# Patient Record
Sex: Female | Born: 1950
Health system: Southern US, Community
[De-identification: ages and names within clinical notes are randomized; demographics above are authoritative.]

## PROBLEM LIST (undated history)

## (undated) DIAGNOSIS — K449 Diaphragmatic hernia without obstruction or gangrene: Secondary | ICD-10-CM

## (undated) DIAGNOSIS — Z9989 Dependence on other enabling machines and devices: Secondary | ICD-10-CM

## (undated) DIAGNOSIS — I428 Other cardiomyopathies: Secondary | ICD-10-CM

## (undated) DIAGNOSIS — R55 Syncope and collapse: Secondary | ICD-10-CM

## (undated) DIAGNOSIS — I872 Venous insufficiency (chronic) (peripheral): Secondary | ICD-10-CM

## (undated) DIAGNOSIS — G43909 Migraine, unspecified, not intractable, without status migrainosus: Secondary | ICD-10-CM

## (undated) DIAGNOSIS — J101 Influenza due to other identified influenza virus with other respiratory manifestations: Secondary | ICD-10-CM

## (undated) DIAGNOSIS — L02419 Cutaneous abscess of limb, unspecified: Secondary | ICD-10-CM

## (undated) DIAGNOSIS — Z9289 Personal history of other medical treatment: Secondary | ICD-10-CM

## (undated) DIAGNOSIS — I495 Sick sinus syndrome: Secondary | ICD-10-CM

## (undated) DIAGNOSIS — L0390 Cellulitis, unspecified: Secondary | ICD-10-CM

## (undated) DIAGNOSIS — R011 Cardiac murmur, unspecified: Secondary | ICD-10-CM

## (undated) DIAGNOSIS — E7800 Pure hypercholesterolemia, unspecified: Secondary | ICD-10-CM

## (undated) DIAGNOSIS — R0600 Dyspnea, unspecified: Secondary | ICD-10-CM

## (undated) DIAGNOSIS — R569 Unspecified convulsions: Secondary | ICD-10-CM

## (undated) DIAGNOSIS — R5383 Other fatigue: Secondary | ICD-10-CM

## (undated) DIAGNOSIS — I10 Essential (primary) hypertension: Secondary | ICD-10-CM

## (undated) DIAGNOSIS — I4891 Unspecified atrial fibrillation: Secondary | ICD-10-CM

## (undated) DIAGNOSIS — Z950 Presence of cardiac pacemaker: Secondary | ICD-10-CM

## (undated) DIAGNOSIS — K219 Gastro-esophageal reflux disease without esophagitis: Secondary | ICD-10-CM

## (undated) DIAGNOSIS — R0609 Other forms of dyspnea: Secondary | ICD-10-CM

## (undated) DIAGNOSIS — M1990 Unspecified osteoarthritis, unspecified site: Secondary | ICD-10-CM

## (undated) DIAGNOSIS — L280 Lichen simplex chronicus: Secondary | ICD-10-CM

## (undated) DIAGNOSIS — Z8489 Family history of other specified conditions: Secondary | ICD-10-CM

## (undated) DIAGNOSIS — I509 Heart failure, unspecified: Secondary | ICD-10-CM

## (undated) DIAGNOSIS — J189 Pneumonia, unspecified organism: Secondary | ICD-10-CM

## (undated) DIAGNOSIS — Z8719 Personal history of other diseases of the digestive system: Secondary | ICD-10-CM

## (undated) DIAGNOSIS — Z9981 Dependence on supplemental oxygen: Secondary | ICD-10-CM

## (undated) DIAGNOSIS — N393 Stress incontinence (female) (male): Secondary | ICD-10-CM

## (undated) DIAGNOSIS — G40409 Other generalized epilepsy and epileptic syndromes, not intractable, without status epilepticus: Secondary | ICD-10-CM

## (undated) DIAGNOSIS — R0602 Shortness of breath: Secondary | ICD-10-CM

## (undated) DIAGNOSIS — I639 Cerebral infarction, unspecified: Secondary | ICD-10-CM

## (undated) DIAGNOSIS — J309 Allergic rhinitis, unspecified: Secondary | ICD-10-CM

## (undated) DIAGNOSIS — G4733 Obstructive sleep apnea (adult) (pediatric): Secondary | ICD-10-CM

## (undated) DIAGNOSIS — D649 Anemia, unspecified: Secondary | ICD-10-CM

## (undated) DIAGNOSIS — L03119 Cellulitis of unspecified part of limb: Secondary | ICD-10-CM

## (undated) DIAGNOSIS — R930 Abnormal findings on diagnostic imaging of skull and head, not elsewhere classified: Secondary | ICD-10-CM

## (undated) DIAGNOSIS — R4182 Altered mental status, unspecified: Secondary | ICD-10-CM

## (undated) DIAGNOSIS — R400 Somnolence: Secondary | ICD-10-CM

## (undated) HISTORY — DX: Shortness of breath: R06.02

## (undated) HISTORY — DX: Gastro-esophageal reflux disease without esophagitis: K21.9

## (undated) HISTORY — DX: Sick sinus syndrome: I49.5

## (undated) HISTORY — DX: Diaphragmatic hernia without obstruction or gangrene: K44.9

## (undated) HISTORY — DX: Altered mental status, unspecified: R41.82

## (undated) HISTORY — DX: Cellulitis, unspecified: L03.90

## (undated) HISTORY — DX: Morbid (severe) obesity due to excess calories: E66.01

## (undated) HISTORY — DX: Somnolence: R40.0

## (undated) HISTORY — DX: Abnormal findings on diagnostic imaging of skull and head, not elsewhere classified: R93.0

## (undated) HISTORY — DX: Other cardiomyopathies: I42.8

## (undated) HISTORY — DX: Stress incontinence (female) (male): N39.3

## (undated) HISTORY — PX: VENTRAL HERNIA REPAIR: SHX424

## (undated) HISTORY — PX: HERNIA REPAIR: SHX51

## (undated) HISTORY — PX: APPENDECTOMY: SHX54

## (undated) HISTORY — DX: Other fatigue: R53.83

## (undated) HISTORY — DX: Venous insufficiency (chronic) (peripheral): I87.2

## (undated) HISTORY — DX: Lichen simplex chronicus: L28.0

## (undated) HISTORY — DX: Allergic rhinitis, unspecified: J30.9

## (undated) HISTORY — DX: Essential (primary) hypertension: I10

---
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
## 1984-12-16 DIAGNOSIS — R930 Abnormal findings on diagnostic imaging of skull and head, not elsewhere classified: Secondary | ICD-10-CM

## 1984-12-16 HISTORY — DX: Abnormal findings on diagnostic imaging of skull and head, not elsewhere classified: R93.0

## 1986-12-16 DIAGNOSIS — I639 Cerebral infarction, unspecified: Secondary | ICD-10-CM

## 1986-12-16 HISTORY — DX: Cerebral infarction, unspecified: I63.9

## 1990-12-16 HISTORY — PX: INSERT / REPLACE / REMOVE PACEMAKER: SUR710

## 1998-04-25 ENCOUNTER — Encounter: Admission: RE | Admit: 1998-04-25 | Discharge: 1998-04-25 | Payer: Self-pay | Admitting: Family Medicine

## 1998-05-28 ENCOUNTER — Emergency Department (HOSPITAL_COMMUNITY): Admission: EM | Admit: 1998-05-28 | Discharge: 1998-05-28 | Payer: Self-pay | Admitting: Emergency Medicine

## 1998-06-06 ENCOUNTER — Encounter: Admission: RE | Admit: 1998-06-06 | Discharge: 1998-06-06 | Payer: Self-pay | Admitting: Family Medicine

## 1998-09-05 ENCOUNTER — Encounter: Admission: RE | Admit: 1998-09-05 | Discharge: 1998-09-05 | Payer: Self-pay | Admitting: Family Medicine

## 1998-10-30 ENCOUNTER — Encounter: Admission: RE | Admit: 1998-10-30 | Discharge: 1998-10-30 | Payer: Self-pay | Admitting: Family Medicine

## 1998-11-13 ENCOUNTER — Encounter: Admission: RE | Admit: 1998-11-13 | Discharge: 1998-11-13 | Payer: Self-pay | Admitting: Family Medicine

## 1998-11-28 ENCOUNTER — Encounter: Admission: RE | Admit: 1998-11-28 | Discharge: 1998-11-28 | Payer: Self-pay | Admitting: Family Medicine

## 1999-03-13 ENCOUNTER — Encounter: Admission: RE | Admit: 1999-03-13 | Discharge: 1999-03-13 | Payer: Self-pay | Admitting: Family Medicine

## 1999-04-10 ENCOUNTER — Encounter: Admission: RE | Admit: 1999-04-10 | Discharge: 1999-04-10 | Payer: Self-pay | Admitting: Family Medicine

## 1999-04-16 ENCOUNTER — Encounter: Admission: RE | Admit: 1999-04-16 | Discharge: 1999-07-15 | Payer: Self-pay | Admitting: Family Medicine

## 1999-08-21 ENCOUNTER — Encounter: Admission: RE | Admit: 1999-08-21 | Discharge: 1999-08-21 | Payer: Self-pay | Admitting: Family Medicine

## 1999-11-20 ENCOUNTER — Encounter: Admission: RE | Admit: 1999-11-20 | Discharge: 1999-11-20 | Payer: Self-pay | Admitting: Family Medicine

## 1999-11-20 ENCOUNTER — Ambulatory Visit (HOSPITAL_COMMUNITY): Admission: RE | Admit: 1999-11-20 | Discharge: 1999-11-20 | Payer: Self-pay | Admitting: Family Medicine

## 1999-12-04 ENCOUNTER — Encounter: Admission: RE | Admit: 1999-12-04 | Discharge: 1999-12-04 | Payer: Self-pay | Admitting: Family Medicine

## 1999-12-18 ENCOUNTER — Encounter: Admission: RE | Admit: 1999-12-18 | Discharge: 1999-12-18 | Payer: Self-pay | Admitting: Family Medicine

## 1999-12-31 ENCOUNTER — Encounter: Payer: Self-pay | Admitting: Family Medicine

## 1999-12-31 ENCOUNTER — Encounter: Admission: RE | Admit: 1999-12-31 | Discharge: 1999-12-31 | Payer: Self-pay | Admitting: Family Medicine

## 2000-05-27 ENCOUNTER — Encounter: Admission: RE | Admit: 2000-05-27 | Discharge: 2000-05-27 | Payer: Self-pay | Admitting: Family Medicine

## 2000-07-16 ENCOUNTER — Encounter: Admission: RE | Admit: 2000-07-16 | Discharge: 2000-07-16 | Payer: Self-pay | Admitting: Family Medicine

## 2000-11-04 ENCOUNTER — Encounter: Admission: RE | Admit: 2000-11-04 | Discharge: 2000-11-04 | Payer: Self-pay | Admitting: Sports Medicine

## 2001-03-03 ENCOUNTER — Encounter: Admission: RE | Admit: 2001-03-03 | Discharge: 2001-03-03 | Payer: Self-pay | Admitting: Family Medicine

## 2001-05-16 HISTORY — PX: LEG SKIN LESION  BIOPSY / EXCISION: SUR473

## 2001-05-19 ENCOUNTER — Encounter: Admission: RE | Admit: 2001-05-19 | Discharge: 2001-05-19 | Payer: Self-pay | Admitting: Family Medicine

## 2001-06-15 ENCOUNTER — Encounter: Admission: RE | Admit: 2001-06-15 | Discharge: 2001-06-15 | Payer: Self-pay | Admitting: Family Medicine

## 2001-06-15 ENCOUNTER — Encounter: Payer: Self-pay | Admitting: Family Medicine

## 2001-06-16 ENCOUNTER — Encounter: Admission: RE | Admit: 2001-06-16 | Discharge: 2001-06-16 | Payer: Self-pay | Admitting: Family Medicine

## 2001-08-04 ENCOUNTER — Encounter: Payer: Self-pay | Admitting: Family Medicine

## 2001-08-04 ENCOUNTER — Encounter: Admission: RE | Admit: 2001-08-04 | Discharge: 2001-08-04 | Payer: Self-pay | Admitting: Family Medicine

## 2001-09-01 ENCOUNTER — Encounter: Admission: RE | Admit: 2001-09-01 | Discharge: 2001-09-01 | Payer: Self-pay | Admitting: Family Medicine

## 2001-12-29 ENCOUNTER — Encounter: Admission: RE | Admit: 2001-12-29 | Discharge: 2001-12-29 | Payer: Self-pay | Admitting: Family Medicine

## 2002-01-12 ENCOUNTER — Encounter: Admission: RE | Admit: 2002-01-12 | Discharge: 2002-01-12 | Payer: Self-pay | Admitting: Family Medicine

## 2002-01-26 ENCOUNTER — Encounter: Admission: RE | Admit: 2002-01-26 | Discharge: 2002-01-26 | Payer: Self-pay | Admitting: Family Medicine

## 2002-02-09 ENCOUNTER — Encounter: Admission: RE | Admit: 2002-02-09 | Discharge: 2002-02-09 | Payer: Self-pay | Admitting: Family Medicine

## 2002-03-23 ENCOUNTER — Encounter: Admission: RE | Admit: 2002-03-23 | Discharge: 2002-03-23 | Payer: Self-pay | Admitting: Family Medicine

## 2002-04-20 ENCOUNTER — Encounter: Admission: RE | Admit: 2002-04-20 | Discharge: 2002-04-20 | Payer: Self-pay | Admitting: Family Medicine

## 2002-06-22 ENCOUNTER — Encounter: Admission: RE | Admit: 2002-06-22 | Discharge: 2002-06-22 | Payer: Self-pay | Admitting: Family Medicine

## 2002-09-16 ENCOUNTER — Emergency Department (HOSPITAL_COMMUNITY): Admission: EM | Admit: 2002-09-16 | Discharge: 2002-09-17 | Payer: Self-pay | Admitting: Emergency Medicine

## 2002-09-16 ENCOUNTER — Encounter: Payer: Self-pay | Admitting: Emergency Medicine

## 2002-11-30 ENCOUNTER — Encounter: Admission: RE | Admit: 2002-11-30 | Discharge: 2002-11-30 | Payer: Self-pay | Admitting: Family Medicine

## 2002-12-28 ENCOUNTER — Encounter: Admission: RE | Admit: 2002-12-28 | Discharge: 2002-12-28 | Payer: Self-pay | Admitting: Family Medicine

## 2002-12-28 ENCOUNTER — Encounter: Payer: Self-pay | Admitting: Family Medicine

## 2003-02-16 ENCOUNTER — Emergency Department (HOSPITAL_COMMUNITY): Admission: EM | Admit: 2003-02-16 | Discharge: 2003-02-16 | Payer: Self-pay | Admitting: Emergency Medicine

## 2003-02-16 ENCOUNTER — Encounter: Payer: Self-pay | Admitting: Emergency Medicine

## 2003-03-01 ENCOUNTER — Encounter: Admission: RE | Admit: 2003-03-01 | Discharge: 2003-03-01 | Payer: Self-pay | Admitting: Family Medicine

## 2003-04-19 ENCOUNTER — Encounter: Admission: RE | Admit: 2003-04-19 | Discharge: 2003-04-19 | Payer: Self-pay | Admitting: Family Medicine

## 2003-07-20 ENCOUNTER — Ambulatory Visit (HOSPITAL_COMMUNITY): Admission: RE | Admit: 2003-07-20 | Discharge: 2003-07-20 | Payer: Self-pay | Admitting: Cardiology

## 2003-07-20 ENCOUNTER — Encounter: Payer: Self-pay | Admitting: Cardiology

## 2003-07-25 HISTORY — PX: INSERT / REPLACE / REMOVE PACEMAKER: SUR710

## 2003-08-09 ENCOUNTER — Encounter: Admission: RE | Admit: 2003-08-09 | Discharge: 2003-08-09 | Payer: Self-pay | Admitting: Family Medicine

## 2003-11-29 ENCOUNTER — Encounter: Admission: RE | Admit: 2003-11-29 | Discharge: 2003-11-29 | Payer: Self-pay | Admitting: Family Medicine

## 2004-02-02 ENCOUNTER — Encounter: Admission: RE | Admit: 2004-02-02 | Discharge: 2004-02-02 | Payer: Self-pay | Admitting: Sports Medicine

## 2004-02-21 ENCOUNTER — Encounter: Admission: RE | Admit: 2004-02-21 | Discharge: 2004-02-21 | Payer: Self-pay | Admitting: Family Medicine

## 2004-03-12 ENCOUNTER — Encounter: Admission: RE | Admit: 2004-03-12 | Discharge: 2004-03-12 | Payer: Self-pay | Admitting: Sports Medicine

## 2004-04-03 ENCOUNTER — Encounter: Admission: RE | Admit: 2004-04-03 | Discharge: 2004-04-03 | Payer: Self-pay | Admitting: Family Medicine

## 2004-07-17 ENCOUNTER — Encounter: Admission: RE | Admit: 2004-07-17 | Discharge: 2004-07-17 | Payer: Self-pay | Admitting: Family Medicine

## 2004-10-16 ENCOUNTER — Encounter (INDEPENDENT_AMBULATORY_CARE_PROVIDER_SITE_OTHER): Payer: Self-pay | Admitting: *Deleted

## 2004-10-16 LAB — CONVERTED CEMR LAB

## 2004-10-30 ENCOUNTER — Encounter: Admission: RE | Admit: 2004-10-30 | Discharge: 2004-10-30 | Payer: Self-pay | Admitting: Family Medicine

## 2004-10-30 ENCOUNTER — Ambulatory Visit: Payer: Self-pay | Admitting: Family Medicine

## 2004-10-30 ENCOUNTER — Encounter: Payer: Self-pay | Admitting: Family Medicine

## 2004-11-13 ENCOUNTER — Ambulatory Visit: Payer: Self-pay | Admitting: Family Medicine

## 2004-11-20 ENCOUNTER — Ambulatory Visit: Payer: Self-pay | Admitting: Sports Medicine

## 2004-12-04 ENCOUNTER — Ambulatory Visit: Payer: Self-pay | Admitting: Family Medicine

## 2005-01-12 ENCOUNTER — Ambulatory Visit: Payer: Self-pay | Admitting: Cardiology

## 2005-02-05 ENCOUNTER — Ambulatory Visit: Payer: Self-pay | Admitting: Family Medicine

## 2005-03-05 ENCOUNTER — Ambulatory Visit: Payer: Self-pay | Admitting: Family Medicine

## 2005-03-21 ENCOUNTER — Ambulatory Visit: Payer: Self-pay | Admitting: Cardiology

## 2005-03-27 ENCOUNTER — Ambulatory Visit (HOSPITAL_COMMUNITY): Admission: RE | Admit: 2005-03-27 | Discharge: 2005-03-27 | Payer: Self-pay | Admitting: *Deleted

## 2005-06-11 ENCOUNTER — Ambulatory Visit: Payer: Self-pay | Admitting: Family Medicine

## 2005-06-26 ENCOUNTER — Encounter: Admission: RE | Admit: 2005-06-26 | Discharge: 2005-06-26 | Payer: Self-pay | Admitting: Family Medicine

## 2005-07-18 ENCOUNTER — Ambulatory Visit: Payer: Self-pay | Admitting: Cardiology

## 2005-07-31 ENCOUNTER — Ambulatory Visit: Payer: Self-pay

## 2005-08-06 ENCOUNTER — Ambulatory Visit: Payer: Self-pay | Admitting: Cardiology

## 2005-08-20 ENCOUNTER — Ambulatory Visit: Payer: Self-pay | Admitting: Family Medicine

## 2005-09-10 ENCOUNTER — Ambulatory Visit: Payer: Self-pay | Admitting: Sports Medicine

## 2005-10-08 ENCOUNTER — Ambulatory Visit: Payer: Self-pay | Admitting: Family Medicine

## 2006-01-07 ENCOUNTER — Ambulatory Visit: Payer: Self-pay | Admitting: Family Medicine

## 2006-03-11 ENCOUNTER — Ambulatory Visit: Payer: Self-pay | Admitting: Family Medicine

## 2006-04-29 ENCOUNTER — Ambulatory Visit: Payer: Self-pay | Admitting: Family Medicine

## 2006-07-16 ENCOUNTER — Ambulatory Visit: Payer: Self-pay | Admitting: Cardiology

## 2006-08-05 ENCOUNTER — Ambulatory Visit: Payer: Self-pay | Admitting: Family Medicine

## 2006-08-26 ENCOUNTER — Ambulatory Visit: Payer: Self-pay | Admitting: Family Medicine

## 2006-09-02 ENCOUNTER — Encounter: Admission: RE | Admit: 2006-09-02 | Discharge: 2006-09-02 | Payer: Self-pay | Admitting: Family Medicine

## 2006-09-30 ENCOUNTER — Ambulatory Visit: Payer: Self-pay | Admitting: Family Medicine

## 2006-11-04 ENCOUNTER — Ambulatory Visit: Payer: Self-pay | Admitting: Family Medicine

## 2006-12-02 ENCOUNTER — Ambulatory Visit: Payer: Self-pay | Admitting: Family Medicine

## 2007-01-22 ENCOUNTER — Ambulatory Visit: Payer: Self-pay | Admitting: Cardiology

## 2007-02-03 ENCOUNTER — Encounter: Payer: Self-pay | Admitting: Cardiology

## 2007-02-03 ENCOUNTER — Ambulatory Visit: Payer: Self-pay | Admitting: Cardiology

## 2007-02-03 ENCOUNTER — Ambulatory Visit: Payer: Self-pay

## 2007-02-03 LAB — CONVERTED CEMR LAB
Basophils Relative: 1.7 % — ABNORMAL HIGH (ref 0.0–1.0)
CO2: 30 meq/L (ref 19–32)
Calcium: 8.9 mg/dL (ref 8.4–10.5)
Chloride: 100 meq/L (ref 96–112)
Creatinine, Ser: 0.8 mg/dL (ref 0.4–1.2)
Eosinophils Relative: 0.6 % (ref 0.0–5.0)
Glucose, Bld: 111 mg/dL — ABNORMAL HIGH (ref 70–99)
HCT: 37.8 % (ref 36.0–46.0)
MCV: 91 fL (ref 78.0–100.0)
Neutrophils Relative %: 69.1 % (ref 43.0–77.0)
Platelets: 128 10*3/uL — ABNORMAL LOW (ref 150–400)
RBC: 4.15 M/uL (ref 3.87–5.11)
RDW: 13.9 % (ref 11.5–14.6)
Sodium: 137 meq/L (ref 135–145)
WBC: 8.8 10*3/uL (ref 4.5–10.5)

## 2007-02-10 ENCOUNTER — Ambulatory Visit: Payer: Self-pay | Admitting: Family Medicine

## 2007-02-10 LAB — CONVERTED CEMR LAB
BUN: 16 mg/dL (ref 6–23)
CO2: 27 meq/L (ref 19–32)
Chloride: 104 meq/L (ref 96–112)
Creatinine, Ser: 0.57 mg/dL (ref 0.40–1.20)
Potassium: 5.2 meq/L (ref 3.5–5.3)

## 2007-02-12 DIAGNOSIS — G4733 Obstructive sleep apnea (adult) (pediatric): Secondary | ICD-10-CM | POA: Insufficient documentation

## 2007-02-12 DIAGNOSIS — G40909 Epilepsy, unspecified, not intractable, without status epilepticus: Secondary | ICD-10-CM | POA: Insufficient documentation

## 2007-02-12 DIAGNOSIS — K449 Diaphragmatic hernia without obstruction or gangrene: Secondary | ICD-10-CM | POA: Insufficient documentation

## 2007-02-12 DIAGNOSIS — G43909 Migraine, unspecified, not intractable, without status migrainosus: Secondary | ICD-10-CM | POA: Insufficient documentation

## 2007-02-12 DIAGNOSIS — K219 Gastro-esophageal reflux disease without esophagitis: Secondary | ICD-10-CM | POA: Insufficient documentation

## 2007-02-12 DIAGNOSIS — N393 Stress incontinence (female) (male): Secondary | ICD-10-CM | POA: Insufficient documentation

## 2007-02-12 DIAGNOSIS — I5022 Chronic systolic (congestive) heart failure: Secondary | ICD-10-CM | POA: Insufficient documentation

## 2007-02-13 ENCOUNTER — Encounter (INDEPENDENT_AMBULATORY_CARE_PROVIDER_SITE_OTHER): Payer: Self-pay | Admitting: *Deleted

## 2007-05-05 ENCOUNTER — Ambulatory Visit: Payer: Self-pay | Admitting: Family Medicine

## 2007-05-05 DIAGNOSIS — L280 Lichen simplex chronicus: Secondary | ICD-10-CM | POA: Insufficient documentation

## 2007-05-05 LAB — CONVERTED CEMR LAB
CO2: 26 meq/L (ref 19–32)
Calcium: 9.3 mg/dL (ref 8.4–10.5)
Chloride: 106 meq/L (ref 96–112)
Creatinine, Ser: 0.72 mg/dL (ref 0.40–1.20)
Glucose, Bld: 103 mg/dL — ABNORMAL HIGH (ref 70–99)
Sodium: 140 meq/L (ref 135–145)

## 2007-05-06 ENCOUNTER — Telehealth: Payer: Self-pay | Admitting: *Deleted

## 2007-07-07 ENCOUNTER — Ambulatory Visit: Payer: Self-pay | Admitting: Family Medicine

## 2007-07-20 ENCOUNTER — Ambulatory Visit: Payer: Self-pay | Admitting: Cardiology

## 2007-07-20 LAB — CONVERTED CEMR LAB
BUN: 14 mg/dL (ref 6–23)
Basophils Absolute: 0 10*3/uL (ref 0.0–0.1)
Basophils Relative: 0.5 % (ref 0.0–1.0)
CO2: 29 meq/L (ref 19–32)
Calcium: 9.1 mg/dL (ref 8.4–10.5)
GFR calc Af Amer: 111 mL/min
GFR calc non Af Amer: 92 mL/min
Hemoglobin: 13.2 g/dL (ref 12.0–15.0)
Lymphocytes Relative: 20.2 % (ref 12.0–46.0)
MCHC: 34.5 g/dL (ref 30.0–36.0)
Monocytes Absolute: 0.7 10*3/uL (ref 0.2–0.7)
Monocytes Relative: 9.3 % (ref 3.0–11.0)
Neutro Abs: 4.8 10*3/uL (ref 1.4–7.7)
Platelets: 137 10*3/uL — ABNORMAL LOW (ref 150–400)
Potassium: 4.2 meq/L (ref 3.5–5.1)

## 2007-08-18 ENCOUNTER — Emergency Department (HOSPITAL_COMMUNITY): Admission: EM | Admit: 2007-08-18 | Discharge: 2007-08-18 | Payer: Self-pay | Admitting: Emergency Medicine

## 2007-10-13 ENCOUNTER — Ambulatory Visit: Payer: Self-pay | Admitting: Family Medicine

## 2007-10-13 LAB — CONVERTED CEMR LAB: Blood Glucose, AC Bkfst: 107 mg/dL

## 2007-11-10 ENCOUNTER — Ambulatory Visit: Payer: Self-pay | Admitting: Family Medicine

## 2008-02-07 ENCOUNTER — Emergency Department (HOSPITAL_COMMUNITY): Admission: EM | Admit: 2008-02-07 | Discharge: 2008-02-07 | Payer: Self-pay | Admitting: Family Medicine

## 2008-02-16 ENCOUNTER — Ambulatory Visit: Payer: Self-pay | Admitting: Family Medicine

## 2008-02-16 ENCOUNTER — Encounter: Payer: Self-pay | Admitting: Family Medicine

## 2008-02-16 LAB — CONVERTED CEMR LAB
ALT: 11 units/L (ref 0–35)
AST: 14 units/L (ref 0–37)
Albumin: 3.8 g/dL (ref 3.5–5.2)
Alkaline Phosphatase: 81 units/L (ref 39–117)
Direct LDL: 118 mg/dL — ABNORMAL HIGH
Potassium: 4.4 meq/L (ref 3.5–5.3)
Sodium: 143 meq/L (ref 135–145)
Total Bilirubin: 0.4 mg/dL (ref 0.3–1.2)
Total Protein: 7.1 g/dL (ref 6.0–8.3)

## 2008-02-17 ENCOUNTER — Encounter: Payer: Self-pay | Admitting: Family Medicine

## 2008-02-29 ENCOUNTER — Telehealth: Payer: Self-pay | Admitting: Family Medicine

## 2008-04-12 ENCOUNTER — Ambulatory Visit: Payer: Self-pay | Admitting: Family Medicine

## 2008-04-29 ENCOUNTER — Encounter: Admission: RE | Admit: 2008-04-29 | Discharge: 2008-04-29 | Payer: Self-pay | Admitting: Family Medicine

## 2008-05-02 ENCOUNTER — Encounter: Payer: Self-pay | Admitting: Family Medicine

## 2008-05-11 ENCOUNTER — Emergency Department (HOSPITAL_COMMUNITY): Admission: EM | Admit: 2008-05-11 | Discharge: 2008-05-11 | Payer: Self-pay | Admitting: Emergency Medicine

## 2008-05-30 ENCOUNTER — Telehealth (INDEPENDENT_AMBULATORY_CARE_PROVIDER_SITE_OTHER): Payer: Self-pay | Admitting: *Deleted

## 2008-05-31 ENCOUNTER — Ambulatory Visit: Payer: Self-pay | Admitting: Family Medicine

## 2008-05-31 DIAGNOSIS — J309 Allergic rhinitis, unspecified: Secondary | ICD-10-CM | POA: Insufficient documentation

## 2008-07-05 ENCOUNTER — Ambulatory Visit: Payer: Self-pay | Admitting: Family Medicine

## 2008-07-05 LAB — CONVERTED CEMR LAB
Albumin: 3.9 g/dL (ref 3.5–5.2)
Alkaline Phosphatase: 96 units/L (ref 39–117)
BUN: 26 mg/dL — ABNORMAL HIGH (ref 6–23)
Creatinine, Ser: 0.59 mg/dL (ref 0.40–1.20)
Glucose, Bld: 115 mg/dL — ABNORMAL HIGH (ref 70–99)
HCT: 41.2 % (ref 36.0–46.0)
Hemoglobin: 13 g/dL (ref 12.0–15.0)
MCHC: 31.6 g/dL (ref 30.0–36.0)
MCV: 95.6 fL (ref 78.0–100.0)
Potassium: 4.2 meq/L (ref 3.5–5.3)
RBC: 4.31 M/uL (ref 3.87–5.11)
RDW: 14.6 % (ref 11.5–15.5)

## 2008-07-07 ENCOUNTER — Encounter: Payer: Self-pay | Admitting: Family Medicine

## 2008-07-25 ENCOUNTER — Encounter: Payer: Self-pay | Admitting: Family Medicine

## 2008-07-26 ENCOUNTER — Ambulatory Visit: Payer: Self-pay | Admitting: Family Medicine

## 2008-08-03 ENCOUNTER — Ambulatory Visit: Payer: Self-pay | Admitting: Cardiology

## 2008-08-03 LAB — CONVERTED CEMR LAB
BUN: 14 mg/dL (ref 6–23)
Basophils Relative: 0 % (ref 0.0–3.0)
CO2: 28 meq/L (ref 19–32)
Chloride: 106 meq/L (ref 96–112)
Creatinine, Ser: 0.7 mg/dL (ref 0.4–1.2)
Eosinophils Relative: 2.3 % (ref 0.0–5.0)
Hemoglobin: 13 g/dL (ref 12.0–15.0)
Lymphocytes Relative: 36.3 % (ref 12.0–46.0)
MCV: 93.8 fL (ref 78.0–100.0)
Neutro Abs: 3.5 10*3/uL (ref 1.4–7.7)
Neutrophils Relative %: 57.1 % (ref 43.0–77.0)
RBC: 4.06 M/uL (ref 3.87–5.11)
WBC: 6.2 10*3/uL (ref 4.5–10.5)

## 2008-12-14 ENCOUNTER — Telehealth: Payer: Self-pay | Admitting: Family Medicine

## 2008-12-30 ENCOUNTER — Encounter: Payer: Self-pay | Admitting: Family Medicine

## 2009-01-23 ENCOUNTER — Ambulatory Visit: Payer: Self-pay | Admitting: Cardiology

## 2009-03-16 ENCOUNTER — Telehealth (INDEPENDENT_AMBULATORY_CARE_PROVIDER_SITE_OTHER): Payer: Self-pay | Admitting: *Deleted

## 2009-04-18 ENCOUNTER — Ambulatory Visit: Payer: Self-pay | Admitting: Family Medicine

## 2009-08-01 ENCOUNTER — Ambulatory Visit: Payer: Self-pay | Admitting: Family Medicine

## 2009-08-02 LAB — CONVERTED CEMR LAB
ALT: 11 units/L (ref 0–35)
AST: 11 units/L (ref 0–37)
Alkaline Phosphatase: 88 units/L (ref 39–117)
Creatinine, Ser: 0.64 mg/dL (ref 0.40–1.20)
HCT: 41.2 % (ref 36.0–46.0)
MCHC: 31.6 g/dL (ref 30.0–36.0)
MCV: 93 fL (ref 78.0–100.0)
Phenytoin Lvl: 8.4 ug/mL — ABNORMAL LOW (ref 10.0–20.0)
Platelets: 159 10*3/uL (ref 150–400)
RDW: 15.1 % (ref 11.5–15.5)
Total Bilirubin: 0.3 mg/dL (ref 0.3–1.2)

## 2009-08-10 ENCOUNTER — Ambulatory Visit: Payer: Self-pay | Admitting: Cardiology

## 2009-08-10 DIAGNOSIS — Z950 Presence of cardiac pacemaker: Secondary | ICD-10-CM | POA: Insufficient documentation

## 2009-08-10 DIAGNOSIS — I428 Other cardiomyopathies: Secondary | ICD-10-CM | POA: Insufficient documentation

## 2009-08-18 ENCOUNTER — Ambulatory Visit: Payer: Self-pay

## 2009-08-18 ENCOUNTER — Encounter: Payer: Self-pay | Admitting: Cardiology

## 2010-01-18 ENCOUNTER — Ambulatory Visit: Payer: Self-pay

## 2010-02-27 ENCOUNTER — Telehealth: Payer: Self-pay | Admitting: Family Medicine

## 2010-03-21 ENCOUNTER — Telehealth: Payer: Self-pay | Admitting: *Deleted

## 2010-05-22 ENCOUNTER — Ambulatory Visit: Payer: Self-pay | Admitting: Family Medicine

## 2010-05-22 LAB — CONVERTED CEMR LAB
BUN: 22 mg/dL (ref 6–23)
Calcium: 8.9 mg/dL (ref 8.4–10.5)
Creatinine, Ser: 0.75 mg/dL (ref 0.40–1.20)
TSH: 0.933 microintl units/mL (ref 0.350–4.500)
Vit D, 25-Hydroxy: <10 ng/mL — ABNORMAL LOW (ref 30–89)

## 2010-05-25 ENCOUNTER — Encounter: Payer: Self-pay | Admitting: Family Medicine

## 2010-09-03 ENCOUNTER — Encounter (INDEPENDENT_AMBULATORY_CARE_PROVIDER_SITE_OTHER): Payer: Self-pay | Admitting: *Deleted

## 2010-09-04 ENCOUNTER — Ambulatory Visit: Payer: Self-pay

## 2010-09-04 ENCOUNTER — Encounter: Payer: Self-pay | Admitting: Cardiology

## 2010-10-12 ENCOUNTER — Encounter (INDEPENDENT_AMBULATORY_CARE_PROVIDER_SITE_OTHER): Payer: Self-pay | Admitting: *Deleted

## 2010-10-21 ENCOUNTER — Encounter: Payer: Self-pay | Admitting: Family Medicine

## 2010-10-23 ENCOUNTER — Telehealth: Payer: Self-pay | Admitting: *Deleted

## 2010-10-24 ENCOUNTER — Encounter: Payer: Self-pay | Admitting: Family Medicine

## 2010-10-24 ENCOUNTER — Ambulatory Visit: Payer: Self-pay | Admitting: Family Medicine

## 2010-10-24 DIAGNOSIS — R0602 Shortness of breath: Secondary | ICD-10-CM | POA: Insufficient documentation

## 2010-10-24 HISTORY — DX: Shortness of breath: R06.02

## 2010-10-24 LAB — CONVERTED CEMR LAB
ALT: 11 units/L (ref 0–35)
AST: 16 units/L (ref 0–37)
Alkaline Phosphatase: 96 units/L (ref 39–117)
Creatinine, Ser: 0.77 mg/dL (ref 0.40–1.20)
HCT: 42.5 % (ref 36.0–46.0)
MCHC: 31.3 g/dL (ref 30.0–36.0)
MCV: 97.9 fL (ref 78.0–100.0)
Phenobarbital: 4.6 ug/mL — ABNORMAL LOW (ref 15.0–40.0)
Phenytoin Lvl: 4.5 ug/mL — ABNORMAL LOW (ref 10.0–20.0)
Platelets: 130 10*3/uL — ABNORMAL LOW (ref 150–400)
Pro B Natriuretic peptide (BNP): 171.2 pg/mL — ABNORMAL HIGH (ref 0.0–100.0)
RDW: 15.4 % (ref 11.5–15.5)
Sodium: 141 meq/L (ref 135–145)
Total Bilirubin: 0.2 mg/dL — ABNORMAL LOW (ref 0.3–1.2)
Total CHOL/HDL Ratio: 2.9
Total Protein: 7 g/dL (ref 6.0–8.3)
WBC: 6.7 10*3/uL (ref 4.0–10.5)

## 2010-10-26 ENCOUNTER — Encounter: Payer: Self-pay | Admitting: Family Medicine

## 2010-11-29 ENCOUNTER — Ambulatory Visit: Payer: Self-pay | Admitting: Cardiology

## 2010-11-29 ENCOUNTER — Encounter: Payer: Self-pay | Admitting: Cardiology

## 2010-12-14 LAB — CONVERTED CEMR LAB
BUN: 28 mg/dL — ABNORMAL HIGH (ref 6–23)
Basophils Relative: 0.8 % (ref 0.0–3.0)
Eosinophils Relative: 1.1 % (ref 0.0–5.0)
GFR calc non Af Amer: 95 mL/min (ref >60.00)
HCT: 41.4 % (ref 36.0–46.0)
Lymphs Abs: 2.1 10*3/uL (ref 0.7–4.0)
Monocytes Relative: 5.7 % (ref 3.0–12.0)
Neutrophils Relative %: 64.4 % (ref 43.0–77.0)
Platelets: 144 10*3/uL — ABNORMAL LOW (ref 150.0–400.0)
Potassium: 4.4 meq/L (ref 3.5–5.1)
RBC: 4.41 M/uL (ref 3.87–5.11)
WBC: 7.4 10*3/uL (ref 4.5–10.5)

## 2011-01-07 ENCOUNTER — Encounter: Payer: Self-pay | Admitting: Family Medicine

## 2011-01-13 LAB — CONVERTED CEMR LAB
BUN: 19 mg/dL (ref 6–23)
Basophils Absolute: 0 10*3/uL (ref 0.0–0.1)
Basophils Relative: 0 % (ref 0.0–3.0)
Cholesterol, target level: 200 mg/dL
Eosinophils Absolute: 0.1 10*3/uL (ref 0.0–0.7)
GFR calc non Af Amer: 90.71 mL/min (ref >60)
HCT: 40.6 % (ref 36.0–46.0)
LDL Goal: 160 mg/dL
Lymphocytes Relative: 39.7 % (ref 12.0–46.0)
MCHC: 33.1 g/dL (ref 30.0–36.0)
MCV: 93.2 fL (ref 78.0–100.0)
Monocytes Absolute: 0.9 10*3/uL (ref 0.1–1.0)
Neutro Abs: 3 10*3/uL (ref 1.4–7.7)
Neutrophils Relative %: 46 % (ref 43.0–77.0)
Potassium: 4.8 meq/L (ref 3.5–5.1)
RDW: 14.2 % (ref 11.5–14.6)
Sodium: 139 meq/L (ref 135–145)
WBC: 6.6 10*3/uL (ref 4.5–10.5)

## 2011-01-17 NOTE — Cardiovascular Report (Signed)
Summary: Office Visit   Office Visit   Imported By: Roderic Ovens 09/14/2010 12:45:41  _____________________________________________________________________  External Attachment:    Type:   Image     Comment:   External Document

## 2011-01-17 NOTE — Miscellaneous (Signed)
Summary: dx correction  Clinical Lists Changes  Problems: Changed problem from PACEMAKER (ICD-V45..01) to PACEMAKER, PERMANENT (ICD-V45.01)  changed the incorrect dx code to correct dx code 

## 2011-01-17 NOTE — Progress Notes (Signed)
Summary: re: labs/ts   ---- Converted from flag ---- ---- 10/22/2010 7:31 PM, Zachery Dauer MD wrote: Please ask her to come in fasting before her morning medications to get level on her seizure medication, and check her sugar and cholesterol. Then schedule an appointment with me 1-2 weeks later to discuss them. called pt. pt scheduled for lab visit tomorrow. pt verbalized understanding of above. pt also reports, that she is OUT OF HER SEIZURE meds tomorrow. I told the pt, that I will inform Dr.Hale about it. fwd. to Dr.Hale.Arlyss Repress CMA,  October 23, 2010 5:35 PM

## 2011-01-17 NOTE — Procedures (Signed)
Summary: device/saf  Medications Added ENALAPRIL MALEATE 20 MG TABS (ENALAPRIL MALEATE) Take 1 by mouth in AM and 1/2 tablet in PM      Allergies Added:   Current Medications (verified): 1)  Enalapril Maleate 20 Mg Tabs (Enalapril Maleate) .... Take 1 By Mouth in Am and 1/2 Tablet in Pm 2)  Furosemide 40 Mg Tabs (Furosemide) .... Take 2  Tablet By Mouth Every Morning 3)  Halobetasol Propionate 0.05 % Oint (Halobetasol Propionate) .... Apply A Small Amount To Skin Twice A Day 4)  Metoprolol Tartrate 50 Mg Tabs (Metoprolol Tartrate) .... Take 1 Tablet By Mouth Twice A Day 5)  Omeprazole 20 Mg Cpdr (Omeprazole) .... Take 1 Tablet Daily 6)  Phenobarbital 32.4 Mg Tabs (Phenobarbital) .... Take 3 Tablet By Mouth As Directed 7)  Phenytoin Sodium Extended 100 Mg Caps (Phenytoin Sodium Extended) .... Take 3 Capsule in Am and 4 in Pm 8)  Spironolactone 50 Mg Tabs (Spironolactone) .Marland Kitchen.. 1 Tablet By Mouth Twice A Day 9)  Therapeutic Multivitamin   Tabs (Multiple Vitamin) .... Take One Tab Daily 10)  Caltrate 600+d Plus 600-400 Mg-Unit  Chew (Calcium Carbonate-Vit D-Min) .... Take One Tablet Twice Daily 11)  Fluticasone Propionate 50 Mcg/act  Susp (Fluticasone Propionate) .... 2 Sprays Each Nostril Once Daily  Allergies (verified): 1)  ! Adult Aspirin Ec Low Strength (Aspirin) 2)  ! Penicillin V Potassium (Penicillin V Potassium)  PPM Specifications Following MD:  Everardo Beals. Juanda Chance, MD     PPM Vendor:  St Jude     PPM Model Number:  [id number]     PPM Serial Number:  [id number] DOI:  07/20/2003     PPM Implanting MD:  Everardo Beals. Juanda Chance, MD  Lead 1    Location: RA     DOI: 03/12/1991     Model #: 1028T     Serial #: [id number]     Status: active Lead 2    Location: RV     DOI: 03/12/1991     Model #: 1216T     Serial #: [id number]     Status: active  Magnet Response Rate:  BOL  98.6 ERI   86.3  Indications:  Sick sinus syndrome  Explantation Comments:  Pacemaker dependent  PPM Follow Up Remote Check?   No Battery Voltage:  2.73 V     Battery Est. Longevity:  1 year     Pacer Dependent:  Yes       PPM Device Measurements Atrium  Impedance: 427 ohms, Threshold: 1.75 V at 0.8 msec Right Ventricle  Amplitude: 5 mV, Impedance: 482 ohms, Threshold: 1.25 V at 0.8 msec  Episodes MS Episodes:  54     Percent Mode Switch:  <1%     Coumadin:  No Atrial Pacing:  95%     Ventricular Pacing:  9.5%  Parameters Mode:  DDDR     Lower Rate Limit:  75     Upper Rate Limit:  110 Paced AV Delay:  275     Sensed AV Delay:  180 Next Cardiology Appt Due:  07/16/2010 Tech Comments:  RA reprogrammed 3.5@0 .8.  Battery estimated longevity 1 year.  ROV 6 months Dr. Juanda Chance. Altha Harm, LPN  January 18, 2010 2:48 PM

## 2011-01-17 NOTE — Letter (Signed)
Summary: Appointment - Missed  [facility], Main Office  [address]   [place], [place] [address]   Phone: [phone]  Fax: [phone]     September 03, 2010 MRN: [id number]   [facility] [address] [place], [place]  [address]   Dear Misty Gray,  Our records indicate you missed your appointment on 07/17/2010 at 11:45am with Dr. Juanda Chance. It is very important that we reach you to reschedule this appointment. We look forward to participating in your health care needs. Please contact us at the number listed above at your earliest convenience to reschedule this appointment.     Sincerely, Neurosurgeon Team LG

## 2011-01-17 NOTE — Procedures (Signed)
Summary: sjm pacer check      Allergies Added:   Current Medications (verified): 1)  Enalapril Maleate 20 Mg Tabs (Enalapril Maleate) .... Take 1 By Mouth in Am and 1/2 Tablet in Pm 2)  Furosemide 40 Mg Tabs (Furosemide) .... Take 2  Tablet By Mouth Every Morning 3)  Halobetasol Propionate 0.05 % Oint (Halobetasol Propionate) .... Apply A Small Amount To Skin Twice A Day 4)  Metoprolol Tartrate 50 Mg Tabs (Metoprolol Tartrate) .... Take 1 Tablet By Mouth Twice A Day 5)  Omeprazole 20 Mg Cpdr (Omeprazole) .... Take 1 Tablet Daily 6)  Phenobarbital 32.4 Mg Tabs (Phenobarbital) .... Take 3 Tablet By Mouth As Directed 7)  Phenytoin Sodium Extended 100 Mg Caps (Phenytoin Sodium Extended) .... Take 3 Capsule in Am and 4 in Pm 8)  Spironolactone 50 Mg Tabs (Spironolactone) .Marland Kitchen.. 1 Tablet By Mouth Twice A Day 9)  Therapeutic Multivitamin   Tabs (Multiple Vitamin) .... Take One Tab Daily 10)  Caltrate 600+d Plus 600-400 Mg-Unit  Chew (Calcium Carbonate-Vit D-Min) .... Take One Tablet Twice Daily 11)  Fluticasone Propionate 50 Mcg/act  Susp (Fluticasone Propionate) .... 2 Sprays Each Nostril Once Daily  Allergies (verified): 1)  ! Adult Aspirin Ec Low Strength (Aspirin) 2)  ! Penicillin V Potassium (Penicillin V Potassium)   PPM Specifications Following MD:  Misty Gray. Juanda Chance, MD     PPM Vendor:  [employer] Jude     PPM Model Number:  [id number]     PPM Serial Number:  [id number] PPM DOI:  07/20/2003     PPM Implanting MD:  Misty Gray. Juanda Chance, MD  Lead 1    Location: RA     DOI: 03/12/1991     Model #: 1028T     Serial #: [id number]     Status: active Lead 2    Location: RV     DOI: 03/12/1991     Model #: 1216T     Serial #: [id number]     Status: active  Magnet Response Rate:  BOL  98.6 ERI   86.3  Indications:  Sick sinus syndrome  Explantation Comments:  Pacemaker dependent  PPM Follow Up Remote Check?  No Battery Voltage:  2.69 V     Battery Est. Longevity:  0.75 years     Pacer Dependent:  Yes       PPM Device  Measurements Atrium  Impedance: 459 ohms, Threshold: 1.25 V at 0.8 msec Right Ventricle  Amplitude: 6.0 mV, Impedance: 488 ohms, Threshold: 1.25 V at 0.8 msec  Episodes MS Episodes:  15     Percent Mode Switch:  <1%     Coumadin:  No Atrial Pacing:  93%     Ventricular Pacing:  4.8%  Parameters Mode:  DDDR     Lower Rate Limit:  75     Upper Rate Limit:  110 Paced AV Delay:  275     Sensed AV Delay:  180 Next Cardiology Appt Due:  11/15/2010 Tech Comments:  RA and RV reprogrammed 2.5@0 .8, atrial sensitivity reprogrammed 0.64mV.  ROV 3 months with Dr. Juanda Chance. Altha Harm, LPN  September 04, 2010 3:22 PM

## 2011-01-17 NOTE — Progress Notes (Signed)
Summary: Recalled medication   Phone Note Call from Patient Call back at Home Phone [phone]   Reason for Call: Talk to Nurse Summary of Call: pt currently prescribed phenobarbitol, sts she received a letter that it has been recalled, wants to know if she needs a new medication?  Initial call taken by: Knox Royalty,  February 27, 2010 2:53 PM  Follow-up for Phone Call        fwd. to Dr.Larrie Lucia for review. Follow-up by: Arlyss Repress CMA,,  February 27, 2010 3:02 PM  Additional Follow-up for Phone Call Additional follow up Details #1::        On Google there is info about Veterinary phenobarbital mislabeling. I left a message on her machine to call the pharmacy and have them call me if any changes are needed so she can get her medication.  Additional Follow-up by: Zachery Dauer MD,  February 27, 2010 4:11 PM

## 2011-01-17 NOTE — Letter (Signed)
Summary: Results Follow-up Letter  [facility]  [address]   [place], [place] [address]   Phone: [phone]  Fax: [phone]    05/25/2010  [address] [place], [place]  [address]  Dear Ms. Lisa,   The following are the results of your recent test(s): Patient: Misty Gray  Tests: (1) Basic Metabolic Panel (57846)   Sodium                    142 mEq/L                   135-145   Potassium                 4.3 mEq/L                   3.5-5.3   Chloride                  106 mEq/L                   96-112   CO2                       25 mEq/L                    19-32   Glucose              [H]  112 mg/dL                   96-29   BUN                       22 mg/dL                    5-28   Creatinine                0.75 mg/dL                  0.40-1.20   Calcium                   8.9 mg/dL                   4.1-32.4  Tests: (2) TSH (23280)   TSH                       0.933 uIU/mL                0.350-4.500     ***Test methodology is 3rd generation TSH***  Your lab tests are good except that your Vitamin D levels are very low. This can make your muscles and bones weak. I am sending a big dose of Vitamin D to your pharmacy to take once weekly for 8 weeks. I'll put you on a lower dose later to take every day.  Tests: (3) Vitamin D (25-Hydroxy) (40102)  Vitamin D (25-Hydroxy)                        [L]  <10 ng/mL                   30-89     This assay accurately quantifies Vitamin D, which is the sum of the     25-Hydroxy forms of Vitamin D2 and D3.  Studies have shown that the     optimum concentration of 25-Hydroxy Vitamin D is 30 ng/mL or higher.      Concentrations of Vitamin D between 20 and 29 ng/mL are considered to     be insufficient and concentrations less than 20 ng/mL are considered     to be deficient for Vitamin D.  Document Creation Date: 05/23/2010 2:26 AM Sincerely,  Zachery Dauer MD [facility]           Appended Document: Vitamin D replacement Medications Added VITAMIN D (ERGOCALCIFEROL) 50000 UNIT CAPS (ERGOCALCIFEROL) Take one weekly for 8 weeks.      I called her as well as sending a letter about the need for Vitamin D replacement    Clinical Lists Changes  Medications: Added new medication of VITAMIN D (ERGOCALCIFEROL) 50000 UNIT CAPS (ERGOCALCIFEROL) Take one weekly for 8 weeks. - Signed Rx of VITAMIN D (ERGOCALCIFEROL) 50000 UNIT CAPS (ERGOCALCIFEROL) Take one weekly for 8 weeks.;  #8 x 0;  Signed;  Entered by: Zachery Dauer MD;  Authorized by: Zachery Dauer MD;  Method used: Electronically to [facility]  [id number] [address] [place], [place], [place]  [address], Ph: [phone], Fax: [phone]    Prescriptions: VITAMIN D (ERGOCALCIFEROL) 50000 UNIT CAPS (ERGOCALCIFEROL) Take one weekly for 8 weeks.  #8 x 0   Entered and Authorized by:   Zachery Dauer MD   Signed by:   Zachery Dauer MD on 05/25/2010   Method used:   Electronically to        [facility]  [address] [id number] (retail)       [address]       [place], [place]  [address]       Ph: [phone]       Fax: [phone]   RxID:   [id number]    Appended Document: Results Follow-up Letter mailed

## 2011-01-17 NOTE — Progress Notes (Signed)
----   Converted from flag ---- ---- 03/20/2010 4:39 PM, Zachery Dauer MD wrote: Please call Misty Gray to schedule an appointment with me which is overdue. ------------------------------  called pt and asked her to call and schedule appt with Dr Sheffield Slider.

## 2011-01-17 NOTE — Miscellaneous (Signed)
Summary: Problem review   Clinical Lists Changes  Problems: Removed problem of PACEMAKER, PERMANENT (ICD-V45.01) Removed problem of SICK SINUS SYNDROME (ICD-427.81) Removed problem of FATIGUE (ICD-780.79) Removed problem of VACCINE AGAINST INFLUENZA (ICD-V04.81) Removed problem of SCREENING FOR MALIGNANT NEOPLASM OF THE CERVIX (ICD-V76.2)

## 2011-01-17 NOTE — Miscellaneous (Signed)
Summary: dx correction   Clinical Lists Changes  Problems: Changed problem from PACEMAKER (ICD-V45.Marland Kitchen01) to PACEMAKER, PERMANENT (ICD-V45.01) - Signed  changed the incorrect dx code to correct dx code

## 2011-01-17 NOTE — Assessment & Plan Note (Signed)
Summary: 3 MONTH ROV  Medications Added OMEPRAZOLE 20 MG CPDR (OMEPRAZOLE) once daily      Allergies Added:   Visit Type:  Follow-up Primary Provider:  Zachery Dauer MD   History of Present Illness: Misty Gray is 60 years old and return for management of her pacemaker and cardiomyopathy. She has sick sinus syndrome and AV block and has a St. Jude identity DD pacemaker implanted. She also has a history of a nonischemic cardiomyopathy but her last ejection fraction in 2008 was 55%.  She says she's been doing fairly well. She's had shortness of breath with exertion but no chest pain no palpitations. The shortness of breath has not changed.  She also has a seizure disorder.  Interrogation of her pacemaker today indicated she has about 4-12 months left on her battery life.  Current Medications (verified): 1)  Enalapril Maleate 20 Mg Tabs (Enalapril Maleate) .... Take 1 By Mouth in Am and 1/2 Tablet in Pm 2)  Furosemide 40 Mg Tabs (Furosemide) .... Take 2  Tablet By Mouth Every Morning 3)  Halobetasol Propionate 0.05 % Oint (Halobetasol Propionate) .... Apply A Small Amount To Skin Twice A Day 4)  Metoprolol Tartrate 50 Mg Tabs (Metoprolol Tartrate) .... Take 1 Tablet By Mouth Twice A Day 5)  Omeprazole 20 Mg Cpdr (Omeprazole) .... Take 1 Tablet Daily 6)  Phenobarbital 32.4 Mg Tabs (Phenobarbital) .... Take 3 Tablet By Mouth As Directed 7)  Phenytoin Sodium Extended 100 Mg Caps (Phenytoin Sodium Extended) .... Take 3 Capsule in Am and 4 in Pm 8)  Spironolactone 50 Mg Tabs (Spironolactone) .Marland Kitchen.. 1 Tablet By Mouth Twice A Day 9)  Therapeutic Multivitamin   Tabs (Multiple Vitamin) .... Take One Tab Daily 10)  Caltrate 600+d Plus 600-400 Mg-Unit  Chew (Calcium Carbonate-Vit D-Min) .... Take One Tablet Twice Daily 11)  Fluticasone Propionate 50 Mcg/act  Susp (Fluticasone Propionate) .... 2 Sprays Each Nostril Once Daily 12)  Omeprazole 20 Mg Cpdr (Omeprazole) .... Once Daily  Allergies  (verified): 1)  ! Adult Aspirin Ec Low Strength (Aspirin) 2)  ! Penicillin V Potassium (Penicillin V Potassium)  Past History:  Past Medical History: Reviewed history from 08/10/2009 and no changes required. 09/30/06 ldl: 97 - 11/7/200 BMI 53.9 ht 60` - 08/16/2005 cardiac Echo - limited study - 08/08/2005 CT - Head - R parietal atrophy 1/86 - 12/16/1984 EEG 5/00 - 04/16/1999 Sleep study - CPAP  TSH normal - 08/27/2006 1. Sick sinus syndrome, status post DDD pacemaker implantation with     generator change with a St. Jude Identity Pacemaker in 2004 with     good pacer function, pacer-dependent. 2. Previous nonischemic cardiomyopathy with improved in ejection     fraction at last echo to 55- 60%. 3. Hypertension. 4. Seizure disorder.   Review of Systems       ROS is negative except as outlined in HPI.   Vital Signs:  Patient profile:   60 year old female Menstrual status:  postmenopausal Height:      60.25 inches Weight:      250 pounds BMI:     48.60 Pulse rate:   75 / minute BP sitting:   102 / 76  (right arm)  Vitals Entered By: Laurance Flatten CMA (November 29, 2010 2:32 PM)  Physical Exam  Additional Exam:  Gen. Well-nourished, in no distress   Neck: No JVD, thyroid not enlarged, no carotid bruits Lungs: No tachypnea, clear without rales, rhonchi or wheezes Cardiovascular: Rhythm regular, PMI not  displaced,  heart sounds  normal, no murmurs or gallops, no peripheral edema, pulses normal in all 4 extremities. Abdomen: BS normal, abdomen soft and non-tender without masses or organomegaly, no hepatosplenomegaly. MS: No deformities, no cyanosis or clubbing   Neuro:  No focal sns   Skin:  no lesions    PPM Specifications Following MD:  Everardo Beals. Juanda Chance, MD     PPM Vendor:  St Jude     PPM Model Number:  [id number]     PPM Serial Number:  [id number] PPM DOI:  07/20/2003     PPM Implanting MD:  Everardo Beals. Juanda Chance, MD  Lead 1    Location: RA     DOI: 03/12/1991     Model #: 1028T     Serial  #: [id number]     Status: active Lead 2    Location: RV     DOI: 03/12/1991     Model #: 1216T     Serial #: [id number]     Status: active  Magnet Response Rate:  BOL  98.6 ERI   86.3  Indications:  Sick sinus syndrome  Explantation Comments:  Pacemaker dependent  PPM Follow Up Battery Voltage:  2.73 V     Battery Est. Longevity:  0.25-1 yr     Pacer Dependent:  Yes       PPM Device Measurements Atrium  Amplitude: PACED mV, Impedance: 456 ohms, Threshold: 1.50 V at 0.8 msec Right Ventricle  Amplitude: 5.0 mV, Impedance: 495 ohms, Threshold: 1.75 V at 0.8 msec  Episodes MS Episodes:  11     Percent Mode Switch:  <1%     Coumadin:  No Ventricular High Rate:  0     Atrial Pacing:  94%     Ventricular Pacing:  2.3%  Parameters Mode:  DDDR     Lower Rate Limit:  75     Upper Rate Limit:  110 Paced AV Delay:  275     Sensed AV Delay:  180 Tech Comments:  CHECKED BY INDUSTRY---11 AMS EPISODES--LONGEST WAS 16 SECONDS.  NORMAL DEVICE FUNCTION.  CHANGED RA OUTPUT FROM 2.50 TO 3.00 AND RV OUTPUT FROM 2.50 TO 2.75 DUE TO THRESHOLDS BEING HIGHER.  ROV IN 3 MTHS. BATTERY LONGEVITY 0.25-1 YR (MAY CHANGE DUE TO INCREASE IN OUTPUTS), Vella Kohler  November 29, 2010 3:47 PM  Impression & Recommendations:  Problem # 1:  PACEMAKER, PERMANENT (ICD-V45.01) She has sick sinus syndrome. Interrogation of her pacemaker showed that she is pacing the atrium and sensing her ventricle almost all the time. She is partially pacer dependent with rates of about 30 and her pacemaker is inhibited. Her longevity is 4-12 months so we'll plan to have her seen in 3 months in the pacemaker clinic.  Problem # 2:  CARDIOMYOPATHY, PRIMARY, DILATED (ICD-425.4) She had a previous cardiomyopathy but her last ejection fraction was normal. She appears euvolemic today. This problem is stable. Her updated medication list for this problem includes:    Enalapril Maleate 20 Mg Tabs (Enalapril maleate) .Marland Kitchen... Take 1 by mouth in am and 1/2  tablet in pm    Furosemide 40 Mg Tabs (Furosemide) .Marland Kitchen... Take 2  tablet by mouth every morning    Metoprolol Tartrate 50 Mg Tabs (Metoprolol tartrate) .Marland Kitchen... Take 1 tablet by mouth twice a day    Spironolactone 50 Mg Tabs (Spironolactone) .Marland Kitchen... 1 tablet by mouth twice a day  Orders: EKG w/ Interpretation (93000) TLB-BMP (Basic Metabolic Panel-BMET) (80048-METABOL) TLB-CBC Platelet - w/Differential (  85025-CBCD)  Patient Instructions: 1)  Labwork today: bmet/cbc (426.0;425.9;402.10). 2)  Your physician recommends that you continue on your current medications as directed. Please refer to the Current Medication list given to you today. 3)  You will need a device check with Paula/ Cristin: in 3 months. 4)  Your physician wants you to follow-up in: 1 year with Dr. Johney Frame.  You will receive a reminder letter in the mail two months in advance. If you don't receive a letter, please call our office to schedule the follow-up appointment.

## 2011-01-17 NOTE — Assessment & Plan Note (Signed)
Summary: f/up,tcb   Vital Signs:  Patient profile:   60 year old female Menstrual status:  postmenopausal Height:      60.25 inches Weight:      253 pounds BMI:     49.18 Temp:     98.9 degrees F oral Pulse rate:   75 / minute BP sitting:   112 / 75  (left arm) Cuff size:   large  Vitals Entered By: Tessie Fass CMA (May 22, 2010 2:19 PM) CC: F/U meds Is Patient Diabetic? No Pain Assessment Patient in pain? no        Primary Care Provider:  Zachery Dauer MD  CC:  F/U meds.  History of Present Illness: No recent seizures. Hasn't seen the neurologists for a long time.   No chest pain but does get dyspnea on exertion on walking short distances. Will see Dr Juanda Chance, her cardiologist this coming August. Not getting much exercise.   Habits & Providers  Alcohol-Tobacco-Diet     Tobacco Status: quit  Current Medications (verified): 1)  Enalapril Maleate 20 Mg Tabs (Enalapril Maleate) .... Take 1 By Mouth in Am and 1/2 Tablet in Pm 2)  Furosemide 40 Mg Tabs (Furosemide) .... Take 2  Tablet By Mouth Every Morning 3)  Halobetasol Propionate 0.05 % Oint (Halobetasol Propionate) .... Apply A Small Amount To Skin Twice A Day 4)  Metoprolol Tartrate 50 Mg Tabs (Metoprolol Tartrate) .... Take 1 Tablet By Mouth Twice A Day 5)  Omeprazole 20 Mg Cpdr (Omeprazole) .... Take 1 Tablet Daily 6)  Phenobarbital 32.4 Mg Tabs (Phenobarbital) .... Take 3 Tablet By Mouth As Directed 7)  Phenytoin Sodium Extended 100 Mg Caps (Phenytoin Sodium Extended) .... Take 3 Capsule in Am and 4 in Pm 8)  Spironolactone 50 Mg Tabs (Spironolactone) .Marland Kitchen.. 1 Tablet By Mouth Twice A Day 9)  Therapeutic Multivitamin   Tabs (Multiple Vitamin) .... Take One Tab Daily 10)  Caltrate 600+d Plus 600-400 Mg-Unit  Chew (Calcium Carbonate-Vit D-Min) .... Take One Tablet Twice Daily 11)  Fluticasone Propionate 50 Mcg/act  Susp (Fluticasone Propionate) .... 2 Sprays Each Nostril Once Daily  Allergies (verified): 1)  ! Adult  Aspirin Ec Low Strength (Aspirin) 2)  ! Penicillin V Potassium (Penicillin V Potassium)  Social History: Lives with husband and son, Randell Patient and son, [name] Husband out of work, diabetic, trailer paid off [name] didn't get disability yet, diabetic Cumi Sanagustin, daughter and, 3 boys ages 77, 42, and 77, all staying with her She has disability Homemaker, 4 children [employer] grad [employer] Non-smoker Non-drinker [employer] Status:  quit  Physical Exam  General:  alert and oriented. Pleasanly joking as usual.  morbidly obese.  Poor hygeine. Lungs:  Normal respiratory effort, chest expands symmetrically. Lungs are clear to auscultation, no crackles or wheezes. Heart:  Normal rate and regular rhythm with occasional skip. S1 and S2 normal without gallop, murmur, click, rub or other extra sounds. Abdomen:  Very obese, soft with abdominal scar(s) and thickening of wall from hernia surgeries with mesh placement.   Extremities:  2+ left pedal edema and 3 + right pedal edema.   Skin:  Mild rythema left shin about 6 x 8 cm smooth Few papules left wrist Psych:  normally interactive, good eye contact, not anxious appearing, and not depressed appearing.     Impression & Recommendations:  Problem # 1:  OBESITY, MORBID (ICD-278.01) Assessment Unchanged  Problem # 2:  CARDIOMYOPATHY, PRIMARY, DILATED (ICD-425.4) Assessment: Unchanged  Orders: [facility]- Est Level  3 (95621)  Problem # 3:  FATIGUE (ICD-780.79)  Orders: Basic Met-[facility] (30865-78469) Vit D, 25 [facility] (62952-84132) TSH-[facility] (44010-27253)  Problem # 4:  GASTROESOPHAGEAL REFLUX, NO ESOPHAGITIS (ICD-530.81) Assessment: Improved  Her updated medication list for this problem includes:    Omeprazole 20 Mg Cpdr (Omeprazole) .Marland Kitchen... Take 1 tablet daily  Complete Medication List: 1)  Enalapril Maleate 20 Mg Tabs (Enalapril maleate) .... Take 1 by mouth in am and 1/2 tablet in pm 2)  Furosemide 40 Mg Tabs  (Furosemide) .... Take 2  tablet by mouth every morning 3)  Halobetasol Propionate 0.05 % Oint (Halobetasol propionate) .... Apply a small amount to skin twice a day 4)  Metoprolol Tartrate 50 Mg Tabs (Metoprolol tartrate) .... Take 1 tablet by mouth twice a day 5)  Omeprazole 20 Mg Cpdr (Omeprazole) .... Take 1 tablet daily 6)  Phenobarbital 32.4 Mg Tabs (Phenobarbital) .... Take 3 tablet by mouth as directed 7)  Phenytoin Sodium Extended 100 Mg Caps (Phenytoin sodium extended) .... Take 3 capsule in am and 4 in pm 8)  Spironolactone 50 Mg Tabs (Spironolactone) .Marland Kitchen.. 1 tablet by mouth twice a day 9)  Therapeutic Multivitamin Tabs (Multiple vitamin) .... Take one tab daily 10)  Caltrate 600+d Plus 600-400 Mg-unit Chew (Calcium carbonate-vit d-min) .... Take one tablet twice daily 11)  Fluticasone Propionate 50 Mcg/act Susp (Fluticasone propionate) .... 2 sprays each nostril once daily  Patient Instructions: 1)  Please schedule a follow-up appointment in 3 months .  2)  Limit your Sodium(salt) .  3)  Eat whole wheat cereal without sugar or high fructose corn syrup. 4)  You will walk mornings three days weekly toward mailbox 5 minutes time. Increase by one minute each week.  Prescriptions: ENALAPRIL MALEATE 20 MG TABS (ENALAPRIL MALEATE) Take 1 by mouth in AM and 1/2 tablet in PM  #45 Tablet x 11   Entered and Authorized by:   Zachery Dauer MD   Signed by:   Zachery Dauer MD on 05/22/2010   Method used:   Electronically to        [id number] ([facility])       [address]       [place], [place]  [address]       Ph: [phone]       Fax: [phone]   RxID:   [id number]

## 2011-01-17 NOTE — Letter (Signed)
Summary: Results Follow-up Letter  [facility]  [address]   [place], [place] [address]   Phone: [phone]  Fax: [phone]    10/26/2010  [address] [place], [place]  [address]  Dear [name],   The following are the results of your recent test(s): I tried to call but it didn't go through.  Patient: Misty Gray  Tests: (1) CMP with Estimated GFR (2402)   Sodium                    141 mEq/L                   135-145   Potassium                 4.0 mEq/L                   3.5-5.3   Chloride                  106 mEq/L                   96-112   CO2                       27 mEq/L                    19-32   Glucose                   84 mg/dL                    57-84   BUN                  [H]  24 mg/dL                    6-96   Creatinine                0.77 mg/dL                  0.40-1.20   Bilirubin, Total     [L]  0.2 mg/dL                   2.9-5.2   Alkaline Phosphatase      96 U/L                      39-117   AST/SGOT                  16 U/L                      0-37   ALT/SGPT                  11 U/L                      0-35   Total Protein             7.0 g/dL                    8.4-1.3   Albumin                   3.8 g/dL  3.5-5.2   Calcium                   9.2 mg/dL                   1.6-10.9 Your kidney and liver function are good  Tests: (2) CBC NO Diff (Complete Blood Count) (10000)   WBC                       6.7 K/uL                    4.0-10.5   RBC                       4.34 MIL/uL                 3.87-5.11   Hemoglobin                13.3 g/dL                   60.4-54.0   Hematocrit                42.5 %                      36.0-46.0   MCV                       97.9 fL                     78.0-100.0 ! MCH                       30.6 pg                     26.0-34.0   MCHC                      31.3 g/dL                   98.1-19.1   RDW                       15.4 %                      11.5-15.5  Platelet Count       [L]  130 K/uL                    150-400  Tests: (3) Lipid Profile (47829)   Cholesterol               168 mg/dL                   5-621     ATP III Classification:           < 200        mg/dL        Desirable          200 - 239     mg/dL        Borderline High          >= 240        mg/dL        High         Triglyceride         [  H]  226 mg/dL                   <161   HDL Cholesterol           57 mg/dL                    >09   Total Chol/HDL Ratio      2.9 Ratio  VLDL Cholesterol (Calc)                        [H]  45 mg/dL                    6-04  LDL Cholesterol (Calc)                             66 mg/dL                    5-40           Total Cholesterol/HDL Ratio:CHD Risk                            Coronary Heart Disease Risk Table                                            Men       Women              1/2 Average Risk              3.4        3.3                  Average Risk              5.0        4.4              2 X Average Risk              9.6        7.1              3 X Average Risk             23.4       11.0     Use the calculated Patient Ratio above and the CHD Risk table      to determine the patient's CHD Risk.     ATP III Classification (LDL):           < 100        mg/dL         Optimal          100 - 129     mg/dL         Near or Above Optimal          130 - 159     mg/dL         Borderline High          160 - 189     mg/dL         High           > 190        mg/dL  Very High        Tests: (4) Phenytoin (Dilantin) (04540)   Phenytoin (Dilantin) [L]  4.5 ug/mL                   10.0-20.0  Tests: (5) Phenobarbital (23460)   Phenobarbital        [L]  4.6 ug/mL                   15.0-40.0 Please make sure you are taking the full dosing of the Dilantin and Phenobarbital because low doses make you more likely to have a seizure  Tests: (6) B Natriuretic Peptide (98119)  B Natriuretic Peptide                        [H]  171.2 pg/mL                  0.0-100.0 Not in much heart failure currently by this test Document Creation Date: 10/25/2010 2:03 PM  Zachery Dauer MD [facility]           Appended Document: Results Follow-up Letter mailed

## 2011-02-26 ENCOUNTER — Encounter: Payer: Self-pay | Admitting: Family Medicine

## 2011-02-26 ENCOUNTER — Ambulatory Visit: Payer: Self-pay | Admitting: Family Medicine

## 2011-02-28 ENCOUNTER — Encounter (INDEPENDENT_AMBULATORY_CARE_PROVIDER_SITE_OTHER): Payer: Medicare Other

## 2011-02-28 ENCOUNTER — Encounter: Payer: Self-pay | Admitting: Internal Medicine

## 2011-02-28 DIAGNOSIS — I495 Sick sinus syndrome: Secondary | ICD-10-CM

## 2011-03-05 NOTE — Procedures (Signed)
Summary: device check/sl  mca      Allergies Added:   Current Medications (verified): 1)  Enalapril Maleate 20 Mg Tabs (Enalapril Maleate) .... Take 1 By Mouth in Am and 1/2 Tablet in Pm 2)  Furosemide 40 Mg Tabs (Furosemide) .... Take 2  Tablet By Mouth Every Morning 3)  Halobetasol Propionate 0.05 % Oint (Halobetasol Propionate) .... Apply A Small Amount To Skin Twice A Day 4)  Metoprolol Tartrate 50 Mg Tabs (Metoprolol Tartrate) .... Take 1 Tablet By Mouth Twice A Day 5)  Omeprazole 20 Mg Cpdr (Omeprazole) .... Take 1 Tablet Daily 6)  Phenobarbital 32.4 Mg Tabs (Phenobarbital) .... Take 3 Tablet By Mouth As Directed 7)  Phenytoin Sodium Extended 100 Mg Caps (Phenytoin Sodium Extended) .... Take 3 Capsule in Am and 4 in Pm 8)  Spironolactone 50 Mg Tabs (Spironolactone) .Marland Kitchen.. 1 Tablet By Mouth Twice A Day 9)  Therapeutic Multivitamin   Tabs (Multiple Vitamin) .... Take One Tab Daily 10)  Caltrate 600+d Plus 600-400 Mg-Unit  Chew (Calcium Carbonate-Vit D-Min) .... Take One Tablet Twice Daily 11)  Fluticasone Propionate 50 Mcg/act  Susp (Fluticasone Propionate) .... 2 Sprays Each Nostril Once Daily  Allergies (verified): 1)  ! Adult Aspirin Ec Low Strength (Aspirin) 2)  ! Penicillin V Potassium (Penicillin V Potassium)  PPM Specifications Following MD:  Everardo Beals. Juanda Chance, MD     PPM Vendor:  St Jude     PPM Model Number:  [phone]     PPM Serial Number:  [id number] PPM DOI:  07/20/2003     PPM Implanting MD:  Everardo Beals. Juanda Chance, MD  Lead 1    Location: RA     DOI: 03/12/1991     Model #: [id number]     Serial #: [id number]     Status: active Lead 2    Location: RV     DOI: 03/12/1991     Model #: [id number]     Serial #: [id number]     Status: active  Magnet Response Rate:  BOL  98.6 ERI   86.3  Indications:  Sick sinus syndrome  Explantation Comments:  Pacemaker dependent  PPM Follow Up Pacer Dependent:  Yes      Episodes Coumadin:  No  Parameters Mode:  DDDR     Lower Rate Limit:  75     Upper Rate  Limit:  110 Paced AV Delay:  275     Sensed AV Delay:  180 Tech Comments:  See PaceArt

## 2011-03-14 NOTE — Cardiovascular Report (Signed)
Summary: Office Visit   Office Visit   Imported By: Roderic Ovens 03/05/2011 14:30:00  _____________________________________________________________________  External Attachment:    Type:   Image     Comment:   External Document

## 2011-04-02 ENCOUNTER — Encounter: Payer: Self-pay | Admitting: Family Medicine

## 2011-04-02 ENCOUNTER — Ambulatory Visit (INDEPENDENT_AMBULATORY_CARE_PROVIDER_SITE_OTHER): Payer: Medicare Other | Admitting: Family Medicine

## 2011-04-02 VITALS — BP 113/73 | HR 65 | Ht 60.0 in | Wt 248.9 lb

## 2011-04-02 DIAGNOSIS — R0602 Shortness of breath: Secondary | ICD-10-CM

## 2011-04-02 DIAGNOSIS — L280 Lichen simplex chronicus: Secondary | ICD-10-CM

## 2011-04-02 DIAGNOSIS — R569 Unspecified convulsions: Secondary | ICD-10-CM

## 2011-04-02 DIAGNOSIS — I428 Other cardiomyopathies: Secondary | ICD-10-CM

## 2011-04-02 DIAGNOSIS — I5022 Chronic systolic (congestive) heart failure: Secondary | ICD-10-CM

## 2011-04-02 DIAGNOSIS — Z950 Presence of cardiac pacemaker: Secondary | ICD-10-CM

## 2011-04-02 NOTE — Patient Instructions (Signed)
Schedule in Geriatric clinic in May for pap smear and lab tests.   Schedule your visit in the early AM before you eat or take your morning pills.

## 2011-04-02 NOTE — Progress Notes (Signed)
  Subjective:    Patient ID: Misty Gray, female    DOB: 1951-03-29, 59 y.o.   MRN: [id number]  HPI Misty Gray says she's been feeling fairly well but not had any recent office visit. However she's been told her pacemaker battery is getting low and we'll it will need to be replaced she has an appointment with Dr. Johney Frame in May to discuss this.  She has had 2 seizures recently which occurred while she was sitting. As usual it sounded like people are talking in a tunnel and her own speech sounded like she was drunk. it improves after deep breathing and is associated with headache and nausea. she has been taking her anti convulsive medications.  He does have pains in her knees. Occasionally has to use the steroid cream on her left shin.  She has her usual dyspnea on exertion. She sleeps on 2 pillows.  She's been stressed by her family LS including heart attack and her brother-in-law.  Her husband has had a sleep study and has had some trouble controlling his blood sugars.  Having trouble cutting her toenails would like to see podiatrist for this.  Has frequent urination but no burning or fever   Review of Systems  Constitutional: Negative for unexpected weight change.  Respiratory: Positive for shortness of breath.   Cardiovascular: Negative for chest pain and leg swelling.  Genitourinary: Negative for vaginal bleeding and difficulty urinating.  Musculoskeletal: Positive for arthralgias.  Neurological: Positive for seizures.       Objective:   Physical Exam  Constitutional: She is oriented to person, place, and time. She appears well-developed. No distress.       Morbidly obese. Made it difficult for her to get up onto the examination table.   Cardiovascular: Normal rate and regular rhythm.   Pulmonary/Chest: Effort normal and breath sounds normal.  Abdominal: Soft.       Very obese, surgical scars and thickened subcutaneous tissue from mesh placement for recurrent hernias.     Neurological: She is alert and oriented to person, place, and time. No cranial nerve deficit.  Skin:          Chronic confluent papular dermatitis of anterior left lower leg. Faint dermatitis there on R leg.   Psychiatric: She has a normal mood and affect. Her behavior is normal. Judgment and thought content normal.       Her usual jovial self. 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           Assessment & Plan:

## 2011-04-03 NOTE — Assessment & Plan Note (Signed)
unchanged

## 2011-04-03 NOTE — Assessment & Plan Note (Signed)
Less well controlled recently. Will check levels next month. Hasn't seen the neurologist for some time.

## 2011-04-03 NOTE — Assessment & Plan Note (Signed)
Well controlled with intermittent steroid cream

## 2011-04-03 NOTE — Assessment & Plan Note (Signed)
For replacement soon because battery failing

## 2011-04-03 NOTE — Assessment & Plan Note (Signed)
Chronic due to obesity and poor conditioning

## 2011-04-03 NOTE — Assessment & Plan Note (Signed)
Stable

## 2011-04-25 ENCOUNTER — Ambulatory Visit (INDEPENDENT_AMBULATORY_CARE_PROVIDER_SITE_OTHER): Payer: Medicare Other | Admitting: Family Medicine

## 2011-04-25 ENCOUNTER — Encounter: Payer: Self-pay | Admitting: Family Medicine

## 2011-04-25 ENCOUNTER — Other Ambulatory Visit (HOSPITAL_COMMUNITY)
Admission: RE | Admit: 2011-04-25 | Discharge: 2011-04-25 | Disposition: A | Discharge status: Home / Self Care | Payer: Medicare Other | Source: Ambulatory Visit | Attending: Family Medicine | Admitting: Family Medicine

## 2011-04-25 VITALS — BP 114/78 | HR 67 | Temp 98.4°F | Ht 60.0 in | Wt 242.0 lb

## 2011-04-25 DIAGNOSIS — I5022 Chronic systolic (congestive) heart failure: Secondary | ICD-10-CM

## 2011-04-25 DIAGNOSIS — I428 Other cardiomyopathies: Secondary | ICD-10-CM

## 2011-04-25 DIAGNOSIS — R569 Unspecified convulsions: Secondary | ICD-10-CM

## 2011-04-25 DIAGNOSIS — Z01419 Encounter for gynecological examination (general) (routine) without abnormal findings: Secondary | ICD-10-CM

## 2011-04-25 DIAGNOSIS — Z124 Encounter for screening for malignant neoplasm of cervix: Secondary | ICD-10-CM | POA: Insufficient documentation

## 2011-04-25 DIAGNOSIS — Z950 Presence of cardiac pacemaker: Secondary | ICD-10-CM

## 2011-04-25 LAB — COMPREHENSIVE METABOLIC PANEL
AST: 16 U/L (ref 0–37)
Albumin: 4.1 g/dL (ref 3.5–5.2)
Alkaline Phosphatase: 91 U/L (ref 39–117)
Potassium: 4.4 mEq/L (ref 3.5–5.3)
Sodium: 137 mEq/L (ref 135–145)
Total Protein: 7.3 g/dL (ref 6.0–8.3)

## 2011-04-25 NOTE — Patient Instructions (Signed)
Return to see Dr Sheffield Slider in 6 months.  I will call if there are any problems with your laboratory tests.

## 2011-04-25 NOTE — Assessment & Plan Note (Signed)
No clinical indications of CHF exacerbation

## 2011-04-25 NOTE — Assessment & Plan Note (Signed)
Bimanual exam very limited by obesity. Cervix clinically normal.

## 2011-04-25 NOTE — Progress Notes (Signed)
  Subjective:    Patient ID: Misty Gray, female    DOB: [birth year], 60 y.o.   MRN: [id number]  HPI    Review of Systems     Objective:   Physical Exam    .bal    Assessment & Plan:

## 2011-04-25 NOTE — Assessment & Plan Note (Addendum)
Controlled on medications. Admits that she sometimes misses doses. Will check serum levels of dilantin and phenobarbital for routine surveillance.   Dilantin level came back 30. She certainly did not appear ataxic or have nystagmus. I called her and she confirmed that she hadn't taken the Dilantin any later than 1 AM the night before and none that AM afterward. I instructed her to decrease the PM dose to 3 capsule and to continue the 3 capsules each AM. She is to come in for a Dilantin level in 1-2 weeks done before her AM dose.

## 2011-04-25 NOTE — Assessment & Plan Note (Signed)
No symptoms. Will have a pacer check later this month

## 2011-04-25 NOTE — Progress Notes (Signed)
  Subjective:    Patient ID: Misty Gray, female    DOB: [birth year], 60 y.o.   MRN: [id number]  HPI  1. Sz disorder. No episodes of seizure since last visit. Last episode lasted 25 minutes and patient braced herself on kitchen counter to prevent falling. She mentates during the seizures and can do breathing exercises until it passes. Hands shake. Is taking anti-sz medications dilantin and phenobarbital as prescribed. Onset in her 49s after birth of first child. Pathology is attributed to head injury as a 1-yr-old girl.   2. Cardiomyopathy/pacemaker. Scheduled to f/u with cardiologist in near future for pacemaker maintenance. Has baseline exertional SOB that is unchanged. No increased leg swelling, denies chest pain, palpitations.   3. OSA. Using CPAP nightly as instructed. No complaints.  4. Venous insufficiency. No increased rashes, swelling, or skin breakdown/ulcers. Is taking lasix daily.  5. GERD. No symptoms after starting medication.   6. Obesity. Difficulty with weight loss since being retired from work. Unable to walk for exercise.   Review of Systems  Constitutional: Negative for activity change.  Respiratory: Positive for apnea and shortness of breath.   Cardiovascular: Negative for chest pain and leg swelling.  Genitourinary: Negative for vaginal bleeding and vaginal discharge.  Musculoskeletal: Positive for gait problem. Negative for arthralgias.  Neurological: Positive for seizures. Negative for tremors.   See HPI. Denies headaches, visual changes, increased dyspnea, abnormal vaginal bleeding, blood in stool. Endorses consistent polyuria with diuretic.     Objective:   Physical Exam  Vitals reviewed. Constitutional: She is oriented to person, place, and time. She appears well-developed and well-nourished.  HENT:  Head: Normocephalic and atraumatic.  Eyes: EOM are normal.  Neck: Neck supple. No thyromegaly present.  Cardiovascular: Normal rate and regular rhythm.     Pulmonary/Chest: Effort normal and breath sounds normal.       Pacemaker palpable under scar R upper medial chest  Abdominal: Soft. There is no tenderness.       Wide midline surgical scar above and below umbilicus. Firm wall due to underlying mesh.  Genitourinary: No breast swelling, tenderness or discharge. Pelvic exam was performed with patient supine.  Musculoskeletal: She exhibits no edema.       Bilateral chronic vascular congestion/varicosities in LEs  Neurological: She is alert and oriented to person, place, and time. No cranial nerve deficit. Coordination normal.       Steady romberg,   Skin: Rash noted. There is erythema.     Psychiatric: She has a normal mood and affect. Her behavior is normal. Thought content normal.     Balance Abnormal Patient value  Sitting balance  Stable  Arise  Normal posture  Attempts to arise  WNL  Immediate standing balance x Ankles approximated, but feet turned outwards.   Standing balance  Normal  Nudge  WNL  Eyes closed  Stable  360 degree turn    Sitting down     Gait Abnormal Patient value  Initiation of gait  WNL  Step length-left  WNL  Step length-right  WNL  Step height-left  WNL  Step height-right  WNL  Step symmetry x Left hip appears higher than right  Step continuity  WNL  Path  WNL  Trunk x No scoliosis, cervical kyphosis and lumbar lordosis  Walking stance x Kyphotic, head forward      Assessment & Plan:

## 2011-04-26 LAB — PHENYTOIN LEVEL, TOTAL: Phenytoin Lvl: 30.6 ug/mL — ABNORMAL HIGH (ref 10.0–20.0)

## 2011-04-26 LAB — PHENOBARBITAL LEVEL: Phenobarbital: 13.3 ug/mL — ABNORMAL LOW (ref 15.0–40.0)

## 2011-04-26 MED ORDER — PHENYTOIN SODIUM EXTENDED 100 MG PO CAPS: 300.0000 mg | ORAL_CAPSULE | Freq: Two times a day (BID) | ORAL | Status: DC

## 2011-04-26 MED ORDER — PHENYTOIN SODIUM EXTENDED 100 MG PO CAPS: ORAL_CAPSULE | ORAL | Status: DC

## 2011-04-26 NOTE — Progress Notes (Signed)
Addended by: Zachery Dauer on: 04/26/2011 04:15 PM   Modules accepted: Orders

## 2011-04-30 NOTE — Assessment & Plan Note (Signed)
[facility]                            CARDIOLOGY OFFICE NOTE   Misty Gray, Misty Gray                        MRN:          [id number]  DATE:07/20/2007                            DOB:          04-02-1951    PRIMARY CARE PHYSICIAN:  Dr. Zachery Dauer.   CLINICAL HISTORY:  Misty Gray is 60 years old and has nonischemic  cardiomyopathy, sick sinus syndrome, and is status post DD pacemaker  implantation.  She had a generator change in 1992 with a St. Jude  Identity pacemaker implanted.   She has been doing quite well, has had no recent chest pain, shortness  of breath, or palpitations.  She does say she has gained some weight.  We did an echocardiogram after her last visit in February of this year,  and her left ventricular function had improved to an ejection fraction  of 55% to 60%.   PAST MEDICAL HISTORY:  Significant for hypertension and a seizure  disorder and GERD.   CURRENT MEDICATIONS:  1. Enalapril.  2. Furosemide.  3. Dilantin.  4. Prilosec.  5. Metoprolol.  6. Phenobarbital.  7. Oxygen.   PHYSICAL EXAMINATION:  The blood pressure was 120/80 and the pulse 76  and regular.  There was no venous distension.  The carotid pulses were full without  bruits.  CHEST:  Clear.  CARDIAC:  Rhythm was regular.  I could hear no murmurs or gallops.  ABDOMEN:  Soft without organomegaly.  Peripheral pulses were full and there was trace peripheral edema.   We interrogated her pacemaker, and she had no P-wave since she had  atrial standstill.  Nevertheless, she did have some episodes of high  atrial rates.  Her thresholds were good on both leads and her impedances  were good on both leads.  She was ventricular pacing only 3% of the  time.   IMPRESSION:  1. Sick sinus syndrome, status post DD pacemaker implantation in 1992      with generator change for a St. Jude Identity pacemaker in 2004,      now stable with good pacer function.  2. Nonischemic  cardiomyopathy and ejection fraction of 35% to 40%, now      improved to 55% to 60%, possible rate related cardiomyopathy.  3. Hypertension.  4. Seizure disorder.   RECOMMENDATIONS:  I think Misty Gray is doing quite well.  Will plan to  get a CBC and BMP on her today.  Since her function is normal, I will  plan to see her back on a yearly basis unless she has problems in the  interim.  She will see Dr. Sheffield Slider in followup.     Bruce Elvera Lennox Juanda Chance, MD, [facility]  Electronically Signed    BRB/MedQ  DD: 07/20/2007  DT: 07/20/2007  Job #: [id number]

## 2011-04-30 NOTE — Assessment & Plan Note (Signed)
[facility] HEALTHCARE                            CARDIOLOGY OFFICE NOTE   Misty Gray, Misty Gray                        MRN:          [id number]  DATE:08/03/2008                            DOB:          05-22-1951    PRIMARY CARE PHYSICIAN:  Wayne A. Sheffield Slider, MD, at [facility] from [facility].   CLINICAL HISTORY:  Misty Gray is 60 years old and has a history of a  nonischemic cardiomyopathy, sick sinus syndrome and prior DDD pacemaker  implantation.  She had a generator change in 1992 with a St. Copywriter, advertising.   She says she has been doing quite well and has had no recent chest pain  or palpitations.  She does have chronic shortness of breath.  She had an  echocardiogram done last year in February 2008, which showed her  ejection fraction had improved to 55-60%.   PAST MEDICAL HISTORY:  Significant for hypertension and seizure disorder  and GERD.   CURRENT MEDICATIONS:  1. Enalapril 20 mg in the morning and 10 mg in the evening.  2. Spirolactone 50 mg b.i.d.  3. Oxygen.  4. Furosemide 40 mg in the morning and 20 mg in the afternoon.  5. Prilosec.  6. Metoprolol 50 mg b.i.d.  7. Phenobarbital.   PHYSICAL EXAMINATION:  VITAL SIGNS:  The blood pressure is 114/70 and  the pulse 75 and regular.  NECK:  There was no venous distension.  The carotid pulses were full  without bruits.  CHEST:  Clear without rales or rhonchi.  HEART:  Rhythm was regular.  No murmurs or gallops.  ABDOMEN:  Soft without organomegaly.  EXTREMITIES:  Peripheral pulses were full with no peripheral edema.   Electrocardiogram showed atrial pacing with intrinsic conduction.   IMPRESSION:  1. Sick sinus syndrome, status post DDD pacemaker implantation with      generator change with a St. Jude Identity Pacemaker in 2004 with      good pacer function, pacer-dependent.  2. Previous nonischemic cardiomyopathy with improved in ejection      fraction at last echo to 55- 60%.  3.  Hypertension.  4. Seizure disorder.   RECOMMENDATIONS:  I think, Misty Gray is doing well.  We will get a CBC  and BMP today.  She may be a little cut back on some her medications,  especially the spironolactone, but I will  wait until we get her labs before we make a final decision about that.  We will plan to check her pacer in 6 months and I will see her back in a  year.     Bruce Elvera Lennox Juanda Chance, MD, [facility]  Electronically Signed    BRB/MedQ  DD: 08/03/2008  DT: 08/04/2008  Job #: [id number]

## 2011-05-03 NOTE — Assessment & Plan Note (Signed)
[facility] HEALTHCARE                            CARDIOLOGY OFFICE NOTE   Misty Gray, Misty Gray                        MRN:          [id number]  DATE:01/22/2007                            DOB:          11-20-1951    PRIMARY CARE PHYSICIAN:  Dr. Deniece Portela A. Sheffield Slider - [facility]   PAST CLINICAL HISTORY:  Misty Gray is 60 years old and has a  nonischemic cardiomyopathy with injection fraction of 35% to 45%.  She  also has a DDD pacemaker for sick sinus syndrome.  In 1992, she had a  paragon tube implanted and in 2004, she had a St. Jude Identity  implanted.   She says she has been doing fairly well.  She says she has had no chest  pain, no palpitations and only a mild shortness of breath. She does not  feel like she has had any fluid retention.   PAST MEDICAL HISTORY:  Is significant for hypertension and a seizure  disorder.  She also has GERD.   CURRENT MEDICATIONS:  Include: Enalapril, furosemide, multivitamins,  spironolactone, Dilantin, Prilosec, metoprolol and phenobarbital.   EXAMINATION:  The blood pressure is 146/77, the pulse 68 and regular.  There was no venous distention. Carotid pulses were full without bruits.  CHEST:  Was clear.  CARDIAC:  Rhythm was regular.  I could hear no murmurs or gallops.  ABDOMEN:  Was soft with normal bowel sounds.  There was trace peripheral edema and pedal pulses were equal.   Interrogation of pacemaker showed good thresholds in both the atrial and  ventricular leads and good lead impedances.  She had mode switches but  they were all very short lived of less than 1 minute. She was pacing the  atrium but sensing on the ventricle almost exclusively.   IMPRESSION:  1. Non-ischemic cardiomyopathy with ejection fraction 35% to 40%.  2. Class II congestive heart failure.  3. Sick sinus syndrome status post pacemaker implantation in 1992 with      a generator change for a St. Jude Identity pacemaker in 2004, now  stable with good pacer functions.  4. Hypertension.  5. Seizure disorder.   RECOMMENDATIONS:  Misty Gray is doing well.  Her blood pressure is not  optimally controlled, and she is not on b.i.d. enalapril for her left  ventricular dysfunction and heart failure so we will change enalapril  from 20 mg a day to 20 mg in the morning and 10 mg in the afternoon or  evening.  We will have her come back next week for a Chem-7, CBC and a  BNP.  We will also get a 2-D echo to reevaluate LV function since she has not  had this done since 2006.  I will plan to see her back in followup in 6  months.     Bruce Elvera Lennox Juanda Chance, MD, [facility]  Electronically Signed    BRB/MedQ  DD: 01/22/2007  DT: 01/23/2007  Job #: [id number]

## 2011-05-03 NOTE — Cardiovascular Report (Signed)
NAME:  Misty Gray, Misty Gray                           ACCOUNT NO.:  [id number]   MEDICAL RECORD NO.:  [id number]                   PATIENT TYPE:  OIB   LOCATION:  2888                                 FACILITY:  [facility]   PHYSICIAN:  Charlies Constable, M.D.                  DATE OF BIRTH:  1951-03-03   DATE OF PROCEDURE:  07/20/2003  DATE OF DISCHARGE:  07/20/2003                              CARDIAC CATHETERIZATION   CLINICAL HISTORY:  [name] is 59 years old and had a Paragon II pacemaker  implanted for sick sinus syndrome in 1992.  She also has hypertension and  idiopathic cardiomyopathy with an ejection fraction of 35-40%.  She recently  reached end-of-life for her pacemaker and is brought in for elective  generator replacement.  Her lead parameters were good on last interrogation  in the office.  She was pacer dependent due to atrial __________ fell down  to a rate of 30.   PROCEDURE:  1. Explantation of the old Paragon II DV generator (model [phone] serial     [id number]).  2. Inspection of the old atrial lead Industrial/product designer model [id number] serial     [id number]) and the Pacesetter ventricular lead (model [id number] serial     [id number]).  3. Insertion of a new Identity ADXXL DDDR pacemaker (model [id number] serial     [id number]).   ANESTHESIA:  1% local Xylocaine.   ESTIMATED BLOOD LOSS:  Less than 10 mL.   COMPLICATIONS:  None.   PROCEDURE:  The procedure was performed in laboratory room #4.  The right  anterior chest was prepped and draped in usual fashion.  The skin and  subcutaneous tissue were anesthetized with 1% local Xylocaine.  An incision  was made below the old incision over the pacemaker pocket and extended down  to the pocket.  The pocket was opened and the generator was removed.  The  leads were inspected and thresholds were measured as described below.  The  pocket was irrigated with sterile kanamycin solution.  The old leads were  then attached to the new generator with the hexnut wrench.   The generator  was planted into the pocket and secured loosely to the posterior aspect of  the fossa with 1-0 silk.  The subcutaneous tissue was closed with running 2-  0 Dexon.  The skin was closed with running 5-0 Dexon.   PACING PARAMETERS:  Atrium:  P-wave 5.6 millivolts unipolar and 5.5 volts  bipolar.  Impendence was 763 unipolar and 865 bipolar, threshold 1.6  unipolar and 1.7 volts bipolar.   Ventricular:  R-wave was 5.6 millivolts unipolar and 8.9 millivolts bipolar.  The impendence was 490 ohms unipolar and 564 ohms bipolar and the threshold  was 1.2 volts unipolar and 1.4 volts bipolar.   The patient tolerated procedure well and left the laboratory in satisfactory  condition with atrial pacing.  Charlies Constable, M.D.    BB/MEDQ  D:  07/20/2003  T:  07/21/2003  Job:  [id number]   cc:   Deniece Portela A. Sheffield Slider, M.D.  [address] [place]  [place] [address]  Fax: [phone]   [facility]

## 2011-05-03 NOTE — Assessment & Plan Note (Signed)
[facility] HEALTHCARE                              CARDIOLOGY OFFICE NOTE   Misty Gray, Misty Gray                        MRN:          [id number]  DATE:07/16/2006                            DOB:          [birth year]    PRIMARY CARE PHYSICIAN:  Misty A. Sheffield Slider, MD.   CLINICAL HISTORY:  Misty Gray returned for a follow-up pacemaker and  cardiac check today.  Misty Gray is 60 years old and has a nonischemic  cardiomyopathy with ejection fraction of 35-45% and has DDD pacemaker  implanted with a generator change in 2004 for sick sinus syndrome.  Her  current device is a Advertising account executive.   She has been doing fairly well.  She said she had two seizures recently.  She says she had some edema and she does have shortness of breath with  exertion but this has not changed.  She has had no chest pain.   PAST MEDICAL HISTORY:  1.  Hypertension.  2.  Seizure disorder.   SOCIAL HISTORY:  She lives at home with her husband and she is disabled.   CURRENT MEDICATIONS:  1.  Enalapril.  2.  Oxygen.  3.  Furosemide.  4.  Metoprolol.  5.  Phenobarbital.  6.  Spironolactone.  7.  Dilantin.  8.  Prilosec.   PHYSICAL EXAMINATION:  VITAL SIGNS:  Blood pressure 128/86 and pulse 74 and  regular.  NECK:  The venous pulsation was visible 3 cm above the clavicle.  The  carotid pulses were full.  CHEST:  Clear without rales or rhonchi.  CARDIOVASCULAR:  Cardiac rhythm was regular.  I could hear no significant  murmur.  Heart sounds were very distant.  ABDOMEN:  Protuberant.  I could feel no hepatosplenomegaly.  EXTREMITIES:  There was 2+ peripheral edema.   IMPRESSION:  1.  Congestive heart failure with some increased volume overload.  2.  Nonischemic cardiomyopathy with an ejection fraction of 35-40%.  3.  Sick sinus syndrome status post DDD pacemaker implantation with a new      St. Jude Identity device implanted in 2004 with good pacer function.  4.   Hypertension.  5.  Seizure disorder.   RECOMMENDATIONS:  We interrogated her pacemaker today and she has good  thresholds and good lead impedances.  She is atrial pacing and ventricular  sensing almost all of the time.  She does seem to have some volume overload  and we will increase her Lasix from 40 mg tablets two in the morning to 40  mg tablets two in the morning and one in the afternoon.  We will get a BMP  today and in a week.  She is to see Misty Gray in two weeks and he can decide  about further adjustments.  She also plans to see Misty Gray about her  recent seizure activity.  I will plan to see her back for a pacer check in  six months.  Misty Elvera Lennox Juanda Chance, MD, [facility]    BRB/MedQ  DD:  07/16/2006  DT:  07/16/2006  Job #:  [id number]   cc:   Misty Portela A. Sheffield Slider, MD

## 2011-05-07 ENCOUNTER — Encounter: Payer: Self-pay | Admitting: Internal Medicine

## 2011-05-08 ENCOUNTER — Encounter: Payer: Self-pay | Admitting: Internal Medicine

## 2011-05-08 ENCOUNTER — Ambulatory Visit (INDEPENDENT_AMBULATORY_CARE_PROVIDER_SITE_OTHER): Payer: Medicare Other | Admitting: Internal Medicine

## 2011-05-08 ENCOUNTER — Encounter: Payer: Self-pay | Admitting: *Deleted

## 2011-05-08 DIAGNOSIS — I428 Other cardiomyopathies: Secondary | ICD-10-CM

## 2011-05-08 DIAGNOSIS — Z950 Presence of cardiac pacemaker: Secondary | ICD-10-CM

## 2011-05-08 DIAGNOSIS — R0602 Shortness of breath: Secondary | ICD-10-CM

## 2011-05-08 DIAGNOSIS — I5022 Chronic systolic (congestive) heart failure: Secondary | ICD-10-CM

## 2011-05-08 DIAGNOSIS — I495 Sick sinus syndrome: Secondary | ICD-10-CM | POA: Insufficient documentation

## 2011-05-08 NOTE — Progress Notes (Signed)
The patient presents today for routine electrophysiology followup.  Since last being seen by Dr Juanda Chance, the patient reports doing very well.  Today, she denies symptoms of palpitations, chest pain, shortness of breath, orthopnea, PND, lower extremity edema, dizziness, presyncope, syncope, or neurologic sequela.  The patient feels that she is tolerating medications without difficulties and is otherwise without complaint today.   Past Medical History  Diagnosis Date  . Abnormal CT of the head 12/16/1984    R parietal atrophy  . Sleep apnea, obstructive     CPAP started  . Nonischemic cardiomyopathy     EF has normalized  . Sick sinus syndrome     WITH PRIOR DDD PACEMAKER IMPLANTATION  . Hypertension   . GERD (gastroesophageal reflux disease)   . Seizure disorder   . Unspecified venous (peripheral) insufficiency   . Migraine, unspecified, without mention of intractable migraine without mention of status migrainosus   . Diaphragmatic hernia without mention of obstruction or gangrene   . Female stress incontinence   . Allergic rhinitis, cause unspecified   . Morbid obesity   . Lichenification and lichen simplex chronicus    Past Surgical History  Procedure Date  . Pacemaker insertion 12/16/1990    DDD EF 30%  . Ventral hernia repair 04/15/1998    Repeat  . Leg skin lesion  biopsy / excision 05/16/2001    Lichen Planus  . Pacemaker insertion 07/25/2003    replaced by BB, leads are both IS1 and from 1992    Current Outpatient Prescriptions  Medication Sig Dispense Refill  . enalapril (VASOTEC) 20 MG tablet Take 1 tablet by mouth in AM and 1/2 tablet by mouth in PM      . fluticasone (FLONASE) 50 MCG/ACT nasal spray 2 sprays by Nasal route daily.       . furosemide (LASIX) 40 MG tablet Take 80 mg by mouth every morning.       . halobetasol (ULTRAVATE) 0.05 % ointment Apply a small amount to skin twice a day       . metoprolol (LOPRESSOR) 50 MG tablet Take 50 mg by mouth 2 (two) times daily.        Marland Kitchen omeprazole (PRILOSEC) 20 MG capsule Take 20 mg by mouth daily.       Marland Kitchen PHENobarbital (LUMINAL) 32.4 MG tablet Take 3 tablet by mouth as directed      . spironolactone (ALDACTONE) 50 MG tablet Take 50 mg by mouth 2 (two) times daily.       . Calcium Carbonate-Vitamin D (CALTRATE 600+D) 600-400 MG-UNIT per chew tablet Chew 1 tablet by mouth 2 (two) times daily.       . multivitamin (THERAGRAN) tablet Take 1 tablet by mouth daily.       . phenytoin (DILANTIN) 100 MG ER capsule Take 300 mg by mouth. Take 3 capsules by mouth every morning and 3 capsules by mouth at night       . DISCONTD: phenytoin (DILANTIN) 100 MG ER capsule Take 3 capsules (300 mg total) by mouth 2 (two) times daily. Take 3 capsules by mouth every morning and 4 capsules by mouth at night  180 capsule  12  . DISCONTD: phenytoin (DILANTIN) 100 MG ER capsule Take 3 caps twice daily   180 capsule  11    Allergies  Allergen Reactions  . Aspirin     REACTION: urticaria  . Penicillins     REACTION: sweating, told she had a reaction in the hospital  History   Social History  . Marital Status: Married    Spouse Name: [name]    Number of Children: 4  . Years of Education: 12   Occupational History  . disabled    Social History Main Topics  . Smoking status: Former Games developer  . Smokeless tobacco: Never Used  . Alcohol Use: No  . Drug Use: No  . Sexually Active: Not on file   Other Topics Concern  . Not on file   Social History Narrative   [employer] - [employer] - [name] lives with themDaughter, [name], and her 3 children live with them    Family History  Problem Relation Age of Onset  . Stroke Father     died from CAD?  Marland Kitchen Cancer Mother     melanoma  . Diabetes Son     Type 1  . Sleep apnea Son   . Cancer Sister     cervical  . Hypertension Sister   . Hypertension Brother   . Rheum arthritis Daughter     Also PGM, PGGM    ROS-  All systems are reviewed and are negative except as  outlined in the HPI above    Physical Exam: Filed Vitals:   05/08/11 1422  BP: 100/70  Pulse: 68  Height: 5\' 2"  (1.575 m)  Weight: 247 lb (112.038 kg)    GEN- The patient is obese, alert and oriented x 3 today.   Head- normocephalic, atraumatic Eyes-  Sclera clear, conjunctiva pink Ears- hearing intact Oropharynx- clear Neck- supple, no JVP Lymph- no cervical lymphadenopathy Lungs- Clear to ausculation bilaterally, normal work of breathing Chest- R sided pacemaker pocket is well healed Heart- Regular rate and rhythm, no murmurs, rubs or gallops, PMI not laterally displaced GI- soft, NT, ND, + BS Extremities- no clubbing, cyanosis, or edema MS- no significant deformity or atrophy Skin- no rash or lesion Psych- euthymic mood, full affect Neuro- strength and sensation are intact  Pacemaker interrogation- reviewed in detail today,  See PACEART report  Assessment and Plan:

## 2011-05-08 NOTE — Assessment & Plan Note (Signed)
Normal pacemaker function She has reached ERI battery status today R/B/A to PPM pulse generator replacement were discussed at length with the patient today.  She understands risks and wishes to proceed.  We will therefore schedule PPM gen change at the next available time.  See Arita Miss Art report

## 2011-05-08 NOTE — Patient Instructions (Signed)
Need to have your generator changed in your pacemaker

## 2011-05-08 NOTE — Assessment & Plan Note (Signed)
Stable No change required today  

## 2011-05-08 NOTE — Assessment & Plan Note (Signed)
Resolved No changes today

## 2011-05-14 ENCOUNTER — Other Ambulatory Visit (INDEPENDENT_AMBULATORY_CARE_PROVIDER_SITE_OTHER): Payer: Medicare Other | Admitting: *Deleted

## 2011-05-14 DIAGNOSIS — I495 Sick sinus syndrome: Secondary | ICD-10-CM

## 2011-05-14 DIAGNOSIS — I10 Essential (primary) hypertension: Secondary | ICD-10-CM

## 2011-05-14 LAB — BASIC METABOLIC PANEL
BUN: 33 mg/dL — ABNORMAL HIGH (ref 6–23)
CO2: 29 mEq/L (ref 19–32)
Calcium: 8.8 mg/dL (ref 8.4–10.5)
Creatinine, Ser: 0.9 mg/dL (ref 0.4–1.2)

## 2011-05-14 LAB — CBC WITH DIFFERENTIAL/PLATELET
Basophils Absolute: 0 10*3/uL (ref 0.0–0.1)
HCT: 39 % (ref 36.0–46.0)
Hemoglobin: 13.3 g/dL (ref 12.0–15.0)
Lymphs Abs: 1.9 10*3/uL (ref 0.7–4.0)
MCHC: 34 g/dL (ref 30.0–36.0)
Monocytes Relative: 8.8 % (ref 3.0–12.0)
Neutro Abs: 2.8 10*3/uL (ref 1.4–7.7)
RDW: 14.3 % (ref 11.5–14.6)

## 2011-05-14 LAB — PROTIME-INR: INR: 1 ratio (ref 0.8–1.0)

## 2011-05-21 ENCOUNTER — Ambulatory Visit (HOSPITAL_COMMUNITY)
Admission: RE | Admit: 2011-05-21 | Discharge: 2011-05-21 | Disposition: A | Discharge status: Home / Self Care | Payer: Medicare Other | Source: Ambulatory Visit | Attending: Internal Medicine | Admitting: Internal Medicine

## 2011-05-21 ENCOUNTER — Ambulatory Visit (HOSPITAL_COMMUNITY): Payer: Medicare Other

## 2011-05-21 DIAGNOSIS — Z45018 Encounter for adjustment and management of other part of cardiac pacemaker: Secondary | ICD-10-CM | POA: Insufficient documentation

## 2011-05-21 DIAGNOSIS — Z01818 Encounter for other preprocedural examination: Secondary | ICD-10-CM | POA: Insufficient documentation

## 2011-05-21 DIAGNOSIS — I495 Sick sinus syndrome: Secondary | ICD-10-CM

## 2011-05-21 DIAGNOSIS — Z01810 Encounter for preprocedural cardiovascular examination: Secondary | ICD-10-CM | POA: Insufficient documentation

## 2011-05-21 HISTORY — PX: INSERT / REPLACE / REMOVE PACEMAKER: SUR710

## 2011-05-27 NOTE — Op Note (Signed)
NAMESHIRRELL, SOLINGER                 ACCOUNT NO.:  [id number]  MEDICAL RECORD NO.:  [id number]  LOCATION:  [facility]                         FACILITY:  [facility]  PHYSICIAN:  [name], MD       DATE OF BIRTH:  10-Nov-1951  DATE OF PROCEDURE: DATE OF DISCHARGE:  05/21/2011                              OPERATIVE REPORT   SURGEON:  [name], MD  PREPROCEDURE DIAGNOSES: 1. Sinus node dysfunction. 2. Pacemaker at elective replacement indicator.  POSTPROCEDURE DIAGNOSES: 1. Sinus node dysfunction. 2. Pacemaker at elective replacement indicator.  PROCEDURES:  Pacemaker pulse generator replacement with skin pocket revision.  INTRODUCTION:  Ms. Mckimmy is a pleasant 60 year old female with a history of sinus node dysfunction, status post prior pacemaker implantation by Dr. Juanda Chance.  She has recently reached elective replacement indicator. She therefore presents today for pacemaker pulse generator replacement.  DESCRIPTION OF PROCEDURE:  Informed written consent was obtained and the patient was brought to the Electrophysiology Lab in the fasting state. Her pacemaker was interrogated and confirmed to be at [facility] battery status.  Her right chest was prepped and draped in the usual sterile fashion by the EP Lab staff.  The skin overlying her existing pacemaker was infiltrated with lidocaine for local analgesia.  A 4-cm incision was made over her existing pacemaker pocket.  The pacemaker was exposed using a combination of Ranes and blunt dissection.  Electrocautery was required to assure hemostasis.  There was no foreign matter or debris within the pocket.  The pacemaker was carefully removed from the pocket and disconnected from the leads.  Atrial lead was confirmed to be a model 21308 - 52 (serial number [id number]) lead implanted on February 11, 1991.  Her ventricular lead was confirmed to be a model 65784 - 60 (serial number [id number]) lead implanted on February 11, 1991.  Both leads were examined  carefully and their integrity was confirmed to be intact. Initial atrial lead P-waves could not be detected that she was device dependent at heart rates greater than 30 beats per minute.  The atrial lead impedance measured 423 ohms unipolar with a pacing threshold of 1.7 volts at 0.5 milliseconds unipolar and 2.3 volts at 0.5 milliseconds bipolar.  The bipolar impedance was 506 ohms.  Right ventricular lead R- waves measured 4.4 millivolts with an impedance of 486 ohms and a threshold of 1.5 volts at 0.5 milliseconds.  Both leads were therefore connected to a St. Jude Medical Accent DR RF, model O1478969 (serial number [id number]) pacemaker.  The pocket was then revised to accommodate the new and bigger device.  The pocket was then irrigated with copious gentamicin solution.  The pacemaker was then placed into the pocket. The pocket was then closed in two layers with 2-0 Vicryl suture for the subcutaneous and subcuticular layers.  Steri-Strips and sterile dressing were then applied.  There were no early apparent complications.  CONCLUSIONS: 1. Successful pacemaker pulse generator replacement with an Accent DR     RF dual-chamber pacemaker for sinus node dysfunction and ERI     battery status. 2. No early apparent complications.     [name], MD     JA/MEDQ  D:  05/21/2011  T:  05/22/2011  Job:  [id number]  cc:   Deniece Portela A. Sheffield Slider, M.D.  Electronically Signed by [name] MD on 05/27/2011 09:20:12 AM

## 2011-05-30 ENCOUNTER — Ambulatory Visit (INDEPENDENT_AMBULATORY_CARE_PROVIDER_SITE_OTHER): Payer: Medicare Other | Admitting: *Deleted

## 2011-05-30 DIAGNOSIS — I495 Sick sinus syndrome: Secondary | ICD-10-CM

## 2011-05-30 NOTE — Progress Notes (Signed)
Pacer check in clinic  

## 2011-06-06 ENCOUNTER — Other Ambulatory Visit: Payer: Self-pay | Admitting: *Deleted

## 2011-06-06 MED ORDER — METOPROLOL TARTRATE 50 MG PO TABS: 50.0000 mg | ORAL_TABLET | Freq: Two times a day (BID) | ORAL | Status: DC

## 2011-08-17 ENCOUNTER — Other Ambulatory Visit: Payer: Self-pay | Admitting: Family Medicine

## 2011-08-17 DIAGNOSIS — I5022 Chronic systolic (congestive) heart failure: Secondary | ICD-10-CM

## 2011-08-18 NOTE — Telephone Encounter (Signed)
Refill request

## 2011-08-20 ENCOUNTER — Other Ambulatory Visit: Payer: Self-pay | Admitting: Family Medicine

## 2011-08-20 MED ORDER — PHENOBARBITAL 32.4 MG PO TABS: 97.2000 mg | ORAL_TABLET | Freq: Every day | ORAL | Status: DC

## 2011-09-09 ENCOUNTER — Encounter: Payer: Self-pay | Admitting: Internal Medicine

## 2011-09-09 ENCOUNTER — Ambulatory Visit (INDEPENDENT_AMBULATORY_CARE_PROVIDER_SITE_OTHER): Payer: Medicare Other | Admitting: Internal Medicine

## 2011-09-09 VITALS — BP 102/70 | HR 70 | Ht 62.0 in | Wt 249.0 lb

## 2011-09-09 DIAGNOSIS — I495 Sick sinus syndrome: Secondary | ICD-10-CM

## 2011-09-09 LAB — PACEMAKER DEVICE OBSERVATION
AL THRESHOLD: 1.5 V
BAMS-0001: 180 {beats}/min
BAMS-0003: 80 {beats}/min
DEVICE MODEL PM: 7254513
RV LEAD THRESHOLD: 1.625 V

## 2011-09-09 NOTE — Progress Notes (Signed)
The patient presents today for routine electrophysiology followup.  Since her recent generator change, the patient reports doing very well.  Today, she denies symptoms of palpitations, chest pain, shortness of breath, orthopnea, PND, lower extremity edema, dizziness, presyncope, syncope, or neurologic sequela.  The patient feels that she is tolerating medications without difficulties and is otherwise without complaint today.   Past Medical History  Diagnosis Date  . Abnormal CT of the head 12/16/1984    R parietal atrophy  . Sleep apnea, obstructive     CPAP started  . Nonischemic cardiomyopathy     EF has normalized  . Sick sinus syndrome     WITH PRIOR DDD PACEMAKER IMPLANTATION  . Hypertension   . GERD (gastroesophageal reflux disease)   . Seizure disorder   . Unspecified venous (peripheral) insufficiency   . Migraine, unspecified, without mention of intractable migraine without mention of status migrainosus   . Diaphragmatic hernia without mention of obstruction or gangrene   . Female stress incontinence   . Allergic rhinitis, cause unspecified   . Morbid obesity   . Lichenification and lichen simplex chronicus    Past Surgical History  Procedure Date  . Pacemaker insertion 12/16/1990    DDD EF 30%  . Ventral hernia repair 04/15/1998    Repeat  . Leg skin lesion  biopsy / excision 05/16/2001    Lichen Planus  . Pacemaker insertion 07/25/2003    replaced by BB, leads are both IS1 and from 1992, Most recent Gen Change by [facility] 05/21/11    Current Outpatient Prescriptions  Medication Sig Dispense Refill  . Calcium Carbonate-Vitamin D (CALTRATE 600+D) 600-400 MG-UNIT per chew tablet Chew 1 tablet by mouth 2 (two) times daily.       . enalapril (VASOTEC) 20 MG tablet TAKE 1 TABLET EVERY MORNING AND 1/2 TABLET INTHE EVENING  45 tablet  5  . fluticasone (FLONASE) 50 MCG/ACT nasal spray 2 sprays by Nasal route daily.       . furosemide (LASIX) 40 MG tablet Take 80 mg by mouth every morning.        . halobetasol (ULTRAVATE) 0.05 % ointment Apply a small amount to skin twice a day       . metoprolol (LOPRESSOR) 50 MG tablet Take 1 tablet (50 mg total) by mouth 2 (two) times daily.  60 tablet  6  . multivitamin (THERAGRAN) tablet Take 1 tablet by mouth daily.       Marland Kitchen omeprazole (PRILOSEC) 20 MG capsule TAKE 1 CAPSULE EVERY DAY  30 capsule  4  . PHENobarbital (LUMINAL) 32.4 MG tablet Take 3 tablets (97.2 mg total) by mouth at bedtime. Take 3 tablet by mouth as directed  90 tablet  5  . phenytoin (DILANTIN) 100 MG ER capsule Take 300 mg by mouth. Take 3 capsules by mouth every morning and 3 capsules by mouth at night       . spironolactone (ALDACTONE) 50 MG tablet TAKE 1 TABLET BY MOUTH TWICE A DAY  60 tablet  5    Allergies  Allergen Reactions  . Aspirin     REACTION: urticaria  . Penicillins     REACTION: sweating, told she had a reaction in the hospital    History   Social History  . Marital Status: Married    Spouse Name: [name]    Number of Children: 4  . Years of Education: 12   Occupational History  . disabled    Social History Main Topics  .  Smoking status: Former Games developer  . Smokeless tobacco: Never Used  . Alcohol Use: No  . Drug Use: No  . Sexually Active: Not on file   Other Topics Concern  . Not on file   Social History Narrative   [employer] - [employer] - [name] lives with themDaughter, [name], and her 3 children live with them    Family History  Problem Relation Age of Onset  . Stroke Father     died from CAD?  Marland Kitchen Cancer Mother     melanoma  . Diabetes Son     Type 1  . Sleep apnea Son   . Cancer Sister     cervical  . Hypertension Sister   . Hypertension Brother   . Rheum arthritis Daughter     Also PGM, PGGM     Physical Exam: Filed Vitals:   09/09/11 1042  BP: 102/70  Pulse: 70  Height: 5\' 2"  (1.575 m)  Weight: 249 lb (112.946 kg)    GEN- The patient is obese, alert and oriented x 3 today.   Head- normocephalic,  atraumatic Eyes-  Sclera clear, conjunctiva pink Ears- hearing intact Oropharynx- clear Neck- supple, no JVP Lymph- no cervical lymphadenopathy Lungs- Clear to ausculation bilaterally, normal work of breathing Chest- R sided pacemaker pocket is well healed Heart- Regular rate and rhythm, no murmurs, rubs or gallops, PMI not laterally displaced GI- soft, NT, ND, + BS Extremities- no clubbing, cyanosis, or edema MS- no significant deformity or atrophy Skin- no rash or lesion Psych- euthymic mood, full affect Neuro- strength and sensation are intact  Pacemaker interrogation- reviewed in detail today,  See PACEART report  Assessment and Plan:

## 2011-09-09 NOTE — Patient Instructions (Signed)
Your physician wants you to follow-up in: 9 months with Dr Allred You will receive a reminder letter in the mail two months in advance. If you don't receive a letter, please call our office to schedule the follow-up appointment.  

## 2011-09-09 NOTE — Assessment & Plan Note (Signed)
Normal pacemaker function See Pace Art report No changes today  

## 2011-09-11 LAB — POCT I-STAT, CHEM 8
BUN: 15
Calcium, Ion: 1.16
Creatinine, Ser: 0.8
Glucose, Bld: 109 — ABNORMAL HIGH
TCO2: 27

## 2011-09-11 LAB — CBC
Platelets: 148 — ABNORMAL LOW
WBC: 9

## 2011-09-11 LAB — DIFFERENTIAL
Basophils Absolute: 0
Lymphocytes Relative: 8 — ABNORMAL LOW
Lymphs Abs: 0.7
Neutrophils Relative %: 84 — ABNORMAL HIGH

## 2011-09-24 ENCOUNTER — Ambulatory Visit (INDEPENDENT_AMBULATORY_CARE_PROVIDER_SITE_OTHER): Payer: Medicare Other | Admitting: Family Medicine

## 2011-09-24 ENCOUNTER — Encounter: Payer: Self-pay | Admitting: Family Medicine

## 2011-09-24 VITALS — BP 164/79 | HR 69 | Temp 98.2°F | Ht 62.0 in | Wt 251.0 lb

## 2011-09-24 DIAGNOSIS — Z950 Presence of cardiac pacemaker: Secondary | ICD-10-CM

## 2011-09-24 DIAGNOSIS — I5022 Chronic systolic (congestive) heart failure: Secondary | ICD-10-CM

## 2011-09-24 DIAGNOSIS — G4730 Sleep apnea, unspecified: Secondary | ICD-10-CM

## 2011-09-24 DIAGNOSIS — R569 Unspecified convulsions: Secondary | ICD-10-CM

## 2011-09-24 DIAGNOSIS — Z23 Encounter for immunization: Secondary | ICD-10-CM

## 2011-09-24 MED ORDER — FUROSEMIDE 40 MG PO TABS: ORAL_TABLET | ORAL | Status: DC

## 2011-09-24 NOTE — Assessment & Plan Note (Signed)
No weight loss. 

## 2011-09-24 NOTE — Assessment & Plan Note (Signed)
Wearing CPAP, but needs a new strap

## 2011-09-24 NOTE — Assessment & Plan Note (Signed)
Increased wt and edema. Will increase diuretic

## 2011-09-24 NOTE — Progress Notes (Signed)
  Subjective:    Patient ID: Misty Gray, female    DOB: [birth year], 60 y.o.   MRN: [id number]  HPI she's been stressed recently by the death in [place] of her niece's husband. Her husband's brother recently died. Her husband had umbilical herniorrhaphy which went well.   The rash on her ankles did not require much treatment recently  He does have mild nasal congestion no sore throat for the past couple days  She is occasional muscle cramps.  Hasn't gotten around to the mammogram yet. Would like a flu shot    Review of Systems  Constitutional: Positive for activity change.  HENT: Positive for rhinorrhea and sneezing. Negative for sore throat.   Respiratory: Positive for cough, shortness of breath and wheezing. Negative for stridor.   Cardiovascular: Positive for leg swelling. Negative for chest pain.  Gastrointestinal: Negative for diarrhea.  Genitourinary: Negative for dysuria.  Skin: Positive for rash.  Neurological: Negative for numbness.  Psychiatric/Behavioral: Negative for sleep disturbance.       Objective:   Physical Exam  Constitutional: She is oriented to person, place, and time.       Marked central obesity  HENT:  Head: Normocephalic and atraumatic.  Right Ear: External ear normal.  Left Ear: External ear normal.  Nose: Nose normal.  Mouth/Throat: Oropharynx is clear and moist. No oropharyngeal exudate.       Cerumen nearly occluding both ear canals which was removed using an ear curette. Panic membranes appear normal  Eyes: Conjunctivae are normal. Pupils are equal, round, and reactive to light.  Neck: No thyromegaly present.  Cardiovascular: Normal rate and regular rhythm.        Recently well-healed right pectoral pacemaker scar  Pulmonary/Chest: Effort normal and breath sounds normal.  Musculoskeletal: She exhibits edema.       2+ edema of both ankles  Lymphadenopathy:    She has no cervical adenopathy.  Neurological: She is alert and oriented to  person, place, and time.  Skin:       Erythema of her anterior shins greater on the left with superficial desquamation but no ulceration or papules  Psychiatric: She has a normal mood and affect. Her behavior is normal. Judgment and thought content normal.          Assessment & Plan:

## 2011-09-24 NOTE — Assessment & Plan Note (Signed)
Controlled.  

## 2011-09-24 NOTE — Patient Instructions (Addendum)
Please return to see Dr Sheffield Slider in one month. Call if you lose all of the swelling in your legs.   Avoid salt. Take 3 furosemide tablets daily. Take the third tablet at 3 pm.   We will do lab tests on your next visit

## 2011-09-25 ENCOUNTER — Other Ambulatory Visit: Payer: Self-pay | Admitting: Family Medicine

## 2011-09-25 DIAGNOSIS — Z1231 Encounter for screening mammogram for malignant neoplasm of breast: Secondary | ICD-10-CM

## 2011-10-03 ENCOUNTER — Encounter: Payer: Self-pay | Admitting: Family Medicine

## 2011-10-07 ENCOUNTER — Ambulatory Visit
Admission: RE | Admit: 2011-10-07 | Discharge: 2011-10-07 | Disposition: A | Discharge status: Home / Self Care | Payer: Medicare Other | Source: Ambulatory Visit | Attending: Family Medicine | Admitting: Family Medicine

## 2011-10-07 DIAGNOSIS — Z1231 Encounter for screening mammogram for malignant neoplasm of breast: Secondary | ICD-10-CM

## 2011-11-04 ENCOUNTER — Ambulatory Visit: Payer: Medicare Other | Admitting: Family Medicine

## 2011-11-05 ENCOUNTER — Ambulatory Visit (INDEPENDENT_AMBULATORY_CARE_PROVIDER_SITE_OTHER): Payer: Medicare Other | Admitting: Family Medicine

## 2011-11-05 ENCOUNTER — Encounter: Payer: Self-pay | Admitting: Family Medicine

## 2011-11-05 VITALS — BP 120/75 | HR 71 | Ht 63.0 in | Wt 247.0 lb

## 2011-11-05 DIAGNOSIS — R569 Unspecified convulsions: Secondary | ICD-10-CM

## 2011-11-05 DIAGNOSIS — I428 Other cardiomyopathies: Secondary | ICD-10-CM

## 2011-11-05 DIAGNOSIS — L280 Lichen simplex chronicus: Secondary | ICD-10-CM

## 2011-11-05 DIAGNOSIS — I5022 Chronic systolic (congestive) heart failure: Secondary | ICD-10-CM

## 2011-11-05 LAB — BASIC METABOLIC PANEL
CO2: 28 mEq/L (ref 19–32)
Calcium: 9.3 mg/dL (ref 8.4–10.5)
Creat: 0.73 mg/dL (ref 0.50–1.10)
Glucose, Bld: 86 mg/dL (ref 70–99)

## 2011-11-05 MED ORDER — HALOBETASOL PROPIONATE 0.05 % EX OINT: TOPICAL_OINTMENT | Freq: Two times a day (BID) | CUTANEOUS | Status: DC

## 2011-11-05 NOTE — Progress Notes (Signed)
  Subjective:    Patient ID: Misty Gray, female    DOB: 1951-02-09, 60 y.o.   MRN: [id number]  HPI she denies any recent seizure Her weight gain is still due to going to two birthday parties. Her left lateral hip hurts when she goes to get up. She denies any injury to that area She needs more cream for her left ankle dermatitis  She didn't bring her medicine bottles  Review of Systems     Objective:   Physical Exam she is walking without a limp Chest clear Heart regular rhythm without murmur Right suprapectoral pacemaker is wound is well healed.  Ankles 1+ edema bilaterally Skin 4 x 6 cm area of erythema left anterior upper ankle                   Assessment & Plan:

## 2011-11-05 NOTE — Assessment & Plan Note (Signed)
No significant change.

## 2011-11-05 NOTE — Assessment & Plan Note (Signed)
Well controlled 

## 2011-11-05 NOTE — Assessment & Plan Note (Signed)
Mildly flared again. Responds to potent steroid cream

## 2011-11-05 NOTE — Patient Instructions (Signed)
Please return to see Dr Sheffield Slider in 3 onths.  I'll send you the results of your lab tests.   It was good to see you today. Try to fit your exercise in and cut back on your calories.   Thanks for the poem.

## 2011-11-05 NOTE — Assessment & Plan Note (Signed)
controlled 

## 2011-11-06 ENCOUNTER — Encounter: Payer: Self-pay | Admitting: Family Medicine

## 2011-12-04 ENCOUNTER — Encounter: Payer: Self-pay | Admitting: Family Medicine

## 2012-02-11 ENCOUNTER — Ambulatory Visit: Payer: Medicare Other | Admitting: Family Medicine

## 2012-02-15 ENCOUNTER — Other Ambulatory Visit: Payer: Self-pay | Admitting: Family Medicine

## 2012-02-16 NOTE — Telephone Encounter (Signed)
Refill request

## 2012-02-18 ENCOUNTER — Encounter: Payer: Self-pay | Admitting: Family Medicine

## 2012-02-18 ENCOUNTER — Ambulatory Visit (INDEPENDENT_AMBULATORY_CARE_PROVIDER_SITE_OTHER): Payer: Medicare Other | Admitting: Family Medicine

## 2012-02-18 VITALS — BP 120/78 | HR 70 | Ht 63.0 in | Wt 242.0 lb

## 2012-02-18 DIAGNOSIS — R569 Unspecified convulsions: Secondary | ICD-10-CM

## 2012-03-06 NOTE — Progress Notes (Signed)
  Subjective:    Patient ID: Misty Gray, female    DOB: 1951/01/21, 61 y.o.   MRN: [id number]  HPI    Review of Systems     Objective:   Physical Exam        Assessment & Plan:  She had to leave and I was running behind, so she will reschedule.

## 2012-03-10 ENCOUNTER — Encounter: Payer: Self-pay | Admitting: Family Medicine

## 2012-03-10 ENCOUNTER — Ambulatory Visit (INDEPENDENT_AMBULATORY_CARE_PROVIDER_SITE_OTHER): Payer: Medicare Other | Admitting: Family Medicine

## 2012-03-10 VITALS — BP 124/85 | HR 69 | Ht 63.0 in | Wt 239.0 lb

## 2012-03-10 DIAGNOSIS — I5022 Chronic systolic (congestive) heart failure: Secondary | ICD-10-CM

## 2012-03-10 DIAGNOSIS — L280 Lichen simplex chronicus: Secondary | ICD-10-CM

## 2012-03-10 DIAGNOSIS — B029 Zoster without complications: Secondary | ICD-10-CM

## 2012-03-10 DIAGNOSIS — R569 Unspecified convulsions: Secondary | ICD-10-CM

## 2012-03-10 NOTE — Assessment & Plan Note (Signed)
Hopefully she will have minimal neuralgia

## 2012-03-10 NOTE — Assessment & Plan Note (Signed)
well controlled  

## 2012-03-10 NOTE — Assessment & Plan Note (Signed)
Unchanged

## 2012-03-10 NOTE — Patient Instructions (Signed)
You had shingles on your right low back area. Call for medication if it fails to improve.   Return for lab tests in the morning before taking your seizure medicines in May.  Please return to see Dr Sheffield Slider in 3months.

## 2012-03-10 NOTE — Assessment & Plan Note (Signed)
Stable, she last had anticonvulsant levels since last May.

## 2012-03-10 NOTE — Progress Notes (Addendum)
  Subjective:    Patient ID: Misty Gray, female    DOB: 04-Jun-1951, 61 y.o.   MRN: [id number]  HPI She's had a right low back rash with 5 bumps and a sticking pain beginning about 2 weeks ago. The pain is much improved and the blisters are healing  She has had no recent seizure even though she sometimes forgets to take her anticonvulsant medicine.  No recent chest pain or shortness of breath  Her son lives with her and cooks a more healthy meal than the other son who moved out.  Her ankle swelling has been improved  Two weeks ago she noticed some painful bumps on her left low back without itching. . The Hosman pains continue but are improving. She has been using her potent steroid cream on it.   She still intermittently uses the steroid cream on the her left ankle  She hasn't had any seizures recently even though she sometimes misses doses.    Review of Systems     Objective:   Physical Exam  Constitutional: She is oriented to person, place, and time.  Cardiovascular: Normal rate and regular rhythm.   Pulmonary/Chest: Effort normal and breath sounds normal.  Neurological: She is alert and oriented to person, place, and time.  Skin:       7 cm diameter patch of erythema with hypopigmented macules over right SI area which resembles healing shingles  Patch of erythema left shin mildly reddened as usual  Psychiatric: She has a normal mood and affect.       Jovial as usual          Assessment & Plan:

## 2012-03-11 NOTE — Assessment & Plan Note (Signed)
Controlled with steroid cream

## 2012-04-13 ENCOUNTER — Other Ambulatory Visit: Payer: Self-pay | Admitting: Family Medicine

## 2012-04-13 MED ORDER — PHENOBARBITAL 32.4 MG PO TABS: 97.2000 mg | ORAL_TABLET | Freq: Every day | ORAL | Status: DC

## 2012-04-13 NOTE — Telephone Encounter (Signed)
Written on fax and attached to fax to send to [facility] [phone]

## 2012-06-11 ENCOUNTER — Telehealth: Payer: Self-pay | Admitting: Family Medicine

## 2012-06-11 NOTE — Telephone Encounter (Signed)
Lost dilantin, enalapril, omeprazole, halobetasol - she moved and can't find them anywhere,  Wants to know if these can be called into [facility] [address]

## 2012-06-11 NOTE — Telephone Encounter (Signed)
Called [facility] and patient has refills.  Has not refilled meds since March 2013.  Called patient and informed to pick meds up at pharmacy.  Patient states she is taking all of her meds as prescribed.  Follow-up appt made with Dr. Sheffield Slider for 07/14/12 at 2:30pm.  Gaylene Brooks, RN

## 2012-06-12 ENCOUNTER — Encounter (HOSPITAL_COMMUNITY): Payer: Self-pay | Admitting: Emergency Medicine

## 2012-06-12 ENCOUNTER — Inpatient Hospital Stay (HOSPITAL_COMMUNITY)
Admission: EM | Admit: 2012-06-12 | Discharge: 2012-06-19 | DRG: 292 | Disposition: A | Discharge status: Home / Self Care | Payer: Medicare Other | Attending: Family Medicine | Admitting: Family Medicine

## 2012-06-12 ENCOUNTER — Emergency Department (HOSPITAL_COMMUNITY): Payer: Medicare Other

## 2012-06-12 DIAGNOSIS — I5022 Chronic systolic (congestive) heart failure: Secondary | ICD-10-CM

## 2012-06-12 DIAGNOSIS — Z87891 Personal history of nicotine dependence: Secondary | ICD-10-CM

## 2012-06-12 DIAGNOSIS — I509 Heart failure, unspecified: Secondary | ICD-10-CM | POA: Diagnosis present

## 2012-06-12 DIAGNOSIS — I4891 Unspecified atrial fibrillation: Secondary | ICD-10-CM | POA: Diagnosis present

## 2012-06-12 DIAGNOSIS — G40909 Epilepsy, unspecified, not intractable, without status epilepticus: Secondary | ICD-10-CM | POA: Diagnosis present

## 2012-06-12 DIAGNOSIS — Z9119 Patient's noncompliance with other medical treatment and regimen: Secondary | ICD-10-CM

## 2012-06-12 DIAGNOSIS — Z6841 Body Mass Index (BMI) 40.0 and over, adult: Secondary | ICD-10-CM

## 2012-06-12 DIAGNOSIS — G4733 Obstructive sleep apnea (adult) (pediatric): Secondary | ICD-10-CM | POA: Diagnosis present

## 2012-06-12 DIAGNOSIS — R531 Weakness: Secondary | ICD-10-CM

## 2012-06-12 DIAGNOSIS — Z950 Presence of cardiac pacemaker: Secondary | ICD-10-CM

## 2012-06-12 DIAGNOSIS — I5023 Acute on chronic systolic (congestive) heart failure: Principal | ICD-10-CM | POA: Diagnosis present

## 2012-06-12 DIAGNOSIS — Z91199 Patient's noncompliance with other medical treatment and regimen due to unspecified reason: Secondary | ICD-10-CM

## 2012-06-12 DIAGNOSIS — I428 Other cardiomyopathies: Secondary | ICD-10-CM | POA: Diagnosis present

## 2012-06-12 DIAGNOSIS — K219 Gastro-esophageal reflux disease without esophagitis: Secondary | ICD-10-CM | POA: Diagnosis present

## 2012-06-12 HISTORY — DX: Personal history of other diseases of the digestive system: Z87.19

## 2012-06-12 HISTORY — DX: Cerebral infarction, unspecified: I63.9

## 2012-06-12 HISTORY — DX: Pneumonia, unspecified organism: J18.9

## 2012-06-12 HISTORY — DX: Unspecified convulsions: R56.9

## 2012-06-12 HISTORY — DX: Unspecified atrial fibrillation: I48.91

## 2012-06-12 HISTORY — DX: Presence of cardiac pacemaker: Z95.0

## 2012-06-12 HISTORY — DX: Heart failure, unspecified: I50.9

## 2012-06-12 HISTORY — DX: Cardiac murmur, unspecified: R01.1

## 2012-06-12 HISTORY — DX: Anemia, unspecified: D64.9

## 2012-06-12 HISTORY — DX: Unspecified osteoarthritis, unspecified site: M19.90

## 2012-06-12 LAB — URINE MICROSCOPIC-ADD ON

## 2012-06-12 LAB — POCT I-STAT, CHEM 8
BUN: 19 mg/dL (ref 6–23)
Calcium, Ion: 1.15 mmol/L (ref 1.12–1.32)
Chloride: 108 mEq/L (ref 96–112)
Creatinine, Ser: 0.8 mg/dL (ref 0.50–1.10)
Glucose, Bld: 138 mg/dL — ABNORMAL HIGH (ref 70–99)
HCT: 46 % (ref 36.0–46.0)

## 2012-06-12 LAB — CBC WITH DIFFERENTIAL/PLATELET
Basophils Absolute: 0 10*3/uL (ref 0.0–0.1)
Basophils Relative: 0 % (ref 0–1)
Eosinophils Absolute: 0 10*3/uL (ref 0.0–0.7)
MCHC: 32.6 g/dL (ref 30.0–36.0)
Monocytes Absolute: 0.6 10*3/uL (ref 0.1–1.0)
Neutro Abs: 7 10*3/uL (ref 1.7–7.7)
Neutrophils Relative %: 81 % — ABNORMAL HIGH (ref 43–77)
RDW: 14.7 % (ref 11.5–15.5)

## 2012-06-12 LAB — URINALYSIS, ROUTINE W REFLEX MICROSCOPIC
Hgb urine dipstick: NEGATIVE
Ketones, ur: NEGATIVE mg/dL
Protein, ur: 30 mg/dL — AB
Urobilinogen, UA: 1 mg/dL (ref 0.0–1.0)

## 2012-06-12 LAB — CBC
HCT: 44.2 % (ref 36.0–46.0)
Hemoglobin: 14.5 g/dL (ref 12.0–15.0)
MCH: 30.3 pg (ref 26.0–34.0)
MCHC: 32.8 g/dL (ref 30.0–36.0)
MCV: 92.3 fL (ref 78.0–100.0)
RBC: 4.79 MIL/uL (ref 3.87–5.11)

## 2012-06-12 LAB — CARDIAC PANEL(CRET KIN+CKTOT+MB+TROPI): Troponin I: <0.3 ng/mL (ref <0.30)

## 2012-06-12 LAB — PHENYTOIN LEVEL, TOTAL: Phenytoin Lvl: 8.4 ug/mL — ABNORMAL LOW (ref 10.0–20.0)

## 2012-06-12 MED ORDER — SODIUM CHLORIDE 0.9 % IV SOLN
250.0000 mL | INTRAVENOUS | Status: DC | PRN
  Administered 2012-06-17: 10 mL/h via INTRAVENOUS

## 2012-06-12 MED ORDER — CALCIUM CARBONATE-VITAMIN D 500-200 MG-UNIT PO TABS
1.0000 | ORAL_TABLET | Freq: Two times a day (BID) | ORAL | Status: DC
  Administered 2012-06-12 – 2012-06-19 (×14): 1 via ORAL
  Filled 2012-06-12 (×15): qty 1

## 2012-06-12 MED ORDER — CALCIUM CARBONATE-VITAMIN D 600-400 MG-UNIT PO CHEW
1.0000 | CHEWABLE_TABLET | Freq: Two times a day (BID) | ORAL | Status: DC
Start: 2012-06-12 — End: 2012-06-12

## 2012-06-12 MED ORDER — PHENOBARBITAL 32.4 MG PO TABS
97.2000 mg | ORAL_TABLET | Freq: Every day | ORAL | Status: DC
  Administered 2012-06-12 – 2012-06-18 (×7): 97.2 mg via ORAL
  Filled 2012-06-12 (×7): qty 3

## 2012-06-12 MED ORDER — PANTOPRAZOLE SODIUM 40 MG PO TBEC
40.0000 mg | DELAYED_RELEASE_TABLET | Freq: Every day | ORAL | Status: DC
  Administered 2012-06-13 – 2012-06-18 (×6): 40 mg via ORAL
  Filled 2012-06-12 (×6): qty 1

## 2012-06-12 MED ORDER — FUROSEMIDE 10 MG/ML IJ SOLN
80.0000 mg | Freq: Two times a day (BID) | INTRAMUSCULAR | Status: DC
  Administered 2012-06-12 – 2012-06-15 (×6): 80 mg via INTRAVENOUS
  Filled 2012-06-12 (×8): qty 8

## 2012-06-12 MED ORDER — ONDANSETRON HCL 4 MG/2ML IJ SOLN
4.0000 mg | Freq: Four times a day (QID) | INTRAMUSCULAR | Status: DC | PRN
Start: 2012-06-12 — End: 2012-06-19

## 2012-06-12 MED ORDER — SODIUM CHLORIDE 0.9 % IJ SOLN
3.0000 mL | Freq: Two times a day (BID) | INTRAMUSCULAR | Status: DC
  Administered 2012-06-12 – 2012-06-13 (×2): 3 mL via INTRAVENOUS
  Administered 2012-06-13: 11:00:00 via INTRAVENOUS
  Administered 2012-06-14 – 2012-06-18 (×3): 3 mL via INTRAVENOUS

## 2012-06-12 MED ORDER — PHENYTOIN 50 MG PO CHEW
400.0000 mg | CHEWABLE_TABLET | Freq: Once | ORAL | Status: AC
  Administered 2012-06-12: 400 mg via ORAL
  Filled 2012-06-12: qty 8

## 2012-06-12 MED ORDER — PHENOBARBITAL 97.2 MG PO TABS: 97.2000 mg | ORAL_TABLET | Freq: Every day | ORAL | Status: DC

## 2012-06-12 MED ORDER — FLUTICASONE PROPIONATE 50 MCG/ACT NA SUSP
2.0000 | Freq: Every day | NASAL | Status: DC
  Administered 2012-06-13 – 2012-06-19 (×7): 2 via NASAL
  Filled 2012-06-12 (×2): qty 16

## 2012-06-12 MED ORDER — SODIUM CHLORIDE 0.9 % IJ SOLN: 3.0000 mL | INTRAMUSCULAR | Status: DC | PRN

## 2012-06-12 MED ORDER — PHENOBARBITAL 32.4 MG PO TABS: 97.2000 mg | ORAL_TABLET | Freq: Two times a day (BID) | ORAL | Status: DC

## 2012-06-12 MED ORDER — PHENYTOIN SODIUM EXTENDED 100 MG PO CAPS
400.0000 mg | ORAL_CAPSULE | Freq: Every day | ORAL | Status: DC
  Administered 2012-06-12 – 2012-06-18 (×7): 400 mg via ORAL
  Filled 2012-06-12 (×8): qty 4

## 2012-06-12 MED ORDER — SPIRONOLACTONE 50 MG PO TABS
50.0000 mg | ORAL_TABLET | Freq: Two times a day (BID) | ORAL | Status: DC
  Administered 2012-06-12 – 2012-06-16 (×8): 50 mg via ORAL
  Filled 2012-06-12 (×9): qty 1

## 2012-06-12 MED ORDER — ENOXAPARIN SODIUM 40 MG/0.4ML ~~LOC~~ SOLN
40.0000 mg | SUBCUTANEOUS | Status: DC
  Administered 2012-06-12 – 2012-06-13 (×2): 40 mg via SUBCUTANEOUS
  Filled 2012-06-12 (×3): qty 0.4

## 2012-06-12 MED ORDER — PHENYTOIN SODIUM EXTENDED 100 MG PO CAPS
300.0000 mg | ORAL_CAPSULE | Freq: Every morning | ORAL | Status: DC
  Administered 2012-06-13 – 2012-06-19 (×7): 300 mg via ORAL
  Filled 2012-06-12 (×7): qty 3

## 2012-06-12 MED ORDER — ONDANSETRON HCL 4 MG PO TABS: 4.0000 mg | ORAL_TABLET | Freq: Four times a day (QID) | ORAL | Status: DC | PRN

## 2012-06-12 MED ORDER — SODIUM CHLORIDE 0.9 % IJ SOLN
3.0000 mL | Freq: Two times a day (BID) | INTRAMUSCULAR | Status: DC
  Administered 2012-06-12 – 2012-06-14 (×5): 3 mL via INTRAVENOUS

## 2012-06-12 MED ORDER — ACETAMINOPHEN 325 MG PO TABS: 650.0000 mg | ORAL_TABLET | Freq: Four times a day (QID) | ORAL | Status: DC | PRN

## 2012-06-12 MED ORDER — FUROSEMIDE 10 MG/ML IJ SOLN
60.0000 mg | Freq: Once | INTRAMUSCULAR | Status: AC
  Administered 2012-06-12: 60 mg via INTRAVENOUS
  Filled 2012-06-12: qty 6

## 2012-06-12 MED ORDER — ACETAMINOPHEN 650 MG RE SUPP: 650.0000 mg | Freq: Four times a day (QID) | RECTAL | Status: DC | PRN

## 2012-06-12 NOTE — ED Notes (Signed)
Feel real sob with exertion - getting out of bed. Chest pressure. X 2 days.

## 2012-06-12 NOTE — H&P (Addendum)
Family Medicine Teaching [facility] Admission History and Physical  [name] Medical record number: [id number] Date of birth: Jul 06, 1951 Age: 61 y.o. Gender: female  Primary Care Provider: Tobin Chad, MD  Chief Complaint: shortness of brath History of Present Illness: Misty Gray is a 61 y.o. year old female with systolic CHF and seizure disorder presenting with a 2 day history of shortness of breath and chest pain.  She recently moved and misplaced her medications for the last 2-3 weeks; she just found them and started taking them again 2 days ago.  She was doing well until 2 days ago when she noted chest pressure, shortness of breath, cough and dizziness (primarily with standing).  The chest pressure was not relieved or exacerbated by anything at home.  She did start to use her home oxygen 2 days ago because of the dyspnea.  Reports that the dyspnea worsens with exertion.  She also states that she has not been using her CPAP recently.  The dizziness is described as the room spinning and is associated with nausea (only 1 episode of emesis).  Does also have increased lower extremity edema. Denies fevers, abdominal pain, dysuria, diarrhea, seizure activity.  ED Course: Pro-BNP elevated and CXR with plerual effusions and vascular congestion.  Given IV lasix 60mg  x1 and a dose of her seizure meds.  Patient Active Problem List  Diagnosis  . OBESITY, MORBID  . MIGRAINE, UNSPEC., W/O INTRACTABLE MIGRAINE  . CARDIOMYOPATHY, PRIMARY, DILATED  . CHF - EJECTION FRACTION < 50%  . VENOUS INSUFFICIENCY, CHRONIC  . ALLERGIC RHINITIS  . GASTROESOPHAGEAL REFLUX, NO ESOPHAGITIS  . HERNIA, HIATAL, NONCONGENITAL  . INCONTINENCE, STRESS, FEMALE  . LICHEN SIMPLEX CHRONICUS  . CONVULSIONS, SEIZURES, NOS  . APNEA, SLEEP  . SHORTNESS OF BREATH  . PACEMAKER, PERMANENT  . Sinoatrial node dysfunction  . Shingles  . Acute CHF   Past Medical History: Past Medical History    Diagnosis Date  . Abnormal CT of the head 12/16/1984    R parietal atrophy  . Sleep apnea, obstructive     CPAP started  . Nonischemic cardiomyopathy     EF has normalized  . Sick sinus syndrome     WITH PRIOR DDD PACEMAKER IMPLANTATION  . Hypertension   . GERD (gastroesophageal reflux disease)   . Seizure disorder   . Unspecified venous (peripheral) insufficiency   . Migraine, unspecified, without mention of intractable migraine without mention of status migrainosus   . Diaphragmatic hernia without mention of obstruction or gangrene   . Female stress incontinence   . Allergic rhinitis, cause unspecified   . Morbid obesity   . Lichenification and lichen simplex chronicus     Past Surgical History: Past Surgical History  Procedure Date  . Pacemaker insertion 12/16/1990    DDD EF 30%  . Ventral hernia repair 04/15/1998    Repeat  . Leg skin lesion  biopsy / excision 05/16/2001    Lichen Planus  . Pacemaker insertion 07/25/2003    replaced by BB, leads are both IS1 and from 1992, Most recent Gen Change by [facility] 05/21/11    Social History: History   Social History  . Marital Status: Married    Spouse Name: [name]    Number of Children: 4  . Years of Education: 12   Occupational History  . disabled    Social History Main Topics  . Smoking status: Former Games developer  . Smokeless tobacco: Never Used  . Alcohol Use: No  .  Drug Use: No  . Sexually Active: None   Other Topics Concern  . None   Social History Narrative   [employer] - [employer]Son - [name] lives with themDaughter, [name], and her 3 children live in [place] -  [name]  lives with Thurman Coyer, Clelia Croft lives with themDaughter, [name], in [place]    Family History: Family History  Problem Relation Age of Onset  . Stroke Father     died from CAD?  Marland Kitchen Cancer Mother     melanoma  . Diabetes Son     Type 1  . Sleep apnea Son   . Cancer Sister     cervical  . Hypertension Sister   .  Hypertension Brother   . Rheum arthritis Daughter     Also PGM, PGGM    Allergies: Allergies  Allergen Reactions  . Aspirin     REACTION: urticaria  . Penicillins     REACTION: sweating, told she had a reaction in the hospital    Current Facility-Administered Medications  Medication Dose Route Frequency Provider Last Rate Last Dose  . furosemide (LASIX) injection 60 mg  60 mg Intravenous Once Tatyana A Kirichenko, PA   60 mg at 06/12/12 1642  . phenytoin (DILANTIN) tablet 400 mg  400 mg Oral Once Lottie Mussel, PA       Current Outpatient Prescriptions  Medication Sig Dispense Refill  . Calcium Carbonate-Vitamin D (CALTRATE 600+D) 600-400 MG-UNIT per chew tablet Chew 1 tablet by mouth 2 (two) times daily.       . enalapril (VASOTEC) 20 MG tablet TAKE 1 TABLET EVERY MORNING AND 1/2 TABLET INTHE EVENING  45 tablet  5  . fluticasone (FLONASE) 50 MCG/ACT nasal spray 2 sprays by Nasal route daily.       . furosemide (LASIX) 40 MG tablet Take 2 tabs in the morning and one at 2 PM. Don't take the third pill on days that you are going out.  90 tablet  11  . halobetasol (ULTRAVATE) 0.05 % ointment Apply topically 2 (two) times daily. Apply a small amount to skin twice a day  15 g  11  . metoprolol (LOPRESSOR) 50 MG tablet Take 1 tablet (50 mg total) by mouth 2 (two) times daily.  60 tablet  6  . omeprazole (PRILOSEC) 20 MG capsule TAKE 1 CAPSULE EVERY DAY  30 capsule  4  . PHENobarbital (LUMINAL) 32.4 MG tablet Take 3 tablets (97.2 mg total) by mouth at bedtime.  90 tablet  5  . phenytoin (DILANTIN) 100 MG ER capsule TAKE 3 CAPSULES EVERY MORNING AND 4 CAPSULES EVERY P.M.  210 capsule  5  . spironolactone (ALDACTONE) 50 MG tablet TAKE 1 TABLET BY MOUTH TWICE A DAY  60 tablet  5   Review Of Systems: Per HPI with the following additions: none Otherwise 12 point review of systems was performed and was unremarkable.  Physical Exam: Pulse: 86  Blood Pressure: 104/79 RR: 24   O2: 100 on  2L Temp: 99.1  General: alert, cooperative, appears stated age, no distress and very talkative HEENT: PERRLA, extra ocular movement intact, sclera clear, anicteric, oropharynx clear, no lesions and MMM, lips slightly dry Heart: S1, S2 normal, no murmur, rub or gallop, regular rate and rhythm Lungs: mildly tachypneic, decreased breath sounds in bilateral bases, fine rales present in bases, dullness to percussion in bases Abdomen: abdomen is soft without significant tenderness, masses, organomegaly or guarding Extremities: edema 2+ to knees bilaterally Skin: reticular rash  on arms (pt reports that happens when she gets cold) Neurology: normal without focal findings, mental status, speech normal, alert and oriented x3 and PERLA  Labs and Imaging:  Lab 06/12/12 1434 06/12/12 1402  WBC -- 8.7  HGB 15.6* 14.4  HCT 46.0 44.2  PLT -- 131*    Lab 06/12/12 1434  NA 144  K 4.0  CL 108  CO2 --  BUN 19  CREATININE 0.80  LABGLOM --  GLUCOSE 138*  CALCIUM --   POC trop: 0.03 UA: 30 protein, hyaline casts  CXR:  Cardiomegaly with pulmonary vascular congestion and suspected mild  interstitial edema.  Bilateral lower lobe opacities, right greater than left, likely a  combination of atelectasis and pleural effusions. Underlying right  lower lobe pneumonia is not excluded.  EKG: on my read - 2nd degree AV block with intermittent ventricular pacing  Assessment and Plan: Misty Gray is a 61 y.o. year old female with known CHF (EF 45-50%) and seizure disorder presenting with shortness of breath and cough with labs and imaging consistent with CHF exacerbation.  # CHF exacerbation: Likely secondary to medication non-compliance.  Last echo in 2010 showed EF of 45-50% with inferior hypokinesis. CXR consistent with acute exacerbation with pleural effusions and vascular congestion. - will diurese with home spironolactone and IV lasix 80mg  BID - monitor daily weight and I&Os - will check echo -  holding home enalapril and metoprolol given some low pressures in the ED - will likely restart in the morning  # Chest pain: Likely related to acute CHF exacerbation.  POC troponin and EKG not concerning in ED.  However, was relieved some with SL nitroglycerin. - will cycle cardiac enzymes - repeat EKG in the morning  # Seizure disorder: No reported seizure activity recently.  Dilantin and phenytoin levels are low. - will continue home medications  # s/p pacemaker: EKG with some paced beats. - will monitor on telemetry  # GERD: Stable; continue home medications.  FEN/GI: ADAT; SLIV Prophylaxis: SQ lovenox Disposition: admit to telemetry  BOOTH, ERIN 06/12/2012, 5:29 PM  [facility] Attending Admission Note: Denny Levy MD [phone] pager office [phone] I  have seen and examined this patient at the time of her admission in the emergency department, reviewed their chart. I have discussed this patient with the resident. I agree with the resident's findings, assessment and care plan.

## 2012-06-12 NOTE — ED Notes (Signed)
Pt to XR for CXR.

## 2012-06-12 NOTE — ED Provider Notes (Signed)
History     CSN: [id number]  Arrival date & time 06/12/12  1326   First MD Initiated Contact with Patient 06/12/12 1347      Chief Complaint  Patient presents with  . Shortness of Breath    (Consider location/radiation/quality/duration/timing/severity/associated sxs/prior treatment) Patient is a 61 y.o. female presenting with shortness of breath. The history is provided by the patient.  Shortness of Breath  The current episode started 3 to 5 days ago. The onset was gradual. The problem occurs continuously. The problem has been gradually worsening. The problem is moderate. Associated symptoms include shortness of breath. Pertinent negatives include no chest pain and no fever.  Pt states she has been off of her medications for 2 weeks because misplaced it due to moving. States that restarted again 2 days ago. Ever since has been feeling weak, dizzy, short of breath. Unable to walk even few steps without SOB. No fever, chills, cough. States had 3 seizures at home as well. Denies any chest pain, abdominal pain.   Past Medical History  Diagnosis Date  . Abnormal CT of the head 12/16/1984    R parietal atrophy  . Sleep apnea, obstructive     CPAP started  . Nonischemic cardiomyopathy     EF has normalized  . Sick sinus syndrome     WITH PRIOR DDD PACEMAKER IMPLANTATION  . Hypertension   . GERD (gastroesophageal reflux disease)   . Seizure disorder   . Unspecified venous (peripheral) insufficiency   . Migraine, unspecified, without mention of intractable migraine without mention of status migrainosus   . Diaphragmatic hernia without mention of obstruction or gangrene   . Female stress incontinence   . Allergic rhinitis, cause unspecified   . Morbid obesity   . Lichenification and lichen simplex chronicus     Past Surgical History  Procedure Date  . Pacemaker insertion 12/16/1990    DDD EF 30%  . Ventral hernia repair 04/15/1998    Repeat  . Leg skin lesion  biopsy / excision  05/16/2001    Lichen Planus  . Pacemaker insertion 07/25/2003    replaced by BB, leads are both IS1 and from 1992, Most recent Gen Change by JA 05/21/11    Family History  Problem Relation Age of Onset  . Stroke Father     died from CAD?  Marland Kitchen Cancer Mother     melanoma  . Diabetes Son     Type 1  . Sleep apnea Son   . Cancer Sister     cervical  . Hypertension Sister   . Hypertension Brother   . Rheum arthritis Daughter     Also PGM, PGGM    History  Substance Use Topics  . Smoking status: Former Games developer  . Smokeless tobacco: Never Used  . Alcohol Use: No    OB History    Grav Para Term Preterm Abortions TAB SAB Ect Mult Living                  Review of Systems  Constitutional: Negative for fever and chills.  HENT: Negative.   Respiratory: Positive for shortness of breath. Negative for chest tightness.   Cardiovascular: Positive for leg swelling. Negative for chest pain.  Gastrointestinal: Negative.   Genitourinary: Negative.   Musculoskeletal: Negative.   Neurological: Positive for dizziness and weakness. Negative for light-headedness and numbness.  Psychiatric/Behavioral: Negative.     Allergies  Aspirin and Penicillins  Home Medications   Current Outpatient Rx  Name Route  Sig Dispense Refill  . CALCIUM CARBONATE-VITAMIN D 600-400 MG-UNIT PO CHEW Oral Chew 1 tablet by mouth 2 (two) times daily.     . ENALAPRIL MALEATE 20 MG PO TABS  TAKE 1 TABLET EVERY MORNING AND 1/2 TABLET INTHE EVENING 45 tablet 5  . FLUTICASONE PROPIONATE 50 MCG/ACT NA SUSP Nasal 2 sprays by Nasal route daily.     . FUROSEMIDE 40 MG PO TABS  Take 2 tabs in the morning and one at 2 PM. Don't take the third pill on days that you are going out. 90 tablet 11  . HALOBETASOL PROPIONATE 0.05 % EX OINT Topical Apply topically 2 (two) times daily. Apply a small amount to skin twice a day 15 g 11  . METOPROLOL TARTRATE 50 MG PO TABS Oral Take 1 tablet (50 mg total) by mouth 2 (two) times daily. 60  tablet 6  . OMEPRAZOLE 20 MG PO CPDR  TAKE 1 CAPSULE EVERY DAY 30 capsule 4  . PHENOBARBITAL 32.4 MG PO TABS Oral Take 3 tablets (97.2 mg total) by mouth at bedtime. 90 tablet 5    Sent by fax  . PHENYTOIN SODIUM EXTENDED 100 MG PO CAPS  TAKE 3 CAPSULES EVERY MORNING AND 4 CAPSULES EVERY P.M. 210 capsule 5  . SPIRONOLACTONE 50 MG PO TABS  TAKE 1 TABLET BY MOUTH TWICE A DAY 60 tablet 5    BP 80/52  Pulse 98  Temp 99.1 F (37.3 C) (Oral)  Resp 17  SpO2 96%  Physical Exam  Nursing note and vitals reviewed. Constitutional: She appears well-developed and well-nourished. No distress.  HENT:  Head: Normocephalic.  Eyes: Conjunctivae are normal. Pupils are equal, round, and reactive to light.  Neck: Neck supple.  Cardiovascular: Normal rate.        Irregular rhythm  Pulmonary/Chest: Effort normal. No respiratory distress. She has no wheezes. She has rales.  Abdominal: Soft. Bowel sounds are normal. She exhibits no distension. There is no tenderness. There is no rebound.  Musculoskeletal: She exhibits edema. She exhibits no tenderness.       2+ bilateral pitting lower extremity edema  Skin: Skin is warm and dry.  Psychiatric: She has a normal mood and affect.    ED Course  Procedures (including critical care time)  Labs Reviewed  CBC WITH DIFFERENTIAL - Abnormal; Notable for the following:    Platelets 131 (*)     Neutrophils Relative 81 (*)     All other components within normal limits  PRO B NATRIURETIC PEPTIDE - Abnormal; Notable for the following:    Pro B Natriuretic peptide (BNP) 4247.0 (*)     All other components within normal limits  PHENYTOIN LEVEL, TOTAL - Abnormal; Notable for the following:    Phenytoin Lvl 8.4 (*)     All other components within normal limits  PHENOBARBITAL LEVEL - Abnormal; Notable for the following:    Phenobarbital 5.5 (*)     All other components within normal limits  POCT I-STAT, CHEM 8 - Abnormal; Notable for the following:    Glucose, Bld  138 (*)     Hemoglobin 15.6 (*)     All other components within normal limits  POCT I-STAT TROPONIN I  URINALYSIS, ROUTINE W REFLEX MICROSCOPIC   Dg Chest 2 View  06/12/2012  *RADIOLOGY REPORT*  Clinical Data: Shortness of breath/respiratory distress  CHEST - 2 VIEW  Comparison: 05/21/2011  Findings: Cardiomegaly with pulmonary vascular congestion and suspected mild interstitial edema.  Bilateral lower lobe opacities,  right greater than left, likely a combination of atelectasis and pleural effusions.  Underlying right lower lobe pneumonia is not excluded.  No pneumothorax.  Right subclavian pacemaker.  Degenerative changes of the visualized thoracolumbar spine.  IMPRESSION: Cardiomegaly with pulmonary vascular congestion and suspected mild interstitial edema.  Bilateral lower lobe opacities, right greater than left, likely a combination of atelectasis and pleural effusions.  Underlying right lower lobe pneumonia is not excluded.  Original Report Authenticated By: Charline Bills, M.D.    Date: 06/12/2012  Rate: 92  Rhythm: atrial fibrillation and premature ventricular contractions (PVC)  QRS Axis: normal  Intervals: normal  ST/T Wave abnormalities: nonspecific T wave changes  Conduction Disutrbances:none  Narrative Interpretation:   Old EKG Reviewed: unchanged   4:20 PM Pt with increased swelling, SOB. CXR with pulmonary vascular congestion, mild interstitial edema. Pt appears short of breath laying down. She has cardiac hx, has a pacemaker, BP  Marginally low, will admit for diuresis and monitoring.    Spoke with family medicine will admit.   1. CHF (congestive heart failure)   2. Weakness       MDM         Lottie Mussel, PA 06/12/12 1624

## 2012-06-13 ENCOUNTER — Encounter (HOSPITAL_COMMUNITY): Payer: Self-pay | Admitting: Family Medicine

## 2012-06-13 DIAGNOSIS — G4733 Obstructive sleep apnea (adult) (pediatric): Secondary | ICD-10-CM

## 2012-06-13 DIAGNOSIS — I4891 Unspecified atrial fibrillation: Secondary | ICD-10-CM

## 2012-06-13 DIAGNOSIS — I5043 Acute on chronic combined systolic (congestive) and diastolic (congestive) heart failure: Secondary | ICD-10-CM

## 2012-06-13 DIAGNOSIS — I059 Rheumatic mitral valve disease, unspecified: Secondary | ICD-10-CM

## 2012-06-13 DIAGNOSIS — R1909 Other intra-abdominal and pelvic swelling, mass and lump: Secondary | ICD-10-CM

## 2012-06-13 DIAGNOSIS — I509 Heart failure, unspecified: Secondary | ICD-10-CM

## 2012-06-13 LAB — CBC
HCT: 47.1 % — ABNORMAL HIGH (ref 36.0–46.0)
MCHC: 33.5 g/dL (ref 30.0–36.0)
MCV: 92 fL (ref 78.0–100.0)
Platelets: 141 10*3/uL — ABNORMAL LOW (ref 150–400)
RDW: 14.4 % (ref 11.5–15.5)
WBC: 12.8 10*3/uL — ABNORMAL HIGH (ref 4.0–10.5)

## 2012-06-13 LAB — COMPREHENSIVE METABOLIC PANEL
ALT: 39 U/L — ABNORMAL HIGH (ref 0–35)
Alkaline Phosphatase: 122 U/L — ABNORMAL HIGH (ref 39–117)
BUN: 17 mg/dL (ref 6–23)
Chloride: 99 mEq/L (ref 96–112)
GFR calc Af Amer: 79 mL/min — ABNORMAL LOW (ref >90)
Glucose, Bld: 96 mg/dL (ref 70–99)
Potassium: 4 mEq/L (ref 3.5–5.1)
Sodium: 142 mEq/L (ref 135–145)
Total Bilirubin: 1 mg/dL (ref 0.3–1.2)
Total Protein: 7.4 g/dL (ref 6.0–8.3)

## 2012-06-13 LAB — CARDIAC PANEL(CRET KIN+CKTOT+MB+TROPI)
Relative Index: INVALID (ref 0.0–2.5)
Total CK: 61 U/L (ref 7–177)
Troponin I: <0.3 ng/mL (ref <0.30)

## 2012-06-13 MED ORDER — ENALAPRIL MALEATE 10 MG PO TABS
10.0000 mg | ORAL_TABLET | Freq: Two times a day (BID) | ORAL | Status: DC
  Administered 2012-06-13 – 2012-06-15 (×6): 10 mg via ORAL
  Filled 2012-06-13 (×9): qty 1

## 2012-06-13 MED ORDER — METOPROLOL TARTRATE 25 MG PO TABS
25.0000 mg | ORAL_TABLET | Freq: Two times a day (BID) | ORAL | Status: DC
  Administered 2012-06-13 – 2012-06-14 (×3): 25 mg via ORAL
  Filled 2012-06-13 (×5): qty 1

## 2012-06-13 NOTE — Evaluation (Signed)
Physical Therapy Evaluation Patient Details Name: OTILIA KAREEM MRN: [id number] DOB: [birth year] Today's Date: 06/13/2012 Time: 4098-1191 PT Time Calculation (min): 23 min  PT Assessment / Plan / Recommendation Clinical Impression  Pt adm with acute CHF due to not taking meds x 2 weeks after lost them during her move. Currently at baseline for mobility with no skilled PT needs identified.    PT Assessment  Patent does not need any further PT services    Follow Up Recommendations  No PT follow up    Barriers to Discharge        Equipment Recommendations  None recommended by PT    Recommendations for Other Services     Frequency      Precautions / Restrictions Precautions Precautions: Fall   Pertinent Vitals/Pain SaO2 96% on RA at rest; 99% on RA with ambulation      Mobility  Bed Mobility Bed Mobility: Rolling Left;Left Sidelying to Sit Rolling Left: 6: Modified independent (Device/Increase time);With rail Left Sidelying to Sit: 6: Modified independent (Device/Increase time);With rails Details for Bed Mobility Assistance: incr time and effort;pt reports this is baseline Transfers Transfers: Sit to Stand;Stand to Sit Sit to Stand: 7: Independent;With upper extremity assist;From bed Stand to Sit: With upper extremity assist;6: Modified independent (Device/Increase time);With armrests;To chair/3-in-1 Ambulation/Gait Ambulation/Gait Assistance: 7: Independent Ambulation Distance (Feet): 120 Feet Assistive device: None Ambulation/Gait Assistance Details: Very slow cadence (reports incr dyspnea if she walks any faster); wide base of support; no LOB Gait Pattern: Step-through pattern;Decreased stride length;Wide base of support Gait velocity: significantly decr    Exercises     PT Diagnosis:    PT Problem List:   PT Treatment Interventions:     PT Goals    Visit Information  Last PT Received On: 06/13/12 Assistance Needed: +1    Subjective Data  Subjective: I've  always had bad balance.Marland KitchenMarland KitchenI can trip over my own feet Patient Stated Goal: Remain independent   Prior Functioning  Home Living Lives With: Spouse;Family Available Help at Discharge: Family Type of Home: House Home Access: Stairs to enter Secretary/administrator of Steps: 3 Entrance Stairs-Rails: Right Home Layout: One level Prior Function Level of Independence: Independent Able to Take Stairs?: Yes Driving: No Comments: Reports her balance has been terrible since she was 61 yo.Marland Kitchengets her feet tangled up and falls. Last fall was almost 1 yr ago Communication Communication: No difficulties    Cognition  Overall Cognitive Status: Appears within functional limits for tasks assessed/performed Arousal/Alertness: Awake/alert Orientation Level: Appears intact for tasks assessed Behavior During Session: [facility] for tasks performed    Extremity/Trunk Assessment Right Lower Extremity Assessment RLE ROM/Strength/Tone: [facility] for tasks assessed Left Lower Extremity Assessment LLE ROM/Strength/Tone: [facility] for tasks assessed   Balance    End of Session PT - End of Session Equipment Utilized During Treatment: Gait belt Activity Tolerance: Patient tolerated treatment well Patient left: in chair;with call bell/phone within reach  GP     Mikayah Joy 06/13/2012, 4:23 PM  Pager [phone]

## 2012-06-13 NOTE — Progress Notes (Signed)
PGY-1 Daily Progress Note Family Medicine Teaching Service Misty Gray. Marti Sleigh, MD Service Pager: [phone]   Subjective: Patient states her breathing is slightly better today but she is not at 100% yet. States she has felt drained by this, like she has been "hit by a truck"  Objective:  Temp:  [97.5 F (36.4 C)-99.1 F (37.3 C)] 97.7 F (36.5 C) (06/29 6213) Pulse Rate:  [55-98] 76  (06/29 0637) Resp:  [16-24] 18  (06/29 0637) BP: (80-133)/(52-92) 117/79 mmHg (06/29 0637) SpO2:  [96 %-100 %] 100 % (06/29 0637) Weight:  [231 lb 14.8 oz (105.2 kg)-240 lb 1.3 oz (108.9 kg)] 231 lb 14.8 oz (105.2 kg) (06/29 0865)  Intake/Output Summary (Last 24 hours) at 06/13/12 0818 Last data filed at 06/13/12 0210  Gross per 24 hour  Intake      0 ml  Output   3400 ml  Net  -3400 ml    Gen:  NAD, cooperate, cheerful HEENT: moist mucous membranes, lips slightly dry CV: Regular rate and rhythm, no murmurs rubs or gallops PULM:  Decreased breath sounds bilaterally in both bases with rales noted but distant to mid lung field ABD: soft/nontender/nondistended/normal bowel sounds EXT: 2+ edema Neuro: Alert and oriented x3  Labs and imaging:   CBC  Lab 06/13/12 0309 06/12/12 1952 06/12/12 1434 06/12/12 1402  WBC 12.8* 8.2 -- 8.7  HGB 15.8* 14.5 15.6* --  HCT 47.1* 44.2 46.0 --  PLT 141* 134* -- 131*   BMET/CMET  Lab 06/13/12 0309 06/12/12 1952 06/12/12 1434  NA 142 -- 144  K 4.0 -- 4.0  CL 99 -- 108  CO2 29 -- --  BUN 17 -- 19  CREATININE 0.89 0.86 0.80  CALCIUM 10.0 -- --  PROT 7.4 -- --  BILITOT 1.0 -- --  ALKPHOS 122* -- --  ALT 39* -- --  AST 38* -- --  GLUCOSE 96 -- 138*   PRO B NATRIURETIC PEPTIDE     Status: Abnormal   Collection Time   06/12/12  2:02 PM      Component Value Range   Pro B Natriuretic peptide (BNP) 4247.0 (*) 0 - 125 pg/mL  PHENYTOIN LEVEL, TOTAL     Status: Abnormal   Collection Time   06/12/12  2:23 PM      Component Value Range   Phenytoin Lvl  8.4 (*) 10.0 - 20.0 ug/mL  PHENOBARBITAL LEVEL     Status: Abnormal   Collection Time   06/12/12  2:23 PM      Component Value Range   Phenobarbital 5.5 (*) 15.0 - 40.0 ug/mL   Dg Chest 2 View  06/12/2012  *RADIOLOGY REPORT*  Clinical Data: Shortness of breath/respiratory distress  CHEST - 2 VIEW  Comparison: 05/21/2011  Findings: Cardiomegaly with pulmonary vascular congestion and suspected mild interstitial edema.  Bilateral lower lobe opacities, right greater than left, likely a combination of atelectasis and pleural effusions.  Underlying right lower lobe pneumonia is not excluded.  No pneumothorax.  Right subclavian pacemaker.  Degenerative changes of the visualized thoracolumbar spine.  IMPRESSION: Cardiomegaly with pulmonary vascular congestion and suspected mild interstitial edema.  Bilateral lower lobe opacities, right greater than left, likely a combination of atelectasis and pleural effusions.  Underlying right lower lobe pneumonia is not excluded.  Original Report Authenticated By: Charline Bills, M.D.     Assessment  Misty Gray is a 61 y.o. year old female with known CHF (EF 45-50%) and seizure disorder presenting with shortness  of breath and cough with labs and imaging consistent with CHF exacerbation.   # CHF exacerbation: secondary to medication non-compliance as went almost 2 weeks without medications. Last echo in 2010 showed EF of 45-50% with inferior hypokinesis. CXR consistent with acute exacerbation with pleural effusions and vascular congestion.   - will continue to diurese with home spironolactone and IV lasix 80mg  BID   - monitor daily weight and I&Os   -UOP 3.5 L overnight   - echo ordered  - restart enalapril and metoprolol at 1/2 home dose given low blood pressures in ED now improving.   #New onset A fib-seen on EKG x2. Likely caused by CHF exacerbation. Patient s/p pacemaker but not pacing currently. On telemetery.   -will consult [facility] cardiology for  question of anticoagulation. Also given initial chest discomfort, will inquire about need for stress tes.   # Chest pain: Likely related to acute CHF exacerbation. POC troponin and EKG not concerning in ED. However, was relieved some with SL nitroglycerin. CE negative x2. EKG this morning continues to show a fib.   # Seizure disorder: No reported seizure activity recently. Dilantin and phenytoin levels are low.  - will continue home medications. No seizure activity and patient has not been taking medications.     # GERD: Stable; continue home medications.  FEN/GI: heart healthy diet; SLIV  Prophylaxis: SQ lovenox  Disposition: pending further improvement/diuresis. Likely discharge on Sunday or Monday if continues to improve at current rate.    Tana Conch, MD PGY1, Family Medicine Teaching Service [phone]

## 2012-06-13 NOTE — Progress Notes (Signed)
  Echocardiogram 2D Echocardiogram has been performed.  Misty Gray FRANCES 06/13/2012, 5:33 PM

## 2012-06-13 NOTE — Progress Notes (Signed)
I interviewed and examined this patient and discussed the care plan with Dr. Durene Cal and the [facility] team and agree with assessment and plan as documented in the progress note for today. She continues to breath more easily and currently has no chest or arm discomfort. She has noted intermittent palpitations recently. Although Dr Durene Cal heard a regular rhythm earlier today, it is currently irregularly irregular and the monitor suggests atrial fibrillation, whereas it was a paced rhythm earlier. Cardiology consultation is pending, but if AF is confirmed, she will need long-term anticoagulation. She has generally been a compliant patient.    Dimitri Shakespeare A. Sheffield Slider, MD Family Medicine Teaching Service Attending  06/13/2012 11:20 AM

## 2012-06-13 NOTE — Progress Notes (Signed)
Patient ID: Misty Gray, female   DOB: 01-22-1951, 61 y.o.   MRN: [id number]   CARDIOLOGY CONSULT NOTE  Patient ID: Misty Gray MRN: [id number], DOB/AGE: 25-Nov-1951   Admit date: 06/12/2012 Date of Consult: 06/13/2012   Primary Physician: Tobin Chad, MD Primary Cardiologist: [name] MD  Pt. Profile Misty Gray is a pleasant 61 year old female admitted with acute on chronic combined congestive heart failure. We are asked by Dr. Sheffield Slider to consult on management.  Problem List  Past Medical History  Diagnosis Date  . Abnormal CT of the head 12/16/1984    R parietal atrophy  . Sleep apnea, obstructive     CPAP started  . Nonischemic cardiomyopathy     EF has normalized  . Sick sinus syndrome     WITH PRIOR DDD PACEMAKER IMPLANTATION  . Hypertension   . GERD (gastroesophageal reflux disease)   . Seizure disorder   . Unspecified venous (peripheral) insufficiency   . Migraine, unspecified, without mention of intractable migraine without mention of status migrainosus   . Diaphragmatic hernia without mention of obstruction or gangrene   . Female stress incontinence   . Allergic rhinitis, cause unspecified   . Morbid obesity   . Lichenification and lichen simplex chronicus   . Anginal pain   . Shortness of breath   . Pneumonia   . Pacemaker   . Heart murmur   . Stroke   . CHF (congestive heart failure)   . H/O hiatal hernia     two removed  . Seizures     two this week,   . Arthritis   . Anemia     Past Surgical History  Procedure Date  . Pacemaker insertion 12/16/1990    DDD EF 30%  . Ventral hernia repair 04/15/1998    Repeat  . Leg skin lesion  biopsy / excision 05/16/2001    Lichen Planus  . Pacemaker insertion 07/25/2003    replaced by BB, leads are both IS1 and from 1992, Most recent Gen Change by [facility] 05/21/11  . Insert / replace / remove pacemaker   . Hernia repair      Allergies  Allergies  Allergen Reactions  . Aspirin     REACTION: urticaria  .  Penicillins     REACTION: sweating, told she had a reaction in the hospital    HPI  She moved about 3 weeks ago. She misplaced her medications. Did not take her Lasix or metoprolol during all this time. She admits to not calling the family practice Center and still or trouble.  She presented with progressive shortness of breath, edema, and some chest pressure. Found in congestive heart failure in the emergency room. EKG demonstrated the pacing benign face QRSs there were no acute changes.. She was in sinus rhythm with frequent PACs.  Her cardiac markers have been negative thus far. There is no history of coronary disease.  Pacer is in or history of sick sinus syndrome.   He denies any syncope, palpitations, orthopnea or PND.  Inpatient Medications     . calcium-vitamin D  1 tablet Oral BID  . enalapril  10 mg Oral BID  . enoxaparin  40 mg Subcutaneous Q24H  . fluticasone  2 spray Each Nare Daily  . furosemide  60 mg Intravenous Once  . furosemide  80 mg Intravenous BID  . metoprolol  25 mg Oral BID  . pantoprazole  40 mg Oral Q1200  . PHENobarbital  97.2 mg Oral QHS  .  phenytoin  300 mg Oral q morning - 10a  . phenytoin  400 mg Oral QHS  . phenytoin  400 mg Oral Once  . sodium chloride  3 mL Intravenous Q12H  . sodium chloride  3 mL Intravenous Q12H  . spironolactone  50 mg Oral BID  . DISCONTD: Calcium Carbonate-Vitamin D  1 tablet Oral BID  . DISCONTD: PHENobarbital  97.2 mg Oral QHS  . DISCONTD: PHENobarbital  97.2 mg Oral BID    Family History Family History  Problem Relation Age of Onset  . Stroke Father     died from CAD?  Marland Kitchen Cancer Mother     melanoma  . Diabetes Son     Type 1  . Sleep apnea Son   . Cancer Sister     cervical  . Hypertension Sister   . Hypertension Brother   . Rheum arthritis Daughter     Also PGM, PGGM     Social History History   Social History  . Marital Status: Married    Spouse Name: [name]    Number of Children: 4  .  Years of Education: 12   Occupational History  . disabled    Social History Main Topics  . Smoking status: Former Games developer  . Smokeless tobacco: Never Used  . Alcohol Use: No  . Drug Use: No  . Sexually Active: Not Currently    Birth Control/ Protection: Post-menopausal   Other Topics Concern  . Not on file   Social History Narrative   Lives with husbandSon - [name] lives with them as do his 2 childrenDaughter, [name], and her 3 children live in [place] -  [name] is incarceratedNephew, Clelia Croft lives with themDaughter, [name], in [place] - [place] Rough HolinessMoved June 2013 to a better home on [address]     Review of Systems  General:  No chills, fever, night sweats or weight changes.  Cardiovascular:  No chest pain, dyspnea on exertion, edema, orthopnea, palpitations, paroxysmal nocturnal dyspnea. Dermatological: No rash, lesions/masses Respiratory: No cough, dyspnea Urologic: No hematuria, dysuria Abdominal:   No nausea, vomiting, diarrhea, bright red blood per rectum, melena, or hematemesis Neurologic:  No visual changes, wkns, changes in mental status. All other systems reviewed and are otherwise negative except as noted above.  Physical Exam  Blood pressure 117/79, pulse 76, temperature 97.7 F (36.5 C), temperature source Oral, resp. rate 18, height 5\' 2"  (1.575 m), weight 231 lb 14.8 oz (105.2 kg), SpO2 100.00%.  General: Pleasant, NAD, morbidly obese Psych: Normal affect. Neuro: Alert and oriented X 3. Moves all extremities spontaneously. HEENT: Normal  Neck: Supple without bruits or JVD. Lungs: Basilar crackles. Heart: RRR no s3, s4, or murmurs. Abdomen: Soft, non-tender, non-distended, BS + x 4.  Extremities: No clubbing, cyanosis , 2+ pitting edema bilaterally. DP/PT/Radials 2+ and equal bilaterally.  Labs   [facility] 06/13/12 0309 06/12/12 1951  CKTOTAL 61 75  CKMB 4.1* 4.4*  TROPONINI <0.30 <0.30   Lab Results  Component  Value Date   WBC 12.8* 06/13/2012   HGB 15.8* 06/13/2012   HCT 47.1* 06/13/2012   MCV 92.0 06/13/2012   PLT 141* 06/13/2012    Lab 06/13/12 0309  NA 142  K 4.0  CL 99  CO2 29  BUN 17  CREATININE 0.89  CALCIUM 10.0  PROT 7.4  BILITOT 1.0  ALKPHOS 122*  ALT 39*  AST 38*  GLUCOSE 96   Lab Results  Component Value Date   CHOL 168 10/24/2010  HDL 57 10/24/2010   LDLCALC 66 10/24/2010   TRIG 226* 10/24/2010   No results found for this basename: DDIMER    Radiology/Studies  Dg Chest 2 View  06/12/2012  *RADIOLOGY REPORT*  Clinical Data: Shortness of breath/respiratory distress  CHEST - 2 VIEW  Comparison: 05/21/2011  Findings: Cardiomegaly with pulmonary vascular congestion and suspected mild interstitial edema.  Bilateral lower lobe opacities, right greater than left, likely a combination of atelectasis and pleural effusions.  Underlying right lower lobe pneumonia is not excluded.  No pneumothorax.  Right subclavian pacemaker.  Degenerative changes of the visualized thoracolumbar spine.  IMPRESSION: Cardiomegaly with pulmonary vascular congestion and suspected mild interstitial edema.  Bilateral lower lobe opacities, right greater than left, likely a combination of atelectasis and pleural effusions.  Underlying right lower lobe pneumonia is not excluded.  Original Report Authenticated By: Charline Bills, M.D.    ECG  Normal sinus rhythm with some V. pacing, occasional PACs, no acute ST changes in non paced QRS ASSESSMENT AND PLAN Landsberg as a pleasant lady who is admitted with acute on chronic combined congestive heart failure. Her last ejection fraction was 40-45% by echocardiography in 2010. A repeat echo has been ordered. This is simply a case of noncompliance and failure to take her medicines. I a long time talking to her how to avoid this in the future and how important it is for her to take them on a daily basis.  I suspect her echocardiogram will show little change. I agree with  your medications at this point and management. Close followup after discharge with Dr. Sheffield Slider will be imperative. No further recommendations.   Signed, Valera Castle, MD 06/13/2012, 2:12 PM

## 2012-06-13 NOTE — Progress Notes (Signed)
Pt having episodes during the night where pacemaker was not capturing and no pacer spikes were present. Also pt would alarm VTach, but heart rate would be between 98-105 with an underline rhythm of Afib. Pt asymptomatic at the time. Will continue to monitor.

## 2012-06-14 LAB — BASIC METABOLIC PANEL
BUN: 20 mg/dL (ref 6–23)
CO2: 29 mEq/L (ref 19–32)
Calcium: 9.2 mg/dL (ref 8.4–10.5)
Creatinine, Ser: 1.03 mg/dL (ref 0.50–1.10)
GFR calc Af Amer: 66 mL/min — ABNORMAL LOW (ref >90)

## 2012-06-14 LAB — PROTIME-INR: Prothrombin Time: 16 seconds — ABNORMAL HIGH (ref 11.6–15.2)

## 2012-06-14 MED ORDER — ACETAMINOPHEN 325 MG PO TABS
650.0000 mg | ORAL_TABLET | ORAL | Status: DC | PRN
  Administered 2012-06-17: 650 mg via ORAL
  Filled 2012-06-14: qty 2

## 2012-06-14 MED ORDER — WARFARIN SODIUM 7.5 MG PO TABS
7.5000 mg | ORAL_TABLET | Freq: Once | ORAL | Status: AC
  Administered 2012-06-14: 7.5 mg via ORAL
  Filled 2012-06-14: qty 1

## 2012-06-14 MED ORDER — COUMADIN BOOK
Freq: Once | Status: AC
  Administered 2012-06-14: 14:00:00
  Filled 2012-06-14: qty 1

## 2012-06-14 MED ORDER — HEPARIN (PORCINE) IN NACL 100-0.45 UNIT/ML-% IJ SOLN
1200.0000 [IU]/h | INTRAMUSCULAR | Status: DC
  Administered 2012-06-14: 1100 [IU]/h via INTRAVENOUS
  Administered 2012-06-15 – 2012-06-17 (×3): 1200 [IU]/h via INTRAVENOUS
  Filled 2012-06-14 (×5): qty 250

## 2012-06-14 MED ORDER — WARFARIN - PHARMACIST DOSING INPATIENT
Freq: Every day | Status: DC
  Administered 2012-06-14: 17:00:00

## 2012-06-14 MED ORDER — WARFARIN VIDEO
Freq: Once | Status: AC
  Administered 2012-06-14: 14:00:00

## 2012-06-14 MED ORDER — METOPROLOL TARTRATE 50 MG PO TABS
50.0000 mg | ORAL_TABLET | Freq: Two times a day (BID) | ORAL | Status: DC
  Administered 2012-06-14 – 2012-06-15 (×2): 50 mg via ORAL
  Filled 2012-06-14 (×3): qty 1

## 2012-06-14 MED ORDER — ENALAPRIL MALEATE 20 MG PO TABS: ORAL_TABLET | ORAL | Status: DC

## 2012-06-14 MED ORDER — SPIRONOLACTONE 50 MG PO TABS: 50.0000 mg | ORAL_TABLET | Freq: Two times a day (BID) | ORAL | Status: DC

## 2012-06-14 MED ORDER — HEPARIN BOLUS VIA INFUSION
2000.0000 [IU] | Freq: Once | INTRAVENOUS | Status: AC
  Administered 2012-06-14: 2000 [IU] via INTRAVENOUS
  Filled 2012-06-14: qty 2000

## 2012-06-14 MED ORDER — PHENOBARBITAL 32.4 MG PO TABS: 97.2000 mg | ORAL_TABLET | Freq: Every day | ORAL | Status: DC

## 2012-06-14 MED ORDER — OMEPRAZOLE 20 MG PO CPDR: 20.0000 mg | DELAYED_RELEASE_CAPSULE | Freq: Every day | ORAL | Status: DC

## 2012-06-14 MED ORDER — METOPROLOL TARTRATE 50 MG PO TABS: 50.0000 mg | ORAL_TABLET | Freq: Two times a day (BID) | ORAL | Status: DC

## 2012-06-14 MED ORDER — FUROSEMIDE 40 MG PO TABS: ORAL_TABLET | ORAL | Status: DC

## 2012-06-14 MED ORDER — PHENYTOIN SODIUM EXTENDED 100 MG PO CAPS: ORAL_CAPSULE | ORAL | Status: DC

## 2012-06-14 NOTE — Progress Notes (Signed)
Late entry: I saw this patient in the ED with Dr. Elwyn Reach.  We evaluated and examined the patient together and jointly formulated the plan as laid out in her H+P.  I agree with the history, exam findings, and plan as set forth by her.  Prachi Oftedahl 06/14/2012, 1:40 PM

## 2012-06-14 NOTE — Discharge Summary (Addendum)
Physician Discharge Summary  Patient ID: Misty Gray MRN: [id number] DOB: 15-Aug-1951 Age: 61 y.o.  Admit date: 06/12/2012 Discharge date: 06/15/2012  PCP: Tobin Chad, MD of [facility]  Consultants:[facility]   Discharge Diagnosis: Principal Problem:  *Acute CHF Active Problems:  CARDIOMYOPATHY, PRIMARY, DILATED  CHF - EJECTION FRACTION < 50%  GASTROESOPHAGEAL REFLUX, NO ESOPHAGITIS  CONVULSIONS, SEIZURES, NOS  PACEMAKER, PERMANENT  New onset atrial fibrillation    Hospital Course Misty Gray is a 61 y.o. year old female with known CHF (EF 45-50%) and seizure disorder presenting with shortness of breath and cough with labs and imaging consistent with CHF exacerbation and also found to be in new onset atrial fibrillation.   1. Acute on Chronic CHF exacerbation:  CXR consistent with acute exacerbation with pleural effusions and vascular congestion along with elevated BNP. Symptoms improved throughout stay with aggressive diuresis with IV lasix 80mg  BID and restarting home spironolactone. Restarted home lasix at discharge with 80mg  BID.  Exacerbation secondary to medication non-compliance as went almost 2 weeks without medications. Last echo in 2010 showed EF of 45-50% with inferior hypokinesis. Repeat echocardiogram ordered 06/13/12 and result pending. Patient was continued on home enalapril and metoprolol.   2. New onset A fib-likely secondary to CHF exacerbation and medication noncompliance. Typically rate controlled with metorpolol throughout stay. Cardiology consulted and recommended coumadin for anticoagulation. Patient sent home on 7.5mg  for anticoagulation.  Monitored on telemetry throughout stay.   3.Chest pain: Likely related to acute CHF exacerbation especially given pressure with laying flat. POC troponin and EKG not concerning in ED. However, was relieved some with SL nitroglycerin. CE negative x2. Patient with pacemaker in place and actively  followed by cardiology.   4. Seizure disorder: No reported seizure activity recently. Dilantin and phenytoin levels were low due to noncompliance. Restarted home medications.  5. GERD: Stable; continue home medications.    Discharge Exam: Temp:  [98.1 F (36.7 C)-98.9 F (37.2 C)] 98.1 F (36.7 C) (07/01 1422) Pulse Rate:  [82-111] 82  (07/01 1422) Resp:  [18-20] 19  (07/01 1422) BP: (72-110)/(39-78) 81/57 mmHg (07/01 1427) SpO2:  [95 %-98 %] 96 % (07/01 1422) Weight:  [230 lb 4.8 oz (104.463 kg)] 230 lb 4.8 oz (104.463 kg) (07/01 0634)  Gen: NAD, cooperate, cheerful  HEENT: moist mucous membranes, lips slightly dry  CV: irregularly irregular rhythm and tachy to low 100s. no murmurs rubs or gallops  PULM: Decreased breath sounds bilaterally in both bases with rales noted but distant to mid lung field. Breath sounds slightly more diminished on right than left  ABD: soft/nontender/nondistended/normal bowel sounds  EXT: 2+ edema  Neuro: Alert and oriented x3      Procedures/Imaging:  Dg Chest 2 View  06/12/2012  *RADIOLOGY REPORT*  Clinical Data: Shortness of breath/respiratory distress  CHEST - 2 VIEW  Comparison: 05/21/2011  Findings: Cardiomegaly with pulmonary vascular congestion and suspected mild interstitial edema.  Bilateral lower lobe opacities, right greater than left, likely a combination of atelectasis and pleural effusions.  Underlying right lower lobe pneumonia is not excluded.  No pneumothorax.  Right subclavian pacemaker.  Degenerative changes of the visualized thoracolumbar spine.  IMPRESSION: Cardiomegaly with pulmonary vascular congestion and suspected mild interstitial edema.  Bilateral lower lobe opacities, right greater than left, likely a combination of atelectasis and pleural effusions.  Underlying right lower lobe pneumonia is not excluded.  Original Report Authenticated By: Charline Bills, M.D.   Echo 06/13/2012: Pending Results and will be  available in  [facility]   CBC  Lab 06/15/12 0930 06/13/12 0309 06/12/12 1952  WBC 7.1 12.8* 8.2  HGB 14.1 15.8* 14.5  HCT 42.8 47.1* 44.2  PLT 150 141* 134*   BMET  Lab 06/15/12 0615 06/14/12 0555 06/13/12 0309  NA 135 137 142  K 3.5 3.6 4.0  CL 97 96 99  CO2 29 29 29   BUN 26* 20 17  CREATININE 1.03 1.03 0.89  CALCIUM 8.7 9.2 10.0  PROT -- -- 7.4  BILITOT -- -- 1.0  ALKPHOS -- -- 122*  ALT -- -- 39*  AST -- -- 38*  GLUCOSE 103* 97 96    Lab 06/15/12 0615 06/14/12 1200  APTT -- --  INR 1.34 1.25  PTT -- --    BNP    Component Value Date/Time   PROBNP 4247.0* 06/12/2012 1402          Patient condition at time of discharge/disposition: stable  Disposition-home   Follow up issues: 1. Please follow up symptoms of CHF. Please make sure patient compliant with CHF/CV meds as well as going over her Echo from 06/13/12 2. Please follow up compliance with seizure medications. 3. Please follow up with coumadin dosing and if patient has self-converted from Afib  Discharge follow up:  Follow-up Information    Follow up with Tobin Chad, MD in 11 days.   Contact information:   [address] [place] [address] [phone]       Follow up with [facility] in 2 days.        Discharge Orders    Future Appointments: Provider: Department: Dept Phone: Center:   06/17/2012 11:00 AM [facility] [phone] [facility]   06/25/2012 10:00 AM [facility] [phone] [facility]   07/14/2012 2:30 PM Tobin Chad, MD [facility] [phone] [facility]     Medication List  As of 07/16/2012  8:48 AM   CHANGE how you take these medications         furosemide 80 MG tablet   Commonly known as: LASIX   Take 1 tablet (80 mg total) by mouth 2 (two) times daily.   What changed: - medication strength - dose - route (how to take the med) - how often to take the med - doctor's instructions    Start taking Digoxin 0.125 mg pill  one time per day     STOP taking these medications         enalapril 20 MG tablet      metoprolol 50 MG tablet      omeprazole 20 MG capsule      PHENobarbital 32.4 MG tablet      phenytoin 100 MG ER capsule      spironolactone 50 MG tablet    Lovenox (enoxaparin) 0.7 ml injection (105 mg ) every 12 (twelve) hours        Where to get your medications    These are the prescriptions that you need to pick up. We sent them to a specific pharmacy, so you will need to go there to get them.   [id number] [place], [place] - [address]    [address] [place] [address]    Phone: [phone]        furosemide 80 MG tablet            Gildardo Cranker, D.O.  [facility]  06/15/2012 4:28 PM

## 2012-06-14 NOTE — Progress Notes (Addendum)
SUBJECTIVE:Feeling much better. No chest pain, no dysypnea.  Principal Problem:  *Acute CHF Active Problems:  CARDIOMYOPATHY, PRIMARY, DILATED  CHF - EJECTION FRACTION < 50%  GASTROESOPHAGEAL REFLUX, NO ESOPHAGITIS  CONVULSIONS, SEIZURES, NOS  PACEMAKER, PERMANENT   LABS: Basic Metabolic Panel:  Basename 06/14/12 0555 06/13/12 0309  NA 137 142  K 3.6 4.0  CL 96 99  CO2 29 29  GLUCOSE 97 96  BUN 20 17  CREATININE 1.03 0.89  CALCIUM 9.2 10.0  MG -- --  PHOS -- --   Liver Function Tests:  [facility] 06/13/12 0309  AST 38*  ALT 39*  ALKPHOS 122*  BILITOT 1.0  PROT 7.4  ALBUMIN 3.8   No results found for this basename: LIPASE:2,AMYLASE:2 in the last 72 hours CBC:  Basename 06/13/12 0309 06/12/12 1952 06/12/12 1402  WBC 12.8* 8.2 --  NEUTROABS -- -- 7.0  HGB 15.8* 14.5 --  HCT 47.1* 44.2 --  MCV 92.0 92.3 --  PLT 141* 134* --   Cardiac Enzymes:  Basename 06/13/12 0309 06/12/12 1951  CKTOTAL 61 75  CKMB 4.1* 4.4*  CKMBINDEX -- --  TROPONINI <0.30 <0.30   BNP:4247.0 (Admission)  RADIOLOGY: Dg Chest 2 View  06/12/2012  *RADIOLOGY REPORT*  Clinical Data: Shortness of breath/respiratory distress  CHEST - 2 VIEW  Comparison: 05/21/2011  Findings: Cardiomegaly with pulmonary vascular congestion and suspected mild interstitial edema.  Bilateral lower lobe opacities, right greater than left, likely a combination of atelectasis and pleural effusions.  Underlying right lower lobe pneumonia is not excluded.  No pneumothorax.  Right subclavian pacemaker.  Degenerative changes of the visualized thoracolumbar spine.  IMPRESSION: Cardiomegaly with pulmonary vascular congestion and suspected mild interstitial edema.  Bilateral lower lobe opacities, right greater than left, likely a combination of atelectasis and pleural effusions.  Underlying right lower lobe pneumonia is not excluded.  Original Report Authenticated By: Charline Bills, M.D.     PHYSICAL EXAM BP 106/70   Pulse 94  Temp 97.5 F (36.4 C) (Oral)  Resp 18  Ht 5\' 2"  (1.575 m)  Wt 228 lb 13.4 oz (103.8 kg)  BMI 41.85 kg/m2  SpO2 98%Wt Loss 12 lbs General: Well developed, well nourished, in no acute distress Head: Eyes PERRLA, No xanthomas.   Normal cephalic and atramatic  Lungs: Clear bilaterally to auscultation and percussion. Heart: HRIR S1 S2, No MRG .  Pulses are 2+ & equal.            No carotid bruit. No JVD.  No abdominal bruits. No femoral bruits. Abdomen: Bowel sounds are positive, abdomen soft and non-tender without masses or                  Hernia's noted. Msk:  Back normal, normal gait. Normal strength and tone for age. Extremities: No clubbing, cyanosis or edema.  DP +1 Neuro: Alert and oriented X 3. Psych:  Good affect, responds appropriately  TELEMETRY: Reviewed telemetry pt in Atrial fib with ventricular pacing.  ASSESSMENT AND PLAN:  1. Acute on Chronic CHF: Has diuresed 12 lbs since admission. Feeling and breathing much better. She admits to medical and dietary noncompliance. She was educated by Dr. Daleen Squibb yesterday concerning importance of compliance. She will need close follow-up post discharge and possibly CHF clinic evaluation if this persists for accountability and assessment. She is stable at this time.  2. Atrial fib; Heart rate is currently controlled. Telemetry demonstrates pacing as well. No change in medications. She will need to take everyday. This  was reinforced on rounds.  Misty Gray. Misty Bishop NP [facility] 06/14/2012, 8:20 AM  Patient examined and agree. Telemetry reviewed and she is in AFIB. Needs anticoagulation with elevated CHADS VASC score. Will initiate. Misty Castle, MD 06/14/2012 11:07 AM

## 2012-06-14 NOTE — Progress Notes (Signed)
PGY-1 Daily Progress Note Family Medicine Teaching Service [name]. Marti Sleigh, MD Service Pager: [phone]   Subjective: Patient states breathing continues to improve but she still feels weak and not near 100% yet. No longer like she has been "hit by a truck". She states she cannot sit the bed up at home and would feel more comfortable here as she still gets SOB lying flat. Patient says she feels chest pressure with laying flat.   Patient now able to ambulate to restroom with baseline SOB but when walking down the hall with PT yesterday, became much more SOB  Objective:  Temp:  [97.5 F (36.4 C)-98.8 F (37.1 C)] 97.5 F (36.4 C) (06/30 0658) Pulse Rate:  [88-116] 94  (06/30 0658) Resp:  [18-19] 18  (06/30 0658) BP: (95-106)/(60-83) 106/70 mmHg (06/30 0658) SpO2:  [93 %-98 %] 98 % (06/30 0658) Weight:  [228 lb 13.4 oz (103.8 kg)] 228 lb 13.4 oz (103.8 kg) (06/30 1478)  Intake/Output Summary (Last 24 hours) at 06/14/12 0820 Last data filed at 06/14/12 0740  Gross per 24 hour  Intake    960 ml  Output   5725 ml  Net  -4765 ml    Gen:  NAD, cooperate, cheerful HEENT: moist mucous membranes, lips slightly dry CV: irregularly irregular rhythm and tachy to low 100s. no murmurs rubs or gallops PULM:  Decreased breath sounds bilaterally in both bases with rales noted but distant to mid lung field. Breath sounds slightly more diminished on right than left ABD: soft/nontender/nondistended/normal bowel sounds EXT: 2+ edema Neuro: Alert and oriented x3  Labs and imaging:   CBC  Lab 06/13/12 0309 06/12/12 1952 06/12/12 1434 06/12/12 1402  WBC 12.8* 8.2 -- 8.7  HGB 15.8* 14.5 15.6* --  HCT 47.1* 44.2 46.0 --  PLT 141* 134* -- 131*   BMET/CMET  Lab 06/14/12 0555 06/13/12 0309 06/12/12 1952 06/12/12 1434  NA 137 142 -- 144  K 3.6 4.0 -- 4.0  CL 96 99 -- 108  CO2 29 29 -- --  BUN 20 17 -- 19  CREATININE 1.03 0.89 0.86 --  CALCIUM 9.2 10.0 -- --  PROT -- 7.4 -- --  BILITOT  -- 1.0 -- --  ALKPHOS -- 122* -- --  ALT -- 39* -- --  AST -- 38* -- --  GLUCOSE 97 96 -- 138*     Assessment  Misty Gray is a 61 y.o. year old female with known CHF (EF 45-50%) and seizure disorder presenting with shortness of breath and cough with labs and imaging consistent with CHF exacerbation.   1. CHF exacerbation: continues to improve. secondary to medication non-compliance as went almost 2 weeks without medications. Last echo in 2010 showed EF of 45-50% with inferior hypokinesis. CXR consistent with acute exacerbation with pleural effusions and vascular congestion.   - will continue to diurese with home spironolactone and IV lasix 80mg  BID    -likely restart on lasix 80mg  PO in AM and at 2pm on discharge  - monitor daily weight and I&Os   -UOP 7.5 L since admission. Weight down 3 lbs since yesterday  - echo completed with read pending.   - continue enalapril and metoprolol at approximately 1/2 home dose given low blood pressures in ED which are now improving.    -likely discharge home on home dose  2. New onset A fib-seen on EKG x2. On my read of telemetry-appears patient intermittently in a. Fib as well with HR noted into 120s  at times. Cardiology yesterday did not believe in A fib but will reevaluate telemetry today. If a fib,  Likely caused by CHF exacerbation. Patient s/p pacemaker but not pacing currently.   - [facility] to reassess todayfor question of anticoagulation.   -Also given initial chest discomfort, will ask for input on stress test.   3.Chest pain: Likely related to acute CHF exacerbation especially given pressure with laying flat. POC troponin and EKG not concerning in ED. However, was relieved some with SL nitroglycerin. CE negative x2.    # Seizure disorder: No reported seizure activity recently. Dilantin and phenytoin levels are low. continued home medications. No seizure activity and patient has not been taking medications.  # GERD: Stable;  continue home medications.  FEN/GI: heart healthy diet; SLIV  Prophylaxis: SQ lovenox  Disposition: pending further improvement/diuresis. Likely discharge on  Monday if continues to improve at current rate. Patient will need rx for all home meds at discharge to ensure she is able to locate medicines.    Tana Conch, MD PGY1, Family Medicine Teaching Service [phone]

## 2012-06-14 NOTE — Plan of Care (Signed)
Problem: Phase I Progression Outcomes Goal: EF % per last Echo/documented,Core Reminder form on chart Outcome: Completed/Met Date Met:  06/14/12 08-21-2009 EF 45-50%

## 2012-06-14 NOTE — Progress Notes (Signed)
ANTICOAGULATION CONSULT NOTE - Initial Consult  Pharmacy Consult for Heparin + Warfarin Indication: New-onset Afib  Allergies  Allergen Reactions  . Aspirin     REACTION: urticaria  . Penicillins     REACTION: sweating, told she had a reaction in the hospital    Patient Measurements: Height: 5\' 2"  (157.5 cm) Weight: 228 lb 13.4 oz (103.8 kg) (Scale B.) IBW/kg (Calculated) : 50.1  Heparin Dosing Weight: 75  Vital Signs: Temp: 97.5 F (36.4 C) (06/30 0658) Temp src: Oral (06/30 0658) BP: 106/70 mmHg (06/30 0658) Pulse Rate: 94  (06/30 0658)  Labs:  Misty Gray 06/14/12 0555 06/13/12 0309 06/12/12 1952 06/12/12 1951 06/12/12 1434 06/12/12 1402  HGB -- 15.8* 14.5 -- -- --  HCT -- 47.1* 44.2 -- 46.0 --  PLT -- 141* 134* -- -- 131*  APTT -- -- -- -- -- --  LABPROT -- -- -- -- -- --  INR -- -- -- -- -- --  HEPARINUNFRC -- -- -- -- -- --  CREATININE 1.03 0.89 0.86 -- -- --  CKTOTAL -- 61 -- 75 -- --  CKMB -- 4.1* -- 4.4* -- --  TROPONINI -- <0.30 -- <0.30 -- --    Estimated Creatinine Clearance: 64 ml/min (by C-G formula based on Cr of 1.03).   Medical History: Past Medical History  Diagnosis Date  . Abnormal CT of the head 12/16/1984    R parietal atrophy  . Sleep apnea, obstructive     CPAP started  . Nonischemic cardiomyopathy     EF has normalized  . Sick sinus syndrome     WITH PRIOR DDD PACEMAKER IMPLANTATION  . Hypertension   . GERD (gastroesophageal reflux disease)   . Seizure disorder   . Unspecified venous (peripheral) insufficiency   . Migraine, unspecified, without mention of intractable migraine without mention of status migrainosus   . Diaphragmatic hernia without mention of obstruction or gangrene   . Female stress incontinence   . Allergic rhinitis, cause unspecified   . Morbid obesity   . Lichenification and lichen simplex chronicus   . Anginal pain   . Shortness of breath   . Pneumonia   . Pacemaker   . Heart murmur   . Stroke   . CHF  (congestive heart failure)   . H/O hiatal hernia     two removed  . Seizures     two this week,   . Arthritis   . Anemia     Assessment: 61 y.o. F admitted with SOB and CP and found to have new-onset Afib. Pharmacy was consulted to start heparin bridge to therapeutic INR with warfarin. The patient received a dose of Lovenox 40 mg SQ at 2200 on 6/29 -- so will only give 1/2 bolus of heparin. Baseline INR 1.25, Heparin dosing Wt: 75 kg, SCr: 1.03, CrCl~60-70 ml/min. Hgb 15.8, plts 141.  Goal of Therapy:  INR 2-3 Heparin level 0.3-0.7 units/ml Monitor platelets by anticoagulation protocol: Yes   Plan:  1. D/c lovenox for VTE px 2. Heparin bolus of 2000 units x 1 3. Initiate heparin drip at a rate of 1100 units/hr (11 ml/hr) 4. Warfarin 7.5 mg x 1 dose at 1800 today 5. Warfarin book/video 6. Daily heparin levels, PT/INR, CBC 7. Will continue to monitor for any signs/symptoms of bleeding and will follow up with heparin level in 6 hours and PT/INR in the a.m  Georgina Pillion, PharmD, BCPS Clinical Pharmacist Pager: [phone] 06/14/2012 11:49 AM

## 2012-06-14 NOTE — Progress Notes (Signed)
I interviewed and examined this patient and discussed the care plan with Dr. Durene Cal and the [facility] team and agree with assessment and plan as documented in the progress note for today. CHF is improved, but she is still weak with dyspnea on exertion. Due to AF she is being anticoagulated which can be followed at the [facility].     Jada Fass A. Sheffield Slider, MD Family Medicine Teaching Service Attending  06/14/2012 11:41 AM

## 2012-06-14 NOTE — Progress Notes (Signed)
ANTICOAGULATION CONSULT NOTE - Initial Consult  Pharmacy Consult for Heparin + Warfarin Indication: New-onset Afib  Allergies  Allergen Reactions  . Aspirin     REACTION: urticaria  . Penicillins     REACTION: sweating, told she had a reaction in the hospital    Patient Measurements: Height: 5\' 2"  (157.5 cm) Weight: 228 lb 13.4 oz (103.8 kg) (Scale B.) IBW/kg (Calculated) : 50.1  Heparin Dosing Weight: 75  Vital Signs: Temp: 98.9 F (37.2 C) (06/30 2057) Temp src: Oral (06/30 2057) BP: 91/57 mmHg (06/30 2057) Pulse Rate: 111  (06/30 2057)  Labs:  Basename 06/14/12 2100 06/14/12 1200 06/14/12 0555 06/13/12 0309 06/12/12 1952 06/12/12 1951 06/12/12 1434 06/12/12 1402  HGB -- -- -- 15.8* 14.5 -- -- --  HCT -- -- -- 47.1* 44.2 -- 46.0 --  PLT -- -- -- 141* 134* -- -- 131*  APTT -- -- -- -- -- -- -- --  LABPROT -- 16.0* -- -- -- -- -- --  INR -- 1.25 -- -- -- -- -- --  HEPARINUNFRC 0.27* -- -- -- -- -- -- --  CREATININE -- -- 1.03 0.89 0.86 -- -- --  CKTOTAL -- -- -- 61 -- 75 -- --  CKMB -- -- -- 4.1* -- 4.4* -- --  TROPONINI -- -- -- <0.30 -- <0.30 -- --    Estimated Creatinine Clearance: 64 ml/min (by C-G formula based on Cr of 1.03).   Medical History: Past Medical History  Diagnosis Date  . Abnormal CT of the head 12/16/1984    R parietal atrophy  . Sleep apnea, obstructive     CPAP started  . Nonischemic cardiomyopathy     EF has normalized  . Sick sinus syndrome     WITH PRIOR DDD PACEMAKER IMPLANTATION  . Hypertension   . GERD (gastroesophageal reflux disease)   . Seizure disorder   . Unspecified venous (peripheral) insufficiency   . Migraine, unspecified, without mention of intractable migraine without mention of status migrainosus   . Diaphragmatic hernia without mention of obstruction or gangrene   . Female stress incontinence   . Allergic rhinitis, cause unspecified   . Morbid obesity   . Lichenification and lichen simplex chronicus   . Anginal  pain   . Shortness of breath   . Pneumonia   . Pacemaker   . Heart murmur   . Stroke   . CHF (congestive heart failure)   . H/O hiatal hernia     two removed  . Seizures     two this week,   . Arthritis   . Anemia     Assessment: 61 y.o. F admitted with SOB and CP and found to have new-onset Afib. Pharmacy was consulted to start heparin bridge to therapeutic INR with warfarin. The patient received a dose of Lovenox 40 mg SQ at 2200 on 6/29 -- so will only give 1/2 bolus of heparin. Baseline INR 1.25, Heparin dosing Wt: 75 kg, SCr: 1.03, CrCl~60-70 ml/min. Hgb 15.8, plts 141. Heparin drip started at 1100uts/hr HL 0.27 < goal 0.3-0.7.  No bleeding noted  Goal of Therapy:  INR 2-3 Heparin level 0.3-0.7 units/ml Monitor platelets by anticoagulation protocol: Yes   Plan:  Increase Heparin drip 1200 uts/hr  Daily HL, CBC  Leota Sauers Pharm.D. CPP, BCPS Clinical Pharmacist [phone] 06/14/2012 10:25 PM

## 2012-06-15 DIAGNOSIS — I4891 Unspecified atrial fibrillation: Secondary | ICD-10-CM

## 2012-06-15 LAB — PROTIME-INR: Prothrombin Time: 16.8 seconds — ABNORMAL HIGH (ref 11.6–15.2)

## 2012-06-15 LAB — CBC
MCV: 90.7 fL (ref 78.0–100.0)
Platelets: 150 10*3/uL (ref 150–400)
RBC: 4.72 MIL/uL (ref 3.87–5.11)
WBC: 7.1 10*3/uL (ref 4.0–10.5)

## 2012-06-15 LAB — BASIC METABOLIC PANEL
CO2: 29 mEq/L (ref 19–32)
Calcium: 8.7 mg/dL (ref 8.4–10.5)
Chloride: 97 mEq/L (ref 96–112)
Glucose, Bld: 103 mg/dL — ABNORMAL HIGH (ref 70–99)
Sodium: 135 mEq/L (ref 135–145)

## 2012-06-15 MED ORDER — DIGOXIN 125 MCG PO TABS
0.1250 mg | ORAL_TABLET | Freq: Every day | ORAL | Status: DC
  Administered 2012-06-16 – 2012-06-19 (×4): 0.125 mg via ORAL
  Filled 2012-06-15 (×4): qty 1

## 2012-06-15 MED ORDER — METOPROLOL TARTRATE 25 MG PO TABS
75.0000 mg | ORAL_TABLET | Freq: Two times a day (BID) | ORAL | Status: DC
  Filled 2012-06-15: qty 1

## 2012-06-15 MED ORDER — WARFARIN SODIUM 7.5 MG PO TABS
7.5000 mg | ORAL_TABLET | Freq: Once | ORAL | Status: AC
  Administered 2012-06-15: 7.5 mg via ORAL
  Filled 2012-06-15: qty 1

## 2012-06-15 MED ORDER — WARFARIN SODIUM 7.5 MG PO TABS: ORAL_TABLET | ORAL | Status: DC

## 2012-06-15 MED ORDER — METOPROLOL TARTRATE 50 MG PO TABS
50.0000 mg | ORAL_TABLET | Freq: Two times a day (BID) | ORAL | Status: DC
  Administered 2012-06-15 – 2012-06-16 (×2): 50 mg via ORAL
  Filled 2012-06-15 (×3): qty 1

## 2012-06-15 MED ORDER — FUROSEMIDE 80 MG PO TABS: 80.0000 mg | ORAL_TABLET | Freq: Two times a day (BID) | ORAL | Status: DC

## 2012-06-15 MED ORDER — METOPROLOL TARTRATE 50 MG PO TABS: 75.0000 mg | ORAL_TABLET | Freq: Two times a day (BID) | ORAL | Status: DC

## 2012-06-15 MED ORDER — FUROSEMIDE 80 MG PO TABS
80.0000 mg | ORAL_TABLET | Freq: Two times a day (BID) | ORAL | Status: DC
  Administered 2012-06-15 – 2012-06-16 (×2): 80 mg via ORAL
  Filled 2012-06-15 (×4): qty 1

## 2012-06-15 MED ORDER — GI COCKTAIL ~~LOC~~
30.0000 mL | Freq: Once | ORAL | Status: AC
  Administered 2012-06-15: 30 mL via ORAL
  Filled 2012-06-15: qty 30

## 2012-06-15 MED ORDER — ENOXAPARIN SODIUM 150 MG/ML ~~LOC~~ SOLN: 1.0000 mg/kg | Freq: Two times a day (BID) | SUBCUTANEOUS | Status: DC

## 2012-06-15 MED ORDER — DIGOXIN 250 MCG PO TABS
0.2500 mg | ORAL_TABLET | Freq: Four times a day (QID) | ORAL | Status: AC
  Administered 2012-06-15 – 2012-06-16 (×3): 0.25 mg via ORAL
  Filled 2012-06-15 (×3): qty 1

## 2012-06-15 NOTE — Discharge Summary (Signed)
FMTS Attending  Patient seen and examined by me; see separate entry for attending note.  I agree with plan for discharge and close outpatient follow up as documented by resident note.  Paula Compton, MD

## 2012-06-15 NOTE — Progress Notes (Signed)
FMTS Attending Note  Patient seen and examined by me, discussed with resident team.  I agree with Dr. Paulina Fusi' assessment and plan as documented in this note, with following additions:  Pt with acute-on-chronic systolic heart failure.  She has diuresed substantially, as evidenced by her daily body weights (decreased by 12 lbs in this admission thus far).  She is not requiring supplemental oxygen at rest, but does use 2L/min nasal canula oxygen at bedtime at baseline. She had ECHO completed on 6/28, would follow up with results as we prepare for discharge, likely later today.  Close follow up in [facility] is crucial to prevent readmission.  Paula Compton, MD

## 2012-06-15 NOTE — Progress Notes (Signed)
PGY-1 Daily Progress Note Family Medicine Teaching Service Misty Gray. Misty Sleigh, MD Service Pager: [phone]   Subjective: Patient states breathing continues to improve and closer to her baseline.  She states she cannot sit the bed up at home and would feel more comfortable here as she still gets SOB lying flat. Patient says she feels chest pressure with laying flat.   Patient now able to ambulate to restroom with baseline SOB.  Objective:  Temp:  [97 F (36.1 C)-98.9 F (37.2 C)] 98.2 F (36.8 C) (07/01 0634) Pulse Rate:  [90-111] 90  (07/01 0634) Resp:  [18-20] 18  (07/01 0634) BP: (88-109)/(39-78) 109/39 mmHg (07/01 0634) SpO2:  [95 %-99 %] 96 % (07/01 0634) Weight:  [230 lb 4.8 oz (104.463 kg)] 230 lb 4.8 oz (104.463 kg) (07/01 1478)  Intake/Output Summary (Last 24 hours) at 06/15/12 0917 Last data filed at 06/15/12 0909  Gross per 24 hour  Intake   1200 ml  Output   1700 ml  Net   -500 ml    Gen:  NAD, cooperate, cheerful HEENT: moist mucous membranes, lips slightly dry CV: irregularly irregular rhythm  no murmurs rubs or gallops PULM:  Decreased breath sounds bilaterally in both  Breath sounds slightly more diminished on right than left ABD: soft/nontender/nondistended/normal bowel sounds EXT: 2+ edema Neuro: Alert and oriented x3  Labs and imaging:   CBC  Lab 06/15/12 0930 06/13/12 0309 06/12/12 1952  WBC 7.1 12.8* 8.2  HGB 14.1 15.8* 14.5  HCT 42.8 47.1* 44.2  PLT 150 141* 134*   BMET/CMET  Lab 06/15/12 0615 06/14/12 0555 06/13/12 0309  NA 135 137 142  K 3.5 3.6 4.0  CL 97 96 99  CO2 29 29 29   BUN 26* 20 17  CREATININE 1.03 1.03 0.89  CALCIUM 8.7 9.2 10.0  PROT -- -- 7.4  BILITOT -- -- 1.0  ALKPHOS -- -- 122*  ALT -- -- 39*  AST -- -- 38*  GLUCOSE 103* 97 96   BNP    Component Value Date/Time   PROBNP 4247.0* 06/12/2012 1402    EKG PVC along with AFib  MEDS Scheduled Meds:   . calcium-vitamin D  1 tablet Oral BID  . coumadin book    Does not apply Once  . enalapril  10 mg Oral BID  . fluticasone  2 spray Each Nare Daily  . furosemide  80 mg Intravenous BID  . gi cocktail  30 mL Oral Once  . heparin  2,000 Units Intravenous Once  . metoprolol  50 mg Oral BID  . pantoprazole  40 mg Oral Q1200  . PHENobarbital  97.2 mg Oral QHS  . phenytoin  300 mg Oral q morning - 10a  . phenytoin  400 mg Oral QHS  . sodium chloride  3 mL Intravenous Q12H  . sodium chloride  3 mL Intravenous Q12H  . spironolactone  50 mg Oral BID  . warfarin  7.5 mg Oral ONCE-1800  . warfarin  7.5 mg Oral ONCE-1800  . warfarin   Does not apply Once  . Warfarin - Pharmacist Dosing Inpatient   Does not apply q1800  . DISCONTD: enoxaparin  40 mg Subcutaneous Q24H  . DISCONTD: metoprolol  25 mg Oral BID   Continuous Infusions:   . heparin 1,200 Units/hr (06/14/12 2226)   PRN Meds:.sodium chloride, acetaminophen, ondansetron (ZOFRAN) IV, ondansetron, sodium chloride, DISCONTD: acetaminophen, DISCONTD: acetaminophen   Assessment  Misty Gray is a 61 y.o. year old female  with known CHF (EF 45-50%) and seizure disorder presenting with shortness of breath and cough with labs and imaging consistent with CHF exacerbation.   1. CHF exacerbation: continues to improve. secondary to medication non-compliance as went almost 2 weeks without medications. Last echo in 2010 showed EF of 45-50% with inferior hypokinesis. Pending Echo done on 06/12/12.  CXR consistent with acute exacerbation with pleural effusions and vascular congestion.   - will continue to diurese with home spironolactone    - restart on lasix 80mg  PO in AM and at 4pm on                      discharge   - continue enalapril and metoprolol at approximately 1/2 home dose given low blood pressures in ED which are now improving.    -likely discharge home on home dose  2. New onset A fib-seen on EKG x2. On my read of telemetry-appears patient intermittently in a. Fib as well with HR noted into 120s  at times. Cardiology yesterday did not believe in A fib but will reevaluate telemetry today. If a fib,  Likely caused by CHF exacerbation. Patient s/p pacemaker but not pacing currently.   - Started on coumadin per dosing by pharmacy   3.Chest pain: Likely related to acute CHF exacerbation especially given pressure with laying flat. POC troponin and EKG not concerning in ED. However, was relieved some with SL nitroglycerin. CE negative x2.    # Seizure disorder: No reported seizure activity recently. Dilantin and phenytoin levels are low. continued home medications. No seizure activity and patient has not been taking medications.  # GERD: Stable; continue home medications.  FEN/GI: heart healthy diet; SLIV  Prophylaxis: SQ lovenox  Disposition: pending further improvement/diuresis. Likely discharge today.  Cardiology recommends d/c on coumadin per pharmacy.  Patient will need rx for all home meds at discharge to ensure she is able to locate medicines.    Gildardo Cranker 06/15/12 9:23 AM

## 2012-06-15 NOTE — Progress Notes (Signed)
[name]      SUBJECTIVE: says sob is not better than what it has been  Past Medical History  Diagnosis Date  . Abnormal CT of the head 12/16/1984    R parietal atrophy  . Sleep apnea, obstructive     CPAP started  . Nonischemic cardiomyopathy     EF has normalized  . Sick sinus syndrome     WITH PRIOR DDD PACEMAKER IMPLANTATION  . Hypertension   . GERD (gastroesophageal reflux disease)   . Seizure disorder   . Unspecified venous (peripheral) insufficiency   . Migraine, unspecified, without mention of intractable migraine without mention of status migrainosus   . Diaphragmatic hernia without mention of obstruction or gangrene   . Female stress incontinence   . Allergic rhinitis, cause unspecified   . Morbid obesity   . Lichenification and lichen simplex chronicus   . Anginal pain   . Shortness of breath   . Pneumonia   . Pacemaker   . Heart murmur   . Stroke   . CHF (congestive heart failure)   . H/O hiatal hernia     two removed  . Seizures     two this week,   . Arthritis   . Anemia     PHYSICAL EXAM Filed Vitals:   06/15/12 0945 06/15/12 1100 06/15/12 1422 06/15/12 1427  BP:  110/42 72/48 81/57   Pulse: 85 88 82   Temp:   98.1 F (36.7 C)   TempSrc:   Oral   Resp:   19   Height:      Weight:      SpO2: 98%  96%   .Well developed and nourished in no acute distress obese HENT normal Neck supple Carotids brisk and full without bruits Clear Irregularly irregular rate and rhythm with /rapid ventricular response, no murmurs or gallops Abd-soft with active BS without hepatomegaly No Clubbing cyanosis 1-2+ EDE,A Skin-warm and dry A & Oriented  Grossly normal sensory and motor function TELEMETRY: Reviewed telemetry pt in  afib with intermittnet pacing:    Intake/Output Summary (Last 24 hours) at 06/15/12 1548 Last data filed at 06/15/12 1312  Gross per 24 hour  Intake   1320 ml  Output   1350 ml  Net    -30 ml    LABS: Basic  Metabolic Panel:  Lab 06/15/12 9604 06/14/12 0555 06/13/12 0309 06/12/12 1952 06/12/12 1434  NA 135 137 142 -- 144  K 3.5 3.6 4.0 -- 4.0  CL 97 96 99 -- 108  CO2 29 29 29  -- --  GLUCOSE 103* 97 96 -- 138*  BUN 26* 20 17 -- 19  CREATININE 1.03 1.03 0.89 0.86 0.80  CALCIUM 8.7 9.2 -- -- --  MG -- -- -- -- --  PHOS -- -- -- -- --   Cardiac Enzymes:  Basename 06/13/12 0309 06/12/12 1951  CKTOTAL 61 75  CKMB 4.1* 4.4*  CKMBINDEX -- --  TROPONINI <0.30 <0.30   CBC:  Lab 06/15/12 0930 06/13/12 0309 06/12/12 1952 06/12/12 1434 06/12/12 1402  WBC 7.1 12.8* 8.2 -- 8.7  NEUTROABS -- -- -- -- 7.0  HGB 14.1 15.8* 14.5 15.6* 14.4  HCT 42.8 47.1* 44.2 46.0 44.2  MCV 90.7 92.0 92.3 -- 92.3  PLT 150 141* 134* -- 131*   PROTIME:  Basename 06/15/12 0615 06/14/12 1200  LABPROT 16.8* 16.0*  INR 1.34 1.25   Liver Function Tests:  Basename 06/13/12 0309  AST 38*  ALT  39*  ALKPHOS 122*  BILITOT 1.0  PROT 7.4  ALBUMIN 3.8     Basename 06/14/12 1200  TSH 0.777  T4TOTAL --  T3FREE --  THYROIDAB --   Anemia Panel: No results found for this basename: VITAMINB12,FOLATE,FERRITIN,TIBC,IRON,RETICCTPCT in the last 72 hours  wILL INTEROGATE PACEMAKER    ASSESSMENT AND PLAN:  Patient Active Hospital Problem List:  CHF   CARDIOMYOPATHY, PRIMARY, DILATED (08/10/2009)     PACEMAKER, PERMANENT (08/10/2009)   New onset atrial fibrillation (06/14/2012)   Needs augmented rate control, will increase beta blocker Agree with need for anticoagulation Would anticipate  outpt cardioversion OR  With her SOB persisting it may make sense to TEE DCCV while in house  Her volume overload is noteworthy esp giving rising BunCR, i cant see neck veins. An echo was ordered but not done, and with history of NICM, may be worth doing to exclude the possiblitiy of tachy induced cardiomyopathy  WHY IS SHE ON SO MUCH ALDACTONE Signed, Sherryl Manges MD  06/15/2012

## 2012-06-15 NOTE — Progress Notes (Signed)
Dr Graciela Husbands here Pt had Pacemaker interrogated - BP now 105/68 orders received  T [facility] RN

## 2012-06-15 NOTE — Progress Notes (Signed)
ANTICOAGULATION CONSULT NOTE - Initial Consult  Pharmacy Consult for Heparin + Warfarin Indication: New-onset Afib  Allergies  Allergen Reactions  . Aspirin     REACTION: urticaria  . Penicillins     REACTION: sweating, told she had a reaction in the hospital    Patient Measurements: Height: 5\' 2"  (157.5 cm) Weight: 230 lb 4.8 oz (104.463 kg) (scale B) IBW/kg (Calculated) : 50.1  Heparin Dosing Weight: 75  Vital Signs: Temp: 98.2 F (36.8 C) (07/01 0634) Temp src: Oral (07/01 0634) BP: 109/39 mmHg (07/01 0634) Pulse Rate: 90  (07/01 0634)  Labs:  Alvira Philips 06/15/12 0615 06/14/12 2100 06/14/12 1200 06/14/12 0555 06/13/12 0309 06/12/12 1952 06/12/12 1951 06/12/12 1434 06/12/12 1402  HGB -- -- -- -- 15.8* 14.5 -- -- --  HCT -- -- -- -- 47.1* 44.2 -- 46.0 --  PLT -- -- -- -- 141* 134* -- -- 131*  APTT -- -- -- -- -- -- -- -- --  LABPROT 16.8* -- 16.0* -- -- -- -- -- --  INR 1.34 -- 1.25 -- -- -- -- -- --  HEPARINUNFRC 0.65 0.27* -- -- -- -- -- -- --  CREATININE 1.03 -- -- 1.03 0.89 -- -- -- --  CKTOTAL -- -- -- -- 61 -- 75 -- --  CKMB -- -- -- -- 4.1* -- 4.4* -- --  TROPONINI -- -- -- -- <0.30 -- <0.30 -- --    Estimated Creatinine Clearance: 64.3 ml/min (by C-G formula based on Cr of 1.03).   Medical History: Past Medical History  Diagnosis Date  . Abnormal CT of the head 12/16/1984    R parietal atrophy  . Sleep apnea, obstructive     CPAP started  . Nonischemic cardiomyopathy     EF has normalized  . Sick sinus syndrome     WITH PRIOR DDD PACEMAKER IMPLANTATION  . Hypertension   . GERD (gastroesophageal reflux disease)   . Seizure disorder   . Unspecified venous (peripheral) insufficiency   . Migraine, unspecified, without mention of intractable migraine without mention of status migrainosus   . Diaphragmatic hernia without mention of obstruction or gangrene   . Female stress incontinence   . Allergic rhinitis, cause unspecified   . Morbid obesity   .  Lichenification and lichen simplex chronicus   . Anginal pain   . Shortness of breath   . Pneumonia   . Pacemaker   . Heart murmur   . Stroke   . CHF (congestive heart failure)   . H/O hiatal hernia     two removed  . Seizures     two this week,   . Arthritis   . Anemia     Assessment: 61 y.o. F admitted with SOB and CP and found to have new-onset Afib. Pharmacy was consulted to start heparin bridge to therapeutic INR with warfarin. Heparin level therapeutic this AM. INR 1.34 after 1 dose of coumadin. No bleeding.  Goal of Therapy:  INR 2-3 Heparin level 0.3-0.7 units/ml Monitor platelets by anticoagulation protocol: Yes   Plan:  Heparin drip 1200 uts/hr  Daily HL, CBC Coumadin 7.5mg  PO x1

## 2012-06-15 NOTE — Progress Notes (Signed)
UR Completed. Rayvn Rickerson, RN, Nurse Case Manager [phone]     

## 2012-06-15 NOTE — ED Provider Notes (Signed)
Medical screening examination/treatment/procedure(s) were performed by non-physician practitioner and as supervising physician I was immediately available for consultation/collaboration.  Juliet Rude. Rubin Payor, MD 06/15/12 1453

## 2012-06-15 NOTE — Progress Notes (Signed)
pts bp is low 88>>>105  This may preclude adequate HR control for her afib, and make diuresis hard.  I will add digoxin and we may need to think about TEE DCCV prior to discharge

## 2012-06-15 NOTE — Progress Notes (Signed)
Notified Dr Ethelene Hal that pt's BP was 81/57 no orders received T Shyloh Derosa RN

## 2012-06-15 NOTE — Evaluation (Signed)
Occupational Therapy Evaluation Patient Details Name: Misty Gray MRN: [id number] DOB: 06-18-1951 Today's Date: 06/15/2012 Time: 4098-1191 OT Time Calculation (min): 31 min  OT Assessment / Plan / Recommendation Clinical Impression  Pleasant 61 yr old female admitted with CHF, now presents with slightly decreased endurance and overall independence with basic selfcare tasks compared to PLOF.  Feel she will benefit from acute OT needs to help increase independence back to a modified independent level and to learn AE/DME available to help with this.  Do not anticipate any post acute OT needs.    OT Assessment  Patient needs continued OT Services    Follow Up Recommendations  No OT follow up       Equipment Recommendations  3 in 1 bedside comode       Frequency  Min 2X/week    Precautions / Restrictions Precautions Precautions: Fall Restrictions Weight Bearing Restrictions: No   Pertinent Vitals/Pain HR 85, O2 98% on room air    ADL  Eating/Feeding: Simulated;Independent Where Assessed - Eating/Feeding: Chair Grooming: Performed;Supervision/safety Where Assessed - Grooming: Unsupported standing Upper Body Bathing: Simulated;Set up Where Assessed - Upper Body Bathing: Unsupported sitting Lower Body Bathing: Simulated;Set up Where Assessed - Lower Body Bathing: Unsupported sit to stand Upper Body Dressing: Simulated;Set up Where Assessed - Upper Body Dressing: Unsupported sitting Lower Body Dressing: Performed;Supervision/safety Where Assessed - Lower Body Dressing: Unsupported sit to stand Toilet Transfer: Performed;Supervision/safety Toilet Transfer Method: Stand pivot Acupuncturist: Comfort height toilet Toileting - Architect and Hygiene: Simulated;Supervision/safety Where Assessed - Engineer, mining and Hygiene: Sit to stand from 3-in-1 or toilet Tub/Shower Transfer Method: Not assessed Transfers/Ambulation Related to ADLs: Pt  overall supervision for mobility without use of assistive device.  Moves slowly with wide base of support. ADL Comments: Pt with decreased ability to reach the floor for retrieving items.  Also with slight difficulty donning socks at EOC.  Reports having a shower bench but wasn't sure how it was supposed to fit in the tub.  Feel she will need a 3:1 to help with toileting tasks and for greater independence with sit to stand since her toilet is low at home.  Issued energy conservation handout as well.  Reports being fatigued but O2 sats 98% and dyspnea only 1/4 with activity.    OT Diagnosis: Generalized weakness  OT Problem List: Decreased strength;Decreased activity tolerance;Decreased knowledge of use of DME or AE;Cardiopulmonary status limiting activity OT Treatment Interventions: Self-care/ADL training;Therapeutic activities;Energy conservation;DME and/or AE instruction;Patient/family education;Balance training   OT Goals Acute Rehab OT Goals OT Goal Formulation: With patient Time For Goal Achievement: 06/29/12 Potential to Achieve Goals: Good ADL Goals Pt Will Perform Grooming: with modified independence;Sitting at sink ADL Goal: Grooming - Progress: Goal set today Pt Will Perform Lower Body Dressing: with adaptive equipment;Sit to stand from chair;Sit to stand from bed;with modified independence ADL Goal: Lower Body Dressing - Progress: Goal set today Pt Will Transfer to Toilet: with modified independence;3-in-1 ADL Goal: Toilet Transfer - Progress: Goal set today Pt Will Perform Tub/Shower Transfer: Tub transfer;with DME;Transfer tub bench ADL Goal: Tub/Shower Transfer - Progress: Goal set today Miscellaneous OT Goals Miscellaneous OT Goal #1: Pt will independently verbalize at least 3 energy conservation strategies for selfcare tasks. OT Goal: Miscellaneous Goal #1 - Progress: Goal set today  Visit Information  Last OT Received On: 06/15/12 Assistance Needed: +1    Subjective  Data  Subjective: "I was doing everything for myself, even the laundry." Patient Stated Goal: Get  back to the way she was.   Prior Functioning  Home Living Lives With: Spouse;Family Available Help at Discharge: Family Type of Home: House Home Access: Stairs to enter Secretary/administrator of Steps: 3 Entrance Stairs-Rails: Right Home Layout: One level Bathroom Shower/Tub: Engineer, manufacturing systems: Standard Bathroom Accessibility: Yes Home Adaptive Equipment: Reacher Prior Function Level of Independence: Independent Able to Take Stairs?: Yes Driving: No Communication Communication: No difficulties Dominant Hand: Right    Cognition  Overall Cognitive Status: Appears within functional limits for tasks assessed/performed Arousal/Alertness: Awake/alert Orientation Level: Appears intact for tasks assessed Behavior During Session: [facility] for tasks performed    Extremity/Trunk Assessment Right Upper Extremity Assessment RUE ROM/Strength/Tone: Within functional levels Left Upper Extremity Assessment LUE ROM/Strength/Tone: Within functional levels Trunk Assessment Trunk Assessment: Kyphotic (Pt maintains cervical flexion in standing.)   Mobility Transfers Transfers: Sit to Stand Sit to Stand: 5: Supervision;With upper extremity assist Stand to Sit: 5: Supervision;Without upper extremity assist      Balance Balance Balance Assessed: Yes Static Standing Balance Static Standing - Balance Support: No upper extremity supported Static Standing - Level of Assistance: 5: Stand by assistance  End of Session OT - End of Session Activity Tolerance: Patient limited by fatigue Patient left: in chair;with call bell/phone within reach     [facility] OTR/L 06/15/2012, 12:13 PM Pager number [phone]

## 2012-06-16 DIAGNOSIS — I509 Heart failure, unspecified: Secondary | ICD-10-CM

## 2012-06-16 LAB — BASIC METABOLIC PANEL
BUN: 24 mg/dL — ABNORMAL HIGH (ref 6–23)
Chloride: 96 mEq/L (ref 96–112)
GFR calc Af Amer: 86 mL/min — ABNORMAL LOW (ref >90)
GFR calc non Af Amer: 74 mL/min — ABNORMAL LOW (ref >90)
Potassium: 3.7 mEq/L (ref 3.5–5.1)
Sodium: 133 mEq/L — ABNORMAL LOW (ref 135–145)

## 2012-06-16 LAB — CBC
HCT: 39.4 % (ref 36.0–46.0)
MCHC: 33 g/dL (ref 30.0–36.0)
Platelets: 129 10*3/uL — ABNORMAL LOW (ref 150–400)
RDW: 14.2 % (ref 11.5–15.5)
WBC: 6.9 10*3/uL (ref 4.0–10.5)

## 2012-06-16 LAB — PROTIME-INR
INR: 1.43 (ref 0.00–1.49)
Prothrombin Time: 17.7 seconds — ABNORMAL HIGH (ref 11.6–15.2)

## 2012-06-16 MED ORDER — METOPROLOL SUCCINATE ER 100 MG PO TB24
100.0000 mg | ORAL_TABLET | Freq: Every day | ORAL | Status: DC
  Administered 2012-06-17 – 2012-06-19 (×3): 100 mg via ORAL
  Filled 2012-06-16 (×3): qty 1

## 2012-06-16 MED ORDER — WARFARIN SODIUM 10 MG PO TABS
10.0000 mg | ORAL_TABLET | Freq: Once | ORAL | Status: AC
  Administered 2012-06-16: 10 mg via ORAL
  Filled 2012-06-16: qty 1

## 2012-06-16 MED ORDER — ENALAPRIL MALEATE 5 MG PO TABS
5.0000 mg | ORAL_TABLET | Freq: Two times a day (BID) | ORAL | Status: DC
  Administered 2012-06-16 – 2012-06-19 (×6): 5 mg via ORAL
  Filled 2012-06-16 (×8): qty 1

## 2012-06-16 MED ORDER — FUROSEMIDE 10 MG/ML IJ SOLN
80.0000 mg | Freq: Two times a day (BID) | INTRAMUSCULAR | Status: DC
  Administered 2012-06-16 – 2012-06-18 (×4): 80 mg via INTRAVENOUS
  Filled 2012-06-16 (×6): qty 8

## 2012-06-16 MED ORDER — SPIRONOLACTONE 50 MG PO TABS
50.0000 mg | ORAL_TABLET | Freq: Every day | ORAL | Status: DC
  Filled 2012-06-16: qty 1

## 2012-06-16 NOTE — Progress Notes (Signed)
[name]      SUBJECTIVE: Still with DOE; "I live with it"; no chest pain  Past Medical History  Diagnosis Date  . Abnormal CT of the head 12/16/1984    R parietal atrophy  . Sleep apnea, obstructive     CPAP started  . Nonischemic cardiomyopathy     EF has normalized  . Sick sinus syndrome     WITH PRIOR DDD PACEMAKER IMPLANTATION  . Hypertension   . GERD (gastroesophageal reflux disease)   . Seizure disorder   . Unspecified venous (peripheral) insufficiency   . Migraine, unspecified, without mention of intractable migraine without mention of status migrainosus   . Diaphragmatic hernia without mention of obstruction or gangrene   . Female stress incontinence   . Allergic rhinitis, cause unspecified   . Morbid obesity   . Lichenification and lichen simplex chronicus   . Anginal pain   . Shortness of breath   . Pneumonia   . Pacemaker   . Heart murmur   . Stroke   . CHF (congestive heart failure)   . H/O hiatal hernia     two removed  . Seizures     two this week,   . Arthritis   . Anemia     PHYSICAL EXAM Filed Vitals:   06/16/12 0500 06/16/12 0834 06/16/12 1056 06/16/12 1453  BP: 84/71 118/75 99/65 94/74   Pulse: 81 72 70 94  Temp: 97.3 F (36.3 C)   98.3 F (36.8 C)  TempSrc: Oral   Axillary  Resp: 18   19  Height:      Weight:      SpO2: 98% 96%  99%  .Well developed, obese in no acute distress obese HENT normal Neck supple  Chest - diminshed BS bases Irregularly irregular  Abd-soft with active BS without hepatomegaly No Clubbing cyanosis 1+ edema Skin-warm and dry A & Oriented  Grossly normal sensory and motor function TELEMETRY: Reviewed telemetry pt in  afib with intermittnet pacing: HR controlled    Intake/Output Summary (Last 24 hours) at 06/16/12 1517 Last data filed at 06/16/12 1100  Gross per 24 hour  Intake   1104 ml  Output   3900 ml  Net  -2796 ml    LABS: Basic Metabolic Panel:  Lab 06/16/12 8119  06/15/12 0615 06/14/12 0555 06/13/12 0309 06/12/12 1952 06/12/12 1434  NA 133* 135 137 142 -- 144  K 3.7 3.5 3.6 4.0 -- 4.0  CL 96 97 96 99 -- 108  CO2 26 29 29 29  -- --  GLUCOSE 79 103* 97 96 -- 138*  BUN 24* 26* 20 17 -- 19  CREATININE 0.83 1.03 1.03 0.89 0.86 0.80  CALCIUM 9.2 8.7 -- -- -- --  MG -- -- -- -- -- --  PHOS -- -- -- -- -- --    CBC:  Lab 06/16/12 0601 06/15/12 0930 06/13/12 0309 06/12/12 1952 06/12/12 1434 06/12/12 1402  WBC 6.9 7.1 12.8* 8.2 -- 8.7  NEUTROABS -- -- -- -- -- 7.0  HGB 13.0 14.1 15.8* 14.5 15.6* 14.4  HCT 39.4 42.8 47.1* 44.2 46.0 44.2  MCV 90.8 90.7 92.0 92.3 -- 92.3  PLT 129* 150 141* 134* -- 131*   PROTIME:  Basename 06/16/12 0601 06/15/12 0615 06/14/12 1200  LABPROT 17.7* 16.8* 16.0*  INR 1.43 1.34 1.25      ASSESSMENT AND PLAN:  Patient Active Hospital Problem List:  CHF - acute systolic; patient remains volume overloaded; change  lasix to 80 mg IV BID; follow renal function. Change spironolactone to 50 mg po daily.   CARDIOMYOPATHY, PRIMARY, DILATED (08/10/2009) - BP borderline; change enalapril to 5 mg po BID; change lopressor to toprol 100 mg po daily.   PACEMAKER, PERMANENT (08/10/2009)   Atrial fibrillation - continue heparin and coumadin- rate improved; continue digoxin and change lopressor to toprol; DCCV 3 weeks after INR therapeutic or TEE guided if CHF persists despite diuresis and med adjustments. Signed, Olga Millers MD  06/16/2012

## 2012-06-16 NOTE — Progress Notes (Signed)
FMTS Attending Note Patient seen and examined by me this morning, she reports feeling at baseline for dyspnea, peripheral edema.  Discussed with resident team; Cardiology plan for initiation of digoxin for rate control and TEE.  Paula Compton, MD

## 2012-06-16 NOTE — Progress Notes (Signed)
Occupational Therapy Treatment Patient Details Name: Misty Gray MRN: [id number] DOB: 03-03-1951 Today's Date: 06/16/2012 Time: 8469-6295 OT Time Calculation (min): 26 min  OT Assessment / Plan / Recommendation Comments on Treatment Session Pt educated on AE for LB selfcare but needs continued practice in order to be proficient.  Still with limited endurance as well and complains of shortness of breath however dyspnea noted at only 1/5 with toileting and walking to the door and back.  Will need 3:1 for use at home over th toilet secondary to increased difficulty with sit to stand from regular toilet.    Follow Up Recommendations  No OT follow up       Equipment Recommendations  3 in 1 bedside comode       Frequency Min 2X/week   Plan Discharge plan remains appropriate    Precautions / Restrictions Precautions Precautions: Fall Restrictions Weight Bearing Restrictions: No   Pertinent Vitals/Pain O2 98% on room air at beginning of session.  Unable to obtain pulse ox reading after mobility.    ADL  Lower Body Dressing: Set up (For donning gripper socks using sockaide.) Where Assessed - Lower Body Dressing: Unsupported sitting Toilet Transfer: Performed;Min guard Toilet Transfer Method: Other (comment) (ambulating without assistive device) Toilet Transfer Equipment: Comfort height toilet;Grab bars Toileting - Clothing Manipulation and Hygiene: Simulated;Min guard Where Assessed - Toileting Clothing Manipulation and Hygiene: Sit to stand from 3-in-1 or toilet Tub/Shower Transfer Method: Not assessed Transfers/Ambulation Related to ADLs: Pt min guard assist for mobilty without use of assistive device.  Exhibits increased cervical flexion and kyphosis when walking. ADL Comments: Practiced and issued sockaide for donning socks secondary to pt not having enough flexibility to perform without AE.      OT Goals ADL Goals ADL Goal: Lower Body Dressing - Progress: Progressing toward  goals ADL Goal: Toilet Transfer - Progress: Progressing toward goals  Visit Information  Last OT Received On: 06/16/12 Assistance Needed: +1    Subjective Data  Subjective: " I've been wrestling with this IV" Patient Stated Goal: Did not state but agreeable to participate with OT.      Cognition  Overall Cognitive Status: Appears within functional limits for tasks assessed/performed Arousal/Alertness: Awake/alert Orientation Level: Appears intact for tasks assessed Behavior During Session: [facility] for tasks performed    Mobility Bed Mobility Bed Mobility: Supine to Sit Supine to Sit: 5: Supervision Details for Bed Mobility Assistance: Increased time and effort bringing LEs off of the edge of the bed then sitting up. Transfers Transfers: Sit to Stand Sit to Stand: 4: Min guard;With upper extremity assist;From toilet Stand to Sit: 4: Min guard;Without upper extremity assist;To chair/3-in-1      Balance Balance Balance Assessed: Yes Static Standing Balance Static Standing - Balance Support: No upper extremity supported Static Standing - Level of Assistance: 5: Stand by assistance Dynamic Standing Balance Dynamic Standing - Balance Support: No upper extremity supported Dynamic Standing - Level of Assistance: 5: Stand by assistance  End of Session OT - End of Session Equipment Utilized During Treatment: Gait belt Activity Tolerance: Patient limited by fatigue Patient left: in chair;with call bell/phone within reach     [facility] OTR/L 06/16/2012, 4:29 PM Pager number [phone]

## 2012-06-16 NOTE — Progress Notes (Addendum)
ANTICOAGULATION CONSULT NOTE - Initial Consult  Pharmacy Consult for Heparin + Warfarin Indication: New-onset Afib  Allergies  Allergen Reactions  . Aspirin     REACTION: urticaria  . Penicillins     REACTION: sweating, told she had a reaction in the hospital    Patient Measurements: Height: 5\' 2"  (157.5 cm) Weight: 231 lb 11.2 oz (105.098 kg) (scale B) IBW/kg (Calculated) : 50.1  Heparin Dosing Weight: 75  Vital Signs: Temp: 97.3 F (36.3 C) (07/02 0500) Temp src: Oral (07/02 0500) BP: 118/75 mmHg (07/02 0834) Pulse Rate: 72  (07/02 0834)  Labs:  Basename 06/16/12 0601 06/15/12 0930 06/15/12 0615 06/14/12 2100 06/14/12 1200 06/14/12 0555  HGB 13.0 14.1 -- -- -- --  HCT 39.4 42.8 -- -- -- --  PLT 129* 150 -- -- -- --  APTT -- -- -- -- -- --  LABPROT 17.7* -- 16.8* -- 16.0* --  INR 1.43 -- 1.34 -- 1.25 --  HEPARINUNFRC 0.66 -- 0.65 0.27* -- --  CREATININE -- -- 1.03 -- -- 1.03  CKTOTAL -- -- -- -- -- --  CKMB -- -- -- -- -- --  TROPONINI -- -- -- -- -- --    Estimated Creatinine Clearance: 64.5 ml/min (by C-G formula based on Cr of 1.03).   Medical History: Past Medical History  Diagnosis Date  . Abnormal CT of the head 12/16/1984    R parietal atrophy  . Sleep apnea, obstructive     CPAP started  . Nonischemic cardiomyopathy     EF has normalized  . Sick sinus syndrome     WITH PRIOR DDD PACEMAKER IMPLANTATION  . Hypertension   . GERD (gastroesophageal reflux disease)   . Seizure disorder   . Unspecified venous (peripheral) insufficiency   . Migraine, unspecified, without mention of intractable migraine without mention of status migrainosus   . Diaphragmatic hernia without mention of obstruction or gangrene   . Female stress incontinence   . Allergic rhinitis, cause unspecified   . Morbid obesity   . Lichenification and lichen simplex chronicus   . Anginal pain   . Shortness of breath   . Pneumonia   . Pacemaker   . Heart murmur   . Stroke   . CHF  (congestive heart failure)   . H/O hiatal hernia     two removed  . Seizures     two this week,   . Arthritis   . Anemia     Assessment: 61 y.o. F admitted with SOB and CP and found to have new-onset Afib. Pharmacy was consulted to start heparin bridge to therapeutic INR with warfarin. Heparin level therapeutic this AM. INR 1.43. No bleeding. H/H stable but plt at 129k Goal of Therapy:  INR 2-3 Heparin level 0.3-0.7 units/ml Monitor platelets by anticoagulation protocol: Yes   Plan:  Heparin drip 1200 uts/hr  Daily HL, CBC Coumadin 10mg  PO x1

## 2012-06-16 NOTE — Progress Notes (Signed)
PGY-1 Daily Progress Note Family Medicine Teaching Service [place] R. Damonta Cossey DO   Subjective:   She states she cannot sit in the bed up at home and would feel more comfortable here as she still gets SOB lying flat. Patient says she feels chest pressure with laying flat. Denies having palpitations, dizziness, SOB, abdominal pain.  Pt did not report having lightheadness during her episodes of decreased BP yesterday and this AM  Patient now able to ambulate to restroom with baseline SOB.  Objective:  Temp:  [97.3 F (36.3 C)-98.1 F (36.7 C)] 97.3 F (36.3 C) (07/02 0500) Pulse Rate:  [72-106] 72  (07/02 0834) Resp:  [18-19] 18  (07/02 0500) BP: (72-118)/(42-75) 118/75 mmHg (07/02 0834) SpO2:  [96 %-98 %] 96 % (07/02 0834) Weight:  [231 lb 11.2 oz (105.098 kg)] 231 lb 11.2 oz (105.098 kg) (07/02 0300)  Intake/Output Summary (Last 24 hours) at 06/16/62 0903 Last data filed at 06/16/12 0802  Gross per 24 hour  Intake   2064 ml  Output   2800 ml  Net   -736 ml    Gen:  NAD, cooperate, cheerful HEENT: moist mucous membranes, lips slightly dry CV: irregularly irregular rhythm  no murmurs rubs or gallops PULM:  Decreased breath sounds bilaterally in both  Breath sounds slightly more diminished on right than left ABD: soft/nontender/nondistended/normal bowel sounds EXT: 2+ edema Neuro: Alert and oriented x3  Labs and imaging:   CBC  Lab 06/16/12 0601 06/15/12 0930 06/13/12 0309  WBC 6.9 7.1 12.8*  HGB 13.0 14.1 15.8*  HCT 39.4 42.8 47.1*  PLT 129* 150 141*   BMET/CMET  Lab 06/15/12 0615 06/14/12 0555 06/13/12 0309  NA 135 137 142  K 3.5 3.6 4.0  CL 97 96 99  CO2 29 29 29   BUN 26* 20 17  CREATININE 1.03 1.03 0.89  CALCIUM 8.7 9.2 10.0  PROT -- -- 7.4  BILITOT -- -- 1.0  ALKPHOS -- -- 122*  ALT -- -- 39*  AST -- -- 38*  GLUCOSE 103* 97 96   BNP    Component Value Date/Time   PROBNP 4247.0* 06/12/2012 1402    EKG PVC along with AFib  MEDS Scheduled Meds:    .  calcium-vitamin D  1 tablet Oral BID  . digoxin  0.25 mg Oral Q6H   Followed by  . digoxin  0.125 mg Oral Daily  . enalapril  10 mg Oral BID  . fluticasone  2 spray Each Nare Daily  . furosemide  80 mg Oral BID  . metoprolol  50 mg Oral BID  . pantoprazole  40 mg Oral Q1200  . PHENobarbital  97.2 mg Oral QHS  . phenytoin  300 mg Oral q morning - 10a  . phenytoin  400 mg Oral QHS  . sodium chloride  3 mL Intravenous Q12H  . sodium chloride  3 mL Intravenous Q12H  . spironolactone  50 mg Oral BID  . warfarin  7.5 mg Oral ONCE-1800  . Warfarin - Pharmacist Dosing Inpatient   Does not apply q1800  . DISCONTD: furosemide  80 mg Intravenous BID  . DISCONTD: metoprolol  50 mg Oral BID  . DISCONTD: metoprolol  75 mg Oral BID   Continuous Infusions:    . heparin 1,200 Units/hr (06/15/12 1245)   PRN Meds:.sodium chloride, acetaminophen, ondansetron (ZOFRAN) IV, ondansetron, sodium chloride   Assessment  Misty Gray is a 61 y.o. year old female with known CHF (EF 45-50%) and seizure  disorder presenting with shortness of breath and cough with labs and imaging consistent with CHF exacerbation.   1. Acute on Chronic Systolic CHF exacerbation: continues to improve. secondary to medication non-compliance as went almost 2 weeks without medications. Last echo in 2010 showed EF of 45-50% with inferior hypokinesis. Pending Echo done on 06/12/12.  CXR consistent with acute exacerbation with pleural effusions and vascular congestion.   - will continue to diurese with home spironolactone  -Cardiology questions why her Aldactone is such a high dose and will need to be addressed before d/c   -Lasix 80 mg PO BID  -BP dropped to 80 systolic on 7/1 PM when given Lopressor dose so Lopressor held and BP 118 systolic at 0800AM on 7/2     2. New onset A fib-seen on EKG x2. On my read of telemetry-appears patient intermittently in a. Fib as well with HR noted into 120s at times.   -Cardiology believes she is  not having adequate rate control and added digoxin   -Also would think about TEE DCCV before   - Started on coumadin per dosing by pharmacy   3.Chest pain: Likely related to acute CHF exacerbation especially given pressure with laying flat. POC troponin and EKG not concerning in ED. However, was relieved some with SL nitroglycerin. CE negative x2.    # Seizure disorder: No reported seizure activity recently. Dilantin and phenytoin levels are low. continued home medications. No seizure activity and patient has not been taking medications.  # GERD: Stable; continue home medications.  FEN/GI: heart healthy diet; SLIV  Prophylaxis: SQ lovenox  Disposition: Pt was scheduled to go home on 7/1 PM but pacemaker interrogated and noted not to be catching every beat.  Also, BP dropped to 80 systolic and lopressor needed to be held. Cardiology is thinking about TEE with DCCV at this time so awaiting f/u with cards with this.   Gildardo Cranker DO 06/15/12 9:23 AM

## 2012-06-17 ENCOUNTER — Encounter (HOSPITAL_COMMUNITY): Payer: Self-pay | Admitting: Internal Medicine

## 2012-06-17 ENCOUNTER — Ambulatory Visit: Payer: Medicare Other

## 2012-06-17 LAB — BASIC METABOLIC PANEL
BUN: 25 mg/dL — ABNORMAL HIGH (ref 6–23)
Chloride: 94 mEq/L — ABNORMAL LOW (ref 96–112)
GFR calc Af Amer: 83 mL/min — ABNORMAL LOW (ref >90)
GFR calc non Af Amer: 72 mL/min — ABNORMAL LOW (ref >90)
Potassium: 4.6 mEq/L (ref 3.5–5.1)

## 2012-06-17 LAB — PROTIME-INR
INR: 1.71 — ABNORMAL HIGH (ref 0.00–1.49)
Prothrombin Time: 20.4 seconds — ABNORMAL HIGH (ref 11.6–15.2)

## 2012-06-17 LAB — HEPARIN LEVEL (UNFRACTIONATED)
Heparin Unfractionated: 0.8 IU/mL — ABNORMAL HIGH (ref 0.30–0.70)
Heparin Unfractionated: 0.83 IU/mL — ABNORMAL HIGH (ref 0.30–0.70)

## 2012-06-17 LAB — CBC
MCHC: 32.5 g/dL (ref 30.0–36.0)
Platelets: 131 10*3/uL — ABNORMAL LOW (ref 150–400)
RDW: 14.4 % (ref 11.5–15.5)
WBC: 7.2 10*3/uL (ref 4.0–10.5)

## 2012-06-17 MED ORDER — HEPARIN (PORCINE) IN NACL 100-0.45 UNIT/ML-% IJ SOLN
850.0000 [IU]/h | INTRAMUSCULAR | Status: DC
  Filled 2012-06-17 (×2): qty 250

## 2012-06-17 MED ORDER — WARFARIN SODIUM 10 MG PO TABS
10.0000 mg | ORAL_TABLET | Freq: Once | ORAL | Status: AC
  Administered 2012-06-17: 10 mg via ORAL
  Filled 2012-06-17: qty 1

## 2012-06-17 MED ORDER — SPIRONOLACTONE 25 MG PO TABS
25.0000 mg | ORAL_TABLET | Freq: Every day | ORAL | Status: DC
  Administered 2012-06-17 – 2012-06-19 (×3): 25 mg via ORAL
  Filled 2012-06-17 (×3): qty 1

## 2012-06-17 NOTE — Progress Notes (Signed)
ANTICOAGULATION CONSULT NOTE - Initial Consult  Pharmacy Consult for Heparin + Warfarin Indication: New-onset Afib  Allergies  Allergen Reactions  . Aspirin     REACTION: urticaria  . Penicillins     REACTION: sweating, told she had a reaction in the hospital    Patient Measurements: Height: 5\' 2"  (157.5 cm) Weight: 227 lb 14.4 oz (103.375 kg) (scale B) IBW/kg (Calculated) : 50.1  Heparin Dosing Weight: 75  Vital Signs: Temp: 97.5 F (36.4 C) (07/03 0555) Temp src: Oral (07/03 0555) BP: 122/57 mmHg (07/03 1021) Pulse Rate: 91  (07/03 1021)  Labs:  Basename 06/17/12 0550 06/16/12 0940 06/16/12 0601 06/15/12 0930 06/15/12 0615  HGB 14.1 -- 13.0 -- --  HCT 43.4 -- 39.4 42.8 --  PLT 131* -- 129* 150 --  APTT -- -- -- -- --  LABPROT 20.4* -- 17.7* -- 16.8*  INR 1.71* -- 1.43 -- 1.34  HEPARINUNFRC 0.80* -- 0.66 -- 0.65  CREATININE 0.85 0.83 -- -- 1.03  CKTOTAL -- -- -- -- --  CKMB -- -- -- -- --  TROPONINI -- -- -- -- --    Estimated Creatinine Clearance: 77.4 ml/min (by C-G formula based on Cr of 0.85).   Medical History: Past Medical History  Diagnosis Date  . Abnormal CT of the head 12/16/1984    R parietal atrophy  . Sleep apnea, obstructive     CPAP started  . Nonischemic cardiomyopathy     EF has normalized-repeat Pending   . Sick sinus syndrome     WITH PRIOR DDD PACEMAKER IMPLANTATION  . Hypertension   . GERD (gastroesophageal reflux disease)   . Seizure disorder   . Unspecified venous (peripheral) insufficiency   . Migraine, unspecified, without mention of intractable migraine without mention of status migrainosus   . Diaphragmatic hernia without mention of obstruction or gangrene   . Female stress incontinence   . Allergic rhinitis, cause unspecified   . Morbid obesity   . Lichenification and lichen simplex chronicus   . Atrial fibrillation   . Shortness of breath   . Pneumonia   . Pacemaker  st judes   . Heart murmur   . Stroke   . CHF  (congestive heart failure)   . H/O hiatal hernia     two removed  . Seizures     two this week,   . Arthritis   . Anemia     Assessment: 61 y.o. F admitted with SOB and CP and found to have new-onset Afib. Pharmacy was consulted to start heparin bridge to therapeutic INR with warfarin. Heparin level supratherapeutic this AM at 0.8. INR 1.71. No bleeding. H/H stable but plt at 131k Goal of Therapy:  INR 2-3 Heparin level 0.3-0.7 units/ml Monitor platelets by anticoagulation protocol: Yes   Plan:  Decrease heparin drip to 1050 uts/hr  Daily HL, CBC Coumadin 10mg  PO x1

## 2012-06-17 NOTE — Progress Notes (Signed)
FMTS Attending Note  Patient seen and examined by me; she reports relatively unchanged with regard to exertional dyspnea.  No chest pain.  In good spirits.  Continues in AF rate-controlled on telemetry.   Remarks that she is prescribed CPAP at home for nighttime use, but the apparatus has broken and is unable to use for the past 3 months.   Plan for 2D ECHO today and reduction in daily aldactone per cardiology note; continued diuresis and likely discharged in morning.  To address CPAP machine (she reports she got hers from [facility]).   Paula Compton, MD

## 2012-06-17 NOTE — Discharge Planning (Cosign Needed)
Physician Discharge Summary  Patient ID: Misty Gray MRN: [id number] DOB: 02-22-1951 Age: 61 y.o.  Admit date: 06/12/2012 Discharge date: 06/18/2012 Admitting Physician: Nestor Ramp, MD  PCP: Tobin Chad, MD  Consultants: Cardiology     Discharge Diagnosis:  Principal Problem:  *Acute CHF Active Problems:  CARDIOMYOPATHY, PRIMARY, DILATED  CHF - EJECTION FRACTION < 50%  GASTROESOPHAGEAL REFLUX, NO ESOPHAGITIS  CONVULSIONS, SEIZURES, NOS  APNEA, SLEEP  PACEMAKER, PERMANENT  New onset atrial fibrillation    Hospital Course Misty Gray is a 61 y.o. year old female with known CHF (EF 45-50%) and seizure disorder presenting with shortness of breath and cough with labs and imaging consistent with CHF exacerbation and also found to be in new onset atrial fibrillation.   1. Acute on Chronic CHF exacerbation: CXR consistent with acute exacerbation with pleural effusions and vascular congestion along with elevated BNP. Symptoms improved throughout stay with aggressive diuresis with IV lasix 80mg  BID and restarting home spironolactone. Restarted home lasix at discharge with 80mg  BID and decreased her aldactone to 25 mg qd. Exacerbation secondary to medication non-compliance as went almost 2 weeks without medications. Last echo in 2010 showed EF of 45-50% with inferior hypokinesis. Repeat echocardiogram ordered 06/13/12 showing EF of 25-30% with increased inferior hypokenesis. Patient was continued on Lopressor and Enalapril but pt had episodes of hypotension so her enalapril was decreased to 5 mg qd, she was switched to Toprol-XL 100 mg PO qd, and added digoxin 0.125 mg PO qd to increase contraction of her heart without the BP effects of the B-Blocker.    2. New onset A fib-likely secondary to CHF exacerbation and medication noncompliance. Typically rate controlled with metorpolol throughout stay with the addition of digoxin two days into her hospital course. Cardiology consulted and  recommended coumadin for anticoagulation and DCCV 3 weeks after INR therapeutic or a TEE if Sx persist for CHF.Monitored on telemetry throughout stay and sent home on coumadin 7.5 mg to be closely followed.  INR was therapeutic x 2 days before d/c home.  3.Chest pain: Likely related to acute CHF exacerbation especially given pressure with laying flat. POC troponin and EKG not concerning in ED. However, was relieved some with SL nitroglycerin. CE negative x2. Patient with pacemaker in place and actively followed by cardiology.   4. Seizure disorder: No reported seizure activity recently. Dilantin and phenytoin levels were low due to noncompliance. Restarted home medications.   5. GERD: Stable; continue home medications.   6. OSA: Pt has longstanding OSA and was put on CPAP while in the hospital.  Her last machine broke around 3 months ago and will have to take to distributor to have replaced.          Discharge Physical Exam  Filed Vitals:   06/19/12 0607  BP: 111/81  Pulse: 70  Temp: 98 F (36.7 C)  Resp: 18    Gen:  NAD, cooperate, cheerful HEENT: moist mucous membranes, lips slightly dry CV: very distant heart sounds, no murmurs rubs or gallops auscultated, Irregular Irregular rhythm  PULM:  Decreased breath sounds but no increased WOB.  No evidence of crackles in LL B/L which is improved from previouis exam ABD: soft/nontender/nondistended/obese/normal bowel sounds, mildy tender diffusely EXT: 1+ pitting edema Neuro: Alert and oriented x3      Procedures/Imaging:  Dg Chest 2 View  06/12/2012  *RADIOLOGY REPORT*  Clinical Data: Shortness of breath/respiratory distress  CHEST - 2 VIEW  Comparison: 05/21/2011  Findings: Cardiomegaly with pulmonary  vascular congestion and suspected mild interstitial edema.  Bilateral lower lobe opacities, right greater than left, likely a combination of atelectasis and pleural effusions.  Underlying right lower lobe pneumonia is not excluded.  No  pneumothorax.  Right subclavian pacemaker.  Degenerative changes of the visualized thoracolumbar spine.  IMPRESSION: Cardiomegaly with pulmonary vascular congestion and suspected mild interstitial edema.  Bilateral lower lobe opacities, right greater than left, likely a combination of atelectasis and pleural effusions.  Underlying right lower lobe pneumonia is not excluded.  Original Report Authenticated By: Charline Bills, M.D.   2-D Echo 06/13/12 Left ventricle: There is akinesis of the entire inferior wall and akinesis of the entire inferolateral wall. The other walls are mildly hypokinestic. There is dyssynergy of the septum. These wall abnormalities are worse than the prior study (although the current images are better and there was hypokinesis of the inferior and inferolateral walls on the prior study. The cavity size was at the upper limits of normal. Wall thickness was normal. Systolic function was severely reduced. The estimated ejection fraction was in the range of 25% to 30%.   Aortic valve: Sclerosis without stenosis. Doppler: No significant regurgitation.  Aorta: Aortic root: The aortic root was normal in size.  Mitral valve: There is calcification of the some the mitral chordae. Doppler: Mild to moderate regurgitation. Peak gradient: 3mm Hg (D).  Left atrium: The atrium was moderately dilated.  Right ventricle: The cavity size was mildly dilated. Pacer wire or catheter noted in right ventricle. Systolic function was mildly reduced.  Pulmonic valve: The valve appears to be grossly normal. Doppler: No significant regurgitation.  Tricuspid valve: The valve appears to be grossly normal. Doppler: Mild regurgitation.  Right atrium: The atrium was mildly dilated.  Pericardium: There was no pericardial effusion.     Labs  CBC  Lab 06/19/12 0515 06/18/12 0648 06/17/12 0550  WBC 7.9 7.6 7.2  HGB 14.8 14.3 14.1  HCT 44.7 43.0 43.4  PLT 142* 153 131*    BMET  Lab 06/18/12 0648 06/17/12 0550 06/16/12 0940 06/13/12 0309  NA 133* 135 133* --  K 5.1 4.6 3.7 --  CL 93* 94* 96 --  CO2 31 32 26 --  BUN 31* 25* 24* --  CREATININE 0.93 0.85 0.83 --  CALCIUM 9.6 9.6 9.2 --  PROT -- -- -- 7.4  BILITOT -- -- -- 1.0  ALKPHOS -- -- -- 122*  ALT -- -- -- 39*  AST -- -- -- 38*  GLUCOSE 107* 95 79 --   Results for orders placed during the hospital encounter of 06/12/12 (from the past 72 hour(s))  BASIC METABOLIC PANEL     Status: Abnormal   Collection Time   06/16/12  9:40 AM      Component Value Range Comment   Sodium 133 (*) 135 - 145 mEq/L    Potassium 3.7  3.5 - 5.1 mEq/L    Chloride 96  96 - 112 mEq/L    CO2 26  19 - 32 mEq/L    Glucose, Bld 79  70 - 99 mg/dL    BUN 24 (*) 6 - 23 mg/dL    Creatinine, Ser 1.61  0.50 - 1.10 mg/dL    Calcium 9.2  8.4 - 09.6 mg/dL    GFR calc non Af Amer 74 (*) >90 mL/min    GFR calc Af Amer 86 (*) >90 mL/min   HEPARIN LEVEL (UNFRACTIONATED)     Status: Abnormal   Collection Time   06/17/12  5:50 AM      Component Value Range Comment   Heparin Unfractionated 0.80 (*) 0.30 - 0.70 IU/mL   PROTIME-INR     Status: Abnormal   Collection Time   06/17/12  5:50 AM      Component Value Range Comment   Prothrombin Time 20.4 (*) 11.6 - 15.2 seconds    INR 1.71 (*) 0.00 - 1.49   CBC     Status: Abnormal   Collection Time   06/17/12  5:50 AM      Component Value Range Comment   WBC 7.2  4.0 - 10.5 K/uL    RBC 4.71  3.87 - 5.11 MIL/uL    Hemoglobin 14.1  12.0 - 15.0 g/dL    HCT 16.1  09.6 - 04.5 %    MCV 92.1  78.0 - 100.0 fL    MCH 29.9  26.0 - 34.0 pg    MCHC 32.5  30.0 - 36.0 g/dL    RDW 40.9  81.1 - 91.4 %    Platelets 131 (*) 150 - 400 K/uL   BASIC METABOLIC PANEL     Status: Abnormal   Collection Time   06/17/12  5:50 AM      Component Value Range Comment   Sodium 135  135 - 145 mEq/L    Potassium 4.6  3.5 - 5.1 mEq/L    Chloride 94 (*) 96 - 112 mEq/L    CO2 32  19 - 32 mEq/L    Glucose, Bld 95  70  - 99 mg/dL    BUN 25 (*) 6 - 23 mg/dL    Creatinine, Ser 7.82  0.50 - 1.10 mg/dL    Calcium 9.6  8.4 - 95.6 mg/dL    GFR calc non Af Amer 72 (*) >90 mL/min    GFR calc Af Amer 83 (*) >90 mL/min   HEPARIN LEVEL (UNFRACTIONATED)     Status: Abnormal   Collection Time   06/17/12  5:30 PM      Component Value Range Comment   Heparin Unfractionated 0.83 (*) 0.30 - 0.70 IU/mL   HEPARIN LEVEL (UNFRACTIONATED)     Status: Normal   Collection Time   06/18/12  6:48 AM      Component Value Range Comment   Heparin Unfractionated 0.31  0.30 - 0.70 IU/mL   PROTIME-INR     Status: Abnormal   Collection Time   06/18/12  6:48 AM      Component Value Range Comment   Prothrombin Time 28.3 (*) 11.6 - 15.2 seconds    INR 2.60 (*) 0.00 - 1.49   CBC     Status: Normal   Collection Time   06/18/12  6:48 AM      Component Value Range Comment   WBC 7.6  4.0 - 10.5 K/uL    RBC 4.73  3.87 - 5.11 MIL/uL    Hemoglobin 14.3  12.0 - 15.0 g/dL    HCT 21.3  08.6 - 57.8 %    MCV 90.9  78.0 - 100.0 fL    MCH 30.2  26.0 - 34.0 pg    MCHC 33.3  30.0 - 36.0 g/dL    RDW 46.9  62.9 - 52.8 %    Platelets 153  150 - 400 K/uL   BASIC METABOLIC PANEL     Status: Abnormal   Collection Time   06/18/12  6:48 AM      Component Value Range Comment   Sodium 133 (*) 135 -  145 mEq/L    Potassium 5.1  3.5 - 5.1 mEq/L    Chloride 93 (*) 96 - 112 mEq/L    CO2 31  19 - 32 mEq/L    Glucose, Bld 107 (*) 70 - 99 mg/dL    BUN 31 (*) 6 - 23 mg/dL    Creatinine, Ser 1.47  0.50 - 1.10 mg/dL    Calcium 9.6  8.4 - 82.9 mg/dL    GFR calc non Af Amer 65 (*) >90 mL/min    GFR calc Af Amer 75 (*) >90 mL/min   PROTIME-INR     Status: Abnormal   Collection Time   06/19/12  5:15 AM      Component Value Range Comment   Prothrombin Time 28.6 (*) 11.6 - 15.2 seconds    INR 2.64 (*) 0.00 - 1.49   CBC     Status: Abnormal   Collection Time   06/19/12  5:15 AM      Component Value Range Comment   WBC 7.9  4.0 - 10.5 K/uL    RBC 4.91  3.87 - 5.11  MIL/uL    Hemoglobin 14.8  12.0 - 15.0 g/dL    HCT 56.2  13.0 - 86.5 %    MCV 91.0  78.0 - 100.0 fL    MCH 30.1  26.0 - 34.0 pg    MCHC 33.1  30.0 - 36.0 g/dL    RDW 78.4  69.6 - 29.5 %    Platelets 142 (*) 150 - 400 K/uL        Patient condition at time of discharge/disposition: stable Disposition-Home   Follow up issues: 1. Please follow up symptoms of CHF. Please make sure patient compliant with CHF/CV meds as well as going over results from her Echo from 06/13/12.  Cardiology did switch her medications to Toprol XR 100 mg PO, Digoxin 0.125 mg PO, and Enalapril to 5 mg BID along with decreasing her aldactone to 25mg  qd PO. Continue on Lasix 80 mg BID. Please check a digoxin level at this visit as well (7-10 days after the start of this medication on June 16, 2012) 2. Please follow up compliance with seizure medications.  3. Please follow up with coumadin dosing and if patient has self-converted from Afib. If not, she should have scheduled an appointment to see [facility] within the next two weeks.  She should have her INR checked as well per lab procedures. 4. Please follow up with her OSA.  She will need to take her CPAP machine to the distributor where she got it to have it fixed.  Discharge follow up:  Follow-up Information    Follow up with Tobin Chad, MD in 8 days.   Contact information:   [address] [place] [address] [phone]       Follow up with [facility] in 2 days.        Please call to make an appt. With [facility] at [phone]    Discharge Instructions: Please refer to Patient Instructions section of EMR for full details.  Patient was counseled important signs and symptoms that should prompt return to medical care, changes in medications, dietary instructions, activity restrictions, and follow up appointments.  Significant instructions noted below:  Discharge Orders    Future Appointments: Provider:  Department: Dept Phone: Center:   06/25/2012 10:00 AM [facility] [phone] [facility]   07/14/2012 2:30 PM Tobin Chad, MD [facility] [phone] [facility]  Future Orders Please Complete By Expires   Diet - low sodium heart healthy      Increase activity slowly      Call MD for:  temperature >100.4      Call MD for:  difficulty breathing, headache or visual disturbances      (HEART FAILURE PATIENTS) Call MD:  Anytime you have any of the following symptoms: 1) 3 pound weight gain in 24 hours or 5 pounds in 1 week 2) shortness of breath, with or without a dry hacking cough 3) swelling in the hands, feet or stomach 4) if you have to sleep on extra pillows at night in order to breathe.         Discharge Medications Medication List  As of 06/19/2012  8:42 AM   START taking these medications         digoxin 0.125 MG tablet   Commonly known as: LANOXIN   Take 1 tablet (0.125 mg total) by mouth daily.      * enalapril 5 MG tablet   Commonly known as: VASOTEC   Take 1 tablet (5 mg total) by mouth 2 (two) times daily.      metoprolol succinate 100 MG 24 hr tablet   Commonly known as: TOPROL-XL   Take 1 tablet (100 mg total) by mouth daily. Take with or immediately following a meal.      * spironolactone 25 MG tablet   Commonly known as: ALDACTONE   Take 1 tablet (25 mg total) by mouth daily.      warfarin 7.5 MG tablet   Commonly known as: COUMADIN   Please take as directed by coumadin clinic     * Notice: This list has 2 medication(s) that are the same as other medications prescribed for you. Read the directions carefully, and ask your doctor or other care provider to review them with you.       CHANGE how you take these medications         * enalapril 20 MG tablet   Commonly known as: VASOTEC   TAKE 1 TABLET EVERY MORNING AND 1/2 TABLET INTHE EVENING   What changed: See the new instructions.      furosemide 80 MG tablet   Commonly known as:  LASIX   Take 1 tablet (80 mg total) by mouth 2 (two) times daily.   What changed: - medication strength - dose - route (how to take the med) - how often to take the med - doctor's instructions      omeprazole 20 MG capsule   Commonly known as: PRILOSEC   Take 1 capsule (20 mg total) by mouth daily.   What changed: See the new instructions.      phenytoin 100 MG ER capsule   Commonly known as: DILANTIN   TAKE 3 CAPSULES EVERY MORNING AND 4 CAPSULES EVERY P.M.   What changed: See the new instructions.      * spironolactone 50 MG tablet   Commonly known as: ALDACTONE   Take 1 tablet (50 mg total) by mouth 2 (two) times daily.   What changed: See the new instructions.     * Notice: This list has 2 medication(s) that are the same as other medications prescribed for you. Read the directions carefully, and ask your doctor or other care provider to review them with you.       CONTINUE taking these medications         CALTRATE 600+D 600-400 MG-UNIT per  chew tablet   Generic drug: Calcium Carbonate-Vitamin D      FLONASE 50 MCG/ACT nasal spray   Generic drug: fluticasone      halobetasol 0.05 % ointment   Commonly known as: ULTRAVATE   Apply topically 2 (two) times daily. Apply a small amount to skin twice a day      PHENobarbital 32.4 MG tablet   Commonly known as: LUMINAL   Take 3 tablets (97.2 mg total) by mouth at bedtime.         STOP taking these medications         metoprolol 50 MG tablet          Where to get your medications    These are the prescriptions that you need to pick up. We sent them to a specific pharmacy, so you will need to go there to get them.   [id number] [place], [place] - [address] [facility] [address]    [address] [place] [address]    Phone: [phone]        digoxin 0.125 MG tablet   enalapril 20 MG tablet   enalapril 5 MG tablet   furosemide 80 MG tablet   metoprolol succinate 100 MG 24 hr tablet    omeprazole 20 MG capsule   phenytoin 100 MG ER capsule   spironolactone 25 MG tablet   spironolactone 50 MG tablet   warfarin 7.5 MG tablet         Information on where to get these meds is not yet available. Ask your nurse or doctor.         PHENobarbital 32.4 MG tablet                Gildardo Cranker, DO of [facility] 06/17/2012 9:23 PM

## 2012-06-17 NOTE — Progress Notes (Signed)
[name]      SUBJECTIVE: shortness of breath,no better but fat albert no longer sitting on her chest  Past Medical History  Diagnosis Date  . Abnormal CT of the head 12/16/1984    R parietal atrophy  . Sleep apnea, obstructive     CPAP started  . Nonischemic cardiomyopathy     EF has normalized  . Sick sinus syndrome     WITH PRIOR DDD PACEMAKER IMPLANTATION  . Hypertension   . GERD (gastroesophageal reflux disease)   . Seizure disorder   . Unspecified venous (peripheral) insufficiency   . Migraine, unspecified, without mention of intractable migraine without mention of status migrainosus   . Diaphragmatic hernia without mention of obstruction or gangrene   . Female stress incontinence   . Allergic rhinitis, cause unspecified   . Morbid obesity   . Lichenification and lichen simplex chronicus   . Anginal pain   . Shortness of breath   . Pneumonia   . Pacemaker   . Heart murmur   . Stroke   . CHF (congestive heart failure)   . H/O hiatal hernia     two removed  . Seizures     two this week,   . Arthritis   . Anemia     PHYSICAL EXAM Filed Vitals:   06/16/12 1617 06/16/12 1900 06/16/12 2124 06/17/12 0555  BP:  97/65 114/96 116/83  Pulse: 89 73 83 97  Temp:   97.8 F (36.6 C) 97.5 F (36.4 C)  TempSrc:   Oral Oral  Resp:   18 18  Height:      Weight:    227 lb 14.4 oz (103.375 kg)  SpO2: 98%  94% 100%    Well developed and nourished morbidly obese in no acute distress HENT normal Neck supple Clear .irregular  rate and rhythm, no murmurs or gallops Abd-soft with active BS No Clubbing cyanosis 2+ edema Skin-warm and dry A & Oriented  Grossly normal sensory and motor function   TELEMETRY: Reviewed telemetry pt in afib with hr  avg about 85:    Intake/Output Summary (Last 24 hours) at 06/17/12 0744 Last data filed at 06/17/12 0713  Gross per 24 hour  Intake 1622.6 ml  Output   5100 ml  Net -3477.4 ml   Net -13L   Is this for  real with only4 lb weight loss LABS: Basic Metabolic Panel:  Lab 06/17/12 2956 06/16/12 0940 06/15/12 0615 06/14/12 0555 06/13/12 0309 06/12/12 1952 06/12/12 1434  NA 135 133* 135 137 142 -- 144  K 4.6 3.7 3.5 3.6 4.0 -- 4.0  CL 94* 96 97 96 99 -- 108  CO2 32 26 29 29 29  -- --  GLUCOSE 95 79 103* 97 96 -- 138*  BUN 25* 24* 26* 20 17 -- 19  CREATININE 0.85 0.83 1.03 1.03 0.89 0.86 0.80  CALCIUM 9.6 9.2 -- -- -- -- --  MG -- -- -- -- -- -- --  PHOS -- -- -- -- -- -- --   Cardiac Enzymes: No results found for this basename: CKTOTAL:3,CKMB:3,CKMBINDEX:3,TROPONINI:3 in the last 72 hours CBC:  Lab 06/17/12 0550 06/16/12 0601 06/15/12 0930 06/13/12 0309 06/12/12 1952 06/12/12 1434 06/12/12 1402  WBC 7.2 6.9 7.1 12.8* 8.2 -- 8.7  NEUTROABS -- -- -- -- -- -- 7.0  HGB 14.1 13.0 14.1 15.8* 14.5 15.6* 14.4  HCT 43.4 39.4 42.8 47.1* 44.2 46.0 44.2  MCV 92.1 90.8 90.7 92.0 92.3 --  92.3  PLT 131* 129* 150 141* 134* -- 131*   PROTIME:  Basename 06/17/12 0550 06/16/12 0601 06/15/12 0615  LABPROT 20.4* 17.7* 16.8*  INR 1.71* 1.43 1.34     Basename 06/14/12 1200  TSH 0.777  T4TOTAL --  T3FREE --  THYROIDAB --   Anemia Panel: No results found for this basename: VITAMINB12,FOLATE,FERRITIN,TIBC,IRON,RETICCTPCT in the last 72 hours   Device Interrogation   ASSESSMENT AND PLAN:  Patient Active Hospital Problem List: Acute CHF (06/12/2012)   CARDIOMYOPATHY, PRIMARY, DILATED (08/10/2009)       PACEMAKER, PERMANENT (08/10/2009)   New onset atrial fibrillation (06/14/2012)   Continue IV diuresis for one more day  Would decrease aldactone to 25 mg daily Echo reordered hopefyully home in am with close followup   Signed, Sherryl Manges MD  06/17/2012

## 2012-06-17 NOTE — Progress Notes (Signed)
PGY-1 Daily Progress Note Family Medicine Teaching Service    Subjective:   Pt doing well today, sitting up in bed, talkative, denies SOB, chest pain, PND, and syncope overnight or this morning.  Complains of some right sided abdominal pain from her scar tissue.  States her leg edema is back to normal.  Objective:  Temp:  [97.5 F (36.4 C)-98.3 F (36.8 C)] 97.5 F (36.4 C) (07/03 0555) Pulse Rate:  [70-97] 82  (07/03 0857) Resp:  [18-19] 18  (07/03 0555) BP: (94-116)/(65-96) 113/66 mmHg (07/03 0857) SpO2:  [94 %-100 %] 100 % (07/03 0555) Weight:  [227 lb 14.4 oz (103.375 kg)] 227 lb 14.4 oz (103.375 kg) (07/03 0555)  Intake/Output Summary (Last 24 hours) at 06/17/12 0859 Last data filed at 06/17/12 0713  Gross per 24 hour  Intake 1262.6 ml  Output   5100 ml  Net -3837.4 ml    Gen:  NAD, cooperate, cheerful HEENT: moist mucous membranes, lips slightly dry CV: very distant heart sounds, no murmurs rubs or gallops auscultated PULM:  Decreased breath sounds bilaterally in both  Lower lobes with mild crackling but no increased WOB. ABD: soft/nontender/nondistended/obese/normal bowel sounds, mildy tender diffusely EXT: 2+ pitting edema Neuro: Alert and oriented x3  Labs and imaging:   CBC  Lab 06/17/12 0550 06/16/12 0601 06/15/12 0930  WBC 7.2 6.9 7.1  HGB 14.1 13.0 14.1  HCT 43.4 39.4 42.8  PLT 131* 129* 150   BMET/CMET  Lab 06/17/12 0550 06/16/12 0940 06/15/12 0615 06/13/12 0309  NA 135 133* 135 --  K 4.6 3.7 3.5 --  CL 94* 96 97 --  CO2 32 26 29 --  BUN 25* 24* 26* --  CREATININE 0.85 0.83 1.03 --  CALCIUM 9.6 9.2 8.7 --  PROT -- -- -- 7.4  BILITOT -- -- -- 1.0  ALKPHOS -- -- -- 122*  ALT -- -- -- 39*  AST -- -- -- 38*  GLUCOSE 95 79 103* --   BNP    Component Value Date/Time   PROBNP 4247.0* 06/12/2012 1402   INR 1.71 EKG PVC along with AFib  MEDS Meds have been reviewed.  Assessment  Misty Gray is a 61 y.o. year old female with known CHF (EF  45-50%) and seizure disorder presenting with shortness of breath and cough with labs and imaging consistent with CHF exacerbation.   1. Acute on Chronic Systolic CHF exacerbation: continues to improve. secondary to medication non-compliance as went almost 2 weeks without medications. Last echo in 2010 showed EF of 45-50% with inferior hypokinesis. Pending Echo done on 06/12/12.  CXR consistent with acute exacerbation with pleural effusions and vascular congestion.   - will continue to diurese with home spironolactone  -Per cardiology 7/3, decrease aldactone to 25 mg daily.   -Lasix 80 mg PO BID today and re-evaluate tomorrow (7/4) with likely discharge  -BP dropped to 80 systolic on 7/1 PM when given Lopressor dose so Lopressor held and BP 118 systolic at 0800AM on 7/2  - Per medicine team recs, DCCV 3 weeks after INR therapeutic or TEE guided if CHF persists despite diuresis and meds  - Close f/u with [facility]ology  - Ordered CM c/s to help with getting new CPAP machine at home.  2. New onset A fib-seen on EKG x2. On my read of telemetry-appears patient intermittently in a. Fib as well with HR noted into 120s at times.   -Cardiology believes she is not having adequate rate control and added  digoxin   -Also would think about TEE DCCV before   - Started on coumadin per dosing by pharmacy and should take at home once discharged.  - f/u further cardiology recs, esp re: if want continued lovenox at home   3.Chest pain: Likely related to acute CHF exacerbation especially given pressure with laying flat. POC troponin and EKG not concerning in ED. However, was relieved some with SL nitroglycerin. CE negative x2.    # Seizure disorder: No reported seizure activity recently. Dilantin and phenytoin levels are low. continued home medications. No seizure activity and patient has not been taking medications.  # GERD: Stable; continue home medications.   FEN/GI: heart healthy diet; SLIV  Prophylaxis:  SQ lovenox  Disposition:  Pt was scheduled to go home on 7/1 PM but pacemaker interrogated and noted not to be catching every beat.  Also, BP dropped to 80 systolic and lopressor needed to be held. Cardiology is thinking about TEE with DCCV at this time so awaiting f/u with cards with this.  On 7/3 Cardiology recommended IV diuresing one more day, obtaining echocardiogram, and going home likely tomorrow AM with close f/u.  F/u echo.    Simone Curia MD 06/15/12 9:23 AM

## 2012-06-17 NOTE — Progress Notes (Signed)
ANTICOAGULATION CONSULT NOTE - Follow Up Consult  Pharmacy Consult for  Heparin and Coumadin Indication: atrial fibrillation  Allergies  Allergen Reactions  . Aspirin     REACTION: urticaria  . Penicillins     REACTION: sweating, told she had a reaction in the hospital    Patient Measurements: Height: 5\' 2"  (157.5 cm) Weight: 227 lb 14.4 oz (103.375 kg) (scale B) IBW/kg (Calculated) : 50.1  Heparin Dosing Weight: 75 kg  Vital Signs: Temp: 97.6 F (36.4 C) (07/03 1400) Temp src: Oral (07/03 1400) BP: 99/58 mmHg (07/03 1857) Pulse Rate: 81  (07/03 1857)  Labs:  Basename 06/17/12 1730 06/17/12 0550 06/16/12 0940 06/16/12 0601 06/15/12 0930 06/15/12 0615  HGB -- 14.1 -- 13.0 -- --  HCT -- 43.4 -- 39.4 42.8 --  PLT -- 131* -- 129* 150 --  APTT -- -- -- -- -- --  LABPROT -- 20.4* -- 17.7* -- 16.8*  INR -- 1.71* -- 1.43 -- 1.34  HEPARINUNFRC 0.83* 0.80* -- 0.66 -- --  CREATININE -- 0.85 0.83 -- -- 1.03  CKTOTAL -- -- -- -- -- --  CKMB -- -- -- -- -- --  TROPONINI -- -- -- -- -- --    Estimated Creatinine Clearance: 77.4 ml/min (by C-G formula based on Cr of 0.85).  Assessment:   Heparin rate decreased from 1200 to 1050 units/hr earlier today, but heparin level remains supratherapeutic.   Coumadin 10 mg given tonight (4th dose of Coumadin).  Goal of Therapy:  INR 2-3 Heparin level 0.3-0.7 units/ml Monitor platelets by anticoagulation protocol: Yes   Plan:   Decrease heparin drip to 850 units/hr.  Next heparin level, PT/INR and CBC in am.  Dennie Fetters, RPh Pager: [phone] 06/17/2012,8:20 PM

## 2012-06-18 LAB — PROTIME-INR
INR: 2.6 — ABNORMAL HIGH (ref 0.00–1.49)
Prothrombin Time: 28.3 seconds — ABNORMAL HIGH (ref 11.6–15.2)

## 2012-06-18 LAB — CBC
HCT: 43 % (ref 36.0–46.0)
Hemoglobin: 14.3 g/dL (ref 12.0–15.0)
MCH: 30.2 pg (ref 26.0–34.0)
MCHC: 33.3 g/dL (ref 30.0–36.0)
RDW: 14.2 % (ref 11.5–15.5)

## 2012-06-18 LAB — BASIC METABOLIC PANEL
BUN: 31 mg/dL — ABNORMAL HIGH (ref 6–23)
Calcium: 9.6 mg/dL (ref 8.4–10.5)
GFR calc Af Amer: 75 mL/min — ABNORMAL LOW (ref >90)
GFR calc non Af Amer: 65 mL/min — ABNORMAL LOW (ref >90)
Glucose, Bld: 107 mg/dL — ABNORMAL HIGH (ref 70–99)
Potassium: 5.1 mEq/L (ref 3.5–5.1)
Sodium: 133 mEq/L — ABNORMAL LOW (ref 135–145)

## 2012-06-18 MED ORDER — ENOXAPARIN (LOVENOX) PATIENT EDUCATION KIT
PACK | Freq: Once | Status: DC
  Filled 2012-06-18: qty 1

## 2012-06-18 MED ORDER — WARFARIN SODIUM 1 MG PO TABS
1.0000 mg | ORAL_TABLET | Freq: Once | ORAL | Status: AC
  Administered 2012-06-18: 1 mg via ORAL
  Filled 2012-06-18: qty 1

## 2012-06-18 MED ORDER — FUROSEMIDE 10 MG/ML IJ SOLN
80.0000 mg | Freq: Two times a day (BID) | INTRAMUSCULAR | Status: DC
  Administered 2012-06-18 – 2012-06-19 (×2): 80 mg via INTRAVENOUS
  Filled 2012-06-18 (×4): qty 8

## 2012-06-18 NOTE — Progress Notes (Signed)
Patient ID: Misty Gray, female   DOB: 1951-07-15, 61 y.o.   MRN: [id number]   SUBJECTIVE: Patient is in good spirits this morning. She tells me that she has been told already this morning that she'll be going home. The prior notes suggest that she could be considered for discharge today. She has significant diuresis yesterday. She still has edema.   Filed Vitals:   06/17/12 1400 06/17/12 1857 06/17/12 2117 06/18/12 0521  BP: 112/56 99/58 117/67 104/59  Pulse: 78 81 74 80  Temp: 97.6 F (36.4 C)  98.3 F (36.8 C) 97.1 F (36.2 C)  TempSrc: Oral  Oral Oral  Resp: 18  18 18   Height:      Weight:    224 lb 13.9 oz (102 kg)  SpO2: 98%  99% 100%    Intake/Output Summary (Last 24 hours) at 06/18/12 0819 Last data filed at 06/18/12 0003  Gross per 24 hour  Intake   1240 ml  Output   5000 ml  Net  -3760 ml    LABS: Basic Metabolic Panel:  Basename 06/17/12 0550 06/16/12 0940  NA 135 133*  K 4.6 3.7  CL 94* 96  CO2 32 26  GLUCOSE 95 79  BUN 25* 24*  CREATININE 0.85 0.83  CALCIUM 9.6 9.2  MG -- --  PHOS -- --   Liver Function Tests: No results found for this basename: AST:2,ALT:2,ALKPHOS:2,BILITOT:2,PROT:2,ALBUMIN:2 in the last 72 hours No results found for this basename: LIPASE:2,AMYLASE:2 in the last 72 hours CBC:  Basename 06/18/12 0648 06/17/12 0550  WBC 7.6 7.2  NEUTROABS -- --  HGB 14.3 14.1  HCT 43.0 43.4  MCV 90.9 92.1  PLT 153 131*   Cardiac Enzymes: No results found for this basename: CKTOTAL:3,CKMB:3,CKMBINDEX:3,TROPONINI:3 in the last 72 hours BNP: No components found with this basename: POCBNP:3 D-Dimer: No results found for this basename: DDIMER:2 in the last 72 hours Hemoglobin A1C: No results found for this basename: HGBA1C in the last 72 hours Fasting Lipid Panel: No results found for this basename: CHOL,HDL,LDLCALC,TRIG,CHOLHDL,LDLDIRECT in the last 72 hours Thyroid Function Tests: No results found for this basename:  TSH,T4TOTAL,FREET3,T3FREE,THYROIDAB in the last 72 hours  RADIOLOGY: Dg Chest 2 View  06/12/2012  *RADIOLOGY REPORT*  Clinical Data: Shortness of breath/respiratory distress  CHEST - 2 VIEW  Comparison: 05/21/2011  Findings: Cardiomegaly with pulmonary vascular congestion and suspected mild interstitial edema.  Bilateral lower lobe opacities, right greater than left, likely a combination of atelectasis and pleural effusions.  Underlying right lower lobe pneumonia is not excluded.  No pneumothorax.  Right subclavian pacemaker.  Degenerative changes of the visualized thoracolumbar spine.  IMPRESSION: Cardiomegaly with pulmonary vascular congestion and suspected mild interstitial edema.  Bilateral lower lobe opacities, right greater than left, likely a combination of atelectasis and pleural effusions.  Underlying right lower lobe pneumonia is not excluded.  Original Report Authenticated By: Charline Bills, M.D.    PHYSICAL EXAM  Patient is overweight. Lungs reveal basilar arousal. Cardiac exam reveals S1 and S2. There no clicks or significant murmurs. The abdomen is protuberant. There is peripheral edema.   TELEMETRY:  The rhythm is paced.   ASSESSMENT AND PLAN:   *Acute CHF    The patient has continued volume overload. She needs further diuresis. I am concerned that if she is discharged she will return rapidly to the hospital. It may be optimal to keep her in the hospital longer so that we can diaries further with IV meds.  Willa Rough 06/18/2012 8:19 AM

## 2012-06-18 NOTE — Progress Notes (Signed)
FMTS Attending Note Patient seen and examined by me, see my separate note for more information.  Paula Compton, MD

## 2012-06-18 NOTE — Progress Notes (Addendum)
ANTICOAGULATION CONSULT NOTE - Follow Up Consult  Pharmacy Consult for  Heparin and Coumadin Indication: atrial fibrillation  Allergies  Allergen Reactions  . Aspirin     REACTION: urticaria  . Penicillins     REACTION: sweating, told she had a reaction in the hospital    Patient Measurements: Height: 5\' 2"  (157.5 cm) Weight: 224 lb 13.9 oz (102 kg) (Scale B.) IBW/kg (Calculated) : 50.1  Heparin Dosing Weight: 75 kg  Vital Signs: Temp: 97.1 F (36.2 C) (07/04 0521) Temp src: Oral (07/04 0521) BP: 104/59 mmHg (07/04 0521) Pulse Rate: 80  (07/04 0521)  Labs:  Basename 06/18/12 1610 06/17/12 1730 06/17/12 0550 06/16/12 0940 06/16/12 0601  HGB 14.3 -- 14.1 -- --  HCT 43.0 -- 43.4 -- 39.4  PLT 153 -- 131* -- 129*  APTT -- -- -- -- --  LABPROT 28.3* -- 20.4* -- 17.7*  INR 2.60* -- 1.71* -- 1.43  HEPARINUNFRC 0.31 0.83* 0.80* -- --  CREATININE -- -- 0.85 0.83 --  CKTOTAL -- -- -- -- --  CKMB -- -- -- -- --  TROPONINI -- -- -- -- --    Estimated Creatinine Clearance: 76.8 ml/min (by C-G formula based on Cr of 0.85).  Assessment:  INR = 2.6 today and it did take a pretty good jump. Heparin therapeutic this AM. On anticoagulation for afib. CBC wnl. Will give a very small dose of coumadin today since INR took a big jump today. Will DC heparin today since INR is therapeutic.  Goal of Therapy:  INR = 2-3 Monitor platelets by anticoagulation protocol: Yes   Plan:  Dc heparin  Coumadin 1mg  PO x1 (If dc home today, start coumadin 5mg  qday tomorrow and check INR Monday) Daily INR

## 2012-06-18 NOTE — Progress Notes (Addendum)
   CARE MANAGEMENT NOTE 06/18/2012  Patient:  NONIE, LOCHNER   Account Number:  [id number]  Date Initiated:  06/18/2012  Documentation initiated by:  Ilan Kahrs  Subjective/Objective Assessment:   Pt with broken CPAP and DME needs.     Action/Plan:   Met with pt and family who will go to [facility] with CPAP for repair. Pt is on home O2 at night. 3:1 commode ordered and have asked MD to order [facility] for CHF program.   Anticipated DC Date:  06/19/2012   Anticipated DC Plan:  HOME W HOME HEALTH SERVICES         Choice offered to / List presented to:  C-1 Patient   DME arranged  3-N-1      DME agency  [facility]        Status of service:  In process, will continue to follow Medicare Important Message given?   (If response is "NO", the following Medicare IM given date fields will be blank) Date Medicare IM given:   Date Additional Medicare IM given:    Discharge Disposition:  HOME W HOME HEALTH SERVICES  Per UR Regulation:    If discussed at Long Length of Stay Meetings, dates discussed:    Comments:  06/18/2012 Met with pt and family, they understand that the CPAP must be repaired, the CM is unable to order a new one. Son states that he will go to [facility] for possible repair. 3:1 commode ordered and have asked MD to order [facility] for CHF program. Pt is agreeable to [facility] and CHF program. Johny Shock RN MPH [phone]   Order for [facility] for CHF program, pt in agreement, [facility] will provide [facility] and CHF program. [facility] unable to eval CPAP while pt is in the hospital, family will go to [facility] office. MD notified. Johny Shock RN MPH Case Manager [phone]

## 2012-06-18 NOTE — Progress Notes (Signed)
PGY-1 Daily Progress Note Family Medicine Teaching Service    Subjective:   Pt doing well today, sitting up in bed, talkative, denies SOB, chest pain, and syncope overnight or this morning.  Did have PND lastnight but states this is her baseline and usually has trouble at home.  States her leg edema is back to normal.   Objective:  Temp:  [97.1 F (36.2 C)-98.3 F (36.8 C)] 97.1 F (36.2 C) (07/04 0521) Pulse Rate:  [74-91] 80  (07/04 0521) Resp:  [18] 18  (07/04 0521) BP: (99-122)/(56-67) 104/59 mmHg (07/04 0521) SpO2:  [98 %-100 %] 100 % (07/04 0521) Weight:  [224 lb 13.9 oz (102 kg)] 224 lb 13.9 oz (102 kg) (07/04 0521)  Intake/Output Summary (Last 24 hours) at 06/18/12 0728 Last data filed at 06/18/12 0003  Gross per 24 hour  Intake   1240 ml  Output   5000 ml  Net  -3760 ml    Gen:  NAD, cooperate, cheerful HEENT: moist mucous membranes, lips slightly dry CV: very distant heart sounds, no murmurs rubs or gallops auscultated, Irregular Irregular rhythm  PULM:  Decreased breath sounds bilaterally in both  Lower lobes with mild crackling but no increased WOB. ABD: soft/nontender/nondistended/obese/normal bowel sounds, mildy tender diffusely EXT: 2+ pitting edema Neuro: Alert and oriented x3  Labs and imaging:   CBC  Lab 06/17/12 0550 06/16/12 0601 06/15/12 0930  WBC 7.2 6.9 7.1  HGB 14.1 13.0 14.1  HCT 43.4 39.4 42.8  PLT 131* 129* 150   BMET/CMET  Lab 06/17/12 0550 06/16/12 0940 06/15/12 0615 06/13/12 0309  NA 135 133* 135 --  K 4.6 3.7 3.5 --  CL 94* 96 97 --  CO2 32 26 29 --  BUN 25* 24* 26* --  CREATININE 0.85 0.83 1.03 --  CALCIUM 9.6 9.2 8.7 --  PROT -- -- -- 7.4  BILITOT -- -- -- 1.0  ALKPHOS -- -- -- 122*  ALT -- -- -- 39*  AST -- -- -- 38*  GLUCOSE 95 79 103* --   BNP    Component Value Date/Time   PROBNP 4247.0* 06/12/2012 1402    Lab 06/17/12 0550 06/16/12 0601 06/15/12 0615  INR 1.71* 1.43 1.34     EKG PVC along with AFib  ECHO  06/13/12: - Left ventricle: There is akinesis of the entire inferior wall and akinesis of the entire inferolateral wall. The other walls are mildly hypokinestic. There is dyssynergy of the septum. These wall abnormalities are worse than the prior study (although the current images are better and there was hypokinesis of the inferior and inferolateral walls on the prior study. The cavity size was at the upper limits of normal. Wall thickness was normal. Systolic function was severely reduced. The estimated ejection fraction was in the range of 25% to 30%.  MEDS: Scheduled Meds:   . calcium-vitamin D  1 tablet Oral BID  . digoxin  0.125 mg Oral Daily  . enalapril  5 mg Oral BID  . fluticasone  2 spray Each Nare Daily  . furosemide  80 mg Intravenous BID  . metoprolol succinate  100 mg Oral Daily  . pantoprazole  40 mg Oral Q1200  . PHENobarbital  97.2 mg Oral QHS  . phenytoin  300 mg Oral q morning - 10a  . phenytoin  400 mg Oral QHS  . sodium chloride  3 mL Intravenous Q12H  . sodium chloride  3 mL Intravenous Q12H  . spironolactone  25 mg Oral  Daily  . warfarin  10 mg Oral ONCE-1800  . Warfarin - Pharmacist Dosing Inpatient   Does not apply q1800  . DISCONTD: spironolactone  50 mg Oral Daily   Continuous Infusions:   . heparin 850 Units/hr (06/17/12 2044)  . DISCONTD: heparin 1,200 Units/hr (06/17/12 0713)   PRN Meds:.sodium chloride, acetaminophen, ondansetron (ZOFRAN) IV, ondansetron, sodium chloride  Assessment  Misty Gray is a 61 y.o. year old female with known CHF (EF 45-50%) and seizure disorder presenting with shortness of breath and cough with labs and imaging consistent with CHF exacerbation.   1. Acute on Chronic Systolic CHF exacerbation: continues to improve. secondary to medication non-compliance as went almost 2 weeks without medications. Last echo in 2010 showed EF of 45-50% with inferior hypokinesis. Echo on 6/29 shows worsening inferior wall hypokinesis  along with decreased EF of 25-30% LV.  CXR consistent with acute exacerbation with pleural effusions and vascular congestion.   -Per cardiology 7/3, decrease aldactone to 25 mg daily.   -Lasix 80 mg IV BID today and re-evaluate (7/4) with likely discharge on Lasix 80 mg tid PO. Will reassess before possible d/c today if not signs of fluid overload -BP dropped to 80 systolic on 7/1 PM when given Lopressor dose so Lopressor held and BP 118 systolic at 0800AM on 7/2.  Cards recommended Toprol XR 100 mg qd   - Per medicine team recs, DCCV 3 weeks after INR therapeutic or TEE guided if CHF persists despite diuresis and meds  - Close f/u with [facility] cardiology  - Ordered CM c/s to help with getting new CPAP machine at home.  2. New onset A fib-seen on EKG x2. On my read of telemetry-appears patient intermittently in a. Fib as well with HR noted into 120s at times.   -Cardiology believes she is not having adequate rate control and added digoxin   -Also would think about TEE DCCV in 3 weeks as ouptpt  - Started on coumadin per dosing by pharmacy and should take at home once discharged.  - f/u further cardiology recs, esp re: if want continued lovenox at home   3.Chest pain: Likely related to acute CHF exacerbation especially given pressure with laying flat. POC troponin and EKG not concerning in ED. However, was relieved some with SL nitroglycerin. CE negative x2.    # Seizure disorder: No reported seizure activity recently. Dilantin and phenytoin levels are low. continued home medications. No seizure activity and patient has not been taking medications.  # GERD: Stable; continue home medications.   FEN/GI: heart healthy diet; SLIV  Prophylaxis: SQ lovenox  Disposition:  Pt was scheduled to go home on 7/1 PM but pacemaker interrogated and noted not to be catching every beat.    On 7/3 Cardiology recommended IV diuresing one more day, obtaining echocardiogram, and going home likely 7/4 AM with close  f/u.  Will diuresis her one more time before reassess and possible d/c later this afternoon.  Echo findings show worsening chronic systolic heart failure due to dilated cardiomyopathy and will need close f/u with cards as an outpt for 1) Acute on Chronic systolic CHF  And 2) Atrial Fibrillation DCCV   Gildardo Cranker DO 06/15/12 9:23 AM

## 2012-06-18 NOTE — Progress Notes (Signed)
I interviewed and examined this patient and discussed the care plan with Dr. Paulina Fusi and the [facility] team and agree with assessment and plan as documented in the progress note for today. I am her outpatient physician. Reviewing my office note from this past May she weighed 242 at that time. Her edema in her ankles is about what she has chronically. Since her INR is therapeutic, I believe she can be discharged tomorrow without Lovenox. I don't treat people with tid furosemide due to nocturia. I would use a larger dose of bid furosemide. Schedule her in geriatric clinic next Thursday for me to see her there.     Margarie Mcguirt A. Sheffield Slider, MD Family Medicine Teaching Service Attending  06/18/2012 11:28 AM

## 2012-06-19 ENCOUNTER — Ambulatory Visit: Payer: Medicare Other

## 2012-06-19 ENCOUNTER — Telehealth: Payer: Self-pay | Admitting: Family Medicine

## 2012-06-19 LAB — PROTIME-INR: Prothrombin Time: 28.6 seconds — ABNORMAL HIGH (ref 11.6–15.2)

## 2012-06-19 LAB — CBC
MCH: 30.1 pg (ref 26.0–34.0)
MCHC: 33.1 g/dL (ref 30.0–36.0)
Platelets: 142 10*3/uL — ABNORMAL LOW (ref 150–400)

## 2012-06-19 MED ORDER — WARFARIN SODIUM 5 MG PO TABS: ORAL_TABLET | ORAL | Status: DC

## 2012-06-19 MED ORDER — METOPROLOL SUCCINATE ER 100 MG PO TB24: 100.0000 mg | ORAL_TABLET | Freq: Every day | ORAL | Status: DC

## 2012-06-19 MED ORDER — DIGOXIN 125 MCG PO TABS: 0.1250 mg | ORAL_TABLET | Freq: Every day | ORAL | Status: DC

## 2012-06-19 MED ORDER — WARFARIN SODIUM 5 MG PO TABS
5.0000 mg | ORAL_TABLET | Freq: Every day | ORAL | Status: DC
  Filled 2012-06-19: qty 1

## 2012-06-19 MED ORDER — SPIRONOLACTONE 25 MG PO TABS: 25.0000 mg | ORAL_TABLET | Freq: Every day | ORAL | Status: DC

## 2012-06-19 MED ORDER — ENALAPRIL MALEATE 5 MG PO TABS: 5.0000 mg | ORAL_TABLET | Freq: Two times a day (BID) | ORAL | Status: DC

## 2012-06-19 NOTE — Progress Notes (Signed)
ANTICOAGULATION CONSULT NOTE - Follow Up Consult  Pharmacy Consult for  Heparin and Coumadin Indication: atrial fibrillation  Allergies  Allergen Reactions  . Aspirin     REACTION: urticaria  . Penicillins     REACTION: sweating, told she had a reaction in the hospital    Patient Measurements: Height: 5\' 2"  (157.5 cm) Weight: 222 lb 0.1 oz (100.7 kg) IBW/kg (Calculated) : 50.1  Heparin Dosing Weight: 75 kg  Vital Signs: Temp: 98 F (36.7 C) (07/05 0607) Temp src: Oral (07/05 0607) BP: 111/81 mmHg (07/05 0607) Pulse Rate: 66  (07/05 0946)  Labs:  Basename 06/19/12 0515 06/18/12 0648 06/17/12 1730 06/17/12 0550  HGB 14.8 14.3 -- --  HCT 44.7 43.0 -- 43.4  PLT 142* 153 -- 131*  APTT -- -- -- --  LABPROT 28.6* 28.3* -- 20.4*  INR 2.64* 2.60* -- 1.71*  HEPARINUNFRC -- 0.31 0.83* 0.80*  CREATININE -- 0.93 -- 0.85  CKTOTAL -- -- -- --  CKMB -- -- -- --  TROPONINI -- -- -- --    Estimated Creatinine Clearance: 69.6 ml/min (by C-G formula based on Cr of 0.93).  Assessment:  INR = 2.64 today and it did take a pretty good jump. Pt ready to be dced  Goal of Therapy:  INR = 2-3 Monitor platelets by anticoagulation protocol: Yes   Plan:  Coumadin 5mg  PO qday. Check INR Monday if dc today. Daily INR

## 2012-06-19 NOTE — Progress Notes (Signed)
Patient ID: Misty Gray, female   DOB: Aug 05, 1951, 61 y.o.   MRN: [id number]   Appreciate the very helpful note from Dr.Hale yesterday. He knows this patient well and is able to provide optimal continuity in terms of her care. Nothing else I can add from the cardiology viewpoint today. We will sign off.  Jerral Bonito, MD

## 2012-06-19 NOTE — Telephone Encounter (Signed)
Called pt. Left message to call back. (per Hospital note: Coumadin 5 mg daily) .Arlyss Repress

## 2012-06-19 NOTE — Progress Notes (Signed)
FMTS Attending Note  Patient seen by me; I agree with plans for discharge to home, with close follow up in Geriatrics Clinic with Dr. Sheffield Slider next week.  CPAP to be addressed once patient is home.  Paula Compton, MD

## 2012-06-19 NOTE — Progress Notes (Signed)
I interviewed and examined this patient and discussed the care plan with Dr. Paulina Fusi and the [facility] team and agree with assessment and plan as documented in the progress note for today. Her chest is clear and apical rate is 76. Minimal edema. She tells me that her home CPAP is broken so we will need to get her respiratory supplier to her home to replace this and to make sure her O2 is functioning.She can be discharged when that is arranged and I would like to see her in Geriatric clinic next week.   Camaria Gerald A. Sheffield Slider, MD Family Medicine Teaching Service Attending  06/19/2012 8:58 AM

## 2012-06-19 NOTE — Telephone Encounter (Signed)
Patient's husband called to clarify coumadin dose. Read to him the she is prescribed 5 mg daily. He voiced understanding.

## 2012-06-19 NOTE — Discharge Instructions (Signed)
You were admitted to the hospital for worsening of your heart failure causing increased shortness of breath and swelling.  While in the hospital you developed atrial fibrillation, where your heart will beat randomly.    Please make sure to take your Lasix 80 mg two times per day and your spironolactone 25 mg once per day and your digoxin 0.125 mg once per day to help prevent swelling and possibly coming back to the hospital.  An echocardiogram of your heart was performed during your hospital stay to evaluate how your heart was beating.  Your primary care physician will be able to go over the results of this test with you at your visit.  You have an appointment with the coumadin clinic at [facility] on 06/22/2012.  You will need to call the lab to confirm this at [phone].  Also, you have an appointment with your primary care physician, Misty Gray, on 06/25/2012 at 10AM.  Please make an appointment with [facility] within the next three days.  Call [phone] .  They want you to make an appointment to see you within the next three weeks.  Activity as tolerated but be sure not to do anything that makes you too short of breath  Please limit the amount of salt in your diet to around 2 grams per day (1 tsp).  Also stay hydrated with water while not overhydrating yourself (Your urine should be light yellow).  If you start to experience dizziness, loss of consciousness, increased swelling in your legs, increased shortness of breath, weakness in your face or extremities, please call your primary care physician or return to the emergency room.

## 2012-06-19 NOTE — Progress Notes (Addendum)
PGY-1 Daily Progress Note Family Medicine Teaching Service    Subjective:   Pt doing well today, sitting up in bed, talkative, denies SOB, chest pain, and syncope overnight or this morning.  Pt is ready to go home and her swelling she states is back to normal.  Objective:  Temp:  [97.9 F (36.6 C)-98.6 F (37 C)] 98 F (36.7 C) (07/05 0607) Pulse Rate:  [70-82] 70  (07/05 0607) Resp:  [18] 18  (07/05 0607) BP: (84-111)/(43-81) 111/81 mmHg (07/05 0607) SpO2:  [99 %-100 %] 99 % (07/05 0607) Weight:  [222 lb 0.1 oz (100.7 kg)] 222 lb 0.1 oz (100.7 kg) (07/05 9604)  Intake/Output Summary (Last 24 hours) at 06/19/12 0824 Last data filed at 06/19/12 0817  Gross per 24 hour  Intake  873.6 ml  Output   1700 ml  Net -826.4 ml    Gen:  NAD, cooperate, cheerful HEENT: moist mucous membranes, lips slightly dry CV: very distant heart sounds, no murmurs rubs or gallops auscultated, Irregular Irregular rhythm  PULM:  Decreased breath sounds bilaterally in both  Lung Exam Imrpvoed with no evidence of crackles at lung bases, no increased WOB. ABD: soft/nontender/nondistended/obese/normal bowel sounds, mildy tender diffusely EXT: 1+ edema Neuro: Alert and oriented x3  Labs and imaging:   CBC  Lab 06/19/12 0515 06/18/12 0648 06/17/12 0550  WBC 7.9 7.6 7.2  HGB 14.8 14.3 14.1  HCT 44.7 43.0 43.4  PLT 142* 153 131*   BMET/CMET  Lab 06/18/12 0648 06/17/12 0550 06/16/12 0940 06/13/12 0309  NA 133* 135 133* --  K 5.1 4.6 3.7 --  CL 93* 94* 96 --  CO2 31 32 26 --  BUN 31* 25* 24* --  CREATININE 0.93 0.85 0.83 --  CALCIUM 9.6 9.6 9.2 --  PROT -- -- -- 7.4  BILITOT -- -- -- 1.0  ALKPHOS -- -- -- 122*  ALT -- -- -- 39*  AST -- -- -- 38*  GLUCOSE 107* 95 79 --   BNP    Component Value Date/Time   PROBNP 4247.0* 06/12/2012 1402    Lab 06/19/12 0515 06/18/12 0648 06/17/12 0550  INR 2.64* 2.60* 1.71*     EKG PVC along with AFib  ECHO 06/13/12: - Left ventricle: There is  akinesis of the entire inferior wall and akinesis of the entire inferolateral wall. The other walls are mildly hypokinestic. There is dyssynergy of the septum. These wall abnormalities are worse than the prior study (although the current images are better and there was hypokinesis of the inferior and inferolateral walls on the prior study. The cavity size was at the upper limits of normal. Wall thickness was normal. Systolic function was severely reduced. The estimated ejection fraction was in the range of 25% to 30%.  MEDS: Scheduled Meds:    . calcium-vitamin D  1 tablet Oral BID  . digoxin  0.125 mg Oral Daily  . enalapril  5 mg Oral BID  . enoxaparin   Does not apply Once  . fluticasone  2 spray Each Nare Daily  . furosemide  80 mg Intravenous BID  . metoprolol succinate  100 mg Oral Daily  . pantoprazole  40 mg Oral Q1200  . PHENobarbital  97.2 mg Oral QHS  . phenytoin  300 mg Oral q morning - 10a  . phenytoin  400 mg Oral QHS  . sodium chloride  3 mL Intravenous Q12H  . sodium chloride  3 mL Intravenous Q12H  . spironolactone  25 mg  Oral Daily  . warfarin  1 mg Oral ONCE-1800  . Warfarin - Pharmacist Dosing Inpatient   Does not apply q1800  . DISCONTD: furosemide  80 mg Intravenous BID   Continuous Infusions:    . DISCONTD: heparin 850 Units/hr (06/17/12 2044)   PRN Meds:.sodium chloride, acetaminophen, ondansetron (ZOFRAN) IV, ondansetron, sodium chloride  Assessment  Misty Gray is a 61 y.o. year old female with known CHF (EF 45-50%) and seizure disorder presenting with shortness of breath and cough with labs and imaging consistent with CHF exacerbation.   1. Acute on Chronic Systolic CHF exacerbation: continues to improve. secondary to medication non-compliance as went almost 2 weeks without medications. Last echo in 2010 showed EF of 45-50% with inferior hypokinesis. Echo on 6/29 shows worsening inferior wall hypokinesis along with decreased EF of 25-30% LV.   CXR consistent with acute exacerbation with pleural effusions and vascular congestion.   -Per cardiology 7/3, decrease aldactone to 25 mg daily.   -Lasix 80 mg IV BID today  with likely discharge today on Lasix 80 mg bid PO. Pt is back to her baseline and cardiology as signed off at this point.  -Cards recommended Toprol XR 100 mg qd subsisting for lopressor 50 mg bid  - Per medicine team recs, DCCV 3 weeks after INR therapeutic or TEE guided if CHF persists despite diuresis and meds  - Close f/u with [facility] cardiology  2. New onset A fib-seen on EKG x2. On my read of telemetry-appears patient intermittently in a. Fib as well with HR noted into 120s at times.   -Cardiology believes she is not having adequate rate control and added digoxin   -Also would think about TEE DCCV in 3 weeks as ouptpt  - Started on coumadin per dosing by pharmacy and should take at home once discharged.  Appt for coumadin clinic scheduled for 7/8 AM     3.Chest pain: Likely related to acute CHF exacerbation especially given pressure with laying flat. POC troponin and EKG not concerning in ED. However, was relieved some with SL nitroglycerin. CE negative x2.    # Seizure disorder: No reported seizure activity recently. Dilantin and phenytoin levels are low. continued home medications. No seizure activity and patient has not been taking medications.  # GERD: Stable; continue home medications.   FEN/GI: heart healthy diet; SLIV  Prophylaxis: Coumadin now therapeutic x 2 days Disposition:  Pt feels that she is ready to go home, she has lost 20 lbs since admission, and cardiology has signed off with no further recommedations.  Will d/c today and have close f/u with our outpt clinic as well as cardiology within the next two weeks.   Gildardo Cranker DO 06/15/12 9:23 AM   Pt is arranged to go home today. Her CPAP machine is currently broken.  Spoke with home health who will not go to fix the machine at her house until the  patient is d/c and back home.  Home Health is currently arranged to go to her house after she is d/c to fix the machine.

## 2012-06-19 NOTE — Telephone Encounter (Signed)
Patient was discharged from the hospital today but there were no instruction for dose or frequency of Coumadin.

## 2012-06-19 NOTE — Progress Notes (Signed)
Nursing Note: Pt to be discharged per MD's order. IV and cardiac monitor has been removed from pt. Family and pt have received all discharge information including CHF education and verbalized understanding. Will escort pt to car safely. Jamonica Schoff Scientist, clinical (histocompatibility and immunogenetics).

## 2012-06-22 ENCOUNTER — Ambulatory Visit (INDEPENDENT_AMBULATORY_CARE_PROVIDER_SITE_OTHER): Payer: Medicare Other | Admitting: Family Medicine

## 2012-06-22 ENCOUNTER — Observation Stay (HOSPITAL_COMMUNITY): Payer: Medicare Other

## 2012-06-22 ENCOUNTER — Encounter (HOSPITAL_COMMUNITY): Payer: Self-pay | Admitting: General Practice

## 2012-06-22 ENCOUNTER — Ambulatory Visit (INDEPENDENT_AMBULATORY_CARE_PROVIDER_SITE_OTHER): Payer: Medicare Other | Admitting: *Deleted

## 2012-06-22 ENCOUNTER — Inpatient Hospital Stay (HOSPITAL_COMMUNITY)
Admission: AD | Admit: 2012-06-22 | Discharge: 2012-06-25 | DRG: 092 | Disposition: A | Discharge status: Home Health | Payer: Medicare Other | Source: Ambulatory Visit | Attending: Family Medicine | Admitting: Family Medicine

## 2012-06-22 VITALS — Temp 97.5°F | Wt 227.8 lb

## 2012-06-22 DIAGNOSIS — I10 Essential (primary) hypertension: Secondary | ICD-10-CM | POA: Diagnosis present

## 2012-06-22 DIAGNOSIS — Z5181 Encounter for therapeutic drug level monitoring: Secondary | ICD-10-CM

## 2012-06-22 DIAGNOSIS — Z79899 Other long term (current) drug therapy: Secondary | ICD-10-CM

## 2012-06-22 DIAGNOSIS — Z886 Allergy status to analgesic agent status: Secondary | ICD-10-CM

## 2012-06-22 DIAGNOSIS — R0609 Other forms of dyspnea: Secondary | ICD-10-CM

## 2012-06-22 DIAGNOSIS — R5383 Other fatigue: Secondary | ICD-10-CM | POA: Diagnosis present

## 2012-06-22 DIAGNOSIS — Z808 Family history of malignant neoplasm of other organs or systems: Secondary | ICD-10-CM

## 2012-06-22 DIAGNOSIS — G4733 Obstructive sleep apnea (adult) (pediatric): Secondary | ICD-10-CM | POA: Diagnosis present

## 2012-06-22 DIAGNOSIS — R2681 Unsteadiness on feet: Secondary | ICD-10-CM

## 2012-06-22 DIAGNOSIS — R0600 Dyspnea, unspecified: Secondary | ICD-10-CM

## 2012-06-22 DIAGNOSIS — I4891 Unspecified atrial fibrillation: Secondary | ICD-10-CM | POA: Diagnosis present

## 2012-06-22 DIAGNOSIS — Z8673 Personal history of transient ischemic attack (TIA), and cerebral infarction without residual deficits: Secondary | ICD-10-CM

## 2012-06-22 DIAGNOSIS — R55 Syncope and collapse: Secondary | ICD-10-CM

## 2012-06-22 DIAGNOSIS — K5900 Constipation, unspecified: Secondary | ICD-10-CM | POA: Diagnosis present

## 2012-06-22 DIAGNOSIS — I509 Heart failure, unspecified: Secondary | ICD-10-CM | POA: Diagnosis present

## 2012-06-22 DIAGNOSIS — Z823 Family history of stroke: Secondary | ICD-10-CM

## 2012-06-22 DIAGNOSIS — I959 Hypotension, unspecified: Secondary | ICD-10-CM

## 2012-06-22 DIAGNOSIS — R269 Unspecified abnormalities of gait and mobility: Secondary | ICD-10-CM

## 2012-06-22 DIAGNOSIS — Z950 Presence of cardiac pacemaker: Secondary | ICD-10-CM

## 2012-06-22 DIAGNOSIS — Y92009 Unspecified place in unspecified non-institutional (private) residence as the place of occurrence of the external cause: Secondary | ICD-10-CM

## 2012-06-22 DIAGNOSIS — G40909 Epilepsy, unspecified, not intractable, without status epilepticus: Secondary | ICD-10-CM | POA: Diagnosis present

## 2012-06-22 DIAGNOSIS — Z7901 Long term (current) use of anticoagulants: Secondary | ICD-10-CM

## 2012-06-22 DIAGNOSIS — I5022 Chronic systolic (congestive) heart failure: Secondary | ICD-10-CM | POA: Diagnosis present

## 2012-06-22 DIAGNOSIS — Z8261 Family history of arthritis: Secondary | ICD-10-CM

## 2012-06-22 DIAGNOSIS — Z66 Do not resuscitate: Secondary | ICD-10-CM | POA: Diagnosis present

## 2012-06-22 DIAGNOSIS — Z880 Allergy status to penicillin: Secondary | ICD-10-CM

## 2012-06-22 DIAGNOSIS — E7800 Pure hypercholesterolemia, unspecified: Secondary | ICD-10-CM | POA: Diagnosis present

## 2012-06-22 DIAGNOSIS — Z8249 Family history of ischemic heart disease and other diseases of the circulatory system: Secondary | ICD-10-CM

## 2012-06-22 DIAGNOSIS — Z833 Family history of diabetes mellitus: Secondary | ICD-10-CM

## 2012-06-22 DIAGNOSIS — Z8049 Family history of malignant neoplasm of other genital organs: Secondary | ICD-10-CM

## 2012-06-22 DIAGNOSIS — T420X5A Adverse effect of hydantoin derivatives, initial encounter: Secondary | ICD-10-CM | POA: Diagnosis present

## 2012-06-22 HISTORY — DX: Pure hypercholesterolemia, unspecified: E78.00

## 2012-06-22 HISTORY — DX: Other fatigue: R53.83

## 2012-06-22 HISTORY — DX: Dyspnea, unspecified: R06.00

## 2012-06-22 HISTORY — DX: Syncope and collapse: R55

## 2012-06-22 HISTORY — DX: Other forms of dyspnea: R06.09

## 2012-06-22 LAB — BASIC METABOLIC PANEL
GFR calc Af Amer: 62 mL/min — ABNORMAL LOW (ref >90)
GFR calc non Af Amer: 53 mL/min — ABNORMAL LOW (ref >90)
Potassium: 4.7 mEq/L (ref 3.5–5.1)
Sodium: 131 mEq/L — ABNORMAL LOW (ref 135–145)

## 2012-06-22 LAB — CBC
Hemoglobin: 14.2 g/dL (ref 12.0–15.0)
MCHC: 33.6 g/dL (ref 30.0–36.0)
RBC: 4.75 MIL/uL (ref 3.87–5.11)
WBC: 8.3 10*3/uL (ref 4.0–10.5)

## 2012-06-22 MED ORDER — SODIUM CHLORIDE 0.9 % IV SOLN
250.0000 mL | INTRAVENOUS | Status: DC | PRN
  Administered 2012-06-23: 250 mL via INTRAVENOUS

## 2012-06-22 MED ORDER — DIGOXIN 125 MCG PO TABS
0.1250 mg | ORAL_TABLET | Freq: Every day | ORAL | Status: DC
  Administered 2012-06-23 – 2012-06-25 (×3): 0.125 mg via ORAL
  Filled 2012-06-22 (×4): qty 1

## 2012-06-22 MED ORDER — FLUTICASONE PROPIONATE 50 MCG/ACT NA SUSP: 2.0000 | Freq: Every day | NASAL | Status: DC | PRN

## 2012-06-22 MED ORDER — POLYETHYLENE GLYCOL 3350 17 G PO PACK
17.0000 g | PACK | Freq: Two times a day (BID) | ORAL | Status: DC
  Administered 2012-06-22 – 2012-06-25 (×6): 17 g via ORAL
  Filled 2012-06-22 (×8): qty 1

## 2012-06-22 MED ORDER — SODIUM CHLORIDE 0.9 % IJ SOLN
3.0000 mL | Freq: Two times a day (BID) | INTRAMUSCULAR | Status: DC
  Administered 2012-06-22 – 2012-06-25 (×6): 3 mL via INTRAVENOUS

## 2012-06-22 MED ORDER — SODIUM CHLORIDE 0.9 % IJ SOLN: 3.0000 mL | INTRAMUSCULAR | Status: DC | PRN

## 2012-06-22 MED ORDER — PHENYTOIN 50 MG PO CHEW: 300.0000 mg | CHEWABLE_TABLET | Freq: Every morning | ORAL | Status: DC

## 2012-06-22 MED ORDER — PHENOBARBITAL 100 MG PO TABS
100.0000 mg | ORAL_TABLET | Freq: Every day | ORAL | Status: DC
  Administered 2012-06-22 – 2012-06-24 (×3): 100 mg via ORAL
  Filled 2012-06-22 (×3): qty 1

## 2012-06-22 MED ORDER — SODIUM CHLORIDE 0.9 % IJ SOLN: 3.0000 mL | Freq: Two times a day (BID) | INTRAMUSCULAR | Status: DC

## 2012-06-22 MED ORDER — WARFARIN - PHARMACIST DOSING INPATIENT: Freq: Every day | Status: DC

## 2012-06-22 MED ORDER — ACETAMINOPHEN 325 MG PO TABS: 650.0000 mg | ORAL_TABLET | Freq: Four times a day (QID) | ORAL | Status: DC | PRN

## 2012-06-22 MED ORDER — ACETAMINOPHEN 650 MG RE SUPP: 650.0000 mg | Freq: Four times a day (QID) | RECTAL | Status: DC | PRN

## 2012-06-22 MED ORDER — PHENYTOIN SODIUM EXTENDED 100 MG PO CAPS
400.0000 mg | ORAL_CAPSULE | Freq: Every day | ORAL | Status: DC
  Administered 2012-06-22: 400 mg via ORAL
  Filled 2012-06-22 (×3): qty 4

## 2012-06-22 MED ORDER — PHENYTOIN SODIUM EXTENDED 100 MG PO CAPS
300.0000 mg | ORAL_CAPSULE | Freq: Every day | ORAL | Status: DC
  Administered 2012-06-23: 300 mg via ORAL
  Filled 2012-06-22: qty 3

## 2012-06-22 MED ORDER — METOPROLOL SUCCINATE ER 100 MG PO TB24
100.0000 mg | ORAL_TABLET | Freq: Every day | ORAL | Status: DC
  Administered 2012-06-23 – 2012-06-25 (×3): 100 mg via ORAL
  Filled 2012-06-22 (×3): qty 1

## 2012-06-22 MED ORDER — PHENYTOIN 50 MG PO CHEW
400.0000 mg | CHEWABLE_TABLET | Freq: Every evening | ORAL | Status: DC
  Filled 2012-06-22: qty 8

## 2012-06-22 NOTE — Progress Notes (Signed)
ANTICOAGULATION CONSULT NOTE - Initial Consult  Pharmacy Consult for Warfarin Indication: atrial fibrillation  Allergies  Allergen Reactions  . Aspirin Hives  . Penicillins Other (See Comments)    REACTION: Sweating    Patient Measurements:  Weight: 103.3 kg Height 157 cm  Vital Signs: Temp: 97.8 F (36.6 C) (07/08 1335) Temp src: Oral (07/08 1335) BP: 105/71 mmHg (07/08 1340) Pulse Rate: 69  (07/08 1335)  Labs: No results found for this basename: HGB:2,HCT:3,PLT:3,APTT:3,LABPROT:3,INR:3,HEPARINUNFRC:3,CREATININE:3,CKTOTAL:3,CKMB:3,TROPONINI:3 in the last 72 hours  The CrCl is unknown because both a height and weight (above a minimum accepted value) are required for this calculation.   Medical History: Past Medical History  Diagnosis Date  . Abnormal CT of the head 12/16/1984    R parietal atrophy  . Sleep apnea, obstructive     CPAP started  . Nonischemic cardiomyopathy     EF has normalized-repeat Pending   . Sick sinus syndrome     WITH PRIOR DDD PACEMAKER IMPLANTATION  . Hypertension   . GERD (gastroesophageal reflux disease)   . Seizure disorder   . Unspecified venous (peripheral) insufficiency   . Migraine, unspecified, without mention of intractable migraine without mention of status migrainosus   . Diaphragmatic hernia without mention of obstruction or gangrene   . Female stress incontinence   . Allergic rhinitis, cause unspecified   . Morbid obesity   . Lichenification and lichen simplex chronicus   . Atrial fibrillation   . Shortness of breath   . Pneumonia   . Pacemaker  st judes   . Heart murmur   . Stroke   . CHF (congestive heart failure)   . H/O hiatal hernia     two removed  . Seizures     two this week,   . Arthritis   . Anemia     Medications:  Prescriptions prior to admission  Medication Sig Dispense Refill  . digoxin (LANOXIN) 0.125 MG tablet Take 125 mcg by mouth daily.      . enalapril (VASOTEC) 5 MG tablet Take 2.5-5 mg by  mouth 2 (two) times daily. Take 1 tablet every morning and take one-half tablet every night at bedtime.      . fluticasone (FLONASE) 50 MCG/ACT nasal spray Place 2 sprays into the nose daily as needed. For congestion.      . furosemide (LASIX) 80 MG tablet Take 1 tablet (80 mg total) by mouth 2 (two) times daily.  60 tablet  1  . furosemide (LASIX) 80 MG tablet Take 80 mg by mouth 2 (two) times daily.      . metoprolol succinate (TOPROL-XL) 100 MG 24 hr tablet Take 100 mg by mouth 2 (two) times daily. Take with or immediately following a meal.      . omeprazole (PRILOSEC) 20 MG capsule Take 20 mg by mouth daily.      Marland Kitchen PHENobarbital (LUMINAL) 32.4 MG tablet Take 97.2 mg by mouth at bedtime.      . phenytoin (DILANTIN) 100 MG ER capsule Take 300-400 mg by mouth 2 (two) times daily. Take 300mg  every morning and take 400mg  every evening.      Marland Kitchen spironolactone (ALDACTONE) 25 MG tablet Take 25 mg by mouth daily.      Marland Kitchen warfarin (COUMADIN) 5 MG tablet Take 5 mg by mouth daily.        Assessment: 36 YOF on chronic warfarin for AFib admitted 06/22/12 s/p fall striking head and history of heart failure (EF 25-35%). Patient was recently  discharged on 06/19/12 for CHF exacerbation. INR in clinic this am was 3.2 per Dr. Marinell Blight note. Plan for Head CT to rule-out bleed s/p fall.   Goal of Therapy:  INR 2-3   Plan:  1. Hold warfarin today 2/2 to supratherapeutic INR at clinic.  2. Follow-up Head CT to rule-out bleeding before initiating warfarin.   Fayne Norrie 06/22/2012,3:19 PM

## 2012-06-22 NOTE — Progress Notes (Signed)
Inpatient progress note, [facility]MTS PGY-1 Simone Curia, MD  Subjective - Pt with no complaints who states she fell today and hit her head and her left knee because of some dizziness and having "2 left feet."  Denies confusion, LOC, CP, or SOB associated w/event.  Denies urinary symptoms.  Does not use a cane/walker at home.  Last [facility] July 5th.  Objective: PE: Gen: NAD, pleasant CV: RRR Pulm: CTAB, no increased WOB MSK: left knee with mild bruising and point TTP over the knee; forehead, cheekbones, and nasal bridge with NO TTP Neuro: Alert and Oriented x 4  Additional Assessment/Plan 61 yo w/h/o CHF recently here with overload now presenting with fall and weakness 1. Fall: CT scan and knee x-ray with no acute changes.  F/u UA. 2. PT/OT needs: consulted. Evaluate home needs to prevent future fall. Find out why pt never received CPAP machine at home. 3. Constipation - evaluate and if continued tomorrow, consider miralax.  Simone Curia 06/22/2012 10:41 PM

## 2012-06-22 NOTE — Progress Notes (Signed)
  Subjective:    Patient ID: Misty Gray, female    DOB: 1951/03/06, 61 y.o.   MRN: [id number]  HPI     Review of Systems     Objective:   Physical Exam        Assessment & Plan:  Patient seen and examined by me in [facility] with Dr Madolyn Frieze, and I agree with decision to admit to hospital.  Patient is known to me from her admission to inpatient service last week, during which time she was diuresed for her heart failure and discharged with arrangements from home health to provide home CPAP machine.  Patient and son report that she has not received the DME from home health, and in fact does not have home oxygen (uses at night, 2L/min).  Since discharge she has become increasingly dizzy with ambulation, unsteady on her feet, and fell this morning  She is on warfarin for atrial fib.  Struck her head during the fall, which occurred in the bathroom this morning. Her INR in the office today is 3.2.   She is alert but unsteady on exam; drifts off during our conversation.  Difficulty assessing her BP with manual cuff, although I can appreciate radial pulses.   Patient with increasing unsteadiness at home.  She does not appear safe to discharge to home at this point, son concurs with this concern. Patient for immediate noncontrast head CT in light of her fall this morning and INR 3.2.  Telemetry floor. To hold antihypertensives for now, orthostatics to be done on admission.  WIll need CPAP at night, and this will need to be arranged before she is discharged to home again.  Paula Compton, MD

## 2012-06-22 NOTE — Progress Notes (Signed)
Misty Gray is an 61 y.o. female.   Chief Complaint: fell today, unstable gait, dizziness/weakness HPI: This is a 61 year old female with a history of severe systolic heart failure (EF 25-30%), SSS s/p pacemaker, newly diagnosed atrial fibrillation on warfarin, seizure disorder on phenobarbital and Dilantin, and OSA on CPAP/2L O2 who presents today after a fall after voiding and who presents with dizziness and weakness.   After voiding, the patient's feet "got tangled up" and she fell around 0800-0900 today. Her left knee hit the carpet first, but she also hit her head on the carpet. She thinks she was lightheaded before she fell.  Her son brought her to [facility]. She denied lightheadedness with sitting, however, she was lightheaded when standing.   She was recently hospitalized and discharged on 06/19/2012 for CHF exacerbation. She had not taken her medications for 2 weeks after misplacing them following a move. She disuresced well, losing 10-15 pounds. At the time of discharge, she was put back on her home Lasix 80 mg bid and metoprolol XL 100 mg qd. Her enalapril and spironolactone were decreased due to hypotension. At the time of discharge, she was able to ambulate around her room without unsteadiness.   Since her discharge, her son reports that she has been wanting to "sleep all the time" and has been "wobbly".  She has been unable to get her new oxygen tank since her discharge since she needs a doctor to sign an order before the tank can be delivered. She is supposed to use oxygen with her CPAP machine at bedtime.   She is also complaining of left knee pain and a "knot" on her head today, injuries sustained after her fall.  Past Medical History  Diagnosis Date  . Abnormal CT of the head 12/16/1984    R parietal atrophy  . Sleep apnea, obstructive     CPAP started  . Nonischemic cardiomyopathy     EF has normalized-repeat Pending   . Sick sinus syndrome     WITH PRIOR DDD PACEMAKER  IMPLANTATION  . Hypertension   . GERD (gastroesophageal reflux disease)   . Seizure disorder   . Unspecified venous (peripheral) insufficiency   . Migraine, unspecified, without mention of intractable migraine without mention of status migrainosus   . Diaphragmatic hernia without mention of obstruction or gangrene   . Female stress incontinence   . Allergic rhinitis, cause unspecified   . Morbid obesity   . Lichenification and lichen simplex chronicus   . Atrial fibrillation   . Shortness of breath   . Pneumonia   . Pacemaker  st judes   . Heart murmur   . Stroke   . CHF (congestive heart failure)   . H/O hiatal hernia     two removed  . Seizures     two this week,   . Arthritis   . Anemia     Past Surgical History  Procedure Date  . Pacemaker insertion 12/16/1990    DDD EF 30%  . Ventral hernia repair 04/15/1998    Repeat  . Leg skin lesion  biopsy / excision 05/16/2001    Lichen Planus  . Pacemaker insertion 07/25/2003    replaced by BB, leads are both IS1 and from 1992, Most recent Gen Change by [facility] 05/21/11  . Insert / replace / remove pacemaker   . Hernia repair     Family History  Problem Relation Age of Onset  . Stroke Father     died from CAD?  Marland Kitchen  Cancer Mother     melanoma  . Diabetes Son     Type 1  . Sleep apnea Son   . Cancer Sister     cervical  . Hypertension Sister   . Hypertension Brother   . Rheum arthritis Daughter     Also PGM, PGGM   Social History:  reports that she has quit smoking. She has never used smokeless tobacco. She reports that she does not drink alcohol or use illicit drugs. She lives with her husband, son, and grandchildren at home.   Allergies:  Allergies  Allergen Reactions  . Aspirin     REACTION: urticaria  . Penicillins     REACTION: sweating, told she had a reaction in the hospital   ROS Denies fevers/chills Denies confusion Denies chest pain/palpitations Denies difficulty breathing Denies abdominal  pain/nausea/vomiting. She is constipated. Her last bowel movement was on 07/05, before her hospital discharge. She usually has 1-2 bowel movements a day. Denies dysuria/urgency/frequency  Temperature 97.5 F (36.4 C), temperature source Oral, SpO2 97.00%., HR 66, O2 sat 97% Physical Exam  GEN: Caucasian; NAD; morbidly obese PSYCH: appropriate to questions, engaged when spoken to, however, somnolent and closes her eyes when not actively interacting HEENT:   Head: [place], area of tenderness middle of forehead, between eyebrows; no bruising or hematoma   Eyes: normal conjunctiva   Ears: TM clear   Nose: no rhinorrhea or congestion   Throat: poor dentition; no oropharyngeal exudates or erythema NECK: no LAD CV: distant heart sounds; regular rate and rhythm; no murmurs/rubs/gallops; very faint radial pulses, worse on L PULM: NI WOB; CTAB without w/r/r; good effort and aeration ABD: NABS, obese, soft, NT EXT: bruising along shins; 0-1+ pretibial edema MSK: abrasion and tenderness along left lateral knee; no obvious effusion; ROM intact compared to right  NEURO:    CN: PERRL, EOMI with 3-4 beats of bilateral horizontal nystagmus, vision and hearing grossly intact, symmetric facies   Sensation: intact   Motor: 5/5 strength all extremities   Reflexes: 1-2+ brachioradialis and popliteal reflexes; no clonus   Coordination: slow finger-to-nose but without dysmetria   Gait: slow, very unstable, negative Romberg    Labs POCT INR: 3.1  Assessment/Plan This is a 61 year old female with a history of severe systolic heart failure (EF 25-30%), SSS s/p pacemaker, newly diagnosed atrial fibrillation on warfarin, seizure disorder on phenobarbital and Dilantin, and OSA on CPAP/2L O2 who presents today after a fall after voiding and who presents with dizziness, weakness, unsteady gait, and somnolence.   Her fall may have been from orthostatic hypotension and following her voiding. She is on a large dose of  Lasix (80 mg bid) and spironolactone 25 mg qd. Several attempts by nurses and providers were made to take her blood pressure, however, we were unsuccessful. She did report lightheadedness with ambulation and she is very unsteady on her feet, which is very different from how she presented at the time of discharge a few days ago. Her weight today is 227.8 lbs. She was 222 lbs on 07/05 at the time of hospital discharge. However, discrepancy may be due to different scales.   Her somnolence may be attributable for her not having her home oxygen. She has a history of OSA and is supposed to use CPAP with 2L [place] at home.   -Will admit to FPTS for observation and monitor on telemetry.  -Will check CT-head without contrast to rule-out bleed following her fall and hitting her head. Her INR is  also supratherapeutic today. -Will check basic labs, CBC and BMET (especially for BUN/Cr). -Will check pro-BNP.  -Will re-start home CPAP and oxygen. Her O2 saturations today at rest are fine.  -Will hold her diuretics for now. -Will check orthostatic vital signs.  -Will check daily weights and strict I/O.  -Will hold enalapril, continue metoprolol, and continue digoxin.  -Will request PT/OT consultation for tomorrow. -Will consider consulting her cardiologist Dr. Johney Frame.    MSK Left knee pain -Will check x-ray to rule-out fracture  -Tylenol prn. Icing.   CV History of severe systolic heart failure (EF25-30%) History of hypertension  History of recently diagnosed atrial fibrillation on warfarin History of SSS with pacemaker -See above   HEME/ONC Supratherapeutic INR -Will dose per pharmacy.  NEURO History of seizures -Stable on phenobarbital/Dilatin, which she has been on for a long time.  -Consider checking AM Dilatin level if patient's symptoms not improving.   GI Constipation History of GERD -Miralax bid  -Protonix  FEN/GI -IVF: SL -Diet: low salt diet  PPx -DVT PPx: on warfarin  DISPO:  pending clinical improvement  CODE: DNR/DNI   OH PARK, ANGELA 06/22/2012, 11:31 AM

## 2012-06-22 NOTE — H&P (Signed)
Hospital admission H&P Chief Complaint: fell this morning, dizziness/weakness, unstable gait HPI: This is a 60 year old female with a history of severe systolic heart failure (EF 25-30%), SSS s/p pacemaker, newly diagnosed atrial fibrillation on warfarin, seizure disorder on phenobarbital and Dilantin, and OSA on CPAP/2L O2 who presents today after a fall after voiding and who presents with dizziness and weakness.   After voiding, the patient's feet "got tangled up" and she fell around 0800-0900 today. Her left knee hit the carpet first, but she also hit her head on the carpet. She thinks she was lightheaded before she fell.  Her son brought her to [facility] She denied lightheadedness with sitting, however, she was lightheaded when standing.   She was recently hospitalized and discharged on 06/19/2012 for CHF exacerbation. She had not taken her medications for 2 weeks after misplacing them following a move. She disuresced well, losing 10-15 pounds. At the time of discharge, she was put back on her home Lasix 80 mg bid and metoprolol XL 100 mg qd. Her enalapril and spironolactone were decreased due to hypotension. At the time of discharge, she was able to ambulate around her room without unsteadiness.   Since her discharge, her son reports that she has been wanting to "sleep all the time" and has been "wobbly".  She has been unable to get her new oxygen tank since her discharge since she needs a doctor to sign an order before the tank can be delivered. She is supposed to use oxygen with her CPAP machine at bedtime.   She is also complaining of left knee pain and a "knot" on her head today, injuries sustained after her fall.  Past Medical History  Diagnosis Date  . Abnormal CT of the head 12/16/1984    R parietal atrophy  . Sleep apnea, obstructive     CPAP started  . Nonischemic cardiomyopathy     EF has normalized-repeat Pending   . Sick sinus syndrome     WITH PRIOR DDD PACEMAKER IMPLANTATION  .  Hypertension   . GERD (gastroesophageal reflux disease)   . Seizure disorder   . Unspecified venous (peripheral) insufficiency   . Migraine, unspecified, without mention of intractable migraine without mention of status migrainosus   . Diaphragmatic hernia without mention of obstruction or gangrene   . Female stress incontinence   . Allergic rhinitis, cause unspecified   . Morbid obesity   . Lichenification and lichen simplex chronicus   . Atrial fibrillation   . Shortness of breath   . Pneumonia   . Pacemaker  st judes   . Heart murmur   . Stroke   . CHF (congestive heart failure)   . H/O hiatal hernia     two removed  . Seizures     two this week,   . Arthritis   . Anemia     Past Surgical History  Procedure Date  . Pacemaker insertion 12/16/1990    DDD EF 30%  . Ventral hernia repair 04/15/1998    Repeat  . Leg skin lesion  biopsy / excision 05/16/2001    Lichen Planus  . Pacemaker insertion 07/25/2003    replaced by BB, leads are both IS1 and from 1992, Most recent Gen Change by [facility] 05/21/11  . Insert / replace / remove pacemaker   . Hernia repair     Family History  Problem Relation Age of Onset  . Stroke Father     died from CAD?  Marland Kitchen Cancer Mother  melanoma  . Diabetes Son     Type 1  . Sleep apnea Son   . Cancer Sister     cervical  . Hypertension Sister   . Hypertension Brother   . Rheum arthritis Daughter     Also PGM, PGGM   Social History:  reports that she has quit smoking. She has never used smokeless tobacco. She reports that she does not drink alcohol or use illicit drugs. She lives with her husband, son, and grandchildren at home.   Allergies:  Allergies  Allergen Reactions  . Aspirin     REACTION: urticaria  . Penicillins     REACTION: sweating, told she had a reaction in the hospital   ROS Denies fevers/chills Denies confusion Denies chest pain/palpitations Denies difficulty breathing Denies abdominal pain/nausea/vomiting. She is  constipated. Her last bowel movement was on 07/05, before her hospital discharge. She usually has 1-2 bowel movements a day. Denies dysuria/urgency/frequency  Temperature 97.5 F (36.4 C), temperature source Oral, SpO2 97.00%., HR 66, O2 sat 97% Physical Exam  GEN: Caucasian; NAD; morbidly obese PSYCH: appropriate to questions, engaged when spoken to, however, somnolent and closes her eyes when not actively interacting HEENT:   Head: [place], area of tenderness middle of forehead, between eyebrows; no bruising or hematoma   Eyes: normal conjunctiva   Ears: TM clear   Nose: no rhinorrhea or congestion   Throat: poor dentition; no oropharyngeal exudates or erythema NECK: no LAD CV: distant heart sounds; regular rate and rhythm; no murmurs/rubs/gallops; very faint radial pulses, worse on L PULM: NI WOB; CTAB without w/r/r; good effort and aeration ABD: NABS, obese, soft, NT EXT: bruising along shins; 0-1+ pretibial edema MSK: abrasion and tenderness along left lateral knee; no obvious effusion; ROM intact compared to right  NEURO:    CN: PERRL, EOMI with 3-4 beats of bilateral horizontal nystagmus, vision and hearing grossly intact, symmetric facies   Sensation: intact   Motor: 5/5 strength all extremities   Reflexes: 1-2+ brachioradialis and popliteal reflexes; no clonus   Coordination: slow finger-to-nose but without dysmetria   Gait: slow, very unstable, negative Romberg    Labs POCT INR: 3.1  Assessment/Plan This is a 61 year old female with a history of severe systolic heart failure (EF 25-30%), SSS s/p pacemaker, newly diagnosed atrial fibrillation on warfarin, seizure disorder on phenobarbital and Dilantin, and OSA on CPAP/2L O2 who presents today after a fall after voiding and who presents with dizziness, weakness, unsteady gait, and somnolence.   Her fall may have been from orthostatic hypotension and following her voiding. She is on a large dose of Lasix (80 mg bid) and  spironolactone 25 mg qd. Several attempts by nurses and providers were made to take her blood pressure, however, we were unsuccessful. She did report lightheadedness with ambulation and she is very unsteady on her feet, which is very different from how she presented at the time of discharge a few days ago. Her weight today is 227.8 lbs. She was 222 lbs on 07/05 at the time of hospital discharge. However, discrepancy may be due to different scales.   Her somnolence may be attributable for her not having her home oxygen. She has a history of OSA and is supposed to use CPAP with 2L  at home.   -Will admit to FPTS for observation and monitor on telemetry.  -Will check CT-head without contrast to rule-out bleed following her fall and hitting her head. Her INR is also supratherapeutic today. -Will check basic  labs, CBC and BMET (especially for BUN/Cr). -Will check pro-BNP.  -Will re-start home CPAP and oxygen. Her O2 saturations today at rest are fine.  -Will hold her diuretics for now. -Will check orthostatic vital signs.  -Will check daily weights and strict I/O.  -Will hold enalapril, continue metoprolol, and continue digoxin.  -Will request PT/OT consultation for tomorrow. -Will consider consulting her cardiologist Dr. Johney Frame.    CV History of severe systolic heart failure (EF25-30%) History of hypertension  History of recently diagnosed atrial fibrillation on warfarin History of SSS with pacemaker -See above   HEME/ONC Supratherapeutic INR -Will dose per pharmacy.  NEURO History of seizures -Stable on phenobarbital/Dilatin, which she has been on for a long time.  -Consider checking AM Dilatin level if patient's symptoms not improving.   GI History of GERD -Protonix  FEN/GI -IVF: SL -Diet: low salt diet  PPx -DVT PPx: on warfarin  DISPO: pending clinical improvement  CODE: DNR/DNI   OH PARK, ANGELA 06/22/2012, 11:31 AM

## 2012-06-23 ENCOUNTER — Telehealth: Payer: Self-pay | Admitting: Family Medicine

## 2012-06-23 DIAGNOSIS — R269 Unspecified abnormalities of gait and mobility: Secondary | ICD-10-CM

## 2012-06-23 DIAGNOSIS — R5381 Other malaise: Secondary | ICD-10-CM

## 2012-06-23 DIAGNOSIS — I959 Hypotension, unspecified: Secondary | ICD-10-CM

## 2012-06-23 DIAGNOSIS — R569 Unspecified convulsions: Secondary | ICD-10-CM

## 2012-06-23 LAB — BASIC METABOLIC PANEL
BUN: 49 mg/dL — ABNORMAL HIGH (ref 6–23)
GFR calc Af Amer: 81 mL/min — ABNORMAL LOW (ref >90)
GFR calc non Af Amer: 70 mL/min — ABNORMAL LOW (ref >90)
Potassium: 4.8 mEq/L (ref 3.5–5.1)

## 2012-06-23 LAB — PHENYTOIN LEVEL, TOTAL: Phenytoin Lvl: 36.1 ug/mL (ref 10.0–20.0)

## 2012-06-23 LAB — POCT INR: INR: 3.2

## 2012-06-23 MED ORDER — BIOTENE DRY MOUTH MT LIQD
15.0000 mL | Freq: Two times a day (BID) | OROMUCOSAL | Status: DC
  Administered 2012-06-23 – 2012-06-24 (×4): 15 mL via OROMUCOSAL

## 2012-06-23 MED ORDER — ONDANSETRON HCL 4 MG/2ML IJ SOLN
4.0000 mg | Freq: Four times a day (QID) | INTRAMUSCULAR | Status: DC | PRN
  Administered 2012-06-23: 4 mg via INTRAVENOUS

## 2012-06-23 MED ORDER — WARFARIN SODIUM 7.5 MG PO TABS
7.5000 mg | ORAL_TABLET | Freq: Once | ORAL | Status: DC
  Filled 2012-06-23: qty 1

## 2012-06-23 MED ORDER — ONDANSETRON HCL 4 MG/2ML IJ SOLN
INTRAMUSCULAR | Status: AC
  Filled 2012-06-23: qty 2

## 2012-06-23 MED ORDER — PHENYTOIN SODIUM EXTENDED 100 MG PO CAPS: 200.0000 mg | ORAL_CAPSULE | Freq: Every day | ORAL | Status: DC

## 2012-06-23 MED ORDER — RIVAROXABAN 20 MG PO TABS
20.0000 mg | ORAL_TABLET | Freq: Every day | ORAL | Status: DC
  Administered 2012-06-24: 20 mg via ORAL
  Filled 2012-06-23 (×2): qty 1

## 2012-06-23 NOTE — Progress Notes (Signed)
This visit was in response to a spiritual care consult.  Pt was sleeping.  I spoke with pt's nurse then entered the pt's room and said her name.  She continued to sleep soundly.  I will attempt another visit later.  Please page me if further assistance is needed. Dellie Catholic  [phone] personal pager

## 2012-06-23 NOTE — Progress Notes (Signed)
Physical Therapy Evaluation Patient Details Name: Misty Gray MRN: [id number] DOB: Feb 25, 1951 Today's Date: 06/23/2012 Time: 0950-1027 PT Time Calculation (min): 37 min  PT Assessment / Plan / Recommendation Clinical Impression  61 yo female admitted with fall, noted recent admission for CHF, family reporting since then pt has been quite wobbly on her feet and somnolent -- they report this is different from baseline;   Pt presents with decr functional mobility and significantly incr fall risk;   Will benefit from acute PT to maximize independnce and safety with mobility, facilitate dc planning    PT Assessment  Patient needs continued PT services    Follow Up Recommendations  Inpatient Rehab    Barriers to Discharge        Equipment Recommendations  Rolling walker with 5" wheels    Recommendations for Other Services Rehab consult   Frequency Min 3X/week    Precautions / Restrictions Precautions Precautions: Fall Precaution Comments: Heavy R lean   Pertinent Vitals/Pain no apparent distress Orthostatic BPs negative, and pt did not report dizziness; RN notified of BPs      Mobility  Bed Mobility Bed Mobility: Supine to Sit Supine to Sit: 5: Supervision Details for Bed Mobility Assistance: Increased time and effort, used rail Transfers Transfers: Sit to Stand;Stand to Sit Sit to Stand: 3: Mod assist;From bed;With upper extremity assist Stand to Sit: 3: Mod assist;With upper extremity assist;To chair/3-in-1 Details for Transfer Assistance: Cues for safety, hand placement; posterior lean with tendency to brace backs of legs against bed for stability; Physical assist to bring weight forward over forefeet Ambulation/Gait Ambulation/Gait Assistance: 3: Mod assist;2: Max assist;1: +1 Total assist (Total assist to prevent falls to right initially) Ambulation Distance (Feet): 22 Feet Assistive device: Rolling walker Ambulation/Gait Assistance Details: cues for using RW,  techinque and proximity of RW as RW tends to lift off ground as pt loses balance; heavy Right lean, decr control of weight shift into Right stance, and pt tends to "overshoot" with Right lean continuing into significant loss of balance; this occurred multiple time during walk; cues to lean left "touch your shoulder to mine"  Gait Pattern: Decreased step length - right;Decreased step length - left;Decreased stance time - left Gait velocity: significantly decr Modified Rankin (Stroke Patients Only) Pre-Morbid Rankin Score: Moderate disability Modified Rankin: Severe disability    Exercises     PT Diagnosis: Difficulty walking;Abnormality of gait  PT Problem List: Decreased activity tolerance;Decreased balance;Decreased mobility;Decreased coordination;Decreased cognition;Decreased knowledge of use of DME;Decreased safety awareness;Decreased knowledge of precautions PT Treatment Interventions: DME instruction;Gait training;Stair training;Functional mobility training;Therapeutic activities;Therapeutic exercise;Balance training;Neuromuscular re-education;Cognitive remediation;Patient/family education   PT Goals Acute Rehab PT Goals PT Goal Formulation: With patient Time For Goal Achievement: 06/23/12 Potential to Achieve Goals: Good Pt will go Supine/Side to Sit: with modified independence PT Goal: Supine/Side to Sit - Progress: Goal set today Pt will go Sit to Supine/Side: with modified independence PT Goal: Sit to Supine/Side - Progress: Goal set today Pt will go Sit to Stand: with supervision PT Goal: Sit to Stand - Progress: Goal set today Pt will go Stand to Sit: with supervision PT Goal: Stand to Sit - Progress: Goal set today Pt will Stand: with supervision;6 - 10 min;with unilateral upper extremity support PT Goal: Stand - Progress: Goal set today Pt will Ambulate: >150 feet;with supervision;with least restrictive assistive device;with rolling walker PT Goal: Ambulate - Progress: Goal  set today Pt will Go Up / Down Stairs: 3-5 stairs;with rail(s);with min assist PT  Goal: Up/Down Stairs - Progress: Goal set today  Visit Information  Last PT Received On: 06/23/12 Assistance Needed: +2 (helpful for safety)    Subjective Data  Subjective: Agreeable to getting up; Reports feels "frunk" when on her feet Patient Stated Goal: Be able to get up and around ok   Prior Functioning  Home Living Lives With: Spouse;Family Available Help at Discharge: Family Type of Home: House Home Access: Stairs to enter [employer] of Steps: 3 (Pt describes rounded steps) Entrance Stairs-Rails: Right Home Layout: One level Bathroom Shower/Tub: Engineer, manufacturing systems: Standard Bathroom Accessibility: Yes How Accessible: Accessible via walker Home Adaptive Equipment: Bedside commode/3-in-1;Reacher;Tub transfer bench Additional Comments: rental home Prior Function Level of Independence: Independent Able to Take Stairs?: Yes Driving: No Comments: family does cooking Communication Communication: No difficulties Dominant Hand: Right    Cognition  Overall Cognitive Status: Impaired Area of Impairment: Attention;Safety/judgement;Executive functioning;Problem solving;Awareness of errors Arousal/Alertness: Awake/alert Orientation Level: Oriented X4 / Intact Behavior During Session: [facility] for tasks performed Current Attention Level: Sustained Attention - Other Comments: easily distracted Safety/Judgement: Decreased awareness of safety precautions;Decreased safety judgement for tasks assessed;Decreased awareness of need for assistance Awareness of Errors: Assistance required to identify errors made Problem Solving: delayed processing Executive Functioning: decreased self monitoring, attention, higher level problem solving    Extremity/Trunk Assessment Right Upper Extremity Assessment RUE ROM/Strength/Tone: WFL for tasks assessed RUE Sensation: WFL - Light Touch RUE  Coordination: WFL - gross/fine motor Left Upper Extremity Assessment LUE ROM/Strength/Tone: Within functional levels LUE Sensation: WFL - Light Touch;WFL - Proprioception LUE Coordination: WFL - gross/fine motor Right Lower Extremity Assessment RLE ROM/Strength/Tone: WFL for tasks assessed Left Lower Extremity Assessment LLE ROM/Strength/Tone: WFL for tasks assessed Trunk Assessment Trunk Assessment: Kyphotic;Other exceptions (forward head)   Balance Balance Balance Assessed:  (rec BERG)  End of Session PT - End of Session Equipment Utilized During Treatment: Gait belt Activity Tolerance: Patient tolerated treatment well Patient left: in chair;with call bell/phone within reach Nurse Communication: Mobility status  GP Functional Assessment Tool Used: clinical judgement Functional Limitation: Mobility: Walking and moving around Mobility: Walking and Moving Around Current Status (Q4696): At least 60 percent but less than 80 percent impaired, limited or restricted Mobility: Walking and Moving Around Goal Status [phone]): At least 1 percent but less than 20 percent impaired, limited or restricted   Van Clines [facility] [place], [place] [phone]  06/23/2012, 12:18 PM

## 2012-06-23 NOTE — Progress Notes (Signed)
Family Medicine Teaching Service Daily Progress Note Intern Pager: [phone]  [name] Medical record number: [id number] Date of birth: Jul 28, 1951 Age: 61 y.o. Gender: female  Primary Care Provider: Tobin Chad, MD  Subjective: Pt seen at beside this morning. States she feels well and slept well overnight. Denies dizziness "unless I stand up to fast." Pt also states, "I still feel wobbly when I walk around." Also denies chest pain, SOB.  Objective: Temp:  [97.4 F (36.3 C)-98.5 F (36.9 C)] 98.5 F (36.9 C) (07/09 0933) Pulse Rate:  [59-84] 68  (07/09 0933) Resp:  [19-21] 21  (07/09 0933) BP: (82-109)/(48-73) 107/62 mmHg (07/09 0933) SpO2:  [91 %-100 %] 100 % (07/09 0933) Weight:  [224 lb 8 oz (101.833 kg)] 224 lb 8 oz (101.833 kg) (07/08 1821) Exam: General: sitting on side of bed, reading Bible, eating breakfast, alert and appropriate in conversation Cardiovascular: RRR Respiratory: no increased work of breathing, good air movement bilaterally Abdomen: BS+, soft, nontender/nondistended Extremities: distal pulses intact in bilateral lower extremities  Laboratory:  Lab 06/22/12 1524 06/19/12 0515 06/18/12 0648  WBC 8.3 7.9 7.6  HGB 14.2 14.8 14.3  HCT 42.2 44.7 43.0  PLT 147* 142* 153    Lab 06/23/12 0550 06/22/12 1524 06/18/12 0648  NA 132* 131* 133*  K 4.8 4.7 5.1  CL 95* 93* 93*  CO2 26 27 31   BUN 49* 56* 31*  CREATININE 0.87 1.09 0.93  LABGLOM -- -- --  GLUCOSE 102* -- --  CALCIUM 9.1 9.3 9.6    Lab 06/23/12 0550 06/19/12 0515 06/18/12 0648  INR 2.35* 2.64* 2.60*   Phenytoin level: PENDING Phenobarbital level: PENDING  UA: to be collected  Imaging/Diagnostic Tests: CXR, 7/8 IMPRESSION:  Resolution of pleural effusions and interstitial edema. The  patient has persistent cardiomegaly and pulmonary vascular  Congestion.  Knee 4 view, 7/8 IMPRESSION:  No acute abnormalities.  CT Head w/o contrast, 7/8 IMPRESSION:  1. No acute  intracranial abnormality.  2. Chronic right hemisphere infarcts.   Assessment and Plan: Cristin Penaflor is a 61yo female with history of CHF (EF 20-25%), HTN, afib, and history of seizure disorder who was recently discharged from our service, now here with fall, weakness, somnolence. Admitted 7/8.   1. Fall - Possibly due to orthostasis given recent admission and aggressive diuresis at that time for CHF exacerbation. Pt with history of seizure disorder; see below. CT scan without acute intracranial process; stroke unlikely. Knee x-ray normal.  PLAN - Will hold off on any further stroke work-up for now; may recommend lipid panel as outpt for follow-up as the last check was >7 months ago. PT/OT assessment for home health needs. Will continue to monitor clinically.  2. Seizure disorder - Per pt history; uncertain specific diagnosis and neurology follow-up. Pt on Dilantin and Phenobarb. These may have contributed to any somnolence/fall. PLAN - Will check with pt about follow-up/neurology visits in recent past. Will also check Dilantin and phenobarb levels.  3. CHF - Pt with recent admission requiring aggressive diuresis. BNP 1890 on admission. No current gross fluid overload. PLAN - Monitor for fluid overload. Continue home digoxin. Low sodium diet.  3. A-fib - Pt on metoprolol, coumadin; INR supratherapeutic in clinic on 7/8 (3.2) Now 2.35. PLAN - Coumadin dosing per pharmacy; held on 7/8. Continue and monitor INRs.  4. OSA - Pt on CPAP at home; machine is not working correctly and was taken by family to [facility] and were told it is not  repairable. Case management contacted for setup of new CPAP machine with 2L O2 at night, only.  PPx: No VTE prophylaxis; pt on Coumadin therapy for a-fib.  Dispo: home pending further workup and home health accomodations  Code Status: DNR  Bobbye Morton, MD PGY-1, [facility] Family Medicine FPTS Intern pager: [phone]

## 2012-06-23 NOTE — Progress Notes (Signed)
Family Medicine Teaching Service Attending Note  I interviewed and examined patient Misty Gray and reviewed their tests and x-rays.  I discussed with Dr. Casper Harrison and reviewed their note for today.  I agree with their assessment and plan.     Additionally  Able to stand but wobbly and has to hold for balance.  No evident focal weakness Phys Theapy eval  Check to see if can change antsz medications now is on warfarin

## 2012-06-23 NOTE — Progress Notes (Signed)
MEDICATION RELATED CONSULT NOTE    Pharmacy Consult for Phenytoin Indication:  Supra-therapeutic Level  Allergies  Allergen Reactions  . Aspirin Hives  . Penicillins Rash and Other (See Comments)    "doesn't do the job it's suppose to do; I get real hyperactive and I break out in a rash"   Patient Measurements: Height: 5\' 3"  (160 cm) Weight: 224 lb 8 oz (101.833 kg) IBW/kg (Calculated) : 52.4   Labs:   Ref. Range 06/23/2012 12:48  Phenytoin Lvl Latest Range: 10.0-20.0 ug/mL 36.1 (HH)   Estimated Creatinine Clearance: 76.4 ml/min (by C-G formula based on Cr of 0.87).   Medications:  Scheduled:    . PHENObarbital  100 mg Oral QHS  . DISCONTD: phenytoin  300 mg Oral Daily  . DISCONTD: phenytoin  400 mg Oral QHS   Assessment: 61yo female admitted with dizziness and weakness with gait problems.  She has a history of seizures and is receiving Dilantin and Phenobarb maintenance meds.  Her home dose has been Phenobarbital 100mg  daily and Dilantin 700mg  daily with 300mg  in the morning and 400mg  in the evening.  Her level today is 36.7mcg/ml which is above desired goal range of 10-20 mcg/ml.  Dilantin has a fairly long half life and will take holding a few doses to see her level drop to within goal range.  Goal of Therapy:  Dilantin level = 10-20 mcg/ml  Plan:   Will hold tonight's dose and tomorrow's doses.    Check level on 7/11 and resume if it is < 20.  Her dose may need to be changed to ~ 400mg  daily.  Nadara Mustard, PharmD., MS Clinical Pharmacist Pager:  [phone] Thank you for allowing pharmacy to be part of this patients care team. 06/23/2012,3:29 PM

## 2012-06-23 NOTE — Consult Note (Signed)
Physical Medicine and Rehabilitation Consult Reason for Consult: Weakness Referring Physician: Dr. Deirdre Priest   HPI: Misty Gray is a 61 y.o. right-handed female with history of nonischemic cardiomyopathy with sick sinus syndrome status post pacemaker and newly diagnosed atrial fibrillation on Coumadin therapy. She also has a history of seizure disorder maintained on Dilantin and phenobarbital. Patient recently hospitalized and discharged 06/19/2012 for CHF exacerbation. She was discharged ambulating without the use of an assistive device. Patient was readmitted 7/8/ 13 with lethargy and wanting to sleep all the time per her son. There was report of a fall and that she struck her head and left knee. Cranial CT scan showed no acute intracranial abnormality. Chest x-ray showed resolution of pleural effusions and interstitial edema. Phenobarbital level 17.2. She had a mildly elevated BUN of 56 with normal creatinine 1.09. A Dilantin level was pending. Full workup is ongoing. Patient remains on chronic Coumadin with latest INR 2.35. Physical and occupational therapy evaluations completed with recommendations of physical medicine rehabilitation consult to consider inpatient rehabilitation services.   Review of Systems  Constitutional: Positive for malaise/fatigue.  Cardiovascular: Positive for palpitations and leg swelling.  Neurological: Positive for seizures and weakness.  All other systems reviewed and are negative.   Past Medical History  Diagnosis Date  . Abnormal CT of the head 12/16/1984    R parietal atrophy  . Nonischemic cardiomyopathy     EF has normalized-repeat Pending   . Hypertension   . GERD (gastroesophageal reflux disease)   . Unspecified venous (peripheral) insufficiency   . Diaphragmatic hernia without mention of obstruction or gangrene   . Female stress incontinence   . Allergic rhinitis, cause unspecified   . Morbid obesity   . Lichenification and lichen simplex chronicus    . Pacemaker  st judes   . Heart murmur   . CHF (congestive heart failure)   . H/O hiatal hernia     two removed  . Anemia   . High cholesterol   . Pneumonia 2004; 2011; 11/2011  . Sleep apnea, obstructive     CPAP  . Exertional dyspnea 06/22/12  . Migraine, unspecified, without mention of intractable migraine without mention of status migrainosus     "when I was a teenager"  . Seizures     "can hear you talking but sounds like you are in big tunnel; have them often if not taking RX; last one was 06/17/12" (06/22/12)  . Stroke 1988    "mouth drawed real bad on my left side"  . Sick sinus syndrome     WITH PRIOR DDD PACEMAKER IMPLANTATION  . Atrial fibrillation   . Syncope and collapse 06/22/12    "hit forehead and left knee"; denies loss of consciousness  . Arthritis     "hands and knees"   Past Surgical History  Procedure Date  . Leg skin lesion  biopsy / excision 05/16/2001    Lichen Planus  . Hernia repair ? 1988; 04/15/1998    ventral  . Insert / replace / remove pacemaker 12/16/1990    DDD EF 30%;  Marland Kitchen Insert / replace / remove pacemaker 07/25/2003    replaced by BB, leads are both IS1 and from 1992  . Insert / replace / remove pacemaker 05/21/2011    JA; generator change   Family History  Problem Relation Age of Onset  . Stroke Father     died from CAD?  Marland Kitchen Cancer Mother     melanoma  . Diabetes Son  Type 1  . Sleep apnea Son   . Cancer Sister     cervical  . Hypertension Sister   . Hypertension Brother   . Rheum arthritis Daughter     Also PGM, PGGM   Social History:  reports that she quit smoking about 43 years ago. Her smoking use included Cigarettes. She smoked 1 pack per day. She has never used smokeless tobacco. She reports that she does not drink alcohol or use illicit drugs. Allergies:  Allergies  Allergen Reactions  . Aspirin Hives  . Penicillins Rash and Other (See Comments)    "doesn't do the job it's suppose to do; I get real hyperactive and I break out in  a rash"   Medications Prior to Admission  Medication Sig Dispense Refill  . digoxin (LANOXIN) 0.125 MG tablet Take 125 mcg by mouth daily.      . enalapril (VASOTEC) 5 MG tablet Take 2.5-5 mg by mouth 2 (two) times daily. Take 1 tablet every morning and take one-half tablet every night at bedtime.      . fluticasone (FLONASE) 50 MCG/ACT nasal spray Place 2 sprays into the nose daily as needed. For congestion.      . furosemide (LASIX) 80 MG tablet Take 1 tablet (80 mg total) by mouth 2 (two) times daily.  60 tablet  1  . furosemide (LASIX) 80 MG tablet Take 80 mg by mouth 2 (two) times daily.      . metoprolol succinate (TOPROL-XL) 100 MG 24 hr tablet Take 100 mg by mouth 2 (two) times daily. Take with or immediately following a meal.      . omeprazole (PRILOSEC) 20 MG capsule Take 20 mg by mouth daily.      Marland Kitchen PHENobarbital (LUMINAL) 32.4 MG tablet Take 97.2 mg by mouth at bedtime.      . phenytoin (DILANTIN) 100 MG ER capsule Take 300-400 mg by mouth 2 (two) times daily. Take 300mg  every morning and take 400mg  every evening.      Marland Kitchen spironolactone (ALDACTONE) 25 MG tablet Take 25 mg by mouth daily.      Marland Kitchen warfarin (COUMADIN) 5 MG tablet Take 5 mg by mouth daily.        Home: Home Living Lives With: Spouse;Family Available Help at Discharge: Family Type of Home: House Home Access: Stairs to enter [employer] of Steps: 3 (Pt describes rounded steps) Entrance Stairs-Rails: Right Home Layout: One level Bathroom Shower/Tub: Engineer, manufacturing systems: Standard Bathroom Accessibility: Yes How Accessible: Accessible via walker Home Adaptive Equipment: Bedside commode/3-in-1;Reacher;Tub transfer bench Additional Comments: rental home  Functional History: Prior Function Able to Take Stairs?: Yes Driving: No Comments: family does cooking Functional Status:  Mobility: Bed Mobility Bed Mobility: Supine to Sit Supine to Sit: 5: Supervision Transfers Transfers: Sit to  Stand;Stand to Sit Sit to Stand: 3: Mod assist;From bed;With upper extremity assist Stand to Sit: 3: Mod assist;With upper extremity assist;To chair/3-in-1 Ambulation/Gait Ambulation/Gait Assistance: 3: Mod assist;2: Max assist;1: +1 Total assist (Total assist to prevent falls to right initially) Ambulation Distance (Feet): 22 Feet Assistive device: Rolling walker Ambulation/Gait Assistance Details: cues for using RW, techinque and proximity of RW as RW tends to lift off ground as pt loses balance; heavy Right lean, decr control of weight shift into Right stance, and pt tends to "overshoot" with Right lean continuing into significant loss of balance; this occurred multiple time during walk; cues to lean left "touch your shoulder to mine"  Gait Pattern: Decreased  step length - right;Decreased step length - left;Decreased stance time - left Gait velocity: significantly decr    ADL: ADL Eating/Feeding: Simulated;Independent Where Assessed - Eating/Feeding: Chair Grooming: Performed;Min guard Where Assessed - Grooming: Supported standing Upper Body Bathing: Simulated;Set up Where Assessed - Upper Body Bathing: Supported sitting Lower Body Bathing: Simulated;Maximal assistance Where Assessed - Lower Body Bathing: Supported sit to stand Upper Body Dressing: Simulated;Minimal assistance Where Assessed - Upper Body Dressing: Supported sitting Lower Body Dressing: Simulated;Maximal assistance Where Assessed - Lower Body Dressing: Supported sit to Pharmacist, hospital: Simulated;Moderate assistance Toilet Transfer Method: Sit to stand Transfers/Ambulation Related to ADLs: Transfers with modA. Leaning to R. Ambulating with RW - manual A to facilitate wt shift to L. R preference during all mobility  Cognition: Cognition Arousal/Alertness: Awake/alert Orientation Level: Oriented X4 Cognition Overall Cognitive Status: Impaired Area of Impairment: Attention;Safety/judgement;Executive  functioning;Problem solving;Awareness of errors Arousal/Alertness: Awake/alert Orientation Level: Oriented X4 / Intact Behavior During Session: [facility] for tasks performed Current Attention Level: Sustained Attention - Other Comments: easily distracted Safety/Judgement: Decreased awareness of safety precautions;Decreased safety judgement for tasks assessed;Decreased awareness of need for assistance Awareness of Errors: Assistance required to identify errors made Problem Solving: delayed processing Executive Functioning: decreased self monitoring, attention, higher level problem solving  Blood pressure 94/62, pulse 71, temperature 98.6 F (37 C), temperature source Oral, resp. rate 19, height 5\' 3"  (1.6 m), weight 101.833 kg (224 lb 8 oz), SpO2 96.00%. Physical Exam  Constitutional:       Patient appears older than stated age.  HENT:  Head: Normocephalic.       Poor dentition.  Neck: Neck supple. No thyromegaly present.  Cardiovascular:       Cardiac rate controlled.  Pulmonary/Chest: Breath sounds normal. She has no wheezes.  Abdominal: Bowel sounds are normal. She exhibits no distension. There is no tenderness.  Neurological:       Patient was initially difficult to arouse. She made good eye contact with examiner. She followed basic commands. Appropriate to naming age, place and situation. 6 beats of nystagmus to right and left. Decreased FMC bilaterally. Pleasant when awake.  Skin: Skin is warm and dry.  Psychiatric: She has a normal mood and affect.    Results for orders placed during the hospital encounter of 06/22/12 (from the past 24 hour(s))  BASIC METABOLIC PANEL     Status: Abnormal   Collection Time   06/22/12  3:24 PM      Component Value Range   Sodium 131 (*) 135 - 145 mEq/L   Potassium 4.7  3.5 - 5.1 mEq/L   Chloride 93 (*) 96 - 112 mEq/L   CO2 27  19 - 32 mEq/L   Glucose, Bld 108 (*) 70 - 99 mg/dL   BUN 56 (*) 6 - 23 mg/dL   Creatinine, Ser 1.61  0.50 - 1.10 mg/dL    Calcium 9.3  8.4 - 09.6 mg/dL   GFR calc non Af Amer 53 (*) >90 mL/min   GFR calc Af Amer 62 (*) >90 mL/min  CBC     Status: Abnormal   Collection Time   06/22/12  3:24 PM      Component Value Range   WBC 8.3  4.0 - 10.5 K/uL   RBC 4.75  3.87 - 5.11 MIL/uL   Hemoglobin 14.2  12.0 - 15.0 g/dL   HCT 04.5  40.9 - 81.1 %   MCV 88.8  78.0 - 100.0 fL   MCH 29.9  26.0 -  34.0 pg   MCHC 33.6  30.0 - 36.0 g/dL   RDW 16.1  09.6 - 04.5 %   Platelets 147 (*) 150 - 400 K/uL  PRO B NATRIURETIC PEPTIDE     Status: Abnormal   Collection Time   06/22/12  3:24 PM      Component Value Range   Pro B Natriuretic peptide (BNP) 1890.0 (*) 0 - 125 pg/mL  BASIC METABOLIC PANEL     Status: Abnormal   Collection Time   06/23/12  5:50 AM      Component Value Range   Sodium 132 (*) 135 - 145 mEq/L   Potassium 4.8  3.5 - 5.1 mEq/L   Chloride 95 (*) 96 - 112 mEq/L   CO2 26  19 - 32 mEq/L   Glucose, Bld 102 (*) 70 - 99 mg/dL   BUN 49 (*) 6 - 23 mg/dL   Creatinine, Ser 4.09  0.50 - 1.10 mg/dL   Calcium 9.1  8.4 - 81.1 mg/dL   GFR calc non Af Amer 70 (*) >90 mL/min   GFR calc Af Amer 81 (*) >90 mL/min  PROTIME-INR     Status: Abnormal   Collection Time   06/23/12  5:50 AM      Component Value Range   Prothrombin Time 26.1 (*) 11.6 - 15.2 seconds   INR 2.35 (*) 0.00 - 1.49  PHENYTOIN LEVEL, TOTAL     Status: Abnormal   Collection Time   06/23/12 12:48 PM      Component Value Range   Phenytoin Lvl 36.1 (*) 10.0 - 20.0 ug/mL  PHENOBARBITAL LEVEL     Status: Normal   Collection Time   06/23/12 12:48 PM      Component Value Range   Phenobarbital 17.2  15.0 - 40.0 ug/mL   X-ray Chest Pa And Lateral   06/22/2012  *RADIOLOGY REPORT*  Clinical Data: Congestive heart failure.  Recent fall.  CHEST - 2 VIEW  Comparison: 06/12/2012  Findings: The patient has persistent cardiomegaly with slight pulmonary vascular congestion without frank pulmonary edema.  No effusions.  No acute osseous abnormality.  Dual lead pacer is in  place.  IMPRESSION: Resolution of pleural effusions and interstitial edema.  The patient has persistent cardiomegaly and pulmonary vascular congestion.  Original Report Authenticated By: Gwynn Burly, M.D.   Ct Head Wo Contrast  06/22/2012  *RADIOLOGY REPORT*  Clinical Data: 61 year old female status post fall with head injury.  INR supra therapeutic.  CT HEAD WITHOUT CONTRAST  Technique:  Contiguous axial images were obtained from the base of the skull through the vertex without contrast.  Comparison: None.  Findings: Visualized paranasal sinuses and mastoids are clear. Visualized orbits and scalp soft tissues are within normal limits. No acute osseous abnormality identified.  Calcified atherosclerosis at the skull base.  Chronic the right MCA anterior division infarct with encephalomalacia and mild ex vacuo enlargement of the right lateral ventricle.  A small chronic cortically based infarct also in the medial right parietal lobe.  No midline shift, mass effect, or evidence of mass lesion.  No acute intracranial hemorrhage identified.  No evidence of cortically based acute infarction identified.  No ventriculomegaly. No suspicious intracranial vascular hyperdensity.  IMPRESSION: 1. No acute intracranial abnormality. 2.  Chronic right hemisphere infarcts.  Original Report Authenticated By: Harley Hallmark, M.D.   Dg Knee 4 Views W/patella Left  06/22/2012  *RADIOLOGY REPORT*  Clinical Data: Anterior knee pain secondary to a fall this morning. Swelling.  Limited range of motion.  Weakness.  LEFT KNEE - COMPLETE 4+ VIEW  Comparison: None.  Findings: There is no fracture or dislocation.  Mild degenerative changes of the medial and patellofemoral compartments.  No joint effusion.  IMPRESSION: No acute abnormalities.  Original Report Authenticated By: Gwynn Burly, M.D.    Assessment/Plan: Diagnosis: Dilantin Toxicity 1. Does the need for close, 24 hr/day medical supervision in concert with the patient's  rehab needs make it unreasonable for this patient to be served in a less intensive setting? No 2. Co-Morbidities requiring supervision/potential complications: chf, obesity 3. Due to bladder management, bowel management, safety, skin/wound care, disease management, medication administration, pain management and patient education, does the patient require 24 hr/day rehab nursing? No 4. Does the patient require coordinated care of a physician, rehab nurse, PT and OT to address physical and functional deficits in the context of the above medical diagnosis(es)? No Addressing deficits in the following areas: balance, endurance, locomotion, strength, transferring, bowel/bladder control and feeding 5. Can the patient actively participate in an intensive therapy program of at least 3 hrs of therapy per day at least 5 days per week? Potentially 6. The potential for patient to make measurable gains while on inpatient rehab is poor 7. Anticipated functional outcomes upon discharge from inpatient rehab are not app. 8. Estimated rehab length of stay to reach the above functional goals is: not app 9. Does the patient have adequate social supports to accommodate these discharge functional goals? Potentially 10. Anticipated D/C setting: Home 11. Anticipated post D/C treatments: HH therapy 12. Overall Rehab/Functional Prognosis: excellent  RECOMMENDATIONS: This patient's condition is appropriate for continued rehabilitative care in the following setting: [facility] Patient has agreed to participate in recommended program. Potentially Note that insurance prior authorization may be required for reimbursement for recommended care.  Comment: I would expect, as the dilantin toxicity resolves that her arousal, balance and other symptoms should continue to improve   Ivory Broad, MD 06/23/2012

## 2012-06-23 NOTE — Progress Notes (Signed)
Occupational Therapy Evaluation Patient Details Name: Misty Gray MRN: [id number] DOB: 07/22/1951 Today's Date: 06/23/2012 Time: 1045-     OT Assessment / Plan / Recommendation Clinical Impression  60yo admitted due to recent mechanical fall in [place]. Pt states she hit her head. Son states that pt has "not been the same since her last hospital admission and has been very wobbly and very sleepy". Pt will benefit from skilled Ot services secondary to deficits listed belw to max independence with ADL and functional mobilty for ADl to facilitate D/C to next venue of care. Pt unsafe to D/C home at this time due to high fall risk and apparent cognitive deficits. Pt may benefit from CIR to return to PLOF.    OT Assessment  Patient needs continued OT Services    Follow Up Recommendations  Inpatient Rehab    Barriers to Discharge None    Equipment Recommendations  None recommended by OT    Recommendations for Other Services Rehab consult  Frequency  Min 2X/week    Precautions / Restrictions Precautions Precautions: Fall   Pertinent Vitals/Pain BP sitting 100/65; standing 119/85. No c/o dizziness    ADL  Eating/Feeding: Simulated;Independent Where Assessed - Eating/Feeding: Chair Grooming: Performed;Min guard Where Assessed - Grooming: Supported standing Upper Body Bathing: Simulated;Set up Where Assessed - Upper Body Bathing: Supported sitting Lower Body Bathing: Simulated;Maximal assistance Where Assessed - Lower Body Bathing: Supported sit to stand Upper Body Dressing: Simulated;Minimal assistance Where Assessed - Upper Body Dressing: Supported sitting Lower Body Dressing: Simulated;Maximal assistance Where Assessed - Lower Body Dressing: Supported sit to Pharmacist, hospital: Simulated;Moderate assistance Toilet Transfer Method: Sit to stand Toileting - Clothing Manipulation and Hygiene: Simulated;Moderate assistance Transfers/Ambulation Related to ADLs: Transfers with modA.  Leaning to R. Ambulating with RW - manual A to facilitate wt shift to L. R preference during all mobility    OT Diagnosis: Generalized weakness;Cognitive deficits;Disturbance of vision  OT Problem List: Decreased strength;Decreased activity tolerance;Impaired balance (sitting and/or standing);Impaired vision/perception;Decreased cognition;Decreased safety awareness;Decreased knowledge of use of DME or AE;Obesity OT Treatment Interventions: Self-care/ADL training;Therapeutic exercise;Neuromuscular education;Energy conservation;DME and/or AE instruction;Therapeutic activities;Cognitive remediation/compensation;Visual/perceptual remediation/compensation;Patient/family education;Balance training   OT Goals Acute Rehab OT Goals OT Goal Formulation: With patient Time For Goal Achievement: 07/07/12 Potential to Achieve Goals: Good ADL Goals Pt Will Perform Grooming: Standing at sink;with supervision;Unsupported;with cueing (comment type and amount) ADL Goal: Grooming - Progress: Goal set today Pt Will Perform Lower Body Bathing: with supervision;with caregiver independent in assisting;Unsupported;with cueing (comment type and amount);Standing at sink;Sitting at sink ADL Goal: Lower Body Bathing - Progress: Goal set today Pt Will Perform Lower Body Dressing: with supervision;with caregiver independent in assisting;Unsupported;with cueing (comment type and amount);Sit to stand from chair ADL Goal: Lower Body Dressing - Progress: Goal set today Pt Will Transfer to Toilet: Stand pivot transfer;Ambulation;with supervision;3-in-1;with DME;with cueing (comment type and amount) ADL Goal: Toilet Transfer - Progress: Goal set today Pt Will Perform Toileting - Clothing Manipulation: with supervision;Standing;with cueing (comment type and amount) ADL Goal: Toileting - Clothing Manipulation - Progress: Goal set today Pt Will Perform Tub/Shower Transfer: Tub transfer;with DME;Transfer tub bench;with  supervision;with caregiver independent in assisting ADL Goal: Tub/Shower Transfer - Progress: Goal set today  Visit Information  Last OT Received On: 06/23/12    Subjective Data      Prior Functioning  Home Living Lives With: Spouse;Family Available Help at Discharge: Family Type of Home: House Home Access: Stairs to enter [employer] of Steps: 3 Entrance Stairs-Rails: Right Home  Layout: One level Bathroom Shower/Tub: Associate Professor: Yes How Accessible: Accessible via walker Home Adaptive Equipment: Bedside commode/3-in-1;Reacher;Tub transfer bench Additional Comments: rental home Prior Function Level of Independence: Independent Able to Take Stairs?: Yes Driving: No Comments: family does cooking Communication Communication: No difficulties Dominant Hand: Right    Cognition  Overall Cognitive Status: Impaired Area of Impairment: Attention;Safety/judgement;Executive functioning;Problem solving;Awareness of errors Arousal/Alertness: Awake/alert (closing eyes frequently . Son states pt sleeps alot) Orientation Level: Oriented X4 / Intact Behavior During Session: [facility] for tasks performed Current Attention Level: Sustained Attention - Other Comments: easily distracted Safety/Judgement: Decreased awareness of safety precautions;Decreased safety judgement for tasks assessed;Decreased awareness of need for assistance Awareness of Errors: Assistance required to identify errors made (unaware that she was leaning on therapist) Problem Solving: delayed processing Executive Functioning: decreased self monitoring, attention, higher level problem solving    Extremity/Trunk Assessment Right Upper Extremity Assessment RUE ROM/Strength/Tone: WFL for tasks assessed RUE Sensation: WFL - Light Touch RUE Coordination: WFL - gross/fine motor Left Upper Extremity Assessment LUE ROM/Strength/Tone: Within functional levels LUE  Sensation: WFL - Light Touch;WFL - Proprioception LUE Coordination: WFL - gross/fine motor Trunk Assessment Trunk Assessment: Kyphotic;Other exceptions (forward head.)   Mobility Transfers Transfers: Sit to Stand;Stand to Sit Sit to Stand: 3: Mod assist;From chair/3-in-1 Stand to Sit: 3: Mod assist;With upper extremity assist;To chair/3-in-1 Details for Transfer Assistance: Pt with posterior lean initially. Cues to not brace against chair. R lat lean upon initial stand   Exercise    Balance Balance Balance Assessed:  (rec BERG)  End of Session OT - End of Session Equipment Utilized During Treatment: Gait belt Activity Tolerance: Patient tolerated treatment well Patient left: in chair;with call bell/phone within reach;with family/visitor present  GO Functional Assessment Tool Used: clinical judgement Functional Limitation: Self care Self Care Current Status (Z6109): At least 60 percent but less than 80 percent impaired, limited or restricted Self Care Goal Status (U0454): At least 1 percent but less than 20 percent impaired, limited or restricted   [facility] 06/23/2012, 11:51 AM [facility], OTR/L  [phone] 06/23/2012

## 2012-06-23 NOTE — Telephone Encounter (Signed)
Medical Alert Certificate was completed for [employer] and placed in the scan box. I will carry the original to the hospitalized patient.

## 2012-06-23 NOTE — Progress Notes (Signed)
Pt placed on CPAP in auto-titration mode due to being unaware of home settings. Pt using FFM and tolerating well for now. RT will continue to monitor.

## 2012-06-23 NOTE — Progress Notes (Signed)
ANTICOAGULATION CONSULT NOTE   Pharmacy Consult for Warfarin Indication: atrial fibrillation  Allergies  Allergen Reactions  . Aspirin Hives  . Penicillins Rash and Other (See Comments)    "doesn't do the job it's suppose to do; I get real hyperactive and I break out in a rash"   Patient Measurements: Height: 5\' 3"  (160 cm) Weight: 224 lb 8 oz (101.833 kg) IBW/kg (Calculated) : 52.4   Vital Signs: Temp: 98.5 F (36.9 C) (07/09 0933) Temp src: Oral (07/09 0933) BP: 107/62 mmHg (07/09 0933) Pulse Rate: 68  (07/09 0933)  Labs:  Basename 06/23/12 0550 06/22/12 1524  HGB -- 14.2  HCT -- 42.2  PLT -- 147*  LABPROT 26.1* --  INR 2.35* --  CREATININE 0.87 1.09   Estimated Creatinine Clearance: 76.4 ml/min (by C-G formula based on Cr of 0.87).  Medical History: Past Medical History  Diagnosis Date  . Abnormal CT of the head 12/16/1984  . Nonischemic cardiomyopathy   . Hypertension   . GERD (gastroesophageal reflux disease)   . Unspecified venous (peripheral) insufficiency   . Diaphragmatic hernia without mention of obstruction or gangrene   . Female stress incontinence   . Allergic rhinitis, cause unspecified   . Morbid obesity   . Lichenification and lichen simplex chronicus   . Pacemaker  st judes   . Heart murmur   . CHF (congestive heart failure)   . H/O hiatal hernia   . Anemia   . High cholesterol   . Pneumonia 2004; 2011; 11/2011  . Sleep apnea, obstructive   . Exertional dyspnea 06/22/12  . Migraine, unspecified, without mention of intractable migraine without mention of status migrainosus   . Seizures   . Stroke 1988  . Sick sinus syndrome   . Atrial fibrillation   . Syncope and collapse 06/22/12  . Arthritis     Medications:  Prescriptions prior to admission  Medication Sig Dispense Refill  . digoxin (LANOXIN) 0.125 MG tablet Take 125 mcg by mouth daily.      . enalapril (VASOTEC) 5 MG tablet Take 2.5-5 mg by mouth 2 (two) times daily. Take 1 tablet  every morning and take one-half tablet every night at bedtime.      . fluticasone (FLONASE) 50 MCG/ACT nasal spray Place 2 sprays into the nose daily as needed. For congestion.      . furosemide (LASIX) 80 MG tablet Take 1 tablet (80 mg total) by mouth 2 (two) times daily.  60 tablet  1  . furosemide (LASIX) 80 MG tablet Take 80 mg by mouth 2 (two) times daily.      . metoprolol succinate (TOPROL-XL) 100 MG 24 hr tablet Take 100 mg by mouth 2 (two) times daily. Take with or immediately following a meal.      . omeprazole (PRILOSEC) 20 MG capsule Take 20 mg by mouth daily.      Marland Kitchen PHENobarbital (LUMINAL) 32.4 MG tablet Take 97.2 mg by mouth at bedtime.      . phenytoin (DILANTIN) 100 MG ER capsule Take 300-400 mg by mouth 2 (two) times daily. Take 300mg  every morning and take 400mg  every evening.      Marland Kitchen spironolactone (ALDACTONE) 25 MG tablet Take 25 mg by mouth daily.      Marland Kitchen warfarin (COUMADIN) 5 MG tablet Take 5 mg by mouth daily.       Assessment: 1 YOF on chronic warfarin for AFib admitted 06/22/12 s/p fall striking head and history of heart failure (EF  25-35%). Patient was recently discharged on 06/19/12 for CHF exacerbation. INR in clinic this am was 3.2 per Dr. Marinell Blight note.  Head CT was negative for hemorrhagic complications from fall.  INR today is 2.35 which is down quite a bit after holding dose yesterday.  No noted bleeding this morning.  HOME Dose:  Warfarin 5mg  daily.  Goal of Therapy:  INR 2-3   Plan:  1. Will give Warfarin 7.5 mg today and resume her home regimen of 5mg  daily..  2.  Monitor PT/INR  Nadara Mustard, PharmD., MS Clinical Pharmacist Pager:  [phone]  Thank you for allowing pharmacy to be part of this patients care team. 06/23/2012,9:51 AM

## 2012-06-23 NOTE — Progress Notes (Signed)
I will follow her progress functionally with therapy but expect her to be able to progress to be able to go home with home health. Please call me with questions. [phone]

## 2012-06-24 LAB — URINALYSIS, ROUTINE W REFLEX MICROSCOPIC
Bilirubin Urine: NEGATIVE
Hgb urine dipstick: NEGATIVE
Ketones, ur: NEGATIVE mg/dL
Nitrite: NEGATIVE
Protein, ur: NEGATIVE mg/dL
Specific Gravity, Urine: 1.013 (ref 1.005–1.030)
Urobilinogen, UA: 1 mg/dL (ref 0.0–1.0)

## 2012-06-24 LAB — PROTIME-INR
INR: 1.88 — ABNORMAL HIGH (ref 0.00–1.49)
Prothrombin Time: 21.9 seconds — ABNORMAL HIGH (ref 11.6–15.2)

## 2012-06-24 NOTE — Progress Notes (Signed)
Recommend home with home health when medically ready. [phone]

## 2012-06-24 NOTE — Progress Notes (Signed)
Family Medicine Teaching Service Daily Progress Note Intern Pager: [phone]  Patient name: MAJORIE SANTEE Medical record number: [id number] Date of birth: Jan 21, 1951 Age: 61 y.o. Gender: female  Primary Care Provider: Tobin Chad, MD  Subjective: Pt seen at beside this morning. States she slept very well though she is adjusting to the hospital CPAP as it "blows a little different" than the one she had at home. Otherwise no complaints other than some mild SOB when she walks or moves around. Still with some unsteadiness walking, particularly on the right side.  Objective: Temp:  [97.7 F (36.5 C)-98.6 F (37 C)] 97.8 F (36.6 C) (07/10 0937) Pulse Rate:  [50-84] 50  (07/10 0937) Resp:  [19-20] 19  (07/10 0937) BP: (94-128)/(52-97) 109/61 mmHg (07/10 0937) SpO2:  [92 %-99 %] 96 % (07/10 0937) Weight:  [226 lb 11.2 oz (102.83 kg)] 226 lb 11.2 oz (102.83 kg) (07/09 2100) Exam: General: sitting in chair in NAD, alert and appropriate in conversation, very pleasant Cardiovascular: RRR, no rub/murmur noted Respiratory: no increased work of breathing, good air movement bilaterally with soft crackles in left base Abdomen: BS+, soft, nontender/nondistended Extremities: distal pulses intact in bilateral lower extremities  Laboratory:  Lab 06/22/12 1524 06/19/12 0515 06/18/12 0648  WBC 8.3 7.9 7.6  HGB 14.2 14.8 14.3  HCT 42.2 44.7 43.0  PLT 147* 142* 153    Lab 06/23/12 0550 06/22/12 1524 06/18/12 0648  NA 132* 131* 133*  K 4.8 4.7 5.1  CL 95* 93* 93*  CO2 26 27 31   BUN 49* 56* 31*  CREATININE 0.87 1.09 0.93  LABGLOM -- -- --  GLUCOSE 102* -- --  CALCIUM 9.1 9.3 9.6    Lab 06/24/12 0500 06/23/12 1629 06/23/12 0550  INR 1.88* 3.2 2.35*   Phenytoin level: PENDING Phenobarbital level: PENDING  UA: to be collected  Imaging/Diagnostic Tests: CXR, 7/8 IMPRESSION:  Resolution of pleural effusions and interstitial edema. The  patient has persistent cardiomegaly and  pulmonary vascular  Congestion.  Knee 4 view, 7/8 IMPRESSION:  No acute abnormalities.  CT Head w/o contrast, 7/8 IMPRESSION:  1. No acute intracranial abnormality.  2. Chronic right hemisphere infarcts.   Assessment and Plan: Cloris Flippo is a 61yo female with history of CHF (EF 20-25%), HTN, afib, and history of seizure disorder who was recently discharged from our service, now here with fall, weakness, somnolence. Admitted 7/8.   1. Fall - Possibly due to orthostasis given recent admission and aggressive diuresis at that time for CHF exacerbation. Pt with history of seizure disorder; see below. CT scan without acute intracranial process; stroke unlikely. Knee x-ray normal.  PLAN - Will hold off on any further stroke work-up for now; may recommend lipid panel as outpt for follow-up as the last check was >7 months ago. CIR consult appreciated, recommends home health PT which patient is agreeable to.  2. Seizure disorder - Per pt history. Pt on Dilantin and Phenobarb prescribed by Dr. Sheffield Slider. These may have contributed to any somnolence/fall. Dilantin level high on 7/9, phenobarb level normal; given pt is also on chronic Coumadin, Dilantin may be hard to manage. PLAN - Hold Dilantin dose today and tomorrow with follow-up levels. Will restart once toxicity resolved.  3. CHF - Pt with recent admission requiring aggressive diuresis. BNP 1890 on admission. No current gross fluid overload. Some SOB and crackles on exam, 7/10. PLAN - Monitor for fluid overload. Continue home digoxin. Low sodium diet. Will consider gentle diuresis.  3. A-fib -  Pt on metoprolol, coumadin; INR supratherapeutic in clinic on 7/8 (3.2). INR 2.35 --> 3.2 on 7/9, 1.88 on 7/10. May be difficult to manage with Dilantin, especially while supratherapeutic. PLAN - Coumadin dosing per pharmacy; held on 7/8. Pt on Xarelto while managing Dilantin toxicity, to be restarted on Coumadin afterwards. Continue and monitor INRs.  4. OSA  - Pt on CPAP at home; machine is not working correctly and was taken by family to [facility] and were told it is not repairable. Case management contacted for setup of new CPAP machine with 2L O2 at night, only.  PPx: No VTE prophylaxis; pt on Coumadin therapy for a-fib.  Dispo: home with home health pending med adjustments and HH arrangements  Code Status: DNR  Bobbye Morton, MD PGY-1, [facility] Family Medicine FPTS Intern pager: [phone]

## 2012-06-24 NOTE — Progress Notes (Signed)
Placed patient on CPAP autotitration QHS.  Patient tolerating CPAP well. 

## 2012-06-24 NOTE — Progress Notes (Signed)
Family Medicine Teaching Service Attending Note  I interviewed and examined patient Misty Gray and reviewed their tests and x-rays.  I discussed with Dr. Casper Harrison and reviewed their note for today.  I agree with their assessment and plan.     Additionally  Feeling better wants to go home On xarelto now - why not keep her on this chronically instead of warfarin? Discharge once balance is better and dilantin level is therapeutic

## 2012-06-24 NOTE — Progress Notes (Signed)
Physical Therapy Treatment Patient Details Name: Misty Gray MRN: [id number] DOB: 1951-02-20 Today's Date: 06/24/2012 Time: 7829-5621 PT Time Calculation (min): 13 min  PT Assessment / Plan / Recommendation Comments on Treatment Session  Admitted with fall, somnolence, Dilantin toxicity, which is resolving well; Much improved balance and gait, still recommend Rolling Walker for home; Agree with HHPT follow-up    Follow Up Recommendations  Home health PT;Supervision/Assistance - 24 hour    Barriers to Discharge        Equipment Recommendations  Rolling walker with 5" wheels    Recommendations for Other Services    Frequency Min 3X/week   Plan Discharge plan needs to be updated    Precautions / Restrictions Precautions Precautions: Fall Precaution Comments: Much better balance than initial eval this session   Pertinent Vitals/Pain Noted some shortness of breath; pt reports she's short of breath a lot; recovered quickly once in chair    Mobility  Transfers Transfers: Sit to Stand;Stand to Sit Sit to Stand: 4: Min guard;From chair/3-in-1;With upper extremity assist Stand to Sit: 4: Min guard;To chair/3-in-1 Details for Transfer Assistance: Cues for safety, hand placement Ambulation/Gait Ambulation/Gait Assistance: 4: Min guard (with and without physical contact) Ambulation Distance (Feet): 120 Feet Assistive device: Rolling walker Ambulation/Gait Assistance Details: Much improved balance and steadiness with amb with the significant R lean noted yesterday largely resolved; continued slow pace and cues for upright posture Gait Pattern: Decreased stride length    Exercises     PT Diagnosis:    PT Problem List:   PT Treatment Interventions:     PT Goals Acute Rehab PT Goals Time For Goal Achievement: 07/07/12 Pt will go Sit to Stand: with supervision PT Goal: Sit to Stand - Progress: Progressing toward goal Pt will go Stand to Sit: with supervision PT Goal: Stand to Sit  - Progress: Progressing toward goal Pt will Ambulate: >150 feet;with supervision;with least restrictive assistive device;with rolling walker PT Goal: Ambulate - Progress: Progressing toward goal  Visit Information  Last PT Received On: 06/24/12 Assistance Needed: +1    Subjective Data  Subjective: Agreeable to amb; seems happy that she's feeling better Patient Stated Goal: Be able to get up and around ok   Cognition  Arousal/Alertness: Awake/alert Orientation Level: Oriented X4 / Intact Behavior During Session: [facility] for tasks performed Attention - Other Comments: easily distracted Problem Solving: delayed processing    Balance     End of Session PT - End of Session Activity Tolerance: Patient tolerated treatment well Patient left: in chair;with call bell/phone within reach (SW in room) Nurse Communication: Mobility status   GP Functional Assessment Tool Used: clinical judgement Functional Limitation: Mobility: Walking and moving around Mobility: Walking and Moving Around Current Status (H0865): At least 20 percent but less than 40 percent impaired, limited or restricted Mobility: Walking and Moving Around Goal Status [id number]): At least 1 percent but less than 20 percent impaired, limited or restricted   Van Clines [facility] [place], [place] [phone]  06/24/2012, 1:45 PM

## 2012-06-24 NOTE — Progress Notes (Signed)
This was a follow-up visit.  Pt was sitting up in her chair by her bed, writing in journal.  She was very welcoming.  Pt told me her life's narrative and shared with me how her faith has brought her through.  Pt is very spiritual.  Pt has questions about her will (not advanced directive).  I will alert SW.  I will continue to follow as needed.  Please page me if further assistance is needed. Misty Gray  [phone]  Personal pager   [phone]  oncall pager  06/24/12 1000  Clinical Encounter Type  Visited With Patient and family together  Visit Type Follow-up  Referral From Nurse  Spiritual Encounters  Spiritual Needs Emotional;Prayer  Stress Factors  Patient Stress Factors Major life changes;Loss of control;Health changes;Family relationships  Family Stress Factors Not reviewed

## 2012-06-24 NOTE — Clinical Social Work Psychosocial (Addendum)
Clinical Social Work Department BRIEF PSYCHOSOCIAL ASSESSMENT 06/24/2012  Patient:  Misty Gray, Misty Gray     Account Number:  [id number]     Admit date:  06/22/2012  Clinical Social Worker:  Delmer Islam  Date/Time:  06/24/2012 12:11 PM  Referred by:  RN  Date Referred:   Referred for  Other - See comment   Other Referral:   Patient requested to receive help updating her will.   Interview type:  Patient Other interview type:    PSYCHOSOCIAL DATA Living Status:  HUSBAND Admitted from facility:   Level of care:   Primary support name:  Misty Gray Primary support relationship to patient:  SPOUSE Degree of support available:   Good. Patient also has a son that lives in the home. Patient's has 4 children: Misty Gray, Misty Gray, Misty Gray, and Misty Gray. One son is incarcerated per patient.    CURRENT CONCERNS Current Concerns  Other - See comment   Other Concerns:   Updating her will    SOCIAL WORK ASSESSMENT / PLAN CSW talked with patient about her request and advised that her lawyer will have to update her will.  We discussed advanced directives and patient opted to do Living Will. CSW went through Living Will with patient, explained and answered questions and patient initialed areas of her choosing. Witnesses were secured and document signed and notarized. Copies made and patient advised to assure PCP receives a copy of the document. We also reviewed the Health Care POA, however after our discussion, patient opted not to do this Advanced Directive at this time. CSW explained that it can be done with any licensed notary.   Assessment/plan status:  No Further Intervention Required Other assessment/ plan:   Information/referral to community resources:   Patient given Advanced Directive packet    PATIENT'S/FAMILY'S RESPONSE TO PLAN OF CARE: Patient was alert and oriented and very pleasant. She expressed appreciation of CSW''s assistance.

## 2012-06-25 ENCOUNTER — Inpatient Hospital Stay: Payer: Medicare Other

## 2012-06-25 ENCOUNTER — Other Ambulatory Visit: Payer: Self-pay | Admitting: Family Medicine

## 2012-06-25 DIAGNOSIS — R569 Unspecified convulsions: Secondary | ICD-10-CM

## 2012-06-25 MED ORDER — RIVAROXABAN 20 MG PO TABS: 20.0000 mg | ORAL_TABLET | Freq: Every day | ORAL | Status: DC

## 2012-06-25 MED ORDER — ENALAPRIL MALEATE 5 MG PO TABS: 2.5000 mg | ORAL_TABLET | Freq: Two times a day (BID) | ORAL | Status: DC

## 2012-06-25 MED ORDER — DIGOXIN 125 MCG PO TABS: 125.0000 ug | ORAL_TABLET | Freq: Every day | ORAL | Status: DC

## 2012-06-25 MED ORDER — METOPROLOL SUCCINATE ER 100 MG PO TB24: 100.0000 mg | ORAL_TABLET | Freq: Every day | ORAL | Status: DC

## 2012-06-25 NOTE — Progress Notes (Signed)
PHARMACY CONSULT-DILANTIN  61 y/o female patient is on chronic Dilantin for history of seizures.  The patient has current phenytoin level of : Phenytoin Lvl  Date Value Range Status  06/25/2012 26.4* 10.0 - 20.0 ug/mL Final  06/23/2012 36.1* 10.0 - 20.0 ug/mL Final     CRITICAL RESULT CALLED TO, READ BACK BY AND VERIFIED WITH:     K.GOODNIGHT,RN 06/23/12 1346 BY BSLADE     Creat  Date Value Range Status  11/05/2011 0.73  0.50 - 1.10 mg/dL Final     Creatinine, Ser  Date Value Range Status  06/23/2012 0.87  0.50 - 1.10 mg/dL Final   Assessment/Plan:  The latest dose of Dilantin taken 7/9 with a resulting phenytoin level today of 36.1 >> 26.4 mcg/mL and  the last Albumin reported as 3.8 gm/dl.      Maintenance doses of Dilantin will be continued to be held.  Will reorder Dilantin level for 7/13 (Saturday) and restart Dilantin as indicated.  Phenobarbital will be continued as currently ordered, but will add phenobarbital level Saturday as well.     If repeat Dilantin levels are greater than 18, will assess the need for a free phenytoin level and any adjustments required as a result of side effects (i.e. drowsiness, ataxia, lethargy, nausea, vomiting).  Manning Luna, Elisha Headland, Pharm.D.  06/25/2012 2:46 PM

## 2012-06-25 NOTE — Progress Notes (Signed)
Family Medicine Teaching Service Attending Note  I interviewed and examined patient Misty Gray and reviewed their tests and x-rays.  I discussed with Dr. Casper Harrison and reviewed their note for today.  I agree with their assessment and plan.     Additionally  Good spirits Able to stand and walk using walker without other assistance - does not appear unsteady Ok to discharge home when can be arranged on factor Xa inhibitor instead of warfarin Restart dilantin when below 20

## 2012-06-25 NOTE — Progress Notes (Signed)
Physical Therapy Note   06/25/12 1300  PT Visit Information  Last PT Received On 06/25/12  Assistance Needed +1  PT Time Calculation  PT Start Time 1159  PT Stop Time 1232  PT Time Calculation (min) 33 min  Precautions  Precautions Fall  Restrictions  Weight Bearing Restrictions No  Cognition  Overall Cognitive Status Appears within functional limits for tasks assessed/performed  Arousal/Alertness Awake/alert  Orientation Level Oriented X4 / Intact  Behavior During Session [facility] for tasks performed  Current Attention Level Sustained  Attention - Other Comments easily distracted  Transfers  Transfers Sit to Stand;Stand to Sit  Sit to Stand 4: Min guard;From chair/3-in-1;With upper extremity assist  Stand to Sit 4: Min guard;To chair/3-in-1;With armrests;With upper extremity assist  Details for Transfer Assistance Cues for hand placement and safety  Ambulation/Gait  Ambulation/Gait Assistance 4: Min guard  Ambulation Distance (Feet) 200 Feet  Assistive device Rolling walker  Ambulation/Gait Assistance Details slow cadence, takes standing rest breaks at times.  Needs cues to guide RW.  Gait Pattern Step-through pattern;Decreased stride length  Stairs Yes  Stairs Assistance 4: Min guard  Stair Management Technique One rail Right;Forwards  Number of Stairs 2   PT - End of Session  Equipment Utilized During Treatment Gait belt  Activity Tolerance Patient tolerated treatment well  Patient left in chair;with call bell/phone within reach;with family/visitor present  Nurse Communication Mobility status  PT - Assessment/Plan  Comments on Treatment Session Pt with improved balance and gait today.  PT Plan Discharge plan remains appropriate;Frequency remains appropriate  PT Frequency Min 3X/week  Follow Up Recommendations Home health PT;Supervision/Assistance - 24 hour  Equipment Recommended Rolling walker with 5" wheels  Acute Rehab PT Goals  Time For Goal Achievement 07/07/12    Potential to Achieve Goals Good  PT Goal: Sit to Stand - Progress Progressing toward goal  PT Goal: Stand to Sit - Progress Progressing toward goal  PT Goal: Ambulate - Progress Progressing toward goal  PT Goal: Up/Down Stairs - Progress Progressing toward goal  PT General Charges  $$ ACUTE PT VISIT 1 Procedure  PT Treatments  $Gait Training 23-37 mins     Marijo Sanes [place], [place] [phone]

## 2012-06-25 NOTE — Progress Notes (Signed)
Family Medicine Teaching Service Daily Progress Note Intern Pager: [phone]  [name] Medical record number: [id number] Date of birth: 1951/01/14 Age: 61 y.o. Gender: female  Primary Care Provider: Tobin Chad, MD  Subjective: Pt seen at beside this morning. States she feels very well and "isn't drunk any more" (referring to her Dilantin level being high). In good spirits, eager to go home, feels more steady on her feet when she walks with assistance.  Objective: Temp:  [97.5 F (36.4 C)-98.2 F (36.8 C)] 97.5 F (36.4 C) (07/11 0526) Pulse Rate:  [70-90] 74  (07/11 0947) Resp:  [18-20] 18  (07/11 0526) BP: (109-118)/(71-86) 109/86 mmHg (07/11 0947) SpO2:  [97 %-100 %] 99 % (07/11 0526) Weight:  [227 lb 8.2 oz (103.2 kg)] 227 lb 8.2 oz (103.2 kg) (07/10 2044) Exam: General: sitting in chair in NAD eating breakfast, alert and oriented, very pleasant Cardiovascular: RRR, no rub/murmur noted Respiratory: no increased work of breathing, good air movement bilaterally with soft crackles in left base, stable from previous days Abdomen: BS+, soft, nontender/nondistended Extremities: distal pulses intact in bilateral lower extremities  Laboratory:  Lab 06/22/12 1524 06/19/12 0515  WBC 8.3 7.9  HGB 14.2 14.8  HCT 42.2 44.7  PLT 147* 142*    Lab 06/23/12 0550 06/22/12 1524  NA 132* 131*  K 4.8 4.7  CL 95* 93*  CO2 26 27  BUN 49* 56*  CREATININE 0.87 1.09  LABGLOM -- --  GLUCOSE 102* --  CALCIUM 9.1 9.3    Lab 06/24/12 0500 06/23/12 1629 06/23/12 0550  INR 1.88* 3.2 2.35*   Phenytoin level: PENDING Phenobarbital level: PENDING  UA: to be collected  Imaging/Diagnostic Tests: CXR, 7/8 IMPRESSION:  Resolution of pleural effusions and interstitial edema. The  patient has persistent cardiomegaly and pulmonary vascular  Congestion.  Knee 4 view, 7/8 IMPRESSION:  No acute abnormalities.  CT Head w/o contrast, 7/8 IMPRESSION:  1. No acute  intracranial abnormality.  2. Chronic right hemisphere infarcts.   Assessment and Plan: Misty Gray is a 61yo female with history of CHF (EF 20-25%), HTN, afib, and history of seizure disorder who was recently discharged from our service, now here with fall, weakness, somnolence. Admitted 7/8.   1. Fall - Possibly due to orthostasis given recent admission and aggressive diuresis at that time for CHF exacerbation. Pt with history of seizure disorder; see below. CT scan without acute intracranial process; stroke unlikely. Knee x-ray normal.  PLAN - Will hold off on any further stroke work-up for now; may recommend lipid panel as outpt for follow-up as the last check was >7 months ago. CIR consult appreciated, recommends home health PT which patient is agreeable to.  2. Seizure disorder - Per pt history. Pt on Dilantin and Phenobarb prescribed by Dr. Sheffield Gray. These may have contributed to any somnolence/fall. Dilantin level high on 7/9, phenobarb level normal. Dilantin level remains high, trending down on 7/11. Likely will switch Coumadin to Xarelto long-term to avoid difficulty with Coumadin. PLAN -  Will restart Dilantin once toxicity resolved, possibly tomorrow.  3. CHF - Pt with recent admission requiring aggressive diuresis. BNP 1890 on admission. No current gross fluid overload. No subjective SOB on 7/11, mild crackles in LLL field. PLAN - Continue home digoxin. Low sodium diet.  3. A-fib - Pt on metoprolol, previously on coumadin; INR supratherapeutic in clinic on 7/8 (3.2). INR 2.35 --> 3.2 on 7/9, 1.88 on 7/10. May be difficult to manage with Dilantin, especially while  supratherapeutic. PLAN - Pt on Xarelto while managing Dilantin toxicity, likely will be switching to this from Coumadin, long-term.  4. OSA - Pt on CPAP at home; machine is not working correctly and was taken by family to [facility] and were told it is not repairable. Case management contacted for setup of new CPAP machine with 2L O2 at  night, only.  PPx: No VTE prophylaxis; pt on Coumadin therapy for a-fib.  Dispo: home with home health pending med adjustments and HH arrangements  Code Status: DNR  Misty Morton, MD PGY-1, [facility] Family Medicine FPTS Intern pager: [phone]

## 2012-06-25 NOTE — Progress Notes (Signed)
Have spoken with the patient.  She will hold dilantin until instructed by [place].  We will call her son, [name], with instructions tomorrow after her blood draw.

## 2012-06-25 NOTE — Discharge Summary (Signed)
Family Medicine Teaching [facility] Discharge Summary  [name] Medical record number: [id number] Date of birth: 10-28-1951 Age: 61 y.o. Gender: female Date of Admission: 06/22/2012  Date of Discharge: 7/11 Admitting Physician: Carney Living, MD  Primary Care Provider: Tobin Chad, MD  Indication for Hospitalization: fall Discharge Diagnoses:  Dilantin toxicity Seizure disorder Chronic CHF with systolic dysfunction Atrial fibrillation OSA  Consultations: Pharmacy (coumadin/xarelto, phenytoin levels)  Significant Labs and Imaging:   Lab 06/22/12 1524  WBC 8.3  HGB 14.2  HCT 42.2  PLT 147*    Lab 06/23/12 0550 06/22/12 1524  NA 132* 131*  K 4.8 4.7  CL 95* 93*  CO2 26 27  BUN 49* 56*  CREATININE 0.87 1.09  LABGLOM -- --  GLUCOSE 102* --  CALCIUM 9.1 9.3   Dilantin Levels: 7/9: 36.1 (H) 7/11: 26.4 (H)  CT Head, 7/8 @1556  IMPRESSION:  1. No acute intracranial abnormality.  2. Chronic right hemisphere infarcts.  L Knee X-ray, 7/8 IMPRESSION:  No acute abnormalities.   Procedures: none  Brief Hospital Course: Pt was admitted on 7/8 for a fall at home in which she struck her head and knee; pt with recent admission for CHF requiring aggressive diuresis. CT on admission was negative for a new stroke and knee x-ray was also normal. Pt was not believed to have a stroke at this point and was found to be unsteady on her feet due to phenytoin toxicity with a supratherapeutic level of 36 on 7/9. During previous admission, pt was started on coumadin for new-onset a-fib, which was believed to have contributed to her supratherapeutic phenytoin levels. The decision was made to hold her dilantin until her levels trend down, and to switch her antithrombosis prophylaxis to Xarelto to avoid drug-drug interaction with Coumadin. The decison was made to monitor this on an outpatient basis as the patient had improved to the point of being able to  ambulate with her walker and no further assistance. Her Dilantin is being stopped temporarily until her drug levels normalize.  Throughout her stay the patient was afebrile with stable vital signs and without fluid overload; she did not require any further diuresis. She is being dischaged with home health RN for CHF and PT. She was instructed to follow up in clinic the day after discharge for a Dilantin level check and will be instructed when to restart her medication, after that.  During her previous admission there was an attempt to get her CPAP machine replaced, as her current machine at home is broken. With case management assistance, it was established that Medicare will not pay for a new CPAP until 2014, so pt will have to make arrangements to have her current machine repaired or to rent a new one.  Discharge Medications:  Medication List  As of 06/26/2012  5:54 PM   STOP taking these medications         phenytoin 100 MG ER capsule      warfarin 5 MG tablet         TAKE these medications         digoxin 0.125 MG tablet   Commonly known as: LANOXIN   Take 1 tablet (125 mcg total) by mouth daily.      enalapril 5 MG tablet   Commonly known as: VASOTEC   Take 0.5 tablets (2.5 mg total) by mouth 2 (two) times daily. Take 1 tablet every morning and take one-half tablet every night at bedtime.  fluticasone 50 MCG/ACT nasal spray   Commonly known as: FLONASE   Place 2 sprays into the nose daily as needed. For congestion.      furosemide 80 MG tablet   Commonly known as: LASIX   Take 1 tablet (80 mg total) by mouth 2 (two) times daily.      metoprolol succinate 100 MG 24 hr tablet   Commonly known as: TOPROL-XL   Take 1 tablet (100 mg total) by mouth daily. Take with or immediately following a meal.      omeprazole 20 MG capsule   Commonly known as: PRILOSEC   Take 20 mg by mouth daily.      PHENobarbital 32.4 MG tablet   Commonly known as: LUMINAL   Take 97.2 mg by mouth  at bedtime.      Rivaroxaban 20 MG Tabs   Commonly known as: XARELTO   Take 1 tablet (20 mg total) by mouth daily with supper.      spironolactone 25 MG tablet   Commonly known as: ALDACTONE   Take 25 mg by mouth daily.           Issues for Follow Up:  1. Address Dilantin level and when to restart. 2. Address compliance with new/changed medications. 3. Follow-up whether or not patient will be getting her CPAP machine fixed or renting a new one.  Outstanding Results: none  Discharge Instructions: Please refer to Patient Instructions section of EMR for full details.  Patient was counseled important signs and symptoms that should prompt return to medical care, changes in medications, dietary instructions, activity restrictions, and follow up appointments.   Follow-up Information    Follow up with [facility] on 06/26/2012. (9am - Lab appointment)    Contact information:   [address] [place] [address]       Follow up with Despina Hick, MD on 06/30/2012. (3pm)    Contact information:   [address] [place] [address] [phone]          Discharge Condition: stable  [address] [place], MD 06/26/2012, 5:54 PM

## 2012-06-25 NOTE — Progress Notes (Signed)
   CARE MANAGEMENT NOTE 06/25/2012  Patient:  Misty Gray, Misty Gray   Account Number:  [id number]  Date Initiated:  06/25/2012  Documentation initiated by:  Darlyne Russian  Subjective/Objective Assessment:   Patient admitted with hypotension.     Action/Plan:   Progression of care and discharge planning   Anticipated DC Date:  06/25/2012   Anticipated DC Plan:  HOME W HOME HEALTH SERVICES      DC Planning Services  CM consult      Choice offered to / List presented to:          [facility] arranged  HH-4 NURSE'S AIDE  HH-1 RN  HH-2 PT      [facility] agency  [facility]   Status of service:  In process, will continue to follow Medicare Important Message given?   (If response is "NO", the following Medicare IM given date fields will be blank) Date Medicare IM given:   Date Additional Medicare IM given:    Discharge Disposition:    Per UR Regulation:    If discussed at Long Length of Stay Meetings, dates discussed:    Comments:  06/25/2012  1200 Darlyne Russian RN, [place] [phone] Met with patient and family regarding dishcarge planning for home health services. At last discharge patient set up [facility] for home health services agreed to continue for RN and PT visits. Discussed the CPAP machine, the son reports he went to [facility] and was told the machine is so old the parts are no longer available to repair, the O2 from her concentrator was not going through the machine. CM spoke with [facility] representative and told the patient would have to pay $50 up front for repair and could rent one during repair for $90. The patient's son said Dr Sheffield Slider has the paper work and needs to fax to [facility] for a CPAP. Advised the son to contact Dr Martin Majestic office and ask if the form had been faxed to [facility]. They received a call yesterday about delivery of the RW, she already has the 3:1 commode. [facility]/ Hilda Lias called for restart of ith update regarding RN and PT

## 2012-06-25 NOTE — Progress Notes (Signed)
I have spoken with both care management and the patient.  Medicare will not approve her for another CPAP until 2014.  [facility] says they can repair the machine for $50 and she can rent a machine for $90/month.  Patient reports that she does not have the money for this.  Unfortunately, we do not have any other options.  She will be discharged home with our recommendations that she work very hard to find the money to pay for a rental cpap and repair of her own.  Unfortunately, no one has been able to come up with a way of doing anything else to help her.

## 2012-06-26 ENCOUNTER — Other Ambulatory Visit: Payer: Medicare Other

## 2012-06-26 DIAGNOSIS — R569 Unspecified convulsions: Secondary | ICD-10-CM

## 2012-06-26 NOTE — Progress Notes (Signed)
PHENYTION LEVEL DONE TODAY SENT OUT STAT Dajon Lazar

## 2012-06-27 NOTE — Discharge Summary (Signed)
I have reviewed this discharge summary and agree.    

## 2012-06-30 ENCOUNTER — Ambulatory Visit (INDEPENDENT_AMBULATORY_CARE_PROVIDER_SITE_OTHER): Payer: Medicare Other | Admitting: Emergency Medicine

## 2012-06-30 VITALS — BP 148/82 | HR 70 | Ht 63.0 in | Wt 226.0 lb

## 2012-06-30 DIAGNOSIS — R569 Unspecified convulsions: Secondary | ICD-10-CM

## 2012-06-30 DIAGNOSIS — I4891 Unspecified atrial fibrillation: Secondary | ICD-10-CM

## 2012-06-30 MED ORDER — WARFARIN SODIUM 5 MG PO TABS: 5.0000 mg | ORAL_TABLET | Freq: Every day | ORAL | Status: DC

## 2012-06-30 NOTE — Patient Instructions (Addendum)
I'm sorry you weren't able to get that medicine.  1. STOP rivaraxaban 2. STOP Phenytoin 3. START coumadin 5mg  daily in the evening  Please make an appointment Friday afternoon to get your INR checked with Kendal Hymen in the lab.  I will call [facility] and see what I can find out about your CPAP machine.  Follow up with Dr. Sheffield Slider in 1 month.

## 2012-06-30 NOTE — Assessment & Plan Note (Signed)
Will stop xarelto and restart coumadin at 5mg  every evening.  To come in for INR check Friday afternoon.

## 2012-06-30 NOTE — Progress Notes (Signed)
  Subjective:    Patient ID: Misty Gray, female    DOB: [birth year], 61 y.o.   MRN: [id number]  HPI Misty Gray is here for hospital follow up.  1. Phenytoin toxicity: Symptoms are much improved both per patient and patient's son.  Is using a walker, but much steadier on her feet.  No increase in seizure activity since holding the phenytoin.  2. A fib: Insurance will not pay for xarelto so has not been taking it since discharge.    I have reviewed and updated the following as appropriate: allergies and current medications   Review of Systems See HPI    Objective:   Physical Exam BP 148/82  Pulse 70  Ht 5\' 3"  (1.6 m)  Wt 226 lb (102.513 kg)  BMI 40.03 kg/m2 Gen: alert, cooperative, pleasant, NAD CV: irregular rhythm, normal rate, no murmurs Neuro: normal gait     Assessment & Plan:

## 2012-06-30 NOTE — Assessment & Plan Note (Signed)
Recently admitted with phenytoin toxicity.  Symptoms resolved.  Will stop phenytoin as restarting coumadin for a fib.  Will monitor for increase in seizure activity (reported as everything "fading" back).  If seizures increase, may need to restart phenytoin, but with caution given interactions with coumadin.

## 2012-07-01 ENCOUNTER — Telehealth: Payer: Self-pay | Admitting: Family Medicine

## 2012-07-01 NOTE — H&P (Signed)
Hospital admission H&P Chief Complaint: fell this morning, dizziness/weakness, unstable gait HPI: This is a 60-year-old female with a history of severe systolic heart failure (EF 25-30%), SSS s/p pacemaker, newly diagnosed atrial fibrillation on warfarin, seizure disorder on phenobarbital and Dilantin, and OSA on CPAP/2L O2 who presents today after a fall after voiding and who presents with dizziness and weakness.   After voiding, the patient's feet "got tangled up" and she fell around 0800-0900 today. Her left knee hit the carpet first, but she also hit her head on the carpet. She thinks she was lightheaded before she fell.  Her son brought her to [facility]. She denied lightheadedness with sitting, however, she was lightheaded when standing.   She was recently hospitalized and discharged on 06/19/2012 for CHF exacerbation. She had not taken her medications for 2 weeks after misplacing them following a move. She disuresced well, losing 10-15 pounds. At the time of discharge, she was put back on her home Lasix 80 mg bid and metoprolol XL 100 mg qd. Her enalapril and spironolactone were decreased due to hypotension. At the time of discharge, she was able to ambulate around her room without unsteadiness.   Since her discharge, her son reports that she has been wanting to "sleep all the time" and has been "wobbly".  She has been unable to get her new oxygen tank since her discharge since she needs a doctor to sign an order before the tank can be delivered. She is supposed to use oxygen with her CPAP machine at bedtime.   She is also complaining of left knee pain and a "knot" on her head today, injuries sustained after her fall.  Past Medical History  Diagnosis Date  . Abnormal CT of the head 12/16/1984    R parietal atrophy  . Sleep apnea, obstructive     CPAP started  . Nonischemic cardiomyopathy     EF has normalized-repeat Pending   . Sick sinus syndrome     WITH PRIOR DDD PACEMAKER IMPLANTATION  .  Hypertension   . GERD (gastroesophageal reflux disease)   . Seizure disorder   . Unspecified venous (peripheral) insufficiency   . Migraine, unspecified, without mention of intractable migraine without mention of status migrainosus   . Diaphragmatic hernia without mention of obstruction or gangrene   . Female stress incontinence   . Allergic rhinitis, cause unspecified   . Morbid obesity   . Lichenification and lichen simplex chronicus   . Atrial fibrillation   . Shortness of breath   . Pneumonia   . Pacemaker  st judes   . Heart murmur   . Stroke   . CHF (congestive heart failure)   . H/O hiatal hernia     two removed  . Seizures     two this week,   . Arthritis   . Anemia     Past Surgical History  Procedure Date  . Pacemaker insertion 12/16/1990    DDD EF 30%  . Ventral hernia repair 04/15/1998    Repeat  . Leg skin lesion  biopsy / excision 05/16/2001    Lichen Planus  . Pacemaker insertion 07/25/2003    replaced by BB, leads are both IS1 and from 1992, Most recent Gen Change by JA 05/21/11  . Insert / replace / remove pacemaker   . Hernia repair     Family History  Problem Relation Age of Onset  . Stroke Father     died from CAD?  . Cancer Mother       melanoma  . Diabetes Son     Type 1  . Sleep apnea Son   . Cancer Sister     cervical  . Hypertension Sister   . Hypertension Brother   . Rheum arthritis Daughter     Also PGM, PGGM   Social History:  reports that she has quit smoking. She has never used smokeless tobacco. She reports that she does not drink alcohol or use illicit drugs. She lives with her husband, son, and grandchildren at home.   Allergies:  Allergies  Allergen Reactions  . Aspirin     REACTION: urticaria  . Penicillins     REACTION: sweating, told she had a reaction in the hospital   ROS Denies fevers/chills Denies confusion Denies chest pain/palpitations Denies difficulty breathing Denies abdominal pain/nausea/vomiting. She is  constipated. Her last bowel movement was on 07/05, before her hospital discharge. She usually has 1-2 bowel movements a day. Denies dysuria/urgency/frequency  Temperature 97.5 F (36.4 C), temperature source Oral, SpO2 97.00%., HR 66, O2 sat 97% Physical Exam  GEN: Caucasian; NAD; morbidly obese PSYCH: appropriate to questions, engaged when spoken to, however, somnolent and closes her eyes when not actively interacting HEENT:   Head: [place], area of tenderness middle of forehead, between eyebrows; no bruising or hematoma   Eyes: normal conjunctiva   Ears: TM clear   Nose: no rhinorrhea or congestion   Throat: poor dentition; no oropharyngeal exudates or erythema NECK: no LAD CV: distant heart sounds; regular rate and rhythm; no murmurs/rubs/gallops; very faint radial pulses, worse on L PULM: NI WOB; CTAB without w/r/r; good effort and aeration ABD: NABS, obese, soft, NT EXT: bruising along shins; 0-1+ pretibial edema MSK: abrasion and tenderness along left lateral knee; no obvious effusion; ROM intact compared to right  NEURO:    CN: PERRL, EOMI with 3-4 beats of bilateral horizontal nystagmus, vision and hearing grossly intact, symmetric facies   Sensation: intact   Motor: 5/5 strength all extremities   Reflexes: 1-2+ brachioradialis and popliteal reflexes; no clonus   Coordination: slow finger-to-nose but without dysmetria   Gait: slow, very unstable, negative Romberg    Labs POCT INR: 3.1  Assessment/Plan This is a 60-year-old female with a history of severe systolic heart failure (EF 25-30%), SSS s/p pacemaker, newly diagnosed atrial fibrillation on warfarin, seizure disorder on phenobarbital and Dilantin, and OSA on CPAP/2L O2 who presents today after a fall after voiding and who presents with dizziness, weakness, unsteady gait, and somnolence.   Her fall may have been from orthostatic hypotension and following her voiding. She is on a large dose of Lasix (80 mg bid) and  spironolactone 25 mg qd. Several attempts by nurses and providers were made to take her blood pressure, however, we were unsuccessful. She did report lightheadedness with ambulation and she is very unsteady on her feet, which is very different from how she presented at the time of discharge a few days ago. Her weight today is 227.8 lbs. She was 222 lbs on 07/05 at the time of hospital discharge. However, discrepancy may be due to different scales.   Her somnolence may be attributable for her not having her home oxygen. She has a history of OSA and is supposed to use CPAP with 2L [place] at home.   -Will admit to [facility] for observation and monitor on telemetry.  -Will check CT-head without contrast to rule-out bleed following her fall and hitting her head. Her INR is also supratherapeutic today. -Will check basic   labs, CBC and BMET (especially for BUN/Cr). -Will check pro-BNP.  -Will re-start home CPAP and oxygen. Her O2 saturations today at rest are fine.  -Will hold her diuretics for now. -Will check orthostatic vital signs.  -Will check daily weights and strict I/O.  -Will hold enalapril, continue metoprolol, and continue digoxin.  -Will request PT/OT consultation for tomorrow. -Will consider consulting her cardiologist Dr. Allred.    CV History of severe systolic heart failure (EF25-30%) History of hypertension  History of recently diagnosed atrial fibrillation on warfarin History of SSS with pacemaker -See above   HEME/ONC Supratherapeutic INR -Will dose per pharmacy.  NEURO History of seizures -Stable on phenobarbital/Dilatin, which she has been on for a long time.  -Consider checking AM Dilatin level if patient's symptoms not improving.   GI History of GERD -Protonix  FEN/GI -IVF: SL -Diet: low salt diet  PPx -DVT PPx: on warfarin  DISPO: pending clinical improvement  CODE: DNR/DNI   OH PARK, ANGELA 06/22/2012, 11:31 AM 

## 2012-07-01 NOTE — Telephone Encounter (Signed)
Nurse from [facility] would like to discuss other option to replace her CPAP that currently is not working.

## 2012-07-01 NOTE — H&P (Signed)
This is a Family Medicine patient.  I have reviewed the documentation by Dr. Madolyn Frieze and agree.  She was seen by attending, Dr. Oda Cogan on 7/9 to confirm management.

## 2012-07-02 NOTE — Telephone Encounter (Signed)
Called [facility]. Pt's C-pap machine is not working anymore. Her insurance will not pay for a new one., but she can get it repaired for $50. She does not have the money now, but she can save $50 w/in 4 weeks.  [facility] told me, that they could give her a free C-pap machine for only 4 weeks. We need to write an order: C-pap machine Auto Titration pressure 5-20 Dx: sleep apnea  Will fax it to [facility] [phone] and also will call pt. Lorenda Hatchet, Renato Battles

## 2012-07-02 NOTE — Telephone Encounter (Signed)
Called Sheryce (ext [phone]) And informed of info I received from pt re: C-pap machine. She will call the pt and clarify and she received the order for the C-pap. Lorenda Hatchet, Renato Battles

## 2012-07-02 NOTE — Telephone Encounter (Signed)
Faxed the order. Spoke with pt. She told me, that [facility] [place] [facility] has given her different info. They told her, that they were not able to repair her old machine. Will call [facility] back and will speak with Sheryce again. Lorenda Hatchet, Renato Battles

## 2012-07-02 NOTE — Telephone Encounter (Signed)
Handwritten Rx for the CPAP machine with auto titration written, given to nursing to be faxed to [facility]. JB

## 2012-07-03 ENCOUNTER — Ambulatory Visit (INDEPENDENT_AMBULATORY_CARE_PROVIDER_SITE_OTHER): Payer: Medicare Other | Admitting: *Deleted

## 2012-07-03 DIAGNOSIS — Z7901 Long term (current) use of anticoagulants: Secondary | ICD-10-CM

## 2012-07-03 DIAGNOSIS — Z5181 Encounter for therapeutic drug level monitoring: Secondary | ICD-10-CM

## 2012-07-06 ENCOUNTER — Encounter: Payer: Self-pay | Admitting: Internal Medicine

## 2012-07-06 ENCOUNTER — Ambulatory Visit (INDEPENDENT_AMBULATORY_CARE_PROVIDER_SITE_OTHER): Payer: Medicare Other | Admitting: *Deleted

## 2012-07-06 ENCOUNTER — Ambulatory Visit (INDEPENDENT_AMBULATORY_CARE_PROVIDER_SITE_OTHER): Payer: Medicare Other | Admitting: Cardiology

## 2012-07-06 VITALS — BP 118/64 | Ht 62.0 in | Wt 226.0 lb

## 2012-07-06 DIAGNOSIS — I495 Sick sinus syndrome: Secondary | ICD-10-CM

## 2012-07-06 DIAGNOSIS — I4891 Unspecified atrial fibrillation: Secondary | ICD-10-CM

## 2012-07-06 DIAGNOSIS — Z7901 Long term (current) use of anticoagulants: Secondary | ICD-10-CM

## 2012-07-06 DIAGNOSIS — Z5181 Encounter for therapeutic drug level monitoring: Secondary | ICD-10-CM

## 2012-07-06 DIAGNOSIS — Z950 Presence of cardiac pacemaker: Secondary | ICD-10-CM

## 2012-07-06 DIAGNOSIS — I428 Other cardiomyopathies: Secondary | ICD-10-CM

## 2012-07-06 LAB — PACEMAKER DEVICE OBSERVATION
AL AMPLITUDE: 0.7 mv
ATRIAL PACING PM: 1
BRDY-0002RV: 70 {beats}/min
BRDY-0003RV: 115 {beats}/min
BRDY-0004RV: 120 {beats}/min
DEVICE MODEL PM: 7254513
VENTRICULAR PACING PM: 72

## 2012-07-06 NOTE — Patient Instructions (Addendum)
Your physician wants you to follow-up in: 6 months with device clinic You will receive a reminder letter in the mail two months in advance. If you don't receive a letter, please call our office to schedule the follow-up appointment.  

## 2012-07-07 ENCOUNTER — Telehealth: Payer: Self-pay | Admitting: *Deleted

## 2012-07-07 NOTE — Telephone Encounter (Signed)
Called and spoke with patient's son. Appointment was changed for INR check on 07/13/12 to 07/14/12 since patient has appointment with Dr Sheffield Slider that day. Also instructed to have patient take 5 mg of warfarin on the 29th.Kalev Temme, Rodena Medin -

## 2012-07-09 NOTE — Progress Notes (Signed)
ELECTROPHYSIOLOGY OFFICE NOTE  Patient ID: Misty Gray MRN: [id number], DOB/AGE: 63/09/1951   Date of Visit: 07/09/2012  Primary Physician: Tobin Chad, MD Primary Cardiologist: Johney Frame, MD Reason for Visit: Device follow-up  History of Present Illness Misty Gray is a 61 year old woman with prior NICM, sinus node dysfunction s/p PPM, dyslipidemia, prior CVA and seizure disorder who presents today for routine device follow-up. She was recently hospitalized due to dilantin toxicity. During that hospitalization, she was diagnosed with atrial fibrillation and started on anticoagulation. She was also treated for acute on chronic systolic CHF. Per Dr. Vern Claude consult note, she had not taken her cardiac medications in 3 weeks.  There was consideration given to possible TEE-guided DCCV if her rate was difficult to control or she had persistent CHF. Her rate control and SOB improved and she was discharged home on rate control with digoxin and metoprolol in addition to warfarin for embolic prophylaxis.  Today she reports she is feeling like her usual self and has no complaints. She denies chest pain, SOB, palpitations, near syncope or syncope. She denies increasing LE swelling, orthopnea or PND.  Past Medical History  Diagnosis Date  . Abnormal CT of the head 12/16/1984    R parietal atrophy  . Nonischemic cardiomyopathy   . Hypertension   . GERD (gastroesophageal reflux disease)   . Unspecified venous (peripheral) insufficiency   . Diaphragmatic hernia without mention of obstruction or gangrene   . Female stress incontinence   . Allergic rhinitis, cause unspecified   . Morbid obesity   . Lichenification and lichen simplex chronicus   . Pacemaker  st judes   . Heart murmur   . CHF (congestive heart failure)   . H/O hiatal hernia     two removed  . Anemia   . High cholesterol   . Pneumonia 2004; 2011; 11/2011  . Sleep apnea, obstructive     CPAP  . Exertional dyspnea 06/22/12  .  Migraine, unspecified, without mention of intractable migraine without mention of status migrainosus     "when I was a teenager"  . Seizures     "can hear you talking but sounds like you are in big tunnel; have them often if not taking RX; last one was 06/17/12" (06/22/12)  . Stroke 1988    "mouth drawed real bad on my left side"  . Sick sinus syndrome     WITH PRIOR DDD PACEMAKER IMPLANTATION  . Atrial fibrillation   . Syncope and collapse 06/22/12    "hit forehead and left knee"; denies loss of consciousness  . Arthritis     "hands and knees"    Past Surgical History  Procedure Date  . Leg skin lesion  biopsy / excision 05/16/2001    Lichen Planus  . Hernia repair ? 1988; 04/15/1998    ventral  . Insert / replace / remove pacemaker 12/16/1990    DDD EF 30%;  Marland Kitchen Insert / replace / remove pacemaker 07/25/2003    replaced by BB, leads are both IS1 and from 1992  . Insert / replace / remove pacemaker 05/21/2011    JA; generator change    Allergies/Intolerances Allergies  Allergen Reactions  . Aspirin Hives  . Penicillins Rash and Other (See Comments)    "doesn't do the job it's suppose to do; I get real hyperactive and I break out in a rash"   Current Home Medications Current Outpatient Prescriptions  Medication Sig Dispense Refill  . digoxin (LANOXIN) 0.125 MG  tablet Take 1 tablet (125 mcg total) by mouth daily.  30 tablet  1  . enalapril (VASOTEC) 5 MG tablet Take 0.5 tablets (2.5 mg total) by mouth 2 (two) times daily. Take 1 tablet every morning and take one-half tablet every night at bedtime.  30 tablet  0  . fluticasone (FLONASE) 50 MCG/ACT nasal spray Place 2 sprays into the nose daily as needed. For congestion.      . furosemide (LASIX) 80 MG tablet Take 1 tablet (80 mg total) by mouth 2 (two) times daily.  60 tablet  1  . metoprolol succinate (TOPROL-XL) 100 MG 24 hr tablet Take 1 tablet (100 mg total) by mouth daily. Take with or immediately following a meal.  30 tablet  1  .  omeprazole (PRILOSEC) 20 MG capsule Take 20 mg by mouth daily.      Marland Kitchen PHENobarbital (LUMINAL) 32.4 MG tablet Take 97.2 mg by mouth at bedtime.      Marland Kitchen spironolactone (ALDACTONE) 25 MG tablet Take 25 mg by mouth daily.      Marland Kitchen warfarin (COUMADIN) 5 MG tablet Take 1 tablet (5 mg total) by mouth at bedtime.  30 tablet  3   Social History Social History  . Marital Status: Married    Spouse Name: [name]    Number of Children: 4  . Years of Education: 12   Social History Main Topics  . Smoking status: Former Smoker -- 1.0 packs/day    Types: Cigarettes    Quit date: 03/16/1969  . Smokeless tobacco: Never Used  . Alcohol Use: No  . Drug Use: No  . Sexually Active: Not Currently    Birth Control/ Protection: Post-menopausal   Social History Narrative   Lives with husbandSon - [name] lives with them as do his 2 childrenDaughter, [name], and her 3 children live in [place] -  [name] is incarceratedNephew, Clelia Croft lives with themDaughter, [name], in [place]oved [place] to a better home on [address]    Review of Systems General:  No chills, fever, night sweats or weight changes Cardiovascular:  No chest pain, dyspnea on exertion, edema, orthopnea, palpitations, paroxysmal nocturnal dyspnea Dermatological: No rash, lesions or masses Respiratory: No cough, dyspnea Urologic: No hematuria, dysuria Abdominal:   No nausea, vomiting, diarrhea, bright red blood per rectum, melena, or hematemesis Neurologic:  No visual changes, weakness, changes in mental status All other systems reviewed and are otherwise negative except as noted above.  Physical Exam Blood pressure 118/64, height 5\' 2"  (1.575 m), weight 226 lb (102.513 kg).  General: Well developed, well appearing 61 year old female in no acute distress. HEENT: Normocephalic, atraumatic. EOMs intact. Sclera nonicteric. Oropharynx clear.  Neck: Supple. No JVD. Lungs:  Respirations regular  and unlabored, CTA bilaterally. No wheezes, rales or rhonchi. Heart: Irregularly irregular. S1, S2 present. No murmurs, rub, S3 or S4. Abdomen: Soft, non-distended. Extremities: No clubbing, cyanosis or edema. DP/PT/Radials 2+ and equal bilaterally. Psych: Normal affect. Neuro: Alert and oriented X 3. Moves all extremities spontaneously.   Diagnostics Recent echocardiogram 06/13/2012 - Left ventricle: There is akinesis of the entire inferior wall and akinesis of the entire inferolateral wall. The other walls are mildly hypokinestic. There is dyssynergy of the septum. These wall abnormalities are worse than the prior study (although the current images are better and there was hypokinesis of the inferior and inferolateral walls on the prior study. The cavity size was at the upper limits of normal. Wall thickness was  normal. Systolic function was severely reduced. The estimated ejection fraction was in the range of 25% to 30%. - Mitral valve: There is calcification of the some the mitral chordae. Mild to moderate regurgitation. - Left atrium: The atrium was moderately dilated. 42 mm. - Right ventricle: The cavity size was mildly dilated. Pacer wire or catheter noted in right ventricle. Systolic function was mildly reduced. - Right atrium: The atrium was mildly dilated.  Device interrogation  Normal device function. Thresholds, sensing, impedances consistent with previous measurements, stable. Device programmed to maximize longevity. Pt 100% mode switched since checked on 06-15-2012. Device programmed at appropriate safety margins. See PaceArt report.  Assessment and Plan 1. Persistent AF - asymptomatic currently and rate controlled; continue BB and digoxin for now; continue warfarin for embolic prophylaxis; ? need for DCCV as she is asymptomatic and rate controlled; her LA dimension is 42 mm - will confer with her primary EP, Dr. Johney Frame 2. Sinus node dysfunction s/p PPM - device function is normal;  will schedule her to return for device follow-up and reassessment of AF burden and rate control in 2 months unless Dr. Johney Frame prefers otherwise 3. NICM with chronic systolic CHF - appears euvolemic by exam today; continue metoprolol succinate and ACEI; of note, enalapril and spironolactone were decreased in the hospital due to hypotension and orthostasis; continue low sodium diet; counseled regarding the importance of compliance  Signed, Naraya Stoneberg, PA-C 07/09/2012, 12:08 AM

## 2012-07-13 ENCOUNTER — Ambulatory Visit: Payer: Medicare Other

## 2012-07-14 ENCOUNTER — Encounter: Payer: Self-pay | Admitting: Family Medicine

## 2012-07-14 ENCOUNTER — Ambulatory Visit (INDEPENDENT_AMBULATORY_CARE_PROVIDER_SITE_OTHER): Payer: Medicare Other | Admitting: *Deleted

## 2012-07-14 ENCOUNTER — Ambulatory Visit (INDEPENDENT_AMBULATORY_CARE_PROVIDER_SITE_OTHER): Payer: Medicare Other | Admitting: Family Medicine

## 2012-07-14 VITALS — BP 128/78 | HR 74 | Ht 62.0 in | Wt 227.0 lb

## 2012-07-14 DIAGNOSIS — R5381 Other malaise: Secondary | ICD-10-CM

## 2012-07-14 DIAGNOSIS — Z5181 Encounter for therapeutic drug level monitoring: Secondary | ICD-10-CM

## 2012-07-14 DIAGNOSIS — I4891 Unspecified atrial fibrillation: Secondary | ICD-10-CM

## 2012-07-14 DIAGNOSIS — R531 Weakness: Secondary | ICD-10-CM

## 2012-07-14 DIAGNOSIS — I5022 Chronic systolic (congestive) heart failure: Secondary | ICD-10-CM

## 2012-07-14 DIAGNOSIS — Z7901 Long term (current) use of anticoagulants: Secondary | ICD-10-CM

## 2012-07-14 DIAGNOSIS — E559 Vitamin D deficiency, unspecified: Secondary | ICD-10-CM

## 2012-07-14 DIAGNOSIS — R0602 Shortness of breath: Secondary | ICD-10-CM

## 2012-07-14 DIAGNOSIS — R5383 Other fatigue: Secondary | ICD-10-CM

## 2012-07-14 LAB — BASIC METABOLIC PANEL
CO2: 31 mEq/L (ref 19–32)
Calcium: 9.5 mg/dL (ref 8.4–10.5)
Chloride: 98 mEq/L (ref 96–112)
Creat: 0.78 mg/dL (ref 0.50–1.10)
Sodium: 138 mEq/L (ref 135–145)

## 2012-07-14 LAB — POCT INR: INR: 1.2

## 2012-07-14 NOTE — Patient Instructions (Addendum)
I will call with your blood test results  Return in one week for blood thinner test  Please return to see Dr Sheffield Slider in 2 weeks

## 2012-07-15 ENCOUNTER — Telehealth: Payer: Self-pay | Admitting: Family Medicine

## 2012-07-15 DIAGNOSIS — E559 Vitamin D deficiency, unspecified: Secondary | ICD-10-CM | POA: Insufficient documentation

## 2012-07-15 LAB — VITAMIN D 25 HYDROXY (VIT D DEFICIENCY, FRACTURES): Vit D, 25-Hydroxy: 14 ng/mL — ABNORMAL LOW (ref 30–89)

## 2012-07-15 LAB — BRAIN NATRIURETIC PEPTIDE: Brain Natriuretic Peptide: 259.7 pg/mL — ABNORMAL HIGH (ref 0.0–100.0)

## 2012-07-15 MED ORDER — VITAMIN D3 1.25 MG (50000 UT) PO CAPS: 1.0000 | ORAL_CAPSULE | ORAL | Status: DC

## 2012-07-15 NOTE — Telephone Encounter (Signed)
I spoke with her by phone to tell her that her potassium is normal, and that I sent in Vitamin D for her to take.

## 2012-07-15 NOTE — Assessment & Plan Note (Signed)
Clinically stable, BNP back to near normal range. No low potassium to explain leg cramps.

## 2012-07-15 NOTE — Progress Notes (Signed)
  Subjective:    Patient ID: Misty Gray, female    DOB: 1951-06-19, 61 y.o.   MRN: [id number]  HPI  Weakness - She remains weak and feels light headed with standing. Muscle cramps in her calves the past few days. Using a walker. Home PT hasn't started yet.   CHF - dyspneic going up hills, but is getting out of the house. Edema in her ankles is much less.  Seizures - non so far off the Phenytoin. Ataxia has resolved  Sleep apnea - she hasn't had her CPAP replaced yet, [facility] is working on it.   Obesity - Appetite remains decreased    Review of Systems     Objective:   Physical Exam  Constitutional:       Generalized obesity  Cardiovascular: Normal rate.   No murmur heard.      Distant heart sounds. Irregularly irregular  Pulmonary/Chest: Effort normal and breath sounds normal.  Musculoskeletal: She exhibits edema.       Trace ankle edema           Assessment & Plan:

## 2012-07-15 NOTE — Assessment & Plan Note (Signed)
Primarily disuse atrophy in a patient with low conditioning level. Her vitamin D is low so will replace that to help muscle strength.

## 2012-07-15 NOTE — Assessment & Plan Note (Signed)
Rate controlled 

## 2012-07-21 ENCOUNTER — Ambulatory Visit (INDEPENDENT_AMBULATORY_CARE_PROVIDER_SITE_OTHER): Payer: Medicare Other | Admitting: *Deleted

## 2012-07-21 DIAGNOSIS — Z5181 Encounter for therapeutic drug level monitoring: Secondary | ICD-10-CM

## 2012-07-21 DIAGNOSIS — Z7901 Long term (current) use of anticoagulants: Secondary | ICD-10-CM

## 2012-07-27 ENCOUNTER — Telehealth: Payer: Self-pay | Admitting: Family Medicine

## 2012-07-27 ENCOUNTER — Encounter (INDEPENDENT_AMBULATORY_CARE_PROVIDER_SITE_OTHER): Payer: Medicare Other | Admitting: Family Medicine

## 2012-07-27 ENCOUNTER — Other Ambulatory Visit: Payer: Self-pay | Admitting: Family Medicine

## 2012-07-27 ENCOUNTER — Ambulatory Visit (INDEPENDENT_AMBULATORY_CARE_PROVIDER_SITE_OTHER): Payer: Medicare Other | Admitting: *Deleted

## 2012-07-27 DIAGNOSIS — Z0279 Encounter for issue of other medical certificate: Secondary | ICD-10-CM

## 2012-07-27 DIAGNOSIS — Z5181 Encounter for therapeutic drug level monitoring: Secondary | ICD-10-CM

## 2012-07-27 DIAGNOSIS — R531 Weakness: Secondary | ICD-10-CM

## 2012-07-27 DIAGNOSIS — Z7901 Long term (current) use of anticoagulants: Secondary | ICD-10-CM

## 2012-07-27 LAB — POCT INR: INR: 1.7

## 2012-07-27 NOTE — Telephone Encounter (Signed)
485 form [employer] certification form completed to be faxed back to [facility]

## 2012-07-27 NOTE — Assessment & Plan Note (Signed)
F2F form siged and returned to [facility]

## 2012-07-27 NOTE — Progress Notes (Signed)
Patient ID: Misty Gray, female   DOB: [birth year], 61 y.o.   MRN: [id number] 485 form reviewed and signed.  Diagnosis weakness.

## 2012-08-03 ENCOUNTER — Other Ambulatory Visit (HOSPITAL_COMMUNITY): Payer: Self-pay | Admitting: Emergency Medicine

## 2012-08-03 ENCOUNTER — Ambulatory Visit (INDEPENDENT_AMBULATORY_CARE_PROVIDER_SITE_OTHER): Payer: Medicare Other | Admitting: *Deleted

## 2012-08-03 ENCOUNTER — Other Ambulatory Visit: Payer: Self-pay | Admitting: Family Medicine

## 2012-08-03 DIAGNOSIS — Z5181 Encounter for therapeutic drug level monitoring: Secondary | ICD-10-CM

## 2012-08-03 DIAGNOSIS — Z7901 Long term (current) use of anticoagulants: Secondary | ICD-10-CM

## 2012-08-03 DIAGNOSIS — I4891 Unspecified atrial fibrillation: Secondary | ICD-10-CM

## 2012-08-03 LAB — POCT INR: INR: 3.1

## 2012-08-03 MED ORDER — WARFARIN SODIUM 5 MG PO TABS: ORAL_TABLET | ORAL | Status: DC

## 2012-08-16 ENCOUNTER — Other Ambulatory Visit (HOSPITAL_COMMUNITY): Payer: Self-pay | Admitting: Family Medicine

## 2012-08-18 ENCOUNTER — Ambulatory Visit (INDEPENDENT_AMBULATORY_CARE_PROVIDER_SITE_OTHER): Payer: Medicare Other | Admitting: *Deleted

## 2012-08-18 ENCOUNTER — Other Ambulatory Visit: Payer: Self-pay | Admitting: Family Medicine

## 2012-08-18 DIAGNOSIS — R569 Unspecified convulsions: Secondary | ICD-10-CM

## 2012-08-18 DIAGNOSIS — Z7901 Long term (current) use of anticoagulants: Secondary | ICD-10-CM

## 2012-08-18 DIAGNOSIS — Z5181 Encounter for therapeutic drug level monitoring: Secondary | ICD-10-CM

## 2012-08-18 DIAGNOSIS — I4891 Unspecified atrial fibrillation: Secondary | ICD-10-CM

## 2012-08-18 LAB — POCT INR: INR: 2.3

## 2012-08-18 MED ORDER — WARFARIN SODIUM 5 MG PO TABS: ORAL_TABLET | ORAL | Status: DC

## 2012-08-18 NOTE — Progress Notes (Signed)
Pt had seizure on Sat.  Discussed with Dr. Deirdre Priest, will draw a phenobarb level and schedule ov for this week.

## 2012-08-19 LAB — PHENOBARBITAL LEVEL: Phenobarbital: 14.6 ug/mL — ABNORMAL LOW (ref 15.0–40.0)

## 2012-08-20 ENCOUNTER — Ambulatory Visit (INDEPENDENT_AMBULATORY_CARE_PROVIDER_SITE_OTHER): Payer: Medicare Other | Admitting: Family Medicine

## 2012-08-20 ENCOUNTER — Encounter: Payer: Self-pay | Admitting: Family Medicine

## 2012-08-20 VITALS — BP 103/54 | HR 122 | Temp 98.2°F | Ht 62.0 in | Wt 224.0 lb

## 2012-08-20 DIAGNOSIS — R531 Weakness: Secondary | ICD-10-CM

## 2012-08-20 DIAGNOSIS — R5381 Other malaise: Secondary | ICD-10-CM

## 2012-08-20 DIAGNOSIS — R569 Unspecified convulsions: Secondary | ICD-10-CM

## 2012-08-20 DIAGNOSIS — R5383 Other fatigue: Secondary | ICD-10-CM

## 2012-08-20 MED ORDER — FLUTICASONE PROPIONATE 50 MCG/ACT NA SUSP: 2.0000 | Freq: Every day | NASAL | Status: DC | PRN

## 2012-08-20 NOTE — Progress Notes (Signed)
Patient ID: [name], female   DOB: 01-Apr-1951, 61 y.o.   MRN: [id number] Misty Gray is a 61 y.o. female who presents to [name] today for follow-up for seizures:  1.  Seizure:  Had seizure on Saturday (5 days ago).  Son, who is present today, witnesses seizure.  Described symptoms typical of prior seizures.  Numbness and tingling started on Left side of body both arm and leg and traveled to encompass both sides of body.  She describes sensation of "being in a tunnel with everyone being far away and hard to hear."  States she tried to answer her son and nephew who was also present but had much difficulty.  Son described slowed, slurred speech.  No facial droop.  Episode lasted about 10 minutes and then resolved.  She was very tired and somewhat confused afterwards.  Had been out in the sun for a family member's birthday party but after her episode she had to leave the party to go home and sleep.  No further weakness, numbness, tingling, difficultlies in speech since that time.    She was recently hospitalized and taken off her Phenytoin -- she had been on combination of Phenytoin and Phenobarbital prior to that and states her seizures were under control.  However she experienced altered mental status and fall that was deemed to be secondary to overmedication and therefore phenytoin was stopped.    2.  Weakness:  Much improved.  Has been working with PT and has been taught how to use her legs in a more efficient matter per her.  Feels stronger.  Feels that this mostly improved since she was taken off of Phenytoin.    The following portions of the patient's history were reviewed and updated as appropriate: allergies, current medications, past medical history, family and social history, and problem list.  Patient is a nonsmoker.  Past Medical History  Diagnosis Date  . Abnormal CT of the head 12/16/1984    R parietal atrophy  . Nonischemic cardiomyopathy     EF has normalized-repeat Pending   .  Hypertension   . GERD (gastroesophageal reflux disease)   . Unspecified venous (peripheral) insufficiency   . Diaphragmatic hernia without mention of obstruction or gangrene   . Female stress incontinence   . Allergic rhinitis, cause unspecified   . Morbid obesity   . Lichenification and lichen simplex chronicus   . Pacemaker  st judes   . Heart murmur   . CHF (congestive heart failure)   . H/O hiatal hernia     two removed  . Anemia   . High cholesterol   . Pneumonia 2004; 2011; 11/2011  . Sleep apnea, obstructive     CPAP  . Exertional dyspnea 06/22/12  . Migraine, unspecified, without mention of intractable migraine without mention of status migrainosus     "when I was a teenager"  . Seizures     "can hear you talking but sounds like you are in big tunnel; have them often if not taking RX; last one was 06/17/12" (06/22/12)  . Stroke 1988    "mouth drawed real bad on my left side"  . Sick sinus syndrome     WITH PRIOR DDD PACEMAKER IMPLANTATION  . Atrial fibrillation   . Syncope and collapse 06/22/12    "hit forehead and left knee"; denies loss of consciousness  . Arthritis     "hands and knees"    ROS as above otherwise neg. No Chest pain, palpitations, SOB,  Fever, Chills, Abd pain, N/V/D.  Medications reviewed. Current Outpatient Prescriptions  Medication Sig Dispense Refill  . Cholecalciferol (VITAMIN D3) 50000 UNITS CAPS Take 1 capsule by mouth once a week.  8 capsule  0  . digoxin (LANOXIN) 0.125 MG tablet Take 1 tablet (125 mcg total) by mouth daily.  30 tablet  1  . enalapril (VASOTEC) 5 MG tablet Take 0.5 tablets (2.5 mg total) by mouth 2 (two) times daily. Take 1 tablet every morning and take one-half tablet every night at bedtime.  30 tablet  0  . fluticasone (FLONASE) 50 MCG/ACT nasal spray Place 2 sprays into the nose daily as needed. For congestion.      . furosemide (LASIX) 80 MG tablet TAKE 1 TABLET (80 MG TOTAL) BY MOUTH 2 (TWO) TIMES DAILY.  60 tablet  1  .  metoprolol succinate (TOPROL-XL) 100 MG 24 hr tablet Take 1 tablet (100 mg total) by mouth daily. Take with or immediately following a meal.  30 tablet  1  . omeprazole (PRILOSEC) 20 MG capsule Take 20 mg by mouth daily.      Marland Kitchen PHENobarbital (LUMINAL) 32.4 MG tablet Take 97.2 mg by mouth at bedtime.      Marland Kitchen spironolactone (ALDACTONE) 25 MG tablet Take 25 mg by mouth daily.      Marland Kitchen warfarin (COUMADIN) 5 MG tablet PLEASE TAKE AS DIRECTED BY COUMADIN CLINIC  30 tablet  1  . warfarin (COUMADIN) 5 MG tablet Take 2 tablets (10mg ) six days a week, and 3 tablets (15mg ) one day a week, according to instructions given by physician office.  60 tablet  1    Exam:  BP 103/54  Pulse 122  Temp 98.2 F (36.8 C) (Oral)  Ht 5\' 2"  (1.575 m)  Wt 224 lb (101.606 kg)  BMI 40.97 kg/m2 Gen:  Alert, cooperative patient who appears stated age in no acute distress.  Vital signs reviewed.  Kyphotic posture HEENT:  [place]/AT.  EOMI, PERRL.  MMM, tonsils non-erythematous, non-edematous.  External ears WNL, Bilateral TM's normal without retraction, redness or bulging.  Cardiac:  Regular rate and rhythm without murmur auscultated.  Good S1/S2. Pulm:  Clear to auscultation bilaterally with good air movement.  No wheezes or rales noted.   Exts: Non edematous BL  LE, warm and well perfused.  Neuro:  Neuro:  Alert and oriented to person, place, and date.  CN II-XII intact.  Sensation intact to light touch, pain, and vibration bilateral upper and lower extremities equally.  Motor function equal and strength 5/5 bilateral upper and lower extremities.  Normal gait.  Finger to nose cerebellar testing within normal limits.     Results for orders placed in visit on 08/18/12 (from the past 72 hour(s))  POCT INR     Status: Normal      Component Value Range Comment   INR 2.3     PHENOBARBITAL LEVEL     Status: Abnormal   Collection Time   08/18/12  4:08 PM      Component Value Range Comment   Phenobarbital 14.6 (*) 15.0 - 40.0 ug/mL

## 2012-08-20 NOTE — Progress Notes (Deleted)
Subjective:     Patient ID: Misty Gray, female   DOB: 09/21/1951, 61 y.o.   MRN: [id number]  HPI   Review of Systems     Objective:   Physical Exam     Assessment:     ***    Plan:     ***

## 2012-08-20 NOTE — Patient Instructions (Addendum)
Make an appointment for next Friday for labs.  Take 1 pill of the Phenobarb in the AM and 3 at night.  It was good to see you.

## 2012-08-21 NOTE — Assessment & Plan Note (Signed)
Improved since stopping Phenytoin and since working with PT.  To continue twice weekly PT sessions.

## 2012-08-21 NOTE — Assessment & Plan Note (Signed)
Seems to be typical seizure activity for patient.   She was sub-therapeutic on recent Phenobarb level drawn after her seizure. In past, she and son (her primary caregiver) state that she was previously on much higher dose of phenobarb (2 pills in AM and 3 in PM) and now she is just taking 3 in PM. Decided to increase to 1 tablet in AM and to continue with her nightly dose, this would be total of 129.6 mg Phenobarb. Discussed signs of toxicity with patient and son, including grogginess, dizziness, confusion, somnolence. Will need to recheck Phenobarb levels in 1 week due to this increase in dose.

## 2012-08-28 ENCOUNTER — Ambulatory Visit (INDEPENDENT_AMBULATORY_CARE_PROVIDER_SITE_OTHER): Payer: Medicare Other | Admitting: *Deleted

## 2012-08-28 DIAGNOSIS — R569 Unspecified convulsions: Secondary | ICD-10-CM

## 2012-08-28 DIAGNOSIS — Z7901 Long term (current) use of anticoagulants: Secondary | ICD-10-CM

## 2012-08-28 DIAGNOSIS — Z5181 Encounter for therapeutic drug level monitoring: Secondary | ICD-10-CM

## 2012-08-28 LAB — POCT INR: INR: 2.2

## 2012-08-29 LAB — PHENOBARBITAL LEVEL: Phenobarbital: 22 ug/mL (ref 15.0–40.0)

## 2012-08-31 ENCOUNTER — Ambulatory Visit: Payer: Medicare Other

## 2012-09-01 ENCOUNTER — Other Ambulatory Visit: Payer: Self-pay | Admitting: Family Medicine

## 2012-09-08 ENCOUNTER — Ambulatory Visit: Payer: Medicare Other

## 2012-09-08 ENCOUNTER — Encounter: Payer: Medicare Other | Admitting: Family Medicine

## 2012-09-09 ENCOUNTER — Other Ambulatory Visit: Payer: Self-pay | Admitting: Family Medicine

## 2012-09-09 DIAGNOSIS — Z1231 Encounter for screening mammogram for malignant neoplasm of breast: Secondary | ICD-10-CM

## 2012-09-09 NOTE — Progress Notes (Signed)
She didn't show for the appointment. I tried to call her with her phenobarbital level, which was slightly low, but her voice mailbox isn't set up

## 2012-09-09 NOTE — Progress Notes (Signed)
This encounter was created in error - please disregard.

## 2012-09-15 ENCOUNTER — Ambulatory Visit (INDEPENDENT_AMBULATORY_CARE_PROVIDER_SITE_OTHER): Payer: Medicare Other | Admitting: *Deleted

## 2012-09-15 ENCOUNTER — Encounter: Payer: Self-pay | Admitting: Family Medicine

## 2012-09-15 ENCOUNTER — Ambulatory Visit (INDEPENDENT_AMBULATORY_CARE_PROVIDER_SITE_OTHER): Payer: Medicare Other | Admitting: Family Medicine

## 2012-09-15 VITALS — BP 88/68 | HR 72 | Ht 62.0 in | Wt 225.7 lb

## 2012-09-15 DIAGNOSIS — I4891 Unspecified atrial fibrillation: Secondary | ICD-10-CM

## 2012-09-15 DIAGNOSIS — E559 Vitamin D deficiency, unspecified: Secondary | ICD-10-CM

## 2012-09-15 DIAGNOSIS — Z23 Encounter for immunization: Secondary | ICD-10-CM

## 2012-09-15 DIAGNOSIS — Z5181 Encounter for therapeutic drug level monitoring: Secondary | ICD-10-CM

## 2012-09-15 DIAGNOSIS — Z7901 Long term (current) use of anticoagulants: Secondary | ICD-10-CM

## 2012-09-15 DIAGNOSIS — R569 Unspecified convulsions: Secondary | ICD-10-CM

## 2012-09-15 DIAGNOSIS — I5022 Chronic systolic (congestive) heart failure: Secondary | ICD-10-CM

## 2012-09-15 MED ORDER — ENALAPRIL MALEATE 5 MG PO TABS: 2.5000 mg | ORAL_TABLET | Freq: Two times a day (BID) | ORAL | Status: DC

## 2012-09-15 NOTE — Assessment & Plan Note (Signed)
Rate controlled 

## 2012-09-15 NOTE — Assessment & Plan Note (Signed)
Phenobarbital level was therapeutic. Consider second medication if more frequent seizures

## 2012-09-15 NOTE — Assessment & Plan Note (Signed)
Continue 50 K vitamin D monthly or 2000 IU daily if cheaper

## 2012-09-15 NOTE — Patient Instructions (Addendum)
Decrease the Enalapril to 5 mg 1/2 tab twice daily  You could take Vitamin D3 2000 IU  Daily in place of the 50000 IU once monthly.   Please return to see Dr Sheffield Slider in 1 month  Let me know if you continue to be light headed

## 2012-09-15 NOTE — Assessment & Plan Note (Signed)
Hypotension - due to excess vasodilatation rather than overdiuresis. Will decrease Enalapril

## 2012-09-15 NOTE — Progress Notes (Signed)
  Subjective:    Patient ID: Misty Gray, female    DOB: 08-Nov-1951, 61 y.o.   MRN: [id number]  HPI Seizure - One a week ago in church. Brief without fall. Some spinning feeling with that episode.  Lightheaded - intermittently, usually when stands up  Dyspnea - Chronically on exertion, but no worse. Can't go down the steps into the basement   Review of Systems     Objective:   Physical Exam  Constitutional:       Generalized obesity   Cardiovascular: Normal rate.        Irregularly irregular  Pulmonary/Chest: Effort normal and breath sounds normal. No respiratory distress. She has no wheezes. She has no rales. She exhibits no tenderness.       Pacemaker above left breast  Musculoskeletal: She exhibits edema.       2+ ankles  Neurological: She is alert.  Skin:       3x4 cm area of raised erythema left ankle which is chronic  Psychiatric: She has a normal mood and affect. Her behavior is normal. Thought content normal.          Assessment & Plan:

## 2012-09-21 ENCOUNTER — Encounter: Payer: Medicare Other | Admitting: Internal Medicine

## 2012-09-29 ENCOUNTER — Encounter: Payer: Self-pay | Admitting: Internal Medicine

## 2012-10-07 ENCOUNTER — Encounter: Payer: Self-pay | Admitting: Internal Medicine

## 2012-10-07 ENCOUNTER — Ambulatory Visit (INDEPENDENT_AMBULATORY_CARE_PROVIDER_SITE_OTHER): Payer: Medicare Other | Admitting: Internal Medicine

## 2012-10-07 VITALS — BP 110/60 | HR 77 | Wt 223.0 lb

## 2012-10-07 DIAGNOSIS — Z950 Presence of cardiac pacemaker: Secondary | ICD-10-CM

## 2012-10-07 DIAGNOSIS — I4891 Unspecified atrial fibrillation: Secondary | ICD-10-CM

## 2012-10-07 DIAGNOSIS — I495 Sick sinus syndrome: Secondary | ICD-10-CM

## 2012-10-07 LAB — PACEMAKER DEVICE OBSERVATION
AL IMPEDENCE PM: 462.5 Ohm
ATRIAL PACING PM: 2.8
BRDY-0004RV: 120 {beats}/min
DEVICE MODEL PM: 7254513
RV LEAD IMPEDENCE PM: 462.5 Ohm
VENTRICULAR PACING PM: 73

## 2012-10-07 NOTE — Patient Instructions (Signed)
Your physician wants you to follow-up in: 6 months in the device clinic and 12 months with Dr Allred You will receive a reminder letter in the mail two months in advance. If you don't receive a letter, please call our office to schedule the follow-up appointment.  

## 2012-10-07 NOTE — Progress Notes (Signed)
PCP: Tobin Chad, MD  Misty Gray is a 61 y.o. female who presents today for routine electrophysiology followup.  Since last being seen in our clinic, the patient reports doing well.   She remains active.  She denies symptoms with her afib.  Today, she denies symptoms of palpitations, chest pain, shortness of breath,  lower extremity edema, dizziness, presyncope, or syncope.  The patient is otherwise without complaint today.   Past Medical History  Diagnosis Date  . Abnormal CT of the head 12/16/1984    R parietal atrophy  . Nonischemic cardiomyopathy     EF has normalized-repeat Pending   . Hypertension   . GERD (gastroesophageal reflux disease)   . Unspecified venous (peripheral) insufficiency   . Diaphragmatic hernia without mention of obstruction or gangrene   . Female stress incontinence   . Allergic rhinitis, cause unspecified   . Morbid obesity   . Lichenification and lichen simplex chronicus   . Pacemaker  st judes   . Heart murmur   . CHF (congestive heart failure)   . H/O hiatal hernia     two removed  . Anemia   . High cholesterol   . Pneumonia 2004; 2011; 11/2011  . Sleep apnea, obstructive     CPAP  . Exertional dyspnea 06/22/12  . Migraine, unspecified, without mention of intractable migraine without mention of status migrainosus     "when I was a teenager"  . Seizures     "can hear you talking but sounds like you are in big tunnel; have them often if not taking RX; last one was 06/17/12" (06/22/12)  . Stroke 1988    "mouth drawed real bad on my left side"  . Sick sinus syndrome     WITH PRIOR DDD PACEMAKER IMPLANTATION  . Atrial fibrillation   . Syncope and collapse 06/22/12    "hit forehead and left knee"; denies loss of consciousness  . Arthritis     "hands and knees"   Past Surgical History  Procedure Date  . Leg skin lesion  biopsy / excision 05/16/2001    Lichen Planus  . Hernia repair ? 1988; 04/15/1998    ventral  . Insert / replace / remove pacemaker  12/16/1990    DDD EF 30%;  Marland Kitchen Insert / replace / remove pacemaker 07/25/2003    replaced by BB, leads are both IS1 and from 1992  . Insert / replace / remove pacemaker 05/21/2011    JA; generator change    Current Outpatient Prescriptions  Medication Sig Dispense Refill  . digoxin (LANOXIN) 0.125 MG tablet Take 1 tablet (125 mcg total) by mouth daily.  30 tablet  1  . enalapril (VASOTEC) 5 MG tablet Take 0.5 tablets (2.5 mg total) by mouth 2 (two) times daily.  30 tablet  11  . fluticasone (FLONASE) 50 MCG/ACT nasal spray Place 2 sprays into the nose daily as needed. For congestion.  16 g  1  . furosemide (LASIX) 80 MG tablet TAKE 1 TABLET (80 MG TOTAL) BY MOUTH 2 (TWO) TIMES DAILY.  60 tablet  1  . halobetasol (ULTRAVATE) 0.05 % cream Apply topically 2 (two) times daily.      . metoprolol succinate (TOPROL-XL) 100 MG 24 hr tablet Take 1 tablet (100 mg total) by mouth daily. Take with or immediately following a meal.  30 tablet  1  . omeprazole (PRILOSEC) 20 MG capsule Take 20 mg by mouth daily.      Marland Kitchen PHENobarbital (LUMINAL) 32.4  MG tablet Take 97.2 mg by mouth at bedtime.      Marland Kitchen spironolactone (ALDACTONE) 25 MG tablet Take 25 mg by mouth daily.      . Vitamin D, Ergocalciferol, (DRISDOL) 50000 UNITS CAPS Take 1 capsule (50,000 Units total) by mouth every 30 (thirty) days.  3 capsule  3  . warfarin (COUMADIN) 5 MG tablet PLEASE TAKE AS DIRECTED BY COUMADIN CLINIC  60 tablet  1    Physical Exam: Filed Vitals:   10/07/12 1105  BP: 110/60  Pulse: 77  Weight: 223 lb (101.152 kg)    GEN- The patient is well appearing, alert and oriented x 3 today.   Head- normocephalic, atraumatic Eyes-  Sclera clear, conjunctiva pink Ears- hearing intact Oropharynx- clear Lungs- Clear to ausculation bilaterally, normal work of breathing Chest- pacemaker pocket is well healed Heart- irregular rate and rhythm, no murmurs, rubs or gallops, PMI not laterally displaced GI- soft, NT, ND, + BS Extremities- no  clubbing, cyanosis, or edema  Pacemaker interrogation- reviewed in detail today,  See PACEART report  Assessment and Plan:  1. Bradycardia Normal pacemaker function See Pace Art report No changes today  2 afib She has asymptomatic afib I have offered cardioversion.  She is clear that she does not wish to proceed with cardioversion.  Given her absence of symptoms, this is reasonable.  We will therefore continue rate control long term.

## 2012-10-08 ENCOUNTER — Ambulatory Visit
Admission: RE | Admit: 2012-10-08 | Discharge: 2012-10-08 | Disposition: A | Discharge status: Home / Self Care | Payer: Medicare Other | Source: Ambulatory Visit | Attending: Family Medicine | Admitting: Family Medicine

## 2012-10-08 DIAGNOSIS — Z1231 Encounter for screening mammogram for malignant neoplasm of breast: Secondary | ICD-10-CM

## 2012-10-13 ENCOUNTER — Ambulatory Visit (INDEPENDENT_AMBULATORY_CARE_PROVIDER_SITE_OTHER): Payer: Medicare Other | Admitting: Family Medicine

## 2012-10-13 ENCOUNTER — Ambulatory Visit (INDEPENDENT_AMBULATORY_CARE_PROVIDER_SITE_OTHER): Payer: Medicare Other | Admitting: *Deleted

## 2012-10-13 ENCOUNTER — Encounter: Payer: Self-pay | Admitting: Family Medicine

## 2012-10-13 VITALS — BP 93/65 | HR 67 | Wt 221.0 lb

## 2012-10-13 DIAGNOSIS — I5022 Chronic systolic (congestive) heart failure: Secondary | ICD-10-CM

## 2012-10-13 DIAGNOSIS — R569 Unspecified convulsions: Secondary | ICD-10-CM

## 2012-10-13 DIAGNOSIS — Z7901 Long term (current) use of anticoagulants: Secondary | ICD-10-CM

## 2012-10-13 DIAGNOSIS — R531 Weakness: Secondary | ICD-10-CM

## 2012-10-13 DIAGNOSIS — L280 Lichen simplex chronicus: Secondary | ICD-10-CM

## 2012-10-13 DIAGNOSIS — R5381 Other malaise: Secondary | ICD-10-CM

## 2012-10-13 DIAGNOSIS — I4891 Unspecified atrial fibrillation: Secondary | ICD-10-CM

## 2012-10-13 DIAGNOSIS — E559 Vitamin D deficiency, unspecified: Secondary | ICD-10-CM

## 2012-10-13 DIAGNOSIS — R5383 Other fatigue: Secondary | ICD-10-CM

## 2012-10-13 DIAGNOSIS — Z5181 Encounter for therapeutic drug level monitoring: Secondary | ICD-10-CM

## 2012-10-13 LAB — COMPREHENSIVE METABOLIC PANEL
ALT: 19 U/L (ref 0–35)
AST: 22 U/L (ref 0–37)
Albumin: 3.8 g/dL (ref 3.5–5.2)
CO2: 32 mEq/L (ref 19–32)
Calcium: 9.6 mg/dL (ref 8.4–10.5)
Chloride: 100 mEq/L (ref 96–112)
Creat: 0.78 mg/dL (ref 0.50–1.10)
Potassium: 4 mEq/L (ref 3.5–5.3)

## 2012-10-13 LAB — POCT INR: INR: 2.2

## 2012-10-13 NOTE — Progress Notes (Signed)
  Subjective:    Patient ID: Misty Gray, female    DOB: [birth year], 61 y.o.   MRN: [id number]  HPIhere with her son  Cardiac - Dr. Johney Frame who did not change her medications. Can walk up and down steps but gets short of breath going up. Can walk 3 blocks to the store and walks about one block with the dog.  Not lightheaded  Obesity - he is trying to cut back on her calories to the cardiologist told her she needed to lose weight  Advance directives - she brings in a form for a living will notarized in the hospital. Says her husband would speak first and her son second she couldn't express her wishes. Her husband is going in for a heart catheterization tomorrow. We'll give her the complete form to have the full advanced directives.   Seizures - none recently  Review of Systems  see HPI for ROS     Objective:   Physical Exam  Constitutional:       Generalized obesity   Cardiovascular: Normal rate and regular rhythm.   Pulmonary/Chest: Effort normal and breath sounds normal.  Musculoskeletal: She exhibits edema.       Trace ankle edema bilaterally  Skin:       Her left shin dermatitis is minimally red currently          Assessment & Plan:

## 2012-10-13 NOTE — Patient Instructions (Addendum)
I will call you with your lab results to adjust your blood pressure medicine  Please return to see Dr Sheffield Slider in 1 month  Keep exercising, You are doing great.!

## 2012-10-14 ENCOUNTER — Encounter: Payer: Self-pay | Admitting: Family Medicine

## 2012-10-14 LAB — DIGOXIN LEVEL: Digoxin Level: 0.6 ng/mL — ABNORMAL LOW (ref 0.8–2.0)

## 2012-10-14 MED ORDER — VITAMIN D3 125 MCG (5000 UT) PO CHEW: 1.0000 | CHEWABLE_TABLET | ORAL | Status: DC

## 2012-10-14 MED ORDER — FUROSEMIDE 80 MG PO TABS: ORAL_TABLET | ORAL | Status: DC

## 2012-10-14 NOTE — Assessment & Plan Note (Signed)
Controlled, will decrease furosemide due to low blood pressure, though it's asymptomatic

## 2012-10-14 NOTE — Assessment & Plan Note (Signed)
Take the 5000 IU tabs twice weekly for maintenance.

## 2012-10-14 NOTE — Assessment & Plan Note (Signed)
Non recently

## 2012-10-14 NOTE — Assessment & Plan Note (Signed)
Improving with exercise 

## 2012-10-14 NOTE — Assessment & Plan Note (Signed)
Improving, partially due to diuresis

## 2012-10-17 ENCOUNTER — Other Ambulatory Visit: Payer: Self-pay | Admitting: Family Medicine

## 2012-10-27 ENCOUNTER — Other Ambulatory Visit: Payer: Self-pay | Admitting: Family Medicine

## 2012-11-09 ENCOUNTER — Other Ambulatory Visit: Payer: Self-pay | Admitting: Family Medicine

## 2012-11-10 ENCOUNTER — Ambulatory Visit (INDEPENDENT_AMBULATORY_CARE_PROVIDER_SITE_OTHER): Payer: Medicare Other | Admitting: *Deleted

## 2012-11-10 ENCOUNTER — Other Ambulatory Visit: Payer: Self-pay | Admitting: *Deleted

## 2012-11-10 DIAGNOSIS — Z5181 Encounter for therapeutic drug level monitoring: Secondary | ICD-10-CM

## 2012-11-10 DIAGNOSIS — Z7901 Long term (current) use of anticoagulants: Secondary | ICD-10-CM

## 2012-11-10 MED ORDER — SPIRONOLACTONE 25 MG PO TABS: 25.0000 mg | ORAL_TABLET | Freq: Every day | ORAL | Status: DC

## 2012-11-23 ENCOUNTER — Other Ambulatory Visit (HOSPITAL_COMMUNITY): Payer: Self-pay | Admitting: Family Medicine

## 2012-12-07 ENCOUNTER — Ambulatory Visit (INDEPENDENT_AMBULATORY_CARE_PROVIDER_SITE_OTHER): Payer: Medicare Other | Admitting: *Deleted

## 2012-12-07 DIAGNOSIS — Z5181 Encounter for therapeutic drug level monitoring: Secondary | ICD-10-CM

## 2012-12-07 DIAGNOSIS — Z7901 Long term (current) use of anticoagulants: Secondary | ICD-10-CM

## 2012-12-21 ENCOUNTER — Other Ambulatory Visit: Payer: Self-pay | Admitting: *Deleted

## 2012-12-21 MED ORDER — DIGOXIN 125 MCG PO TABS: 125.0000 ug | ORAL_TABLET | Freq: Every day | ORAL | Status: DC

## 2012-12-29 ENCOUNTER — Other Ambulatory Visit: Payer: Self-pay | Admitting: *Deleted

## 2012-12-29 MED ORDER — METOPROLOL SUCCINATE ER 100 MG PO TB24: 100.0000 mg | ORAL_TABLET | Freq: Every day | ORAL | Status: DC

## 2013-01-04 ENCOUNTER — Ambulatory Visit (INDEPENDENT_AMBULATORY_CARE_PROVIDER_SITE_OTHER): Payer: Medicare Other | Admitting: Family Medicine

## 2013-01-04 ENCOUNTER — Ambulatory Visit (INDEPENDENT_AMBULATORY_CARE_PROVIDER_SITE_OTHER): Payer: Medicare Other | Admitting: *Deleted

## 2013-01-04 ENCOUNTER — Encounter: Payer: Self-pay | Admitting: Family Medicine

## 2013-01-04 VITALS — BP 106/64 | HR 70 | Temp 98.4°F | Ht 62.0 in | Wt 207.0 lb

## 2013-01-04 DIAGNOSIS — Z7901 Long term (current) use of anticoagulants: Secondary | ICD-10-CM

## 2013-01-04 DIAGNOSIS — Z5181 Encounter for therapeutic drug level monitoring: Secondary | ICD-10-CM

## 2013-01-04 DIAGNOSIS — R569 Unspecified convulsions: Secondary | ICD-10-CM

## 2013-01-04 DIAGNOSIS — J329 Chronic sinusitis, unspecified: Secondary | ICD-10-CM

## 2013-01-04 LAB — POCT INR: INR: 2.6

## 2013-01-04 MED ORDER — DOXYCYCLINE HYCLATE 100 MG PO TABS: 100.0000 mg | ORAL_TABLET | Freq: Two times a day (BID) | ORAL | Status: DC

## 2013-01-04 NOTE — Patient Instructions (Signed)
It was nice to meet you today.  I think it's fair to try and treat you for a sinus infection.  I am giving you a medicine you will take 2 times per day for the next 10 days.  If this does not help, we may consider trying a different nose spray and should consider refitting your CPAP mask.  Come back to see Dr. Sheffield Slider if you are not feeling better in the next 1-2 weeks.    Sinusitis Sinusitis an infection of the air pockets (sinuses) in your face. This can cause puffiness (swelling). It can also cause drainage from your sinuses.  HOME CARE   Only take medicine as told by your doctor.  Drink enough fluids to keep your pee (urine) clear or pale yellow.  Apply moist heat or ice packs for pain relief.  Use salt (saline) nose sprays. The spray will wet the thick fluid in the nose. This can help the sinuses drain. GET HELP RIGHT AWAY IF:   You have a fever.  Your baby is older than 3 months with a rectal temperature of 102 F (38.9 C) or higher.  Your baby is 29 months old or younger with a rectal temperature of 100.4 F (38 C) or higher.  The pain gets worse.  You get a very bad headache.  You keep throwing up (vomiting).  Your face gets puffy. MAKE SURE YOU:   Understand these instructions.  Will watch your condition.  Will get help right away if you are not doing well or get worse. Document Released: 05/20/2008 Document Revised: 02/24/2012 Document Reviewed: 05/20/2008 [facility] Patient Information 2013 [place], [place].

## 2013-01-04 NOTE — Assessment & Plan Note (Signed)
Will try Doxy x 10 days (PCN allergy).  If no or minimal improvement, could consider rhinorrhea to be vasomotor and try atrovent nasal spray + refit cpap mask since discomfort is worse in the morning after having mask on.

## 2013-01-04 NOTE — Progress Notes (Signed)
S: Pt comes in today for SDA for sinus problems.  She reports problems off and on since June 2013- has been on Flonase but didn't seem to help.  When wears her CPAP mask at night time, feels like something is squeezing her temples.  Eyes have been itchy and watery.  Has been having problems with her eyes for the past 1-2 weeks, right eye is much worse than left eye.  Has been waking up with crusting over right eye in the AM for the past 2 weeks.  Has tried Claritin-D, which helped a little, is no longer using the flonase for the past 2 weeks.  Has not tried anything else.    + congestion, + temple pressure, worse first thing in the morning and on right; + nasal drainage/rhinorrhea. No fevers.  No cough.     ROS: Per HPI  History  Smoking status  . Former Smoker -- 1.0 packs/day  . Types: Cigarettes  . Quit date: 03/16/1969  Smokeless tobacco  . Never Used    O:  Filed Vitals:   01/04/13 1530  BP: 106/64  Pulse: 70  Temp: 98.4 F (36.9 C)    Gen: NAD, pleasant HEENT: MMM, mild scleral injection on right without drainage or d/c, EOMI, PERRLA, no pharyngeal erythema or exudate, no cervical LAD, nasal mucosa normal without erythema or bogginess, + sinus TTP over right frontal sinus, otherwise NT Pulm: CTA bilat, no wheezes or crackles   A/P: 62 y.o. female p/w sinusitis -See problem list -f/u in 1-2 weeks with PCP

## 2013-01-09 ENCOUNTER — Other Ambulatory Visit (HOSPITAL_COMMUNITY): Payer: Self-pay | Admitting: Family Medicine

## 2013-01-26 ENCOUNTER — Ambulatory Visit: Payer: Medicare Other | Admitting: Family Medicine

## 2013-01-27 ENCOUNTER — Ambulatory Visit (INDEPENDENT_AMBULATORY_CARE_PROVIDER_SITE_OTHER): Payer: Medicare Other | Admitting: Family Medicine

## 2013-01-27 ENCOUNTER — Encounter: Payer: Self-pay | Admitting: Family Medicine

## 2013-01-27 VITALS — BP 102/60 | HR 80 | Ht 62.0 in | Wt 215.0 lb

## 2013-01-27 DIAGNOSIS — R0981 Nasal congestion: Secondary | ICD-10-CM

## 2013-01-27 DIAGNOSIS — J3489 Other specified disorders of nose and nasal sinuses: Secondary | ICD-10-CM

## 2013-01-27 MED ORDER — CETIRIZINE HCL 10 MG PO TABS: 10.0000 mg | ORAL_TABLET | Freq: Every day | ORAL | Status: DC

## 2013-01-27 NOTE — Patient Instructions (Addendum)
Thank you for coming in today, it was good to see you Use your flonase daily for now I will add a medication call cetirizine as well Follow up with Dr. Sheffield Slider as needed.   Allergic Rhinitis Allergic rhinitis is when the mucous membranes in the nose respond to allergens. Allergens are particles in the air that cause your body to have an allergic reaction. This causes you to release allergic antibodies. Through a chain of events, these eventually cause you to release histamine into the blood stream (hence the use of antihistamines). Although meant to be protective to the body, it is this release that causes your discomfort, such as frequent sneezing, congestion and an itchy runny nose.  CAUSES  The pollen allergens may come from grasses, trees, and weeds. This is seasonal allergic rhinitis, or "hay fever." Other allergens cause year-round allergic rhinitis (perennial allergic rhinitis) such as house dust mite allergen, pet dander and mold spores.  SYMPTOMS   Nasal stuffiness (congestion).  Runny, itchy nose with sneezing and tearing of the eyes.  There is often an itching of the mouth, eyes and ears. It cannot be cured, but it can be controlled with medications. DIAGNOSIS  If you are unable to determine the offending allergen, skin or blood testing may find it. TREATMENT   Avoid the allergen.  Medications and allergy shots (immunotherapy) can help.  Hay fever may often be treated with antihistamines in pill or nasal spray forms. Antihistamines block the effects of histamine. There are over-the-counter medicines that may help with nasal congestion and swelling around the eyes. Check with your caregiver before taking or giving this medicine. If the treatment above does not work, there are many new medications your caregiver can prescribe. Stronger medications may be used if initial measures are ineffective. Desensitizing injections can be used if medications and avoidance fails. Desensitization  is when a patient is given ongoing shots until the body becomes less sensitive to the allergen. Make sure you follow up with your caregiver if problems continue. SEEK MEDICAL CARE IF:   You develop fever (more than 100.5 F (38.1 C).  You develop a cough that does not stop easily (persistent).  You have shortness of breath.  You start wheezing.  Symptoms interfere with normal daily activities. Document Released: 08/27/2001 Document Revised: 02/24/2012 Document Reviewed: 03/08/2009 [facility] Patient Information 2013 [place], [place].

## 2013-01-31 DIAGNOSIS — J3489 Other specified disorders of nose and nasal sinuses: Secondary | ICD-10-CM | POA: Insufficient documentation

## 2013-01-31 DIAGNOSIS — R0981 Nasal congestion: Secondary | ICD-10-CM | POA: Insufficient documentation

## 2013-01-31 NOTE — Progress Notes (Signed)
  Subjective:    Patient ID: Misty Gray, female    DOB: [birth year], 62 y.o.   MRN: [id number]  HPI  1. Congestion:  C/o of nasal and sinus congestion along with watery itchy eyes and frequent clear rhinorrhea.  Rhinorrhea can occur at any time and "drips like a faucet."   Also has frequent bouts of sneezing.  Was treated a few weeks ago for sinusitis which improved.  Also has flonase rx'ed for allergic rhinitis although she hasn't used recently.  She denies headache, fever, chills, sinus pain.   Review of Systems Per HPI    Objective:   Physical Exam  Constitutional:  Obese female, nad   HENT:  Head: Normocephalic and atraumatic.  Mouth/Throat: Oropharynx is clear and moist.  Nose with boggy pale turbinates bilaterally.  Some clear nasal discharge observed.    Eyes: Conjunctivae are normal. Pupils are equal, round, and reactive to light.  Neck: Neck supple.  Pulmonary/Chest: Breath sounds normal. No respiratory distress. She has no wheezes.  Neurological: She is alert.          Assessment & Plan:

## 2013-01-31 NOTE — Assessment & Plan Note (Signed)
Likely 2/2 to allergic rhinitis.  WIll have her use flonase daily for now and will add cetirizine.  She may have a component of vasomotor rhinits as well, will see if she responds to nasal steroids for now.

## 2013-02-01 ENCOUNTER — Other Ambulatory Visit: Payer: Self-pay | Admitting: Family Medicine

## 2013-02-08 ENCOUNTER — Other Ambulatory Visit: Payer: Self-pay | Admitting: Family Medicine

## 2013-02-08 ENCOUNTER — Ambulatory Visit (INDEPENDENT_AMBULATORY_CARE_PROVIDER_SITE_OTHER): Payer: Medicare Other | Admitting: *Deleted

## 2013-02-08 DIAGNOSIS — Z7901 Long term (current) use of anticoagulants: Secondary | ICD-10-CM

## 2013-02-08 DIAGNOSIS — Z5181 Encounter for therapeutic drug level monitoring: Secondary | ICD-10-CM

## 2013-02-11 ENCOUNTER — Encounter (HOSPITAL_COMMUNITY): Payer: Self-pay | Admitting: Family Medicine

## 2013-02-11 ENCOUNTER — Encounter: Payer: Self-pay | Admitting: Family Medicine

## 2013-02-11 ENCOUNTER — Other Ambulatory Visit: Payer: Self-pay

## 2013-02-11 ENCOUNTER — Emergency Department (HOSPITAL_COMMUNITY)
Admission: EM | Admit: 2013-02-11 | Discharge: 2013-02-11 | Disposition: A | Discharge status: Home / Self Care | Payer: Medicare Other | Attending: Emergency Medicine | Admitting: Emergency Medicine

## 2013-02-11 ENCOUNTER — Emergency Department (HOSPITAL_COMMUNITY): Payer: Medicare Other

## 2013-02-11 DIAGNOSIS — E7800 Pure hypercholesterolemia, unspecified: Secondary | ICD-10-CM | POA: Insufficient documentation

## 2013-02-11 DIAGNOSIS — S4980XA Other specified injuries of shoulder and upper arm, unspecified arm, initial encounter: Secondary | ICD-10-CM | POA: Insufficient documentation

## 2013-02-11 DIAGNOSIS — Z87448 Personal history of other diseases of urinary system: Secondary | ICD-10-CM | POA: Insufficient documentation

## 2013-02-11 DIAGNOSIS — W010XXA Fall on same level from slipping, tripping and stumbling without subsequent striking against object, initial encounter: Secondary | ICD-10-CM | POA: Insufficient documentation

## 2013-02-11 DIAGNOSIS — Z7901 Long term (current) use of anticoagulants: Secondary | ICD-10-CM | POA: Insufficient documentation

## 2013-02-11 DIAGNOSIS — I509 Heart failure, unspecified: Secondary | ICD-10-CM | POA: Insufficient documentation

## 2013-02-11 DIAGNOSIS — Y939 Activity, unspecified: Secondary | ICD-10-CM | POA: Insufficient documentation

## 2013-02-11 DIAGNOSIS — Z8709 Personal history of other diseases of the respiratory system: Secondary | ICD-10-CM | POA: Insufficient documentation

## 2013-02-11 DIAGNOSIS — S46909A Unspecified injury of unspecified muscle, fascia and tendon at shoulder and upper arm level, unspecified arm, initial encounter: Secondary | ICD-10-CM | POA: Insufficient documentation

## 2013-02-11 DIAGNOSIS — Z9989 Dependence on other enabling machines and devices: Secondary | ICD-10-CM | POA: Insufficient documentation

## 2013-02-11 DIAGNOSIS — Z79899 Other long term (current) drug therapy: Secondary | ICD-10-CM | POA: Insufficient documentation

## 2013-02-11 DIAGNOSIS — Z8719 Personal history of other diseases of the digestive system: Secondary | ICD-10-CM | POA: Insufficient documentation

## 2013-02-11 DIAGNOSIS — Z8669 Personal history of other diseases of the nervous system and sense organs: Secondary | ICD-10-CM | POA: Insufficient documentation

## 2013-02-11 DIAGNOSIS — Z8739 Personal history of other diseases of the musculoskeletal system and connective tissue: Secondary | ICD-10-CM | POA: Insufficient documentation

## 2013-02-11 DIAGNOSIS — Z9181 History of falling: Secondary | ICD-10-CM | POA: Insufficient documentation

## 2013-02-11 DIAGNOSIS — K219 Gastro-esophageal reflux disease without esophagitis: Secondary | ICD-10-CM | POA: Insufficient documentation

## 2013-02-11 DIAGNOSIS — R55 Syncope and collapse: Secondary | ICD-10-CM | POA: Insufficient documentation

## 2013-02-11 DIAGNOSIS — G4733 Obstructive sleep apnea (adult) (pediatric): Secondary | ICD-10-CM | POA: Insufficient documentation

## 2013-02-11 DIAGNOSIS — Y929 Unspecified place or not applicable: Secondary | ICD-10-CM | POA: Insufficient documentation

## 2013-02-11 DIAGNOSIS — I10 Essential (primary) hypertension: Secondary | ICD-10-CM | POA: Insufficient documentation

## 2013-02-11 DIAGNOSIS — Z862 Personal history of diseases of the blood and blood-forming organs and certain disorders involving the immune mechanism: Secondary | ICD-10-CM | POA: Insufficient documentation

## 2013-02-11 DIAGNOSIS — Z8679 Personal history of other diseases of the circulatory system: Secondary | ICD-10-CM | POA: Insufficient documentation

## 2013-02-11 DIAGNOSIS — W19XXXA Unspecified fall, initial encounter: Secondary | ICD-10-CM

## 2013-02-11 DIAGNOSIS — Z87891 Personal history of nicotine dependence: Secondary | ICD-10-CM | POA: Insufficient documentation

## 2013-02-11 DIAGNOSIS — Z950 Presence of cardiac pacemaker: Secondary | ICD-10-CM | POA: Insufficient documentation

## 2013-02-11 DIAGNOSIS — Z8701 Personal history of pneumonia (recurrent): Secondary | ICD-10-CM | POA: Insufficient documentation

## 2013-02-11 DIAGNOSIS — Z872 Personal history of diseases of the skin and subcutaneous tissue: Secondary | ICD-10-CM | POA: Insufficient documentation

## 2013-02-11 LAB — BASIC METABOLIC PANEL
BUN: 15 mg/dL (ref 6–23)
Calcium: 9.6 mg/dL (ref 8.4–10.5)
Creatinine, Ser: 0.69 mg/dL (ref 0.50–1.10)
GFR calc non Af Amer: >90 mL/min (ref >90)
Glucose, Bld: 98 mg/dL (ref 70–99)
Sodium: 137 mEq/L (ref 135–145)

## 2013-02-11 LAB — CBC WITH DIFFERENTIAL/PLATELET
Eosinophils Absolute: 0.1 10*3/uL (ref 0.0–0.7)
Eosinophils Relative: 1 % (ref 0–5)
Lymphs Abs: 1.7 10*3/uL (ref 0.7–4.0)
MCH: 32 pg (ref 26.0–34.0)
MCV: 92.6 fL (ref 78.0–100.0)
Monocytes Absolute: 0.5 10*3/uL (ref 0.1–1.0)
Monocytes Relative: 7 % (ref 3–12)
Platelets: 146 10*3/uL — ABNORMAL LOW (ref 150–400)
RBC: 4.44 MIL/uL (ref 3.87–5.11)

## 2013-02-11 NOTE — ED Notes (Signed)
St jude at bedside

## 2013-02-11 NOTE — ED Provider Notes (Signed)
I saw and evaluated the patient, reviewed the resident's note and I agree with the findings and plan. I agree with resident's EKG interpretation.  Mechanical fall with R shoulder pain. No LOC, syncope, dizziness, CP, SOB. On coumadin, pacemaker. EKG, interrogate pacer, xrays.  Glynn Octave, MD 02/11/13 1745

## 2013-02-11 NOTE — ED Notes (Signed)
Patient transported to X-ray 

## 2013-02-11 NOTE — ED Notes (Signed)
Return from xray

## 2013-02-11 NOTE — ED Provider Notes (Signed)
History     CSN: [id number]  Arrival date & time 02/11/13  1028   First MD Initiated Contact with Patient 02/11/13 1033      Chief Complaint  Patient presents with  . Fall    HPI Misty Gray is a 62 y.o. female who presents to the ED after a fall.  Happened about 1 hour PTA.  Reports turning around and abruptly falling.  Slipped on gravel.  No LOC.  No dizziness/lightheadedness sensation.  No palpitations.  No SOB.  Now at baseline.  Only pain is R shoulder pain as she landed on this.  Described as mild, worse with moving the shoulder, better with rest.  No radiation.  No other trauma.  No head trauma.  No neck/back/chest/abdominal pain.  No other symptoms.  Past Medical History  Diagnosis Date  . Abnormal CT of the head 12/16/1984    R parietal atrophy  . Nonischemic cardiomyopathy     EF has normalized-repeat Pending   . Hypertension   . GERD (gastroesophageal reflux disease)   . Unspecified venous (peripheral) insufficiency   . Diaphragmatic hernia without mention of obstruction or gangrene   . Female stress incontinence   . Allergic rhinitis, cause unspecified   . Morbid obesity   . Lichenification and lichen simplex chronicus   . Pacemaker  st judes   . Heart murmur   . CHF (congestive heart failure)   . H/O hiatal hernia     two removed  . Anemia   . High cholesterol   . Pneumonia 2004; 2011; 11/2011  . Sleep apnea, obstructive     CPAP  . Exertional dyspnea 06/22/12  . Migraine, unspecified, without mention of intractable migraine without mention of status migrainosus     "when I was a teenager"  . Seizures     "can hear you talking but sounds like you are in big tunnel; have them often if not taking RX; last one was 06/17/12" (06/22/12)  . Stroke 1988    "mouth drawed real bad on my left side"  . Sick sinus syndrome     WITH PRIOR DDD PACEMAKER IMPLANTATION  . Atrial fibrillation   . Syncope and collapse 06/22/12    "hit forehead and left knee"; denies loss of  consciousness  . Arthritis     "hands and knees"    Past Surgical History  Procedure Laterality Date  . Leg skin lesion  biopsy / excision  05/16/2001    Lichen Planus  . Hernia repair  ? 1988; 04/15/1998    ventral  . Insert / replace / remove pacemaker  12/16/1990    DDD EF 30%;  Marland Kitchen Insert / replace / remove pacemaker  07/25/2003    replaced by BB, leads are both IS1 and from 1992  . Insert / replace / remove pacemaker  05/21/2011    JA; generator change    Family History  Problem Relation Age of Onset  . Stroke Father     died from CAD?  Marland Kitchen Cancer Mother     melanoma  . Diabetes Son     Type 1  . Sleep apnea Son   . Cancer Sister     cervical  . Hypertension Sister   . Hypertension Brother   . Rheum arthritis Daughter     Also PGM, PGGM    History  Substance Use Topics  . Smoking status: Former Smoker -- 1.00 packs/day    Types: Cigarettes    Quit date:  03/16/1969  . Smokeless tobacco: Never Used  . Alcohol Use: No    OB History   Grav Para Term Preterm Abortions TAB SAB Ect Mult Living                  Review of Systems  Constitutional: Negative for fever and chills.  HENT: Negative for congestion, rhinorrhea, neck pain and neck stiffness.   Respiratory: Negative for cough and shortness of breath.   Cardiovascular: Negative for chest pain.  Gastrointestinal: Negative for nausea, vomiting, abdominal pain, diarrhea and abdominal distention.  Endocrine: Negative for polyuria.  Genitourinary: Negative for dysuria.  Skin: Negative for rash.  Neurological: Positive for syncope. Negative for dizziness, tremors, seizures, facial asymmetry, speech difficulty, weakness, light-headedness, numbness and headaches.  Psychiatric/Behavioral: Negative.   All other systems reviewed and are negative.    Allergies  Aspirin and Penicillins  Home Medications   Current Outpatient Rx  Name  Route  Sig  Dispense  Refill  . cetirizine (ZYRTEC) 10 MG tablet   Oral   Take 1  tablet (10 mg total) by mouth daily.   30 tablet   11   . Cholecalciferol (VITAMIN D3) 5000 UNITS CHEW   Oral   Chew 1 tablet by mouth 2 (two) times a week.         . digoxin (LANOXIN) 0.125 MG tablet   Oral   Take 1 tablet (125 mcg total) by mouth daily.   30 tablet   5   . enalapril (VASOTEC) 5 MG tablet   Oral   Take 5 mg by mouth daily.         . fluticasone (FLONASE) 50 MCG/ACT nasal spray   Nasal   Place 2 sprays into the nose daily as needed. For congestion.   16 g   1   . furosemide (LASIX) 80 MG tablet   Oral   Take 80 mg by mouth daily.         . halobetasol (ULTRAVATE) 0.05 % cream   Topical   Apply topically 2 (two) times daily.         . metoprolol succinate (TOPROL-XL) 100 MG 24 hr tablet   Oral   Take 1 tablet (100 mg total) by mouth daily. Take with or immediately following a meal.   30 tablet   1   . omeprazole (PRILOSEC) 20 MG capsule   Oral   Take 20 mg by mouth daily.         Marland Kitchen PHENobarbital (LUMINAL) 32.4 MG tablet   Oral   Take 97.2 mg by mouth at bedtime.         Marland Kitchen spironolactone (ALDACTONE) 25 MG tablet   Oral   Take 1 tablet (25 mg total) by mouth daily.   30 tablet   5   . warfarin (COUMADIN) 5 MG tablet   Oral   Take 10-15 mg by mouth daily. Take 3 tablets on Fri, take 2 tablets all other days           There were no vitals taken for this visit.  Physical Exam  Nursing note and vitals reviewed. Constitutional: She is oriented to person, place, and time. She appears well-developed and well-nourished. No distress.  HENT:  Head: Normocephalic and atraumatic.  Right Ear: External ear normal.  Left Ear: External ear normal.  Nose: Nose normal.  Mouth/Throat: Oropharynx is clear and moist. No oropharyngeal exudate.  Eyes: EOM are normal. Pupils are equal, round, and reactive to  light.  Neck: Normal range of motion. Neck supple. No tracheal deviation present.  Cardiovascular: Normal rate.   Pulmonary/Chest:  Effort normal and breath sounds normal. No stridor. No respiratory distress. She has no wheezes. She has no rales.  Abdominal: Soft. She exhibits no distension. There is no tenderness. There is no rebound.  Musculoskeletal: Normal range of motion.  RUE: pain with abduction of arm.  No deformity.  ROM otherwise normal.    Neurological: She is alert and oriented to person, place, and time. She has normal strength and normal reflexes. No cranial nerve deficit or sensory deficit. She displays a negative Romberg sign. GCS eye subscore is 4. GCS verbal subscore is 5. GCS motor subscore is 6.  Skin: Skin is warm and dry. She is not diaphoretic.    ED Course  Procedures (including critical care time)  Labs Reviewed  CBC WITH DIFFERENTIAL - Abnormal; Notable for the following:    Platelets 146 (*)    All other components within normal limits  APTT - Abnormal; Notable for the following:    aPTT 38 (*)    All other components within normal limits  PROTIME-INR - Abnormal; Notable for the following:    Prothrombin Time 20.4 (*)    INR 1.82 (*)    All other components within normal limits  BASIC METABOLIC PANEL   Dg Chest 2 View  02/11/2013  *RADIOLOGY REPORT*  Clinical Data: Hypertension. Cough.  Fall.  CHEST - 2 VIEW  Comparison: 06/22/2012  Findings: Mild accentuation of expected thoracic kyphosis. Pacer with leads at right atrium and right ventricle.  No lead discontinuity.  The chin overlies the apices.  Mild cardiomegaly. No pleural effusion or pneumothorax.  Clear lungs.  IMPRESSION: No acute post-traumatic deformity identified.  Cardiomegaly without congestive failure.   Original Report Authenticated By: Jeronimo Greaves, M.D.    Dg Shoulder Right  02/11/2013  *RADIOLOGY REPORT*  Clinical Data: Fall.  Pain.  Hypertension.  RIGHT SHOULDER - 2+ VIEW  Comparison: None.  Findings: Degenerative irregularity of the acromioclavicular joint. Minimal insertional irregularity at the rotator cuff. Visualized  portion of the right hemithorax is normal.  No acute fracture or dislocation.  IMPRESSION: No acute osseous abnormality.   Original Report Authenticated By: Jeronimo Greaves, M.D.    Ct Head Wo Contrast  02/11/2013  *RADIOLOGY REPORT*  Clinical Data: History of trauma from a fall.  CT HEAD WITHOUT CONTRAST  Technique:  Contiguous axial images were obtained from the base of the skull through the vertex without contrast.  Comparison: Head CT 06/22/2012.  Findings: Mild generalized cerebral and cerebellar atrophy is again noted.  There are old right cerebral hemisphere infarctions in the right MCA territory and in the medial aspect of the right parietal lobe where there are areas of chronic encephalomalacia.  Patchy areas of decreased attenuation throughout the deep and periventricular white matter of the cerebral hemispheres bilaterally is compatible with very mild chronic microvascular ischemic disease. No acute displaced skull fractures are identified.  No acute intracranial abnormality.  Specifically, no evidence of acute post-traumatic intracranial hemorrhage, no definite regions of acute/subacute cerebral ischemia, no focal mass, mass effect, hydrocephalus or abnormal intra or extra-axial fluid collections.  The visualized paranasal sinuses and mastoids are generally well pneumatized, with exception of a small amount mucosal thickening throughout the right maxillary sinus.  IMPRESSION: 1.  No acute displaced skull fractures or acute intracranial findings. 2.  Mild cerebral and cerebellar atrophy with old right cerebral hemisphere chronic infarctions and  mild chronic ischemic changes in the white matter redemonstrated, as above.   Original Report Authenticated By: Trudie Reed, M.D.      Date: 02/11/2013  Rate: 77  Rhythm: atrial fibrillation  QRS Axis: normal  Intervals: QT normal  ST/T Wave abnormalities: Inverted T waves in multiple leads  Conduction Disutrbances:none  Narrative Interpretation:  atrial fibrillation with numerous PVCs.  Old EKG Reviewed: unchanged    1. Fall, initial encounter       MDM   KATHERINE TOUT is a 62 y.o. female who presents to the ED for concern of mechanical fall and R shoulder pain.  Shoulder pain benign.  XR negative.  Basic labs checked given comorbidities.  Negative.  Pacemaker interrogated and without event.  Patient at baseline with no pain.  Safe for discharge.  Patient discharged.      Arloa Koh, MD 02/11/13 1635

## 2013-02-11 NOTE — ED Notes (Signed)
Per pt had a fall this am. sts she tripped and fell on right shoulder. No deformity noted. Slight bruising. Pt able to move extremity

## 2013-02-23 ENCOUNTER — Other Ambulatory Visit: Payer: Self-pay | Admitting: *Deleted

## 2013-02-23 MED ORDER — CETIRIZINE HCL 10 MG PO TABS: 10.0000 mg | ORAL_TABLET | Freq: Every day | ORAL | Status: DC

## 2013-03-10 ENCOUNTER — Encounter: Payer: Self-pay | Admitting: Home Health Services

## 2013-03-15 ENCOUNTER — Other Ambulatory Visit: Payer: Self-pay | Admitting: Family Medicine

## 2013-03-15 ENCOUNTER — Ambulatory Visit (INDEPENDENT_AMBULATORY_CARE_PROVIDER_SITE_OTHER): Payer: Medicare Other | Admitting: *Deleted

## 2013-03-15 DIAGNOSIS — Z5181 Encounter for therapeutic drug level monitoring: Secondary | ICD-10-CM

## 2013-03-15 DIAGNOSIS — Z7901 Long term (current) use of anticoagulants: Secondary | ICD-10-CM

## 2013-03-29 ENCOUNTER — Ambulatory Visit (INDEPENDENT_AMBULATORY_CARE_PROVIDER_SITE_OTHER): Payer: Medicare Other | Admitting: *Deleted

## 2013-03-29 DIAGNOSIS — Z5181 Encounter for therapeutic drug level monitoring: Secondary | ICD-10-CM

## 2013-03-29 DIAGNOSIS — Z7901 Long term (current) use of anticoagulants: Secondary | ICD-10-CM

## 2013-04-05 ENCOUNTER — Other Ambulatory Visit: Payer: Self-pay | Admitting: Internal Medicine

## 2013-04-05 ENCOUNTER — Ambulatory Visit (INDEPENDENT_AMBULATORY_CARE_PROVIDER_SITE_OTHER): Payer: Medicare Other | Admitting: *Deleted

## 2013-04-05 ENCOUNTER — Encounter: Payer: Self-pay | Admitting: *Deleted

## 2013-04-05 DIAGNOSIS — I4891 Unspecified atrial fibrillation: Secondary | ICD-10-CM

## 2013-04-05 LAB — PACEMAKER DEVICE OBSERVATION
AL IMPEDENCE PM: 450 Ohm
ATRIAL PACING PM: 1.8
ATRIAL PACING PM: 1.8
BAMS-0001: 180 {beats}/min
BATTERY VOLTAGE: 2.9328 V
BRDY-0002RV: 70 {beats}/min
BRDY-0004RV: 120 {beats}/min
BRDY-0004RV: 120 {beats}/min
DEVICE MODEL PM: 7254513
RV LEAD THRESHOLD: 1 V
RV LEAD THRESHOLD: 1 V

## 2013-04-05 NOTE — Progress Notes (Signed)
Pacemaker check in clinic. Battery longevity 3.6 to 5.3 years. Normal device function. Pt in AF 99% of time. + Warfarin. 1 ventricular high rate episode---AF w/RVR. 23 noise reversions. Consider changing mode at next visit. ROV in 6 mths with JA.

## 2013-04-06 ENCOUNTER — Ambulatory Visit (INDEPENDENT_AMBULATORY_CARE_PROVIDER_SITE_OTHER): Payer: Medicare Other | Admitting: *Deleted

## 2013-04-06 ENCOUNTER — Encounter: Payer: Self-pay | Admitting: Family Medicine

## 2013-04-06 ENCOUNTER — Ambulatory Visit (INDEPENDENT_AMBULATORY_CARE_PROVIDER_SITE_OTHER): Payer: Medicare Other | Admitting: Family Medicine

## 2013-04-06 VITALS — BP 92/60 | HR 71 | Ht 61.0 in | Wt 207.0 lb

## 2013-04-06 DIAGNOSIS — I5022 Chronic systolic (congestive) heart failure: Secondary | ICD-10-CM

## 2013-04-06 DIAGNOSIS — M25519 Pain in unspecified shoulder: Secondary | ICD-10-CM

## 2013-04-06 DIAGNOSIS — J309 Allergic rhinitis, unspecified: Secondary | ICD-10-CM

## 2013-04-06 DIAGNOSIS — Z5181 Encounter for therapeutic drug level monitoring: Secondary | ICD-10-CM

## 2013-04-06 DIAGNOSIS — M25511 Pain in right shoulder: Secondary | ICD-10-CM | POA: Insufficient documentation

## 2013-04-06 DIAGNOSIS — Z7901 Long term (current) use of anticoagulants: Secondary | ICD-10-CM

## 2013-04-06 MED ORDER — FUROSEMIDE 80 MG PO TABS: 80.0000 mg | ORAL_TABLET | Freq: Every day | ORAL | Status: DC

## 2013-04-06 MED ORDER — LORATADINE 10 MG PO TABS: 10.0000 mg | ORAL_TABLET | Freq: Every day | ORAL | Status: DC

## 2013-04-06 NOTE — Assessment & Plan Note (Addendum)
Perhaps overdiuresed causing hypotension Will hold Spirolnolactone  She didn't decrease furosemide last time, so I emphasized decreasing the furosemide to one tablet in the AM

## 2013-04-06 NOTE — Progress Notes (Signed)
  Subjective:    Patient ID: Misty Gray, female    DOB: 01-Jan-1951, 62 y.o.   MRN: [id number]  HPI CHF - No shortness of breath more than she's had chronically.   Hypotension - no lightheadedness.   Seizures - small one 2 months ago.  Review of Systems     Objective:   Physical Exam  Constitutional: She is oriented to person, place, and time.  Cardiovascular: Normal rate and regular rhythm.   Murmur heard. Pulmonary/Chest: Effort normal and breath sounds normal. She has no wheezes. She has no rales.  Musculoskeletal: She exhibits edema.  Limited abduction and external rotation of her right shoulder. Neg impingement sign and drop test.   Edema 1+ R, trace L   Neurological: She is alert and oriented to person, place, and time.  Skin:  left ankle mildly red in the area of her chronic rash          Assessment & Plan:

## 2013-04-06 NOTE — Assessment & Plan Note (Signed)
Has lost weight, but still morbidly obese

## 2013-04-06 NOTE — Patient Instructions (Addendum)
Decrease your furosemide to one 80 mg tablet each morning  Stop the Aldactone 25 mg tablets till the next visit  Please return to see Dr Sheffield Slider in 2 weeks.

## 2013-04-06 NOTE — Assessment & Plan Note (Signed)
Concern for partial rotator cuff tear and risk for frozen shoulder

## 2013-04-20 ENCOUNTER — Ambulatory Visit (INDEPENDENT_AMBULATORY_CARE_PROVIDER_SITE_OTHER): Payer: Medicare Other | Admitting: Family Medicine

## 2013-04-20 ENCOUNTER — Ambulatory Visit (INDEPENDENT_AMBULATORY_CARE_PROVIDER_SITE_OTHER): Payer: Medicare Other | Admitting: *Deleted

## 2013-04-20 ENCOUNTER — Encounter: Payer: Self-pay | Admitting: Family Medicine

## 2013-04-20 ENCOUNTER — Telehealth: Payer: Self-pay | Admitting: Internal Medicine

## 2013-04-20 VITALS — BP 86/62 | HR 74 | Ht 61.0 in | Wt 218.0 lb

## 2013-04-20 DIAGNOSIS — Z7901 Long term (current) use of anticoagulants: Secondary | ICD-10-CM

## 2013-04-20 DIAGNOSIS — Z5181 Encounter for therapeutic drug level monitoring: Secondary | ICD-10-CM

## 2013-04-20 DIAGNOSIS — I5022 Chronic systolic (congestive) heart failure: Secondary | ICD-10-CM

## 2013-04-20 NOTE — Telephone Encounter (Signed)
Follow Up ° ° ° ° ° °Pt calling in returning phone call from earlier. Please call back. °

## 2013-04-20 NOTE — Assessment & Plan Note (Signed)
Worsened HF off Spirololactone. Will restart that and arrange cardiology follow up for recommendations considering her hypotension.

## 2013-04-20 NOTE — Progress Notes (Signed)
  Subjective:    Patient ID: Misty Gray, female    DOB: 1951/07/10, 62 y.o.   MRN: [id number]  HPI CHF - She admits to eating salty food "like pizza" She chronically has dyspnea on exertion and sleeps on 2 pillows, but denies paroxysmal nocturnal dyspnea. Her ankles have been swelling more. No chest pain. She is taking the 80 mg of furosemide but is off the spironolactone as recommended.  Hypotension - hasn't been feeling lightheaded   Review of Systems     Objective:   Physical Exam  Cardiovascular: Normal rate and regular rhythm.   Pulmonary/Chest: Effort normal. No respiratory distress. She has no wheezes. She has rales.  right basilar crackles and decreased breath sounds.   Musculoskeletal: She exhibits edema.  2-3+ edema bilaterally  Neurological: She is alert.  Skin:  Shiny, erythematous skin bilateral lower legs.   Psychiatric: She has a normal mood and affect.  Jovial, writing poetry as usual.          Assessment & Plan:

## 2013-04-20 NOTE — Telephone Encounter (Signed)
LMTCB ./CY 

## 2013-04-20 NOTE — Patient Instructions (Addendum)
Restart the Aldactone. Call me if you get very lightheaded.   We will schedule with Dr Johney Frame or one of his partners to see you for your heart failure  Please return to see Dr Sheffield Slider in 1 month

## 2013-04-20 NOTE — Telephone Encounter (Signed)
New problem   [facility] stated pt is having swelling in legs and fluid in lower lungs. Want to sched an appt with Dr Johney Frame in 1-2 wks but there wasn't any appt available in that time frame. Please speak to Misty Gray concerning this matter.

## 2013-04-22 ENCOUNTER — Ambulatory Visit: Payer: Medicare Other

## 2013-04-22 NOTE — Telephone Encounter (Signed)
Spoke with Lupita Leash at [facility] who states patient has appointment with Dr. Johney Frame in 2 weeks which is fine.

## 2013-05-03 ENCOUNTER — Ambulatory Visit (INDEPENDENT_AMBULATORY_CARE_PROVIDER_SITE_OTHER): Payer: Medicare Other | Admitting: *Deleted

## 2013-05-03 ENCOUNTER — Ambulatory Visit: Payer: Medicare Other

## 2013-05-03 DIAGNOSIS — Z7901 Long term (current) use of anticoagulants: Secondary | ICD-10-CM

## 2013-05-03 DIAGNOSIS — Z5181 Encounter for therapeutic drug level monitoring: Secondary | ICD-10-CM

## 2013-05-05 ENCOUNTER — Ambulatory Visit (INDEPENDENT_AMBULATORY_CARE_PROVIDER_SITE_OTHER): Payer: Medicare Other | Admitting: Internal Medicine

## 2013-05-05 ENCOUNTER — Encounter: Payer: Self-pay | Admitting: Internal Medicine

## 2013-05-05 VITALS — BP 116/78 | HR 68 | Ht 62.0 in | Wt 211.2 lb

## 2013-05-05 DIAGNOSIS — I519 Heart disease, unspecified: Secondary | ICD-10-CM | POA: Insufficient documentation

## 2013-05-05 DIAGNOSIS — Z950 Presence of cardiac pacemaker: Secondary | ICD-10-CM

## 2013-05-05 DIAGNOSIS — I428 Other cardiomyopathies: Secondary | ICD-10-CM

## 2013-05-05 DIAGNOSIS — R001 Bradycardia, unspecified: Secondary | ICD-10-CM | POA: Insufficient documentation

## 2013-05-05 DIAGNOSIS — I498 Other specified cardiac arrhythmias: Secondary | ICD-10-CM

## 2013-05-05 DIAGNOSIS — I4891 Unspecified atrial fibrillation: Secondary | ICD-10-CM

## 2013-05-05 LAB — PACEMAKER DEVICE OBSERVATION
AL IMPEDENCE PM: 460 Ohm
ATRIAL PACING PM: 1
BRDY-0002RV: 70 {beats}/min
BRDY-0004RV: 120 {beats}/min
RV LEAD THRESHOLD: 1.25 V

## 2013-05-05 MED ORDER — DIGOXIN 125 MCG PO TABS: 125.0000 ug | ORAL_TABLET | Freq: Every day | ORAL | Status: DC

## 2013-05-05 MED ORDER — SPIRONOLACTONE 25 MG PO TABS: 25.0000 mg | ORAL_TABLET | Freq: Every day | ORAL | Status: DC

## 2013-05-05 MED ORDER — FUROSEMIDE 80 MG PO TABS: 80.0000 mg | ORAL_TABLET | Freq: Every day | ORAL | Status: DC

## 2013-05-05 MED ORDER — METOPROLOL SUCCINATE ER 100 MG PO TB24: 100.0000 mg | ORAL_TABLET | Freq: Every day | ORAL | Status: DC

## 2013-05-05 NOTE — Progress Notes (Signed)
PCP: Tobin Chad, MD  Misty Gray is a 62 y.o. female who presents today for routine electrophysiology followup.  Since last being seen in our clinic, the patient reports doing well.  She reports some swelling which has improved with recent medicine adjustments made by Dr Sheffield Slider.  Her breathing is at baseline.  Her CPAP is broken and has not been working.  She remains active.  She denies symptoms with her afib.  Today, she denies symptoms of palpitations, chest pain, shortness of breath,  lower extremity edema, dizziness, presyncope, or syncope.  The patient is otherwise without complaint today.   Past Medical History  Diagnosis Date  . Abnormal CT of the head 12/16/1984    R parietal atrophy  . Nonischemic cardiomyopathy     EF has normalized-repeat Pending   . Hypertension   . GERD (gastroesophageal reflux disease)   . Unspecified venous (peripheral) insufficiency   . Diaphragmatic hernia without mention of obstruction or gangrene   . Female stress incontinence   . Allergic rhinitis, cause unspecified   . Morbid obesity   . Lichenification and lichen simplex chronicus   . Pacemaker  st judes   . Heart murmur   . CHF (congestive heart failure)   . H/O hiatal hernia     two removed  . Anemia   . High cholesterol   . Pneumonia 2004; 2011; 11/2011  . Sleep apnea, obstructive     CPAP  . Exertional dyspnea 06/22/12  . Migraine, unspecified, without mention of intractable migraine without mention of status migrainosus     "when I was a teenager"  . Seizures     "can hear you talking but sounds like you are in big tunnel; have them often if not taking RX; last one was 06/17/12" (06/22/12)  . Stroke 1988    "mouth drawed real bad on my left side"  . Sick sinus syndrome     WITH PRIOR DDD PACEMAKER IMPLANTATION  . Atrial fibrillation   . Syncope and collapse 06/22/12    "hit forehead and left knee"; denies loss of consciousness  . Arthritis     "hands and knees"   Past Surgical  History  Procedure Laterality Date  . Leg skin lesion  biopsy / excision  05/16/2001    Lichen Planus  . Hernia repair  ? 1988; 04/15/1998    ventral  . Insert / replace / remove pacemaker  12/16/1990    DDD EF 30%;  Marland Kitchen Insert / replace / remove pacemaker  07/25/2003    replaced by BB, leads are both IS1 and from 1992  . Insert / replace / remove pacemaker  05/21/2011    JA; generator change    Current Outpatient Prescriptions  Medication Sig Dispense Refill  . Cholecalciferol (VITAMIN D3) 5000 UNITS CHEW Chew 1 tablet by mouth 2 (two) times a week.      . digoxin (LANOXIN) 0.125 MG tablet Take 1 tablet (125 mcg total) by mouth daily.  30 tablet  5  . enalapril (VASOTEC) 5 MG tablet Take 5 mg by mouth daily.      . fluticasone (FLONASE) 50 MCG/ACT nasal spray Place 2 sprays into the nose daily as needed. For congestion.  16 g  1  . furosemide (LASIX) 80 MG tablet Take 1 tablet (80 mg total) by mouth daily.      . halobetasol (ULTRAVATE) 0.05 % cream Apply topically 2 (two) times daily.      Marland Kitchen loratadine (CLARITIN) 10  MG tablet Take 1 tablet (10 mg total) by mouth daily.  30 tablet  11  . metoprolol succinate (TOPROL-XL) 100 MG 24 hr tablet TAKE 1 TABLET (100 MG TOTAL) BY MOUTH DAILY WITH OR IMMEDIATELY FOLLOWING A MEAL  30 tablet  1  . omeprazole (PRILOSEC) 20 MG capsule Take 20 mg by mouth daily.      Marland Kitchen PHENobarbital (LUMINAL) 32.4 MG tablet Take 97.2 mg by mouth at bedtime.      Marland Kitchen spironolactone (ALDACTONE) 25 MG tablet Take 25 mg by mouth daily.      Marland Kitchen warfarin (COUMADIN) 5 MG tablet Take 10-15 mg by mouth daily. Take 3 tablets on Fri, take 2 tablets all other days       No current facility-administered medications for this visit.    Physical Exam: Filed Vitals:   05/05/13 0911  BP: 116/78  Pulse: 68  Height: 5\' 2"  (1.575 m)  Weight: 211 lb 3.2 oz (95.8 kg)    GEN- The patient is well appearing, alert and oriented x 3 today.   Head- normocephalic, atraumatic Eyes-  Sclera clear,  conjunctiva pink Ears- hearing intact Oropharynx- clear Lungs- Clear to ausculation bilaterally, normal work of breathing Chest- pacemaker pocket is well healed Heart- irregular rate and rhythm, no murmurs, rubs or gallops, PMI not laterally displaced GI- soft, NT, ND, + BS Extremities- no clubbing, cyanosis, 1+ edema  Pacemaker interrogation- reviewed in detail today,  See PACEART report  Assessment and Plan:  1. Bradycardia Normal pacemaker function See Pace Art report Program VVI today  2 afib She has asymptomatic afib Continue coumadin for anticoagulation No changes today  3. Chronic systolic dysfunction Stable Could consider upgrading her device to CRT if her CHF worsens however given her preference to avoid hospitalization/ procedures we will defer this presently  Return in 1 year to see Misty Gray for management of CHF and device

## 2013-05-05 NOTE — Patient Instructions (Addendum)
Remote monitoring is used to monitor your Pacemaker of ICD from home. This monitoring reduces the number of office visits required to check your device to one time per year. It allows [place] to keep an eye on the functioning of your device to ensure it is working properly. You are scheduled for a device check from home on August 09, 2013. You may send your transmission at any time that day. If you have a wireless device, the transmission will be sent automatically. After your physician reviews your transmission, you will receive a postcard with your next transmission date.  Your physician wants you to follow-up in: 1 year with Jeannette Corpus.  You will receive a reminder letter in the mail two months in advance. If you don't receive a letter, please call our office to schedule the follow-up appointment.

## 2013-05-17 ENCOUNTER — Ambulatory Visit: Payer: Medicare Other

## 2013-05-18 ENCOUNTER — Other Ambulatory Visit: Payer: Self-pay | Admitting: *Deleted

## 2013-05-18 ENCOUNTER — Other Ambulatory Visit: Payer: Self-pay | Admitting: Family Medicine

## 2013-05-18 NOTE — Telephone Encounter (Signed)
Requested Prescriptions   Pending Prescriptions Disp Refills  . furosemide (LASIX) 80 MG tablet 90 tablet 3    Sig: Take 1 tablet (80 mg total) by mouth daily.  Marland Kitchen PHENobarbital (LUMINAL) 32.4 MG tablet      Sig: Take 3 tablets (97.2 mg total) by mouth at bedtime.  . enalapril (VASOTEC) 5 MG tablet      Sig: Take 1 tablet (5 mg total) by mouth daily.

## 2013-05-19 ENCOUNTER — Other Ambulatory Visit: Payer: Self-pay | Admitting: Family Medicine

## 2013-05-19 ENCOUNTER — Telehealth: Payer: Self-pay | Admitting: *Deleted

## 2013-05-19 ENCOUNTER — Ambulatory Visit (INDEPENDENT_AMBULATORY_CARE_PROVIDER_SITE_OTHER): Payer: Medicare Other | Admitting: *Deleted

## 2013-05-19 DIAGNOSIS — Z5181 Encounter for therapeutic drug level monitoring: Secondary | ICD-10-CM

## 2013-05-19 DIAGNOSIS — Z7901 Long term (current) use of anticoagulants: Secondary | ICD-10-CM

## 2013-05-19 LAB — POCT INR: INR: 1.6

## 2013-05-19 MED ORDER — FUROSEMIDE 80 MG PO TABS: 80.0000 mg | ORAL_TABLET | Freq: Every day | ORAL | Status: DC

## 2013-05-19 MED ORDER — ENALAPRIL MALEATE 5 MG PO TABS: 5.0000 mg | ORAL_TABLET | Freq: Every day | ORAL | Status: DC

## 2013-05-19 MED ORDER — PHENOBARBITAL 32.4 MG PO TABS: 97.2000 mg | ORAL_TABLET | Freq: Every day | ORAL | Status: DC

## 2013-05-19 NOTE — Telephone Encounter (Signed)
Prescription for [facility]obar[facility] per Dr. Sheffield Slider. Wyatt Haste, RN-BSN

## 2013-05-19 NOTE — Telephone Encounter (Signed)
dupli

## 2013-05-20 NOTE — Telephone Encounter (Signed)
I gave this to [facility] yesterday to fax to the pharmacy.

## 2013-05-21 NOTE — Telephone Encounter (Signed)
This encounter was created in error - please disregard.

## 2013-05-21 NOTE — Addendum Note (Signed)
Addended by: Bonita Quin E on: 05/21/2013 12:13 PM   Modules accepted: Level of Service, SmartSet

## 2013-05-21 NOTE — Telephone Encounter (Signed)
Called [facility] and confirmed that fax was received on 6/5 and that they are currently working on filling script. PLease disregard further refill request. Wyatt Haste, RN-BSN

## 2013-05-25 ENCOUNTER — Ambulatory Visit: Payer: Medicare Other | Admitting: Family Medicine

## 2013-05-31 ENCOUNTER — Encounter: Payer: Self-pay | Admitting: Internal Medicine

## 2013-06-01 ENCOUNTER — Ambulatory Visit: Payer: Medicare Other

## 2013-06-14 ENCOUNTER — Other Ambulatory Visit: Payer: Self-pay | Admitting: *Deleted

## 2013-06-14 MED ORDER — DIGOXIN 125 MCG PO TABS: 125.0000 ug | ORAL_TABLET | Freq: Every day | ORAL | Status: DC

## 2013-06-17 ENCOUNTER — Other Ambulatory Visit (HOSPITAL_COMMUNITY): Payer: Self-pay | Admitting: Family Medicine

## 2013-07-16 ENCOUNTER — Other Ambulatory Visit: Payer: Self-pay | Admitting: Family Medicine

## 2013-07-29 ENCOUNTER — Ambulatory Visit (INDEPENDENT_AMBULATORY_CARE_PROVIDER_SITE_OTHER): Payer: Medicare Other | Admitting: *Deleted

## 2013-07-29 DIAGNOSIS — I4891 Unspecified atrial fibrillation: Secondary | ICD-10-CM

## 2013-08-05 ENCOUNTER — Ambulatory Visit (INDEPENDENT_AMBULATORY_CARE_PROVIDER_SITE_OTHER): Payer: Medicare Other | Admitting: *Deleted

## 2013-08-05 DIAGNOSIS — I4891 Unspecified atrial fibrillation: Secondary | ICD-10-CM

## 2013-08-09 ENCOUNTER — Encounter: Payer: Medicare Other | Admitting: *Deleted

## 2013-08-11 ENCOUNTER — Encounter: Payer: Self-pay | Admitting: *Deleted

## 2013-08-17 ENCOUNTER — Ambulatory Visit (INDEPENDENT_AMBULATORY_CARE_PROVIDER_SITE_OTHER): Payer: Medicare Other | Admitting: *Deleted

## 2013-08-17 DIAGNOSIS — I4891 Unspecified atrial fibrillation: Secondary | ICD-10-CM

## 2013-08-17 LAB — POCT INR: INR: 4.3

## 2013-08-23 ENCOUNTER — Ambulatory Visit (INDEPENDENT_AMBULATORY_CARE_PROVIDER_SITE_OTHER): Payer: Medicare Other | Admitting: *Deleted

## 2013-08-23 DIAGNOSIS — I4891 Unspecified atrial fibrillation: Secondary | ICD-10-CM

## 2013-08-30 ENCOUNTER — Ambulatory Visit (INDEPENDENT_AMBULATORY_CARE_PROVIDER_SITE_OTHER): Payer: Medicare Other | Admitting: *Deleted

## 2013-08-30 DIAGNOSIS — I4891 Unspecified atrial fibrillation: Secondary | ICD-10-CM

## 2013-08-30 NOTE — Progress Notes (Signed)
Once again, gave pt a food sheet, hightlighted foods high in Vit K.  Explained how the chart gives a quantity amt of vit K and that she should try and be consistent on these foods.  Pt and family members are always bickering with each other about what she eats and how much.   Also made pt an appointment to meet her new doctor - 09-13-13 @ 1:30   Dewitt Hoes, MLS

## 2013-09-13 ENCOUNTER — Ambulatory Visit (INDEPENDENT_AMBULATORY_CARE_PROVIDER_SITE_OTHER): Payer: Medicare Other | Admitting: Family Medicine

## 2013-09-13 ENCOUNTER — Ambulatory Visit (INDEPENDENT_AMBULATORY_CARE_PROVIDER_SITE_OTHER): Payer: Medicare Other | Admitting: *Deleted

## 2013-09-13 ENCOUNTER — Encounter: Payer: Self-pay | Admitting: Family Medicine

## 2013-09-13 VITALS — BP 102/62 | HR 80 | Temp 98.1°F | Ht 62.0 in | Wt 231.9 lb

## 2013-09-13 DIAGNOSIS — I4891 Unspecified atrial fibrillation: Secondary | ICD-10-CM

## 2013-09-13 DIAGNOSIS — J309 Allergic rhinitis, unspecified: Secondary | ICD-10-CM

## 2013-09-13 DIAGNOSIS — I5022 Chronic systolic (congestive) heart failure: Secondary | ICD-10-CM

## 2013-09-13 DIAGNOSIS — Z23 Encounter for immunization: Secondary | ICD-10-CM

## 2013-09-13 LAB — POCT INR: INR: 2.2

## 2013-09-13 LAB — BASIC METABOLIC PANEL
BUN: 18 mg/dL (ref 6–23)
CO2: 27 mEq/L (ref 19–32)
Chloride: 105 mEq/L (ref 96–112)
Glucose, Bld: 98 mg/dL (ref 70–99)
Potassium: 4.1 mEq/L (ref 3.5–5.3)

## 2013-09-13 MED ORDER — ENALAPRIL MALEATE 5 MG PO TABS: 5.0000 mg | ORAL_TABLET | Freq: Every day | ORAL | Status: DC

## 2013-09-13 MED ORDER — FLUTICASONE PROPIONATE 50 MCG/ACT NA SUSP: 2.0000 | Freq: Every day | NASAL | Status: DC | PRN

## 2013-09-13 NOTE — Assessment & Plan Note (Signed)
Stable on current regimen of Flonase and Claritin. Refills provided.

## 2013-09-13 NOTE — Assessment & Plan Note (Signed)
Currently rate controlled. Patient is asymptomatic. On chronic anticoagulation and is due for recheck of INR today.

## 2013-09-13 NOTE — Assessment & Plan Note (Signed)
Stable today. No evidence of volume overload. Recently seen and evaluated by cardiology. Continue current management. Will prescribe compression stockings to help decrease lower extremity edema.

## 2013-09-13 NOTE — Progress Notes (Signed)
  Subjective:    Patient ID: Misty Gray, female    DOB: 1951/03/12, 62 y.o.   MRN: [id number]  HPI 62 year old female presents for followup of multiple medical problems.  Atrial fibrillation-patient is currently asymptomatic, denies chest pain or palpitations, is on daily metoprolol is tolerating the medication well, is currently on Coumadin 10 mg daily and due for INR checked today  Systolic congestive heart failure- patient reports no shortness of breath today, she does use 2 pillows to sleep at night however this is her baseline, denies PND, she does have chronic lower extremity edema secondary to her heart failure and underlying venous insufficiency, she was recently counseled by her cardiologist to decrease her dose of Lasix to 80 mg daily, her swelling has remained relatively stable, she denies chest pain, does not wear impression stockings on a regular basis to decrease lower extremity edema  Allergic rhinitis-patient needs refills of Flonase and Claritin today, states that she does have chronic rhinorrhea that is stable, denies no recent viral illnesses or increased cough and sneezing, denies shortness of breath   Reviewed patient's social history.   Review of Systems  Constitutional: Negative for fever, chills and fatigue.  Respiratory: Negative for apnea, cough and chest tightness.   Cardiovascular: Positive for leg swelling. Negative for chest pain.  Gastrointestinal: Negative for nausea, diarrhea and constipation.       Objective:   Physical Exam Vitals: Reviewed General: Pleasant Caucasian female, accompanied by her son, no acute distress HEENT, normocephalic, pupils are equal round and reactive to light, extraocular movements were intact, no scleral icterus, nasal septum midline, moist because membranes, uvula midline, no pharyngeal erythema, neck was supple, no anterior posterior cervical lymphadenopathy Cardiac: Regular rate and rhythm, no murmurs, no heaves or  thrills Respiratory: Clear to patient bilaterally, normal upper Abdomen: Soft, nontender Extremities: 2+ pitting edema in bilateral lower extremities to the level of the knee, erythema noted over bilateral anterior shins consistent with chronic venous stasis changes, difficult to palpate dorsalis pedis or posterior tibial pulses secondary to edema Skin: No foot ulcers noted       Assessment & Plan:  Please see problem specific assessment and plan.

## 2013-09-13 NOTE — Patient Instructions (Addendum)
Atrial Fibrillation - have INR checked today  Congestive Heart Failure - continue to restrict the sodium in your diet, wear compression stockings daily, check Basic Metabolic Panel, continue current medications  Allergic Rhinitis - continue Flonase and Claritin daily.   Return to office in 3 months.

## 2013-09-14 ENCOUNTER — Encounter: Payer: Self-pay | Admitting: Family Medicine

## 2013-09-22 ENCOUNTER — Other Ambulatory Visit: Payer: Self-pay

## 2013-09-22 DIAGNOSIS — Z1231 Encounter for screening mammogram for malignant neoplasm of breast: Secondary | ICD-10-CM

## 2013-09-27 ENCOUNTER — Ambulatory Visit (INDEPENDENT_AMBULATORY_CARE_PROVIDER_SITE_OTHER): Payer: Medicare Other | Admitting: *Deleted

## 2013-09-27 DIAGNOSIS — I4891 Unspecified atrial fibrillation: Secondary | ICD-10-CM

## 2013-09-27 NOTE — Progress Notes (Signed)
Pt has eaten an assortment of Vit K foods especially over last weekend.  Once again, gave the patient a third copy of the food sheet and instructed patient that she must limit the amount of Vit K foods she consumes.  Explained that varying the Vit K food intake, prohibits [place] from being able to determine the therapeutic dose for her warfarin. Explained we constantly have to increase/decrease her warfarin dose because she is so inconsistent with what she eats.  Asked pt to only eat one piece of lettuce on a sandwich and no other vit K foods.  Pt and her son constantly bicker about who is responsible for what she is eating.  Dewitt Hoes, MLS

## 2013-10-04 ENCOUNTER — Ambulatory Visit (INDEPENDENT_AMBULATORY_CARE_PROVIDER_SITE_OTHER): Payer: Medicare Other | Admitting: *Deleted

## 2013-10-04 DIAGNOSIS — I4891 Unspecified atrial fibrillation: Secondary | ICD-10-CM

## 2013-10-04 LAB — POCT INR: INR: 1.8

## 2013-10-08 ENCOUNTER — Ambulatory Visit (INDEPENDENT_AMBULATORY_CARE_PROVIDER_SITE_OTHER): Payer: Medicare Other | Admitting: Cardiology

## 2013-10-08 ENCOUNTER — Encounter: Payer: Self-pay | Admitting: Cardiology

## 2013-10-08 VITALS — BP 96/60 | HR 57 | Ht 62.0 in | Wt 217.0 lb

## 2013-10-08 DIAGNOSIS — I5022 Chronic systolic (congestive) heart failure: Secondary | ICD-10-CM

## 2013-10-08 DIAGNOSIS — I428 Other cardiomyopathies: Secondary | ICD-10-CM

## 2013-10-08 DIAGNOSIS — Z950 Presence of cardiac pacemaker: Secondary | ICD-10-CM

## 2013-10-08 DIAGNOSIS — R001 Bradycardia, unspecified: Secondary | ICD-10-CM

## 2013-10-08 DIAGNOSIS — I4891 Unspecified atrial fibrillation: Secondary | ICD-10-CM

## 2013-10-08 DIAGNOSIS — I498 Other specified cardiac arrhythmias: Secondary | ICD-10-CM

## 2013-10-08 LAB — PACEMAKER DEVICE OBSERVATION
BATTERY VOLTAGE: 2.95 V
BRDY-0002RV: 70 {beats}/min
BRDY-0004RV: 120 {beats}/min
DEVICE MODEL PM: 7254513
RV LEAD AMPLITUDE: 7.7 mv
RV LEAD THRESHOLD: 1.25 V
VENTRICULAR PACING PM: 84

## 2013-10-08 NOTE — Patient Instructions (Signed)
Your physician recommends that you schedule a follow-up appointment in: 3 MONTHS REMOTE PACER CHECK 01-10-2013  Your physician wants you to follow-up in: 1 YEAR FOLLOW UP WITH BROOKE EDMISTEN PA-C You will receive a reminder letter in the mail two months in advance. If you don't receive a letter, please call our office to schedule the follow-up appointment.

## 2013-10-08 NOTE — Progress Notes (Signed)
ELECTROPHYSIOLOGY OFFICE NOTE  Patient ID: Misty Gray MRN: [id number], DOB/AGE: [birth year]   Date of Visit: 10/08/2013  Primary Physician: Misty Rising, MD Primary Cardiologist: Misty Frame, MD Reason for Visit: EP/device follow-up  History of Present Illness  Misty Gray is a 62 y.o. female with prior NICM, sinus node dysfunction s/p PPM, permanent AFib, dyslipidemia, prior CVA and seizure disorder who presents today for routine device follow-up.   Since last being seen in our clinic, she reports she is doing well and has no complaints. She denies chest pain or worsening shortness of breath. She denies palpitations, dizziness, near syncope or syncope. She denies LE swelling, orthopnea, PND or recent weight gain. She is compliant and tolerating medications without difficulty.  Past Medical History Past Medical History  Diagnosis Date  . Abnormal CT of the head 12/16/1984    R parietal atrophy  . Nonischemic cardiomyopathy     EF has normalized-repeat Pending   . Hypertension   . GERD (gastroesophageal reflux disease)   . Unspecified venous (peripheral) insufficiency   . Diaphragmatic hernia without mention of obstruction or gangrene   . Female stress incontinence   . Allergic rhinitis, cause unspecified   . Morbid obesity   . Lichenification and lichen simplex chronicus   . Pacemaker  st judes   . Heart murmur   . CHF (congestive heart failure)   . H/O hiatal hernia     two removed  . Anemia   . High cholesterol   . Pneumonia 2004; 2011; 11/2011  . Sleep apnea, obstructive     CPAP  . Exertional dyspnea 06/22/12  . Migraine, unspecified, without mention of intractable migraine without mention of status migrainosus     "when I was a teenager"  . Seizures     "can hear you talking but sounds like you are in big tunnel; have them often if not taking RX; last one was 06/17/12" (06/22/12)  . Stroke 1988    "mouth drawed real bad on my left side"  . Sick sinus syndrome     WITH  PRIOR DDD PACEMAKER IMPLANTATION  . Atrial fibrillation   . Syncope and collapse 06/22/12    "hit forehead and left knee"; denies loss of consciousness  . Arthritis     "hands and knees"    Past Surgical History Past Surgical History  Procedure Laterality Date  . Leg skin lesion  biopsy / excision  05/16/2001    Lichen Planus  . Hernia repair  ? 1988; 04/15/1998    ventral  . Insert / replace / remove pacemaker  12/16/1990    DDD EF 30%;  Marland Kitchen Insert / replace / remove pacemaker  07/25/2003    replaced by BB, leads are both IS1 and from 1992  . Insert / replace / remove pacemaker  05/21/2011    JA; generator change    Allergies/Intolerances Allergies  Allergen Reactions  . Aspirin Hives  . Penicillins Rash and Other (See Comments)    "doesn't do the job it's suppose to do; I get real hyperactive and I break out in a rash"    Current Home Medications Current Outpatient Prescriptions  Medication Sig Dispense Refill  . Cholecalciferol (VITAMIN D3) 5000 UNITS CHEW Chew 1 tablet by mouth 2 (two) times a week.      . digoxin (LANOXIN) 0.125 MG tablet Take 1 tablet (125 mcg total) by mouth daily.  90 tablet  3  . enalapril (VASOTEC) 5 MG tablet Take 1  tablet (5 mg total) by mouth daily.  60 tablet  3  . fluticasone (FLONASE) 50 MCG/ACT nasal spray Place 2 sprays into the nose daily as needed. For congestion.  16 g  1  . furosemide (LASIX) 80 MG tablet Take 1 tablet (80 mg total) by mouth daily.  90 tablet  3  . halobetasol (ULTRAVATE) 0.05 % cream Apply topically 2 (two) times daily.      Marland Kitchen loratadine (CLARITIN) 10 MG tablet Take 1 tablet (10 mg total) by mouth daily.  30 tablet  11  . metoprolol succinate (TOPROL-XL) 100 MG 24 hr tablet Take 1 tablet (100 mg total) by mouth daily. Take with or immediately following a meal.  90 tablet  3  . omeprazole (PRILOSEC) 20 MG capsule TAKE 1 CAPSULE BY MOUTH DAILY  30 capsule  5  . PHENobarbital (LUMINAL) 32.4 MG tablet Take 3 tablets (97.2 mg total) by  mouth at bedtime.      Marland Kitchen spironolactone (ALDACTONE) 25 MG tablet Take 1 tablet (25 mg total) by mouth daily.  90 tablet  3  . warfarin (COUMADIN) 5 MG tablet Take 10-15 mg by mouth daily. Take 3 tablets on Fri, take 2 tablets all other days      . warfarin (COUMADIN) 5 MG tablet Take 1 tablet (5 mg total) by mouth as directed.  60 tablet  4   No current facility-administered medications for this visit.    Social History Social History  . Marital Status: Married   Social History Main Topics  . Smoking status: Former Smoker -- 1.00 packs/day    Types: Cigarettes    Quit date: 03/16/1969  . Smokeless tobacco: Never Used  . Alcohol Use: No  . Drug Use: No   Review of Systems General: No chills, fever, night sweats or weight changes Cardiovascular: No chest pain, dyspnea on exertion, edema, orthopnea, palpitations, paroxysmal nocturnal dyspnea Dermatological: No rash, lesions or masses Respiratory: No cough, dyspnea Urologic: No hematuria, dysuria Abdominal: No nausea, vomiting, diarrhea, bright red blood per rectum, melena, or hematemesis Neurologic: No visual changes, weakness, changes in mental status All other systems reviewed and are otherwise negative except as noted above.  Physical Exam Vitals: Blood pressure 96/60, pulse 57, height 5\' 2"  (1.575 m), weight 217 lb (98.431 kg), SpO2 98.00%.  General: Well developed, well appearing 62 y.o. female in no acute distress. HEENT: Normocephalic, atraumatic. EOMs intact. Sclera nonicteric. Oropharynx clear.  Neck: Supple. No JVD. Lungs: Respirations regular and unlabored, CTA bilaterally. No wheezes, rales or rhonchi. Heart: RRR. S1, S2 present. No murmurs, rub, S3 or S4. Abdomen: Soft, non-distended.   Extremities: No clubbing, cyanosis or edema. PT/Radials 2+ and equal bilaterally. Psych: Normal affect. Neuro: Alert and oriented X 3. Moves all extremities spontaneously. Skin: Pacemaker implant site well healed.    Diagnostics Device interrogation today - Normal device function. Threshold, sensing, impedance consistent with previous measurements. Device programmed to maximize longevity. No high ventricular rates noted. Device programmed at appropriate safety margins. Histogram distribution appropriate for patient activity level. Device programmed to optimize intrinsic conduction. Estimated longevity 6.2 years.  Assessment and Plan 1. Bradycardia s/p PPM implant Normal pacemaker function  No changes today  Continue remote PPM follow-up every 3 months Return to see me or Dr. Johney Gray in one year  2. AFib Asymptomatic and rate controlled Continue Coumadin At her visit Oct 2013, Dr. Johney Gray offered cardioversion. She is clear that she does not wish to proceed with cardioversion. Given her absence of  symptoms, this is reasonable. Therefore it was decided to continue rate control long term at that visit.  3. NICM with chronic systolic HF Stable without worsening HF symptoms Continue medical therapy Continue metoprolol succinate and ACEI (of note, enalapril and spironolactone were decreased last year due to hypotension and orthostasis)  Continue low sodium diet Counseled regarding the importance of compliance Consider upgrading her device to CRT if HF worsens; however given her preference to avoid hospitalization / procedures we will defer this presently  Signed, Bhakti Labella, PA-C 10/08/2013, 11:14 AM

## 2013-10-11 ENCOUNTER — Ambulatory Visit: Payer: Medicare Other

## 2013-10-18 ENCOUNTER — Ambulatory Visit (INDEPENDENT_AMBULATORY_CARE_PROVIDER_SITE_OTHER): Payer: Medicare Other | Admitting: *Deleted

## 2013-10-18 DIAGNOSIS — I4891 Unspecified atrial fibrillation: Secondary | ICD-10-CM

## 2013-10-21 ENCOUNTER — Other Ambulatory Visit: Payer: Self-pay | Admitting: Family Medicine

## 2013-10-28 ENCOUNTER — Encounter: Payer: Self-pay | Admitting: Internal Medicine

## 2013-10-28 ENCOUNTER — Other Ambulatory Visit: Payer: Self-pay | Admitting: Family Medicine

## 2013-11-01 ENCOUNTER — Ambulatory Visit (INDEPENDENT_AMBULATORY_CARE_PROVIDER_SITE_OTHER): Payer: Medicare Other | Admitting: *Deleted

## 2013-11-01 DIAGNOSIS — I4891 Unspecified atrial fibrillation: Secondary | ICD-10-CM

## 2013-11-06 ENCOUNTER — Other Ambulatory Visit: Payer: Self-pay | Admitting: Family Medicine

## 2013-11-09 ENCOUNTER — Other Ambulatory Visit: Payer: Self-pay | Admitting: Family Medicine

## 2013-11-18 ENCOUNTER — Ambulatory Visit: Payer: Medicare Other

## 2013-11-19 ENCOUNTER — Other Ambulatory Visit: Payer: Self-pay | Admitting: Family Medicine

## 2013-11-23 ENCOUNTER — Telehealth: Payer: Self-pay | Admitting: Family Medicine

## 2013-11-23 NOTE — Telephone Encounter (Signed)
Will fwd to PCP.  Madiha Bambrick L, CMA  

## 2013-11-23 NOTE — Telephone Encounter (Signed)
Pt is calling and would like a refill on her Phenobarbital sent to her pharmacy. jw

## 2013-11-24 ENCOUNTER — Telehealth: Payer: Self-pay | Admitting: *Deleted

## 2013-11-24 MED ORDER — PHENOBARBITAL 32.4 MG PO TABS: ORAL_TABLET | ORAL | Status: DC

## 2013-11-24 NOTE — Telephone Encounter (Signed)
[facility] called and [facility] not [facility] on [facility]bital.  I see that MD approved refill but the escribe states "print", not "normal", which means it did not go through.  [facility] called [facility], [address], and verbalized Phenobarbital refill on voice mail.  Janey Petron, Darlyne Russian, CMA

## 2013-11-24 NOTE — Telephone Encounter (Signed)
Please call patient to let her know that medication has been sent in.

## 2013-11-24 NOTE — Telephone Encounter (Signed)
Called pt. Informed. .Misty Gray  

## 2013-12-01 ENCOUNTER — Ambulatory Visit: Payer: Medicare Other | Admitting: *Deleted

## 2013-12-01 DIAGNOSIS — Z7901 Long term (current) use of anticoagulants: Secondary | ICD-10-CM

## 2013-12-01 DIAGNOSIS — I4891 Unspecified atrial fibrillation: Secondary | ICD-10-CM

## 2013-12-01 LAB — POCT INR: INR: >8

## 2013-12-01 LAB — PROTIME-INR
INR: 7.97 (ref <1.50)
Prothrombin Time: 63.4 seconds — ABNORMAL HIGH (ref 11.6–15.2)

## 2013-12-01 MED ORDER — PHYTONADIONE 5 MG PO TABS
5.0000 mg | ORAL_TABLET | Freq: Once | ORAL | Status: AC
  Administered 2013-12-01: 5 mg via ORAL

## 2013-12-03 ENCOUNTER — Ambulatory Visit (INDEPENDENT_AMBULATORY_CARE_PROVIDER_SITE_OTHER): Payer: Medicare Other | Admitting: *Deleted

## 2013-12-03 DIAGNOSIS — I4891 Unspecified atrial fibrillation: Secondary | ICD-10-CM

## 2013-12-08 ENCOUNTER — Ambulatory Visit (INDEPENDENT_AMBULATORY_CARE_PROVIDER_SITE_OTHER): Payer: Medicare Other | Admitting: *Deleted

## 2013-12-08 DIAGNOSIS — I4891 Unspecified atrial fibrillation: Secondary | ICD-10-CM

## 2013-12-08 LAB — POCT INR: INR: 2.9

## 2013-12-15 ENCOUNTER — Ambulatory Visit (INDEPENDENT_AMBULATORY_CARE_PROVIDER_SITE_OTHER): Payer: Medicare Other | Admitting: *Deleted

## 2013-12-15 DIAGNOSIS — I4891 Unspecified atrial fibrillation: Secondary | ICD-10-CM

## 2013-12-21 ENCOUNTER — Telehealth: Payer: Self-pay | Admitting: Family Medicine

## 2013-12-21 NOTE — Telephone Encounter (Signed)
Will fwd. To PCP for refill. .Oviya Ammar  

## 2013-12-21 NOTE — Telephone Encounter (Signed)
Refill request for Digoxin, Prilosec and Coumadin.

## 2013-12-22 ENCOUNTER — Other Ambulatory Visit: Payer: Self-pay | Admitting: Family Medicine

## 2013-12-22 MED ORDER — DIGOXIN 125 MCG PO TABS: 125.0000 ug | ORAL_TABLET | Freq: Every day | ORAL | Status: DC

## 2013-12-22 MED ORDER — OMEPRAZOLE 20 MG PO CPDR: 20.0000 mg | DELAYED_RELEASE_CAPSULE | Freq: Every day | ORAL | Status: DC

## 2013-12-22 NOTE — Telephone Encounter (Signed)
Called and informed Ms Okuma that her meds were sent to her pharmacy.Busick, Rodena Medin

## 2013-12-22 NOTE — Telephone Encounter (Signed)
Pt is aware.  Desarie Feild,CMA  

## 2013-12-22 NOTE — Telephone Encounter (Signed)
Needs refills on wartarin (out now)

## 2013-12-22 NOTE — Telephone Encounter (Signed)
Refill sent to pharmacy, please contact patient.

## 2013-12-24 ENCOUNTER — Ambulatory Visit (INDEPENDENT_AMBULATORY_CARE_PROVIDER_SITE_OTHER): Payer: Medicare Other | Admitting: *Deleted

## 2013-12-24 DIAGNOSIS — I4891 Unspecified atrial fibrillation: Secondary | ICD-10-CM

## 2013-12-24 LAB — POCT INR: INR: 1.5

## 2014-01-04 ENCOUNTER — Ambulatory Visit (INDEPENDENT_AMBULATORY_CARE_PROVIDER_SITE_OTHER): Payer: Medicare Other | Admitting: *Deleted

## 2014-01-04 ENCOUNTER — Encounter: Payer: Self-pay | Admitting: Family Medicine

## 2014-01-04 ENCOUNTER — Ambulatory Visit (INDEPENDENT_AMBULATORY_CARE_PROVIDER_SITE_OTHER): Payer: Medicare Other | Admitting: Family Medicine

## 2014-01-04 VITALS — BP 116/73 | HR 80 | Ht 62.0 in | Wt 219.0 lb

## 2014-01-04 DIAGNOSIS — I4891 Unspecified atrial fibrillation: Secondary | ICD-10-CM

## 2014-01-04 DIAGNOSIS — I10 Essential (primary) hypertension: Secondary | ICD-10-CM | POA: Insufficient documentation

## 2014-01-04 DIAGNOSIS — K219 Gastro-esophageal reflux disease without esophagitis: Secondary | ICD-10-CM

## 2014-01-04 DIAGNOSIS — I8310 Varicose veins of unspecified lower extremity with inflammation: Secondary | ICD-10-CM

## 2014-01-04 DIAGNOSIS — E559 Vitamin D deficiency, unspecified: Secondary | ICD-10-CM

## 2014-01-04 DIAGNOSIS — R569 Unspecified convulsions: Secondary | ICD-10-CM

## 2014-01-04 DIAGNOSIS — I5022 Chronic systolic (congestive) heart failure: Secondary | ICD-10-CM

## 2014-01-04 DIAGNOSIS — I872 Venous insufficiency (chronic) (peripheral): Secondary | ICD-10-CM | POA: Insufficient documentation

## 2014-01-04 LAB — CBC WITH DIFFERENTIAL/PLATELET
BASOS PCT: 0 % (ref 0–1)
Basophils Absolute: 0 10*3/uL (ref 0.0–0.1)
EOS ABS: 0.1 10*3/uL (ref 0.0–0.7)
EOS PCT: 2 % (ref 0–5)
HCT: 42.3 % (ref 36.0–46.0)
Hemoglobin: 14.3 g/dL (ref 12.0–15.0)
Lymphocytes Relative: 26 % (ref 12–46)
Lymphs Abs: 1.9 10*3/uL (ref 0.7–4.0)
MCH: 30.8 pg (ref 26.0–34.0)
MCHC: 33.8 g/dL (ref 30.0–36.0)
MCV: 91 fL (ref 78.0–100.0)
MONOS PCT: 9 % (ref 3–12)
Monocytes Absolute: 0.6 10*3/uL (ref 0.1–1.0)
NEUTROS PCT: 63 % (ref 43–77)
Neutro Abs: 4.5 10*3/uL (ref 1.7–7.7)
Platelets: 166 10*3/uL (ref 150–400)
RBC: 4.65 MIL/uL (ref 3.87–5.11)
RDW: 14.9 % (ref 11.5–15.5)
WBC: 7.1 10*3/uL (ref 4.0–10.5)

## 2014-01-04 LAB — BASIC METABOLIC PANEL
BUN: 27 mg/dL — AB (ref 6–23)
CHLORIDE: 100 meq/L (ref 96–112)
CO2: 30 meq/L (ref 19–32)
CREATININE: 0.67 mg/dL (ref 0.50–1.10)
Calcium: 9.4 mg/dL (ref 8.4–10.5)
Glucose, Bld: 98 mg/dL (ref 70–99)
POTASSIUM: 4.2 meq/L (ref 3.5–5.3)
Sodium: 137 mEq/L (ref 135–145)

## 2014-01-04 LAB — LIPID PANEL
CHOLESTEROL: 170 mg/dL (ref 0–200)
HDL: 58 mg/dL (ref >39)
LDL Cholesterol: 99 mg/dL (ref 0–99)
TRIGLYCERIDES: 67 mg/dL (ref <150)
Total CHOL/HDL Ratio: 2.9 Ratio
VLDL: 13 mg/dL (ref 0–40)

## 2014-01-04 LAB — POCT INR: INR: 1.9

## 2014-01-04 NOTE — Assessment & Plan Note (Addendum)
Patient counseled to continue home vitamin D.

## 2014-01-04 NOTE — Assessment & Plan Note (Signed)
Controlled on current regimen of omeprazole. Refill provided.

## 2014-01-04 NOTE — Assessment & Plan Note (Signed)
Rate controlled on Toprol. INR slightly subtherapeutic today at 1.9. -Will continue current Coumadin regimen of 15 mg 3 days a week and 10 mg other days

## 2014-01-04 NOTE — Progress Notes (Addendum)
   Subjective:    Patient ID: Misty Gray, female    DOB: [birth year], 63 y.o.   MRN: [id number]  HPI 63 year old female since for followup of multiple medical conditions. She is accompanied today by her son.  Atrial fibrillation-patient currently on chronic anticoagulation with Coumadin, INR checked today, patient currently on Coumadin 15 mg 3 days a week and 10 mg other days of the week, she denies palpitations or chest pain, she is also currently on metoprolol for rate control  Systolic congestive heart failure -patient has upcoming appointment with her cardiologist, she reports chronic lower extremity edema that is stable from previous, no increased work or breathing, no chest pain, patient does not wear compression stockings on a regular basis, she would like refill provided today, she does apply steroid cream over lower extremities to help with venous stasis dermatitis  Hypertension-patient currently on metoprolol, spironolactone, and enalapril. Reports good medication compliance, She does report very infrequent orthostatic hypotension with standing  Acid reflux-patient states that her symptoms are controlled with Prilosec daily, she does need refill of this medication  Seizure disorder-no recent seizure activity, patient stable on phenobarbital  Social-former smoker     Review of Systems  Constitutional: Negative for fever, chills and fatigue.  Respiratory: Negative for cough and choking.   Cardiovascular: Positive for leg swelling. Negative for chest pain and palpitations.  Gastrointestinal: Negative for nausea, vomiting, abdominal pain, diarrhea and abdominal distention.  Skin: Positive for color change. Negative for pallor.       Objective:   Physical Exam Vitals: Reviewed General: Pleasant Caucasian female, no acute distress, accompanied by her son HEENT: Pupils equal round and reactive to light, extraocular which are intact, no scleral icterus, moist mucous members,  neck was supple, no anterior posterior cervical lymphadenopathy Cardiac: Regular regular rate and rhythm, no murmurs Respiratory: Clear to auscultation bilaterally Abdomen: Vertical scar present from previous abdominal surgery, soft, nontender, bowel sounds present, no rebound, no guarding Extremities: 1+ edema to mid shin in bilateral lower extremities Skin: Erythema present over anterior shins consistent with venous stasis dermatitis       Assessment & Plan:  Please see problem specific assessment and plan.

## 2014-01-04 NOTE — Assessment & Plan Note (Signed)
Patient has been seizure-free since previous visit. Continue current dose of phenobarbital.

## 2014-01-04 NOTE — Assessment & Plan Note (Signed)
Blood pressure well controlled. Patient has infrequent episodes of orthostatic hypotension. -Given other chronic medical conditions we'll not make any changes in blood pressure medications at this time this patient has been stable for sometime.

## 2014-01-04 NOTE — Patient Instructions (Signed)
Atrial Fibrillation - INR today 1.9, continue current dose of Coumadin 15 mg M/W/F and 10 mg other day, keep upcoming appointment with cardiology  Heart Failure/Leg swelling - start to wear compression stockings daily, continue lasix, check lab work today  Blood pressure well controlled on current regimen  Seizures - controlled on current of Phenobarbital  Return to office 3 month for yearly physical/preventative exam.

## 2014-01-04 NOTE — Assessment & Plan Note (Signed)
Stasis dermatitis of bilateral leg secondary to congestive heart failure. -Compression stocking prescription provided -Patient to continue steroid cream when necessary

## 2014-01-04 NOTE — Assessment & Plan Note (Signed)
Stable today without evidence of volume overload. Patient counseled to continue to see her cardiologist on regular basis. -No changes to current pharmacotherapy made today

## 2014-01-05 ENCOUNTER — Telehealth: Payer: Self-pay | Admitting: Family Medicine

## 2014-01-09 ENCOUNTER — Inpatient Hospital Stay (HOSPITAL_COMMUNITY)
Admission: EM | Admit: 2014-01-09 | Discharge: 2014-01-12 | DRG: 152 | Disposition: A | Discharge status: Home / Self Care | Payer: Medicare Other | Attending: Family Medicine | Admitting: Family Medicine

## 2014-01-09 ENCOUNTER — Emergency Department (HOSPITAL_COMMUNITY): Payer: Medicare Other

## 2014-01-09 ENCOUNTER — Encounter (HOSPITAL_COMMUNITY): Payer: Self-pay | Admitting: Emergency Medicine

## 2014-01-09 DIAGNOSIS — K219 Gastro-esophageal reflux disease without esophagitis: Secondary | ICD-10-CM | POA: Diagnosis present

## 2014-01-09 DIAGNOSIS — I5043 Acute on chronic combined systolic (congestive) and diastolic (congestive) heart failure: Secondary | ICD-10-CM | POA: Diagnosis present

## 2014-01-09 DIAGNOSIS — I509 Heart failure, unspecified: Secondary | ICD-10-CM | POA: Diagnosis present

## 2014-01-09 DIAGNOSIS — I10 Essential (primary) hypertension: Secondary | ICD-10-CM

## 2014-01-09 DIAGNOSIS — Z8673 Personal history of transient ischemic attack (TIA), and cerebral infarction without residual deficits: Secondary | ICD-10-CM

## 2014-01-09 DIAGNOSIS — I428 Other cardiomyopathies: Secondary | ICD-10-CM

## 2014-01-09 DIAGNOSIS — G4733 Obstructive sleep apnea (adult) (pediatric): Secondary | ICD-10-CM | POA: Diagnosis present

## 2014-01-09 DIAGNOSIS — R509 Fever, unspecified: Secondary | ICD-10-CM

## 2014-01-09 DIAGNOSIS — E7800 Pure hypercholesterolemia, unspecified: Secondary | ICD-10-CM | POA: Diagnosis present

## 2014-01-09 DIAGNOSIS — Z79899 Other long term (current) drug therapy: Secondary | ICD-10-CM

## 2014-01-09 DIAGNOSIS — Z950 Presence of cardiac pacemaker: Secondary | ICD-10-CM

## 2014-01-09 DIAGNOSIS — Z87891 Personal history of nicotine dependence: Secondary | ICD-10-CM

## 2014-01-09 DIAGNOSIS — I4891 Unspecified atrial fibrillation: Secondary | ICD-10-CM

## 2014-01-09 DIAGNOSIS — Z7901 Long term (current) use of anticoagulants: Secondary | ICD-10-CM

## 2014-01-09 DIAGNOSIS — I5022 Chronic systolic (congestive) heart failure: Secondary | ICD-10-CM

## 2014-01-09 DIAGNOSIS — I495 Sick sinus syndrome: Secondary | ICD-10-CM | POA: Diagnosis present

## 2014-01-09 DIAGNOSIS — J101 Influenza due to other identified influenza virus with other respiratory manifestations: Secondary | ICD-10-CM

## 2014-01-09 DIAGNOSIS — I519 Heart disease, unspecified: Secondary | ICD-10-CM

## 2014-01-09 DIAGNOSIS — R079 Chest pain, unspecified: Secondary | ICD-10-CM

## 2014-01-09 DIAGNOSIS — Z6839 Body mass index (BMI) 39.0-39.9, adult: Secondary | ICD-10-CM

## 2014-01-09 DIAGNOSIS — G40909 Epilepsy, unspecified, not intractable, without status epilepticus: Secondary | ICD-10-CM | POA: Diagnosis present

## 2014-01-09 DIAGNOSIS — Z886 Allergy status to analgesic agent status: Secondary | ICD-10-CM

## 2014-01-09 DIAGNOSIS — I059 Rheumatic mitral valve disease, unspecified: Secondary | ICD-10-CM | POA: Diagnosis present

## 2014-01-09 DIAGNOSIS — J111 Influenza due to unidentified influenza virus with other respiratory manifestations: Principal | ICD-10-CM | POA: Diagnosis present

## 2014-01-09 DIAGNOSIS — I5023 Acute on chronic systolic (congestive) heart failure: Secondary | ICD-10-CM | POA: Diagnosis present

## 2014-01-09 HISTORY — DX: Influenza due to other identified influenza virus with other respiratory manifestations: J10.1

## 2014-01-09 LAB — COMPREHENSIVE METABOLIC PANEL
ALT: 41 U/L — AB (ref 0–35)
AST: 59 U/L — ABNORMAL HIGH (ref 0–37)
Albumin: 3.6 g/dL (ref 3.5–5.2)
Alkaline Phosphatase: 107 U/L (ref 39–117)
BILIRUBIN TOTAL: 0.7 mg/dL (ref 0.3–1.2)
BUN: 23 mg/dL (ref 6–23)
CALCIUM: 9.1 mg/dL (ref 8.4–10.5)
CHLORIDE: 99 meq/L (ref 96–112)
CO2: 24 mEq/L (ref 19–32)
Creatinine, Ser: 0.77 mg/dL (ref 0.50–1.10)
GFR, EST NON AFRICAN AMERICAN: 87 mL/min — AB (ref >90)
GLUCOSE: 102 mg/dL — AB (ref 70–99)
Potassium: 4.1 mEq/L (ref 3.7–5.3)
Sodium: 137 mEq/L (ref 137–147)
Total Protein: 7.5 g/dL (ref 6.0–8.3)

## 2014-01-09 LAB — POCT I-STAT TROPONIN I: Troponin i, poc: 0.01 ng/mL (ref 0.00–0.08)

## 2014-01-09 LAB — PROTIME-INR
INR: 3.55 — ABNORMAL HIGH (ref 0.00–1.49)
Prothrombin Time: 34.2 seconds — ABNORMAL HIGH (ref 11.6–15.2)

## 2014-01-09 LAB — CBC
HCT: 43.5 % (ref 36.0–46.0)
Hemoglobin: 14.5 g/dL (ref 12.0–15.0)
MCH: 31.5 pg (ref 26.0–34.0)
MCHC: 33.3 g/dL (ref 30.0–36.0)
MCV: 94.6 fL (ref 78.0–100.0)
Platelets: 131 10*3/uL — ABNORMAL LOW (ref 150–400)
RBC: 4.6 MIL/uL (ref 3.87–5.11)
RDW: 15.1 % (ref 11.5–15.5)
WBC: 6.4 10*3/uL (ref 4.0–10.5)

## 2014-01-09 LAB — PRO B NATRIURETIC PEPTIDE: PRO B NATRI PEPTIDE: 4390 pg/mL — AB (ref 0–125)

## 2014-01-09 LAB — DIGOXIN LEVEL: Digoxin Level: 0.6 ng/mL — ABNORMAL LOW (ref 0.8–2.0)

## 2014-01-09 MED ORDER — FUROSEMIDE 10 MG/ML IJ SOLN
40.0000 mg | Freq: Once | INTRAMUSCULAR | Status: AC
  Administered 2014-01-09: 40 mg via INTRAVENOUS
  Filled 2014-01-09: qty 4

## 2014-01-09 MED ORDER — PANTOPRAZOLE SODIUM 40 MG PO TBEC
40.0000 mg | DELAYED_RELEASE_TABLET | Freq: Every day | ORAL | Status: DC
  Administered 2014-01-10 – 2014-01-12 (×3): 40 mg via ORAL
  Filled 2014-01-09 (×2): qty 1

## 2014-01-09 MED ORDER — WARFARIN - PHARMACIST DOSING INPATIENT: Freq: Every day | Status: DC

## 2014-01-09 MED ORDER — PHENOBARBITAL 97.2 MG PO TABS
97.2000 mg | ORAL_TABLET | Freq: Every day | ORAL | Status: DC
  Administered 2014-01-09 – 2014-01-11 (×3): 97.2 mg via ORAL
  Filled 2014-01-09 (×3): qty 1

## 2014-01-09 MED ORDER — ACETAMINOPHEN 325 MG PO TABS
650.0000 mg | ORAL_TABLET | ORAL | Status: DC | PRN
  Administered 2014-01-10 – 2014-01-12 (×4): 650 mg via ORAL
  Filled 2014-01-09 (×4): qty 2

## 2014-01-09 MED ORDER — SPIRONOLACTONE 25 MG PO TABS
25.0000 mg | ORAL_TABLET | Freq: Every day | ORAL | Status: DC
  Administered 2014-01-10 – 2014-01-12 (×3): 25 mg via ORAL
  Filled 2014-01-09 (×3): qty 1

## 2014-01-09 MED ORDER — METOPROLOL SUCCINATE ER 100 MG PO TB24
100.0000 mg | ORAL_TABLET | Freq: Every day | ORAL | Status: DC
  Filled 2014-01-09: qty 1

## 2014-01-09 MED ORDER — HEPARIN SODIUM (PORCINE) 5000 UNIT/ML IJ SOLN: 5000.0000 [IU] | Freq: Three times a day (TID) | INTRAMUSCULAR | Status: DC

## 2014-01-09 MED ORDER — MORPHINE SULFATE 2 MG/ML IJ SOLN: 2.0000 mg | INTRAMUSCULAR | Status: DC | PRN

## 2014-01-09 MED ORDER — ACETAMINOPHEN 325 MG PO TABS
650.0000 mg | ORAL_TABLET | Freq: Once | ORAL | Status: AC
  Administered 2014-01-09: 650 mg via ORAL
  Filled 2014-01-09: qty 2

## 2014-01-09 MED ORDER — ENALAPRIL MALEATE 5 MG PO TABS
5.0000 mg | ORAL_TABLET | Freq: Every day | ORAL | Status: DC
  Administered 2014-01-11: 5 mg via ORAL
  Filled 2014-01-09 (×3): qty 1

## 2014-01-09 MED ORDER — DIGOXIN 125 MCG PO TABS
125.0000 ug | ORAL_TABLET | Freq: Every day | ORAL | Status: DC
  Administered 2014-01-10 – 2014-01-12 (×3): 125 ug via ORAL
  Filled 2014-01-09 (×3): qty 1

## 2014-01-09 MED ORDER — ONDANSETRON HCL 4 MG/2ML IJ SOLN: 4.0000 mg | Freq: Four times a day (QID) | INTRAMUSCULAR | Status: DC | PRN

## 2014-01-09 NOTE — ED Provider Notes (Signed)
CSN: [id number]     Arrival date & time 01/09/14  1453 History   First MD Initiated Contact with Patient 01/09/14 1633     Chief Complaint  Patient presents with  . Cough  . Chest Pain    HPI: Misty Gray is a 63 yo F with history of CHF, last EF 35%, OSA, HTN, atrial fibrillation on coumadin, sick sinus sinus syndrome s/p pacemaker placement, who presents with chest pain, shortness of breath, cough and chills. Symptoms started around 2 pm. About one hour earlier she had taken an aspirin containing product which she is allergic to. She started coughing last night, mostly productive of clear sputum but occasionally there is yellow sputum. Her cough has been persistent. Today while walking home from church she experienced substernal chest pain she describes as like an elephant is sitting on my chest. She was short of breath and felt "smothered." She had to stop several times. Pain was relieved with resting. Pain was not pleuritic. She also endorses feeling cold but no fever. Her orthopnea is stable at 2 pillows, no worsening of her lower extremity edema. She is concerned she may have had an allergic reaction although she had not rash, diarrhea or vomiting.    Past Medical History  Diagnosis Date  . Abnormal CT of the head 12/16/1984    R parietal atrophy  . Nonischemic cardiomyopathy     EF has normalized-repeat Pending   . Hypertension   . GERD (gastroesophageal reflux disease)   . Unspecified venous (peripheral) insufficiency   . Diaphragmatic hernia without mention of obstruction or gangrene   . Female stress incontinence   . Allergic rhinitis, cause unspecified   . Morbid obesity   . Lichenification and lichen simplex chronicus   . Pacemaker  st judes   . Heart murmur   . CHF (congestive heart failure)   . H/O hiatal hernia     two removed  . Anemia   . High cholesterol   . Pneumonia 2004; 2011; 11/2011  . Sleep apnea, obstructive     CPAP  . Exertional dyspnea 06/22/12  . Migraine,  unspecified, without mention of intractable migraine without mention of status migrainosus     "when I was a teenager"  . Seizures     "can hear you talking but sounds like you are in big tunnel; have them often if not taking RX; last one was 06/17/12" (06/22/12)  . Stroke 1988    "mouth drawed real bad on my left side"  . Sick sinus syndrome     WITH PRIOR DDD PACEMAKER IMPLANTATION  . Atrial fibrillation   . Syncope and collapse 06/22/12    "hit forehead and left knee"; denies loss of consciousness  . Arthritis     "hands and knees"   Past Surgical History  Procedure Laterality Date  . Leg skin lesion  biopsy / excision  05/16/2001    Lichen Planus  . Hernia repair  ? 1988; 04/15/1998    ventral  . Insert / replace / remove pacemaker  12/16/1990    DDD EF 30%;  Marland Kitchen. Insert / replace / remove pacemaker  07/25/2003    replaced by BB, leads are both IS1 and from 1992  . Insert / replace / remove pacemaker  05/21/2011    JA; generator change   Family History  Problem Relation Age of Onset  . Stroke Father     died from CAD?  Marland Kitchen. Cancer Mother     melanoma  .  Diabetes Son     Type 1  . Sleep apnea Son   . Cancer Sister     cervical  . Hypertension Sister   . Hypertension Brother   . Rheum arthritis Daughter     Also PGM, PGGM   History  Substance Use Topics  . Smoking status: Former Smoker -- 1.00 packs/day    Types: Cigarettes    Quit date: 03/16/1969  . Smokeless tobacco: Never Used  . Alcohol Use: No   OB History   Grav Para Term Preterm Abortions TAB SAB Ect Mult Living                 Review of Systems  Constitutional: Positive for chills and fatigue. Negative for fever and appetite change.  Eyes: Negative for photophobia and visual disturbance.  Respiratory: Positive for cough and shortness of breath.   Cardiovascular: Positive for chest pain and leg swelling.  Gastrointestinal: Negative for nausea, vomiting, abdominal pain, diarrhea and constipation.  Genitourinary:  Negative for dysuria, frequency and decreased urine volume.  Musculoskeletal: Negative for arthralgias, back pain, gait problem and myalgias.  Skin: Negative for color change and wound.  Neurological: Negative for dizziness, syncope, light-headedness and headaches.  Psychiatric/Behavioral: Negative for confusion and agitation.  All other systems reviewed and are negative.    Allergies  Aspirin and Penicillins  Home Medications   Current Outpatient Rx  Name  Route  Sig  Dispense  Refill  . digoxin (LANOXIN) 0.125 MG tablet   Oral   Take 1 tablet (125 mcg total) by mouth daily.   90 tablet   1   . enalapril (VASOTEC) 5 MG tablet   Oral   Take 1 tablet (5 mg total) by mouth daily.   60 tablet   3   . fluticasone (FLONASE) 50 MCG/ACT nasal spray   Nasal   Place 2 sprays into the nose daily as needed. For congestion.   16 g   1   . furosemide (LASIX) 80 MG tablet   Oral   Take 1 tablet (80 mg total) by mouth daily.   90 tablet   3   . halobetasol (ULTRAVATE) 0.05 % cream   Topical   Apply topically 2 (two) times daily.         Marland Kitchen ibuprofen (ADVIL,MOTRIN) 200 MG tablet   Oral   Take 200 mg by mouth every 6 (six) hours as needed for moderate pain.         Marland Kitchen loratadine (CLARITIN) 10 MG tablet   Oral   Take 1 tablet (10 mg total) by mouth daily.   30 tablet   11   . metoprolol succinate (TOPROL-XL) 100 MG 24 hr tablet   Oral   Take 1 tablet (100 mg total) by mouth daily. Take with or immediately following a meal.   90 tablet   3   . omeprazole (PRILOSEC) 20 MG capsule   Oral   Take 1 capsule (20 mg total) by mouth daily.   30 capsule   5   . PHENobarbital (LUMINAL) 32.4 MG tablet      Take 3 tablets by mouth at bedtime.   90 tablet   3   . spironolactone (ALDACTONE) 25 MG tablet   Oral   Take 1 tablet (25 mg total) by mouth daily.   90 tablet   3   . warfarin (COUMADIN) 5 MG tablet   Oral   Take 5 mg by mouth daily. Pt takes 15  mg on Monday,  Wednesday and Friday and 10 mg on Tuesday, Wednesday, Thursday, Saturday and Sunday.          BP 138/74  Pulse 80  Temp(Src) 98.2 F (36.8 C) (Oral)  Ht 5\' 2"  (1.575 m)  Wt 219 lb (99.338 kg)  BMI 40.05 kg/m2  SpO2 98% Physical Exam  Nursing note and vitals reviewed. Constitutional: She is oriented to person, place, and time. No distress.  Elderly female, laying in bed, appears comfortable.   HENT:  Head: Normocephalic and atraumatic.  Mouth/Throat: Mucous membranes are dry.  Eyes: Conjunctivae and EOM are normal. Pupils are equal, round, and reactive to light.  Neck: Normal range of motion. Neck supple.  Cardiovascular: Normal rate, regular rhythm and intact distal pulses.  Exam reveals distant heart sounds.   Pulmonary/Chest: Effort normal. No respiratory distress. She has rales (at bilateral bases).  Abdominal: Soft. Bowel sounds are normal. There is no tenderness. There is no rebound and no guarding.  Musculoskeletal: Normal range of motion. She exhibits edema (1+ LE edema). She exhibits no tenderness.  Neurological: She is alert and oriented to person, place, and time. No cranial nerve deficit. Coordination normal.  Skin: Skin is warm and dry. No rash noted.  Psychiatric: She has a normal mood and affect. Her behavior is normal.    ED Course  Procedures (including critical care time) Labs Review Labs Reviewed  CBC - Abnormal; Notable for the following:    Platelets 131 (*)    All other components within normal limits  COMPREHENSIVE METABOLIC PANEL - Abnormal; Notable for the following:    Glucose, Bld 102 (*)    AST 59 (*)    ALT 41 (*)    GFR calc non Af Amer 87 (*)    All other components within normal limits  PROTIME-INR - Abnormal; Notable for the following:    Prothrombin Time 34.2 (*)    INR 3.55 (*)    All other components within normal limits  DIGOXIN LEVEL - Abnormal; Notable for the following:    Digoxin Level 0.6 (*)    All other components within  normal limits  PRO B NATRIURETIC PEPTIDE - Abnormal; Notable for the following:    Pro B Natriuretic peptide (BNP) 4390.0 (*)    All other components within normal limits  INFLUENZA PANEL BY PCR (TYPE A & B, H1N1)  POCT I-STAT TROPONIN I   Imaging Review Dg Chest 2 View  01/09/2014   CLINICAL DATA:  Cough.  Chest pain.  Cardiomyopathy.  EXAM: CHEST  2 VIEW  COMPARISON:  02/11/2013  FINDINGS: Moderate cardiomegaly is stable. Dual lead transvenous pacemaker remains in place. Both lungs remain clear. No evidence of pulmonary infiltrate or edema. No pleural effusion identified.  IMPRESSION: Stable cardiomegaly.  No active lung disease.   Electronically Signed   By: Myles Rosenthal M.D.   On: 01/09/2014 15:48    EKG Interpretation    Date/Time:  01/09/2014 1515  Ventricular Rate:   74 PR Interval:    QRS Duration:  78 QT Interval:   368 QTC Calculation:  408 R Axis:    17 Text Interpretation:  Paced rhythm, diffuse T wave inversions, no diagnostic STE but limited due to underlying paced rhythm            MDM   63 yo F with history of CHF, last EF 35%, OSA, HTN, atrial fibrillation on coumadin, sick sinus sinus syndrome s/p pacemaker placement, who presents with chest pain,  shortness of breath, cough and chills. Afebrile, vital sign signs stable. Concern for ACS vs CHF exacerbation given history. Low suspicion for PE given character of pain, she is not tachycardic or hypoxic. ECG without evidence of acute ischemia, she is allergic to ASA.  Initial troponin negative which is reassuring. WBC 6.4, Hgb 14.5, normal lytes, mild transaminitis otherwise unremarkable CMP. INR 3.5. BNP is elevated, CXR without edema.  However clinically she is volume overloaded, low suspicion for ongoing sepsis, so she was given dose of IV lasix. Given her risk factors and chest pressure earlier, felt she needed formal cardiac evaluation. She was admitted to [facility] for further management. She remained HD stable  in the ED. I spoke to the patient and her family about her w/u and need for admission, they were in agreement with plan.   Reviewed imaging, ECG, labs and previous medical records, utilized in MDM  Discussed case with Dr. Micheline Maze  Clinical Impression 1. Chest pain 2. Acute on chronic CHF exacerbation     Margie Billet, MD 01/11/14 0045

## 2014-01-09 NOTE — H&P (Signed)
Family Medicine Teaching [facility] Admission History and Physical Service Pager: [phone]  [name] Medical record number: [id number] Date of birth: Aug 10, 1951 Age: 63 y.o. Gender: female  Primary Care Provider: Uvaldo Rising, MD Consultants: None now, cards in am Code Status: Full  Chief Complaint: Chest pain  Assessment and Plan: Misty Gray is a 63 y.o. female presenting with Chest pain and chronic stable dsypnea here with evidence of volume overload  . PMH is significant for NICM, SSS and A fib s/p pacemaker implantation,, systolic CHF, OSA, hypertension, seizure disorder, and history of stroke.  Chest pain - Admit to chest pain rule out unit, cardiology consultation in the morning-appreciate recommendations - Typical exertional chest pain with atypical features  - Known history of nonischemic cardiomyopathy and systolic heart failure - EKG from today with several paced beats limiting evaluation in aVR through aVF, new inverted T wave in V3, stable inverted T waves in V4 through V6 - Cycle troponins - No aspirin with documented allergy - Plan ACS protocol except for aspirin if she rules in  Chronic systolic heart failure- some evidence of volume overload - Last EF was 25-30% in June of 2013, she had akinesis of the entire inferior wall that time - BNP is elevated tonight, however she denies worsened dyspnea, chest x-ray without significant pulmonary vascular congestion - Status post 40 mg IV Lasix in the ED - Saline lock IV - Repeat echo tomorrow - Continue metoprolol, digoxin, and ACE inhibitor  SSS and Afib s/p pacemaker implant - Rate controlled on current regimen - INR supratherapeutic at 3.5, Coumadin per pharmacy - Continue metoprolol and digoxin  Hypertension - Well-controlled today - Continue ACE inhibitor, beta blocker, and spironolactone  Chronic medical conditions: Seizure disorder- Continue phenobarbital GERD- continue  PPI  FEN/GI: SLIV, cardiac diet, PO PPI Prophylaxis: Heparin  Disposition: Admit to chest pain rule out unit for observation, cycling troponins, and IV diuresis.   History of Present Illness: Misty Gray is a 63 y.o. female presenting with chest pain, cough, and chills since about 2 PM one day ago. She states that she came in because she is concerned about an allergic reaction to aspirin. She took it over the counter medication containing aspirin mistakenly and states that she's had hives with aspirin in the past. She developed cough last night productive of clear sputum. At church this morning while walking she had severe substernal chest pain compared to an elephant sitting on her chest that lasted 5-10 minutes without radiation that stopped with rest. She denies nausea, vomiting, or diaphoresis at that time. She also denies current dyspnea, stable 2 pillow orthopnea, and overt fever. She states that she's had chills and cold feeling throughout since that time. She also denies worsening of her lower extremity edema. She states that she only take 40 mg of her Lasix this morning because she didn't want to need to use the bathroom so frequently during church. She denies current chest pain.  Review Of Systems: Per HPI with the following additions: Otherwise 12 point review of systems was performed and was unremarkable.  Patient Active Problem List   Diagnosis Date Noted  . Stasis dermatitis of both legs 01/04/2014  . Hypertension 01/04/2014  . Symptomatic bradycardia 05/05/2013  . Chronic systolic dysfunction of left ventricle 05/05/2013  . Pain in joint of right shoulder region 04/06/2013  . At high risk for falls 02/11/2013  . Vitamin D deficiency 07/15/2012  . Atrial fibrillation 06/14/2012  .  Sinoatrial node dysfunction 05/08/2011  . CARDIOMYOPATHY, PRIMARY, DILATED 08/10/2009  . PACEMAKER, PERMANENT 08/10/2009  . ALLERGIC RHINITIS 05/31/2008  . LICHEN SIMPLEX CHRONICUS 05/05/2007  .  OBESITY, MORBID 02/12/2007  . MIGRAINE, UNSPEC., W/O INTRACTABLE MIGRAINE 02/12/2007  . CHF - EJECTION FRACTION < 50% 02/12/2007  . VENOUS INSUFFICIENCY, CHRONIC 02/12/2007  . GASTROESOPHAGEAL REFLUX, NO ESOPHAGITIS 02/12/2007  . INCONTINENCE, STRESS, FEMALE 02/12/2007  . CONVULSIONS, SEIZURES, NOS 02/12/2007  . APNEA, SLEEP 02/12/2007   Past Medical History: Past Medical History  Diagnosis Date  . Abnormal CT of the head 12/16/1984    R parietal atrophy  . Nonischemic cardiomyopathy     EF has normalized-repeat Pending   . Hypertension   . GERD (gastroesophageal reflux disease)   . Unspecified venous (peripheral) insufficiency   . Diaphragmatic hernia without mention of obstruction or gangrene   . Female stress incontinence   . Allergic rhinitis, cause unspecified   . Morbid obesity   . Lichenification and lichen simplex chronicus   . Pacemaker  st judes   . Heart murmur   . CHF (congestive heart failure)   . H/O hiatal hernia     two removed  . Anemia   . High cholesterol   . Pneumonia 2004; 2011; 11/2011  . Sleep apnea, obstructive     CPAP  . Exertional dyspnea 06/22/12  . Migraine, unspecified, without mention of intractable migraine without mention of status migrainosus     "when I was a teenager"  . Seizures     "can hear you talking but sounds like you are in big tunnel; have them often if not taking RX; last one was 06/17/12" (06/22/12)  . Stroke 1988    "mouth drawed real bad on my left side"  . Sick sinus syndrome     WITH PRIOR DDD PACEMAKER IMPLANTATION  . Atrial fibrillation   . Syncope and collapse 06/22/12    "hit forehead and left knee"; denies loss of consciousness  . Arthritis     "hands and knees"   Past Surgical History: Past Surgical History  Procedure Laterality Date  . Leg skin lesion  biopsy / excision  05/16/2001    Lichen Planus  . Hernia repair  ? 1988; 04/15/1998    ventral  . Insert / replace / remove pacemaker  12/16/1990    DDD EF 30%;  Marland Kitchen  Insert / replace / remove pacemaker  07/25/2003    replaced by BB, leads are both IS1 and from 1992  . Insert / replace / remove pacemaker  05/21/2011    JA; generator change   Social History: History  Substance Use Topics  . Smoking status: Former Smoker -- 1.00 packs/day    Types: Cigarettes    Quit date: 03/16/1969  . Smokeless tobacco: Never Used  . Alcohol Use: No   Additional social history: Please also refer to relevant sections of EMR.  Family History: Family History  Problem Relation Age of Onset  . Stroke Father     died from CAD?  Marland Kitchen Cancer Mother     melanoma  . Diabetes Son     Type 1  . Sleep apnea Son   . Cancer Sister     cervical  . Hypertension Sister   . Hypertension Brother   . Rheum arthritis Daughter     Also PGM, PGGM   Allergies and Medications: Allergies  Allergen Reactions  . Aspirin Hives  . Penicillins Rash and Other (See Comments)    "doesn't  do the job it's suppose to do; I get real hyperactive and I break out in a rash"   No current facility-administered medications on file prior to encounter.   Current Outpatient Prescriptions on File Prior to Encounter  Medication Sig Dispense Refill  . digoxin (LANOXIN) 0.125 MG tablet Take 1 tablet (125 mcg total) by mouth daily.  90 tablet  1  . enalapril (VASOTEC) 5 MG tablet Take 1 tablet (5 mg total) by mouth daily.  60 tablet  3  . fluticasone (FLONASE) 50 MCG/ACT nasal spray Place 2 sprays into the nose daily as needed. For congestion.  16 g  1  . furosemide (LASIX) 80 MG tablet Take 1 tablet (80 mg total) by mouth daily.  90 tablet  3  . halobetasol (ULTRAVATE) 0.05 % cream Apply topically 2 (two) times daily.      Marland Kitchen. loratadine (CLARITIN) 10 MG tablet Take 1 tablet (10 mg total) by mouth daily.  30 tablet  11  . metoprolol succinate (TOPROL-XL) 100 MG 24 hr tablet Take 1 tablet (100 mg total) by mouth daily. Take with or immediately following a meal.  90 tablet  3  . omeprazole (PRILOSEC) 20 MG  capsule Take 1 capsule (20 mg total) by mouth daily.  30 capsule  5  . PHENobarbital (LUMINAL) 32.4 MG tablet Take 3 tablets by mouth at bedtime.  90 tablet  3  . spironolactone (ALDACTONE) 25 MG tablet Take 1 tablet (25 mg total) by mouth daily.  90 tablet  3    Objective: BP 126/78  Pulse 80  Temp(Src) 100.1 F (37.8 C) (Oral)  Resp 20  Ht 5\' 2"  (1.575 m)  Wt 219 lb (99.338 kg)  BMI 40.05 kg/m2  SpO2 95% Exam: Gen: NAD, alert, cooperative with exam, ill appearing female HEENT: NCAT, PERRL, MMM CV: Irregularly irregular rhythm, good S1/S2, no murmur Resp: Crackles most prominent in left lower lung but also present in right lower lung, nonlabored, speaking in complete sentences Abd: SNTND, obese, BS present, no guarding or organomegaly, well-healed scar below umbilicus Ext: 1+ pitting edema bilaterally, 2+ DP pulses Neuro: Alert and oriented, No gross deficits, normal speech   Labs and Imaging: CBC BMET   Recent Labs Lab 01/09/14 1603  WBC 6.4  HGB 14.5  HCT 43.5  PLT 131*    Recent Labs Lab 01/09/14 1603  NA 137  K 4.1  CL 99  CO2 24  BUN 23  CREATININE 0.77  GLUCOSE 102*  CALCIUM 9.1     Pro BNP 4390.0 I-STAT troponin 0.01  INR 3.55 Digoxin level 0.6  DG chest 1/25 IMPRESSION:  Stable cardiomegaly. No active lung disease. - Small left pleural effusion on my read  Elenora GammaSamuel L Bradshaw, MD 01/09/2014, 7:48 PM PGY-2, [facility] [facility] FPTS Intern pager: [phone], text pages welcome

## 2014-01-09 NOTE — ED Notes (Signed)
Pt here with c/o productive cough and intermittent chest pain , pt also thinks that she may had an allergic reaction to some asa

## 2014-01-09 NOTE — Progress Notes (Signed)
ANTICOAGULATION CONSULT NOTE - Initial Consult  Pharmacy Consult for Coumadin Indication: atrial fibrillation  Allergies  Allergen Reactions  . Aspirin Hives  . Penicillins Rash and Other (See Comments)    "doesn't do the job it's suppose to do; I get real hyperactive and I break out in a rash"    Patient Measurements: Height: 5\' 2"  (157.5 cm) Weight: 219 lb (99.338 kg) IBW/kg (Calculated) : 50.1  Labs:  Recent Labs  01/09/14 1603  HGB 14.5  HCT 43.5  PLT 131*  LABPROT 34.2*  INR 3.55*  CREATININE 0.77    Estimated Creatinine Clearance: 78.3 ml/min (by C-G formula based on Cr of 0.77).   Medical History: Past Medical History  Diagnosis Date  . Abnormal CT of the head 12/16/1984    R parietal atrophy  . Nonischemic cardiomyopathy     EF has normalized-repeat Pending   . Hypertension   . GERD (gastroesophageal reflux disease)   . Unspecified venous (peripheral) insufficiency   . Diaphragmatic hernia without mention of obstruction or gangrene   . Female stress incontinence   . Allergic rhinitis, cause unspecified   . Morbid obesity   . Lichenification and lichen simplex chronicus   . Pacemaker  st judes   . Heart murmur   . CHF (congestive heart failure)   . H/O hiatal hernia     two removed  . Anemia   . High cholesterol   . Pneumonia 2004; 2011; 11/2011  . Sleep apnea, obstructive     CPAP  . Exertional dyspnea 06/22/12  . Migraine, unspecified, without mention of intractable migraine without mention of status migrainosus     "when I was a teenager"  . Seizures     "can hear you talking but sounds like you are in big tunnel; have them often if not taking RX; last one was 06/17/12" (06/22/12)  . Stroke 1988    "mouth drawed real bad on my left side"  . Sick sinus syndrome     WITH PRIOR DDD PACEMAKER IMPLANTATION  . Atrial fibrillation   . Syncope and collapse 06/22/12    "hit forehead and left knee"; denies loss of consciousness  . Arthritis     "hands and  knees"    Assessment: 63 year old female on Coumadin PTA for Afib.   Admit INR = 3.55 Home dose = 15 mg MWF, 10 mg TTSS Last dose today  Goal of Therapy:  INR 2-3 Monitor platelets by anticoagulation protocol: Yes   Plan:  1) No Coumadin today 2) Daily INR  Thank you. Okey Regal, PharmD [phone]  Elwin Sleight 01/09/2014,8:32 PM

## 2014-01-10 ENCOUNTER — Other Ambulatory Visit: Payer: Self-pay

## 2014-01-10 DIAGNOSIS — I5023 Acute on chronic systolic (congestive) heart failure: Secondary | ICD-10-CM

## 2014-01-10 DIAGNOSIS — Z950 Presence of cardiac pacemaker: Secondary | ICD-10-CM

## 2014-01-10 DIAGNOSIS — R509 Fever, unspecified: Secondary | ICD-10-CM

## 2014-01-10 LAB — HEMOGLOBIN A1C
Hgb A1c MFr Bld: 6 % — ABNORMAL HIGH (ref <5.7)
MEAN PLASMA GLUCOSE: 126 mg/dL — AB (ref <117)

## 2014-01-10 LAB — BASIC METABOLIC PANEL
BUN: 21 mg/dL (ref 6–23)
CHLORIDE: 99 meq/L (ref 96–112)
CO2: 23 mEq/L (ref 19–32)
Calcium: 8.8 mg/dL (ref 8.4–10.5)
Creatinine, Ser: 0.65 mg/dL (ref 0.50–1.10)
Glucose, Bld: 91 mg/dL (ref 70–99)
Potassium: 3.8 mEq/L (ref 3.7–5.3)
Sodium: 137 mEq/L (ref 137–147)

## 2014-01-10 LAB — URINALYSIS, ROUTINE W REFLEX MICROSCOPIC
Bilirubin Urine: NEGATIVE
Glucose, UA: NEGATIVE mg/dL
Hgb urine dipstick: NEGATIVE
KETONES UR: NEGATIVE mg/dL
LEUKOCYTES UA: NEGATIVE
Nitrite: NEGATIVE
PH: 6.5 (ref 5.0–8.0)
Protein, ur: NEGATIVE mg/dL
Specific Gravity, Urine: 1.018 (ref 1.005–1.030)
Urobilinogen, UA: 2 mg/dL — ABNORMAL HIGH (ref 0.0–1.0)

## 2014-01-10 LAB — TROPONIN I
Troponin I: <0.3 ng/mL (ref <0.30)
Troponin I: <0.3 ng/mL (ref <0.30)

## 2014-01-10 LAB — CBC
HEMATOCRIT: 38.9 % (ref 36.0–46.0)
HEMOGLOBIN: 12.9 g/dL (ref 12.0–15.0)
MCH: 31.2 pg (ref 26.0–34.0)
MCHC: 33.2 g/dL (ref 30.0–36.0)
MCV: 94 fL (ref 78.0–100.0)
Platelets: 93 10*3/uL — ABNORMAL LOW (ref 150–400)
RBC: 4.14 MIL/uL (ref 3.87–5.11)
RDW: 15.3 % (ref 11.5–15.5)
WBC: 5.2 10*3/uL (ref 4.0–10.5)

## 2014-01-10 LAB — LIPID PANEL
Cholesterol: 126 mg/dL (ref 0–200)
HDL: 56 mg/dL (ref >39)
LDL Cholesterol: 57 mg/dL (ref 0–99)
TRIGLYCERIDES: 64 mg/dL (ref <150)
Total CHOL/HDL Ratio: 2.3 RATIO
VLDL: 13 mg/dL (ref 0–40)

## 2014-01-10 LAB — PROTIME-INR
INR: 2.67 — ABNORMAL HIGH (ref 0.00–1.49)
Prothrombin Time: 27.5 seconds — ABNORMAL HIGH (ref 11.6–15.2)

## 2014-01-10 LAB — INFLUENZA PANEL BY PCR (TYPE A & B)
H1N1FLUPCR: NOT DETECTED
INFLBPCR: NEGATIVE
Influenza A By PCR: POSITIVE — AB

## 2014-01-10 LAB — TSH: TSH: 0.331 u[IU]/mL — ABNORMAL LOW (ref 0.350–4.500)

## 2014-01-10 MED ORDER — FUROSEMIDE 10 MG/ML IJ SOLN
40.0000 mg | Freq: Once | INTRAMUSCULAR | Status: AC
  Administered 2014-01-10: 40 mg via INTRAVENOUS
  Filled 2014-01-10: qty 4

## 2014-01-10 MED ORDER — GUAIFENESIN 100 MG/5ML PO SOLN
5.0000 mL | ORAL | Status: DC | PRN
  Administered 2014-01-10: 100 mg via ORAL
  Filled 2014-01-10: qty 5

## 2014-01-10 MED ORDER — WARFARIN SODIUM 10 MG PO TABS
10.0000 mg | ORAL_TABLET | Freq: Once | ORAL | Status: AC
  Administered 2014-01-10: 10 mg via ORAL
  Filled 2014-01-10: qty 1

## 2014-01-10 MED ORDER — OSELTAMIVIR PHOSPHATE 75 MG PO CAPS
75.0000 mg | ORAL_CAPSULE | Freq: Two times a day (BID) | ORAL | Status: DC
  Administered 2014-01-10 – 2014-01-12 (×5): 75 mg via ORAL
  Filled 2014-01-10 (×6): qty 1

## 2014-01-10 MED ORDER — METOPROLOL TARTRATE 25 MG PO TABS
25.0000 mg | ORAL_TABLET | Freq: Two times a day (BID) | ORAL | Status: DC
  Administered 2014-01-11 (×2): 25 mg via ORAL
  Filled 2014-01-10 (×6): qty 1

## 2014-01-10 NOTE — Evaluation (Signed)
Physical Therapy Evaluation Patient Details Name: Misty Gray MRN: [id number] DOB: 1951/05/07 Today's Date: 01/10/2014 Time: 4239-5320 PT Time Calculation (min): 11 min  PT Assessment / Plan / Recommendation History of Present Illness  Pt adm with chest pain and found to be +flu.  Pt also with hx of heart failure.  Clinical Impression  Pt admitted with above. Pt currently with functional limitations due to the deficits listed below (see PT Problem List).  Pt will benefit from skilled PT to increase their independence and safety with mobility to allow discharge home with family.     PT Assessment  Patient needs continued PT services    Follow Up Recommendations  No PT follow up    Does the patient have the potential to tolerate intense rehabilitation      Barriers to Discharge        Equipment Recommendations  None recommended by PT    Recommendations for Other Services     Frequency Min 3X/week    Precautions / Restrictions Precautions Precautions: Fall   Pertinent Vitals/Pain Headache - nurse aware      Mobility  Bed Mobility Overal bed mobility: Needs Assistance Bed Mobility: Supine to Sit Supine to sit: Min assist;HOB elevated General bed mobility comments: Assist to bring trunk up Transfers Overall transfer level: Needs assistance Equipment used: None Transfers: Sit to/from Stand Sit to Stand: Min guard Ambulation/Gait Ambulation/Gait assistance: Min guard;Min assist Ambulation Distance (Feet): 40 Feet Assistive device: None Gait Pattern/deviations: Step-through pattern;Decreased step length - right;Decreased step length - left;Wide base of support Gait velocity: slow Gait velocity interpretation: Below normal speed for age/gender General Gait Details: Slightly unsteady with loss of balance x 1 with 180 degree turn    Exercises     PT Diagnosis: Difficulty walking  PT Problem List: Decreased activity tolerance;Decreased balance;Decreased mobility PT  Treatment Interventions: DME instruction;Gait training;Functional mobility training;Therapeutic activities;Therapeutic exercise;Balance training;Patient/family education     PT Goals(Current goals can be found in the care plan section) Acute Rehab PT Goals Patient Stated Goal: Return home PT Goal Formulation: With patient Time For Goal Achievement: 01/17/14 Potential to Achieve Goals: Good  Visit Information  Last PT Received On: 01/10/14 Assistance Needed: +1 History of Present Illness: Pt adm with chest pain and found to be +flu.  Pt also with hx of heart failure.       Prior Functioning  Home Living Family/patient expects to be discharged to:: Private residence Living Arrangements: Spouse/significant other Available Help at Discharge: Family;Available 24 hours/day Type of Home: House Home Access: Stairs to enter [employer] of Steps: 3 Entrance Stairs-Rails: Right Home Layout: One level Home Equipment: Bedside commode;Tub bench Prior Function Level of Independence: Independent Communication Communication: No difficulties Dominant Hand: Right    Cognition  Cognition Arousal/Alertness: Awake/alert Behavior During Therapy: WFL for tasks assessed/performed Overall Cognitive Status: Within Functional Limits for tasks assessed    Extremity/Trunk Assessment Upper Extremity Assessment Upper Extremity Assessment: Overall WFL for tasks assessed Lower Extremity Assessment Lower Extremity Assessment: Overall WFL for tasks assessed   Balance Balance Overall balance assessment: Needs assistance Standing balance support: No upper extremity supported Standing balance-Leahy Scale: Fair  End of Session PT - End of Session Activity Tolerance: Patient tolerated treatment well Patient left: in bed;with call bell/phone within reach;with family/visitor present Nurse Communication: Mobility status  GP     [facility] 01/10/2014, 3:33 PM  [facility] PT [phone]

## 2014-01-10 NOTE — H&P (Signed)
Seen and examined.  Discussed with Dr. Ermalinda Memos.  Agree with the admit plus his management and documentation.  Briefly, 63 yo female with known CHF presents with fever and chest pain.  The sequence, as best I can tell, is that she felt bad yesterday morning (likely feverish but did not take temp).  On the way home from church developed chest pain - typical for cardiac origin - like an elephant sitting on my chest.  Came to ER.  Denies further chest pain.  Has had documented significant fevers without any obvious source of infection (denies cough, myalgias, urgency frequency and no skin breakdown.)  As an aside, she has a hx of CHF and is a bit volume overloaded right now - but I doubt that is playing much role for her acute illness.  Defer to cards.  Likely needs a stress test but would wait until she is afebrile.

## 2014-01-10 NOTE — Progress Notes (Signed)
Case discussed with resident team today.   Paula Compton, MD

## 2014-01-10 NOTE — Progress Notes (Signed)
ANTICOAGULATION CONSULT NOTE - Follow Up Consult  Pharmacy Consult for coumadin Indication: atrial fibrillation  Allergies  Allergen Reactions  . Aspirin Hives  . Penicillins Rash and Other (See Comments)    "doesn't do the job it's suppose to do; I get real hyperactive and I break out in a rash"    Patient Measurements: Height: 5\' 2"  (157.5 cm) Weight: 219 lb 3.2 oz (99.428 kg) IBW/kg (Calculated) : 50.1   Vital Signs: Temp: 98.1 F (36.7 C) (01/26 1157) Temp src: Oral (01/26 1157) BP: 124/84 mmHg (01/26 1157) Pulse Rate: 74 (01/26 1157)  Labs:  Recent Labs  01/09/14 1603 01/09/14 2350 01/10/14 0430 01/10/14 0850  HGB 14.5  --  12.9  --   HCT 43.5  --  38.9  --   PLT 131*  --  93*  --   LABPROT 34.2*  --  27.5*  --   INR 3.55*  --  2.67*  --   CREATININE 0.77  --  0.65  --   TROPONINI  --  <0.30 <0.30 <0.30    Estimated Creatinine Clearance: 78.3 ml/min (by C-G formula based on Cr of 0.65).   Assessment: Patient is a 63 y.o F on coumadin PTA for Afib.  Home regimen is 10mg  daily except 15mg  on MWF.  INR was supratherapeutic on admit at 3.55 but now down to within therapeutic range (2.67) after dose held last night.  Goal of Therapy:  INR 2-3    Plan:  1) coumadin 10mg  PO x1 today  Misty Gray P 01/10/2014,2:00 PM

## 2014-01-10 NOTE — Consult Note (Signed)
Primary Physician: Primary Cardiologist: Allred   HPI: Patient is a 63 yo who we are asked to see re CP The patinet has a history of NICM (LVEF 25 to 30% in May 2013), SSS (s/p DDD pacer), atiral fibriallation, syncope, CVA, seizure disorder.   Yesterday she developed CP while at church  Was a pressure senstation.  Patient tried to walk across the street to her house  Usually takes 5 min  Took her 15.  Episode eased off.  Then began to feel chilled.   Took a cold relief medicine that had asprin in it by accident  Son brought her to ER because she is allergic to ASA. Prior to SUnday she had been feeling OK  No CP  No edema.  No SOB Takes lasix prn for swelling.          Past Medical History  Diagnosis Date  . Abnormal CT of the head 12/16/1984    R parietal atrophy  . Nonischemic cardiomyopathy     EF has normalized-repeat Pending   . Hypertension   . GERD (gastroesophageal reflux disease)   . Unspecified venous (peripheral) insufficiency   . Diaphragmatic hernia without mention of obstruction or gangrene   . Female stress incontinence   . Allergic rhinitis, cause unspecified   . Morbid obesity   . Lichenification and lichen simplex chronicus   . Pacemaker  st judes   . Heart murmur   . CHF (congestive heart failure)   . H/O hiatal hernia     two removed  . Anemia   . High cholesterol   . Pneumonia 2004; 2011; 11/2011  . Sleep apnea, obstructive     CPAP  . Exertional dyspnea 06/22/12  . Migraine, unspecified, without mention of intractable migraine without mention of status migrainosus     "when I was a teenager"  . Seizures     "can hear you talking but sounds like you are in big tunnel; have them often if not taking RX; last one was 06/17/12" (06/22/12)  . Stroke 1988    "mouth drawed real bad on my left side"  . Sick sinus syndrome     WITH PRIOR DDD PACEMAKER IMPLANTATION  . Atrial fibrillation   . Syncope and collapse 06/22/12    "hit forehead and left knee";  denies loss of consciousness  . Arthritis     "hands and knees"    Medications Prior to Admission  Medication Sig Dispense Refill  . digoxin (LANOXIN) 0.125 MG tablet Take 1 tablet (125 mcg total) by mouth daily.  90 tablet  1  . enalapril (VASOTEC) 5 MG tablet Take 1 tablet (5 mg total) by mouth daily.  60 tablet  3  . fluticasone (FLONASE) 50 MCG/ACT nasal spray Place 2 sprays into the nose daily as needed. For congestion.  16 g  1  . furosemide (LASIX) 80 MG tablet Take 1 tablet (80 mg total) by mouth daily.  90 tablet  3  . halobetasol (ULTRAVATE) 0.05 % cream Apply topically 2 (two) times daily.      Marland Kitchen ibuprofen (ADVIL,MOTRIN) 200 MG tablet Take 200 mg by mouth every 6 (six) hours as needed for moderate pain.      Marland Kitchen loratadine (CLARITIN) 10 MG tablet Take 1 tablet (10 mg total) by mouth daily.  30 tablet  11  . metoprolol succinate (TOPROL-XL) 100 MG 24 hr tablet Take 1 tablet (100 mg total) by mouth daily. Take with or immediately following a  meal.  90 tablet  3  . omeprazole (PRILOSEC) 20 MG capsule Take 1 capsule (20 mg total) by mouth daily.  30 capsule  5  . PHENobarbital (LUMINAL) 32.4 MG tablet Take 3 tablets by mouth at bedtime.  90 tablet  3  . spironolactone (ALDACTONE) 25 MG tablet Take 1 tablet (25 mg total) by mouth daily.  90 tablet  3  . warfarin (COUMADIN) 5 MG tablet Take 5 mg by mouth daily. Pt takes 15 mg on Monday, Wednesday and Friday and 10 mg on Tuesday, Wednesday, Thursday, Saturday and Sunday.         . digoxin  125 mcg Oral Daily  . enalapril  5 mg Oral Daily  . metoprolol succinate  100 mg Oral Daily  . pantoprazole  40 mg Oral Daily  . PHENobarbital  97.2 mg Oral QHS  . spironolactone  25 mg Oral Daily  . Warfarin - Pharmacist Dosing Inpatient   Does not apply q1800    Infusions:    Allergies  Allergen Reactions  . Aspirin Hives  . Penicillins Rash and Other (See Comments)    "doesn't do the job it's suppose to do; I get real hyperactive and I  break out in a rash"    History   Social History  . Marital Status: Married    Spouse Name: [name]    Number of Children: 4  . Years of Education: 12   Occupational History  . disabled    Social History Main Topics  . Smoking status: Former Smoker -- 1.00 packs/day    Types: Cigarettes    Quit date: 03/16/1969  . Smokeless tobacco: Never Used  . Alcohol Use: No  . Drug Use: No  . Sexual Activity: Not Currently    Birth Control/ Protection: Post-menopausal   Other Topics Concern  . Not on file   Social History Narrative   Lives with husband   Son - [name] lives with them as do his 2 children   Daughter, [name], and her 3 children live in [place]   Son -  [name] is incarcerated   Nephew, Donielle Kaigler lives with them   Daughter, [name], in [place]   Moved June 2013 to a better home on [address]    Family History  Problem Relation Age of Onset  . Stroke Father     died from CAD?  Marland Kitchen Cancer Mother     melanoma  . Diabetes Son     Type 1  . Sleep apnea Son   . Cancer Sister     cervical  . Hypertension Sister   . Hypertension Brother   . Rheum arthritis Daughter     Also PGM, PGGM    REVIEW OF SYSTEMS:  All systems reviewed  Negative to the above problem except as noted above.    PHYSICAL EXAM: Filed Vitals:   01/10/14 0742  BP: 97/61  Pulse: 74  Temp: 98.8 F (37.1 C)  Resp: 18     Intake/Output Summary (Last 24 hours) at 01/10/14 0750 Last data filed at 01/10/14 1610  Gross per 24 hour  Intake    240 ml  Output   2475 ml  Net  -2235 ml    General:  Patietn appears flushed.   No respiratory difficulty HEENT: normal Neck: supple. JVP is increased.  . Carotids 2+ bilat; no bruits. No lymphadenopathy or thryomegaly appreciated. Cor: PMI nondisplaced. Regular rate & rhythm. No rubs,  gallops or murmurs. Lungs:  Rales at bases.   Abdomen: soft, nontender, nondistended. No hepatosplenomegaly. No  bruits or masses. Good bowel sounds. Extremities:  2+ PT pulses  Tr edema legs  Chronic induraton of leg.s  Neuro: alert & oriented x 3, cranial nerves grossly intact. moves all 4 extremities w/o difficulty. Affect pleasant.  Tele:  afib ECG:  Afib 70  Ventricular paced beats.    Results for orders placed during the hospital encounter of 01/09/14 (from the past 24 hour(s))  CBC     Status: Abnormal   Collection Time    01/09/14  4:03 PM      Result Value Range   WBC 6.4  4.0 - 10.5 K/uL   RBC 4.60  3.87 - 5.11 MIL/uL   Hemoglobin 14.5  12.0 - 15.0 g/dL   HCT 16.1  09.6 - 04.5 %   MCV 94.6  78.0 - 100.0 fL   MCH 31.5  26.0 - 34.0 pg   MCHC 33.3  30.0 - 36.0 g/dL   RDW 40.9  81.1 - 91.4 %   Platelets 131 (*) 150 - 400 K/uL  COMPREHENSIVE METABOLIC PANEL     Status: Abnormal   Collection Time    01/09/14  4:03 PM      Result Value Range   Sodium 137  137 - 147 mEq/L   Potassium 4.1  3.7 - 5.3 mEq/L   Chloride 99  96 - 112 mEq/L   CO2 24  19 - 32 mEq/L   Glucose, Bld 102 (*) 70 - 99 mg/dL   BUN 23  6 - 23 mg/dL   Creatinine, Ser 7.82  0.50 - 1.10 mg/dL   Calcium 9.1  8.4 - 95.6 mg/dL   Total Protein 7.5  6.0 - 8.3 g/dL   Albumin 3.6  3.5 - 5.2 g/dL   AST 59 (*) 0 - 37 U/L   ALT 41 (*) 0 - 35 U/L   Alkaline Phosphatase 107  39 - 117 U/L   Total Bilirubin 0.7  0.3 - 1.2 mg/dL   GFR calc non Af Amer 87 (*) >90 mL/min   GFR calc Af Amer >90  >90 mL/min  PROTIME-INR     Status: Abnormal   Collection Time    01/09/14  4:03 PM      Result Value Range   Prothrombin Time 34.2 (*) 11.6 - 15.2 seconds   INR 3.55 (*) 0.00 - 1.49  POCT I-STAT TROPONIN I     Status: None   Collection Time    01/09/14  4:10 PM      Result Value Range   Troponin i, poc 0.01  0.00 - 0.08 ng/mL   Comment 3           DIGOXIN LEVEL     Status: Abnormal   Collection Time    01/09/14  5:30 PM      Result Value Range   Digoxin Level 0.6 (*) 0.8 - 2.0 ng/mL  PRO B NATRIURETIC PEPTIDE     Status:  Abnormal   Collection Time    01/09/14  5:30 PM      Result Value Range   Pro B Natriuretic peptide (BNP) 4390.0 (*) 0 - 125 pg/mL  TROPONIN I     Status: None   Collection Time    01/09/14 11:50 PM      Result Value Range   Troponin I <0.30  <0.30 ng/mL  PROTIME-INR     Status: Abnormal  Collection Time    01/10/14  4:30 AM      Result Value Range   Prothrombin Time 27.5 (*) 11.6 - 15.2 seconds   INR 2.67 (*) 0.00 - 1.49  TROPONIN I     Status: None   Collection Time    01/10/14  4:30 AM      Result Value Range   Troponin I <0.30  <0.30 ng/mL  BASIC METABOLIC PANEL     Status: None   Collection Time    01/10/14  4:30 AM      Result Value Range   Sodium 137  137 - 147 mEq/L   Potassium 3.8  3.7 - 5.3 mEq/L   Chloride 99  96 - 112 mEq/L   CO2 23  19 - 32 mEq/L   Glucose, Bld 91  70 - 99 mg/dL   BUN 21  6 - 23 mg/dL   Creatinine, Ser 2.440.65  0.50 - 1.10 mg/dL   Calcium 8.8  8.4 - 01.010.5 mg/dL   GFR calc non Af Amer >90  >90 mL/min   GFR calc Af Amer >90  >90 mL/min  CBC     Status: Abnormal   Collection Time    01/10/14  4:30 AM      Result Value Range   WBC 5.2  4.0 - 10.5 K/uL   RBC 4.14  3.87 - 5.11 MIL/uL   Hemoglobin 12.9  12.0 - 15.0 g/dL   HCT 27.238.9  53.636.0 - 64.446.0 %   MCV 94.0  78.0 - 100.0 fL   MCH 31.2  26.0 - 34.0 pg   MCHC 33.2  30.0 - 36.0 g/dL   RDW 03.415.3  74.211.5 - 59.515.5 %   Platelets 93 (*) 150 - 400 K/uL  LIPID PANEL     Status: None   Collection Time    01/10/14  4:30 AM      Result Value Range   Cholesterol 126  0 - 200 mg/dL   Triglycerides 64  <638<150 mg/dL   HDL 56  >75>39 mg/dL   Total CHOL/HDL Ratio 2.3     VLDL 13  0 - 40 mg/dL   LDL Cholesterol 57  0 - 99 mg/dL  URINALYSIS, ROUTINE W REFLEX MICROSCOPIC     Status: Abnormal   Collection Time    01/10/14  6:24 AM      Result Value Range   Color, Urine YELLOW  YELLOW   APPearance CLEAR  CLEAR   Specific Gravity, Urine 1.018  1.005 - 1.030   pH 6.5  5.0 - 8.0   Glucose, UA NEGATIVE  NEGATIVE mg/dL     Hgb urine dipstick NEGATIVE  NEGATIVE   Bilirubin Urine NEGATIVE  NEGATIVE   Ketones, ur NEGATIVE  NEGATIVE mg/dL   Protein, ur NEGATIVE  NEGATIVE mg/dL   Urobilinogen, UA 2.0 (*) 0.0 - 1.0 mg/dL   Nitrite NEGATIVE  NEGATIVE   Leukocytes, UA NEGATIVE  NEGATIVE   Dg Chest 2 View  01/09/2014   CLINICAL DATA:  Cough.  Chest pain.  Cardiomyopathy.  EXAM: CHEST  2 VIEW  COMPARISON:  02/11/2013  FINDINGS: Moderate cardiomegaly is stable. Dual lead transvenous pacemaker remains in place. Both lungs remain clear. No evidence of pulmonary infiltrate or edema. No pleural effusion identified.  IMPRESSION: Stable cardiomegaly.  No active lung disease.   Electronically Signed   By: Myles RosenthalJohn  Stahl M.D.   On: 01/09/2014 15:48     ASSESSMENT:  1.  Chest pressure  The patient has evidence of some volume overload on exam  This may be exacerbating chest pressure  i am not convinced of active coronary ischmemia  All may be exacerbated by infection as patietn is febrile this AM  I would recomm diuresing with IV lasix.  Follow clinically.    2.  Acute on chronic systolic CHF  Would diurese as noted above.  Follow renal function.  Continue meds   3.  Fever.  Patient has had blood cultures drawn  Flu test pending.  4.  Atrial fib.  COntinue coumadin  5.  Bradycardia  S/p PPM  6.  HTN  Follow BP  May need to hold meds given current illness bp may be running lower.

## 2014-01-10 NOTE — Progress Notes (Signed)
Family Medicine Teaching [facility] Admission History and Physical Service Pager: [phone]  [name] Medical record number: [id number] Date of birth: 1951/02/27 Age: 63 y.o. Gender: female  Primary Care Provider: Uvaldo Rising, MD Consultants: None now, cards in am Code Status: Full  Pt Overview and Major Events to Date:  1/26: febrile, Flu (+), Start Tamiflu, ACS rule-out complete, continue IV Lasix w/ good response, B-blocker adjusted for low BPs, ECHO pending  Assessment and Plan: Misty Gray is a 63 y.o. female presenting with Chest pain and chronic stable dsypnea here with evidence of volume overload  . PMH is significant for NICM, SSS and A fib s/p pacemaker implantation, systolic CHF, OSA, hypertension, seizure disorder, and history of stroke.  # Chest pain - Typical exertional chest pain with atypical features now resolved. Known history of nonischemic cardiomyopathy and systolic heart failure. EKG from yesterday with several paced beats limiting evaluation in aVR through aVF, new inverted T wave in V3, stable inverted T waves in V4 through V6. Continues to have bibasilar crackles. - Cardiology recommends continuing IV Lasix, coumadin, HF medications, consider holding BP meds,  - Troponins neg x3 - No aspirin with documented allergy  #Influenza - Patient became febrile overnight to 102.8, Influenza A (+) - Start Tamiflu 75mg  BID  # Chronic systolic heart failure- some evidence of volume overload - Last EF was 25-30% in June of 2013, she had akinesis of the entire inferior wall that time - BNP is elevated, however she denies worsened dyspnea, chest x-ray without significant pulmonary vascular congestion - Status post 80 mg IV Lasix since arrival, UOP 2.1L overnight - Repeat echo today - BPs running low, change from Metoprolol XR 100mg  qd to Metoprolol tartrate 25mg  BID - Continue digoxin and ACE inhibitor  #SSS and Afib s/p pacemaker implant - Rate  controlled on current regimen - INR supratherapeutic at 3.5 and down to 2.67 today after holding a dose.  - Coumadin per pharmacy and Cards recomendations  - Continue metoprolol at adjusted dose and digoxin  #Hypertension - Dipping low at 97/61 from previous 131/83 - Continue ACE inhibitor and spironolactone - B-blocker adjustment per above  Chronic medical conditions: Seizure disorder- Continue phenobarbital GERD- continue PPI  FEN/GI: SLIV, cardiac diet, PO PPI Prophylaxis: Heparin  Disposition: Continue IV diuresis, awaiting ECHO, continue IV medi   Subjective: Patient sitting on the side of the bed breathing comfortably. States she continues to not feel well and describes fevers and cold sweats overnight.   Objective: BP 124/84  Pulse 74  Temp(Src) 98.1 F (36.7 C) (Oral)  Resp 18  Ht 5\' 2"  (1.575 m)  Wt 219 lb 3.2 oz (99.428 kg)  BMI 40.08 kg/m2  SpO2 97% Exam: Gen: NAD, alert, cooperative with exam, ill appearing female HEENT: NCAT, PERRL, MMM CV: Irregularly irregular rhythm, good S1/S2, no murmur Resp: Crackles most prominent in left lower lung but also present in right lower lung, nonlabored, speaking in complete sentences Abd: SNTND, obese, BS present, no guarding or organomegaly, well-healed scar below umbilicus Ext: trace edema bilaterally, 2+ DP pulses Neuro: Alert and oriented, No gross deficits, normal speech   Labs and Imaging: CBC BMET   Recent Labs Lab 01/10/14 0430  WBC 5.2  HGB 12.9  HCT 38.9  PLT 93*    Recent Labs Lab 01/10/14 0430  NA 137  K 3.8  CL 99  CO2 23  BUN 21  CREATININE 0.65  GLUCOSE 91  CALCIUM 8.8  Lipid Panel     Component Value Date/Time   CHOL 126 01/10/2014 0430   TRIG 64 01/10/2014 0430   HDL 56 01/10/2014 0430   CHOLHDL 2.3 01/10/2014 0430   VLDL 13 01/10/2014 0430   LDLCALC 57 01/10/2014 0430   Hgb A1c 6.0 TSH 0.331 Influenza A (+) INR 3.55 --> 2.67 today  Pro BNP 4390.0 Troponin neg x3 Digoxin  level 0.6  Blood Cx: pending  DG chest 1/25 IMPRESSION:  Stable cardiomegaly. No active lung disease. - Small left pleural effusion on my read  Thank you,  Feliberto HartsAaren Hunt, Med Student 01/10/2014, 12:39 PM   R3 Addendum:  I have seen the above patient and have discussed the patient's presentation, history, objective data, physical exam and assessment and plan with MSIV Leeann MustAaren Hung.    Subjective: Patient reporting feeling better overall, no chest pain.  Reports significant 2-3 pillow orthopnea  Obj: Vital signs and labs reviewed.  Notable for afebrile over night and influenza positive. PHYSICAL EXAM: GENERAL:  adult appearance greater than age female. In no discomfort; no respiratory distress  PSYCH: alert and appropriate, good insight   HNEENT:  slight JVD with positive hepatojugular reflex   CARDIO: RRR, S1/S2 heard, no murmur  LUNGS: CTA B, no wheezes, bibasalar crackles   ABDOMEN:  protuberant   EXTREM:  Warm, well perfused.  Moves all 4 extremities spontaneously; no lateralization.  1+/4 pretibial edema.    A&P: Chest pain, chronic systolic congestive heart failure, SSS with A. Fib s/p Pacer, HTN: -  CP resolved, ACS evaluation negative, cardiology does not feel further ischemic workup warranted,  echo pending, rate controlled, coumadin per pharmacy  Influenza: Started on Tamiflu, will need 5 days  Disposition: Pending further improvement and awaiting echo results; likely DC tomorrow 1/27.  Andrena MewsMichael D Eilam [place], DO [facility] Res[facility] - PGY-3 01/10/2014 3:44 PM

## 2014-01-10 NOTE — Progress Notes (Signed)
Pt temp 102.8 orally.  Pt denies any c/o.  MD notified with new orders.  Will continue to monitor.

## 2014-01-10 NOTE — Discharge Instructions (Addendum)
You were admitted to t he hospital to make sure the chest pain your were having was not due to a heart attack, we have determined that it was not.   You have the flu, finish the course of the medicine called tamiflu for this  We have adjusted your metoprolol dose, you can still take 1/2 of your 100 mg tablet but I have sent a new 50 mg tablet so you don't have to cut it.  Be sure to see Dr. Randolm IdolFletke and get your INR checked as scheduled.    Information on my medicine - Coumadin   (Warfarin)  This medication education was reviewed with me or my healthcare representative as part of my discharge preparation.  The pharmacist that spoke with me during my hospital stay was:  Lucia Gaskinsham, Anh P, [facility]  Why was Coumadin prescribed for you? Coumadin was prescribed for you because you have a blood clot or a medical condition that can cause an increased risk of forming blood clots. Blood clots can cause serious health problems by blocking the flow of blood to the heart, lung, or brain. Coumadin can prevent harmful blood clots from forming. As a reminder your indication for Coumadin is:   Stroke Prevention Because Of Atrial Fibrillation  What test will check on my response to Coumadin? While on Coumadin (warfarin) you will need to have an INR test regularly to ensure that your dose is keeping you in the desired range. The INR (international normalized ratio) number is calculated from the result of the laboratory test called prothrombin time (PT).  If an INR APPOINTMENT HAS NOT ALREADY BEEN MADE FOR YOU please schedule an appointment to have this lab work done by your health care provider within 7 days. Your INR goal is usually a number between:  2 to 3 or your provider may give you a more narrow range like 2-2.5.  Ask your health care provider during an office visit what your goal INR is.  What  do you need to  know  About  COUMADIN? Take Coumadin (warfarin) exactly as prescribed by your healthcare provider about  the same time each day.  DO NOT stop taking without talking to the doctor who prescribed the medication.  Stopping without other blood clot prevention medication to take the place of Coumadin may increase your risk of developing a new clot or stroke.  Get refills before you run out.  What do you do if you miss a dose? If you miss a dose, take it as soon as you remember on the same day then continue your regularly scheduled regimen the next day.  Do not take two doses of Coumadin at the same time.  Important Safety Information A possible side effect of Coumadin (Warfarin) is an increased risk of bleeding. You should call your healthcare provider right away if you experience any of the following:   Bleeding from an injury or your nose that does not stop.   Unusual colored urine (red or dark brown) or unusual colored stools (red or black).   Unusual bruising for unknown reasons.   A serious fall or if you hit your head (even if there is no bleeding).  Some foods or medicines interact with Coumadin (warfarin) and might alter your response to warfarin. To help avoid this:   Eat a balanced diet, maintaining a consistent amount of Vitamin K.   Notify your provider about major diet changes you plan to make.   Avoid alcohol or limit your intake  to 1 drink for women and 2 drinks for men per day. (1 drink is 5 oz. wine, 12 oz. beer, or 1.5 oz. liquor.)  Make sure that ANY health care provider who prescribes medication for you knows that you are taking Coumadin (warfarin).  Also make sure the healthcare provider who is monitoring your Coumadin knows when you have started a new medication including herbals and non-prescription products.  Coumadin (Warfarin)  Major Drug Interactions  Increased Warfarin Effect Decreased Warfarin Effect  Alcohol (large quantities) Antibiotics (esp. Septra/Bactrim, Flagyl, Cipro) Amiodarone (Cordarone) Aspirin (ASA) Cimetidine (Tagamet) Megestrol (Megace) NSAIDs  (ibuprofen, naproxen, etc.) Piroxicam (Feldene) Propafenone (Rythmol SR) Propranolol (Inderal) Isoniazid (INH) Posaconazole (Noxafil) Barbiturates (Phenobarbital) Carbamazepine (Tegretol) Chlordiazepoxide (Librium) Cholestyramine (Questran) Griseofulvin Oral Contraceptives Rifampin Sucralfate (Carafate) Vitamin K   Coumadin (Warfarin) Major Herbal Interactions  Increased Warfarin Effect Decreased Warfarin Effect  Garlic Ginseng Ginkgo biloba Coenzyme Q10 Green tea St. Johns wort    Coumadin (Warfarin) FOOD Interactions  Eat a consistent number of servings per week of foods HIGH in Vitamin K (1 serving =  cup)  Collards (cooked, or boiled & drained) Kale (cooked, or boiled & drained) Mustard greens (cooked, or boiled & drained) Parsley *serving size only =  cup Spinach (cooked, or boiled & drained) Swiss chard (cooked, or boiled & drained) Turnip greens (cooked, or boiled & drained)  Eat a consistent number of servings per week of foods MEDIUM-HIGH in Vitamin K (1 serving = 1 cup)  Asparagus (cooked, or boiled & drained) Broccoli (cooked, boiled & drained, or raw & chopped) Brussel sprouts (cooked, or boiled & drained) *serving size only =  cup Lettuce, raw (green leaf, endive, romaine) Spinach, raw Turnip greens, raw & chopped   These websites have more information on Coumadin (warfarin):  http://www.king-russell.com/; https://www.hines.net/;

## 2014-01-11 ENCOUNTER — Ambulatory Visit (INDEPENDENT_AMBULATORY_CARE_PROVIDER_SITE_OTHER): Payer: Self-pay | Admitting: *Deleted

## 2014-01-11 ENCOUNTER — Encounter (HOSPITAL_COMMUNITY): Payer: Self-pay | Admitting: Cardiology

## 2014-01-11 DIAGNOSIS — J101 Influenza due to other identified influenza virus with other respiratory manifestations: Secondary | ICD-10-CM

## 2014-01-11 DIAGNOSIS — I059 Rheumatic mitral valve disease, unspecified: Secondary | ICD-10-CM

## 2014-01-11 DIAGNOSIS — I519 Heart disease, unspecified: Secondary | ICD-10-CM

## 2014-01-11 DIAGNOSIS — J111 Influenza due to unidentified influenza virus with other respiratory manifestations: Principal | ICD-10-CM

## 2014-01-11 DIAGNOSIS — I4891 Unspecified atrial fibrillation: Secondary | ICD-10-CM

## 2014-01-11 DIAGNOSIS — I5043 Acute on chronic combined systolic (congestive) and diastolic (congestive) heart failure: Secondary | ICD-10-CM | POA: Diagnosis present

## 2014-01-11 HISTORY — DX: Influenza due to other identified influenza virus with other respiratory manifestations: J10.1

## 2014-01-11 LAB — PROTIME-INR
INR: 2.06 — AB (ref 0.00–1.49)
Prothrombin Time: 22.6 seconds — ABNORMAL HIGH (ref 11.6–15.2)

## 2014-01-11 MED ORDER — POTASSIUM CHLORIDE CRYS ER 20 MEQ PO TBCR
40.0000 meq | EXTENDED_RELEASE_TABLET | Freq: Once | ORAL | Status: AC
  Administered 2014-01-11: 40 meq via ORAL
  Filled 2014-01-11: qty 2

## 2014-01-11 MED ORDER — FUROSEMIDE 80 MG PO TABS
80.0000 mg | ORAL_TABLET | Freq: Every day | ORAL | Status: DC
  Administered 2014-01-11 – 2014-01-12 (×2): 80 mg via ORAL
  Filled 2014-01-11 (×2): qty 1

## 2014-01-11 MED ORDER — WARFARIN SODIUM 7.5 MG PO TABS
15.0000 mg | ORAL_TABLET | Freq: Once | ORAL | Status: AC
  Administered 2014-01-11: 15 mg via ORAL
  Filled 2014-01-11: qty 2

## 2014-01-11 NOTE — Progress Notes (Signed)
Physical Therapy Treatment Patient Details Name: Misty Gray MRN: [id number] DOB: [birth year] Today's Date: 01/11/2014 Time: 3967-2897 PT Time Calculation (min): 16 min  PT Assessment / Plan / Recommendation  History of Present Illness Pt adm with chest pain and found to be +flu.  Pt also with hx of heart failure.   PT Comments   Patient very motivated and progressing well. Continue with current POC. Encourage daily ambulation with staff  Follow Up Recommendations  No PT follow up     Does the patient have the potential to tolerate intense rehabilitation     Barriers to Discharge        Equipment Recommendations  None recommended by PT    Recommendations for Other Services    Frequency Min 3X/week   Progress towards PT Goals Progress towards PT goals: Progressing toward goals  Plan Current plan remains appropriate    Precautions / Restrictions Precautions Precautions: Fall       Mobility  Bed Mobility Supine to sit: Modified independent (Device/Increase time) Transfers Overall transfer level: Modified independent Ambulation/Gait Ambulation/Gait assistance: Min guard Ambulation Distance (Feet): 200 Feet Assistive device: None Gait Pattern/deviations: Wide base of support;Step-through pattern;Decreased stride length Gait velocity: slow Gait velocity interpretation: Below normal speed for age/gender General Gait Details: no LOB this session with gait or turning    Exercises     PT Diagnosis:    PT Problem List:   PT Treatment Interventions:     PT Goals (current goals can now be found in the care plan section)    Visit Information  Last PT Received On: 01/11/14 Assistance Needed: +1 History of Present Illness: Pt adm with chest pain and found to be +flu.  Pt also with hx of heart failure.    Subjective Data      Cognition  Cognition Arousal/Alertness: Awake/alert Behavior During Therapy: WFL for tasks assessed/performed Overall Cognitive Status: Within  Functional Limits for tasks assessed    Balance     End of Session PT - End of Session Activity Tolerance: Patient tolerated treatment well Patient left: in bed;with call bell/phone within reach;with family/visitor present Nurse Communication: Mobility status   GP     Fredrich Birks 01/11/2014, 2:59 PM 01/11/2014 Fredrich Birks PTA [phone] pager [phone] office

## 2014-01-11 NOTE — Progress Notes (Signed)
  Echocardiogram 2D Echocardiogram has been performed.  Arvil Chaco 01/11/2014, 12:48 PM

## 2014-01-11 NOTE — Progress Notes (Signed)
ANTICOAGULATION CONSULT NOTE - Follow Up Consult  Pharmacy Consult for coumadin Indication: atrial fibrillation  Allergies  Allergen Reactions  . Aspirin Hives  . Penicillins Rash and Other (See Comments)    "doesn't do the job it's suppose to do; I get real hyperactive and I break out in a rash"    Patient Measurements: Height: 5\' 2"  (157.5 cm) Weight: 218 lb 3.2 oz (98.975 kg) IBW/kg (Calculated) : 50.1   Vital Signs: Temp: 99.2 F (37.3 C) (01/27 0800) Temp src: Oral (01/27 0800) BP: 124/81 mmHg (01/27 0800) Pulse Rate: 85 (01/27 0800)  Labs:  Recent Labs  01/09/14 1603 01/09/14 2350 01/10/14 0430 01/10/14 0850 01/11/14 0532  HGB 14.5  --  12.9  --   --   HCT 43.5  --  38.9  --   --   PLT 131*  --  93*  --   --   LABPROT 34.2*  --  27.5*  --  22.6*  INR 3.55*  --  2.67*  --  2.06*  CREATININE 0.77  --  0.65  --   --   TROPONINI  --  <0.30 <0.30 <0.30  --     Estimated Creatinine Clearance: 78.2 ml/min (by C-G formula based on Cr of 0.65).  Assessment: Patient is a 63 y.o F on coumadin for Afib.  INR is therapeutic but decreased from 2.67 to 2.06 today. No bleeding documented.  Goal of Therapy:  INR 2-3    Plan:  1) coumadin 15mg  PO x1 today  Zareah Hunzeker P 01/11/2014,1:16 PM

## 2014-01-11 NOTE — Progress Notes (Signed)
Subjective: No chest pain today.  No SOB.  Objective: Vital signs in last 24 hours: Temp:  [98.1 F (36.7 C)-102.9 F (39.4 C)] 99.2 F (37.3 C) (01/27 0800) Pulse Rate:  [73-87] 85 (01/27 0800) Resp:  [16-20] 16 (01/27 0800) BP: (94-124)/(50-84) 124/81 mmHg (01/27 0800) SpO2:  [95 %-100 %] 95 % (01/27 0800) Weight:  [218 lb 3.2 oz (98.975 kg)] 218 lb 3.2 oz (98.975 kg) (01/27 0921) Weight change:  Last BM Date: 01/09/14 Intake/Output from previous day: -3520 (total since admit -5755) wt 218.3 down from 219 01/26 0701 - 01/27 0700 In: 480 [P.O.:480] Out: 4000 [Urine:4000] Intake/Output this shift: Total I/O In: -  Out: 400 [Urine:400]  PE: General:Pleasant affect, NAD Skin:Warm and dry, brisk capillary refill HEENT:normocephalic, sclera clear, mucus membranes moist Heart:S1S2 RRR without murmur, gallup, rub or click Lungs: with rales rt base, no rhonchi, or wheezes AVW:UJWJXAbd:obese, soft, non tender, + BS, do not palpate liver spleen or masses Ext:no lower ext edema, 2+ pedal pulses Neuro:alert and oriented, MAE, follows commands, + facial symmetry   Tele: afib with pacing and sensing.    Lab Results:  Recent Labs  01/09/14 1603 01/10/14 0430  WBC 6.4 5.2  HGB 14.5 12.9  HCT 43.5 38.9  PLT 131* 93*   BMET  Recent Labs  01/09/14 1603 01/10/14 0430  NA 137 137  K 4.1 3.8  CL 99 99  CO2 24 23  GLUCOSE 102* 91  BUN 23 21  CREATININE 0.77 0.65  CALCIUM 9.1 8.8    Recent Labs  01/10/14 0430 01/10/14 0850  TROPONINI <0.30 <0.30    Lab Results  Component Value Date   CHOL 126 01/10/2014   HDL 56 01/10/2014   LDLCALC 57 01/10/2014   LDLDIRECT 99 11/05/2011   TRIG 64 01/10/2014   CHOLHDL 2.3 01/10/2014   Lab Results  Component Value Date   HGBA1C 6.0* 01/09/2014     Lab Results  Component Value Date   TSH 0.331* 01/09/2014    Hepatic Function Panel  Recent Labs  01/09/14 1603  PROT 7.5  ALBUMIN 3.6  AST 59*  ALT 41*  ALKPHOS 107   BILITOT 0.7    Recent Labs  01/10/14 0430  CHOL 126   No results found for this basename: PROTIME,  in the last 72 hours     Studies/Results: Dg Chest 2 View  01/09/2014   CLINICAL DATA:  Cough.  Chest pain.  Cardiomyopathy.  EXAM: CHEST  2 VIEW  COMPARISON:  02/11/2013  FINDINGS: Moderate cardiomegaly is stable. Dual lead transvenous pacemaker remains in place. Both lungs remain clear. No evidence of pulmonary infiltrate or edema. No pleural effusion identified.  IMPRESSION: Stable cardiomegaly.  No active lung disease.   Electronically Signed   By: Myles RosenthalJohn  Stahl M.D.   On: 01/09/2014 15:48    Medications: I have reviewed the patient's current medications. Scheduled Meds: . digoxin  125 mcg Oral Daily  . enalapril  5 mg Oral Daily  . metoprolol tartrate  25 mg Oral BID  . oseltamivir  75 mg Oral BID  . pantoprazole  40 mg Oral Daily  . PHENobarbital  97.2 mg Oral QHS  . spironolactone  25 mg Oral Daily  . Warfarin - Pharmacist Dosing Inpatient   Does not apply q1800   Continuous Infusions:  PRN Meds:.acetaminophen, guaiFENesin, morphine injection, ondansetron (ZOFRAN) IV  Assessment/Plan: Principal Problem:   Chest pain, negative MI Active Problems:  CARDIOMYOPATHY, PRIMARY, DILATED   PACEMAKER, PERMANENT   Heart failure   Influenza A  PLAN: 57 YOF with history of NICM (LVEF 25 to 30% in May 2013), SSS (s/p DDD pacer), atiral fibriallation, syncope, CVA, seizure disorder.  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 Patient tried to walk across the street to her house Usually takes 5 min Took her 15. Episode eased off. Then began to feel chilled. Took a cold relief medicine that had asprin in it by accident Son brought her to ER because she is allergic to ASA. Pt with fever, blood cultures drawn.  + volume overload IV lasix given X1  with good diuresis. Resume home po tomorrow.  BP in the 90s systolic after IV lasix.  Echo Pending.   Negative MI.  INR therapeutic 2.06  +influenza A Low grad fever.   Echo pending, may need outpt stress test once she has recovered from flu.  MD to see for further recommendations.    LOS: 2 days   Time spent with pt. :15 minutes. [facility]  Nurse Practitioner Certified Pager [phone] or after 5pm and on weekends call [phone] 01/11/2014, 10:05 AM Patient seen and examined. I agree with the assessment and plan as detailed above. See also my additional thoughts below.   There is still evidence of some volume overload. Continue to watch his volume status and his renal function carefully.  There were 5 beats of wide complex tachycardia with a rate of 150. No labs are available yet from today. I will give the patient an extra 40 mEq of potassium. Chemistry will be checked tomorrow. Will also check to be sure that the patient's echo is to be done.  Willa Rough, MD, [facility] 01/11/2014 1:17 PM

## 2014-01-11 NOTE — Progress Notes (Signed)
FMTS Attending Admission Note: Barrett Holthaus MD [phone] pager office [phone] I  have seen and examined this patient, reviewed their chart. I have discussed this patient with the resident. I agree with the resident's findings, assessment and care plan. 

## 2014-01-11 NOTE — Telephone Encounter (Signed)
Patient currently hospitalized with sob/chest pain thought to be due to volume overload and Influenza. Briefly discussed lab results with patient. No changes made to medications as acutely hospitalized. Will consider statin therapy when patient follows up in office.

## 2014-01-11 NOTE — Progress Notes (Addendum)
Family Medicine Teaching [facility] Admission History and Physical Service Pager: [phone]  [name] Medical record number: [id number] Date of birth: 1951-01-07 Age: 63 y.o. Gender: female  Primary Care Provider: Uvaldo Rising, MD Consultants: None now, Cards- signed off Code Status: Full  Pt Overview and Major Events to Date:  1/26: febrile, Flu (+), Start Tamiflu, ACS rule-out complete, continue IV Lasix w/ good response, B-blocker adjusted for low BPs, ECHO pending 1/27: afebrile overnight, home dose of IV Lasix, awaiting ECHO, dc tmrw if stays afebrile  Assessment and Plan: Misty Gray is a 63 y.o. female presenting with Chest pain and chronic stable dsypnea here with evidence of volume overload  . PMH is significant for NICM, SSS and A fib s/p pacemaker implantation, systolic CHF, OSA, hypertension, seizure disorder, and history of stroke.  # Chest pain - Ruled out for ACS. Typical exertional chest pain with atypical features now resolved. Known history of nonischemic cardiomyopathy and systolic heart failure. EKG from yesterday with several paced beats limiting evaluation in aVR through aVF, new inverted T wave in V3, stable inverted T waves in V4 through V6. Continues to have bibasilar crackles. - Cardiology recommends continuing IV Lasix, coumadin, HF medications, consider holding BP meds - Troponins neg x3 - No aspirin with documented allergy  #Influenza - Last fever 1/26 102.9, Influenza A (+) - Continue Tamiflu 75mg  BID (day 2/5)  # Chronic systolic heart failure- some evidence of volume overload - Last EF was 25-30% in June of 2013, she had akinesis of the entire inferior wall - Total UOP 7.1L - Continue home dose Lasix 80 mg qd - Still awaiting repeat ECHO - Metoprolol dose changed yesterday, recent BPs adequate, keep current dose - Continue digoxin and ACE inhibitor - BNP was initially elevated to 4390  #SSS and Afib s/p pacemaker implant -  Rate controlled on current regimen - INR 3.5>>2.67>>2.06  - Coumadin per pharmacy and Cards recomendations  - Continue metoprolol and digoxin  #Hypertension - BPs stable at 100's/50's - Continue ACE inhibitor and spironolactone - B-blocker adjustment per above  Chronic medical conditions: Seizure disorder- Continue phenobarbital GERD- continue PPI  FEN/GI: SLIV, cardiac diet, PO PPI Prophylaxis: Heparin  Disposition: Continue home diuresis regimen, awaiting ECHO, dispo tomorrow if pt remains afebrile  Subjective: Patient was sitting up in bed, breathing comfortably. States she had an okay night with intermitttent low fevers  Objective: BP 106/69  Pulse 77  Temp(Src) 99.5 F (37.5 C) (Oral)  Resp 18  Ht 5\' 2"  (1.575 m)  Wt 219 lb 3.2 oz (99.428 kg)  BMI 40.08 kg/m2  SpO2 95% Exam: Gen: NAD, alert, cooperative with exam, mildly ill appearing female HEENT: NCAT, PERRL, MMM CV: Irregularly irregular rhythm, good S1/S2, no murmur Resp: Crackles most prominent in left lower lung but also present in right lower lung, nonlabored, speaking in complete sentences Abd: SNTND, obese, BS present, no guarding or organomegaly, well-healed scar below umbilicus Ext: trace edema bilaterally, 2+ DP pulses Neuro: Alert and oriented, No gross deficits, normal speech   Labs and Imaging: CBC BMET   Recent Labs Lab 01/10/14 0430  WBC 5.2  HGB 12.9  HCT 38.9  PLT 93*    Recent Labs Lab 01/10/14 0430  NA 137  K 3.8  CL 99  CO2 23  BUN 21  CREATININE 0.65  GLUCOSE 91  CALCIUM 8.8     Lipid Panel     Component Value Date/Time   CHOL 126  01/10/2014 0430   TRIG 64 01/10/2014 0430   HDL 56 01/10/2014 0430   CHOLHDL 2.3 01/10/2014 0430   VLDL 13 01/10/2014 0430   LDLCALC 57 01/10/2014 0430   Hgb A1c 6.0 TSH 0.331 Influenza A (+) INR 3.55 >>2.67>>2.06  Pro BNP 4390.0 Troponin neg x3 Digoxin level 0.6  Blood Cx: NGTD  DG chest 1/25 IMPRESSION:  Stable cardiomegaly. No  active lung disease. - Small left pleural effusion on my read  Thank you,  Feliberto HartsAaren Hunt, Med Student 01/11/2014, 8:22 AM  I have examined the patient and agree with the medical student's documentation above. My annotations are in blue and my findings are below.   Gen: NAD, alert, cooperative with exam HEENT: NCAT CV: RRR, good S1/S2, no murmur Resp: crackles at BL bases, faint exp wheeze., non labored Abd: SNTND, BS present, no guarding or organomegaly Ext: 1+ pitting edema BL Neuro: Alert and oriented, No gross deficits   Misty Gray is a 63 y/o femela with PMH NICM, SSS and A fib s/p pacemaker implantation, systolic CHF, OSA, hypertension, seizure disorder, and history of stroke here with chest pain and flu. Her chest pain has been ruled out for MI and she has diuresed well. She had some evidence of volkume overload without flourid CHF exacerbation and has persistent edema and crackles on exam. She is approaching readiness to go home but we will plan to keep her for at least tonight as her last fever was less than 24 hours ago. Her fever is attributed to influenza but given her multiple comorbidities we will keep her for close obs.   Murtis SinkSam Lyn Deemer, MD [facility] Resident, PGY-2 01/11/2014, 1:13 PM

## 2014-01-12 ENCOUNTER — Encounter: Payer: Self-pay | Admitting: Internal Medicine

## 2014-01-12 DIAGNOSIS — I509 Heart failure, unspecified: Secondary | ICD-10-CM

## 2014-01-12 DIAGNOSIS — I5043 Acute on chronic combined systolic (congestive) and diastolic (congestive) heart failure: Secondary | ICD-10-CM

## 2014-01-12 DIAGNOSIS — K219 Gastro-esophageal reflux disease without esophagitis: Secondary | ICD-10-CM

## 2014-01-12 LAB — BASIC METABOLIC PANEL
BUN: 18 mg/dL (ref 6–23)
CALCIUM: 8.8 mg/dL (ref 8.4–10.5)
CHLORIDE: 101 meq/L (ref 96–112)
CO2: 23 meq/L (ref 19–32)
CREATININE: 0.72 mg/dL (ref 0.50–1.10)
GFR calc Af Amer: >90 mL/min (ref >90)
GFR calc non Af Amer: 89 mL/min — ABNORMAL LOW (ref >90)
Glucose, Bld: 89 mg/dL (ref 70–99)
Potassium: 4.5 mEq/L (ref 3.7–5.3)
SODIUM: 142 meq/L (ref 137–147)

## 2014-01-12 LAB — PRO B NATRIURETIC PEPTIDE: PRO B NATRI PEPTIDE: 1363 pg/mL — AB (ref 0–125)

## 2014-01-12 LAB — PROTIME-INR
INR: 1.91 — ABNORMAL HIGH (ref 0.00–1.49)
Prothrombin Time: 21.3 seconds — ABNORMAL HIGH (ref 11.6–15.2)

## 2014-01-12 MED ORDER — METOPROLOL SUCCINATE ER 50 MG PO TB24: 50.0000 mg | ORAL_TABLET | Freq: Every day | ORAL | Status: DC

## 2014-01-12 MED ORDER — OSELTAMIVIR PHOSPHATE 75 MG PO CAPS: 75.0000 mg | ORAL_CAPSULE | Freq: Two times a day (BID) | ORAL | Status: DC

## 2014-01-12 NOTE — Discharge Summary (Signed)
[facility] Discharge Summary  [name] Medical record number: [id number] Date of birth: [birth year] Age: 63 y.o. Gender: female Date of Admission: 01/09/2014  Date of Discharge: 01/12/2014 Admitting Physician: Zigmund Gottron, MD  Primary Care Provider: Lupita Dawn, MD Consultants: None  Indication for Hospitalization: Chest pain  Discharge Diagnoses/Problem List:  Chest pain Influenza Chronic systolic heart failure Sick Sinus syndrome Chronic Atrial fibrilation HTN Seizure disorder GERD  Disposition: Home  Discharge Condition: Stable  Brief Hospital Course:  Misty Gray is a 63 y.o. female presenting with Chest pain and chronic stable dsypnea here with evidence of volume overload . PMH is significant for NICM, SSS and A fib s/p pacemaker implantation, systolic CHF, OSA, hypertension, seizure disorder, and history of stroke.   # Chest pain She presented with exertional chest pain with atypical features and considering her comorbidities she was admitted for rule out ACS. She had some evidence for volume overload with an elevated BNP around 4000 the chest x-ray without significant pulmonary vascular congestion. She was treated with IV lasix once daily with good UOP and ACS was ruled out with negative troponins and unchanged EKG. Cardiology was involved. No ASA was given due to her allergy.   #Influenza  Found to be flu + and then spiked fevers. Blood cultures were negative and she was treated with tamiflu 75 BID.   # Chronic systolic heart failure- Get some evidence of volume overload with an elevated BNP but an overall clear chest x-ray. An echo was repeated finding EF of 30-35% and mild to moderate mitral regurg. She was treated with IV Lasix at 40 mg daily and transition back to home regimen of 80 mg by mouth daily before discharge. She was met negative 7.1 L and her BNP improved to 1300.  #SSS and Afib s/p pacemaker  implant  She was rate controlled with frequent paced beats while inpatient. Coumadin was managed by pharmacy. She is initially supratherapeutic with an INR 3.5 which was 1.9 on discharge. She was restarted on her home Coumadin regimen at 10 mg daily except for Monday Wednesday Friday when she takes 50 mg. Her digoxin level was 0.6. We continued metoprolol and digoxin on DC.   #Hypertension  Her BPs were reasonable throughout admission the continued her ACE inhibitor, spurlike tone, and metoprolol.  Chronic medical conditions:  Seizure disorder- continued phenobarbital, no changes GERD- continue PPI  Issues for Follow Up:  - Follow blood cultures, they're negative to date - Continue Close monitoring of fluid status - Needs close follow up with Electrophysiology, Dr. Rayann Heman  Significant Procedures: None  Significant Labs and Imaging:   Recent Labs Lab 01/09/14 1603 01/10/14 0430  WBC 6.4 5.2  HGB 14.5 12.9  HCT 43.5 38.9  PLT 131* 93*    Recent Labs Lab 01/09/14 1603 01/10/14 0430 01/12/14 0420  NA 137 137 142  K 4.1 3.8 4.5  CL 99 99 101  CO2 _0 GLUCOSE 102* 91 89  BUN _1 CREATININE 0.77 0.65 0.72  CALCIUM 9.1 8.8 8.8  ALKPHOS 107  --   --   AST 59*  --   --   ALT 41*  --   --   ALBUMIN 3.6  --   --     ProBNP 4390 (01/09/2014), 1363 (01/12/2014)   Recent Labs Lab 01/09/14 1603 01/10/14 0430 01/11/14 0532 01/12/14 0420  INR 3.55* 2.67* 2.06* 1.91*  Results/Tests Pending at Time of Discharge: Blood cultures  Discharge Medications:    Medication List         digoxin 0.125 MG tablet  Commonly known as:  LANOXIN  Take 1 tablet (125 mcg total) by mouth daily.     enalapril 5 MG tablet  Commonly known as:  VASOTEC  Take 1 tablet (5 mg total) by mouth daily.     fluticasone 50 MCG/ACT nasal spray  Commonly known as:  FLONASE  Place 2 sprays into the nose daily as needed. For congestion.     furosemide 80 MG tablet  Commonly known  as:  LASIX  Take 1 tablet (80 mg total) by mouth daily.     halobetasol 0.05 % cream  Commonly known as:  ULTRAVATE  Apply topically 2 (two) times daily.     ibuprofen 200 MG tablet  Commonly known as:  ADVIL,MOTRIN  Take 200 mg by mouth every 6 (six) hours as needed for moderate pain.     loratadine 10 MG tablet  Commonly known as:  CLARITIN  Take 1 tablet (10 mg total) by mouth daily.     metoprolol succinate 50 MG 24 hr tablet  Commonly known as:  TOPROL-XL  Take 1 tablet (50 mg total) by mouth daily. Take with or immediately following a meal.     omeprazole 20 MG capsule  Commonly known as:  PRILOSEC  Take 1 capsule (20 mg total) by mouth daily.     oseltamivir 75 MG capsule  Commonly known as:  TAMIFLU  Take 1 capsule (75 mg total) by mouth 2 (two) times daily.     PHENobarbital 32.4 MG tablet  Commonly known as:  LUMINAL  Take 3 tablets by mouth at bedtime.     spironolactone 25 MG tablet  Commonly known as:  ALDACTONE  Take 1 tablet (25 mg total) by mouth daily.     warfarin 5 MG tablet  Commonly known as:  COUMADIN  Take 10-15 mg by mouth daily. Take 73m daily except 165mon Monday, Wednesday, and Friday        Discharge Instructions: Please refer to Patient Instructions section of EMR for full details.  Patient was counseled important signs and symptoms that should prompt return to medical care, changes in medications, dietary instructions, activity restrictions, and follow up appointments.   Follow-Up Appointments: Follow-up Information   Follow up with FLLupita DawnMD On 01/17/2014. (Scheduled at 1153   Specialty:  Family Medicine   Contact information:   [address] [phone]     Follow up with FAPueblon 01/17/2014. (For INR check at 11am)    Contact information:   [phone]     Schedule an appointment as soon as possible for a visit with JaThompson GrayerMD.  (call today or tomorrow for appt in 1-2 weeks. )    Specialty:  Cardiology   Contact information:   [phone]       SaTimmothy EulerMD 01/12/2014, 2:12 PM PGY-2, [place]

## 2014-01-12 NOTE — Progress Notes (Signed)
Patient ID: Misty Gray, female   DOB: 01-Apr-1951, 63 y.o.   MRN: [id number]    SUBJECTIVE:  The patient has stabilized. Potassium is up to 4.5 today.   Filed Vitals:   01/11/14 2104 01/12/14 0644 01/12/14 1038 01/12/14 1041  BP: 101/68 97/61 88/60    Pulse: 70 71  68  Temp: 99.7 F (37.6 C) 97.8 F (36.6 C)    TempSrc: Oral Oral    Resp: 18 18    Height:      Weight:  213 lb 8 oz (96.843 kg)    SpO2: 96% 96%       Intake/Output Summary (Last 24 hours) at 01/12/14 1125 Last data filed at 01/11/14 2113  Gross per 24 hour  Intake    800 ml  Output   2200 ml  Net  -1400 ml    LABS: Basic Metabolic Panel:  Recent Labs  11/91/4701/26/15 0430 01/12/14 0420  NA 137 142  K 3.8 4.5  CL 99 101  CO2 23 23  GLUCOSE 91 89  BUN 21 18  CREATININE 0.65 0.72  CALCIUM 8.8 8.8   Liver Function Tests:  Recent Labs  01/09/14 1603  AST 59*  ALT 41*  ALKPHOS 107  BILITOT 0.7  PROT 7.5  ALBUMIN 3.6   No results found for this basename: LIPASE, AMYLASE,  in the last 72 hours CBC:  Recent Labs  01/09/14 1603 01/10/14 0430  WBC 6.4 5.2  HGB 14.5 12.9  HCT 43.5 38.9  MCV 94.6 94.0  PLT 131* 93*   Cardiac Enzymes:  Recent Labs  01/09/14 2350 01/10/14 0430 01/10/14 0850  TROPONINI <0.30 <0.30 <0.30   BNP: No components found with this basename: POCBNP,  D-Dimer: No results found for this basename: DDIMER,  in the last 72 hours Hemoglobin A1C:  Recent Labs  01/09/14 2350  HGBA1C 6.0*   Fasting Lipid Panel:  Recent Labs  01/10/14 0430  CHOL 126  HDL 56  LDLCALC 57  TRIG 64  CHOLHDL 2.3   Thyroid Function Tests:  Recent Labs  01/09/14 2350  TSH 0.331*    RADIOLOGY: Dg Chest 2 View  01/09/2014   CLINICAL DATA:  Cough.  Chest pain.  Cardiomyopathy.  EXAM: CHEST  2 VIEW  COMPARISON:  02/11/2013  FINDINGS: Moderate cardiomegaly is stable. Dual lead transvenous pacemaker remains in place. Both lungs remain clear. No evidence of pulmonary infiltrate or  edema. No pleural effusion identified.  IMPRESSION: Stable cardiomegaly.  No active lung disease.   Electronically Signed   By: Myles RosenthalJohn  Stahl M.D.   On: 01/09/2014 15:48    PHYSICAL EXAM  patient is oriented to person time and place. Affect is normal. I spoke with family member in the room. Lungs reveal scattered rhonchi. Cardiac exam reveals S1-S2. Abdomen is protuberant but soft. There is no significant peripheral edema.   TELEMETRY: I have reviewed telemetry today January 04, 2014. There is no further wide complex tachycardia.  ASSESSMENT AND PLAN:    Chest pain, negative MI     No further workup.    CARDIOMYOPATHY, PRIMARY, DILATED     EF 30-35% by current echo.    PACEMAKER, PERMANENT   Atrial fibrillation   Influenza     Acute on chronic combined systolic and diastolic CHF (congestive heart failure)     Patient volume status is stabilized. She can be discharged home. She needs early post hospital followup with Dr. Johney FrameAllred of the cardiology team or one of the cardiology  extenders.  Okay for patient to be discharged home.   Willa Rough 01/12/2014 11:25 AM

## 2014-01-12 NOTE — ED Provider Notes (Signed)
Medical screening examination/treatment/procedure(s) were conducted as a shared visit with resident-physician practitioner(s) and myself.  I personally evaluated the patient during the encounter.  Pt is a 63 y.o. female with pmhx as above presenting with CP, SOB, chills, cough.  Pt found to have elevated BNP, BLLE edema, crackles at bases, but non-toxic on PE. First trop nml, no acute ischemic changes on EKG.  Pt will be admitted to [facility] medicine for mgmt of CHF & ACS r/o.    EKG Interpretation    Date/Time:  Sunday January 09 2014 15:15:01 EST Ventricular Rate:  74 PR Interval:    QRS Duration: 78 QT Interval:  368 QTC Calculation: 408 R Axis:   17 Text Interpretation:  Demand pacemaker; interpretation is based on intrinsic rhythm Undetermined rhythm Low voltage QRS ST \\T \ T wave abnormality, consider inferior ischemia ST \\T \ T wave abnormality, consider anterolateral ischemia Abnormal ECG ED PHYSICIAN INTERPRETATION AVAILABLE IN [facility] Confirmed by TEST, RECORD ([id number]) on 01/11/2014 9:27:43 AM             Shanna Cisco, MD 01/12/14 [phone]

## 2014-01-12 NOTE — Discharge Summary (Signed)
Family Medicine Teaching Service  Discharge Note : Attending Tritia Endo MD Pager [phone] Office [phone] I have seen and examined this patient, reviewed their chart and discussed discharge planning wit the resident at the time of discharge. I agree with the discharge plan as above.  

## 2014-01-12 NOTE — Progress Notes (Signed)
FMTS Attending Daily Note: Denny Levy MD [phone] pager office [phone] I  have seen and examined this patient, reviewed their chart. I have discussed this patient with the resident. I agree with the resident's findings, assessment and care plan. We'll see how she feels today, she may be able to be discharged. Her fluid status is near baseline. Flu positive seems to be feeling better.

## 2014-01-12 NOTE — Progress Notes (Signed)
Family Medicine Teaching [facility] Admission History and Physical Service Pager: [phone]  [name] Medical record number: [id number] Date of birth: Feb 24, 1951 Age: 63 y.o. Gender: female  Primary Care Provider: Uvaldo Rising, MD Consultants: None now, Cards- signed off Code Status: Full  Pt Overview and Major Events to Date:  1/26: febrile, Flu (+), Start Tamiflu, ACS rule-out complete, continue IV Lasix w/ good response, B-blocker adjusted for low BPs, ECHO pending 1/27: afebrile overnight, home dose of IV Lasix, awaiting ECHO, dc tmrw if stays afebrile  Assessment and Plan: ALITA IIAMS is a 63 y.o. female presenting with Chest pain and chronic stable dsypnea here with evidence of volume overload  . PMH is significant for NICM, SSS and A fib s/p pacemaker implantation, systolic CHF, OSA, hypertension, seizure disorder, and history of stroke.  # Chest pain - Ruled out for ACS. - Cardiology recommends continuing IV Lasix, coumadin, HF medications, consider holding BP meds - Troponins neg x3 - No aspirin with documented allergy  #Influenza  - Last fever 1/26 102.9, Influenza A (+) - Continue Tamiflu 75mg  BID (day 3/5)  # Chronic systolic heart failure - some evidence of volume overload, BNP improved from 4400 to 1300 - Repeat Echo with EF 30-35%, Mild to mod mitral regurg, mildly increased pulmonary pressure - Net output 7.1 L - Continue home dose Lasix 80 mg qd - Metoprolol dose changed yesterday, recent BPs adequate, keep current dose - Continue digoxin and ACE inhibitor - BNP was initially elevated to 4390  #SSS and Afib s/p pacemaker implant - Rate controlled on current regimen - INR 3.5>>2.67>>2.06 >>1.91 - Coumadin per pharmacy  - Continue metoprolol and digoxin  #Hypertension - BPs stable  - Continue ACE inhibitor and spironolactone - B-blocker adjustment per above  Chronic medical conditions: Seizure disorder- Continue  phenobarbital GERD- continue PPI  FEN/GI: SLIV, cardiac diet, PO PPI Prophylaxis: Heparin  Disposition: Continue home diuresis regimen, likely home today  Subjective: Sitting up in bed about to eat. Reports baseline breathing and good UOP, denies chest pain. Overall feeling much better.   Objective: BP 97/61  Pulse 71  Temp(Src) 97.8 F (36.6 C) (Oral)  Resp 18  Ht 5\' 2"  (1.575 m)  Wt 213 lb 8 oz (96.843 kg)  BMI 39.04 kg/m2  SpO2 96% Exam: Gen: NAD, alert, cooperative with exam, sitting up to eat breakfast HEENT: NCAT, PERRL CV: Irregularly irregular rhythm, good S1/S2, no murmur Resp: soft crackles at BL bases, nonlabored, speaking in complete sentences Abd: SNTND, obese, BS present, no guarding or organomegaly Ext: trace edema bilaterally Neuro: Alert and oriented, No gross deficits, normal speech   Labs and Imaging: CBC BMET   Recent Labs Lab 01/10/14 0430  WBC 5.2  HGB 12.9  HCT 38.9  PLT 93*    Recent Labs Lab 01/12/14 0420  NA 142  K 4.5  CL 101  CO2 23  BUN 18  CREATININE 0.72  GLUCOSE 89  CALCIUM 8.8     Lipid Panel     Component Value Date/Time   CHOL 126 01/10/2014 0430   TRIG 64 01/10/2014 0430   HDL 56 01/10/2014 0430   CHOLHDL 2.3 01/10/2014 0430   VLDL 13 01/10/2014 0430   LDLCALC 57 01/10/2014 0430   Hgb A1c 6.0 TSH 0.331 Influenza A (+) INR 3.55 >>2.67>>2.06  Pro BNP 4390.0 Troponin neg x3 Digoxin level 0.6  Blood Cx: NGTD  DG chest 1/25 IMPRESSION:  Stable cardiomegaly. No active lung  disease. - Small left pleural effusion on my read   Murtis SinkSam Bradshaw, MD [facility] Resident, PGY-2 01/12/2014, 8:14 AM

## 2014-01-13 LAB — MDC_IDC_ENUM_SESS_TYPE_REMOTE
Battery Remaining Longevity: 73 mo
Battery Voltage: 2.95 V
Brady Statistic RV Percent Paced: 81 %
Date Time Interrogation Session: 20150129004022
Implantable Pulse Generator Model: 2210
Lead Channel Pacing Threshold Amplitude: 1.25 V
Lead Channel Pacing Threshold Pulse Width: 0.8 ms
Lead Channel Setting Sensing Sensitivity: 1.5 mV
MDC IDC MSMT LEADCHNL RV IMPEDANCE VALUE: 460 Ohm
MDC IDC MSMT LEADCHNL RV SENSING INTR AMPL: 8.1 mV
MDC IDC PG SERIAL: 7254513
MDC IDC SET LEADCHNL RV PACING AMPLITUDE: 2.5 V
MDC IDC SET LEADCHNL RV PACING PULSEWIDTH: 0.8 ms

## 2014-01-16 LAB — CULTURE, BLOOD (ROUTINE X 2)
CULTURE: NO GROWTH
Culture: NO GROWTH

## 2014-01-17 ENCOUNTER — Encounter: Payer: Self-pay | Admitting: Family Medicine

## 2014-01-17 ENCOUNTER — Encounter: Payer: Self-pay | Admitting: *Deleted

## 2014-01-17 ENCOUNTER — Ambulatory Visit (INDEPENDENT_AMBULATORY_CARE_PROVIDER_SITE_OTHER): Payer: Medicare Other | Admitting: Family Medicine

## 2014-01-17 ENCOUNTER — Ambulatory Visit (INDEPENDENT_AMBULATORY_CARE_PROVIDER_SITE_OTHER): Payer: Medicare Other | Admitting: *Deleted

## 2014-01-17 VITALS — BP 100/58 | HR 69 | Temp 98.5°F | Ht 62.0 in | Wt 216.4 lb

## 2014-01-17 DIAGNOSIS — J101 Influenza due to other identified influenza virus with other respiratory manifestations: Secondary | ICD-10-CM

## 2014-01-17 DIAGNOSIS — I4891 Unspecified atrial fibrillation: Secondary | ICD-10-CM

## 2014-01-17 DIAGNOSIS — J111 Influenza due to unidentified influenza virus with other respiratory manifestations: Secondary | ICD-10-CM

## 2014-01-17 DIAGNOSIS — R079 Chest pain, unspecified: Secondary | ICD-10-CM

## 2014-01-17 DIAGNOSIS — I10 Essential (primary) hypertension: Secondary | ICD-10-CM

## 2014-01-17 LAB — PROTIME-INR
INR: 4.39 — ABNORMAL HIGH
Prothrombin Time: 40.2 s — ABNORMAL HIGH (ref 11.6–15.2)

## 2014-01-17 LAB — POCT INR: INR: 5.9

## 2014-01-17 NOTE — Assessment & Plan Note (Signed)
Atrial fibrillation currently rate controlled. INR is supratherapeutic. -Will hold Coumadin today and tomorrow. -Patient to return for recheck of INR on 01/19/2014. -Coumadin will be adjusted based on repeat INR check

## 2014-01-17 NOTE — Patient Instructions (Addendum)
Elevated INR - do no take Coumadin today or tomorrow, return to office Wednesday for INR (01/19/14) check  Flu - you do not need to take the Tamiflu now, please do not pick up the prescription  Chest pain - follow up with your heart doctor  Low blood pressure - call the office immediately if you become lightheaded/pass out

## 2014-01-17 NOTE — Assessment & Plan Note (Signed)
Patient presents for hospital followup of chest pain. ACS was ruled out during her hospital stay. Patient is currently asymptomatic. Scheduled followup with cardiology in approximately 2 weeks.

## 2014-01-17 NOTE — Assessment & Plan Note (Signed)
Patient presents for hospital followup of chest pain and influenza. Her influenza symptoms are improving. -No further recommendations at this time.

## 2014-01-17 NOTE — Assessment & Plan Note (Signed)
Patient slightly hypertensive today. She is relatively asymptomatic will not make any additional changes to pharmacotherapy at this time. Will need to monitor clinically.

## 2014-01-17 NOTE — Progress Notes (Signed)
   Subjective:    Patient ID: Misty Gray, female    DOB: 03/29/1951, 63 y.o.   MRN: [id number]  HPI 63 year old female presents for hospital followup.  Patient was [facility] to [facility] on 01/09/2014 with chief complaint of chest pain. She has a significant history of chronic systolic heart failure, sick sinus syndrome, and chronic atrial fibrillation. She did have component of volume overload and was diuresis appropriately. ACS was ruled out with negative troponins and no evidence of ischemic changes on EKG. Her cardiologist was involved in her care and patient is scheduled for followup as an outpatient. Patient reports no current chest pain. Her swelling is at her baseline.  Patient was also identified to have influenza. She was started on Tamiflu during hospital stay. Patient reports that she did not pick up the Tamiflu at time of discharge however her symptoms have improved, she continues to have a daily cough which is improved from previous, she denies associated fevers or chills.  I have reviewed the discharge summary completed by the family medicine teaching service completed on 01/12/2014.  Reviewed patient's social history  Review of Systems  Constitutional: Negative for fever, chills and fatigue.  HENT: Negative for postnasal drip, rhinorrhea and sore throat.   Respiratory: Positive for cough. Negative for apnea.   Cardiovascular: Negative for chest pain and leg swelling.  Gastrointestinal: Negative for nausea, vomiting, diarrhea and abdominal distention.       Objective:   Physical Exam Vitals: Reviewed, slightly hypotensive General: Pleasant Caucasian female, no acute distress HEENT: Pupils equal and reactive to light bilaterally, extraocular movements are intact, moist mucous membranes Cardiac: Irregular regular rate and rhythm, S1 and S2 present, no murmurs Respiratory: Clear to auscultation bilaterally except for mild crackles in the bilateral bases, normal  effort, occasional cough during examination Abdomen: Soft, nontender, bowel sounds present Extremities: Trace bilateral lower extremity edema, 2+ radial pulses palpated bilaterally   Reviewed CBC and BMP at time of discharge. INR in office today 5.9    Assessment & Plan:  Please see problem specific assessment and plan.

## 2014-01-19 ENCOUNTER — Ambulatory Visit (INDEPENDENT_AMBULATORY_CARE_PROVIDER_SITE_OTHER): Payer: Medicare Other | Admitting: *Deleted

## 2014-01-19 DIAGNOSIS — I4891 Unspecified atrial fibrillation: Secondary | ICD-10-CM

## 2014-01-19 LAB — POCT INR: INR: 2.1

## 2014-01-24 ENCOUNTER — Ambulatory Visit (INDEPENDENT_AMBULATORY_CARE_PROVIDER_SITE_OTHER): Payer: Medicare Other | Admitting: *Deleted

## 2014-01-24 DIAGNOSIS — I4891 Unspecified atrial fibrillation: Secondary | ICD-10-CM

## 2014-01-24 LAB — POCT INR: INR: 2.3

## 2014-02-01 ENCOUNTER — Encounter: Payer: Medicare Other | Admitting: Cardiology

## 2014-02-04 ENCOUNTER — Encounter: Payer: Self-pay | Admitting: Internal Medicine

## 2014-02-07 ENCOUNTER — Encounter: Payer: Self-pay | Admitting: Internal Medicine

## 2014-02-07 ENCOUNTER — Ambulatory Visit (INDEPENDENT_AMBULATORY_CARE_PROVIDER_SITE_OTHER): Payer: Medicare Other | Admitting: *Deleted

## 2014-02-07 ENCOUNTER — Ambulatory Visit (INDEPENDENT_AMBULATORY_CARE_PROVIDER_SITE_OTHER): Payer: Medicare Other | Admitting: Internal Medicine

## 2014-02-07 VITALS — BP 94/71 | HR 72 | Ht 62.0 in | Wt 216.8 lb

## 2014-02-07 DIAGNOSIS — I4891 Unspecified atrial fibrillation: Secondary | ICD-10-CM

## 2014-02-07 DIAGNOSIS — I428 Other cardiomyopathies: Secondary | ICD-10-CM

## 2014-02-07 DIAGNOSIS — I495 Sick sinus syndrome: Secondary | ICD-10-CM

## 2014-02-07 DIAGNOSIS — Z950 Presence of cardiac pacemaker: Secondary | ICD-10-CM

## 2014-02-07 DIAGNOSIS — I519 Heart disease, unspecified: Secondary | ICD-10-CM

## 2014-02-07 DIAGNOSIS — I10 Essential (primary) hypertension: Secondary | ICD-10-CM

## 2014-02-07 LAB — POCT INR: INR: 3.6

## 2014-02-07 LAB — MDC_IDC_ENUM_SESS_TYPE_INCLINIC
Brady Statistic RA Percent Paced: 0 %
Date Time Interrogation Session: 20150223170959
Implantable Pulse Generator Model: 2210
Lead Channel Pacing Threshold Amplitude: 1 V
Lead Channel Pacing Threshold Amplitude: 1 V
Lead Channel Pacing Threshold Pulse Width: 0.8 ms
Lead Channel Pacing Threshold Pulse Width: 0.8 ms
Lead Channel Sensing Intrinsic Amplitude: 6.8 mV
MDC IDC MSMT BATTERY REMAINING LONGEVITY: 73.2 mo
MDC IDC MSMT BATTERY VOLTAGE: 2.95 V
MDC IDC MSMT LEADCHNL RA SENSING INTR AMPL: 1.9 mV
MDC IDC MSMT LEADCHNL RV IMPEDANCE VALUE: 475 Ohm
MDC IDC PG SERIAL: 7254513
MDC IDC SET LEADCHNL RV PACING AMPLITUDE: 2.5 V
MDC IDC SET LEADCHNL RV PACING PULSEWIDTH: 0.8 ms
MDC IDC SET LEADCHNL RV SENSING SENSITIVITY: 1.5 mV
MDC IDC STAT BRADY RV PERCENT PACED: 81 %

## 2014-02-07 NOTE — Patient Instructions (Signed)
Your physician recommends that you schedule a follow-up appointment in: 3 months with Norma Fredrickson, NP and 12 months with Dr Johney Frame   Remote monitoring is used to monitor your Pacemaker of ICD from home. This monitoring reduces the number of office visits required to check your device to one time per year. It allows [place] to keep an eye on the functioning of your device to ensure it is working properly. You are scheduled for a device check from home on 05/11/14. You may send your transmission at any time that day. If you have a wireless device, the transmission will be sent automatically. After your physician reviews your transmission, you will receive a postcard with your next transmission date.

## 2014-02-07 NOTE — Progress Notes (Signed)
PCP: Uvaldo RisingFLETKE, KYLE, J, MD  Misty GrayDolly J Gray is a 63 y.o. female who presents today for routine electrophysiology followup.  Since last being seen in our clinic, the patient reports doing well.  She was recently hospitalized with atypical chest pain in the setting of influenza.  Her pain resolved and has not returned. She denies symptoms with her afib. She remains active, without limitation presently. Today, she denies symptoms of palpitations, shortness of breath,  lower extremity edema, dizziness, presyncope, or syncope.  The patient is otherwise without complaint today.   Past Medical History  Diagnosis Date  . Abnormal CT of the head 12/16/1984    R parietal atrophy  . Nonischemic cardiomyopathy     EF has normalized-repeat Pending   . Hypertension   . GERD (gastroesophageal reflux disease)   . Unspecified venous (peripheral) insufficiency   . Diaphragmatic hernia without mention of obstruction or gangrene   . Female stress incontinence   . Allergic rhinitis, cause unspecified   . Morbid obesity   . Lichenification and lichen simplex chronicus   . Pacemaker  st judes   . Heart murmur   . CHF (congestive heart failure)   . H/O hiatal hernia     two removed  . Anemia   . High cholesterol   . Pneumonia 2004; 2011; 11/2011  . Sleep apnea, obstructive     CPAP  . Exertional dyspnea 06/22/12  . Migraine, unspecified, without mention of intractable migraine without mention of status migrainosus     "when I was a teenager"  . Seizures     "can hear you talking but sounds like you are in big tunnel; have them often if not taking RX; last one was 06/17/12" (06/22/12)  . Stroke 1988    "mouth drawed real bad on my left side"  . Sick sinus syndrome     WITH PRIOR DDD PACEMAKER IMPLANTATION  . Atrial fibrillation   . Syncope and collapse 06/22/12    "hit forehead and left knee"; denies loss of consciousness  . Arthritis     "hands and knees"  . Influenza A 01/11/2014   Past Surgical History   Procedure Laterality Date  . Leg skin lesion  biopsy / excision  05/16/2001    Lichen Planus  . Hernia repair  ? 1988; 04/15/1998    ventral  . Insert / replace / remove pacemaker  12/16/1990    DDD EF 30%;  Marland Kitchen. Insert / replace / remove pacemaker  07/25/2003    replaced by BB, leads are both IS1 and from 1992  . Insert / replace / remove pacemaker  05/21/2011    JA; generator change    Current Outpatient Prescriptions  Medication Sig Dispense Refill  . digoxin (LANOXIN) 0.125 MG tablet Take 1 tablet (125 mcg total) by mouth daily.  90 tablet  1  . enalapril (VASOTEC) 5 MG tablet Take 1 tablet (5 mg total) by mouth daily.  60 tablet  3  . fluticasone (FLONASE) 50 MCG/ACT nasal spray Place 2 sprays into the nose daily as needed. For congestion.  16 g  1  . furosemide (LASIX) 80 MG tablet Take 1 tablet in the morning and 1/2 tablet in the evening      . halobetasol (ULTRAVATE) 0.05 % cream Apply topically 2 (two) times daily.      Marland Kitchen. ibuprofen (ADVIL,MOTRIN) 200 MG tablet Take 200 mg by mouth every 6 (six) hours as needed for moderate pain.      .Marland Kitchen  metoprolol succinate (TOPROL-XL) 100 MG 24 hr tablet Take 100 mg by mouth daily. Take with or immediately following a meal.      . omeprazole (PRILOSEC) 20 MG capsule Take 1 capsule (20 mg total) by mouth daily.  30 capsule  5  . PHENobarbital (LUMINAL) 32.4 MG tablet Take 3 tablets by mouth at bedtime.  90 tablet  3  . spironolactone (ALDACTONE) 25 MG tablet Take 1 tablet (25 mg total) by mouth daily.  90 tablet  3  . warfarin (COUMADIN) 5 MG tablet Take as directed by the coumadin clinic       No current facility-administered medications for this visit.    Physical Exam: Filed Vitals:   02/07/14 1617  BP: 94/71  Pulse: 72  Height: 5\' 2"  (1.575 m)  Weight: 216 lb 12.8 oz (98.34 kg)    GEN- The patient is overweight and disheveled appearing, alert and oriented x 3 today.   Head- normocephalic, atraumatic Eyes-  Sclera clear, conjunctiva  pink Ears- hearing intact Oropharynx- clear Lungs- Clear to ausculation bilaterally, normal work of breathing Chest- pacemaker pocket is well healed Heart- irregular rate and rhythm, no murmurs, rubs or gallops, PMI not laterally displaced GI- soft, NT, ND, + BS Extremities- no clubbing, cyanosis, 1+ edema  Pacemaker interrogation- reviewed in detail today,  See PACEART report  Assessment and Plan:  1. Symptomatic bradycardia Normal pacemaker function See Pace Art report  2 afib She has asymptomatic permanent afib Continue coumadin for anticoagulation No changes today  3. Chronic systolic dysfunction Stable She is not interested in upgrade to CRT or any further cardiac workup at this time  4. Atypical chest pain Resolved She declines stress testing presently  Return in 3 months to see Lawson Fiscal I will see in a year

## 2014-02-21 ENCOUNTER — Ambulatory Visit (INDEPENDENT_AMBULATORY_CARE_PROVIDER_SITE_OTHER): Payer: Medicare Other | Admitting: *Deleted

## 2014-02-21 DIAGNOSIS — I4891 Unspecified atrial fibrillation: Secondary | ICD-10-CM

## 2014-02-21 LAB — POCT INR: INR: 3.2

## 2014-03-07 ENCOUNTER — Ambulatory Visit (INDEPENDENT_AMBULATORY_CARE_PROVIDER_SITE_OTHER): Payer: Medicare Other | Admitting: *Deleted

## 2014-03-07 DIAGNOSIS — I4891 Unspecified atrial fibrillation: Secondary | ICD-10-CM

## 2014-03-07 LAB — POCT INR: INR: 1.9

## 2014-03-21 ENCOUNTER — Ambulatory Visit: Payer: Medicare Other

## 2014-03-22 ENCOUNTER — Ambulatory Visit (INDEPENDENT_AMBULATORY_CARE_PROVIDER_SITE_OTHER): Payer: Medicare Other | Admitting: *Deleted

## 2014-03-22 DIAGNOSIS — I4891 Unspecified atrial fibrillation: Secondary | ICD-10-CM

## 2014-03-22 LAB — POCT INR: INR: 2.7

## 2014-03-25 ENCOUNTER — Ambulatory Visit: Payer: Medicare Other | Admitting: Family Medicine

## 2014-04-04 ENCOUNTER — Encounter: Payer: Self-pay | Admitting: Family Medicine

## 2014-04-04 ENCOUNTER — Ambulatory Visit (INDEPENDENT_AMBULATORY_CARE_PROVIDER_SITE_OTHER): Payer: Medicare Other | Admitting: Family Medicine

## 2014-04-04 VITALS — BP 89/59 | Temp 97.8°F | Ht 62.0 in | Wt 220.0 lb

## 2014-04-04 DIAGNOSIS — I8310 Varicose veins of unspecified lower extremity with inflammation: Secondary | ICD-10-CM

## 2014-04-04 DIAGNOSIS — I10 Essential (primary) hypertension: Secondary | ICD-10-CM

## 2014-04-04 DIAGNOSIS — I872 Venous insufficiency (chronic) (peripheral): Secondary | ICD-10-CM

## 2014-04-04 NOTE — Patient Instructions (Signed)
Redness in legs - related to stasis dermatitis (irritation from the swelling in your legs), I would recommend that you use compression stockings daily, may also use ACE wraps (wrap from the toes upward daily) and keep feet elevated as tolerated, continue the steroid cream  Return to office in one week, if symptoms worsen may need to add and antibiotic.

## 2014-04-05 NOTE — Assessment & Plan Note (Signed)
Patient is currently hypotensive. She denies lightheadedness or orthostasis. She is currently on low-dose ACE inhibitor, beta blocker, and Spironolactone. She is also on Lasix 80 mg daily. She has a history of hypotension. -As patient is currently asymptomatic no changes to pharmacotherapy will be made today however if she continues to be hypotensive will need to adjust blood pressure medications, consider decreasing enalapril to 2.5 mg or Toprol to 50 mg daily -Will recheck blood pressure at one week followup

## 2014-04-05 NOTE — Assessment & Plan Note (Signed)
Patient presents for evaluation of worsening stasis dermatitis of bilateral lower extremities. She has been noncompliant with compression therapy as prescribed. No evidence of acute infection. No evidence of acute CHF exacerbation or volume overload. Weight is stable. -Patient was counseled on the importance of compression, patient declined prescription for compression stocking, she will attempt a trial of wrapping bilateral legs with an Ace bandage, she was counseled to start at the toes and wrap proximally, she was also told to keep her legs elevated throughout the day and to continue her current dose of Lasix -Patient to return for reevaluation in one week

## 2014-04-05 NOTE — Progress Notes (Signed)
   Subjective:    Patient ID: Misty Gray, female    DOB: November 16, 1951, 63 y.o.   MRN: [id number]  HPI 63 year old female with past medical history of systolic heart failure, venous insufficiency, and chronic stasis dermatitis presents with increasing redness of her bilateral anterior lower extremities, patient denies associated fevers or chills, patient has been previously prescribed compression stockings however was not able to afford these, she does not apply compression on a regular basis or keep her feet elevated as counseled to, she currently denies increased shortness of breath compared to her baseline, she denies chest pain at this time   Review of Systems  Constitutional: Negative for fever, chills and fatigue.  Respiratory: Negative for chest tightness.   Cardiovascular: Positive for leg swelling. Negative for chest pain.       Objective:   Physical Exam Vitals: Reviewed, afebrile General: Pleasant Caucasian female, accompanied by her son, no acute distress Cardiac: Regular rate and rhythm, S1 and S2 present, no murmurs, no heaves or thrills Respiratory: Clear to auscultation bilaterally Extremities: Erythema present over the bilateral anterior shins, mild warmth, no drainage, no calf tenderness or palpable cords, 2+ edema to the bilateral knees      Assessment & Plan:  Please see problem specific assessment and plan.

## 2014-04-12 ENCOUNTER — Ambulatory Visit (INDEPENDENT_AMBULATORY_CARE_PROVIDER_SITE_OTHER): Payer: Medicare Other | Admitting: *Deleted

## 2014-04-12 DIAGNOSIS — I4891 Unspecified atrial fibrillation: Secondary | ICD-10-CM

## 2014-04-12 LAB — POCT INR: INR: 1.4

## 2014-04-15 ENCOUNTER — Ambulatory Visit (INDEPENDENT_AMBULATORY_CARE_PROVIDER_SITE_OTHER): Payer: Medicare Other | Admitting: *Deleted

## 2014-04-15 ENCOUNTER — Encounter: Payer: Self-pay | Admitting: Family Medicine

## 2014-04-15 ENCOUNTER — Ambulatory Visit (INDEPENDENT_AMBULATORY_CARE_PROVIDER_SITE_OTHER): Payer: Medicare Other | Admitting: Family Medicine

## 2014-04-15 VITALS — BP 120/76 | HR 73 | Temp 98.0°F | Ht 62.0 in | Wt 222.0 lb

## 2014-04-15 DIAGNOSIS — I5022 Chronic systolic (congestive) heart failure: Secondary | ICD-10-CM

## 2014-04-15 DIAGNOSIS — I4891 Unspecified atrial fibrillation: Secondary | ICD-10-CM

## 2014-04-15 DIAGNOSIS — I872 Venous insufficiency (chronic) (peripheral): Secondary | ICD-10-CM

## 2014-04-15 DIAGNOSIS — I8310 Varicose veins of unspecified lower extremity with inflammation: Secondary | ICD-10-CM

## 2014-04-15 DIAGNOSIS — I10 Essential (primary) hypertension: Secondary | ICD-10-CM

## 2014-04-15 LAB — POCT INR: INR: 1.7

## 2014-04-15 NOTE — Assessment & Plan Note (Signed)
Unchanged from previous visit. Patient is intolerant to ACE bandages and can not afford compression stockings. She is already on Lasix 80 mg in the AM and 40 mg in the PM. No evidence of acute infection -patient counseled to keep feet elevated and gradually increase time when wearing ACE bandages -no changes to Lasix today -May need Unna boot if continues to show no improvement -Counseled to take break from steroid ointment as this may thin the skin over long periods of time

## 2014-04-15 NOTE — Assessment & Plan Note (Signed)
Rate controlled. INR subtherapeutic today at 1.7.  -Increase Coumadin to 12.5 M/W/F and 10 mg other days

## 2014-04-15 NOTE — Assessment & Plan Note (Signed)
Patient is not hypotensive today. -continue current regimen

## 2014-04-15 NOTE — Progress Notes (Signed)
   Subjective:    Patient ID: Misty Gray, female    DOB: Dec 04, 1951, 63 y.o.   MRN: [id number]  HPI 63 y/o female presents for follow up of venous insufficiency/LE swelling.  Venous insufficiency/LE swelling/stasis dermatitis - unchanged from previous visit last week, patient reports that she was intolerant to ACE bandages, she can not afford compression stockings, does not keep her feet elevated during the day, has continued to use daily steroid cream to lower extremities, has had some fluid weeping from her lower extremities  Atrial Fibrillation/CHF - no chest pain, no shortness of breath, no weight gain, taking Lasix 80 mg in the AM and 40 mg in the PM per cardiology recommendations, INR check today (listed below), patient reports stable green vegetable intake over the past week  Hypertension - hypotensive at last visit, no lightheadedness, no syncope  Social - lives with her husband, former smoker   Review of Systems  Constitutional: Negative for fever, chills and fatigue.  Respiratory: Negative for cough and shortness of breath.   Cardiovascular: Positive for leg swelling. Negative for chest pain and palpitations.       Objective:   Physical Exam Vitals: reviewed Gen: pleasant female, NAD, accompanied by her son HEENT: normocephalic, PERRL, EOMI, MMM Cardiac: Irregular Irregular rate and rhythm, no murmurs, no heaves/thrills Resp: CTAB, normal effort, no cough Ext: stasis dermatitis present over bilateral tibia, mild weeping bilaterally, no ulcerations on feet, no calf pain, mild warmth bilaterally      Assessment & Plan:  Please see problem specific assessment and plan.

## 2014-04-15 NOTE — Assessment & Plan Note (Signed)
Stable without acute volume overload. Likely contributing to chronic leg swelling/stasis dermatitis. -continue current medication regimen.

## 2014-04-15 NOTE — Patient Instructions (Signed)
Leg Swelling - keep legs elevated as much as possible, wear ACE bandages during the day, continue current dose of lasix  Atrial Fibrillation - increase Coumadin to 12.5 mg (2.5 tablets) Monday/Wednesday/Friday, continue 10 mg (2 tablets) every other day.  Return in 2 weeks to follow up on leg swelling.

## 2014-04-22 ENCOUNTER — Ambulatory Visit: Payer: Medicare Other

## 2014-04-29 ENCOUNTER — Encounter: Payer: Self-pay | Admitting: Family Medicine

## 2014-04-29 ENCOUNTER — Ambulatory Visit (INDEPENDENT_AMBULATORY_CARE_PROVIDER_SITE_OTHER): Payer: Medicare Other | Admitting: *Deleted

## 2014-04-29 ENCOUNTER — Ambulatory Visit (INDEPENDENT_AMBULATORY_CARE_PROVIDER_SITE_OTHER): Payer: Medicare Other | Admitting: Family Medicine

## 2014-04-29 VITALS — BP 97/61 | HR 76 | Temp 97.8°F | Resp 20 | Wt 211.0 lb

## 2014-04-29 DIAGNOSIS — I872 Venous insufficiency (chronic) (peripheral): Secondary | ICD-10-CM

## 2014-04-29 DIAGNOSIS — I8310 Varicose veins of unspecified lower extremity with inflammation: Secondary | ICD-10-CM

## 2014-04-29 DIAGNOSIS — I10 Essential (primary) hypertension: Secondary | ICD-10-CM

## 2014-04-29 DIAGNOSIS — I4891 Unspecified atrial fibrillation: Secondary | ICD-10-CM

## 2014-04-29 LAB — POCT INR: INR: 2.1

## 2014-04-29 MED ORDER — METOPROLOL SUCCINATE ER 50 MG PO TB24: 50.0000 mg | ORAL_TABLET | Freq: Every day | ORAL | Status: DC

## 2014-04-29 NOTE — Assessment & Plan Note (Signed)
Patient remains hypotensive however is asymptomatic. -Will decrease Toprol to 50 mg

## 2014-04-29 NOTE — Assessment & Plan Note (Signed)
Patient remains asymptomatic. INR is therapeutic today. Continue current regimen

## 2014-04-29 NOTE — Patient Instructions (Signed)
Atrial Fibrillation - check INR today  Blood Pressure - low today, decrease Toprol to 50 mg daily  Leg swelling - much improved, continue to no use the steroid cream, avoid salt in diet, keep legs elevated as tolerated

## 2014-04-29 NOTE — Assessment & Plan Note (Signed)
Significant improvement since last visit. Patient counseled to continue current diuretics and encouraged to elevate feet as tolerated.

## 2014-04-29 NOTE — Progress Notes (Signed)
   Subjective:    Patient ID: Misty Gray, female    DOB: September 09, 1951, 63 y.o.   MRN: [id number]  HPI 63 year old Caucasian female presents for routine medical followup.  Atrial fibrillation-patient was subtherapeutic and her INR as of 04/15/2013, due for recheck of INR today, no current palpitations or chest pain, patient currently on Toprol 100 mg daily and digoxin  Hypertension-patient is asymptomatic, no chest pain, no lightheadedness, taking Toprol 100 mg daily and Aldactone 25 mg daily  Lower extremity swelling-patient states the swelling is significantly improved, she denies changes in her diet, she does not keep her feet elevated, she has not been wearing compression stockings, at last visit we did stop her Halobetasol, she states her legs are less erythematous in the swelling is decreased  Reviewed social history in Epic   Review of Systems  Constitutional: Negative for fever, chills and fatigue.  Respiratory: Negative for cough, choking and shortness of breath.   Cardiovascular: Negative for chest pain and leg swelling.       Objective:   Physical Exam Vitals: Reviewed General: Pleasant Caucasian female, obese, accompanied by her son Cardiac: Regular regular rhythm, S1 and S2 present, no murmurs, no heaves or thrills Respiratory: Clear to auscultation bilaterally, normal effort Extremities: Decreased edema bilateral lower extremities, patient still is 1+ edema to the midshin in bilateral extremities, decreased erythema compared to previous examination, multiple spider veins present, 1+ DP pulses bilaterally   POC INR 2.1    Assessment & Plan:  Please see problem specific assessment and plan.

## 2014-05-11 ENCOUNTER — Ambulatory Visit: Payer: Medicare Other

## 2014-05-11 ENCOUNTER — Ambulatory Visit (INDEPENDENT_AMBULATORY_CARE_PROVIDER_SITE_OTHER): Payer: Medicare Other | Admitting: *Deleted

## 2014-05-11 DIAGNOSIS — I495 Sick sinus syndrome: Secondary | ICD-10-CM

## 2014-05-11 DIAGNOSIS — I4891 Unspecified atrial fibrillation: Secondary | ICD-10-CM

## 2014-05-11 NOTE — Progress Notes (Signed)
Remote pacemaker transmission.   

## 2014-05-12 ENCOUNTER — Ambulatory Visit (INDEPENDENT_AMBULATORY_CARE_PROVIDER_SITE_OTHER): Payer: Medicare Other | Admitting: *Deleted

## 2014-05-12 DIAGNOSIS — I4891 Unspecified atrial fibrillation: Secondary | ICD-10-CM

## 2014-05-12 LAB — POCT INR: INR: 3

## 2014-05-18 ENCOUNTER — Other Ambulatory Visit: Payer: Self-pay | Admitting: Family Medicine

## 2014-05-18 NOTE — Telephone Encounter (Signed)
Patient called requesting refills for PHENobarbital

## 2014-05-19 ENCOUNTER — Other Ambulatory Visit: Payer: Self-pay | Admitting: Family Medicine

## 2014-05-20 MED ORDER — PHENOBARBITAL 32.4 MG PO TABS: ORAL_TABLET | ORAL | Status: DC

## 2014-05-20 NOTE — Telephone Encounter (Signed)
Called into pharmacy

## 2014-05-25 LAB — MDC_IDC_ENUM_SESS_TYPE_REMOTE
Battery Remaining Longevity: 76 mo
Battery Remaining Percentage: 74 %
Brady Statistic RV Percent Paced: 82 %
Date Time Interrogation Session: 20150527160213
Implantable Pulse Generator Model: 2210
Implantable Pulse Generator Serial Number: 7254513
Lead Channel Impedance Value: 480 Ohm
Lead Channel Pacing Threshold Amplitude: 1 V
Lead Channel Pacing Threshold Pulse Width: 0.8 ms
Lead Channel Setting Sensing Sensitivity: 1.5 mV
MDC IDC MSMT BATTERY VOLTAGE: 2.95 V
MDC IDC MSMT LEADCHNL RV SENSING INTR AMPL: 7.6 mV
MDC IDC SET LEADCHNL RV PACING AMPLITUDE: 2.5 V
MDC IDC SET LEADCHNL RV PACING PULSEWIDTH: 0.8 ms

## 2014-05-27 ENCOUNTER — Encounter: Payer: Self-pay | Admitting: Cardiology

## 2014-05-30 ENCOUNTER — Encounter: Payer: Self-pay | Admitting: Family Medicine

## 2014-05-30 ENCOUNTER — Ambulatory Visit (INDEPENDENT_AMBULATORY_CARE_PROVIDER_SITE_OTHER): Payer: Medicare Other | Admitting: Family Medicine

## 2014-05-30 ENCOUNTER — Ambulatory Visit (INDEPENDENT_AMBULATORY_CARE_PROVIDER_SITE_OTHER): Payer: Medicare Other | Admitting: *Deleted

## 2014-05-30 VITALS — BP 114/84 | HR 84 | Temp 97.8°F | Wt 227.0 lb

## 2014-05-30 DIAGNOSIS — I872 Venous insufficiency (chronic) (peripheral): Secondary | ICD-10-CM

## 2014-05-30 DIAGNOSIS — I8310 Varicose veins of unspecified lower extremity with inflammation: Secondary | ICD-10-CM

## 2014-05-30 DIAGNOSIS — I10 Essential (primary) hypertension: Secondary | ICD-10-CM

## 2014-05-30 DIAGNOSIS — I4891 Unspecified atrial fibrillation: Secondary | ICD-10-CM

## 2014-05-30 LAB — BASIC METABOLIC PANEL
BUN: 22 mg/dL (ref 6–23)
CO2: 26 mEq/L (ref 19–32)
Calcium: 9.7 mg/dL (ref 8.4–10.5)
Chloride: 104 mEq/L (ref 96–112)
Creat: 0.71 mg/dL (ref 0.50–1.10)
Glucose, Bld: 96 mg/dL (ref 70–99)
POTASSIUM: 4.3 meq/L (ref 3.5–5.3)
SODIUM: 138 meq/L (ref 135–145)

## 2014-05-30 LAB — POCT INR: INR: 2.9

## 2014-05-30 MED ORDER — ENALAPRIL MALEATE 5 MG PO TABS: 5.0000 mg | ORAL_TABLET | Freq: Every day | ORAL | Status: DC

## 2014-05-30 NOTE — Assessment & Plan Note (Signed)
Currently stable. INR therapeutic. -Continue current therapy

## 2014-05-30 NOTE — Assessment & Plan Note (Signed)
Patient is normotensive today. -Continue current therapy

## 2014-05-30 NOTE — Assessment & Plan Note (Signed)
Stable from previous examination.  -Continue daily Lasix and elevation as tolerated. -Check BMP to monitor kidney function

## 2014-05-30 NOTE — Progress Notes (Signed)
   Subjective:    Patient ID: Misty Gray, female    DOB: 19-Dec-1950, 63 y.o.   MRN: [id number]  HPI 63 y/o female presents for routine visit.  Leg swelling - has been controlled for the most part since last visit, slightly worse over the past few days however she does admit to missing a dose of lasix a few days ago and she was going to an event and did not want to have to use the bathroom frequently, she has not been elevating her legs, she has not been applying steroid cream, she has bought new shoes  Hypertension - she has had hypotension in the past, at last visit Lopressor was decreased to 50 mg daily, no lightheadedness, no vision changes, no headaches  Atrial Fibrillation - on chronic anticoagulation with Coumadin, no bleeding, no hearth palpitations, no chest pain  Social: lives alone, former smoker  Review of Systems  Constitutional: Negative for fever, chills and fatigue.  Respiratory: Negative for shortness of breath.   Cardiovascular: Positive for leg swelling. Negative for chest pain.  Gastrointestinal: Negative for nausea, vomiting and diarrhea.  Skin: Positive for rash.       Objective:   Physical Exam Vitals: Reviewed General: Pleasant Caucasian female, no acute distress, accompanied by her son Cardiac: Irregular irregular rate and rhythm, no murmurs, no heaves or thrills Respiratory: Clear to auscultation bilaterally, normal Extremities: 2+ edema of bilateral lower extremities to the knee Skin: Erythema and mild serous weeping of anterior shins  Point-of-care INR was 2.9 today     Assessment & Plan:  Please see problem specific assessment and plan.

## 2014-05-30 NOTE — Patient Instructions (Signed)
Your blood pressure is well controlled today, continue current medications. Check lab work today to look at your kidney functions and your potassium level.  Leg swelling - continue lasix daily, elevate your legs as tolerated, apply ACE bandages during the day as tolerated  Atrial Fibrillation - you INR is at a good level, continue coumadin as prescribed

## 2014-05-31 ENCOUNTER — Encounter: Payer: Self-pay | Admitting: Family Medicine

## 2014-06-01 ENCOUNTER — Encounter: Payer: Self-pay | Admitting: Internal Medicine

## 2014-06-15 ENCOUNTER — Other Ambulatory Visit: Payer: Self-pay | Admitting: Family Medicine

## 2014-06-20 ENCOUNTER — Ambulatory Visit: Payer: Medicare Other

## 2014-06-21 ENCOUNTER — Ambulatory Visit (INDEPENDENT_AMBULATORY_CARE_PROVIDER_SITE_OTHER): Payer: Medicare Other | Admitting: *Deleted

## 2014-06-21 DIAGNOSIS — I4891 Unspecified atrial fibrillation: Secondary | ICD-10-CM

## 2014-06-21 LAB — POCT INR: INR: 1.8

## 2014-07-07 ENCOUNTER — Ambulatory Visit (INDEPENDENT_AMBULATORY_CARE_PROVIDER_SITE_OTHER): Payer: Medicare Other | Admitting: *Deleted

## 2014-07-07 DIAGNOSIS — I4891 Unspecified atrial fibrillation: Secondary | ICD-10-CM

## 2014-07-07 LAB — POCT INR: INR: 3

## 2014-07-23 ENCOUNTER — Other Ambulatory Visit: Payer: Self-pay | Admitting: Family Medicine

## 2014-07-28 ENCOUNTER — Ambulatory Visit (INDEPENDENT_AMBULATORY_CARE_PROVIDER_SITE_OTHER): Payer: Medicare Other | Admitting: *Deleted

## 2014-07-28 DIAGNOSIS — I4891 Unspecified atrial fibrillation: Secondary | ICD-10-CM

## 2014-07-28 LAB — POCT INR: INR: 1.3

## 2014-08-11 ENCOUNTER — Ambulatory Visit (INDEPENDENT_AMBULATORY_CARE_PROVIDER_SITE_OTHER): Payer: Medicare Other | Admitting: *Deleted

## 2014-08-11 DIAGNOSIS — I4891 Unspecified atrial fibrillation: Secondary | ICD-10-CM

## 2014-08-11 LAB — POCT INR: INR: 2

## 2014-08-15 ENCOUNTER — Encounter: Payer: Self-pay | Admitting: Internal Medicine

## 2014-08-15 ENCOUNTER — Ambulatory Visit (INDEPENDENT_AMBULATORY_CARE_PROVIDER_SITE_OTHER): Payer: Medicare Other | Admitting: *Deleted

## 2014-08-15 DIAGNOSIS — I495 Sick sinus syndrome: Secondary | ICD-10-CM

## 2014-08-15 LAB — MDC_IDC_ENUM_SESS_TYPE_REMOTE
Battery Remaining Longevity: 77 mo
Battery Remaining Percentage: 74 %
Battery Voltage: 2.95 V
Date Time Interrogation Session: 20150831075445
Implantable Pulse Generator Model: 2210
Implantable Pulse Generator Serial Number: 7254513
Lead Channel Pacing Threshold Amplitude: 1 V
Lead Channel Pacing Threshold Pulse Width: 0.8 ms
Lead Channel Setting Pacing Amplitude: 2.5 V
Lead Channel Setting Sensing Sensitivity: 1.5 mV
MDC IDC MSMT LEADCHNL RV IMPEDANCE VALUE: 480 Ohm
MDC IDC MSMT LEADCHNL RV SENSING INTR AMPL: 9 mV
MDC IDC SET LEADCHNL RV PACING PULSEWIDTH: 0.8 ms
MDC IDC STAT BRADY RV PERCENT PACED: 77 %

## 2014-08-15 NOTE — Progress Notes (Signed)
Remote pacemaker transmission.   

## 2014-08-18 LAB — MDC_IDC_ENUM_SESS_TYPE_REMOTE
Battery Remaining Longevity: 77 mo
Battery Remaining Percentage: 74 %
Battery Voltage: 2.95 V
Brady Statistic RV Percent Paced: 77 %
Implantable Pulse Generator Serial Number: 7254513
Lead Channel Sensing Intrinsic Amplitude: 9 mV
Lead Channel Setting Pacing Amplitude: 2.5 V
Lead Channel Setting Pacing Pulse Width: 0.8 ms
MDC IDC MSMT LEADCHNL RV IMPEDANCE VALUE: 480 Ohm
MDC IDC MSMT LEADCHNL RV PACING THRESHOLD AMPLITUDE: 1 V
MDC IDC MSMT LEADCHNL RV PACING THRESHOLD PULSEWIDTH: 0.8 ms
MDC IDC SESS DTM: 20150831075445
MDC IDC SET LEADCHNL RV SENSING SENSITIVITY: 1.5 mV

## 2014-08-24 ENCOUNTER — Encounter: Payer: Self-pay | Admitting: Cardiology

## 2014-08-24 ENCOUNTER — Encounter: Payer: Self-pay | Admitting: *Deleted

## 2014-08-24 NOTE — Progress Notes (Signed)
Prior authorization received from pt's insurance for Phenobarbital.  PA form placed in provider box for review.  Clovis Pu, RN

## 2014-08-25 NOTE — Progress Notes (Signed)
PA faxed to [facility] for Review.  Clovis Pu, RN

## 2014-08-25 NOTE — Progress Notes (Signed)
Patient ID: Misty Gray, female   DOB: 09-14-1951, 63 y.o.   MRN: [id number] Paperwork completed and returned to [employer].

## 2014-08-29 ENCOUNTER — Encounter: Payer: Self-pay | Admitting: Family Medicine

## 2014-08-29 ENCOUNTER — Ambulatory Visit (INDEPENDENT_AMBULATORY_CARE_PROVIDER_SITE_OTHER): Payer: Medicare Other | Admitting: *Deleted

## 2014-08-29 ENCOUNTER — Ambulatory Visit (INDEPENDENT_AMBULATORY_CARE_PROVIDER_SITE_OTHER): Payer: Medicare Other | Admitting: Family Medicine

## 2014-08-29 VITALS — BP 120/76 | HR 69 | Temp 98.1°F | Ht 62.0 in | Wt 212.5 lb

## 2014-08-29 DIAGNOSIS — R569 Unspecified convulsions: Secondary | ICD-10-CM

## 2014-08-29 DIAGNOSIS — Z23 Encounter for immunization: Secondary | ICD-10-CM

## 2014-08-29 DIAGNOSIS — I4891 Unspecified atrial fibrillation: Secondary | ICD-10-CM

## 2014-08-29 DIAGNOSIS — I8310 Varicose veins of unspecified lower extremity with inflammation: Secondary | ICD-10-CM

## 2014-08-29 DIAGNOSIS — I10 Essential (primary) hypertension: Secondary | ICD-10-CM

## 2014-08-29 DIAGNOSIS — I480 Paroxysmal atrial fibrillation: Secondary | ICD-10-CM

## 2014-08-29 DIAGNOSIS — I872 Venous insufficiency (chronic) (peripheral): Secondary | ICD-10-CM

## 2014-08-29 LAB — CBC
HEMATOCRIT: 43.6 % (ref 36.0–46.0)
HEMOGLOBIN: 14.8 g/dL (ref 12.0–15.0)
MCH: 30.9 pg (ref 26.0–34.0)
MCHC: 33.9 g/dL (ref 30.0–36.0)
MCV: 91 fL (ref 78.0–100.0)
Platelets: 155 10*3/uL (ref 150–400)
RBC: 4.79 MIL/uL (ref 3.87–5.11)
RDW: 14.3 % (ref 11.5–15.5)
WBC: 5.5 10*3/uL (ref 4.0–10.5)

## 2014-08-29 LAB — POCT INR: INR: 2.2

## 2014-08-29 NOTE — Assessment & Plan Note (Signed)
Well-controlled on current regimen. ?

## 2014-08-29 NOTE — Patient Instructions (Signed)
Dr. Randolm Idol will call you with your lab results.  Schedule yearly physical with Dr. Randolm Idol in 1-2 months.

## 2014-08-29 NOTE — Progress Notes (Signed)
   Subjective:    Patient ID: Misty Gray, female    DOB: [birth year], 63 y.o.   MRN: [id number]  HPI 63 year old female presents for routine medical followup.  Atrial fibrillation-currently on warfarin, INR therapeutic today, denies chest pain or palpitations  GERD-controlled with omeprazole and avoidance of spicy foods  Hypertension-patient currently on enalapril, Toprol, and Spironolactone. No chest pain, no vision changes, no headaches.  History of seizures- no seizure activity since last visit, she continues to take phenobarbital nightly  Social-former smoker  Review of Systems  Constitutional: Negative for fever, chills and fatigue.  Respiratory: Negative for shortness of breath.   Cardiovascular: Positive for leg swelling. Negative for chest pain.  Gastrointestinal: Negative for nausea, vomiting and diarrhea.       Objective:   Physical Exam Vitals: Reviewed General: Pleasant Caucasian female, no acute distress Cardiac: Irregular irregular rate and rhythm, no murmurs, no heaves or thrills Respiratory: Clear to patient bilaterally, normal effort Extremities: 1+ edema in bilateral lower extremities, chronic anterior shin skin changes appear stable  Point-of-care INR 2.2     Assessment & Plan:  Please see problem specific assessment and plan.

## 2014-08-29 NOTE — Assessment & Plan Note (Signed)
No recent seizures. Stable on phenobarbital. -Check phenobarbital level, CBC, and liver enzymes

## 2014-08-29 NOTE — Progress Notes (Signed)
PA approved for Phenobarbital from [facility].  Approved through 08/26/15 under Medicare Part D.  Reference # [id number].  Clovis Pu, RN

## 2014-08-29 NOTE — Assessment & Plan Note (Signed)
Patient is rate controlled. INR is therapeutic -No change to therapy

## 2014-08-29 NOTE — Assessment & Plan Note (Signed)
Stable from previous exams -Continue Lasix and elevation

## 2014-08-30 ENCOUNTER — Encounter: Payer: Self-pay | Admitting: Family Medicine

## 2014-08-30 LAB — CMP AND LIVER
ALBUMIN: 3.9 g/dL (ref 3.5–5.2)
ALT: 18 U/L (ref 0–35)
AST: 24 U/L (ref 0–37)
Alkaline Phosphatase: 98 U/L (ref 39–117)
BILIRUBIN INDIRECT: 0.7 mg/dL (ref 0.2–1.2)
BUN: 17 mg/dL (ref 6–23)
Bilirubin, Direct: 0.3 mg/dL (ref 0.0–0.3)
CALCIUM: 9.6 mg/dL (ref 8.4–10.5)
CHLORIDE: 103 meq/L (ref 96–112)
CO2: 27 meq/L (ref 19–32)
Creat: 0.79 mg/dL (ref 0.50–1.10)
GLUCOSE: 93 mg/dL (ref 70–99)
POTASSIUM: 4.2 meq/L (ref 3.5–5.3)
Sodium: 141 mEq/L (ref 135–145)
Total Bilirubin: 1 mg/dL (ref 0.2–1.2)
Total Protein: 7.2 g/dL (ref 6.0–8.3)

## 2014-08-30 LAB — PHENOBARBITAL LEVEL: Phenobarbital: 9 mg/L — ABNORMAL LOW (ref 15.0–40.0)

## 2014-09-01 ENCOUNTER — Telehealth: Payer: Self-pay | Admitting: Family Medicine

## 2014-09-01 DIAGNOSIS — E2839 Other primary ovarian failure: Secondary | ICD-10-CM

## 2014-09-01 MED ORDER — PHENOBARBITAL 32.4 MG PO TABS: ORAL_TABLET | ORAL | Status: DC

## 2014-09-01 NOTE — Telephone Encounter (Addendum)
Discussed lab work with patient, phenobarbital level subtherapeutic, taking 34.2 mg three tablets at night, admits to missing occasional doses  Spoke to pharmacy team, we discussed possibly weaning phenobarbital and transitioning to keppra given multiple side effects of phenobarbital (sedation, bone loss, etc.)  Spoke to patient and we jointly decided to continue with phenobarbital, will increase dose to 2 tablets BID, will schedule DEXA scan to check BMD

## 2014-09-01 NOTE — Telephone Encounter (Signed)
Scheduled an appt for September 29th @ 9:00am at the [facility] imaging. Pt informed. [place], [place] CMA

## 2014-09-13 ENCOUNTER — Ambulatory Visit
Admission: RE | Admit: 2014-09-13 | Discharge: 2014-09-13 | Disposition: A | Discharge status: Home / Self Care | Payer: Medicare Other | Source: Ambulatory Visit | Attending: Family Medicine | Admitting: Family Medicine

## 2014-09-13 ENCOUNTER — Telehealth: Payer: Self-pay | Admitting: Family Medicine

## 2014-09-13 DIAGNOSIS — E2839 Other primary ovarian failure: Secondary | ICD-10-CM

## 2014-09-13 DIAGNOSIS — M8580 Other specified disorders of bone density and structure, unspecified site: Secondary | ICD-10-CM

## 2014-09-15 ENCOUNTER — Encounter: Payer: Self-pay | Admitting: Family Medicine

## 2014-09-15 DIAGNOSIS — M8580 Other specified disorders of bone density and structure, unspecified site: Secondary | ICD-10-CM | POA: Insufficient documentation

## 2014-09-15 MED ORDER — VITAMIN D3 25 MCG (1000 UT) PO CAPS: 1.0000 | ORAL_CAPSULE | Freq: Every morning | ORAL | Status: DC

## 2014-09-15 NOTE — Telephone Encounter (Signed)
Discussed diagnosis of osteopenia, patient states that she is supposed to be on vitamin D however has not been taking, sent in refill for Vitamin D 1000 IU daily  Patient also states that they were not able to refill her phenobarbital recently, per our records last refill sent in on 09/01/14 with one refill, spoke to pharmacy and they states that she does in fact have one refill, they will get this ready for her to pick up when she goes in to get the vitamin D

## 2014-09-21 ENCOUNTER — Ambulatory Visit (INDEPENDENT_AMBULATORY_CARE_PROVIDER_SITE_OTHER): Payer: Medicare Other | Admitting: *Deleted

## 2014-09-21 DIAGNOSIS — I4891 Unspecified atrial fibrillation: Secondary | ICD-10-CM

## 2014-09-21 LAB — POCT INR: INR: 1.8

## 2014-09-24 ENCOUNTER — Other Ambulatory Visit: Payer: Self-pay | Admitting: Family Medicine

## 2014-10-04 ENCOUNTER — Encounter: Payer: Self-pay | Admitting: Family Medicine

## 2014-10-04 ENCOUNTER — Ambulatory Visit (INDEPENDENT_AMBULATORY_CARE_PROVIDER_SITE_OTHER): Payer: Medicare Other | Admitting: *Deleted

## 2014-10-04 ENCOUNTER — Other Ambulatory Visit (HOSPITAL_COMMUNITY)
Admission: RE | Admit: 2014-10-04 | Discharge: 2014-10-04 | Disposition: A | Discharge status: Home / Self Care | Payer: Medicare Other | Source: Ambulatory Visit | Attending: Family Medicine | Admitting: Family Medicine

## 2014-10-04 ENCOUNTER — Ambulatory Visit (INDEPENDENT_AMBULATORY_CARE_PROVIDER_SITE_OTHER): Payer: Medicare Other | Admitting: Family Medicine

## 2014-10-04 ENCOUNTER — Other Ambulatory Visit: Payer: Self-pay

## 2014-10-04 VITALS — BP 95/64 | HR 90 | Temp 98.3°F | Ht 62.0 in | Wt 221.0 lb

## 2014-10-04 DIAGNOSIS — N63 Unspecified lump in unspecified breast: Secondary | ICD-10-CM

## 2014-10-04 DIAGNOSIS — Z0000 Encounter for general adult medical examination without abnormal findings: Secondary | ICD-10-CM

## 2014-10-04 DIAGNOSIS — N6320 Unspecified lump in the left breast, unspecified quadrant: Secondary | ICD-10-CM

## 2014-10-04 DIAGNOSIS — Z124 Encounter for screening for malignant neoplasm of cervix: Secondary | ICD-10-CM

## 2014-10-04 DIAGNOSIS — I10 Essential (primary) hypertension: Secondary | ICD-10-CM

## 2014-10-04 DIAGNOSIS — I4891 Unspecified atrial fibrillation: Secondary | ICD-10-CM

## 2014-10-04 DIAGNOSIS — N898 Other specified noninflammatory disorders of vagina: Secondary | ICD-10-CM

## 2014-10-04 LAB — POCT WET PREP (WET MOUNT): Clue Cells Wet Prep Whiff POC: NEGATIVE

## 2014-10-04 LAB — POCT INR: INR: 2.3

## 2014-10-04 MED ORDER — METOPROLOL SUCCINATE ER 25 MG PO TB24: 25.0000 mg | ORAL_TABLET | Freq: Every day | ORAL | Status: DC

## 2014-10-04 NOTE — Patient Instructions (Addendum)
Blood Pressure - low today, decrease dose of Toprol to 25 mg daily, new prescription sent to pharmacy  Information given to get Mammogram (nursing staff will call you to scheduled your mammogram) and on Living will   Dr. Ree Kida will call you with you pap smear results  Please schedule an appointment with Dr. Jenne Campus to discuss your nutrition.   Preventive Care for Adults A healthy lifestyle and preventive care can promote health and wellness. Preventive health guidelines for women include the following key practices.  A routine yearly physical is a good way to check with your health care provider about your health and preventive screening. It is a chance to share any concerns and updates on your health and to receive a thorough exam.  Visit your dentist for a routine exam and preventive care every 6 months. Brush your teeth twice a day and floss once a day. Good oral hygiene prevents tooth decay and gum disease.  The frequency of eye exams is based on your age, health, family medical history, use of contact lenses, and other factors. Follow your health care provider's recommendations for frequency of eye exams.  Eat a healthy diet. Foods like vegetables, fruits, whole grains, low-fat dairy products, and lean protein foods contain the nutrients you need without too many calories. Decrease your intake of foods high in solid fats, added sugars, and salt. Eat the right amount of calories for you.Get information about a proper diet from your health care provider, if necessary.  Regular physical exercise is one of the most important things you can do for your health. Most adults should get at least 150 minutes of moderate-intensity exercise (any activity that increases your heart rate and causes you to sweat) each week. In addition, most adults need muscle-strengthening exercises on 2 or more days a week.  Maintain a healthy weight. The body mass index (BMI) is a screening tool to identify possible  weight problems. It provides an estimate of body fat based on height and weight. Your health care provider can find your BMI and can help you achieve or maintain a healthy weight.For adults 20 years and older:  A BMI below 18.5 is considered underweight.  A BMI of 18.5 to 24.9 is normal.  A BMI of 25 to 29.9 is considered overweight.  A BMI of 30 and above is considered obese.  Maintain normal blood lipids and cholesterol levels by exercising and minimizing your intake of saturated fat. Eat a balanced diet with plenty of fruit and vegetables. Blood tests for lipids and cholesterol should begin at age 53 and be repeated every 5 years. If your lipid or cholesterol levels are high, you are over 50, or you are at high risk for heart disease, you may need your cholesterol levels checked more frequently.Ongoing high lipid and cholesterol levels should be treated with medicines if diet and exercise are not working.  If you smoke, find out from your health care provider how to quit. If you do not use tobacco, do not start.  Lung cancer screening is recommended for adults aged 57-80 years who are at high risk for developing lung cancer because of a history of smoking. A yearly low-dose CT scan of the lungs is recommended for people who have at least a 30-pack-year history of smoking and are a current smoker or have quit within the past 15 years. A pack year of smoking is smoking an average of 1 pack of cigarettes a day for 1 year (for example: 1  pack a day for 30 years or 2 packs a day for 15 years). Yearly screening should continue until the smoker has stopped smoking for at least 15 years. Yearly screening should be stopped for people who develop a health problem that would prevent them from having lung cancer treatment.  If you are pregnant, do not drink alcohol. If you are breastfeeding, be very cautious about drinking alcohol. If you are not pregnant and choose to drink alcohol, do not have more than 1  drink per day. One drink is considered to be 12 ounces (355 mL) of beer, 5 ounces (148 mL) of wine, or 1.5 ounces (44 mL) of liquor.  Avoid use of street drugs. Do not share needles with anyone. Ask for help if you need support or instructions about stopping the use of drugs.  High blood pressure causes heart disease and increases the risk of stroke. Your blood pressure should be checked at least every 1 to 2 years. Ongoing high blood pressure should be treated with medicines if weight loss and exercise do not work.  If you are 42-31 years old, ask your health care provider if you should take aspirin to prevent strokes.  Diabetes screening involves taking a blood sample to check your fasting blood sugar level. This should be done once every 3 years, after age 44, if you are within normal weight and without risk factors for diabetes. Testing should be considered at a younger age or be carried out more frequently if you are overweight and have at least 1 risk factor for diabetes.  Breast cancer screening is essential preventive care for women. You should practice "breast self-awareness." This means understanding the normal appearance and feel of your breasts and may include breast self-examination. Any changes detected, no matter how small, should be reported to a health care provider. Women in their 37s and 30s should have a clinical breast exam (CBE) by a health care provider as part of a regular health exam every 1 to 3 years. After age 70, women should have a CBE every year. Starting at age 81, women should consider having a mammogram (breast X-ray test) every year. Women who have a family history of breast cancer should talk to their health care provider about genetic screening. Women at a high risk of breast cancer should talk to their health care providers about having an MRI and a mammogram every year.  Breast cancer gene (BRCA)-related cancer risk assessment is recommended for women who have  family members with BRCA-related cancers. BRCA-related cancers include breast, ovarian, tubal, and peritoneal cancers. Having family members with these cancers may be associated with an increased risk for harmful changes (mutations) in the breast cancer genes BRCA1 and BRCA2. Results of the assessment will determine the need for genetic counseling and BRCA1 and BRCA2 testing.  Routine pelvic exams to screen for cancer are no longer recommended for nonpregnant women who are considered low risk for cancer of the pelvic organs (ovaries, uterus, and vagina) and who do not have symptoms. Ask your health care provider if a screening pelvic exam is right for you.  If you have had past treatment for cervical cancer or a condition that could lead to cancer, you need Pap tests and screening for cancer for at least 20 years after your treatment. If Pap tests have been discontinued, your risk factors (such as having a new sexual partner) need to be reassessed to determine if screening should be resumed. Some women have medical problems that  increase the chance of getting cervical cancer. In these cases, your health care provider may recommend more frequent screening and Pap tests.  The HPV test is an additional test that may be used for cervical cancer screening. The HPV test looks for the virus that can cause the cell changes on the cervix. The cells collected during the Pap test can be tested for HPV. The HPV test could be used to screen women aged 70 years and older, and should be used in women of any age who have unclear Pap test results. After the age of 92, women should have HPV testing at the same frequency as a Pap test.  Colorectal cancer can be detected and often prevented. Most routine colorectal cancer screening begins at the age of 20 years and continues through age 81 years. However, your health care provider may recommend screening at an earlier age if you have risk factors for colon cancer. On a yearly  basis, your health care provider may provide home test kits to check for hidden blood in the stool. Use of a small camera at the end of a tube, to directly examine the colon (sigmoidoscopy or colonoscopy), can detect the earliest forms of colorectal cancer. Talk to your health care provider about this at age 25, when routine screening begins. Direct exam of the colon should be repeated every 5-10 years through age 2 years, unless early forms of pre-cancerous polyps or small growths are found.  People who are at an increased risk for hepatitis B should be screened for this virus. You are considered at high risk for hepatitis B if:  You were born in a country where hepatitis B occurs often. Talk with your health care provider about which countries are considered high risk.  Your parents were born in a high-risk country and you have not received a shot to protect against hepatitis B (hepatitis B vaccine).  You have HIV or AIDS.  You use needles to inject street drugs.  You live with, or have sex with, someone who has hepatitis B.  You get hemodialysis treatment.  You take certain medicines for conditions like cancer, organ transplantation, and autoimmune conditions.  Hepatitis C blood testing is recommended for all people born from [birth year] through 1965 and any individual with known risks for hepatitis C.  Practice safe sex. Use condoms and avoid high-risk sexual practices to reduce the spread of sexually transmitted infections (STIs). STIs include gonorrhea, chlamydia, syphilis, trichomonas, herpes, HPV, and human immunodeficiency virus (HIV). Herpes, HIV, and HPV are viral illnesses that have no cure. They can result in disability, cancer, and death.  You should be screened for sexually transmitted illnesses (STIs) including gonorrhea and chlamydia if:  You are sexually active and are younger than 24 years.  You are older than 24 years and your health care provider tells you that you are at  risk for this type of infection.  Your sexual activity has changed since you were last screened and you are at an increased risk for chlamydia or gonorrhea. Ask your health care provider if you are at risk.  If you are at risk of being infected with HIV, it is recommended that you take a prescription medicine daily to prevent HIV infection. This is called preexposure prophylaxis (PrEP). You are considered at risk if:  You are a heterosexual woman, are sexually active, and are at increased risk for HIV infection.  You take drugs by injection.  You are sexually active with a partner who  has HIV.  Talk with your health care provider about whether you are at high risk of being infected with HIV. If you choose to begin PrEP, you should first be tested for HIV. You should then be tested every 3 months for as long as you are taking PrEP.  Osteoporosis is a disease in which the bones lose minerals and strength with aging. This can result in serious bone fractures or breaks. The risk of osteoporosis can be identified using a bone density scan. Women ages 57 years and over and women at risk for fractures or osteoporosis should discuss screening with their health care providers. Ask your health care provider whether you should take a calcium supplement or vitamin D to reduce the rate of osteoporosis.  Menopause can be associated with physical symptoms and risks. Hormone replacement therapy is available to decrease symptoms and risks. You should talk to your health care provider about whether hormone replacement therapy is right for you.  Use sunscreen. Apply sunscreen liberally and repeatedly throughout the day. You should seek shade when your shadow is shorter than you. Protect yourself by wearing long sleeves, pants, a wide-brimmed hat, and sunglasses year round, whenever you are outdoors.  Once a month, do a whole body skin exam, using a mirror to look at the skin on your back. Tell your health care  provider of new moles, moles that have irregular borders, moles that are larger than a pencil eraser, or moles that have changed in shape or color.  Stay current with required vaccines (immunizations).  Influenza vaccine. All adults should be immunized every year.  Tetanus, diphtheria, and acellular pertussis (Td, Tdap) vaccine. Pregnant women should receive 1 dose of Tdap vaccine during each pregnancy. The dose should be obtained regardless of the length of time since the last dose. Immunization is preferred during the 27th-36th week of gestation. An adult who has not previously received Tdap or who does not know her vaccine status should receive 1 dose of Tdap. This initial dose should be followed by tetanus and diphtheria toxoids (Td) booster doses every 10 years. Adults with an unknown or incomplete history of completing a 3-dose immunization series with Td-containing vaccines should begin or complete a primary immunization series including a Tdap dose. Adults should receive a Td booster every 10 years.  Varicella vaccine. An adult without evidence of immunity to varicella should receive 2 doses or a second dose if she has previously received 1 dose. Pregnant females who do not have evidence of immunity should receive the first dose after pregnancy. This first dose should be obtained before leaving the health care facility. The second dose should be obtained 4-8 weeks after the first dose.  Human papillomavirus (HPV) vaccine. Females aged 13-26 years who have not received the vaccine previously should obtain the 3-dose series. The vaccine is not recommended for use in pregnant females. However, pregnancy testing is not needed before receiving a dose. If a female is found to be pregnant after receiving a dose, no treatment is needed. In that case, the remaining doses should be delayed until after the pregnancy. Immunization is recommended for any person with an immunocompromised condition through the  age of 3 years if she did not get any or all doses earlier. During the 3-dose series, the second dose should be obtained 4-8 weeks after the first dose. The third dose should be obtained 24 weeks after the first dose and 16 weeks after the second dose.  Zoster vaccine. One dose is  recommended for adults aged 3 years or older unless certain conditions are present.  Measles, mumps, and rubella (MMR) vaccine. Adults born before [birth year] generally are considered immune to measles and mumps. Adults born in [birth year] or later should have 1 or more doses of MMR vaccine unless there is a contraindication to the vaccine or there is laboratory evidence of immunity to each of the three diseases. A routine second dose of MMR vaccine should be obtained at least 28 days after the first dose for students attending postsecondary schools, health care workers, or international travelers. People who received inactivated measles vaccine or an unknown type of measles vaccine during 1963-1967 should receive 2 doses of MMR vaccine. People who received inactivated mumps vaccine or an unknown type of mumps vaccine before 1979 and are at high risk for mumps infection should consider immunization with 2 doses of MMR vaccine. For females of childbearing age, rubella immunity should be determined. If there is no evidence of immunity, females who are not pregnant should be vaccinated. If there is no evidence of immunity, females who are pregnant should delay immunization until after pregnancy. Unvaccinated health care workers born before [birth year] who lack laboratory evidence of measles, mumps, or rubella immunity or laboratory confirmation of disease should consider measles and mumps immunization with 2 doses of MMR vaccine or rubella immunization with 1 dose of MMR vaccine.  Pneumococcal 13-valent conjugate (PCV13) vaccine. When indicated, a person who is uncertain of her immunization history and has no record of immunization should receive the  PCV13 vaccine. An adult aged 55 years or older who has certain medical conditions and has not been previously immunized should receive 1 dose of PCV13 vaccine. This PCV13 should be followed with a dose of pneumococcal polysaccharide (PPSV23) vaccine. The PPSV23 vaccine dose should be obtained at least 8 weeks after the dose of PCV13 vaccine. An adult aged 58 years or older who has certain medical conditions and previously received 1 or more doses of PPSV23 vaccine should receive 1 dose of PCV13. The PCV13 vaccine dose should be obtained 1 or more years after the last PPSV23 vaccine dose.  Pneumococcal polysaccharide (PPSV23) vaccine. When PCV13 is also indicated, PCV13 should be obtained first. All adults aged 78 years and older should be immunized. An adult younger than age 50 years who has certain medical conditions should be immunized. Any person who resides in a nursing home or long-term care facility should be immunized. An adult smoker should be immunized. People with an immunocompromised condition and certain other conditions should receive both PCV13 and PPSV23 vaccines. People with human immunodeficiency virus (HIV) infection should be immunized as soon as possible after diagnosis. Immunization during chemotherapy or radiation therapy should be avoided. Routine use of PPSV23 vaccine is not recommended for American Indians, [place] Natives, or people younger than 65 years unless there are medical conditions that require PPSV23 vaccine. When indicated, people who have unknown immunization and have no record of immunization should receive PPSV23 vaccine. One-time revaccination 5 years after the first dose of PPSV23 is recommended for people aged 19-64 years who have chronic kidney failure, nephrotic syndrome, asplenia, or immunocompromised conditions. People who received 1-2 doses of PPSV23 before age 15 years should receive another dose of PPSV23 vaccine at age 72 years or later if at least 5 years have  passed since the previous dose. Doses of PPSV23 are not needed for people immunized with PPSV23 at or after age 52 years.  Meningococcal vaccine. Adults with asplenia or  persistent complement component deficiencies should receive 2 doses of quadrivalent meningococcal conjugate (MenACWY-D) vaccine. The doses should be obtained at least 2 months apart. Microbiologists working with certain meningococcal bacteria, [employer] recruits, people at risk during an outbreak, and people who travel to or live in countries with a high rate of meningitis should be immunized. A first-year college student up through age 75 years who is living in a residence hall should receive a dose if she did not receive a dose on or after her 16th birthday. Adults who have certain high-risk conditions should receive one or more doses of vaccine.  Hepatitis A vaccine. Adults who wish to be protected from this disease, have certain high-risk conditions, work with hepatitis A-infected animals, work in hepatitis A research labs, or travel to or work in countries with a high rate of hepatitis A should be immunized. Adults who were previously unvaccinated and who anticipate close contact with an international adoptee during the first 60 days after arrival in the [place] from a country with a high rate of hepatitis A should be immunized.  Hepatitis B vaccine. Adults who wish to be protected from this disease, have certain high-risk conditions, may be exposed to blood or other infectious body fluids, are household contacts or sex partners of hepatitis B positive people, are clients or workers in certain care facilities, or travel to or work in countries with a high rate of hepatitis B should be immunized.  Haemophilus influenzae type b (Hib) vaccine. A previously unvaccinated person with asplenia or sickle cell disease or having a scheduled splenectomy should receive 1 dose of Hib vaccine. Regardless of previous immunization, a recipient of  a hematopoietic stem cell transplant should receive a 3-dose series 6-12 months after her successful transplant. Hib vaccine is not recommended for adults with HIV infection. Preventive Services / Frequency Ages 67 to 77 years  Blood pressure check.** / Every 1 to 2 years.  Lipid and cholesterol check.** / Every 5 years beginning at age 27.  Clinical breast exam.** / Every 3 years for women in their 35s and 35s.  BRCA-related cancer risk assessment.** / For women who have family members with a BRCA-related cancer (breast, ovarian, tubal, or peritoneal cancers).  Pap test.** / Every 2 years from ages 57 through 31. Every 3 years starting at age 59 through age 59 or 51 with a history of 3 consecutive normal Pap tests.  HPV screening.** / Every 3 years from ages 69 through ages 24 to 36 with a history of 3 consecutive normal Pap tests.  Hepatitis C blood test.** / For any individual with known risks for hepatitis C.  Skin self-exam. / Monthly.  Influenza vaccine. / Every year.  Tetanus, diphtheria, and acellular pertussis (Tdap, Td) vaccine.** / Consult your health care provider. Pregnant women should receive 1 dose of Tdap vaccine during each pregnancy. 1 dose of Td every 10 years.  Varicella vaccine.** / Consult your health care provider. Pregnant females who do not have evidence of immunity should receive the first dose after pregnancy.  HPV vaccine. / 3 doses over 6 months, if 26 and younger. The vaccine is not recommended for use in pregnant females. However, pregnancy testing is not needed before receiving a dose.  Measles, mumps, rubella (MMR) vaccine.** / You need at least 1 dose of MMR if you were born in 1957 or later. You may also need a 2nd dose. For females of childbearing age, rubella immunity should be determined. If there is no  evidence of immunity, females who are not pregnant should be vaccinated. If there is no evidence of immunity, females who are pregnant should delay  immunization until after pregnancy.  Pneumococcal 13-valent conjugate (PCV13) vaccine.** / Consult your health care provider.  Pneumococcal polysaccharide (PPSV23) vaccine.** / 1 to 2 doses if you smoke cigarettes or if you have certain conditions.  Meningococcal vaccine.** / 1 dose if you are age 37 to 43 years and a Market researcher living in a residence hall, or have one of several medical conditions, you need to get vaccinated against meningococcal disease. You may also need additional booster doses.  Hepatitis A vaccine.** / Consult your health care provider.  Hepatitis B vaccine.** / Consult your health care provider.  Haemophilus influenzae type b (Hib) vaccine.** / Consult your health care provider. Ages 81 to 14 years  Blood pressure check.** / Every 1 to 2 years.  Lipid and cholesterol check.** / Every 5 years beginning at age 61 years.  Lung cancer screening. / Every year if you are aged 49-80 years and have a 30-pack-year history of smoking and currently smoke or have quit within the past 15 years. Yearly screening is stopped once you have quit smoking for at least 15 years or develop a health problem that would prevent you from having lung cancer treatment.  Clinical breast exam.** / Every year after age 93 years.  BRCA-related cancer risk assessment.** / For women who have family members with a BRCA-related cancer (breast, ovarian, tubal, or peritoneal cancers).  Mammogram.** / Every year beginning at age 80 years and continuing for as long as you are in good health. Consult with your health care provider.  Pap test.** / Every 3 years starting at age 52 years through age 37 or 40 years with a history of 3 consecutive normal Pap tests.  HPV screening.** / Every 3 years from ages 78 years through ages 7 to 14 years with a history of 3 consecutive normal Pap tests.  Fecal occult blood test (FOBT) of stool. / Every year beginning at age 43 years and continuing  until age 35 years. You may not need to do this test if you get a colonoscopy every 10 years.  Flexible sigmoidoscopy or colonoscopy.** / Every 5 years for a flexible sigmoidoscopy or every 10 years for a colonoscopy beginning at age 34 years and continuing until age 25 years.  Hepatitis C blood test.** / For all people born from [birth year] through 1967 and any individual with known risks for hepatitis C.  Skin self-exam. / Monthly.  Influenza vaccine. / Every year.  Tetanus, diphtheria, and acellular pertussis (Tdap/Td) vaccine.** / Consult your health care provider. Pregnant women should receive 1 dose of Tdap vaccine during each pregnancy. 1 dose of Td every 10 years.  Varicella vaccine.** / Consult your health care provider. Pregnant females who do not have evidence of immunity should receive the first dose after pregnancy.  Zoster vaccine.** / 1 dose for adults aged 15 years or older.  Measles, mumps, rubella (MMR) vaccine.** / You need at least 1 dose of MMR if you were born in 1957 or later. You may also need a 2nd dose. For females of childbearing age, rubella immunity should be determined. If there is no evidence of immunity, females who are not pregnant should be vaccinated. If there is no evidence of immunity, females who are pregnant should delay immunization until after pregnancy.  Pneumococcal 13-valent conjugate (PCV13) vaccine.** / Consult your health care provider.  Pneumococcal polysaccharide (PPSV23) vaccine.** / 1 to 2 doses if you smoke cigarettes or if you have certain conditions.  Meningococcal vaccine.** / Consult your health care provider.  Hepatitis A vaccine.** / Consult your health care provider.  Hepatitis B vaccine.** / Consult your health care provider.  Haemophilus influenzae type b (Hib) vaccine.** / Consult your health care provider. Ages 68 years and over  Blood pressure check.** / Every 1 to 2 years.  Lipid and cholesterol check.** / Every 5 years  beginning at age 39 years.  Lung cancer screening. / Every year if you are aged 8-80 years and have a 30-pack-year history of smoking and currently smoke or have quit within the past 15 years. Yearly screening is stopped once you have quit smoking for at least 15 years or develop a health problem that would prevent you from having lung cancer treatment.  Clinical breast exam.** / Every year after age 64 years.  BRCA-related cancer risk assessment.** / For women who have family members with a BRCA-related cancer (breast, ovarian, tubal, or peritoneal cancers).  Mammogram.** / Every year beginning at age 32 years and continuing for as long as you are in good health. Consult with your health care provider.  Pap test.** / Every 3 years starting at age 69 years through age 6 or 55 years with 3 consecutive normal Pap tests. Testing can be stopped between 65 and 70 years with 3 consecutive normal Pap tests and no abnormal Pap or HPV tests in the past 10 years.  HPV screening.** / Every 3 years from ages 19 years through ages 16 or 52 years with a history of 3 consecutive normal Pap tests. Testing can be stopped between 65 and 70 years with 3 consecutive normal Pap tests and no abnormal Pap or HPV tests in the past 10 years.  Fecal occult blood test (FOBT) of stool. / Every year beginning at age 58 years and continuing until age 20 years. You may not need to do this test if you get a colonoscopy every 10 years.  Flexible sigmoidoscopy or colonoscopy.** / Every 5 years for a flexible sigmoidoscopy or every 10 years for a colonoscopy beginning at age 34 years and continuing until age 71 years.  Hepatitis C blood test.** / For all people born from [birth year] through 1967 and any individual with known risks for hepatitis C.  Osteoporosis screening.** / A one-time screening for women ages 23 years and over and women at risk for fractures or osteoporosis.  Skin self-exam. / Monthly.  Influenza vaccine. /  Every year.  Tetanus, diphtheria, and acellular pertussis (Tdap/Td) vaccine.** / 1 dose of Td every 10 years.  Varicella vaccine.** / Consult your health care provider.  Zoster vaccine.** / 1 dose for adults aged 16 years or older.  Pneumococcal 13-valent conjugate (PCV13) vaccine.** / Consult your health care provider.  Pneumococcal polysaccharide (PPSV23) vaccine.** / 1 dose for all adults aged 52 years and older.  Meningococcal vaccine.** / Consult your health care provider.  Hepatitis A vaccine.** / Consult your health care provider.  Hepatitis B vaccine.** / Consult your health care provider.  Haemophilus influenzae type b (Hib) vaccine.** / Consult your health care provider. ** Family history and personal history of risk and conditions may change your health care provider's recommendations. Document Released: 01/28/2002 Document Revised: 04/18/2014 Document Reviewed: 04/29/2011 [facility] Patient Information 2015 [place], [place]. This information is not intended to replace advice given to you by your health care provider. Make sure you discuss  any questions you have with your health care provider.

## 2014-10-05 ENCOUNTER — Encounter: Payer: Self-pay | Admitting: Family Medicine

## 2014-10-05 DIAGNOSIS — Z0000 Encounter for general adult medical examination without abnormal findings: Secondary | ICD-10-CM | POA: Insufficient documentation

## 2014-10-05 DIAGNOSIS — N898 Other specified noninflammatory disorders of vagina: Secondary | ICD-10-CM | POA: Insufficient documentation

## 2014-10-05 DIAGNOSIS — N6320 Unspecified lump in the left breast, unspecified quadrant: Secondary | ICD-10-CM | POA: Insufficient documentation

## 2014-10-05 NOTE — Assessment & Plan Note (Signed)
Patient's blood pressure remains low. She is asymptomatic. -decrease Toprol-XL to 25 mg daily

## 2014-10-05 NOTE — Assessment & Plan Note (Signed)
Two discrete breast masses identified at 12 oclock position. -diagnostic mammogram ordered

## 2014-10-05 NOTE — Progress Notes (Signed)
Patient ID: Misty Gray, female   DOB: [birth year], 63 y.o.   MRN: [id number] 63 y.o. year old female presents for well woman/preventative visit and annual GYN examination.  Acute Concerns: No acute issues identified  Diet: patient admits to poor diet, 0-1 fruits per day, 0-1 vegetables per day, 0-1 daily per day, diet mostly consists of meats and starches  Exercise: No regular exercise  Sexual/Birth History: 4 children  Birth Control: Postmenopausal  POA/Living Will: Would like her husband to be POA, does not have living will  Social:  History   Social History  . Marital Status: Married    Spouse Name: [name]    Number of Children: 4  . Years of Education: 12   Occupational History  . disabled    Social History Main Topics  . Smoking status: Former Smoker -- 1.00 packs/day    Types: Cigarettes    Quit date: 03/16/1969  . Smokeless tobacco: Never Used  . Alcohol Use: No  . Drug Use: No  . Sexual Activity: Not Currently    Birth Control/ Protection: Post-menopausal   Other Topics Concern  . None   Social History Narrative   Lives with husband [name] lives with them as do his 2 children   Daughter, [name], and her 3 children live in [place] -  [name] is incarcerated   Nephew, Quaniqua Chaffee lives with them   Daughter, [name], in [place] - [place]   Moved June 2013 to a better home on [address]    Immunization:  Tdap/TD: 2010  Influenza: 2015  Pneumococcal: 2000/2015  Herpes Zoster: Declined  Cancer Screening:  Pap Smear: unclear last pap smear, no history of cervical disease  Mammogram: 2013  Colonoscopy: 2006  Dexa: 2015 (osteopenia)  ROS: no chest pain, no syncope, no lightheadedness  Physical Exam: VITALS: reviewed GEN: pleasant female, NAD HEENT: normocephalic, PERRL, EOMI, no scleral icterus, nasal septum midline, MMM, uvula midline, neck supple, no thyromegaly, no cervical  adenopathy CARDIAC: Irregular Irregular rate and rhythm, rate controlled, no murmurs, no heaves/thrills RESP: CTAB, normal effort BREAST:Exam performed in the presence of a chaperone. 2 small masses at 12 oclock position of left breast, no nipple discharge, no axillary adenopathy ABD: obese, soft, no tenderness, normal bowel sounds GU/GYN:Exam performed in the presence of a chaperone. Normal external female anatomy, small amount of white discharge present, normal appearing cervix, wet prep and pap smear obtained, bimanual exam technically difficult due to obesity, no palpable masses EXT: 1+ edema, no LE drainage SKIN: chronic stasis changes of bilateral lower extremities, no suspicious skin lesions noted  ASSESSMENT & PLAN: 63 y.o. female presents for annual well woman/preventative exam and GYN exam. Please see problem specific assessment and plan.

## 2014-10-05 NOTE — Assessment & Plan Note (Signed)
Patient noted to have white vaginal discharge on exam -wet prep obtained, will call with results

## 2014-10-05 NOTE — Assessment & Plan Note (Signed)
63 y/o female presents for annual wellness visit and GYN exam -Up to date on immunizations, declined Zostavax -Up to date on routine labs -Pap smear obtained -Diagnostic Mammogram ordered (see specific problem) -Information on POA/Living will provided -Age appropriate anticipatory guidance given -Lifestyle modifications discussed

## 2014-10-06 LAB — CYTOLOGY - PAP

## 2014-10-07 ENCOUNTER — Other Ambulatory Visit: Payer: Self-pay | Admitting: Family Medicine

## 2014-10-07 ENCOUNTER — Telehealth: Payer: Self-pay | Admitting: Family Medicine

## 2014-10-07 NOTE — Telephone Encounter (Signed)
Discussed pap smear results with patient.

## 2014-10-13 ENCOUNTER — Ambulatory Visit
Admission: RE | Admit: 2014-10-13 | Discharge: 2014-10-13 | Disposition: A | Discharge status: Home / Self Care | Payer: Medicare Other | Source: Ambulatory Visit | Attending: Family Medicine | Admitting: Family Medicine

## 2014-10-13 DIAGNOSIS — N63 Unspecified lump in unspecified breast: Secondary | ICD-10-CM

## 2014-10-14 ENCOUNTER — Other Ambulatory Visit: Payer: Self-pay | Admitting: Family Medicine

## 2014-10-14 MED ORDER — WARFARIN SODIUM 5 MG PO TABS: ORAL_TABLET | ORAL | Status: DC

## 2014-10-25 ENCOUNTER — Ambulatory Visit (INDEPENDENT_AMBULATORY_CARE_PROVIDER_SITE_OTHER): Payer: Medicare Other | Admitting: *Deleted

## 2014-10-25 DIAGNOSIS — I4891 Unspecified atrial fibrillation: Secondary | ICD-10-CM

## 2014-10-25 DIAGNOSIS — Z7901 Long term (current) use of anticoagulants: Secondary | ICD-10-CM

## 2014-10-25 LAB — POCT INR: INR: 1.4

## 2014-10-26 ENCOUNTER — Other Ambulatory Visit: Payer: Self-pay | Admitting: Family Medicine

## 2014-11-02 ENCOUNTER — Encounter: Payer: Self-pay | Admitting: Family Medicine

## 2014-11-03 ENCOUNTER — Other Ambulatory Visit: Payer: Self-pay | Admitting: Family Medicine

## 2014-11-07 ENCOUNTER — Ambulatory Visit (INDEPENDENT_AMBULATORY_CARE_PROVIDER_SITE_OTHER): Payer: Medicare Other | Admitting: *Deleted

## 2014-11-07 ENCOUNTER — Encounter: Payer: Self-pay | Admitting: Family Medicine

## 2014-11-07 DIAGNOSIS — Z7901 Long term (current) use of anticoagulants: Secondary | ICD-10-CM

## 2014-11-07 DIAGNOSIS — I4891 Unspecified atrial fibrillation: Secondary | ICD-10-CM

## 2014-11-07 LAB — POCT INR: INR: 1.4

## 2014-11-07 NOTE — Progress Notes (Signed)
Patient dropped off [employer] form to be filled out.  Please mail to [facility] [employer](or fax) when completed.

## 2014-11-08 NOTE — Progress Notes (Signed)
Placed in MDs box to be filled out. Blount, Deseree CMA 

## 2014-11-14 NOTE — Progress Notes (Signed)
Form for [employer] completed. Faxed to [employer]. Also placed copy of form in scan box.

## 2014-11-16 ENCOUNTER — Ambulatory Visit (INDEPENDENT_AMBULATORY_CARE_PROVIDER_SITE_OTHER): Payer: Medicare Other | Admitting: *Deleted

## 2014-11-16 DIAGNOSIS — I495 Sick sinus syndrome: Secondary | ICD-10-CM

## 2014-11-16 LAB — MDC_IDC_ENUM_SESS_TYPE_REMOTE
Battery Remaining Longevity: 77 mo
Battery Voltage: 2.95 V
Brady Statistic RV Percent Paced: 77 %
Implantable Pulse Generator Model: 2210
Implantable Pulse Generator Serial Number: 7254513
Lead Channel Impedance Value: 480 Ohm
Lead Channel Sensing Intrinsic Amplitude: 7.4 mV
Lead Channel Setting Pacing Amplitude: 2.5 V
Lead Channel Setting Pacing Pulse Width: 0.8 ms
Lead Channel Setting Sensing Sensitivity: 1.5 mV
MDC IDC MSMT BATTERY REMAINING PERCENTAGE: 74 %
MDC IDC MSMT LEADCHNL RV PACING THRESHOLD AMPLITUDE: 1 V
MDC IDC MSMT LEADCHNL RV PACING THRESHOLD PULSEWIDTH: 0.8 ms
MDC IDC SESS DTM: 20151202182446

## 2014-11-16 NOTE — Progress Notes (Signed)
Remote pacemaker transmission.   

## 2014-11-21 ENCOUNTER — Ambulatory Visit (INDEPENDENT_AMBULATORY_CARE_PROVIDER_SITE_OTHER): Payer: Medicare Other | Admitting: *Deleted

## 2014-11-21 DIAGNOSIS — Z7901 Long term (current) use of anticoagulants: Secondary | ICD-10-CM

## 2014-11-21 DIAGNOSIS — I4891 Unspecified atrial fibrillation: Secondary | ICD-10-CM

## 2014-11-21 LAB — POCT INR: INR: 1.4

## 2014-11-22 ENCOUNTER — Encounter: Payer: Self-pay | Admitting: Cardiology

## 2014-11-22 ENCOUNTER — Ambulatory Visit: Payer: Medicare Other | Admitting: Family Medicine

## 2014-11-24 ENCOUNTER — Encounter: Payer: Self-pay | Admitting: Family Medicine

## 2014-11-24 ENCOUNTER — Ambulatory Visit (INDEPENDENT_AMBULATORY_CARE_PROVIDER_SITE_OTHER): Payer: Medicare Other | Admitting: Family Medicine

## 2014-11-24 NOTE — Patient Instructions (Signed)
   1. Walk to the church 5 days a week 2. Every time there is a commercial stand up and walk in place 3. Eat 2 meals a day  More breakfast options: yogurt, peanut butter sandwich  Eggs and bacon are good too but we want to make sure that time in preparing food doesn't stop you from eating  Don't let your handicap stop you, we are trying to help you stay out of the nursing home.   Follow up on Jan 7th, please make a 1 hour appointment.

## 2014-11-24 NOTE — Progress Notes (Signed)
Patient ID: Misty Gray, female   DOB: May 26, 1951, 63 y.o.   MRN: [id number] Nutrition Clinic Visit  Pt seen in nutrition clinic with Dr. Gerilyn Pilgrim. Pt presents to discuss nutrition in light of her Obesity and HTN.  Goal for coming to nutrition clinic: Weight loss  Eating pattern: 1 meals per day, 1 snacks per day  Foods/beverages frequently in diet:  - coffee (black) approx 1 pot per day, spaghetti, chicken alfredo, pizza, chicken (prepared fresh or pre-cooked), tea with splenda 1-2 glasses per day, diet caff free coke   Physical activity: walks to church 1 block twice a week, otherwise sits in recliner After dinner she sits in the recliner and reads a book or wathces tv until around 3 am, she often falls asleep intermittently in the chair.   24-hr recall: (Up at 12 pm) B (2pm)-   Coffee, egg sandwich- poached egg with 2 pieces of bread and 2 slices of [place] bacon Snk ( 330 pm)-  Coffee L (none PM)-   none Snk (none)-   none D (7 PM)-   Spaghetti 3-4 cups,  1 piece of garlic bread( [place] toast), coffee X 1 cup Snk (none)-   none Typical day? Yes.    Filed Weights   11/24/14 1105  Weight: 216 lb 8 oz (98.204 kg)    Recommendations:   OBESITY, MORBID Misty Gray is a 63 y/o female with obesity and HTN. She comes to nutrition clinic to establish care here and to lose weight with the hopes of better HTN and CHF control.   She lives a very sedentary lifestyle and eats an average of 1 meal per day with erratic snacks with an addition breakfast inconsistently. Our visit today focused mainly on determining what limits and motivates her. It sounds like she spends 90% of her waking time in the recliner. She does not want to end up in a nursing home. She has gone from walking 3-5 blocks a day in 2014 to now walking 1 block to church 3 times per week. Our major goals are below:  1. Walk to the church 5 days a week 2. Every time there is a commercial stand up and walk in place 3. Eat 2 meals  a day   Murtis Sink, MD Family Medicine Resident PGY-3   30 minutes of direct face-to-face time was spent with the patient with at least 50% of this time spent counseling the patient.

## 2014-11-24 NOTE — Assessment & Plan Note (Addendum)
Misty Gray is a 63 y/o female with obesity and HTN. She comes to nutrition clinic to establish care here and to lose weight with the hopes of better HTN and CHF control.   She lives a very sedentary lifestyle and eats an average of 1 meal per day with erratic snacks with an addition breakfast inconsistently. Our visit today focused mainly on determining what limits and motivates her. It sounds like she spends 90% of her waking time in the recliner. She does not want to end up in a nursing home. She has gone from walking 3-5 blocks a day in 2014 to now walking 1 block to church 3 times per week. Our major goals are below:  1. Walk to the church 5 days a week 2. Every time there is a commercial stand up and walk in place 3. Eat 2 meals a day

## 2014-11-29 ENCOUNTER — Encounter: Payer: Self-pay | Admitting: Internal Medicine

## 2014-12-05 ENCOUNTER — Ambulatory Visit: Payer: Medicare Other

## 2014-12-05 ENCOUNTER — Ambulatory Visit (INDEPENDENT_AMBULATORY_CARE_PROVIDER_SITE_OTHER): Payer: Medicare Other | Admitting: *Deleted

## 2014-12-05 DIAGNOSIS — Z7901 Long term (current) use of anticoagulants: Secondary | ICD-10-CM

## 2014-12-05 DIAGNOSIS — I4891 Unspecified atrial fibrillation: Secondary | ICD-10-CM

## 2014-12-05 LAB — POCT INR: INR: 1.7

## 2014-12-15 ENCOUNTER — Other Ambulatory Visit: Payer: Self-pay | Admitting: Family Medicine

## 2014-12-19 ENCOUNTER — Ambulatory Visit (INDEPENDENT_AMBULATORY_CARE_PROVIDER_SITE_OTHER): Payer: PPO | Admitting: *Deleted

## 2014-12-19 DIAGNOSIS — Z7901 Long term (current) use of anticoagulants: Secondary | ICD-10-CM

## 2014-12-19 DIAGNOSIS — I4891 Unspecified atrial fibrillation: Secondary | ICD-10-CM

## 2014-12-19 LAB — POCT INR: INR: 3.5

## 2014-12-22 ENCOUNTER — Ambulatory Visit: Payer: Medicare Other | Admitting: Family Medicine

## 2014-12-28 ENCOUNTER — Ambulatory Visit (INDEPENDENT_AMBULATORY_CARE_PROVIDER_SITE_OTHER): Payer: PPO | Admitting: *Deleted

## 2014-12-28 DIAGNOSIS — I4891 Unspecified atrial fibrillation: Secondary | ICD-10-CM

## 2014-12-28 DIAGNOSIS — Z7901 Long term (current) use of anticoagulants: Secondary | ICD-10-CM

## 2014-12-28 LAB — POCT INR: INR: 2.9

## 2015-01-11 ENCOUNTER — Ambulatory Visit (INDEPENDENT_AMBULATORY_CARE_PROVIDER_SITE_OTHER): Payer: PPO | Admitting: *Deleted

## 2015-01-11 DIAGNOSIS — Z7901 Long term (current) use of anticoagulants: Secondary | ICD-10-CM

## 2015-01-11 DIAGNOSIS — I4891 Unspecified atrial fibrillation: Secondary | ICD-10-CM

## 2015-01-11 LAB — POCT INR: INR: 3

## 2015-01-12 ENCOUNTER — Other Ambulatory Visit: Payer: Self-pay | Admitting: Family Medicine

## 2015-01-20 ENCOUNTER — Other Ambulatory Visit: Payer: Self-pay | Admitting: Family Medicine

## 2015-01-26 ENCOUNTER — Ambulatory Visit (INDEPENDENT_AMBULATORY_CARE_PROVIDER_SITE_OTHER): Payer: PPO | Admitting: *Deleted

## 2015-01-26 DIAGNOSIS — Z7901 Long term (current) use of anticoagulants: Secondary | ICD-10-CM

## 2015-01-26 DIAGNOSIS — I4891 Unspecified atrial fibrillation: Secondary | ICD-10-CM

## 2015-01-26 LAB — POCT INR: INR: 3.8

## 2015-02-08 ENCOUNTER — Other Ambulatory Visit: Payer: Self-pay | Admitting: Family Medicine

## 2015-02-09 ENCOUNTER — Ambulatory Visit (INDEPENDENT_AMBULATORY_CARE_PROVIDER_SITE_OTHER): Payer: PPO | Admitting: Internal Medicine

## 2015-02-09 ENCOUNTER — Encounter: Payer: Self-pay | Admitting: Internal Medicine

## 2015-02-09 ENCOUNTER — Ambulatory Visit (INDEPENDENT_AMBULATORY_CARE_PROVIDER_SITE_OTHER): Payer: PPO | Admitting: *Deleted

## 2015-02-09 VITALS — BP 100/70 | HR 61 | Ht 62.0 in | Wt 211.6 lb

## 2015-02-09 DIAGNOSIS — I4891 Unspecified atrial fibrillation: Secondary | ICD-10-CM

## 2015-02-09 DIAGNOSIS — R001 Bradycardia, unspecified: Secondary | ICD-10-CM

## 2015-02-09 DIAGNOSIS — Z7901 Long term (current) use of anticoagulants: Secondary | ICD-10-CM

## 2015-02-09 DIAGNOSIS — I480 Paroxysmal atrial fibrillation: Secondary | ICD-10-CM

## 2015-02-09 DIAGNOSIS — I495 Sick sinus syndrome: Secondary | ICD-10-CM

## 2015-02-09 DIAGNOSIS — I519 Heart disease, unspecified: Secondary | ICD-10-CM

## 2015-02-09 DIAGNOSIS — Z950 Presence of cardiac pacemaker: Secondary | ICD-10-CM

## 2015-02-09 LAB — MDC_IDC_ENUM_SESS_TYPE_INCLINIC
Battery Remaining Longevity: 93.6 mo
Battery Voltage: 2.95 V
Brady Statistic RV Percent Paced: 77 %
Date Time Interrogation Session: 20160225132051
Implantable Pulse Generator Model: 2210
Implantable Pulse Generator Serial Number: 7254513
Lead Channel Sensing Intrinsic Amplitude: 1.9 mV
Lead Channel Setting Pacing Amplitude: 2.5 V
Lead Channel Setting Pacing Pulse Width: 0.8 ms
MDC IDC MSMT LEADCHNL RV IMPEDANCE VALUE: 475 Ohm
MDC IDC MSMT LEADCHNL RV PACING THRESHOLD AMPLITUDE: 1 V
MDC IDC MSMT LEADCHNL RV PACING THRESHOLD PULSEWIDTH: 0.8 ms
MDC IDC MSMT LEADCHNL RV SENSING INTR AMPL: 7.9 mV
MDC IDC SET LEADCHNL RV SENSING SENSITIVITY: 1.5 mV
MDC IDC STAT BRADY RA PERCENT PACED: 0 %

## 2015-02-09 LAB — POCT INR: INR: 2.3

## 2015-02-09 NOTE — Patient Instructions (Signed)
Your physician wants you to follow-up in: 6 months with Norma Fredrickson, NP and 12 months with Dr Jacquiline Doe will receive a reminder letter in the mail two months in advance. If you don't receive a letter, please call our office to schedule the follow-up appointment.   Remote monitoring is used to monitor your Pacemaker of ICD from home. This monitoring reduces the number of office visits required to check your device to one time per year. It allows [place] to keep an eye on the functioning of your device to ensure it is working properly. You are scheduled for a device check from home on 05/11/15. You may send your transmission at any time that day. If you have a wireless device, the transmission will be sent automatically. After your physician reviews your transmission, you will receive a postcard with your next transmission date.

## 2015-02-09 NOTE — Progress Notes (Signed)
PCP: Uvaldo Rising, MD  Misty Gray is a 64 y.o. female who presents today for routine electrophysiology followup.  Since last being seen in our clinic, the patient reports doing well.  She denies symptoms with her afib.  She has SOB with moderate activity which is stable.  She remains active, without limitation presently. Today, she denies symptoms of palpitations, lower extremity edema, dizziness, presyncope, or syncope.  The patient is otherwise without complaint today.   Past Medical History  Diagnosis Date  . Abnormal CT of the head 12/16/1984    R parietal atrophy  . Nonischemic cardiomyopathy     EF has normalized-repeat Pending   . Hypertension   . GERD (gastroesophageal reflux disease)   . Unspecified venous (peripheral) insufficiency   . Diaphragmatic hernia without mention of obstruction or gangrene   . Female stress incontinence   . Allergic rhinitis, cause unspecified   . Morbid obesity   . Lichenification and lichen simplex chronicus   . Pacemaker  st judes   . Heart murmur   . CHF (congestive heart failure)   . H/O hiatal hernia     two removed  . Anemia   . High cholesterol   . Pneumonia 2004; 2011; 11/2011  . Sleep apnea, obstructive     CPAP  . Exertional dyspnea 06/22/12  . Migraine, unspecified, without mention of intractable migraine without mention of status migrainosus     "when I was a teenager"  . Seizures     "can hear you talking but sounds like you are in big tunnel; have them often if not taking RX; last one was 06/17/12" (06/22/12)  . Stroke 1988    "mouth drawed real bad on my left side"  . Sick sinus syndrome     WITH PRIOR DDD PACEMAKER IMPLANTATION  . Atrial fibrillation   . Syncope and collapse 06/22/12    "hit forehead and left knee"; denies loss of consciousness  . Arthritis     "hands and knees"  . Influenza A 01/11/2014   Past Surgical History  Procedure Laterality Date  . Leg skin lesion  biopsy / excision  05/16/2001    Lichen Planus  .  Hernia repair  ? 1988; 04/15/1998    ventral  . Insert / replace / remove pacemaker  12/16/1990    DDD EF 30%;  Marland Kitchen Insert / replace / remove pacemaker  07/25/2003    replaced by BB, leads are both IS1 and from 1992  . Insert / replace / remove pacemaker  05/21/2011    JA; generator change    Current Outpatient Prescriptions  Medication Sig Dispense Refill  . Cholecalciferol (VITAMIN D3) 1000 UNITS CAPS Take 1 capsule (1,000 Units total) by mouth every morning. 90 capsule 1  . digoxin (LANOXIN) 0.125 MG tablet TAKE 1 TABLET (125 MCG TOTAL) BY MOUTH DAILY. 90 tablet 1  . enalapril (VASOTEC) 5 MG tablet TAKE 1 TABLET BY MOUTH DAILY 90 tablet 1  . fluticasone (FLONASE) 50 MCG/ACT nasal spray Place 2 sprays into the nose daily as needed. For congestion. 16 g 1  . furosemide (LASIX) 80 MG tablet TAKE ONE TABLET IN THE AM AND 1/2 TABLET IN THE PM 60 tablet 2  . halobetasol (ULTRAVATE) 0.05 % cream Apply 1 application topically 2 (two) times daily. Use on legs    . metoprolol succinate (TOPROL-XL) 25 MG 24 hr tablet TAKE 1 TABLET (25 MG TOTAL) BY MOUTH DAILY. 90 tablet 1  . omeprazole (PRILOSEC)  20 MG capsule TAKE 1 CAPSULE (20 MG TOTAL) BY MOUTH DAILY. 90 capsule 1  . PHENobarbital (LUMINAL) 32.4 MG tablet Take 2 tablets (64.8 mg total) by mouth 2 (two) times daily. 120 tablet 5  . spironolactone (ALDACTONE) 25 MG tablet TAKE 1 TABLET (25 MG TOTAL) BY MOUTH DAILY. 90 tablet 1  . warfarin (COUMADIN) 5 MG tablet Take daily as directed. 75 tablet 2   No current facility-administered medications for this visit.    Physical Exam: Filed Vitals:   02/09/15 1206  BP: 100/70  Pulse: 61  Height:  (1.575 m)  Weight: 211 lb 9.6 oz (95.981 kg)    GEN- The patient is overweight and disheveled appearing, alert and oriented x 3 today.   Head- normocephalic, atraumatic Eyes-  Sclera clear, conjunctiva pink Ears- hearing intact Oropharynx- clear Lungs- Clear to ausculation bilaterally, normal work of  breathing Chest- pacemaker pocket is well healed Heart- irregular rate and rhythm, no murmurs, rubs or gallops, PMI not laterally displaced GI- soft, NT, ND, + BS Extremities- no clubbing, cyanosis, 1+ edema  Pacemaker interrogation- reviewed in detail today,  See PACEART report  Assessment and Plan:  1. Symptomatic bradycardia Normal pacemaker function See Pace Art report  2 afib She has asymptomatic permanent afib Continue coumadin for anticoagulation No changes today  3. Chronic systolic dysfunction Stable She is not interested in upgrade to CRT or any further cardiac workup at this time  Return in 6 months to see Lawson Fiscal I will see in a year merlin

## 2015-02-13 ENCOUNTER — Encounter: Payer: Self-pay | Admitting: Internal Medicine

## 2015-02-20 ENCOUNTER — Telehealth: Payer: Self-pay | Admitting: Home Health Services

## 2015-02-20 NOTE — Telephone Encounter (Signed)
Left message to call back.  Needs to schedule AWV with Rosalita Chessman.

## 2015-02-21 ENCOUNTER — Other Ambulatory Visit: Payer: Self-pay | Admitting: Family Medicine

## 2015-02-23 ENCOUNTER — Ambulatory Visit (INDEPENDENT_AMBULATORY_CARE_PROVIDER_SITE_OTHER): Payer: PPO | Admitting: *Deleted

## 2015-02-23 ENCOUNTER — Other Ambulatory Visit: Payer: Self-pay | Admitting: Family Medicine

## 2015-02-23 DIAGNOSIS — Z7901 Long term (current) use of anticoagulants: Secondary | ICD-10-CM

## 2015-02-23 DIAGNOSIS — I4891 Unspecified atrial fibrillation: Secondary | ICD-10-CM

## 2015-02-23 LAB — POCT INR: INR: 2.6

## 2015-03-02 ENCOUNTER — Encounter: Payer: Self-pay | Admitting: *Deleted

## 2015-03-02 NOTE — Progress Notes (Signed)
PCP received a letter from [employer] company needing a prior authorization for Phenobarbital.  PA placed in provider box for signature.  Clovis Pu, RN

## 2015-03-02 NOTE — Progress Notes (Signed)
PA approved for Phenobarbital 32.4 mg tablet per [facility].  PA approved from 03/01/2015-12/16/15.  Clovis Pu, RN

## 2015-03-16 ENCOUNTER — Ambulatory Visit (INDEPENDENT_AMBULATORY_CARE_PROVIDER_SITE_OTHER): Payer: PPO | Admitting: *Deleted

## 2015-03-16 DIAGNOSIS — I4891 Unspecified atrial fibrillation: Secondary | ICD-10-CM

## 2015-03-16 DIAGNOSIS — Z7901 Long term (current) use of anticoagulants: Secondary | ICD-10-CM

## 2015-03-16 LAB — POCT INR: INR: 4

## 2015-03-28 ENCOUNTER — Ambulatory Visit: Payer: PPO

## 2015-03-29 ENCOUNTER — Ambulatory Visit: Payer: PPO

## 2015-03-29 ENCOUNTER — Ambulatory Visit (INDEPENDENT_AMBULATORY_CARE_PROVIDER_SITE_OTHER): Payer: PPO | Admitting: *Deleted

## 2015-03-29 DIAGNOSIS — Z7901 Long term (current) use of anticoagulants: Secondary | ICD-10-CM

## 2015-03-29 DIAGNOSIS — I4891 Unspecified atrial fibrillation: Secondary | ICD-10-CM

## 2015-03-29 LAB — POCT INR: INR: 1.5

## 2015-04-11 ENCOUNTER — Ambulatory Visit (INDEPENDENT_AMBULATORY_CARE_PROVIDER_SITE_OTHER): Payer: PPO | Admitting: *Deleted

## 2015-04-11 DIAGNOSIS — I4891 Unspecified atrial fibrillation: Secondary | ICD-10-CM | POA: Diagnosis not present

## 2015-04-11 DIAGNOSIS — Z7901 Long term (current) use of anticoagulants: Secondary | ICD-10-CM | POA: Diagnosis not present

## 2015-04-11 LAB — POCT INR: INR: 1.9

## 2015-04-17 ENCOUNTER — Other Ambulatory Visit: Payer: Self-pay | Admitting: Family Medicine

## 2015-04-21 ENCOUNTER — Ambulatory Visit (INDEPENDENT_AMBULATORY_CARE_PROVIDER_SITE_OTHER): Payer: PPO | Admitting: Family Medicine

## 2015-04-21 ENCOUNTER — Encounter: Payer: Self-pay | Admitting: Family Medicine

## 2015-04-21 ENCOUNTER — Other Ambulatory Visit: Payer: Self-pay | Admitting: Family Medicine

## 2015-04-21 VITALS — BP 125/84 | HR 87 | Temp 97.6°F | Ht 62.0 in | Wt 214.2 lb

## 2015-04-21 DIAGNOSIS — J019 Acute sinusitis, unspecified: Secondary | ICD-10-CM | POA: Insufficient documentation

## 2015-04-21 DIAGNOSIS — J0100 Acute maxillary sinusitis, unspecified: Secondary | ICD-10-CM

## 2015-04-21 MED ORDER — FLUTICASONE PROPIONATE 50 MCG/ACT NA SUSP: 2.0000 | Freq: Every day | NASAL | Status: DC | PRN

## 2015-04-21 MED ORDER — LEVOFLOXACIN 750 MG PO TABS: 750.0000 mg | ORAL_TABLET | Freq: Every day | ORAL | Status: DC

## 2015-04-21 MED ORDER — GUAIFENESIN ER 600 MG PO TB12: 600.0000 mg | ORAL_TABLET | Freq: Two times a day (BID) | ORAL | Status: DC | PRN

## 2015-04-21 NOTE — Patient Instructions (Signed)
I have sent in 3 medications for you to pick up, one is the Flonase nasal spray we discussed today and proper use of it. The other is Mucinex, which will dry up the phlegm just use this for the next few days and then only as needed. Lastly is in antibiotics called Levaquin, that you will take 1 time a day for 5 days. Unfortunately all antibiotics, that you are not allergic to, will potentially has some effects on your INR. For this reason I would like you to come in on Monday to have your INR checked.  Make sure to drink plenty of fluids, take Tylenol or Motrin for any fevers aches or pains, and get plenty of rest.

## 2015-04-21 NOTE — Assessment & Plan Note (Signed)
Patient with signs and symptoms of maxillary sinusitis. Is allergic to penicillin derivatives, placed on Levaquin, Mucinex and Flonase today. Ice patient that Levaquin has a potential of affecting her INR, she is to come in to stay to have her INR checked. Return precautions discussed. Drink plenty of fluids, rest, and Advil or Motrin for comfort. Patient may need a humidifier on her sleep apnea machine. Follow-up in 2 weeks if no improvement.

## 2015-04-21 NOTE — Progress Notes (Signed)
Subjective:    Patient ID: Misty Gray, female    DOB: 06-01-1951, 64 y.o.   MRN: [id number]  HPI  Dry cough: Patient presents to family medicine clinic with a 2-1/2 week complaint of dry cough, hacking. Patient states that she has tried over-the-counter allergy medications and it is not working. She tried Robitussin DM, cough drops and honey which does not seem to be helping either. Patient does wear seat Pap at night, and requires 2 L of oxygen. She does not have a humidifier on her see pad. She states nothing makes her cough better, it does not seem to be worse at any particular time of the day. She denies any nausea, vomiting, sore throat or diarrhea. She does admit to itchy watery eyes, sneezing, rhinorrhea, congestion, headache and facial pressure. Former smoker  Past Medical History  Diagnosis Date  . Abnormal CT of the head 12/16/1984    R parietal atrophy  . Nonischemic cardiomyopathy     EF has normalized-repeat Pending   . Hypertension   . GERD (gastroesophageal reflux disease)   . Unspecified venous (peripheral) insufficiency   . Diaphragmatic hernia without mention of obstruction or gangrene   . Female stress incontinence   . Allergic rhinitis, cause unspecified   . Morbid obesity   . Lichenification and lichen simplex chronicus   . Pacemaker  st judes   . Heart murmur   . CHF (congestive heart failure)   . H/O hiatal hernia     two removed  . Anemia   . High cholesterol   . Pneumonia 2004; 2011; 11/2011  . Sleep apnea, obstructive     CPAP  . Exertional dyspnea 06/22/12  . Migraine, unspecified, without mention of intractable migraine without mention of status migrainosus     "when I was a teenager"  . Seizures     "can hear you talking but sounds like you are in big tunnel; have them often if not taking RX; last one was 06/17/12" (06/22/12)  . Stroke 1988    "mouth drawed real bad on my left side"  . Sick sinus syndrome     WITH PRIOR DDD PACEMAKER IMPLANTATION  .  Atrial fibrillation   . Syncope and collapse 06/22/12    "hit forehead and left knee"; denies loss of consciousness  . Arthritis     "hands and knees"  . Influenza A 01/11/2014   Allergies  Allergen Reactions  . Aspirin Hives  . Penicillins Rash and Other (See Comments)    "doesn't do the job it's suppose to do; I get real hyperactive and I break out in a rash"   Past Surgical History  Procedure Laterality Date  . Leg skin lesion  biopsy / excision  05/16/2001    Lichen Planus  . Hernia repair  ? 1988; 04/15/1998    ventral  . Insert / replace / remove pacemaker  12/16/1990    DDD EF 30%;  Marland Kitchen Insert / replace / remove pacemaker  07/25/2003    replaced by BB, leads are both IS1 and from 1992  . Insert / replace / remove pacemaker  05/21/2011    JA; generator change    Review of Systems Per HPI    Objective:   Physical Exam BP 125/84 mmHg  Pulse 87  Temp(Src) 97.6 F (36.4 C) (Oral)  Ht 5\' 2"  (1.575 m)  Wt 214 lb 3.2 oz (97.16 kg)  BMI 39.17 kg/m2 Gen: NAD. Nontoxic in appearance, well-developed, well-nourished, morbidly obese.  Caucasian female, pleasant. HEENT: AT. [place]. Bilateral TM unable to visualize due to bilateral cerumen impaction. Bilateral eyes without injections or icterus. MMM. Bilateral nares with severe erythema and swelling, patches of dried blood as well as yellow drainage. Throat without erythema or exudates. Cobblestoning present. Maxillary facial tenderness. CV: RRR  Chest: CTAB, no wheeze or crackles     Assessment & Plan:

## 2015-04-25 ENCOUNTER — Ambulatory Visit (INDEPENDENT_AMBULATORY_CARE_PROVIDER_SITE_OTHER): Payer: PPO | Admitting: *Deleted

## 2015-04-25 DIAGNOSIS — I4891 Unspecified atrial fibrillation: Secondary | ICD-10-CM

## 2015-04-25 DIAGNOSIS — Z7901 Long term (current) use of anticoagulants: Secondary | ICD-10-CM

## 2015-04-25 LAB — POCT INR: INR: 4.4

## 2015-05-04 ENCOUNTER — Ambulatory Visit (INDEPENDENT_AMBULATORY_CARE_PROVIDER_SITE_OTHER): Payer: PPO | Admitting: *Deleted

## 2015-05-04 DIAGNOSIS — I4891 Unspecified atrial fibrillation: Secondary | ICD-10-CM | POA: Diagnosis not present

## 2015-05-04 DIAGNOSIS — Z7901 Long term (current) use of anticoagulants: Secondary | ICD-10-CM | POA: Diagnosis not present

## 2015-05-04 LAB — POCT INR: INR: 3.3

## 2015-05-05 ENCOUNTER — Other Ambulatory Visit: Payer: Self-pay | Admitting: Family Medicine

## 2015-05-08 NOTE — Telephone Encounter (Signed)
Nursing staff - please call in Phenobarbital 32.4 mg two tablets PO BID, dispense #120, refill #5

## 2015-05-11 ENCOUNTER — Telehealth: Payer: Self-pay | Admitting: Family Medicine

## 2015-05-11 ENCOUNTER — Ambulatory Visit (INDEPENDENT_AMBULATORY_CARE_PROVIDER_SITE_OTHER): Payer: PPO | Admitting: *Deleted

## 2015-05-11 ENCOUNTER — Other Ambulatory Visit: Payer: Self-pay | Admitting: Family Medicine

## 2015-05-11 ENCOUNTER — Telehealth: Payer: Self-pay | Admitting: Cardiology

## 2015-05-11 DIAGNOSIS — I495 Sick sinus syndrome: Secondary | ICD-10-CM | POA: Diagnosis not present

## 2015-05-11 NOTE — Progress Notes (Signed)
Remote pacemaker transmission.   

## 2015-05-11 NOTE — Telephone Encounter (Signed)
Rx called into patient pharmacy. 

## 2015-05-11 NOTE — Telephone Encounter (Signed)
Pt says drugstore hasnt received RX for phenobarbital Please advise

## 2015-05-11 NOTE — Telephone Encounter (Signed)
Spoke with pt and reminded pt of remote transmission that is due today. Pt verbalized understanding.   

## 2015-05-12 NOTE — Telephone Encounter (Signed)
I called the medication into the pharmacy (it is unclear why the medication has not been called into the pharmacy by nursing staff at this time).  I have called the patient to inform her.

## 2015-05-18 ENCOUNTER — Ambulatory Visit (INDEPENDENT_AMBULATORY_CARE_PROVIDER_SITE_OTHER): Payer: PPO | Admitting: *Deleted

## 2015-05-18 DIAGNOSIS — I4891 Unspecified atrial fibrillation: Secondary | ICD-10-CM

## 2015-05-18 DIAGNOSIS — Z7901 Long term (current) use of anticoagulants: Secondary | ICD-10-CM | POA: Diagnosis not present

## 2015-05-18 LAB — POCT INR: INR: 3.2

## 2015-05-19 LAB — CUP PACEART REMOTE DEVICE CHECK
Battery Remaining Longevity: 70 mo
Battery Voltage: 2.93 V
Date Time Interrogation Session: 20160526132714
Lead Channel Impedance Value: 490 Ohm
Lead Channel Pacing Threshold Amplitude: 1 V
Lead Channel Pacing Threshold Pulse Width: 0.8 ms
Lead Channel Sensing Intrinsic Amplitude: 4.6 mV
Lead Channel Setting Pacing Amplitude: 2.5 V
Lead Channel Setting Pacing Pulse Width: 0.8 ms
MDC IDC MSMT BATTERY REMAINING PERCENTAGE: 67 %
MDC IDC SET LEADCHNL RV SENSING SENSITIVITY: 1.5 mV
MDC IDC STAT BRADY RV PERCENT PACED: 74 %
Pulse Gen Model: 2210
Pulse Gen Serial Number: 7254513

## 2015-05-24 ENCOUNTER — Encounter: Payer: Self-pay | Admitting: Cardiology

## 2015-05-24 DIAGNOSIS — Y9289 Other specified places as the place of occurrence of the external cause: Secondary | ICD-10-CM | POA: Insufficient documentation

## 2015-05-24 DIAGNOSIS — S299XXA Unspecified injury of thorax, initial encounter: Secondary | ICD-10-CM | POA: Diagnosis not present

## 2015-05-24 DIAGNOSIS — G40909 Epilepsy, unspecified, not intractable, without status epilepticus: Secondary | ICD-10-CM | POA: Insufficient documentation

## 2015-05-24 DIAGNOSIS — W01198A Fall on same level from slipping, tripping and stumbling with subsequent striking against other object, initial encounter: Secondary | ICD-10-CM | POA: Insufficient documentation

## 2015-05-24 DIAGNOSIS — G4733 Obstructive sleep apnea (adult) (pediatric): Secondary | ICD-10-CM | POA: Diagnosis not present

## 2015-05-24 DIAGNOSIS — Z7901 Long term (current) use of anticoagulants: Secondary | ICD-10-CM | POA: Diagnosis not present

## 2015-05-24 DIAGNOSIS — Z862 Personal history of diseases of the blood and blood-forming organs and certain disorders involving the immune mechanism: Secondary | ICD-10-CM | POA: Diagnosis not present

## 2015-05-24 DIAGNOSIS — Z79899 Other long term (current) drug therapy: Secondary | ICD-10-CM | POA: Diagnosis not present

## 2015-05-24 DIAGNOSIS — Y998 Other external cause status: Secondary | ICD-10-CM | POA: Diagnosis not present

## 2015-05-24 DIAGNOSIS — Y9389 Activity, other specified: Secondary | ICD-10-CM | POA: Insufficient documentation

## 2015-05-24 DIAGNOSIS — I509 Heart failure, unspecified: Secondary | ICD-10-CM | POA: Diagnosis not present

## 2015-05-24 DIAGNOSIS — Z7951 Long term (current) use of inhaled steroids: Secondary | ICD-10-CM | POA: Insufficient documentation

## 2015-05-24 DIAGNOSIS — Z8709 Personal history of other diseases of the respiratory system: Secondary | ICD-10-CM | POA: Insufficient documentation

## 2015-05-24 DIAGNOSIS — I10 Essential (primary) hypertension: Secondary | ICD-10-CM | POA: Insufficient documentation

## 2015-05-24 DIAGNOSIS — Z880 Allergy status to penicillin: Secondary | ICD-10-CM | POA: Diagnosis not present

## 2015-05-24 DIAGNOSIS — Z872 Personal history of diseases of the skin and subcutaneous tissue: Secondary | ICD-10-CM | POA: Diagnosis not present

## 2015-05-24 DIAGNOSIS — M1990 Unspecified osteoarthritis, unspecified site: Secondary | ICD-10-CM | POA: Insufficient documentation

## 2015-05-24 DIAGNOSIS — Z87448 Personal history of other diseases of urinary system: Secondary | ICD-10-CM | POA: Insufficient documentation

## 2015-05-24 DIAGNOSIS — S3992XA Unspecified injury of lower back, initial encounter: Secondary | ICD-10-CM | POA: Insufficient documentation

## 2015-05-24 DIAGNOSIS — Z87891 Personal history of nicotine dependence: Secondary | ICD-10-CM | POA: Insufficient documentation

## 2015-05-24 DIAGNOSIS — I4891 Unspecified atrial fibrillation: Secondary | ICD-10-CM | POA: Diagnosis not present

## 2015-05-24 DIAGNOSIS — Z8701 Personal history of pneumonia (recurrent): Secondary | ICD-10-CM | POA: Insufficient documentation

## 2015-05-24 DIAGNOSIS — Z8673 Personal history of transient ischemic attack (TIA), and cerebral infarction without residual deficits: Secondary | ICD-10-CM | POA: Insufficient documentation

## 2015-05-24 DIAGNOSIS — K219 Gastro-esophageal reflux disease without esophagitis: Secondary | ICD-10-CM | POA: Diagnosis not present

## 2015-05-24 DIAGNOSIS — Z792 Long term (current) use of antibiotics: Secondary | ICD-10-CM | POA: Diagnosis not present

## 2015-05-24 DIAGNOSIS — Z950 Presence of cardiac pacemaker: Secondary | ICD-10-CM | POA: Insufficient documentation

## 2015-05-24 DIAGNOSIS — R011 Cardiac murmur, unspecified: Secondary | ICD-10-CM | POA: Diagnosis not present

## 2015-05-25 ENCOUNTER — Emergency Department (HOSPITAL_COMMUNITY): Payer: PPO

## 2015-05-25 ENCOUNTER — Emergency Department (HOSPITAL_COMMUNITY)
Admission: EM | Admit: 2015-05-25 | Discharge: 2015-05-25 | Disposition: A | Discharge status: Home / Self Care | Payer: PPO | Attending: Emergency Medicine | Admitting: Emergency Medicine

## 2015-05-25 ENCOUNTER — Encounter (HOSPITAL_COMMUNITY): Payer: Self-pay | Admitting: Emergency Medicine

## 2015-05-25 DIAGNOSIS — W19XXXA Unspecified fall, initial encounter: Secondary | ICD-10-CM

## 2015-05-25 DIAGNOSIS — M549 Dorsalgia, unspecified: Secondary | ICD-10-CM

## 2015-05-25 LAB — URINE MICROSCOPIC-ADD ON

## 2015-05-25 LAB — CBC WITH DIFFERENTIAL/PLATELET
BASOS ABS: 0 10*3/uL (ref 0.0–0.1)
BASOS PCT: 0 % (ref 0–1)
Eosinophils Absolute: 0.1 10*3/uL (ref 0.0–0.7)
Eosinophils Relative: 1 % (ref 0–5)
HCT: 43.1 % (ref 36.0–46.0)
HEMOGLOBIN: 14.1 g/dL (ref 12.0–15.0)
LYMPHS ABS: 2 10*3/uL (ref 0.7–4.0)
Lymphocytes Relative: 28 % (ref 12–46)
MCH: 31.1 pg (ref 26.0–34.0)
MCHC: 32.7 g/dL (ref 30.0–36.0)
MCV: 94.9 fL (ref 78.0–100.0)
MONOS PCT: 7 % (ref 3–12)
Monocytes Absolute: 0.5 10*3/uL (ref 0.1–1.0)
NEUTROS PCT: 64 % (ref 43–77)
Neutro Abs: 4.5 10*3/uL (ref 1.7–7.7)
Platelets: 137 10*3/uL — ABNORMAL LOW (ref 150–400)
RBC: 4.54 MIL/uL (ref 3.87–5.11)
RDW: 15 % (ref 11.5–15.5)
WBC: 7.1 10*3/uL (ref 4.0–10.5)

## 2015-05-25 LAB — URINALYSIS, ROUTINE W REFLEX MICROSCOPIC
Bilirubin Urine: NEGATIVE
Glucose, UA: NEGATIVE mg/dL
Ketones, ur: NEGATIVE mg/dL
LEUKOCYTES UA: NEGATIVE
Nitrite: NEGATIVE
Protein, ur: NEGATIVE mg/dL
SPECIFIC GRAVITY, URINE: 1.022 (ref 1.005–1.030)
UROBILINOGEN UA: 2 mg/dL — AB (ref 0.0–1.0)
pH: 5.5 (ref 5.0–8.0)

## 2015-05-25 LAB — PROTIME-INR
INR: 5.11 (ref 0.00–1.49)
PROTHROMBIN TIME: 45.6 s — AB (ref 11.6–15.2)

## 2015-05-25 LAB — I-STAT CHEM 8, ED
BUN: 22 mg/dL — AB (ref 6–20)
CHLORIDE: 106 mmol/L (ref 101–111)
Calcium, Ion: 1.18 mmol/L (ref 1.13–1.30)
Creatinine, Ser: 1 mg/dL (ref 0.44–1.00)
GLUCOSE: 106 mg/dL — AB (ref 65–99)
HEMATOCRIT: 46 % (ref 36.0–46.0)
HEMOGLOBIN: 15.6 g/dL — AB (ref 12.0–15.0)
Potassium: 4.4 mmol/L (ref 3.5–5.1)
Sodium: 142 mmol/L (ref 135–145)
TCO2: 23 mmol/L (ref 0–100)

## 2015-05-25 NOTE — ED Provider Notes (Signed)
CSN: [id number]     Arrival date & time 05/24/15  2340 History   None    This chart was scribed for [name], MD by Arlan Organ, ED Scribe. This patient was seen in room [facility] and the patient's care was started 12:56 AM.   Chief Complaint  Patient presents with  . Fall   The history is provided by the patient. No language interpreter was used.    HPI Comments: Misty Gray is a 64 y.o. female with a PMHx of HTN, CHF, hyperlipidemia, stroke, A-Fib who presents to the Emergency Department here after a fall sustained just prior to arrival. Pt states she fell backwards in a chair and landed on her back against a chainsaw case. No head trauma or LOC. She now c/o constant, ongoing, unchanged lower back pain that radiates up her back. Pain is exacerbated with palpation and certain movements without any alleviating factors at this time. No OTC medications or home remedies attempted prior to arrival. No nausea, vomiting, visual changes, or abdominal pain. She is currently on Coumadin daily for history of Atrial Fibrillation. Pt with known allergy to ASA and Penicillins.   Past Medical History  Diagnosis Date  . Abnormal CT of the head 12/16/1984    R parietal atrophy  . Nonischemic cardiomyopathy     EF has normalized-repeat Pending   . Hypertension   . GERD (gastroesophageal reflux disease)   . Unspecified venous (peripheral) insufficiency   . Diaphragmatic hernia without mention of obstruction or gangrene   . Female stress incontinence   . Allergic rhinitis, cause unspecified   . Morbid obesity   . Lichenification and lichen simplex chronicus   . Pacemaker  st judes   . Heart murmur   . CHF (congestive heart failure)   . H/O hiatal hernia     two removed  . Anemia   . High cholesterol   . Pneumonia 2004; 2011; 11/2011  . Sleep apnea, obstructive     CPAP  . Exertional dyspnea 06/22/12  . Migraine, unspecified, without mention of intractable migraine without mention of status  migrainosus     "when I was a teenager"  . Seizures     "can hear you talking but sounds like you are in big tunnel; have them often if not taking RX; last one was 06/17/12" (06/22/12)  . Stroke 1988    "mouth drawed real bad on my left side"  . Sick sinus syndrome     WITH PRIOR DDD PACEMAKER IMPLANTATION  . Atrial fibrillation   . Syncope and collapse 06/22/12    "hit forehead and left knee"; denies loss of consciousness  . Arthritis     "hands and knees"  . Influenza A 01/11/2014   Past Surgical History  Procedure Laterality Date  . Leg skin lesion  biopsy / excision  05/16/2001    Lichen Planus  . Hernia repair  ? 1988; 04/15/1998    ventral  . Insert / replace / remove pacemaker  12/16/1990    DDD EF 30%;  Marland Kitchen Insert / replace / remove pacemaker  07/25/2003    replaced by BB, leads are both IS1 and from 1992  . Insert / replace / remove pacemaker  05/21/2011    JA; generator change   Family History  Problem Relation Age of Onset  . Stroke Father     died from CAD?  Marland Kitchen Cancer Mother     melanoma  . Diabetes Son  Type 1  . Sleep apnea Son   . Cancer Sister     cervical  . Hypertension Sister   . Hypertension Brother   . Rheum arthritis Daughter     Also PGM, PGGM   History  Substance Use Topics  . Smoking status: Former Smoker -- 1.00 packs/day    Types: Cigarettes    Quit date: 03/16/1969  . Smokeless tobacco: Never Used  . Alcohol Use: No   OB History    No data available     Review of Systems  Constitutional: Negative for fever and chills.  Eyes: Negative for visual disturbance.  Gastrointestinal: Negative for nausea, vomiting and abdominal pain.  Musculoskeletal: Positive for back pain.  All other systems reviewed and are negative.     Allergies  Aspirin and Penicillins  Home Medications   Prior to Admission medications   Medication Sig Start Date End Date Taking? Authorizing Provider  [employer] VITAMIN D3 1000 UNITS capsule TAKE ONE CAPSULE BY MOUTH EVERY  MORNING 04/21/15   Doreene Eland, MD  digoxin (LANOXIN) 0.125 MG tablet TAKE 1 TABLET (125 MCG TOTAL) BY MOUTH DAILY. 02/23/15   Uvaldo Rising, MD  enalapril (VASOTEC) 5 MG tablet TAKE 1 TABLET BY MOUTH DAILY 01/13/15   Uvaldo Rising, MD  fluticasone [facility]) 50 MCG/ACT nasal spray Place 2 sprays into both nostrils daily as needed. For congestion. 04/21/15   Renee A Kuneff, DO  furosemide (LASIX) 80 MG tablet TAKE ONE TABLET IN THE AM AND 1/2 TABLET IN THE PM 06/15/14   Uvaldo Rising, MD  guaiFENesin (MUCINEX) 600 MG 12 hr tablet Take 1 tablet (600 mg total) by mouth 2 (two) times daily as needed. 04/21/15   Renee A Kuneff, DO  halobetasol (ULTRAVATE) 0.05 % cream Apply 1 application topically 2 (two) times daily. Use on legs    Historical Provider, MD  levofloxacin (LEVAQUIN) 750 MG tablet Take 1 tablet (750 mg total) by mouth daily. 04/21/15   Renee A Kuneff, DO  metoprolol succinate (TOPROL-XL) 25 MG 24 hr tablet TAKE 1 TABLET (25 MG TOTAL) BY MOUTH DAILY. 12/19/14   Uvaldo Rising, MD  omeprazole (PRILOSEC) 20 MG capsule TAKE 1 CAPSULE (20 MG TOTAL) BY MOUTH DAILY. 04/17/15   Leighton Roach McDiarmid, MD  PHENobarbital (LUMINAL) 32.4 MG tablet GIVE 2 TABLETS BY MOUTH EVERY MORNING AND 2 AT BEDTIME 05/08/15   Uvaldo Rising, MD  spironolactone (ALDACTONE) 25 MG tablet TAKE 1 TABLET (25 MG TOTAL) BY MOUTH DAILY. 01/23/15   Uvaldo Rising, MD  warfarin (COUMADIN) 5 MG tablet Take daily as directed. 02/08/15   Uvaldo Rising, MD   Triage Vitals: BP 118/74 mmHg  Pulse 67  Temp(Src) 98.2 F (36.8 C) (Oral)  Resp 16  SpO2 99%   Physical Exam  Constitutional: She is oriented to person, place, and time. She appears well-developed and well-nourished.  HENT:  Head: Normocephalic and atraumatic.  Right Ear: External ear normal.  Left Ear: External ear normal.  Eyes: Conjunctivae and EOM are normal. Pupils are equal, round, and reactive to light.  Neck: Normal range of motion. Neck supple.  Cardiovascular: Normal rate,  regular rhythm, normal heart sounds and intact distal pulses.   Pulmonary/Chest: Effort normal and breath sounds normal.  Abdominal: Soft. Bowel sounds are normal. There is no tenderness.  Musculoskeletal: Normal range of motion.       Cervical back: Normal.       Thoracic back: She exhibits tenderness  and bony tenderness.       Lumbar back: She exhibits tenderness and bony tenderness.  Neurological: She is alert and oriented to person, place, and time. She has normal strength and normal reflexes. No cranial nerve deficit or sensory deficit. Coordination normal.  Skin: Skin is warm and dry.  Vitals reviewed.   ED Course  Procedures (including critical care time)  DIAGNOSTIC STUDIES: Oxygen Saturation is 100% on RA, Normal by my interpretation.    COORDINATION OF CARE: 1:04 AM-Will order CT head without contrast, DG thoracic spine 4V, DG lumbar spine complete, CBC, i-stat chem 8, urinalysis, and Protime-INR. Discussed treatment plan with pt at bedside and pt agreed to plan.     Labs Review Labs Reviewed  CBC WITH DIFFERENTIAL/PLATELET - Abnormal; Notable for the following:    Platelets 137 (*)    All other components within normal limits  PROTIME-INR - Abnormal; Notable for the following:    Prothrombin Time 45.6 (*)    INR 5.11 (*)    All other components within normal limits  URINALYSIS, ROUTINE W REFLEX MICROSCOPIC (NOT AT [facility]) - Abnormal; Notable for the following:    Hgb urine dipstick SMALL (*)    Urobilinogen, UA 2.0 (*)    All other components within normal limits  URINE MICROSCOPIC-ADD ON - Abnormal; Notable for the following:    Squamous Epithelial / LPF FEW (*)    Bacteria, UA FEW (*)    All other components within normal limits  I-STAT CHEM 8, ED - Abnormal; Notable for the following:    BUN 22 (*)    Glucose, Bld 106 (*)    Hemoglobin 15.6 (*)    All other components within normal limits    Imaging Review Dg Thoracic Spine 2 View  05/25/2015   CLINICAL DATA:   Back pain after falling out of a chair today  EXAM: THORACIC SPINE - 2 VIEW  COMPARISON:  None.  FINDINGS: There is moderate kyphoscoliosis. The thoracic vertebral bodies are normal in height. There is no evidence of an acute fracture. There is moderate degenerative disc change, particularly in the mid and lower thoracic spine. There is no bone lesion or bony destruction.  IMPRESSION: Curvature and degenerative change.  Negative for acute fracture.   Electronically Signed   By: Ellery Plunk M.D.   On: 05/25/2015 02:18   Dg Lumbar Spine Complete  05/25/2015   CLINICAL DATA:  Back pain after falling out of a chair today.  EXAM: LUMBAR SPINE - COMPLETE 4+ VIEW  COMPARISON:  None.  FINDINGS: There is no evidence of lumbar spine fracture. Alignment is normal. Intervertebral disc spaces are maintained.  IMPRESSION: Negative.   Electronically Signed   By: Ellery Plunk M.D.   On: 05/25/2015 02:19   Ct Head Wo Contrast  05/25/2015   CLINICAL DATA:  Patient fell backwards and hit head. Anticoagulated.  EXAM: CT HEAD WITHOUT CONTRAST  TECHNIQUE: Contiguous axial images were obtained from the base of the skull through the vertex without intravenous contrast.  COMPARISON:  None.  FINDINGS: Extensive chronic infarctions in the RIGHT hemisphere affecting the frontal and parieto-occipital lobes. Generalized atrophy. Patulous sylvian fissures. Hypoattenuation of white matter, likely small vessel disease. No acute stroke, acute hemorrhage, mass lesion, hydrocephalus, or extra-axial fluid. Predominantly cortical atrophy. Hyperostosis. No calvarial fracture. Mild vascular calcification. Sinus air-fluid level on the LEFT the maxillary region. Mild ethmoid sinus fluid. Dense lenticular opacities.  IMPRESSION: Chronic changes as described. Similar appearance to 2014. No acute  intracranial abnormality. Specifically no hemorrhage in this patient who is anticoagulated.   Electronically Signed   By: Davonna Belling M.D.   On:  05/25/2015 02:33     EKG Interpretation None      MDM   Final diagnoses:  Back pain  Fall, initial encounter    64 y.o. female with pertinent PMH of afib on coumadin presents with back pain after mechanical fall with hit to back (chair broke).  On arrival pt with minimal symptoms, but exam as above with tenderness.  Wu as above with supratherapeutic INR.  No bleeding at this time.  Will have pt hold next dose of coumadin, and this was written in DC paperwork.  DC home in stable condition.    I have reviewed all laboratory and imaging studies if ordered as above  1. Fall, initial encounter   2. Back pain           [name], MD 05/25/15 [phone]

## 2015-05-25 NOTE — ED Notes (Signed)
EDP made aware about pt's INR. No new orders. Will continue to monitor.

## 2015-05-25 NOTE — ED Notes (Addendum)
Patient fell backwards in a chair and hit her back on a chainsaw case.  Patient complaining of pain in left lower back.  Patient is on coumadin.  No bruising noted to lower left back.  Patient states pain radiates up her back.

## 2015-05-25 NOTE — Discharge Instructions (Signed)
Your INR is 5.11.  You should hold your next dose, then continue the next day and followup with your PCP for INR recheck.  If you develop worsening symptoms of HA, vision change, nausea, vomiting, return immediately to the ED. Fall Prevention and Home Safety Falls cause injuries and can affect all age groups. It is possible to use preventive measures to significantly decrease the likelihood of falls. There are many simple measures which can make your home safer and prevent falls. OUTDOORS  Repair cracks and edges of walkways and driveways.  Remove high doorway thresholds.  Trim shrubbery on the main path into your home.  Have good outside lighting.  Clear walkways of tools, rocks, debris, and clutter.  Check that handrails are not broken and are securely fastened. Both sides of steps should have handrails.  Have leaves, snow, and ice cleared regularly.  Use sand or salt on walkways during winter months.  In the garage, clean up grease or oil spills. BATHROOM  Install night lights.  Install grab bars by the toilet and in the tub and shower.  Use non-skid mats or decals in the tub or shower.  Place a plastic non-slip stool in the shower to sit on, if needed.  Keep floors dry and clean up all water on the floor immediately.  Remove soap buildup in the tub or shower on a regular basis.  Secure bath mats with non-slip, double-sided rug tape.  Remove throw rugs and tripping hazards from the floors. BEDROOMS  Install night lights.  Make sure a bedside light is easy to reach.  Do not use oversized bedding.  Keep a telephone by your bedside.  Have a firm chair with side arms to use for getting dressed.  Remove throw rugs and tripping hazards from the floor. KITCHEN  Keep handles on pots and pans turned toward the center of the stove. Use back burners when possible.  Clean up spills quickly and allow time for drying.  Avoid walking on wet floors.  Avoid hot utensils  and knives.  Position shelves so they are not too high or low.  Place commonly used objects within easy reach.  If necessary, use a sturdy step stool with a grab bar when reaching.  Keep electrical cables out of the way.  Do not use floor polish or wax that makes floors slippery. If you must use wax, use non-skid floor wax.  Remove throw rugs and tripping hazards from the floor. STAIRWAYS  Never leave objects on stairs.  Place handrails on both sides of stairways and use them. Fix any loose handrails. Make sure handrails on both sides of the stairways are as long as the stairs.  Check carpeting to make sure it is firmly attached along stairs. Make repairs to worn or loose carpet promptly.  Avoid placing throw rugs at the top or bottom of stairways, or properly secure the rug with carpet tape to prevent slippage. Get rid of throw rugs, if possible.  Have an electrician put in a light switch at the top and bottom of the stairs. OTHER FALL PREVENTION TIPS  Wear low-heel or rubber-soled shoes that are supportive and fit well. Wear closed toe shoes.  When using a stepladder, make sure it is fully opened and both spreaders are firmly locked. Do not climb a closed stepladder.  Add color or contrast paint or tape to grab bars and handrails in your home. Place contrasting color strips on first and last steps.  Learn and use mobility aids  as needed. Install an electrical emergency response system.  Turn on lights to avoid dark areas. Replace light bulbs that burn out immediately. Get light switches that glow.  Arrange furniture to create clear pathways. Keep furniture in the same place.  Firmly attach carpet with non-skid or double-sided tape.  Eliminate uneven floor surfaces.  Select a carpet pattern that does not visually hide the edge of steps.  Be aware of all pets. OTHER HOME SAFETY TIPS  Set the water temperature for 120 F (48.8 C).  Keep emergency numbers on or near  the telephone.  Keep smoke detectors on every level of the home and near sleeping areas. Document Released: 11/22/2002 Document Revised: 06/02/2012 Document Reviewed: 02/21/2012 [facility] Patient Information 2015 [place], [place]. This information is not intended to replace advice given to you by your health care provider. Make sure you discuss any questions you have with your health care provider. Back Pain, Adult Low back pain is very common. About 1 in 5 people have back pain.The cause of low back pain is rarely dangerous. The pain often gets better over time.About half of people with a sudden onset of back pain feel better in just 2 weeks. About 8 in 10 people feel better by 6 weeks.  CAUSES Some common causes of back pain include:  Strain of the muscles or ligaments supporting the spine.  Wear and tear (degeneration) of the spinal discs.  Arthritis.  Direct injury to the back. DIAGNOSIS Most of the time, the direct cause of low back pain is not known.However, back pain can be treated effectively even when the exact cause of the pain is unknown.Answering your caregiver's questions about your overall health and symptoms is one of the most accurate ways to make sure the cause of your pain is not dangerous. If your caregiver needs more information, he or she may order lab work or imaging tests (X-rays or MRIs).However, even if imaging tests show changes in your back, this usually does not require surgery. HOME CARE INSTRUCTIONS For many people, back pain returns.Since low back pain is rarely dangerous, it is often a condition that people can learn to [facility] their own.   Remain active. It is stressful on the back to sit or stand in one place. Do not sit, drive, or stand in one place for more than 30 minutes at a time. Take short walks on level surfaces as soon as pain allows.Try to increase the length of time you walk each day.  Do not stay in bed.Resting more than 1 or 2 days can  delay your recovery.  Do not avoid exercise or work.Your body is made to move.It is not dangerous to be active, even though your back may hurt.Your back will likely heal faster if you return to being active before your pain is gone.  Pay attention to your body when you bend and lift. Many people have less discomfortwhen lifting if they bend their knees, keep the load close to their bodies,and avoid twisting. Often, the most comfortable positions are those that put less stress on your recovering back.  Find a comfortable position to sleep. Use a firm mattress and lie on your side with your knees slightly bent. If you lie on your back, put a pillow under your knees.  Only take over-the-counter or prescription medicines as directed by your caregiver. Over-the-counter medicines to reduce pain and inflammation are often the most helpful.Your caregiver may prescribe muscle relaxant drugs.These medicines help dull your pain so you can  more quickly return to your normal activities and healthy exercise.  Put ice on the injured area.  Put ice in a plastic bag.  Place a towel between your skin and the bag.  Leave the ice on for 15-20 minutes, 03-04 times a day for the first 2 to 3 days. After that, ice and heat may be alternated to reduce pain and spasms.  Ask your caregiver about trying back exercises and gentle massage. This may be of some benefit.  Avoid feeling anxious or stressed.Stress increases muscle tension and can worsen back pain.It is important to recognize when you are anxious or stressed and learn ways to manage it.Exercise is a great option. SEEK MEDICAL CARE IF:  You have pain that is not relieved with rest or medicine.  You have pain that does not improve in 1 week.  You have new symptoms.  You are generally not feeling well. SEEK IMMEDIATE MEDICAL CARE IF:   You have pain that radiates from your back into your legs.  You develop new bowel or bladder control  problems.  You have unusual weakness or numbness in your arms or legs.  You develop nausea or vomiting.  You develop abdominal pain.  You feel faint. Document Released: 12/02/2005 Document Revised: 06/02/2012 Document Reviewed: 04/05/2014 [facility] Patient Information 2015 [place], [place]. This information is not intended to replace advice given to you by your health care provider. Make sure you discuss any questions you have with your health care provider.

## 2015-05-25 NOTE — ED Notes (Signed)
MD at bedside. 

## 2015-05-31 ENCOUNTER — Encounter: Payer: Self-pay | Admitting: Internal Medicine

## 2015-06-01 ENCOUNTER — Ambulatory Visit (INDEPENDENT_AMBULATORY_CARE_PROVIDER_SITE_OTHER): Payer: PPO | Admitting: *Deleted

## 2015-06-01 DIAGNOSIS — Z7901 Long term (current) use of anticoagulants: Secondary | ICD-10-CM | POA: Diagnosis not present

## 2015-06-01 DIAGNOSIS — I4891 Unspecified atrial fibrillation: Secondary | ICD-10-CM | POA: Diagnosis not present

## 2015-06-01 LAB — POCT INR: INR: 3.7

## 2015-06-13 ENCOUNTER — Ambulatory Visit: Payer: PPO

## 2015-06-13 ENCOUNTER — Ambulatory Visit: Payer: PPO | Admitting: Family Medicine

## 2015-06-16 ENCOUNTER — Other Ambulatory Visit: Payer: Self-pay | Admitting: Family Medicine

## 2015-07-03 ENCOUNTER — Telehealth: Payer: Self-pay | Admitting: Family Medicine

## 2015-07-03 NOTE — Telephone Encounter (Signed)
Spoke with patient. She relies on son for transportation. "son is not a morning person" and is having trouble getting in to see me in the AM. She also reports increased swelling of her legs.   RN staff - please call patient and arrange a follow up appointment (if she can not come in the AM than she needs to see another provider at this time).

## 2015-07-03 NOTE — Telephone Encounter (Signed)
Tried calling patient, received message 'wireless customer unavailable at this time". Will try call again later.

## 2015-07-03 NOTE — Telephone Encounter (Signed)
Pt called because she missed her appointment in June. She needs to be seen but Dr. Randolm Idol doesn't do afternoons and she can only get here in the afternoon. I suggested that she she another doctor on the red team. She would like to speak to Dr. Randolm Idol since he wanted to do a special test and her legs and also some blood work. Please call to discuss. jw

## 2015-07-10 ENCOUNTER — Other Ambulatory Visit: Payer: Self-pay | Admitting: Family Medicine

## 2015-07-11 NOTE — Telephone Encounter (Signed)
Patient scheduled with Dr. Leonides Schanz for 7/29.

## 2015-07-12 ENCOUNTER — Other Ambulatory Visit: Payer: Self-pay | Admitting: Family Medicine

## 2015-07-14 ENCOUNTER — Ambulatory Visit (INDEPENDENT_AMBULATORY_CARE_PROVIDER_SITE_OTHER): Payer: PPO | Admitting: *Deleted

## 2015-07-14 ENCOUNTER — Ambulatory Visit (INDEPENDENT_AMBULATORY_CARE_PROVIDER_SITE_OTHER): Payer: PPO | Admitting: Family Medicine

## 2015-07-14 ENCOUNTER — Encounter: Payer: Self-pay | Admitting: Family Medicine

## 2015-07-14 VITALS — BP 121/95 | HR 71 | Temp 98.0°F | Wt 208.0 lb

## 2015-07-14 DIAGNOSIS — I5022 Chronic systolic (congestive) heart failure: Secondary | ICD-10-CM

## 2015-07-14 DIAGNOSIS — I4891 Unspecified atrial fibrillation: Secondary | ICD-10-CM | POA: Diagnosis not present

## 2015-07-14 DIAGNOSIS — Z7901 Long term (current) use of anticoagulants: Secondary | ICD-10-CM | POA: Diagnosis not present

## 2015-07-14 LAB — POCT INR: INR: 2.1

## 2015-07-14 NOTE — Progress Notes (Signed)
Patient ID: Misty Gray, female   DOB: Mar 02, 1951, 64 y.o.   MRN: [id number]    Subjective: CC:Leg swelling  HPI: Patient is a 64 y.o. female with a past medical history of a fib, systolic CHF, HTN, stasis dermatitis of the LE,  presenting to [facility] clinic today for leg swelling.  Notes feet swelling x several weeks. She's been taking Lasix daily. She's prescribed  in AM and  in the PM, but states she's been taking  in the AM so she doesn't have to urinate all night. Discussed how Lasix works. Patient states she's had a good response to  previously. She feels the swelling is stable but is very frustrated that it is still present. States her PCP, Dr. Randolm Idol was going to do some sort testing on her legs but wasn't sure what. I do not see any recent appts with her PCP or any mention of lab work. She endorses pain in her feet when she tries to get them into her shoes. Denies worsening LE pain with walking. No joint swelling noted. No falls. No change in diet or fluid intake. Denies any worsening SOB. No chest pain, cough, PND, or orthopnea.   Social History: former smoker   ROS: All other systems reviewed and are negative.  Past Medical History Patient Active Problem List   Diagnosis Date Noted  . Acute sinusitis 04/21/2015  . Preventative health care 10/05/2014  . Breast mass, left 10/05/2014  . Vaginal discharge 10/05/2014  . Osteopenia 09/15/2014  . Influenza A 01/11/2014  . Acute on chronic combined systolic and diastolic CHF (congestive heart failure) 01/11/2014  . Chest pain, negative MI 01/09/2014  . Stasis dermatitis of both legs 01/04/2014  . Hypertension 01/04/2014  . Symptomatic bradycardia 05/05/2013  . Chronic systolic dysfunction of left ventricle 05/05/2013  . Pain in joint of right shoulder region 04/06/2013  . At high risk for falls 02/11/2013  . Vitamin D deficiency 07/15/2012  . Atrial fibrillation 06/14/2012  . Sinoatrial node dysfunction 05/08/2011  .  CARDIOMYOPATHY, PRIMARY, DILATED 08/10/2009  . PACEMAKER, PERMANENT 08/10/2009  . ALLERGIC RHINITIS 05/31/2008  . LICHEN SIMPLEX CHRONICUS 05/05/2007  . OBESITY, MORBID 02/12/2007  . MIGRAINE, UNSPEC., W/O INTRACTABLE MIGRAINE 02/12/2007  . CHF - EJECTION FRACTION < 50% 02/12/2007  . VENOUS INSUFFICIENCY, CHRONIC 02/12/2007  . GASTROESOPHAGEAL REFLUX, NO ESOPHAGITIS 02/12/2007  . INCONTINENCE, STRESS, FEMALE 02/12/2007  . CONVULSIONS, SEIZURES, NOS 02/12/2007  . APNEA, SLEEP 02/12/2007    Medications- reviewed and updated Current Outpatient Prescriptions  Medication Sig Dispense Refill  . [employer] VITAMIN D3 1000 UNITS capsule TAKE ONE CAPSULE BY MOUTH EVERY MORNING 90 capsule 0  . digoxin (LANOXIN) 0.125 MG tablet TAKE 1 TABLET (125 MCG TOTAL) BY MOUTH DAILY. 90 tablet 1  . enalapril (VASOTEC) 5 MG tablet TAKE 1 TABLET BY MOUTH DAILY 90 tablet 1  . fluticasone (FLONASE) 50 MCG/ACT nasal spray Place 2 sprays into both nostrils daily as needed. For congestion. 16 g 1  . furosemide (LASIX) 80 MG tablet TAKE ONE TABLET IN THE AM AND 1/2 TABLET IN THE PM 60 tablet 2  . guaiFENesin (MUCINEX) 600 MG 12 hr tablet Take 1 tablet (600 mg total) by mouth 2 (two) times daily as needed. 30 tablet 0  . halobetasol (ULTRAVATE) 0.05 % cream Apply 1 application topically 2 (two) times daily. Use on legs    . levofloxacin (LEVAQUIN) 750 MG tablet Take 1 tablet (750 mg total) by mouth daily. 5 tablet 0  . metoprolol  succinate (TOPROL-XL) 25 MG 24 hr tablet TAKE 1 TABLET (25 MG TOTAL) BY MOUTH DAILY. 90 tablet 1  . omeprazole (PRILOSEC) 20 MG capsule TAKE 1 CAPSULE (20 MG TOTAL) BY MOUTH DAILY. 90 capsule 1  . PHENobarbital (LUMINAL) 32.4 MG tablet GIVE 2 TABLETS BY MOUTH EVERY MORNING AND 2 AT BEDTIME 120 tablet 5  . spironolactone (ALDACTONE) 25 MG tablet TAKE 1 TABLET (25 MG TOTAL) BY MOUTH DAILY. 90 tablet 1  . warfarin (COUMADIN) 5 MG tablet TAKE DAILY AS DIRECTED. 75 tablet 2   No current  facility-administered medications for this visit.    Objective: Office vital signs reviewed. BP 121/95 mmHg  Pulse 71  Temp(Src) 98 F (36.7 C) (Oral)  Wt 208 lb (94.348 kg)   Physical Examination:  General: Awake, alert, well nourished, NAD Cardio: Irregular rate and rhythm. No murmurs. Very faint DP pulses.  Pulm: No increased WOB.  CTAB, without wheezes, rhonchi or crackles noted.  MSK: Normal bulk and tone. 1-2+ itting edema of the LEs up to mid-calf b/l. No cords noted.  No TTP of the calves or size discrepancy. Toes are warm with good cap refill.   Skin: chronic venous stasis changes to teh LE b/l.   Assessment/Plan: CHF - EJECTION FRACTION < 50% 1-2+ pitting edema noted to the LE b/l, however lungs CTAB. Interesting, pt is down 6 1lbs from previous (appears dry weight may be around 212 which she is below). There have been issues with low BPs in the past and would like to avoid to much change in her volume status rapidly given her age. - Pt to go back to prescribed Lasix regimen: 80mg  around 7am and then 40mg  around 1-2pm - Discussed symptoms of orthostatics and reasons to return to clinic. - Faint DP pulses b/l, may benefit from ABIs in the future  - If no improvement with change in Lasix, consider attempting Una boots if appropriate. - No other changes to the patient's regimen.    No orders of the defined types were placed in this encounter.    No orders of the defined types were placed in this encounter.    Joanna Puff PGY-2, [facility]

## 2015-07-14 NOTE — Patient Instructions (Signed)
Please change the way you are taking Lasix. Take 1 pill in the morning around 7am and then take half a tablet around 1 or 2pm. If you develop dizziness or chest pain please come back and see [place]. I will contact Dr. Randolm Idol concerning other options if the swelling in your legs does not improve.

## 2015-07-15 ENCOUNTER — Other Ambulatory Visit: Payer: Self-pay | Admitting: Family Medicine

## 2015-07-16 ENCOUNTER — Encounter: Payer: Self-pay | Admitting: Family Medicine

## 2015-07-16 NOTE — Assessment & Plan Note (Signed)
1-2+ pitting edema noted to the LE b/l, however lungs CTAB. Interesting, pt is down 6 1lbs from previous (appears dry weight may be around 212 which she is below). There have been issues with low BPs in the past and would like to avoid to much change in her volume status rapidly given her age. - Pt to go back to prescribed Lasix regimen: 80mg  around 7am and then 40mg  around 1-2pm - Discussed symptoms of orthostatics and reasons to return to clinic. - Faint DP pulses b/l, may benefit from ABIs in the future  - If no improvement with change in Lasix, consider attempting Una boots if appropriate. - No other changes to the patient's regimen.

## 2015-07-27 ENCOUNTER — Ambulatory Visit (INDEPENDENT_AMBULATORY_CARE_PROVIDER_SITE_OTHER): Payer: PPO | Admitting: Internal Medicine

## 2015-07-27 ENCOUNTER — Ambulatory Visit (INDEPENDENT_AMBULATORY_CARE_PROVIDER_SITE_OTHER): Payer: PPO | Admitting: *Deleted

## 2015-07-27 ENCOUNTER — Encounter: Payer: Self-pay | Admitting: Internal Medicine

## 2015-07-27 VITALS — BP 107/67 | HR 75 | Temp 97.6°F | Wt 214.0 lb

## 2015-07-27 DIAGNOSIS — I4891 Unspecified atrial fibrillation: Secondary | ICD-10-CM | POA: Diagnosis not present

## 2015-07-27 DIAGNOSIS — I480 Paroxysmal atrial fibrillation: Secondary | ICD-10-CM | POA: Diagnosis not present

## 2015-07-27 DIAGNOSIS — I872 Venous insufficiency (chronic) (peripheral): Secondary | ICD-10-CM | POA: Diagnosis not present

## 2015-07-27 DIAGNOSIS — Z7901 Long term (current) use of anticoagulants: Secondary | ICD-10-CM | POA: Diagnosis not present

## 2015-07-27 DIAGNOSIS — I509 Heart failure, unspecified: Secondary | ICD-10-CM

## 2015-07-27 LAB — POCT INR: INR: 1.8

## 2015-07-27 NOTE — Assessment & Plan Note (Signed)
Patient reports taking Lasix as prescribed, but edema and now weeping persist. PE shows 2+ pitting edema to shins bilaterally. There are few small abrasions that seem to be the source of the weeping fluid, but no history of trauma. - Keep both legs wrapped to knees with ACE wraps. Also instructed son on proper way to wrap legs so he can help her.  - Instructed to keep legs elevated below her heart every time she sits down. - Continue taking Lasix 80 mg 7 AM and 40 mg 2 PM - Educated patient about warning signs for infection and instructed her to call the office if she noticed any of these - PRN follow-up for this problem

## 2015-07-27 NOTE — Patient Instructions (Addendum)
Please keep your legs wrapped in ACE bandages as the nurses showed you. Also, it is very important to keep your legs elevated above your heart EVERY time you sit down.   Continue to take 1 Lasix pill in the morning and half a pill around 2 PM. If you develop dizziness or lightheadedness please call the clinic.   If your legs become very red or warm to the touch, or if you start having fevers or chills, please call the clinic so we can check for an infection.   Feel free to call us with any questions.   -Dr. Natale Milch

## 2015-07-27 NOTE — Progress Notes (Signed)
Subjective:    Patient ID: Misty Gray, female    DOB: 30-Dec-1950, 64 y.o.   MRN: [id number]  HPI  Misty Gray is a 64 yo F with extensive past medical history including CHF presenting for leg draining.   Misty Gray reports that since yesterday her lower legs have been draining/weeping, more on the R leg than the left. She came to the clinic about two weeks ago for increased LE swelling. Her Lasix regimen was changed at that visit, and she was instructed to begin 80 mg at 7 AM and 40 mg at 2 PM. She reports that she has been taking her medication this way, and her swelling has improved, but now her legs are weeping. She reports elevating her legs when sitting down sometimes but not always.   She recalls scraping her leg on a door but does not have an abrasion. She denies other trauma.     Review of Systems General: denies fevers, chills Resp: endorses baseline SOB GI: denies abdominal pain CV: denies chest pain MSK: denies leg pain/tenderness, endorses weeping or bilateral LE R>L     Objective:   Physical Exam  Constitutional: She is oriented to person, place, and time.  Pleasant elderly lady in NAD  Cardiovascular: Normal rate and regular rhythm.   No murmur heard. Pulmonary/Chest: Effort normal. No respiratory distress. She has no wheezes.  Minimal crackles on LLL  Abdominal: Soft. Bowel sounds are normal. She exhibits no distension. There is no tenderness.  Musculoskeletal:  2+ pitting edema to shin bilateral LE  Neurological: She is alert and oriented to person, place, and time.  Skin:  Clear fluid weeping 2-3 small abrasions on R mid shin and lateral calf, and from 1 abrasion on L medial calf; legs erythematous below shins bilaterally; multiple varicosities on both feet, L>R; few small abrasions scattered across both lower legs    Physical Exam  Constitutional: She is oriented to person, place, and time.  Pleasant elderly lady in NAD  Cardiovascular: Normal rate and  regular rhythm.   No murmur heard. Pulmonary/Chest: Effort normal. No respiratory distress. She has no wheezes.  Minimal crackles on LLL  Abdominal: Soft. Bowel sounds are normal. She exhibits no distension. There is no tenderness.  Musculoskeletal:  2+ pitting edema to shin bilateral LE  Neurological: She is alert and oriented to person, place, and time.  Skin:  Clear fluid weeping 2-3 small abrasions on R mid shin and lateral calf, and from 1 abrasion on L medial calf; legs erythematous below shins bilaterally; multiple varicosities on both feet, L>R; few small abrasions scattered across both lower legs         Assessment & Plan:   Misty Gray is a 64 yo F with extensive past medical history including CHF with leg edema and draining.   Peripheral venous insufficiency - Patient reports taking Lasix as prescribed, but edema and now weeping persist. PE shows 2+ pitting edema to shins bilaterally. There are few small abrasions that seem to be the source of the weeping fluid, but no history of trauma. - Keep both legs wrapped to knees with ACE wraps. Also instructed son on proper way to wrap legs so he can help her.  - Instructed to keep legs elevated below her heart every time she sits down. - Continue taking Lasix 80 mg 7 AM and 40 mg 2 PM - Educated patient about warning signs for infection and instructed her to call the office if she noticed any  of these - PRN follow-up for this problem

## 2015-07-28 LAB — BASIC METABOLIC PANEL
BUN: 17 mg/dL (ref 7–25)
CO2: 29 mmol/L (ref 20–31)
Calcium: 9.1 mg/dL (ref 8.6–10.4)
Chloride: 99 mmol/L (ref 98–110)
Creat: 0.68 mg/dL (ref 0.50–0.99)
Glucose, Bld: 88 mg/dL (ref 65–99)
POTASSIUM: 4.1 mmol/L (ref 3.5–5.3)
Sodium: 141 mmol/L (ref 135–146)

## 2015-07-31 ENCOUNTER — Encounter: Payer: Self-pay | Admitting: Family Medicine

## 2015-07-31 ENCOUNTER — Ambulatory Visit (INDEPENDENT_AMBULATORY_CARE_PROVIDER_SITE_OTHER): Payer: PPO | Admitting: Family Medicine

## 2015-07-31 VITALS — BP 124/92 | HR 74 | Temp 98.2°F | Wt 216.0 lb

## 2015-07-31 DIAGNOSIS — I872 Venous insufficiency (chronic) (peripheral): Secondary | ICD-10-CM | POA: Diagnosis not present

## 2015-07-31 NOTE — Progress Notes (Signed)
   Subjective:    Patient ID: Misty Gray, female    DOB: [birth year], 64 y.o.   MRN: [id number]  HPI 64 y/o female with PMH systolic CHF and chronic venous insufficiency presents for follow up of leg swelling.   Patient has been seen twice in the past 2 weeks, lasix increased to 80 mg in the AM and 40 mg in the PM, keeping legs elevated, using ACE wraps as tolerated, patient has been applying Clobetasol cream prn itching/irritation  SOB is at baseline, admits to orthopnea that is also at baseline, no chest pain   Review of Systems No fevers/chills    Objective:   Physical Exam Vitals: reviewed Gen: pleasant female, NAD Cardiac: Irregular Irregular rhythym, no murmur Resp: crackles in bases, normal work of breathing Ext: 2+ pitting edema of bilateral legs up to knees, chronic irritation/skin changes over anterior shins, weeping present over anterior right LE, no clear signs of infection, 2 second cap refill in bilateral LE, unable to palpate DP or PT pulses due to edema, no hair growth on feet, varicose veins present bilateral  Reviewed BMP from 8/11 which showed normal renal function       Assessment & Plan:  Please see problem specific assessment and plan.

## 2015-07-31 NOTE — Assessment & Plan Note (Signed)
Swelling improved with lasix/elevation/wraps. Continued weeping anteriorly. -check ABI's to evaluate for PAD -if ABI's normal will apply unna boot to the right LE

## 2015-07-31 NOTE — Patient Instructions (Signed)
It was nice to see you today.  Please schedule and appointment with Dr. Raymondo Band (pharmacist) to check ABI's. (check arterial blood flow).   Continue Lasix 80 mg in the morning and 40 mg in the afternoon. Continue to wrap your legs with ace bandages and keep your legs elevated.   Make an appointment to see Dr. Randolm Idol after you get the ABI's.

## 2015-08-08 ENCOUNTER — Ambulatory Visit (INDEPENDENT_AMBULATORY_CARE_PROVIDER_SITE_OTHER): Payer: PPO | Admitting: *Deleted

## 2015-08-08 ENCOUNTER — Ambulatory Visit (INDEPENDENT_AMBULATORY_CARE_PROVIDER_SITE_OTHER): Payer: PPO | Admitting: Pharmacist

## 2015-08-08 VITALS — BP 122/68 | HR 85

## 2015-08-08 DIAGNOSIS — Z7901 Long term (current) use of anticoagulants: Secondary | ICD-10-CM | POA: Diagnosis not present

## 2015-08-08 DIAGNOSIS — I872 Venous insufficiency (chronic) (peripheral): Secondary | ICD-10-CM

## 2015-08-08 DIAGNOSIS — I4891 Unspecified atrial fibrillation: Secondary | ICD-10-CM | POA: Diagnosis not present

## 2015-08-08 LAB — POCT INR: INR: 1.9

## 2015-08-08 NOTE — Patient Instructions (Signed)
Good to see you today!  The results of your ankle/brachial index test were normal meaning you have normal blood flow to your feet. Try to keep feet elevated above your heart when you lay down for the night and also periodically throughout the day.  Follow up with Dr. Zadie Cleverly on Aug. 28

## 2015-08-08 NOTE — Assessment & Plan Note (Addendum)
Low likelihood of PAD based on ABI of 1.0 in a patient with symptoms of tingling, tightness, and stiffness while standing still or at rest. Pedal pressures on each foot are diminished however, posterior tibial pressures are not. ABI result is normal. Results reviewed and written information provided. Plan was to F/U with Dr. Randolm Idol for Roland Rack boot reevaluation in 1 week following brief visit with Dr. Randolm Idol today. To

## 2015-08-08 NOTE — Progress Notes (Signed)
S:    Patient arrives in good spirits, ambulating without assistance.  She is accompanied to the visit by her son Hessie Diener.  She presents to the clinic for PADABI evaluation.  She reports leg pain since her mid-30s.  She reports a history of "restless-leg syndrome".   Reports pain with walking.  Pain is described as tingling, tightness and stiffness.  Able to stand for up to 30 minutes while singing in choir.   She reports  she sits down after standing due to back and hip pain.   Reports pain starting while at rest or standing still. Denies pain worsening when walking up hill or in a hurry.  Reports pain when walking at an ordinary pace on a level surface.   Reports pain resolves on sitting after 5-10 minutes.  Pain is localized to left hip and left knee.  O:  Lower extremity Physical Exam includes diminished pulses in left dorsalis pedis, absence of limb hair, mild cyanosis, trace edema and thickened brittle nails.)  bilaterally  ABI overall = 1.0 Right Arm 120 mmHg    Left Arm Right ankle posterior tibial 120 mmHg     dorsalis pedis 50 mmHg Left ankle posterior tibial 100 mmHg    dorsalis pedis 70 mmHg   A/P:  Low likelihood of PAD based on ABI of 1.0 in a patient with symptoms of tingling, tightness, and stiffness while standing still or at rest. Pedal pressures on each foot are diminished however, posterior tibial pressures are not. ABI result is normal. Results reviewed and written information provided. Plan was to F/U with Dr. Randolm Idol for Roland Rack boot reevaluation in 1 week following brief visit with Dr. Randolm Idol today. Total time in face-to-face counseling 35 minutes.  Patient seen with Sherle Poe, PharmD Resident.

## 2015-08-09 ENCOUNTER — Ambulatory Visit: Payer: PPO

## 2015-08-10 ENCOUNTER — Encounter: Payer: Self-pay | Admitting: Internal Medicine

## 2015-08-10 ENCOUNTER — Ambulatory Visit (INDEPENDENT_AMBULATORY_CARE_PROVIDER_SITE_OTHER): Payer: PPO | Admitting: *Deleted

## 2015-08-10 DIAGNOSIS — I495 Sick sinus syndrome: Secondary | ICD-10-CM

## 2015-08-10 NOTE — Progress Notes (Signed)
I briefly evaluated the patient with Dr. Raymondo Band and the pharmacy team. Venous stasis appears to be improved. Patient encouraged to continue to elevate her legs. No Unna boot applied today and will follow up with patient next week in office.

## 2015-08-10 NOTE — Progress Notes (Signed)
Remote pacemaker transmission.   

## 2015-08-14 ENCOUNTER — Ambulatory Visit (INDEPENDENT_AMBULATORY_CARE_PROVIDER_SITE_OTHER): Payer: PPO | Admitting: Family Medicine

## 2015-08-14 ENCOUNTER — Ambulatory Visit (INDEPENDENT_AMBULATORY_CARE_PROVIDER_SITE_OTHER): Payer: PPO | Admitting: *Deleted

## 2015-08-14 ENCOUNTER — Encounter: Payer: Self-pay | Admitting: Family Medicine

## 2015-08-14 VITALS — BP 113/66 | HR 107 | Temp 98.0°F | Ht 62.0 in | Wt 208.0 lb

## 2015-08-14 DIAGNOSIS — I480 Paroxysmal atrial fibrillation: Secondary | ICD-10-CM | POA: Diagnosis not present

## 2015-08-14 DIAGNOSIS — I872 Venous insufficiency (chronic) (peripheral): Secondary | ICD-10-CM | POA: Diagnosis not present

## 2015-08-14 DIAGNOSIS — I4891 Unspecified atrial fibrillation: Secondary | ICD-10-CM | POA: Diagnosis not present

## 2015-08-14 DIAGNOSIS — Z7901 Long term (current) use of anticoagulants: Secondary | ICD-10-CM

## 2015-08-14 DIAGNOSIS — I10 Essential (primary) hypertension: Secondary | ICD-10-CM

## 2015-08-14 LAB — CUP PACEART REMOTE DEVICE CHECK
Brady Statistic RV Percent Paced: 77 %
Date Time Interrogation Session: 20160825144219
Lead Channel Impedance Value: 480 Ohm
Lead Channel Sensing Intrinsic Amplitude: 5.8 mV
Lead Channel Setting Pacing Amplitude: 2.5 V
MDC IDC MSMT BATTERY REMAINING LONGEVITY: 83 mo
MDC IDC MSMT BATTERY REMAINING PERCENTAGE: 81 %
MDC IDC MSMT BATTERY VOLTAGE: 2.93 V
MDC IDC MSMT LEADCHNL RV PACING THRESHOLD AMPLITUDE: 1 V
MDC IDC MSMT LEADCHNL RV PACING THRESHOLD PULSEWIDTH: 0.8 ms
MDC IDC SET LEADCHNL RV PACING PULSEWIDTH: 0.8 ms
MDC IDC SET LEADCHNL RV SENSING SENSITIVITY: 1.5 mV
Pulse Gen Model: 2210
Pulse Gen Serial Number: 7254513

## 2015-08-14 LAB — POCT INR: INR: 1.9

## 2015-08-14 NOTE — Assessment & Plan Note (Signed)
Leg swelling continues to gradually improve. No need for Unaboot today -Continue daily leg elevation -Continue current diabetic regimen -Return precautions given

## 2015-08-14 NOTE — Assessment & Plan Note (Signed)
Controlled on current regimen -No changes in therapy today

## 2015-08-14 NOTE — Progress Notes (Signed)
   Subjective:    Patient ID: Misty Gray, female    DOB: 02/09/1951, 64 y.o.   MRN: [id number]  HPI 64 year old female presents for routine follow-up.  Leg swelling - patient states that she has been compliant with leg elevation, she continues to have some drainage that is significantly improved, her swelling has also improved from previous visit, had recent normal ABIs completed in pharmacy clinic  Atrial Fibrillation - currently on Coumadin, digoxin, metoprolol, and spironolactone; denies current chest pain or palpitations  Social-former smoker   Review of Systems  Constitutional: Negative for fever, chills and fatigue.  Respiratory: Negative for cough, choking and shortness of breath.   Cardiovascular: Positive for leg swelling. Negative for chest pain and palpitations.       Objective:   Physical Exam  Vitals: Reviewed Gen.: Pleasant female, no acute distress Cardiac: Irregular rate and rhythm, S1 and S2 present, no murmurs, no heaves or thrills Respiratory: Clear to auscultation bilaterally, normal effort Extremities: 1+ edema bilateral lower extremities to the midshin, 5 mm x 5 mm ulceration over the anterior shin without surrounding erythema, minimal amount of serous drainage present, varicose veins present in bilateral lower extremities  ABIs normal in pharmacy clinic    Assessment & Plan:  Please see problem specific assessment and plan.

## 2015-08-14 NOTE — Patient Instructions (Signed)
It was nice to see you today.  Please continue to elevate your legs.

## 2015-08-14 NOTE — Progress Notes (Signed)
Patient ID: Misty Gray, female   DOB: [birth year], 64 y.o.   MRN: [id number] Reviewed: Agree with Dr. Macky Lower documentation and management.

## 2015-08-14 NOTE — Assessment & Plan Note (Addendum)
Currently rate controlled. INR mildly decreased today 1.9. -Continue current regimen of Coumadin, Toprol, Aldactone, Lasix, and digoxin -Check digoxin level today -See lab note for Coumadin dosing

## 2015-08-15 ENCOUNTER — Other Ambulatory Visit: Payer: Self-pay | Admitting: Family Medicine

## 2015-08-15 ENCOUNTER — Encounter: Payer: Self-pay | Admitting: Family Medicine

## 2015-08-15 LAB — DIGOXIN LEVEL: Digoxin Level: 1 ug/L (ref 0.8–2.0)

## 2015-08-18 ENCOUNTER — Encounter: Payer: Self-pay | Admitting: Cardiology

## 2015-08-24 ENCOUNTER — Ambulatory Visit: Payer: PPO

## 2015-08-24 ENCOUNTER — Ambulatory Visit (INDEPENDENT_AMBULATORY_CARE_PROVIDER_SITE_OTHER): Payer: PPO | Admitting: *Deleted

## 2015-08-24 DIAGNOSIS — I4891 Unspecified atrial fibrillation: Secondary | ICD-10-CM

## 2015-08-24 DIAGNOSIS — Z7901 Long term (current) use of anticoagulants: Secondary | ICD-10-CM | POA: Diagnosis not present

## 2015-08-24 LAB — POCT INR: INR: 2.3

## 2015-09-04 ENCOUNTER — Ambulatory Visit (INDEPENDENT_AMBULATORY_CARE_PROVIDER_SITE_OTHER): Payer: PPO | Admitting: Family Medicine

## 2015-09-04 ENCOUNTER — Encounter: Payer: Self-pay | Admitting: Family Medicine

## 2015-09-04 VITALS — BP 127/80 | HR 122 | Temp 97.7°F | Ht 62.0 in | Wt 213.4 lb

## 2015-09-04 DIAGNOSIS — I872 Venous insufficiency (chronic) (peripheral): Secondary | ICD-10-CM | POA: Diagnosis not present

## 2015-09-04 DIAGNOSIS — L03116 Cellulitis of left lower limb: Secondary | ICD-10-CM

## 2015-09-04 DIAGNOSIS — N63 Unspecified lump in breast: Secondary | ICD-10-CM

## 2015-09-04 DIAGNOSIS — I5022 Chronic systolic (congestive) heart failure: Secondary | ICD-10-CM | POA: Diagnosis not present

## 2015-09-04 DIAGNOSIS — N6320 Unspecified lump in the left breast, unspecified quadrant: Secondary | ICD-10-CM

## 2015-09-04 DIAGNOSIS — N393 Stress incontinence (female) (male): Secondary | ICD-10-CM

## 2015-09-04 DIAGNOSIS — I482 Chronic atrial fibrillation, unspecified: Secondary | ICD-10-CM

## 2015-09-04 DIAGNOSIS — K219 Gastro-esophageal reflux disease without esophagitis: Secondary | ICD-10-CM

## 2015-09-04 DIAGNOSIS — I519 Heart disease, unspecified: Secondary | ICD-10-CM | POA: Diagnosis not present

## 2015-09-04 DIAGNOSIS — L03115 Cellulitis of right lower limb: Secondary | ICD-10-CM | POA: Insufficient documentation

## 2015-09-04 MED ORDER — DOXYCYCLINE HYCLATE 100 MG PO CAPS: 100.0000 mg | ORAL_CAPSULE | Freq: Two times a day (BID) | ORAL | Status: DC

## 2015-09-04 NOTE — Progress Notes (Signed)
   Subjective:   Misty Gray is a 64 y.o. female with a history of HFrEF (30-35%), Afib, SA node dysfunction, HTN, Venous insufficiency   Here for  Chief Complaint  Patient presents with  . feet and legs swelling   #LLE redness: Started Saturday night, slightly better sun AM but worsened on Sunday PM. Redness extending up leg. Put hydrocortisone cream on it which did not help. Reports she elevated it which helped minimally. She is unable to lace up shoes bilateral 2/2 to pain. Unable to walk 2/2 to pain. Reports she she had this before. Using Halobetasol prn to help with sores Took 1/2 tab of lasix Sat and Sun ( this is 1/3 of her prescribed dose) Scale broken- not weighing herself Denies CP, SOB. Reports chronic breathlessness that is unchanged from baseline.   #Afib: Takes 5mg  in AM and 1/2 (2.5) in the PM. TDD 7.5mg   #HFrEF: she is supposed to take 80mg  in AM and 40mg  in PM but has not taken in several days (Sat/Sun)  Health Maintenance Due  Topic Date Due  . Hepatitis C Screening  02-09-1951  . COLONOSCOPY  03/17/2015  . INFLUENZA VACCINE  07/17/2015  . PNA vac Low Risk Adult (2 of 2 - PCV13) 08/30/2015    Review of Systems:   Per HPI. All other systems reviewed and are negative expect as in HPI   PMH, PSH, Medications, Allergies, SocialHx and FHx reviewed and updated in EMR- marked as reviewed 09/04/2015   Objective:  BP 127/80 mmHg  Pulse 122  Temp(Src) 97.7 F (36.5 C) (Oral)  Ht 5\' 2"  (1.575 m)  Wt 213 lb 6.4 oz (96.798 kg)  BMI 39.02 kg/m2  Wt Readings from Last 3 Encounters:  09/04/15 213 lb 6.4 oz (96.798 kg)  08/14/15 208 lb (94.348 kg)  07/31/15 216 lb (97.977 kg)    Gen:  64 y.o. female in NAD. Talkative, Speaking in full sentences. Good eye contact HEENT: NCAT, MMM, EOMI, PERRL, anicteric sclerae.  CV: Soft distant heart sounds. RRR, no MRG, no JVD Resp: Normal WOB. Non-labored, CTAB, no wheezes noted GI: Soft, NTND, BS present, no guarding or  organomegaly Ext: cool with dimished pulses. 2+ pitting edema to bilateral knees, L>R swelilng.  LLE is erythematous with warm in the distal portion. Painful to touch.  MSK: Intact gait. Full ROM Neuro: Alert and oriented, speech normal Pysch: Normal mood and affect.        Assessment:     Misty Gray is a 64 y.o. female with HFrEF (30-35%), Afib, SA node dysfunction, HTN, Venous insufficiency here for bilateral leg swelling with LLE erthyema, calor and pain likely cellulitis.     Plan:   #LLE Cellulitis:  likely related to acute exacerbation of CHF with increased peripheral edema.  - Doxycycline 100 BID x 7 days  - Discussed importance of elevation - Marked area and discussed returning to care for extension of redness past line - RTC in 2 days for wound check -Consider involving home health wound care.   #HF exacerbation in the setting of HFrEF -Patient will take prescribed dose of lasxi 80mg  qAM and 40qPM  #Afib: stable. no changes today #Chronic anticoagulation: evaluated coumadin interactions with typical and treatment modalities for cellulitis.   Federico Flake, MD, ABFM 09/04/2015  2:22 PM

## 2015-09-04 NOTE — Patient Instructions (Signed)
Please take your lasix 1 tab in AM and 1/2 tab in the PM. Please take this consistently for the next 4days at least Take your new antibiotic Follow up in 2 days  Cellulitis Cellulitis is an infection of the skin and the tissue beneath it. The infected area is usually red and tender. Cellulitis occurs most often in the arms and lower legs.  CAUSES  Cellulitis is caused by bacteria that enter the skin through cracks or cuts in the skin. The most common types of bacteria that cause cellulitis are staphylococci and streptococci. SIGNS AND SYMPTOMS   Redness and warmth.  Swelling.  Tenderness or pain.  Fever. DIAGNOSIS  Your health care provider can usually determine what is wrong based on a physical exam. Blood tests may also be done. TREATMENT  Treatment usually involves taking an antibiotic medicine. HOME CARE INSTRUCTIONS   Take your antibiotic medicine as directed by your health care provider. Finish the antibiotic even if you start to feel better.  Keep the infected arm or leg elevated to reduce swelling.  Apply a warm cloth to the affected area up to 4 times per day to relieve pain.  Take medicines only as directed by your health care provider.  Keep all follow-up visits as directed by your health care provider. SEEK MEDICAL CARE IF:   You notice red streaks coming from the infected area.  Your red area gets larger or turns dark in color.  Your bone or joint underneath the infected area becomes painful after the skin has healed.  Your infection returns in the same area or another area.  You notice a swollen bump in the infected area.  You develop new symptoms.  You have a fever. SEEK IMMEDIATE MEDICAL CARE IF:   You feel very sleepy.  You develop vomiting or diarrhea.  You have a general ill feeling (malaise) with muscle aches and pains. MAKE SURE YOU:   Understand these instructions.  Will watch your condition.  Will get help right away if you are not  doing well or get worse. Document Released: 09/11/2005 Document Revised: 04/18/2014 Document Reviewed: 02/17/2012 [facility] Patient Information 2015 [place], [place]. This information is not intended to replace advice given to you by your health care provider. Make sure you discuss any questions you have with your health care provider.

## 2015-09-06 ENCOUNTER — Encounter (HOSPITAL_COMMUNITY): Payer: Self-pay | Admitting: General Practice

## 2015-09-06 ENCOUNTER — Encounter: Payer: Self-pay | Admitting: Family Medicine

## 2015-09-06 ENCOUNTER — Inpatient Hospital Stay (HOSPITAL_COMMUNITY)
Admission: AD | Admit: 2015-09-06 | Discharge: 2015-09-10 | DRG: 603 | Disposition: A | Discharge status: Home / Self Care | Payer: PPO | Source: Ambulatory Visit | Attending: Family Medicine | Admitting: Family Medicine

## 2015-09-06 ENCOUNTER — Ambulatory Visit (INDEPENDENT_AMBULATORY_CARE_PROVIDER_SITE_OTHER): Payer: PPO | Admitting: Family Medicine

## 2015-09-06 VITALS — BP 114/90 | HR 69 | Temp 98.1°F | Wt 209.0 lb

## 2015-09-06 DIAGNOSIS — L03116 Cellulitis of left lower limb: Secondary | ICD-10-CM

## 2015-09-06 DIAGNOSIS — Z87891 Personal history of nicotine dependence: Secondary | ICD-10-CM

## 2015-09-06 DIAGNOSIS — Z23 Encounter for immunization: Secondary | ICD-10-CM | POA: Diagnosis not present

## 2015-09-06 DIAGNOSIS — L03115 Cellulitis of right lower limb: Secondary | ICD-10-CM | POA: Diagnosis present

## 2015-09-06 DIAGNOSIS — I5022 Chronic systolic (congestive) heart failure: Secondary | ICD-10-CM | POA: Diagnosis present

## 2015-09-06 DIAGNOSIS — I878 Other specified disorders of veins: Secondary | ICD-10-CM | POA: Diagnosis not present

## 2015-09-06 DIAGNOSIS — I495 Sick sinus syndrome: Secondary | ICD-10-CM | POA: Diagnosis present

## 2015-09-06 DIAGNOSIS — Z8249 Family history of ischemic heart disease and other diseases of the circulatory system: Secondary | ICD-10-CM | POA: Diagnosis not present

## 2015-09-06 DIAGNOSIS — I4891 Unspecified atrial fibrillation: Secondary | ICD-10-CM | POA: Diagnosis present

## 2015-09-06 DIAGNOSIS — E559 Vitamin D deficiency, unspecified: Secondary | ICD-10-CM | POA: Diagnosis present

## 2015-09-06 DIAGNOSIS — G4733 Obstructive sleep apnea (adult) (pediatric): Secondary | ICD-10-CM | POA: Diagnosis present

## 2015-09-06 DIAGNOSIS — L0390 Cellulitis, unspecified: Secondary | ICD-10-CM | POA: Diagnosis present

## 2015-09-06 DIAGNOSIS — Z888 Allergy status to other drugs, medicaments and biological substances status: Secondary | ICD-10-CM | POA: Diagnosis not present

## 2015-09-06 DIAGNOSIS — Z79899 Other long term (current) drug therapy: Secondary | ICD-10-CM

## 2015-09-06 DIAGNOSIS — N63 Unspecified lump in breast: Secondary | ICD-10-CM | POA: Diagnosis present

## 2015-09-06 DIAGNOSIS — I428 Other cardiomyopathies: Secondary | ICD-10-CM | POA: Diagnosis present

## 2015-09-06 DIAGNOSIS — I872 Venous insufficiency (chronic) (peripheral): Secondary | ICD-10-CM | POA: Diagnosis present

## 2015-09-06 DIAGNOSIS — G40409 Other generalized epilepsy and epileptic syndromes, not intractable, without status epilepticus: Secondary | ICD-10-CM | POA: Diagnosis present

## 2015-09-06 DIAGNOSIS — D649 Anemia, unspecified: Secondary | ICD-10-CM | POA: Diagnosis present

## 2015-09-06 DIAGNOSIS — Z950 Presence of cardiac pacemaker: Secondary | ICD-10-CM | POA: Diagnosis not present

## 2015-09-06 DIAGNOSIS — E780 Pure hypercholesterolemia: Secondary | ICD-10-CM | POA: Diagnosis present

## 2015-09-06 DIAGNOSIS — M8580 Other specified disorders of bone density and structure, unspecified site: Secondary | ICD-10-CM | POA: Diagnosis present

## 2015-09-06 DIAGNOSIS — K219 Gastro-esophageal reflux disease without esophagitis: Secondary | ICD-10-CM | POA: Diagnosis present

## 2015-09-06 DIAGNOSIS — I10 Essential (primary) hypertension: Secondary | ICD-10-CM | POA: Diagnosis present

## 2015-09-06 DIAGNOSIS — J309 Allergic rhinitis, unspecified: Secondary | ICD-10-CM | POA: Diagnosis present

## 2015-09-06 DIAGNOSIS — M1389 Other specified arthritis, multiple sites: Secondary | ICD-10-CM | POA: Diagnosis present

## 2015-09-06 DIAGNOSIS — Z7901 Long term (current) use of anticoagulants: Secondary | ICD-10-CM

## 2015-09-06 DIAGNOSIS — Z880 Allergy status to penicillin: Secondary | ICD-10-CM | POA: Diagnosis not present

## 2015-09-06 HISTORY — DX: Dependence on other enabling machines and devices: Z99.89

## 2015-09-06 HISTORY — DX: Personal history of other medical treatment: Z92.89

## 2015-09-06 HISTORY — DX: Dependence on supplemental oxygen: Z99.81

## 2015-09-06 HISTORY — DX: Other generalized epilepsy and epileptic syndromes, not intractable, without status epilepticus: G40.409

## 2015-09-06 HISTORY — DX: Obstructive sleep apnea (adult) (pediatric): G47.33

## 2015-09-06 HISTORY — DX: Migraine, unspecified, not intractable, without status migrainosus: G43.909

## 2015-09-06 HISTORY — DX: Family history of other specified conditions: Z84.89

## 2015-09-06 LAB — CBC
HEMATOCRIT: 40.9 % (ref 36.0–46.0)
HEMOGLOBIN: 13.6 g/dL (ref 12.0–15.0)
MCH: 31.5 pg (ref 26.0–34.0)
MCHC: 33.3 g/dL (ref 30.0–36.0)
MCV: 94.7 fL (ref 78.0–100.0)
Platelets: 147 10*3/uL — ABNORMAL LOW (ref 150–400)
RBC: 4.32 MIL/uL (ref 3.87–5.11)
RDW: 14.9 % (ref 11.5–15.5)
WBC: 5.8 10*3/uL (ref 4.0–10.5)

## 2015-09-06 LAB — PROTIME-INR
INR: 2.4 — ABNORMAL HIGH (ref 0.00–1.49)
Prothrombin Time: 25.9 seconds — ABNORMAL HIGH (ref 11.6–15.2)

## 2015-09-06 LAB — COMPREHENSIVE METABOLIC PANEL
ALBUMIN: 3.2 g/dL — AB (ref 3.5–5.0)
ALT: 18 U/L (ref 14–54)
AST: 26 U/L (ref 15–41)
Alkaline Phosphatase: 101 U/L (ref 38–126)
Anion gap: 9 (ref 5–15)
BUN: 18 mg/dL (ref 6–20)
CHLORIDE: 101 mmol/L (ref 101–111)
CO2: 27 mmol/L (ref 22–32)
CREATININE: 0.82 mg/dL (ref 0.44–1.00)
Calcium: 9.4 mg/dL (ref 8.9–10.3)
GFR calc Af Amer: >60 mL/min (ref >60)
GFR calc non Af Amer: >60 mL/min (ref >60)
GLUCOSE: 97 mg/dL (ref 65–99)
POTASSIUM: 3.5 mmol/L (ref 3.5–5.1)
Sodium: 137 mmol/L (ref 135–145)
Total Bilirubin: 0.8 mg/dL (ref 0.3–1.2)
Total Protein: 7.5 g/dL (ref 6.5–8.1)

## 2015-09-06 MED ORDER — INFLUENZA VAC SPLIT QUAD 0.5 ML IM SUSY
0.5000 mL | PREFILLED_SYRINGE | INTRAMUSCULAR | Status: AC
  Administered 2015-09-07: 0.5 mL via INTRAMUSCULAR
  Filled 2015-09-06: qty 0.5

## 2015-09-06 MED ORDER — ACETAMINOPHEN 325 MG PO TABS
650.0000 mg | ORAL_TABLET | Freq: Four times a day (QID) | ORAL | Status: DC | PRN
Start: 2015-09-06 — End: 2015-09-10
  Administered 2015-09-09: 650 mg via ORAL
  Filled 2015-09-06 (×2): qty 2

## 2015-09-06 MED ORDER — ENALAPRIL MALEATE 5 MG PO TABS
5.0000 mg | ORAL_TABLET | Freq: Every day | ORAL | Status: DC
  Administered 2015-09-07 – 2015-09-10 (×2): 5 mg via ORAL
  Filled 2015-09-06 (×4): qty 1

## 2015-09-06 MED ORDER — PANTOPRAZOLE SODIUM 40 MG PO TBEC
40.0000 mg | DELAYED_RELEASE_TABLET | Freq: Every day | ORAL | Status: DC
  Administered 2015-09-07 – 2015-09-10 (×4): 40 mg via ORAL
  Filled 2015-09-06 (×4): qty 1

## 2015-09-06 MED ORDER — WARFARIN - PHARMACIST DOSING INPATIENT
Freq: Every day | Status: DC
  Administered 2015-09-09: 18:00:00

## 2015-09-06 MED ORDER — SODIUM CHLORIDE 0.9 % IJ SOLN: 3.0000 mL | INTRAMUSCULAR | Status: DC | PRN

## 2015-09-06 MED ORDER — ENOXAPARIN SODIUM 40 MG/0.4ML ~~LOC~~ SOLN
40.0000 mg | SUBCUTANEOUS | Status: DC
  Filled 2015-09-06: qty 0.4

## 2015-09-06 MED ORDER — FUROSEMIDE 40 MG PO TABS
40.0000 mg | ORAL_TABLET | Freq: Every day | ORAL | Status: DC
  Administered 2015-09-06 – 2015-09-09 (×4): 40 mg via ORAL
  Filled 2015-09-06 (×4): qty 1

## 2015-09-06 MED ORDER — PHENOBARBITAL 32.4 MG PO TABS
64.8000 mg | ORAL_TABLET | Freq: Two times a day (BID) | ORAL | Status: DC
  Administered 2015-09-06 – 2015-09-10 (×8): 64.8 mg via ORAL
  Filled 2015-09-06 (×8): qty 2

## 2015-09-06 MED ORDER — SODIUM CHLORIDE 0.9 % IJ SOLN
3.0000 mL | Freq: Two times a day (BID) | INTRAMUSCULAR | Status: DC
  Administered 2015-09-06 – 2015-09-10 (×6): 3 mL via INTRAVENOUS

## 2015-09-06 MED ORDER — DIGOXIN 125 MCG PO TABS
0.1250 mg | ORAL_TABLET | Freq: Every day | ORAL | Status: DC
  Administered 2015-09-07 – 2015-09-10 (×4): 0.125 mg via ORAL
  Filled 2015-09-06 (×4): qty 1

## 2015-09-06 MED ORDER — SODIUM CHLORIDE 0.9 % IV SOLN: 250.0000 mL | INTRAVENOUS | Status: DC | PRN

## 2015-09-06 MED ORDER — SPIRONOLACTONE 25 MG PO TABS
25.0000 mg | ORAL_TABLET | Freq: Every day | ORAL | Status: DC
  Administered 2015-09-07 – 2015-09-10 (×4): 25 mg via ORAL
  Filled 2015-09-06 (×4): qty 1

## 2015-09-06 MED ORDER — WARFARIN SODIUM 2.5 MG PO TABS
12.5000 mg | ORAL_TABLET | Freq: Once | ORAL | Status: AC
  Administered 2015-09-06: 12.5 mg via ORAL
  Filled 2015-09-06 (×2): qty 1

## 2015-09-06 MED ORDER — SODIUM CHLORIDE 0.9 % IJ SOLN: 3.0000 mL | Freq: Two times a day (BID) | INTRAMUSCULAR | Status: DC

## 2015-09-06 MED ORDER — FUROSEMIDE 80 MG PO TABS
80.0000 mg | ORAL_TABLET | Freq: Every morning | ORAL | Status: DC
  Administered 2015-09-07 – 2015-09-10 (×4): 80 mg via ORAL
  Filled 2015-09-06 (×4): qty 1

## 2015-09-06 MED ORDER — CLINDAMYCIN PHOSPHATE 600 MG/50ML IV SOLN
600.0000 mg | Freq: Three times a day (TID) | INTRAVENOUS | Status: DC
  Administered 2015-09-06 – 2015-09-09 (×9): 600 mg via INTRAVENOUS
  Filled 2015-09-06 (×10): qty 50

## 2015-09-06 MED ORDER — ACETAMINOPHEN 650 MG RE SUPP: 650.0000 mg | Freq: Four times a day (QID) | RECTAL | Status: DC | PRN

## 2015-09-06 MED ORDER — METOPROLOL SUCCINATE ER 25 MG PO TB24
25.0000 mg | ORAL_TABLET | Freq: Every day | ORAL | Status: DC
  Administered 2015-09-07 – 2015-09-10 (×4): 25 mg via ORAL
  Filled 2015-09-06 (×4): qty 1

## 2015-09-06 NOTE — Progress Notes (Signed)
  HPI:  Pt presents for a same day appointment to discuss cellulitis.  Seen two days ago for cellulitis of RLE, started on doxycycline. Has been compliant with twice daily dosing. Has not noticed any improvement while on this medicine. Actually might be getting worse. Denies fevers, decreased PO intake, chest pain. Has chronic shortness of breath for which she is on home O2 at night, but this is not worsened. Having trouble walking due to pain in the leg.   ROS: See HPI  PMFSH: history of allergic rhinitis, sleep apnea, afib on coumadin, chronic systolic CHF, GERD, hypertension, SA node dysfunction, vit D insufficiency, venous insufficiency  PHYSICAL EXAM: BP 114/90 mmHg  Pulse 69  Temp(Src) 98.1 F (36.7 C) (Oral)  Wt 209 lb (94.802 kg) Gen: NAD, pleasant, cooperative HEENT: normocephalic, atraumatic. moist mucous membranes. Heart: regular rate and rhythm no murmur Lungs: clear to auscultation bilaterally, NWOB Abdomen: soft, NTTP Neuro: grossly nonfocal, speech normal Ext: LLE with erythema on anterior surface of shin, from just above ankle up to slightly above line drawn during 9/19 visit. Compared to pictures documented in 9/19 visit, more erythematous and extended slightly beyond demarcated line. Area is warm and tender to touch. Some erythema wraps around to back of leg distally, but spares most of calf. Pain with passive dorsiflexion of ankle. Foot has no erythema or significant swelling. 2+ DP pulse on L. Chronic venous stasis changes bilaterally present. Able to bear weight on both legs. Brisk capillary refill.  ASSESSMENT/PLAN:  1. LLE cellulitis - has now failed outpatient therapy, with clinically worsening cellulitis since starting doxycycline. Antibiotic options are limited by allergy profile (allergic to PCN's) and being on coumadin. Discussed with patient and family that admission to hospital for IV antibiotics is warranted. They are agreeable to this plan. As no med-surg  beds were available at time of clinic appointment and patient is systemically well and would prefer to wait at home, we allowed her to go home until a bed became available. Discussed warning signs which should prompt immediate visit to ER if any worsening this afternoon. She will be called this afternoon with her bed assignment and will then go to the hospital for direct admission. Inpatient team updated, they will see patient once she arrives at the hospital later today. Recommend obtaining labs (CBC, CMET, INR) and starting IV clindamycin, then monitoring for response. If INR is subtherapeutic, would obtain doppler of LLE to eval for DVT, although low suspicion for DVT at this time as patient is already on anticoagulation, clinically more consistent with cellulitis.  [place] J. Pollie Meyer, MD [facility]

## 2015-09-06 NOTE — Patient Instructions (Signed)
The hospital will call you this afternoon as soon as a bed is available If any worsening, fevers, nausea/vomiting, or other issues this afternoon go to ER immediately  Be well, Dr. Pollie Meyer

## 2015-09-06 NOTE — H&P (Signed)
Family Medicine Teaching [facility] Admission History and Physical Service Pager: [phone]  [name] Medical record number: [id number] Date of birth: 1951-08-22 Age: 64 y.o. Gender: female  Primary Care Provider: Uvaldo Rising, MD Consultants: None Code Status: FULL  Chief Complaint: Cellulitis  Assessment and Plan: Misty Gray is a 64 y.o. female presenting with cellulitis that failed outpatient management. PMH is significant for HTN, HFrEF (EF 30-35%), Afib, venous insufficiency, seizure disorder, OSA.  1. Cellulitis of LLE: Failed outpatient PO doxycycline. Has a history of venous insufficiency. No nidus of infection noted on exam. No h/o DM therefore I do not think we need to cover for Pseudomonas currently.  - continue to monitor on telemetry - IV Clindamycin 600mg  tid - If INR is subtherapeutic, will get doppler U/S of the LLE to rule out thrombosis, given her significant edema - Will get blood cultures to rule out bacteremia - Zofran PRN for nausea - Tylenol PRN for pain - am CBC and CMET  2. Atrial Fibrillation: HR 62 on admission, not currently in Afib CHADVASC score 5.  - Coumadin per pharmacy - Continue home meds: Digoxin 0.125mg , Metoprolol succinate 25mg  qd - Will get PT/INR now and again in the am.  - Cardiac monitoring  3. HFrEF: Last ECHO 12/2013: EF 30-35%, moderate hypokinesis, LA and RA moderately dilated, PA peak pressure . Does not appear to be volume overloaded. - Continue home meds: Enalapril 5mg  qd, Digoxin 0.125mg  qd, Lasix 80mg  qam and 40mg  qhs, Spironolactone 25mg  qd - Daily weights -Strict I&Os  4. Seizure Disorder: Pt states she has not had a seizure in 1-2 years. - Continue home med: Phenobarbital 64.8mg  bid - Seizure precautions  5. OSA - CPAP at night  6. GERD - Protonix 40mg  qd   FEN/GI: Heart Healthy diet, saline lock IV Prophylaxis: Coumadin for Afib  Disposition: Direct admit to [facility] Medicine  Teaching Service, attending Dr. Lum Babe.  History of Present Illness:  Misty Gray is a 64 y.o. female presenting with cellulitis of the LLE that started 5 days ago. She was seen by her PCP on 9/19 and was prescribed Doxycyline bid, which she started taking that evening. She has taken it as prescribed. She notes the redness and swelling has not improved. She states was unable to walk this morning due to L foot swelling and pain. She notes that she had a  "pocket of fluid" around the medial malleolus that has improved. She denies any insect bites to the LLE, but states that she hit her leg on the doorstop approximately 2 weeks ago. She had a "sore" in that area after the incident, but she put hydrogen peroxide on it and it has gone away.   Patient denies chills and fevers. No N/V, abdominal pain, chest pain, change in baseline SOB, or change in appetite.  She was seen in clinic for a follow up appt and was a direct admit due to concerns for failure of outpatient management.   Review Of Systems: Per HPI with the following additions: none Otherwise 12 point review of systems was performed and was unremarkable.  Patient Active Problem List   Diagnosis Date Noted  . Cellulitis of leg, left 09/04/2015  . Preventative health care 10/05/2014  . Breast mass, left 10/05/2014  . Osteopenia 09/15/2014  . Hypertension 01/04/2014  . Chronic systolic dysfunction of left ventricle 05/05/2013  . At high risk for falls 02/11/2013  . Vitamin D deficiency 07/15/2012  . Atrial fibrillation  06/14/2012  . Sinoatrial node dysfunction 05/08/2011  . ALLERGIC RHINITIS 05/31/2008  . Morbid obesity 02/12/2007  . Venous (peripheral) insufficiency 02/12/2007  . GASTROESOPHAGEAL REFLUX, NO ESOPHAGITIS 02/12/2007  . Female stress incontinence 02/12/2007  . Seizure disorder 02/12/2007  . APNEA, SLEEP 02/12/2007   Past Medical History: Past Medical History  Diagnosis Date  . Abnormal CT of the head 12/16/1984    R  parietal atrophy  . Nonischemic cardiomyopathy     EF has normalized-repeat Pending   . Hypertension   . GERD (gastroesophageal reflux disease)   . Unspecified venous (peripheral) insufficiency   . Diaphragmatic hernia without mention of obstruction or gangrene   . Female stress incontinence   . Allergic rhinitis, cause unspecified   . Morbid obesity   . Lichenification and lichen simplex chronicus   . Pacemaker  st judes   . Heart murmur   . CHF (congestive heart failure)   . H/O hiatal hernia     two removed  . Anemia   . High cholesterol   . Pneumonia 2004; 2011; 11/2011  . Sleep apnea, obstructive     CPAP  . Exertional dyspnea 06/22/12  . Migraine, unspecified, without mention of intractable migraine without mention of status migrainosus     "when I was a teenager"  . Seizures     "can hear you talking but sounds like you are in big tunnel; have them often if not taking RX; last one was 06/17/12" (06/22/12)  . Stroke 1988    "mouth drawed real bad on my left side"  . Sick sinus syndrome     WITH PRIOR DDD PACEMAKER IMPLANTATION  . Atrial fibrillation   . Syncope and collapse 06/22/12    "hit forehead and left knee"; denies loss of consciousness  . Arthritis     "hands and knees"  . Influenza A 01/11/2014   Past Surgical History: Past Surgical History  Procedure Laterality Date  . Leg skin lesion  biopsy / excision  05/16/2001    Lichen Planus  . Hernia repair  ? 1988; 04/15/1998    ventral  . Insert / replace / remove pacemaker  12/16/1990    DDD EF 30%;  Marland Kitchen Insert / replace / remove pacemaker  07/25/2003    replaced by BB, leads are both IS1 and from 1992  . Insert / replace / remove pacemaker  05/21/2011    JA; generator change   Social History: Social History  Substance Use Topics  . Smoking status: Former Smoker -- 1.00 packs/day    Types: Cigarettes    Quit date: 03/16/1969  . Smokeless tobacco: Never Used  . Alcohol Use: No   Additional social history: Lives with  husband, son, and grandchildren. Ambulating okay without assistive devices.  Please also refer to relevant sections of EMR.  Family History: Family History  Problem Relation Age of Onset  . Stroke Father     died from CAD?  Marland Kitchen Cancer Mother     melanoma  . Diabetes Son     Type 1  . Sleep apnea Son   . Cancer Sister     cervical  . Hypertension Sister   . Hypertension Brother   . Rheum arthritis Daughter     Also PGM, PGGM   Allergies and Medications: Allergies  Allergen Reactions  . Aspirin Hives and Rash  . Penicillins Rash and Other (See Comments)    Amoxicillin also   No current facility-administered medications on file prior to encounter.  Current Outpatient Prescriptions on File Prior to Encounter  Medication Sig Dispense Refill  . [employer] D3 1000 UNITS capsule TAKE ONE CAPSULE BY MOUTH EVERY MORNING 120 capsule 0  . digoxin (LANOXIN) 0.125 MG tablet TAKE 1 TABLET (125 MCG TOTAL) BY MOUTH DAILY. 90 tablet 1  . doxycycline (VIBRAMYCIN) 100 MG capsule Take 1 capsule (100 mg total) by mouth 2 (two) times daily. 14 capsule 0  . enalapril (VASOTEC) 5 MG tablet TAKE 1 TABLET BY MOUTH DAILY 90 tablet 1  . fluticasone (FLONASE) 50 MCG/ACT nasal spray Place 2 sprays into both nostrils daily as needed. For congestion. 16 g 1  . furosemide (LASIX) 80 MG tablet TAKE ONE TABLET IN THE AM AND 1/2 TABLET IN THE PM 60 tablet 1  . halobetasol (ULTRAVATE) 0.05 % cream Apply 1 application topically 2 (two) times daily. Use on legs    . metoprolol succinate (TOPROL-XL) 25 MG 24 hr tablet TAKE 1 TABLET EVERY DAY 90 tablet 1  . omeprazole (PRILOSEC) 20 MG capsule TAKE 1 CAPSULE (20 MG TOTAL) BY MOUTH DAILY. 90 capsule 1  . PHENobarbital (LUMINAL) 32.4 MG tablet GIVE 2 TABLETS BY MOUTH EVERY MORNING AND 2 AT BEDTIME 120 tablet 5  . spironolactone (ALDACTONE) 25 MG tablet TAKE 1 TABLET (25 MG TOTAL) BY MOUTH DAILY. 90 tablet 1  . warfarin (COUMADIN) 5 MG tablet TAKE DAILY AS DIRECTED. 75 tablet 2     Objective: BP 102/64 mmHg  Pulse 62  Temp(Src) 98 F (36.7 C) (Oral)  Resp 17  SpO2 99% Exam: General: Well-appearing female, laying in bed, pleasant and conversational. HEENT: [place]/AT, EOMI, MMM Neck: Supple Cardiovascular: Distant heart sounds, RRR, no m/r/g Respiratory: CTAB, normal work of breathing, no wheezes or crackles Abdomen: Obese, +BS, soft, non-tender, well-healed vertical scar present MSK: Normal strength, pitting edema bilaterally that extends almost to the knees, more prominent on the L over the regions of erythema  Skin: Erythema present circumferentially in the LLE, LLE is tender to palpation, RLE with small area of erythema, LLE is warm compared to RLE, no open wounds present, see pictures below:      Neuro: Awake, alert, oriented, CN 2-12 grossly intact Psych: Appropriate mood and affect  Labs and Imaging: CBC BMET  No results for input(s): WBC, HGB, HCT, PLT in the last 168 hours. No results for input(s): NA, K, CL, CO2, BUN, CREATININE, GLUCOSE, CALCIUM in the last 168 hours.    Campbell Stall, MD 09/06/2015, 5:14 PM PGY-1, [facility] FPTS Intern pager: [phone], text pages welcome  Upper Level Addendum:  I have seen and evaluated this patient along with Dr. Nancy Marus and reviewed the above note, making necessary revisions in purple.   Joanna Puff, MD [facility] Resident, PGY-2

## 2015-09-06 NOTE — Progress Notes (Signed)
Family medicine paged of patient's arrival to unit.  Leanna Battles, RN.

## 2015-09-06 NOTE — Progress Notes (Addendum)
Admission note:   Arrival Method: Direct admit from MD's office. Mental Status: A&Ox4. Telemetry: Waiting on orders from MD.  Skin: Ecchymotic area on right breast, left buttock, bilateral legs. Cellulitis on BLE, with the left lower leg being MUCH worse. Patient states the right leg is almost healed up.  Tubes: N/A. IV: Waiting on IV team. Pain: 5/10 ache in left lower leg.  Family: Son at bedside. Living Situation: From home with family. Safety Measures: Call bell within reach. Instructed to use call bell for assistance to bathroom. 6E Orientation: Oriented to unit and surroundings.  Leanna Battles, RN.  Addendum - Patient placed on telemetry, box #27 at 1814.

## 2015-09-06 NOTE — Progress Notes (Signed)
ANTICOAGULATION CONSULT NOTE - Initial Consult  Pharmacy Consult for coumadin Indication: atrial fibrillation  Allergies  Allergen Reactions  . Aspirin Hives and Rash  . Penicillins Rash and Other (See Comments)    Amoxicillin also    Patient Measurements:   Heparin Dosing Weight:   Vital Signs: Temp: 98 F (36.7 C) (09/21 1650) Temp Source: Oral (09/21 1650) BP: 102/64 mmHg (09/21 1650) Pulse Rate: 62 (09/21 1650)  Labs: No results for input(s): HGB, HCT, PLT, APTT, LABPROT, INR, HEPARINUNFRC, CREATININE, CKTOTAL, CKMB, TROPONINI in the last 72 hours.  CrCl cannot be calculated (Patient has no serum creatinine result on file.).   Medical History: Past Medical History  Diagnosis Date  . Abnormal CT of the head 12/16/1984    R parietal atrophy  . Nonischemic cardiomyopathy     EF has normalized-repeat Pending   . Hypertension   . GERD (gastroesophageal reflux disease)   . Unspecified venous (peripheral) insufficiency   . Diaphragmatic hernia without mention of obstruction or gangrene   . Female stress incontinence   . Allergic rhinitis, cause unspecified   . Morbid obesity   . Lichenification and lichen simplex chronicus   . Pacemaker  st judes   . Heart murmur   . CHF (congestive heart failure)   . H/O hiatal hernia     two removed  . Anemia   . High cholesterol   . Pneumonia 2004; 2011; 11/2011  . Sleep apnea, obstructive     CPAP  . Exertional dyspnea 06/22/12  . Migraine, unspecified, without mention of intractable migraine without mention of status migrainosus     "when I was a teenager"  . Seizures     "can hear you talking but sounds like you are in big tunnel; have them often if not taking RX; last one was 06/17/12" (06/22/12)  . Stroke 1988    "mouth drawed real bad on my left side"  . Sick sinus syndrome     WITH PRIOR DDD PACEMAKER IMPLANTATION  . Atrial fibrillation   . Syncope and collapse 06/22/12    "hit forehead and left knee"; denies loss of  consciousness  . Arthritis     "hands and knees"  . Influenza A 01/11/2014    Medications:  Scheduled:  . clindamycin (CLEOCIN) IV  600 mg Intravenous 3 times per day  . [START ON 09/07/2015] digoxin  0.125 mg Oral Daily  . [START ON 09/07/2015] enalapril  5 mg Oral Daily  . enoxaparin (LOVENOX) injection  40 mg Subcutaneous Q24H  . furosemide  40 mg Oral QHS  . [START ON 09/07/2015] furosemide  80 mg Oral q morning - 10a  . [START ON 09/07/2015] metoprolol succinate  25 mg Oral Daily  . pantoprazole  40 mg Oral Daily  . PHENobarbital  64.8 mg Oral BID  . sodium chloride  3 mL Intravenous Q12H  . sodium chloride  3 mL Intravenous Q12H  . [START ON 09/07/2015] spironolactone  25 mg Oral Daily   Infusions:    Assessment: 64 yo female with afib will be continued on coumadin.  There's some confusion on how much coumadin she is supposed to be on: per med rec, she is on 12.5 mg on MW, 17.5 mg on Friday and 10 mg on SuTuThSa, but progress note from MD said 7.5 mg daily? (last coumadin dose was 09/20).  INR today is 2.4    Goal of Therapy:  INR 2-3 Monitor platelets by anticoagulation protocol: Yes   Plan:  -  coumadin 12.5 mg po x1 - INR in am  So, Tsz-Yin 09/06/2015,5:22 PM

## 2015-09-07 ENCOUNTER — Ambulatory Visit: Payer: PPO

## 2015-09-07 DIAGNOSIS — I878 Other specified disorders of veins: Secondary | ICD-10-CM

## 2015-09-07 DIAGNOSIS — L0390 Cellulitis, unspecified: Secondary | ICD-10-CM | POA: Diagnosis present

## 2015-09-07 DIAGNOSIS — L03116 Cellulitis of left lower limb: Secondary | ICD-10-CM | POA: Diagnosis not present

## 2015-09-07 HISTORY — DX: Cellulitis, unspecified: L03.90

## 2015-09-07 LAB — CBC
HEMATOCRIT: 42.2 % (ref 36.0–46.0)
HEMOGLOBIN: 13.9 g/dL (ref 12.0–15.0)
MCH: 31.4 pg (ref 26.0–34.0)
MCHC: 32.9 g/dL (ref 30.0–36.0)
MCV: 95.5 fL (ref 78.0–100.0)
Platelets: 144 10*3/uL — ABNORMAL LOW (ref 150–400)
RBC: 4.42 MIL/uL (ref 3.87–5.11)
RDW: 15 % (ref 11.5–15.5)
WBC: 5.9 10*3/uL (ref 4.0–10.5)

## 2015-09-07 LAB — PROTIME-INR
INR: 2.57 — AB (ref 0.00–1.49)
PROTHROMBIN TIME: 27.2 s — AB (ref 11.6–15.2)

## 2015-09-07 MED ORDER — WARFARIN SODIUM 10 MG PO TABS
10.0000 mg | ORAL_TABLET | Freq: Once | ORAL | Status: AC
  Administered 2015-09-07: 10 mg via ORAL
  Filled 2015-09-07: qty 1

## 2015-09-07 NOTE — Progress Notes (Signed)
RN called and stated patient does not wish to wear CPAP tonight.

## 2015-09-07 NOTE — Progress Notes (Signed)
Utilization review completed. Bertha Stanfill, RN, BSN. 

## 2015-09-07 NOTE — Progress Notes (Signed)
Family Medicine Teaching Service Daily Progress Note Intern Pager: [phone]  [name] Medical record number: [id number] Date of birth: 1951/08/08 Age: 64 y.o. Gender: female  Primary Care Provider: Uvaldo Rising, MD Consultants: None Code Status: FULL  Pt Overview and Major Events to Date:  9/21: Admitted for failed outpatient management of cellulitis  Assessment and Plan: Misty Gray is a 64 y.o. female presenting with cellulitis that failed outpatient management. PMH is significant for HTN, HFrEF (EF 30-35%), Afib, venous insufficiency, seizure disorder, OSA.  1. Cellulitis of LLE: Failed outpatient PO doxycycline. Has a history of venous insufficiency. No nidus of infection noted on exam. No h/o DM so not currently covering for Pseudomonas. Afebrile overnight, vitals were stable. WBC on admission was 5.8 and is 5.9 this morning. Cellulitis appears improved compared to yesterday. - continue to monitor on telemetry - IV Clindamycin  tid - INR was 2.4 on admission and 2.57 this morning, so will not get a doppler U/S to rule out DVT at this time. - Blood cultures are pending - Zofran PRN for nausea - Tylenol PRN for pain - Will keep L leg elevated as much as possible today  2. Atrial Fibrillation: HR 62 on admission, not currently in Afib. CHADVASC score 5.  - Coumadin per pharmacy - Continue home meds: Digoxin 0.125mg , Metoprolol succinate  qd - PT 27.2, INR 2.57 - Cardiac monitoring  3. HFrEF: Last ECHO 12/2013: EF 30-35%, moderate hypokinesis, LA and RA moderately dilated, PA peak pressure . Does not appear to be volume overloaded. - Continue home meds: Enalapril  qd, Digoxin 0.125mg  qd, Lasix  qam and  qhs, Spironolactone  qd - Daily weights -Strict I&Os  4. Seizure Disorder: Pt states she has not had a seizure in 1-2 years. - Continue home med: Phenobarbital 64.8mg  bid - Seizure precautions  5. OSA - CPAP at night  6.  GERD - Protonix  qd  FEN/GI: Heart Healthy diet, saline lock IV PPx: Coumadin for Afib  Disposition: Pending improvement in cellulitis.  Subjective:  Doing well this morning. No fevers, chills, nausea, or vomiting. States she ate dinner well last night and has had a good appetite. She feels like her cellulitis is unchanged from yesterday. She notes that her L leg throbs when it is not elevated.  Objective: Temp:  [98 F (36.7 C)-98.1 F (36.7 C)] 98.1 F (36.7 C) (09/22 0432) Pulse Rate:  [62-73] 73 (09/22 0432) Resp:  [17-18] 17 (09/22 0432) BP: (90-114)/(62-90) 97/63 mmHg (09/22 0432) SpO2:  [95 %-100 %] 95 % (09/22 0432) Weight:  [209 lb (94.802 kg)-210 lb 15.7 oz (95.7 kg)] 210 lb 15.7 oz (95.7 kg) (09/21 2042) Physical Exam: General: Well-appearing female, laying in bed, pleasant and conversational. HEENT: [place]/AT, EOMI, MMM Neck: Supple Cardiovascular: Distant heart sounds, RRR, no m/r/g Respiratory: CTAB, normal work of breathing, no wheezes or crackles Abdomen: Obese, +BS, soft, non-tender, well-healed vertical scar present MSK: Normal strength, pitting edema bilaterally that extends almost to the knees, more prominent on the L over the regions of erythema  Skin: Erythema present circumferentially in the LLE, LLE is tender to palpation, RLE with small area of erythema, LLE is warm compared to RLE, no open wounds present       Neuro: Awake, alert, oriented, CN 2-12 grossly intact Psych: Appropriate mood and affect  Laboratory:  Recent Labs Lab 09/06/15 2020 09/07/15 0431  WBC 5.8 5.9  HGB 13.6 13.9  HCT 40.9 42.2  PLT 147*  144*    Recent Labs Lab 09/06/15 1758  NA 137  K 3.5  CL 101  CO2 27  BUN 18  CREATININE 0.82  CALCIUM 9.4  PROT 7.5  BILITOT 0.8  ALKPHOS 101  ALT 18  AST 26  GLUCOSE 97   Blood cultures pending  Imaging/Diagnostic Tests: None  Campbell Stall, MD 09/07/2015, 8:30 AM PGY-1, [facility] FPTS Intern  pager: [phone], text pages welcome

## 2015-09-07 NOTE — Progress Notes (Signed)
ANTICOAGULATION CONSULT NOTE - Follow Up Consult  Pharmacy Consult for Warfarin Indication: atrial fibrillation  Allergies  Allergen Reactions  . Aspirin Hives and Rash  . Penicillins Rash and Other (See Comments)    Amoxicillin also  . Amoxicillin Rash    Patient Measurements: Weight: 210 lb 15.7 oz (95.7 kg)  Vital Signs: Temp: 98.6 F (37 C) (09/22 0958) Temp Source: Oral (09/22 0958) BP: 107/76 mmHg (09/22 1033) Pulse Rate: 89 (09/22 0958)  Labs:  Recent Labs  09/06/15 1758 09/06/15 2020 09/07/15 0431  HGB  --  13.6 13.9  HCT  --  40.9 42.2  PLT  --  147* 144*  LABPROT 25.9*  --  27.2*  INR 2.40*  --  2.57*  CREATININE 0.82  --   --     Estimated Creatinine Clearance: 72.8 mL/min (by C-G formula based on Cr of 0.82).   Assessment: 6 YOF who presented on 9/22 with persistent/worsening cellulitis. The patient was resumed on warfarin from PTA for hx Afib.  The patient's INR today is therapeutic (2.57 << 2.4, goal of 2-3). Hgb/Hct wnl, plts 144 - no overt s/sx of bleeding noted.   The patient's home dose was clarified today and adjusted on the med rec. The patient was also re-educated on warfarin.   Goal of Therapy:  INR 2-3   Plan:  1. Warfarin 10 mg x 1 dose at 1800 today 2. Will continue to monitor for any signs/symptoms of bleeding and will follow up with PT/INR in the a.m.   Georgina Pillion, PharmD, BCPS Clinical Pharmacist Pager: [phone] 09/07/2015 11:49 AM

## 2015-09-07 NOTE — Progress Notes (Signed)
Patient refused CPAP.  Said she does not want to wear CPAP while in hospital.

## 2015-09-08 ENCOUNTER — Encounter (HOSPITAL_COMMUNITY): Payer: Self-pay | Admitting: Student

## 2015-09-08 LAB — PROTIME-INR
INR: 3.22 — ABNORMAL HIGH (ref 0.00–1.49)
Prothrombin Time: 32.3 s — ABNORMAL HIGH (ref 11.6–15.2)

## 2015-09-08 MED ORDER — WARFARIN SODIUM 5 MG PO TABS
5.0000 mg | ORAL_TABLET | Freq: Once | ORAL | Status: AC
  Administered 2015-09-08: 5 mg via ORAL
  Filled 2015-09-08: qty 1

## 2015-09-08 MED ORDER — CETYLPYRIDINIUM CHLORIDE 0.05 % MT LIQD
7.0000 mL | Freq: Two times a day (BID) | OROMUCOSAL | Status: DC
  Administered 2015-09-08 – 2015-09-09 (×2): 7 mL via OROMUCOSAL

## 2015-09-08 MED ORDER — CHLORHEXIDINE GLUCONATE 0.12 % MT SOLN
15.0000 mL | Freq: Two times a day (BID) | OROMUCOSAL | Status: DC
  Administered 2015-09-09 – 2015-09-10 (×3): 15 mL via OROMUCOSAL
  Filled 2015-09-08 (×3): qty 15

## 2015-09-08 NOTE — Evaluation (Signed)
Physical Therapy Evaluation Patient Details Name: Misty Gray MRN: [id number] DOB: [birth year] Today's Date: 09/08/2015   History of Present Illness  Patient is a 64 yo female admitted 09/06/15 with LLE cellulitis.  PMH:  obesity, venous insufficiency, seizures, Afib, HTN, NICM, pacemaker, CHF, OSA on CPAP at night  Clinical Impression  Patient presents with problems listed below.  Will benefit from acute PT to maximize functional independence prior to discharge home with family.    Follow Up Recommendations No PT follow up;Supervision for mobility/OOB    Equipment Recommendations  None recommended by PT    Recommendations for Other Services       Precautions / Restrictions Precautions Precautions: None Restrictions Weight Bearing Restrictions: No      Mobility  Bed Mobility Overal bed mobility: Needs Assistance Bed Mobility: Supine to Sit;Sit to Supine     Supine to sit: Min guard Sit to supine: Min guard   General bed mobility comments: Min guard for safety.  Requires increased time for all mobility.  Transfers Overall transfer level: Needs assistance Equipment used: Rolling walker (2 wheeled) Transfers: Sit to/from Stand Sit to Stand: Min guard         General transfer comment: Verbal cues for hand placement.  Assist for safety only.  Ambulation/Gait Ambulation/Gait assistance: Min guard Ambulation Distance (Feet): 110 Feet Assistive device: Rolling walker (2 wheeled) Gait Pattern/deviations: Step-through pattern;Decreased stance time - left;Decreased stride length;Antalgic Gait velocity: decreased Gait velocity interpretation: Below normal speed for age/gender General Gait Details: Verbal cues for safe use of RW.  Patient with good balance with RW.  Slow, antalgic gait pattern.  Stairs            Wheelchair Mobility    Modified Rankin (Stroke Patients Only)       Balance                                              Pertinent Vitals/Pain Pain Assessment: 0-10 Pain Score: 5  Pain Location: Left LE Pain Descriptors / Indicators: Throbbing;Sore Pain Intervention(s): Limited activity within patient's tolerance;Monitored during session;Repositioned    Home Living Family/patient expects to be discharged to:: Private residence Living Arrangements: Spouse/significant other;Children;Other relatives (Husband, son, and grandchildren) Available Help at Discharge: Family;Available 24 hours/day Type of Home: House Home Access: Stairs to enter Entrance Stairs-Rails: Doctor, general practice of Steps: 2 Home Layout: One level Home Equipment: Cane - single point;Bedside commode;Shower seat      Prior Function Level of Independence: Independent               Hand Dominance        Extremity/Trunk Assessment   Upper Extremity Assessment: Overall WFL for tasks assessed           Lower Extremity Assessment: Generalized weakness;LLE deficits/detail   LLE Deficits / Details: Reddened area lower part of LLE     Communication   Communication: No difficulties  Cognition Arousal/Alertness: Awake/alert Behavior During Therapy: WFL for tasks assessed/performed Overall Cognitive Status: Within Functional Limits for tasks assessed (Decreased safety awareness-?baseline)                      General Comments General comments (skin integrity, edema, etc.): Reddness lower part of BLE's, with Lt worse than Rt.  Edema noted LLE.    Exercises        Assessment/Plan  PT Assessment Patient needs continued PT services  PT Diagnosis Abnormality of gait;Acute pain   PT Problem List Decreased strength;Decreased activity tolerance;Decreased balance;Decreased mobility;Decreased knowledge of use of DME;Decreased safety awareness;Obesity;Decreased skin integrity;Pain  PT Treatment Interventions DME instruction;Gait training;Functional mobility training;Therapeutic  activities;Therapeutic exercise;Patient/family education   PT Goals (Current goals can be found in the Care Plan section) Acute Rehab PT Goals Patient Stated Goal: To go home PT Goal Formulation: With patient/family Time For Goal Achievement: 09/15/15 Potential to Achieve Goals: Good    Frequency Min 3X/week   Barriers to discharge        Co-evaluation               End of Session   Activity Tolerance: Patient tolerated treatment well;Patient limited by pain Patient left: in bed;with call bell/phone within reach;with family/visitor present (LE's elevated)           Time: 0962-8366 PT Time Calculation (min) (ACUTE ONLY): 20 min   Charges:   PT Evaluation $Initial PT Evaluation Tier I: 1 Procedure     PT G CodesVena Austria 2015/09/19, 4:31 PM Durenda Hurt. Renaldo Fiddler, [facility] Acute Rehab Services Pager [phone]

## 2015-09-08 NOTE — Care Management Important Message (Signed)
Important Message  Patient Details  Name: Misty Gray MRN: [id number] Date of Birth: [birth year]   Medicare Important Message Given:  Yes-second notification given    Bernadette Hoit 09/08/2015, 11:18 AM

## 2015-09-08 NOTE — Progress Notes (Signed)
ANTICOAGULATION CONSULT NOTE - Follow Up Consult  Pharmacy Consult for Warfarin Indication: atrial fibrillation  Allergies  Allergen Reactions  . Aspirin Hives and Rash  . Penicillins Rash and Other (See Comments)    Amoxicillin also  . Amoxicillin Rash    Patient Measurements: Weight: 210 lb 9.6 oz (95.528 kg)  Vital Signs: Temp: 97.4 F (36.3 C) (09/23 1000) Temp Source: Oral (09/23 1000) BP: 81/51 mmHg (09/23 1000) Pulse Rate: 72 (09/23 1000)  Labs:  Recent Labs  09/06/15 1758 09/06/15 2020 09/07/15 0431 09/08/15 0546  HGB  --  13.6 13.9  --   HCT  --  40.9 42.2  --   PLT  --  147* 144*  --   LABPROT 25.9*  --  27.2* 32.3*  INR 2.40*  --  2.57* 3.22*  CREATININE 0.82  --   --   --     Estimated Creatinine Clearance: 72.8 mL/min (by C-G formula based on Cr of 0.82).   Assessment: [name] who presented on 9/22 with persistent/worsening cellulitis. The patient was resumed on warfarin from PTA for hx Afib.  The patient's INR today is SUPRAtherapeutic (3.22 << 2.57, goal of 2-3). The jump in INR may be related to some poor appetite noted. Hgb/Hct wnl, plts 144 - no overt s/sx of bleeding noted. Will reduce the warfarin dose today.   The patient has been resumed on PTA phenobarbital which will effect warfarin but is not a new medication. Clinda should not greatly effect warfarin concentrations. Will continue to monitor the patient's appetite.   The patient was re-educated on warfarin this admission.  Goal of Therapy:  INR 2-3   Plan:  1. Warfarin 5 mg x 1 dose at 1800 today 2. Will continue to monitor for any signs/symptoms of bleeding and will follow up with PT/INR in the a.m.   Georgina Pillion, PharmD, BCPS Clinical Pharmacist Pager: [phone] 09/08/2015 11:42 AM

## 2015-09-08 NOTE — Progress Notes (Signed)
Family Medicine Teaching Service Daily Progress Note Intern Pager: [phone]  [name] Medical record number: [id number] Date of birth: Dec 18, 1950 Age: 64 y.o. Gender: female  Primary Care Provider: Uvaldo Rising, MD Consultants: None Code Status: FULL  Pt Overview and Major Events to Date:  9/21: Admitted for failed outpatient management of cellulitis  Assessment and Plan: Misty Gray is a 64 y.o. female presenting with cellulitis that failed outpatient management. PMH is significant for HTN, HFrEF (EF 30-35%), Afib, venous insufficiency, seizure disorder, OSA.  1. Cellulitis of LLE: Failed outpatient PO doxycycline. Has a history of venous insufficiency. No nidus of infection noted on exam. No h/o DM so not currently covering for Pseudomonas. Afebrile overnight, vitals were stable. WBC on admission was 5.8 on admission. Cellulitis appears stable from yesterday, not much improvement.  - continue to monitor on telemetry - IV Clindamycin  tid - INR was 2.4 on admission and 3.22 this morning, so will not get a doppler U/S to rule out DVT at this time. - Blood cultures are pending - Zofran PRN for nausea - Tylenol PRN for pain - Will keep L leg elevated as much as possible today  2. Atrial Fibrillation:  Rate controlled. CHADVASC score 5.  - Coumadin per pharmacy - Continue home meds: Digoxin 0.125mg , Metoprolol succinate  qd - PT 32.3, INR 3.22 - Cardiac monitoring  3. HFrEF: Last ECHO 12/2013: EF 30-35%, moderate hypokinesis, LA and RA moderately dilated, PA peak pressure . Does not appear to be volume overloaded. - Continue home meds: Enalapril  qd, Digoxin 0.125mg  qd, Lasix  qam and  qhs, Spironolactone  qd - Daily weights -Strict I&Os  4. Seizure Disorder: Pt states she has not had a seizure in 1-2 years. - Continue home med: Phenobarbital 64.8mg  bid - Seizure precautions  5. OSA - CPAP at night  6. GERD - Protonix   qd  FEN/GI: Heart Healthy diet, saline lock IV PPx: Coumadin for Afib  Disposition: Pending improvement in cellulitis.  Subjective:  Doing well this morning. No fevers, chills, nausea, or vomiting. . She feels like her cellulitis is unchanged from yesterday.She notes burning sensation in left leg.   Objective: Temp:  [97.8 F (36.6 C)-98.7 F (37.1 C)] 98.7 F (37.1 C) (09/23 0450) Pulse Rate:  [68-89] 68 (09/23 0450) Resp:  [18-20] 20 (09/23 0450) BP: (98-107)/(39-79) 101/39 mmHg (09/23 0450) SpO2:  [96 %-97 %] 96 % (09/23 0450) Weight:  [210 lb 9.6 oz (95.528 kg)] 210 lb 9.6 oz (95.528 kg) (09/22 2014) Physical Exam: General: Well-appearing female, laying in bed, pleasant and conversational. HEENT: [place]/AT, EOMI, MMM Neck: Supple Cardiovascular: Distant heart sounds, occasionally irregular rhythm, normal rate, no m/r/g Respiratory: CTAB, normal work of breathing, no wheezes or crackles Abdomen: Obese, +BS, soft, non-tender, well-healed vertical scar present MSK: Normal strength, pitting edema bilaterally that extends almost to the knees, more prominent on the L over the regions of erythema and pedally  Skin: Erythema present circumferentially in the LLE, LLE is tender to palpation, RLE with small area of erythema, LLE is warm compared to RLE, no open wounds present     Neuro: Awake, alert, oriented, CN 2-12 grossly intact Psych: Appropriate mood and affect  Laboratory:  Recent Labs Lab 09/06/15 2020 09/07/15 0431  WBC 5.8 5.9  HGB 13.6 13.9  HCT 40.9 42.2  PLT 147* 144*    Recent Labs Lab 09/06/15 1758  NA 137  K 3.5  CL 101  CO2 27  BUN 18  CREATININE 0.82  CALCIUM 9.4  PROT 7.5  BILITOT 0.8  ALKPHOS 101  ALT 18  AST 26  GLUCOSE 97   Blood cultures pending  Imaging/Diagnostic Tests: None  Arvilla Market, DO 09/08/2015, 8:22 AM PGY-1, [facility] Family Medicine FPTS Intern pager: [phone], text pages welcome

## 2015-09-08 NOTE — Progress Notes (Signed)
MD at bedside. 

## 2015-09-09 DIAGNOSIS — I872 Venous insufficiency (chronic) (peripheral): Secondary | ICD-10-CM | POA: Diagnosis present

## 2015-09-09 LAB — PROTIME-INR
INR: 3.26 — ABNORMAL HIGH (ref 0.00–1.49)
PROTHROMBIN TIME: 32.6 s — AB (ref 11.6–15.2)

## 2015-09-09 MED ORDER — WARFARIN SODIUM 2.5 MG PO TABS
2.5000 mg | ORAL_TABLET | Freq: Once | ORAL | Status: AC
  Administered 2015-09-09: 2.5 mg via ORAL
  Filled 2015-09-09: qty 1

## 2015-09-09 NOTE — Procedures (Signed)
Pt did not wish to wear our Cpap machine and will notify RT if anything changes. RT will continue to monitor.

## 2015-09-09 NOTE — Progress Notes (Signed)
Family Medicine Teaching Service Daily Progress Note Intern Pager: [phone]  [name] Medical record number: [id number] Date of birth: [birth year] Age: 64 y.o. Gender: female  Primary Care Provider: Uvaldo Rising, MD Consultants: None Code Status: FULL  Pt Overview and Major Events to Date:  9/21: Admitted for failed outpatient management of cellulitis  Assessment and Plan: Misty Gray is a 64 y.o. female presenting with cellulitis that failed outpatient management. PMH is significant for HTN, HFrEF (EF 30-35%), Afib, venous insufficiency, seizure disorder, OSA.  1. Cellulitis of LLE: Failed outpatient PO doxycycline. Has a history of venous insufficiency. No nidus of infection noted on exam. No h/o DM so not currently covering for Pseudomonas. Afebrile overnight, vitals were stable. WBC on admission was 5.8 on admission. Cellulitis appears stable or slightly improved from yesterday - IV Clindamycin  tid. Will continue IV Clindamycin today and likely transition to PO Clindamycin tomorrow. - Blood cultures: NG x 2 days - Zofran PRN for nausea - Tylenol PRN for pain - Encouraged to keep L leg elevated as much as possible  2. Atrial Fibrillation:  Rate controlled. CHADVASC score 5. HRs ranging from 65-72 overnight. - Coumadin per pharmacy - Continue home meds: Digoxin 0.125mg , Metoprolol succinate  qd - PT 32.3, INR 3.22 - Cardiac monitoring  3. HFrEF: Last ECHO 12/2013: EF 30-35%, moderate hypokinesis, LA and RA moderately dilated, PA peak pressure . Does not appear to be volume overloaded. Weights have been stable since admission. - Continue home meds: Enalapril  qd, Digoxin 0.125mg  qd, Lasix  qam and  qhs, Spironolactone  qd - Daily weights -Strict I&Os  4. Seizure Disorder: Pt states she has not had a seizure in 1-2 years. - Continue home med: Phenobarbital 64.8mg  bid - Seizure precautions  5. OSA - CPAP at night  6. GERD -  Protonix  qd  FEN/GI: Heart Healthy diet, saline lock IV PPx: Coumadin for Afib  Disposition: Pending improvement in cellulitis.  Subjective:  Pt is doing well this morning. States that her leg "throbs like a toothache" whenever she sits up and places it on the floor. She feels like her cellulitis is about the same as yesterday.  Objective: Temp:  [97.4 F (36.3 C)-101.5 F (38.6 C)] 97.5 F (36.4 C) (09/24 0801) Pulse Rate:  [65-80] 72 (09/24 0801) Resp:  [18-20] 18 (09/24 0801) BP: (81-99)/(51-79) 96/60 mmHg (09/24 0801) SpO2:  [96 %-100 %] 100 % (09/24 0801) Weight:  [211 lb 3.2 oz (95.8 kg)] 211 lb 3.2 oz (95.8 kg) (09/23 2044) Physical Exam: General: Well-appearing female, laying in bed, pleasant and conversational. HEENT: [place]/AT, EOMI, MMM Neck: Supple Cardiovascular: Distant heart sounds, irregular rhythm, normal rate, no m/r/g Respiratory: CTAB, normal work of breathing, no wheezes or crackles Abdomen: Obese, +BS, soft, non-tender, well-healed vertical scar present MSK: Normal strength, pitting edema bilaterally that extends to the mid-shin, more prominent on the L over the regions of erythema and pedally  Skin: Erythema present circumferentially in the LLE, LLE is tender to palpation, RLE with small area of erythema, LLE is warm compared to RLE, no open wounds present Neuro: Awake, alert, oriented, CN 2-12 grossly intact Psych: Appropriate mood and affect  Laboratory:  Recent Labs Lab 09/06/15 2020 09/07/15 0431  WBC 5.8 5.9  HGB 13.6 13.9  HCT 40.9 42.2  PLT 147* 144*    Recent Labs Lab 09/06/15 1758  NA 137  K 3.5  CL 101  CO2 27  BUN 18  CREATININE  0.82  CALCIUM 9.4  PROT 7.5  BILITOT 0.8  ALKPHOS 101  ALT 18  AST 26  GLUCOSE 97   Blood cultures pending  Imaging/Diagnostic Tests: None  Campbell Stall, MD 09/09/2015, 8:19 AM PGY-1, [facility] Family Medicine FPTS Intern pager: [phone], text pages welcome

## 2015-09-09 NOTE — Progress Notes (Signed)
ANTICOAGULATION CONSULT NOTE - Follow Up Consult  Pharmacy Consult for Warfarin Indication: atrial fibrillation  Allergies  Allergen Reactions  . Aspirin Hives and Rash  . Penicillins Rash and Other (See Comments)    Amoxicillin also  . Amoxicillin Rash    Patient Measurements: Weight: 211 lb 3.2 oz (95.8 kg)  Vital Signs: Temp: 97.5 F (36.4 C) (09/24 0801) Temp Source: Oral (09/24 0801) BP: 96/60 mmHg (09/24 0801) Pulse Rate: 72 (09/24 0801)  Labs:  Recent Labs  09/06/15 1758 09/06/15 2020 09/07/15 0431 09/08/15 0546 09/09/15 0443  HGB  --  13.6 13.9  --   --   HCT  --  40.9 42.2  --   --   PLT  --  147* 144*  --   --   LABPROT 25.9*  --  27.2* 32.3* 32.6*  INR 2.40*  --  2.57* 3.22* 3.26*  CREATININE 0.82  --   --   --   --     Estimated Creatinine Clearance: 72.9 mL/min (by C-G formula based on Cr of 0.82).   Assessment: 61 YOF who presented on 9/22 with persistent/worsening cellulitis. The patient was resumed on warfarin from PTA for hx Afib.  INR today 3.24  Goal of Therapy:  INR 2-3   Plan:  1. Warfarin 2.55 mg x 1 dose at 1800 today 2. Will continue to monitor for any signs/symptoms of bleeding and will follow up with PT/INR in the a.m.   Isaac Bliss, PharmD, BCPS Clinical Pharmacist Pager [phone] 09/09/2015 1:23 PM

## 2015-09-09 NOTE — Progress Notes (Signed)
Pt has refused cpap at this time. 

## 2015-09-09 NOTE — Progress Notes (Signed)
Orthopedic Tech Progress Note Patient Details:  Misty Gray 1951/08/27 [id number]  Ortho Devices Type of Ortho Device: Ace wrap, Unna boot Ortho Device/Splint Interventions: Application   Saul Fordyce 09/09/2015, 3:27 PM

## 2015-09-10 ENCOUNTER — Other Ambulatory Visit: Payer: Self-pay | Admitting: Family Medicine

## 2015-09-10 DIAGNOSIS — I482 Chronic atrial fibrillation, unspecified: Secondary | ICD-10-CM

## 2015-09-10 LAB — PROTIME-INR
INR: 2.59 — ABNORMAL HIGH (ref 0.00–1.49)
Prothrombin Time: 27.4 seconds — ABNORMAL HIGH (ref 11.6–15.2)

## 2015-09-10 MED ORDER — WARFARIN SODIUM 2.5 MG PO TABS: 2.5000 mg | ORAL_TABLET | Freq: Once | ORAL | Status: DC

## 2015-09-10 MED ORDER — WARFARIN SODIUM 5 MG PO TABS: ORAL_TABLET | ORAL | Status: DC

## 2015-09-10 MED ORDER — CLINDAMYCIN HCL 300 MG PO CAPS: 300.0000 mg | ORAL_CAPSULE | Freq: Four times a day (QID) | ORAL | Status: AC

## 2015-09-10 MED ORDER — CLINDAMYCIN HCL 300 MG PO CAPS
300.0000 mg | ORAL_CAPSULE | Freq: Four times a day (QID) | ORAL | Status: DC
  Administered 2015-09-10: 300 mg via ORAL
  Filled 2015-09-10: qty 1

## 2015-09-10 NOTE — Progress Notes (Signed)
Orthopedic Tech Progress Note Patient Details:  Misty Gray 05-22-1951 [id number]  Ortho Devices Type of Ortho Device: Ace wrap, Unna boot Ortho Device/Splint Interventions: Application   Saul Fordyce 09/10/2015, 2:24 PM

## 2015-09-10 NOTE — Discharge Summary (Signed)
Family Medicine Teaching [facility] Discharge Summary  [name] Medical record number: [id number] Date of birth: March 30, 1951 Age: 64 y.o. Gender: female Date of Admission: 09/06/2015  Date of Discharge: 09/10/2015 Admitting Physician: Leighton Roach McDiarmid, MD  Primary Care Provider: Uvaldo Rising, MD Consultants: None  Indication for Hospitalization: Cellulitis that failed outpatient management  Discharge Diagnoses/Problem List:  Cellulitis of LLE Atrial Fibrillation CHF Chronic Venous Insufficiency  Disposition: Home  Discharge Condition: Stable, improved  Discharge Exam:  General: Well-appearing female, standing up in the room, pleasant and conversational. HEENT: [place]/AT, EOMI, MMM Neck: Supple Cardiovascular: Distant heart sounds, irregular rhythm, normal rate, no m/r/g Respiratory: CTAB, normal work of breathing, no wheezes or crackles Abdomen: Obese, +BS, soft, non-tender, well-healed vertical scar present MSK: Normal strength, pitting edema markedly improved on LLE from previous exams.  Skin: Erythema present circumferentially in the LLE that has improved from previous exams, LLE is tender to palpation, RLE with small area of erythema, LLE feels less hot today than previous days, no open wounds present Neuro: Awake, alert, oriented, CN 2-12 grossly intact Psych: Appropriate mood and affect  Brief Hospital Course:  Misty Gray is a 65 year old F who presented as a direct admission from clinic after failing outpatient treatment for a LLE cellulitis. She was seen in clinic on 9/19 and was given PO Doxycycline. She returned to clinic on 9/21 with worsening erythema and was admitted to the hospital. She was placed on IV Clindamycin and her cellulitis improved. She had significant LLE tenderness and edema but she takes Coumadin for Afib and her INR was therapeutic, so we were not concerned for a DVT. She received blood cultures on admission, which showed NG. She was  treated for pain with Tylenol. Her cellulitis continued to improve and she was transitioned to PO Clindamycin on the day of discharge. She should continue to take this for a total of 14 days. She also had an Unna boot placed on the day of discharge, which helped to greatly improve her swelling. Her other chronic medical conditions were stable throughout hospitalization and no changes were made to her medications. She was discharged with a follow-up appointment in our clinic.  Issues for Follow Up:  1. Unna boot placed on 9/25. Please make sure she is still wearing this. Please re-evaluate to see if she needs a new Unna boot to help with her chronic venous insufficiency. 2. Should continue Clindamycin PO for a total of 14 days (end date: October 4th) 3. Pictures were taken of the Pt's LLE in clinic and throughout hospitalization. These can be visualized in the media tab under chart review.  Significant Procedures: None  Significant Labs and Imaging:   Recent Labs Lab 09/06/15 2020 09/07/15 0431  WBC 5.8 5.9  HGB 13.6 13.9  HCT 40.9 42.2  PLT 147* 144*    Recent Labs Lab 09/06/15 1758  NA 137  K 3.5  CL 101  CO2 27  GLUCOSE 97  BUN 18  CREATININE 0.82  CALCIUM 9.4  ALKPHOS 101  AST 26  ALT 18  ALBUMIN 3.2*   Blood cultures: NG   Results/Tests Pending at Time of Discharge: None  Discharge Medications:    Medication List    STOP taking these medications        doxycycline 100 MG capsule  Commonly known as:  VIBRAMYCIN      TAKE these medications        clindamycin 300 MG capsule  Commonly  known as:  CLEOCIN  Take 1 capsule (300 mg total) by mouth every 6 (six) hours.     [employer] D3 1000 UNITS capsule  Generic drug:  Cholecalciferol  TAKE ONE CAPSULE BY MOUTH EVERY MORNING     digoxin 0.125 MG tablet  Commonly known as:  LANOXIN  TAKE 1 TABLET (125 MCG TOTAL) BY MOUTH DAILY.     enalapril 5 MG tablet  Commonly known as:  VASOTEC  TAKE 1 TABLET BY MOUTH DAILY      fluticasone 50 MCG/ACT nasal spray  Commonly known as:  FLONASE  Place 2 sprays into both nostrils daily as needed. For congestion.     furosemide 80 MG tablet  Commonly known as:  LASIX  TAKE ONE TABLET IN THE AM AND 1/2 TABLET IN THE PM     halobetasol 0.05 % cream  Commonly known as:  ULTRAVATE  Apply 1 application topically 2 (two) times daily. Use on legs     metoprolol succinate 25 MG 24 hr tablet  Commonly known as:  TOPROL-XL  TAKE 1 TABLET EVERY DAY     omeprazole 20 MG capsule  Commonly known as:  PRILOSEC  TAKE 1 CAPSULE (20 MG TOTAL) BY MOUTH DAILY.     OXYGEN  Inhale 2 L into the lungs at bedtime.     PHENobarbital 32.4 MG tablet  Commonly known as:  LUMINAL  GIVE 2 TABLETS BY MOUTH EVERY MORNING AND 2 AT BEDTIME     spironolactone 25 MG tablet  Commonly known as:  ALDACTONE  TAKE 1 TABLET (25 MG TOTAL) BY MOUTH DAILY.     warfarin 5 MG tablet  Commonly known as:  COUMADIN  TAKE DAILY AS DIRECTED.        Discharge Instructions: Please refer to Patient Instructions section of EMR for full details.  Patient was counseled important signs and symptoms that should prompt return to medical care, changes in medications, dietary instructions, activity restrictions, and follow up appointments.   Follow-Up Appointments: Follow-up Information    Follow up with Danella Maiers, MD On 09/15/2015.   Specialty:  Family Medicine   Why:  hospital f/u appt at 3:00pm   Contact information:   [address] [place] [address] [phone]       Campbell Stall, MD 09/10/2015, 10:08 PM PGY-1, [facility] Family Medicine

## 2015-09-10 NOTE — Progress Notes (Signed)
ANTICOAGULATION CONSULT NOTE - Follow Up Consult  Pharmacy Consult for Warfarin Indication: atrial fibrillation  Allergies  Allergen Reactions  . Aspirin Hives and Rash  . Penicillins Rash and Other (See Comments)    Amoxicillin also  . Amoxicillin Rash    Patient Measurements: Weight: 211 lb 3.2 oz (95.8 kg)  Vital Signs: Temp: 97.7 F (36.5 C) (09/25 0844) Temp Source: Oral (09/25 0844) BP: 123/63 mmHg (09/25 0844) Pulse Rate: 66 (09/25 0933)  Labs:  Recent Labs  09/08/15 0546 09/09/15 0443 09/10/15 0412  LABPROT 32.3* 32.6* 27.4*  INR 3.22* 3.26* 2.59*    Estimated Creatinine Clearance: 72.9 mL/min (by C-G formula based on Cr of 0.82).   Assessment: 56 YOF who presented on 9/22 with persistent/worsening cellulitis. The patient was resumed on warfarin from PTA for hx Afib.  INR today 2.59  Goal of Therapy:  INR 2-3   Plan:  1. Warfarin 2.5 mg x 1 dose at 1800 today 2. Will continue to monitor for any signs/symptoms of bleeding and will follow up with PT/INR in the a.m.   Isaac Bliss, PharmD, BCPS Clinical Pharmacist Pager [phone] 09/10/2015 10:52 AM

## 2015-09-10 NOTE — Progress Notes (Signed)
Family Medicine Teaching Service Daily Progress Note Intern Pager: [phone]  [name] Medical record number: [id number] Date of birth: 18-May-1951 Age: 64 y.o. Gender: female  Primary Care Provider: Uvaldo Rising, MD Consultants: None Code Status: FULL  Pt Overview and Major Events to Date:  9/21: Admitted for failed outpatient management of cellulitis  Assessment and Plan: Misty Gray is a 64 y.o. female presenting with cellulitis that failed outpatient management. PMH is significant for HTN, HFrEF (EF 30-35%), Afib, venous insufficiency, seizure disorder, OSA.  1. Cellulitis of LLE: Failed outpatient PO doxycycline. Has a history of venous insufficiency. No nidus of infection noted on exam. No h/o DM so not currently covering for Pseudomonas. Afebrile overnight, vitals were stable. WBC on admission was 5.8 on admission. Cellulitis appears improved from yesterday. - Will transition from IV Clindamycin to PO Clindamycin today. - Blood cultures: NG x 3 days - Zofran PRN for nausea - Tylenol PRN for pain - Encouraged to keep L leg elevated as much as possible - Unna boot has been applied, will d/c Pt home with this.  2. Atrial Fibrillation: Rate controlled. CHADVASC score 5. HRs ranging from 61-70 overnight. - Coumadin per pharmacy - Continue home meds: Digoxin 0.125mg , Metoprolol succinate 25mg  qd - PT 32.3, INR 3.22 - Cardiac monitoring  3. HFrEF: Last ECHO 12/2013: EF 30-35%, moderate hypokinesis, LA and RA moderately dilated, PA peak pressure . Does not appear to be volume overloaded. Weights have been stable since admission. - Continue home meds: Enalapril 5mg  qd, Digoxin 0.125mg  qd, Lasix 80mg  qam and 40mg  qhs, Spironolactone 25mg  qd - Daily weights - Strict I&Os  4. Seizure Disorder: Pt states she has not had a seizure in 1-2 years. - Continue home med: Phenobarbital 64.8mg  bid - Seizure precautions  5. OSA - CPAP at night  6. GERD - Protonix  40mg  qd  FEN/GI: Heart Healthy diet, saline lock IV PPx: Coumadin for Afib  Disposition: Likely today, as her cellulitis has improved with Clindamycin.  Subjective:  Pt is doing well this morning. She states she still has some pain when she stands on her left foot, but can ambulate without difficulty. She is ready to go home today.  Objective: Temp:  [97.5 F (36.4 C)-97.6 F (36.4 C)] 97.6 F (36.4 C) (09/25 0437) Pulse Rate:  [61-72] 70 (09/25 0437) Resp:  [14-18] 16 (09/25 0437) BP: (84-100)/(60-79) 84/61 mmHg (09/25 0437) SpO2:  [95 %-100 %] 95 % (09/25 0437) Physical Exam: General: Well-appearing female, standing up in the room, pleasant and conversational. HEENT: [place]/AT, EOMI, MMM Neck: Supple Cardiovascular: Distant heart sounds, irregular rhythm, normal rate, no m/r/g Respiratory: CTAB, normal work of breathing, no wheezes or crackles Abdomen: Obese, +BS, soft, non-tender, well-healed vertical scar present MSK: Normal strength, pitting edema markedly improved on LLE from previous exams.  Skin: Erythema present circumferentially in the LLE that has improved from previous exams, LLE is tender to palpation, RLE with small area of erythema, LLE feels less hot today than previous days, no open wounds present Neuro: Awake, alert, oriented, CN 2-12 grossly intact Psych: Appropriate mood and affect  Laboratory:  Recent Labs Lab 09/06/15 2020 09/07/15 0431  WBC 5.8 5.9  HGB 13.6 13.9  HCT 40.9 42.2  PLT 147* 144*    Recent Labs Lab 09/06/15 1758  NA 137  K 3.5  CL 101  CO2 27  BUN 18  CREATININE 0.82  CALCIUM 9.4  PROT 7.5  BILITOT 0.8  ALKPHOS 101  ALT 18  AST 26  GLUCOSE 97   Blood cultures: NG x 3 days  Imaging/Diagnostic Tests: None  Campbell Stall, MD 09/10/2015, 7:19 AM PGY-1, [facility] Family Medicine FPTS Intern pager: [phone], text pages welcome

## 2015-09-10 NOTE — Discharge Instructions (Signed)
You were hospitalized because you had an infection of the skin on your left leg that was not getting better. We put you on an IV antibiotic (Clindamycin) and your cellulitis improved. We then switched you to an oral antibiotic that you should continue to take four times a day for 10 more days (ending on October 4th).   If you notice the redness is getting worse, if you develop fevers, or if you start to feel very sick, please see [place] in our clinic or come to the emergency department.   Your warfarin dose was adjusted during hospitalization.  You will take: 2.5mg  tonight, then 5mg  on Monday night, then 2.5mg  Tues night.  You will have your INR checked on Wednesday at [facility] and they will give you dose instructions at that time.    Cellulitis Cellulitis is an infection of the skin and the tissue under the skin. The infected area is usually red and tender. This happens most often in the arms and lower legs. HOME CARE   Take your antibiotic medicine as told. Finish the medicine even if you start to feel better.  Keep the infected arm or leg raised (elevated).  Put a warm cloth on the area up to 4 times per day.  Only take medicines as told by your doctor.  Keep all doctor visits as told. GET HELP IF:  You see red streaks on the skin coming from the infected area.  Your red area gets bigger or turns a dark color.  Your bone or joint under the infected area is painful after the skin heals.  Your infection comes back in the same area or different area.  You have a puffy (swollen) bump in the infected area.  You have new symptoms.  You have a fever. GET HELP RIGHT AWAY IF:   You feel very sleepy.  You throw up (vomit) or have watery poop (diarrhea).  You feel sick and have muscle aches and pains. MAKE SURE YOU:   Understand these instructions.  Will watch your condition.  Will get help right away if you are not doing well or get worse. Document Released:  05/20/2008 Document Revised: 04/18/2014 Document Reviewed: 02/17/2012 [employer] [facility] [employer] Patient Information 2015 [place], [place]. This information is not intended to replace advice given to you by your health care provider. Make sure you discuss any questions you have with your health care provider.

## 2015-09-10 NOTE — Progress Notes (Signed)
Patient and family given discharge instructions. They are very eager to go home.  Pt verbalized understanding of all follow-up info, medication changes to Coumadin dosages, and all other instructions.  Henriette Combs already replaced by ortho tech.  No other questions or concerns at this time. Ready for discharge, taken down via wheelchair.

## 2015-09-11 LAB — CULTURE, BLOOD (ROUTINE X 2)
CULTURE: NO GROWTH
CULTURE: NO GROWTH

## 2015-09-13 ENCOUNTER — Ambulatory Visit (INDEPENDENT_AMBULATORY_CARE_PROVIDER_SITE_OTHER): Payer: PPO | Admitting: *Deleted

## 2015-09-13 DIAGNOSIS — Z7901 Long term (current) use of anticoagulants: Secondary | ICD-10-CM

## 2015-09-13 DIAGNOSIS — I4891 Unspecified atrial fibrillation: Secondary | ICD-10-CM

## 2015-09-13 LAB — POCT INR: INR: 1.4

## 2015-09-15 ENCOUNTER — Inpatient Hospital Stay: Payer: PPO | Admitting: Internal Medicine

## 2015-09-15 ENCOUNTER — Ambulatory Visit (INDEPENDENT_AMBULATORY_CARE_PROVIDER_SITE_OTHER): Payer: PPO | Admitting: Internal Medicine

## 2015-09-15 ENCOUNTER — Ambulatory Visit (INDEPENDENT_AMBULATORY_CARE_PROVIDER_SITE_OTHER): Payer: PPO | Admitting: *Deleted

## 2015-09-15 ENCOUNTER — Encounter: Payer: Self-pay | Admitting: Internal Medicine

## 2015-09-15 VITALS — BP 107/72 | HR 71 | Temp 97.7°F | Ht 62.0 in | Wt 205.5 lb

## 2015-09-15 DIAGNOSIS — R791 Abnormal coagulation profile: Secondary | ICD-10-CM

## 2015-09-15 DIAGNOSIS — I4891 Unspecified atrial fibrillation: Secondary | ICD-10-CM

## 2015-09-15 DIAGNOSIS — Z7901 Long term (current) use of anticoagulants: Secondary | ICD-10-CM

## 2015-09-15 DIAGNOSIS — I482 Chronic atrial fibrillation, unspecified: Secondary | ICD-10-CM

## 2015-09-15 DIAGNOSIS — L03116 Cellulitis of left lower limb: Secondary | ICD-10-CM | POA: Diagnosis not present

## 2015-09-15 LAB — POCT INR: INR: 1.3

## 2015-09-15 NOTE — Patient Instructions (Addendum)
Continue clindamycin (4 times a day, morning, lunch, dinner, bedtime). Warfarin 12.5 mg M, W, Friday, Saturday ( 2 and half pills)  and on Tuesday, Thursday, and Sunday do the 10 mg of Warfarin (or 2 pills). On Tuesday, I want you to come in for an INR check. Follow up in about a week with Unna boot and to check on leg.

## 2015-09-15 NOTE — Assessment & Plan Note (Addendum)
INR 1.3 today. Per patient has taken 5 mg of warfarin on Saturday and Tuesday. - Goal INR 2-3  - Will increase Warfarin back to pre-hospital dosing 12.5 mg  M,W, F, Sa and 10 mg T, TH, S - Recheck INR on Tuesday

## 2015-09-15 NOTE — Assessment & Plan Note (Addendum)
Cellulitis, Left Leg, Erythema at mid-calf (on admission at the knee), no warmth to palpation or tenderness. Per patient significantly improved. Patient has been taking clindamycin 3 times a day  - Continue with clindamycin x 4  - Follow up on Friday

## 2015-09-15 NOTE — Progress Notes (Signed)
Patient ID: Misty Gray, female   DOB: 1951/10/01, 64 y.o.   MRN: [id number]    [facility] Noralee Chars, MD Phone: [phone]  Subjective:   # Cellulitis: Has been doing okay. Taking clindamycin 3 times a day. Has had the Unna boot on since discharge. Patient has been having a lot of itching from the [employer]. Otherwise feels much better.  # INR - Patient's INR has decreased. Has been taking a decreased Warfarin dosing  All relevant systems were reviewed and were negative unless otherwise noted in the HPI  Past Medical History Reviewed problem list.  Medications- reviewed and updated Current Outpatient Prescriptions  Medication Sig Dispense Refill  . clindamycin (CLEOCIN) 300 MG capsule Take 1 capsule (300 mg total) by mouth every 6 (six) hours. 40 capsule 0  . [employer] D3 1000 UNITS capsule TAKE ONE CAPSULE BY MOUTH EVERY MORNING 120 capsule 0  . digoxin (LANOXIN) 0.125 MG tablet TAKE 1 TABLET (125 MCG TOTAL) BY MOUTH DAILY. 90 tablet 1  . enalapril (VASOTEC) 5 MG tablet TAKE 1 TABLET BY MOUTH DAILY 90 tablet 1  . fluticasone (FLONASE) 50 MCG/ACT nasal spray Place 2 sprays into both nostrils daily as needed. For congestion. 16 g 1  . furosemide (LASIX) 80 MG tablet TAKE ONE TABLET IN THE AM AND 1/2 TABLET IN THE PM 60 tablet 1  . halobetasol (ULTRAVATE) 0.05 % cream Apply 1 application topically 2 (two) times daily. Use on legs    . metoprolol succinate (TOPROL-XL) 25 MG 24 hr tablet TAKE 1 TABLET EVERY DAY 90 tablet 1  . omeprazole (PRILOSEC) 20 MG capsule TAKE 1 CAPSULE (20 MG TOTAL) BY MOUTH DAILY. 90 capsule 1  . OXYGEN Inhale 2 L into the lungs at bedtime.    Marland Kitchen PHENobarbital (LUMINAL) 32.4 MG tablet GIVE 2 TABLETS BY MOUTH EVERY MORNING AND 2 AT BEDTIME 120 tablet 5  . spironolactone (ALDACTONE) 25 MG tablet TAKE 1 TABLET (25 MG TOTAL) BY MOUTH DAILY. 90 tablet 1  . warfarin (COUMADIN) 5 MG tablet TAKE DAILY AS DIRECTED. 30 tablet 0   No current  facility-administered medications for this visit.   Chief complaint-noted No additions to family history Social history- patient is a  non smoker  Objective: BP 107/72 mmHg  Pulse 71  Temp(Src) 97.7 F (36.5 C) (Oral)  Ht 5\' 2"  (1.575 m)  Wt 205 lb 8 oz (93.214 kg)  BMI 37.58 kg/m2 Gen: NAD, alert, cooperative with exam CV: RRR, good S1/S2, no murmur, cap refill <3 Resp: CTABL, no wheezes, non-labored Ext:  Left leg- no tenderness to palpation or warm, erythema decreased to mid calf from knee, improved from previous images in media tab, moderate venous stasis in right leg  Neuro: Alert and oriented, No gross deficits   Assessment/Plan:  Cellulitis of leg, left Cellulitis, Left Leg, Erythema at mid-calf (on admission at the knee), no warmth to palpation or tenderness. Per patient significantly improved. Patient has been taking clindamycin 3 times a day  - Continue with clindamycin x 4  - Follow up on Friday   Subtherapeutic international normalized ratio (INR) INR 1.3 today. Per patient has taken 5 mg of warfarin on Saturday and Tuesday. - Goal INR 2-3  - Will increase Warfarin back to pre-hospital dosing 12.5 mg  M,W, F, Sa and 10 mg T, TH, S - Recheck INR on Tuesday

## 2015-09-20 ENCOUNTER — Encounter: Payer: Self-pay | Admitting: Family Medicine

## 2015-09-20 ENCOUNTER — Ambulatory Visit (INDEPENDENT_AMBULATORY_CARE_PROVIDER_SITE_OTHER): Payer: PPO | Admitting: Family Medicine

## 2015-09-20 ENCOUNTER — Ambulatory Visit (INDEPENDENT_AMBULATORY_CARE_PROVIDER_SITE_OTHER): Payer: PPO | Admitting: *Deleted

## 2015-09-20 VITALS — BP 105/65 | HR 71 | Temp 97.7°F | Ht 62.0 in | Wt 212.6 lb

## 2015-09-20 DIAGNOSIS — Z7901 Long term (current) use of anticoagulants: Secondary | ICD-10-CM

## 2015-09-20 DIAGNOSIS — I4891 Unspecified atrial fibrillation: Secondary | ICD-10-CM | POA: Diagnosis not present

## 2015-09-20 DIAGNOSIS — I872 Venous insufficiency (chronic) (peripheral): Secondary | ICD-10-CM | POA: Diagnosis not present

## 2015-09-20 DIAGNOSIS — L03116 Cellulitis of left lower limb: Secondary | ICD-10-CM | POA: Diagnosis not present

## 2015-09-20 DIAGNOSIS — M8580 Other specified disorders of bone density and structure, unspecified site: Secondary | ICD-10-CM

## 2015-09-20 LAB — POCT INR: INR: 2.5

## 2015-09-20 MED ORDER — CALCIUM CITRATE-VITAMIN D 315-200 MG-UNIT PO TABS: 1.0000 | ORAL_TABLET | Freq: Two times a day (BID) | ORAL | Status: DC

## 2015-09-20 NOTE — Assessment & Plan Note (Signed)
Much improved from previous photographs No warmth, fevers, tenderness to palpation Finished course of clindamycin Suspect current redness is related to chronic venous stasis changes Return precautions given

## 2015-09-20 NOTE — Progress Notes (Signed)
   Subjective:   Misty Gray is a 64 y.o. female with a history of chronic venous stasis, left lower extremity cellulitis here for cellulitis follow-up  LLE Cellulitis - finished course of Clindamycin yesterday - swelling has gone down - patient reports redness has decreased as well - son reports that it looks the same as when [employer] boot came off at last visit - no more fevers or tenderness  Osteopenia - Pharmacist noted that patient taking Vitamin D, but no calcium supplement - recommended adding Citracal +D (ok to take with PPI)  Review of Systems:  Per HPI.   PMH, PSH, Medications, Allergies, and FmHx reviewed.  Social History: Smoking status noted  Objective:  BP 105/65 mmHg  Pulse 71  Temp(Src) 97.7 F (36.5 C) (Oral)  Ht 5\' 2"  (1.575 m)  Wt 212 lb 9.6 oz (96.435 kg)  BMI 38.88 kg/m2  Gen:  64 y.o. female in NAD HEENT: NCAT, MMM CV: RRR, no MRG Resp: Non-labored, CTAB, no wheezes noted Ext: WWP, 1-2+ pitting edema b/l, no TTP, chronic venous stasis changes on b/l LEs, no redness beyond mid-shin Neuro: Alert and oriented, speech normal    Assessment & Plan:     Misty Gray is a 64 y.o. female here for LLE cellulitis f/u.  Osteopenia Switch vitamin D supplement to Citracal plus D twice a day  Cellulitis of leg, left Much improved from previous photographs No warmth, fevers, tenderness to palpation Finished course of clindamycin Suspect current redness is related to chronic venous stasis changes Return precautions given  Chronic venous insufficiency Current skin changes appear related to chronic venous insufficiency Continue to monitor    Erasmo Downer, MD MPH PGY-2,   Family Medicine 09/20/2015  4:05 PM

## 2015-09-20 NOTE — Assessment & Plan Note (Signed)
Switch vitamin D supplement to Citracal plus D twice a day

## 2015-09-20 NOTE — Patient Instructions (Signed)
Nice to meet you today. I think your skin infection of your leg has resolved. You do not need any more anti-biotics at this time. Your legs will continue to be red from your poor veins.  If your leg gets much more red and swollen over the next several days, please come back to our clinic to see [place]. That could be a sign of a returning skin infection.  Come back to see your primary care doctor for your next scheduled visit.  Take care, Dr. B  Cellulitis Cellulitis is an infection of the skin and the tissue beneath it. The infected area is usually red and tender. Cellulitis occurs most often in the arms and lower legs.  CAUSES  Cellulitis is caused by bacteria that enter the skin through cracks or cuts in the skin. The most common types of bacteria that cause cellulitis are staphylococci and streptococci. SIGNS AND SYMPTOMS   Redness and warmth.  Swelling.  Tenderness or pain.  Fever. DIAGNOSIS  Your health care provider can usually determine what is wrong based on a physical exam. Blood tests may also be done. TREATMENT  Treatment usually involves taking an antibiotic medicine. HOME CARE INSTRUCTIONS   Take your antibiotic medicine as directed by your health care provider. Finish the antibiotic even if you start to feel better.  Keep the infected arm or leg elevated to reduce swelling.  Apply a warm cloth to the affected area up to 4 times per day to relieve pain.  Take medicines only as directed by your health care provider.  Keep all follow-up visits as directed by your health care provider. SEEK MEDICAL CARE IF:   You notice red streaks coming from the infected area.  Your red area gets larger or turns dark in color.  Your bone or joint underneath the infected area becomes painful after the skin has healed.  Your infection returns in the same area or another area.  You notice a swollen bump in the infected area.  You develop new symptoms.  You have a fever. SEEK  IMMEDIATE MEDICAL CARE IF:   You feel very sleepy.  You develop vomiting or diarrhea.  You have a general ill feeling (malaise) with muscle aches and pains.   This information is not intended to replace advice given to you by your health care provider. Make sure you discuss any questions you have with your health care provider.   Document Released: 09/11/2005 Document Revised: 08/23/2015 Document Reviewed: 02/17/2012 Elsevier Interactive Patient Education [employer]

## 2015-09-20 NOTE — Assessment & Plan Note (Signed)
Current skin changes appear related to chronic venous insufficiency Continue to monitor

## 2015-10-04 ENCOUNTER — Ambulatory Visit (INDEPENDENT_AMBULATORY_CARE_PROVIDER_SITE_OTHER): Payer: PPO | Admitting: *Deleted

## 2015-10-04 DIAGNOSIS — Z7901 Long term (current) use of anticoagulants: Secondary | ICD-10-CM

## 2015-10-04 DIAGNOSIS — I4891 Unspecified atrial fibrillation: Secondary | ICD-10-CM | POA: Diagnosis not present

## 2015-10-04 LAB — POCT INR: INR: 2.2

## 2015-10-17 ENCOUNTER — Other Ambulatory Visit: Payer: Self-pay | Admitting: Internal Medicine

## 2015-10-19 ENCOUNTER — Ambulatory Visit (INDEPENDENT_AMBULATORY_CARE_PROVIDER_SITE_OTHER): Payer: PPO | Admitting: *Deleted

## 2015-10-19 ENCOUNTER — Encounter: Payer: Self-pay | Admitting: Family Medicine

## 2015-10-19 ENCOUNTER — Ambulatory Visit (INDEPENDENT_AMBULATORY_CARE_PROVIDER_SITE_OTHER): Payer: PPO | Admitting: Family Medicine

## 2015-10-19 VITALS — BP 105/75 | HR 76 | Temp 97.7°F | Ht 62.0 in | Wt 213.0 lb

## 2015-10-19 DIAGNOSIS — I4891 Unspecified atrial fibrillation: Secondary | ICD-10-CM

## 2015-10-19 DIAGNOSIS — Z7901 Long term (current) use of anticoagulants: Secondary | ICD-10-CM

## 2015-10-19 DIAGNOSIS — I872 Venous insufficiency (chronic) (peripheral): Secondary | ICD-10-CM | POA: Diagnosis not present

## 2015-10-19 LAB — POCT INR: INR: 2.8

## 2015-10-19 MED ORDER — HALOBETASOL PROPIONATE 0.05 % EX CREA: 1.0000 "application " | TOPICAL_CREAM | Freq: Two times a day (BID) | CUTANEOUS | Status: DC

## 2015-10-19 NOTE — Progress Notes (Signed)
   Subjective:    Patient ID: Misty Gray, female    DOB: March 23, 1951, 64 y.o.   MRN: [id number]  HPI 64 year old female presents for follow-up of bilateral leg swelling.  Venous insufficiency/leg swelling - patient recently was admitted for left lower extremity cellulitis and swelling which has resolved, however over the past week she has noted increased swelling in the right lower extremity which is started to drain anteriorly, no associated fevers or chills, no change in color   Review of Systems  Constitutional: Negative for fever, chills and fatigue.  Respiratory: Negative for cough and shortness of breath.        Objective:   Physical Exam Vitals: Reviewed Gen.: Pleasant female, no acute distress Cardiac: Irregular irregular rate and rhythm, no murmurs Respiratory: Clear to auscultation bilaterally, normal effort Extremities: 2+ pitting edema bilateral lower extremities, small amount of serous drainage anteriorly over the right shin, chronic stasis changes without evidence of acute cellulitis  Ace bandage applied in office    Assessment & Plan:  Venous (peripheral) insufficiency Patient presents for worsening swelling and small amount of serous drainage of the right lower extremity. No evidence of acute cellulitis. -Continue elevation and compression with Ace bandages -Return precautions given -Patient to continue Lasix 80 mg in the morning and 40 mg at night

## 2015-10-19 NOTE — Assessment & Plan Note (Signed)
Patient presents for worsening swelling and small amount of serous drainage of the right lower extremity. No evidence of acute cellulitis. -Continue elevation and compression with Ace bandages -Return precautions given -Patient to continue Lasix 80 mg in the morning and 40 mg at night

## 2015-10-19 NOTE — Patient Instructions (Signed)
It was nice to see you today.  Please keep your legs elevated and wear ACE bandages daily.   Return if symptoms worsen.

## 2015-10-20 ENCOUNTER — Encounter (HOSPITAL_COMMUNITY): Payer: Self-pay

## 2015-10-20 ENCOUNTER — Emergency Department (HOSPITAL_COMMUNITY)
Admission: EM | Admit: 2015-10-20 | Discharge: 2015-10-21 | Disposition: A | Discharge status: Home / Self Care | Payer: PPO | Attending: Emergency Medicine | Admitting: Emergency Medicine

## 2015-10-20 DIAGNOSIS — Z880 Allergy status to penicillin: Secondary | ICD-10-CM | POA: Insufficient documentation

## 2015-10-20 DIAGNOSIS — I872 Venous insufficiency (chronic) (peripheral): Secondary | ICD-10-CM | POA: Insufficient documentation

## 2015-10-20 DIAGNOSIS — I10 Essential (primary) hypertension: Secondary | ICD-10-CM | POA: Diagnosis not present

## 2015-10-20 DIAGNOSIS — I509 Heart failure, unspecified: Secondary | ICD-10-CM | POA: Diagnosis not present

## 2015-10-20 DIAGNOSIS — Z862 Personal history of diseases of the blood and blood-forming organs and certain disorders involving the immune mechanism: Secondary | ICD-10-CM | POA: Diagnosis not present

## 2015-10-20 DIAGNOSIS — Z87891 Personal history of nicotine dependence: Secondary | ICD-10-CM | POA: Diagnosis not present

## 2015-10-20 DIAGNOSIS — Z7901 Long term (current) use of anticoagulants: Secondary | ICD-10-CM | POA: Diagnosis not present

## 2015-10-20 DIAGNOSIS — Z8739 Personal history of other diseases of the musculoskeletal system and connective tissue: Secondary | ICD-10-CM | POA: Diagnosis not present

## 2015-10-20 DIAGNOSIS — R011 Cardiac murmur, unspecified: Secondary | ICD-10-CM | POA: Diagnosis not present

## 2015-10-20 DIAGNOSIS — L03119 Cellulitis of unspecified part of limb: Secondary | ICD-10-CM

## 2015-10-20 DIAGNOSIS — Z8673 Personal history of transient ischemic attack (TIA), and cerebral infarction without residual deficits: Secondary | ICD-10-CM | POA: Insufficient documentation

## 2015-10-20 DIAGNOSIS — K219 Gastro-esophageal reflux disease without esophagitis: Secondary | ICD-10-CM | POA: Diagnosis not present

## 2015-10-20 DIAGNOSIS — Z950 Presence of cardiac pacemaker: Secondary | ICD-10-CM | POA: Diagnosis not present

## 2015-10-20 DIAGNOSIS — Z8709 Personal history of other diseases of the respiratory system: Secondary | ICD-10-CM | POA: Insufficient documentation

## 2015-10-20 DIAGNOSIS — L089 Local infection of the skin and subcutaneous tissue, unspecified: Secondary | ICD-10-CM | POA: Diagnosis present

## 2015-10-20 DIAGNOSIS — E7800 Pure hypercholesterolemia, unspecified: Secondary | ICD-10-CM | POA: Insufficient documentation

## 2015-10-20 DIAGNOSIS — G4733 Obstructive sleep apnea (adult) (pediatric): Secondary | ICD-10-CM | POA: Diagnosis not present

## 2015-10-20 DIAGNOSIS — Z79899 Other long term (current) drug therapy: Secondary | ICD-10-CM | POA: Diagnosis not present

## 2015-10-20 DIAGNOSIS — G40909 Epilepsy, unspecified, not intractable, without status epilepticus: Secondary | ICD-10-CM | POA: Diagnosis not present

## 2015-10-20 DIAGNOSIS — G43909 Migraine, unspecified, not intractable, without status migrainosus: Secondary | ICD-10-CM | POA: Insufficient documentation

## 2015-10-20 LAB — CBC WITH DIFFERENTIAL/PLATELET
BASOS ABS: 0 10*3/uL (ref 0.0–0.1)
BASOS PCT: 0 %
EOS ABS: 0 10*3/uL (ref 0.0–0.7)
EOS PCT: 0 %
HCT: 42.5 % (ref 36.0–46.0)
Hemoglobin: 14 g/dL (ref 12.0–15.0)
Lymphocytes Relative: 9 %
Lymphs Abs: 0.5 10*3/uL — ABNORMAL LOW (ref 0.7–4.0)
MCH: 31.5 pg (ref 26.0–34.0)
MCHC: 32.9 g/dL (ref 30.0–36.0)
MCV: 95.7 fL (ref 78.0–100.0)
MONO ABS: 0.3 10*3/uL (ref 0.1–1.0)
Monocytes Relative: 5 %
Neutro Abs: 4.8 10*3/uL (ref 1.7–7.7)
Neutrophils Relative %: 86 %
PLATELETS: 100 10*3/uL — AB (ref 150–400)
RBC: 4.44 MIL/uL (ref 3.87–5.11)
RDW: 14.8 % (ref 11.5–15.5)
WBC: 5.6 10*3/uL (ref 4.0–10.5)

## 2015-10-20 LAB — BASIC METABOLIC PANEL
Anion gap: 12 (ref 5–15)
BUN: 16 mg/dL (ref 6–20)
CALCIUM: 9.2 mg/dL (ref 8.9–10.3)
CO2: 27 mmol/L (ref 22–32)
CREATININE: 1 mg/dL (ref 0.44–1.00)
Chloride: 100 mmol/L — ABNORMAL LOW (ref 101–111)
GFR calc Af Amer: >60 mL/min (ref >60)
GFR, EST NON AFRICAN AMERICAN: 57 mL/min — AB (ref >60)
GLUCOSE: 131 mg/dL — AB (ref 65–99)
Potassium: 3.7 mmol/L (ref 3.5–5.1)
Sodium: 139 mmol/L (ref 135–145)

## 2015-10-20 MED ORDER — ACETAMINOPHEN 325 MG PO TABS
650.0000 mg | ORAL_TABLET | Freq: Once | ORAL | Status: AC
  Administered 2015-10-20: 650 mg via ORAL
  Filled 2015-10-20: qty 2

## 2015-10-20 NOTE — ED Notes (Signed)
Pt here for weeping cellulitis to bilateral lower extremities. Pt states she was seen here for it on the left leg in September but now its spread to her right leg. Pt reports chills, denies N/V/D.

## 2015-10-20 NOTE — ED Provider Notes (Signed)
CSN: [id number]     Arrival date & time 10/20/15  1913 History   First MD Initiated Contact with Patient 10/20/15 1941     Chief Complaint  Patient presents with  . Cellulitis     (Consider location/radiation/quality/duration/timing/severity/associated sxs/prior Treatment) HPI Misty Gray is a 64 y.o. female with history of CHF, pacemaker, venous insufficiency, comes in for evaluation of bilateral lower leg infection. Patient is a very poor historian and is unable to articulate history of present illness. Reports that she was recently diagnosed with a cellulitis in her right leg that has since jumped to her left leg. She denies any fevers, chills, nausea or vomiting, abdominal pain. She is wrapped in both extremities and Ace bandages. She reports bilateral "burning sensation, like a hot coal" on her legs. No chest pain, shortness of breath, cough.  Past Medical History  Diagnosis Date  . Abnormal CT of the head 12/16/1984    R parietal atrophy  . Nonischemic cardiomyopathy (HCC)     EF has normalized-repeat Pending   . Hypertension   . GERD (gastroesophageal reflux disease)   . Unspecified venous (peripheral) insufficiency   . Diaphragmatic hernia without mention of obstruction or gangrene   . Female stress incontinence   . Allergic rhinitis, cause unspecified   . Morbid obesity (HCC)   . Lichenification and lichen simplex chronicus   . Pacemaker  st judes     Patient states "this is my third pacemaker"  . Heart murmur   . CHF (congestive heart failure) (HCC)   . H/O hiatal hernia     two removed  . Anemia   . High cholesterol   . Exertional dyspnea 06/22/12  . Sick sinus syndrome (HCC)     WITH PRIOR DDD PACEMAKER IMPLANTATION  . Atrial fibrillation (HCC)   . Syncope and collapse 06/22/12    "hit forehead and left knee"; denies loss of consciousness  . Influenza A 01/11/2014  . Family history of adverse reaction to anesthesia     "daughter fights w/them; just can't relax during  OR; stay awake during the OR"  . Pneumonia 2004; 2011; 11/2011  . OSA on CPAP   . On home oxygen therapy     "2L when I'm asleep in bed" (09/06/2015)  . History of blood transfusion 1956    "related to nose bleed"  . Migraine     "when I was a teenager"  . Seizures (HCC) since 1981    "can hear you talking but sounds like you are in big tunnel; have them often if not taking RX; last one was 06/17/12" (09/06/2015)  . Grand mal seizure (HCC)   . Stroke [facility]) 1988    "mouth drawed real bad on my left side; found out it was a seizure"  . Arthritis     "hands and knees" (09/06/2015)   Past Surgical History  Procedure Laterality Date  . Leg skin lesion  biopsy / excision  05/16/2001    Lichen Planus  . Appendectomy    . Ventral hernia repair  1989; 04/15/1998  . Hernia repair  ? 1988; 04/15/1998  . Insert / replace / remove pacemaker  12/16/1990    DDD EF 30%;  Marland Kitchen Insert / replace / remove pacemaker  07/25/2003    replaced by BB, leads are both IS1 and from 1992  . Insert / replace / remove pacemaker  05/21/2011    JA; generator change   Family History  Problem Relation Age of Onset  .  Stroke Father     died from CAD?  Marland Kitchen Cancer Mother     melanoma  . Diabetes Son     Type 1  . Sleep apnea Son   . Cancer Sister     cervical  . Hypertension Sister   . Hypertension Brother   . Rheum arthritis Daughter     Also PGM, PGGM   Social History  Substance Use Topics  . Smoking status: Former Smoker -- 1.00 packs/day for 7 years    Types: Cigarettes    Quit date: 03/16/1969  . Smokeless tobacco: Never Used  . Alcohol Use: No   OB History    No data available     Review of Systems A 10 point review of systems was completed and was negative except for pertinent positives and negatives as mentioned in the history of present illness     Allergies  Aspirin; Penicillins; Tape; Amoxicillin; and Ampicillin  Home Medications   Prior to Admission medications   Medication Sig Start Date End  Date Taking? Authorizing Provider  Cholecalciferol (VITAMIN D PO) Take 1 tablet by mouth daily.   Yes Historical Provider, MD  digoxin (LANOXIN) 0.125 MG tablet TAKE 1 TABLET (125 MCG TOTAL) BY MOUTH DAILY. 02/23/15  Yes Uvaldo Rising, MD  enalapril (VASOTEC) 5 MG tablet TAKE 1 TABLET BY MOUTH DAILY 07/12/15  Yes Uvaldo Rising, MD  fluticasone [facility]) 50 MCG/ACT nasal spray Place 2 sprays into both nostrils daily as needed. For congestion. 04/21/15  Yes Renee A Kuneff, DO  furosemide (LASIX) 80 MG tablet TAKE ONE TABLET IN THE AM AND 1/2 TABLET IN THE PM 08/15/15  Yes Uvaldo Rising, MD  metoprolol succinate (TOPROL-XL) 25 MG 24 hr tablet TAKE 1 TABLET EVERY DAY 07/17/15  Yes Uvaldo Rising, MD  omeprazole (PRILOSEC) 20 MG capsule TAKE 1 CAPSULE (20 MG TOTAL) BY MOUTH DAILY. 04/17/15  Yes Leighton Roach McDiarmid, MD  OXYGEN Inhale 2 L into the lungs at bedtime. cpap machine   Yes Historical Provider, MD  PHENobarbital (LUMINAL) 32.4 MG tablet GIVE 2 TABLETS BY MOUTH EVERY MORNING AND 2 AT BEDTIME 05/08/15  Yes Uvaldo Rising, MD  spironolactone (ALDACTONE) 25 MG tablet TAKE 1 TABLET (25 MG TOTAL) BY MOUTH DAILY. 07/10/15  Yes Uvaldo Rising, MD  warfarin (COUMADIN) 5 MG tablet TAKE DAILY AS DIRECTED. Patient taking differently: TAKES 2-2.5 TABLETS BY MOUTH EVERY EVENING (TAKES 12.5MG  ON MON, WED, FRI, SAT AND TAKES  ALL OTHER DAYS 10/17/15  Yes Uvaldo Rising, MD  Calcium Citrate-Vitamin D (CALCIUM + D PO) Take 1 tablet by mouth 2 (two) times daily.    Historical Provider, MD  cephALEXin (KEFLEX) 500 MG capsule Take 1 capsule (500 mg total) by mouth 4 (four) times daily. 10/21/15   Joycie Peek, PA-C   BP 94/61 mmHg  Pulse 69  Temp(Src) 100.2 F (37.9 C) (Oral)  Resp 16  Ht  (1.575 m)  Wt 213 lb (96.616 kg)  BMI 38.95 kg/m2  SpO2 95% Physical Exam  Constitutional: She is oriented to person, place, and time. She appears well-developed and well-nourished.  HENT:  Head: Normocephalic and atraumatic.   Mouth/Throat: Oropharynx is clear and moist.  Eyes: Conjunctivae are normal. Pupils are equal, round, and reactive to light. Right eye exhibits no discharge. Left eye exhibits no discharge. No scleral icterus.  Neck: Neck supple.  Cardiovascular: Normal rate, regular rhythm and normal heart sounds.   Pulmonary/Chest: Effort normal and breath  sounds normal. No respiratory distress. She has no wheezes. She has no rales.  Abdominal: Soft. There is no tenderness.  Musculoskeletal: She exhibits no tenderness.  Neurological: She is alert and oriented to person, place, and time.  Cranial Nerves II-XII grossly intact  Skin: Skin is warm and dry. No rash noted.  2+ pitting edema to bilateral shins. Lower shins and ankles are violaceous bilaterally. No overt erythema or tenderness. No drainage or weeping.  Psychiatric: She has a normal mood and affect.  Nursing note and vitals reviewed.   ED Course  Procedures (including critical care time) Labs Review Labs Reviewed  BASIC METABOLIC PANEL - Abnormal; Notable for the following:    Chloride 100 (*)    Glucose, Bld 131 (*)    GFR calc non Af Amer 57 (*)    All other components within normal limits  CBC WITH DIFFERENTIAL/PLATELET - Abnormal; Notable for the following:    Platelets 100 (*)    Lymphs Abs 0.5 (*)    All other components within normal limits    Imaging Review No results found. I have personally reviewed and evaluated these images and lab results as part of my medical decision-making.   EKG Interpretation None     Meds given in ED:  Medications  acetaminophen (TYLENOL) tablet 650 mg (650 mg Oral Given 10/20/15 2203)    Discharge Medication List as of 10/21/2015 12:07 AM    START taking these medications   Details  cephALEXin (KEFLEX) 500 MG capsule Take 1 capsule (500 mg total) by mouth 4 (four) times daily., Starting 10/21/2015, Until Discontinued, Print       Filed Vitals:   10/20/15 2300 10/20/15 2315 10/20/15  2330 10/21/15 0015  BP: 105/66 100/59 94/58 94/61   Pulse: 59 67 69 69  Temp:      TempSrc:      Resp:      Height:      Weight:      SpO2: 96% 95% 93% 95%    MDM  Vitals stable  -afebrile Pt resting comfortably in ED. PE--physical exam as above, consistent with venous insufficiency. Distal pulses intact no streaking or other evidence of overt infection. However, we'll cover for possible cellulitis. Discharge short course antibiotics and instructed to follow-up with PCP in 2-3 days for wound recheck and overall reevaluation. Overall, patient appears well, nontoxic and is appropriate for discharge. I discussed all relevant lab findings and imaging results with pt and they verbalized understanding. Discussed f/u with PCP within 48 hrs and return precautions, pt very amenable to plan. Prior to patient discharge, I discussed and reviewed this case with my attending, Dr. Criss Alvine who also saw and evaluated the patient and agrees with above plan.  Final diagnoses:  Chronic venous insufficiency  Cellulitis of lower extremity, unspecified laterality       Joycie Peek, PA-C 10/21/15 1543  Pricilla Loveless, MD 10/24/15 2344

## 2015-10-21 MED ORDER — CEPHALEXIN 500 MG PO CAPS: 500.0000 mg | ORAL_CAPSULE | Freq: Four times a day (QID) | ORAL | Status: DC

## 2015-10-21 NOTE — Discharge Instructions (Signed)
There does not appear to be an emergent cause for her symptoms at this time. Your symptoms are likely due to chronic venous stasis it is possible he may have an underlying infection. He will be treated for this with antibiotics. Please take your antibiotics as prescribed. Follow-up with your doctor in 3 days for reevaluation of your wound. Return to ED for new or worsening symptoms..  Venous Stasis or Chronic Venous Insufficiency Chronic venous insufficiency, also called venous stasis, is a condition that affects the veins in the legs. The condition prevents blood from being pumped through these veins effectively. Blood may no longer be pumped effectively from the legs back to the heart. This condition can range from mild to severe. With proper treatment, you should be able to continue with an active life. CAUSES  Chronic venous insufficiency occurs when the vein walls become stretched, weakened, or damaged or when valves within the vein are damaged. Some common causes of this include:  High blood pressure inside the veins (venous hypertension).  Increased blood pressure in the leg veins from long periods of sitting or standing.  A blood clot that blocks blood flow in a vein (deep vein thrombosis).  Inflammation of a superficial vein (phlebitis) that causes a blood clot to form. RISK FACTORS Various things can make you more likely to develop chronic venous insufficiency, including:  Family history of this condition.  Obesity.  Pregnancy.  Sedentary lifestyle.  Smoking.  Jobs requiring long periods of standing or sitting in one place.  Being a certain age. Women in their 44s and 70s and men in their 40s are more likely to develop this condition. SIGNS AND SYMPTOMS  Symptoms may include:   Varicose veins.  Skin breakdown or ulcers.  Reddened or discolored skin on the leg.  Brown, smooth, tight, and painful skin just above the ankle, usually on the inside surface  (lipodermatosclerosis).  Swelling. DIAGNOSIS  To diagnose this condition, your health care provider will take a medical history and do a physical exam. The following tests may be ordered to confirm the diagnosis:  Duplex ultrasound--A procedure that produces a picture of a blood vessel and nearby organs and also provides information on blood flow through the blood vessel.  Plethysmography--A procedure that tests blood flow.  A venogram, or venography--A procedure used to look at the veins using X-ray and dye. TREATMENT The goals of treatment are to help you return to an active life and to minimize pain or disability. Treatment will depend on the severity of the condition. Medical procedures may be needed for severe cases. Treatment options may include:   Use of compression stockings. These can help with symptoms and lower the chances of the problem getting worse, but they do not cure the problem.  Sclerotherapy--A procedure involving an injection of a material that "dissolves" the damaged veins. Other veins in the network of blood vessels take over the function of the damaged veins.  Surgery to remove the vein or cut off blood flow through the vein (vein stripping or laser ablation surgery).  Surgery to repair a valve. HOME CARE INSTRUCTIONS   Wear compression stockings as directed by your health care provider.  Only take over-the-counter or prescription medicines for pain, discomfort, or fever as directed by your health care provider.  Follow up with your health care provider as directed. SEEK MEDICAL CARE IF:   You have redness, swelling, or increasing pain in the affected area.  You see a red streak or line that  extends up or down from the affected area.  You have a breakdown or loss of skin in the affected area, even if the breakdown is small.  You have an injury to the affected area. SEEK IMMEDIATE MEDICAL CARE IF:   You have an injury and open wound in the affected  area.  Your pain is severe and does not improve with medicine.  You have sudden numbness or weakness in the foot or ankle below the affected area, or you have trouble moving your foot or ankle.  You have a fever or persistent symptoms for more than 2-3 days.  You have a fever and your symptoms suddenly get worse. MAKE SURE YOU:   Understand these instructions.  Will watch your condition.  Will get help right away if you are not doing well or get worse.   This information is not intended to replace advice given to you by your health care provider. Make sure you discuss any questions you have with your health care provider.   Document Released: 04/07/2007 Document Revised: 09/22/2013 Document Reviewed: 08/09/2013 Elsevier Interactive Patient Education 2016 [employer] Inc[employer]  Cellulitis Cellulitis is an infection of the skin and the tissue under the skin. The infected area is usually red and tender. This happens most often in the arms and lower legs. HOME CARE   Take your antibiotic medicine as told. Finish the medicine even if you start to feel better.  Keep the infected arm or leg raised (elevated).  Put a warm cloth on the area up to 4 times per day.  Only take medicines as told by your doctor.  Keep all doctor visits as told. GET HELP IF:  You see red streaks on the skin coming from the infected area.  Your red area gets bigger or turns a dark color.  Your bone or joint under the infected area is painful after the skin heals.  Your infection comes back in the same area or different area.  You have a puffy (swollen) bump in the infected area.  You have new symptoms.  You have a fever. GET HELP RIGHT AWAY IF:   You feel very sleepy.  You throw up (vomit) or have watery poop (diarrhea).  You feel sick and have muscle aches and pains.   This information is not intended to replace advice given to you by your health care provider. Make sure you discuss any questions  you have with your health care provider.   Document Released: 05/20/2008 Document Revised: 08/23/2015 Document Reviewed: 02/17/2012 Elsevier Interactive Patient Education [employer]

## 2015-10-27 ENCOUNTER — Encounter: Payer: Self-pay | Admitting: Family Medicine

## 2015-10-27 ENCOUNTER — Ambulatory Visit (INDEPENDENT_AMBULATORY_CARE_PROVIDER_SITE_OTHER): Payer: PPO | Admitting: Family Medicine

## 2015-10-27 VITALS — BP 140/122 | HR 75 | Temp 97.5°F | Ht 62.0 in | Wt 212.8 lb

## 2015-10-27 DIAGNOSIS — L03116 Cellulitis of left lower limb: Secondary | ICD-10-CM | POA: Diagnosis not present

## 2015-10-27 NOTE — Patient Instructions (Signed)
It was nice to see you today.  Your legs are healing well. Continue antibiotic and compression.

## 2015-10-27 NOTE — Assessment & Plan Note (Signed)
Patient presents for emergency room follow-up of cellulitis of the left lower extremity. Clinically improving with Keflex. -Encourage continued Keflex use -Patient to wear compression stockings daily and keep feet elevated to reduce lower extremity edema

## 2015-10-27 NOTE — Progress Notes (Signed)
   Subjective:    Patient ID: [name], female    DOB: 03-21-1951, 64 y.o.   MRN: [id number]  HPI 64 year old female presents for emergency room follow-up. Evaluated on 10/20/2015 for lower extremity edema and cellulitis of the left lower extremity.  Left lower extremity cellulitis secondary to venous insufficiency - improving with Keflex daily, patient has been wearing compression stockings and keeping her feet elevated, no fevers or chills, redness has improved, no current drainage from the lower extremities   Review of Systems  Constitutional: Negative for fever, chills and fatigue.  Respiratory: Negative for cough and shortness of breath.   Cardiovascular: Negative for chest pain.       Objective:   Physical Exam Vitals: Reviewed Extremities: 2+ pitting edema bilateral lower extremities to the midshin Skin: Mild erythema anteriorly over the left shin, no warmth, no active drainage      Assessment & Plan:  Cellulitis of leg, left Patient presents for emergency room follow-up of cellulitis of the left lower extremity. Clinically improving with Keflex. -Encourage continued Keflex use -Patient to wear compression stockings daily and keep feet elevated to reduce lower extremity edema

## 2015-10-31 ENCOUNTER — Other Ambulatory Visit: Payer: Self-pay | Admitting: Family Medicine

## 2015-11-01 ENCOUNTER — Other Ambulatory Visit: Payer: Self-pay | Admitting: Family Medicine

## 2015-11-01 MED ORDER — FUROSEMIDE 80 MG PO TABS: ORAL_TABLET | ORAL | Status: DC

## 2015-11-01 NOTE — Telephone Encounter (Signed)
Needs refill on lasix.  Has completely run out of the pills

## 2015-11-03 ENCOUNTER — Emergency Department (HOSPITAL_COMMUNITY)
Admission: EM | Admit: 2015-11-03 | Discharge: 2015-11-03 | Disposition: A | Discharge status: Home / Self Care | Payer: PPO | Attending: Emergency Medicine | Admitting: Emergency Medicine

## 2015-11-03 ENCOUNTER — Encounter (HOSPITAL_COMMUNITY): Payer: Self-pay | Admitting: *Deleted

## 2015-11-03 DIAGNOSIS — Z8742 Personal history of other diseases of the female genital tract: Secondary | ICD-10-CM | POA: Diagnosis not present

## 2015-11-03 DIAGNOSIS — R509 Fever, unspecified: Secondary | ICD-10-CM | POA: Diagnosis not present

## 2015-11-03 DIAGNOSIS — Z79899 Other long term (current) drug therapy: Secondary | ICD-10-CM | POA: Diagnosis not present

## 2015-11-03 DIAGNOSIS — Z7951 Long term (current) use of inhaled steroids: Secondary | ICD-10-CM | POA: Insufficient documentation

## 2015-11-03 DIAGNOSIS — B372 Candidiasis of skin and nail: Secondary | ICD-10-CM | POA: Diagnosis not present

## 2015-11-03 DIAGNOSIS — Z87891 Personal history of nicotine dependence: Secondary | ICD-10-CM | POA: Insufficient documentation

## 2015-11-03 DIAGNOSIS — Z3202 Encounter for pregnancy test, result negative: Secondary | ICD-10-CM | POA: Insufficient documentation

## 2015-11-03 DIAGNOSIS — R011 Cardiac murmur, unspecified: Secondary | ICD-10-CM | POA: Insufficient documentation

## 2015-11-03 DIAGNOSIS — G4733 Obstructive sleep apnea (adult) (pediatric): Secondary | ICD-10-CM | POA: Insufficient documentation

## 2015-11-03 DIAGNOSIS — Z880 Allergy status to penicillin: Secondary | ICD-10-CM | POA: Insufficient documentation

## 2015-11-03 DIAGNOSIS — R300 Dysuria: Secondary | ICD-10-CM | POA: Diagnosis not present

## 2015-11-03 DIAGNOSIS — I10 Essential (primary) hypertension: Secondary | ICD-10-CM | POA: Diagnosis not present

## 2015-11-03 DIAGNOSIS — G43909 Migraine, unspecified, not intractable, without status migrainosus: Secondary | ICD-10-CM | POA: Insufficient documentation

## 2015-11-03 DIAGNOSIS — Z8673 Personal history of transient ischemic attack (TIA), and cerebral infarction without residual deficits: Secondary | ICD-10-CM | POA: Diagnosis not present

## 2015-11-03 DIAGNOSIS — R197 Diarrhea, unspecified: Secondary | ICD-10-CM

## 2015-11-03 DIAGNOSIS — Z7901 Long term (current) use of anticoagulants: Secondary | ICD-10-CM | POA: Diagnosis not present

## 2015-11-03 DIAGNOSIS — Z872 Personal history of diseases of the skin and subcutaneous tissue: Secondary | ICD-10-CM | POA: Diagnosis not present

## 2015-11-03 DIAGNOSIS — I4891 Unspecified atrial fibrillation: Secondary | ICD-10-CM | POA: Diagnosis not present

## 2015-11-03 DIAGNOSIS — K219 Gastro-esophageal reflux disease without esophagitis: Secondary | ICD-10-CM | POA: Insufficient documentation

## 2015-11-03 DIAGNOSIS — G40909 Epilepsy, unspecified, not intractable, without status epilepticus: Secondary | ICD-10-CM | POA: Insufficient documentation

## 2015-11-03 DIAGNOSIS — Z950 Presence of cardiac pacemaker: Secondary | ICD-10-CM | POA: Insufficient documentation

## 2015-11-03 DIAGNOSIS — Z8701 Personal history of pneumonia (recurrent): Secondary | ICD-10-CM | POA: Insufficient documentation

## 2015-11-03 DIAGNOSIS — Z862 Personal history of diseases of the blood and blood-forming organs and certain disorders involving the immune mechanism: Secondary | ICD-10-CM | POA: Diagnosis not present

## 2015-11-03 DIAGNOSIS — E7800 Pure hypercholesterolemia, unspecified: Secondary | ICD-10-CM | POA: Diagnosis not present

## 2015-11-03 LAB — URINE MICROSCOPIC-ADD ON

## 2015-11-03 LAB — URINALYSIS, ROUTINE W REFLEX MICROSCOPIC
Bilirubin Urine: NEGATIVE
GLUCOSE, UA: NEGATIVE mg/dL
KETONES UR: NEGATIVE mg/dL
LEUKOCYTES UA: NEGATIVE
Nitrite: NEGATIVE
PROTEIN: NEGATIVE mg/dL
Specific Gravity, Urine: 1.01 (ref 1.005–1.030)
pH: 5.5 (ref 5.0–8.0)

## 2015-11-03 LAB — COMPREHENSIVE METABOLIC PANEL
ALBUMIN: 2.9 g/dL — AB (ref 3.5–5.0)
ALT: 18 U/L (ref 14–54)
AST: 24 U/L (ref 15–41)
Alkaline Phosphatase: 76 U/L (ref 38–126)
Anion gap: 11 (ref 5–15)
BILIRUBIN TOTAL: 1 mg/dL (ref 0.3–1.2)
BUN: 8 mg/dL (ref 6–20)
CHLORIDE: 99 mmol/L — AB (ref 101–111)
CO2: 26 mmol/L (ref 22–32)
CREATININE: 0.71 mg/dL (ref 0.44–1.00)
Calcium: 9.3 mg/dL (ref 8.9–10.3)
GFR calc Af Amer: >60 mL/min (ref >60)
GFR calc non Af Amer: >60 mL/min (ref >60)
GLUCOSE: 107 mg/dL — AB (ref 65–99)
POTASSIUM: 3.3 mmol/L — AB (ref 3.5–5.1)
SODIUM: 136 mmol/L (ref 135–145)
Total Protein: 7.5 g/dL (ref 6.5–8.1)

## 2015-11-03 LAB — CBC
HCT: 40.3 % (ref 36.0–46.0)
Hemoglobin: 13 g/dL (ref 12.0–15.0)
MCH: 30.8 pg (ref 26.0–34.0)
MCHC: 32.3 g/dL (ref 30.0–36.0)
MCV: 95.5 fL (ref 78.0–100.0)
PLATELETS: 174 10*3/uL (ref 150–400)
RBC: 4.22 MIL/uL (ref 3.87–5.11)
RDW: 14.5 % (ref 11.5–15.5)
WBC: 8.8 10*3/uL (ref 4.0–10.5)

## 2015-11-03 LAB — I-STAT CG4 LACTIC ACID, ED: LACTIC ACID, VENOUS: 0.95 mmol/L (ref 0.5–2.0)

## 2015-11-03 LAB — PROTIME-INR
INR: 2.57 — ABNORMAL HIGH (ref 0.00–1.49)
PROTHROMBIN TIME: 27.3 s — AB (ref 11.6–15.2)

## 2015-11-03 LAB — LIPASE, BLOOD: Lipase: 22 U/L (ref 11–51)

## 2015-11-03 LAB — POC URINE PREG, ED: Preg Test, Ur: NEGATIVE

## 2015-11-03 MED ORDER — ACIDOPHILUS PROBIOTIC 10 MG PO TABS: 10.0000 mg | ORAL_TABLET | Freq: Three times a day (TID) | ORAL | Status: DC

## 2015-11-03 MED ORDER — NYSTATIN 100000 UNIT/GM EX CREA: TOPICAL_CREAM | CUTANEOUS | Status: DC

## 2015-11-03 MED ORDER — SODIUM CHLORIDE 0.9 % IV BOLUS (SEPSIS)
1000.0000 mL | Freq: Once | INTRAVENOUS | Status: AC
  Administered 2015-11-03: 1000 mL via INTRAVENOUS

## 2015-11-03 NOTE — ED Notes (Signed)
PA at the bedside.

## 2015-11-03 NOTE — Discharge Instructions (Signed)
Diarrhea Diarrhea is frequent loose and watery bowel movements. It can cause you to feel weak and dehydrated. Dehydration can cause you to become tired and thirsty, have a dry mouth, and have decreased urination that often is dark yellow. Diarrhea is a sign of another problem, most often an infection that will not last long. In most cases, diarrhea typically lasts 2-3 days. However, it can last longer if it is a sign of something more serious. It is important to treat your diarrhea as directed by your caregiver to lessen or prevent future episodes of diarrhea. CAUSES  Some common causes include:  Gastrointestinal infections caused by viruses, bacteria, or parasites.  Food poisoning or food allergies.  Certain medicines, such as antibiotics, chemotherapy, and laxatives.  Artificial sweeteners and fructose.  Digestive disorders. HOME CARE INSTRUCTIONS  Ensure adequate fluid intake (hydration): Have 1 cup (8 oz) of fluid for each diarrhea episode. Avoid fluids that contain simple sugars or sports drinks, fruit juices, whole milk products, and sodas. Your urine should be clear or pale yellow if you are drinking enough fluids. Hydrate with an oral rehydration solution that you can purchase at pharmacies, retail stores, and online. You can prepare an oral rehydration solution at home by mixing the following ingredients together:   - tsp table salt.   tsp baking soda.   tsp salt substitute containing potassium chloride.  1  tablespoons sugar.  1 L (34 oz) of water.  Certain foods and beverages may increase the speed at which food moves through the gastrointestinal (GI) tract. These foods and beverages should be avoided and include:  Caffeinated and alcoholic beverages.  High-fiber foods, such as raw fruits and vegetables, nuts, seeds, and whole grain breads and cereals.  Foods and beverages sweetened with sugar alcohols, such as xylitol, sorbitol, and mannitol.  Some foods may be well  tolerated and may help thicken stool including:  Starchy foods, such as rice, toast, pasta, low-sugar cereal, oatmeal, grits, baked potatoes, crackers, and bagels.  Bananas.  Applesauce.  Add probiotic-rich foods to help increase healthy bacteria in the GI tract, such as yogurt and fermented milk products.  Wash your hands well after each diarrhea episode.  Only take over-the-counter or prescription medicines as directed by your caregiver.  Take a warm bath to relieve any burning or pain from frequent diarrhea episodes. SEEK IMMEDIATE MEDICAL CARE IF:   You are unable to keep fluids down.  You have persistent vomiting.  You have blood in your stool, or your stools are black and tarry.  You do not urinate in 6-8 hours, or there is only a small amount of very dark urine.  You have abdominal pain that increases or localizes.  You have weakness, dizziness, confusion, or light-headedness.  You have a severe headache.  Your diarrhea gets worse or does not get better.  You have a fever or persistent symptoms for more than 2-3 days.  You have a fever and your symptoms suddenly get worse. MAKE SURE YOU:   Understand these instructions.  Will watch your condition.  Will get help right away if you are not doing well or get worse.   This information is not intended to replace advice given to you by your health care provider. Make sure you discuss any questions you have with your health care provider.   Document Released: 11/22/2002 Document Revised: 12/23/2014 Document Reviewed: 08/09/2012 Elsevier Interactive Patient Education 2016 [employer] Inc[employer] Cutaneous Candidiasis Cutaneous candidiasis is a condition in which there is an  overgrowth of yeast (candida) on the skin. Yeast normally live on the skin, but in small enough numbers not to cause any symptoms. In certain cases, increased growth of the yeast may cause an actual yeast infection. This kind of infection usually occurs in  areas of the skin that are constantly warm and moist, such as the armpits or the groin. Yeast is the most common cause of diaper rash in babies and in people who cannot control their bowel movements (incontinence). CAUSES  The fungus that most often causes cutaneous candidiasis is Candida albicans. Conditions that can increase the risk of getting a yeast infection of the skin include:  Obesity.  Pregnancy.  Diabetes.  Taking antibiotic medicine.  Taking birth control pills.  Taking steroid medicines.  Thyroid disease.  An iron or zinc deficiency.  Problems with the immune system. SYMPTOMS   Red, swollen area of the skin.  Bumps on the skin.  Itchiness. DIAGNOSIS  The diagnosis of cutaneous candidiasis is usually based on its appearance. Light scrapings of the skin may also be taken and viewed under a microscope to identify the presence of yeast. TREATMENT  Antifungal creams may be applied to the infected skin. In severe cases, oral medicines may be needed.  HOME CARE INSTRUCTIONS   Keep your skin clean and dry.  Maintain a healthy weight.  If you have diabetes, keep your blood sugar under control. SEEK IMMEDIATE MEDICAL CARE IF:  Your rash continues to spread despite treatment.  You have a fever, chills, or abdominal pain.   This information is not intended to replace advice given to you by your health care provider. Make sure you discuss any questions you have with your health care provider.   Document Released: 08/20/2011 Document Revised: 02/24/2012 Document Reviewed: 06/05/2015 Elsevier Interactive Patient Education [employer]

## 2015-11-03 NOTE — ED Notes (Signed)
Pt states diarrhea x 1 week. She feels it is r/t eating mushrooms that were in fridge too long.  Denies fevers.  C/o some abdominal pain.  States stool initially brown, then turned very dark (pt did take Pepto Bismol with no relief).

## 2015-11-03 NOTE — ED Provider Notes (Signed)
CSN: [id number]     Arrival date & time 11/03/15  1308 History   First MD Initiated Contact with Patient 11/03/15 1459     Chief Complaint  Patient presents with  . Diarrhea   Misty Gray is a 64 y.o. female with a history of bilateral lower leg cellulitis, A. fib on coumadin, obesity, hypertension, venous insufficiency, CHF and pacemaker who presents to the emergency department complaining of 5 days of watery diarrhea. The patient reports her diarrhea began after she ate portable mushrooms of her and her friends for several days. Patient reports she is currently on Keflex for lurks from the cellulitis and has 2 pills remaining. Patient reports she's been taking Pepto-Bismol without relief. She reports her stools are dark black and watery. Patient reports 2 episodes of diarrhea today. Patient reports intermittent abdominal cramping but denies current abdominal pain. She reports dysuria for the past 5 days, but denies other urinary symptoms. She reports her bilateral lower extremity cellulitis appears to be improving. The patient has had previous colon resection and hernia repair. The patient denies fevers, chills, abdominal pain, nausea, vomiting, chest pain, worsening shortness of breath, palpitations, syncope, or new rashes.  (Consider location/radiation/quality/duration/timing/severity/associated sxs/prior Treatment) HPI  Past Medical History  Diagnosis Date  . Abnormal CT of the head 12/16/1984    R parietal atrophy  . Nonischemic cardiomyopathy (HCC)     EF has normalized-repeat Pending   . Hypertension   . GERD (gastroesophageal reflux disease)   . Unspecified venous (peripheral) insufficiency   . Diaphragmatic hernia without mention of obstruction or gangrene   . Female stress incontinence   . Allergic rhinitis, cause unspecified   . Morbid obesity (HCC)   . Lichenification and lichen simplex chronicus   . Pacemaker  st judes     Patient states "this is my third pacemaker"  .  Heart murmur   . CHF (congestive heart failure) (HCC)   . H/O hiatal hernia     two removed  . Anemia   . High cholesterol   . Exertional dyspnea 06/22/12  . Sick sinus syndrome (HCC)     WITH PRIOR DDD PACEMAKER IMPLANTATION  . Atrial fibrillation (HCC)   . Syncope and collapse 06/22/12    "hit forehead and left knee"; denies loss of consciousness  . Influenza A 01/11/2014  . Family history of adverse reaction to anesthesia     "daughter fights w/them; just can't relax during OR; stay awake during the OR"  . Pneumonia 2004; 2011; 11/2011  . OSA on CPAP   . On home oxygen therapy     "2L when I'm asleep in bed" (09/06/2015)  . History of blood transfusion 1956    "related to nose bleed"  . Migraine     "when I was a teenager"  . Seizures (HCC) since 1981    "can hear you talking but sounds like you are in big tunnel; have them often if not taking RX; last one was 06/17/12" (09/06/2015)  . Grand mal seizure (HCC)   . Stroke [facility]) 1988    "mouth drawed real bad on my left side; found out it was a seizure"  . Arthritis     "hands and knees" (09/06/2015)   Past Surgical History  Procedure Laterality Date  . Leg skin lesion  biopsy / excision  05/16/2001    Lichen Planus  . Appendectomy    . Ventral hernia repair  1989; 04/15/1998  . Hernia repair  ? 1988; 04/15/1998  .  Insert / replace / remove pacemaker  12/16/1990    DDD EF 30%;  Marland Kitchen Insert / replace / remove pacemaker  07/25/2003    replaced by BB, leads are both IS1 and from 1992  . Insert / replace / remove pacemaker  05/21/2011    JA; generator change   Family History  Problem Relation Age of Onset  . Stroke Father     died from CAD?  Marland Kitchen Cancer Mother     melanoma  . Diabetes Son     Type 1  . Sleep apnea Son   . Cancer Sister     cervical  . Hypertension Sister   . Hypertension Brother   . Rheum arthritis Daughter     Also PGM, PGGM   Social History  Substance Use Topics  . Smoking status: Former Smoker -- 1.00 packs/day for  7 years    Types: Cigarettes    Quit date: 03/16/1969  . Smokeless tobacco: Never Used  . Alcohol Use: No   OB History    No data available     Review of Systems  Constitutional: Negative for fever and chills.  HENT: Negative for congestion and sore throat.   Eyes: Negative for visual disturbance.  Respiratory: Negative for cough, shortness of breath and wheezing.   Cardiovascular: Negative for chest pain and palpitations.  Gastrointestinal: Positive for diarrhea. Negative for nausea, vomiting, abdominal pain and blood in stool.  Genitourinary: Positive for dysuria. Negative for urgency, frequency, hematuria, decreased urine volume and difficulty urinating.  Musculoskeletal: Negative for back pain and neck pain.  Skin: Negative for rash.  Neurological: Positive for light-headedness (resolved. ). Negative for dizziness, syncope and headaches.      Allergies  Aspirin; Penicillins; Tape; Wool alcohol; Amoxicillin; and Ampicillin  Home Medications   Prior to Admission medications   Medication Sig Start Date End Date Taking? Authorizing Provider  Calcium Citrate-Vitamin D (CALCIUM + D PO) Take 1 tablet by mouth 2 (two) times daily.   Yes Historical Provider, MD  cephALEXin (KEFLEX) 500 MG capsule Take 1 capsule (500 mg total) by mouth 4 (four) times daily. 10/21/15  Yes Joycie Peek, PA-C  Cholecalciferol (VITAMIN D PO) Take 1 tablet by mouth daily.   Yes Historical Provider, MD  digoxin (LANOXIN) 0.125 MG tablet TAKE 1 TABLET (125 MCG TOTAL) BY MOUTH DAILY. 02/23/15  Yes Uvaldo Rising, MD  enalapril (VASOTEC) 5 MG tablet TAKE 1 TABLET BY MOUTH DAILY 07/12/15  Yes Uvaldo Rising, MD  fluticasone [facility]) 50 MCG/ACT nasal spray Place 2 sprays into both nostrils daily as needed. For congestion. 04/21/15  Yes Renee A Kuneff, DO  furosemide (LASIX) 80 MG tablet TAKE ONE TABLET IN THE AM AND 1/2 TABLET IN THE PM Patient taking differently: Take 40-80 mg by mouth See admin instructions.  TAKE ONE TABLET IN THE AM AND 1/2 TABLET IN THE PM 11/01/15  Yes Uvaldo Rising, MD  halobetasol (ULTRAVATE) 0.05 % cream Apply 1 application topically every evening. To legs 10/19/15  Yes Historical Provider, MD  metoprolol succinate (TOPROL-XL) 25 MG 24 hr tablet TAKE 1 TABLET EVERY DAY 07/17/15  Yes Uvaldo Rising, MD  omeprazole (PRILOSEC) 20 MG capsule TAKE 1 CAPSULE (20 MG TOTAL) BY MOUTH DAILY. 04/17/15  Yes Leighton Roach McDiarmid, MD  OXYGEN Inhale 2 L into the lungs at bedtime. cpap machine   Yes Historical Provider, MD  PHENobarbital (LUMINAL) 32.4 MG tablet GIVE 2 TABLETS BY MOUTH EVERY MORNING AND 2 AT  BEDTIME 05/08/15  Yes Uvaldo Rising, MD  spironolactone (ALDACTONE) 25 MG tablet TAKE 1 TABLET (25 MG TOTAL) BY MOUTH DAILY. 07/10/15  Yes Uvaldo Rising, MD  warfarin (COUMADIN) 5 MG tablet TAKE DAILY AS DIRECTED. Patient taking differently: TAKES 2-2.5 TABLETS BY MOUTH EVERY EVENING (TAKES 12.5MG  ON MON, WED, FRI, SAT AND TAKES 10MG  ALL OTHER DAYS 10/17/15  Yes Uvaldo Rising, MD  Lactobacillus (ACIDOPHILUS PROBIOTIC) 10 MG TABS Take 10 mg by mouth 3 (three) times daily. 11/03/15   Everlene Farrier, PA-C  nystatin cream (MYCOSTATIN) Apply to affected area 2 times daily 11/03/15   Everlene Farrier, PA-C   BP 100/77 mmHg  Pulse 80  Temp(Src) 98.2 F (36.8 C) (Oral)  Resp 16  Ht 5\' 2"  (1.575 m)  Wt 212 lb (96.163 kg)  BMI 38.77 kg/m2  SpO2 100% Physical Exam  Constitutional: She appears well-developed and well-nourished. No distress.  Nontoxic appearing.  HENT:  Head: Normocephalic and atraumatic.  Mouth/Throat: Oropharynx is clear and moist.  Eyes: Conjunctivae are normal. Pupils are equal, round, and reactive to light. Right eye exhibits no discharge. Left eye exhibits no discharge.  Neck: Neck supple.  Cardiovascular: Normal rate, regular rhythm, normal heart sounds and intact distal pulses.  Exam reveals no gallop and no friction rub.   Pulmonary/Chest: Effort normal and breath sounds normal. No  respiratory distress. She has no wheezes. She has no rales.  Lungs are clear to auscultation bilaterally.  Abdominal: Soft. Bowel sounds are normal. She exhibits no distension. There is tenderness. There is no rebound and no guarding.  Abdomen is soft. Bowel sounds are present. Patient has mild left sided abdominal tenderness to palpation rather appears to be a small hernia. Patient reports this is chronic pain and not new. Otherwise abdomen is nontender to palpation. No peritoneal signs.  Genitourinary: Guaiac negative stool.  Digital rectal exam performed by me with female nurse take a chaperone. Brownish green stool on exam. No gross bloody stool. Patient also appears to have a yeast infection to her gluteal cleft above her anus.   Musculoskeletal: She exhibits no edema.  Lymphadenopathy:    She has no cervical adenopathy.  Neurological: She is alert. Coordination normal.  Skin: Skin is warm and dry. No rash noted. She is not diaphoretic. There is erythema. No pallor.  Patient has bilateral lower extremity cellulitis to her anterior shins. No weeping or discharge. No streaking redness.   Psychiatric: She has a normal mood and affect. Her behavior is normal.  Nursing note and vitals reviewed.   ED Course  Procedures (including critical care time) Labs Review Labs Reviewed  COMPREHENSIVE METABOLIC PANEL - Abnormal; Notable for the following:    Potassium 3.3 (*)    Chloride 99 (*)    Glucose, Bld 107 (*)    Albumin 2.9 (*)    All other components within normal limits  URINALYSIS, ROUTINE W REFLEX MICROSCOPIC (NOT AT [facility]) - Abnormal; Notable for the following:    APPearance TURBID (*)    Hgb urine dipstick TRACE (*)    All other components within normal limits  PROTIME-INR - Abnormal; Notable for the following:    Prothrombin Time 27.3 (*)    INR 2.57 (*)    All other components within normal limits  URINE MICROSCOPIC-ADD ON - Abnormal; Notable for the following:    Squamous  Epithelial / LPF 0-5 (*)    Bacteria, UA RARE (*)    Casts HYALINE CASTS (*)  All other components within normal limits  LIPASE, BLOOD  CBC  POC OCCULT BLOOD, ED  I-STAT CG4 LACTIC ACID, ED  POC URINE PREG, ED    Imaging Review No results found. I have personally reviewed and evaluated these lab results as part of my medical decision-making.   EKG Interpretation None     Filed Vitals:   11/03/15 1715 11/03/15 1830 11/03/15 1831 11/03/15 1915  BP: 102/72 98/72 98/72  100/77  Pulse: 114 73 63 80  Temp:      TempSrc:      Resp:   16   Height:      Weight:      SpO2:  100% 100% 100%    MDM   Meds given in ED:  Medications  sodium chloride 0.9 % bolus 1,000 mL (0 mLs Intravenous Stopped 11/03/15 1832)    Discharge Medication List as of 11/03/2015  7:25 PM    START taking these medications   Details  Lactobacillus (ACIDOPHILUS PROBIOTIC) 10 MG TABS Take 10 mg by mouth 3 (three) times daily., Starting 11/03/2015, Until Discontinued, Print    nystatin cream (MYCOSTATIN) Apply to affected area 2 times daily, Print        Final diagnoses:  Diarrhea, unspecified type  Yeast infection of the skin   This  is a 64 y.o. female with a history of bilateral lower leg cellulitis, A. fib on coumadin, obesity, hypertension, venous insufficiency, CHF and pacemaker who presents to the emergency department complaining of 5 days of watery diarrhea. The patient reports her diarrhea began after she ate portable mushrooms of her and her friends for several days. Patient reports she is currently on Keflex for lurks from the cellulitis and has 2 pills remaining. Patient reports she's been taking Pepto-Bismol without relief. She reports her stools are dark black and watery. Patient reports 2 episodes of diarrhea today. Patient reports intermittent abdominal cramping but denies current abdominal pain.   On exam the patient is afebrile and nontoxic-appearing. Her abdomen is soft and she has mild  left lateral abdominal tenderness to palpation which the patient reports is chronic and unchanged. She has no new abdominal tenderness to palpation. No peritoneal signs. She is well-appearing and very pleasant. She is able to ambulate with normal gait. We'll order blood work, lactic acid, and stool culture. Patient provided with fluid bolus. Patient's lactic acid is 0.95. Urinalysis is nitrite and leukocyte negative. Her INR is therapeutic at 2.57. Lipase is 22. CMP is unremarkable. CBC is within normal limits.  We were unable to obtain stool culture as the patient did not have a bowel movement while in the emergency department. On digital rectal exam she had brownish green stool. No gross bloody stool. Hemoccult is negative. The patient is not orthostatic. In fact, her blood pressure improved upon standing. She reports she is is feeling well. She denies any nausea or abdominal pain. Will discharge the patient with strict return precautions. Will start the patient on a probiotic and nystatin cream for her yeast infection to her gluteal cleft above her buttocks. Patient discharges strict return precautions and advised to return tomorrow if she is not improving. Advised she is to follow-up with her primary care doctor next week even if she is feeling better. I advised the patient to follow-up with their primary care provider this week. I advised the patient to return to the emergency department with new or worsening symptoms or new concerns. The patient verbalized understanding and agreement with plan.  This patient was discussed with and evaluated by Dr. Madilyn Hook who agrees with assessment and plan.    Everlene Farrier, PA-C 11/04/15 7829  Tilden Fossa, MD 11/04/15 [phone]

## 2015-11-07 ENCOUNTER — Ambulatory Visit (INDEPENDENT_AMBULATORY_CARE_PROVIDER_SITE_OTHER): Payer: PPO | Admitting: Family Medicine

## 2015-11-07 ENCOUNTER — Encounter: Payer: Self-pay | Admitting: Family Medicine

## 2015-11-07 VITALS — BP 110/64 | HR 69 | Temp 97.7°F | Ht 62.0 in | Wt 203.4 lb

## 2015-11-07 DIAGNOSIS — R197 Diarrhea, unspecified: Secondary | ICD-10-CM | POA: Diagnosis not present

## 2015-11-07 LAB — COMPREHENSIVE METABOLIC PANEL
ALK PHOS: 71 U/L (ref 33–130)
ALT: 15 U/L (ref 6–29)
AST: 20 U/L (ref 10–35)
Albumin: 3.1 g/dL — ABNORMAL LOW (ref 3.6–5.1)
BILIRUBIN TOTAL: 0.8 mg/dL (ref 0.2–1.2)
BUN: 10 mg/dL (ref 7–25)
CALCIUM: 9.3 mg/dL (ref 8.6–10.4)
CO2: 30 mmol/L (ref 20–31)
CREATININE: 0.49 mg/dL — AB (ref 0.50–0.99)
Chloride: 101 mmol/L (ref 98–110)
Glucose, Bld: 90 mg/dL (ref 65–99)
Potassium: 3.6 mmol/L (ref 3.5–5.3)
Sodium: 140 mmol/L (ref 135–146)
TOTAL PROTEIN: 6.7 g/dL (ref 6.1–8.1)

## 2015-11-07 NOTE — Patient Instructions (Addendum)
Bring [place] the stool sample today or tomorrow If stool tests are normal you will need to go see a stomach doctor Go to ED if unable to stay hydrated or keep food down, or if pain worsens or fevers  Be well, Dr. Pollie Meyer   Diarrhea Diarrhea is frequent loose and watery bowel movements. It can cause you to feel weak and dehydrated. Dehydration can cause you to become tired and thirsty, have a dry mouth, and have decreased urination that often is dark yellow. Diarrhea is a sign of another problem, most often an infection that will not last long. In most cases, diarrhea typically lasts 2-3 days. However, it can last longer if it is a sign of something more serious. It is important to treat your diarrhea as directed by your caregiver to lessen or prevent future episodes of diarrhea. CAUSES  Some common causes include:  Gastrointestinal infections caused by viruses, bacteria, or parasites.  Food poisoning or food allergies.  Certain medicines, such as antibiotics, chemotherapy, and laxatives.  Artificial sweeteners and fructose.  Digestive disorders. HOME CARE INSTRUCTIONS  Ensure adequate fluid intake (hydration): Have 1 cup (8 oz) of fluid for each diarrhea episode. Avoid fluids that contain simple sugars or sports drinks, fruit juices, whole milk products, and sodas. Your urine should be clear or pale yellow if you are drinking enough fluids. Hydrate with an oral rehydration solution that you can purchase at pharmacies, retail stores, and online. You can prepare an oral rehydration solution at home by mixing the following ingredients together:   - tsp table salt.   tsp baking soda.   tsp salt substitute containing potassium chloride.  1  tablespoons sugar.  1 L (34 oz) of water.  Certain foods and beverages may increase the speed at which food moves through the gastrointestinal (GI) tract. These foods and beverages should be avoided and include:  Caffeinated and alcoholic  beverages.  High-fiber foods, such as raw fruits and vegetables, nuts, seeds, and whole grain breads and cereals.  Foods and beverages sweetened with sugar alcohols, such as xylitol, sorbitol, and mannitol.  Some foods may be well tolerated and may help thicken stool including:  Starchy foods, such as rice, toast, pasta, low-sugar cereal, oatmeal, grits, baked potatoes, crackers, and bagels.  Bananas.  Applesauce.  Add probiotic-rich foods to help increase healthy bacteria in the GI tract, such as yogurt and fermented milk products.  Wash your hands well after each diarrhea episode.  Only take over-the-counter or prescription medicines as directed by your caregiver.  Take a warm bath to relieve any burning or pain from frequent diarrhea episodes. SEEK IMMEDIATE MEDICAL CARE IF:   You are unable to keep fluids down.  You have persistent vomiting.  You have blood in your stool, or your stools are black and tarry.  You do not urinate in 6-8 hours, or there is only a small amount of very dark urine.  You have abdominal pain that increases or localizes.  You have weakness, dizziness, confusion, or light-headedness.  You have a severe headache.  Your diarrhea gets worse or does not get better.  You have a fever or persistent symptoms for more than 2-3 days.  You have a fever and your symptoms suddenly get worse. MAKE SURE YOU:   Understand these instructions.  Will watch your condition.  Will get help right away if you are not doing well or get worse.   This information is not intended to replace advice given to you by  your health care provider. Make sure you discuss any questions you have with your health care provider.   Document Released: 11/22/2002 Document Revised: 12/23/2014 Document Reviewed: 08/09/2012 Elsevier Interactive Patient Education [employer].

## 2015-11-07 NOTE — Assessment & Plan Note (Addendum)
Now persistent for 2 weeks. Patient at risk for c diff infection given recent antibiotic use and recent hospitalization. She is unable to give [place] a stool sample today, so we have given her the materials necessary to bring a stool sample in from home either today or tomorrow. Patient presently well appearing and well hydrated. - Will obtain stool culture and c diff culture once she brings these in.  - repeat CMET to ensure stability of electrolytes, renal function, albumin - if labs unrevealing will need referral to GI for further evaluation - Discussed return precautions.

## 2015-11-07 NOTE — Progress Notes (Signed)
Date of Visit: 11/07/2015   HPI:  Pt presents for a same day appointment to discuss diarrhea.  Seen in ED on 11/18 for diarrhea. Labs unremarkable that visit. Was advised to stop keflex, which patient had been taking for recent lower extremity cellulitis. Given rx for acidophilus. Patient has been taking 1 tablet of [facility] brand acidophilus since that ED visit. Stool studies not obtained that visit as patient unable to produce bowel movement.  Has had persistent diarrhea since that time, for a total of about 2 weeks. Initially had blood in stool (small amount) when it first began, but none since then. No fevers but has had a couple of chills. Anything she eats or drinks seems to go straight through her. Has been drinking lots of water to stay hydrated. She perceives that this may be related to eating a mushroom or tunafish sandwich. Is presently eating and drinking well without any vomiting.    ROS: See HPI  PMFSH: history of SA node dysfunction, sleep apnea, vit D deficiency, afib on coumadin, systolic CHF, GERD, morbid obesity, seizures disorders  PHYSICAL EXAM: BP 110/64 mmHg  Pulse 69  Temp(Src) 97.7 F (36.5 C) (Oral)  Ht 5\' 2"  (1.575 m)  Wt 203 lb 6.4 oz (92.262 kg)  BMI 37.19 kg/m2 Gen: NAD, pleasant, cooperative HEENT: normocephalic, atraumatic, moist mucous membranes, mouth clear Heart: regular rate and rhythm no murmur Lungs: clear to auscultation bilaterally, normal work of breathing  Abdomen: soft, nontender to palpation. No masses or organomegaly. Obese. Neuro: grossly nonfocal, speech normal  ASSESSMENT/PLAN:  Diarrhea Now persistent for 2 weeks. Patient at risk for c diff infection given recent antibiotic use and recent hospitalization. She is unable to give [place] a stool sample today, so we have given her the materials necessary to bring a stool sample in from home either today or tomorrow. Patient presently well appearing and well hydrated. - Will obtain stool culture and  c diff culture once she brings these in.  - repeat CMET to ensure stability of electrolytes, renal function, albumin - if labs unrevealing will need referral to GI for further evaluation - Discussed return precautions.    FOLLOW UP: Await stool results before determining follow up plan (here vs. GI)  [place] J. Pollie Meyer, MD [facility]

## 2015-11-08 ENCOUNTER — Encounter: Payer: Self-pay | Admitting: Family Medicine

## 2015-11-08 NOTE — Addendum Note (Signed)
Addended by: Jennette Bill on: 11/08/2015 01:47 PM   Modules accepted: Orders

## 2015-11-12 ENCOUNTER — Other Ambulatory Visit: Payer: Self-pay | Admitting: Family Medicine

## 2015-11-12 LAB — STOOL CULTURE

## 2015-11-13 ENCOUNTER — Telehealth: Payer: Self-pay | Admitting: Family Medicine

## 2015-11-13 ENCOUNTER — Ambulatory Visit (INDEPENDENT_AMBULATORY_CARE_PROVIDER_SITE_OTHER): Payer: PPO | Admitting: *Deleted

## 2015-11-13 DIAGNOSIS — I495 Sick sinus syndrome: Secondary | ICD-10-CM | POA: Diagnosis not present

## 2015-11-13 DIAGNOSIS — A0472 Enterocolitis due to Clostridium difficile, not specified as recurrent: Secondary | ICD-10-CM

## 2015-11-13 LAB — CUP PACEART REMOTE DEVICE CHECK
Battery Remaining Longevity: 74 mo
Brady Statistic RV Percent Paced: 79 %
Implantable Lead Implant Date: 19920327
Implantable Lead Location: 753859
Lead Channel Setting Pacing Amplitude: 2.5 V
Lead Channel Setting Pacing Pulse Width: 0.8 ms
MDC IDC LEAD IMPLANT DT: 19920327
MDC IDC LEAD LOCATION: 753860
MDC IDC MSMT BATTERY REMAINING PERCENTAGE: 73 %
MDC IDC MSMT BATTERY VOLTAGE: 2.92 V
MDC IDC MSMT LEADCHNL RV IMPEDANCE VALUE: 460 Ohm
MDC IDC MSMT LEADCHNL RV PACING THRESHOLD AMPLITUDE: 1 V
MDC IDC MSMT LEADCHNL RV PACING THRESHOLD PULSEWIDTH: 0.8 ms
MDC IDC MSMT LEADCHNL RV SENSING INTR AMPL: 9.2 mV
MDC IDC SESS DTM: 20161125143055
MDC IDC SET LEADCHNL RV SENSING SENSITIVITY: 1.5 mV
Pulse Gen Serial Number: 7254513

## 2015-11-13 NOTE — Telephone Encounter (Signed)
Pt called and would like to know what her test results from last week were. jw

## 2015-11-13 NOTE — Telephone Encounter (Signed)
Still waiting on c diff culture. Should result in the next 1-2 days. The regular stool culture was negative. Please inform patient. I'll call her as soon as I get the c diff test back.  Latrelle Dodrill, MD

## 2015-11-13 NOTE — Telephone Encounter (Signed)
Patient informed, expressed understanding. 

## 2015-11-14 LAB — CLOSTRIDIUM DIFFICILE CULTURE-FECAL

## 2015-11-14 NOTE — Progress Notes (Signed)
Remote pacemaker transmission.   

## 2015-11-15 DIAGNOSIS — A0472 Enterocolitis due to Clostridium difficile, not specified as recurrent: Secondary | ICD-10-CM | POA: Insufficient documentation

## 2015-11-15 LAB — CLOSTRIDIUM DIFFICILE TOXIN: C diff Toxin A: DETECTED — AB

## 2015-11-15 MED ORDER — VANCOMYCIN 50 MG/ML ORAL SOLUTION: 125.0000 mg | Freq: Four times a day (QID) | ORAL | Status: DC

## 2015-11-15 NOTE — Telephone Encounter (Addendum)
Called and was able to reach patient. Informed of result and need for vancomycin therapy. I called her pharmacy and confirmed that they have adequate supply of PO vancomycin. Rx faxed over via Epic (unable to e-prescribe for some reason). Also recommended she not take her PPI (omeprazole) - she reports she only takes this as needed and doesn't have any right now anyways.  Advised patient schedule follow up appointment with PCP in 2 weeks to reassess how she's doing. She was appreciative.  Latrelle Dodrill, MD

## 2015-11-15 NOTE — Addendum Note (Signed)
Addended by: Latrelle Dodrill on: 11/15/2015 12:10 PM   Modules accepted: Orders

## 2015-11-15 NOTE — Telephone Encounter (Addendum)
Attempted to reach patient to discuss POSITIVE c difficile result. No answer on home line. Called mobile number and a female answered and said patient was asleep and recommended calling back in 1 hour.   I will try again to reach her later today. Will plan to prescribe oral vancomycin 125 mg 4 times daily for 10 days. Vancomycin is preferable due to metronidazole potentially interacting with her coumadin.  Latrelle Dodrill, MD

## 2015-11-16 ENCOUNTER — Ambulatory Visit (INDEPENDENT_AMBULATORY_CARE_PROVIDER_SITE_OTHER): Payer: PPO | Admitting: *Deleted

## 2015-11-16 ENCOUNTER — Encounter: Payer: Self-pay | Admitting: Family Medicine

## 2015-11-16 DIAGNOSIS — Z7901 Long term (current) use of anticoagulants: Secondary | ICD-10-CM | POA: Diagnosis not present

## 2015-11-16 DIAGNOSIS — I4891 Unspecified atrial fibrillation: Secondary | ICD-10-CM

## 2015-11-16 LAB — POCT INR: INR: 3.2

## 2015-11-16 NOTE — Progress Notes (Signed)
Ms. Neiderhiser came in to clinic today for her INR check and told lab staff that she was unable to afford her oral vancomycin. Copay for the rx was $55 which patient cannot pay. I spoke with her. She said she could put $35 toward the cost of the medicine. We were able to give her $20 from our indigent fund to cover the remaining cost. She was appreciative.  Latrelle Dodrill, MD

## 2015-11-17 ENCOUNTER — Telehealth: Payer: Self-pay | Admitting: *Deleted

## 2015-11-17 NOTE — Telephone Encounter (Signed)
Called and spoke with patient's pharmacy. The liquid vancomycin is $90. They had incorrectly entered the quantity in when calculating the price patient quoted to me the other day. Fortunately it is available in tablet form, with a copay of >$30. Patient will be able to afford this. She was appreciative.  Latrelle Dodrill, MD

## 2015-11-17 NOTE — Telephone Encounter (Signed)
Patient verified DOB.  Patient is concerned with the cost of Antibiotic which was prescribed on 11/15/15. Patient states the cost of the medication is not covered under her insurance.  Patient states the medication cost 90$ which she does not have. Patient would like to speak about the medication which was ordered incorrectly.

## 2015-11-17 NOTE — Telephone Encounter (Signed)
I am routing this to Dr. Pollie Meyer who cared for Ms. Tadesse for this issue.

## 2015-11-24 ENCOUNTER — Encounter: Payer: Self-pay | Admitting: Cardiology

## 2015-11-27 ENCOUNTER — Ambulatory Visit (INDEPENDENT_AMBULATORY_CARE_PROVIDER_SITE_OTHER): Payer: PPO | Admitting: *Deleted

## 2015-11-27 DIAGNOSIS — Z7901 Long term (current) use of anticoagulants: Secondary | ICD-10-CM

## 2015-11-27 DIAGNOSIS — I4891 Unspecified atrial fibrillation: Secondary | ICD-10-CM

## 2015-11-27 LAB — POCT INR: INR: 3.3

## 2015-11-28 ENCOUNTER — Other Ambulatory Visit: Payer: Self-pay

## 2015-11-28 ENCOUNTER — Other Ambulatory Visit: Payer: Self-pay | Admitting: Family Medicine

## 2015-11-28 DIAGNOSIS — Z1231 Encounter for screening mammogram for malignant neoplasm of breast: Secondary | ICD-10-CM

## 2015-11-28 NOTE — Telephone Encounter (Signed)
RN staff - please call in Luminal (phenobarbital) 32.4 mg 2 tablets PO BID; dispense #120, refill #5, thanks

## 2015-12-04 ENCOUNTER — Ambulatory Visit: Payer: PPO

## 2015-12-04 NOTE — Telephone Encounter (Signed)
Rx called in to pharmacy. 

## 2015-12-12 ENCOUNTER — Other Ambulatory Visit: Payer: Self-pay | Admitting: Family Medicine

## 2015-12-12 ENCOUNTER — Other Ambulatory Visit: Payer: Self-pay | Admitting: *Deleted

## 2015-12-12 ENCOUNTER — Ambulatory Visit (INDEPENDENT_AMBULATORY_CARE_PROVIDER_SITE_OTHER): Payer: PPO | Admitting: *Deleted

## 2015-12-12 DIAGNOSIS — I4891 Unspecified atrial fibrillation: Secondary | ICD-10-CM

## 2015-12-12 LAB — POCT INR: INR: 2.5

## 2015-12-12 MED ORDER — WARFARIN SODIUM 5 MG PO TABS: ORAL_TABLET | ORAL | Status: DC

## 2015-12-14 ENCOUNTER — Telehealth: Payer: Self-pay | Admitting: Family Medicine

## 2015-12-14 NOTE — Telephone Encounter (Signed)
FYI: Pt calling and explains that she needs masks for her oxygen tank because hers are worn out. She states that [facility] will be faxing a requisition form for this issue. Misty Gray, ASA

## 2015-12-19 ENCOUNTER — Ambulatory Visit
Admission: RE | Admit: 2015-12-19 | Discharge: 2015-12-19 | Disposition: A | Discharge status: Home / Self Care | Payer: Medicare Other | Source: Ambulatory Visit

## 2015-12-19 DIAGNOSIS — Z1231 Encounter for screening mammogram for malignant neoplasm of breast: Secondary | ICD-10-CM | POA: Diagnosis not present

## 2015-12-26 NOTE — Telephone Encounter (Signed)
RN staff - I have not received this order request, please call [facility] and have them re-fax the request, thanks

## 2015-12-29 NOTE — Telephone Encounter (Signed)
Spoke with patient she will contact [facility] and have them refax forms.

## 2016-01-08 DIAGNOSIS — G4733 Obstructive sleep apnea (adult) (pediatric): Secondary | ICD-10-CM | POA: Diagnosis not present

## 2016-01-09 ENCOUNTER — Ambulatory Visit (INDEPENDENT_AMBULATORY_CARE_PROVIDER_SITE_OTHER): Payer: Medicare Other | Admitting: *Deleted

## 2016-01-09 DIAGNOSIS — I4891 Unspecified atrial fibrillation: Secondary | ICD-10-CM | POA: Diagnosis not present

## 2016-01-09 LAB — POCT INR: INR: 3

## 2016-01-09 NOTE — Telephone Encounter (Signed)
RN staff - I have not yet received these forms, please check with [facility] on the status

## 2016-01-11 ENCOUNTER — Other Ambulatory Visit: Payer: Self-pay | Admitting: Family Medicine

## 2016-01-12 ENCOUNTER — Other Ambulatory Visit: Payer: Self-pay | Admitting: Family Medicine

## 2016-01-12 NOTE — Telephone Encounter (Signed)
According to Henderson Newcomer with [facility] you can just fax a rx or DME order to [facility] at [phone]. Will forward to MD

## 2016-01-12 NOTE — Telephone Encounter (Signed)
Epic message sent to [facility] with [facility], awaiting response.

## 2016-01-15 ENCOUNTER — Telehealth: Payer: Self-pay | Admitting: *Deleted

## 2016-01-15 NOTE — Telephone Encounter (Signed)
Placed prescription for oxygen masks in fax box to be sent to [facility].

## 2016-01-15 NOTE — Telephone Encounter (Signed)
Noted. My main concern was the oxygen masks which is sounds like they have.

## 2016-01-15 NOTE — Telephone Encounter (Signed)
Call from [facility] stating they received a Rx for oxygen tubing.  They do not supply tubing for oxygen.  Patient will would need to call the company that supplies her oxygen for the tubing.  Clovis Pu, RN

## 2016-01-18 ENCOUNTER — Other Ambulatory Visit: Payer: Self-pay | Admitting: Family Medicine

## 2016-01-26 ENCOUNTER — Encounter: Payer: Self-pay | Admitting: Internal Medicine

## 2016-02-06 ENCOUNTER — Ambulatory Visit (INDEPENDENT_AMBULATORY_CARE_PROVIDER_SITE_OTHER): Payer: Medicare Other | Admitting: *Deleted

## 2016-02-06 DIAGNOSIS — I4891 Unspecified atrial fibrillation: Secondary | ICD-10-CM

## 2016-02-06 LAB — POCT INR: INR: 4.4

## 2016-02-13 ENCOUNTER — Encounter: Payer: Self-pay | Admitting: Internal Medicine

## 2016-02-13 ENCOUNTER — Ambulatory Visit (INDEPENDENT_AMBULATORY_CARE_PROVIDER_SITE_OTHER): Payer: Medicare Other | Admitting: Internal Medicine

## 2016-02-13 VITALS — BP 106/73 | HR 70 | Ht 62.0 in | Wt 197.0 lb

## 2016-02-13 DIAGNOSIS — I872 Venous insufficiency (chronic) (peripheral): Secondary | ICD-10-CM

## 2016-02-13 DIAGNOSIS — Z950 Presence of cardiac pacemaker: Secondary | ICD-10-CM

## 2016-02-13 DIAGNOSIS — I482 Chronic atrial fibrillation: Secondary | ICD-10-CM

## 2016-02-13 DIAGNOSIS — I5022 Chronic systolic (congestive) heart failure: Secondary | ICD-10-CM | POA: Diagnosis not present

## 2016-02-13 DIAGNOSIS — I4821 Permanent atrial fibrillation: Secondary | ICD-10-CM

## 2016-02-13 LAB — CUP PACEART INCLINIC DEVICE CHECK
Battery Voltage: 2.93 V
Date Time Interrogation Session: 20170228171404
Implantable Lead Location: 753859
Implantable Lead Location: 753860
Lead Channel Impedance Value: 487.5 Ohm
Lead Channel Pacing Threshold Amplitude: 1 V
Lead Channel Pacing Threshold Pulse Width: 0.8 ms
Lead Channel Sensing Intrinsic Amplitude: 6.4 mV
Lead Channel Setting Pacing Amplitude: 2.5 V
MDC IDC LEAD IMPLANT DT: 19920327
MDC IDC LEAD IMPLANT DT: 19920327
MDC IDC MSMT BATTERY REMAINING LONGEVITY: 82.8
MDC IDC SET LEADCHNL RV PACING PULSEWIDTH: 0.8 ms
MDC IDC SET LEADCHNL RV SENSING SENSITIVITY: 1.5 mV
MDC IDC STAT BRADY RA PERCENT PACED: 0 %
MDC IDC STAT BRADY RV PERCENT PACED: 79 %
Pulse Gen Model: 2210
Pulse Gen Serial Number: 7254513

## 2016-02-13 NOTE — Progress Notes (Signed)
PCP: Uvaldo Rising, MD  Misty Gray is a 64 y.o. female who presents today for routine electrophysiology followup.  Since last being seen in our clinic, the patient reports doing well.  She denies symptoms with her afib.  She has SOB with moderate activity which is stable. She reports "I have had shortness of breath since I was 12".  She remains active, without limitation presently. + chronic venous insufficiency.  Today, she denies symptoms of palpitations, dizziness, presyncope, or syncope.  The patient is otherwise without complaint today.   Past Medical History  Diagnosis Date  . Abnormal CT of the head 12/16/1984    R parietal atrophy  . Nonischemic cardiomyopathy (HCC)     EF has normalized-repeat Pending   . Hypertension   . GERD (gastroesophageal reflux disease)   . Unspecified venous (peripheral) insufficiency   . Diaphragmatic hernia without mention of obstruction or gangrene   . Female stress incontinence   . Allergic rhinitis, cause unspecified   . Morbid obesity (HCC)   . Lichenification and lichen simplex chronicus   . Pacemaker  st judes     Patient states "this is my third pacemaker"  . Heart murmur   . CHF (congestive heart failure) (HCC)   . H/O hiatal hernia     two removed  . Anemia   . High cholesterol   . Exertional dyspnea 06/22/12  . Sick sinus syndrome (HCC)     WITH PRIOR DDD PACEMAKER IMPLANTATION  . Atrial fibrillation (HCC)   . Syncope and collapse 06/22/12    "hit forehead and left knee"; denies loss of consciousness  . Influenza A 01/11/2014  . Family history of adverse reaction to anesthesia     "daughter fights w/them; just can't relax during OR; stay awake during the OR"  . Pneumonia 2004; 2011; 11/2011  . OSA on CPAP   . On home oxygen therapy     "2L when I'm asleep in bed" (09/06/2015)  . History of blood transfusion 1956    "related to nose bleed"  . Migraine     "when I was a teenager"  . Seizures (HCC) since 1981    "can hear you talking  but sounds like you are in big tunnel; have them often if not taking RX; last one was 06/17/12" (09/06/2015)  . Grand mal seizure (HCC)   . Stroke [facility]) 1988    "mouth drawed real bad on my left side; found out it was a seizure"  . Arthritis     "hands and knees" (09/06/2015)   Past Surgical History  Procedure Laterality Date  . Leg skin lesion  biopsy / excision  05/16/2001    Lichen Planus  . Appendectomy    . Ventral hernia repair  1989; 04/15/1998  . Hernia repair  ? 1988; 04/15/1998  . Insert / replace / remove pacemaker  12/16/1990    DDD EF 30%;  Marland Kitchen Insert / replace / remove pacemaker  07/25/2003    replaced by BB, leads are both IS1 and from 1992  . Insert / replace / remove pacemaker  05/21/2011    JA; generator change    Current Outpatient Prescriptions  Medication Sig Dispense Refill  . Calcium Citrate-Vitamin D (CALCIUM + D PO) Take 1 tablet by mouth 2 (two) times daily.    . digoxin (LANOXIN) 0.125 MG tablet TAKE 1 TABLET (125 MCG TOTAL) BY MOUTH DAILY. 90 tablet 1  . enalapril (VASOTEC) 5 MG tablet TAKE 1 TABLET  BY MOUTH DAILY 90 tablet 1  . fluticasone (FLONASE) 50 MCG/ACT nasal spray Place 2 sprays into both nostrils daily as needed. For congestion. 16 g 1  . furosemide (LASIX) 80 MG tablet TAKE ONE TABLET IN THE AM AND 1/2 TABLET IN THE PM (Patient taking differently: Take 40-80 mg by mouth See admin instructions. TAKE ONE TABLET IN THE AM AND 1/2 TABLET IN THE PM) 60 tablet 1  . halobetasol (ULTRAVATE) 0.05 % cream Apply 1 application topically every evening. To legs  1  . Lactobacillus (ACIDOPHILUS PROBIOTIC) 10 MG TABS Take 10 mg by mouth 3 (three) times daily. 30 tablet 0  . metoprolol succinate (TOPROL-XL) 25 MG 24 hr tablet TAKE 1 TABLET BY MOUTH EVERY DAY 90 tablet 1  . nystatin cream (MYCOSTATIN) Apply to affected area 2 times daily 15 g 1  . omeprazole (PRILOSEC) 20 MG capsule TAKE ONE CAPSULE BY MOUTH EVERY DAY 90 capsule 1  . OXYGEN Inhale 2 L into the lungs at bedtime.  cpap machine    . PHENobarbital (LUMINAL) 32.4 MG tablet TAKE 2 TABLETS BY MOUTH TWICE A DAY 120 tablet 5  . spironolactone (ALDACTONE) 25 MG tablet TAKE 1 TABLET (25 MG TOTAL) BY MOUTH DAILY. 90 tablet 1  . warfarin (COUMADIN) 5 MG tablet TAKE 2-2.5 (12.5MG ) TABLETS MON, WED, FRI AND SAT EVENINGS & 2 TABS (10MG ) ALL OTHER EVENINGS 90 tablet 1   No current facility-administered medications for this visit.    Physical Exam: Filed Vitals:   02/13/16 1621  BP: 106/73  Pulse: 70  Height: 5\' 2"  (1.575 m)  Weight: 197 lb (89.359 kg)    GEN- The patient is overweight and disheveled appearing, alert and oriented x 3 today.   Head- normocephalic, atraumatic Eyes-  Sclera clear, conjunctiva pink Ears- hearing intact Oropharynx- clear Lungs- Clear to ausculation bilaterally, normal work of breathing Chest- pacemaker pocket is well healed Heart- irregular rate and rhythm, no murmurs, rubs or gallops, PMI not laterally displaced GI- soft, NT, ND, + BS Extremities- no clubbing, cyanosis, 1+ edema with venous stasis changes  Pacemaker interrogation- reviewed in detail today,  See PACEART report  Assessment and Plan:  1. Symptomatic bradycardia Normal pacemaker function See Pace Art report  2 afib She has asymptomatic permanent afib Continue coumadin for anticoagulation.  Not a candidate for NOAC therapy presently as she is on phenobarbitol.  No changes today  3. Chronic systolic dysfunction Stable She is not interested in upgrade to CRT or any further cardiac workup at this time  I will see in a year Willa Rough MD, [facility] 02/13/2016 4:35 PM

## 2016-02-13 NOTE — Patient Instructions (Addendum)
Medication Instructions:  Your physician recommends that you continue on your current medications as directed. Please refer to the Current Medication list given to you today.   Labwork: None ordered   Testing/Procedures: None ordered   Follow-Up: Your physician wants you to follow-up in: 12 months with Dr Allred You will receive a reminder letter in the mail two months in advance. If you don't receive a letter, please call our office to schedule the follow-up appointment.   Remote monitoring is used to monitor your Pacemaker  from home. This monitoring reduces the number of office visits required to check your device to one time per year. It allows us to keep an eye on the functioning of your device to ensure it is working properly. You are scheduled for a device check from home on 05/14/16. You may send your transmission at any time that day. If you have a wireless device, the transmission will be sent automatically. After your physician reviews your transmission, you will receive a postcard with your next transmission date.     Any Other Special Instructions Will Be Listed Below (If Applicable).     If you need a refill on your cardiac medications before your next appointment, please call your pharmacy.   

## 2016-02-15 ENCOUNTER — Ambulatory Visit: Payer: Medicare Other

## 2016-02-15 ENCOUNTER — Ambulatory Visit (INDEPENDENT_AMBULATORY_CARE_PROVIDER_SITE_OTHER): Payer: Medicare Other | Admitting: *Deleted

## 2016-02-15 DIAGNOSIS — I4891 Unspecified atrial fibrillation: Secondary | ICD-10-CM

## 2016-02-15 LAB — POCT INR: INR: 2.5

## 2016-02-16 ENCOUNTER — Encounter: Payer: PPO | Admitting: Internal Medicine

## 2016-02-29 ENCOUNTER — Ambulatory Visit: Payer: Medicare Other

## 2016-03-06 ENCOUNTER — Other Ambulatory Visit: Payer: Self-pay | Admitting: Family Medicine

## 2016-03-12 ENCOUNTER — Telehealth: Payer: Self-pay | Admitting: Family Medicine

## 2016-03-12 NOTE — Telephone Encounter (Signed)
Son brought in form from [employer] to be completed by Dr Randolm Idol.  This is regarding her continued enrollment in the Medical Alert Program.  Please fax the form when complete

## 2016-03-13 NOTE — Telephone Encounter (Signed)
Form completed. Will place in front office for pickup. Please inform patient that available to pick up (can not fax as the patient did not complete/sign her portion of the paperwork).

## 2016-03-13 NOTE — Telephone Encounter (Signed)
Spoke with patient, she will come by and sign forms and have [place] fax them.

## 2016-03-13 NOTE — Telephone Encounter (Signed)
Pt will be by tomorrow. Fleeger, Maryjo Rochester, CMA

## 2016-03-14 ENCOUNTER — Other Ambulatory Visit: Payer: Self-pay | Admitting: Family Medicine

## 2016-03-27 ENCOUNTER — Ambulatory Visit (INDEPENDENT_AMBULATORY_CARE_PROVIDER_SITE_OTHER): Payer: Medicare Other | Admitting: *Deleted

## 2016-03-27 ENCOUNTER — Ambulatory Visit (INDEPENDENT_AMBULATORY_CARE_PROVIDER_SITE_OTHER): Payer: Medicare Other | Admitting: Family Medicine

## 2016-03-27 VITALS — BP 106/70 | HR 73 | Ht 62.0 in | Wt 216.0 lb

## 2016-03-27 DIAGNOSIS — I4891 Unspecified atrial fibrillation: Secondary | ICD-10-CM

## 2016-03-27 DIAGNOSIS — L03119 Cellulitis of unspecified part of limb: Secondary | ICD-10-CM

## 2016-03-27 LAB — POCT INR: INR: 3.3

## 2016-03-27 MED ORDER — FUROSEMIDE 10 MG/ML IJ SOLN: 40.0000 mg | Freq: Once | INTRAMUSCULAR | Status: DC

## 2016-03-27 MED ORDER — CLINDAMYCIN HCL 300 MG PO CAPS: 300.0000 mg | ORAL_CAPSULE | Freq: Three times a day (TID) | ORAL | Status: DC

## 2016-03-27 MED ORDER — FUROSEMIDE 10 MG/ML IJ SOLN: 20.0000 mg | Freq: Once | INTRAMUSCULAR | Status: DC

## 2016-03-27 MED ORDER — FUROSEMIDE 10 MG/ML IJ SOLN
40.0000 mg | Freq: Once | INTRAMUSCULAR | Status: DC
  Administered 2016-03-27: 40 mg via INTRAMUSCULAR

## 2016-03-27 MED ORDER — FUROSEMIDE 10 MG/ML IJ SOLN
40.0000 mg | Freq: Once | INTRAMUSCULAR | Status: AC
  Administered 2016-03-27: 20 mg via INTRAVENOUS

## 2016-03-27 NOTE — Progress Notes (Signed)
LEG SWELLING Patient is here with a complaint of right ankle/shin swelling, erythema, and discomfort. Patient states that over the past 2 weeks both of her legs have had increased swelling and discomfort. She is also noted some significant draining from both legs with the right being worse than the left. The erythema of the right leg only recently began to worsen. She is currently worried about infection. She denies any fever, chills, headache, shortness of breath, nausea, vomiting, diarrhea.  Having leg swelling for 14 days. Location: Bilateral Timing of swelling: Gradual New medications: No History of Kidney problems: No History of Heart problems: Yes History of Liver problems: No History of cancer: No  Symptoms Chest pain: No Shortness of breath: No Immobility: Mild Black or bloody bowel movements: No Severe snoring or day time sleepiness: Yes Abdomen swelling: No History of blood clots: No but on warfarin Weight Loss: No  Patient believes may be caused by an infection  ROS see HPI Smoking Status noted  CC, SH/smoking status, and VS noted  Objective: BP 106/70 mmHg  Pulse 73  Ht 5\' 2"  (1.575 m)  Wt 216 lb (97.977 kg)  BMI 39.50 kg/m2 Gen: NAD, alert, cooperative, and pleasant. CV: RRR, no murmur Resp: CTAB, no wheezes, non-labored Ext: Bilateral +2 pitting edema to the tibial tuberosities. Redness of the anterior right shin 10-12 cm diameter. Nonpurulent/serous fluid drainage noted from 2-3 sites within this erythema. No calf tenderness, pulses present bilaterally with adequate capillary refill. Sensation intact throughout. Strength 5/5 throughout. Neuro: Alert and oriented, Speech clear, No gross deficits  Assessment and plan:  Cellulitis Patient is here with complaints of redness and swelling of her right lower leg. Signs and symptoms concerning for possible cellulitis secondary to peripheral edema/venous insufficiency. Patient had been treated within the past 12  months for a similar issue involving the opposite, left, leg. Although the affected tissue is of obvious concern I believe that the primary cause behind this issue is fluid overload. Patient denies any chest pain or worsening dyspnea. Will treat for soft tissue cellulitis and fluid overload with close follow-up with PCP. - Clindamycin 3 times a day 7 days - IM Lasix 60 mg once in clinic - I've asked patient to increase her home Lasix dose to 80 mg twice a day (up from 80 mg once in morning and 40 mg at night); 5 days. - Due to concerns for hypotension I have asked patient to half her ACE inhibitor dose while on the increased Lasix dose (5 days) - Close follow-up with patient's PCP to be scheduled early next week. (Patient stated her understanding)   Precepted with Dr. Lum Babe   Meds ordered this encounter  Medications  . clindamycin (CLEOCIN) 300 MG capsule    Sig: Take 1 capsule (300 mg total) by mouth 3 (three) times daily.    Dispense:  21 capsule    Refill:  0  . DISCONTD: furosemide (LASIX) injection 40 mg    Sig:   . DISCONTD: furosemide (LASIX) injection 20 mg    Sig:   . furosemide (LASIX) injection 40 mg    Sig:   . furosemide (LASIX) injection 40 mg    Sig:      Kathee Delton, MD,MS,  PGY2 03/27/2016 7:09 PM

## 2016-03-27 NOTE — Assessment & Plan Note (Addendum)
Patient is here with complaints of redness and swelling of her right lower leg. Signs and symptoms concerning for possible cellulitis secondary to peripheral edema/venous insufficiency. Patient had been treated within the past 12 months for a similar issue involving the opposite, left, leg. Although the affected tissue is of obvious concern I believe that the primary cause behind this issue is fluid overload. Patient denies any chest pain or worsening dyspnea. Will treat for soft tissue cellulitis and fluid overload with close follow-up with PCP. - Clindamycin 3 times a day 7 days - IM Lasix 60 mg once in clinic - I've asked patient to increase her home Lasix dose to 80 mg twice a day (up from 80 mg once in morning and 40 mg at night); 5 days. - Due to concerns for hypotension I have asked patient to half her ACE inhibitor dose while on the increased Lasix dose (5 days) - Close follow-up with patient's PCP to be scheduled early next week. (Patient stated her understanding)   Precepted with Dr. Lum Babe

## 2016-03-27 NOTE — Patient Instructions (Addendum)
It was a pleasure seeing you today in our clinic. Today we discussed your left shin swelling and redness. Here is the treatment plan we have discussed and agreed upon together:   - I have placed an order for clindamycin. Take 1 tablet 3 times a day for the next 7 days. - For the next 5 days I would like for you to take a full tablet (80 mg) of Lasix at night as well as in the morning. - Take a half tablet of your enalapril every day for the next 5 days. - I would like for you to take your blood pressure at home. If your blood pressures dropped below 90/60 or you begin to develop lightheadedness, nausea, vomiting, headache, or feeling as if you may faint then call our office and go back to your original medication regimen. - I would like for you to be seen in our office in 7 days -- preferably by Dr. Randolm Idol.

## 2016-04-02 ENCOUNTER — Encounter: Payer: Self-pay | Admitting: Family Medicine

## 2016-04-02 ENCOUNTER — Ambulatory Visit (INDEPENDENT_AMBULATORY_CARE_PROVIDER_SITE_OTHER): Payer: Medicare Other | Admitting: *Deleted

## 2016-04-02 ENCOUNTER — Ambulatory Visit (INDEPENDENT_AMBULATORY_CARE_PROVIDER_SITE_OTHER): Payer: Medicare Other | Admitting: Family Medicine

## 2016-04-02 VITALS — BP 99/74 | HR 70 | Temp 98.1°F

## 2016-04-02 DIAGNOSIS — I4891 Unspecified atrial fibrillation: Secondary | ICD-10-CM | POA: Diagnosis not present

## 2016-04-02 DIAGNOSIS — I872 Venous insufficiency (chronic) (peripheral): Secondary | ICD-10-CM | POA: Diagnosis not present

## 2016-04-02 DIAGNOSIS — Z79899 Other long term (current) drug therapy: Secondary | ICD-10-CM

## 2016-04-02 LAB — BASIC METABOLIC PANEL WITH GFR
BUN: 23 mg/dL (ref 7–25)
CALCIUM: 9.5 mg/dL (ref 8.6–10.4)
CHLORIDE: 100 mmol/L (ref 98–110)
CO2: 28 mmol/L (ref 20–31)
Creat: 0.77 mg/dL (ref 0.50–0.99)
GFR, Est African American: >89 mL/min (ref >=60)
GFR, Est Non African American: 81 mL/min (ref >=60)
Glucose, Bld: 86 mg/dL (ref 65–99)
Potassium: 3.7 mmol/L (ref 3.5–5.3)
SODIUM: 138 mmol/L (ref 135–146)

## 2016-04-02 LAB — POCT INR: INR: 2.2

## 2016-04-02 NOTE — Progress Notes (Signed)
   HPI  CC: Follow-up on cellulitis/Lower extremity edema/venous insufficiency Patient is here for follow-up on her lower extremity edema with associated concern for cellulitis. Patient endorses good compliance with medication regimen laid out at the previous visit.She states that she has had significant urine output  Since the increase in her twice daily Lasix dosing. She is now back to her original, reduced, daily dosing of Lasix. She endorses relief in the swelling in her legs. She states that she has less pain/discomfort when she did at the prior visit.The weeping lesions which had been noted at the previous visit are no longer draining. She denies any fevers, chills, lightheadedness, shortness of breath, chest pain, abdominal pain,nausea, vomiting, diarrhea, or dysuria.  Review of Systems   See HPI for ROS. All other systems reviewed and are negative.  CC, SH/smoking status, and VS noted  Objective: BP 99/74 mmHg  Pulse 70  Temp(Src) 98.1 F (36.7 C) (Oral) Gen: NAD, alert, cooperative, and pleasant. CV: RRR, no murmur Resp: CTAB, no wheezes, non-labored Ext: Bilateral +1-2 pitting edema to the tibial tuberosities (greatly improved from previous exam). Redness of the anterior right shin is greatly improved but still present. Pervious serous fluid drainage no longer present. No calf tenderness, pulses present bilaterally with adequate capillary refill. Sensation intact throughout. Strength 5/5 throughout. Neuro: Alert and oriented, Speech clear, No gross deficits  Assessment and plan:  Chronic venous insufficiency Improving Patient presented last week with worsening bilateral  Pitting edema to her knees. Diuretics were increased and patient was sent home with precautions. Follow-up today, one week later, yielded significantly improved symptoms and physical exam. However, symptoms still present. - Increase Lasix dose back to 80 mg twice a day. (Patient may require this dose as her new  long-term dosing). Patient is to continue this dose until the follow-up appointment. - BMP today to assess lytes status. - Follow-up in week.    Orders Placed This Encounter  Procedures  . BASIC METABOLIC PANEL WITH GFR    Kathee Delton, MD,MS,  PGY2 04/02/2016 5:30 PM

## 2016-04-02 NOTE — Patient Instructions (Signed)
It was a pleasure seeing you today in our clinic. Today we discussed Your leg swelling. Here is the treatment plan we have discussed and agreed upon together:   - I would like for you to continue taking the full tablet, 80 mg, of Lasix twice a day, every day until your next appointment. - Try to avoid your total daily sodium (salt) intake. - I would like to obtain a blood lab on you today. This is to make sure that your medications are not causing any significant adverse side effects.

## 2016-04-02 NOTE — Assessment & Plan Note (Signed)
Improving Patient presented last week with worsening bilateral  Pitting edema to her knees. Diuretics were increased and patient was sent home with precautions. Follow-up today, one week later, yielded significantly improved symptoms and physical exam. However, symptoms still present. - Increase Lasix dose back to 80 mg twice a day. (Patient may require this dose as her new long-term dosing). Patient is to continue this dose until the follow-up appointment. - BMP today to assess lytes status. - Follow-up in week.

## 2016-04-10 ENCOUNTER — Other Ambulatory Visit: Payer: Self-pay | Admitting: Family Medicine

## 2016-04-12 ENCOUNTER — Ambulatory Visit (INDEPENDENT_AMBULATORY_CARE_PROVIDER_SITE_OTHER): Payer: Medicare Other | Admitting: *Deleted

## 2016-04-12 ENCOUNTER — Encounter: Payer: Self-pay | Admitting: Family Medicine

## 2016-04-12 ENCOUNTER — Ambulatory Visit (INDEPENDENT_AMBULATORY_CARE_PROVIDER_SITE_OTHER): Payer: Medicare Other | Admitting: Family Medicine

## 2016-04-12 VITALS — BP 88/49 | HR 70 | Temp 98.2°F | Ht 62.0 in | Wt 198.0 lb

## 2016-04-12 DIAGNOSIS — I872 Venous insufficiency (chronic) (peripheral): Secondary | ICD-10-CM | POA: Diagnosis not present

## 2016-04-12 DIAGNOSIS — I10 Essential (primary) hypertension: Secondary | ICD-10-CM

## 2016-04-12 DIAGNOSIS — I482 Chronic atrial fibrillation: Secondary | ICD-10-CM | POA: Diagnosis not present

## 2016-04-12 DIAGNOSIS — I4821 Permanent atrial fibrillation: Secondary | ICD-10-CM

## 2016-04-12 LAB — POCT INR: INR: 2.6

## 2016-04-12 MED ORDER — ENALAPRIL MALEATE 2.5 MG PO TABS: 2.5000 mg | ORAL_TABLET | Freq: Every day | ORAL | Status: DC

## 2016-04-12 NOTE — Assessment & Plan Note (Signed)
Rate controlled. INR therapeutic. -continue Warfarin/BB/Digoxin

## 2016-04-12 NOTE — Progress Notes (Deleted)
Subjective:     Patient ID: Misty Gray, female   DOB: November 30, 1951, 65 y.o.   MRN: [id number]  HPI   Misty Gray is a 65 y.o. Female with a history of CHF who presents for follow-up for leg swelling and right leg cellulitis.   Acute concerns:  Right leg cellulitis: Lower extremity cellulitis of right leg appears to have resolved. It started 3 weeks ago after an accidental dog scratch. She endorses pain to touch near purulent wound site and says activity involving standing or walking causes pain to her leg. She was started on antibiotics 2 weeks ago and finished treatment. She is concerned because her leg still looks a little red. She had cellulitis in her left leg in 2016 but received treatment. She says her left leg feels fine. She denies fever, nausea, vomiting, diarrhea.   Leg swelling: She says both her legs are swollen. Denies using compression stockings since they are prohibitively expensive. She has a history of CHF but takes all of her medications.   Hypotension: Her blood pressure was 88/49 today which she says is normal for her. She says she sometimes feels lightheaded when she stands up. She is taking blood pressure medications for her heart and history of CHF.   Review of Systems Normal other than stated in HPI     Objective:   Physical Exam Filed Vitals:   04/12/16 1142  BP: 88/49  Pulse: 70  Temp: 98.2 F (36.8 C)  Blood pressure 88/49, pulse 70, temperature 98.2 F (36.8 C), temperature source Oral, height 5\' 2"  (1.575 m), weight 198 lb (89.812 kg).  HEENT: MMM. Pink conjunctivae Pulm: Clear to auscultation bilaterally. No increased work of breathing.  Skin: Purulent ulcer proximal and medial to ankle on lower leg. Mild LE erythema and 2+ edema bilaterally.     Assessment:     Misty Gray is a 65 y.o. Female with a history of CHF who presents for follow-up for leg swelling concerning for venous insufficiency and CHF given history of CHF, age, obesity, and LE edema on  exam.  Right leg cellulitis appears to have resolved with residual erythema consistent with chronic venous insufficiency. Hypotension is concerning for medicine-induced hypotension given current BP treatment for CHF.  Otherwise appears healthy and non-distressed.    Plan:     Right leg cellulitis:  -Recommend continued observation and treatment of leg swelling with compression stockings to prevent future recurrence of infection.  Leg swelling:  -Recommend compression stockings to address swelling.   Hypotension:  -Asymptomatic. Recommend decreasing Enalapril to 2.5 mg daily.  -Unsure if whether to stop BB/ACE-I/Spironolactone due to history of CHF.

## 2016-04-12 NOTE — Progress Notes (Signed)
   Subjective:    Patient ID: Misty Gray, female    DOB: Apr 05, 1951, 65 y.o.   MRN: [id number]  HPI Misty Gray is a 65 y.o. Female with a history of CHF who presents for follow-up for leg swelling and right leg cellulitis.   Acute concerns:  Right leg cellulitis: Lower extremity cellulitis of right leg appears to have resolved. It started 3 weeks ago after an accidental dog scratch. She endorses pain to touch near purulent wound site and says activity involving standing or walking causes pain to her leg. She was started on antibiotics (Clindamycin) 2 weeks ago and finished treatment. She is concerned because her leg still looks a little red. She had cellulitis in her left leg in 2016 but received treatment. She says her left leg feels fine. She denies fever, nausea, vomiting, diarrhea.   Leg swelling: She says both her legs are swollen. Denies using compression stockings since they are prohibitively expensive. She has a history of CHF but takes all of her medications. Previously given ACE bandages but does not wear either.   Hypertension/CHF: Currently on Digoxin, Enalapril, Toprol, and Spironolactone.  She says she sometimes feels lightheaded when she stands up. No chest pain. Reports compliance with medications.   Social - nonsmoker  Review of Systems  Constitutional: Negative for fever and chills.  Respiratory: Negative for cough and shortness of breath.   Cardiovascular: Negative for chest pain.       Objective:   Physical Exam BP 88/49 mmHg  Pulse 70  Temp(Src) 98.2 F (36.8 C) (Oral)  Ht 5\' 2"  (1.575 m)  Wt 198 lb (89.812 kg)  BMI 36.21 kg/m2  Gen: pleasant female, NAD Cardiac: Irregular Irregular rate and rhythm, no murmur, no heaves/thrills Resp: normal effort, CTAB Ext: 3+ edema of both legs, weeping wound on right medial leg (no surrounding warmth/erythema), chronic skin changes related to venous insufficiency, varicose veins present on both extremities        Assessment & Plan:  Chronic venous insufficiency Leg swelling improved with Laix 160 mg daily. Cellulitis resolved. Patient does still having draining wound on medial aspect of right leg. -continue Lasix 160 mg daily -encouraged elevation -patient not able to afford compression stockings, encouraged support hose/ACE bandage as tolerated  Hypertension Patient hypotensive. Relative asymptomatic. -decrease Enalapril to 2.5 mg daily -hesitant to stop BB/ACE-I/Spironolactone due to history of CHF  Atrial fibrillation Rate controlled. INR therapeutic. -continue Warfarin/BB/Digoxin

## 2016-04-12 NOTE — Assessment & Plan Note (Signed)
Patient hypotensive. Relative asymptomatic. -decrease Enalapril to 2.5 mg daily -hesitant to stop BB/ACE-I/Spironolactone due to history of CHF

## 2016-04-12 NOTE — Patient Instructions (Addendum)
It was nice to see you today.  Leg swelling - keep feet elevated, continue Lasix 80 mg two tablets in AM, please try to apply the ACE bandages as able to tolerate, please attempt to wear long compression socks as tolerated.  INR - in range, continue Coumadin 10 mg daily (RN staff realized that patient had been taking 12.5 M/W/F/Sat and 10mg  other day, RN staff to call patient and tell her to resume this dose)  Low blood pressure - we will decrease Enalapril from 5 mg to 2.5 mg daily  Return for office visit in 2 weeks.

## 2016-04-12 NOTE — Assessment & Plan Note (Signed)
Leg swelling improved with Laix 160 mg daily. Cellulitis resolved. Patient does still having draining wound on medial aspect of right leg. -continue Lasix 160 mg daily -encouraged elevation -patient not able to afford compression stockings, encouraged support hose/ACE bandage as tolerated

## 2016-04-19 ENCOUNTER — Other Ambulatory Visit: Payer: Self-pay | Admitting: Family Medicine

## 2016-04-26 ENCOUNTER — Ambulatory Visit (INDEPENDENT_AMBULATORY_CARE_PROVIDER_SITE_OTHER): Payer: Medicare Other | Admitting: *Deleted

## 2016-04-26 ENCOUNTER — Encounter: Payer: Self-pay | Admitting: Family Medicine

## 2016-04-26 ENCOUNTER — Ambulatory Visit (INDEPENDENT_AMBULATORY_CARE_PROVIDER_SITE_OTHER): Payer: Medicare Other | Admitting: Family Medicine

## 2016-04-26 VITALS — BP 111/71 | HR 76 | Temp 98.1°F | Ht 62.0 in | Wt 199.8 lb

## 2016-04-26 DIAGNOSIS — I872 Venous insufficiency (chronic) (peripheral): Secondary | ICD-10-CM

## 2016-04-26 DIAGNOSIS — I10 Essential (primary) hypertension: Secondary | ICD-10-CM | POA: Diagnosis not present

## 2016-04-26 DIAGNOSIS — L03119 Cellulitis of unspecified part of limb: Secondary | ICD-10-CM

## 2016-04-26 DIAGNOSIS — Z7901 Long term (current) use of anticoagulants: Secondary | ICD-10-CM

## 2016-04-26 DIAGNOSIS — I4891 Unspecified atrial fibrillation: Secondary | ICD-10-CM

## 2016-04-26 DIAGNOSIS — L03116 Cellulitis of left lower limb: Secondary | ICD-10-CM

## 2016-04-26 LAB — POCT INR: INR: 4.7

## 2016-04-26 MED ORDER — CLINDAMYCIN HCL 300 MG PO CAPS: 300.0000 mg | ORAL_CAPSULE | Freq: Four times a day (QID) | ORAL | Status: DC

## 2016-04-26 NOTE — Patient Instructions (Addendum)
Please do NOT take any Coumadin today or tomorrow. After that, please take 10 mg of Coumadin once a day every day starting on Mother's day (Monday).   Please start taking Clindamycin for your leg infection.  I would like you to come back next week for re-evaluation.  Below are instructions on how to use Compression stockings for future use:   How to Use Compression Stockings Compression stockings are elastic socks that squeeze the legs. They help to increase blood flow to the legs, decrease swelling in the legs, and reduce the chance of developing blood clots in the lower legs. Compression stockings are often used by people who:  Are recovering from surgery.  Have poor circulation in their legs.  Are prone to getting blood clots in their legs.  Have varicose veins.  Sit or stay in bed for long periods of time. HOW TO USE COMPRESSION STOCKINGS Before you put on your compression stockings:  Make sure that they are the correct size. If you do not know your size, ask your health care provider.  Make sure that they are clean, dry, and in good condition.  Check them for rips and tears. Do not put them on if they are ripped or torn. Put your stockings on first thing in the morning, before you get out of bed. Keep them on for as long as your health care provider advises. When you are wearing your stockings:  Keep them as smooth as possible. Do not allow them to bunch up. It is especially important to prevent the stockings from bunching up around your toes or behind your knees.  Do not roll the stockings downward and leave them rolled down. This can decrease blood flow to your leg.  Change them right away if they become wet or dirty. When you take off your stockings, inspect your legs and feet. Anything that does not seem normal may require medical attention. Look for:  Open sores.  Red spots.  Swelling. INFORMATION AND TIPS  Do not stop wearing your compression stockings without  talking to your health care provider first.  Wash your stockings everyday with mild detergent in cold or warm water. Do not use bleach. Air-dry your stockings or dry them in a clothes dryer on low heat.  Replace your stockings every 3-6 months.  If skin moisturizing is part of your treatment plan, apply lotion or cream at night so that your skin will be dry when you put on the stockings in the morning. It is harder to put the stockings on when you have lotion on your legs or feet. SEEK MEDICAL CARE IF: Remove your stockings and seek medical care if:  You have a feeling of pins and needles in your feet or legs.  You have any new changes in your skin.  You have skin lesions that are getting worse.  You have swelling or pain that is getting worse. SEEK IMMEDIATE MEDICAL CARE IF:  You have numbness or tingling in your lower legs that does not get better immediately after you take the stockings off.  Your toes or feet become cold and blue.  You develop open sores or red spots on your legs that do not go away.  You see or feel a warm spot on your leg.  You have new swelling or soreness in your leg.  You are short of breath or you have chest pain for no reason.  You have a rapid or irregular heartbeat.  You feel light-headed or dizzy.  This information is not intended to replace advice given to you by your health care provider. Make sure you discuss any questions you have with your health care provider.   Document Released: 09/29/2009 Document Revised: 04/18/2015 Document Reviewed: 11/09/2014 Elsevier Interactive Patient Education [employer]

## 2016-04-26 NOTE — Assessment & Plan Note (Signed)
Patient has recurrence of bilateral LE cellulitis related to uncontrolled venous insufficiency.  -initiate therapy with Clindamycin 300 mg QID -encouraged continued LE elevation -patient to purchase compression stockings

## 2016-04-26 NOTE — Assessment & Plan Note (Signed)
Both legs show 2+ edema and patient is not using compression stockings.  -Recommend purchasing compression stockings if possible.  -If too expensive, consider indigent funds for purchase aid

## 2016-04-26 NOTE — Progress Notes (Deleted)
Subjective:     Patient ID: Misty Gray, female   DOB: 15-May-1951, 65 y.o.   MRN: [id number]  HPI  Misty Gray is a 65 y.o. Female with CHF and venous insufficiency who presents for follow-up of hypotension, leg cellulitis, and INR monitoring.   Acute concerns:   INR: INR is elevated today at 4.7. She is taking 12.5mg  of Coumadin 4 days a week and 10 mg 3 days a week.   Hypotension: Blood pressure is improved today at 111/71 up from from 88/49 on 04/12/2016. She is taking all of her medications as recommended. Feels fine when she stands up and denies feeling lightheaded or dizzy unless she gets up really fast from bed.  Denies chest pain or headaches.   Leg cellulitis: Complains of new tenderness and redness bilaterally. She says she recently noticed new wounds on both legs. Denies fever, nausea, vomiting, or diarrhea.   Venous insufficiency: Still not using compression stockings. Says they are too expensive at the moment.  She is keeping her legs elevated during rest.   Review of Systems Normal other than stated in HPI    Objective:   Physical Exam Filed Vitals:   04/26/16 1108  BP: 111/71  Pulse: 76  Temp: 98.1 F (36.7 C)  Blood pressure 111/71, pulse 76, temperature 98.1 F (36.7 C), temperature source Oral, height 5\' 2"  (1.575 m), weight 199 lb 12.8 oz (90.629 kg).  Gen: Conversant, Oriented to time and place, pleasant  Pulm: Clear to auscultation bilaterally, no increased work of breathing CV: Irregular heart beat possibly VBP heard on exam. 2+ Bilateral LE edema  Skin: Bilateral LE erythema with focal lower right leg erythema surrounding two anterior lesions covered with dry exudate. No foot ulcers.   INR: 4.7      Assessment and Plan:     Misty Gray is a 65 y.o. Female with CHF and venous insufficiency who presents for follow-up of hypotension, leg cellulitis, and INR monitoring.   Hypotension: BP is well controlled today at 111/71. Likely medication induced  hypotension and has since improved with changes to BP medicine.  - Continue taking BP medication at recommended dosage  INR: INR is elevated at 4.7 likely due to increased Coumadin regimen.  -Recommend decreasing dose to 10 mg of Coumadin 7 days a week.  -Have patient return in one week to re-evaluate INR  Leg Cellulitis: New erythema in LE bilaterally concerning for cellulitis given tenderness, erythema, past history of LE cellulitis, and venous insufficiency.  -Recommend Clindamycin 300 mg 4 times a day  -Have patient return in one week for re-evaluation  Venous Insufficiency: Both legs show 2+ edema and patient is not using compression stockings.  -Recommend purchasing compression stockings if possible.  -If too expensive, consider clinic funds for purchase aid

## 2016-04-26 NOTE — Assessment & Plan Note (Signed)
INR is elevated at 4.7, Currently on 10 mg four days per week, 12.5 mg 3 days per week. -Hold for 2 days then decrease dose to 10 mg of Coumadin 7 days a week.  -Have patient return in one week to re-evaluate INR

## 2016-04-26 NOTE — Progress Notes (Signed)
   Subjective:    Patient ID: Misty Gray, female    DOB: 02/17/1951, 65 y.o.   MRN: [id number]  HPI Mrs. Levering is a 65 y.o. Female with CHF and venous insufficiency who presents for follow-up of hypotension, leg cellulitis, and INR monitoring.   Acute concerns:   INR: INR is elevated today at 4.7. She is taking 12.5mg  of Coumadin 4 days a week and 10 mg 3 days a week.   Hypotension: Blood pressure is improved today at 111/71 up from from 88/49 on 04/12/2016. She is taking all of her medications as recommended. Feels fine when she stands up and denies feeling lightheaded or dizzy unless she gets up really fast from bed.  Denies chest pain or headaches.   Leg cellulitis: Complains of new tenderness and redness bilaterally. She says she recently noticed new wounds on both legs. Denies fever, nausea, vomiting, or diarrhea.   Venous insufficiency: Still not using compression stockings. Says they are too expensive at the moment.  She is keeping her legs elevated during rest.  Social - nonsmoker   Review of Systems  Constitutional: Negative for fever, chills and fatigue.  Respiratory: Negative for cough and shortness of breath.   Cardiovascular: Positive for leg swelling. Negative for chest pain.       Objective:   Physical Exam BP 111/71 mmHg  Pulse 76  Temp(Src) 98.1 F (36.7 C) (Oral)  Ht 5\' 2"  (1.575 m)  Wt 199 lb 12.8 oz (90.629 kg)  BMI 36.53 kg/m2  Gen: Conversant, Oriented to time and place, pleasant  Pulm: Clear to auscultation bilaterally, no increased work of breathing CV: Irregular Irregular rhythm, 2+ Bilateral LE edema  Skin: Bilateral LE erythema with focal lower right leg erythema surrounding two anterior lesions covered with dry exudate. No foot ulcers.    Right LE   Left LE   INR 4.7     Assessment & Plan:  Hypertension BP is well controlled today at 111/71. Previous hypotension likely medication induced and has since improved with changes to BP  medicine (decreased Enalapril to 2.5 mg daily).  - Continue taking BP medication at recommended dosage   Cellulitis Patient has recurrence of bilateral LE cellulitis related to uncontrolled venous insufficiency.  -initiate therapy with Clindamycin 300 mg QID -encouraged continued LE elevation -patient to purchase compression stockings  Chronic venous insufficiency Both legs show 2+ edema and patient is not using compression stockings.  -Recommend purchasing compression stockings if possible.  -If too expensive, consider indigent funds for purchase aid  Chronic anticoagulation INR is elevated at 4.7, Currently on 10 mg four days per week, 12.5 mg 3 days per week. -Hold for 2 days then decrease dose to 10 mg of Coumadin 7 days a week.  -Have patient return in one week to re-evaluate INR

## 2016-04-26 NOTE — Assessment & Plan Note (Signed)
BP is well controlled today at 111/71. Previous hypotension likely medication induced and has since improved with changes to BP medicine (decreased Enalapril to 2.5 mg daily).  - Continue taking BP medication at recommended dosage

## 2016-04-30 ENCOUNTER — Ambulatory Visit: Payer: Medicare Other | Admitting: Family Medicine

## 2016-04-30 ENCOUNTER — Ambulatory Visit: Payer: Medicare Other

## 2016-05-02 ENCOUNTER — Other Ambulatory Visit: Payer: Self-pay | Admitting: Family Medicine

## 2016-05-13 ENCOUNTER — Other Ambulatory Visit: Payer: Self-pay | Admitting: Family Medicine

## 2016-05-14 ENCOUNTER — Ambulatory Visit (INDEPENDENT_AMBULATORY_CARE_PROVIDER_SITE_OTHER): Payer: Medicare Other | Admitting: *Deleted

## 2016-05-14 DIAGNOSIS — I495 Sick sinus syndrome: Secondary | ICD-10-CM | POA: Diagnosis not present

## 2016-05-14 DIAGNOSIS — Z950 Presence of cardiac pacemaker: Secondary | ICD-10-CM

## 2016-05-15 NOTE — Progress Notes (Signed)
Remote pacemaker transmission.   

## 2016-05-30 LAB — CUP PACEART REMOTE DEVICE CHECK
Battery Remaining Longevity: 74 mo
Battery Remaining Percentage: 73 %
Date Time Interrogation Session: 20170530073642
Implantable Lead Implant Date: 19920327
Implantable Lead Location: 753860
Lead Channel Pacing Threshold Amplitude: 1 V
Lead Channel Sensing Intrinsic Amplitude: 6.3 mV
Lead Channel Setting Sensing Sensitivity: 1.5 mV
MDC IDC LEAD IMPLANT DT: 19920327
MDC IDC LEAD LOCATION: 753859
MDC IDC MSMT BATTERY VOLTAGE: 2.92 V
MDC IDC MSMT LEADCHNL RV IMPEDANCE VALUE: 490 Ohm
MDC IDC MSMT LEADCHNL RV PACING THRESHOLD PULSEWIDTH: 0.8 ms
MDC IDC PG SERIAL: 7254513
MDC IDC SET LEADCHNL RV PACING AMPLITUDE: 2.5 V
MDC IDC SET LEADCHNL RV PACING PULSEWIDTH: 0.8 ms
MDC IDC STAT BRADY RV PERCENT PACED: 83 %

## 2016-06-04 ENCOUNTER — Encounter: Payer: Self-pay | Admitting: Cardiology

## 2016-06-14 ENCOUNTER — Telehealth: Payer: Self-pay | Admitting: *Deleted

## 2016-06-14 NOTE — Telephone Encounter (Signed)
Craig Staggers, NP from [employer] called to report that patient's right leg has four spots that are oozing really bad.  Patient completed a course of antibiotics a couple of days ago.  Patient does not have a fever; no acute infection noted per Nichi.  Precept with Dr. Randolm Idol; ok to be seen on Monday, if patient start with fever or if signs of infection is noted patient to go to ED.  Patient might not have co-pay at time of visit on Monday.  Note placed on appointment to see patient without co-pay. Patient is aware that she will be billed for co-pay.  Appointment scheduled for Monday 06/17/16 at 9 AM.  Clovis Pu, RN

## 2016-06-17 ENCOUNTER — Ambulatory Visit (INDEPENDENT_AMBULATORY_CARE_PROVIDER_SITE_OTHER): Payer: Medicare Other | Admitting: *Deleted

## 2016-06-17 ENCOUNTER — Telehealth: Payer: Self-pay | Admitting: Family Medicine

## 2016-06-17 ENCOUNTER — Other Ambulatory Visit: Payer: Self-pay | Admitting: Family Medicine

## 2016-06-17 ENCOUNTER — Ambulatory Visit (INDEPENDENT_AMBULATORY_CARE_PROVIDER_SITE_OTHER): Payer: Medicare Other | Admitting: Family Medicine

## 2016-06-17 ENCOUNTER — Encounter: Payer: Self-pay | Admitting: Family Medicine

## 2016-06-17 VITALS — BP 99/78 | HR 68 | Temp 97.6°F | Wt 206.0 lb

## 2016-06-17 DIAGNOSIS — L03115 Cellulitis of right lower limb: Secondary | ICD-10-CM

## 2016-06-17 DIAGNOSIS — L03116 Cellulitis of left lower limb: Secondary | ICD-10-CM

## 2016-06-17 DIAGNOSIS — I872 Venous insufficiency (chronic) (peripheral): Secondary | ICD-10-CM | POA: Diagnosis not present

## 2016-06-17 DIAGNOSIS — I4891 Unspecified atrial fibrillation: Secondary | ICD-10-CM | POA: Diagnosis not present

## 2016-06-17 LAB — POCT INR: INR: 3.4

## 2016-06-17 MED ORDER — CLINDAMYCIN HCL 300 MG PO CAPS: 300.0000 mg | ORAL_CAPSULE | Freq: Four times a day (QID) | ORAL | Status: DC

## 2016-06-17 NOTE — Telephone Encounter (Signed)
RN called it in.  Erasmo Downer, MD, MPH PGY-3,  [facility] 06/17/2016 12:39 PM

## 2016-06-17 NOTE — Telephone Encounter (Signed)
RN Staff - please call in Phenobarbital 32.4 mg two tablets BID, dispense #120, refill #5, thanks

## 2016-06-17 NOTE — Telephone Encounter (Signed)
Patient's son asks PCP [facility] Please, follow up

## 2016-06-17 NOTE — Assessment & Plan Note (Addendum)
Right leg consistent with cellulitis related to chronic venous insufficiency Course of clindamycin 7 days Clean dressing changes twice daily Obtain compression socks as below Follow-up in 3-4 days to ensure improvement and no worsening Return precautions including fevers, nausea, vomiting discussed with patient

## 2016-06-17 NOTE — Telephone Encounter (Signed)
Dr Randolm Idol to fax Rx.  Erasmo Downer, MD, MPH PGY-3,  [facility] 06/17/2016 1:53 PM

## 2016-06-17 NOTE — Patient Instructions (Signed)
Cellulitis °Cellulitis is an infection of the skin and the tissue under the skin. The infected area is usually red and tender. This happens most often in the arms and lower legs. °HOME CARE  °· Take your antibiotic medicine as told. Finish the medicine even if you start to feel better. °· Keep the infected arm or leg raised (elevated). °· Put a warm cloth on the area up to 4 times per day. °· Only take medicines as told by your doctor. °· Keep all doctor visits as told. °GET HELP IF: °· You see red streaks on the skin coming from the infected area. °· Your red area gets bigger or turns a dark color. °· Your bone or joint under the infected area is painful after the skin heals. °· Your infection comes back in the same area or different area. °· You have a puffy (swollen) bump in the infected area. °· You have new symptoms. °· You have a fever. °GET HELP RIGHT AWAY IF:  °· You feel very sleepy. °· You throw up (vomit) or have watery poop (diarrhea). °· You feel sick and have muscle aches and pains. °  °This information is not intended to replace advice given to you by your health care provider. Make sure you discuss any questions you have with your health care provider. °  °Document Released: 05/20/2008 Document Revised: 08/23/2015 Document Reviewed: 02/17/2012 °Elsevier Interactive Patient Education ©2016 Elsevier Inc. ° °

## 2016-06-17 NOTE — Progress Notes (Addendum)
   Subjective:   Misty Gray is a 65 y.o. female with a history of Chronic venous insufficiency, large Sherman a cellulitis, morbid obesity here for concern for worsening cellulitis.  Patient was seen by PCP on 04/26/16 and diagnosed with cellulitis of the left lower extremity. She was treated with clindamycin at this time. On 06/14/16, nurse practitioner from [facility] noted for oozing areas on the lower extremities and high concern for new cellulitis.  R leg redness - purulent drainage from at least 2 spots that she has noticed - was initially a blister - hasnt noticed change in redness - R leg is more painful than usual - not wearing compression socks - states they are too expensive - She is trying to keep her legs elevated during rest - Denies any fevers, dizziness, nausea, vomiting  Review of Systems:  Per HPI.   Social History: former smoker  Objective:  BP 99/78 mmHg  Pulse 68  Temp(Src) 97.6 F (36.4 C) (Oral)  Wt 206 lb (93.441 kg)  Gen:  65 y.o. female in NAD HEENT: NCAT, MMM, EOMI, PERRL, anicteric sclerae Resp: Non-labored Ext: 2+ edema b/l MSK: No obvious deformities Neuro: Alert and oriented, speech normal Skin: b/l LE erythema R>L, TTP over R anterior and medial leg, focal lower right leg erythema surrounding 2 wounds with purulent exudate, 1 other wound on right lower extremity with no drainage, 1 wound on left posterior medial lower leg with purulent exudate, no fluctuance or induration noted  RLE   LLE      Assessment & Plan:     Misty Gray is a 65 y.o. female here for cellulitis of RLE  Cellulitis of leg, right Right leg consistent with cellulitis related to chronic venous insufficiency Course of clindamycin 7 days Clean dressing changes twice daily Obtain compression socks as below Follow-up in 3-4 days to ensure improvement and no worsening Return precautions including fevers, nausea, vomiting discussed with patient  Chronic  venous insufficiency Both legs with 2+ edema Patient is not using compression stockings as they are too expensive $20 from indigent fund given today to purchase compression stockings       Erasmo Downer, MD MPH PGY-3,  [facility] 06/17/2016  9:38 AM

## 2016-06-17 NOTE — Assessment & Plan Note (Signed)
Both legs with 2+ edema Patient is not using compression stockings as they are too expensive $20 from indigent fund given today to purchase compression stockings

## 2016-06-19 NOTE — Telephone Encounter (Signed)
Rx called into patient pharmacy. 

## 2016-06-20 ENCOUNTER — Telehealth: Payer: Self-pay | Admitting: Family Medicine

## 2016-06-20 ENCOUNTER — Ambulatory Visit: Payer: Medicare Other | Admitting: Family Medicine

## 2016-06-20 NOTE — Telephone Encounter (Signed)
I faxed an order yesterday for compression stockings. [facility].

## 2016-06-20 NOTE — Telephone Encounter (Signed)
Needs new RX for compression stockings sent to [facility].

## 2016-06-21 ENCOUNTER — Ambulatory Visit (INDEPENDENT_AMBULATORY_CARE_PROVIDER_SITE_OTHER): Payer: Medicare Other | Admitting: Family Medicine

## 2016-06-21 VITALS — BP 98/73 | HR 142 | Temp 97.5°F | Wt 204.0 lb

## 2016-06-21 DIAGNOSIS — L03115 Cellulitis of right lower limb: Secondary | ICD-10-CM

## 2016-06-21 DIAGNOSIS — I872 Venous insufficiency (chronic) (peripheral): Secondary | ICD-10-CM | POA: Diagnosis not present

## 2016-06-21 MED ORDER — DOXYCYCLINE HYCLATE 100 MG PO TABS: 100.0000 mg | ORAL_TABLET | Freq: Two times a day (BID) | ORAL | Status: DC

## 2016-06-21 NOTE — Assessment & Plan Note (Signed)
Appears to have worsened in last 4 days Possible resistance to Clinda - d/c Start doxycycline 100mg  BID x10d F/u in 3-4 days Recheck INR at that time No systemic symptoms, so no cultures or CBC at this time Return precautions - systemic symptoms and worsening discussed with patient

## 2016-06-21 NOTE — Assessment & Plan Note (Signed)
Rx for compression stocking given Patient to return receipt

## 2016-06-21 NOTE — Patient Instructions (Signed)

## 2016-06-21 NOTE — Progress Notes (Signed)
   Subjective:   Misty Gray is a 65 y.o. female with a history of Chronic venous stasis, cellulitis here for Cellulitis follow-up  Seen 7/3 for cellulitis of the right lower  Extremity. Started on clindamycin.. Patient reports good compliance  With her antibiotic  Without complication. She is noted ongoing purulent  Drainage from the right leg. Her son has noticed worsening of the redness and increased tenderness.  She denies fevers, chills, N/V, decreased appetite.  She has not yet picked up compression stockings.    Review of Systems:  Per HPI.   Social History: former smoker  Objective:  BP 98/73 mmHg  Pulse 142  Temp(Src) 97.5 F (36.4 C) (Oral)  Wt 204 lb (92.534 kg)  SpO2 100%  Gen:  65 y.o. female in NAD HEENT: NCAT, MMM, EOMI, PERRL, anicteric sclerae CV: RRR, no MRG Resp: Non-labored, CTAB, no wheezes noted Ext: warm, erythematous, multiple oozing areas, TTP b/l LEs  RLE   LLE  MSK: Full ROM, strength intact Neuro: Alert and oriented, speech normal    Assessment & Plan:     Misty Gray is a 65 y.o. female here for cellulitis f/u  Chronic venous insufficiency Rx for compression stocking given Patient to return receipt  Cellulitis of leg, right Appears to have worsened in last 4 days Possible resistance to Clinda - d/c Start doxycycline 100mg  BID x10d F/u in 3-4 days Recheck INR at that time No systemic symptoms, so no cultures or CBC at this time Return precautions - systemic symptoms and worsening discussed with patient    Erasmo Downer, MD MPH PGY-3,  [facility] 06/21/2016  4:26 PM

## 2016-06-21 NOTE — Telephone Encounter (Signed)
[facility] and [facility] have [facility] the order. Misty Gray Bruna Potter, CMA

## 2016-06-21 NOTE — Telephone Encounter (Signed)
Spoke with pt and she said she will call them to check and see if they received fax. Deseree Bruna Potter, CMA

## 2016-06-25 ENCOUNTER — Encounter: Payer: Self-pay | Admitting: Internal Medicine

## 2016-06-25 ENCOUNTER — Ambulatory Visit (INDEPENDENT_AMBULATORY_CARE_PROVIDER_SITE_OTHER): Payer: Medicare Other | Admitting: Internal Medicine

## 2016-06-25 ENCOUNTER — Ambulatory Visit (INDEPENDENT_AMBULATORY_CARE_PROVIDER_SITE_OTHER): Payer: Medicare Other | Admitting: *Deleted

## 2016-06-25 VITALS — BP 104/76 | HR 76 | Temp 98.0°F | Ht 62.0 in | Wt 206.8 lb

## 2016-06-25 DIAGNOSIS — Z7901 Long term (current) use of anticoagulants: Secondary | ICD-10-CM

## 2016-06-25 DIAGNOSIS — L03115 Cellulitis of right lower limb: Secondary | ICD-10-CM

## 2016-06-25 DIAGNOSIS — I4891 Unspecified atrial fibrillation: Secondary | ICD-10-CM

## 2016-06-25 LAB — POCT INR: INR: 2.8

## 2016-06-25 NOTE — Assessment & Plan Note (Signed)
Improving after beginning doxycycline 4 days ago. As subjectively and objectively improving and with no systemic symptoms, no lab work indicated today.  - Continue doxy to complete 10 day course - F/u if symptoms worsen or have fevers/chills - F/u in one month to monitor improvement

## 2016-06-25 NOTE — Patient Instructions (Addendum)
It was nice meeting you today Ms. Spina!  Please continue to take your antibiotics until they are finished. If you feel that your wounds are getting worse, or if you start to have fevers or chills, please call the office. We will see you back in one month to make sure your legs have healed.  If you have any questions or concerns, please feel free to call the clinic.   Be well,  Dr. Natale Milch

## 2016-06-25 NOTE — Progress Notes (Signed)
   Subjective:    Patient ID: Misty Gray, female    DOB: May 07, 1951, 65 y.o.   MRN: [id number]  HPI  Patient presents for follow-up of LE cellulitis.   She was evaluated by Dr. Beryle Flock 4 days ago, at which time she was transitioned from cipro to doxycycline for cellulitis treatment. Since beginning doxy, she feels that her symptoms have greatly improved. She no longer has a burning sensation in her legs, and reports there is less weeping and discharge from her wounds. She denies fevers, chills, nausea, vomiting, abdominal pain. Denies difficulty ambulating. She has finally received her compression stockings and has been wearing these. She is accompanied by her son, who feels that she is improving as well.   Review of Systems See HPI.     Objective:   Physical Exam  Constitutional: She is oriented to person, place, and time. She appears well-developed and well-nourished. No distress.  HENT:  Head: Normocephalic and atraumatic.  Musculoskeletal:  5/5 strength LE bilaterally  Neurological: She is alert and oriented to person, place, and time.  Skin:  Erythema of bilateral LE below the knee with multiple healing lesions. No purulence or discharge of the lesions. No TTP. 1+ pitting edema to knees bilaterally. Chronic venous stasis changes of both extremities below the knee.   Psychiatric: She has a normal mood and affect. Her behavior is normal.      Assessment & Plan:  Cellulitis of leg, right Improving after beginning doxycycline 4 days ago. As subjectively and objectively improving and with no systemic symptoms, no lab work indicated today.  - Continue doxy to complete 10 day course - F/u if symptoms worsen or have fevers/chills - F/u in one month to monitor improvement    Tarri Abernethy, MD, MPH PGY-2 [facility] Pager [phone]

## 2016-06-27 ENCOUNTER — Ambulatory Visit: Payer: Medicare Other

## 2016-07-01 ENCOUNTER — Other Ambulatory Visit: Payer: Self-pay | Admitting: Family Medicine

## 2016-07-02 ENCOUNTER — Other Ambulatory Visit: Payer: Self-pay | Admitting: *Deleted

## 2016-07-02 NOTE — Telephone Encounter (Signed)
Needs refill going on vacation tomorrow. Deseree Bruna Potter, CMA

## 2016-07-08 ENCOUNTER — Ambulatory Visit (INDEPENDENT_AMBULATORY_CARE_PROVIDER_SITE_OTHER): Payer: Medicare Other | Admitting: *Deleted

## 2016-07-08 DIAGNOSIS — I4891 Unspecified atrial fibrillation: Secondary | ICD-10-CM

## 2016-07-08 LAB — POCT INR: INR: 1.7

## 2016-07-16 ENCOUNTER — Other Ambulatory Visit: Payer: Self-pay | Admitting: Family Medicine

## 2016-07-22 ENCOUNTER — Encounter: Payer: Self-pay | Admitting: *Deleted

## 2016-07-22 ENCOUNTER — Ambulatory Visit (INDEPENDENT_AMBULATORY_CARE_PROVIDER_SITE_OTHER): Payer: Medicare Other | Admitting: *Deleted

## 2016-07-22 VITALS — BP 98/64 | Ht 61.0 in | Wt 216.6 lb

## 2016-07-22 DIAGNOSIS — Z1159 Encounter for screening for other viral diseases: Secondary | ICD-10-CM | POA: Diagnosis not present

## 2016-07-22 DIAGNOSIS — Z1211 Encounter for screening for malignant neoplasm of colon: Secondary | ICD-10-CM

## 2016-07-22 DIAGNOSIS — I4891 Unspecified atrial fibrillation: Secondary | ICD-10-CM | POA: Diagnosis not present

## 2016-07-22 DIAGNOSIS — Z23 Encounter for immunization: Secondary | ICD-10-CM

## 2016-07-22 DIAGNOSIS — Z0000 Encounter for general adult medical examination without abnormal findings: Secondary | ICD-10-CM | POA: Diagnosis not present

## 2016-07-22 LAB — POCT INR: INR: 3.5

## 2016-07-22 NOTE — Patient Instructions (Addendum)
It was a pleasure to meet you today. We will do the Hepatitis C Screening today. Rob will also give you a hemoccult card to take home for your colorectal cancer screening like we discussed. Please come back in a few weeks to get your flu shot.  Thank you for coming in today. Have a great day!  Lorene Dy

## 2016-07-22 NOTE — Progress Notes (Signed)
Subjective:   CARMELINE KOWAL is a 65 y.o. female who presents for an Initial Medicare Annual Wellness Visit with her son Fayrene Fearing.   Review of Systems     Cardiac Risk Factors include: advanced age (>30men, >32 women);obesity (BMI >30kg/m2);sedentary lifestyle     Objective:    Today's Vitals   07/22/16 1430  BP: 98/64  Weight: 216 lb 9.6 oz (98.2 kg)  Height:  (1.549 m)   Body mass index is 40.93 kg/m.   Current Medications (verified) Outpatient Encounter Prescriptions as of 07/22/2016  Medication Sig  . [facility] VITAMIN D3 1000 units capsule TAKE ONE CAPSULE BY MOUTH EVERY MORNING  . digoxin (LANOXIN) 0.125 MG tablet TAKE 1 TABLET (125 MCG TOTAL) BY MOUTH DAILY.  Marland Kitchen enalapril (VASOTEC) 2.5 MG tablet Take 1 tablet (2.5 mg total) by mouth daily.  . fluticasone (FLONASE) 50 MCG/ACT nasal spray Place 2 sprays into both nostrils daily as needed. For congestion.  . furosemide (LASIX) 80 MG tablet TAKE ONE TABLET BY MOUTH IN THE MORNING AND 1/2 TABLET IN THE EVENING  . metoprolol succinate (TOPROL-XL) 25 MG 24 hr tablet TAKE 1 TABLET BY MOUTH EVERY DAY  . omeprazole (PRILOSEC) 20 MG capsule TAKE ONE CAPSULE BY MOUTH EVERY DAY  . OXYGEN Inhale 2 L into the lungs at bedtime. cpap machine  . PHENobarbital (LUMINAL) 32.4 MG tablet TAKE 2 TABLETS BY MOUTH TWICE A DAY  . spironolactone (ALDACTONE) 25 MG tablet TAKE 1 TABLET (25 MG TOTAL) BY MOUTH DAILY.  Marland Kitchen warfarin (COUMADIN) 5 MG tablet TAKE 2 AND 1/2 TABS MONDAY, WEDNESDAY, FRIDAY,& SATURDAY EVENINGS AND 2 TABS ALL OTHER EVENINGS  . Calcium Citrate-Vitamin D (CALCIUM + D PO) Take 1 tablet by mouth 2 (two) times daily.  Marland Kitchen doxycycline (VIBRA-TABS) 100 MG tablet Take 1 tablet (100 mg total) by mouth 2 (two) times daily. (Patient not taking: Reported on 07/22/2016)  . halobetasol (ULTRAVATE) 0.05 % cream Apply 1 application topically every evening. To legs  . Lactobacillus (ACIDOPHILUS PROBIOTIC) 10 MG TABS Take 10 mg by mouth 3 (three) times  daily. (Patient not taking: Reported on 07/22/2016)  . nystatin cream (MYCOSTATIN) Apply to affected area 2 times daily (Patient not taking: Reported on 07/22/2016)   No facility-administered encounter medications on file as of 07/22/2016.     Allergies (verified) Aspirin; Penicillins; Tape; Wool alcohol [lanolin]; Amoxicillin; and Ampicillin   History: Past Medical History:  Diagnosis Date  . Abnormal CT of the head 12/16/1984   R parietal atrophy  . Allergic rhinitis, cause unspecified   . Anemia   . Arthritis    "hands and knees" (09/06/2015)  . Atrial fibrillation (HCC)   . CHF (congestive heart failure) (HCC)   . Diaphragmatic hernia without mention of obstruction or gangrene   . Exertional dyspnea 06/22/12  . Family history of adverse reaction to anesthesia    "daughter fights w/them; just can't relax during OR; stay awake during the OR"  . Female stress incontinence   . GERD (gastroesophageal reflux disease)   . Grand mal seizure (HCC)   . H/O hiatal hernia    two removed  . Heart murmur   . High cholesterol   . History of blood transfusion 1956   "related to nose bleed"  . Hypertension   . Influenza A 01/11/2014  . Lichenification and lichen simplex chronicus   . Migraine    "when I was a teenager"  . Morbid obesity (HCC)   . Nonischemic  cardiomyopathy (HCC)    EF has normalized-repeat Pending   . On home oxygen therapy    "2L when I'm asleep in bed" (09/06/2015)  . OSA on CPAP   . Pacemaker  st judes    Patient states "this is my third pacemaker"  . Pneumonia 2004; 2011; 11/2011  . Seizures (HCC) since 1981   "can hear you talking but sounds like you are in big tunnel; have them often if not taking RX; last one was 06/17/12" (09/06/2015)  . Sick sinus syndrome (HCC)    WITH PRIOR DDD PACEMAKER IMPLANTATION  . Stroke [facility]) 1988   "mouth drawed real bad on my left side; found out it was a seizure"  . Syncope and collapse 06/22/12   "hit forehead and left knee"; denies loss  of consciousness  . Unspecified venous (peripheral) insufficiency    Past Surgical History:  Procedure Laterality Date  . APPENDECTOMY    . HERNIA REPAIR  ? 1988; 04/15/1998  . INSERT / REPLACE / REMOVE PACEMAKER  12/16/1990   DDD EF 30%;  Marland Kitchen INSERT / REPLACE / REMOVE PACEMAKER  07/25/2003   replaced by BB, leads are both IS1 and from 1992  . INSERT / REPLACE / REMOVE PACEMAKER  05/21/2011   JA; generator change  . LEG SKIN LESION  BIOPSY / EXCISION  05/16/2001   Lichen Planus  . VENTRAL HERNIA REPAIR  1989; 04/15/1998   Family History  Problem Relation Age of Onset  . Stroke Father     died from CAD?  Marland Kitchen Heart disease Father   . Cancer Mother   . Diabetes Son     Type 1  . Sleep apnea Son   . Cancer Sister     cervical  . Hypertension Sister   . Hypertension Brother   . Rheum arthritis Daughter     Also PGM, PGGM   Social History   Occupational History  . disabled    Social History Main Topics  . Smoking status: Former Smoker    Packs/day: 1.00    Years: 7.00    Types: Cigarettes    Quit date: 03/16/1969  . Smokeless tobacco: Never Used  . Alcohol use No  . Drug use: No  . Sexual activity: Not Currently    Birth control/ protection: Post-menopausal    Tobacco Counseling Counseling given: Yes   Activities of Daily Living In your present state of health, do you have any difficulty performing the following activities: 07/22/2016 09/08/2015  Hearing? N N  Vision? N Y  Difficulty concentrating or making decisions? N -  Walking or climbing stairs? Y Y  Dressing or bathing? N N  Doing errands, shopping? N -  Preparing Food and eating ? N -  Using the Toilet? N -  In the past six months, have you accidently leaked urine? N -  Do you have problems with loss of bowel control? N -  Managing your Medications? N -  Managing your Finances? N -  Housekeeping or managing your Housekeeping? N -  Some recent data might be hidden    Immunizations and Health  Maintenance Immunization History  Administered Date(s) Administered  . Influenza Split 09/24/2011, 09/15/2012  . Influenza Whole 10/13/2007  . Influenza,inj,Quad PF,36+ Mos 09/13/2013, 08/29/2014, 09/07/2015  . Pneumococcal Polysaccharide-23 11/16/1999, 08/29/2014  . Td 01/16/1999, 04/18/2009   Health Maintenance Due  Topic Date Due  . Hepatitis C Screening  15-May-1951  . COLONOSCOPY  03/17/2015  . PNA vac Low Risk Adult (  2 of 2 - PCV13) 08/30/2015  . INFLUENZA VACCINE  07/16/2016    Patient Care Team: Uvaldo Rising, MD as PCP - General (Family Medicine) [name], MD (Cardiology)  Indicate any recent Medical Services you may have received from other than [facility] providers in the past year (date may be approximate).     Assessment:   This is a routine wellness examination for [place].   Hearing/Vision screen  Hearing Screening   125Hz  250Hz  500Hz  1000Hz  2000Hz  3000Hz  4000Hz  6000Hz  8000Hz   Right ear:   Fail Fail Fail  40    Left ear:   40 40 40  40      Dietary issues and exercise activities discussed: Current Exercise Habits: The patient does not participate in regular exercise at present, Exercise limited by: Other - see comments;cardiac condition(s) (Reports not liking exercise. Has shortness of breath when stressed on heart.)  Goals    . Elevate Feet Everyday          Her doctor told her to elevate her feet to reduce swelling daily.      Depression Screen PHQ 2/9 Scores 07/22/2016 06/25/2016 04/26/2016 04/12/2016 04/02/2016 11/07/2015 10/27/2015  PHQ - 2 Score 0 0 0 0 0 0 0    Fall Risk Fall Risk  07/22/2016 06/25/2016 04/12/2016 04/02/2016 10/19/2015  Falls in the past year? No No No No No  Risk for fall due to : History of fall(s);Impaired balance/gait - - - -   TUG Test: 15 seconds  Home Safety Natsuko reports the following: Her home has a working smoke alarm. She does not have any throw rugs. Her floors, stairs and hallways are free from clutter, wires and cords All  stairs in her home have railings or bannisters She does not have non-slip maps in the bathtub or shower. Her son will purchase some.  Cognitive Function: MMSE - Mini Mental State Exam 04/25/2011  Orientation to time 5  Orientation to Place 4  Registration 3  Attention/ Calculation 5  Recall 3  Language- name 2 objects 2  Language- repeat 1  Language- follow 3 step command 3  Language- read & follow direction 1  Write a sentence 1  Copy design 0  Total score 28   Mini-Cog: Passed 3/5  Screening Tests Health Maintenance  Topic Date Due  . Hepatitis C Screening  Feb 06, 1951  . COLONOSCOPY  03/17/2015  . PNA vac Low Risk Adult (2 of 2 - PCV13) 08/30/2015  . INFLUENZA VACCINE  07/16/2016  . MAMMOGRAM  12/18/2017  . TETANUS/TDAP  04/19/2019  . DEXA SCAN  Completed  . ZOSTAVAX  Addressed      Plan:  Hep C Screening, Prevnar 13 to be completed today. Azaline will do an FOBT at home and send it back. She will return for her flu shot in a few weeks.  Amorita says she does not enjoy exercise and has no interest in starting an exercise routine. Her goals involve elevating her feet to reduce swelling per her doctor's recommendation.  During the course of the visit, Alannah was educated and counseled about the following appropriate screening and preventive services:   Vaccines to include Pneumoccal, Influenza, Hepatitis B, Td, Zostavax, HCV  Colorectal cancer screening  Bone density screening  Diabetes screening  Glaucoma screening  Mammography/PAP  Nutrition counseling  Patient Instructions (the written plan) were given to the patient.    Haugh, Lorene Dy   07/22/2016

## 2016-07-23 ENCOUNTER — Encounter: Payer: Self-pay | Admitting: Family Medicine

## 2016-07-23 ENCOUNTER — Encounter: Payer: Self-pay | Admitting: *Deleted

## 2016-07-23 LAB — HEPATITIS C ANTIBODY: HCV Ab: NEGATIVE

## 2016-07-23 NOTE — Progress Notes (Signed)
I have reviewed this visit and discussed with Christie Haugh, and agree with her documentation.   Lajune Perine MD 

## 2016-07-31 LAB — POC HEMOCCULT BLD/STL (HOME/3-CARD/SCREEN)
Card #2 Fecal Occult Blod, POC: NEGATIVE
FECAL OCCULT BLD: NEGATIVE
FECAL OCCULT BLD: POSITIVE

## 2016-07-31 NOTE — Addendum Note (Signed)
Addended by: Jennette Bill on: 07/31/2016 04:15 PM   Modules accepted: Orders

## 2016-08-01 ENCOUNTER — Telehealth: Payer: Self-pay | Admitting: Family Medicine

## 2016-08-01 NOTE — Telephone Encounter (Signed)
RN staff - please call patient and inform her that 1/3 stool cards was positive for blood. Please have her schedule an appointment with me to discuss further workup.

## 2016-08-01 NOTE — Telephone Encounter (Signed)
Pt son informed and she is scheduled for an appt. Deseree Bruna Potter, CMA

## 2016-08-05 ENCOUNTER — Ambulatory Visit (INDEPENDENT_AMBULATORY_CARE_PROVIDER_SITE_OTHER): Payer: Medicare Other | Admitting: *Deleted

## 2016-08-05 DIAGNOSIS — I4891 Unspecified atrial fibrillation: Secondary | ICD-10-CM

## 2016-08-05 LAB — POCT INR: INR: 2.6

## 2016-08-09 ENCOUNTER — Other Ambulatory Visit (HOSPITAL_COMMUNITY)
Admission: RE | Admit: 2016-08-09 | Discharge: 2016-08-09 | Disposition: A | Discharge status: Home / Self Care | Payer: Medicare Other | Source: Ambulatory Visit | Attending: Family Medicine | Admitting: Family Medicine

## 2016-08-09 ENCOUNTER — Encounter: Payer: Self-pay | Admitting: Family Medicine

## 2016-08-09 ENCOUNTER — Ambulatory Visit (INDEPENDENT_AMBULATORY_CARE_PROVIDER_SITE_OTHER): Payer: Medicare Other | Admitting: Family Medicine

## 2016-08-09 VITALS — BP 113/81 | HR 85 | Temp 97.8°F | Ht 62.0 in | Wt 227.2 lb

## 2016-08-09 DIAGNOSIS — N898 Other specified noninflammatory disorders of vagina: Secondary | ICD-10-CM

## 2016-08-09 DIAGNOSIS — Z113 Encounter for screening for infections with a predominantly sexual mode of transmission: Secondary | ICD-10-CM | POA: Diagnosis not present

## 2016-08-09 DIAGNOSIS — K921 Melena: Secondary | ICD-10-CM | POA: Diagnosis not present

## 2016-08-09 DIAGNOSIS — I872 Venous insufficiency (chronic) (peripheral): Secondary | ICD-10-CM | POA: Diagnosis not present

## 2016-08-09 DIAGNOSIS — L304 Erythema intertrigo: Secondary | ICD-10-CM | POA: Insufficient documentation

## 2016-08-09 DIAGNOSIS — R195 Other fecal abnormalities: Secondary | ICD-10-CM | POA: Insufficient documentation

## 2016-08-09 DIAGNOSIS — L298 Other pruritus: Secondary | ICD-10-CM | POA: Diagnosis not present

## 2016-08-09 LAB — CBC WITH DIFFERENTIAL/PLATELET
BASOS ABS: 56 {cells}/uL (ref 0–200)
Basophils Relative: 1 %
EOS PCT: 2 %
Eosinophils Absolute: 112 cells/uL (ref 15–500)
HCT: 44 % (ref 35.0–45.0)
HEMOGLOBIN: 14.6 g/dL (ref 11.7–15.5)
LYMPHS PCT: 26 %
Lymphs Abs: 1456 cells/uL (ref 850–3900)
MCH: 31.1 pg (ref 27.0–33.0)
MCHC: 33.2 g/dL (ref 32.0–36.0)
MCV: 93.8 fL (ref 80.0–100.0)
MONOS PCT: 13 %
MPV: 12.3 fL (ref 7.5–12.5)
Monocytes Absolute: 728 cells/uL (ref 200–950)
NEUTROS PCT: 58 %
Neutro Abs: 3248 cells/uL (ref 1500–7800)
PLATELETS: 158 10*3/uL (ref 140–400)
RBC: 4.69 MIL/uL (ref 3.80–5.10)
RDW: 14.5 % (ref 11.0–15.0)
WBC: 5.6 10*3/uL (ref 3.8–10.8)

## 2016-08-09 LAB — POCT WET PREP (WET MOUNT)
CLUE CELLS WET PREP WHIFF POC: NEGATIVE
TRICHOMONAS WET PREP HPF POC: ABSENT

## 2016-08-09 LAB — HEMOCCULT GUIAC POC 1CARD (OFFICE): Fecal Occult Blood, POC: NEGATIVE

## 2016-08-09 MED ORDER — NYSTATIN 100000 UNIT/GM EX POWD: Freq: Four times a day (QID) | CUTANEOUS | 1 refills | Status: DC

## 2016-08-09 NOTE — Assessment & Plan Note (Signed)
Intertrigo of pannus/groin area. -trial of nystatin powder

## 2016-08-09 NOTE — Progress Notes (Signed)
   Subjective:    Patient ID: Misty Gray, female    DOB: 08-Feb-1951, 65 y.o.   MRN: [id number]  HPI  65 y/o female presents for follow up of positive blood tests (FOB cards).   Positive stool cards - one out of three, patient denies blood in stool, no constipation, no straining, no blood on toilet paper, no stomach pain; last colonoscopy was in 2006 (patient reports as normal). No new fatigue or tiredness.   ?yeast infection - vaginal irritation and itching, vaginal discharge, has been applying otc ointment to vaginal area that has not helped. Also reports rash in groin/pannus area.   Leg swelling - increased drainage from anterior left leg, some redness,no fevers  Social -  Review of Systems  Constitutional: Negative for chills and fatigue.  Respiratory: Negative for chest tightness and shortness of breath.   Gastrointestinal: Negative for abdominal pain, constipation, diarrhea, nausea, rectal pain and vomiting.       Objective:   Physical Exam BP 113/81   Pulse 85   Temp 97.8 F (36.6 C) (Oral)   Ht 5\' 2"  (1.575 m)   Wt 227 lb 3.2 oz (103.1 kg)   BMI 41.56 kg/m  Gen: pleasant female, NAD GYN: chaperone present, intertrigo of groin, no vaginal lesions, speculum exam identified copious white discharge, normal appearing cervix, cultures obtained, bimanual exam technically difficult due to obesity however no gross mass observed. Rectal: no external or internal hemorrhoids, no fissures, no gross blood, FOB obtained.  Skin: bilateral 3+ edema, small about of drainage and bulla of right anterior leg, slight erythema, no increased warmth  Cultures and FOB pending at time of patient leaving the office.      Assessment & Plan:  Intertrigo Intertrigo of pannus/groin area. -trial of nystatin powder  Blood in stool Positive (1/3) home FOB tests. No abnormalities seen on rectal exam.  -FOB in office pending -referral to GI for possible colonoscopy  Chronic venous  insufficiency Increased swelling/drainage from left leg. -encouraged continued leg raising -take diuretics as prescribed -apply topical antibiotic ointment (provided Bactroban in office) -if worsens/fevers/incrased redness call to start oral antibiotic  Vaginal discharge Vaginal discharge. -cultures obtained and pending -will call patient with results and treat accordingly.

## 2016-08-09 NOTE — Assessment & Plan Note (Signed)
Positive (1/3) home FOB tests. No abnormalities seen on rectal exam.  -FOB in office pending -referral to GI for possible colonoscopy

## 2016-08-09 NOTE — Patient Instructions (Signed)
It was nice to see you today.  Dr. Randolm Idol will call you with your lab results.  Blood in stool - Dr. Randolm Idol has referred you to the GI doctor for a possible colonoscopy.   Rash in groin - apply Nystatin cream daily.

## 2016-08-09 NOTE — Assessment & Plan Note (Signed)
Vaginal discharge. -cultures obtained and pending -will call patient with results and treat accordingly.

## 2016-08-09 NOTE — Assessment & Plan Note (Signed)
Increased swelling/drainage from left leg. -encouraged continued leg raising -take diuretics as prescribed -apply topical antibiotic ointment (provided Bactroban in office) -if worsens/fevers/incrased redness call to start oral antibiotic

## 2016-08-10 ENCOUNTER — Other Ambulatory Visit: Payer: Self-pay | Admitting: Family Medicine

## 2016-08-12 ENCOUNTER — Encounter: Payer: Self-pay | Admitting: Gastroenterology

## 2016-08-12 ENCOUNTER — Encounter: Payer: Self-pay | Admitting: Family Medicine

## 2016-08-12 ENCOUNTER — Ambulatory Visit (INDEPENDENT_AMBULATORY_CARE_PROVIDER_SITE_OTHER): Payer: Medicare Other | Admitting: Gastroenterology

## 2016-08-12 VITALS — BP 112/74 | HR 76 | Ht 62.0 in | Wt 224.5 lb

## 2016-08-12 DIAGNOSIS — Z1211 Encounter for screening for malignant neoplasm of colon: Secondary | ICD-10-CM

## 2016-08-12 DIAGNOSIS — R195 Other fecal abnormalities: Secondary | ICD-10-CM

## 2016-08-12 DIAGNOSIS — Z0000 Encounter for general adult medical examination without abnormal findings: Secondary | ICD-10-CM | POA: Insufficient documentation

## 2016-08-12 LAB — CERVICOVAGINAL ANCILLARY ONLY
Chlamydia: NEGATIVE
Neisseria Gonorrhea: NEGATIVE

## 2016-08-12 NOTE — Progress Notes (Signed)
08/12/2016 Misty Gray [id number] 07/16/1951   HISTORY OF PRESENT ILLNESS:  This is a 65 year old female who is new to our practice and has been referred to our office by her PCP, Dr. Randolm Idol, for evaluation of Hemoccult-positive stools.  Was heme positive 1 out of 3.  She has multiple medical problems including requiring home oxygen, pacemaker, and chronic anticoagulation with Coumadin. She is not anemic (Hgb 14.6 grams) and denies any sign of overt bleeding. No family history of colon cancer in immediate family members. Previous colonoscopy 10-12 years ago normal.  I discussed the patient and her son regarding Cologuard testing in place of colonoscopy for now. If this is positive then she is agreeable to consider colonoscopy, and if negative then would not need further colorectal cancer screening for 3 years.   Past Medical History:  Diagnosis Date  . Abnormal CT of the head 12/16/1984   R parietal atrophy  . Allergic rhinitis, cause unspecified   . Anemia   . Arthritis    "hands and knees" (09/06/2015)  . Atrial fibrillation (HCC)   . CHF (congestive heart failure) (HCC)   . Diaphragmatic hernia without mention of obstruction or gangrene   . Exertional dyspnea 06/22/12  . Family history of adverse reaction to anesthesia    "daughter fights w/them; just can't relax during OR; stay awake during the OR"  . Female stress incontinence   . GERD (gastroesophageal reflux disease)   . Grand mal seizure (HCC)   . H/O hiatal hernia    two removed  . Heart murmur   . High cholesterol   . History of blood transfusion 1956   "related to nose bleed"  . Hypertension   . Influenza A 01/11/2014  . Lichenification and lichen simplex chronicus   . Migraine    "when I was a teenager"  . Morbid obesity (HCC)   . Nonischemic cardiomyopathy (HCC)    EF has normalized-repeat Pending   . On home oxygen therapy    "2L when I'm asleep in bed" (09/06/2015)  . OSA on CPAP   . Pacemaker  st judes    Patient states "this is my third pacemaker"  . Pneumonia 2004; 2011; 11/2011  . Seizures (HCC) since 1981   "can hear you talking but sounds like you are in big tunnel; have them often if not taking RX; last one was 06/17/12" (09/06/2015)  . Sick sinus syndrome (HCC)    WITH PRIOR DDD PACEMAKER IMPLANTATION  . Stroke [facility]) 1988   "mouth drawed real bad on my left side; found out it was a seizure"  . Syncope and collapse 06/22/12   "hit forehead and left knee"; denies loss of consciousness  . Unspecified venous (peripheral) insufficiency    Past Surgical History:  Procedure Laterality Date  . APPENDECTOMY    . HERNIA REPAIR  ? 1988; 04/15/1998  . INSERT / REPLACE / REMOVE PACEMAKER  12/16/1990   DDD EF 30%;  Marland Kitchen INSERT / REPLACE / REMOVE PACEMAKER  07/25/2003   replaced by BB, leads are both IS1 and from 1992  . INSERT / REPLACE / REMOVE PACEMAKER  05/21/2011   JA; generator change  . LEG SKIN LESION  BIOPSY / EXCISION  05/16/2001   Lichen Planus  . VENTRAL HERNIA REPAIR  1989; 04/15/1998    reports that she quit smoking about 47 years ago. Her smoking use included Cigarettes. She has a 7.00 pack-year smoking history. She has never used smokeless tobacco. She  reports that she does not drink alcohol or use drugs. family history includes Cancer in her mother and sister; Diabetes in her son; Heart disease in her father; Hypertension in her brother and sister; Rheum arthritis in her daughter; Sleep apnea in her son; Stroke in her father. Allergies  Allergen Reactions  . Aspirin Hives and Rash  . Penicillins Rash and Other (See Comments)    Amoxicillin also  . Tape Other (See Comments)    NO PAPER TAPE-MUST BE MEDICAL TAPE  . Wool Alcohol [Lanolin]   . Amoxicillin Rash  . Ampicillin Itching and Rash      Outpatient Encounter Prescriptions as of 08/12/2016  Medication Sig  . [facility] VITAMIN D3 1000 units capsule TAKE ONE CAPSULE BY MOUTH EVERY MORNING  . digoxin (LANOXIN) 0.125 MG tablet TAKE 1 TABLET  (125 MCG TOTAL) BY MOUTH DAILY.  Marland Kitchen. enalapril (VASOTEC) 2.5 MG tablet Take 1 tablet (2.5 mg total) by mouth daily.  . fluticasone (FLONASE) 50 MCG/ACT nasal spray Place 2 sprays into both nostrils daily as needed. For congestion.  . furosemide (LASIX) 80 MG tablet TAKE ONE TABLET BY MOUTH IN THE MORNING AND 1/2 TABLET IN THE EVENING  . metoprolol succinate (TOPROL-XL) 25 MG 24 hr tablet TAKE 1 TABLET BY MOUTH EVERY DAY  . nystatin (MYCOSTATIN/NYSTOP) powder Apply topically 4 (four) times daily.  Marland Kitchen. omeprazole (PRILOSEC) 20 MG capsule TAKE ONE CAPSULE BY MOUTH EVERY DAY  . OXYGEN Inhale 2 L into the lungs at bedtime. cpap machine  . PHENobarbital (LUMINAL) 32.4 MG tablet TAKE 2 TABLETS BY MOUTH TWICE A DAY  . spironolactone (ALDACTONE) 25 MG tablet TAKE 1 TABLET (25 MG TOTAL) BY MOUTH DAILY.  Marland Kitchen. warfarin (COUMADIN) 5 MG tablet TAKE 2 AND 1/2 TABS MONDAY, WEDNESDAY, FRIDAY,& SATURDAY EVENINGS AND 2 TABS ALL OTHER EVENINGS  . [DISCONTINUED] Calcium Citrate-Vitamin D (CALCIUM + D PO) Take 1 tablet by mouth 2 (two) times daily.  . [DISCONTINUED] doxycycline (VIBRA-TABS) 100 MG tablet Take 1 tablet (100 mg total) by mouth 2 (two) times daily.  . [DISCONTINUED] halobetasol (ULTRAVATE) 0.05 % cream Apply 1 application topically every evening. To legs  . [DISCONTINUED] Lactobacillus (ACIDOPHILUS PROBIOTIC) 10 MG TABS Take 10 mg by mouth 3 (three) times daily.   No facility-administered encounter medications on file as of 08/12/2016.      REVIEW OF SYSTEMS  : All other systems reviewed and negative except where noted in the History of Present Illness.   PHYSICAL EXAM: BP 112/74   Pulse 76   Ht 5\' 2"  (1.575 m)   Wt 224 lb 8 oz (101.8 kg)   BMI 41.06 kg/m  General: Well developed white female in no acute distress Head: Normocephalic and atraumatic Eyes:  Sclerae anicteric, conjunctiva pink. Ears: Normal auditory acuity. Lungs: Clear throughout to auscultation Heart: Regular rate and  rhythm Abdomen: Soft, non-distended.  Normal bowel sounds.  Non-tender. Musculoskeletal: Symmetrical with no gross deformities  Skin: No lesions on visible extremities Extremities: No edema  Neurological: Alert oriented x 4, grossly non-focal Psychological:  Alert and cooperative. Normal mood and affect  ASSESSMENT AND PLAN: -65 year old female with Hemoccult-positive stools 1. She has multiple medical problems including requiring home oxygen, pacemaker, and chronic anticoagulation with Coumadin. She is not anemic and denies any sign of overt bleeding. No family history of colon cancer in immediate family members. Previous colonoscopy 10-12 years ago normal.  I discussed the patient and her son regarding Cologuard testing in place of colonoscopy for now. If  this is positive then she is agreeable to consider colonoscopy, and if negative then would not need further colorectal cancer screening for 3 years.  They decided to proceed with Cologuard for now as she wanted to avoid colonoscopy if possible anyway.  CC:  Uvaldo Rising, MD

## 2016-08-12 NOTE — Patient Instructions (Signed)
We have sent your demographic and insurance information to [facility]. They should contact you within the next week regarding your Cologuard (colon cancer screening) test. If you have not heard from them within the next week, please call our office at [phone]. 

## 2016-08-13 ENCOUNTER — Ambulatory Visit (INDEPENDENT_AMBULATORY_CARE_PROVIDER_SITE_OTHER): Payer: Medicare Other | Admitting: *Deleted

## 2016-08-13 DIAGNOSIS — I495 Sick sinus syndrome: Secondary | ICD-10-CM | POA: Diagnosis not present

## 2016-08-13 DIAGNOSIS — Z950 Presence of cardiac pacemaker: Secondary | ICD-10-CM

## 2016-08-14 NOTE — Progress Notes (Signed)
Remote pacemaker transmission.   

## 2016-08-15 ENCOUNTER — Encounter: Payer: Self-pay | Admitting: Cardiology

## 2016-08-15 ENCOUNTER — Other Ambulatory Visit: Payer: Self-pay | Admitting: Family Medicine

## 2016-08-15 NOTE — Telephone Encounter (Signed)
Would like refill on first aid antibotics.  This is for her legs.  [facility] [address]

## 2016-08-20 ENCOUNTER — Ambulatory Visit: Payer: Medicare Other

## 2016-08-21 NOTE — Progress Notes (Signed)
Heme + stool will require recommending colonoscopy. Discussed w/ Ms. Zehr - she will contact and explain to patient - if patient agrees to colonoscopy will schedule at hospital  Iva Boop, MD, [facility]

## 2016-08-22 ENCOUNTER — Telehealth: Payer: Self-pay | Admitting: Gastroenterology

## 2016-08-22 ENCOUNTER — Telehealth: Payer: Self-pay | Admitting: *Deleted

## 2016-08-22 NOTE — Telephone Encounter (Signed)
Called to discuss with patient regarding colonoscopy instead of Cologuard to evaluate heme positive stool as per conversation with Dr. Leone Payor yesterday evening.  Tried home phone x 2 with no answer and no voicemail/machine.  Will see if other staff is able to get in touch with her to discuss.

## 2016-08-22 NOTE — Telephone Encounter (Signed)
Called the patient and advised her that even though we have ordered the Cologuard test, Dr. Leone Payor feels that a colonoscopy may give [place]  more accurate results, given the positive hemoccult results.  She said that she would rather do the Cologuard test.  I again stressed what Dr. Leone Payor said.  She said if the Cologuard test comes back positive then she will be willing to do the colonoscopy.  I passed the staff message with this information to [employer].

## 2016-08-27 ENCOUNTER — Telehealth: Payer: Self-pay | Admitting: *Deleted

## 2016-08-27 NOTE — Telephone Encounter (Signed)
-----  Message from Loralie Champagne, PA-C sent at 08/23/2016  2:10 PM EDT ----- Misty Gray, thank you for contacting her. I called her again today at home and tried to convince her again to do colonoscopy.  I explained to her that even if the Cologuard study is negative then there is still a chance that she has a large polyp or a malignancy in her colon that it may have missed by that test and that the best way to evaluate her heme positive stool is by a colonoscopy. She confirmed to me twice that she understood this, but still declined to do colonoscopy for now. She has the Cologuard kit at home and is going to complete that exam. If it is positive she said she would consider colonoscopy, but "is really trying to avoid it like the plague."  Misty Gray, please save this to her chart.  Thank you,  Jess   ----- Message ----- From: Tonette Bihari, CMA Sent: 08/22/2016   3:21 PM To: Loralie Champagne, PA-C  I called Misty Gray.  I tried to talk her into the colonoscopy but she does not want to do it.  She said she would rather do the Cologuard test.  She said she has not seen blood lately in her stools or the toilet.  I told her that Dr. Carlean Purl thought the colonoscopy might be more accurate but she again does not want to do it.  She said if the Cologard test comes back positive she will then do the colonscopy. I explained that is the process anyway. A positive cologard test means she would have to do the colonoscopy.   Misty Gray ----- Message ----- From: Loralie Champagne, PA-C Sent: 08/22/2016   9:46 AM To: Tonette Bihari, CMA  Misty Gray,  Dr. Carlean Purl spoke with me bout this patient yesterday.  He thinks that she should have colonoscopy instead of Cologuard if she is agreeable.  He thinks this would be best to evaluate the hemoccult positive stool that she had.  If she is agreeable then she would need to be scheduled at [facility] hospital due to oxygen use and I believe that she is on anticoagulation as well that will need to be addressed.   I tried to contact her on her home phone but did not receive an answer and there was no voicemail to leave a message.  Would you mind contacting her and relaying this information to her.  If she has any additional questions/concerns then please let me know.  Thank you,  Jess

## 2016-08-27 NOTE — Telephone Encounter (Signed)
Misty Gray and I tried to convince the patient to have the colonoscopy and gave her valid reasons. She still declined.

## 2016-08-28 ENCOUNTER — Telehealth: Payer: Self-pay | Admitting: Emergency Medicine

## 2016-08-28 NOTE — Telephone Encounter (Signed)
Received notice from [employer] that patient has exceeded her 72 hour sample stability limit. She will be contacted to collect a new cologard sample  

## 2016-08-29 ENCOUNTER — Telehealth: Payer: Self-pay | Admitting: *Deleted

## 2016-08-29 NOTE — Telephone Encounter (Signed)
Received notice from [employer] that patient has exceeded her 72 hour sample stability limit. She will be contacted to collect a new cologard sample

## 2016-08-30 ENCOUNTER — Ambulatory Visit (INDEPENDENT_AMBULATORY_CARE_PROVIDER_SITE_OTHER): Payer: Medicare Other | Admitting: Family Medicine

## 2016-08-30 ENCOUNTER — Encounter: Payer: Self-pay | Admitting: Family Medicine

## 2016-08-30 ENCOUNTER — Ambulatory Visit (INDEPENDENT_AMBULATORY_CARE_PROVIDER_SITE_OTHER): Payer: Medicare Other | Admitting: *Deleted

## 2016-08-30 DIAGNOSIS — L03115 Cellulitis of right lower limb: Secondary | ICD-10-CM

## 2016-08-30 DIAGNOSIS — I4891 Unspecified atrial fibrillation: Secondary | ICD-10-CM

## 2016-08-30 LAB — POCT INR: INR: 2.2

## 2016-08-30 MED ORDER — DOXYCYCLINE HYCLATE 100 MG PO TABS: 100.0000 mg | ORAL_TABLET | Freq: Two times a day (BID) | ORAL | 0 refills | Status: DC

## 2016-08-30 NOTE — Progress Notes (Signed)
   Subjective:    Patient ID: Misty Gray, female    DOB: 11-29-1951, 65 y.o.   MRN: [id number]  HPI 65 y/o female presents for evaluation of leg redness/swelling/weeping.  Legs swelling/redness Gradually worsening over the past few weeks, history of chronic venous insufficiency complicated by heart failure, takes lasix 120 mg daily and reports good urine output, she does not keep her legs elevated as she should, does not wear compression stockings, no fevers,no chills.   Social - nonsmoker  HM - hold on flu vaccine due to acute illness  Review of Systems See above.     Objective:   Physical Exam BP 100/63   Pulse 71   Temp 97.9 F (36.6 C) (Oral)   Ht 5\' 2"  (1.575 m)   Wt 231 lb 6.4 oz (105 kg)   BMI 42.32 kg/m   Gen: pleasant female, NAD Ext: Bilateral leg 2+ edema, erythematous, slight warmth, bullae and weeping present, normal capillary refill present.       Assessment & Plan:  Cellulitis Cellulitis of bilateral LE. Noncompliant with leg elevation and compression.  -start Doxycycline 100 mg BID for 10 days. -Keep feet elevated -continue lasix 120 mg daily -return in 2-3 days for would check -consider adding Unna boots at next visit

## 2016-08-30 NOTE — Assessment & Plan Note (Signed)
Cellulitis of bilateral LE. Noncompliant with leg elevation and compression.  -start Doxycycline 100 mg BID for 10 days. -Keep feet elevated -continue lasix 120 mg daily -return in 2-3 days for would check -consider adding Unna boots at next visit

## 2016-08-30 NOTE — Patient Instructions (Signed)
It was nice to see you today.  Please start Docycycline 100 mg twice daily for 10 days.  Return next week for a check for a check of the wounds. Will need to have unna boots placed. Keep feet elevated when not walking.

## 2016-09-02 ENCOUNTER — Ambulatory Visit (INDEPENDENT_AMBULATORY_CARE_PROVIDER_SITE_OTHER): Payer: Medicare Other | Admitting: Student

## 2016-09-02 ENCOUNTER — Encounter: Payer: Self-pay | Admitting: Student

## 2016-09-02 ENCOUNTER — Ambulatory Visit (INDEPENDENT_AMBULATORY_CARE_PROVIDER_SITE_OTHER): Payer: Medicare Other | Admitting: *Deleted

## 2016-09-02 VITALS — BP 134/96 | HR 77 | Temp 97.9°F | Wt 230.0 lb

## 2016-09-02 DIAGNOSIS — L03119 Cellulitis of unspecified part of limb: Secondary | ICD-10-CM

## 2016-09-02 DIAGNOSIS — I4891 Unspecified atrial fibrillation: Secondary | ICD-10-CM | POA: Diagnosis not present

## 2016-09-02 LAB — POCT INR: INR: 2.7

## 2016-09-02 NOTE — Assessment & Plan Note (Signed)
D3 doxycycline with stable to mildly improving erythema -continue current antibiotic regimen to complete 10 day course - continue lasix, foot elevation - follow up in 1 week to evaluate improvement. Consider unna boots at that time if infection improved

## 2016-09-02 NOTE — Progress Notes (Signed)
   Subjective:    Patient ID: Misty Gray, female    DOB: 11-Aug-1951, 65 y.o.   MRN: [id number]   CC: Follow up rash  HPI: 65 y/o F with PMH of venous insufficiency presents for follow up of bilateral LE cellulitis now on D3 doxycycline  Cellulitis - patient presents with her son who feels the redness on her left leg is improving and the right one is the same - reports compliance with doxycycline regimen (now D3), but did not take it this AM as she was rushing to get here - patient denies fevers, N/V/D - she does reports LE pain with walking but no different from her baseline - denies SOB, chest pain  Smoking status reviewed - does not smoke Review of Systems  Per HPI  Objective:  BP (!) 134/96   Pulse 77   Temp 97.9 F (36.6 C) (Oral)   Wt 230 lb (104.3 kg)   SpO2 95%   BMI 42.07 kg/m  Vitals and nursing note reviewed  General: NAD Cardiac: RRR, no murmur auscultated Respiratory: CTAB, normal effort Extremities: bilateral LE erythema with scattered area of weeping of clear fluid, bullae. Skin mildly warm    Assessment & Plan:    Cellulitis D3 doxycycline with stable to mildly improving erythema -continue current antibiotic regimen to complete 10 day course - continue lasix, foot elevation - follow up in 1 week to evaluate improvement. Consider unna boots at that time if infection improved    Eivan Gallina A. Kennon Rounds MD, MS Family Medicine Resident PGY-3 Pager [phone]

## 2016-09-02 NOTE — Patient Instructions (Signed)
Follow up on 9/25 for cellulitis check If you have fevers, worsening redness of your legs, return to the office sooner Please elevate you legs when sitting If you have questions or concerns, call the office at [phone]

## 2016-09-03 LAB — CUP PACEART REMOTE DEVICE CHECK
Battery Remaining Percentage: 81 %
Battery Voltage: 2.93 V
Implantable Lead Implant Date: 19920327
Implantable Lead Location: 753859
Lead Channel Impedance Value: 480 Ohm
Lead Channel Setting Pacing Amplitude: 2.5 V
Lead Channel Setting Pacing Pulse Width: 0.8 ms
MDC IDC LEAD IMPLANT DT: 19920327
MDC IDC LEAD LOCATION: 753860
MDC IDC MSMT BATTERY REMAINING LONGEVITY: 82 mo
MDC IDC MSMT LEADCHNL RV PACING THRESHOLD AMPLITUDE: 1 V
MDC IDC MSMT LEADCHNL RV PACING THRESHOLD PULSEWIDTH: 0.8 ms
MDC IDC MSMT LEADCHNL RV SENSING INTR AMPL: 5.9 mV
MDC IDC SESS DTM: 20170829142948
MDC IDC SET LEADCHNL RV SENSING SENSITIVITY: 1.5 mV
MDC IDC STAT BRADY RV PERCENT PACED: 83 %
Pulse Gen Model: 2210
Pulse Gen Serial Number: 7254513

## 2016-09-04 ENCOUNTER — Ambulatory Visit: Payer: Medicare Other | Admitting: Family Medicine

## 2016-09-04 NOTE — Progress Notes (Deleted)
   Subjective:   Misty Gray is a 65 y.o. female with a history of *** here for b/l LE cellulitis f/u  *** Cellulitis - patient presents with her son who feels the redness on her left leg is improving and the right one is the same - reports compliance with doxycycline regimen (now D5/10) - patient denies fevers, N/V/D, CP, SOB - she does reports LE pain with walking but no different from her baseline  Review of Systems:  Per HPI.   Social History: *** smoker  Objective:  There were no vitals taken for this visit.  Gen:  65 y.o. female in NAD *** HEENT: NCAT, MMM, EOMI, PERRL, anicteric sclerae CV: RRR, no MRG, no JVD Resp: Non-labored, CTAB, no wheezes noted Abd: Soft, NTND, BS present, no guarding or organomegaly Ext: WWP, no edema MSK: Full ROM, strength intact Neuro: Alert and oriented, speech normal      Chemistry      Component Value Date/Time   NA 138 04/02/2016 1515   K 3.7 04/02/2016 1515   CL 100 04/02/2016 1515   CO2 28 04/02/2016 1515   BUN 23 04/02/2016 1515   CREATININE 0.77 04/02/2016 1515      Component Value Date/Time   CALCIUM 9.5 04/02/2016 1515   ALKPHOS 71 11/07/2015 1210   AST 20 11/07/2015 1210   ALT 15 11/07/2015 1210   BILITOT 0.8 11/07/2015 1210      Lab Results  Component Value Date   WBC 5.6 08/09/2016   HGB 14.6 08/09/2016   HCT 44.0 08/09/2016   MCV 93.8 08/09/2016   PLT 158 08/09/2016   Lab Results  Component Value Date   TSH 0.331 (L) 01/09/2014   Lab Results  Component Value Date   HGBA1C 6.0 (H) 01/09/2014   Assessment & Plan:     Misty Gray is a 65 y.o. female here for ***  No problem-specific Assessment & Plan notes found for this encounter.      Erasmo Downer, MD MPH PGY-3,  [facility] 09/04/2016  1:38 PM

## 2016-09-09 ENCOUNTER — Ambulatory Visit (INDEPENDENT_AMBULATORY_CARE_PROVIDER_SITE_OTHER): Payer: Medicare Other | Admitting: *Deleted

## 2016-09-09 DIAGNOSIS — L97921 Non-pressure chronic ulcer of unspecified part of left lower leg limited to breakdown of skin: Secondary | ICD-10-CM

## 2016-09-09 DIAGNOSIS — Z7901 Long term (current) use of anticoagulants: Secondary | ICD-10-CM

## 2016-09-09 DIAGNOSIS — L03115 Cellulitis of right lower limb: Secondary | ICD-10-CM

## 2016-09-09 DIAGNOSIS — L03119 Cellulitis of unspecified part of limb: Secondary | ICD-10-CM

## 2016-09-09 DIAGNOSIS — L97911 Non-pressure chronic ulcer of unspecified part of right lower leg limited to breakdown of skin: Secondary | ICD-10-CM

## 2016-09-09 LAB — POCT INR: INR: 4.3

## 2016-09-09 NOTE — Progress Notes (Signed)
  Patient was seen in coumadin clinic today and stated she was suppose to see provider to reassess her legs.  Patient had bilateral pitting edema, bilateral lower extremity ulcerations.  The right leg wounds were weeping.  Precept with Dr. Leveda Anna; apply unna boot to right leg and replace dressing to left leg.  Left leg dressing reapplied with Telfa and Kerlix.  Unna boot applied to right leg. Also spoke with Dr. Randolm Idol; patient is coming in for another coumadin checked and nurse visit for unna boot change.  Patient to follow up with [facility].  Clovis Pu, RN

## 2016-09-15 ENCOUNTER — Other Ambulatory Visit: Payer: Self-pay

## 2016-09-15 ENCOUNTER — Encounter (HOSPITAL_COMMUNITY): Payer: Self-pay | Admitting: Emergency Medicine

## 2016-09-15 ENCOUNTER — Emergency Department (HOSPITAL_COMMUNITY)
Admission: EM | Admit: 2016-09-15 | Discharge: 2016-09-15 | Disposition: A | Discharge status: Home / Self Care | Payer: Medicare Other | Attending: Emergency Medicine | Admitting: Emergency Medicine

## 2016-09-15 ENCOUNTER — Emergency Department (HOSPITAL_COMMUNITY): Payer: Medicare Other

## 2016-09-15 DIAGNOSIS — L02419 Cutaneous abscess of limb, unspecified: Secondary | ICD-10-CM

## 2016-09-15 DIAGNOSIS — I5023 Acute on chronic systolic (congestive) heart failure: Secondary | ICD-10-CM | POA: Diagnosis not present

## 2016-09-15 DIAGNOSIS — I110 Hypertensive heart disease with heart failure: Secondary | ICD-10-CM | POA: Diagnosis not present

## 2016-09-15 DIAGNOSIS — Z950 Presence of cardiac pacemaker: Secondary | ICD-10-CM | POA: Diagnosis not present

## 2016-09-15 DIAGNOSIS — L03119 Cellulitis of unspecified part of limb: Secondary | ICD-10-CM

## 2016-09-15 DIAGNOSIS — Z87891 Personal history of nicotine dependence: Secondary | ICD-10-CM | POA: Diagnosis not present

## 2016-09-15 DIAGNOSIS — R071 Chest pain on breathing: Secondary | ICD-10-CM | POA: Diagnosis present

## 2016-09-15 DIAGNOSIS — R600 Localized edema: Secondary | ICD-10-CM | POA: Diagnosis not present

## 2016-09-15 DIAGNOSIS — Z8673 Personal history of transient ischemic attack (TIA), and cerebral infarction without residual deficits: Secondary | ICD-10-CM | POA: Diagnosis not present

## 2016-09-15 DIAGNOSIS — Z7901 Long term (current) use of anticoagulants: Secondary | ICD-10-CM | POA: Diagnosis not present

## 2016-09-15 DIAGNOSIS — I509 Heart failure, unspecified: Secondary | ICD-10-CM | POA: Diagnosis not present

## 2016-09-15 HISTORY — DX: Cutaneous abscess of limb, unspecified: L02.419

## 2016-09-15 HISTORY — DX: Cellulitis of unspecified part of limb: L03.119

## 2016-09-15 LAB — BASIC METABOLIC PANEL
Anion gap: 8 (ref 5–15)
BUN: 21 mg/dL — ABNORMAL HIGH (ref 6–20)
CHLORIDE: 101 mmol/L (ref 101–111)
CO2: 26 mmol/L (ref 22–32)
CREATININE: 1.06 mg/dL — AB (ref 0.44–1.00)
Calcium: 9.2 mg/dL (ref 8.9–10.3)
GFR calc non Af Amer: 53 mL/min — ABNORMAL LOW (ref >60)
Glucose, Bld: 80 mg/dL (ref 65–99)
Potassium: 3.7 mmol/L (ref 3.5–5.1)
Sodium: 135 mmol/L (ref 135–145)

## 2016-09-15 LAB — CBC
HEMATOCRIT: 43.6 % (ref 36.0–46.0)
HEMOGLOBIN: 14.1 g/dL (ref 12.0–15.0)
MCH: 30.5 pg (ref 26.0–34.0)
MCHC: 32.3 g/dL (ref 30.0–36.0)
MCV: 94.4 fL (ref 78.0–100.0)
Platelets: 125 10*3/uL — ABNORMAL LOW (ref 150–400)
RBC: 4.62 MIL/uL (ref 3.87–5.11)
RDW: 14.9 % (ref 11.5–15.5)
WBC: 6.8 10*3/uL (ref 4.0–10.5)

## 2016-09-15 LAB — I-STAT TROPONIN, ED: Troponin i, poc: 0.02 ng/mL (ref 0.00–0.08)

## 2016-09-15 LAB — DIGOXIN LEVEL: DIGOXIN LVL: 0.4 ng/mL — AB (ref 0.8–2.0)

## 2016-09-15 LAB — BRAIN NATRIURETIC PEPTIDE: B Natriuretic Peptide: 1140.6 pg/mL — ABNORMAL HIGH (ref 0.0–100.0)

## 2016-09-15 MED ORDER — FUROSEMIDE 10 MG/ML IJ SOLN
80.0000 mg | Freq: Once | INTRAMUSCULAR | Status: AC
  Administered 2016-09-15: 80 mg via INTRAVENOUS
  Filled 2016-09-15: qty 8

## 2016-09-15 NOTE — ED Triage Notes (Addendum)
Pt reports new onset centralized chest pain with shortness of breath while ambulating to church. Pt reports increased in swelling and weight gain. Pt is being seen by cardiology and PCP.   Pt in afib with history of same. Pt has a pacemaker.

## 2016-09-15 NOTE — ED Notes (Signed)
Pt returned from xray

## 2016-09-15 NOTE — ED Notes (Signed)
Ambulated pt while checking SpO2. O2 levels maintained at 98% except for one short instance where it dropped to 93% for only a few seconds. Possibly a malfunction with oximeter.

## 2016-09-15 NOTE — ED Notes (Signed)
Gave pt. Water per dr.

## 2016-09-15 NOTE — ED Provider Notes (Signed)
[facility]-EMERGENCY DEPT Provider Note   CSN: [id number] Arrival date & time: 09/15/16  1109     History   Chief Complaint Chief Complaint  Patient presents with  . Chest Pain    HPI Misty Gray is a 65 y.o. female.  Patient has a history of atrial fibrillation on Warfarin, CHF with EF 30-35%, HTN, and HLD. She presents with acute onset left sided chest pain.  Her pain began while walking into church this morning.  She describes the pain as Dehaan and non-radiating.  Patient claims her pain worsens with exertion and improved after sitting down to rest.  She has taken advil for her pain without relief.   She also reports increased dyspnea on exertion compared to her baseline.  At home, she is on 2L O2 at night for CHF.  Additionally she reports 2 pillow orthopnea, though this is a chronic issue for her.  She notes bilateral lower extremity edema due to chronic cellulitis. At the time of my encounter, her pain was nearly resolved and her dyspnea was back to baseline.        Past Medical History:  Diagnosis Date  . Abnormal CT of the head 12/16/1984   R parietal atrophy  . Allergic rhinitis, cause unspecified   . Anemia   . Arthritis    "hands and knees" (09/06/2015)  . Atrial fibrillation (HCC)   . CHF (congestive heart failure) (HCC)   . Diaphragmatic hernia without mention of obstruction or gangrene   . Exertional dyspnea 06/22/12  . Family history of adverse reaction to anesthesia    "daughter fights w/them; just can't relax during OR; stay awake during the OR"  . Female stress incontinence   . GERD (gastroesophageal reflux disease)   . Grand mal seizure (HCC)   . H/O hiatal hernia    two removed  . Heart murmur   . High cholesterol   . History of blood transfusion 1956   "related to nose bleed"  . Hypertension   . Influenza A 01/11/2014  . Lichenification and lichen simplex chronicus   . Migraine    "when I was a teenager"  . Morbid obesity (HCC)   . Nonischemic  cardiomyopathy (HCC)    EF has normalized-repeat Pending   . On home oxygen therapy    "2L when I'm asleep in bed" (09/06/2015)  . OSA on CPAP   . Pacemaker  st judes    Patient states "this is my third pacemaker"  . Pneumonia 2004; 2011; 11/2011  . Seizures (HCC) since 1981   "can hear you talking but sounds like you are in big tunnel; have them often if not taking RX; last one was 06/17/12" (09/06/2015)  . Sick sinus syndrome (HCC)    WITH PRIOR DDD PACEMAKER IMPLANTATION  . Stroke [facility]) 1988   "mouth drawed real bad on my left side; found out it was a seizure"  . Syncope and collapse 06/22/12   "hit forehead and left knee"; denies loss of consciousness  . Unspecified venous (peripheral) insufficiency     Patient Active Problem List   Diagnosis Date Noted  . Special screening for malignant neoplasms, colon 08/12/2016  . Heme positive stool 08/09/2016  . Intertrigo 08/09/2016  . Chronic anticoagulation 04/26/2016  . Diarrhea 11/07/2015  . Chronic venous insufficiency   . Cellulitis 09/07/2015  . Cellulitis of leg, right 09/04/2015  . Preventative health care 10/05/2014  . Breast mass, left 10/05/2014  . Vaginal discharge 10/05/2014  . Osteopenia  09/15/2014  . Hypertension 01/04/2014  . Chronic systolic dysfunction of left ventricle 05/05/2013  . At high risk for falls 02/11/2013  . Vitamin D deficiency 07/15/2012  . Atrial fibrillation (HCC) 06/14/2012  . Sinoatrial node dysfunction (HCC) 05/08/2011  . ALLERGIC RHINITIS 05/31/2008  . Morbid obesity (HCC) 02/12/2007  . Venous (peripheral) insufficiency 02/12/2007  . GASTROESOPHAGEAL REFLUX, NO ESOPHAGITIS 02/12/2007  . Female stress incontinence 02/12/2007  . Seizure disorder (HCC) 02/12/2007  . APNEA, SLEEP 02/12/2007    Past Surgical History:  Procedure Laterality Date  . APPENDECTOMY    . HERNIA REPAIR  ? 1988; 04/15/1998  . INSERT / REPLACE / REMOVE PACEMAKER  12/16/1990   DDD EF 30%;  Marland Kitchen INSERT / REPLACE / REMOVE  PACEMAKER  07/25/2003   replaced by BB, leads are both IS1 and from 1992  . INSERT / REPLACE / REMOVE PACEMAKER  05/21/2011   JA; generator change  . LEG SKIN LESION  BIOPSY / EXCISION  05/16/2001   Lichen Planus  . VENTRAL HERNIA REPAIR  1989; 04/15/1998    OB History    No data available       Home Medications    Prior to Admission medications   Medication Sig Start Date End Date Taking? Authorizing Provider  [employer] VITAMIN D3 1000 units capsule TAKE ONE CAPSULE BY MOUTH EVERY MORNING Patient taking differently: TAKE ONE CAPSULE (1000 units) BY MOUTH EVERY MORNING 08/12/16  Yes Uvaldo Rising, MD  digoxin (LANOXIN) 0.125 MG tablet TAKE 1 TABLET (125 MCG TOTAL) BY MOUTH DAILY. 04/19/16  Yes Nestor Ramp, MD  enalapril (VASOTEC) 2.5 MG tablet Take 1 tablet (2.5 mg total) by mouth daily. 04/12/16  Yes Uvaldo Rising, MD  fluticasone (FLONASE) 50 MCG/ACT nasal spray Place 2 sprays into both nostrils daily as needed. For congestion. 04/21/15  Yes Renee A Kuneff, DO  furosemide (LASIX) 80 MG tablet TAKE ONE TABLET BY MOUTH IN THE MORNING AND 1/2 TABLET IN THE EVENING Patient taking differently: TAKE ONE TABLET (80 mg) BY MOUTH IN THE MORNING AND 1/2 TABLET (40 mg) IN THE EVENING 07/16/16  Yes Uvaldo Rising, MD  metoprolol succinate (TOPROL-XL) 25 MG 24 hr tablet TAKE 1 TABLET BY MOUTH EVERY DAY Patient taking differently: TAKE 1 TABLET (25 mg) BY MOUTH EVERY DAY 08/15/16  Yes Uvaldo Rising, MD  nystatin (MYCOSTATIN/NYSTOP) powder Apply topically 4 (four) times daily. 08/09/16  Yes Uvaldo Rising, MD  omeprazole (PRILOSEC) 20 MG capsule TAKE ONE CAPSULE BY MOUTH EVERY DAY Patient taking differently: TAKE ONE CAPSULE (20 mg) BY MOUTH EVERY DAY 05/14/16  Yes Uvaldo Rising, MD  OXYGEN Inhale 2 L into the lungs at bedtime. cpap machine   Yes Historical Provider, MD  PHENobarbital (LUMINAL) 32.4 MG tablet TAKE 2 TABLETS BY MOUTH TWICE A DAY Patient taking differently: TAKE 2 TABLETS (64.8 mg) BY MOUTH TWICE A DAY  06/17/16  Yes Uvaldo Rising, MD  spironolactone (ALDACTONE) 25 MG tablet TAKE 1 TABLET (25 MG TOTAL) BY MOUTH DAILY. 06/17/16  Yes Uvaldo Rising, MD  warfarin (COUMADIN) 5 MG tablet TAKE 2 AND 1/2 TABS MONDAY, WEDNESDAY, FRIDAY,& SATURDAY EVENINGS AND 2 TABS ALL OTHER EVENINGS Patient taking differently: Take 10 mg every day at bedtime 07/02/16  Yes Uvaldo Rising, MD    Family History Family History  Problem Relation Age of Onset  . Stroke Father     died from CAD?  Marland Kitchen Heart disease Father   . Cancer Mother   .  Diabetes Son     Type 1  . Sleep apnea Son   . Cancer Sister     cervical  . Hypertension Sister   . Hypertension Brother   . Rheum arthritis Daughter     Also PGM, PGGM    Social History Social History  Substance Use Topics  . Smoking status: Former Smoker    Packs/day: 1.00    Years: 7.00    Types: Cigarettes    Quit date: 03/16/1969  . Smokeless tobacco: Never Used  . Alcohol use No     Allergies   Aspirin; Penicillins; Tape; Wool alcohol [lanolin]; Amoxicillin; and Ampicillin   Review of Systems Review of Systems  Constitutional: Negative.   HENT: Negative.   Eyes: Negative.   Respiratory: Positive for shortness of breath.   Cardiovascular: Positive for chest pain and leg swelling.  Gastrointestinal: Negative for abdominal pain, nausea and vomiting.  Endocrine: Negative.   Genitourinary: Negative.   Musculoskeletal: Negative.   Skin: Positive for rash (cellulitis).  Neurological: Negative for syncope.  Hematological: Bruises/bleeds easily (On Warfarin).  Psychiatric/Behavioral: Negative.      Physical Exam Updated Vital Signs BP 113/83   Pulse 62   Temp 97.9 F (36.6 C) (Oral)   Resp 20   SpO2 99%   Physical Exam  Constitutional: She is oriented to person, place, and time. She appears well-developed and well-nourished. No distress.  HENT:  Head: Normocephalic and atraumatic.  Eyes: EOM are normal.  Neck: Normal range of motion. Neck supple.    Cardiovascular: Normal rate, normal heart sounds and intact distal pulses.  An irregularly irregular rhythm present.  No murmur heard. Pulmonary/Chest: Effort normal. No respiratory distress. She has no wheezes. She has rales (bilateral lung bases, symmetric). She exhibits no tenderness.  Abdominal: Soft. Bowel sounds are normal. She exhibits no distension. There is no tenderness.  Musculoskeletal: She exhibits edema and tenderness.  Neurological: She is alert and oriented to person, place, and time.  Skin: Skin is warm and dry. She is not diaphoretic. There is erythema (Over bilateral anterior shins).  Psychiatric: She has a normal mood and affect. Her behavior is normal.  Nursing note and vitals reviewed.    ED Treatments / Results  Labs (all labs ordered are listed, but only abnormal results are displayed) Labs Reviewed  BASIC METABOLIC PANEL - Abnormal; Notable for the following:       Result Value   BUN 21 (*)    Creatinine, Ser 1.06 (*)    GFR calc non Af Amer 53 (*)    All other components within normal limits  CBC - Abnormal; Notable for the following:    Platelets 125 (*)    All other components within normal limits  BRAIN NATRIURETIC PEPTIDE - Abnormal; Notable for the following:    B Natriuretic Peptide 1,140.6 (*)    All other components within normal limits  DIGOXIN LEVEL - Abnormal; Notable for the following:    Digoxin Level 0.4 (*)    All other components within normal limits  I-STAT TROPOININ, ED    EKG  EKG Interpretation  Date/Time:  Sunday September 15 2016 11:19:01 EDT Ventricular Rate:  83 PR Interval:    QRS Duration: 98 QT Interval:  360 QTC Calculation: 416 R Axis:   80 Text Interpretation:  Atrial fibrillation Ventricular demand pacing Confirmed by [facility] Serv[facility] MD, Latifah Padin ([id number]) on 09/15/2016 12:27:39 PM       Radiology Dg Chest 2 View  Result Date: 09/15/2016 CLINICAL  DATA:  Congestive heart failure, diabetes EXAM: CHEST  2 VIEW COMPARISON:   None. FINDINGS: Pacemaker noted. Large cardiac silhouette. Small LEFT effusion present. Lungs are hyperinflated. Mild interstitial edema pattern. No pneumothorax. No consolidation. IMPRESSION: LEFT effusion and interstitial edema. Electronically Signed   By: Genevive Bi M.D.   On: 09/15/2016 12:02    Procedures Procedures (including critical care time)  Medications Ordered in ED Medications  furosemide (LASIX) injection 80 mg (80 mg Intravenous Given 09/15/16 1241)     Initial Impression / Assessment and Plan / ED Course  I have reviewed the triage vital signs and the nursing notes.  Pertinent labs & imaging results that were available during my care of the patient were reviewed by me and considered in my medical decision making (see chart for details).  Clinical Course   Pt received 80 mg of lasix IV in the ED and diuresed quite a bit.  She was able to ambulate well with O2 sats staying in the upper 90s.   Pt knows to f/u with her pcp.  She knows to return if worse.  Final Clinical Impressions(s) / ED Diagnoses   Final diagnoses:  Acute on chronic systolic congestive heart failure [facility])    New Prescriptions New Prescriptions   No medications on file     Jacalyn Lefevre, MD 09/15/16 1426

## 2016-09-16 ENCOUNTER — Ambulatory Visit: Payer: Medicare Other

## 2016-09-16 ENCOUNTER — Ambulatory Visit (INDEPENDENT_AMBULATORY_CARE_PROVIDER_SITE_OTHER): Payer: Medicare Other | Admitting: *Deleted

## 2016-09-16 DIAGNOSIS — I872 Venous insufficiency (chronic) (peripheral): Secondary | ICD-10-CM

## 2016-09-16 DIAGNOSIS — L03115 Cellulitis of right lower limb: Secondary | ICD-10-CM

## 2016-09-16 LAB — POCT INR: INR: 3

## 2016-09-16 NOTE — Progress Notes (Signed)
Patient in today for unna boot removal. Unna boot removed from right leg (rt leg with some foul smelling drainage but much less swelling) and PCP came in to look. Per PCP orders profore dressing applied to right leg and unna boot applied to left leg (due to slight swelling and weeping). Patient to return in 1 week to re-assess.

## 2016-09-18 NOTE — Progress Notes (Signed)
   Subjective:    Patient ID: Misty Gray, female    DOB: 1951/10/02, 65 y.o.   MRN: [id number]  HPI 65 y/o female presents for ED follow up and leg check.  ED Follow Up/CHF Exacerbation Seen on 10/1 with increased sob thought to be due to CHF exacerbation, received IV lasix and symptoms improved, discharged home on same dose of Lasix 120 mg daily. She reports 2 pillow orthopnea, uses oxygen concentrator (greater than 52 years old) and she is not able to adjust the amount of oxygen flow when using CPAP at night. More sob than at baseline (especially with ambulation).   Leg swelling/celluliits RN visit on 10/2, replaced Unna boot (Profore) on right LE and placed Unna boot on left LE, right unna boot has started to fall off.   Social - non smoker  Review of Systems  Constitutional: Negative for chills, fatigue and fever.  Respiratory: Positive for shortness of breath. Negative for chest tightness.   Cardiovascular: Positive for leg swelling. Negative for chest pain.  Gastrointestinal: Negative for diarrhea, nausea and vomiting.       Objective:   Physical Exam BP 126/78   Pulse 71   Temp 97.5 F (36.4 C) (Oral)   Wt 239 lb 12.8 oz (108.8 kg)   BMI 43.86 kg/m   Gen: pleasant female, NAD Cardiac: Irregular Irregular, S1 and S2 present, no murmur Resp: CTAB, normal effort Ext: RLE - removed Unna boot, edema improved (now 1+), erythema present with some weeping, no increased warmth; LLE - Unna boot in place  Reviewed CXR from recent ED visit - let effusion present      Assessment & Plan:  Chronic systolic dysfunction of left ventricle ED follow up for CHF exacerbation. Symptoms improved however still having sob with exertion. -increase lasix to 160 mg daily -follow up in one week  Chronic venous insufficiency Swelling/Edema improved with Unna boots (bilaterally). -replaced Right Profore with regular Unna Boot (replace in one week) -left Unna boot in place (replace in one  week)

## 2016-09-19 ENCOUNTER — Encounter: Payer: Self-pay | Admitting: Family Medicine

## 2016-09-19 ENCOUNTER — Ambulatory Visit (INDEPENDENT_AMBULATORY_CARE_PROVIDER_SITE_OTHER): Payer: Medicare Other | Admitting: Family Medicine

## 2016-09-19 DIAGNOSIS — I519 Heart disease, unspecified: Secondary | ICD-10-CM

## 2016-09-19 DIAGNOSIS — I872 Venous insufficiency (chronic) (peripheral): Secondary | ICD-10-CM

## 2016-09-19 NOTE — Assessment & Plan Note (Signed)
ED follow up for CHF exacerbation. Symptoms improved however still having sob with exertion. -increase lasix to 160 mg daily -follow up in one week

## 2016-09-19 NOTE — Assessment & Plan Note (Signed)
Swelling/Edema improved with Unna boots (bilaterally). -replaced Right Profore with regular Unna Boot (replace in one week) -left Unna boot in place (replace in one week)

## 2016-09-19 NOTE — Patient Instructions (Signed)
It was nice to see you today.  Please increase your Lasix to 160 mg (two 80 tablets) daily.   Please come back and see me next week to check your leg swelling.

## 2016-09-23 ENCOUNTER — Ambulatory Visit (INDEPENDENT_AMBULATORY_CARE_PROVIDER_SITE_OTHER): Payer: Medicare Other | Admitting: *Deleted

## 2016-09-23 ENCOUNTER — Ambulatory Visit: Payer: Medicare Other | Admitting: *Deleted

## 2016-09-23 DIAGNOSIS — I872 Venous insufficiency (chronic) (peripheral): Secondary | ICD-10-CM

## 2016-09-23 DIAGNOSIS — I4891 Unspecified atrial fibrillation: Secondary | ICD-10-CM

## 2016-09-23 LAB — POCT INR: INR: 2.6

## 2016-09-23 NOTE — Progress Notes (Signed)
Patient in today for unna boot change, left leg looks slightly better with decreased swelling and no drainage. Right leg still swollen with two open areas actively weeping. Precepted with Dr. Pollie Meyer and Roland Rack boots applied to both lower legs with instructions for patient to follow up in 1 week for re-check.

## 2016-09-26 ENCOUNTER — Ambulatory Visit (INDEPENDENT_AMBULATORY_CARE_PROVIDER_SITE_OTHER): Payer: Medicare Other | Admitting: Family Medicine

## 2016-09-26 ENCOUNTER — Encounter: Payer: Self-pay | Admitting: Family Medicine

## 2016-09-26 VITALS — BP 80/58 | HR 71 | Temp 97.5°F | Ht 62.0 in | Wt 241.0 lb

## 2016-09-26 DIAGNOSIS — L03115 Cellulitis of right lower limb: Secondary | ICD-10-CM | POA: Diagnosis not present

## 2016-09-26 NOTE — Progress Notes (Signed)
   Subjective:   SHRIKA TANNA is a 65 y.o. female with a history of Chronic venous insufficiency, HTN, A. Fib, HFrEF here for CHF exacerbation follow-up  Increased Lasix to 160mg  daily from 120mg  one week ago - has not taken yet today Urinating "really often" Continues to have SOB when walking to and from car Working on getting another O2 machine for home Had Unna boots replaced earlier this week Stable 2 pillow orthopnea Denies chest pain, fevers, chills  Review of Systems:  Per HPI.   Social History: Former smoker  Objective:  BP (!) 80/58   Pulse 71   Temp 97.5 F (36.4 C) (Oral)   Ht 5\' 2"  (1.575 m)   Wt 241 lb (109.3 kg)   SpO2 99%   BMI 44.08 kg/m   Gen:  65 y.o. female in NAD, Sitting comfortably HEENT: NCAT, MMM, anicteric sclerae CV: RRR, no MRG Resp: Non-labored, crackles in bilateral bases, good air movement Abd: Soft, NTND, BS present, no guarding or organomegaly Ext: Una boots in place, 2+ pitting edema noted to the knee bilaterally  MSK: gait intact Neuro: Alert and oriented, speech normal    Assessment & Plan:     ADELYNA MILLIKEN is a 65 y.o. female here for   Chronic systolic dysfunction of left ventricle Symptoms stable with dyspnea with exertion Continues to need additional diuresis as she is up 11 pounds over the last month Continue Lasix 160 mg daily Blood pressure is soft in clinic today, though patient is asymptomatic In setting of low blood pressure and needed additional diuresis, decrease metoprolol to 12.5 mg daily and hold enalapril Follow-up in 4 days - consider switching to torsemide if continues to need significant diuresis at that time  Cellulitis of leg, right Una boots in place Need to be removed and rechecked at next appointment 4 days   Erasmo Downer, MD MPH PGY-3,  [facility] 09/26/2016  10:13 AM

## 2016-09-26 NOTE — Patient Instructions (Signed)
Nice to see you again today. Continue your Lasix at your current dose. Only take half a pill of your metoprolol daily. Stop taking your enalapril until you follow-up with [place] early next week.  Elevate her legs when possible and try to avoid salt and excess water.  Take care,  Dr. Leonard Schwartz

## 2016-09-26 NOTE — Assessment & Plan Note (Signed)
Una boots in place Need to be removed and rechecked at next appointment 4 days

## 2016-09-26 NOTE — Assessment & Plan Note (Signed)
Symptoms stable with dyspnea with exertion Continues to need additional diuresis as she is up 11 pounds over the last month Continue Lasix 160 mg daily Blood pressure is soft in clinic today, though patient is asymptomatic In setting of low blood pressure and needed additional diuresis, decrease metoprolol to 12.5 mg daily and hold enalapril Follow-up in 4 days - consider switching to torsemide if continues to need significant diuresis at that time

## 2016-09-30 ENCOUNTER — Inpatient Hospital Stay (HOSPITAL_COMMUNITY)
Admission: AD | Admit: 2016-09-30 | Discharge: 2016-10-07 | DRG: 287 | Disposition: A | Discharge status: Home / Self Care | Payer: Medicare Other | Source: Ambulatory Visit | Attending: Family Medicine | Admitting: Family Medicine

## 2016-09-30 ENCOUNTER — Inpatient Hospital Stay (HOSPITAL_COMMUNITY): Payer: Medicare Other

## 2016-09-30 ENCOUNTER — Encounter: Payer: Self-pay | Admitting: Family Medicine

## 2016-09-30 ENCOUNTER — Other Ambulatory Visit: Payer: Self-pay

## 2016-09-30 ENCOUNTER — Ambulatory Visit (INDEPENDENT_AMBULATORY_CARE_PROVIDER_SITE_OTHER): Payer: Medicare Other | Admitting: Family Medicine

## 2016-09-30 VITALS — BP 70/58 | HR 141 | Temp 97.7°F | Wt 239.8 lb

## 2016-09-30 DIAGNOSIS — I509 Heart failure, unspecified: Secondary | ICD-10-CM

## 2016-09-30 DIAGNOSIS — L03115 Cellulitis of right lower limb: Secondary | ICD-10-CM

## 2016-09-30 DIAGNOSIS — I429 Cardiomyopathy, unspecified: Secondary | ICD-10-CM | POA: Diagnosis present

## 2016-09-30 DIAGNOSIS — I272 Pulmonary hypertension, unspecified: Secondary | ICD-10-CM | POA: Diagnosis present

## 2016-09-30 DIAGNOSIS — I493 Ventricular premature depolarization: Secondary | ICD-10-CM | POA: Diagnosis present

## 2016-09-30 DIAGNOSIS — Z8673 Personal history of transient ischemic attack (TIA), and cerebral infarction without residual deficits: Secondary | ICD-10-CM | POA: Diagnosis not present

## 2016-09-30 DIAGNOSIS — Z9181 History of falling: Secondary | ICD-10-CM

## 2016-09-30 DIAGNOSIS — Z833 Family history of diabetes mellitus: Secondary | ICD-10-CM

## 2016-09-30 DIAGNOSIS — Z7901 Long term (current) use of anticoagulants: Secondary | ICD-10-CM | POA: Diagnosis not present

## 2016-09-30 DIAGNOSIS — R0600 Dyspnea, unspecified: Secondary | ICD-10-CM | POA: Diagnosis not present

## 2016-09-30 DIAGNOSIS — N179 Acute kidney failure, unspecified: Secondary | ICD-10-CM | POA: Diagnosis present

## 2016-09-30 DIAGNOSIS — E876 Hypokalemia: Secondary | ICD-10-CM | POA: Diagnosis not present

## 2016-09-30 DIAGNOSIS — Z950 Presence of cardiac pacemaker: Secondary | ICD-10-CM

## 2016-09-30 DIAGNOSIS — Z809 Family history of malignant neoplasm, unspecified: Secondary | ICD-10-CM | POA: Diagnosis not present

## 2016-09-30 DIAGNOSIS — L97909 Non-pressure chronic ulcer of unspecified part of unspecified lower leg with unspecified severity: Secondary | ICD-10-CM | POA: Diagnosis not present

## 2016-09-30 DIAGNOSIS — Z9981 Dependence on supplemental oxygen: Secondary | ICD-10-CM

## 2016-09-30 DIAGNOSIS — M8580 Other specified disorders of bone density and structure, unspecified site: Secondary | ICD-10-CM | POA: Diagnosis present

## 2016-09-30 DIAGNOSIS — Z8249 Family history of ischemic heart disease and other diseases of the circulatory system: Secondary | ICD-10-CM

## 2016-09-30 DIAGNOSIS — G4733 Obstructive sleep apnea (adult) (pediatric): Secondary | ICD-10-CM | POA: Diagnosis not present

## 2016-09-30 DIAGNOSIS — I83009 Varicose veins of unspecified lower extremity with ulcer of unspecified site: Secondary | ICD-10-CM | POA: Diagnosis present

## 2016-09-30 DIAGNOSIS — Z87891 Personal history of nicotine dependence: Secondary | ICD-10-CM

## 2016-09-30 DIAGNOSIS — I110 Hypertensive heart disease with heart failure: Secondary | ICD-10-CM | POA: Diagnosis not present

## 2016-09-30 DIAGNOSIS — I5023 Acute on chronic systolic (congestive) heart failure: Secondary | ICD-10-CM | POA: Diagnosis not present

## 2016-09-30 DIAGNOSIS — I872 Venous insufficiency (chronic) (peripheral): Secondary | ICD-10-CM

## 2016-09-30 DIAGNOSIS — K219 Gastro-esophageal reflux disease without esophagitis: Secondary | ICD-10-CM | POA: Diagnosis present

## 2016-09-30 DIAGNOSIS — I482 Chronic atrial fibrillation: Secondary | ICD-10-CM | POA: Diagnosis not present

## 2016-09-30 DIAGNOSIS — R0602 Shortness of breath: Secondary | ICD-10-CM

## 2016-09-30 DIAGNOSIS — Z9119 Patient's noncompliance with other medical treatment and regimen: Secondary | ICD-10-CM

## 2016-09-30 DIAGNOSIS — I495 Sick sinus syndrome: Secondary | ICD-10-CM | POA: Diagnosis present

## 2016-09-30 DIAGNOSIS — I959 Hypotension, unspecified: Secondary | ICD-10-CM | POA: Diagnosis not present

## 2016-09-30 DIAGNOSIS — Z8701 Personal history of pneumonia (recurrent): Secondary | ICD-10-CM

## 2016-09-30 DIAGNOSIS — G40909 Epilepsy, unspecified, not intractable, without status epilepticus: Secondary | ICD-10-CM

## 2016-09-30 DIAGNOSIS — I519 Heart disease, unspecified: Secondary | ICD-10-CM

## 2016-09-30 DIAGNOSIS — Z823 Family history of stroke: Secondary | ICD-10-CM

## 2016-09-30 DIAGNOSIS — I481 Persistent atrial fibrillation: Secondary | ICD-10-CM | POA: Diagnosis not present

## 2016-09-30 HISTORY — DX: Dyspnea, unspecified: R06.00

## 2016-09-30 HISTORY — DX: Cellulitis of unspecified part of limb: L03.119

## 2016-09-30 HISTORY — DX: Cutaneous abscess of limb, unspecified: L02.419

## 2016-09-30 LAB — BASIC METABOLIC PANEL
ANION GAP: 7 (ref 5–15)
BUN: 14 mg/dL (ref 6–20)
CALCIUM: 9.2 mg/dL (ref 8.9–10.3)
CO2: 30 mmol/L (ref 22–32)
Chloride: 101 mmol/L (ref 101–111)
Creatinine, Ser: 0.77 mg/dL (ref 0.44–1.00)
GLUCOSE: 141 mg/dL — AB (ref 65–99)
Potassium: 3.1 mmol/L — ABNORMAL LOW (ref 3.5–5.1)
SODIUM: 138 mmol/L (ref 135–145)

## 2016-09-30 LAB — DIGOXIN LEVEL: DIGOXIN LVL: 0.4 ng/mL — AB (ref 0.8–2.0)

## 2016-09-30 LAB — CBC
HCT: 39.4 % (ref 36.0–46.0)
HEMOGLOBIN: 13.1 g/dL (ref 12.0–15.0)
MCH: 30.5 pg (ref 26.0–34.0)
MCHC: 33.2 g/dL (ref 30.0–36.0)
MCV: 91.8 fL (ref 78.0–100.0)
Platelets: 105 10*3/uL — ABNORMAL LOW (ref 150–400)
RBC: 4.29 MIL/uL (ref 3.87–5.11)
RDW: 15.4 % (ref 11.5–15.5)
WBC: 4.1 10*3/uL (ref 4.0–10.5)

## 2016-09-30 LAB — TROPONIN I
Troponin I: 0.03 ng/mL (ref <0.03)
Troponin I: 0.03 ng/mL (ref <0.03)

## 2016-09-30 LAB — BRAIN NATRIURETIC PEPTIDE: B NATRIURETIC PEPTIDE 5: 927.9 pg/mL — AB (ref 0.0–100.0)

## 2016-09-30 LAB — PROTIME-INR
INR: 3.18
PROTHROMBIN TIME: 33.3 s — AB (ref 11.4–15.2)

## 2016-09-30 MED ORDER — ONDANSETRON HCL 4 MG/2ML IJ SOLN
4.0000 mg | Freq: Four times a day (QID) | INTRAMUSCULAR | Status: DC | PRN
Start: 2016-09-30 — End: 2016-10-07

## 2016-09-30 MED ORDER — ONDANSETRON HCL 4 MG PO TABS
4.0000 mg | ORAL_TABLET | Freq: Four times a day (QID) | ORAL | Status: DC | PRN
Start: 2016-09-30 — End: 2016-10-07

## 2016-09-30 MED ORDER — ACETAMINOPHEN 325 MG PO TABS
650.0000 mg | ORAL_TABLET | Freq: Four times a day (QID) | ORAL | Status: DC | PRN
  Administered 2016-10-04 – 2016-10-06 (×3): 650 mg via ORAL
  Filled 2016-09-30 (×4): qty 2

## 2016-09-30 MED ORDER — ACETAMINOPHEN 650 MG RE SUPP: 650.0000 mg | Freq: Four times a day (QID) | RECTAL | Status: DC | PRN

## 2016-09-30 MED ORDER — POLYETHYLENE GLYCOL 3350 17 G PO PACK: 17.0000 g | PACK | Freq: Every day | ORAL | Status: DC | PRN

## 2016-09-30 MED ORDER — PHENOBARBITAL 32.4 MG PO TABS
64.8000 mg | ORAL_TABLET | Freq: Two times a day (BID) | ORAL | Status: DC
  Administered 2016-09-30 – 2016-10-07 (×14): 64.8 mg via ORAL
  Filled 2016-09-30 (×14): qty 2

## 2016-09-30 MED ORDER — PANTOPRAZOLE SODIUM 40 MG PO TBEC
40.0000 mg | DELAYED_RELEASE_TABLET | Freq: Every day | ORAL | Status: DC
  Administered 2016-10-01 – 2016-10-07 (×7): 40 mg via ORAL
  Filled 2016-09-30 (×7): qty 1

## 2016-09-30 MED ORDER — FLUTICASONE PROPIONATE 50 MCG/ACT NA SUSP
2.0000 | Freq: Every day | NASAL | Status: DC | PRN
  Filled 2016-09-30: qty 16

## 2016-09-30 MED ORDER — DIGOXIN 125 MCG PO TABS
0.1250 mg | ORAL_TABLET | Freq: Every day | ORAL | Status: DC
  Administered 2016-09-30 – 2016-10-05 (×6): 0.125 mg via ORAL
  Filled 2016-09-30 (×6): qty 1

## 2016-09-30 MED ORDER — FUROSEMIDE 10 MG/ML IJ SOLN
40.0000 mg | Freq: Once | INTRAMUSCULAR | Status: AC
  Administered 2016-09-30: 40 mg via INTRAVENOUS
  Filled 2016-09-30: qty 4

## 2016-09-30 MED ORDER — METOPROLOL SUCCINATE ER 25 MG PO TB24
12.5000 mg | ORAL_TABLET | Freq: Every day | ORAL | Status: DC
  Administered 2016-09-30: 12.5 mg via ORAL
  Filled 2016-09-30 (×2): qty 1

## 2016-09-30 NOTE — Progress Notes (Signed)
ANTICOAGULATION CONSULT NOTE - Initial Consult  Pharmacy Consult for Coumadin Indication: atrial fibrillation  Allergies  Allergen Reactions  . Aspirin Hives and Rash  . Penicillins Rash and Other (See Comments)    Amoxicillin also, Has patient had a PCN reaction causing immediate rash, facial/tongue/throat swelling, SOB or lightheadedness with hypotension: Yes Has patient had a PCN reaction causing severe rash involving mucus membranes or skin necrosis: No Has patient had a PCN reaction that required hospitalization No Has patient had a PCN reaction occurring within the last 10 years: Yes If all of the above answers are "NO", then may proceed with Cephalosporin use.   . Tape Other (See Comments)    NO PAPER TAPE-MUST BE MEDICAL TAPE  . Wool Alcohol [Lanolin] Hives  . Amoxicillin Rash  . Ampicillin Itching and Rash    Patient Measurements: Height: 5\' 4"  (162.6 cm) Weight: 238 lb 8 oz (108.2 kg) (scale a) IBW/kg (Calculated) : 54.7  Vital Signs: Temp: 97.5 F (36.4 C) (10/16 1619) Temp Source: Oral (10/16 1619) BP: 97/49 (10/16 1619) Pulse Rate: 82 (10/16 1619)  Labs:  Recent Labs  09/30/16 1606  HGB 13.1  HCT 39.4  PLT PENDING  LABPROT 33.3*  INR 3.18    Estimated Creatinine Clearance: 61.9 mL/min (by C-G formula based on SCr of 1.06 mg/dL (H)).   Medical History: Past Medical History:  Diagnosis Date  . Abnormal CT of the head 12/16/1984   R parietal atrophy  . Allergic rhinitis, cause unspecified   . Anemia   . Arthritis    "hands and knees" (09/06/2015)  . Atrial fibrillation (HCC)   . CHF (congestive heart failure) (HCC)   . Diaphragmatic hernia without mention of obstruction or gangrene   . Exertional dyspnea 06/22/12  . Family history of adverse reaction to anesthesia    "daughter fights w/them; just can't relax during OR; stay awake during the OR"  . Female stress incontinence   . GERD (gastroesophageal reflux disease)   . Grand mal seizure (HCC)    . H/O hiatal hernia    two removed  . Heart murmur   . High cholesterol   . History of blood transfusion 1956   "related to nose bleed"  . Hypertension   . Influenza A 01/11/2014  . Lichenification and lichen simplex chronicus   . Migraine    "when I was a teenager"  . Morbid obesity (HCC)   . Nonischemic cardiomyopathy (HCC)    EF has normalized-repeat Pending   . On home oxygen therapy    "2L when I'm asleep in bed" (09/06/2015)  . OSA on CPAP   . Pacemaker  st judes    Patient states "this is my third pacemaker"  . Pneumonia 2004; 2011; 11/2011  . Seizures (HCC) since 1981   "can hear you talking but sounds like you are in big tunnel; have them often if not taking RX; last one was 06/17/12" (09/06/2015)  . Sick sinus syndrome (HCC)    WITH PRIOR DDD PACEMAKER IMPLANTATION  . Stroke [facility]) 1988   "mouth drawed real bad on my left side; found out it was a seizure"  . Syncope and collapse 06/22/12   "hit forehead and left knee"; denies loss of consciousness  . Unspecified venous (peripheral) insufficiency    Assessment: 65yof on coumadin pta for afib, admitted with acute heart failure. INR on admission slightly above goal at 3.18. CBC wnl.  Home dose: 10mg  daily - last taken 10/15  Goal of Therapy:  INR 2-3 Monitor platelets by anticoagulation protocol: Yes   Plan:  1) No coumadin tonight 2) Daily INR  Fredrik RiggerMarkle, Garyson Stelly Sue 09/30/2016,5:10 PM

## 2016-09-30 NOTE — H&P (Signed)
[facility] History and Physical Service Pager: [phone]  [name] Medical record number: [id number] Date of birth: 04-25-1951 Age: 65 y.o. Gender: female  Primary Care Provider: Uvaldo RisingFLETKE, KYLE, J, MD Consultants: Heart Failure Team Code Status: Full  Chief Complaint: Shortness of breath  Assessment and Plan: Misty Gray is a 65 y.o. female presenting with shortness of breath. PMH is significant for HFrEF (EF 30-35%), A-fib (on Coumadin), HTN, chronic venous insufficiency.  Acute exacerbation of HFrEF: Pt has had SOB for the last two weeks. We have been trying to manage her as an outpatient but she has had worsening dyspnea on exertion. Exam is significant for L>R bibasilar crackles and pitting edema to the knees bilaterally. Last ECHO 12/2013 with EF 30-35%, moderately dilated LV, moderately dilated LA, mildly dilated RA, PA peak pressure 45mmHg. She had a chest x-ray performed 10/1, which showed a small left pleural effusion. She has a pacemaker in place. Cardiologist is Dr. Johney FrameAllred. She denies chest pain, but will need to rule out MI given her cardiac history. - Admit to telemetry, attending Dr. Leveda AnnaHensel. - HF team consult - Will start diuresis with Lasix 40mg  IV x 1 and see how her blood pressures respond - Holding Enalapril and Spironolactone for now. Will await HF recs on these medications. - Continue home Metoprolol succinate 12.5mg  daily and Digoxin 0.125mg  daily - CBC, BMP, BNP pending  - Digoxin level ordered - EKG, trend troponins - CXR ordered - Will repeat ECHO, as she has not had one since 2015. - Daily weights - Strict I/O - O2 therapy prn to keep O2 sats > 92%  Hypotension with history of hypertension: BP 70/58, 90/65 in clinic. Likely related to reduced EF. - Will monitor BPs closely - Holding Enalapril and Spironolactone pending HF team recommendations. - Continue Metoprolol succinate 12.5mg  daily and Digoxin  0.125mg  daily.  Atrial Fibrillation, rate-controlled: HR 70. - Continue Metoprolol succinate and Digoxin - Coumadin per pharm  Chronic Venous Insufficiency: Has been in bilateral unna boots, which were removed in clinic today. Has been treated many times for cellulitis, but I do not think she has active cellulitis at this time. She feels that her venous stasis ulcers are improving with the unna boots. - She will need unna boots replaced prior to discharge  OSA: PA peak pressure 45mmHg on last ECHO - CPAP ordered - She will likely need a new CPAP machine ordered when she is discharged.  FEN/GI: Heart healthy diet Prophylaxis: On Coumadin for A-fib  Disposition: Pending response to IV Lasix. Anticipate discharge home in 3-4 days.  History of Present Illness:  Misty Gray is a 65 y.o. female presenting as a direct admit from clinic with shortness of breath. We have been managing her CHF exacerbation as an outpatient over the last two weeks. On 10/1, she was seen in the ED and was given Lasix 80mg  IV with good diuresis and improvement in SOB. She followed up in our clinic on 10/5, where she continued to have SOB. At that visit, her Lasix was increased from 120mg  daily to 160mg  daily. She was seen for follow-up in clinic on 10/12, where she was noted to have continued shortness of breath as well as hypotension to 80/58. Her Lasix was continued at 160mg  daily and she was told to decrease her Metoprolol succinate from 25mg  daily to 12.5mg  daily and stop taking the Enalapril. She was seen again in clinic today and was noted to  have worsening dyspnea on exertion. Also continued to have hypotension with BP 70/58. She states she has shortness of breath with walking just 5-10 steps. She is normally able to walk much longer than this. She is still sleeping on two pillows, which she has done for a while now. She endorses PND but states this is because her CPAP machine isn't working correctly. She denies cough  or sputum production. She denies chest pain. She states she has been taking her medications as prescribed.  Review Of Systems: Per HPI with the following additions:   Review of Systems  Constitutional: Negative for chills, fever and weight loss.  HENT: Negative for congestion.   Eyes: Negative for blurred vision and double vision.  Respiratory: Positive for shortness of breath. Negative for cough, sputum production and wheezing.   Cardiovascular: Positive for orthopnea, leg swelling and PND. Negative for chest pain and palpitations.  Gastrointestinal: Negative for nausea and vomiting.  Genitourinary: Negative for dysuria and hematuria.  Musculoskeletal: Negative for falls and myalgias.  Skin: Negative for rash.  Neurological: Negative for dizziness, focal weakness and headaches.  Psychiatric/Behavioral: Negative for depression and substance abuse.    Patient Active Problem List   Diagnosis Date Noted  . Special screening for malignant neoplasms, colon 08/12/2016  . Heme positive stool 08/09/2016  . Intertrigo 08/09/2016  . Chronic anticoagulation 04/26/2016  . Diarrhea 11/07/2015  . Chronic venous insufficiency   . Cellulitis 09/07/2015  . Cellulitis of leg, right 09/04/2015  . Preventative health care 10/05/2014  . Breast mass, left 10/05/2014  . Vaginal discharge 10/05/2014  . Osteopenia 09/15/2014  . Hypertension 01/04/2014  . Chronic systolic dysfunction of left ventricle 05/05/2013  . At high risk for falls 02/11/2013  . Vitamin D deficiency 07/15/2012  . Atrial fibrillation (HCC) 06/14/2012  . Sinoatrial node dysfunction (HCC) 05/08/2011  . ALLERGIC RHINITIS 05/31/2008  . Morbid obesity (HCC) 02/12/2007  . Venous (peripheral) insufficiency 02/12/2007  . GASTROESOPHAGEAL REFLUX, NO ESOPHAGITIS 02/12/2007  . Female stress incontinence 02/12/2007  . Seizure disorder (HCC) 02/12/2007  . APNEA, SLEEP 02/12/2007    Past Medical History: Past Medical History:   Diagnosis Date  . Abnormal CT of the head 12/16/1984   R parietal atrophy  . Allergic rhinitis, cause unspecified   . Anemia   . Arthritis    "hands and knees" (09/06/2015)  . Atrial fibrillation (HCC)   . CHF (congestive heart failure) (HCC)   . Diaphragmatic hernia without mention of obstruction or gangrene   . Exertional dyspnea 06/22/12  . Family history of adverse reaction to anesthesia    "daughter fights w/them; just can't relax during OR; stay awake during the OR"  . Female stress incontinence   . GERD (gastroesophageal reflux disease)   . Grand mal seizure (HCC)   . H/O hiatal hernia    two removed  . Heart murmur   . High cholesterol   . History of blood transfusion 1956   "related to nose bleed"  . Hypertension   . Influenza A 01/11/2014  . Lichenification and lichen simplex chronicus   . Migraine    "when I was a teenager"  . Morbid obesity (HCC)   . Nonischemic cardiomyopathy (HCC)    EF has normalized-repeat Pending   . On home oxygen therapy    "2L when I'm asleep in bed" (09/06/2015)  . OSA on CPAP   . Pacemaker  st judes    Patient states "this is my third pacemaker"  . Pneumonia 2004;  2011; 11/2011  . Seizures (HCC) since 1981   "can hear you talking but sounds like you are in big tunnel; have them often if not taking RX; last one was 06/17/12" (09/06/2015)  . Sick sinus syndrome (HCC)    WITH PRIOR DDD PACEMAKER IMPLANTATION  . Stroke [facility]) 1988   "mouth drawed real bad on my left side; found out it was a seizure"  . Syncope and collapse 06/22/12   "hit forehead and left knee"; denies loss of consciousness  . Unspecified venous (peripheral) insufficiency     Past Surgical History: Past Surgical History:  Procedure Laterality Date  . APPENDECTOMY    . HERNIA REPAIR  ? 1988; 04/15/1998  . INSERT / REPLACE / REMOVE PACEMAKER  12/16/1990   DDD EF 30%;  Marland Kitchen INSERT / REPLACE / REMOVE PACEMAKER  07/25/2003   replaced by BB, leads are both IS1 and from 1992  . INSERT  / REPLACE / REMOVE PACEMAKER  05/21/2011   JA; generator change  . LEG SKIN LESION  BIOPSY / EXCISION  05/16/2001   Lichen Planus  . VENTRAL HERNIA REPAIR  1989; 04/15/1998    Social History: Social History  Substance Use Topics  . Smoking status: Former Smoker    Packs/day: 1.00    Years: 7.00    Types: Cigarettes    Quit date: 03/16/1969  . Smokeless tobacco: Never Used  . Alcohol use No   Additional social history: She lives at home with her husband, son, and grandchildren. She does not use a walker or cane at home. Please also refer to relevant sections of EMR.  Family History: Family History  Problem Relation Age of Onset  . Stroke Father     died from CAD?  Marland Kitchen Heart disease Father   . Cancer Mother   . Diabetes Son     Type 1  . Sleep apnea Son   . Cancer Sister     cervical  . Hypertension Sister   . Hypertension Brother   . Rheum arthritis Daughter     Also PGM, PGGM    Allergies and Medications: Allergies  Allergen Reactions  . Aspirin Hives and Rash  . Penicillins Rash and Other (See Comments)    Amoxicillin also, Has patient had a PCN reaction causing immediate rash, facial/tongue/throat swelling, SOB or lightheadedness with hypotension: Yes Has patient had a PCN reaction causing severe rash involving mucus membranes or skin necrosis: No Has patient had a PCN reaction that required hospitalization No Has patient had a PCN reaction occurring within the last 10 years: Yes If all of the above answers are "NO", then may proceed with Cephalosporin use.   . Tape Other (See Comments)    NO PAPER TAPE-MUST BE MEDICAL TAPE  . Wool Alcohol [Lanolin] Hives  . Amoxicillin Rash  . Ampicillin Itching and Rash   No current facility-administered medications on file prior to encounter.    Current Outpatient Prescriptions on File Prior to Encounter  Medication Sig Dispense Refill  . [facility] VITAMIN D3 1000 units capsule TAKE ONE CAPSULE BY MOUTH EVERY MORNING (Patient taking  differently: TAKE ONE CAPSULE (1000 units) BY MOUTH EVERY MORNING) 90 capsule 1  . digoxin (LANOXIN) 0.125 MG tablet TAKE 1 TABLET (125 MCG TOTAL) BY MOUTH DAILY. 90 tablet 1  . enalapril (VASOTEC) 2.5 MG tablet Take 1 tablet (2.5 mg total) by mouth daily. 90 tablet 1  . fluticasone (FLONASE) 50 MCG/ACT nasal spray Place 2 sprays into both nostrils daily as  needed. For congestion. 16 g 1  . furosemide (LASIX) 80 MG tablet TAKE ONE TABLET BY MOUTH IN THE MORNING AND 1/2 TABLET IN THE EVENING (Patient taking differently: TAKE ONE TABLET (80 mg) BY MOUTH IN THE MORNING AND 1/2 TABLET (40 mg) IN THE EVENING) 60 tablet 2  . metoprolol succinate (TOPROL-XL) 25 MG 24 hr tablet TAKE 1 TABLET BY MOUTH EVERY DAY (Patient taking differently: TAKE 1 TABLET (25 mg) BY MOUTH EVERY DAY) 90 tablet 1  . nystatin (MYCOSTATIN/NYSTOP) powder Apply topically 4 (four) times daily. 45 g 1  . omeprazole (PRILOSEC) 20 MG capsule TAKE ONE CAPSULE BY MOUTH EVERY DAY (Patient taking differently: TAKE ONE CAPSULE (20 mg) BY MOUTH EVERY DAY) 90 capsule 1  . OXYGEN Inhale 2 L into the lungs at bedtime. cpap machine    . PHENobarbital (LUMINAL) 32.4 MG tablet TAKE 2 TABLETS BY MOUTH TWICE A DAY (Patient taking differently: TAKE 2 TABLETS (64.8 mg) BY MOUTH TWICE A DAY) 120 tablet 5  . spironolactone (ALDACTONE) 25 MG tablet TAKE 1 TABLET (25 MG TOTAL) BY MOUTH DAILY. 90 tablet 1  . warfarin (COUMADIN) 5 MG tablet TAKE 2 AND 1/2 TABS MONDAY, WEDNESDAY, FRIDAY,& SATURDAY EVENINGS AND 2 TABS ALL OTHER EVENINGS (Patient taking differently: Take 10 mg every day at bedtime) 90 tablet 2    Objective: There were no vitals taken for this visit. Exam: General: Tired-appearing, in NAD Eyes: EOMI, PERRLA, no scleral icterus ENTM: Oropharynx clear, mildly dry mucous membranes Neck: Supple, no masses, no JVD Cardiovascular: Irregularly irregular rhythm, no murmurs Respiratory: Becomes mildly short of breath when talking, bibasilar crackles  L>R, no wheezing Gastrointestinal: +BS, soft, non-tender, non-distended MSK: chronic hyperpigmentation of the lower extremities bilaterally, 2+ pitting edema to the knee R>L, small circular open lesion present on the right anterior shin with serous drainage Derm: chronic skin changes as above, no other rashes or lesions on exposed skin Neuro: Awake, alert, oriented, CN 2-12 grossly intact Psych: Normal behavior, normal affect  Labs and Imaging: CBC BMET  No results for input(s): WBC, HGB, HCT, PLT in the last 168 hours. No results for input(s): NA, K, CL, CO2, BUN, CREATININE, GLUCOSE, CALCIUM in the last 168 hours.    Campbell Stall, MD 09/30/2016, 11:40 AM PGY-2, [facility] FPTS Intern pager: [phone], text pages welcome

## 2016-09-30 NOTE — Assessment & Plan Note (Signed)
Una boots removed Open ulcer has healed No evidence of active cellulitis Consider replacing Una boots during hospitalization

## 2016-09-30 NOTE — Assessment & Plan Note (Signed)
Symptoms worsening with dyspnea at rest now Continues to need additional diuresis but blood pressure remained soft and unable to support further titration of home Lasix Advised hospitalization for further management of acute CHF exacerbation We'll likely need repeat echo, chest x-ray, IV diuresis, heart failure team consult Possibly needs an inotrope to support blood pressure while diuresing

## 2016-09-30 NOTE — Progress Notes (Signed)
   Subjective:   Misty Gray is a 65 y.o. female with a history of chronic venous insufficiency, HTN, A fib, HFrEF here for CHF exacerbation and cellulitis f/u.  Patient was last seen 10/12 and low BP noted in 80s systolic Lasix was continued at higher dose of 160mg  daily (typically on 120mg  daily) due to need for further diuresis (edema and crackles present on exam) Also held enalapril and halved Toprol dose due to hypotension Unna boots last changed 10/9 Stable 2 pillow orthopnea DOE worsening - now SOB when walking <10 feet Also starting to get SOB at rest +PND - thinks this is related to O2 concentrator with CPAP not working well Denies chest pain, fevers, chills Reports legs look better than previously - nontender, less red, open ulcer closed, less swollen  Review of Systems:  Per HPI.   Social History: former smoker  Objective:  BP (!) 70/58 (BP Location: Left Wrist, Patient Position: Sitting, Cuff Size: Normal)   Pulse (!) 141   Temp 97.7 F (36.5 C) (Oral)   Wt 239 lb 12.8 oz (108.8 kg)   SpO2 99%   BMI 43.86 kg/m   Gen:  65 y.o. female sitting, cannot speak in full sentences due to SOB HEENT: NCAT, MMM, EOMI, PERRL, anicteric sclerae CV: RRR, no MRG Resp: Non-labored, bibasilar crackles L>R Abd: Soft, NTND, BS present, no guarding or organomegaly Ext: WWP, 2+ edema R>L,  0.5cm open sore with clear drainage over R shin, chronic skin changes, no active erythema, non TTP MSK: gait intact, no obvious deformities Neuro: Alert and oriented, speech normal      Chemistry      Component Value Date/Time   NA 135 09/15/2016 1124   K 3.7 09/15/2016 1124   CL 101 09/15/2016 1124   CO2 26 09/15/2016 1124   BUN 21 (H) 09/15/2016 1124   CREATININE 1.06 (H) 09/15/2016 1124   CREATININE 0.77 04/02/2016 1515      Component Value Date/Time   CALCIUM 9.2 09/15/2016 1124   ALKPHOS 71 11/07/2015 1210   AST 20 11/07/2015 1210   ALT 15 11/07/2015 1210   BILITOT 0.8 11/07/2015  1210      Lab Results  Component Value Date   WBC 6.8 09/15/2016   HGB 14.1 09/15/2016   HCT 43.6 09/15/2016   MCV 94.4 09/15/2016   PLT 125 (L) 09/15/2016   Lab Results  Component Value Date   TSH 0.331 (L) 01/09/2014   Lab Results  Component Value Date   HGBA1C 6.0 (H) 01/09/2014   Assessment & Plan:     Misty Gray is a 65 y.o. female here for   Chronic systolic dysfunction of left ventricle Symptoms worsening with dyspnea at rest now Continues to need additional diuresis but blood pressure remained soft and unable to support further titration of home Lasix Advised hospitalization for further management of acute CHF exacerbation We'll likely need repeat echo, chest x-ray, IV diuresis, heart failure team consult Possibly needs an inotrope to support blood pressure while diuresing  Venous (peripheral) insufficiency Una boots removed Open ulcer has healed No evidence of active cellulitis Consider replacing Una boots during hospitalization      Erasmo Downer, MD MPH PGY-3,  [facility] 09/30/2016  12:05 PM

## 2016-09-30 NOTE — Progress Notes (Signed)
Subjective:    Misty Gray is a 65 y.o. female who presents for follow-up of congestive heart failure. Current symptoms include: Dyspnea at rest and on minimal exertion.  She is compliant  with her cardiac medications. Recent ED visit (09/15/16) for CC: left sided chest pain and diagnosed with acute decompensated heart failure on her chronic systolic heart failure. CXR (2V) showed mild interstitial edema and left pleural effusion.  BNP 1140, Digoxin 0.4, Cr 1.1.  Patient's venous stasis ulcers and dermatitis improving with Unna's boot bilaterally   Objective:    General appearance: alert, cooperative, appears older than stated age and no distress Lungs: diminished breath sounds bilaterally Heart: irregularly irregular rhythm and systolic murmur: - 2/6, - - Extremities: edema present, woody and venous stasis dermatitis noted   Assessment:    CHF: Established problem, worsened.  "Wet and Cold" type of acute decompensated failure with volume overload and low BP.   Plan:    Admission, Heart Failure Team consultation with consideration of inotropic therapy with diuretic therapy, telemetry, r/o MI, Is patient candidate for AICD

## 2016-10-01 ENCOUNTER — Encounter (HOSPITAL_COMMUNITY): Payer: Self-pay | Admitting: General Practice

## 2016-10-01 ENCOUNTER — Inpatient Hospital Stay (HOSPITAL_COMMUNITY): Payer: Medicare Other

## 2016-10-01 DIAGNOSIS — I5023 Acute on chronic systolic (congestive) heart failure: Secondary | ICD-10-CM

## 2016-10-01 DIAGNOSIS — R0602 Shortness of breath: Secondary | ICD-10-CM

## 2016-10-01 LAB — CBC
HCT: 38.7 % (ref 36.0–46.0)
HEMOGLOBIN: 12.8 g/dL (ref 12.0–15.0)
MCH: 30 pg (ref 26.0–34.0)
MCHC: 33.1 g/dL (ref 30.0–36.0)
MCV: 90.8 fL (ref 78.0–100.0)
Platelets: 100 10*3/uL — ABNORMAL LOW (ref 150–400)
RBC: 4.26 MIL/uL (ref 3.87–5.11)
RDW: 15.3 % (ref 11.5–15.5)
WBC: 4.4 10*3/uL (ref 4.0–10.5)

## 2016-10-01 LAB — BASIC METABOLIC PANEL
ANION GAP: 7 (ref 5–15)
BUN: 15 mg/dL (ref 6–20)
CALCIUM: 8.9 mg/dL (ref 8.9–10.3)
CHLORIDE: 104 mmol/L (ref 101–111)
CO2: 27 mmol/L (ref 22–32)
Creatinine, Ser: 0.77 mg/dL (ref 0.44–1.00)
GFR calc Af Amer: >60 mL/min (ref >60)
GFR calc non Af Amer: >60 mL/min (ref >60)
GLUCOSE: 93 mg/dL (ref 65–99)
Potassium: 3.4 mmol/L — ABNORMAL LOW (ref 3.5–5.1)
Sodium: 138 mmol/L (ref 135–145)

## 2016-10-01 LAB — TROPONIN I: TROPONIN I: 0.03 ng/mL — AB (ref <0.03)

## 2016-10-01 LAB — PROTIME-INR
INR: 3.22
PROTHROMBIN TIME: 33.7 s — AB (ref 11.4–15.2)

## 2016-10-01 LAB — ECHOCARDIOGRAM COMPLETE
HEIGHTINCHES: 64 in
Weight: 3806.4 oz

## 2016-10-01 MED ORDER — WARFARIN SODIUM 2 MG PO TABS
2.0000 mg | ORAL_TABLET | Freq: Once | ORAL | Status: AC
  Administered 2016-10-01: 2 mg via ORAL
  Filled 2016-10-01: qty 1

## 2016-10-01 MED ORDER — FUROSEMIDE 10 MG/ML IJ SOLN
40.0000 mg | Freq: Two times a day (BID) | INTRAMUSCULAR | Status: DC
  Administered 2016-10-01: 40 mg via INTRAVENOUS
  Filled 2016-10-01: qty 4

## 2016-10-01 MED ORDER — POTASSIUM CHLORIDE CRYS ER 20 MEQ PO TBCR
20.0000 meq | EXTENDED_RELEASE_TABLET | Freq: Two times a day (BID) | ORAL | Status: DC
  Administered 2016-10-01 – 2016-10-03 (×4): 20 meq via ORAL
  Filled 2016-10-01: qty 2
  Filled 2016-10-01 (×2): qty 1
  Filled 2016-10-01 (×2): qty 2
  Filled 2016-10-01: qty 1

## 2016-10-01 MED ORDER — WARFARIN - PHARMACIST DOSING INPATIENT: Freq: Every day | Status: DC

## 2016-10-01 MED ORDER — FUROSEMIDE 10 MG/ML IJ SOLN
80.0000 mg | Freq: Two times a day (BID) | INTRAMUSCULAR | Status: DC
  Administered 2016-10-01 – 2016-10-03 (×5): 80 mg via INTRAVENOUS
  Filled 2016-10-01 (×6): qty 8

## 2016-10-01 MED ORDER — SPIRONOLACTONE 25 MG PO TABS
12.5000 mg | ORAL_TABLET | Freq: Every day | ORAL | Status: DC
  Administered 2016-10-01 – 2016-10-02 (×2): 12.5 mg via ORAL
  Filled 2016-10-01 (×2): qty 1

## 2016-10-01 NOTE — Care Management Note (Signed)
Case Management Note  Patient Details  Name: Misty Gray MRN: [id number] Date of Birth: 1951-04-30  Subjective/Objective:       Consult for Oxygen concentrator           Action/Plan: CM talked to patient, she stated the her oxygen concentrator at home is not working properly that she received from [facility].CM instructed her to have her spouse or other family member to take it to [facility] so that can give hear loaner until they fix her machine.  Expected Discharge Date:    possibly 10/04/2016              Expected Discharge Plan:  [facility]  Discharge planning Services  Cherrie Distance, [place] 10/01/2016, 2:01 PM

## 2016-10-01 NOTE — Progress Notes (Signed)
Heart Failure Navigator Consult Note  Presentation: Misty Gray is a 65 y.o. female presenting with shortness of breath. PMH is significant for HFrEF (EF 30-35%), A-fib (on Coumadin), HTN, chronic venous insufficiency.  Acute exacerbation of HFrEF: Pt has had SOB for the last two weeks. We have been trying to manage her as an outpatient but she has had worsening dyspnea on exertion. Exam is significant for L>R bibasilar crackles and pitting edema to the knees bilaterally. Last ECHO 12/2013 with EF 30-35%, moderately dilated LV, moderately dilated LA, mildly dilated RA, PA peak pressure 45mmHg. She had a chest x-ray performed 10/1, which showed a small left pleural effusion. She has a pacemaker in place. Cardiologist is Dr. Johney FrameAllred. She denies chest pain, but will need to rule out MI given her cardiac history.   Past Medical History:  Diagnosis Date  . Abnormal CT of the head 12/16/1984   R parietal atrophy  . Allergic rhinitis, cause unspecified   . Anemia   . Arthritis    "hands and knees" (09/06/2015)  . Atrial fibrillation (HCC)   . Cellulitis and abscess of leg 09/2016   bilateral  . CHF (congestive heart failure) (HCC)   . Diaphragmatic hernia without mention of obstruction or gangrene   . Dyspnea   . Exertional dyspnea 06/22/12  . Family history of adverse reaction to anesthesia    "daughter fights w/them; just can't relax during OR; stay awake during the OR"  . Female stress incontinence   . GERD (gastroesophageal reflux disease)   . Grand mal seizure (HCC)   . H/O hiatal hernia    two removed  . Heart murmur   . High cholesterol   . History of blood transfusion 1956   "related to nose bleed"  . Hypertension   . Influenza A 01/11/2014  . Lichenification and lichen simplex chronicus   . Migraine    "when I was a teenager"  . Morbid obesity (HCC)   . Nonischemic cardiomyopathy (HCC)    EF has normalized-repeat Pending   . On home oxygen therapy    "2L when I'm asleep in  bed" (09/06/2015)  . OSA on CPAP   . Pacemaker  st judes    Patient states "this is my third pacemaker"  . Pneumonia 2004; 2011; 11/2011  . Seizures (HCC) since 1981   "can hear you talking but sounds like you are in big tunnel; have them often if not taking RX; last one was 06/17/12" (09/06/2015)  . Sick sinus syndrome (HCC)    WITH PRIOR DDD PACEMAKER IMPLANTATION  . Stroke [facility](HCC) 1988   "mouth drawed real bad on my left side; found out it was a seizure"  . Syncope and collapse 06/22/12   "hit forehead and left knee"; denies loss of consciousness  . Unspecified venous (peripheral) insufficiency     Social History   Social History  . Marital status: Married    Spouse name: [name]  . Number of children: 4  . Years of education: 12   Occupational History  . disabled    Social History Main Topics  . Smoking status: Former Smoker    Packs/day: 1.00    Years: 7.00    Types: Cigarettes    Quit date: 03/16/1969  . Smokeless tobacco: Never Used  . Alcohol use No  . Drug use: No  . Sexual activity: Not Currently    Birth control/ protection: Post-menopausal   Other Topics Concern  . None   Social History  Narrative   Lives with husband [name] lives with them as do his 2 children   Daughter, [name], and her 3 children live in [place] -  [name] is incarcerated   Nephew, Shamiqua Vanderaa lives with them   Daughter, [name], in [name] [address]   Moved June 2013 to a better home on [facility].      Lives with husband, son, grandson, granddaughter, nephew.    ECHO: new pending  BNP    Component Value Date/Time   BNP 927.9 (H) 09/30/2016 1606   BNP 259.7 (H) 07/14/2012 1547    ProBNP    Component Value Date/Time   PROBNP 1,363.0 (H) 01/12/2014 0420     Education Assessment and Provision:  Detailed education and instructions provided on heart failure disease management including the following:  Signs and  symptoms of Heart Failure When to call the physician Importance of daily weights Low sodium diet Fluid restriction Medication management Anticipated future follow-up appointments  Patient education given on each of the above topics.  Patient acknowledges understanding and acceptance of all instructions.  I spoke to Ms. Woolstenhulme regarding her HF diagnosis and current hospitalization.  She was very SOB with moving from potty chair to bed.  She tells me that she weighs daily and has a scale that was provided by [facility].  She was not aware of when to call the physician related to weight gains.  We discussed the importance of daily weights and I reinforced when to contact the physician.  She denies added Salt use and I reviewed a low sodium foods as well as high sodium foods to avoid.  She also admits that she sometimes is forced to skip medications due to inability to afford them.  I reinforced the need to notify physician if she is unable to afford medications as well as the importance of taking all meds as prescribed.  She lives with her husband and other family members in [place].    Education Materials:  "Living Better With Heart Failure" Booklet, Daily Weight Tracker Tool   High Risk Criteria for Readmission and/or Poor Patient Outcomes:  (Recommend Follow-[facility] with [facility])--yes she would benefit from HF team referral.   EF <30%- new echo pending  2 or more admissions in 6 months- No  Difficult social situation- denies except finances  Demonstrates medication noncompliance-  She admits to skipping medications due to "running out".  She admits that her medication copays are expensive.    Barriers of Care:  Health Literacy, HF knowledge and compliance  Discharge Planning:   Plans to return to home with husband , son, nephew, and grandchildren in [place].  She will benefit from [facility] referral for ongoing education, compliance reinforcement and HF symptom recognition.

## 2016-10-01 NOTE — Progress Notes (Signed)
Family Medicine Teaching Service Daily Progress Note Intern Pager: [phone]  [name] Medical record number: [id number] Date of birth: [birth year] Age: 65 y.o. Gender: female  Primary Care Provider: Uvaldo Rising, MD Consultants: Heart failure team Code Status: Full   Pt Overview and Major Events to Date:  10/17: Echo  Assessment and Plan: Misty Gray is a 65 y.o. female presenting with shortness of breath. PMH is significant for HFrEF (EF 30-35%), A-fib (on Coumadin), HTN, chronic venous insufficiency.  # Acute exacerbation of HFrEF:  Patient diuresed well overnight 1100 ml. Shortness of breath has improved. Patient will seen by the heart failure team today. Dig level was 0.4 and subtherapeutic. Troponin have been negative x3. Echo was done today, and showed EF of 30-35% with worsening of pulmonary hypertension (increased RV systolic pressure).  --F/u on HF team recs, appreciate input --Continue diuresis with Lasix 40 mg IV BID --Holding Enalapril and Spironolactone for now.  --Continue home Metoprolol succinate 12.5mg  daily and Digoxin 0.125mg  daily --Daily weights --Strict I/O --O2 therapy prn to keep O2 sats > 92%  #Hypotension with history of hypertension  Likely related to reduced EF. This morning BP is 92/56 --Continue to monitor BPs closely --Holding Enalapril and Spironolactone pending HF team recommendations. --Continue Metoprolol succinate 12.5mg  daily and Digoxin 0.125mg  daily.  #Atrial Fibrillation, rate-controlled Well controlled, currently rate is 66 --Continue Metoprolol succinate and Digoxin --Coumadin per pharm  #Chronic Venous Insufficiency: Patient was wering bilateral unna boots, which were removed in clinic yesterday with  Has been treated many times for cellulitis, but I do not think she has active cellulitis at this time. She feels that her venous stasis ulcers are improving with the unna boots. --She will need unna boots  replaced prior to discharge  #OSA:  Echo on 10/17 showed worsening pulmonary hypertension. --Continue CPAP at night  --Oxygen and concentrator have been ordered --She will likely need a new CPAP machine ordered when she is discharged.  FEN/GI: Heart healthy diet Prophylaxis: On Coumadin for A-fib   Disposition: Pending response to IV Lasix. Anticipate discharge home in 3-4 days.  Subjective:  No acute events overnight. Patient feeling better this morning, breathing has improved. She has been watching fluid intake. Patient denies any shortness of breath.  Objective: Temp:  [97.5 F (36.4 C)-98.2 F (36.8 C)] 98.2 F (36.8 C) (10/17 0750) Pulse Rate:  [72-141] 72 (10/17 0750) Resp:  [18-20] 18 (10/17 0750) BP: (70-100)/(49-68) 100/64 (10/17 0750) SpO2:  [95 %-100 %] 95 % (10/17 0750) Weight:  [237 lb 14.4 oz (107.9 kg)-239 lb 12.8 oz (108.8 kg)] 237 lb 14.4 oz (107.9 kg) (10/17 0422) Physical Exam:  General: well appeared this morning, in NAD, answering questions appropriately Eyes: EOMI, PERRLA, no scleral icterus ENTM: Oropharynx clear, mildly dry mucous membranes Neck: Supple, no masses, no JVD Cardiovascular: Irregularly irregular rhythm, no murmurs Respiratory: Clear to auscultation bilaterally, no crackles discerned on exam, no wheezing Gastrointestinal: +BS, soft, non-tender, non-distended MSK: chronic hyperpigmentation of the lower extremities bilaterally, 2+ pitting edema to the knee R>L, small circular open lesion present on the right anterior shin with serous drainage Derm: chronic skin changes as above, no other rashes or lesions on exposed skin Neuro: Awake, alert, oriented, CN 2-12 grossly intact Psych: Normal behavior, normal affect   Laboratory:  Recent Labs Lab 09/30/16 1606 10/01/16 0336  WBC 4.1 4.4  HGB 13.1 12.8  HCT 39.4 38.7  PLT 105* 100*    Recent Labs Lab 09/30/16  1606 10/01/16 0336  NA 138 138  K 3.1* 3.4*  CL 101 104  CO2 30 27   BUN 14 15  CREATININE 0.77 0.77  CALCIUM 9.2 8.9  GLUCOSE 141* 93     Imaging/Diagnostic Tests: X-ray Chest Pa And Lateral  Result Date: 10/01/2016 CLINICAL DATA:  Shortness of breath. EXAM: CHEST  2 VIEW COMPARISON:  09/15/2016. FINDINGS: Cardiac pacer in stable position. Cardiomegaly with pulmonary vascular prominence and bilateral interstitial prominence noted consistent with congestive heart failure. Low lung volumes with bibasilar atelectasis . Small left pleural effusion. No pneumothorax. IMPRESSION: 1. Cardiac pacer stable position. Cardiomegaly with bilateral pulmonary interstitial prominence and left pleural effusion consistent with congestive heart failure. 2. Low lung volumes with bibasilar atelectasis. Electronically Signed   By: Maisie Fushomas  Register   On: 10/01/2016 06:58    Lovena NeighboursAbdoulaye Damonique Brunelle, MD 10/01/2016, 9:26 AM PGY-1, [facility] [facility] FPTS Intern pager: [phone], text pages welcome

## 2016-10-01 NOTE — Progress Notes (Signed)
  Echocardiogram 2D Echocardiogram has been performed.  Arvil Chaco 10/01/2016, 10:46 AM

## 2016-10-01 NOTE — Progress Notes (Signed)
  Patient seen and examined. Full consult note to follow.   She has significant volume overload in the setting of probable ischemic CM and dietary noncompliance. Agree with IV lasix and wrapping LEs.   ECHO with EF 30-35%.   I do not see an evidence of a previous ischemic evaluation. Will need to consider prior to d/c.  [name], Daniel,MD 4:12 PM

## 2016-10-01 NOTE — Progress Notes (Signed)
ANTICOAGULATION CONSULT NOTE  Pharmacy Consult for Coumadin Indication: atrial fibrillation  Allergies  Allergen Reactions  . Aspirin Hives and Rash  . Penicillins Rash and Other (See Comments)    Amoxicillin also, Has patient had a PCN reaction causing immediate rash, facial/tongue/throat swelling, SOB or lightheadedness with hypotension: Yes Has patient had a PCN reaction causing severe rash involving mucus membranes or skin necrosis: No Has patient had a PCN reaction that required hospitalization No Has patient had a PCN reaction occurring within the last 10 years: Yes If all of the above answers are "NO", then may proceed with Cephalosporin use.   . Tape Other (See Comments)    NO PAPER TAPE-MUST BE MEDICAL TAPE  . Wool Alcohol [Lanolin] Hives  . Amoxicillin Rash  . Ampicillin Itching and Rash    Patient Measurements: Height: 5\' 4"  (162.6 cm) Weight: 237 lb 14.4 oz (107.9 kg) (scale a) IBW/kg (Calculated) : 54.7  Vital Signs: Temp: 97.9 F (36.6 C) (10/17 0934) Temp Source: Oral (10/17 0934) BP: 102/77 (10/17 0934) Pulse Rate: 85 (10/17 0934)  Labs:  Recent Labs  09/30/16 1606 09/30/16 2106 10/01/16 0336  HGB 13.1  --  12.8  HCT 39.4  --  38.7  PLT 105*  --  100*  LABPROT 33.3*  --  33.7*  INR 3.18  --  3.22  CREATININE 0.77  --  0.77  TROPONINI 0.03* 0.03* 0.03*    Estimated Creatinine Clearance: 81.9 mL/min (by C-G formula based on SCr of 0.77 mg/dL).   Medical History: Past Medical History:  Diagnosis Date  . Abnormal CT of the head 12/16/1984   R parietal atrophy  . Allergic rhinitis, cause unspecified   . Anemia   . Arthritis    "hands and knees" (09/06/2015)  . Atrial fibrillation (HCC)   . Cellulitis and abscess of leg 09/2016   bilateral  . CHF (congestive heart failure) (HCC)   . Diaphragmatic hernia without mention of obstruction or gangrene   . Dyspnea   . Exertional dyspnea 06/22/12  . Family history of adverse reaction to anesthesia    "daughter fights w/them; just can't relax during OR; stay awake during the OR"  . Female stress incontinence   . GERD (gastroesophageal reflux disease)   . Grand mal seizure (HCC)   . H/O hiatal hernia    two removed  . Heart murmur   . High cholesterol   . History of blood transfusion 1956   "related to nose bleed"  . Hypertension   . Influenza A 01/11/2014  . Lichenification and lichen simplex chronicus   . Migraine    "when I was a teenager"  . Morbid obesity (HCC)   . Nonischemic cardiomyopathy (HCC)    EF has normalized-repeat Pending   . On home oxygen therapy    "2L when I'm asleep in bed" (09/06/2015)  . OSA on CPAP   . Pacemaker  st judes    Patient states "this is my third pacemaker"  . Pneumonia 2004; 2011; 11/2011  . Seizures (HCC) since 1981   "can hear you talking but sounds like you are in big tunnel; have them often if not taking RX; last one was 06/17/12" (09/06/2015)  . Sick sinus syndrome (HCC)    WITH PRIOR DDD PACEMAKER IMPLANTATION  . Stroke [facility] 1988   "mouth drawed real bad on my left side; found out it was a seizure"  . Syncope and collapse 06/22/12   "hit forehead and left knee"; denies loss  of consciousness  . Unspecified venous (peripheral) insufficiency    Assessment: 65yof on coumadin pta for afib, admitted with acute heart failure. INR on admission slightly above goal at 3.18, now 3.22 this AM after dose was held last night. Will give small dose tonight to keep INR from dropping too low after held dose. CBC wnl. No bleed documented. Noted on digoxin and phenobarbital.  Home dose: 10mg  daily - last taken 10/15  Goal of Therapy:  INR 2-3 Monitor platelets by anticoagulation protocol: Yes   Plan:  Warfarin 2mg  x 1 dose tonight Daily INR Monitor CBC, s/sx bleeding  Babs BertinHaley Travian Kerner, PharmD, BCPS Clinical Pharmacist 10/01/2016 10:47 AM

## 2016-10-01 NOTE — Progress Notes (Signed)
Placed pt on nasal cpap tolerating well no issues to report

## 2016-10-01 NOTE — Consult Note (Signed)
   [facility] Inpatient Consult   10/01/2016  ZAHRIA SIEG 11-04-1951 [id number]  Referral received.  Came by to see patient but patient was sound asleep, did not disturb at this time.  Chart reviewed and patient is eligible for [facility] Care Management will follow up for HF and COPD exacerbations and care management needs for community follow up.  Patient has [facility].  Will follow for post hospital follow up needs.  Charlesetta Shanks, RN BSN CCM [facility]  [phone] business mobile phone Toll free office [phone]

## 2016-10-01 NOTE — Discharge Instructions (Addendum)
You were admitted to the hospital for heart failure exacerbation.  You have been started on some new medication, including amiodarone, and torsemide.  Please review the updated medication list.  You have also been restarted on Coumadin. You're currently on 10 mg daily and have an appointment to have your INR checked next week on 10/14/2016 at 11 AM. It is very important that you go to this appointment.  You also have a follow-up appointment scheduled with Dr. Gala Gray on 10/18/2016 at 10 AM.     Information on my medicine - Coumadin   (Warfarin)  This medication education was reviewed with me or my healthcare representative as part of my discharge preparation.  Why was Coumadin prescribed for you? Coumadin was prescribed for you because you have a blood clot or a medical condition that can cause an increased risk of forming blood clots. Blood clots can cause serious health problems by blocking the flow of blood to the heart, lung, or brain. Coumadin can prevent harmful blood clots from forming. As a reminder your indication for Coumadin is:   Stroke Prevention Because Of Atrial Fibrillation  What test will check on my response to Coumadin? While on Coumadin (warfarin) you will need to have an INR test regularly to ensure that your dose is keeping you in the desired range. The INR (international normalized ratio) number is calculated from the result of the laboratory test called prothrombin time (PT).  If an INR APPOINTMENT HAS NOT ALREADY BEEN MADE FOR YOU please schedule an appointment to have this lab work done by your health care provider within 7 days. Your INR goal is usually a number between:  2 to 3 or your provider may give you a more narrow range like 2-2.5.  Ask your health care provider during an office visit what your goal INR is.  What  do you need to  know  About  COUMADIN? Take Coumadin (warfarin) exactly as prescribed by your healthcare provider about the same time each day.  DO  NOT stop taking without talking to the doctor who prescribed the medication.  Stopping without other blood clot prevention medication to take the place of Coumadin may increase your risk of developing a new clot or stroke.  Get refills before you run out.  What do you do if you miss a dose? If you miss a dose, take it as soon as you remember on the same day then continue your regularly scheduled regimen the next day.  Do not take two doses of Coumadin at the same time.  Important Safety Information A possible side effect of Coumadin (Warfarin) is an increased risk of bleeding. You should call your healthcare provider right away if you experience any of the following: ? Bleeding from an injury or your nose that does not stop. ? Unusual colored urine (red or dark brown) or unusual colored stools (red or black). ? Unusual bruising for unknown reasons. ? A serious fall or if you hit your head (even if there is no bleeding).  Some foods or medicines interact with Coumadin (warfarin) and might alter your response to warfarin. To help avoid this: ? Eat a balanced diet, maintaining a consistent amount of Vitamin K. ? Notify your provider about major diet changes you plan to make. ? Avoid alcohol or limit your intake to 1 drink for women and 2 drinks for men per day. (1 drink is 5 oz. wine, 12 oz. beer, or 1.5 oz. liquor.)  Make sure that ANY  health care provider who prescribes medication for you knows that you are taking Coumadin (warfarin).  Also make sure the healthcare provider who is monitoring your Coumadin knows when you have started a new medication including herbals and non-prescription products.  Coumadin (Warfarin)  Major Drug Interactions  Increased Warfarin Effect Decreased Warfarin Effect  Alcohol (large quantities) Antibiotics (esp. Septra/Bactrim, Flagyl, Cipro) Amiodarone (Cordarone) Aspirin (ASA) Cimetidine (Tagamet) Megestrol (Megace) NSAIDs (ibuprofen, naproxen,  etc.) Piroxicam (Feldene) Propafenone (Rythmol SR) Propranolol (Inderal) Isoniazid (INH) Posaconazole (Noxafil) Barbiturates (Phenobarbital) Carbamazepine (Tegretol) Chlordiazepoxide (Librium) Cholestyramine (Questran) Griseofulvin Oral Contraceptives Rifampin Sucralfate (Carafate) Vitamin K   Coumadin (Warfarin) Major Herbal Interactions  Increased Warfarin Effect Decreased Warfarin Effect  Garlic Ginseng Ginkgo biloba Coenzyme Q10 Green tea St. Johns wort    Coumadin (Warfarin) FOOD Interactions  Eat a consistent number of servings per week of foods HIGH in Vitamin K (1 serving =  cup)  Collards (cooked, or boiled & drained) Kale (cooked, or boiled & drained) Mustard greens (cooked, or boiled & drained) Parsley *serving size only =  cup Spinach (cooked, or boiled & drained) Swiss chard (cooked, or boiled & drained) Turnip greens (cooked, or boiled & drained)  Eat a consistent number of servings per week of foods MEDIUM-HIGH in Vitamin K (1 serving = 1 cup)  Asparagus (cooked, or boiled & drained) Broccoli (cooked, boiled & drained, or raw & chopped) Brussel sprouts (cooked, or boiled & drained) *serving size only =  cup Lettuce, raw (green leaf, endive, romaine) Spinach, raw Turnip greens, raw & chopped   These websites have more information on Coumadin (warfarin):  http://www.king-russell.com/; https://www.hines.net/;

## 2016-10-01 NOTE — Consult Note (Signed)
Advanced Heart Failure Team Consult Note   ADVANCED HF TEAM CONSULT NOTE  Referring Physician: Randolm Idol Primary Physician: Primary Cardiologist:  Allred  Reason for Consultation: A/C Heart Failure  HPI:    Misty Gray is a 65 y.o. female with h/o morbid obesity, chronic AF with tachy/brady s/p pacer, HTN, HL, OSA and chronic systolic HF (presumed NICM) who was admitted for recurrent HF symptoms and bilateral LE cellulitis. We are asked to consult for help with management of her HF.   She has been seen by Dr. Johney Frame in the past for persistent AF c/b tachy-brady syndrome and is s/p pacer. She has also had chronic systolic HF with EF in the 35% range for some time but has refused upgrade to CRT or ischemic w/u.   She has been followed in the FPTS clinic and struggles with severe LE edema and noncompliance. She has had progressive HF symptoms over past 2 weeks and has been seen multiple times in their clinic and the ED. According to the notes, she was seen for follow-up in the [facility] clinic on the day of admit and found to have marked edema with BP 70/58. + SOB with minimal exertion. + orthopnea. Says CPAP wasn't working. Also concern for LE cellulitis  Was admitted and placed on IV lasix 40 bid. Urine output so far has been sluggish and BP soft. No fevers or chills. Very sedentary at home says can get around the house but rarely goes out and sits in her recliner all day. Denies CP. Does not way herself regularly. Drinks lots of fluids.    Review of Systems: [y] = yes, [ ]  = no   General: Weight gain Cove.Etienne ]; Weight loss [ ] ; Anorexia [ ] ; Fatigue [ y]; Fever [ ] ; Chills [ ] ; Weakness Cove.Etienne ]  Cardiac: Chest pain/pressure [ ] ; Resting SOB [ y]; Exertional SOB Cove.Etienne ]; Orthopnea [ ] ; Pedal Edema [ ] ; Palpitations [ ] ; Syncope [ ] ; Presyncope [ ] ; Paroxysmal nocturnal dyspnea[ y]  Pulmonary: Cough [ ] ; Wheezing[ ] ; Hemoptysis[ ] ; Sputum [ ] ; Snoring Cove.Etienne ]  GI: Vomiting[ ] ; Dysphagia[ ] ; Melena[ ] ; Hematochezia [  ]; Heartburn[ y]; Abdominal pain [ ] ; Constipation Cove.Etienne ]; Diarrhea [ ] ; BRBPR [ ]   GU: Hematuria[ ] ; Dysuria [ ] ; Nocturia[ ]   Vascular: Pain in legs with walking [ ] ; Pain in feet with lying flat [ ] ; Non-healing sores [ ] ; Stroke [ ] ; TIA [ ] ; Slurred speech [ ] ;  Neuro: Headaches[ ] ; Vertigo[ ] ; Seizures[ ] ; Paresthesias[ ] ;Blurred vision [ ] ; Diplopia [ ] ; Vision changes [ ]   Ortho/Skin: Arthritis Cove.Etienne ]; Joint pain Cove.Etienne ]; Muscle pain [ ] ; Joint swelling [ ] ; Back Pain [ ] ; Rash [ ]   Psych: Depression[ ] ; Anxiety[ ]   Heme: Bleeding problems [ ] ; Clotting disorders [ ] ; Anemia [ ]   Endocrine: Diabetes [ ] ; Thyroid dysfunction[ ]   Home Medications Prior to Admission medications   Medication Sig Start Date End Date Taking? Authorizing Provider  [employer] VITAMIN D3 1000 units capsule TAKE ONE CAPSULE BY MOUTH EVERY MORNING Patient taking differently: TAKE ONE CAPSULE (1000 units) BY MOUTH EVERY MORNING 08/12/16  Yes Uvaldo Rising, MD  digoxin (LANOXIN) 0.125 MG tablet TAKE 1 TABLET (125 MCG TOTAL) BY MOUTH DAILY. 04/19/16  Yes Nestor Ramp, MD  fluticasone (FLONASE) 50 MCG/ACT nasal spray Place 2 sprays into both nostrils daily as needed. For congestion. 04/21/15  Yes Renee A Kuneff, DO  furosemide (LASIX) 80 MG tablet  TAKE ONE TABLET BY MOUTH IN THE MORNING AND 1/2 TABLET IN THE EVENING Patient taking differently: TAKE ONE TABLET (80 mg) BY MOUTH IN THE MORNING AND 1/2 TABLET (40 mg) IN THE EVENING 07/16/16  Yes Uvaldo Rising, MD  metoprolol succinate (TOPROL-XL) 25 MG 24 hr tablet TAKE 1 TABLET BY MOUTH EVERY DAY Patient taking differently: TAKE 12.5MG  BY MOUTH ONCE DAILY 08/15/16  Yes Uvaldo Rising, MD  nystatin (MYCOSTATIN/NYSTOP) powder Apply topically 4 (four) times daily. 08/09/16  Yes Uvaldo Rising, MD  omeprazole (PRILOSEC) 20 MG capsule TAKE ONE CAPSULE BY MOUTH EVERY DAY Patient taking differently: TAKE ONE CAPSULE (20 mg) BY MOUTH EVERY DAY 05/14/16  Yes Uvaldo Rising, MD  OXYGEN Inhale 2 L into the  lungs at bedtime. cpap machine   Yes Historical Provider, MD  PHENobarbital (LUMINAL) 32.4 MG tablet TAKE 2 TABLETS BY MOUTH TWICE A DAY Patient taking differently: TAKE 2 TABLETS (64.8 mg) BY MOUTH TWICE A DAY 06/17/16  Yes Uvaldo Rising, MD  spironolactone (ALDACTONE) 25 MG tablet TAKE 1 TABLET (25 MG TOTAL) BY MOUTH DAILY. 06/17/16  Yes Uvaldo Rising, MD  warfarin (COUMADIN) 5 MG tablet TAKE 2 AND 1/2 TABS MONDAY, WEDNESDAY, FRIDAY,& SATURDAY EVENINGS AND 2 TABS ALL OTHER EVENINGS Patient taking differently: Take 10 mg every day at bedtime 07/02/16  Yes Uvaldo Rising, MD    Past Medical History: Past Medical History:  Diagnosis Date  . Abnormal CT of the head 12/16/1984   R parietal atrophy  . Allergic rhinitis, cause unspecified   . Anemia   . Arthritis    "hands and knees" (09/06/2015)  . Atrial fibrillation (HCC)   . Cellulitis and abscess of leg 09/2016   bilateral  . CHF (congestive heart failure) (HCC)   . Diaphragmatic hernia without mention of obstruction or gangrene   . Dyspnea   . Exertional dyspnea 06/22/12  . Family history of adverse reaction to anesthesia    "daughter fights w/them; just can't relax during OR; stay awake during the OR"  . Female stress incontinence   . GERD (gastroesophageal reflux disease)   . Grand mal seizure (HCC)   . H/O hiatal hernia    two removed  . Heart murmur   . High cholesterol   . History of blood transfusion 1956   "related to nose bleed"  . Hypertension   . Influenza A 01/11/2014  . Lichenification and lichen simplex chronicus   . Migraine    "when I was a teenager"  . Morbid obesity (HCC)   . Nonischemic cardiomyopathy (HCC)    EF has normalized-repeat Pending   . On home oxygen therapy    "2L when I'm asleep in bed" (09/06/2015)  . OSA on CPAP   . Pacemaker  st judes    Patient states "this is my third pacemaker"  . Pneumonia 2004; 2011; 11/2011  . Seizures (HCC) since 1981   "can hear you talking but sounds like you are in big  tunnel; have them often if not taking RX; last one was 06/17/12" (09/06/2015)  . Sick sinus syndrome (HCC)    WITH PRIOR DDD PACEMAKER IMPLANTATION  . Stroke [facility]) 1988   "mouth drawed real bad on my left side; found out it was a seizure"  . Syncope and collapse 06/22/12   "hit forehead and left knee"; denies loss of consciousness  . Unspecified venous (peripheral) insufficiency     Past Surgical History: Past Surgical History:  Procedure  Laterality Date  . APPENDECTOMY    . HERNIA REPAIR  ? 1988; 04/15/1998  . INSERT / REPLACE / REMOVE PACEMAKER  12/16/1990   DDD EF 30%;  Marland Kitchen. INSERT / REPLACE / REMOVE PACEMAKER  07/25/2003   replaced by BB, leads are both IS1 and from 1992  . INSERT / REPLACE / REMOVE PACEMAKER  05/21/2011   JA; generator change  . LEG SKIN LESION  BIOPSY / EXCISION  05/16/2001   Lichen Planus  . VENTRAL HERNIA REPAIR  1989; 04/15/1998    Family History: Family History  Problem Relation Age of Onset  . Stroke Father     died from CAD?  Marland Kitchen. Heart disease Father   . Cancer Mother   . Diabetes Son     Type 1  . Sleep apnea Son   . Cancer Sister     cervical  . Hypertension Sister   . Hypertension Brother   . Rheum arthritis Daughter     Also PGM, PGGM    Social History: Social History   Social History  . Marital status: Married    Spouse name: [name]  . Number of children: 4  . Years of education: 12   Occupational History  . disabled    Social History Main Topics  . Smoking status: Former Smoker    Packs/day: 1.00    Years: 7.00    Types: Cigarettes    Quit date: 03/16/1969  . Smokeless tobacco: Never Used  . Alcohol use No  . Drug use: No  . Sexual activity: Not Currently    Birth control/ protection: Post-menopausal   Other Topics Concern  . None   Social History Narrative   Lives with husband Doristine LocksSteven   Son - Dorothey BasemanJames Allen lives with them as do his 2 children   Daughter, Steward DroneBrenda, and her 3 children live in [place]   Son -  [name] is  incarcerated   Nephew, Clelia CroftDavid [place] lives with them   [place]    [name] [employer]   Moved June 2013 [address]      Lives with husband, son, grandson, granddaughter, nephew.    Allergies:  Allergies  Allergen Reactions  . Aspirin Hives and Rash  . Penicillins Rash and Other (See Comments)    Amoxicillin also, Has patient had a PCN reaction causing immediate rash, facial/tongue/throat swelling, SOB or lightheadedness with hypotension: Yes Has patient had a PCN reaction causing severe rash involving mucus membranes or skin necrosis: No Has patient had a PCN reaction that required hospitalization No Has patient had a PCN reaction occurring within the last 10 years: Yes If all of the above answers are "NO", then may proceed with Cephalosporin use.   . Tape Other (See Comments)    NO PAPER TAPE-MUST BE MEDICAL TAPE  . Wool Alcohol [Lanolin] Hives  . Amoxicillin Rash  . Ampicillin Itching and Rash    Objective:    Vital Signs:   Temp:  [97.6 F (36.4 C)-98.2 F (36.8 C)] 97.8 F (36.6 C) (10/17 2015) Pulse Rate:  [65-85] 65 (10/17 2015) Resp:  [18-20] 19 (10/17 2015) BP: (92-102)/(56-77) 98/57 (10/17 2015) SpO2:  [95 %-100 %] 96 % (10/17 2015) Weight:  [107.9 kg (237 lb 14.4 oz)] 107.9 kg (237 lb 14.4 oz) (10/17 0422) Last BM Date: 10/01/16  Weight change: Filed Weights   09/30/16 1619 10/01/16 0422  Weight: 108.2 kg (238 lb 8 oz) 107.9 kg (237  lb 14.4 oz)    Intake/Output:   Intake/Output Summary (Last 24 hours) at 10/01/16 2147 Last data filed at 10/01/16 1950  Gross per 24 hour  Intake             1680 ml  Output              900 ml  Net              780 ml     Physical Exam: General:  Chronically ill-appearing NAD HEENT: normal Neck: supple. JVP jaw . Carotids 2+ bilat; no bruits. No lymphadenopathy or thryomegaly appreciated. Cor: PMI nonpalpable Irregular rate & rhythm. 2/6 TR Lungs: crackles  at bases Abdomen: obese + abdominal hernial soft, nontender, mildly distended. No bruits or masses. Good bowel sounds. Extremities: no cyanosis, clubbing, rash, 2-3+ edema into thighs. Markedly erythematous by calves Neuro: alert & orientedx3, cranial nerves grossly intact. moves all 4 extremities w/o difficulty. Affect pleasant  Telemetry: AF 76 with narrow complex QRS. Intermittent v-pacing  Labs: Basic Metabolic Panel:  Recent Labs Lab 09/30/16 1606 10/01/16 0336  NA 138 138  K 3.1* 3.4*  CL 101 104  CO2 30 27  GLUCOSE 141* 93  BUN 14 15  CREATININE 0.77 0.77  CALCIUM 9.2 8.9    Liver Function Tests: No results for input(s): AST, ALT, ALKPHOS, BILITOT, PROT, ALBUMIN in the last 168 hours. No results for input(s): LIPASE, AMYLASE in the last 168 hours. No results for input(s): AMMONIA in the last 168 hours.  CBC:  Recent Labs Lab 09/30/16 1606 10/01/16 0336  WBC 4.1 4.4  HGB 13.1 12.8  HCT 39.4 38.7  MCV 91.8 90.8  PLT 105* 100*    Cardiac Enzymes:  Recent Labs Lab 09/30/16 1606 09/30/16 2106 10/01/16 0336  TROPONINI 0.03* 0.03* 0.03*    BNP: BNP (last 3 results)  Recent Labs  09/15/16 1124 09/30/16 1606  BNP 1,140.6* 927.9*    ProBNP (last 3 results) No results for input(s): PROBNP in the last 8760 hours.   CBG: No results for input(s): GLUCAP in the last 168 hours.  Coagulation Studies:  Recent Labs  09/30/16 1606 10/01/16 0336  LABPROT 33.3* 33.7*  INR 3.18 3.22    Other results: .  Imaging: X-ray Chest Pa And Lateral  Result Date: 10/01/2016 CLINICAL DATA:  Shortness of breath. EXAM: CHEST  2 VIEW COMPARISON:  09/15/2016. FINDINGS: Cardiac pacer in stable position. Cardiomegaly with pulmonary vascular prominence and bilateral interstitial prominence noted consistent with congestive heart failure. Low lung volumes with bibasilar atelectasis . Small left pleural effusion. No pneumothorax. IMPRESSION: 1. Cardiac pacer stable  position. Cardiomegaly with bilateral pulmonary interstitial prominence and left pleural effusion consistent with congestive heart failure. 2. Low lung volumes with bibasilar atelectasis. Electronically Signed   By: Maisie Fus  Register   On: 10/01/2016 06:58      Medications:     Current Medications: . digoxin  0.125 mg Oral Daily  . furosemide  80 mg Intravenous Q12H  . pantoprazole  40 mg Oral Daily  . PHENobarbital  64.8 mg Oral BID  . potassium chloride  20 mEq Oral BID  . spironolactone  12.5 mg Oral Daily  . Warfarin - Pharmacist Dosing Inpatient   Does not apply q1800     Infusions:      Assessment:   1. Acute on chronic systolic HF   --ECHO 10/01/16 EF 30-35% with inferior AK 2. LE cellulits 3. Morbid obesity 4. HTN 5. HL 6.  Chronic AF with tach brady s/p pacemaker   Plan/Discussion:    She has marked volume overload in what I suspect is likely an ischemic cardiomyopathy. Her BP is soft and she is diuresing poorly so their is concern for low output. Will stop b-blocker. Increase lasix to 80 IV bid. She may need inotropic support.   I have reviewed echo personally and EF is in the 30-35% range with inferior WMA. She has previously refused upgrade to CRT on ischemic work-up. I talked to her again about possible cardiac cath and she is willing to consider. Will see how she responds to diuresis overnight and discuss cath with her further in the am. Will hold warfarin for possible cath.   Agree with wrapping her legs and treating for cellulitis.   She has very poor insight into her disease process. Long talk about need for dietary discretion. Will need Paramedicine f/u on d/c.    Length of Stay: 1  [name], Daniel MD 10/01/2016, 9:47 PM  Advanced Heart Failure Team Pager [phone] (M-F; 7a - 4p)  Please contact [facility] for night-coverage after hours (4p -7a ) and weekends on [email or web address]

## 2016-10-02 ENCOUNTER — Other Ambulatory Visit: Payer: Self-pay

## 2016-10-02 DIAGNOSIS — I481 Persistent atrial fibrillation: Secondary | ICD-10-CM

## 2016-10-02 DIAGNOSIS — I5023 Acute on chronic systolic (congestive) heart failure: Principal | ICD-10-CM

## 2016-10-02 LAB — CBC
HCT: 37.4 % (ref 36.0–46.0)
Hemoglobin: 12.4 g/dL (ref 12.0–15.0)
MCH: 30.2 pg (ref 26.0–34.0)
MCHC: 33.2 g/dL (ref 30.0–36.0)
MCV: 91 fL (ref 78.0–100.0)
PLATELETS: 89 10*3/uL — AB (ref 150–400)
RBC: 4.11 MIL/uL (ref 3.87–5.11)
RDW: 15.3 % (ref 11.5–15.5)
WBC: 4 10*3/uL (ref 4.0–10.5)

## 2016-10-02 LAB — BASIC METABOLIC PANEL
Anion gap: 8 (ref 5–15)
BUN: 14 mg/dL (ref 6–20)
CO2: 28 mmol/L (ref 22–32)
CREATININE: 0.7 mg/dL (ref 0.44–1.00)
Calcium: 8.9 mg/dL (ref 8.9–10.3)
Chloride: 99 mmol/L — ABNORMAL LOW (ref 101–111)
Glucose, Bld: 91 mg/dL (ref 65–99)
POTASSIUM: 3.3 mmol/L — AB (ref 3.5–5.1)
SODIUM: 135 mmol/L (ref 135–145)

## 2016-10-02 LAB — GLUCOSE, CAPILLARY
Glucose-Capillary: 83 mg/dL (ref 65–99)
Glucose-Capillary: 121 mg/dL — ABNORMAL HIGH (ref 65–99)

## 2016-10-02 LAB — PROTIME-INR
INR: 2.31
Prothrombin Time: 25.8 seconds — ABNORMAL HIGH (ref 11.4–15.2)

## 2016-10-02 MED ORDER — LOSARTAN POTASSIUM 25 MG PO TABS
12.5000 mg | ORAL_TABLET | Freq: Every day | ORAL | Status: DC
  Administered 2016-10-03 – 2016-10-06 (×4): 12.5 mg via ORAL
  Filled 2016-10-02 (×4): qty 1

## 2016-10-02 MED ORDER — SPIRONOLACTONE 25 MG PO TABS
25.0000 mg | ORAL_TABLET | Freq: Every day | ORAL | Status: DC
  Administered 2016-10-03 – 2016-10-07 (×5): 25 mg via ORAL
  Filled 2016-10-02 (×5): qty 1

## 2016-10-02 MED ORDER — METOLAZONE 5 MG PO TABS
5.0000 mg | ORAL_TABLET | Freq: Every day | ORAL | Status: DC
  Administered 2016-10-02: 5 mg via ORAL
  Filled 2016-10-02 (×2): qty 1

## 2016-10-02 NOTE — Progress Notes (Signed)
ANTICOAGULATION CONSULT NOTE - Follow Up Consult  Pharmacy Consult for Heparin when INR < 2 Indication: atrial fibrillation  Allergies  Allergen Reactions  . Aspirin Hives and Rash  . Penicillins Rash and Other (See Comments)    Amoxicillin also, Has patient had a PCN reaction causing immediate rash, facial/tongue/throat swelling, SOB or lightheadedness with hypotension: Yes Has patient had a PCN reaction causing severe rash involving mucus membranes or skin necrosis: No Has patient had a PCN reaction that required hospitalization No Has patient had a PCN reaction occurring within the last 10 years: Yes If all of the above answers are "NO", then may proceed with Cephalosporin use.   . Tape Other (See Comments)    NO PAPER TAPE-MUST BE MEDICAL TAPE  . Wool Alcohol [Lanolin] Hives  . Amoxicillin Rash  . Ampicillin Itching and Rash    Patient Measurements: Height: 5\' 4"  (162.6 cm) Weight: 237 lb 14.4 oz (107.9 kg) (scale a) IBW/kg (Calculated) : 54.7 Heparin Dosing Weight: 80kg  Vital Signs: Temp: 97.8 F (36.6 C) (10/18 0307) Temp Source: Oral (10/18 0307) BP: 96/58 (10/18 1122) Pulse Rate: 69 (10/18 1122)  Labs:  Recent Labs  09/30/16 1606 09/30/16 2106 10/01/16 0336 10/02/16 0335 10/02/16 0603  HGB 13.1  --  12.8  --  12.4  HCT 39.4  --  38.7  --  37.4  PLT 105*  --  100*  --  89*  LABPROT 33.3*  --  33.7* 25.8*  --   INR 3.18  --  3.22 2.31  --   CREATININE 0.77  --  0.77  --  0.70  TROPONINI 0.03* 0.03* 0.03*  --   --     Estimated Creatinine Clearance: 81.9 mL/min (by C-G formula based on SCr of 0.7 mg/dL).  Assessment: 65yof on coumadin pta for afib. Initially resumed (received a dose last night) but now pharmacy asked to hold and bridge with heparin when INR < 2 anticipating cath. INR 2.31 today.  Goal of Therapy:  Heparin level 0.3-0.7 units/ml Monitor platelets by anticoagulation protocol: Yes   Plan:  1) Daily INR, begin heparin when <  2  Fredrik Rigger 10/02/2016,1:27 PM

## 2016-10-02 NOTE — Progress Notes (Signed)
Family Medicine Teaching Service Daily Progress Note Intern Pager: [phone]  [name] Medical record number: [id number] Date of birth: February 12, 1951 Age: 65 y.o. Gender: female  Primary Care Provider: Uvaldo RisingFLETKE, KYLE, J, MD Consultants: Heart failure team Code Status: Full   Pt Overview and Major Events to Date:  10/17: Echo  Assessment and Plan: Misty GrayDolly J Gosse is a 65 y.o. female presenting with shortness of breath. PMH is significant for HFrEF (EF 30-35%), A-fib (on Coumadin), HTN, chronic venous insufficiency.  # Acute exacerbation of HFrEF:  Urine output overnight was 1200ml on 80 IV lasix bid and 500 ml since this morning.   --Continue diuresis with Lasix 80 mg IV BID --Holding Enalapril and Spironolactone for now.  --Hold Metoprolol succinate  --Continue Digoxin 0.125mg  daily --Daily weights --Strict I/O --O2 therapy prn to keep O2 sats > 92%  #Hypotension with history of Hypertension  Likely related to reduced EF. This morning BP is 99/61. --Continue to monitor BPs closely --Holding Enalapril and Spironolactone --Hold Metoprolol succinate 12.5 mg daily  --Continue Digoxin 0.125mg  daily.  #Atrial Fibrillation, rate-controlled Well controlled, currently rate is 75 --Hold Metoprolol succinate --Continue Digoxin 0.125mg  daily --Hold Coumadin possible left heart cath  #Chronic Venous Insufficiency, ongoing Patient was wering bilateral unna boots, which were removed in clinic yesterday with  Has been treated many times for cellulitis, but I do not think she has active cellulitis at this time. She feels that her venous stasis ulcers are improving with the unna boots. --She will need unna boots replaced prior to discharge  #OSA Echo on 10/17 showed worsening pulmonary hypertension. --Continue CPAP at night  --Oxygen and concentrator have been ordered --She will likely need a new CPAP machine ordered when she is discharged.  FEN/GI: Heart healthy  diet Prophylaxis: On Coumadin for A-fib  Disposition: Pending response to IV Lasix. Anticipate discharge home in 3-4 days.  Subjective:  No acute events overnight. Patient continue to improve, breathing is better. She continue to monitor her fluid intake and has a better understanding. Patient denies any shortness of breath and is tolerating po. She denies any cough, fever or chills.  Objective: Temp:  [97.6 F (36.4 C)-98.2 F (36.8 C)] 97.8 F (36.6 C) (10/18 0307) Pulse Rate:  [65-85] 75 (10/18 0307) Resp:  [18-21] 20 (10/18 0307) BP: (92-102)/(56-77) 99/61 (10/18 0307) SpO2:  [95 %-96 %] 96 % (10/18 0307) Physical Exam:  General: well appeared this morning, in NAD, answering questions appropriately Eyes: EOMI, PERRLA, no scleral icterus ENTM: Oropharynx clear, mildly dry mucous membranes Neck: Supple, no masses, no JVD Cardiovascular: Irregularly irregular rhythm, no murmurs Respiratory: Clear to auscultation bilaterally, mild crackles discerned on exam, no wheezing Gastrointestinal: +BS, soft, non-tender, non-distended MSK: chronic hyperpigmentation of the lower extremities bilaterally, 2+ pitting edema to the knee R>L, small circular open lesion present on the right anterior shin with serous drainage Derm: chronic skin changes as above, no other rashes or lesions on exposed skin Neuro: Awake, alert, oriented, CN 2-12 grossly intact Psych: Normal behavior, normal affect  Laboratory:  Recent Labs Lab 09/30/16 1606 10/01/16 0336  WBC 4.1 4.4  HGB 13.1 12.8  HCT 39.4 38.7  PLT 105* 100*    Recent Labs Lab 09/30/16 1606 10/01/16 0336  NA 138 138  K 3.1* 3.4*  CL 101 104  CO2 30 27  BUN 14 15  CREATININE 0.77 0.77  CALCIUM 9.2 8.9  GLUCOSE 141* 93    Imaging/Diagnostic Tests: X-ray Chest Pa And Lateral  Result Date: 10/01/2016 CLINICAL DATA:  Shortness of breath. EXAM: CHEST  2 VIEW COMPARISON:  09/15/2016. FINDINGS: Cardiac pacer in stable position.  Cardiomegaly with pulmonary vascular prominence and bilateral interstitial prominence noted consistent with congestive heart failure. Low lung volumes with bibasilar atelectasis . Small left pleural effusion. No pneumothorax. IMPRESSION: 1. Cardiac pacer stable position. Cardiomegaly with bilateral pulmonary interstitial prominence and left pleural effusion consistent with congestive heart failure. 2. Low lung volumes with bibasilar atelectasis. Electronically Signed   By: Maisie Fus  Register   On: 10/01/2016 06:58    Lovena Neighbours, MD 10/02/2016, 6:18 AM PGY-1, [facility] Family Medicine FPTS Intern pager: [phone], text pages welcome

## 2016-10-02 NOTE — Progress Notes (Signed)
Patient referral sent via email for [facility].

## 2016-10-02 NOTE — Consult Note (Signed)
   [facility] Inpatient Consult   10/02/2016  ELNA BRIESKE 19-Aug-1951 [id number]  Referral received for HF follow up post hospitalization.  Chart reviewed and saw patient at the bedside.  Patient is a 65 y.o.female with h/o morbid obesity, chronic AF with tachy/brady s/p pacer, HTN, HL, OSA and chronic systolic HF who was admitted for recurrent HF symptoms and bilateral LE cellulitis.  Patient verbalizes that she does her a tele-monitoring scale at home from her [facility] in which she uses the IPAD but had been weighing at the wrong times in the day.  She admits she does not eat right but is going to do better with her monitoring and follow up.  She does not have trouble with her pharmacy or medications.  She has transportation with family.  Consent form signed and copy of [facility] Care Management folder given.  Patient will have follow up as well with [facility].  Explained that [facility] Care Management does not interfere with or replace any services arranged or needed for community follow up.  For questions, please contact:  Charlesetta Shanks, RN BSN CCM [facility]  [phone] business mobile phone Toll free office [phone]

## 2016-10-02 NOTE — Progress Notes (Signed)
Advanced Heart Failure Rounding Note   Subjective:    Says she feels some better. Breathing easier. Has had moderate diuresis. Weight down less than a pound though.   Objective:   Weight Range:  Vital Signs:   Temp:  [97.6 F (36.4 C)-97.8 F (36.6 C)] 97.6 F (36.4 C) (10/18 1940) Pulse Rate:  [69-75] 69 (10/18 1940) Resp:  [18-21] 18 (10/18 1940) BP: (96-108)/(58-61) 108/59 (10/18 1940) SpO2:  [96 %-100 %] 100 % (10/18 1940) Last BM Date: 10/01/16  Weight change: Filed Weights   09/30/16 1619 10/01/16 0422  Weight: 108.2 kg (238 lb 8 oz) 107.9 kg (237 lb 14.4 oz)    Intake/Output:   Intake/Output Summary (Last 24 hours) at 10/02/16 2339 Last data filed at 10/02/16 2042  Gross per 24 hour  Intake              600 ml  Output             4000 ml  Net            -3400 ml     Physical Exam: General:  Chronically ill-appearing NAD. Sitting in recliner HEENT: normal Neck: supple. JVP jaw . Carotids 2+ bilat; no bruits. No lymphadenopathy or thryomegaly appreciated. Cor: PMI nonpalpable Irregular rate & rhythm. 2/6 TR Lungs: crackles at bases Abdomen: obese + abdominal hernial. soft, nontender, mildly distended. No bruits or masses. Good bowel sounds. Extremities: no cyanosis, clubbing, rash, 2+ edema into thighs. Less erythematous Neuro: alert & orientedx3, cranial nerves grossly intact. moves all 4 extremities w/o difficulty. Affect pleasant   Telemetry: AF with intermittent v-pacing 60-70s  Labs: Basic Metabolic Panel:  Recent Labs Lab 09/30/16 1606 10/01/16 0336 10/02/16 0603  NA 138 138 135  K 3.1* 3.4* 3.3*  CL 101 104 99*  CO2 30 27 28   GLUCOSE 141* 93 91  BUN 14 15 14   CREATININE 0.77 0.77 0.70  CALCIUM 9.2 8.9 8.9    Liver Function Tests: No results for input(s): AST, ALT, ALKPHOS, BILITOT, PROT, ALBUMIN in the last 168 hours. No results for input(s): LIPASE, AMYLASE in the last 168 hours. No results for input(s): AMMONIA in the last  168 hours.  CBC:  Recent Labs Lab 09/30/16 1606 10/01/16 0336 10/02/16 0603  WBC 4.1 4.4 4.0  HGB 13.1 12.8 12.4  HCT 39.4 38.7 37.4  MCV 91.8 90.8 91.0  PLT 105* 100* 89*    Cardiac Enzymes:  Recent Labs Lab 09/30/16 1606 09/30/16 2106 10/01/16 0336  TROPONINI 0.03* 0.03* 0.03*    BNP: BNP (last 3 results)  Recent Labs  09/15/16 1124 09/30/16 1606  BNP 1,140.6* 927.9*    ProBNP (last 3 results) No results for input(s): PROBNP in the last 8760 hours.    Other results:  Imaging:  No results found.   Medications:     Scheduled Medications: . digoxin  0.125 mg Oral Daily  . furosemide  80 mg Intravenous Q12H  . metolazone  5 mg Oral Daily  . pantoprazole  40 mg Oral Daily  . PHENobarbital  64.8 mg Oral BID  . potassium chloride SA  20 mEq Oral BID  . [START ON 10/03/2016] spironolactone  25 mg Oral Daily     Infusions:     PRN Medications:  acetaminophen **OR** acetaminophen, fluticasone, ondansetron **OR** ondansetron (ZOFRAN) IV, polyethylene glycol   Assessment:   1. Acute on chronic systolic HF   --ECHO 10/01/16 EF 30-35% with inferior AK 2. LE cellulits  3. Morbid obesity 4. HTN 5. HL 6. Chronic AF with tach brady s/p pacemaker 7. Hypokalemia   Plan/Discussion:     Her volume status is improving slowly but she is still overloaded.. Will continue lasix 80 IV bid and add metolazone. Supp K+. Will add low-dose losartan.   Long talk about need for R/L cath to look for underlying CAD. She is now agreeable. Will proceed hen INR <= 1.7. Start heparin when INR < 2.0. She has PPM on right and says that access was limited in left subclavian so we will plan femoral approach.   Has refused ICR or CRT upgrade in past.   Length of Stay: 2   [name], Daniel MD 10/02/2016, 11:39 PM  Advanced Heart Failure Team Pager [phone] (M-F; 7a - 4p)  Please contact [facility] for night-coverage after hours (4p -7a ) and weekends on  [email or web address]  

## 2016-10-02 NOTE — Progress Notes (Signed)
Pt refused CPAP qhs.  Pt states she was unable to tolerate it last night.  Pt wishes for machine to remain in room in case she changes her mind.  Education provided.  RT will continue to monitor as needed.

## 2016-10-03 ENCOUNTER — Encounter (HOSPITAL_COMMUNITY)
Admission: AD | Disposition: A | Discharge status: Home / Self Care | Payer: Self-pay | Source: Ambulatory Visit | Attending: Family Medicine

## 2016-10-03 DIAGNOSIS — I493 Ventricular premature depolarization: Secondary | ICD-10-CM

## 2016-10-03 HISTORY — PX: CARDIAC CATHETERIZATION: SHX172

## 2016-10-03 LAB — POCT I-STAT 3, VENOUS BLOOD GAS (G3P V)
Acid-Base Excess: 12 mmol/L — ABNORMAL HIGH (ref 0.0–2.0)
Acid-Base Excess: 9 mmol/L — ABNORMAL HIGH (ref 0.0–2.0)
BICARBONATE: 36.8 mmol/L — AB (ref 20.0–28.0)
Bicarbonate: 33.5 mmol/L — ABNORMAL HIGH (ref 20.0–28.0)
O2 SAT: 70 %
O2 Saturation: 63 %
PCO2 VEN: 45.1 mmHg (ref 44.0–60.0)
PH VEN: 7.52 — AB (ref 7.250–7.430)
PO2 VEN: 30 mmHg — AB (ref 32.0–45.0)
TCO2: 35 mmol/L (ref 0–100)
TCO2: 38 mmol/L (ref 0–100)
pCO2, Ven: 43.5 mmHg — ABNORMAL LOW (ref 44.0–60.0)
pH, Ven: 7.494 — ABNORMAL HIGH (ref 7.250–7.430)
pO2, Ven: 34 mmHg (ref 32.0–45.0)

## 2016-10-03 LAB — PROTIME-INR
INR: 1.9
INR: 1.62
Prothrombin Time: 22.1 seconds — ABNORMAL HIGH (ref 11.4–15.2)
Prothrombin Time: 19.4 seconds — ABNORMAL HIGH (ref 11.4–15.2)

## 2016-10-03 LAB — BASIC METABOLIC PANEL
Anion gap: 7 (ref 5–15)
BUN: 16 mg/dL (ref 6–20)
CHLORIDE: 98 mmol/L — AB (ref 101–111)
CO2: 33 mmol/L — AB (ref 22–32)
CREATININE: 0.91 mg/dL (ref 0.44–1.00)
Calcium: 9.4 mg/dL (ref 8.9–10.3)
GFR calc Af Amer: >60 mL/min (ref >60)
GFR calc non Af Amer: >60 mL/min (ref >60)
Glucose, Bld: 96 mg/dL (ref 65–99)
Potassium: 3.5 mmol/L (ref 3.5–5.1)
SODIUM: 138 mmol/L (ref 135–145)

## 2016-10-03 LAB — POCT I-STAT 3, ART BLOOD GAS (G3+)
ACID-BASE EXCESS: 8 mmol/L — AB (ref 0.0–2.0)
BICARBONATE: 31 mmol/L — AB (ref 20.0–28.0)
O2 Saturation: 97 %
TCO2: 32 mmol/L (ref 0–100)
pCO2 arterial: 35 mmHg (ref 32.0–48.0)
pH, Arterial: 7.555 — ABNORMAL HIGH (ref 7.350–7.450)
pO2, Arterial: 77 mmHg — ABNORMAL LOW (ref 83.0–108.0)

## 2016-10-03 SURGERY — RIGHT/LEFT HEART CATH AND CORONARY ANGIOGRAPHY

## 2016-10-03 MED ORDER — POTASSIUM CHLORIDE CRYS ER 20 MEQ PO TBCR
40.0000 meq | EXTENDED_RELEASE_TABLET | Freq: Two times a day (BID) | ORAL | Status: DC
  Administered 2016-10-03 – 2016-10-06 (×6): 40 meq via ORAL
  Filled 2016-10-03 (×6): qty 2

## 2016-10-03 MED ORDER — LIDOCAINE HCL (PF) 1 % IJ SOLN
INTRAMUSCULAR | Status: AC
  Filled 2016-10-03: qty 30

## 2016-10-03 MED ORDER — LIDOCAINE HCL (PF) 1 % IJ SOLN
INTRAMUSCULAR | Status: DC | PRN
  Administered 2016-10-03: 20 mL

## 2016-10-03 MED ORDER — HEPARIN (PORCINE) IN NACL 2-0.9 UNIT/ML-% IJ SOLN
INTRAMUSCULAR | Status: AC
  Filled 2016-10-03: qty 1000

## 2016-10-03 MED ORDER — ACETAMINOPHEN 325 MG PO TABS
650.0000 mg | ORAL_TABLET | ORAL | Status: DC | PRN
  Administered 2016-10-03: 650 mg via ORAL

## 2016-10-03 MED ORDER — SODIUM CHLORIDE 0.9% FLUSH
3.0000 mL | Freq: Two times a day (BID) | INTRAVENOUS | Status: DC
  Administered 2016-10-04 – 2016-10-07 (×5): 3 mL via INTRAVENOUS

## 2016-10-03 MED ORDER — SODIUM CHLORIDE 0.9% FLUSH
3.0000 mL | INTRAVENOUS | Status: DC | PRN
  Administered 2016-10-03: 3 mL via INTRAVENOUS
  Filled 2016-10-03: qty 3

## 2016-10-03 MED ORDER — HEPARIN (PORCINE) IN NACL 100-0.45 UNIT/ML-% IJ SOLN
1250.0000 [IU]/h | INTRAMUSCULAR | Status: DC
  Administered 2016-10-03: 1250 [IU]/h via INTRAVENOUS
  Filled 2016-10-03: qty 250

## 2016-10-03 MED ORDER — WARFARIN SODIUM 10 MG PO TABS
10.0000 mg | ORAL_TABLET | Freq: Once | ORAL | Status: AC
  Administered 2016-10-03: 10 mg via ORAL
  Filled 2016-10-03: qty 1

## 2016-10-03 MED ORDER — SODIUM CHLORIDE 0.9 % IV SOLN
INTRAVENOUS | Status: DC | PRN
  Administered 2016-10-03: 10 mL/h via INTRAVENOUS

## 2016-10-03 MED ORDER — WARFARIN - PHARMACIST DOSING INPATIENT
Freq: Every day | Status: DC
  Administered 2016-10-04 – 2016-10-06 (×2)

## 2016-10-03 MED ORDER — ONDANSETRON HCL 4 MG/2ML IJ SOLN: 4.0000 mg | Freq: Four times a day (QID) | INTRAMUSCULAR | Status: DC | PRN

## 2016-10-03 MED ORDER — SODIUM CHLORIDE 0.9 % IV SOLN: 250.0000 mL | INTRAVENOUS | Status: DC | PRN

## 2016-10-03 MED ORDER — IOPAMIDOL (ISOVUE-370) INJECTION 76%
INTRAVENOUS | Status: DC | PRN
  Administered 2016-10-03: 30 mL via INTRA_ARTERIAL

## 2016-10-03 MED ORDER — IOPAMIDOL (ISOVUE-370) INJECTION 76%
INTRAVENOUS | Status: AC
  Filled 2016-10-03: qty 100

## 2016-10-03 MED ORDER — HEPARIN (PORCINE) IN NACL 2-0.9 UNIT/ML-% IJ SOLN
INTRAMUSCULAR | Status: DC | PRN
Start: 2016-10-03 — End: 2016-10-03
  Administered 2016-10-03: 1000 mL

## 2016-10-03 MED ORDER — METOLAZONE 5 MG PO TABS
5.0000 mg | ORAL_TABLET | Freq: Two times a day (BID) | ORAL | Status: DC
  Administered 2016-10-03 – 2016-10-05 (×5): 5 mg via ORAL
  Filled 2016-10-03 (×4): qty 1

## 2016-10-03 MED ORDER — HEPARIN (PORCINE) IN NACL 100-0.45 UNIT/ML-% IJ SOLN
1350.0000 [IU]/h | INTRAMUSCULAR | Status: DC
  Administered 2016-10-04: 1400 [IU]/h via INTRAVENOUS
  Administered 2016-10-04: 1200 [IU]/h via INTRAVENOUS
  Filled 2016-10-03 (×3): qty 250

## 2016-10-03 MED ORDER — SODIUM CHLORIDE 0.9 % IV SOLN
INTRAVENOUS | Status: DC
  Administered 2016-10-03: 18:00:00 via INTRAVENOUS

## 2016-10-03 SURGICAL SUPPLY — 9 items
CATH INFINITI 5FR MULTPACK ANG (CATHETERS) ×1 IMPLANT
CATH SWAN GANZ 7F STRAIGHT (CATHETERS) ×1 IMPLANT
KIT HEART LEFT (KITS) ×2 IMPLANT
PACK CARDIAC CATHETERIZATION (CUSTOM PROCEDURE TRAY) ×2 IMPLANT
SHEATH PINNACLE 5F 10CM (SHEATH) ×1 IMPLANT
SHEATH PINNACLE 7F 10CM (SHEATH) ×1 IMPLANT
TRANSDUCER W/STOPCOCK (MISCELLANEOUS) ×3 IMPLANT
WIRE EMERALD 3MM-J .025X260CM (WIRE) ×1 IMPLANT
WIRE EMERALD 3MM-J .035X150CM (WIRE) ×2 IMPLANT

## 2016-10-03 NOTE — Progress Notes (Signed)
Family Medicine Teaching Service Daily Progress Note Intern Pager: [phone]  [name] Medical record number: [id number] Date of birth: 04-12-1951 Age: 65 y.o. Gender: female  Primary Care Provider: Uvaldo Rising, MD Consultants: Heart failure team Code Status: Full   Pt Overview and Major Events to Date:  10/17: Echo  Assessment and Plan: Misty Gray is a 65 y.o. female presenting with shortness of breath. PMH is significant for HFrEF (EF 30-35%), A-fib (on Coumadin), HTN, chronic venous insufficiency.  # Acute exacerbation of HFrEF:  Patient started on metolazone 5mg  yesterday by heart failure team. Urine output has substantially increase 4800 ml in the past 24 hrs.  --Continue diuresis with Lasix 80 mg IV BID --Added Metolazone 5 mg daily  --Holding Enalapril  --Restart Spironolactone at 25 mg daily  --Continue to hold Metoprolol succinate  --Continue Digoxin 0.125mg  daily --Replete K --Daily weights --Strict I/O --O2 therapy prn to keep O2 sats > 92%  #Hypotension with history of Hypertension  Likely related to reduced EF. This morning BP is 104/73. --Continue to monitor BPs closely --Holding Enalapril  --Restarted Spironolactone 25 mg daily --Hold Metoprolol succinate 12.5 mg daily  --Continue Digoxin 0.125mg  daily  #Atrial Fibrillation, rate-controlled Patient agree to R/L cath. Well controlled, currently rate is 78. Will undergo procedure when INR <1.7.  --Hold Metoprolol succinate --Continue Digoxin 0.125mg  daily --Bridging to heparin 1,250 u/hr  #Chronic Venous Insufficiency, ongoing Patient was wering bilateral unna boots, which were removed in clinic yesterday with  Has been treated many times for cellulitis, but I do not think she has active cellulitis at this time. She feels that her venous stasis ulcers are improving with the unna boots. --She will need unna boots replaced prior to discharge  #OSA Echo on 10/17 showed worsening  pulmonary hypertension. --Continue CPAP at night  --Oxygen and concentrator have been ordered --She will likely need a new CPAP machine ordered when she is discharged.  FEN/GI: Heart healthy diet Prophylaxis: On Coumadin for A-fib  Disposition: Pending response to IV Lasix. Anticipate discharge home in 3-4 days.  Subjective:  No acute events overnight. Patient continue to diurese well overnight, she is tolerating diet and is ready for her left heart cath. Patient has been out of bed and sitting in chair. She denies any chest pain and endorse improvement in her breathing from admission.  Objective: Temp:  [97.6 F (36.4 C)-98.1 F (36.7 C)] 98.1 F (36.7 C) (10/19 0038) Pulse Rate:  [69-78] 78 (10/19 0038) Resp:  [18] 18 (10/19 0038) BP: (96-108)/(58-73) 104/73 (10/19 0038) SpO2:  [100 %] 100 % (10/19 0038) Physical Exam:  General: well appeared this morning, in NAD, answering questions appropriately Eyes: EOMI, PERRLA, no scleral icterus ENTM: Oropharynx clear, mildly dry mucous membranes Neck: Supple, no masses, no JVD Cardiovascular: Irregularly irregular rhythm, no murmurs Respiratory: Clear to auscultation bilaterally, mild crackles discerned on exam, no wheezing Gastrointestinal: +BS, soft, non-tender, non-distended MSK: chronic hyperpigmentation of the lower extremities bilaterally, 1+ pitting edema to the knee R>L, small circular open lesion present on the right anterior shin with serous drainage Neuro: Alert and oriented x4, CN 2-12 grossly intact Psych: Normal behavior, normal affect  Laboratory:  Recent Labs Lab 09/30/16 1606 10/01/16 0336 10/02/16 0603  WBC 4.1 4.4 4.0  HGB 13.1 12.8 12.4  HCT 39.4 38.7 37.4  PLT 105* 100* 89*    Recent Labs Lab 10/01/16 0336 10/02/16 0603 10/03/16 0424  NA 138 135 138  K 3.4* 3.3* 3.5  CL 104 99* 98*  CO2 27 28 33*  BUN 15 14 16   CREATININE 0.77 0.70 0.91  CALCIUM 8.9 8.9 9.4  GLUCOSE 93 91 96     Imaging/Diagnostic Tests: No results found.  Lovena NeighboursAbdoulaye Zarin Knupp, MD 10/03/2016, 6:59 AM PGY-1, [facility] FPTS Intern pager: [phone], text pages welcome

## 2016-10-03 NOTE — Progress Notes (Signed)
ANTICOAGULATION CONSULT NOTE Pharmacy Consult for Heparin  Indication: atrial fibrillation  Allergies  Allergen Reactions  . Aspirin Hives and Rash  . Penicillins Rash and Other (See Comments)    Amoxicillin also, Has patient had a PCN reaction causing immediate rash, facial/tongue/throat swelling, SOB or lightheadedness with hypotension: Yes Has patient had a PCN reaction causing severe rash involving mucus membranes or skin necrosis: No Has patient had a PCN reaction that required hospitalization No Has patient had a PCN reaction occurring within the last 10 years: Yes If all of the above answers are "NO", then may proceed with Cephalosporin use.   . Tape Other (See Comments)    NO PAPER TAPE-MUST BE MEDICAL TAPE  . Wool Alcohol [Lanolin] Hives  . Amoxicillin Rash  . Ampicillin Itching and Rash    Patient Measurements: Height: 5\' 4"  (162.6 cm) Weight: 237 lb 14.4 oz (107.9 kg) (scale a) IBW/kg (Calculated) : 54.7 Heparin Dosing Weight: 80kg  Vital Signs: Temp: 98.1 F (36.7 C) (10/19 0038) Temp Source: Oral (10/19 0038) BP: 104/73 (10/19 0038) Pulse Rate: 78 (10/19 0038)  Labs:  Recent Labs  09/30/16 1606 09/30/16 2106 10/01/16 0336 10/02/16 0335 10/02/16 0603 10/03/16 0424  HGB 13.1  --  12.8  --  12.4  --   HCT 39.4  --  38.7  --  37.4  --   PLT 105*  --  100*  --  89*  --   LABPROT 33.3*  --  33.7* 25.8*  --  22.1*  INR 3.18  --  3.22 2.31  --  1.90  CREATININE 0.77  --  0.77  --  0.70 0.91  TROPONINI 0.03* 0.03* 0.03*  --   --   --     Estimated Creatinine Clearance: 72 mL/min (by C-G formula based on SCr of 0.91 mg/dL).  Assessment: 65 y.o. female with Afib, Coumadin on hold and INR subtherapeutic, for heparin  Goal of Therapy:  Heparin level 0.3-0.7 units/ml Monitor platelets by anticoagulation protocol: Yes   Plan:  Start heparin 1250 units/hr Check heparin level in 8 hours.   Eddie Candle 10/03/2016,6:07 AM

## 2016-10-03 NOTE — Evaluation (Signed)
Physical Therapy Evaluation Patient Details Name: Misty Gray MRN: [id number] DOB: 05-29-1951 Today's Date: 10/03/2016   History of Present Illness  Misty Gray is a 65 y.o. female presenting with shortness of breath. PMH is significant for HFrEF (EF 30-35%), A-fib (on Coumadin), HTN, chronic venous insufficiency.  Clinical Impression  Pt presents with general deconditioning and SOB with activity. Pt is able to perform bed mobility and transfer with mod. Independence. Pt requires Supervision with gait at this time due to SOB with activity and fatigue. Pt is able to walk approximitely 60 ft with supervision without an assistive device. Pt reports her husband will be able to provide some assistance as needed at home. Pt would benefit from acute skilled PT to maximize mobility and independence for return home with follow-up HHPT.    Follow Up Recommendations Home health PT    Equipment Recommendations  None recommended by PT    Recommendations for Other Services OT consult     Precautions / Restrictions Precautions Precautions: Fall Restrictions Weight Bearing Restrictions: No      Mobility  Bed Mobility Overal bed mobility: Modified Independent                Transfers Overall transfer level: Modified independent Equipment used: None                Ambulation/Gait Ambulation/Gait assistance: Supervision Ambulation Distance (Feet): 55 Feet Assistive device: None Gait Pattern/deviations: Step-through pattern;Decreased stride length;Wide base of support;Trunk flexed   Gait velocity interpretation: Below normal speed for age/gender    Stairs            Wheelchair Mobility    Modified Rankin (Stroke Patients Only)       Balance Overall balance assessment: Needs assistance Sitting-balance support: No upper extremity supported Sitting balance-Leahy Scale: Good     Standing balance support: No upper extremity supported Standing balance-Leahy  Scale: Fair                               Pertinent Vitals/Pain Pain Assessment: No/denies pain    Home Living Family/patient expects to be discharged to:: Private residence Living Arrangements: Spouse/significant other Available Help at Discharge: Family Type of Home: House Home Access: Stairs to enter Entrance Stairs-Rails: Right Entrance Stairs-Number of Steps: 2 Home Layout: Two level Home Equipment: Cane - single point      Prior Function Level of Independence: Independent               Hand Dominance        Extremity/Trunk Assessment   Upper Extremity Assessment: Defer to OT evaluation           Lower Extremity Assessment: Generalized weakness      Cervical / Trunk Assessment: Kyphotic  Communication   Communication: No difficulties  Cognition Arousal/Alertness: Awake/alert Behavior During Therapy: WFL for tasks assessed/performed Overall Cognitive Status: Within Functional Limits for tasks assessed                      General Comments General comments (skin integrity, edema, etc.): increased edema bilateral LEs    Exercises     Assessment/Plan    PT Assessment Patient needs continued PT services  PT Problem List Decreased activity tolerance;Decreased range of motion;Decreased balance;Decreased mobility;Decreased safety awareness;Cardiopulmonary status limiting activity          PT Treatment Interventions DME instruction;Gait training;Stair training;Therapeutic activities;Therapeutic exercise;Balance training;Patient/family education;Functional mobility  training    PT Goals (Current goals can be found in the Care Plan section)  Acute Rehab PT Goals Patient Stated Goal: To walk and go home PT Goal Formulation: With patient Time For Goal Achievement: 10/17/16 Potential to Achieve Goals: Good    Frequency Min 3X/week   Barriers to discharge        Co-evaluation               End of Session Equipment  Utilized During Treatment: Gait belt Activity Tolerance: Patient limited by fatigue Patient left: in bed;with call bell/phone within reach;with nursing/sitter in room Nurse Communication: Mobility status         Time: 6153-7943 PT Time Calculation (min) (ACUTE ONLY): 29 min   Charges:   PT Evaluation $PT Eval Moderate Complexity: 1 Procedure PT Treatments $Gait Training: 8-22 mins   PT G Codes:        Greggory Stallion 10/03/2016, 11:38 AM

## 2016-10-03 NOTE — Interval H&P Note (Signed)
History and Physical Interval Note:  10/03/2016 3:10 PM  Misty Gray  has presented today for surgery, with the diagnosis of hf  The various methods of treatment have been discussed with the patient and family. After consideration of risks, benefits and other options for treatment, the patient has consented to  Procedure(s): Right/Left Heart Cath and Coronary Angiography (N/A) and possible coronary angioplasty as a surgical intervention .  The patient's history has been reviewed, patient examined, no change in status, stable for surgery.  I have reviewed the patient's chart and labs.  Questions were answered to the patient's satisfaction.    Cath Lab Visit (complete for each Cath Lab visit)  Clinical Evaluation Leading to the Procedure:   ACS: No.  Non-ACS:    Anginal Classification: CCS III  Anti-ischemic medical therapy: Minimal Therapy (1 class of medications)  Non-Invasive Test Results: No non-invasive testing performed  Prior CABG: No previous CABG       [name], Reuel Boom

## 2016-10-03 NOTE — H&P (View-Only) (Signed)
Advanced Heart Failure Rounding Note   Subjective:    Says she feels some better. Breathing easier. Has had moderate diuresis. Weight down less than a pound though.   Objective:   Weight Range:  Vital Signs:   Temp:  [97.6 F (36.4 C)-97.8 F (36.6 C)] 97.6 F (36.4 C) (10/18 1940) Pulse Rate:  [69-75] 69 (10/18 1940) Resp:  [18-21] 18 (10/18 1940) BP: (96-108)/(58-61) 108/59 (10/18 1940) SpO2:  [96 %-100 %] 100 % (10/18 1940) Last BM Date: 10/01/16  Weight change: Filed Weights   09/30/16 1619 10/01/16 0422  Weight: 108.2 kg (238 lb 8 oz) 107.9 kg (237 lb 14.4 oz)    Intake/Output:   Intake/Output Summary (Last 24 hours) at 10/02/16 2339 Last data filed at 10/02/16 2042  Gross per 24 hour  Intake              600 ml  Output             4000 ml  Net            -3400 ml     Physical Exam: General:  Chronically ill-appearing NAD. Sitting in recliner HEENT: normal Neck: supple. JVP jaw . Carotids 2+ bilat; no bruits. No lymphadenopathy or thryomegaly appreciated. Cor: PMI nonpalpable Irregular rate & rhythm. 2/6 TR Lungs: crackles at bases Abdomen: obese + abdominal hernial. soft, nontender, mildly distended. No bruits or masses. Good bowel sounds. Extremities: no cyanosis, clubbing, rash, 2+ edema into thighs. Less erythematous Neuro: alert & orientedx3, cranial nerves grossly intact. moves all 4 extremities w/o difficulty. Affect pleasant   Telemetry: AF with intermittent v-pacing 60-70s  Labs: Basic Metabolic Panel:  Recent Labs Lab 09/30/16 1606 10/01/16 0336 10/02/16 0603  NA 138 138 135  K 3.1* 3.4* 3.3*  CL 101 104 99*  CO2 30 27 28   GLUCOSE 141* 93 91  BUN 14 15 14   CREATININE 0.77 0.77 0.70  CALCIUM 9.2 8.9 8.9    Liver Function Tests: No results for input(s): AST, ALT, ALKPHOS, BILITOT, PROT, ALBUMIN in the last 168 hours. No results for input(s): LIPASE, AMYLASE in the last 168 hours. No results for input(s): AMMONIA in the last  168 hours.  CBC:  Recent Labs Lab 09/30/16 1606 10/01/16 0336 10/02/16 0603  WBC 4.1 4.4 4.0  HGB 13.1 12.8 12.4  HCT 39.4 38.7 37.4  MCV 91.8 90.8 91.0  PLT 105* 100* 89*    Cardiac Enzymes:  Recent Labs Lab 09/30/16 1606 09/30/16 2106 10/01/16 0336  TROPONINI 0.03* 0.03* 0.03*    BNP: BNP (last 3 results)  Recent Labs  09/15/16 1124 09/30/16 1606  BNP 1,140.6* 927.9*    ProBNP (last 3 results) No results for input(s): PROBNP in the last 8760 hours.    Other results:  Imaging:  No results found.   Medications:     Scheduled Medications: . digoxin  0.125 mg Oral Daily  . furosemide  80 mg Intravenous Q12H  . metolazone  5 mg Oral Daily  . pantoprazole  40 mg Oral Daily  . PHENobarbital  64.8 mg Oral BID  . potassium chloride SA  20 mEq Oral BID  . [START ON 10/03/2016] spironolactone  25 mg Oral Daily     Infusions:     PRN Medications:  acetaminophen **OR** acetaminophen, fluticasone, ondansetron **OR** ondansetron (ZOFRAN) IV, polyethylene glycol   Assessment:   1. Acute on chronic systolic HF   --ECHO 10/01/16 EF 30-35% with inferior AK 2. LE cellulits  3. Morbid obesity 4. HTN 5. HL 6. Chronic AF with tach brady s/p pacemaker 7. Hypokalemia   Plan/Discussion:     Her volume status is improving slowly but she is still overloaded.. Will continue lasix 80 IV bid and add metolazone. Supp K+. Will add low-dose losartan.   Long talk about need for R/L cath to look for underlying CAD. She is now agreeable. Will proceed hen INR <= 1.7. Start heparin when INR < 2.0. She has PPM on right and says that access was limited in left subclavian so we will plan femoral approach.   Has refused ICR or CRT upgrade in past.   Length of Stay: 2   Shaira Sova MD 10/02/2016, 11:39 PM  Advanced Heart Failure Team Pager [phone] (M-F; 7a - 4p)  Please contact [facility] for night-coverage after hours (4p -7a ) and weekends on  [email or web address]

## 2016-10-03 NOTE — CV Procedure (Addendum)
Cardiac cath report:  Findings:  Ao = 94/58 (72) LV = 104/20 RA = 21 RV = 65/4/19 PA = 65/21 (38) PCW = 23 Fick cardiac output/index = 6.1/2.9 PVR = 2.5 WU FA sat = 97%  PA sat = 69%, 70%  Assessment: 1. Normal coronary arteries 2. NICM with EF 30-35% by echo 3. Persistently elevated biventricular pressures  Plan/Discussion:  Continue diuresis. Suspect NICM may be due to frequent PVCs or RV pacing. Will interrogate pacer in am. Can restart warfarin tonight.  [name], Daniel,MD 3:50 PM

## 2016-10-03 NOTE — Progress Notes (Signed)
ANTICOAGULATION CONSULT NOTE Pharmacy Consult for Heparin  Indication: atrial fibrillation  Allergies  Allergen Reactions  . Aspirin Hives and Rash  . Penicillins Rash and Other (See Comments)    Amoxicillin also, Has patient had a PCN reaction causing immediate rash, facial/tongue/throat swelling, SOB or lightheadedness with hypotension: Yes Has patient had a PCN reaction causing severe rash involving mucus membranes or skin necrosis: No Has patient had a PCN reaction that required hospitalization No Has patient had a PCN reaction occurring within the last 10 years: Yes If all of the above answers are "NO", then may proceed with Cephalosporin use.   . Tape Other (See Comments)    NO PAPER TAPE-MUST BE MEDICAL TAPE  . Wool Alcohol [Lanolin] Hives  . Amoxicillin Rash  . Ampicillin Itching and Rash    Patient Measurements: Height: 5\' 4"  (162.6 cm) Weight: 228 lb 9.6 oz (103.7 kg) IBW/kg (Calculated) : 54.7 Heparin Dosing Weight: 80kg  Vital Signs: Temp: 97.6 F (36.4 C) (10/19 1635) Temp Source: Oral (10/19 1629) BP: 109/62 (10/19 1635) Pulse Rate: 64 (10/19 1635)  Labs:  Recent Labs  09/30/16 2106  10/01/16 0336 10/02/16 0335 10/02/16 0603 10/03/16 0424 10/03/16 1135  HGB  --   --  12.8  --  12.4  --   --   HCT  --   --  38.7  --  37.4  --   --   PLT  --   --  100*  --  89*  --   --   LABPROT  --   < > 33.7* 25.8*  --  22.1* 19.4*  INR  --   < > 3.22 2.31  --  1.90 1.62  CREATININE  --   --  0.77  --  0.70 0.91  --   TROPONINI 0.03*  --  0.03*  --   --   --   --   < > = values in this interval not displayed.  Estimated Creatinine Clearance: 70.4 mL/min (by C-G formula based on SCr of 0.91 mg/dL).  Assessment: 65 y.o. female with Afib, on chronic coumadin, now s/p Cath, with normal coronary arteries, will resume coumadin tonight, with heparin bridge. INR 1.62 this morning. Hgb 12/4, pltc 89, trending down.   Coumadin PTA dose: 10mg  daily per 10/9 coumadin  clinic note.   Goal of Therapy:  INR 2-3 Heparin level 0.3-0.7 units/ml Monitor platelets by anticoagulation protocol: Yes   Plan:  - Coumadin 10mg  po x 1 - Heparin 1200 units/hr with no bolus start at midnight, ~ 8 hrs after sheath removal (1545) - Heparin level & PT/INR at 0600   Bayard Hugger, PharmD, BCPS  Clinical Pharmacist  Pager: [phone]   10/03/2016,5:17 PM

## 2016-10-03 NOTE — Progress Notes (Signed)
Advanced Heart Failure Rounding Note   Subjective:    Continues to diurese with IV lasix + metolazone. Brisk diuresis noted however weight only down 1 pound.  SOB with exertion.     Objective:   Weight Range:  Vital Signs:   Temp:  [97.4 F (36.3 C)-98.1 F (36.7 C)] 97.4 F (36.3 C) (10/19 0759) Pulse Rate:  [69-83] 83 (10/19 0759) Resp:  [16-18] 16 (10/19 0759) BP: (96-108)/(58-75) 107/75 (10/19 0759) SpO2:  [93 %-100 %] 93 % (10/19 0759) Last BM Date: 10/03/16  Weight change: Filed Weights   09/30/16 1619 10/01/16 0422  Weight: 238 lb 8 oz (108.2 kg) 237 lb 14.4 oz (107.9 kg)    Intake/Output:   Intake/Output Summary (Last 24 hours) at 10/03/16 1004 Last data filed at 10/03/16 0929  Gross per 24 hour  Intake             1060 ml  Output             5000 ml  Net            -3940 ml     Physical Exam: General:  Chronically ill-appearing NAD. Sitting in recliner HEENT: normal Neck: supple. JVP jaw . Carotids 2+ bilat; no bruits. No lymphadenopathy or thryomegaly appreciated. Cor: PMI nonpalpable Irregular rate & rhythm. 2/6 TR Lungs: crackles at bases Abdomen: obese + abdominal hernial. soft, nontender, mildly distended. No bruits or masses. Good bowel sounds. Extremities: no cyanosis, clubbing, rash, 2+ edema into thighs. R ad LLE erythema Neuro: alert & orientedx3, cranial nerves grossly intact. moves all 4 extremities w/o difficulty. Affect pleasant   Telemetry: AF with intermittent v-pacing 60-70s  Labs: Basic Metabolic Panel:  Recent Labs Lab 09/30/16 1606 10/01/16 0336 10/02/16 0603 10/03/16 0424  NA 138 138 135 138  K 3.1* 3.4* 3.3* 3.5  CL 101 104 99* 98*  CO2 30 27 28  33*  GLUCOSE 141* 93 91 96  BUN 14 15 14 16   CREATININE 0.77 0.77 0.70 0.91  CALCIUM 9.2 8.9 8.9 9.4    Liver Function Tests: No results for input(s): AST, ALT, ALKPHOS, BILITOT, PROT, ALBUMIN in the last 168 hours. No results for input(s): LIPASE, AMYLASE in the  last 168 hours. No results for input(s): AMMONIA in the last 168 hours.  CBC:  Recent Labs Lab 09/30/16 1606 10/01/16 0336 10/02/16 0603  WBC 4.1 4.4 4.0  HGB 13.1 12.8 12.4  HCT 39.4 38.7 37.4  MCV 91.8 90.8 91.0  PLT 105* 100* 89*    Cardiac Enzymes:  Recent Labs Lab 09/30/16 1606 09/30/16 2106 10/01/16 0336  TROPONINI 0.03* 0.03* 0.03*    BNP: BNP (last 3 results)  Recent Labs  09/15/16 1124 09/30/16 1606  BNP 1,140.6* 927.9*    ProBNP (last 3 results) No results for input(s): PROBNP in the last 8760 hours.    Other results:  Imaging: No results found.   Medications:     Scheduled Medications: . digoxin  0.125 mg Oral Daily  . furosemide  80 mg Intravenous Q12H  . losartan  12.5 mg Oral Daily  . metolazone  5 mg Oral Daily  . pantoprazole  40 mg Oral Daily  . PHENobarbital  64.8 mg Oral BID  . potassium chloride SA  20 mEq Oral BID  . spironolactone  25 mg Oral Daily    Infusions: . heparin 1,250 Units/hr (10/03/16 0626)    PRN Medications: acetaminophen **OR** acetaminophen, fluticasone, ondansetron **OR** ondansetron (ZOFRAN) IV, polyethylene glycol  Assessment:   1. Acute on chronic systolic HF   --ECHO 10/01/16 EF 30-35% with inferior AK 2. LE cellulits 3. Morbid obesity 4. HTN 5. HL 6. Chronic AF with tach brady s/p pacemaker 7. Hypokalemia   Plan/Discussion:    Brisk diuresis noted but still volume overloaded. Will continue lasix 80 IV bid and increase metolazone to 5 mg twice daily. Continue low losartan and spironolactone 25 mg daily. Renal function stable.   Plan for  R/L cath to look for underlying CAD. She is now agreeable. Will proceed hen INR <= 1.7. On heparin.  She has PPM on right and says that access was limited in left subclavian so we will plan femoral approach.   Has refused ICD or CRT upgrade in past.   Length of Stay: 3   [name] NP-C  10/03/2016, 10:04 AM  Advanced Heart Failure Team Pager  [phone] (M-F; 7a - 4p)  Please contact [facility] for night-coverage after hours (4p -7a ) and weekends on [email or web address]  Patient seen and examined with Tonye Becket, NP. We discussed all aspects of the encounter. I agree with the assessment and plan as stated above.   Remains volume overloaded. Agree with adding metolazone. Suspect fluid intake is higher than reported. Will plan cath later today. Supp K+.  Sharyah Bostwick, DanielMD

## 2016-10-04 ENCOUNTER — Encounter (HOSPITAL_COMMUNITY): Payer: Self-pay | Admitting: Internal Medicine

## 2016-10-04 DIAGNOSIS — R0602 Shortness of breath: Secondary | ICD-10-CM

## 2016-10-04 LAB — CBC
HCT: 40.9 % (ref 36.0–46.0)
Hemoglobin: 13.6 g/dL (ref 12.0–15.0)
MCH: 30.4 pg (ref 26.0–34.0)
MCHC: 33.3 g/dL (ref 30.0–36.0)
MCV: 91.5 fL (ref 78.0–100.0)
PLATELETS: 125 10*3/uL — AB (ref 150–400)
RBC: 4.47 MIL/uL (ref 3.87–5.11)
RDW: 15.2 % (ref 11.5–15.5)
WBC: 5.3 10*3/uL (ref 4.0–10.5)

## 2016-10-04 LAB — PROTIME-INR
INR: 1.6
PROTHROMBIN TIME: 19.2 s — AB (ref 11.4–15.2)

## 2016-10-04 LAB — BASIC METABOLIC PANEL
Anion gap: 8 (ref 5–15)
BUN: 14 mg/dL (ref 6–20)
CALCIUM: 9.9 mg/dL (ref 8.9–10.3)
CO2: 36 mmol/L — ABNORMAL HIGH (ref 22–32)
CREATININE: 0.89 mg/dL (ref 0.44–1.00)
Chloride: 91 mmol/L — ABNORMAL LOW (ref 101–111)
GFR calc non Af Amer: >60 mL/min (ref >60)
Glucose, Bld: 83 mg/dL (ref 65–99)
Potassium: 3.9 mmol/L (ref 3.5–5.1)
SODIUM: 135 mmol/L (ref 135–145)

## 2016-10-04 LAB — HEPARIN LEVEL (UNFRACTIONATED)
Heparin Unfractionated: 0.19 IU/mL — ABNORMAL LOW (ref 0.30–0.70)
Heparin Unfractionated: 0.34 IU/mL (ref 0.30–0.70)

## 2016-10-04 LAB — MAGNESIUM: MAGNESIUM: 1.8 mg/dL (ref 1.7–2.4)

## 2016-10-04 MED ORDER — FUROSEMIDE 10 MG/ML IJ SOLN
80.0000 mg | Freq: Two times a day (BID) | INTRAMUSCULAR | Status: DC
  Administered 2016-10-04 – 2016-10-05 (×3): 80 mg via INTRAVENOUS
  Filled 2016-10-04 (×3): qty 8

## 2016-10-04 MED ORDER — WARFARIN SODIUM 10 MG PO TABS
10.0000 mg | ORAL_TABLET | Freq: Once | ORAL | Status: AC
  Administered 2016-10-04: 10 mg via ORAL
  Filled 2016-10-04: qty 1

## 2016-10-04 NOTE — Progress Notes (Signed)
ANTICOAGULATION CONSULT NOTE Pharmacy Consult for Heparin  Indication: atrial fibrillation  Allergies  Allergen Reactions  . Aspirin Hives and Rash  . Penicillins Rash and Other (See Comments)    Amoxicillin also, Has patient had a PCN reaction causing immediate rash, facial/tongue/throat swelling, SOB or lightheadedness with hypotension: Yes Has patient had a PCN reaction causing severe rash involving mucus membranes or skin necrosis: No Has patient had a PCN reaction that required hospitalization No Has patient had a PCN reaction occurring within the last 10 years: Yes If all of the above answers are "NO", then may proceed with Cephalosporin use.   . Tape Other (See Comments)    NO PAPER TAPE-MUST BE MEDICAL TAPE  . Wool Alcohol [Lanolin] Hives  . Amoxicillin Rash  . Ampicillin Itching and Rash    Patient Measurements: Height: 5\' 4"  (162.6 cm) Weight: 216 lb 3.2 oz (98.1 kg) (scale a) IBW/kg (Calculated) : 54.7 Heparin Dosing Weight: 80kg  Vital Signs: Temp: 97.2 F (36.2 C) (10/20 0508) Temp Source: Oral (10/20 0508) BP: 122/69 (10/20 0508) Pulse Rate: 72 (10/20 0508)  Labs:  Recent Labs  10/02/16 0335 10/02/16 0603 10/03/16 0424 10/03/16 1135  HGB  --  12.4  --   --   HCT  --  37.4  --   --   PLT  --  89*  --   --   LABPROT 25.8*  --  22.1* 19.4*  INR 2.31  --  1.90 1.62  CREATININE  --  0.70 0.91  --     Estimated Creatinine Clearance: 68.3 mL/min (by C-G formula based on SCr of 0.91 mg/dL).  Assessment: 65 y.o. female with Afib, on chronic coumadin, now s/p Cath 10/19 with normal coronary arteries. Pharmacy consulted to resume coumadin 10/19 with heparin bridge. INR 1.6 this AM after resumed 10/19. Heparin level subtherapeutic (0.19) on restart at 1200 units/h. Hg wnl, plt up to 125. No bleed or IV line issues per RN.  Coumadin PTA dose: 10mg  daily per 10/9 coumadin clinic note.   Goal of Therapy:  INR 2-3 Heparin level 0.3-0.7 units/ml Monitor  platelets by anticoagulation protocol: Yes   Plan:  - Coumadin 10mg  po x 1 - Increase heparin to 1400 units/h - d/c when INR therapeutic - Daily heparin level/INR/CBC - Monitor for s/sx bleeding   Babs Bertin, PharmD, BCPS Clinical Pharmacist 10/04/2016 8:44 AM

## 2016-10-04 NOTE — Discharge Summary (Signed)
Family Medicine Teaching [facility] Discharge Summary  [name] Medical record number: [id number] Date of birth: 1951-09-28 Age: 65 y.o. Gender: female Date of Admission: 09/30/2016  Date of Discharge: 10/07/16 Admitting Physician: Moses Manners, MD  Primary Care Provider: Uvaldo Rising, MD Consultants: Heart failure team  Indication for Hospitalization: CHF exacerbation and shortness of breath  Discharge Diagnoses/Problem List:  CHF exacerbation HFrEF Hypertension Atrial Fibrillation Chronic venous insufficiency OSA  Disposition: Home with outpatient set up for home health PT/OT  Discharge Condition: Stable  Discharge Exam:   General: NAD. Well appearing. Speaking in full sentences. Cardiovascular: Irregularly irregular rhythm, no murmurs Respiratory: normal work of breathing. Clear to auscultation Gastrointestinal: +BS, soft, non-tender, non-distended MSK: unna boots in place Neuro: Alert and oriented x4, CN 2-12 grossly intact Psych: Normal behavior, normal affect   Brief Hospital Course:  Misty Gray a 65 y.o.femalepresenting with shortness of breath. PMH is significant for HFrEF (EF 30-35%), A-fib (on Coumadin), HTN, chronic venous insufficiency.  # Acute exacerbation of HFrEF:  Patient was admitted for CHF exacerbation, patient with bilateral crackles and pitting edema. Patient was aggressively diuresed throughout admission with IV and metolazone. Patient was down >20L and ~41lb since admission. Patient had a repeat echocardiogram on 10/17 which showed an EF 30-35% with some apical and lateral akinesis, and worsening pulmonary hypertension. Patient also had a left heart catheterization which showed normal coronary arteries and elevated biventricular pressures. Weight on discharge:197 lb  --Start 10/24 Torsemide 40 mg in the morning, 20 mg at night daily (NEW) --Continue losartan 25 mg daily (new) --Continue Spironolactone at 25 mg  daily  --Discontinue Metoprolol succinate   #Hypotension with history of Hypertension Patient was hypotensive on admission most likely secondary to reduced EF. Patient blood pressure improve during admission and she was never hypertensive throughout admission. On discharge, patient SBP in the 90's and DBP in the 60's given aggressive diuresis. Patient was never symptomatic. --Continue Spironolactone 25 mg daily --Continue losartan 25 mg daily (NEW) --Discontinue Metoprolol succinate 12.5 mg daily   #Atrial Fibrillation, rate-controlled Patient's rate was controlled throughout hospitalization. Digoxin level was checked mildly sub therapeutic. Pacer was interrogated and had 81% RV pacing with 15-20 PVCs per minute with concerns for PVC cardiomyopathy. Patient was started on amiodarone with the hope to improve LV function. Will consider CRT-D upgrade with no improvement in LV function. INR on discharge 1.73 close to goal, might need adjustment now that patient has been started on amiodarone --Continue Digoxin 0.125mg  daily --Continue Amiodarone 200 mg bid (NEW) --Continue Coumadin 10mg  daily, goal 2-3  #Chronic Venous Insufficiency, ongoing Patient was wearing bilateral unna boots, which were removed in clinic on admission day. Patient treated for cellulitis on multiple occasions in the past, with some hyperpigmentation and healing lesions bilaterally. On admission, patient and PCP felt that venous stasis ulcers were improving with the unna boots. Patient benefitted from aggressive diuresis in the setting --She will need unna boots replaced prior to discharge  #OSA, with Oxygen requirement  Echo on 10/17 showed worsening pulmonary hypertension. Oxygen concentrator available for her to pick up with [facility] while she is getting her current one fixed.  --Continue CPAP at night in the setting of worsening pulmonary hypertension --Continue 2L home oxygen   Issues for Follow Up:   1. Follow on BP, INR and creatinine 2. Assess PVCs and RV pacing, possible upgrade to CRT-D 3. Patient should swap current non functioning oxygen concentrator that she uses  with CPAP for a loaner provided by [facility] until concentrator is fixed. Patient was already informed by Case Manager.  4. Patient has been contacted with [facility] hospital liaison, eligible for [facility] care management, could assist with post hospital  follow up as needed.  Significant Procedures:  Echocardiogram Left Heart Catheterization Pacer evaluation  Significant Labs and Imaging:   Recent Labs Lab 10/05/16 0407 10/06/16 0232 10/07/16 0322  WBC 4.6 5.3 4.6  HGB 13.1 13.6 12.8  HCT 38.6 40.9 38.4  PLT 106* 124* 107*    Recent Labs Lab 10/03/16 0424 10/04/16 0813 10/05/16 0407 10/06/16 0232 10/06/16 1121 10/07/16 0322  NA 138 135 133*  --  132* 135  K 3.5 3.9 3.6  --  4.2 3.9  CL 98* 91* 92*  --  90* 92*  CO2 33* 36* 31  --  33* 33*  GLUCOSE 96 83 89  --  98 93  BUN 16 14 19   --  22* 28*  CREATININE 0.91 0.89 0.95  --  1.00 1.30*  CALCIUM 9.4 9.9 9.6  --  9.9 9.5  MG  --  1.8  --  2.3  --   --   ALKPHOS  --   --   --   --   --  87  AST  --   --   --   --   --  25  ALT  --   --   --   --   --  14  ALBUMIN  --   --   --   --   --  3.0*   X-ray Chest Pa And Lateral  Result Date: 10/01/2016 CLINICAL DATA:  Shortness of breath. EXAM: CHEST  2 VIEW COMPARISON:  09/15/2016. FINDINGS: Cardiac pacer in stable position. Cardiomegaly with pulmonary vascular prominence and bilateral interstitial prominence noted consistent with congestive heart failure. Low lung volumes with bibasilar atelectasis . Small left pleural effusion. No pneumothorax. IMPRESSION: 1. Cardiac pacer stable position. Cardiomegaly with bilateral pulmonary interstitial prominence and left pleural effusion consistent with congestive heart failure. 2. Low lung volumes with bibasilar atelectasis. Electronically Signed   By: Maisie Fus   Register   On: 10/01/2016 06:58     Results/Tests Pending at Time of Discharge: None  Discharge Medications:    Medication List    STOP taking these medications   furosemide 80 MG tablet Commonly known as:  LASIX   metoprolol succinate 25 MG 24 hr tablet Commonly known as:  TOPROL-XL     TAKE these medications   amiodarone 200 MG tablet Commonly known as:  PACERONE Take 1 tablet (200 mg total) by mouth 2 (two) times daily.   [employer] VITAMIN D3 1000 units capsule Generic drug:  Cholecalciferol TAKE ONE CAPSULE BY MOUTH EVERY MORNING What changed:  See the new instructions.   digoxin 0.125 MG tablet Commonly known as:  LANOXIN TAKE 1 TABLET (125 MCG TOTAL) BY MOUTH DAILY.   fluticasone 50 MCG/ACT nasal spray Commonly known as:  FLONASE Place 2 sprays into both nostrils daily as needed. For congestion.   losartan 25 MG tablet Commonly known as:  COZAAR Take 0.5 tablets (12.5 mg total) by mouth daily. Start taking on:  10/08/2016   nystatin powder Commonly known as:  MYCOSTATIN/NYSTOP Apply topically 4 (four) times daily.   omeprazole 20 MG capsule Commonly known as:  PRILOSEC TAKE ONE CAPSULE BY MOUTH EVERY DAY What changed:  See the new instructions.   OXYGEN Inhale 2 L  into the lungs at bedtime. cpap machine   PHENobarbital 32.4 MG tablet Commonly known as:  LUMINAL TAKE 2 TABLETS BY MOUTH TWICE A DAY What changed:  See the new instructions.   spironolactone 25 MG tablet Commonly known as:  ALDACTONE TAKE 1 TABLET (25 MG TOTAL) BY MOUTH DAILY.   torsemide 20 MG tablet Commonly known as:  DEMADEX Take 1 tablet (20 mg total) by mouth at bedtime. Take this at night   torsemide 20 MG tablet Commonly known as:  DEMADEX Take 2 tablets (40 mg total) by mouth every morning. Take this in the morning. Start taking on:  10/08/2016   warfarin 10 MG tablet Commonly known as:  COUMADIN Take 1 tablet (10 mg total) by mouth one time only at 6 PM. Start taking on:   10/08/2016 What changed:  See the new instructions.       Discharge Instructions: Please refer to Patient Instructions section of EMR for full details.  Patient was counseled important signs and symptoms that should prompt return to medical care, changes in medications, dietary instructions, activity restrictions, and follow up appointments.   Follow-Up Appointments: Follow-up Information    Arvilla MeresBensimhon, Daniel, MD Follow up on 10/18/2016.   Specialty:  Cardiology Why:  at 1000 am. Please bring all of your medications to your visit. Garage Code [id number]  Contact information: [address] [place] [address] [phone]        [facility] .   Specialty:  Home Health Services Why:  They will do your home health care at your home Contact information: [address] [place] [address] [phone]           Lovena NeighboursAbdoulaye Leightyn Cina, MD 10/07/2016, 11:03 PM PGY-1, [facility]

## 2016-10-04 NOTE — Progress Notes (Signed)
ANTICOAGULATION CONSULT NOTE Pharmacy Consult for Heparin  Indication: atrial fibrillation  Allergies  Allergen Reactions  . Aspirin Hives and Rash  . Penicillins Rash and Other (See Comments)    Amoxicillin also, Has patient had a PCN reaction causing immediate rash, facial/tongue/throat swelling, SOB or lightheadedness with hypotension: Yes Has patient had a PCN reaction causing severe rash involving mucus membranes or skin necrosis: No Has patient had a PCN reaction that required hospitalization No Has patient had a PCN reaction occurring within the last 10 years: Yes If all of the above answers are "NO", then may proceed with Cephalosporin use.   . Tape Other (See Comments)    NO PAPER TAPE-MUST BE MEDICAL TAPE  . Wool Alcohol [Lanolin] Hives  . Amoxicillin Rash  . Ampicillin Itching and Rash    Patient Measurements: Height: 5\' 4"  (162.6 cm) Weight: 216 lb 3.2 oz (98.1 kg) (scale a) IBW/kg (Calculated) : 54.7 Heparin Dosing Weight: 80kg  Vital Signs: Temp: 97.5 F (36.4 C) (10/20 1213) BP: 96/41 (10/20 1714) Pulse Rate: 69 (10/20 1714)  Labs:  Recent Labs  10/02/16 0603 10/03/16 0424 10/03/16 1135 10/04/16 0813 10/04/16 1628  HGB 12.4  --   --  13.6  --   HCT 37.4  --   --  40.9  --   PLT 89*  --   --  125*  --   LABPROT  --  22.1* 19.4* 19.2*  --   INR  --  1.90 1.62 1.60  --   HEPARINUNFRC  --   --   --  0.19* 0.34  CREATININE 0.70 0.91  --  0.89  --     Estimated Creatinine Clearance: 69.8 mL/min (by C-G formula based on SCr of 0.89 mg/dL).  Assessment: 65 y.o. female with Afib, on chronic coumadin, now s/p Cath 10/19 with normal coronary arteries. Pharmacy consulted to resume coumadin 10/19 with heparin bridge. INR 1.6 this AM. Heparin level (0.34) is therapeutic after rate increase this morning.   Goal of Therapy:  INR 2-3 Heparin level 0.3-0.7 units/ml Monitor platelets by anticoagulation protocol: Yes   Plan:  - Continue heparin 1400 units/h -  d/c when INR therapeutic - Daily heparin level/INR/CBC - Monitor for s/sx bleeding  Bayard Hugger, PharmD, BCPS  Clinical Pharmacist  Pager: [phone]   10/04/2016 5:27 PM

## 2016-10-04 NOTE — Care Management Note (Addendum)
Case Management Note  Patient Details  Name: Misty Gray MRN: [id number] Date of Birth: March 07, 1951  Subjective/Objective:        Admitted with CHF           Action/Plan: Patient lives at home with spouse, son and grandchildren. Has home 02 through [facility]; CM instructed pt to have her family member take the 02 concentrator to [facility] for them to change it out and to obtain a loaner until it is repaired. Patient could benefit from [facility] services, HHC choice offered, pt chose [facility]; [place] with [facility] called for arrangements but was unable to accept the patient's insurance. Patient is agreeable to [facility]; Lupita Leash with [facility] called for arrangements. Attending MD at discharge please enter [facility], PT and aide at discharge.  Expected Discharge Date:    possibly 10/07/2016              Expected Discharge Plan:  Home w Home Health Services  Discharge planning Services  CM Consult, [place]  Choice offered to:  Patient, Spouse  HH Arranged:  RN, Disease Management, PT, Nurse's Aide HH Agency: [facility] Status of Service:   In progress  Reola Mosher [phone] 10/04/2016, 1:30 PM

## 2016-10-04 NOTE — Progress Notes (Signed)
Patient ambulated the entire hall with a walker and standby assistance.  Oxygen saturation remained 90-94% on room air during ambulation.

## 2016-10-04 NOTE — Progress Notes (Signed)
Family Medicine Teaching Service Daily Progress Note Intern Pager: [phone]  [name] Medical record number: [id number] Date of birth: 1951-05-30 Age: 65 y.o. Gender: female  Primary Care Provider: Uvaldo RisingFLETKE, KYLE, J, MD Consultants: Heart failure team Code Status: Full   Pt Overview and Major Events to Date:  10/17: Echo  Assessment and Plan: Misty Gray is a 65 y.o. female presenting with shortness of breath. PMH is significant for HFrEF (EF 30-35%), A-fib (on Coumadin), HTN, chronic venous insufficiency.  # Acute exacerbation of HFrEF:  Patient started on metolazone 5mg  yesterday by heart failure team. Urine output has substantially increase to 8000ml in the past 24 hrs.   --Continue diuresis with Lasix 80 mg IV BID --Continue metolazone to 5 mg BID, increased to bid on 10/20 --Continue losartan 12.5 mg daily --Continue Spironolactone at 25 mg daily  --Continue Digoxin 0.125mg  daily --Continue to hold Metoprolol succinate  --Replete K as needed --Holding Enalapril  --Daily weights --Strict I/O  --O2 therapy prn to keep O2 sats > 92%  #Hypotension with history of Hypertension  Likely related to reduced EF. This morning BP is 104/73 despite brisk diuresis and limited fluid intake. --Continue to monitor BPs closely --Continue Digoxin 0.125mg  daily --Continue Spironolactone 25 mg daily --Holding Enalapril  --Hold Metoprolol succinate 12.5 mg daily   #Atrial Fibrillation, rate-controlled Patient agree to R/L cath. Well controlled, currently rate is 72. Patient underwent a left heart cath yesterday which showed normal coronaries  NICM with EF 30-35% and elevated biventricular pressures.   --Hold Metoprolol succinate --Continue Digoxin 0.125mg  daily --Resuming warfarin 10 mg daily  #Chronic Venous Insufficiency, ongoing Patient was wering bilateral unna boots, which were removed in clinic yesterday with  Has been treated many times for cellulitis, but I  do not think she has active cellulitis at this time. She feels that her venous stasis ulcers are improving with the unna boots. --She will need unna boots replaced prior to discharge  #OSA Echo on 10/17 showed worsening pulmonary hypertension. --Continue CPAP at night  --Oxygen and concentrator have been ordered --She will likely need a new CPAP machine ordered when she is discharged.  FEN/GI: Heart healthy diet,  Prophylaxis: On Coumadin for A-fib, protonix  Disposition: Pending response to IV Lasix. Anticipate discharge home in 3-4 days.  Subjective:  No acute events overnight. Patient did well overnight after undergoing cath yesterday, complaining of soreness in the groin area following femoral access during cath. Patient is tolerating po, with large urinary output.  Objective: Temp:  [97.2 F (36.2 C)-97.8 F (36.6 C)] 97.2 F (36.2 C) (10/20 0508) Pulse Rate:  [0-89] 72 (10/20 0508) Resp:  [0-35] 18 (10/20 0508) BP: (73-132)/(38-88) 122/69 (10/20 0508) SpO2:  [0 %-100 %] 100 % (10/20 0508) Weight:  [216 lb 3.2 oz (98.1 kg)-228 lb 9.6 oz (103.7 kg)] 216 lb 3.2 oz (98.1 kg) (10/20 14780508) Physical Exam:  General: well appeared this morning, in NAD, answering questions appropriately Eyes: EOMI, PERRLA, no scleral icterus ENTM: Oropharynx clear, mildly dry mucous membranes Neck: Supple, no masses, no JVD Cardiovascular: Irregularly irregular rhythm, no murmurs Respiratory: Clear to auscultation bilaterally, mild crackles discerned on exam, no wheezing Gastrointestinal: +BS, soft, non-tender, non-distended MSK: chronic hyperpigmentation of the lower extremities bilaterally, 1+ pitting edema to the knee R>L, small circular open lesion present on the right anterior shin with serous drainage Neuro: Alert and oriented x4, CN 2-12 grossly intact Psych: Normal behavior, normal affect  Laboratory:  Recent Labs Lab 09/30/16  1606 10/01/16 0336 10/02/16 0603  WBC 4.1 4.4 4.0   HGB 13.1 12.8 12.4  HCT 39.4 38.7 37.4  PLT 105* 100* 89*    Recent Labs Lab 10/01/16 0336 10/02/16 0603 10/03/16 0424  NA 138 135 138  K 3.4* 3.3* 3.5  CL 104 99* 98*  CO2 27 28 33*  BUN 15 14 16   CREATININE 0.77 0.70 0.91  CALCIUM 8.9 8.9 9.4  GLUCOSE 93 91 96    Imaging/Diagnostic Tests: No results found.  Lovena Neighbours, MD 10/04/2016, 7:03 AM PGY-1, [facility] FPTS Intern pager: [phone], text pages welcome

## 2016-10-04 NOTE — Progress Notes (Signed)
Patient refused the use of CPAP for tonight.

## 2016-10-04 NOTE — Progress Notes (Signed)
Advanced Heart Failure Rounding Note   Subjective:    Brisk diuresis noted on lasix 80 mg IV BID with metolazone 5 mg BID.  Down 12 lbs. Labs pending.   Feeling better today.  Legs remains very swollen.    Cath yesterday with elevated biventricular pressures and normal coronaries.    R/LHC 10/03/16 Ao = 94/58 (72) LV = 104/20 RA = 21 RV = 65/4/19 PA = 65/21 (38) PCW = 23 Fick cardiac output/index = 6.1/2.9 PVR = 2.5 WU FA sat = 97%  PA sat = 69%, 70%  Assessment: 1. Normal coronary arteries 2. NICM with EF 30-35% by echo 3. Persistently elevated biventricular pressures  Objective:   Weight Range:  Vital Signs:   Temp:  [97.2 F (36.2 C)-97.8 F (36.6 C)] 97.2 F (36.2 C) (10/20 0508) Pulse Rate:  [0-89] 72 (10/20 0508) Resp:  [0-35] 18 (10/20 0508) BP: (73-132)/(38-88) 122/69 (10/20 0508) SpO2:  [0 %-100 %] 100 % (10/20 0508) Weight:  [216 lb 3.2 oz (98.1 kg)-228 lb 9.6 oz (103.7 kg)] 216 lb 3.2 oz (98.1 kg) (10/20 0508) Last BM Date: 10/03/16  Weight change: Filed Weights   10/01/16 0422 10/03/16 1118 10/04/16 0508  Weight: 237 lb 14.4 oz (107.9 kg) 228 lb 9.6 oz (103.7 kg) 216 lb 3.2 oz (98.1 kg)    Intake/Output:   Intake/Output Summary (Last 24 hours) at 10/04/16 0834 Last data filed at 10/04/16 7782  Gross per 24 hour  Intake            819.4 ml  Output             7700 ml  Net          -6880.6 ml     Physical Exam: General:  Chronically ill-appearing NAD. Sitting on edge of bed.  HEENT: normal Neck: supple. JVP to jaw Carotids 2+ bilat; no bruits. No thyromegaly or nodule noted.  Cor: PMI nonpalpable Irregular rate & rhythm. 2/6 TR Lungs: Diminished basilar sounds.  Abdomen: obese + abdominal hernial. Soft, NT, ND, no HSM. +BS  Extremities: no cyanosis, clubbing, rash, 2+ edema into thighs. BLE erythema.  Neuro: alert & orientedx3, cranial nerves grossly intact. moves all 4 extremities w/o difficulty. Affect pleasant   Telemetry:  Reviewed, AF with intermittent v-pacing 60-70s and frequent PVCs  Labs: Basic Metabolic Panel:  Recent Labs Lab 09/30/16 1606 10/01/16 0336 10/02/16 0603 10/03/16 0424  NA 138 138 135 138  K 3.1* 3.4* 3.3* 3.5  CL 101 104 99* 98*  CO2 30 27 28  33*  GLUCOSE 141* 93 91 96  BUN 14 15 14 16   CREATININE 0.77 0.77 0.70 0.91  CALCIUM 9.2 8.9 8.9 9.4    Liver Function Tests: No results for input(s): AST, ALT, ALKPHOS, BILITOT, PROT, ALBUMIN in the last 168 hours. No results for input(s): LIPASE, AMYLASE in the last 168 hours. No results for input(s): AMMONIA in the last 168 hours.  CBC:  Recent Labs Lab 09/30/16 1606 10/01/16 0336 10/02/16 0603  WBC 4.1 4.4 4.0  HGB 13.1 12.8 12.4  HCT 39.4 38.7 37.4  MCV 91.8 90.8 91.0  PLT 105* 100* 89*    Cardiac Enzymes:  Recent Labs Lab 09/30/16 1606 09/30/16 2106 10/01/16 0336  TROPONINI 0.03* 0.03* 0.03*    BNP: BNP (last 3 results)  Recent Labs  09/15/16 1124 09/30/16 1606  BNP 1,140.6* 927.9*    ProBNP (last 3 results) No results for input(s): PROBNP in the last 8760 hours.  Other results:  Imaging: No results found.   Medications:     Scheduled Medications: . digoxin  0.125 mg Oral Daily  . furosemide  80 mg Intravenous BID  . losartan  12.5 mg Oral Daily  . metolazone  5 mg Oral BID  . pantoprazole  40 mg Oral Daily  . PHENobarbital  64.8 mg Oral BID  . potassium chloride SA  40 mEq Oral BID  . sodium chloride flush  3 mL Intravenous Q12H  . spironolactone  25 mg Oral Daily  . Warfarin - Pharmacist Dosing Inpatient   Does not apply q1800    Infusions: . heparin 1,200 Units/hr (10/04/16 0015)    PRN Medications: sodium chloride, acetaminophen **OR** acetaminophen, acetaminophen, fluticasone, ondansetron **OR** ondansetron (ZOFRAN) IV, ondansetron (ZOFRAN) IV, polyethylene glycol, sodium chloride flush   Assessment:   1. Acute on chronic systolic HF   --ECHO 10/01/16 EF 30-35% with  inferior AK 2. LE cellulits 3. Morbid obesity 4. HTN 5. HL 6. Chronic AF with tach brady s/p pacemaker 7. Hypokalemia   Plan/Discussion:    Brisk diuresis again noted but remains volume overloaded.   - Continue lasix 80 IV bid and metolazone to 5 mg BID. Supp K based on BMET once it returns.  - Continue low-dose losartan and spironolactone 25 mg daily.    Cath yesterday with normal coronaries but continued marked volume overload.   Will check PPM for %pacing and PVC burden as etiology of NICM.   Has refused ICD or CRT upgrade in past.  Resumed coumadin last night. No INR this am. Pending. Dosing per pharm.   Length of Stay: 4  Misty FreerMichael Andrew Tillery PA-C  10/04/2016, 8:34 AM  Advanced Heart Failure Team Pager [phone] (M-F; 7a - 4p)  Please contact [facility] for night-coverage after hours (4p -7a ) and weekends on [email or web address]   Patient seen and examined with Otilio SaberAndy Tillery, PA-C. We discussed all aspects of the encounter. I agree with the assessment and plan as stated above.   Cath results reviewed with her and her husband. Coronaries normal but still volume overloaded. Renal function stable. Will continue diuresis. Supp K+. Will place UNNA boots.   Pacemaker interrogated today and has 81% ventricular pacing. Apparently unable to quantify PVC burden but I am not sure why this is. Will f/u with [employer] team. I am concerned she may have PVC cardiomyopathy. If PVC burden > 10% will start amio to suppress. Will need to consider CRT-D upgrade but has refused in past. Aff low-dose carvedilol.   Resume coumadin   Avya Flavell,MD 10:58 AM

## 2016-10-04 NOTE — Progress Notes (Signed)
Patient doesn't want to wear CPAP at this time.

## 2016-10-04 NOTE — Progress Notes (Signed)
Transitions of Care Pharmacy Note  Plan:  Educated on medication changes including: furosemide, metolazone, losartan, KCl  Misty Gray is a very pleasant 65 y.o. female who presents with a chief complaint CHF exacerbation. In anticipation of discharge, pharmacy has reviewed this patient's prior to admission medication history, as well as current inpatient medications listed per the [facility].  Current medication indications, dosing, frequency, and notable side effects reviewed with patient. Patient verbalized understanding of current inpatient medication regimen and is aware that the After Visit Summary when presented, will represent the most accurate medication list at discharge.   Misty Gray expressed no concerns regarding her medications.  Assessment: Understanding of regimen: good Understanding of indications: good Potential of compliance: good Barriers to Obtaining Medications: No  Patient instructed to contact inpatient pharmacy team with further questions or concerns if needed.    Time spent preparing for discharge counseling: 20 minutes Time spent counseling patient: 30 minutes   Thank you for allowing pharmacy to be a part of this patient's care.  Allie Bossier, PharmD PGY1 Pharmacy Resident [phone] (Pager) 10/04/2016 6:10 PM

## 2016-10-05 LAB — CBC
HCT: 38.6 % (ref 36.0–46.0)
HEMOGLOBIN: 13.1 g/dL (ref 12.0–15.0)
MCH: 30.8 pg (ref 26.0–34.0)
MCHC: 33.9 g/dL (ref 30.0–36.0)
MCV: 90.6 fL (ref 78.0–100.0)
Platelets: 106 10*3/uL — ABNORMAL LOW (ref 150–400)
RBC: 4.26 MIL/uL (ref 3.87–5.11)
RDW: 15.1 % (ref 11.5–15.5)
WBC: 4.6 10*3/uL (ref 4.0–10.5)

## 2016-10-05 LAB — PROTIME-INR
INR: 1.78
PROTHROMBIN TIME: 20.9 s — AB (ref 11.4–15.2)

## 2016-10-05 LAB — BASIC METABOLIC PANEL
ANION GAP: 10 (ref 5–15)
BUN: 19 mg/dL (ref 6–20)
CALCIUM: 9.6 mg/dL (ref 8.9–10.3)
CO2: 31 mmol/L (ref 22–32)
Chloride: 92 mmol/L — ABNORMAL LOW (ref 101–111)
Creatinine, Ser: 0.95 mg/dL (ref 0.44–1.00)
Glucose, Bld: 89 mg/dL (ref 65–99)
Potassium: 3.6 mmol/L (ref 3.5–5.1)
SODIUM: 133 mmol/L — AB (ref 135–145)

## 2016-10-05 LAB — HEPARIN LEVEL (UNFRACTIONATED): HEPARIN UNFRACTIONATED: 0.55 [IU]/mL (ref 0.30–0.70)

## 2016-10-05 MED ORDER — MAGNESIUM SULFATE 2 GM/50ML IV SOLN
2.0000 g | Freq: Once | INTRAVENOUS | Status: AC
  Administered 2016-10-05: 2 g via INTRAVENOUS
  Filled 2016-10-05: qty 50

## 2016-10-05 MED ORDER — TORSEMIDE 20 MG PO TABS
40.0000 mg | ORAL_TABLET | Freq: Two times a day (BID) | ORAL | Status: DC
  Administered 2016-10-05 – 2016-10-07 (×4): 40 mg via ORAL
  Filled 2016-10-05 (×4): qty 2

## 2016-10-05 MED ORDER — DIGOXIN 125 MCG PO TABS
0.0625 mg | ORAL_TABLET | Freq: Every day | ORAL | Status: DC
  Administered 2016-10-06 – 2016-10-07 (×2): 0.0625 mg via ORAL
  Filled 2016-10-05 (×2): qty 1

## 2016-10-05 MED ORDER — WARFARIN SODIUM 7.5 MG PO TABS
7.5000 mg | ORAL_TABLET | Freq: Once | ORAL | Status: AC
  Administered 2016-10-05: 7.5 mg via ORAL
  Filled 2016-10-05: qty 1

## 2016-10-05 MED ORDER — AMIODARONE HCL 200 MG PO TABS
200.0000 mg | ORAL_TABLET | Freq: Two times a day (BID) | ORAL | Status: DC
  Administered 2016-10-05 – 2016-10-07 (×5): 200 mg via ORAL
  Filled 2016-10-05 (×5): qty 1

## 2016-10-05 NOTE — Progress Notes (Signed)
Orthopedic Tech Progress Note Patient Details:  Misty Gray 10-18-1951 [id number]  Ortho Devices Type of Ortho Device: Radio broadcast assistant, Ace wrap Ortho Device/Splint Location: Bilateral unna boots Ortho Device/Splint Interventions: Application   Saul Fordyce 10/05/2016, 4:41 PM

## 2016-10-05 NOTE — Progress Notes (Signed)
Family Medicine Teaching Service Daily Progress Note Intern Pager: [phone]  [name] Medical record number: [id number] Date of birth: 1951/01/23 Age: 65 y.o. Gender: female  Primary Care Provider: Uvaldo Rising, MD Consultants: Heart failure team Code Status: Full   Pt Overview and Major Events to Date:  10/16: Admitted with HFrEF exacerbation 10/19: Normal left heart cath  Assessment and Plan: Misty Gray is a 65 y.o. female presenting with shortness of breath. PMH is significant for HFrEF (EF 30-35%), A-fib (on Coumadin), HTN, chronic venous insufficiency.  # Acute exacerbation of HFrEF: Echo 09/2016 with EF 30-35%. S/p left heart cath 10/19 which was normal. Being aggressively diuresed by heart failure team.  - Heart failure team consulted, appreciate assistance.  - Continue lasix 80mg  IV bid, metolazone 5mg  bid, spironolactone 25mg  daily - Continue losartan 12.5mg  daily - Continue digoxin 0.125mg  daily - Hold beta blocker - Strict weights, daily I/Os  #Hypotension with history of Hypertension. Secondary to HFrEF and volume overload. Improving.  --Continue to monitor BPs closely --Diuretics and antihypertensives as above --Consider stopping losartan if persistently hypotensive.   #Atrial Fibrillation, rate-controlled. Chadsvasc 4.  --Hold beta blocker in setting of acute volume overload --Continue Digoxin 0.125mg  daily --Resume warfarin per pharmacy, will need heparin bridge  #Chronic Venous Insufficiency, ongoing -- Will place unna boots.   #OSA. Echo on 10/17 showed worsening pulmonary hypertension. --Continue CPAP at night  --Oxygen and concentrator have been ordered --She will likely need a new CPAP machine ordered when she is discharged.  FEN/GI: Heart healthy diet SLIV.  Prophylaxis: On Coumadin for A-fib, protonix  Disposition: Admitted to inpatient pending above management.  Anticipate discharge home once adequately diuresed.    Subjective:  Doing well this morning. Still with swelling in her legs. No shortness of breath. Down 5L and 10 pounds. over the past 24 hours. Down 17L and 32 pounds since admission.   Objective: Temp:  [97.5 F (36.4 C)-97.8 F (36.6 C)] 97.7 F (36.5 C) (10/21 0532) Pulse Rate:  [69-76] 76 (10/21 0532) Resp:  [18] 18 (10/21 0532) BP: (80-126)/(36-77) 126/77 (10/21 0532) SpO2:  [94 %-99 %] 94 % (10/21 0532) Weight:  [206 lb 11.2 oz (93.8 kg)] 206 lb 11.2 oz (93.8 kg) (10/21 0532) Physical Exam:  General: NAD. Well appearing. Speaking in full sentences.  Cardiovascular: Irregularly irregular rhythm, no murmurs Respiratory: normal work of breathing. Clear to auscultation Gastrointestinal: +BS, soft, non-tender, non-distended MSK: chronic hyperpigmentation of the lower extremities bilaterally, 2+ pitting edema to the knee R>L, small circular open lesion present on the right anterior shin with serous drainage Neuro: Alert and oriented x4, CN 2-12 grossly intact Psych: Normal behavior, normal affect  Laboratory:  Recent Labs Lab 10/02/16 0603 10/04/16 0813 10/05/16 0407  WBC 4.0 5.3 4.6  HGB 12.4 13.6 13.1  HCT 37.4 40.9 38.6  PLT 89* 125* 106*    Recent Labs Lab 10/03/16 0424 10/04/16 0813 10/05/16 0407  NA 138 135 133*  K 3.5 3.9 3.6  CL 98* 91* 92*  CO2 33* 36* 31  BUN 16 14 19   CREATININE 0.91 0.89 0.95  CALCIUM 9.4 9.9 9.6  GLUCOSE 96 83 89    Imaging/Diagnostic Tests: None new.   Ardith Dark, MD 10/05/2016, 7:19 AM PGY-3, [facility] FPTS Intern pager: [phone], text pages welcome

## 2016-10-05 NOTE — Progress Notes (Signed)
Heparin GTT restarted

## 2016-10-05 NOTE — Progress Notes (Signed)
Advanced Heart Failure Rounding Note   Subjective:    Brisk diuresis noted on lasix 80 mg IV BID with metolazone 5 mg BID.  Down another 10lbs. (32 pounds total) Feeling better today.  Denies dyspnea. Creatinine stable.   Tele and pacer interrogation reviewed. She has 81% RV pacing and 15-20 PVCs per minute  SBP in 80s  R/LHC 10/03/16 Ao = 94/58 (72) LV = 104/20 RA = 21 RV = 65/4/19 PA = 65/21 (38) PCW = 23 Fick cardiac output/index = 6.1/2.9 PVR = 2.5 WU FA sat = 97%  PA sat = 69%, 70%  Assessment: 1. Normal coronary arteries 2. NICM with EF 30-35% by echo 3. Persistently elevated biventricular pressures  Objective:   Weight Range:  Vital Signs:   Temp:  [97.7 F (36.5 C)-97.8 F (36.6 C)] 97.7 F (36.5 C) (10/21 1142) Pulse Rate:  [69-76] 69 (10/21 1142) Resp:  [18] 18 (10/21 1142) BP: (85-126)/(41-77) 86/45 (10/21 1142) SpO2:  [94 %-95 %] 95 % (10/21 1142) Weight:  [93.8 kg (206 lb 11.2 oz)] 93.8 kg (206 lb 11.2 oz) (10/21 0532) Last BM Date: 10/03/16  Weight change: Filed Weights   10/03/16 1118 10/04/16 0508 10/05/16 0532  Weight: 103.7 kg (228 lb 9.6 oz) 98.1 kg (216 lb 3.2 oz) 93.8 kg (206 lb 11.2 oz)    Intake/Output:   Intake/Output Summary (Last 24 hours) at 10/05/16 1356 Last data filed at 10/05/16 1300  Gross per 24 hour  Intake           1476.6 ml  Output             5875 ml  Net          -4398.4 ml     Physical Exam: General:  Sitting in chair. Multiple family members present.  HEENT: normal Neck: supple. JVP 8 Carotids 2+ bilat; no bruits. No thyromegaly or nodule noted.  Cor: PMI nonpalpable Irregular rate & rhythm. 2/6 TR Lungs: Diminished basilar sounds.  Abdomen: obese + abdominal hernial. Soft, NT, ND, no HSM. +BS  Extremities: no cyanosis, clubbing, rash, 1+ edema BLE erythema.  Neuro: alert & orientedx3, cranial nerves grossly intact. moves all 4 extremities w/o difficulty. Affect pleasant   Telemetry: Reviewed, AF  with frequent v-pacing 60-70s and frequent PVCs  Labs: Basic Metabolic Panel:  Recent Labs Lab 10/01/16 0336 10/02/16 0603 10/03/16 0424 10/04/16 0813 10/05/16 0407  NA 138 135 138 135 133*  K 3.4* 3.3* 3.5 3.9 3.6  CL 104 99* 98* 91* 92*  CO2 27 28 33* 36* 31  GLUCOSE 93 91 96 83 89  BUN 15 14 16 14 19   CREATININE 0.77 0.70 0.91 0.89 0.95  CALCIUM 8.9 8.9 9.4 9.9 9.6  MG  --   --   --  1.8  --     Liver Function Tests: No results for input(s): AST, ALT, ALKPHOS, BILITOT, PROT, ALBUMIN in the last 168 hours. No results for input(s): LIPASE, AMYLASE in the last 168 hours. No results for input(s): AMMONIA in the last 168 hours.  CBC:  Recent Labs Lab 09/30/16 1606 10/01/16 0336 10/02/16 0603 10/04/16 0813 10/05/16 0407  WBC 4.1 4.4 4.0 5.3 4.6  HGB 13.1 12.8 12.4 13.6 13.1  HCT 39.4 38.7 37.4 40.9 38.6  MCV 91.8 90.8 91.0 91.5 90.6  PLT 105* 100* 89* 125* 106*    Cardiac Enzymes:  Recent Labs Lab 09/30/16 1606 09/30/16 2106 10/01/16 0336  TROPONINI 0.03* 0.03* 0.03*  BNP: BNP (last 3 results)  Recent Labs  09/15/16 1124 09/30/16 1606  BNP 1,140.6* 927.9*    ProBNP (last 3 results) No results for input(s): PROBNP in the last 8760 hours.    Other results:  Imaging: No results found.   Medications:     Scheduled Medications: . digoxin  0.125 mg Oral Daily  . furosemide  80 mg Intravenous BID  . losartan  12.5 mg Oral Daily  . metolazone  5 mg Oral BID  . pantoprazole  40 mg Oral Daily  . PHENobarbital  64.8 mg Oral BID  . potassium chloride SA  40 mEq Oral BID  . sodium chloride flush  3 mL Intravenous Q12H  . spironolactone  25 mg Oral Daily  . warfarin  7.5 mg Oral ONCE-1800  . Warfarin - Pharmacist Dosing Inpatient   Does not apply q1800    Infusions: . heparin 1,400 Units/hr (10/04/16 1750)    PRN Medications: sodium chloride, acetaminophen **OR** acetaminophen, acetaminophen, fluticasone, ondansetron **OR** ondansetron  (ZOFRAN) IV, ondansetron (ZOFRAN) IV, polyethylene glycol, sodium chloride flush   Assessment:   1. Acute on chronic systolic HF   --ECHO 10/01/16 EF 30-35% with inferior AK 2. LE cellulits 3. Morbid obesity 4. HTN 5. HL 6. Chronic AF with tach brady s/p pacemaker 7. Hypokalemia   Plan/Discussion:    Volume status improving rapidly. BP low. Will switch to po torsemide 40 bid. BP too low to titrate HF meds at this time.   Pacemaker interrogated and tele reviewed in detail. Has 81% ventricular pacing. 15-20 PVCs per minute (~30%). I am concerned she may have PVC cardiomyopathy. Will start amio to suppress and see if LV function improves. If EF does not improve, will need to consider CRT-D upgrade but has refused in past.   On heparin/coumadin. INR 1.78  Will need paramedicine at d/c. Hopefully can get her home by Monday   Length of Stay: 5  [name], Daniel MD  10/05/2016, 1:56 PM  Advanced Heart Failure Team Pager [phone] (M-F; 7a - 4p)  Please contact [facility] for night-coverage after hours (4p -7a ) and weekends on [email or web address]

## 2016-10-05 NOTE — Progress Notes (Signed)
ANTICOAGULATION CONSULT NOTE Pharmacy Consult for Heparin > warfarin Indication: atrial fibrillation  Allergies  Allergen Reactions  . Aspirin Hives and Rash  . Penicillins Rash and Other (See Comments)    Amoxicillin also, Has patient had a PCN reaction causing immediate rash, facial/tongue/throat swelling, SOB or lightheadedness with hypotension: Yes Has patient had a PCN reaction causing severe rash involving mucus membranes or skin necrosis: No Has patient had a PCN reaction that required hospitalization No Has patient had a PCN reaction occurring within the last 10 years: Yes If all of the above answers are "NO", then may proceed with Cephalosporin use.   . Tape Other (See Comments)    NO PAPER TAPE-MUST BE MEDICAL TAPE  . Wool Alcohol [Lanolin] Hives  . Amoxicillin Rash  . Ampicillin Itching and Rash    Patient Measurements: Height: 5\' 4"  (162.6 cm) Weight: 206 lb 11.2 oz (93.8 kg) (scale a) IBW/kg (Calculated) : 54.7 Heparin Dosing Weight: 80kg  Vital Signs: Temp: 97.7 F (36.5 C) (10/21 1142) Temp Source: Oral (10/21 1142) BP: 86/45 (10/21 1142) Pulse Rate: 69 (10/21 1142)  Labs:  Recent Labs  10/03/16 0424 10/03/16 1135 10/04/16 0813 10/04/16 1628 10/05/16 0407  HGB  --   --  13.6  --  13.1  HCT  --   --  40.9  --  38.6  PLT  --   --  125*  --  106*  LABPROT 22.1* 19.4* 19.2*  --  20.9*  INR 1.90 1.62 1.60  --  1.78  HEPARINUNFRC  --   --  0.19* 0.34 0.55  CREATININE 0.91  --  0.89  --  0.95    Estimated Creatinine Clearance: 63.8 mL/min (by C-G formula based on SCr of 0.95 mg/dL).  Assessment: 66 y.o. female with Afib, on chronic coumadin with admission INr 3.2 s/p Cath 10/19 with normal coronary arteries. Pharmacy consulted to resume coumadin 10/19 with heparin bridge until INR >2. INR 1.6>1.8 after 2 doses warfarin 10mg  will dec dose tonight to prevent big jump. Heaprin drip 1400 uts/hr, heparin level 0.34>0.55 with rate increase yesterday - though  at goal will prevent accumulation and up trend.  CBC ok no bleeding  Goal of Therapy:  INR 2-3 Heparin level 0.3-0.7 units/ml Monitor platelets by anticoagulation protocol: Yes   Plan:  - Decrease heparin 1350 units/h - d/c when INR therapeutic - Daily heparin level/INR/CBC - Monitor for s/sx bleeding Warfarin 7.5mg  x1  Leota Sauers Pharm.D. CPP, BCPS Clinical Pharmacist [phone] 10/05/2016 1:59 PM

## 2016-10-05 NOTE — Progress Notes (Signed)
Patient c/o pain at IV site.  Heparin stopped.  IV team notified.  Will continue to monitor. A.Noheli Melder, RN

## 2016-10-05 NOTE — Progress Notes (Signed)
Pt. placed on CPAP of (5) for h/s per home regimen with m/l nasal mask, no humidity, 2 lpm oxygen added to circuit, husband at bedside, tolerating well, RT to monitor.

## 2016-10-06 DIAGNOSIS — L03115 Cellulitis of right lower limb: Secondary | ICD-10-CM

## 2016-10-06 LAB — CBC
HCT: 40.9 % (ref 36.0–46.0)
Hemoglobin: 13.6 g/dL (ref 12.0–15.0)
MCH: 30 pg (ref 26.0–34.0)
MCHC: 33.3 g/dL (ref 30.0–36.0)
MCV: 90.3 fL (ref 78.0–100.0)
PLATELETS: 124 10*3/uL — AB (ref 150–400)
RBC: 4.53 MIL/uL (ref 3.87–5.11)
RDW: 14.9 % (ref 11.5–15.5)
WBC: 5.3 10*3/uL (ref 4.0–10.5)

## 2016-10-06 LAB — BASIC METABOLIC PANEL
ANION GAP: 9 (ref 5–15)
BUN: 22 mg/dL — ABNORMAL HIGH (ref 6–20)
CHLORIDE: 90 mmol/L — AB (ref 101–111)
CO2: 33 mmol/L — AB (ref 22–32)
Calcium: 9.9 mg/dL (ref 8.9–10.3)
Creatinine, Ser: 1 mg/dL (ref 0.44–1.00)
GFR calc Af Amer: >60 mL/min (ref >60)
GFR calc non Af Amer: 57 mL/min — ABNORMAL LOW (ref >60)
GLUCOSE: 98 mg/dL (ref 65–99)
POTASSIUM: 4.2 mmol/L (ref 3.5–5.1)
Sodium: 132 mmol/L — ABNORMAL LOW (ref 135–145)

## 2016-10-06 LAB — HEPARIN LEVEL (UNFRACTIONATED): Heparin Unfractionated: 0.41 IU/mL (ref 0.30–0.70)

## 2016-10-06 LAB — MAGNESIUM: Magnesium: 2.3 mg/dL (ref 1.7–2.4)

## 2016-10-06 LAB — PROTIME-INR
INR: 1.73
Prothrombin Time: 20.5 seconds — ABNORMAL HIGH (ref 11.4–15.2)

## 2016-10-06 MED ORDER — WARFARIN SODIUM 10 MG PO TABS
10.0000 mg | ORAL_TABLET | Freq: Once | ORAL | Status: AC
  Administered 2016-10-06: 10 mg via ORAL
  Filled 2016-10-06: qty 1

## 2016-10-06 MED ORDER — LOSARTAN POTASSIUM 25 MG PO TABS: 25.0000 mg | ORAL_TABLET | Freq: Every day | ORAL | Status: DC

## 2016-10-06 MED ORDER — POTASSIUM CHLORIDE CRYS ER 20 MEQ PO TBCR
20.0000 meq | EXTENDED_RELEASE_TABLET | Freq: Two times a day (BID) | ORAL | Status: DC
  Administered 2016-10-06 – 2016-10-07 (×2): 20 meq via ORAL
  Filled 2016-10-06 (×2): qty 1

## 2016-10-06 NOTE — Progress Notes (Signed)
ANTICOAGULATION CONSULT NOTE Pharmacy Consult for Heparin > warfarin Indication: atrial fibrillation  Allergies  Allergen Reactions  . Aspirin Hives and Rash  . Penicillins Rash and Other (See Comments)    Amoxicillin also, Has patient had a PCN reaction causing immediate rash, facial/tongue/throat swelling, SOB or lightheadedness with hypotension: Yes Has patient had a PCN reaction causing severe rash involving mucus membranes or skin necrosis: No Has patient had a PCN reaction that required hospitalization No Has patient had a PCN reaction occurring within the last 10 years: Yes If all of the above answers are "NO", then may proceed with Cephalosporin use.   . Tape Other (See Comments)    NO PAPER TAPE-MUST BE MEDICAL TAPE  . Wool Alcohol [Lanolin] Hives  . Amoxicillin Rash  . Ampicillin Itching and Rash    Patient Measurements: Height: 5\' 4"  (162.6 cm) Weight: 202 lb 1.6 oz (91.7 kg) IBW/kg (Calculated) : 54.7 Heparin Dosing Weight: 80kg  Vital Signs: Temp: 98.4 F (36.9 C) (10/22 1155) Temp Source: Oral (10/22 1155) BP: 90/59 (10/22 1155) Pulse Rate: 70 (10/22 1155)  Labs:  Recent Labs  10/04/16 0813 10/04/16 1628 10/05/16 0407 10/06/16 0232 10/06/16 1121  HGB 13.6  --  13.1 13.6  --   HCT 40.9  --  38.6 40.9  --   PLT 125*  --  106* 124*  --   LABPROT 19.2*  --  20.9* 20.5*  --   INR 1.60  --  1.78 1.73  --   HEPARINUNFRC 0.19* 0.34 0.55 0.41  --   CREATININE 0.89  --  0.95  --  1.00    Estimated Creatinine Clearance: 59.9 mL/min (by C-G formula based on SCr of 1 mg/dL).  Assessment: 65 y.o. female with Afib, on chronic coumadin with admission INr 3.2 s/p Cath 10/19 with normal coronary arteries. Pharmacy consulted to resume coumadin 10/19 with heparin bridge until INR >2. INR 1.6>1.8 after 2 doses warfarin 10mg  will dec dose tonight to prevent big jump and INR stayed 1.7 - will restart 10mg  toight.   Will need to adjust dosing at DC with new Amiodarone  10/21 - expect to see less warfarin requirements over next 2-3 weeks Heparin drip 1350 uts/hr, heparin level 0.4 CBC ok no bleeding INR close to goall no Hx CVA - will stop heparin  Goal of Therapy:  INR 2-3 Heparin level 0.3-0.7 units/ml Monitor platelets by anticoagulation protocol: Yes   Plan:  Stop heaprin - INR close to goal - Daily INR/CBC - Monitor for s/sx bleeding Warfarin 10mg  x1  Leota Sauers Pharm.D. CPP, BCPS Clinical Pharmacist [phone] 10/06/2016 12:14 PM

## 2016-10-06 NOTE — Progress Notes (Signed)
Advanced Heart Failure Rounding Note   Subjective:    Started on amio for frequent PVCs yesterday. PVCs less prominent.   Feels good. Denies orthopnea.  Now on po torsemide. Weight down another 4 pounds.  No BMET today yet. INR 1.73. Pharmacy adjusting coumadin.   Tele and pacer interrogation reviewed. She has 81% RV pacing and 15-20 PVCs per minute   R/LHC 10/03/16 Ao = 94/58 (72) LV = 104/20 RA = 21 RV = 65/4/19 PA = 65/21 (38) PCW = 23 Fick cardiac output/index = 6.1/2.9 PVR = 2.5 WU FA sat = 97%  PA sat = 69%, 70%  Assessment: 1. Normal coronary arteries 2. NICM with EF 30-35% by echo 3. Persistently elevated biventricular pressures  Objective:   Weight Range:  Vital Signs:   Temp:  [97.7 F (36.5 C)-98.1 F (36.7 C)] 97.8 F (36.6 C) (10/22 0500) Pulse Rate:  [69-74] 74 (10/22 0500) Resp:  [18] 18 (10/22 0500) BP: (86-95)/(45-56) 95/56 (10/22 0500) SpO2:  [95 %-96 %] 96 % (10/22 0500) Weight:  [91.7 kg (202 lb 1.6 oz)] 91.7 kg (202 lb 1.6 oz) (10/22 0500) Last BM Date: 10/05/16  Weight change: Filed Weights   10/04/16 0508 10/05/16 0532 10/06/16 0500  Weight: 98.1 kg (216 lb 3.2 oz) 93.8 kg (206 lb 11.2 oz) 91.7 kg (202 lb 1.6 oz)    Intake/Output:   Intake/Output Summary (Last 24 hours) at 10/06/16 1027 Last data filed at 10/06/16 0900  Gross per 24 hour  Intake           897.62 ml  Output             1904 ml  Net         -1006.38 ml     Physical Exam: General:  Sitting in chair.  HEENT: normal Neck: supple. JVP 8 Carotids 2+ bilat; no bruits. No thyromegaly or nodule noted.  Cor: PMI nonpalpable Irregular rate & rhythm. 2/6 TR Lungs: Diminished basilar sounds.  Abdomen: obese + abdominal hernial. Soft, NT, ND, no HSM. +BS  Extremities: no cyanosis, clubbing, rash, 1+ edema BLE erythema. UNNA boots present  Neuro: alert & orientedx3, cranial nerves grossly intact. moves all 4 extremities w/o difficulty. Affect pleasant   Telemetry:  Reviewed, AF with frequent v-pacing 60-70s and PVCs  Labs: Basic Metabolic Panel:  Recent Labs Lab 10/01/16 0336 10/02/16 0603 10/03/16 0424 10/04/16 0813 10/05/16 0407 10/06/16 0232  NA 138 135 138 135 133*  --   K 3.4* 3.3* 3.5 3.9 3.6  --   CL 104 99* 98* 91* 92*  --   CO2 27 28 33* 36* 31  --   GLUCOSE 93 91 96 83 89  --   BUN 15 14 16 14 19   --   CREATININE 0.77 0.70 0.91 0.89 0.95  --   CALCIUM 8.9 8.9 9.4 9.9 9.6  --   MG  --   --   --  1.8  --  2.3    Liver Function Tests: No results for input(s): AST, ALT, ALKPHOS, BILITOT, PROT, ALBUMIN in the last 168 hours. No results for input(s): LIPASE, AMYLASE in the last 168 hours. No results for input(s): AMMONIA in the last 168 hours.  CBC:  Recent Labs Lab 10/01/16 0336 10/02/16 0603 10/04/16 0813 10/05/16 0407 10/06/16 0232  WBC 4.4 4.0 5.3 4.6 5.3  HGB 12.8 12.4 13.6 13.1 13.6  HCT 38.7 37.4 40.9 38.6 40.9  MCV 90.8 91.0 91.5 90.6 90.3  PLT 100* 89* 125* 106* 124*    Cardiac Enzymes:  Recent Labs Lab 09/30/16 1606 09/30/16 2106 10/01/16 0336  TROPONINI 0.03* 0.03* 0.03*    BNP: BNP (last 3 results)  Recent Labs  09/15/16 1124 09/30/16 1606  BNP 1,140.6* 927.9*    ProBNP (last 3 results) No results for input(s): PROBNP in the last 8760 hours.    Other results:  Imaging: No results found.   Medications:     Scheduled Medications: . amiodarone  200 mg Oral BID  . digoxin  0.0625 mg Oral Daily  . losartan  12.5 mg Oral Daily  . pantoprazole  40 mg Oral Daily  . PHENobarbital  64.8 mg Oral BID  . potassium chloride SA  40 mEq Oral BID  . sodium chloride flush  3 mL Intravenous Q12H  . spironolactone  25 mg Oral Daily  . torsemide  40 mg Oral BID  . Warfarin - Pharmacist Dosing Inpatient   Does not apply q1800    Infusions: . heparin 1,350 Units/hr (10/05/16 1347)    PRN Medications: sodium chloride, acetaminophen **OR** acetaminophen, acetaminophen, fluticasone,  ondansetron **OR** ondansetron (ZOFRAN) IV, ondansetron (ZOFRAN) IV, polyethylene glycol, sodium chloride flush   Assessment:   1. Acute on chronic systolic HF   --ECHO 10/01/16 EF 30-35% with inferior AK 2. LE cellulits 3. Morbid obesity 4. HTN 5. HL 6. Chronic AF with tach brady s/p pacemaker 7. Hypokalemia   Plan/Discussion:    Volume status continues to improve on po torsemide. Will get BMET today. Will increase losartan to 25. BP to low to add b-blocker.   Pacemaker interrogated and tele reviewed in detail. Has 81% ventricular pacing. 15-20 PVCs per minute (~30%). I am concerned she may have PVC cardiomyopathy. Amio started 10/21 to suppress and see if LV function improves. If EF does not improve, will need to consider CRT-D upgrade but has refused in past.   On heparin/coumadin. INR 1.78. Given that this is for AF can stop heparin and continue to load coumadin without bridge.   Will need paramedicine at d/c. Hopefully can get her home tomorrow.   Would ambulate.   Length of Stay: 6  Johnnathan Hagemeister MD  10/06/2016, 10:27 AM  Advanced Heart Failure Team Pager [phone] (M-F; 7a - 4p)  Please contact [facility] for night-coverage after hours (4p -7a ) and weekends on [email or web address]

## 2016-10-06 NOTE — Progress Notes (Signed)
Family Medicine Teaching Service Daily Progress Note Intern Pager: [phone]  [name] Medical record number: [id number] Date of birth: 01/21/1951 Age: 65 y.o. Gender: female  Primary Care Provider: Uvaldo Rising, MD Consultants: Heart failure team Code Status: Full   Pt Overview and Major Events to Date:  10/16: Admitted with HFrEF exacerbation 10/19: Normal left heart cath  Assessment and Plan: Misty Gray is a 65 y.o. female presenting with shortness of breath. PMH is significant for HFrEF (EF 30-35%), A-fib (on Coumadin), HTN, chronic venous insufficiency.  # Acute exacerbation of HFrEF: Echo 09/2016 with EF 30-35%. S/p left heart cath 10/19 which was normal. Being aggressively diuresed by heart failure team. Down 1.7L and 4 pounds over the past 24 hours. Down 18.8L and 36 pounds since admission. BP currently too soft to titrate HF meds - Heart failure team consulted, appreciate assistance.  - Continue torsemide 40mg  daily, spironolactone 25mg  daily - Currently holding metolazone 5mg  bid as BP soft - Continue losartan 12.5mg  daily - Continue digoxin 0.125mg  daily - Hold beta blocker - Strict weights, daily I/Os  #Hypotension with history of Hypertension. Secondary to HFrEF and volume overload.  --Continue to monitor BPs closely --Diuretics and antihypertensives as above --Consider stopping losartan if persistently hypotensive.   #Atrial Fibrillation, rate-controlled. Chadsvasc 4.  --Hold beta blocker in setting of acute volume overload --Continue Digoxin 0.125mg  daily --Continue warfarin per pharmacy, will need heparin bridge  #Chronic Venous Insufficiency, ongoing -- unna boots placed 10/21  #OSA. Echo on 10/17 showed worsening pulmonary hypertension. --Continue CPAP at night  --Oxygen and concentrator have been ordered --She will likely need a new CPAP machine ordered when she is discharged.  FEN/GI: Heart healthy diet SLIV.  Prophylaxis: On  Coumadin for A-fib, protonix  Disposition: Admitted to inpatient pending above management.  Anticipate discharge home once adequately diuresed.   Subjective:  Doing well this morning. Still with swelling in her legs. No shortness of breath. Was happy to get more fluid off prior to d/c  Objective: Temp:  [97.7 F (36.5 C)-98.1 F (36.7 C)] 97.8 F (36.6 C) (10/22 0500) Pulse Rate:  [69-74] 74 (10/22 0500) Resp:  [18] 18 (10/22 0500) BP: (86-95)/(45-56) 95/56 (10/22 0500) SpO2:  [95 %-96 %] 96 % (10/22 0500) Weight:  [202 lb 1.6 oz (91.7 kg)] 202 lb 1.6 oz (91.7 kg) (10/22 0500) Physical Exam:  General: NAD. Well appearing. Speaking in full sentences. Cardiovascular: Irregularly irregular rhythm, no murmurs Respiratory: normal work of breathing. Clear to auscultation Gastrointestinal: +BS, soft, non-tender, non-distended MSK: unna boots in place Neuro: Alert and oriented x4, CN 2-12 grossly intact Psych: Normal behavior, normal affect  Laboratory:  Recent Labs Lab 10/04/16 0813 10/05/16 0407 10/06/16 0232  WBC 5.3 4.6 5.3  HGB 13.6 13.1 13.6  HCT 40.9 38.6 40.9  PLT 125* 106* 124*    Recent Labs Lab 10/03/16 0424 10/04/16 0813 10/05/16 0407  NA 138 135 133*  K 3.5 3.9 3.6  CL 98* 91* 92*  CO2 33* 36* 31  BUN 16 14 19   CREATININE 0.91 0.89 0.95  CALCIUM 9.4 9.9 9.6  GLUCOSE 96 83 89    Imaging/Diagnostic Tests: None new.   Erasmo Downer, MD 10/06/2016, 9:55 AM PGY-3, [facility] FPTS Intern pager: [phone], text pages welcome

## 2016-10-07 ENCOUNTER — Ambulatory Visit: Payer: Medicare Other

## 2016-10-07 DIAGNOSIS — N179 Acute kidney failure, unspecified: Secondary | ICD-10-CM

## 2016-10-07 LAB — HEPATIC FUNCTION PANEL
ALBUMIN: 3 g/dL — AB (ref 3.5–5.0)
ALT: 14 U/L (ref 14–54)
AST: 25 U/L (ref 15–41)
Alkaline Phosphatase: 87 U/L (ref 38–126)
BILIRUBIN TOTAL: 1 mg/dL (ref 0.3–1.2)
Bilirubin, Direct: 0.4 mg/dL (ref 0.1–0.5)
Indirect Bilirubin: 0.6 mg/dL (ref 0.3–0.9)
Total Protein: 6.9 g/dL (ref 6.5–8.1)

## 2016-10-07 LAB — BASIC METABOLIC PANEL
Anion gap: 10 (ref 5–15)
BUN: 28 mg/dL — ABNORMAL HIGH (ref 6–20)
CALCIUM: 9.5 mg/dL (ref 8.9–10.3)
CHLORIDE: 92 mmol/L — AB (ref 101–111)
CO2: 33 mmol/L — AB (ref 22–32)
CREATININE: 1.3 mg/dL — AB (ref 0.44–1.00)
GFR calc non Af Amer: 41 mL/min — ABNORMAL LOW (ref >60)
GFR, EST AFRICAN AMERICAN: 48 mL/min — AB (ref >60)
GLUCOSE: 93 mg/dL (ref 65–99)
Potassium: 3.9 mmol/L (ref 3.5–5.1)
Sodium: 135 mmol/L (ref 135–145)

## 2016-10-07 LAB — PROTIME-INR
INR: 1.76
Prothrombin Time: 20.7 seconds — ABNORMAL HIGH (ref 11.4–15.2)

## 2016-10-07 LAB — CBC
HEMATOCRIT: 38.4 % (ref 36.0–46.0)
HEMOGLOBIN: 12.8 g/dL (ref 12.0–15.0)
MCH: 30 pg (ref 26.0–34.0)
MCHC: 33.3 g/dL (ref 30.0–36.0)
MCV: 90.1 fL (ref 78.0–100.0)
Platelets: 107 10*3/uL — ABNORMAL LOW (ref 150–400)
RBC: 4.26 MIL/uL (ref 3.87–5.11)
RDW: 15.2 % (ref 11.5–15.5)
WBC: 4.6 10*3/uL (ref 4.0–10.5)

## 2016-10-07 LAB — TSH: TSH: 1.535 u[IU]/mL (ref 0.350–4.500)

## 2016-10-07 MED ORDER — WARFARIN SODIUM 10 MG PO TABS: 10.0000 mg | ORAL_TABLET | Freq: Once | ORAL | 0 refills | Status: DC

## 2016-10-07 MED ORDER — TORSEMIDE 20 MG PO TABS: 20.0000 mg | ORAL_TABLET | Freq: Every day | ORAL | Status: DC

## 2016-10-07 MED ORDER — TORSEMIDE 20 MG PO TABS: 40.0000 mg | ORAL_TABLET | Freq: Every day | ORAL | Status: DC

## 2016-10-07 MED ORDER — LOSARTAN POTASSIUM 25 MG PO TABS: 12.5000 mg | ORAL_TABLET | Freq: Every day | ORAL | 0 refills | Status: DC

## 2016-10-07 MED ORDER — AMIODARONE HCL 200 MG PO TABS: 200.0000 mg | ORAL_TABLET | Freq: Two times a day (BID) | ORAL | 0 refills | Status: DC

## 2016-10-07 MED ORDER — TORSEMIDE 20 MG PO TABS: 40.0000 mg | ORAL_TABLET | Freq: Every morning | ORAL | 0 refills | Status: DC

## 2016-10-07 MED ORDER — LOSARTAN POTASSIUM 25 MG PO TABS: 12.5000 mg | ORAL_TABLET | Freq: Every day | ORAL | Status: DC

## 2016-10-07 MED ORDER — DIGOXIN 125 MCG PO TABS: 0.1250 mg | ORAL_TABLET | Freq: Every day | ORAL | Status: DC

## 2016-10-07 MED ORDER — TORSEMIDE 20 MG PO TABS: 20.0000 mg | ORAL_TABLET | Freq: Every day | ORAL | 0 refills | Status: DC

## 2016-10-07 MED ORDER — WARFARIN SODIUM 10 MG PO TABS: 10.0000 mg | ORAL_TABLET | Freq: Once | ORAL | Status: DC

## 2016-10-07 NOTE — Progress Notes (Signed)
Physical Therapy Treatment Patient Details Name: Misty GrayDolly J Gray MRN: [id number] DOB: 01/09/1951 Today's Date: 10/07/2016    History of Present Illness Misty Gray is a 65 y.o. female presenting with shortness of breath. + CHF; heart catherterization 10/20 PMH is significant for HFrEF (EF 30-35%), A-fib (on Coumadin), HTN, chronic venous insufficiency.    PT Comments    She denies holding onto furniture at home and does not use a device. Patient steady, however with decr velocity and arm swing (guarded) and admits she feels less confident with her walking than PTA. She feels ready to discharge home today and does have access to her husband's RW if needed.   Follow Up Recommendations  Home health PT;Supervision for mobility/OOB     Equipment Recommendations  None recommended by PT    Recommendations for Other Services       Precautions / Restrictions Precautions Precautions: Fall Restrictions Weight Bearing Restrictions: No    Mobility  Bed Mobility                  Transfers Overall transfer level: Modified independent Equipment used: Rolling walker (2 wheeled) (grab bar at toilet)             General transfer comment: no cues needed; steady  Ambulation/Gait Ambulation/Gait assistance: Supervision Ambulation Distance (Feet): 180 Feet Assistive device: Rolling walker (2 wheeled);None Gait Pattern/deviations: Step-through pattern;Decreased stride length;Trunk flexed   Gait velocity interpretation: Below normal speed for age/gender General Gait Details: Patient initially using RW "because that's what the doctor told me to do." Further assessed ambulation without device and pt more guarded with decr velocity.   Stairs Stairs:  (pt deferred; reports she goes up/down 9 steps at church )          Engineer, drillingWheelchair Mobility    Modified Rankin (Stroke Patients Only)       Balance Overall balance assessment: Needs assistance Sitting-balance support: No  upper extremity supported;Feet supported Sitting balance-Leahy Scale: Good     Standing balance support: No upper extremity supported Standing balance-Leahy Scale: Good Standing balance comment: stood to perform pericare without assist                    Cognition Arousal/Alertness: Awake/alert Behavior During Therapy: WFL for tasks assessed/performed Overall Cognitive Status: Within Functional Limits for tasks assessed                      Exercises      General Comments General comments (skin integrity, edema, etc.): Patient eager to go home.       Pertinent Vitals/Pain Pain Assessment: No/denies pain    Home Living               Home Equipment: Walker - 2 wheels;Cane - single point (both are her husband's (he doesn't use); gave away her RW)      Prior Function            PT Goals (current goals can now be found in the care plan section) Acute Rehab PT Goals Patient Stated Goal: To walk and go home Time For Goal Achievement: 10/17/16 Progress towards PT goals: Progressing toward goals    Frequency    Min 3X/week      PT Plan Current plan remains appropriate    Co-evaluation             End of Session   Activity Tolerance: Patient tolerated treatment well Patient left: in chair;with call bell/phone  within reach;with family/visitor present     Time: 0923-3007 PT Time Calculation (min) (ACUTE ONLY): 29 min  Charges:  $Gait Training: 8-22 mins $Therapeutic Activity: 8-22 mins                    G Codes:      Misty Gray 11-04-16, 2:09 PM  11/04/2016 Veda Canning, PT Pager: [phone]

## 2016-10-07 NOTE — Progress Notes (Signed)
Pt has orders to be discharged. Discharge instructions given and pt has no additional questions at this time. Medication regimen reviewed and pt educated. Pt verbalized understanding and has no additional questions. Telemetry box removed. IV removed and site in good condition. Pt stable and waiting for transportation.   Lehua Flores RN 

## 2016-10-07 NOTE — Progress Notes (Signed)
ANTICOAGULATION CONSULT NOTE - Follow Up Consult  Pharmacy Consult for Coumadin Indication: atrial fibrillation  Allergies  Allergen Reactions  . Aspirin Hives and Rash  . Penicillins Rash and Other (See Comments)    Amoxicillin also, Has patient had a PCN reaction causing immediate rash, facial/tongue/throat swelling, SOB or lightheadedness with hypotension: Yes Has patient had a PCN reaction causing severe rash involving mucus membranes or skin necrosis: No Has patient had a PCN reaction that required hospitalization No Has patient had a PCN reaction occurring within the last 10 years: Yes If all of the above answers are "NO", then may proceed with Cephalosporin use.   . Tape Other (See Comments)    NO PAPER TAPE-MUST BE MEDICAL TAPE  . Wool Alcohol [Lanolin] Hives  . Amoxicillin Rash  . Ampicillin Itching and Rash    Patient Measurements: Height: 5\' 4"  (162.6 cm) Weight: 197 lb 12.8 oz (89.7 kg) IBW/kg (Calculated) : 54.7  Vital Signs: Temp: 97.6 F (36.4 C) (10/23 0441) Temp Source: Oral (10/23 0441) BP: 86/52 (10/23 0441) Pulse Rate: 70 (10/23 0441)  Labs:  Recent Labs  10/04/16 1628  10/05/16 0407 10/06/16 0232 10/06/16 1121 10/07/16 0322  HGB  --   < > 13.1 13.6  --  12.8  HCT  --   --  38.6 40.9  --  38.4  PLT  --   --  106* 124*  --  107*  LABPROT  --   --  20.9* 20.5*  --  20.7*  INR  --   --  1.78 1.73  --  1.76  HEPARINUNFRC 0.34  --  0.55 0.41  --   --   CREATININE  --   --  0.95  --  1.00 1.30*  < > = values in this interval not displayed.  Estimated Creatinine Clearance: 45.5 mL/min (by C-G formula based on SCr of 1.3 mg/dL (H)).  Assessment: 65yof on coumadin pta for afib. Coumadin held on admit and she was bridged with heparin pending cath. Underwent cath 10/19 then coumadin resumed along with heparin bridge.  INR remains below goal today at 1.73. Heparin d/c'ed yesterday (INR close to goal and no hx CVA). New amiodarone added 10/21 so expect  to see her coumadin requirement decrease. CBC stable. No bleeding.  Home dose: 10mg  daily   Goal of Therapy:  INR 2-3 Monitor platelets by anticoagulation protocol: Yes   Plan:  1) Coumadin 10mg  x 1 2) Daily INR  Fredrik Rigger 10/07/2016,11:24 AM

## 2016-10-07 NOTE — Progress Notes (Addendum)
Advanced Heart Failure Rounding Note   Subjective:    Started on amio for frequent PVCs. Yesterday losartan was increased. Creatinine trending up. Weight down another 5 pounds.    Wants to go home.   INR 1.76. Pharmacy adjusting coumadin.   She has 81% RV pacing and 15-20 PVCs per minute   R/LHC 10/03/16 Ao = 94/58 (72) LV = 104/20 RA = 21 RV = 65/4/19 PA = 65/21 (38) PCW = 23 Fick cardiac output/index = 6.1/2.9 PVR = 2.5 WU FA sat = 97%  PA sat = 69%, 70%  Assessment: 1. Normal coronary arteries 2. NICM with EF 30-35% by echo 3. Persistently elevated biventricular pressures  Objective:   Weight Range:  Vital Signs:   Temp:  [97.6 F (36.4 C)-98.4 F (36.9 C)] 97.6 F (36.4 C) (10/23 0441) Pulse Rate:  [67-70] 70 (10/23 0441) Resp:  [16-18] 16 (10/23 0441) BP: (82-90)/(52-59) 86/52 (10/23 0441) SpO2:  [95 %-97 %] 95 % (10/23 0441) Weight:  [197 lb 12.8 oz (89.7 kg)] 197 lb 12.8 oz (89.7 kg) (10/23 0441) Last BM Date: 10/06/16  Weight change: Filed Weights   10/05/16 0532 10/06/16 0500 10/07/16 0441  Weight: 206 lb 11.2 oz (93.8 kg) 202 lb 1.6 oz (91.7 kg) 197 lb 12.8 oz (89.7 kg)    Intake/Output:   Intake/Output Summary (Last 24 hours) at 10/07/16 [id number] Last data filed at 10/07/16 0600  Gross per 24 hour  Intake              720 ml  Output             2500 ml  Net            -1780 ml     Physical Exam: General:  Sitting in chair.  HEENT: normal Neck: supple. JVP 7-88 Carotids 2+ bilat; no bruits. No thyromegaly or nodule noted.  Cor: PMI nonpalpable Irregular rate & rhythm. 2/6 TR Lungs: Diminished basilar sounds.  Abdomen: obese + abdominal hernial. Soft, NT, ND, no HSM. +BS  Extremities: no cyanosis, clubbing, rash, R and LLE UNNA boots present  Neuro: alert & orientedx3, cranial nerves grossly intact. moves all 4 extremities w/o difficulty. Affect pleasant   Telemetry: Reviewed, AF with frequent v-pacing 70s and PVCs  Labs: Basic  Metabolic Panel:  Recent Labs Lab 10/03/16 0424 10/04/16 0813 10/05/16 0407 10/06/16 0232 10/06/16 1121 10/07/16 0322  NA 138 135 133*  --  132* 135  K 3.5 3.9 3.6  --  4.2 3.9  CL 98* 91* 92*  --  90* 92*  CO2 33* 36* 31  --  33* 33*  GLUCOSE 96 83 89  --  98 93  BUN 16 14 19   --  22* 28*  CREATININE 0.91 0.89 0.95  --  1.00 1.30*  CALCIUM 9.4 9.9 9.6  --  9.9 9.5  MG  --  1.8  --  2.3  --   --     Liver Function Tests:  Recent Labs Lab 10/07/16 0322  AST 25  ALT 14  ALKPHOS 87  BILITOT 1.0  PROT 6.9  ALBUMIN 3.0*   No results for input(s): LIPASE, AMYLASE in the last 168 hours. No results for input(s): AMMONIA in the last 168 hours.  CBC:  Recent Labs Lab 10/02/16 0603 10/04/16 0813 10/05/16 0407 10/06/16 0232 10/07/16 0322  WBC 4.0 5.3 4.6 5.3 4.6  HGB 12.4 13.6 13.1 13.6 12.8  HCT 37.4 40.9 38.6 40.9 38.4  MCV 91.0 91.5 90.6 90.3 90.1  PLT 89* 125* 106* 124* 107*    Cardiac Enzymes:  Recent Labs Lab 09/30/16 1606 09/30/16 2106 10/01/16 0336  TROPONINI 0.03* 0.03* 0.03*    BNP: BNP (last 3 results)  Recent Labs  09/15/16 1124 09/30/16 1606  BNP 1,140.6* 927.9*    ProBNP (last 3 results) No results for input(s): PROBNP in the last 8760 hours.    Other results:  Imaging: No results found.   Medications:     Scheduled Medications: . amiodarone  200 mg Oral BID  . digoxin  0.0625 mg Oral Daily  . losartan  25 mg Oral Daily  . pantoprazole  40 mg Oral Daily  . PHENobarbital  64.8 mg Oral BID  . potassium chloride SA  20 mEq Oral BID  . sodium chloride flush  3 mL Intravenous Q12H  . spironolactone  25 mg Oral Daily  . torsemide  40 mg Oral BID  . Warfarin - Pharmacist Dosing Inpatient   Does not apply q1800    Infusions:    PRN Medications: sodium chloride, acetaminophen **OR** acetaminophen, acetaminophen, fluticasone, ondansetron **OR** ondansetron (ZOFRAN) IV, ondansetron (ZOFRAN) IV, polyethylene glycol, sodium  chloride flush   Assessment:   1. Acute on chronic systolic HF   --ECHO 10/01/16 EF 30-35% with inferior AK 2. LE cellulits 3. Morbid obesity 4. HTN 5. HL 6. Chronic AF with tach brady s/p pacemaker 7. Hypokalemia   Plan/Discussion:    Volume status stable. Overall diuresed 41 pounds. Hold diuretics today. Switch torsemide 40mg  am and 20 pm. Hold losartan today then restart 12.5 mg losartan tomorrow. Creatinine bump 1.0>1.3 (losartan increased 10/06/16)  BP to low to add b-blocker.   Pacemaker interrogated and tele reviewed in detail. Has 81% ventricular pacing. 15-20 PVCs per minute (~30%). Concerned she may have PVC cardiomyopathy. Amio started 10/21 to suppress and see if LV function improves. If EF does not improve, will need to consider CRT-D upgrade but has refused in past.   On coumadin. INR 1.76. Continue to load coumadin without bridge.   Will need paramedicine at d/c. HF team will set up. HF follow up set up.   Also needs HH.    HF Meds for D/C Torsemide 40am/20pm Losartan 12.5 mg daily start 10/24  Spironolactone 25 mg daily  Amiodarone 200 mg twice a day.  Digoxin 0.125 mg daily  Length of Stay: 7  [name] NP-C   10/07/2016, 8:12 AM  Advanced Heart Failure Team Pager [phone] (M-F; 7a - 4p)  Please contact [facility] for night-coverage after hours (4p -7a ) and weekends on [email or web address]  Patient seen and examined with Tonye Becket, NP. We discussed all aspects of the encounter. I agree with the assessment and plan as stated above.   Much improved. Can go home today. Renal function up slightly. Adjust diuretics and losartan as above. Tele reviewed. PVCs suppressed with amio. Still RV pacing. Will need to follow closely for LV recovery. If LV does not recover with suppression of PVCs will consider CRT-D upgrade.   We will arrange paramedicine f/u.   Modena Bellemare,MD 9:48 AM

## 2016-10-07 NOTE — Progress Notes (Signed)
Family Medicine Teaching Service Daily Progress Note Intern Pager: [phone]  [name] Medical record number: [id number] Date of birth: 22-Jan-1951 Age: 65 y.o. Gender: female  Primary Care Provider: Uvaldo Rising, MD Consultants: Heart failure team Code Status: Full   Pt Overview and Major Events to Date:  10/16: Admitted with HFrEF exacerbation 10/19: Normal left heart cath  Assessment and Plan: Misty Gray is a 65 y.o. female presenting with shortness of breath. PMH is significant for HFrEF (EF 30-35%), A-fib (on Coumadin), HTN, chronic venous insufficiency.  # Acute exacerbation of HFrEF: Echo 09/2016 with EF 30-35%. S/p left heart cath 10/19 which was normal. Being aggressively diuresed by heart failure team. Down 1.78L and >3 pounds over the past 24 hours. Down >20L and ~39 pounds since admission. BP currently too soft to titrate HF meds - Heart failure team consulted, appreciate assistance.  - Continue torsemide 40mg  daily, spironolactone 25mg  daily - Currently holding metolazone 5mg  bid as BP soft - Continue losartan 12.5mg  daily - Continue digoxin 0.125mg  daily - Hold beta blocker - Strict weights, daily I/Os  #Hypotension with history of Hypertension. Secondary to HFrEF and volume overload.  --Continue to monitor BPs closely --Diuretics and antihypertensives as above --Consider stopping losartan if persistently hypotensive.   #Atrial Fibrillation, rate-controlled. Chadsvasc 4.  INR: 1.76, continue to load coumadin.  --Hold beta blocker in setting of acute volume overload --Continue Digoxin 0.125mg  daily --Continue warfarin per pharmacy  AKI: in the setting of hypotension. Cr: up to 1.3 from 1.0 yesterday.  - continue to watch BMP - holding off giving fluids due to HF  #Chronic Venous Insufficiency, ongoing -- unna boots placed 10/21  #OSA. Echo on 10/17 showed worsening pulmonary hypertension. --Continue CPAP at night  --Oxygen and  concentrator have been ordered --She will likely need a new CPAP machine ordered when she is discharged.  FEN/GI: Heart healthy diet SLIV.  Prophylaxis: On Coumadin for A-fib, protonix  Disposition: Admitted to inpatient pending above management.  Anticipate discharge home once adequately diuresed.   Subjective:  Doing well this morning, no complaints.   Objective: Temp:  [97.6 F (36.4 C)-98.2 F (36.8 C)] 97.6 F (36.4 C) (10/23 0441) Pulse Rate:  [67-70] 70 (10/23 0441) Resp:  [16-18] 16 (10/23 0441) BP: (82-86)/(52-53) 86/52 (10/23 0441) SpO2:  [95 %-97 %] 95 % (10/23 0441) Weight:  [197 lb 12.8 oz (89.7 kg)] 197 lb 12.8 oz (89.7 kg) (10/23 0441) Physical Exam:  General: NAD. Well appearing. Speaking in full sentences. Cardiovascular: Irregularly irregular rhythm, no murmurs Respiratory: normal work of breathing. Clear to auscultation Gastrointestinal: +BS, soft, non-tender, non-distended MSK: unna boots in place Neuro: Alert and oriented x4, CN 2-12 grossly intact Psych: Normal behavior, normal affect  Laboratory:  Recent Labs Lab 10/05/16 0407 10/06/16 0232 10/07/16 0322  WBC 4.6 5.3 4.6  HGB 13.1 13.6 12.8  HCT 38.6 40.9 38.4  PLT 106* 124* 107*    Recent Labs Lab 10/05/16 0407 10/06/16 1121 10/07/16 0322  NA 133* 132* 135  K 3.6 4.2 3.9  CL 92* 90* 92*  CO2 31 33* 33*  BUN 19 22* 28*  CREATININE 0.95 1.00 1.30*  CALCIUM 9.6 9.9 9.5  PROT  --   --  6.9  BILITOT  --   --  1.0  ALKPHOS  --   --  87  ALT  --   --  14  AST  --   --  25  GLUCOSE 89 98 93  Imaging/Diagnostic Tests: None new.   Renne Muscaaniel L Libi Corso, MD 10/07/2016, 1:17 PM PGY-1, [facility] FPT[facility] Intern pager: [phone], text pages welcome

## 2016-10-07 NOTE — Progress Notes (Signed)
SATURATION QUALIFICATIONS: (This note is used to comply with regulatory documentation for home oxygen)  Patient Saturations on Room Air at Rest = 96%  Patient Saturations on Room Air while Ambulating = 95%  Patient Saturations on -- Liters of oxygen while Ambulating = --%  Please briefly explain why patient needs home oxygen:   Patient maintained SaO2>87% on room air.   10/07/2016 Veda Canning, PT Pager: [phone]

## 2016-10-08 ENCOUNTER — Telehealth: Payer: Self-pay | Admitting: *Deleted

## 2016-10-08 ENCOUNTER — Telehealth: Payer: Self-pay | Admitting: Family Medicine

## 2016-10-08 NOTE — Telephone Encounter (Signed)
Will forward to Molly Maduro to call patient for an appointment for INR check.   Spoke with Lorene Dy at [facility], she spoke with patient's son and he informed her that patient was just discharged from hospital.  Per Lorene Dy patient's son confirmed that patient is getting her INR checked at [facility].     Clovis Pu, RN

## 2016-10-08 NOTE — Telephone Encounter (Signed)
Patient has an appointment on 10/14/2016 for INR check. Misty Gray, Rodena Medin

## 2016-10-08 NOTE — Telephone Encounter (Signed)
RN staff - please call pharmacy back to let them know patient has an INR check this coming Monday 10/30. In reality this is probably not soon enough. Please call patient to attempt and schedule INR check later this week.

## 2016-10-08 NOTE — Telephone Encounter (Signed)
Nursing staff received call that patient is without oxygen concentrator.  Patient discharged from hospital on 10/24. She was informed to take home concentrator to [facility] to get replacement. Her son did so this past Friday however was turned away as [facility] did not have record of oxygen concentrator. Patient reports sob at night, Not able to lie flat. However sob is still significantly improved from time of admission. Discussed reasons to go to ED if needed.   I have reached out to Henderson Newcomer at [facility] to help with his issue.

## 2016-10-08 NOTE — Telephone Encounter (Signed)
Chrisie, Pharmacist with [facility] called to discuss patient's warfarin dosage.  She wanted to know how often the patient is getting her INR checked and to advised of potential interaction of Phenobarbital and amiodarone with warfarin.  Please give her a call at [phone].  Clovis Pu, RN

## 2016-10-08 NOTE — Telephone Encounter (Addendum)
Transitional Care Post-Discharge Follow-Up Phone Call:   Date of Discharge: 10/07/2016  Principal Discharge Diagnosis:  CHF exacerbation HFrEF Hypertension Atrial Fibrillation Chronic venous insufficiency OSA  Reason for Chronic Case Management: Co-morbidities as above  Post-discharge Communication: Call Completed: Yes, spoke with patient and patient's son, Fayrene Fearing [phone]) on 10/08/2016 from [phone]  Please check all that apply:  X Patient is knowledgeable of his/her condition(s) and/or treatment.  X Family and/or caregiver is knowledgeable of patient's condition(s) and/or treatment.  ? Patient is caring for self at home.  ? Patient is receiving home health services. If so, name of agency.   Medication Reconciliation:  ? Medication list reviewed with patient.  X Patient able to obtain needed medications. Yes, patient has all needed meds and Fayrene Fearing fills pill box for patient   Activities of Daily Living:  X  Independent  ? Needs assist (describe)  ? Total Care (describe)   Community resources in place for patient:  ? None  X Home Health ([facility]) Patient states 2 nurses from [facility] will be coming to patient's home tomorrow ? Assisted Living  ? Hospice  ? Support Group   Topics discussed: Patient states that she lives with her husband, son, grandchildren and nephew. Son cooks most meals and takes her to her doctor's appts. Patient states she has scale with I-Pad provided by [facility] but hasn't been weighing self since hospital discharge. Patient instructed to weigh daily upon waking and after voiding with same amount of clothing. Patient instructed to record daily weights and bring log (I-Pad) to all future appts. Patient states that her oxygen is not working properly and [facility] has no record of patient's oxygen use. James requesting Rx for oxygen be sent to [facility]. Note sent to PCP with this request. HFU appt made with Dr. Leveda Anna for 10/17/2016 at 1100 (PCP unavailable for  next two weeks) Alecia Lemming, RN, BSN

## 2016-10-09 ENCOUNTER — Other Ambulatory Visit: Payer: Self-pay | Admitting: Family Medicine

## 2016-10-09 ENCOUNTER — Telehealth: Payer: Self-pay

## 2016-10-09 ENCOUNTER — Other Ambulatory Visit: Payer: Self-pay | Admitting: *Deleted

## 2016-10-09 DIAGNOSIS — I5021 Acute systolic (congestive) heart failure: Secondary | ICD-10-CM | POA: Diagnosis not present

## 2016-10-09 DIAGNOSIS — Z9981 Dependence on supplemental oxygen: Secondary | ICD-10-CM | POA: Diagnosis not present

## 2016-10-09 DIAGNOSIS — I4891 Unspecified atrial fibrillation: Secondary | ICD-10-CM | POA: Diagnosis not present

## 2016-10-09 DIAGNOSIS — G4733 Obstructive sleep apnea (adult) (pediatric): Secondary | ICD-10-CM | POA: Diagnosis not present

## 2016-10-09 DIAGNOSIS — Z7901 Long term (current) use of anticoagulants: Secondary | ICD-10-CM | POA: Diagnosis not present

## 2016-10-09 DIAGNOSIS — I110 Hypertensive heart disease with heart failure: Secondary | ICD-10-CM | POA: Diagnosis not present

## 2016-10-09 DIAGNOSIS — Z87891 Personal history of nicotine dependence: Secondary | ICD-10-CM | POA: Diagnosis not present

## 2016-10-09 DIAGNOSIS — I872 Venous insufficiency (chronic) (peripheral): Secondary | ICD-10-CM | POA: Diagnosis not present

## 2016-10-09 MED ORDER — WARFARIN SODIUM 5 MG PO TABS: 10.0000 mg | ORAL_TABLET | Freq: Once | ORAL | 1 refills | Status: DC

## 2016-10-09 NOTE — Telephone Encounter (Signed)
Spoke to patient regarding screening for [employer] HF study. Patient states she is interested in the study and appt made for 11/03 @ 11 am; after Dr. Gala Romney appointment. States she is doing well today. Wt stable at 197lbs. Verbalizes she will follow HF instructions and call with any signs or symptoms of exacerbation.

## 2016-10-09 NOTE — Patient Outreach (Signed)
[facility]) Care Management  10/09/2016  KONICA MCHARG [birth year] [id number]   Referral received from hospital liaison to initiate transition of care program once member discharged.  She was admitted for heart failure, discharged on 10/23.  According to chart, she also has history of atrial fibrillation, hypertension, GERD, chronic venous insufficiency, and seizure disorder.  Call placed to member, identity verified.  This care manager introduces self and purpose of call.  [facility] care management services explained.  She state that she is doing much better, states "they got a lot of water weight off me."  She denies any shortness of breath or discomfort at this time.  She confirms that she has telemonitoring equipment from [employer], but has not weighed herself today.  She state that she will begin tomorrow morning.  She report that her last weight was 197 pounds, and state that this is her target weight.  She confirms that she has heart failure education, and is aware of when to contact the physician.  She reports medication compliance.  Patient was recently discharged from hospital and all medications have been reviewed.  She denies concerns at this time, agrees to initial home visit next week.  Kemper Durie, [place], MSN [facility] Care Management  [facility] Manager [phone]

## 2016-10-09 NOTE — Progress Notes (Signed)
Received note from [facility] stating that patient may need to have an ambulatory visit to document need for oxygen. As patient recently admitted I have sent a message back to [facility] to see if she qualifies from that visit. Also informed that I need to write an order to "get CPAP checked" in order for her to get her CPAP machine fixed. However I am not able to find this order. Will check back with [facility].

## 2016-10-10 ENCOUNTER — Telehealth: Payer: Self-pay | Admitting: Family Medicine

## 2016-10-10 NOTE — Telephone Encounter (Signed)
Pt has been without oxygen for three days, the company keeps giving her the run around. Please advise. Thanks! ep

## 2016-10-10 NOTE — Telephone Encounter (Signed)
Will forward to PCP to see if he has heard back from [facility].

## 2016-10-11 ENCOUNTER — Encounter: Payer: Self-pay | Admitting: Family Medicine

## 2016-10-11 ENCOUNTER — Other Ambulatory Visit: Payer: Self-pay | Admitting: Family Medicine

## 2016-10-11 ENCOUNTER — Ambulatory Visit: Payer: Medicare Other | Admitting: Family Medicine

## 2016-10-11 ENCOUNTER — Ambulatory Visit (INDEPENDENT_AMBULATORY_CARE_PROVIDER_SITE_OTHER): Payer: Medicare Other | Admitting: Family Medicine

## 2016-10-11 DIAGNOSIS — Z87891 Personal history of nicotine dependence: Secondary | ICD-10-CM | POA: Diagnosis not present

## 2016-10-11 DIAGNOSIS — I5023 Acute on chronic systolic (congestive) heart failure: Secondary | ICD-10-CM

## 2016-10-11 DIAGNOSIS — I519 Heart disease, unspecified: Secondary | ICD-10-CM

## 2016-10-11 DIAGNOSIS — I4891 Unspecified atrial fibrillation: Secondary | ICD-10-CM | POA: Diagnosis not present

## 2016-10-11 DIAGNOSIS — I872 Venous insufficiency (chronic) (peripheral): Secondary | ICD-10-CM | POA: Diagnosis not present

## 2016-10-11 DIAGNOSIS — G4733 Obstructive sleep apnea (adult) (pediatric): Secondary | ICD-10-CM | POA: Diagnosis not present

## 2016-10-11 DIAGNOSIS — I110 Hypertensive heart disease with heart failure: Secondary | ICD-10-CM | POA: Diagnosis not present

## 2016-10-11 DIAGNOSIS — Z7901 Long term (current) use of anticoagulants: Secondary | ICD-10-CM | POA: Diagnosis not present

## 2016-10-11 DIAGNOSIS — Z9981 Dependence on supplemental oxygen: Secondary | ICD-10-CM | POA: Diagnosis not present

## 2016-10-11 DIAGNOSIS — I5021 Acute systolic (congestive) heart failure: Secondary | ICD-10-CM | POA: Diagnosis not present

## 2016-10-11 LAB — POCT INR: INR: 1.9

## 2016-10-11 NOTE — Telephone Encounter (Signed)
Pt still has not received oxygen. Please advise. Thanks! ep

## 2016-10-11 NOTE — Patient Instructions (Signed)
It was nice to see you today.  [facility] and check of status of oxygen concentrator.

## 2016-10-11 NOTE — Telephone Encounter (Signed)
Apologized to pt for Dr. Randolm Idol. Told pt we are working on it, but having a hard time with [facility] and we would be in touch this afternoon. ep

## 2016-10-11 NOTE — Assessment & Plan Note (Signed)
Patient stable s/p recent hospitalization. Face to face visit completed to obtain home oxygen/concentrator.

## 2016-10-11 NOTE — Progress Notes (Signed)
   Subjective:    Patient ID: Misty Gray, female    DOB: December 11, 1951, 65 y.o.   MRN: [id number]  HPI 65 y/o female presents for Medicare face to face visit.   Patient has history of Morbid Obesity, OSA, Atrial Fibrillation, Pulmonary HTN, and Systolic CHF. Recently hospitalized an [facility] for CHF exacerbation. Has upcoming TOC appointment with Dr. Leveda Anna.    Prior to admission patient noted that CPAP and home oxygen concentrator were not functioning. Her son took concentrator to [facility] to get fixed however [facility] did not have a record of the device and therefore was unable to fix/provide replacement. Multiple communications between Dr. Randolm Idol and [facility] made to attempt to get CPAP and concentrator replaced since time of discharge (see previous telephone notes and messages). Order placed on 10/27 to get CPAP repaired [facility] notified Dr. Randolm Idol of receipt). Patient needed face to face visit to restart process of getting home oxygen/concentrator. Patient has been without home oxygen since time of discharge.   Patient has history of Morbid Obesity, OSA, Atrial Fibrillation, Pulmonary HTN, and Systolic CHF. Patient needs oxygen to help maintain oxygen saturations while on CPAP at night due to the before mentioned medical problems. Patient has increase dyspnea with exertion and while lying flat.    Review of Systems  Constitutional: Negative for chills, fatigue and fever.  Respiratory: Positive for shortness of breath. Negative for cough and choking.   Cardiovascular: Positive for leg swelling. Negative for chest pain.       Objective:   Physical Exam Saturation Qualifications:  Patient Saturation on room air at rest: 97% Patient Saturation on room air while ambulating: 91% Patient Saturation on 2L [place] 93%  Gen: pleasant female, NAD Cardiac: Irregular Irregular rate and rhythym Resp: CTAB, normal effort Ext: in unna boots      Assessment & Plan:  Chronic systolic  dysfunction of left ventricle Patient stable s/p recent hospitalization. Face to face visit completed to obtain home oxygen/concentrator.

## 2016-10-14 ENCOUNTER — Other Ambulatory Visit: Payer: Self-pay | Admitting: *Deleted

## 2016-10-14 ENCOUNTER — Encounter: Payer: Self-pay | Admitting: *Deleted

## 2016-10-14 ENCOUNTER — Ambulatory Visit (INDEPENDENT_AMBULATORY_CARE_PROVIDER_SITE_OTHER): Payer: Medicare Other | Admitting: *Deleted

## 2016-10-14 DIAGNOSIS — I4891 Unspecified atrial fibrillation: Secondary | ICD-10-CM | POA: Diagnosis not present

## 2016-10-14 NOTE — Patient Outreach (Signed)
[facility]) Care Management   10/14/2016  Misty Gray [birth year] [id number]  Misty Gray is an 65 y.o. female  Subjective:   Member state that she is doing well.  She report that she is working with home health nursing and weighs herself daily.  She denies any increases in weight, denies shortness of breath or chest discomfort.  She does have leg wraps, state they were placed last week and will be changed later this week when she has her follow up appointment.    Objective:   Review of Systems  Constitutional: Negative.   HENT: Negative.   Eyes: Negative.   Respiratory: Negative.   Cardiovascular: Positive for leg swelling.  Gastrointestinal: Negative.   Genitourinary: Negative.   Musculoskeletal: Negative.   Skin: Negative.   Neurological: Negative.   Endo/Heme/Allergies: Negative.   Psychiatric/Behavioral: Negative.     Physical Exam  Constitutional: She is oriented to person, place, and time. She appears well-developed and well-nourished.  Neck: Normal range of motion.  Cardiovascular: Normal rate, regular rhythm and normal heart sounds.   Respiratory: Effort normal and breath sounds normal.  GI: Soft. Bowel sounds are normal.  Musculoskeletal: Normal range of motion.  Neurological: She is alert and oriented to person, place, and time.  Skin: Skin is warm and dry.   BP 98/62   Pulse 67   Resp 18   Ht 1.575 m (5' 2")   Wt 197 lb 9.6 oz (89.6 kg)   SpO2 97%   BMI 36.14 kg/m   Encounter Medications:   Outpatient Encounter Prescriptions as of 10/14/2016  Medication Sig  . amiodarone (PACERONE) 200 MG tablet Take 1 tablet (200 mg total) by mouth 2 (two) times daily.  . [facility] VITAMIN D3 1000 units capsule TAKE ONE CAPSULE BY MOUTH EVERY MORNING (Patient taking differently: TAKE ONE CAPSULE (1000 units) BY MOUTH EVERY MORNING)  . digoxin (LANOXIN) 0.125 MG tablet TAKE 1 TABLET (125 MCG TOTAL) BY MOUTH DAILY.  . fluticasone (FLONASE) 50 MCG/ACT  nasal spray Place 2 sprays into both nostrils daily as needed. For congestion.  Marland Kitchen losartan (COZAAR) 25 MG tablet Take 0.5 tablets (12.5 mg total) by mouth daily.  Marland Kitchen nystatin (MYCOSTATIN/NYSTOP) powder Apply topically 4 (four) times daily.  Marland Kitchen omeprazole (PRILOSEC) 20 MG capsule TAKE ONE CAPSULE BY MOUTH EVERY DAY (Patient taking differently: TAKE ONE CAPSULE (20 mg) BY MOUTH EVERY DAY)  . PHENobarbital (LUMINAL) 32.4 MG tablet TAKE 2 TABLETS BY MOUTH TWICE A DAY (Patient taking differently: TAKE 2 TABLETS (64.8 mg) BY MOUTH TWICE A DAY)  . spironolactone (ALDACTONE) 25 MG tablet TAKE 1 TABLET (25 MG TOTAL) BY MOUTH DAILY.  Marland Kitchen torsemide (DEMADEX) 20 MG tablet Take 1 tablet (20 mg total) by mouth at bedtime. Take this at night  . torsemide (DEMADEX) 20 MG tablet Take 2 tablets (40 mg total) by mouth every morning. Take this in the morning.  . warfarin (COUMADIN) 5 MG tablet Take 2 tablets (10 mg total) by mouth one time only at 6 PM.  . OXYGEN Inhale 2 L into the lungs at bedtime. cpap machine   No facility-administered encounter medications on file as of 10/14/2016.     Functional Status:   In your present state of health, do you have any difficulty performing the following activities: 10/14/2016 10/01/2016  Hearing? N -  Vision? N -  Difficulty concentrating or making decisions? N -  Walking or climbing stairs? Y -  Dressing or bathing? N -  Doing errands, shopping? Tempie Donning  Preparing Food and eating ? N -  Using the Toilet? N -  In the past six months, have you accidently leaked urine? Y -  Do you have problems with loss of bowel control? N -  Managing your Medications? N -  Managing your Finances? N -  Housekeeping or managing your Housekeeping? N -  Some recent data might be hidden    Fall/Depression Screening:    PHQ 2/9 Scores 10/14/2016 09/26/2016 09/19/2016 09/02/2016 08/30/2016 07/22/2016 06/25/2016  PHQ - 2 Score 0 0 0 0 0 0 0   Fall Risk  10/14/2016 09/26/2016 09/19/2016 09/02/2016  08/30/2016  Falls in the past year? _0   Risk for fall due to : - - - - -    Assessment:    Met with member at scheduled time.  Son, who serves as TEFL teacher caregiver when needed, also present.  Member/son denies need for Education officer, museum and/or pharmacy involvement.  She does express concern regarding cost of medications.  Son state that her medication cost will decrease if she changes her pharmacy, which he state he has been attempting to do.  Member now state that she is willing to look into another pharmacy if it will help financially.    Member received from physician during home visit regarding oxygen status.  She was told that her test in the office last week did not indicate that she needed home O2.  Son has been instructed that an order for overnight oximetry study has been placed to assess need for O2 at night.    Son helps member manage medications, provides transportation to physician visits.  He also help with management of heart failure (diet control & daily weights).  Member provided with [facility] calender tool book, heart failure zones and weight log reviewed.  They deny any questions at this time.  Provided with contact information, advised to contact with concerns.   Plan:   Will follow up with member next week for weekly transition of care call.  [facility] CM Care Plan Problem One   Flowsheet Row Most Recent Value  Care Plan Problem One  Risk for readmission related to heart failure as evidenced by recent admission  Role Documenting the Problem One  Care Management [place] for Problem One  Active  THN Long Term Goal (31-90 days)  Member will not be readmitted to hospital within 31 days of discharge  Operating Room Services Long Term Goal Start Date  10/09/16  Interventions for Problem One Long Term Goal  Discussed with member the importance of following discharge instructions, including follow up appointments, medications, diet, and home health involvement, to decrease the risk of  readmission  THN CM Short Term Goal #1 (0-30 days)  Member will weigh and record weight daily over the next 4 weeks  THN CM Short Term Goal #1 Start Date  10/09/16  Interventions for Short Term Goal #1  Educated member on the importance of daily weights.  Confirmed with member that she has working scale, also has telemonitoring equipment from [place] Goal #2 (0-30 days)  Member will keep and attend follow up appointments with PCP and cardiologist over the next 4 weeks  THN CM Short Term Goal #2 Start Date  10/09/16  Interventions for Short Term Goal #2  Educated member on importance of follow up appointments and the effect it has on decreasing readmission rates.  Offered assistance with transportation,member denies  need, confirms she has transportation.     Valente David, [place], MSN [place] [phone]

## 2016-10-14 NOTE — Addendum Note (Signed)
Addended by: Uvaldo Rising on: 10/14/2016 01:57 PM   Modules accepted: Orders

## 2016-10-15 ENCOUNTER — Encounter: Payer: Self-pay | Admitting: *Deleted

## 2016-10-15 DIAGNOSIS — I5021 Acute systolic (congestive) heart failure: Secondary | ICD-10-CM | POA: Diagnosis not present

## 2016-10-15 DIAGNOSIS — I110 Hypertensive heart disease with heart failure: Secondary | ICD-10-CM | POA: Diagnosis not present

## 2016-10-15 DIAGNOSIS — Z87891 Personal history of nicotine dependence: Secondary | ICD-10-CM | POA: Diagnosis not present

## 2016-10-15 DIAGNOSIS — Z9981 Dependence on supplemental oxygen: Secondary | ICD-10-CM | POA: Diagnosis not present

## 2016-10-15 DIAGNOSIS — I872 Venous insufficiency (chronic) (peripheral): Secondary | ICD-10-CM | POA: Diagnosis not present

## 2016-10-15 DIAGNOSIS — I4891 Unspecified atrial fibrillation: Secondary | ICD-10-CM | POA: Diagnosis not present

## 2016-10-15 DIAGNOSIS — G4733 Obstructive sleep apnea (adult) (pediatric): Secondary | ICD-10-CM | POA: Diagnosis not present

## 2016-10-15 DIAGNOSIS — Z7901 Long term (current) use of anticoagulants: Secondary | ICD-10-CM | POA: Diagnosis not present

## 2016-10-16 ENCOUNTER — Inpatient Hospital Stay (HOSPITAL_COMMUNITY): Payer: Medicare Other | Admitting: Internal Medicine

## 2016-10-17 ENCOUNTER — Encounter: Payer: Self-pay | Admitting: Family Medicine

## 2016-10-17 ENCOUNTER — Ambulatory Visit (INDEPENDENT_AMBULATORY_CARE_PROVIDER_SITE_OTHER): Payer: Medicare Other | Admitting: *Deleted

## 2016-10-17 ENCOUNTER — Ambulatory Visit (INDEPENDENT_AMBULATORY_CARE_PROVIDER_SITE_OTHER): Payer: Medicare Other | Admitting: Family Medicine

## 2016-10-17 VITALS — BP 108/60 | HR 70 | Temp 98.0°F | Ht 62.0 in | Wt 198.8 lb

## 2016-10-17 DIAGNOSIS — I519 Heart disease, unspecified: Secondary | ICD-10-CM

## 2016-10-17 DIAGNOSIS — I4891 Unspecified atrial fibrillation: Secondary | ICD-10-CM

## 2016-10-17 DIAGNOSIS — N179 Acute kidney failure, unspecified: Secondary | ICD-10-CM

## 2016-10-17 DIAGNOSIS — Z9181 History of falling: Secondary | ICD-10-CM | POA: Diagnosis not present

## 2016-10-17 DIAGNOSIS — I872 Venous insufficiency (chronic) (peripheral): Secondary | ICD-10-CM

## 2016-10-17 DIAGNOSIS — Z7901 Long term (current) use of anticoagulants: Secondary | ICD-10-CM | POA: Diagnosis not present

## 2016-10-17 LAB — POCT INR: INR: 6.2

## 2016-10-17 LAB — PROTIME-INR
INR: 5.64 — AB
Prothrombin Time: 52.7 seconds — ABNORMAL HIGH (ref 11.4–15.2)

## 2016-10-18 ENCOUNTER — Other Ambulatory Visit: Payer: Self-pay

## 2016-10-18 ENCOUNTER — Encounter (HOSPITAL_COMMUNITY): Payer: Self-pay | Admitting: Internal Medicine

## 2016-10-18 ENCOUNTER — Ambulatory Visit (HOSPITAL_COMMUNITY)
Admission: RE | Admit: 2016-10-18 | Discharge: 2016-10-18 | Disposition: A | Discharge status: Home / Self Care | Payer: Medicare Other | Source: Ambulatory Visit | Attending: Internal Medicine | Admitting: Internal Medicine

## 2016-10-18 ENCOUNTER — Telehealth (HOSPITAL_COMMUNITY): Payer: Self-pay | Admitting: *Deleted

## 2016-10-18 VITALS — BP 106/70 | HR 68 | Wt 199.2 lb

## 2016-10-18 DIAGNOSIS — I519 Heart disease, unspecified: Secondary | ICD-10-CM

## 2016-10-18 DIAGNOSIS — I493 Ventricular premature depolarization: Secondary | ICD-10-CM

## 2016-10-18 DIAGNOSIS — I429 Cardiomyopathy, unspecified: Secondary | ICD-10-CM | POA: Diagnosis not present

## 2016-10-18 DIAGNOSIS — Z006 Encounter for examination for normal comparison and control in clinical research program: Secondary | ICD-10-CM

## 2016-10-18 DIAGNOSIS — I482 Chronic atrial fibrillation: Secondary | ICD-10-CM

## 2016-10-18 DIAGNOSIS — I5022 Chronic systolic (congestive) heart failure: Secondary | ICD-10-CM | POA: Insufficient documentation

## 2016-10-18 DIAGNOSIS — I4821 Permanent atrial fibrillation: Secondary | ICD-10-CM

## 2016-10-18 DIAGNOSIS — Z950 Presence of cardiac pacemaker: Secondary | ICD-10-CM | POA: Diagnosis not present

## 2016-10-18 DIAGNOSIS — I110 Hypertensive heart disease with heart failure: Secondary | ICD-10-CM | POA: Insufficient documentation

## 2016-10-18 LAB — BASIC METABOLIC PANEL WITH GFR
BUN: 32 mg/dL — AB (ref 7–25)
CALCIUM: 9.3 mg/dL (ref 8.6–10.4)
CHLORIDE: 102 mmol/L (ref 98–110)
CO2: 28 mmol/L (ref 20–31)
CREATININE: 1.01 mg/dL — AB (ref 0.50–0.99)
GFR, Est African American: 67 mL/min (ref >=60)
GFR, Est Non African American: 58 mL/min — ABNORMAL LOW (ref >=60)
GLUCOSE: 103 mg/dL — AB (ref 65–99)
Potassium: 3.5 mmol/L (ref 3.5–5.3)
Sodium: 141 mmol/L (ref 135–146)

## 2016-10-18 LAB — COMPREHENSIVE METABOLIC PANEL
ALBUMIN: 3.4 g/dL — AB (ref 3.5–5.0)
ALK PHOS: 91 U/L (ref 38–126)
ALT: 19 U/L (ref 14–54)
ANION GAP: 9 (ref 5–15)
AST: 30 U/L (ref 15–41)
BILIRUBIN TOTAL: 1 mg/dL (ref 0.3–1.2)
BUN: 28 mg/dL — ABNORMAL HIGH (ref 6–20)
CALCIUM: 9.4 mg/dL (ref 8.9–10.3)
CO2: 29 mmol/L (ref 22–32)
Chloride: 102 mmol/L (ref 101–111)
Creatinine, Ser: 0.99 mg/dL (ref 0.44–1.00)
GFR, EST NON AFRICAN AMERICAN: 58 mL/min — AB (ref >60)
Glucose, Bld: 91 mg/dL (ref 65–99)
Potassium: 3.5 mmol/L (ref 3.5–5.1)
Sodium: 140 mmol/L (ref 135–145)
TOTAL PROTEIN: 7.7 g/dL (ref 6.5–8.1)

## 2016-10-18 LAB — DIGOXIN LEVEL: DIGOXIN LVL: 1.1 ng/mL (ref 0.8–2.0)

## 2016-10-18 MED ORDER — LOSARTAN POTASSIUM 25 MG PO TABS: 25.0000 mg | ORAL_TABLET | Freq: Every day | ORAL | 3 refills | Status: DC

## 2016-10-18 MED ORDER — DIGOXIN 125 MCG PO TABS: 0.0625 mg | ORAL_TABLET | Freq: Every day | ORAL | 1 refills | Status: DC

## 2016-10-18 MED ORDER — TORSEMIDE 20 MG PO TABS: ORAL_TABLET | ORAL | 3 refills | Status: DC

## 2016-10-18 NOTE — Assessment & Plan Note (Signed)
Supervised by son.

## 2016-10-18 NOTE — Progress Notes (Signed)
Spoke to patient and her son at length about [employer] Study; both state "we interested in anything that may help with heart failure." Dr. Gala Romney seeing patient today for post hospital CHF. GDMT medications on board but not at optimal doses. Losartan dose increased today by Dr. Gala Romney. Research Study Coordinator will follow patient and observe GDMT.  Next HF Clinic visit 11/17.

## 2016-10-18 NOTE — Patient Instructions (Addendum)
Labs today (will call for abnormal results, otherwise no news is good news)  EKG today  Increase Torsemide 40mg  (2 tabs) in AM, 20mg  (1 tab) in PM  Increase Losartan to 25mg  Daily  Follow up with NP in 2 weeks  Follow up with Dr. Gala Romney in 2 months

## 2016-10-18 NOTE — Assessment & Plan Note (Signed)
Stable on current meds.  May need a little more diuresis.  Await BMP

## 2016-10-18 NOTE — Assessment & Plan Note (Signed)
Still with edema.  Check BMP before further diuresis.  No evidence of cellulitis.

## 2016-10-18 NOTE — Progress Notes (Signed)
Subjective:    Patient ID: Misty Gray, female    DOB: 02-16-1951, 65 y.o.   MRN: [id number]  HPI TRANSITION OF CARE   Primary Care Physician (PCP): [facility]                                                   [address]                                                   [place], [place] [address]                                                   [place]   Type of visit: (Telephone/Face-to-face): face to face   Date of Admission: 09/30/16  Date of Discharge: 10/07/16  Discharged from: Hospital:(SNF, LTAC, Inpatient Rehab or other) home   Discharge Diagnosis :Acute on chronic CHF  Summary of Admission: Patient was admitted for CHF exacerbation, patient with bilateral crackles and pitting edema. Patient was aggressively diuresed throughout admission with IV and metolazone. Patient was down >20L and ~41lb since admission. Patient had a repeat echocardiogram on 10/17 which showed an EF 30-35% with some apical and lateral akinesis, and worsening pulmonary hypertension. Patient also had a left heart catheterization which showed normal coronary arteries and elevated biventricular pressures. Weight on discharge:197 lb    TODAY's VISIT  Patient/Caregiver self-reported problems/concerns: Comes in with son.  Patient doing well.  Wants una boots off.    PHYSICAL EXAM: Wt 198 - DC wt was 197.  Lungs clear Cardiac irreg with 2/6 SEM 2+ bilateral ext edema. Red of chronic venous stasis.  No cellulitis changes.    ASSESSMENT  Patient Medical Status: Stable  Active Diagnoses:Chronic CHF, stable venous insufficiency.  Does the patient have the support of a caregiver?: yes, son If yes, name of caregiver: son Describe level of support the caregiver provides: Lives with him, Carries to physician appointments.  Supervises medications. Confidence of patient and/or caregiver to carry out care at home: good  confidence.     Does patient live alone?: no If no, who does patient live with?: son   Does patient/ caregiver have concerns about access to food? no If yes, describe: NA  Are there stairs in the home?: **  Is the home dwelling safe?: uncertain If not safe, indicate concerns:   Fall Risk Assessment: moderate fall risk   MEDICATIONS  Medication Reconciliation conducted with patient or caregiver? Yes/No: yes, no new meds  New medications prescribed/discontinued upon discharge ? Yes/No: Yes and patient list is up to date  Describe how patient takes medication: as prescribed   Barriers identified related to medications:none identified   HOME CARE SERVICES DME Ordered: Yes No If ordered,  describe:    PATIENT EDUCATION PROVIDED: Yes  FOLLOW-UP : with PCP in 2 weeks.  Has heart failure team FU in one day.   SIGNATURE AND DATE: sign at note completion.   Review of Systems     Objective:   Physical Exam        Assessment & Plan:

## 2016-10-18 NOTE — Telephone Encounter (Signed)
Notes Recorded by Modesta Messing, CMA on 10/18/2016 at 4:25 PM EDT Patient aware. Added to lab schedule 11/10  ------  Notes Recorded by Dolores Patty, MD on 10/18/2016 at 4:20 PM EDT Cut digoxin in half. Recheck in 1 week.    Ref Range & Units 11:16 2wk ago 4mo ago   Digoxin Level 0.8 - 2.0 ng/mL 1.1  0.4   0.4

## 2016-10-18 NOTE — Assessment & Plan Note (Signed)
Check BMP for concerns of diuretic dosing.

## 2016-10-18 NOTE — Progress Notes (Addendum)
ADVANCED HF CLINIC NOTE   HPI:  66 y/o woman with history of morbid obesity, OSA, chronic atrial fibrillation with tachy-brady syndrome s/p PPM, frequent PVCs, systolic HF due to NICM.  Recently admitted for ADHF in 10/17. Cath with normal coronary arteries and elevated filling pressures. Echo 10/17 EF 30-35% (previously 35-40%). 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 (She has 81% RV pacing and 15-20 PVCs per minute). Started on po amio with suppression of PVCs. Diuresed 41 pounds. Discharge weight 197.   Here with her son. Feeling better. Weighing every day. When she got home weight 196. Today 199. BP really good. No dizzines. Able to do ADLs without too much trouble,    [facility] 10/03/16 Ao = 94/58 (72) LV = 104/20 RA = 21 RV = 65/4/19 PA = 65/21 (38) PCW = 23 Fick cardiac output/index = 6.1/2.9 PVR = 2.5 WU FA sat = 97%  PA sat = 69%, 70%  Assessment: 1. Normal coronary arteries 2. NICM with EF 30-35% by echo 3. Persistently elevated biventricular pressures    Past Medical History:  Diagnosis Date  . Abnormal CT of the head 12/16/1984   R parietal atrophy  . Allergic rhinitis, cause unspecified   . Anemia   . Arthritis    "hands and knees" (09/06/2015)  . Atrial fibrillation (HCC)   . Cellulitis and abscess of leg 09/2016   bilateral  . CHF (congestive heart failure) (HCC)   . Diaphragmatic hernia without mention of obstruction or gangrene   . Dyspnea   . Exertional dyspnea 06/22/12  . Family history of adverse reaction to anesthesia    "daughter fights w/them; just can't relax during OR; stay awake during the OR"  . Female stress incontinence   . GERD (gastroesophageal reflux disease)   . Grand mal seizure (HCC)   . H/O hiatal hernia    two removed  . Heart murmur   . High cholesterol   . History of blood transfusion 1956   "related to nose bleed"  . Hypertension   . Influenza A 01/11/2014  . Lichenification and lichen simplex chronicus   . Migraine      "when I was a teenager"  . Morbid obesity (HCC)   . Nonischemic cardiomyopathy (HCC)    EF has normalized-repeat Pending   . On home oxygen therapy    "2L when I'm asleep in bed" (09/06/2015)  . OSA on CPAP   . Pacemaker  st judes    Patient states "this is my third pacemaker"  . Pneumonia 2004; 2011; 11/2011  . Seizures (HCC) since 1981   "can hear you talking but sounds like you are in big tunnel; have them often if not taking RX; last one was 06/17/12" (09/06/2015)  . Sick sinus syndrome (HCC)    WITH PRIOR DDD PACEMAKER IMPLANTATION  . Stroke [facility]) 1988   "mouth drawed real bad on my left side; found out it was a seizure"  . Syncope and collapse 06/22/12   "hit forehead and left knee"; denies loss of consciousness  . Unspecified venous (peripheral) insufficiency     Current Outpatient Prescriptions  Medication Sig Dispense Refill  . amiodarone (PACERONE) 200 MG tablet Take 1 tablet (200 mg total) by mouth 2 (two) times daily. 30 tablet 0  . CVS VITAMIN D3 1000 units capsule TAKE ONE CAPSULE BY MOUTH EVERY MORNING (Patient taking differently: TAKE ONE CAPSULE (1000 units) BY MOUTH EVERY MORNING) 90 capsule 1  . digoxin (LANOXIN) 0.125 MG tablet TAKE  1 TABLET (125 MCG TOTAL) BY MOUTH DAILY. 90 tablet 1  . fluticasone (FLONASE) 50 MCG/ACT nasal spray Place 2 sprays into both nostrils daily as needed. For congestion. 16 g 1  . losartan (COZAAR) 25 MG tablet Take 0.5 tablets (12.5 mg total) by mouth daily. 30 tablet 0  . nystatin (MYCOSTATIN/NYSTOP) powder Apply topically 4 (four) times daily. 45 g 1  . omeprazole (PRILOSEC) 20 MG capsule TAKE ONE CAPSULE BY MOUTH EVERY DAY 90 capsule 1  . OXYGEN Inhale 2 L into the lungs at bedtime. cpap machine    . PHENobarbital (LUMINAL) 32.4 MG tablet TAKE 2 TABLETS BY MOUTH TWICE A DAY 120 tablet 5  . spironolactone (ALDACTONE) 25 MG tablet TAKE 1 TABLET (25 MG TOTAL) BY MOUTH DAILY. 90 tablet 1  . torsemide (DEMADEX) 20 MG tablet Take 20 mg by  mouth 2 (two) times daily.    Marland Kitchen. warfarin (COUMADIN) 5 MG tablet Take 2 tablets (10 mg total) by mouth one time only at 6 PM. 100 tablet 1   No current facility-administered medications for this encounter.     Allergies  Allergen Reactions  . Aspirin Hives and Rash  . Penicillins Rash and Other (See Comments)    Amoxicillin also, Has patient had a PCN reaction causing immediate rash, facial/tongue/throat swelling, SOB or lightheadedness with hypotension: Yes Has patient had a PCN reaction causing severe rash involving mucus membranes or skin necrosis: No Has patient had a PCN reaction that required hospitalization No Has patient had a PCN reaction occurring within the last 10 years: Yes If all of the above answers are "NO", then may proceed with Cephalosporin use.   . Tape Other (See Comments)    NO PAPER TAPE-MUST BE MEDICAL TAPE  . Wool Alcohol [Lanolin] Hives  . Amoxicillin Rash  . Ampicillin Itching and Rash      Social History   Social History  . Marital status: Married    Spouse name: [name]  . Number of children: 4  . Years of education: 12   Occupational History  . disabled    Social History Main Topics  . Smoking status: Former Smoker    Packs/day: 1.00    Years: 7.00    Types: Cigarettes    Quit date: 03/16/1969  . Smokeless tobacco: Never Used  . Alcohol use No  . Drug use: No  . Sexual activity: Not Currently    Birth control/ protection: Post-menopausal   Other Topics Concern  . Not on file   Social History Narrative   Lives with husband Doristine LocksSteven   Son - Dorothey BasemanJames Allen lives with them as do his 2 children   Daughter, Steward DroneBrenda, and her 3 children live in [place]   Son -  [name] is incarcerated   Nephew, Clelia CroftDavid Beaumier lives with them   D[place], [place]    [name]   Moved June 2013 [address]      Lives with husband, son, grandson, granddaughter, nephew.      Family History    Problem Relation Age of Onset  . Stroke Father     died from CAD?  Marland Kitchen. Heart disease Father   . Cancer Mother   . Diabetes Son     Type 1  . Sleep apnea Son   . Cancer Sister     cervical  . Hypertension Sister   . Hypertension Brother   . Rheum arthritis Daughter  Also PGM, PGGM    Vitals:   10/18/16 1008  BP: 106/70  Pulse: 68  SpO2: 100%  Weight: 199 lb 4 oz (90.4 kg)   Wt Readings from Last 3 Encounters:  10/18/16 199 lb 4 oz (90.4 kg)  10/17/16 198 lb 12.8 oz (90.2 kg)  10/14/16 197 lb 9.6 oz (89.6 kg)     PHYSICAL EXAM: General: Sitting in chair.  HEENT: normal Neck: supple. JVP 7-8 Carotids 2+ bilat; no bruits. No thyromegaly or nodule noted.  Cor: PMI nonpalpable Irregular rate &rhythm. 2/6 TR Lungs: Diminished basilar sounds.  Abdomen: obese + abdominal hernial. Soft, NT, ND, no HSM. +BS  Extremities: no cyanosis, clubbing, rash, 1-2+ edema with some erythema Neuro: alert & orientedx3, cranial nerves grossly intact. moves all 4 extremities w/o difficulty. Affect pleasant  ECG: AF with v-pacing. No PVCs   ASSESSMENT & PLAN: 1. Chronic systolic HF due to NICM. ? PVC induced. Echo 10/17 EF 30-35% Cath 10/17 normal coronary arteries   --Still mildly volume overloaded. NYHA II-II   --Increase losartan to 25    --Increase torsemide 40/20 can go to 40/40 as needed.   --Continue spiro and digoxin   --No b-blocker yet due to marginal output   --Reinforced need for daily weights and reviewed use of sliding scale diuretics.   --Repeat echo in 2-3 months to see if EF recovers with PVC suppression. If remains < 35% need to consider CRT-D with chronic pacing  2. Frequent PVCs   --continue amio.   --ECG today with AF and v-pacing No PVCS 3. Obesity  4. HTN 5. HL 6. Chronic AF with tach brady s/p pacemaker    --Continue coumadin. Consider switch to DOAC in future.        Total time spent 45 minutes. Over half that time spent discussing above.   [name],  Daniel,MD 10:06 PM

## 2016-10-19 DIAGNOSIS — G4733 Obstructive sleep apnea (adult) (pediatric): Secondary | ICD-10-CM | POA: Diagnosis not present

## 2016-10-21 ENCOUNTER — Other Ambulatory Visit: Payer: Self-pay | Admitting: *Deleted

## 2016-10-21 NOTE — Patient Outreach (Signed)
[facility]) Care Management  10/21/2016  Misty Gray 09/11/1951 [id number]   Weekly transition of care call placed to member, no answer.  Unable to leave a voice message.  Will follow up within the next week.  Kemper Durie, [place], MSN [facility] Care Management  [facility] Manager [phone]

## 2016-10-22 ENCOUNTER — Telehealth (HOSPITAL_COMMUNITY): Payer: Self-pay | Admitting: Pharmacist

## 2016-10-22 DIAGNOSIS — I5022 Chronic systolic (congestive) heart failure: Secondary | ICD-10-CM

## 2016-10-22 MED ORDER — TORSEMIDE 20 MG PO TABS: 20.0000 mg | ORAL_TABLET | Freq: Two times a day (BID) | ORAL | 5 refills | Status: DC

## 2016-10-22 MED ORDER — LOSARTAN POTASSIUM 25 MG PO TABS: 12.5000 mg | ORAL_TABLET | Freq: Every day | ORAL | 5 refills | Status: DC

## 2016-10-22 NOTE — Telephone Encounter (Signed)
[facility] laboratory scientific officer) called stating Misty Gray's SBP was 78 mmHg today and she has been wobbly and tired over the past few days. Spoke with Dr. Gala Romney who recommended decreasing losartan back to 12.5 mg daily and torsemide back to 20 mg BID. We will schedule her for labs tomorrow.   [name]. Bonnye Fava, PharmD, BCPS, CPP Clinical Pharmacist Pager: [phone] Phone: [phone] 10/22/2016 3:30 PM

## 2016-10-23 ENCOUNTER — Ambulatory Visit (HOSPITAL_COMMUNITY)
Admission: RE | Admit: 2016-10-23 | Discharge: 2016-10-23 | Disposition: A | Discharge status: Home / Self Care | Payer: Medicare Other | Source: Ambulatory Visit | Attending: Internal Medicine | Admitting: Internal Medicine

## 2016-10-23 ENCOUNTER — Telehealth: Payer: Self-pay | Admitting: *Deleted

## 2016-10-23 ENCOUNTER — Ambulatory Visit (INDEPENDENT_AMBULATORY_CARE_PROVIDER_SITE_OTHER): Payer: Medicare Other | Admitting: *Deleted

## 2016-10-23 DIAGNOSIS — I5022 Chronic systolic (congestive) heart failure: Secondary | ICD-10-CM | POA: Insufficient documentation

## 2016-10-23 DIAGNOSIS — I4891 Unspecified atrial fibrillation: Secondary | ICD-10-CM

## 2016-10-23 LAB — POCT INR: INR: 5.5

## 2016-10-23 LAB — BASIC METABOLIC PANEL
Anion gap: 10 (ref 5–15)
BUN: 21 mg/dL — ABNORMAL HIGH (ref 6–20)
CALCIUM: 9.5 mg/dL (ref 8.9–10.3)
CO2: 28 mmol/L (ref 22–32)
CREATININE: 0.91 mg/dL (ref 0.44–1.00)
Chloride: 100 mmol/L — ABNORMAL LOW (ref 101–111)
GFR calc non Af Amer: >60 mL/min (ref >60)
Glucose, Bld: 106 mg/dL — ABNORMAL HIGH (ref 65–99)
Potassium: 3.7 mmol/L (ref 3.5–5.1)
SODIUM: 138 mmol/L (ref 135–145)

## 2016-10-23 LAB — DIGOXIN LEVEL: Digoxin Level: 1.3 ng/mL (ref 0.8–2.0)

## 2016-10-23 NOTE — Telephone Encounter (Signed)
Reached out to patient to learn how she is doing. Patient states she has been gaining weight. Home weight this AM was 203 lb. Office weight on 10/17/2016 was 198 lb. Patient denies [facility] at present. States she was [facility] yesterday walking into church. Patient has been taking 40 mg torsemide bid. This was reduced to 20 mg bid yesterday by cardiologist due to low SBP and patient feeling "wobbly". Patient states she is still losing her balance due to "two left feet." Encouraged patient several times to use walker that's in the home to decrease fall risk. Patient does not want to do this. Spoke with son, Fayrene Fearing, who feels she's walking better today after losartan was decreased to 12.5 mg yesterday by cardiologist. Patient using CPAP at night. Recently evaluated by [facility] to see if O2 sats were dropping at night. Son turned this information into [facility] [phone]) yesterday and was told it would be a couple of days before they learned results. Son also notes that patient's lower extremities are looking "fairly well" in terms of edema. Patient has ROV scheduled for 10/31/2016 with PCP. Note routed to PCP.   Kinnie Feil, RN, BSN

## 2016-10-24 ENCOUNTER — Other Ambulatory Visit (HOSPITAL_COMMUNITY): Payer: Self-pay | Admitting: Pharmacist

## 2016-10-24 ENCOUNTER — Other Ambulatory Visit: Payer: Self-pay | Admitting: Family Medicine

## 2016-10-24 MED ORDER — AMIODARONE HCL 200 MG PO TABS: 200.0000 mg | ORAL_TABLET | Freq: Two times a day (BID) | ORAL | 5 refills | Status: DC

## 2016-10-24 NOTE — Telephone Encounter (Signed)
RN note reviewed

## 2016-10-24 NOTE — Telephone Encounter (Signed)
Patient uses [facility] on [address], pharmacy changed in [place].

## 2016-10-24 NOTE — Telephone Encounter (Signed)
Pt called to change pharmacy to [facility]. She told Irving Burton a [employer] address and me [place]. Please contact pt to confirm which one

## 2016-10-24 NOTE — Telephone Encounter (Signed)
Pt needs a refill on amiodarone. Pt has also switched to [facility] on [address]. Please advise. Thanks! ep

## 2016-10-25 ENCOUNTER — Telehealth (HOSPITAL_COMMUNITY): Payer: Self-pay | Admitting: *Deleted

## 2016-10-25 ENCOUNTER — Other Ambulatory Visit (HOSPITAL_COMMUNITY): Payer: Medicare Other

## 2016-10-25 DIAGNOSIS — I872 Venous insufficiency (chronic) (peripheral): Secondary | ICD-10-CM | POA: Diagnosis not present

## 2016-10-25 DIAGNOSIS — I5021 Acute systolic (congestive) heart failure: Secondary | ICD-10-CM | POA: Diagnosis not present

## 2016-10-25 DIAGNOSIS — I110 Hypertensive heart disease with heart failure: Secondary | ICD-10-CM | POA: Diagnosis not present

## 2016-10-25 DIAGNOSIS — Z87891 Personal history of nicotine dependence: Secondary | ICD-10-CM | POA: Diagnosis not present

## 2016-10-25 DIAGNOSIS — G4733 Obstructive sleep apnea (adult) (pediatric): Secondary | ICD-10-CM | POA: Diagnosis not present

## 2016-10-25 DIAGNOSIS — I4891 Unspecified atrial fibrillation: Secondary | ICD-10-CM | POA: Diagnosis not present

## 2016-10-25 DIAGNOSIS — Z7901 Long term (current) use of anticoagulants: Secondary | ICD-10-CM | POA: Diagnosis not present

## 2016-10-25 DIAGNOSIS — Z9981 Dependence on supplemental oxygen: Secondary | ICD-10-CM | POA: Diagnosis not present

## 2016-10-25 MED ORDER — AMIODARONE HCL 200 MG PO TABS: 200.0000 mg | ORAL_TABLET | Freq: Two times a day (BID) | ORAL | 0 refills | Status: DC

## 2016-10-25 NOTE — Telephone Encounter (Signed)
Notes Recorded by Modesta Messing, CMA on 10/25/2016 at 4:23 PM EST Patient aware.  ------  Notes Recorded by Dolores Patty, MD on 10/23/2016 at 9:58 PM EST Stop digoxin    Ref Range & Units 2d ago 7d ago 3wk ago   Digoxin Level 0.8 - 2.0 ng/mL 1.3  1.1  0.4

## 2016-10-25 NOTE — Telephone Encounter (Signed)
-----   Message from Dolores Patty, MD sent at 10/23/2016  9:58 PM EST ----- Stop digoxin

## 2016-10-27 DIAGNOSIS — Z1212 Encounter for screening for malignant neoplasm of rectum: Secondary | ICD-10-CM | POA: Diagnosis not present

## 2016-10-27 DIAGNOSIS — Z1211 Encounter for screening for malignant neoplasm of colon: Secondary | ICD-10-CM | POA: Diagnosis not present

## 2016-10-28 ENCOUNTER — Other Ambulatory Visit: Payer: Self-pay | Admitting: *Deleted

## 2016-10-28 ENCOUNTER — Other Ambulatory Visit: Payer: Self-pay | Admitting: Family Medicine

## 2016-10-28 ENCOUNTER — Telehealth: Payer: Self-pay | Admitting: Family Medicine

## 2016-10-28 DIAGNOSIS — G4733 Obstructive sleep apnea (adult) (pediatric): Secondary | ICD-10-CM

## 2016-10-28 NOTE — Patient Outreach (Signed)
[facility]) Care Management  10/28/2016  Misty Gray 1951-02-10 [id number]   Weekly transition of care call placed to member.  Identity verified.  She report that she is "doing alright," stating that she feel she is back to her baseline.  She report her weight today being 194 pounds, which is down from her last office visit.  She state that her legs still have some swelling, but state it is better.  She denies having leg wraps on currently.  She is aware of her office visits with her primary MD and heart failure clinic this week.  She is unable to discuss current status of oxygen testing, but she denies that she has home O2 at this time.  He son is not available to provide more details.  She denies any shortness of breath or chest discomfort.  She agrees to home visit later this month, will follow up on MD visits next week.  Advised to contact with concerns.  Kemper Durie, [place], MSN [facility] Care Management  [facility] Manager [phone]

## 2016-10-28 NOTE — Telephone Encounter (Signed)
RN staff - please call skilled nursing and give orders to extend skilled nursing.

## 2016-10-28 NOTE — Telephone Encounter (Signed)
Misty Gray would like to extend skilled nursing visits for twice a week for one week and once a week for two weeks. Please advise. Thanks! ep

## 2016-10-29 ENCOUNTER — Other Ambulatory Visit: Payer: Self-pay | Admitting: Family Medicine

## 2016-10-29 DIAGNOSIS — I519 Heart disease, unspecified: Secondary | ICD-10-CM

## 2016-10-29 NOTE — Telephone Encounter (Signed)
Verbal orders given to Lisa. 

## 2016-10-29 NOTE — Telephone Encounter (Signed)
Called number provided, no answer, mailbox full unable to leave message. Will try again later.

## 2016-10-30 ENCOUNTER — Telehealth: Payer: Self-pay | Admitting: Family Medicine

## 2016-10-30 NOTE — Telephone Encounter (Signed)
Reached out to [employer] [phone]) at [place] regarding information below. States patient is without oxygen at this time. [facility] is ready to provide set-up, however, patient is wanting to wait till next month when she may be able to afford it. Unfortunately, [facility] won't be able to provide oxygen at that time due to too much time elapsing after oxygen saturation study. As noted below [facility] will require patient to go to sleep lab for titration prior to initiating oxygen therapy. PCP notified. Patient has appt tomorrow with PCP for TOC 2 week f/u and will discuss this at that time.  Kinnie Feil, RN, BSN

## 2016-10-30 NOTE — Progress Notes (Signed)
   Subjective:    Patient ID: Misty Gray, female    DOB: 30-Sep-1951, 65 y.o.   MRN: [id number]  HPI 65 y/o female presents for routine follow up.   Seen on 10/17/16 by Dr. Leveda Anna for hospital follow up of CHF Exacerbation. Reviewed office note. Seen on 10/18/16 by Dr. Gala Romney (Cardiology). Reviewed office note. Torsemide changed to 40 mg in AM and 20 mg in PM. Losartan started during recent hospitalization at 25 mg daily (currentl script written for 1/2 tablet daily). Spironolactone and Digoxin continued. Metoprolol stopped during hospitalization due to hypotension. Amiodarone started during hospitalization for Afib.   Systolic CHF (EF 37-10%) Home weights gradually rising, last home weight on 11/10 was 204. Has been taking Torsemide 20 mg BID.    Venous Insufficiency Swelling is improved after few weeks of Unna boots, mild drainage occasionally  Atrial Fibrillation. On Coumadin, Amiodarone, and Digoxin (has not been taking as home RN told her not to, name is [name] from [facility], [phone], per notes Dig level 1.3 and told to stop Digoxin). Seen by lab today. INR elevated (see below).   Hypertension Amiodarone, Losartan, Spironolactone Blood pressure low at home, no lightheadedness, no chest pain, no increased sob  Review of Systems  Constitutional: Negative for chills, fatigue and fever.  Respiratory: Negative for cough and shortness of breath.   Cardiovascular: Positive for leg swelling. Negative for chest pain.  Gastrointestinal: Negative for diarrhea and vomiting.       Objective:   Physical Exam BP (!) 78/54   Pulse 71   Temp 97.8 F (36.6 C) (Oral)   Ht 5\' 2"  (1.575 m)   Wt 198 lb (89.8 kg)   BMI 36.21 kg/m  Discharge weight was 197 pounds on 10/23. Weight today is 198.  Gen: pleasant female, NAD Cardiac: RRR, S1 and S2 present, no murmurs Resp: CTAB, normal effort Ext: bilateral 1+ edema, slight erythema of bilateral legs presents, mild  drainage/weeping   Reviewed BMP and Digoxin Level from 11/8 PCO INR 7.4    Assessment & Plan:  Chronic anticoagulation INR elevated today to 7.4. No active bleeding. -Hold for 2 days, then decrease dose to 5 mg daily -recheck INR in 3-5 days.   Persistent atrial fibrillation (HCC) Currently in NSR -continue Amiodarone and Coumadin -Digoxin recently stopped by Cardiology due to elevated Digoxin level  Chronic systolic dysfunction of left ventricle Patient currently at dry weight.  -continue torsemide 20 mg daily -continue Losartan 12.5 mg daily -stop Spironolactone due to hypotension -off BB due to hypotension -off Digoxin due to elevated Digoxin level (patient has upcoming follow up with Cardiology)  Hypotension Patient hypotensive however asymptomatic. -stop Spironoloactone  Chronic venous insufficiency Edema relatively well controlled.  -continue diuresis -no need for Unna boots at this time  Sleep apnea Patient recently qualified for home Oxygen after Home O2 evaluation. She is unable to pay the $20 co-pay. -will provided $20 from [facility] Indigent fund.

## 2016-10-30 NOTE — Telephone Encounter (Signed)
Melissa from [facility] called because the patient needs oxygen and doesn't want this because of the 20.00 co-pay. The other issue is going to be at the first of the year when the patient insurance goes to [facility]. She will have to have a sleep study with titration before they will approve any oxygen orders. Please discuss with patient her options and please call Melissa if you have any questions. jw

## 2016-10-30 NOTE — Telephone Encounter (Signed)
RN note reviewed

## 2016-10-30 NOTE — Telephone Encounter (Signed)
Melissa from [facility] called and is concerned about the oxygen for the patient. The patient doesn't want to pay a 20.00 a month copay. Also at the first of the year the patient insurance is changing to [facility] and they will requeire a sleep study with titration before the will pay for oxygen. jw

## 2016-10-31 ENCOUNTER — Ambulatory Visit (INDEPENDENT_AMBULATORY_CARE_PROVIDER_SITE_OTHER): Payer: Medicare Other | Admitting: Family Medicine

## 2016-10-31 ENCOUNTER — Ambulatory Visit (INDEPENDENT_AMBULATORY_CARE_PROVIDER_SITE_OTHER): Payer: Medicare Other | Admitting: *Deleted

## 2016-10-31 ENCOUNTER — Telehealth: Payer: Self-pay | Admitting: *Deleted

## 2016-10-31 ENCOUNTER — Encounter: Payer: Self-pay | Admitting: Licensed Clinical Social Worker

## 2016-10-31 ENCOUNTER — Encounter: Payer: Self-pay | Admitting: Family Medicine

## 2016-10-31 VITALS — BP 78/54 | HR 71 | Temp 97.8°F | Ht 62.0 in | Wt 198.0 lb

## 2016-10-31 DIAGNOSIS — I4819 Other persistent atrial fibrillation: Secondary | ICD-10-CM

## 2016-10-31 DIAGNOSIS — I4891 Unspecified atrial fibrillation: Secondary | ICD-10-CM

## 2016-10-31 DIAGNOSIS — I519 Heart disease, unspecified: Secondary | ICD-10-CM

## 2016-10-31 DIAGNOSIS — Z7901 Long term (current) use of anticoagulants: Secondary | ICD-10-CM | POA: Diagnosis not present

## 2016-10-31 DIAGNOSIS — G4733 Obstructive sleep apnea (adult) (pediatric): Secondary | ICD-10-CM

## 2016-10-31 DIAGNOSIS — I509 Heart failure, unspecified: Secondary | ICD-10-CM | POA: Diagnosis not present

## 2016-10-31 DIAGNOSIS — I959 Hypotension, unspecified: Secondary | ICD-10-CM

## 2016-10-31 DIAGNOSIS — I481 Persistent atrial fibrillation: Secondary | ICD-10-CM

## 2016-10-31 DIAGNOSIS — R0902 Hypoxemia: Secondary | ICD-10-CM | POA: Diagnosis not present

## 2016-10-31 DIAGNOSIS — L304 Erythema intertrigo: Secondary | ICD-10-CM | POA: Diagnosis not present

## 2016-10-31 DIAGNOSIS — I872 Venous insufficiency (chronic) (peripheral): Secondary | ICD-10-CM

## 2016-10-31 LAB — PROTIME-INR
INR: 6.59 — AB
PROTHROMBIN TIME: 59.7 s — AB (ref 11.4–15.2)

## 2016-10-31 LAB — POCT INR: INR: 7.4

## 2016-10-31 MED ORDER — NYSTATIN 100000 UNIT/GM EX POWD: Freq: Four times a day (QID) | CUTANEOUS | 1 refills | Status: DC

## 2016-10-31 NOTE — Assessment & Plan Note (Signed)
Edema relatively well controlled.  -continue diuresis -no need for Unna boots at this time

## 2016-10-31 NOTE — Assessment & Plan Note (Signed)
INR elevated today to 7.4. No active bleeding. -Hold for 2 days, then decrease dose to 5 mg daily -recheck INR in 3-5 days.

## 2016-10-31 NOTE — Assessment & Plan Note (Signed)
Patient hypotensive however asymptomatic. -stop Spironoloactone

## 2016-10-31 NOTE — Assessment & Plan Note (Addendum)
Currently in NSR -continue Amiodarone and Coumadin -Digoxin recently stopped by Cardiology due to elevated Digoxin level

## 2016-10-31 NOTE — Patient Instructions (Addendum)
It was nice to see you today.  Coumadin - stop for 2 days, restart on 11/18 take 5 mg daily.    Torsemide - continue taking 20 mg twice daily.  Spironolactone - stop this medication due to your blood pressure being so low  I will touch base with Katie from [facility] to see who recommended stopping the Digoxin. I may call you to restart this medication.   [facility] and arrange for oxygen.

## 2016-10-31 NOTE — Telephone Encounter (Signed)
Received a call from [facility] with critical lab value of INR 6.59.  PCP is aware and changes were made per PCP.  Clovis Pu, RN

## 2016-10-31 NOTE — Assessment & Plan Note (Signed)
Patient recently qualified for home Oxygen after Home O2 evaluation. She is unable to pay the $20 co-pay. -will provided $20 from [facility] Indigent fund.

## 2016-10-31 NOTE — Assessment & Plan Note (Signed)
Patient currently at dry weight.  -continue torsemide 20 mg daily -continue Losartan 12.5 mg daily -stop Spironolactone due to hypotension -off BB due to hypotension -off Digoxin due to elevated Digoxin level (patient has upcoming follow up with Cardiology)

## 2016-10-31 NOTE — Progress Notes (Signed)
Social work consult from Dr. Ree Kida to assist patient with obtaining resources for oxygen co-pay. Call to [facility] with [place].  O2 has been set up for patient, [facility] will call patient's son Jeneen Rinks to arrange delivery time and day.  LCSW met with patient and son to provide an update.  [facility] will assist patient with first co-payment from pretty cash fund.  Patient appreciative of the assistance and states she is able to make ongoing $20.00 monthly co-pay.  LCSW informed [facility] that [facility] would assist patient with $20.00 co-pay for first month.    Plan: $20.00 will be delivered to [place] on [address].  Account # [id number].  Casimer Lanius, LCSW Licensed Clinical Social Worker [facility]   [phone] 4:28 PM

## 2016-11-01 ENCOUNTER — Ambulatory Visit (HOSPITAL_COMMUNITY)
Admission: RE | Admit: 2016-11-01 | Discharge: 2016-11-01 | Disposition: A | Discharge status: Home / Self Care | Payer: Medicare Other | Source: Ambulatory Visit | Attending: Cardiology | Admitting: Cardiology

## 2016-11-01 VITALS — BP 116/78 | HR 62 | Wt 201.8 lb

## 2016-11-01 DIAGNOSIS — Z79899 Other long term (current) drug therapy: Secondary | ICD-10-CM | POA: Insufficient documentation

## 2016-11-01 DIAGNOSIS — Z950 Presence of cardiac pacemaker: Secondary | ICD-10-CM | POA: Diagnosis not present

## 2016-11-01 DIAGNOSIS — Z0000 Encounter for general adult medical examination without abnormal findings: Secondary | ICD-10-CM | POA: Insufficient documentation

## 2016-11-01 DIAGNOSIS — I519 Heart disease, unspecified: Secondary | ICD-10-CM

## 2016-11-01 DIAGNOSIS — Z880 Allergy status to penicillin: Secondary | ICD-10-CM | POA: Insufficient documentation

## 2016-11-01 DIAGNOSIS — G4733 Obstructive sleep apnea (adult) (pediatric): Secondary | ICD-10-CM | POA: Diagnosis not present

## 2016-11-01 DIAGNOSIS — I10 Essential (primary) hypertension: Secondary | ICD-10-CM

## 2016-11-01 DIAGNOSIS — I482 Chronic atrial fibrillation, unspecified: Secondary | ICD-10-CM | POA: Insufficient documentation

## 2016-11-01 DIAGNOSIS — I5023 Acute on chronic systolic (congestive) heart failure: Secondary | ICD-10-CM | POA: Diagnosis not present

## 2016-11-01 DIAGNOSIS — I5022 Chronic systolic (congestive) heart failure: Secondary | ICD-10-CM | POA: Insufficient documentation

## 2016-11-01 DIAGNOSIS — I110 Hypertensive heart disease with heart failure: Secondary | ICD-10-CM | POA: Diagnosis not present

## 2016-11-01 DIAGNOSIS — Z87891 Personal history of nicotine dependence: Secondary | ICD-10-CM | POA: Insufficient documentation

## 2016-11-01 DIAGNOSIS — I429 Cardiomyopathy, unspecified: Secondary | ICD-10-CM | POA: Diagnosis not present

## 2016-11-01 DIAGNOSIS — Z7901 Long term (current) use of anticoagulants: Secondary | ICD-10-CM

## 2016-11-01 MED ORDER — SPIRONOLACTONE 25 MG PO TABS: 12.5000 mg | ORAL_TABLET | Freq: Every day | ORAL | 3 refills | Status: DC

## 2016-11-01 NOTE — Patient Instructions (Signed)
START Spironolactone 12.5 mg, one half tab daily  Please be sure to have labs drawn on Monday  Your physician recommends that you schedule a follow-up appointment in: 4 weeks with Joanell Rising  Do the following things EVERYDAY: 1) Weigh yourself in the morning before breakfast. Write it down and keep it in a log. 2) Take your medicines as prescribed 3) Eat low salt foods-Limit salt (sodium) to 2000 mg per day.  4) Stay as active as you can everyday 5) Limit all fluids for the day to less than 2 liters

## 2016-11-01 NOTE — Progress Notes (Signed)
Follow up call to patient's son Fayrene Fearing. [phone]. Confirmed [facility] delivered patient's O2 yesterday.   Sammuel Hines, LCSW Licensed Clinical Social Worker [facility]   [phone] 1:41 PM

## 2016-11-01 NOTE — Progress Notes (Signed)
ADVANCED HF CLINIC NOTE   HPI:  65 y/o woman with history of morbid obesity, OSA, chronic atrial fibrillation with tachy-brady syndrome s/p PPM, frequent PVCs, systolic HF due to NICM.  Recently admitted for ADHF in 10/17. Cath with normal coronary arteries and elevated filling pressures. Echo 10/17 EF 30-35% (previously 35-40%). 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 (She has 81% RV pacing and 15-20 PVCs per minute). Started on po amio with suppression of PVCs. Diuresed 41 pounds. Discharge weight 197.   Here today for regular follow up. Weight at home had come down but now 199 again, usually closer to 195. Have had to adjust all of her medications with recent lightheadedness and low blood pressure.  INR 7.4 yesterday. Holding coumadin yesterday and today.  Getting recheck 11/04/16. Denies bleeding, no BRBPR or melena. Breathing has been OK, improved a little but "not a whole lot". Is OK as long as she takes her time.   R/LHC 10/03/16 Ao = 94/58 (72) LV = 104/20 RA = 21 RV = 65/4/19 PA = 65/21 (38) PCW = 23 Fick cardiac output/index = 6.1/2.9 PVR = 2.5 WU FA sat = 97%  PA sat = 69%, 70%  Assessment: 1. Normal coronary arteries 2. NICM with EF 30-35% by echo 3. Persistently elevated biventricular pressures    Past Medical History:  Diagnosis Date  . Abnormal CT of the head 12/16/1984   R parietal atrophy  . Allergic rhinitis, cause unspecified   . Anemia   . Arthritis    "hands and knees" (09/06/2015)  . Atrial fibrillation (HCC)   . Cellulitis and abscess of leg 09/2016   bilateral  . CHF (congestive heart failure) (HCC)   . Diaphragmatic hernia without mention of obstruction or gangrene   . Dyspnea   . Exertional dyspnea 06/22/12  . Family history of adverse reaction to anesthesia    "daughter fights w/them; just can't relax during OR; stay awake during the OR"  . Female stress incontinence   . GERD (gastroesophageal reflux disease)   . Grand mal seizure (HCC)     . H/O hiatal hernia    two removed  . Heart murmur   . High cholesterol   . History of blood transfusion 1956   "related to nose bleed"  . Hypertension   . Influenza A 01/11/2014  . Lichenification and lichen simplex chronicus   . Migraine    "when I was a teenager"  . Morbid obesity (HCC)   . Nonischemic cardiomyopathy (HCC)    EF has normalized-repeat Pending   . On home oxygen therapy    "2L when I'm asleep in bed" (09/06/2015)  . OSA on CPAP   . Pacemaker  st judes    Patient states "this is my third pacemaker"  . Pneumonia 2004; 2011; 11/2011  . Seizures (HCC) since 1981   "can hear you talking but sounds like you are in big tunnel; have them often if not taking RX; last one was 06/17/12" (09/06/2015)  . Shortness of breath 10/24/2010   Qualifier: Diagnosis of  By: [place], Bonnie    . Sick sinus syndrome (HCC)    WITH PRIOR DDD PACEMAKER IMPLANTATION  . Stroke [facility]) 1988   "mouth drawed real bad on my left side; found out it was a seizure"  . Syncope and collapse 06/22/12   "hit forehead and left knee"; denies loss of consciousness  . Unspecified venous (peripheral) insufficiency     Current Outpatient Prescriptions  Medication Sig Dispense  Refill  . amiodarone (PACERONE) 200 MG tablet Take 1 tablet (200 mg total) by mouth 2 (two) times daily. 60 tablet 5  . CVS VITAMIN D3 1000 units capsule TAKE ONE CAPSULE BY MOUTH EVERY MORNING (Patient taking differently: TAKE ONE CAPSULE (1000 units) BY MOUTH EVERY MORNING) 90 capsule 1  . fluticasone (FLONASE) 50 MCG/ACT nasal spray Place 2 sprays into both nostrils daily as needed. For congestion. 16 g 1  . losartan (COZAAR) 25 MG tablet Take 0.5 tablets (12.5 mg total) by mouth daily. 15 tablet 5  . nystatin (MYCOSTATIN/NYSTOP) powder Apply topically 4 (four) times daily. 45 g 1  . omeprazole (PRILOSEC) 20 MG capsule TAKE ONE CAPSULE BY MOUTH EVERY DAY 90 capsule 1  . OXYGEN Inhale 2 L into the lungs at bedtime. cpap machine    .  PHENobarbital (LUMINAL) 32.4 MG tablet TAKE 2 TABLETS BY MOUTH TWICE A DAY 120 tablet 5  . torsemide (DEMADEX) 20 MG tablet Take 1 tablet (20 mg total) by mouth 2 (two) times daily. 60 tablet 5  . warfarin (COUMADIN) 5 MG tablet Take 2 tablets (10 mg total) by mouth one time only at 6 PM. 100 tablet 1   No current facility-administered medications for this encounter.     Allergies  Allergen Reactions  . Aspirin Hives and Rash  . Penicillins Rash and Other (See Comments)    Amoxicillin also, Has patient had a PCN reaction causing immediate rash, facial/tongue/throat swelling, SOB or lightheadedness with hypotension: Yes Has patient had a PCN reaction causing severe rash involving mucus membranes or skin necrosis: No Has patient had a PCN reaction that required hospitalization No Has patient had a PCN reaction occurring within the last 10 years: Yes If all of the above answers are "NO", then may proceed with Cephalosporin use.   . Tape Other (See Comments)    NO PAPER TAPE-MUST BE MEDICAL TAPE  . Wool Alcohol [Lanolin] Hives  . Amoxicillin Rash  . Ampicillin Itching and Rash      Social History   Social History  . Marital status: Married    Spouse name: [name]  . Number of children: 4  . Years of education: 12   Occupational History  . disabled    Social History Main Topics  . Smoking status: Former Smoker    Packs/day: 1.00    Years: 7.00    Types: Cigarettes    Quit date: 03/16/1969  . Smokeless tobacco: Never Used  . Alcohol use No  . Drug use: No  . Sexual activity: Not Currently    Birth control/ protection: Post-menopausal   Other Topics Concern  . Not on file   Social History Narrative   Lives with husband Doristine LocksSteven   Son - Dorothey BasemanJames Allen lives with them as do his 2 children   Daughter, Steward DroneBrenda, and her 3 children live [place]   Son -  [name] is incarcerated   Nephew, Clelia CroftDavid Galloway lives with them   [place]    [name] [employer]   Moved June [address]      Lives with husband, son, grandson, granddaughter, nephew.      Family History  Problem Relation Age of Onset  . Stroke Father     died from CAD?  Marland Kitchen. Heart disease Father   . Cancer Mother   . Diabetes Son     Type 1  . Sleep apnea Son   .  Cancer Sister     cervical  . Hypertension Sister   . Hypertension Brother   . Rheum arthritis Daughter     Also PGM, PGGM    Vitals:   11/01/16 1050  BP: 116/78  Pulse: 62  Weight: 201 lb 12.8 oz (91.5 kg)   Wt Readings from Last 3 Encounters:  11/01/16 201 lb 12.8 oz (91.5 kg)  10/31/16 198 lb (89.8 kg)  10/18/16 199 lb 4 oz (90.4 kg)    PHYSICAL EXAM: General: Sitting in chair.  HEENT: normal Neck: supple. JVP 7-8 Carotids 2+ bilat; no bruits. No thyromegaly or nodule noted.  Cor: PMI nonpalpable Irregular rate &rhythm. 2/6 TR Lungs: Diminished basilar sounds.  Abdomen: obese + abdominal hernial. Soft, NT, ND, no HSM. +BS  Extremities: no cyanosis, clubbing, rash, Chronic 1+ edema with mild erythema.  Neuro: alert & orientedx3, cranial nerves grossly intact. moves all 4 extremities w/o difficulty. Affect pleasant  ASSESSMENT & PLAN: 1. Chronic systolic HF due to NICM. ? PVC induced. Echo 10/17 EF 30-35% Cath 10/17 normal coronary arteries - NYHA II-III. Chronic peripheral edema and JVP mildly elevated.  - Continue losartan 12.5 mg daily. Intolerant to up-titration with hypotension.  - Continue torsemide 20 mg BID.  - Resume spiro at decreased dose of 12.5 mg daily for mild diuretic effect.  - Off digoxin with elevated levels at last visit.  - No b-blocker yet due to marginal output - Reinforced need for daily weights and reviewed use of sliding scale diuretics. - Repeat echo in 2-3 months to see if EF recovers with PVC suppression. If remains < 35% need to consider CRT-D with chronic pacing 2. Frequent PVCs - Continue amio. 3. Obesity  - Needs to  increase activity and limit portion side.  4. HTN - Meds as above. Has been low recently.  - Gentle up-titration. 5. HL 6. Chronic AF with tach brady s/p pacemaker - Continue coumadin. Consider switch to DOAC in future.  - Having difficult managing her INR.  Should consider switch to DOAC.       Gently increasing meds as above with hypotension and mild volume overload. Can take addition 20 mg torsemide as needed. BMET today. Follow up 4 weeks.   Graciella Freer, PA-C 10:57 AM  Total time spent > 25 minutes. Over half that spent discussing the above.

## 2016-11-04 ENCOUNTER — Ambulatory Visit: Payer: Medicare Other | Admitting: *Deleted

## 2016-11-04 ENCOUNTER — Ambulatory Visit (INDEPENDENT_AMBULATORY_CARE_PROVIDER_SITE_OTHER): Payer: Medicare Other | Admitting: *Deleted

## 2016-11-04 ENCOUNTER — Other Ambulatory Visit: Payer: Self-pay

## 2016-11-04 ENCOUNTER — Telehealth: Payer: Self-pay | Admitting: Gastroenterology

## 2016-11-04 DIAGNOSIS — I872 Venous insufficiency (chronic) (peripheral): Secondary | ICD-10-CM | POA: Diagnosis not present

## 2016-11-04 DIAGNOSIS — G4733 Obstructive sleep apnea (adult) (pediatric): Secondary | ICD-10-CM | POA: Diagnosis not present

## 2016-11-04 DIAGNOSIS — Z87891 Personal history of nicotine dependence: Secondary | ICD-10-CM | POA: Diagnosis not present

## 2016-11-04 DIAGNOSIS — I4891 Unspecified atrial fibrillation: Secondary | ICD-10-CM

## 2016-11-04 DIAGNOSIS — Z9981 Dependence on supplemental oxygen: Secondary | ICD-10-CM | POA: Diagnosis not present

## 2016-11-04 DIAGNOSIS — I5021 Acute systolic (congestive) heart failure: Secondary | ICD-10-CM | POA: Diagnosis not present

## 2016-11-04 DIAGNOSIS — I110 Hypertensive heart disease with heart failure: Secondary | ICD-10-CM | POA: Diagnosis not present

## 2016-11-04 DIAGNOSIS — Z7901 Long term (current) use of anticoagulants: Secondary | ICD-10-CM | POA: Diagnosis not present

## 2016-11-04 LAB — COLOGUARD: COLOGUARD: POSITIVE

## 2016-11-04 LAB — POCT INR: INR: 4.1

## 2016-11-04 NOTE — Telephone Encounter (Signed)
cologuard has been recieved

## 2016-11-06 ENCOUNTER — Telehealth (HOSPITAL_COMMUNITY): Payer: Self-pay | Admitting: Cardiology

## 2016-11-06 ENCOUNTER — Other Ambulatory Visit: Payer: Self-pay | Admitting: *Deleted

## 2016-11-06 NOTE — Telephone Encounter (Signed)
Misty Gray with [place] [facility] paramed[facility] called during patients home visit. Reports 7 lb weight gain in a week ( weight today 208 weight last week 201) Patient asymptomatic SBP 84  Pt is not adhering to heart healthy diet and missed a couple of diuretic doses.   Reviewed above with Joanell Rising and no medication changes necessary. Restart medications as prescribed and please follow a healthy heart/low sodium diet.   Misty Gray aware

## 2016-11-06 NOTE — Patient Outreach (Signed)
[facility]) Care Management  11/06/2016  Misty Gray Feb 27, 1951 [id number]   Weekly transition of care call placed to member.  Son state that the member does not have a lot of time to talk today due to currently having a visit from a nurse.  He places member on phone to obtain call back information.  As this care manager was providing info, noted that visitor was K. Lynch, paramedicine.  Spoke to Ms. Lynch concerning member's current status.  She state that the member has swelling in her legs/feet and her weight has increased 7 pounds in a week.  Call already placed to MD office for further advice.  Member aware of routine home visit next week.  Will follow up on weight gain and health status at that time.  Kemper Durie, [place], MSN [facility] Care Management  [facility] Manager [phone]

## 2016-11-08 ENCOUNTER — Other Ambulatory Visit: Payer: Self-pay | Admitting: *Deleted

## 2016-11-08 NOTE — Patient Outreach (Signed)
[facility]) Care Management  11/08/2016  TEARSA SOCK August 05, 1951 [id number]   Call placed to member to reschedule home visit due to scheduling conflict.  Home visit will remain next week, but on Tuesday instead.  Kemper Durie, [place], MSN [facility] Care Management  [facility] Manager [phone]

## 2016-11-11 ENCOUNTER — Ambulatory Visit (INDEPENDENT_AMBULATORY_CARE_PROVIDER_SITE_OTHER): Payer: Medicare Other | Admitting: *Deleted

## 2016-11-11 ENCOUNTER — Ambulatory Visit: Payer: Medicare Other | Admitting: *Deleted

## 2016-11-11 ENCOUNTER — Ambulatory Visit: Payer: Self-pay | Admitting: *Deleted

## 2016-11-11 ENCOUNTER — Ambulatory Visit (INDEPENDENT_AMBULATORY_CARE_PROVIDER_SITE_OTHER): Payer: Medicare Other | Admitting: Family Medicine

## 2016-11-11 DIAGNOSIS — I4891 Unspecified atrial fibrillation: Secondary | ICD-10-CM

## 2016-11-11 DIAGNOSIS — I959 Hypotension, unspecified: Secondary | ICD-10-CM | POA: Diagnosis not present

## 2016-11-11 DIAGNOSIS — L03115 Cellulitis of right lower limb: Secondary | ICD-10-CM | POA: Diagnosis not present

## 2016-11-11 DIAGNOSIS — I5021 Acute systolic (congestive) heart failure: Secondary | ICD-10-CM | POA: Diagnosis not present

## 2016-11-11 DIAGNOSIS — G4733 Obstructive sleep apnea (adult) (pediatric): Secondary | ICD-10-CM | POA: Diagnosis not present

## 2016-11-11 DIAGNOSIS — I872 Venous insufficiency (chronic) (peripheral): Secondary | ICD-10-CM | POA: Diagnosis not present

## 2016-11-11 DIAGNOSIS — Z7901 Long term (current) use of anticoagulants: Secondary | ICD-10-CM | POA: Diagnosis not present

## 2016-11-11 DIAGNOSIS — I110 Hypertensive heart disease with heart failure: Secondary | ICD-10-CM | POA: Diagnosis not present

## 2016-11-11 DIAGNOSIS — Z87891 Personal history of nicotine dependence: Secondary | ICD-10-CM | POA: Diagnosis not present

## 2016-11-11 DIAGNOSIS — L03116 Cellulitis of left lower limb: Secondary | ICD-10-CM

## 2016-11-11 DIAGNOSIS — Z9981 Dependence on supplemental oxygen: Secondary | ICD-10-CM | POA: Diagnosis not present

## 2016-11-11 NOTE — Assessment & Plan Note (Signed)
Seen with Dr. Randolm Idol who knows her well. Swelling worse, along with increased weight over the last few days. 3+ pitting edema to upper shin. Unna boots applied bilaterally.

## 2016-11-11 NOTE — Patient Instructions (Signed)
It was a pleasure to see you today! I'm sorry you're feeling dizzy. For your swelling, we have placed Unna boots on both sides. Follow-up with Dr. Manya Silvas on Thursday.

## 2016-11-11 NOTE — Assessment & Plan Note (Signed)
Orthostatics negative. Repeat manual BP on my check was 90/60. This is not a new problem. Will continue current medication regimen, although could consider discontinuing spironolactone and increasing torsemide instead. Gait normal, no dizziness on standing or walking.

## 2016-11-11 NOTE — Progress Notes (Signed)
   CC: dizziness and hypotension in nurse visit  HPI Dizzy since Friday - feels more unsteady on her feet than room spinning or passing out. "Felt drunk" on Sunday with her dizziness while walking across a large hilly parking lot from church. Had to grab onto a stop sign because she felt her legs might give out. Has trouble walking, feeling like she can't find where to put her foot. Was feeling mildly short of breath at the time, but improved from previous experiences. No fevers, chills.  No sick contacts. PO fluid intake restricted to 2L per day, patient and son are strict with this. Eats a lot of cranberries. Weight at home - today 210, normally 201-202. Has recently been taking spironolactone - was taken off with Dr. Randolm Idol, but added back by HF clinic.   CC, SH/smoking status, and VS noted  Objective: Temp 97.8 F (36.6 C) (Oral)   Wt 210 lb (95.3 kg)   SpO2 96%   BMI 38.41 kg/m  Gen: NAD, alert, cooperative, and pleasant. HEENT: NCAT, EOMI, PERRL CV: RRR, no murmur Resp: CTAB, no wheezes, non-labored Abd: SNTND, BS present, no guarding or organomegaly Ext: 3+ pitting edema, warm. Redness to bilateral knees, no warmth or exudate. Healing escar over Superior aspect of LL malleolus. 1+ pulses bilaterally. Neuro: Alert and oriented, Speech clear, No gross deficits. Strength and sensation intact in all extremities. FTN normal, Negative Rhomberg, negative pronator drift.   Assessment and plan:  Hypotension Orthostatics negative. Repeat manual BP on my check was 90/60. This is not a new problem. Will continue current medication regimen, although could consider discontinuing spironolactone and increasing torsemide instead. Gait normal, no dizziness on standing or walking.   Chronic venous insufficiency Seen with Dr. Randolm Idol who knows her well. Swelling worse, along with increased weight over the last few days. 3+ pitting edema to upper shin. Unna boots applied bilaterally.   Dizziness No  longer present, was an isolated episode yesterday while walking a long distance. Probably related to deconditioning, and was described more as unsteadiness on her feet. Normal neuro exam. RTC or go to ED if worsening.  Loni Muse, MD, PGY1 11/11/2016 3:52 PM

## 2016-11-12 ENCOUNTER — Other Ambulatory Visit: Payer: Self-pay | Admitting: *Deleted

## 2016-11-12 ENCOUNTER — Ambulatory Visit (INDEPENDENT_AMBULATORY_CARE_PROVIDER_SITE_OTHER): Payer: Medicare Other | Admitting: *Deleted

## 2016-11-12 ENCOUNTER — Telehealth: Payer: Self-pay | Admitting: Family Medicine

## 2016-11-12 DIAGNOSIS — I495 Sick sinus syndrome: Secondary | ICD-10-CM

## 2016-11-12 LAB — POCT INR: INR: 2.5

## 2016-11-12 NOTE — Telephone Encounter (Signed)
[facility] is considering discharging pt.  Does she need continuation of wraps to lower extremity?  Her bp was low. It was 90/52. She was not complain on dizzy ness or light headed ness. Please advise

## 2016-11-12 NOTE — Patient Outreach (Signed)
[facility]) Care Management   11/12/2016  Misty Gray [birth year] [id number]  Misty Gray is an 65 y.o. female  Subjective:   Member report that she has been "ok" over the past few weeks.  She confirms that she attended a follow up appointment with her primary MD office last week.  Her legs were wrapped at that time, remain wrapped today.  She state that she weighed 210 pounds yesterday, today she is 204 pounds.  She denies shortness of breath, but does complain of some intermittent dizziness with her low blood pressure.  She denies dizziness at this time.  She report taking all medications as prescribed with the assistance of her son.  Member report that she now has her home O2, only using it at night.  Objective:   Review of Systems  Constitutional: Negative.   HENT: Negative.   Eyes: Negative.   Respiratory: Negative.   Cardiovascular: Positive for leg swelling.  Gastrointestinal: Negative.   Genitourinary: Negative.   Musculoskeletal: Negative.   Skin: Negative.   Neurological: Negative.   Endo/Heme/Allergies: Negative.   Psychiatric/Behavioral: Negative.     Physical Exam  Constitutional: She is oriented to person, place, and time. She appears well-developed and well-nourished.  Neck: Normal range of motion.  Cardiovascular: Normal rate, regular rhythm and normal heart sounds.   Respiratory: Effort normal and breath sounds normal.  GI: Soft. Bowel sounds are normal.  Musculoskeletal: Normal range of motion.  Neurological: She is alert and oriented to person, place, and time.  Skin: Skin is warm and dry.   BP (!) 82/48 (BP Location: Right Arm, Patient Position: Sitting, Cuff Size: Normal)   Pulse 76   Resp 18   Wt 204 lb 12.8 oz (92.9 kg)   SpO2 96%   BMI 37.46 kg/m   Encounter Medications:   Outpatient Encounter Prescriptions as of 11/12/2016  Medication Sig Note  . amiodarone (PACERONE) 200 MG tablet Take 1 tablet (200 mg total) by mouth 2  (two) times daily.   . CVS VITAMIN D3 1000 units capsule TAKE ONE CAPSULE BY MOUTH EVERY MORNING (Patient taking differently: TAKE ONE CAPSULE (1000 units) BY MOUTH EVERY MORNING)   . fluticasone (FLONASE) 50 MCG/ACT nasal spray Place 2 sprays into both nostrils daily as needed. For congestion.   Marland Kitchen losartan (COZAAR) 25 MG tablet Take 0.5 tablets (12.5 mg total) by mouth daily.   Marland Kitchen nystatin (MYCOSTATIN/NYSTOP) powder Apply topically 4 (four) times daily. 11/11/2016: Takes twice a day  . omeprazole (PRILOSEC) 20 MG capsule TAKE ONE CAPSULE BY MOUTH EVERY DAY   . OXYGEN Inhale 2 L into the lungs at bedtime. cpap machine   . PHENobarbital (LUMINAL) 32.4 MG tablet TAKE 2 TABLETS BY MOUTH TWICE A DAY   . spironolactone (ALDACTONE) 25 MG tablet Take 0.5 tablets (12.5 mg total) by mouth daily.   Marland Kitchen torsemide (DEMADEX) 20 MG tablet Take 1 tablet (20 mg total) by mouth 2 (two) times daily.   Marland Kitchen warfarin (COUMADIN) 5 MG tablet Take 2 tablets (10 mg total) by mouth one time only at 6 PM. (Patient taking differently: Take 5 mg by mouth one time only at 6 PM. ) 11/11/2016: Changed to 70m by doctor   No facility-administered encounter medications on file as of 11/12/2016.     Functional Status:   In your present state of health, do you have any difficulty performing the following activities: 10/14/2016 10/01/2016  Hearing? N -  Vision? N -  Difficulty concentrating  or making decisions? N -  Walking or climbing stairs? Y -  Dressing or bathing? N -  Doing errands, shopping? Tempie Donning  Preparing Food and eating ? N -  Using the Toilet? N -  In the past six months, have you accidently leaked urine? Y -  Do you have problems with loss of bowel control? N -  Managing your Medications? N -  Managing your Finances? N -  Housekeeping or managing your Housekeeping? N -  Some recent data might be hidden    Fall/Depression Screening:    PHQ 2/9 Scores 10/17/2016 10/14/2016 09/26/2016 09/19/2016 09/02/2016 08/30/2016  07/22/2016  PHQ - 2 Score 0 0 0 0 0 0 0    Assessment:    Met with member at scheduled time, paramedic, K. Lynch, present during visit.  Member's son is not present during first part of conversation, but later joins, providing added information.  They are both made aware that the member's blood pressure remains low.  Provided with blood pressure monitor for daily checks. Medication reviewed, pill box filled.  Toprol and Enalipril found mixed in pill bottles with active medications.  Unclear if these have been given to member in the past that may have contributed to her hypotension.  Both medications removed from pill bottles.  Son aware of current medication list, will refill phenobarbital.  Advised to take medication as prescribed.  Member and son are aware of proper diet to manage heart failure, member admits that she has not followed diet completely.  Complications of non-adherence to diet and heart failure management discussed, including another hospitalization.  Both son and member verbalize understanding.    Son and member deny questions, advised to contact with concerns.  [name] will follow up next week, will notify this care manager if another home visit by this care manger is needed.  Plan:   Will follow up with member next month for routine home visit.  Will follow sooner if necessary (will be notified by K. Lynch if visit is needed sooner).  [facility] CM Care Plan Problem One   Flowsheet Row Most Recent Value  Care Plan Problem One  Knowledge deficit related to management of heart failure as evidenced by inadequate follow through of instructions  Role Documenting the Problem One  Care Management [place] for Problem One  Active  [facility] Long Term Goal (31-90 days)  Member will be able to verbalize yellow zone of heart failure action plan within the next 31 days  [facility] Long Term Goal Start Date  11/12/16  [facility] Long Term Goal Met Date  11/12/16  Interventions for Problem One Long  Term Goal  Re-educated member and son on heart failure zones and when to notify physician.  Member was already provided with [facility] calendar tool with heart failure zones included.  [facility] CM Short Term Goal #1 (0-30 days)  Member will report a decrease in sodium intake over the next 4 weeks  [facility] CM Short Term Goal #1 Start Date  11/12/16  [facility] CM Short Term Goal #1 Met Date  10/28/16  Interventions for Short Term Goal #1  Member and son educted on appropriate heart failure diet, including decrease in sodium and fluid intake.  Also educated on decreasing intake of fast food, which has incrased sodium content  [facility] CM Short Term Goal #2 (0-30 days)  Member will keep and attend follow up appointments with PCP and cardiologist over the next 4 weeks  [facility] CM Short Term  Goal #2 Start Date  10/09/16  [facility] CM Short Term Goal #2 Met Date  11/06/16  Interventions for Short Term Goal #2  Educated member on importance of follow up appointments and the effect it has on decreasing readmission rates.  Offered assistance with transportation,member denies need, confirms she has transportation.    [facility] CM Care Plan Problem Two   Flowsheet Row Most Recent Value  Care Plan Problem Two  Risk for hospital admission related to hypotension as evidenced by statement of concern and documented blood pressure  Role Documenting the Problem Two  Care Management Coordinator  Care Plan for Problem Two  Active  [facility] CM Short Term Goal #1 (0-30 days)  Member will check and record blood pressure daily over the next 4 weeks  [facility] CM Short Term Goal #1 Start Date  11/12/16  Interventions for Short Term Goal #2   Member provided with blood pressure monitor, demonstration of correct usage provided to member and son with return demonstration. Educated on the importance of daily checks as well as when to contact [place], RN, MSN [place] Manager [phone]

## 2016-11-13 ENCOUNTER — Other Ambulatory Visit: Payer: Self-pay | Admitting: *Deleted

## 2016-11-13 MED ORDER — OMEPRAZOLE 20 MG PO CPDR: 20.0000 mg | DELAYED_RELEASE_CAPSULE | Freq: Every day | ORAL | 1 refills | Status: DC

## 2016-11-13 NOTE — Progress Notes (Signed)
Remote pacemaker transmission.   

## 2016-11-13 NOTE — Progress Notes (Signed)
   Subjective:    Patient ID: Misty Gray, female    DOB: 18-Dec-1950, 65 y.o.   MRN: [id number]  HPI 65 y/o female presents for routine follow up.  Systolic CHF Taking Torsemide 20 mg twice daily, on Amiodarone 200 mg BID through cardiology; no new breathing issues, no chest pain  Hypotension/Dizziness Patient evaluated on 11/27 for dizziness, asymptomatic at that time, thought to be multifactorial from deconditioning/hypotension. Current BP medications include Spironolactone 12.5 mg daily and Losartan 12.5 mg daily. No further episodes of dizziness/lightheadedness; home blood pressures low 100's/60-70's.   Leg Edema Unna boots placed last visit on 11/11/16.   Social Former Smoker   Review of Systems  Constitutional: Negative for chills, fatigue and fever.  Respiratory: Negative for choking and shortness of breath.   Cardiovascular: Negative for chest pain.       Objective:   Physical Exam BP 111/75   Pulse 87   Temp 97.5 F (36.4 C) (Oral)   Ht 5\' 2"  (1.575 m)   Wt 213 lb 9.6 oz (96.9 kg)   BMI 39.07 kg/m   Gen: pleasant female, NAD Cardiac: Irregular Irregular rhythm, no murmur Resp: CTAB, normal effort Ext: Unna Boots in place      Assessment & Plan:  Hypotension Blood pressures back in normal range. - continue Spironolactone 12.5 mg daily and Losartan 12.5 mg daily - continue to monitor home BP's  Chronic venous insufficiency Continue Unna Boots -return next week to have replaced  Chronic systolic dysfunction of left ventricle No clinical evidence for volume overload at this time.  -continue Torsemide 20 mg BID, Amiodarone 200 mg BID, Cozaar 12.5 mg daily, and Spironolactone 12.5 mg daily -continue to check home weights

## 2016-11-13 NOTE — Telephone Encounter (Signed)
Attempted to return call, no answer, left message that I would be available to speak for the next hour.

## 2016-11-14 ENCOUNTER — Encounter: Payer: Self-pay | Admitting: Family Medicine

## 2016-11-14 ENCOUNTER — Encounter: Payer: Self-pay | Admitting: Cardiology

## 2016-11-14 ENCOUNTER — Ambulatory Visit (INDEPENDENT_AMBULATORY_CARE_PROVIDER_SITE_OTHER): Payer: Medicare Other | Admitting: Family Medicine

## 2016-11-14 DIAGNOSIS — I872 Venous insufficiency (chronic) (peripheral): Secondary | ICD-10-CM | POA: Diagnosis not present

## 2016-11-14 DIAGNOSIS — I519 Heart disease, unspecified: Secondary | ICD-10-CM | POA: Diagnosis not present

## 2016-11-14 DIAGNOSIS — I959 Hypotension, unspecified: Secondary | ICD-10-CM

## 2016-11-14 MED ORDER — PHENOBARBITAL 32.4 MG PO TABS: 64.8000 mg | ORAL_TABLET | Freq: Two times a day (BID) | ORAL | 5 refills | Status: DC

## 2016-11-14 NOTE — Telephone Encounter (Signed)
RN staff - please contact [facility] nurse, patient seen in office today, BP improved, does need continuation of Unna Boots. Ok to discharge patient from their care if they have no additional services to offer.

## 2016-11-14 NOTE — Patient Instructions (Signed)
It was nice to see you today.  Unna Boots - please schedule a nurse visit next week to change the [employer]  Blood pressure - improved, continue current course.   Heart Failure - continue to check weights at home, continue Torsemide 20 mg twice daily

## 2016-11-14 NOTE — Assessment & Plan Note (Signed)
Blood pressures back in normal range. - continue Spironolactone 12.5 mg daily and Losartan 12.5 mg daily - continue to monitor home BP's

## 2016-11-14 NOTE — Assessment & Plan Note (Addendum)
No clinical evidence for volume overload at this time.  -continue Torsemide 20 mg BID, Amiodarone 200 mg BID, Cozaar 12.5 mg daily, and Spironolactone 12.5 mg daily -continue to check home weights

## 2016-11-14 NOTE — Assessment & Plan Note (Signed)
Continue [employer] -return next week to have replaced

## 2016-11-14 NOTE — Telephone Encounter (Signed)
[facility] informed. Margarito Dehaas Bruna Potter, CMA

## 2016-11-21 ENCOUNTER — Encounter: Payer: Self-pay | Admitting: Internal Medicine

## 2016-11-21 ENCOUNTER — Encounter: Payer: Self-pay | Admitting: Licensed Clinical Social Worker

## 2016-11-21 ENCOUNTER — Ambulatory Visit (INDEPENDENT_AMBULATORY_CARE_PROVIDER_SITE_OTHER): Payer: Medicare Other | Admitting: Internal Medicine

## 2016-11-21 ENCOUNTER — Telehealth (HOSPITAL_COMMUNITY): Payer: Self-pay | Admitting: *Deleted

## 2016-11-21 VITALS — BP 104/55 | HR 71 | Temp 97.7°F | Ht 62.0 in | Wt 222.0 lb

## 2016-11-21 DIAGNOSIS — I519 Heart disease, unspecified: Secondary | ICD-10-CM

## 2016-11-21 DIAGNOSIS — I482 Chronic atrial fibrillation, unspecified: Secondary | ICD-10-CM

## 2016-11-21 DIAGNOSIS — I872 Venous insufficiency (chronic) (peripheral): Secondary | ICD-10-CM

## 2016-11-21 DIAGNOSIS — Z599 Problem related to housing and economic circumstances, unspecified: Secondary | ICD-10-CM

## 2016-11-21 LAB — POCT INR: INR: 1.5
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
## 2016-11-21 NOTE — Patient Instructions (Signed)
Ms. Capone,  I recommend using a walker to help prevent falls given dizziness and trouble feeling your feet.  If you feel increased shortness of breath or have fever, please come to clinic sooner than next week for your regular appointment or to the Emergency Room if no appointments.  Please try to limit fluids to less than 2 liters a day so that we can see how your weight does with that rather than changing your medications.  Best, Dr. Sampson Goon
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
## 2016-11-23 ENCOUNTER — Encounter: Payer: Self-pay | Admitting: Internal Medicine

## 2016-11-23 DIAGNOSIS — Z599 Problem related to housing and economic circumstances, unspecified: Secondary | ICD-10-CM | POA: Insufficient documentation

## 2016-11-23 NOTE — Assessment & Plan Note (Signed)
-   Asked LCSW to meet with patient to discuss resources

## 2016-11-23 NOTE — Assessment & Plan Note (Signed)
-   Reapply unna boots for continued edema; reassess next week - Recommended using walker to help with fall risk

## 2016-11-23 NOTE — Assessment & Plan Note (Signed)
-   Continue current medication regimen. BP is chronically low and improved with recheck. - Pt has follow-up with heart failure clinic next week. - Advised patient to be adherent with fluid restriction of 2L in order to better determine if torsemide needed to be increased considering weight gain - Advised patient to seek earlier appointment or present to ED if has worsening shortness of breath

## 2016-11-26 ENCOUNTER — Emergency Department (HOSPITAL_COMMUNITY)
Admission: EM | Admit: 2016-11-26 | Discharge: 2016-11-26 | Disposition: A | Discharge status: Home / Self Care | Payer: Medicare Other | Source: Home / Self Care | Attending: Emergency Medicine | Admitting: Emergency Medicine

## 2016-11-26 ENCOUNTER — Emergency Department (HOSPITAL_COMMUNITY): Payer: Medicare Other

## 2016-11-26 ENCOUNTER — Encounter (HOSPITAL_COMMUNITY): Payer: Self-pay | Admitting: Nurse Practitioner

## 2016-11-26 DIAGNOSIS — I493 Ventricular premature depolarization: Secondary | ICD-10-CM | POA: Diagnosis not present

## 2016-11-26 DIAGNOSIS — I119 Hypertensive heart disease without heart failure: Secondary | ICD-10-CM | POA: Diagnosis not present

## 2016-11-26 DIAGNOSIS — B9620 Unspecified Escherichia coli [E. coli] as the cause of diseases classified elsewhere: Secondary | ICD-10-CM | POA: Diagnosis not present

## 2016-11-26 DIAGNOSIS — Z8673 Personal history of transient ischemic attack (TIA), and cerebral infarction without residual deficits: Secondary | ICD-10-CM

## 2016-11-26 DIAGNOSIS — Z950 Presence of cardiac pacemaker: Secondary | ICD-10-CM | POA: Insufficient documentation

## 2016-11-26 DIAGNOSIS — Z809 Family history of malignant neoplasm, unspecified: Secondary | ICD-10-CM | POA: Diagnosis not present

## 2016-11-26 DIAGNOSIS — I872 Venous insufficiency (chronic) (peripheral): Secondary | ICD-10-CM | POA: Diagnosis not present

## 2016-11-26 DIAGNOSIS — I509 Heart failure, unspecified: Secondary | ICD-10-CM | POA: Insufficient documentation

## 2016-11-26 DIAGNOSIS — I110 Hypertensive heart disease with heart failure: Secondary | ICD-10-CM

## 2016-11-26 DIAGNOSIS — S300XXA Contusion of lower back and pelvis, initial encounter: Secondary | ICD-10-CM | POA: Insufficient documentation

## 2016-11-26 DIAGNOSIS — T501X5A Adverse effect of loop [high-ceiling] diuretics, initial encounter: Secondary | ICD-10-CM | POA: Diagnosis not present

## 2016-11-26 DIAGNOSIS — I5082 Biventricular heart failure: Secondary | ICD-10-CM | POA: Diagnosis not present

## 2016-11-26 DIAGNOSIS — W109XXA Fall (on) (from) unspecified stairs and steps, initial encounter: Secondary | ICD-10-CM | POA: Insufficient documentation

## 2016-11-26 DIAGNOSIS — K219 Gastro-esophageal reflux disease without esophagitis: Secondary | ICD-10-CM | POA: Diagnosis not present

## 2016-11-26 DIAGNOSIS — S3992XA Unspecified injury of lower back, initial encounter: Secondary | ICD-10-CM | POA: Diagnosis not present

## 2016-11-26 DIAGNOSIS — N390 Urinary tract infection, site not specified: Secondary | ICD-10-CM | POA: Diagnosis not present

## 2016-11-26 DIAGNOSIS — I482 Chronic atrial fibrillation: Secondary | ICD-10-CM | POA: Diagnosis not present

## 2016-11-26 DIAGNOSIS — J9811 Atelectasis: Secondary | ICD-10-CM | POA: Diagnosis not present

## 2016-11-26 DIAGNOSIS — Z87891 Personal history of nicotine dependence: Secondary | ICD-10-CM | POA: Insufficient documentation

## 2016-11-26 DIAGNOSIS — Y999 Unspecified external cause status: Secondary | ICD-10-CM | POA: Insufficient documentation

## 2016-11-26 DIAGNOSIS — S0990XA Unspecified injury of head, initial encounter: Secondary | ICD-10-CM | POA: Diagnosis not present

## 2016-11-26 DIAGNOSIS — I5023 Acute on chronic systolic (congestive) heart failure: Secondary | ICD-10-CM | POA: Diagnosis not present

## 2016-11-26 DIAGNOSIS — Z823 Family history of stroke: Secondary | ICD-10-CM | POA: Diagnosis not present

## 2016-11-26 DIAGNOSIS — N179 Acute kidney failure, unspecified: Secondary | ICD-10-CM | POA: Diagnosis not present

## 2016-11-26 DIAGNOSIS — Y92008 Other place in unspecified non-institutional (private) residence as the place of occurrence of the external cause: Secondary | ICD-10-CM | POA: Insufficient documentation

## 2016-11-26 DIAGNOSIS — I959 Hypotension, unspecified: Secondary | ICD-10-CM | POA: Diagnosis not present

## 2016-11-26 DIAGNOSIS — E785 Hyperlipidemia, unspecified: Secondary | ICD-10-CM | POA: Diagnosis not present

## 2016-11-26 DIAGNOSIS — Y9301 Activity, walking, marching and hiking: Secondary | ICD-10-CM | POA: Insufficient documentation

## 2016-11-26 DIAGNOSIS — R5383 Other fatigue: Secondary | ICD-10-CM | POA: Diagnosis not present

## 2016-11-26 DIAGNOSIS — D61818 Other pancytopenia: Secondary | ICD-10-CM | POA: Diagnosis not present

## 2016-11-26 DIAGNOSIS — Z7901 Long term (current) use of anticoagulants: Secondary | ICD-10-CM

## 2016-11-26 DIAGNOSIS — W19XXXA Unspecified fall, initial encounter: Secondary | ICD-10-CM

## 2016-11-26 DIAGNOSIS — E876 Hypokalemia: Secondary | ICD-10-CM | POA: Diagnosis not present

## 2016-11-26 DIAGNOSIS — Z79899 Other long term (current) drug therapy: Secondary | ICD-10-CM | POA: Diagnosis not present

## 2016-11-26 DIAGNOSIS — G4733 Obstructive sleep apnea (adult) (pediatric): Secondary | ICD-10-CM | POA: Diagnosis not present

## 2016-11-26 DIAGNOSIS — I429 Cardiomyopathy, unspecified: Secondary | ICD-10-CM | POA: Diagnosis not present

## 2016-11-26 DIAGNOSIS — D696 Thrombocytopenia, unspecified: Secondary | ICD-10-CM | POA: Diagnosis not present

## 2016-11-26 DIAGNOSIS — M545 Low back pain: Secondary | ICD-10-CM | POA: Diagnosis not present

## 2016-11-26 LAB — BASIC METABOLIC PANEL
Anion gap: 9 (ref 5–15)
BUN: 13 mg/dL (ref 6–20)
CHLORIDE: 102 mmol/L (ref 101–111)
CO2: 26 mmol/L (ref 22–32)
Calcium: 9.2 mg/dL (ref 8.9–10.3)
Creatinine, Ser: 1.04 mg/dL — ABNORMAL HIGH (ref 0.44–1.00)
GFR calc non Af Amer: 54 mL/min — ABNORMAL LOW (ref >60)
Glucose, Bld: 99 mg/dL (ref 65–99)
POTASSIUM: 3.2 mmol/L — AB (ref 3.5–5.1)
SODIUM: 137 mmol/L (ref 135–145)

## 2016-11-26 LAB — CBC
HCT: 37.2 % (ref 36.0–46.0)
HEMOGLOBIN: 12.5 g/dL (ref 12.0–15.0)
MCH: 30.9 pg (ref 26.0–34.0)
MCHC: 33.6 g/dL (ref 30.0–36.0)
MCV: 92.1 fL (ref 78.0–100.0)
Platelets: 104 10*3/uL — ABNORMAL LOW (ref 150–400)
RBC: 4.04 MIL/uL (ref 3.87–5.11)
RDW: 17.6 % — ABNORMAL HIGH (ref 11.5–15.5)
WBC: 4.5 10*3/uL (ref 4.0–10.5)

## 2016-11-26 LAB — PROTIME-INR
INR: 2.43
PROTHROMBIN TIME: 26.8 s — AB (ref 11.4–15.2)

## 2016-11-26 NOTE — ED Triage Notes (Addendum)
Pt presents with c/o lower back pain. The pain began on Sunday after she slipped on ice and fell onto her back. She has not tried anything at home for pain. She reports bruising to her lower back. She denies any other injuries or LOC during the fall. She is currently on blood thinners

## 2016-11-26 NOTE — ED Provider Notes (Signed)
[facility] Provider Note   CSN: [id number] Arrival date & time: 11/26/16  1214  By signing my name below, I, Majel Homer, attest that this documentation has been prepared under the direction and in the presence of Linwood Dibbles, MD . Electronically Signed: Majel Homer, Scribe. 11/26/2016. 2:06 PM.  History   Chief Complaint Chief Complaint  Patient presents with  . Fall   The history is provided by the patient. No language interpreter was used.   HPI Comments: Misty Gray is a 65 y.o. female who presents to the Emergency Department complaining of gradually worsening, lower back pain s/p a fall that occurred 2 days ago. Pt reports she was walking down her porch steps 2 days ago when she suddenly slipped on ice and fell backwards, landing on her right side. She denies hitting her head or loss of consciousness. Pt reports her pain is now centralized over her lower back with associated bruising. She states she has not taken any medication for her pain. Pt also notes her urine looks "more yellow than usual" and states it is usually clear. She denies hematuria. Pt's son notes pt is currently taking Warfarin.   Past Medical History:  Diagnosis Date  . Abnormal CT of the head 12/16/1984   R parietal atrophy  . Allergic rhinitis, cause unspecified   . Anemia   . Arthritis    "hands and knees" (09/06/2015)  . Atrial fibrillation (HCC)   . Cellulitis 09/07/2015  . Cellulitis and abscess of leg 09/2016   bilateral  . CHF (congestive heart failure) (HCC)   . Diaphragmatic hernia without mention of obstruction or gangrene   . Dyspnea   . Exertional dyspnea 06/22/12  . Family history of adverse reaction to anesthesia    "daughter fights w/them; just can't relax during OR; stay awake during the OR"  . Female stress incontinence   . GERD (gastroesophageal reflux disease)   . Grand mal seizure (HCC)   . H/O hiatal hernia    two removed  . Heart murmur   . High cholesterol   . History of  blood transfusion 1956   "related to nose bleed"  . Hypertension   . Influenza A 01/11/2014  . Lichenification and lichen simplex chronicus   . Migraine    "when I was a teenager"  . Morbid obesity (HCC)   . Nonischemic cardiomyopathy (HCC)    EF has normalized-repeat Pending   . On home oxygen therapy    "2L when I'm asleep in bed" (09/06/2015)  . OSA on CPAP   . Pacemaker  st judes    Patient states "this is my third pacemaker"  . Pneumonia 2004; 2011; 11/2011  . Seizures (HCC) since 1981   "can hear you talking but sounds like you are in big tunnel; have them often if not taking RX; last one was 06/17/12" (09/06/2015)  . Shortness of breath 10/24/2010   Qualifier: Diagnosis of  By: [place], Bonnie    . Sick sinus syndrome (HCC)    WITH PRIOR DDD PACEMAKER IMPLANTATION  . Stroke [facility]) 1988   "mouth drawed real bad on my left side; found out it was a seizure"  . Syncope and collapse 06/22/12   "hit forehead and left knee"; denies loss of consciousness  . Unspecified venous (peripheral) insufficiency     Patient Active Problem List   Diagnosis Date Noted  . Housing problems 11/23/2016  . Chronic a-fib (HCC) 11/01/2016  . Hypotension 10/31/2016  . Special screening  for malignant neoplasms, colon 08/12/2016  . Heme positive stool 08/09/2016  . Intertrigo 08/09/2016  . Chronic anticoagulation 04/26/2016  . Chronic venous insufficiency   . Preventative health care 10/05/2014  . Breast mass, left 10/05/2014  . Osteopenia 09/15/2014  . Hypertension 01/04/2014  . Chronic systolic dysfunction of left ventricle 05/05/2013  . At high risk for falls 02/11/2013  . Vitamin D deficiency 07/15/2012  . Persistent atrial fibrillation (HCC) 06/14/2012  . Sinoatrial node dysfunction (HCC) 05/08/2011  . ALLERGIC RHINITIS 05/31/2008  . Morbid obesity (HCC) 02/12/2007  . GASTROESOPHAGEAL REFLUX, NO ESOPHAGITIS 02/12/2007  . Female stress incontinence 02/12/2007  . Seizure disorder (HCC)  02/12/2007  . Sleep apnea 02/12/2007    Past Surgical History:  Procedure Laterality Date  . APPENDECTOMY    . CARDIAC CATHETERIZATION N/A 10/03/2016   Procedure: Right/Left Heart Cath and Coronary Angiography;  Surgeon: Dolores Patty, MD;  Location[facility] INVASIVE CV LAB;  Service: Cardiovascular;  Laterality: N/A;  . HERNIA REPAIR  ? 1988; 04/15/1998  . INSERT / REPLACE / REMOVE PACEMAKER  12/16/1990   DDD EF 30%;  Marland Kitchen INSERT / REPLACE / REMOVE PACEMAKER  07/25/2003   replaced by BB, leads are both IS1 and from 1992  . INSERT / REPLACE / REMOVE PACEMAKER  05/21/2011   JA; generator change  . LEG SKIN LESION  BIOPSY / EXCISION  05/16/2001   Lichen Planus  . VENTRAL HERNIA REPAIR  1989; 04/15/1998    OB History    No data available     Home Medications    Prior to Admission medications   Medication Sig Start Date End Date Taking? Authorizing Provider  amiodarone (PACERONE) 200 MG tablet Take 1 tablet (200 mg total) by mouth 2 (two) times daily. 10/24/16   Dolores Patty, MD  [employer] VITAMIN D3 1000 units capsule TAKE ONE CAPSULE BY MOUTH EVERY MORNING Patient taking differently: TAKE ONE CAPSULE (1000 units) BY MOUTH EVERY MORNING 08/12/16   Uvaldo Rising, MD  fluticasone (FLONASE) 50 MCG/ACT nasal spray Place 2 sprays into both nostrils daily as needed. For congestion. 04/21/15   Renee A Kuneff, DO  losartan (COZAAR) 25 MG tablet Take 0.5 tablets (12.5 mg total) by mouth daily. 10/22/16   Dolores Patty, MD  nystatin (MYCOSTATIN/NYSTOP) powder Apply topically 4 (four) times daily. 10/31/16   Uvaldo Rising, MD  omeprazole (PRILOSEC) 20 MG capsule Take 1 capsule (20 mg total) by mouth daily. 11/13/16   Uvaldo Rising, MD  OXYGEN Inhale 2 L into the lungs at bedtime. cpap machine    Historical Provider, MD  PHENobarbital (LUMINAL) 32.4 MG tablet Take 2 tablets (64.8 mg total) by mouth 2 (two) times daily. 11/14/16   Uvaldo Rising, MD  spironolactone (ALDACTONE) 25 MG tablet Take 0.5 tablets  (12.5 mg total) by mouth daily. 11/01/16   Graciella Freer, PA-C  torsemide (DEMADEX) 20 MG tablet Take 1 tablet (20 mg total) by mouth 2 (two) times daily. 10/22/16   Dolores Patty, MD  warfarin (COUMADIN) 5 MG tablet Take 2 tablets (10 mg total) by mouth one time only at 6 PM. Patient taking differently: Take 5 mg by mouth one time only at 6 PM.  10/09/16   Uvaldo Rising, MD    Family History Family History  Problem Relation Age of Onset  . Stroke Father     died from CAD?  Marland Kitchen Heart disease Father   . Cancer Mother   . Diabetes  Son     Type 1  . Sleep apnea Son   . Cancer Sister     cervical  . Hypertension Sister   . Hypertension Brother   . Rheum arthritis Daughter     Also PGM, PGGM    Social History Social History  Substance Use Topics  . Smoking status: Former Smoker    Packs/day: 1.00    Years: 7.00    Types: Cigarettes    Quit date: 03/16/1969  . Smokeless tobacco: Never Used  . Alcohol use No     Allergies   Aspirin; Penicillins; Tape; Wool alcohol [lanolin]; Amoxicillin; and Ampicillin   Review of Systems Review of Systems  Genitourinary: Negative for hematuria.  Musculoskeletal: Positive for back pain.  Neurological: Negative for syncope.   Physical Exam Updated Vital Signs BP 117/80 (BP Location: Left Arm)   Pulse 76   Temp 98.1 F (36.7 C) (Oral)   Resp 16   Ht 5\' 2"  (1.575 m)   Wt 230 lb (104.3 kg)   SpO2 97%   BMI 42.07 kg/m   Physical Exam  Constitutional: No distress.  HENT:  Head: Normocephalic and atraumatic.  Right Ear: External ear normal.  Left Ear: External ear normal.  Eyes: Conjunctivae are normal. Right eye exhibits no discharge. Left eye exhibits no discharge. No scleral icterus.  Neck: Neck supple. No tracheal deviation present.  Cardiovascular: Normal rate, regular rhythm and intact distal pulses.   Pulmonary/Chest: Effort normal and breath sounds normal. No stridor. No respiratory distress. She has no wheezes.  She has no rales.  Abdominal: Soft. Bowel sounds are normal. She exhibits no distension. There is no tenderness. There is no rebound and no guarding.  Musculoskeletal: She exhibits edema.       Lumbar back: She exhibits tenderness and bony tenderness. She exhibits no swelling and no edema.  No ecchymosis noted in the back. Chronic lymphedema to BLE with compressive dressings.   Neurological: She is alert. She has normal strength. No cranial nerve deficit (no facial droop, extraocular movements intact, no slurred speech) or sensory deficit. She exhibits normal muscle tone. She displays no seizure activity. Coordination normal.  Skin: Skin is warm and dry. No rash noted.  Psychiatric: She has a normal mood and affect.  Nursing note and vitals reviewed.  ED Treatments / Results  Labs (all labs ordered are listed, but only abnormal results are displayed) Labs Reviewed  CBC - Abnormal; Notable for the following:       Result Value   RDW 17.6 (*)    Platelets 104 (*)    All other components within normal limits  BASIC METABOLIC PANEL - Abnormal; Notable for the following:    Potassium 3.2 (*)    Creatinine, Ser 1.04 (*)    GFR calc non Af Amer 54 (*)    All other components within normal limits  PROTIME-INR - Abnormal; Notable for the following:    Prothrombin Time 26.8 (*)    All other components within normal limits    EKG  EKG Interpretation None       Radiology Dg Lumbar Spine Complete  Result Date: 11/26/2016 CLINICAL DATA:  Back pain.  Fall EXAM: LUMBAR SPINE - COMPLETE 4+ VIEW COMPARISON:  05/25/2015 FINDINGS: Mild anterolisthesis L4-5 unchanged. Disc degeneration at L4-5 and L5-S1. Facet degeneration at L5-S1. Negative for fracture or pars defect. No skeletal mass lesion. IMPRESSION: Disc and facet degeneration at L4-5 with mild anterior slip. Disc degeneration L5-S1. No acute abnormality.  Electronically Signed   By: Marlan Palauharles  Clark M.D.   On: 11/26/2016 15:00     Procedures Procedures (including critical care time)  Medications Ordered in ED Medications - No data to display  DIAGNOSTIC STUDIES:  Oxygen Saturation is 97% on RA, normal by my interpretation.    COORDINATION OF CARE:  2:00 PM Discussed treatment plan with pt at bedside and pt agreed to plan.  Initial Impression / Assessment and Plan / ED Course  I have reviewed the triage vital signs and the nursing notes.  Pertinent labs & imaging results that were available during my care of the patient were reviewed by me and considered in my medical decision making (see chart for details).  Clinical Course   Xrays without fracture.  Labs are reassuring.  Mechanical fall.  Lumbar contusion.  No sign of serious injury.  Rec OTC tylenol for pain, discomfort  Final Clinical Impressions(s) / ED Diagnoses   Final diagnoses:  Fall, initial encounter    New Prescriptions New Prescriptions   No medications on file   I personally performed the services described in this documentation, which was scribed in my presence.  The recorded information has been reviewed and is accurate.     Linwood DibblesJon Rosealie Reach, MD 11/26/16 1537

## 2016-11-26 NOTE — ED Notes (Signed)
Pt verbalized understanding discharge instructions and denies any further needs or questions at this time. VS stable 

## 2016-11-26 NOTE — Discharge Instructions (Signed)
Take tylenol as needed for the pain, follow up with your primary care doctor if not getting better in the week

## 2016-11-28 ENCOUNTER — Ambulatory Visit (INDEPENDENT_AMBULATORY_CARE_PROVIDER_SITE_OTHER): Payer: Medicare Other | Admitting: *Deleted

## 2016-11-28 ENCOUNTER — Inpatient Hospital Stay (HOSPITAL_COMMUNITY)
Admission: EM | Admit: 2016-11-28 | Discharge: 2016-12-06 | DRG: 292 | Disposition: A | Discharge status: Home / Self Care | Payer: Medicare Other | Attending: Family Medicine | Admitting: Family Medicine

## 2016-11-28 ENCOUNTER — Ambulatory Visit (INDEPENDENT_AMBULATORY_CARE_PROVIDER_SITE_OTHER): Payer: Medicare Other | Admitting: Family Medicine

## 2016-11-28 ENCOUNTER — Emergency Department (HOSPITAL_COMMUNITY): Payer: Medicare Other

## 2016-11-28 ENCOUNTER — Encounter: Payer: Self-pay | Admitting: Family Medicine

## 2016-11-28 ENCOUNTER — Encounter (HOSPITAL_COMMUNITY): Payer: Self-pay | Admitting: Emergency Medicine

## 2016-11-28 DIAGNOSIS — R400 Somnolence: Secondary | ICD-10-CM

## 2016-11-28 DIAGNOSIS — I878 Other specified disorders of veins: Secondary | ICD-10-CM | POA: Diagnosis present

## 2016-11-28 DIAGNOSIS — E785 Hyperlipidemia, unspecified: Secondary | ICD-10-CM | POA: Diagnosis present

## 2016-11-28 DIAGNOSIS — Z79899 Other long term (current) drug therapy: Secondary | ICD-10-CM | POA: Diagnosis not present

## 2016-11-28 DIAGNOSIS — Z809 Family history of malignant neoplasm, unspecified: Secondary | ICD-10-CM

## 2016-11-28 DIAGNOSIS — I119 Hypertensive heart disease without heart failure: Secondary | ICD-10-CM | POA: Diagnosis not present

## 2016-11-28 DIAGNOSIS — Z9981 Dependence on supplemental oxygen: Secondary | ICD-10-CM

## 2016-11-28 DIAGNOSIS — W19XXXD Unspecified fall, subsequent encounter: Secondary | ICD-10-CM

## 2016-11-28 DIAGNOSIS — K219 Gastro-esophageal reflux disease without esophagitis: Secondary | ICD-10-CM | POA: Diagnosis present

## 2016-11-28 DIAGNOSIS — Z833 Family history of diabetes mellitus: Secondary | ICD-10-CM

## 2016-11-28 DIAGNOSIS — I872 Venous insufficiency (chronic) (peripheral): Secondary | ICD-10-CM | POA: Diagnosis present

## 2016-11-28 DIAGNOSIS — I110 Hypertensive heart disease with heart failure: Principal | ICD-10-CM | POA: Diagnosis present

## 2016-11-28 DIAGNOSIS — I482 Chronic atrial fibrillation: Secondary | ICD-10-CM

## 2016-11-28 DIAGNOSIS — R5383 Other fatigue: Secondary | ICD-10-CM

## 2016-11-28 DIAGNOSIS — Z87891 Personal history of nicotine dependence: Secondary | ICD-10-CM | POA: Diagnosis not present

## 2016-11-28 DIAGNOSIS — B9620 Unspecified Escherichia coli [E. coli] as the cause of diseases classified elsewhere: Secondary | ICD-10-CM | POA: Diagnosis present

## 2016-11-28 DIAGNOSIS — I959 Hypotension, unspecified: Secondary | ICD-10-CM | POA: Diagnosis not present

## 2016-11-28 DIAGNOSIS — I5082 Biventricular heart failure: Secondary | ICD-10-CM | POA: Diagnosis present

## 2016-11-28 DIAGNOSIS — Z823 Family history of stroke: Secondary | ICD-10-CM | POA: Diagnosis not present

## 2016-11-28 DIAGNOSIS — R4182 Altered mental status, unspecified: Secondary | ICD-10-CM

## 2016-11-28 DIAGNOSIS — Z6841 Body Mass Index (BMI) 40.0 and over, adult: Secondary | ICD-10-CM | POA: Diagnosis not present

## 2016-11-28 DIAGNOSIS — I5023 Acute on chronic systolic (congestive) heart failure: Secondary | ICD-10-CM | POA: Diagnosis not present

## 2016-11-28 DIAGNOSIS — Z8701 Personal history of pneumonia (recurrent): Secondary | ICD-10-CM

## 2016-11-28 DIAGNOSIS — N390 Urinary tract infection, site not specified: Secondary | ICD-10-CM

## 2016-11-28 DIAGNOSIS — I509 Heart failure, unspecified: Secondary | ICD-10-CM | POA: Diagnosis not present

## 2016-11-28 DIAGNOSIS — D61818 Other pancytopenia: Secondary | ICD-10-CM | POA: Diagnosis present

## 2016-11-28 DIAGNOSIS — J9811 Atelectasis: Secondary | ICD-10-CM | POA: Diagnosis not present

## 2016-11-28 DIAGNOSIS — R0902 Hypoxemia: Secondary | ICD-10-CM | POA: Diagnosis not present

## 2016-11-28 DIAGNOSIS — E876 Hypokalemia: Secondary | ICD-10-CM | POA: Diagnosis not present

## 2016-11-28 DIAGNOSIS — M8580 Other specified disorders of bone density and structure, unspecified site: Secondary | ICD-10-CM | POA: Diagnosis present

## 2016-11-28 DIAGNOSIS — N179 Acute kidney failure, unspecified: Secondary | ICD-10-CM | POA: Diagnosis not present

## 2016-11-28 DIAGNOSIS — Z8673 Personal history of transient ischemic attack (TIA), and cerebral infarction without residual deficits: Secondary | ICD-10-CM

## 2016-11-28 DIAGNOSIS — I429 Cardiomyopathy, unspecified: Secondary | ICD-10-CM | POA: Diagnosis present

## 2016-11-28 DIAGNOSIS — I493 Ventricular premature depolarization: Secondary | ICD-10-CM

## 2016-11-28 DIAGNOSIS — S0990XA Unspecified injury of head, initial encounter: Secondary | ICD-10-CM | POA: Diagnosis not present

## 2016-11-28 DIAGNOSIS — T501X5A Adverse effect of loop [high-ceiling] diuretics, initial encounter: Secondary | ICD-10-CM | POA: Diagnosis present

## 2016-11-28 DIAGNOSIS — G4733 Obstructive sleep apnea (adult) (pediatric): Secondary | ICD-10-CM | POA: Diagnosis present

## 2016-11-28 DIAGNOSIS — D696 Thrombocytopenia, unspecified: Secondary | ICD-10-CM | POA: Diagnosis not present

## 2016-11-28 DIAGNOSIS — Z7901 Long term (current) use of anticoagulants: Secondary | ICD-10-CM | POA: Diagnosis not present

## 2016-11-28 DIAGNOSIS — Z9119 Patient's noncompliance with other medical treatment and regimen: Secondary | ICD-10-CM

## 2016-11-28 DIAGNOSIS — Z8249 Family history of ischemic heart disease and other diseases of the circulatory system: Secondary | ICD-10-CM

## 2016-11-28 HISTORY — DX: Altered mental status, unspecified: R41.82

## 2016-11-28 LAB — COMPREHENSIVE METABOLIC PANEL
ALK PHOS: 105 U/L (ref 38–126)
ALT: 17 U/L (ref 14–54)
ANION GAP: 10 (ref 5–15)
AST: 24 U/L (ref 15–41)
Albumin: 3.5 g/dL (ref 3.5–5.0)
BUN: 17 mg/dL (ref 6–20)
CALCIUM: 9.4 mg/dL (ref 8.9–10.3)
CO2: 26 mmol/L (ref 22–32)
CREATININE: 1.12 mg/dL — AB (ref 0.44–1.00)
Chloride: 101 mmol/L (ref 101–111)
GFR, EST AFRICAN AMERICAN: 58 mL/min — AB (ref >60)
GFR, EST NON AFRICAN AMERICAN: 50 mL/min — AB (ref >60)
Glucose, Bld: 109 mg/dL — ABNORMAL HIGH (ref 65–99)
Potassium: 3.3 mmol/L — ABNORMAL LOW (ref 3.5–5.1)
SODIUM: 137 mmol/L (ref 135–145)
Total Bilirubin: 1.6 mg/dL — ABNORMAL HIGH (ref 0.3–1.2)
Total Protein: 7.7 g/dL (ref 6.5–8.1)

## 2016-11-28 LAB — URINALYSIS, ROUTINE W REFLEX MICROSCOPIC
BILIRUBIN URINE: NEGATIVE
GLUCOSE, UA: NEGATIVE mg/dL
KETONES UR: NEGATIVE mg/dL
NITRITE: NEGATIVE
Protein, ur: NEGATIVE mg/dL
Specific Gravity, Urine: 1.01 (ref 1.005–1.030)
pH: 6 (ref 5.0–8.0)

## 2016-11-28 LAB — CBC
HCT: 40.2 % (ref 36.0–46.0)
HEMOGLOBIN: 13.3 g/dL (ref 12.0–15.0)
MCH: 30.9 pg (ref 26.0–34.0)
MCHC: 33.1 g/dL (ref 30.0–36.0)
MCV: 93.3 fL (ref 78.0–100.0)
Platelets: 98 10*3/uL — ABNORMAL LOW (ref 150–400)
RBC: 4.31 MIL/uL (ref 3.87–5.11)
RDW: 17.8 % — ABNORMAL HIGH (ref 11.5–15.5)
WBC: 4.2 10*3/uL (ref 4.0–10.5)

## 2016-11-28 LAB — I-STAT VENOUS BLOOD GAS, ED
Acid-Base Excess: 3 mmol/L — ABNORMAL HIGH (ref 0.0–2.0)
BICARBONATE: 27 mmol/L (ref 20.0–28.0)
O2 Saturation: 46 %
PCO2 VEN: 39.9 mmHg — AB (ref 44.0–60.0)
PO2 VEN: 25 mmHg — AB (ref 32.0–45.0)
TCO2: 28 mmol/L (ref 0–100)
pH, Ven: 7.439 — ABNORMAL HIGH (ref 7.250–7.430)

## 2016-11-28 LAB — CBG MONITORING, ED: Glucose-Capillary: 77 mg/dL (ref 65–99)

## 2016-11-28 LAB — PROTIME-INR
INR: 2.27
Prothrombin Time: 25.5 seconds — ABNORMAL HIGH (ref 11.4–15.2)

## 2016-11-28 LAB — I-STAT CG4 LACTIC ACID, ED: LACTIC ACID, VENOUS: 1.4 mmol/L (ref 0.5–1.9)

## 2016-11-28 LAB — POCT INR: INR: 2.2

## 2016-11-28 LAB — BRAIN NATRIURETIC PEPTIDE: B Natriuretic Peptide: 909.9 pg/mL — ABNORMAL HIGH (ref 0.0–100.0)

## 2016-11-28 MED ORDER — SODIUM CHLORIDE 0.9% FLUSH
3.0000 mL | Freq: Two times a day (BID) | INTRAVENOUS | Status: DC
  Administered 2016-11-28 – 2016-12-06 (×16): 3 mL via INTRAVENOUS

## 2016-11-28 MED ORDER — SODIUM CHLORIDE 0.9 % IV SOLN: 250.0000 mL | INTRAVENOUS | Status: DC | PRN

## 2016-11-28 MED ORDER — PHENOBARBITAL 32.4 MG PO TABS
64.8000 mg | ORAL_TABLET | Freq: Two times a day (BID) | ORAL | Status: DC
  Administered 2016-11-28 – 2016-12-06 (×16): 64.8 mg via ORAL
  Filled 2016-11-28 (×16): qty 2

## 2016-11-28 MED ORDER — WARFARIN SODIUM 5 MG PO TABS
5.0000 mg | ORAL_TABLET | ORAL | Status: DC
  Administered 2016-11-29 – 2016-12-04 (×5): 5 mg via ORAL
  Filled 2016-11-28 (×5): qty 1

## 2016-11-28 MED ORDER — FUROSEMIDE 10 MG/ML IJ SOLN
40.0000 mg | INTRAMUSCULAR | Status: AC
  Administered 2016-11-28: 40 mg via INTRAVENOUS
  Filled 2016-11-28: qty 4

## 2016-11-28 MED ORDER — FLUTICASONE PROPIONATE 50 MCG/ACT NA SUSP: 2.0000 | Freq: Every day | NASAL | Status: DC | PRN

## 2016-11-28 MED ORDER — AMIODARONE HCL 200 MG PO TABS
200.0000 mg | ORAL_TABLET | Freq: Two times a day (BID) | ORAL | Status: DC
  Administered 2016-11-28 – 2016-12-06 (×16): 200 mg via ORAL
  Filled 2016-11-28 (×16): qty 1

## 2016-11-28 MED ORDER — WARFARIN SODIUM 10 MG PO TABS: 10.0000 mg | ORAL_TABLET | ORAL | Status: DC

## 2016-11-28 MED ORDER — ACETAMINOPHEN 325 MG PO TABS
650.0000 mg | ORAL_TABLET | ORAL | Status: DC | PRN
Start: 2016-11-28 — End: 2016-12-06

## 2016-11-28 MED ORDER — SODIUM CHLORIDE 0.9% FLUSH: 3.0000 mL | INTRAVENOUS | Status: DC | PRN

## 2016-11-28 MED ORDER — PANTOPRAZOLE SODIUM 40 MG PO TBEC
40.0000 mg | DELAYED_RELEASE_TABLET | Freq: Every day | ORAL | Status: DC
  Administered 2016-11-29 – 2016-12-06 (×8): 40 mg via ORAL
  Filled 2016-11-28 (×8): qty 1

## 2016-11-28 MED ORDER — POTASSIUM CHLORIDE CRYS ER 20 MEQ PO TBCR
40.0000 meq | EXTENDED_RELEASE_TABLET | Freq: Once | ORAL | Status: AC
  Administered 2016-11-28: 40 meq via ORAL
  Filled 2016-11-28: qty 2

## 2016-11-28 MED ORDER — WARFARIN - PHARMACIST DOSING INPATIENT
Freq: Every day | Status: DC
  Administered 2016-12-06: 18:00:00

## 2016-11-28 MED ORDER — ONDANSETRON HCL 4 MG/2ML IJ SOLN: 4.0000 mg | Freq: Four times a day (QID) | INTRAMUSCULAR | Status: DC | PRN

## 2016-11-28 NOTE — ED Provider Notes (Signed)
[facility]Y DEPT Provider Note   CSN: [id number] Arrival date & time: 11/28/16  1438     History   Chief Complaint Chief Complaint  Patient presents with  . Fall  . Fatigue    HPI Misty Gray is a 65 y.o. female.  65yo F w/ extensive PMH including CHF, A fib on coumadin, pacemaker, CVA who p/w sleepiness. History obtained primarily from family members and limited due to the patient's somnolence. They report that the patient has had increased confusion and lethargy since yesterday. She had a fall 4 days ago for which she was evaluated in the ED and discharged home after reassuring evaluation. Plain films of her back were negative for injury but she continues to have low back pain. They have noticed that she seems sleepier than normal and has been laying around which is abnormal for her. She has had recent weight gain, now 232 from 213 on 11/14/16. He has had some mild shortness of breath but denies any severe respiratory symptoms. No nausea, vomiting, diarrhea, fevers, chills, headache, or blurry vision. She was evaluated at PCP office today and sent here due to concerns for her somnolence.   The history is provided by the patient, medical records and a relative.  Fall     Past Medical History:  Diagnosis Date  . Abnormal CT of the head 12/16/1984   R parietal atrophy  . Allergic rhinitis, cause unspecified   . Anemia   . Arthritis    "hands and knees" (09/06/2015)  . Atrial fibrillation (HCC)   . Cellulitis 09/07/2015  . Cellulitis and abscess of leg 09/2016   bilateral  . CHF (congestive heart failure) (HCC)   . Diaphragmatic hernia without mention of obstruction or gangrene   . Dyspnea   . Exertional dyspnea 06/22/12  . Family history of adverse reaction to anesthesia    "daughter fights w/them; just can't relax during OR; stay awake during the OR"  . Female stress incontinence   . GERD (gastroesophageal reflux disease)   . Grand mal seizure (HCC)   . H/O hiatal  hernia    two removed  . Heart murmur   . High cholesterol   . History of blood transfusion 1956   "related to nose bleed"  . Hypertension   . Influenza A 01/11/2014  . Lichenification and lichen simplex chronicus   . Migraine    "when I was a teenager"  . Morbid obesity (HCC)   . Nonischemic cardiomyopathy (HCC)    EF has normalized-repeat Pending   . On home oxygen therapy    "2L when I'm asleep in bed" (09/06/2015)  . OSA on CPAP   . Pacemaker  st judes    Patient states "this is my third pacemaker"  . Pneumonia 2004; 2011; 11/2011  . Seizures (HCC) since 1981   "can hear you talking but sounds like you are in big tunnel; have them often if not taking RX; last one was 06/17/12" (09/06/2015)  . Shortness of breath 10/24/2010   Qualifier: Diagnosis of  By: [place], Bonnie    . Sick sinus syndrome (HCC)    WITH PRIOR DDD PACEMAKER IMPLANTATION  . Stroke [facility]) 1988   "mouth drawed real bad on my left side; found out it was a seizure"  . Syncope and collapse 06/22/12   "hit forehead and left knee"; denies loss of consciousness  . Unspecified venous (peripheral) insufficiency     Patient Active Problem List   Diagnosis Date Noted  .  Altered mental status 11/28/2016  . Acute exacerbation of CHF (congestive heart failure) (HCC) 11/28/2016  . Housing problems 11/23/2016  . Chronic a-fib (HCC) 11/01/2016  . Hypotension 10/31/2016  . Special screening for malignant neoplasms, colon 08/12/2016  . Heme positive stool 08/09/2016  . Intertrigo 08/09/2016  . Chronic anticoagulation 04/26/2016  . Chronic venous insufficiency   . Preventative health care 10/05/2014  . Breast mass, left 10/05/2014  . Osteopenia 09/15/2014  . Hypertension 01/04/2014  . Chronic systolic dysfunction of left ventricle 05/05/2013  . At high risk for falls 02/11/2013  . Vitamin D deficiency 07/15/2012  . Persistent atrial fibrillation (HCC) 06/14/2012  . Sinoatrial node dysfunction (HCC) 05/08/2011  . ALLERGIC  RHINITIS 05/31/2008  . Morbid obesity (HCC) 02/12/2007  . GASTROESOPHAGEAL REFLUX, NO ESOPHAGITIS 02/12/2007  . Female stress incontinence 02/12/2007  . Seizure disorder (HCC) 02/12/2007  . Sleep apnea 02/12/2007    Past Surgical History:  Procedure Laterality Date  . APPENDECTOMY    . CARDIAC CATHETERIZATION N/A 10/03/2016   Procedure: Right/Left Heart Cath and Coronary Angiography;  Surgeon: Dolores Pattyaniel R [name], MD;  [facility];  Service: Cardiovascular;  Laterality: N/A;  . HERNIA REPAIR  ? 1988; 04/15/1998  . INSERT / REPLACE / REMOVE PACEMAKER  12/16/1990   DDD EF 30%;  Marland Kitchen. INSERT / REPLACE / REMOVE PACEMAKER  07/25/2003   replaced by BB, leads are both IS1 and from 1992  . INSERT / REPLACE / REMOVE PACEMAKER  05/21/2011   JA; generator change  . LEG SKIN LESION  BIOPSY / EXCISION  05/16/2001   Lichen Planus  . VENTRAL HERNIA REPAIR  1989; 04/15/1998    OB History    No data available       Home Medications    Prior to Admission medications   Medication Sig Start Date End Date Taking? Authorizing Provider  amiodarone (PACERONE) 200 MG tablet Take 1 tablet (200 mg total) by mouth 2 (two) times daily. 10/24/16   Dolores Pattyaniel R [name], MD  [facility] VITAMIN D3 1000 units capsule TAKE ONE CAPSULE BY MOUTH EVERY MORNING Patient taking differently: TAKE ONE CAPSULE (1000 units) BY MOUTH EVERY MORNING 08/12/16   Uvaldo RisingKyle J Fletke, MD  fluticasone (FLONASE) 50 MCG/ACT nasal spray Place 2 sprays into both nostrils daily as needed. For congestion. 04/21/15   Renee A Kuneff, DO  losartan (COZAAR) 25 MG tablet Take 0.5 tablets (12.5 mg total) by mouth daily. 10/22/16   Dolores Pattyaniel R [name], MD  nystatin (MYCOSTATIN/NYSTOP) powder Apply topically 4 (four) times daily. 10/31/16   Uvaldo RisingKyle J Fletke, MD  omeprazole (PRILOSEC) 20 MG capsule Take 1 capsule (20 mg total) by mouth daily. 11/13/16   Uvaldo RisingKyle J Fletke, MD  OXYGEN Inhale 2 L into the lungs at bedtime. cpap machine    Historical Provider, MD    PHENobarbital (LUMINAL) 32.4 MG tablet Take 2 tablets (64.8 mg total) by mouth 2 (two) times daily. 11/14/16   Uvaldo RisingKyle J Fletke, MD  spironolactone (ALDACTONE) 25 MG tablet Take 0.5 tablets (12.5 mg total) by mouth daily. 11/01/16   Graciella FreerMichael Andrew Tillery, PA-C  torsemide (DEMADEX) 20 MG tablet Take 1 tablet (20 mg total) by mouth 2 (two) times daily. 10/22/16   Dolores Pattyaniel R [name], MD  warfarin (COUMADIN) 5 MG tablet Take 2 tablets (10 mg total) by mouth one time only at 6 PM. Patient taking differently: Take 5 mg by mouth one time only at 6 PM.  10/09/16   Uvaldo RisingKyle J Fletke, MD  Family History Family History  Problem Relation Age of Onset  . Stroke Father     died from CAD?  Marland Kitchen Heart disease Father   . Cancer Mother   . Diabetes Son     Type 1  . Sleep apnea Son   . Cancer Sister     cervical  . Hypertension Sister   . Hypertension Brother   . Rheum arthritis Daughter     Also PGM, PGGM    Social History Social History  Substance Use Topics  . Smoking status: Former Smoker    Packs/day: 1.00    Years: 7.00    Types: Cigarettes    Quit date: 03/16/1969  . Smokeless tobacco: Never Used  . Alcohol use No     Allergies   Aspirin; Penicillins; Tape; Wool alcohol [lanolin]; Amoxicillin; and Ampicillin   Review of Systems Review of Systems  Unable to perform ROS: Mental status change     Physical Exam Updated Vital Signs BP 104/81   Pulse 71   Temp 98.4 F (36.9 C) (Oral)   Resp 16   SpO2 100%   Physical Exam  Constitutional: She appears well-developed and well-nourished. No distress.  Sleepy but arousable  HENT:  Head: Normocephalic and atraumatic.  Eyes: Conjunctivae are normal. Pupils are equal, round, and reactive to light.  Neck: Neck supple.  Cardiovascular: Normal rate, regular rhythm and normal heart sounds.   No murmur heard. Pulmonary/Chest: Effort normal.  Faint crackles in bilateral bases, diminished breath sounds bilaterally  Abdominal: Soft. Bowel  sounds are normal. She exhibits no distension. There is no tenderness.  Musculoskeletal: She exhibits edema (2+ pitting edema BLE).  Neurological: She is alert.  Sleepy but arousable to voice, moving all 4 extremities, mild generalized weakness without asymmetric extremity weakness  Skin: Skin is warm and dry.  Mild erythema BLE ankles to knees  Nursing note and vitals reviewed.    ED Treatments / Results  Labs (all labs ordered are listed, but only abnormal results are displayed) Labs Reviewed  COMPREHENSIVE METABOLIC PANEL - Abnormal; Notable for the following:       Result Value   Potassium 3.3 (*)    Glucose, Bld 109 (*)    Creatinine, Ser 1.12 (*)    Total Bilirubin 1.6 (*)    GFR calc non Af Amer 50 (*)    GFR calc Af Amer 58 (*)    All other components within normal limits  CBC - Abnormal; Notable for the following:    RDW 17.8 (*)    Platelets 98 (*)    All other components within normal limits  URINALYSIS, ROUTINE W REFLEX MICROSCOPIC - Abnormal; Notable for the following:    Hgb urine dipstick SMALL (*)    Leukocytes, UA TRACE (*)    Bacteria, UA RARE (*)    Squamous Epithelial / LPF 0-5 (*)    All other components within normal limits  PROTIME-INR - Abnormal; Notable for the following:    Prothrombin Time 25.5 (*)    All other components within normal limits  BRAIN NATRIURETIC PEPTIDE - Abnormal; Notable for the following:    B Natriuretic Peptide 909.9 (*)    All other components within normal limits  I-STAT VENOUS BLOOD GAS, ED - Abnormal; Notable for the following:    pH, Ven 7.439 (*)    pCO2, Ven 39.9 (*)    pO2, Ven 25.0 (*)    Acid-Base Excess 3.0 (*)    All other components within  normal limits  CBG MONITORING, ED  I-STAT CG4 LACTIC ACID, ED    EKG  EKG Interpretation None       Radiology Dg Chest 2 View  Result Date: 11/28/2016 CLINICAL DATA:  Initial evaluation for acute altered mental status. EXAM: CHEST  2 VIEW COMPARISON:  Prior  radiograph from 09/30/2016. FINDINGS: Raynelle Fanning right-sided transvenous pacemaker/AICD in place, stable. Moderate cardiomegaly is unchanged. Mediastinal silhouette within normal limits. Lungs normally inflated. Vascular congestion with diffuse interstitial prominence, suggesting pulmonary interstitial edema. Left pleural effusion. No definite right effusion. Superimposed left basilar opacity favored to reflect atelectasis. Mild atelectatic changes at the right lung base as well. No definite focal infiltrates. No acute osseous abnormality. Accentuation of the normal thoracic kyphosis noted. IMPRESSION: 1. Cardiomegaly with mild pulmonary interstitial edema and small left pleural effusion, compatible with acute CHF exacerbation. 2. Superimposed mild bibasilar atelectasis, left greater than right. Electronically Signed   By: Rise Mu M.D.   On: 11/28/2016 19:34   Ct Head Wo Contrast  Result Date: 11/28/2016 CLINICAL DATA:  Initial evaluation for acute trauma, fall. EXAM: CT HEAD WITHOUT CONTRAST TECHNIQUE: Contiguous axial images were obtained from the base of the skull through the vertex without intravenous contrast. COMPARISON:  Prior CT from 05/25/2015. FINDINGS: Brain: Generalized cerebral atrophy with chronic microvascular ischemic disease. Encephalomalacia within the right frontal, parieto-occipital region, and left occipital lobe compatible with remote ischemic infarcts. The no acute intracranial hemorrhage. No evidence for acute large vessel territory infarct. No mass lesion, midline shift, or mass effect. No hydrocephalus. No extra-axial fluid collection. Vascular: No hyperdense vessel. Skull: Scalp soft tissues demonstrate no acute abnormality. Calvarium intact. Sinuses/Orbits: Globes and orbital soft tissues demonstrate no acute abnormality. Mild mucosal thickening within the right maxillary sinus and ethmoidal air cells. Paranasal sinuses are otherwise clear. No mastoid effusion. IMPRESSION: 1.  No acute intracranial process identified. 2. Atrophy with chronic microvascular ischemic disease and multiple remote infarcts as above, stable from previous. Electronically Signed   By: Rise Mu M.D.   On: 11/28/2016 18:55    Procedures Procedures (including critical care time)  Medications Ordered in ED Medications  furosemide (LASIX) injection 40 mg (40 mg Intravenous Given 11/28/16 2035)     Initial Impression / Assessment and Plan / ED Course  I have reviewed the triage vital signs and the nursing notes.  Pertinent labs & imaging results that were available during my care of the patient were reviewed by me and considered in my medical decision making (see chart for details).  Clinical Course    Pt p/w increased sleepiness and fatigue since yesterday per family. They also note recent weight gain. She was sleepy but arousable on my examination, stable vital signs. Faint crackles and lower extremity edema suggestive of volume overload. Chest x-ray shows interstitial edema consistent with CHF exacerbation and BNP is elevated at 900. UA negative for infection. Head CT negative for acute process. VBG negative for hypercarbia to explain patient's somnolence. CBC shows normal WBC count and given no significant infectious symptoms I doubt infectious process as explanation for her somnolence. Gave the patient IV Lasix and discussed admission with family medicine and pt admitted to Dr. Cyndia Skeeters service for further care.  Final Clinical Impressions(s) / ED Diagnoses   Final diagnoses:  Acute on chronic congestive heart failure, unspecified congestive heart failure type (HCC)  Fatigue, unspecified type    New Prescriptions New Prescriptions   No medications on file     Laurence Spates, MD 11/29/16  0003  

## 2016-11-28 NOTE — Assessment & Plan Note (Addendum)
Patient is here with new altered mental status. Patient experienced a fall 4 days ago. Mental status changes noted 24-48 hours ago. Bibasilar crackles on exam. Pulse oximetry 99%. Etiology currently unknown however due to patient's risk factors for bleeding due to warfarin use I cannot rule out intracranial bleed. Initially hypotension was considered however after discussion with PCP patient's blood pressure is improved from her most recent baseline. UTI is a possibility. CHF exacerbation is also a possibility with bibasilar crackles however pulse ox is 99%. Consider hypercarbia. - Considered directed metastases however after discussion with Dr. Leveda Anna we will send directly to ED as we feel it is important to obtain a head CT to rule out intracranial bleed as soon as possible.  Of Note: Patient's weight is up to 232 pounds from 213 pounds on 11/14/16 (using the same scale)

## 2016-11-28 NOTE — Progress Notes (Signed)
   HPI  CC: Altered mental status Patient is here with altered mental status. According to patient and her son who is currently present she fell at home 4 days ago. She denies any head trauma. She was seen in the ED 2 days ago and was eventually sent home. According to the patient she has been having a difficult time staying awake and breathing since discharge. Her son states that she had been acting herself health until yesterday when he started to notice increasing somnolence and and confusion with diminished concentration.   Patient and son both endorsed excellent compliance with her medication regimen at this time.  She denies any headache, blurred vision, chest pain, nausea, vomiting, diarrhea, fever, chills.  Review of Systems    See HPI for ROS. All other systems reviewed and are negative.  CC, SH/smoking status, and VS noted  Objective: BP 98/70 (BP Location: Left Arm, Patient Position: Sitting, Cuff Size: Large)   Pulse 71   Temp 97.5 F (36.4 C) (Oral)   Ht 5\' 2"  (1.575 m)   Wt 232 lb (105.2 kg)   SpO2 99%   BMI 42.43 kg/m  Gen: NAD, alert, cooperative, somnolent HEENT: NCAT, EOMI, PERRL, MMM CV: RRR, no murmur Resp: No wheezes, nonlabored, bibasilar crackles noted Abd: SNTND, BS present, no guarding or organomegaly Ext: Una boots in place, significant peripheral edema present. Bilateral pulses intact throughout Neuro: Alert and oriented but very somnolent, speech is occasionally clear however sentences often and with mumbling, No gross deficits.  Assessment and plan:  Altered mental status Patient is here with new altered mental status. Patient experienced a fall 4 days ago. Mental status changes noted 24-48 hours ago. Bibasilar crackles on exam. Pulse oximetry 99%. Etiology currently unknown however due to patient's risk factors for bleeding due to warfarin use I cannot rule out intracranial bleed. Initially hypotension was considered however after discussion with PCP  patient's blood pressure is improved from her most recent baseline. UTI is a possibility. CHF exacerbation is also a possibility with bibasilar crackles however pulse ox is 99%. Consider hypercarbia. - Considered directed metastases however after discussion with Dr. Leveda Anna we will send directly to ED as we feel it is important to obtain a head CT to rule out intracranial bleed as soon as possible.  Of Note: Patient's weight is up to 232 pounds from 213 pounds on 11/14/16 (using the same scale)    Kathee Delton, MD,MS,  PGY3 11/28/2016 2:25 PM

## 2016-11-28 NOTE — ED Notes (Signed)
cbg was 77 

## 2016-11-28 NOTE — Patient Instructions (Signed)
Go to the emergency department

## 2016-11-28 NOTE — H&P (Signed)
Family Medicine Teaching [facility] Admission History and Physical Service Pager: [phone]  [name] Medical record number: [id number] Date of birth: 1951-05-22 Age: 65 y.o. Gender: female  Primary Care Provider: Uvaldo Rising, MD Consultants: Cardiology  Code Status: Full code  Chief Complaint: Shortness of breath  Assessment and Plan: Misty Gray is a 65 y.o. female presenting with shortness of breath. PMH is significant for HFrEF (EF 30-35%), A-fib (on Coumadin), HTN, chronic venous insufficiency.  Acute exacerbation of HFrEF:  ECHO 09/2016 with EF 30-35%. Increased SOB at rest and with exertion over past 7-10 days. She has a 2L fluid restriction at home and has been greatly exceeding this amount and also has been eating lots of fast food.  Seen in office today and noted to have a 19 pound weight gain over two weeks. BNP elevated to >900 and has bibasilar crackles and 2+ pitting edema to the thighs on exam.  Given 40mg  IV lasix in the ED.  Takes 20mg  torsemide BID daily.  CXR showed pulmonary congestion. Followed by heart failure team.  - admit to telemetry under attending Dr. Leveda Anna - EKG - AM BMP/CBC - Strict I/Os - daily weights - holding spironolactone and losartan in light of low BP, consider starting in the morning  - trend troponin - O2 therapy PRN to keep O2 sats >92%; 2L at night (baseline) - IV Lasix 40mg  daily for now - Vitals per floor protocol - Consult HF in the AM   Altered mental status:  Reportedly appearing "sleepy" in office and upon arrival to the ED.  Was AAOx3 on my exam.  Noted to have a fall recently and due to change in mental status patient had CT head in the ED that was unremarkable. UA showed non-specific findings and urine culture was ordered.  No FND.  Aside from mild hypokalemia, no electrolyte abnormlaities and VBG showed no hypercarbia. Lactic acid 1.4 upon admission.  - UDS - Neuro checks - TSH  Atrial fibrillation,  rate controlled:  On Coumadin 5 mg daily and amiodarone  INR 2.27.  Asymptomatic and rate controlled in the 70s. - Coumadin per pharmacy - Amiodarone 200 mg BID  Hypotension with history of HTN and Hypotension: 98/70 upon admission.  Holding spironolactone and losartan. BP improved to 113/74 upon arriving on the floor. - continue to monitor  Hypokalemia: 3.3 upon admission. - 40kdur x 1 - check mg2+ - AM BMP  Mild AKI: Baseline Cr appears to be about 1 and was elevated to 1.12 upon admission.   - AM BMP - continue to monitor  Chronic Venous Insufficiency: Has been treated many times for cellulitis.  Does not appear to be any active cellulitis.  - continue to monitor  Seizure disorder: Stable - cont phenobarbital 64.7mg  BID  GERD: Stable - protonix 40mg  daily  OSA:  - CPAP and 2L O2 qHS   Thrombocytopenia: Chronic, unclear in etiology. No signs of bleeding.  - monitor with labs   FEN/GI: Heart healthy diet with 2 L fluid restriction/PPI Prophylaxis: Warfarin  Disposition: Admit to telemetry under attending Dr. Leveda Anna  History of Present Illness:  Misty Gray is a 65 y.o. female presenting with altered mental status over the past 2 days.  Patient was seen in PCP office today and according to patient and her son she had reportedly fallen 4 days ago.  Denies any head trauma and was actually seen in the ED 2 days prior and was sent home. Per  PCP note, patient had been having difficult time staying awake in breathing since leaving the ED 2 days ago.  Given new onset altered mental status and recent fall, patient was sent to ED for further evaluation.  At the bedside were her son and brother and sister who all agreed that she had been having increased confusion.  Additionally she was noted to have a 19 pound weight gain over the past 2 weeks and endorsed drinking greater than her recommended daily allowance of 2 L water and has been consuming lots of fast food over the past 2 weeks.   Endorses increasing orthopnea and shortness of breath at rest and with exertion.  Denies any missed doses of medication.  Denies any chest pain, palpitations, dysuria, fevers, chills, nausea vomiting or diarrhea.    In the ED she was given 40 mg IV Lasix one time.  BMP revealed potassium to 3.3, and UA showed trace leukocytes and was otherwise unremarkable.  BNP elevated to 909, CXR showed pulmonary congestion and VBG showed no hypercarbia. CT head was negative for acute process.   Review Of Systems: Per HPI with the following additions:   ROS  Patient Active Problem List   Diagnosis Date Noted  . Altered mental status 11/28/2016  . Acute exacerbation of CHF (congestive heart failure) (HCC) 11/28/2016  . Housing problems 11/23/2016  . Chronic a-fib (HCC) 11/01/2016  . Hypotension 10/31/2016  . Special screening for malignant neoplasms, colon 08/12/2016  . Heme positive stool 08/09/2016  . Intertrigo 08/09/2016  . Chronic anticoagulation 04/26/2016  . Chronic venous insufficiency   . Preventative health care 10/05/2014  . Breast mass, left 10/05/2014  . Osteopenia 09/15/2014  . Hypertension 01/04/2014  . Chronic systolic dysfunction of left ventricle 05/05/2013  . At high risk for falls 02/11/2013  . Vitamin D deficiency 07/15/2012  . Persistent atrial fibrillation (HCC) 06/14/2012  . Sinoatrial node dysfunction (HCC) 05/08/2011  . ALLERGIC RHINITIS 05/31/2008  . Morbid obesity (HCC) 02/12/2007  . GASTROESOPHAGEAL REFLUX, NO ESOPHAGITIS 02/12/2007  . Female stress incontinence 02/12/2007  . Seizure disorder (HCC) 02/12/2007  . Sleep apnea 02/12/2007    Past Medical History: Past Medical History:  Diagnosis Date  . Abnormal CT of the head 12/16/1984   R parietal atrophy  . Allergic rhinitis, cause unspecified   . Anemia   . Arthritis    "hands and knees" (09/06/2015)  . Atrial fibrillation (HCC)   . Cellulitis 09/07/2015  . Cellulitis and abscess of leg 09/2016   bilateral   . CHF (congestive heart failure) (HCC)   . Diaphragmatic hernia without mention of obstruction or gangrene   . Dyspnea   . Exertional dyspnea 06/22/12  . Family history of adverse reaction to anesthesia    "daughter fights w/them; just can't relax during OR; stay awake during the OR"  . Female stress incontinence   . GERD (gastroesophageal reflux disease)   . Grand mal seizure (HCC)   . H/O hiatal hernia    two removed  . Heart murmur   . High cholesterol   . History of blood transfusion 1956   "related to nose bleed"  . Hypertension   . Influenza A 01/11/2014  . Lichenification and lichen simplex chronicus   . Migraine    "when I was a teenager"  . Morbid obesity (HCC)   . Nonischemic cardiomyopathy (HCC)    EF has normalized-repeat Pending   . On home oxygen therapy    "2L when I'm asleep in bed" (  09/06/2015)  . OSA on CPAP   . Pacemaker  st judes    Patient states "this is my third pacemaker"  . Pneumonia 2004; 2011; 11/2011  . Seizures (HCC) since 1981   "can hear you talking but sounds like you are in big tunnel; have them often if not taking RX; last one was 06/17/12" (09/06/2015)  . Shortness of breath 10/24/2010   Qualifier: Diagnosis of  By: [place], Bonnie    . Sick sinus syndrome (HCC)    WITH PRIOR DDD PACEMAKER IMPLANTATION  . Stroke [facility](HCC) 1988   "mouth drawed real bad on my left side; found out it was a seizure"  . Syncope and collapse 06/22/12   "hit forehead and left knee"; denies loss of consciousness  . Unspecified venous (peripheral) insufficiency     Past Surgical History: Past Surgical History:  Procedure Laterality Date  . APPENDECTOMY    . CARDIAC CATHETERIZATION N/A 10/03/2016   Procedure: Right/Left Heart Cath and Coronary Angiography;  Surgeon: Dolores Pattyaniel R [name], MD;  [facility];  Service: Cardiovascular;  Laterality: N/A;  . HERNIA REPAIR  ? 1988; 04/15/1998  . INSERT / REPLACE / REMOVE PACEMAKER  12/16/1990   DDD EF 30%;  Marland Kitchen. INSERT /  REPLACE / REMOVE PACEMAKER  07/25/2003   replaced by BB, leads are both IS1 and from 1992  . INSERT / REPLACE / REMOVE PACEMAKER  05/21/2011   JA; generator change  . LEG SKIN LESION  BIOPSY / EXCISION  05/16/2001   Lichen Planus  . VENTRAL HERNIA REPAIR  1989; 04/15/1998    Social History: Social History  Substance Use Topics  . Smoking status: Former Smoker    Packs/day: 1.00    Years: 7.00    Types: Cigarettes    Quit date: 03/16/1969  . Smokeless tobacco: Never Used  . Alcohol use No    Family History: Family History  Problem Relation Age of Onset  . Stroke Father     died from CAD?  Marland Kitchen. Heart disease Father   . Cancer Mother   . Diabetes Son     Type 1  . Sleep apnea Son   . Cancer Sister     cervical  . Hypertension Sister   . Hypertension Brother   . Rheum arthritis Daughter     Also PGM, PGGM    Allergies and Medications: Allergies  Allergen Reactions  . Aspirin Hives and Rash  . Penicillins Rash and Other (See Comments)    Amoxicillin also, Has patient had a PCN reaction causing immediate rash, facial/tongue/throat swelling, SOB or lightheadedness with hypotension: Yes Has patient had a PCN reaction causing severe rash involving mucus membranes or skin necrosis: No Has patient had a PCN reaction that required hospitalization No Has patient had a PCN reaction occurring within the last 10 years: Yes If all of the above answers are "NO", then may proceed with Cephalosporin use.   . Tape Other (See Comments)    NO PAPER TAPE-MUST BE MEDICAL TAPE  . Wool Alcohol [Lanolin] Hives  . Amoxicillin Rash  . Ampicillin Itching and Rash   No current facility-administered medications on file prior to encounter.    Current Outpatient Prescriptions on File Prior to Encounter  Medication Sig Dispense Refill  . amiodarone (PACERONE) 200 MG tablet Take 1 tablet (200 mg total) by mouth 2 (two) times daily. 60 tablet 5  . CVS VITAMIN D3 1000 units capsule TAKE ONE CAPSULE BY MOUTH  EVERY MORNING (Patient  taking differently: TAKE ONE CAPSULE (1000 units) BY MOUTH EVERY MORNING) 90 capsule 1  . fluticasone (FLONASE) 50 MCG/ACT nasal spray Place 2 sprays into both nostrils daily as needed. For congestion. 16 g 1  . losartan (COZAAR) 25 MG tablet Take 0.5 tablets (12.5 mg total) by mouth daily. 15 tablet 5  . nystatin (MYCOSTATIN/NYSTOP) powder Apply topically 4 (four) times daily. 45 g 1  . omeprazole (PRILOSEC) 20 MG capsule Take 1 capsule (20 mg total) by mouth daily. 90 capsule 1  . OXYGEN Inhale 2 L into the lungs at bedtime. cpap machine    . PHENobarbital (LUMINAL) 32.4 MG tablet Take 2 tablets (64.8 mg total) by mouth 2 (two) times daily. 120 tablet 5  . spironolactone (ALDACTONE) 25 MG tablet Take 0.5 tablets (12.5 mg total) by mouth daily. 15 tablet 3  . torsemide (DEMADEX) 20 MG tablet Take 1 tablet (20 mg total) by mouth 2 (two) times daily. 60 tablet 5  . warfarin (COUMADIN) 5 MG tablet Take 2 tablets (10 mg total) by mouth one time only at 6 PM. (Patient taking differently: Take 5 mg by mouth one time only at 6 PM. ) 100 tablet 1    Objective: BP 96/76   Pulse 69   Temp 98.4 F (36.9 C) (Oral)   Resp 16   SpO2 100%  Exam: General: 65 year old female lying down appearing comfortable Eyes: EOMI, PERRL, non-injected ENTM: mildly dry mucous membranes, clear oropharynx, dried/cracked lips Neck: supple, no LAD Cardiovascular: RRR, no MRG Respiratory: NWOB, bibasilar crackles, good air movement, no wheezing or rhonci Gastrointestinal: obese abdomen with well-healed transverse scar, soft, NTND, no peritoneal signs or guarding, +BS MSK: no deformities or edema Derm: warm and dry Neuro: CNII-X intact. Normal sensation, strength 5/5 upper and lower extremities. Normal sensation to light touch in upper and lower extremities bilaterally. AAOx3 Psych: Normal mood and affect  Labs and Imaging: CBC BMET   Recent Labs Lab 11/28/16 1454  WBC 4.2  HGB 13.3  HCT  40.2  PLT 98*    Recent Labs Lab 11/28/16 1454  NA 137  K 3.3*  CL 101  CO2 26  BUN 17  CREATININE 1.12*  GLUCOSE 109*  CALCIUM 9.4    INR 2.2 PT 25.5 T bili: 1.6 LFT: within normal limits BNP 909.9 VBG: pH 7.439, Co2 29.9, O225.0 Lactic Acid: 1.4 CT Head: no acute process CXR: mild pulmonary interstitial edema, small L pleural effusion consistent with CHF exacerbation.  Renne Musca, MD 11/28/2016, 9:36 PM PGY-1, [facility] FPTS Intern pager: [phone], text pages welcome  UPPER LEVEL ADDENDUM  I have read the above note and made revisions highlighted in blue.  Palma Holter, MD PGY-2 [facility] Pager [phone]

## 2016-11-28 NOTE — Progress Notes (Signed)
ANTICOAGULATION CONSULT NOTE - Initial Consult  Pharmacy Consult for Coumadin Indication: atrial fibrillation  Allergies  Allergen Reactions  . Aspirin Hives and Rash  . Penicillins Rash and Other (See Comments)    Amoxicillin also, Has patient had a PCN reaction causing immediate rash, facial/tongue/throat swelling, SOB or lightheadedness with hypotension: Yes Has patient had a PCN reaction causing severe rash involving mucus membranes or skin necrosis: No Has patient had a PCN reaction that required hospitalization No Has patient had a PCN reaction occurring within the last 10 years: Yes If all of the above answers are "NO", then may proceed with Cephalosporin use.   . Tape Other (See Comments)    NO PAPER TAPE-MUST BE MEDICAL TAPE  . Wool Alcohol [Lanolin] Hives  . Amoxicillin Rash  . Ampicillin Itching and Rash    Patient Measurements: Height: 5\' 2"  (157.5 cm) Weight: 224 lb 8 oz (101.8 kg) (Scale B ) IBW/kg (Calculated) : 50.1  Vital Signs: Temp: 98 F (36.7 C) (12/14 2228) Temp Source: Oral (12/14 2228) BP: 113/74 (12/14 2228) Pulse Rate: 69 (12/14 2228)  Labs:  Recent Labs  11/26/16 1405 11/28/16 1413 11/28/16 1454  HGB 12.5  --  13.3  HCT 37.2  --  40.2  PLT 104*  --  98*  LABPROT 26.8*  --  25.5*  INR 2.43 2.2 2.27  CREATININE 1.04*  --  1.12*    Estimated Creatinine Clearance: 54.5 mL/min (by C-G formula based on SCr of 1.12 mg/dL (H)).   Medical History: Past Medical History:  Diagnosis Date  . Abnormal CT of the head 12/16/1984   R parietal atrophy  . Allergic rhinitis, cause unspecified   . Anemia   . Arthritis    "hands and knees" (09/06/2015)  . Atrial fibrillation (HCC)   . Cellulitis 09/07/2015  . Cellulitis and abscess of leg 09/2016   bilateral  . CHF (congestive heart failure) (HCC)   . Diaphragmatic hernia without mention of obstruction or gangrene   . Dyspnea   . Exertional dyspnea 06/22/12  . Family history of adverse reaction to  anesthesia    "daughter fights w/them; just can't relax during OR; stay awake during the OR"  . Female stress incontinence   . GERD (gastroesophageal reflux disease)   . Grand mal seizure (HCC)   . H/O hiatal hernia    two removed  . Heart murmur   . High cholesterol   . History of blood transfusion 1956   "related to nose bleed"  . Hypertension   . Influenza A 01/11/2014  . Lichenification and lichen simplex chronicus   . Migraine    "when I was a teenager"  . Morbid obesity (HCC)   . Nonischemic cardiomyopathy (HCC)    EF has normalized-repeat Pending   . On home oxygen therapy    "2L when I'm asleep in bed" (09/06/2015)  . OSA on CPAP   . Pacemaker  st judes    Patient states "this is my third pacemaker"  . Pneumonia 2004; 2011; 11/2011  . Seizures (HCC) since 1981   "can hear you talking but sounds like you are in big tunnel; have them often if not taking RX; last one was 06/17/12" (09/06/2015)  . Shortness of breath 10/24/2010   Qualifier: Diagnosis of  By: [place], Bonnie    . Sick sinus syndrome (HCC)    WITH PRIOR DDD PACEMAKER IMPLANTATION  . Stroke [facility](HCC) 1988   "mouth drawed real bad on my left side; found  out it was a seizure"  . Syncope and collapse 06/22/12   "hit forehead and left knee"; denies loss of consciousness  . Unspecified venous (peripheral) insufficiency     Medications:  Prescriptions Prior to Admission  Medication Sig Dispense Refill Last Dose  . amiodarone (PACERONE) 200 MG tablet Take 1 tablet (200 mg total) by mouth 2 (two) times daily. 60 tablet 5 11/28/2016 at Unknown time  . CVS VITAMIN D3 1000 units capsule TAKE ONE CAPSULE BY MOUTH EVERY MORNING (Patient taking differently: TAKE ONE CAPSULE (1000 units) BY MOUTH EVERY MORNING) 90 capsule 1 11/28/2016 at Unknown time  . fluticasone (FLONASE) 50 MCG/ACT nasal spray Place 2 sprays into both nostrils daily as needed. For congestion. 16 g 1 Past Week at Unknown time  . losartan (COZAAR) 25 MG tablet  Take 0.5 tablets (12.5 mg total) by mouth daily. 15 tablet 5 11/28/2016 at Unknown time  . nystatin (MYCOSTATIN/NYSTOP) powder Apply topically 4 (four) times daily. 45 g 1 Past Week at Unknown time  . omeprazole (PRILOSEC) 20 MG capsule Take 1 capsule (20 mg total) by mouth daily. 90 capsule 1 11/28/2016 at Unknown time  . OXYGEN Inhale 2 L into the lungs at bedtime. cpap machine   unknown at unknown  . PHENobarbital (LUMINAL) 32.4 MG tablet Take 2 tablets (64.8 mg total) by mouth 2 (two) times daily. 120 tablet 5 11/28/2016 at Unknown time  . spironolactone (ALDACTONE) 25 MG tablet Take 0.5 tablets (12.5 mg total) by mouth daily. 15 tablet 3 11/28/2016 at Unknown time  . torsemide (DEMADEX) 20 MG tablet Take 1 tablet (20 mg total) by mouth 2 (two) times daily. 60 tablet 5 11/28/2016 at Unknown time  . warfarin (COUMADIN) 5 MG tablet Take 2 tablets (10 mg total) by mouth one time only at 6 PM. (Patient taking differently: Take 5 mg by mouth one time only at 6 PM. ) 100 tablet 1 11/27/2016 at 2200   Scheduled:  . amiodarone  200 mg Oral BID  . [START ON 11/29/2016] pantoprazole  40 mg Oral Daily  . PHENobarbital  64.8 mg Oral BID  . sodium chloride flush  3 mL Intravenous Q12H    Assessment: 64yo female admitted from [facility] for AMS, CT head stable from previous exam, to continue Coumadin for Afib; current INR at goal.  Goal of Therapy:  INR 2-3   Plan:  Will continue home Coumadin dose of 5mg  daily except 10mg  MTh and monitor INR.  Vernard Gambles, PharmD, BCPS  11/28/2016,11:51 PM

## 2016-11-28 NOTE — ED Triage Notes (Signed)
Pt here from [facility] c/o lethargy and lower back pain since fall on Sunday; pt was seen here after fall and denies hitting her head but is not coumadin; pt alert but sts feels tired at present

## 2016-11-29 ENCOUNTER — Inpatient Hospital Stay (HOSPITAL_COMMUNITY): Admission: RE | Admit: 2016-11-29 | Payer: Medicare Other | Source: Ambulatory Visit

## 2016-11-29 ENCOUNTER — Other Ambulatory Visit: Payer: Self-pay

## 2016-11-29 DIAGNOSIS — R400 Somnolence: Secondary | ICD-10-CM

## 2016-11-29 DIAGNOSIS — E876 Hypokalemia: Secondary | ICD-10-CM

## 2016-11-29 DIAGNOSIS — I5023 Acute on chronic systolic (congestive) heart failure: Secondary | ICD-10-CM

## 2016-11-29 LAB — RAPID URINE DRUG SCREEN, HOSP PERFORMED
AMPHETAMINES: NOT DETECTED
BENZODIAZEPINES: NOT DETECTED
Barbiturates: POSITIVE — AB
Cocaine: NOT DETECTED
Opiates: NOT DETECTED
TETRAHYDROCANNABINOL: NOT DETECTED

## 2016-11-29 LAB — CBC WITH DIFFERENTIAL/PLATELET
BASOS ABS: 0 10*3/uL (ref 0.0–0.1)
BASOS PCT: 1 %
Eosinophils Absolute: 0.1 10*3/uL (ref 0.0–0.7)
Eosinophils Relative: 3 %
HCT: 40.5 % (ref 36.0–46.0)
HEMOGLOBIN: 13.3 g/dL (ref 12.0–15.0)
LYMPHS PCT: 23 %
Lymphs Abs: 1 10*3/uL (ref 0.7–4.0)
MCH: 30.4 pg (ref 26.0–34.0)
MCHC: 32.8 g/dL (ref 30.0–36.0)
MCV: 92.7 fL (ref 78.0–100.0)
MONO ABS: 0.4 10*3/uL (ref 0.1–1.0)
MONOS PCT: 9 %
NEUTROS ABS: 2.9 10*3/uL (ref 1.7–7.7)
NEUTROS PCT: 64 %
Platelets: 108 10*3/uL — ABNORMAL LOW (ref 150–400)
RBC: 4.37 MIL/uL (ref 3.87–5.11)
RDW: 17.7 % — ABNORMAL HIGH (ref 11.5–15.5)
WBC: 4.6 10*3/uL (ref 4.0–10.5)

## 2016-11-29 LAB — CBC
HEMATOCRIT: 35.7 % — AB (ref 36.0–46.0)
HEMOGLOBIN: 11.9 g/dL — AB (ref 12.0–15.0)
MCH: 30.9 pg (ref 26.0–34.0)
MCHC: 33.3 g/dL (ref 30.0–36.0)
MCV: 92.7 fL (ref 78.0–100.0)
Platelets: 85 10*3/uL — ABNORMAL LOW (ref 150–400)
RBC: 3.85 MIL/uL — AB (ref 3.87–5.11)
RDW: 17.8 % — ABNORMAL HIGH (ref 11.5–15.5)
WBC: 3.3 10*3/uL — ABNORMAL LOW (ref 4.0–10.5)

## 2016-11-29 LAB — TROPONIN I: Troponin I: <0.03 ng/mL (ref <0.03)

## 2016-11-29 LAB — PROTIME-INR
INR: 2.34
PROTHROMBIN TIME: 26 s — AB (ref 11.4–15.2)

## 2016-11-29 LAB — PHENOBARBITAL LEVEL: Phenobarbital: 32.8 ug/mL — ABNORMAL HIGH (ref 15.0–30.0)

## 2016-11-29 LAB — BASIC METABOLIC PANEL
Anion gap: 5 (ref 5–15)
BUN: 15 mg/dL (ref 6–20)
CHLORIDE: 102 mmol/L (ref 101–111)
CO2: 32 mmol/L (ref 22–32)
Calcium: 9 mg/dL (ref 8.9–10.3)
Creatinine, Ser: 1.05 mg/dL — ABNORMAL HIGH (ref 0.44–1.00)
GFR calc Af Amer: >60 mL/min (ref >60)
GFR calc non Af Amer: 54 mL/min — ABNORMAL LOW (ref >60)
GLUCOSE: 101 mg/dL — AB (ref 65–99)
POTASSIUM: 3.1 mmol/L — AB (ref 3.5–5.1)
Sodium: 139 mmol/L (ref 135–145)

## 2016-11-29 LAB — RETICULOCYTES
RBC.: 4.37 MIL/uL (ref 3.87–5.11)
Retic Count, Absolute: 35 10*3/uL (ref 19.0–186.0)
Retic Ct Pct: 0.8 % (ref 0.4–3.1)

## 2016-11-29 LAB — TSH: TSH: 1.765 u[IU]/mL (ref 0.350–4.500)

## 2016-11-29 LAB — MAGNESIUM: Magnesium: 2 mg/dL (ref 1.7–2.4)

## 2016-11-29 MED ORDER — POTASSIUM CHLORIDE CRYS ER 20 MEQ PO TBCR
40.0000 meq | EXTENDED_RELEASE_TABLET | Freq: Three times a day (TID) | ORAL | Status: AC
  Administered 2016-11-29 (×3): 40 meq via ORAL
  Filled 2016-11-29 (×3): qty 2

## 2016-11-29 MED ORDER — FUROSEMIDE 10 MG/ML IJ SOLN: 40.0000 mg | Freq: Every day | INTRAMUSCULAR | Status: DC

## 2016-11-29 MED ORDER — SPIRONOLACTONE 25 MG PO TABS
12.5000 mg | ORAL_TABLET | Freq: Every day | ORAL | Status: DC
  Administered 2016-11-29 – 2016-12-02 (×4): 12.5 mg via ORAL
  Filled 2016-11-29 (×5): qty 1

## 2016-11-29 MED ORDER — FUROSEMIDE 10 MG/ML IJ SOLN
40.0000 mg | Freq: Two times a day (BID) | INTRAMUSCULAR | Status: DC
  Administered 2016-11-29: 40 mg via INTRAVENOUS
  Filled 2016-11-29: qty 4

## 2016-11-29 MED ORDER — FUROSEMIDE 10 MG/ML IJ SOLN
80.0000 mg | Freq: Two times a day (BID) | INTRAMUSCULAR | Status: DC
  Administered 2016-11-29 – 2016-12-05 (×12): 80 mg via INTRAVENOUS
  Filled 2016-11-29 (×12): qty 8

## 2016-11-29 MED ORDER — FUROSEMIDE 10 MG/ML IJ SOLN: 40.0000 mg | Freq: Three times a day (TID) | INTRAMUSCULAR | Status: DC

## 2016-11-29 MED ORDER — FUROSEMIDE 10 MG/ML IJ SOLN
80.0000 mg | Freq: Once | INTRAMUSCULAR | Status: AC
  Administered 2016-11-29: 80 mg via INTRAVENOUS
  Filled 2016-11-29: qty 8

## 2016-11-29 NOTE — Progress Notes (Signed)
Family Medicine Teaching Service Daily Progress Note Intern Pager: [phone]  [name] Medical record number: [id number] Date of birth: 1951-10-05 Age: 66 y.o. Gender: female  Primary Care Provider: Uvaldo Rising, MD Consultants: None Code Status: Full Code  Pt Overview and Major Events to Date:    Assessment and Plan: Misty Gray is a 65 y.o. female presenting with shortness of breath. PMH is significant for HFrEF (EF 30-35%), A-fib (on Coumadin), HTN, chronic venous insufficiency.  Acute exacerbation of HFrEF:  ECHO 09/2016 with EF 30-35%. Increased SOB at rest and with exertion over past 7-10 days. She has a 2L fluid restriction at home and has been greatly exceeding this amount and also has been eating lots of fast food.  Seen in office today and noted to have a 19 pound weight gain over two weeks. BNP elevated to >900 and has bibasilar crackles and 2+ pitting edema to the thighs on exam.  Given 40mg  IV lasix in the ED.   Takes 20mg  torsemide BID daily.  CXR showed pulmonary congestion.  UOP: 1.45L and weight down 2 pounds.  K+ down to 3.1 this AM.   -  IV Lasix 40mg  TID - heart failure team consulted; follow up recommendations - repleted K with kdur TID this AM - Strict I/Os and weights - holding spironolactone and losartan in light of low BP, consider starting in the morning  - O2 therapy PRN to keep O2 sats >92%; 2L at night (baseline)  Altered mental status:  Reportedly appearing "sleepy" in office and upon arrival to the ED.  Was AAOx3 on my exam.  Noted to have a fall recently and due to change in mental status patient had CT head in the ED that was unremarkable. UA showed non-specific findings and urine culture was ordered.  No FND.  Aside from mild hypokalemia, no electrolyte abnormlaities and VBG showed no hypercarbia. Lactic acid 1.4 upon admission.  TSH to 1.765.  UDS+ for barbiturates.  - f/u phenobarb levels - Neuro checks  Atrial fibrillation,  rate controlled:  On Coumadin 5 mg daily and amiodarone  INR 2.27.  Asymptomatic and rate controlled in the 70s. - Coumadin per pharmacy - Amiodarone 200 mg BID-- followed by Cardiology  Hypotension with history of HTN and Hypotension: 98/70 upon admission.  Holding spironolactone and losartan. BP this AM was 103/72.  - continue to monitor - continue to hold spironolactone and losartan  Hypokalemia: 3.3 upon admission, 3.1 this AM.  Magnesium 2.0 - 40kdur x3 once today - AM BMP  Mild AKI, improving: Baseline Cr appears to be about 1 and was elevated to 1.12 upon admission improved to 1.05 this morning.  - AM BMP - continue to monitor  Chronic Venous Insufficiency: Has been treated many times for cellulitis.  Does not appear to be any active cellulitis.  - continue to monitor  Seizure disorder: Stable - cont phenobarbital 64.7mg  BID - f/u phenobarb level  GERD: Stable - protonix 40mg  daily  OSA:  - CPAP and 2L O2 qHS   Pancytopenia:  Could be explained by phenobarbital use.  - diff add on to this AM CBC - reticulocyte count and blood smear - to 90 morrow AM CBC   FEN/GI: Heart healthy diet with 2 L fluid restriction/PPI Prophylaxis: Warfarin per pharmacy, INR appropriate  Disposition: Admit to telemetry under attending Dr. Leveda Anna  Subjective:  Feels improved this morning. Denies SOB, CP, palpitations. Denies NVD.   Objective: Temp:  [97.5 F (  36.4 C)-98.4 F (36.9 C)] 97.5 F (36.4 C) (12/15 0707) Pulse Rate:  [69-71] 70 (12/15 0707) Resp:  [16-18] 18 (12/15 0707) BP: (96-117)/(61-81) 103/72 (12/15 0707) SpO2:  [94 %-100 %] 94 % (12/15 0707) Weight:  [222 lb 6.4 oz (100.9 kg)-232 lb (105.2 kg)] 222 lb 6.4 oz (100.9 kg) (12/15 4166) Physical Exam: General: 65 year old female lying down appearing comfortable Eyes: EOMI, PERRL, non-injected ENTM: mildly dry mucous membranes, clear oropharynx, dried/cracked lips Neck: supple, no LAD Cardiovascular: RRR,  no MRG Respiratory: NWOB, bibasilar crackles, good air movement, no wheezing or rhonci Gastrointestinal: obese abdomen with well-healed transverse scar, soft, NTND, no peritoneal signs or guarding, non-palpable spleen, +BS MSK: no deformities or edema Derm: warm and dry Neuro: CNII-X intact. Normal sensation, strength 5/5 upper and lower extremities. Normal sensation to light touch in upper and lower extremities bilaterally. AAOx3 Psych: Normal mood and affect  Laboratory:  Recent Labs Lab 11/26/16 1405 11/28/16 1454 11/29/16 0527  WBC 4.5 4.2 3.3*  HGB 12.5 13.3 11.9*  HCT 37.2 40.2 35.7*  PLT 104* 98* 85*    Recent Labs Lab 11/26/16 1405 11/28/16 1454 11/29/16 0527  NA 137 137 139  K 3.2* 3.3* 3.1*  CL 102 101 102  CO2 26 26 32  BUN 13 17 15   CREATININE 1.04* 1.12* 1.05*  CALCIUM 9.2 9.4 9.0  PROT  --  7.7  --   BILITOT  --  1.6*  --   ALKPHOS  --  105  --   ALT  --  17  --   AST  --  24  --   GLUCOSE 99 109* 101*    Imaging/Diagnostic Tests: Dg Chest 2 View  Result Date: 11/28/2016 CLINICAL DATA:  Initial evaluation for acute altered mental status. EXAM: CHEST  2 VIEW COMPARISON:  Prior radiograph from 09/30/2016. FINDINGS: Raynelle Fanning right-sided transvenous pacemaker/AICD in place, stable. Moderate cardiomegaly is unchanged. Mediastinal silhouette within normal limits. Lungs normally inflated. Vascular congestion with diffuse interstitial prominence, suggesting pulmonary interstitial edema. Left pleural effusion. No definite right effusion. Superimposed left basilar opacity favored to reflect atelectasis. Mild atelectatic changes at the right lung base as well. No definite focal infiltrates. No acute osseous abnormality. Accentuation of the normal thoracic kyphosis noted. IMPRESSION: 1. Cardiomegaly with mild pulmonary interstitial edema and small left pleural effusion, compatible with acute CHF exacerbation. 2. Superimposed mild bibasilar atelectasis, left greater than  right. Electronically Signed   By: Rise Mu M.D.   On: 11/28/2016 19:34   Ct Head Wo Contrast  Result Date: 11/28/2016 CLINICAL DATA:  Initial evaluation for acute trauma, fall. EXAM: CT HEAD WITHOUT CONTRAST TECHNIQUE: Contiguous axial images were obtained from the base of the skull through the vertex without intravenous contrast. COMPARISON:  Prior CT from 05/25/2015. FINDINGS: Brain: Generalized cerebral atrophy with chronic microvascular ischemic disease. Encephalomalacia within the right frontal, parieto-occipital region, and left occipital lobe compatible with remote ischemic infarcts. The no acute intracranial hemorrhage. No evidence for acute large vessel territory infarct. No mass lesion, midline shift, or mass effect. No hydrocephalus. No extra-axial fluid collection. Vascular: No hyperdense vessel. Skull: Scalp soft tissues demonstrate no acute abnormality. Calvarium intact. Sinuses/Orbits: Globes and orbital soft tissues demonstrate no acute abnormality. Mild mucosal thickening within the right maxillary sinus and ethmoidal air cells. Paranasal sinuses are otherwise clear. No mastoid effusion. IMPRESSION: 1. No acute intracranial process identified. 2. Atrophy with chronic microvascular ischemic disease and multiple remote infarcts as above, stable from previous. Electronically Signed  By: Rise MuBenjamin  McClintock M.D.   On: 11/28/2016 18:55    Renne Muscaaniel L Guadalupe Nickless, MD 11/29/2016, 8:02 AM PGY-1, [facility] FPTS Intern pager: [phone], text pages welcome

## 2016-11-29 NOTE — Progress Notes (Signed)
Patient arrived to unit, walked from bed in hallway to the bed in the room, no complaints. Cardiac monitoring applied, CCMD called. Patient has an undocumented right AC IV. Patient oriented to unit, educated on plan of care, family visited.

## 2016-11-29 NOTE — Progress Notes (Signed)
ANTICOAGULATION CONSULT NOTE - Initial Consult  Pharmacy Consult for Coumadin Indication: atrial fibrillation  Allergies  Allergen Reactions  . Aspirin Hives and Rash  . Penicillins Rash and Other (See Comments)    Amoxicillin also, Has patient had a PCN reaction causing immediate rash, facial/tongue/throat swelling, SOB or lightheadedness with hypotension: Yes Has patient had a PCN reaction causing severe rash involving mucus membranes or skin necrosis: No Has patient had a PCN reaction that required hospitalization No Has patient had a PCN reaction occurring within the last 10 years: Yes If all of the above answers are "NO", then may proceed with Cephalosporin use.   . Tape Other (See Comments)    NO PAPER TAPE-MUST BE MEDICAL TAPE  . Wool Alcohol [Lanolin] Hives  . Amoxicillin Rash  . Ampicillin Itching and Rash    Patient Measurements: Height: 5\' 2"  (157.5 cm) Weight: 222 lb 6.4 oz (100.9 kg) (Sclae B) IBW/kg (Calculated) : 50.1  Vital Signs: Temp: 97.5 F (36.4 C) (12/15 0707) Temp Source: Oral (12/15 0707) BP: 108/53 (12/15 1000) Pulse Rate: 71 (12/15 1000)  Labs:  Recent Labs  11/26/16 1405 11/28/16 1413 11/28/16 1454 11/28/16 2333 11/29/16 0527  HGB 12.5  --  13.3  --  11.9*  HCT 37.2  --  40.2  --  35.7*  PLT 104*  --  98*  --  85*  LABPROT 26.8*  --  25.5*  --  26.0*  INR 2.43 2.2 2.27  --  2.34  CREATININE 1.04*  --  1.12*  --  1.05*  TROPONINI  --   --   --  <0.03 <0.03    Estimated Creatinine Clearance: 57.8 mL/min (by C-G formula based on SCr of 1.05 mg/dL (H)).   Medical History: Past Medical History:  Diagnosis Date  . Abnormal CT of the head 12/16/1984   R parietal atrophy  . Allergic rhinitis, cause unspecified   . Anemia   . Arthritis    "hands and knees" (09/06/2015)  . Atrial fibrillation (HCC)   . Cellulitis 09/07/2015  . Cellulitis and abscess of leg 09/2016   bilateral  . CHF (congestive heart failure) (HCC)   . Diaphragmatic  hernia without mention of obstruction or gangrene   . Dyspnea   . Exertional dyspnea 06/22/12  . Family history of adverse reaction to anesthesia    "daughter fights w/them; just can't relax during OR; stay awake during the OR"  . Female stress incontinence   . GERD (gastroesophageal reflux disease)   . Grand mal seizure (HCC)   . H/O hiatal hernia    two removed  . Heart murmur   . High cholesterol   . History of blood transfusion 1956   "related to nose bleed"  . Hypertension   . Influenza A 01/11/2014  . Lichenification and lichen simplex chronicus   . Migraine    "when I was a teenager"  . Morbid obesity (HCC)   . Nonischemic cardiomyopathy (HCC)    EF has normalized-repeat Pending   . On home oxygen therapy    "2L when I'm asleep in bed" (09/06/2015)  . OSA on CPAP   . Pacemaker  st judes    Patient states "this is my third pacemaker"  . Pneumonia 2004; 2011; 11/2011  . Seizures (HCC) since 1981   "can hear you talking but sounds like you are in big tunnel; have them often if not taking RX; last one was 06/17/12" (09/06/2015)  . Shortness of breath 10/24/2010  Qualifier: Diagnosis of  By: [place], Bonnie    . Sick sinus syndrome (HCC)    WITH PRIOR DDD PACEMAKER IMPLANTATION  . Stroke [facility](HCC) 1988   "mouth drawed real bad on my left side; found out it was a seizure"  . Syncope and collapse 06/22/12   "hit forehead and left knee"; denies loss of consciousness  . Unspecified venous (peripheral) insufficiency     Medications:  Prescriptions Prior to Admission  Medication Sig Dispense Refill Last Dose  . amiodarone (PACERONE) 200 MG tablet Take 1 tablet (200 mg total) by mouth 2 (two) times daily. 60 tablet 5 11/28/2016 at Unknown time  . [employer] VITAMIN D3 1000 units capsule TAKE ONE CAPSULE BY MOUTH EVERY MORNING (Patient taking differently: TAKE ONE CAPSULE (1000 units) BY MOUTH EVERY MORNING) 90 capsule 1 11/28/2016 at Unknown time  . fluticasone (FLONASE) 50 MCG/ACT nasal spray  Place 2 sprays into both nostrils daily as needed. For congestion. 16 g 1 Past Week at Unknown time  . losartan (COZAAR) 25 MG tablet Take 0.5 tablets (12.5 mg total) by mouth daily. 15 tablet 5 11/28/2016 at Unknown time  . nystatin (MYCOSTATIN/NYSTOP) powder Apply topically 4 (four) times daily. 45 g 1 Past Week at Unknown time  . omeprazole (PRILOSEC) 20 MG capsule Take 1 capsule (20 mg total) by mouth daily. 90 capsule 1 11/28/2016 at Unknown time  . OXYGEN Inhale 2 L into the lungs at bedtime. cpap machine   unknown at unknown  . PHENobarbital (LUMINAL) 32.4 MG tablet Take 2 tablets (64.8 mg total) by mouth 2 (two) times daily. 120 tablet 5 11/28/2016 at Unknown time  . spironolactone (ALDACTONE) 25 MG tablet Take 0.5 tablets (12.5 mg total) by mouth daily. 15 tablet 3 11/28/2016 at Unknown time  . torsemide (DEMADEX) 20 MG tablet Take 1 tablet (20 mg total) by mouth 2 (two) times daily. 60 tablet 5 11/28/2016 at Unknown time  . warfarin (COUMADIN) 5 MG tablet Take 2 tablets (10 mg total) by mouth one time only at 6 PM. (Patient taking differently: Take 5 mg by mouth one time only at 6 PM. ) 100 tablet 1 11/27/2016 at 2200   Scheduled:  . amiodarone  200 mg Oral BID  . furosemide  40 mg Intravenous TID  . pantoprazole  40 mg Oral Daily  . PHENobarbital  64.8 mg Oral BID  . potassium chloride  40 mEq Oral TID  . sodium chloride flush  3 mL Intravenous Q12H  . [START ON 12/02/2016] warfarin  10 mg Oral Once per day on Mon Thu  . warfarin  5 mg Oral Once per day on Sun Tue Wed Fri Sat  . Warfarin - Pharmacist Dosing Inpatient   Does not apply q1800    Assessment: 65[facility], CT head stable from previous exam. Also had shortness of breath in setting of heart failure (19 lb weight gain over past 2 weeks).   INR remains stable and therapeutic on home dose at 2.34. No new drug interactions noted while inpatient (phenobarbital and amiodarone both PTA, effects on  INR should be realized). Patient's hemoglobin dropped slightly from yesterday and platelets remain low/stable. Will monitor this closely.   Goal of Therapy:  INR 2-3   Plan:  Will continue home warfarin dose of 5mg  daily except 10mg  MTh Daily INRs Monitor for signs/symptoms of bleeding  Carylon PerchesMaggie Diandra Cimini, PharmD Acute Care Pharmacy Resident  Pager: [phone] 11/29/2016

## 2016-11-29 NOTE — Procedures (Signed)
Placed patient on BIPAP auto titrate IPAP max 25, EPAP min 5 for the night.  Patient is tolerating well at this time.

## 2016-11-29 NOTE — Evaluation (Signed)
Physical Therapy Evaluation Patient Details Name: TRINICE BREON MRN: [id number] DOB: [birth year] Today's Date: 11/29/2016   History of Present Illness  65 y.o. female presenting with shortness of breath. PMH is significant for HFrEF (EF 30-35%), A-fib (on Coumadin), HTN, chronic venous insufficiency. AMS.   Clinical Impression  Patient seen for mobility evaluation. At this time patient demonstrates deficits in functional mobility as indicated below. Will benefit from continued skilled PT to address deficits and maximize function. Will see as indicated and progress as tolerated. Recommend HHPT upon acute discharge. VSS throughout session with saturations 98% at rest and 93% after activity.    Follow Up Recommendations Home health PT;Supervision for mobility/OOB    Equipment Recommendations  None recommended by PT    Recommendations for Other Services       Precautions / Restrictions Precautions Precautions: Fall Restrictions Weight Bearing Restrictions: No      Mobility  Bed Mobility               General bed mobility comments: received up in chair  Transfers Overall transfer level: Needs assistance Equipment used: Rolling walker (2 wheeled) Transfers: Sit to/from Stand Sit to Stand: Min guard         General transfer comment: Vcs for hand placement and safety  Ambulation/Gait Ambulation/Gait assistance: Min guard (one episode of min assist due to LOB) Ambulation Distance (Feet): 140 Feet Assistive device: Rolling walker (2 wheeled) Gait Pattern/deviations: Step-through pattern;Decreased stride length;Wide base of support;Trunk flexed;Drifts right/left Gait velocity: decreased Gait velocity interpretation: Below normal speed for age/gender General Gait Details: Vcs for posture and positioning with use of RW. Patient with slow gait speed and some instability noted.   Stairs            Wheelchair Mobility    Modified Rankin (Stroke Patients Only)        Balance Overall balance assessment: History of Falls                                           Pertinent Vitals/Pain Pain Assessment: No/denies pain    Home Living Family/patient expects to be discharged to:: Private residence Living Arrangements: Children;Spouse/significant other Available Help at Discharge: Family;Available 24 hours/day Type of Home: House Home Access: Level entry     Home Layout: One level Home Equipment: Walker - 2 wheels;Cane - single point      Prior Function Level of Independence: Independent with assistive device(s)         Comments: nurse helps with medication management     Hand Dominance   Dominant Hand: Right    Extremity/Trunk Assessment   Upper Extremity Assessment Upper Extremity Assessment: Generalized weakness    Lower Extremity Assessment Lower Extremity Assessment: Generalized weakness    Cervical / Trunk Assessment Cervical / Trunk Assessment:  (increased body habitus)  Communication   Communication: No difficulties  Cognition Arousal/Alertness: Awake/alert Behavior During Therapy: WFL for tasks assessed/performed Overall Cognitive Status: No family/caregiver present to determine baseline cognitive functioning                      General Comments  Educated on pursed lip breathing during activity. VSS throughout session.    Exercises  Educated on AROm for LEs   Assessment/Plan    PT Assessment Patient needs continued PT services  PT Problem List Decreased strength;Decreased activity tolerance;Decreased balance;Decreased mobility;Decreased safety  awareness;Obesity          PT Treatment Interventions DME instruction;Gait training;Functional mobility training;Therapeutic activities;Therapeutic exercise;Balance training;Patient/family education    PT Goals (Current goals can be found in the Care Plan section)  Acute Rehab PT Goals Patient Stated Goal: to get better PT Goal  Formulation: With patient Time For Goal Achievement: 12/13/16 Potential to Achieve Goals: Good    Frequency Min 3X/week   Barriers to discharge        Co-evaluation               End of Session Equipment Utilized During Treatment: Gait belt Activity Tolerance: Patient tolerated treatment well Patient left: in chair;with call bell/phone within reach Nurse Communication: Mobility status         Time: [id number] PT Time Calculation (min) (ACUTE ONLY): 17 min   Charges:   PT Evaluation $PT Eval Moderate Complexity: 1 Procedure     PT G Codes:        Fabio AsaDevon J Saydie Gerdts 11/29/2016, 12:07 PM Charlotte Crumbevon Harshith Pursell, PT DPT  [phone]

## 2016-11-29 NOTE — Consult Note (Signed)
Advanced Heart Failure Team Consult Note  Referring Physician: Dr Leveda Anna  Primary Physician: Primary Cardiologist:  Dr Gala Romney   Reason for Consultation: Heart Failure   HPI:   65 y/o woman with history of morbid obesity, OSA, chronic atrial fibrillation with tachy-brady syndrome s/p PPM, frequent PVCs, systolic HF due to NICM.  Admitted for ADHF in 10/17. Cath with normal coronary arteries and elevated filling pressures. Echo 10/17 EF 30-35% (previously 35-40%). 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 (She has 81% RV pacing and 15-20 PVCs per minute). Started on po amio with suppression of PVCs. Diuresed 41 pounds. Discharge weight 197.   Followed in the HF clinic. She was last seen November 17th. Continued on torsemide 20 mg twice a day. Weight at that time was 201 pounds.   Prior to admit drinking >2 liters of fluid and eating fast food.   Admitted with increased dyspnea, somnolence, and generalized weakness. Volume overloaded. Weight 224 lbs. Diuresed with 40 mg IV lasix three times a day. Spiro and losartan held due to soft BP. CXR - interstitial edema and small left pleural effusion. Pertinent admission lab: K 3.3, Na 137, Creatinine 1.12, Hgb 13, WBC 4.2 , BNP 909, lactic acid 1.4, TSH 1.7,  Troponin 0.03, and Mag 2.0. UA- negative.   ECHO 09/2016 EF 30-35% RV moderately dilated Peak PA pressure 68 mm hg  R/LHC 10/03/16 Ao = 94/58 (72) LV = 104/20 RA = 21 RV = 65/4/19 PA = 65/21 (38) PCW = 23 Fick cardiac output/index = 6.1/2.9 PVR = 2.5 WU FA sat = 97%  PA sat = 69%, 70% Assessment: 1. Normal coronary arteries 2. NICM with EF 30-35% by echo 3. Persistently elevated biventricular pressures Review of Systems: [y] = yes, [ ]  = no   General: Weight gain [Y ]; Weight loss [ ] ; Anorexia [ ] ; Fatigue [Y ]; Fever [ ] ; Chills [ ] ; Weakness [Y ]  Cardiac: Chest pain/pressure [ ] ; Resting SOB [ ] ; Exertional SOB [Y ]; Orthopnea [Y ]; Pedal Edema [ Y]; Palpitations [ ] ;  Syncope [ ] ; Presyncope [ ] ; Paroxysmal nocturnal dyspnea[ ]   Pulmonary: Cough [ ] ; Wheezing[ ] ; Hemoptysis[ ] ; Sputum [ ] ; Snoring [ ]   GI: Vomiting[ ] ; Dysphagia[ ] ; Melena[ ] ; Hematochezia [ ] ; Heartburn[ ] ; Abdominal pain [ ] ; Constipation [ ] ; Diarrhea [ ] ; BRBPR [ ]   GU: Hematuria[ ] ; Dysuria [ ] ; Nocturia[ ]   Vascular: Pain in legs with walking [ ] ; Pain in feet with lying flat [ ] ; Non-healing sores [ ] ; Stroke [ ] ; TIA [ ] ; Slurred speech [ ] ;  Neuro: Headaches[ ] ; Vertigo[ ] ; Seizures[ ] ; Paresthesias[ ] ;Blurred vision [ ] ; Diplopia [ ] ; Vision changes [ ]   Ortho/Skin: Arthritis [ ] ; Joint pain [ ] ; Muscle pain [ ] ; Joint swelling [ ] ; Back Pain [ ] ; Rash [ ]   Psych: Depression[ ] ; Anxiety[ ]   Heme: Bleeding problems [ ] ; Clotting disorders [ ] ; Anemia [ ]   Endocrine: Diabetes [ ] ; Thyroid dysfunction[ ]   Home Medications Prior to Admission medications   Medication Sig Start Date End Date Taking? Authorizing Provider  amiodarone (PACERONE) 200 MG tablet Take 1 tablet (200 mg total) by mouth 2 (two) times daily. 10/24/16  Yes Dolores Patty, MD  [employer] VITAMIN D3 1000 units capsule TAKE ONE CAPSULE BY MOUTH EVERY MORNING Patient taking differently: TAKE ONE CAPSULE (1000 units) BY MOUTH EVERY MORNING 08/12/16  Yes Uvaldo Rising, MD  fluticasone (FLONASE) 50 MCG/ACT nasal spray  Place 2 sprays into both nostrils daily as needed. For congestion. 04/21/15  Yes Renee A Kuneff, DO  losartan (COZAAR) 25 MG tablet Take 0.5 tablets (12.5 mg total) by mouth daily. 10/22/16  Yes [name] [name], MD  nystatin (MYCOSTATIN/NYSTOP) powder Apply topically 4 (four) times daily. 10/31/16  Yes Uvaldo Rising, MD  omeprazole (PRILOSEC) 20 MG capsule Take 1 capsule (20 mg total) by mouth daily. 11/13/16  Yes Uvaldo Rising, MD  OXYGEN Inhale 2 L into the lungs at bedtime. cpap machine   Yes Historical Provider, MD  PHENobarbital (LUMINAL) 32.4 MG tablet Take 2 tablets (64.8 mg total) by mouth 2 (two) times  daily. 11/14/16  Yes Uvaldo Rising, MD  spironolactone (ALDACTONE) 25 MG tablet Take 0.5 tablets (12.5 mg total) by mouth daily. 11/01/16  Yes Graciella Freer, PA-C  torsemide (DEMADEX) 20 MG tablet Take 1 tablet (20 mg total) by mouth 2 (two) times daily. 10/22/16  Yes Dolores Patty, MD  warfarin (COUMADIN) 5 MG tablet Take 2 tablets (10 mg total) by mouth one time only at 6 PM. Patient taking differently: Take 5 mg by mouth one time only at 6 PM.  10/09/16  Yes Uvaldo Rising, MD    Past Medical History: Past Medical History:  Diagnosis Date  . Abnormal CT of the head 12/16/1984   R parietal atrophy  . Allergic rhinitis, cause unspecified   . Anemia   . Arthritis    "hands and knees" (09/06/2015)  . Atrial fibrillation (HCC)   . Cellulitis 09/07/2015  . Cellulitis and abscess of leg 09/2016   bilateral  . CHF (congestive heart failure) (HCC)   . Diaphragmatic hernia without mention of obstruction or gangrene   . Dyspnea   . Exertional dyspnea 06/22/12  . Family history of adverse reaction to anesthesia    "daughter fights w/them; just can't relax during OR; stay awake during the OR"  . Female stress incontinence   . GERD (gastroesophageal reflux disease)   . Grand mal seizure (HCC)   . H/O hiatal hernia    two removed  . Heart murmur   . High cholesterol   . History of blood transfusion 1956   "related to nose bleed"  . Hypertension   . Influenza A 01/11/2014  . Lichenification and lichen simplex chronicus   . Migraine    "when I was a teenager"  . Morbid obesity (HCC)   . Nonischemic cardiomyopathy (HCC)    EF has normalized-repeat Pending   . On home oxygen therapy    "2L when I'm asleep in bed" (09/06/2015)  . OSA on CPAP   . Pacemaker  st judes    Patient states "this is my third pacemaker"  . Pneumonia 2004; 2011; 11/2011  . Seizures (HCC) since 1981   "can hear you talking but sounds like you are in big tunnel; have them often if not taking RX; last one was  06/17/12" (09/06/2015)  . Shortness of breath 10/24/2010   Qualifier: Diagnosis of  By: [place], Bonnie    . Sick sinus syndrome (HCC)    WITH PRIOR DDD PACEMAKER IMPLANTATION  . Stroke [facility]) 1988   "mouth drawed real bad on my left side; found out it was a seizure"  . Syncope and collapse 06/22/12   "hit forehead and left knee"; denies loss of consciousness  . Unspecified venous (peripheral) insufficiency     Past Surgical History: Past Surgical History:  Procedure Laterality Date  .  APPENDECTOMY    . CARDIAC CATHETERIZATION N/A 10/03/2016   Procedure: Right/Left Heart Cath and Coronary Angiography;  Surgeon: Dolores Pattyaniel R [name], MD;  [facility];  Service: Cardiovascular;  Laterality: N/A;  . HERNIA REPAIR  ? 1988; 04/15/1998  . INSERT / REPLACE / REMOVE PACEMAKER  12/16/1990   DDD EF 30%;  Marland Kitchen. INSERT / REPLACE / REMOVE PACEMAKER  07/25/2003   replaced by BB, leads are both IS1 and from 1992  . INSERT / REPLACE / REMOVE PACEMAKER  05/21/2011   JA; generator change  . LEG SKIN LESION  BIOPSY / EXCISION  05/16/2001   Lichen Planus  . VENTRAL HERNIA REPAIR  1989; 04/15/1998    Family History: Family History  Problem Relation Age of Onset  . Stroke Father     died from CAD?  Marland Kitchen. Heart disease Father   . Cancer Mother   . Diabetes Son     Type 1  . Sleep apnea Son   . Cancer Sister     cervical  . Hypertension Sister   . Hypertension Brother   . Rheum arthritis Daughter     Also PGM, PGGM    Social History: Social History   Social History  . Marital status: Married    Spouse name: [name]  . Number of children: 4  . Years of education: 12   Occupational History  . disabled    Social History Main Topics  . Smoking status: Former Smoker    Packs/day: 1.00    Years: 7.00    Types: Cigarettes    Quit date: 03/16/1969  . Smokeless tobacco: Never Used  . Alcohol use No  . Drug use: No  . Sexual activity: Not Currently    Birth control/ protection: Post-menopausal    Other Topics Concern  . None   Social History Narrative   Lives with husband Doristine LocksSteven   Son - Dorothey BasemanJames Allen lives with them as do his 2 children   Daughter, Steward DroneBrenda, and her 3 children live [place]   Son -  [name] is incarcerated   Nephew, Clelia CroftDavid Geron lives with them   [place]    [name]   Moved June 2013 [address]      Lives with husband, son, grandson, granddaughter, nephew.    Allergies:  Allergies  Allergen Reactions  . Aspirin Hives and Rash  . Penicillins Rash and Other (See Comments)    Amoxicillin also, Has patient had a PCN reaction causing immediate rash, facial/tongue/throat swelling, SOB or lightheadedness with hypotension: Yes Has patient had a PCN reaction causing severe rash involving mucus membranes or skin necrosis: No Has patient had a PCN reaction that required hospitalization No Has patient had a PCN reaction occurring within the last 10 years: Yes If all of the above answers are "NO", then may proceed with Cephalosporin use.   . Tape Other (See Comments)    NO PAPER TAPE-MUST BE MEDICAL TAPE  . Wool Alcohol [Lanolin] Hives  . Amoxicillin Rash  . Ampicillin Itching and Rash    Objective:    Vital Signs:   Temp:  [97.5 F (36.4 C)-98.4 F (36.9 C)] 97.5 F (36.4 C) (12/15 0707) Pulse Rate:  [69-71] 71 (12/15 1000) Resp:  [16-18] 18 (12/15 1000) BP: (96-117)/(48-81) 108/53 (12/15 1000) SpO2:  [94 %-100 %] 100 % (12/15 1000) Weight:  [222 lb 6.4 oz (100.9 kg)-224 lb 8 oz (101.8 kg)]  222 lb 6.4 oz (100.9 kg) (12/15 0707) Last BM Date: 11/28/16  Weight change: Filed Weights   11/28/16 2228 11/29/16 0707  Weight: 224 lb 8 oz (101.8 kg) 222 lb 6.4 oz (100.9 kg)    Intake/Output:   Intake/Output Summary (Last 24 hours) at 11/29/16 1234 Last data filed at 11/29/16 2633  Gross per 24 hour  Intake              920 ml  Output             1450 ml  Net              -530 ml     Physical Exam: General:  Well appearing. No resp difficulty. In the chair HEENT: normal Neck: supple. JVP to jaw  Carotids 2+ bilat; no bruits. No lymphadenopathy or thryomegaly appreciated. Cor: PMI nondisplaced. Irregular rate & rhythm. No rubs, gallops or murmurs. Lungs: clear Abdomen:obese,  soft, nontender, nondistended. No hepatosplenomegaly. No bruits or masses. Good bowel sounds. Extremities: no cyanosis, clubbing, rash, R and LLE 3+ edema Neuro: alert & orientedx3, cranial nerves grossly intact. moves all 4 extremities w/o difficulty. Affect pleasant  Telemetry: A Fib Vpacing.   Labs: Basic Metabolic Panel:  Recent Labs Lab 11/26/16 1405 11/28/16 1454 11/28/16 2333 11/29/16 0527  NA 137 137  --  139  K 3.2* 3.3*  --  3.1*  CL 102 101  --  102  CO2 26 26  --  32  GLUCOSE 99 109*  --  101*  BUN 13 17  --  15  CREATININE 1.04* 1.12*  --  1.05*  CALCIUM 9.2 9.4  --  9.0  MG  --   --  2.0  --     Liver Function Tests:  Recent Labs Lab 11/28/16 1454  AST 24  ALT 17  ALKPHOS 105  BILITOT 1.6*  PROT 7.7  ALBUMIN 3.5   No results for input(s): LIPASE, AMYLASE in the last 168 hours. No results for input(s): AMMONIA in the last 168 hours.  CBC:  Recent Labs Lab 11/26/16 1405 11/28/16 1454 11/29/16 0527 11/29/16 1051  WBC 4.5 4.2 3.3* 4.6  NEUTROABS  --   --   --  2.9  HGB 12.5 13.3 11.9* 13.3  HCT 37.2 40.2 35.7* 40.5  MCV 92.1 93.3 92.7 92.7  PLT 104* 98* 85* 108*    Cardiac Enzymes:  Recent Labs Lab 11/28/16 2333 11/29/16 0527  TROPONINI <0.03 <0.03    BNP: BNP (last 3 results)  Recent Labs  09/15/16 1124 09/30/16 1606 11/28/16 1858  BNP 1,140.6* 927.9* 909.9*    ProBNP (last 3 results) No results for input(s): PROBNP in the last 8760 hours.   CBG:  Recent Labs Lab 11/28/16 1506  GLUCAP 77    Coagulation Studies:  Recent Labs  11/26/16 1405 11/28/16 1413 11/28/16 1454 11/29/16 0527  LABPROT 26.8*  --   25.5* 26.0*  INR 2.43 2.2 2.27 2.34    Other results: EKG:   Imaging: Dg Chest 2 View  Result Date: 11/28/2016 CLINICAL DATA:  Initial evaluation for acute altered mental status. EXAM: CHEST  2 VIEW COMPARISON:  Prior radiograph from 09/30/2016. FINDINGS: Raynelle Fanning right-sided transvenous pacemaker/AICD in place, stable. Moderate cardiomegaly is unchanged. Mediastinal silhouette within normal limits. Lungs normally inflated. Vascular congestion with diffuse interstitial prominence, suggesting pulmonary interstitial edema. Left pleural effusion. No definite right effusion. Superimposed left basilar opacity favored to reflect atelectasis. Mild atelectatic changes at the right lung base  as well. No definite focal infiltrates. No acute osseous abnormality. Accentuation of the normal thoracic kyphosis noted. IMPRESSION: 1. Cardiomegaly with mild pulmonary interstitial edema and small left pleural effusion, compatible with acute CHF exacerbation. 2. Superimposed mild bibasilar atelectasis, left greater than right. Electronically Signed   By: Rise Mu M.D.   On: 11/28/2016 19:34   Ct Head Wo Contrast  Result Date: 11/28/2016 CLINICAL DATA:  Initial evaluation for acute trauma, fall. EXAM: CT HEAD WITHOUT CONTRAST TECHNIQUE: Contiguous axial images were obtained from the base of the skull through the vertex without intravenous contrast. COMPARISON:  Prior CT from 05/25/2015. FINDINGS: Brain: Generalized cerebral atrophy with chronic microvascular ischemic disease. Encephalomalacia within the right frontal, parieto-occipital region, and left occipital lobe compatible with remote ischemic infarcts. The no acute intracranial hemorrhage. No evidence for acute large vessel territory infarct. No mass lesion, midline shift, or mass effect. No hydrocephalus. No extra-axial fluid collection. Vascular: No hyperdense vessel. Skull: Scalp soft tissues demonstrate no acute abnormality. Calvarium intact.  Sinuses/Orbits: Globes and orbital soft tissues demonstrate no acute abnormality. Mild mucosal thickening within the right maxillary sinus and ethmoidal air cells. Paranasal sinuses are otherwise clear. No mastoid effusion. IMPRESSION: 1. No acute intracranial process identified. 2. Atrophy with chronic microvascular ischemic disease and multiple remote infarcts as above, stable from previous. Electronically Signed   By: Rise Mu M.D.   On: 11/28/2016 18:55      Medications:     Current Medications: . amiodarone  200 mg Oral BID  . furosemide  40 mg Intravenous TID  . pantoprazole  40 mg Oral Daily  . PHENobarbital  64.8 mg Oral BID  . potassium chloride  40 mEq Oral TID  . sodium chloride flush  3 mL Intravenous Q12H  . [START ON 12/02/2016] warfarin  10 mg Oral Once per day on Mon Thu  . warfarin  5 mg Oral Once per day on Sun Tue Wed Fri Sat  . Warfarin - Pharmacist Dosing Inpatient   Does not apply q1800     Infusions:    Assessment:   1. A/C Biventricular Heart Failure- NICM. LHC norm cors 09/2016. ECHO 09/2016 EF 30-35% RV moderately dilated.  2. Frequent PVCs -possible PVC cardiomyopathy. Started on amio for PVC suppression.  3. Chronic A fib 4. Hyperlipidemia 5. Hypokalemia 6. Obesity    Plan/Discussion:    Mrs Balin is a 65 year old admitted with marked volume over load in the setting of noncompliance with fluid and diet. Well known to the HF team. Biventricular HF ECHO 09/2016 EF 30-35% Moderately dilated RV.   Increase lasix to 80 mg twice a day. Add 12.5 mg spiro . Renal function stable. No bb/arb with soft bp. Renal function stable. Weight up 20 pounds. Add unna boots. Follow renal function closely.   Rate controlled. INR 2.34   Followed by HF Paramedicine.    Length of Stay: 1  [name] NP-C  11/29/2016, 12:34 PM  Advanced Heart Failure Team Pager [phone] (M-F; 7a - 4p)  Please contact [facility] for night-coverage after hours  (4p -7a ) and weekends on [email or web address]  Patient seen and examined with Tonye Becket, NP. We discussed all aspects of the encounter. I agree with the assessment and plan as stated above.   65 y/o woman as above with NICM (suspect possible PVC cardiomyopathy) admitted with recurrent HF and marked volume overload in setting of dietary noncompliance. Increase lasix to 80 IV bid. Add spiro. Long discussion about need  for compliance. Unfortunately she has very poor insight into her HF. Previous dry weight 197 pounds.   [name], Daniel,MD 3:38 PM

## 2016-11-29 NOTE — Discharge Instructions (Addendum)
You were admitted for a heart failure exacerbation. Please make sure to take medications as prescribed at your discharge from the hospital. Please make sure to follow up with your  Primary care physician. Make sure to watch your daily weights when you go home.     Heart Failure Heart failure means your heart has trouble pumping blood. This makes it hard for your body to work well. Heart failure is usually a long-term (chronic) condition. You must take good care of yourself and follow your doctor's treatment plan. HOME CARE  Take your heart medicine as told by your doctor.  Do not stop taking medicine unless your doctor tells you to.  Do not skip any dose of medicine.  Refill your medicines before they run out.  Take other medicines only as told by your doctor or pharmacist.  Stay active if told by your doctor. The elderly and people with severe heart failure should talk with a doctor about physical activity.  Eat heart-healthy foods. Choose foods that are without trans fat and are low in saturated fat, cholesterol, and salt (sodium). This includes fresh or frozen fruits and vegetables, fish, lean meats, fat-free or low-fat dairy foods, whole grains, and high-fiber foods. Lentils and dried peas and beans (legumes) are also good choices.  Limit salt if told by your doctor.  Cook in a healthy way. Roast, grill, broil, bake, poach, steam, or stir-fry foods.  Limit fluids as told by your doctor.  Weigh yourself every morning. Do this after you pee (urinate) and before you eat breakfast. Write down your weight to give to your doctor.  Take your blood pressure and write it down if your doctor tells you to.  Ask your doctor how to check your pulse. Check your pulse as told.  Lose weight if told by your doctor.  Stop smoking or chewing tobacco. Do not use gum or patches that help you quit without your doctor's approval.  Schedule and go to doctor visits as told.  Nonpregnant women  should have no more than 1 drink a day. Men should have no more than 2 drinks a day. Talk to your doctor about drinking alcohol.  Stop illegal drug use.  Stay current with shots (immunizations).  Manage your health conditions as told by your doctor.  Learn to manage your stress.  Rest when you are tired.  If it is really hot outside:  Avoid intense activities.  Use air conditioning or fans, or get in a cooler place.  Avoid caffeine and alcohol.  Wear loose-fitting, lightweight, and light-colored clothing.  If it is really cold outside:  Avoid intense activities.  Layer your clothing.  Wear mittens or gloves, a hat, and a scarf when going outside.  Avoid alcohol.  Learn about heart failure and get support as needed.  Get help to maintain or improve your quality of life and your ability to care for yourself as needed. GET HELP IF:   You gain weight quickly.  You are more short of breath than usual.  You cannot do your normal activities.  You tire easily.  You cough more than normal, especially with activity.  You have any or more puffiness (swelling) in areas such as your hands, feet, ankles, or belly (abdomen).  You cannot sleep because it is hard to breathe.  You feel like your heart is beating fast (palpitations).  You get dizzy or light-headed when you stand up. GET HELP RIGHT AWAY IF:   You have trouble breathing.  There is a change in mental status, such as becoming less alert or not being able to focus.  You have chest pain or discomfort.  You faint. MAKE SURE YOU:   Understand these instructions.  Will watch your condition.  Will get help right away if you are not doing well or get worse. This information is not intended to replace advice given to you by your health care provider. Make sure you discuss any questions you have with your health care provider. Document Released: 09/10/2008 Document Revised: 12/23/2014 Document Reviewed:  01/18/2013 Elsevier Interactive Patient Education  2017 [employer]   Information on my medicine - Coumadin   (Warfarin)  This medication education was reviewed with me or my healthcare representative as part of my discharge preparation.  The pharmacist that spoke with me during my hospital stay was:  Mahala MenghiniMargaret E Shuda, [facility]  Why was Coumadin prescribed for you? Coumadin was prescribed for you because you have a blood clot or a medical condition that can cause an increased risk of forming blood clots. Blood clots can cause serious health problems by blocking the flow of blood to the heart, lung, or brain. Coumadin can prevent harmful blood clots from forming. As a reminder your indication for Coumadin is:   Stroke Prevention Because Of Atrial Fibrillation  What test will check on my response to Coumadin? While on Coumadin (warfarin) you will need to have an INR test regularly to ensure that your dose is keeping you in the desired range. The INR (international normalized ratio) number is calculated from the result of the laboratory test called prothrombin time (PT).  If an INR APPOINTMENT HAS NOT ALREADY BEEN MADE FOR YOU please schedule an appointment to have this lab work done by your health care provider within 7 days. Your INR goal is usually a number between:  2 to 3 or your provider may give you a more narrow range like 2-2.5.  Ask your health care provider during an office visit what your goal INR is.  What  do you need to  know  About  COUMADIN? Take Coumadin (warfarin) exactly as prescribed by your healthcare provider about the same time each day.  DO NOT stop taking without talking to the doctor who prescribed the medication.  Stopping without other blood clot prevention medication to take the place of Coumadin may increase your risk of developing a new clot or stroke.  Get refills before you run out.  What do you do if you miss a dose? If you miss a dose, take it as soon as you  remember on the same day then continue your regularly scheduled regimen the next day.  Do not take two doses of Coumadin at the same time.  Important Safety Information A possible side effect of Coumadin (Warfarin) is an increased risk of bleeding. You should call your healthcare provider right away if you experience any of the following: ? Bleeding from an injury or your nose that does not stop. ? Unusual colored urine (red or dark brown) or unusual colored stools (red or black). ? Unusual bruising for unknown reasons. ? A serious fall or if you hit your head (even if there is no bleeding).  Some foods or medicines interact with Coumadin (warfarin) and might alter your response to warfarin. To help avoid this: ? Eat a balanced diet, maintaining a consistent amount of Vitamin K. ? Notify your provider about major diet changes you plan to make. ? Avoid alcohol or limit  your intake to 1 drink for women and 2 drinks for men per day. (1 drink is 5 oz. wine, 12 oz. beer, or 1.5 oz. liquor.)  Make sure that ANY health care provider who prescribes medication for you knows that you are taking Coumadin (warfarin).  Also make sure the healthcare provider who is monitoring your Coumadin knows when you have started a new medication including herbals and non-prescription products.  Coumadin (Warfarin)  Major Drug Interactions  Increased Warfarin Effect Decreased Warfarin Effect  Alcohol (large quantities) Antibiotics (esp. Septra/Bactrim, Flagyl, Cipro) Amiodarone (Cordarone) Aspirin (ASA) Cimetidine (Tagamet) Megestrol (Megace) NSAIDs (ibuprofen, naproxen, etc.) Piroxicam (Feldene) Propafenone (Rythmol SR) Propranolol (Inderal) Isoniazid (INH) Posaconazole (Noxafil) Barbiturates (Phenobarbital) Carbamazepine (Tegretol) Chlordiazepoxide (Librium) Cholestyramine (Questran) Griseofulvin Oral Contraceptives Rifampin Sucralfate (Carafate) Vitamin K   Coumadin (Warfarin) Major Herbal  Interactions  Increased Warfarin Effect Decreased Warfarin Effect  Garlic Ginseng Ginkgo biloba Coenzyme Q10 Green tea St. Johns wort    Coumadin (Warfarin) FOOD Interactions  Eat a consistent number of servings per week of foods HIGH in Vitamin K (1 serving =  cup)  Collards (cooked, or boiled & drained) Kale (cooked, or boiled & drained) Mustard greens (cooked, or boiled & drained) Parsley *serving size only =  cup Spinach (cooked, or boiled & drained) Swiss chard (cooked, or boiled & drained) Turnip greens (cooked, or boiled & drained)  Eat a consistent number of servings per week of foods MEDIUM-HIGH in Vitamin K (1 serving = 1 cup)  Asparagus (cooked, or boiled & drained) Broccoli (cooked, boiled & drained, or raw & chopped) Brussel sprouts (cooked, or boiled & drained) *serving size only =  cup Lettuce, raw (green leaf, endive, romaine) Spinach, raw Turnip greens, raw & chopped   These websites have more information on Coumadin (warfarin):  http://www.king-russell.com/; https://www.hines.net/;
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
## 2016-11-29 NOTE — Discharge Summary (Signed)
Family Medicine Teaching [facility] Discharge Summary  [name] Medical record number: [id number] Date of birth: 13-May-1951 Age: 65 y.o. Gender: female Date of Admission: 11/28/2016  Date of Discharge: 12/06/2016 Admitting Physician: Moses Manners, MD  Primary Care Provider: Uvaldo Rising, MD Consultants: Cardiology  Indication for Hospitalization: Heart failure exacerbation  Discharge Diagnoses/Problem List:  Active Problems:   Acute on chronic congestive heart failure (HCC)   Hypokalemia   Fall   Somnolence   Frequent PVCs   Urinary tract infection without hematuria  Disposition: home  Discharge Condition: stable  Discharge Exam:  General: 65 year old female in chair with legs elevated, resting comfortable ENTM: Moist Mucous Membranes Cardiovascular: irregularly irregular rhythm, regular rate Respiratory: NWOB, faint bibasilar crackles, no wheezing or rhonchi, no increased work of breathing Gastrointestinal: obese abdomen with well-healed transverse scar, soft, NTND Ext: 1+ edema below the knee bilaterally, erythema secondary to venous stasis Neuro:  AAOx3 Psych: Normal mood and affect  Brief Hospital Course:  66 year old female presented to the emergency department with altered mental status x 2 days. At [facility] office visit on day of admission, patient mentioned fall 4 days prior. Patient sent to emergency department for further evaluation given inability to rule out intracranial bleed. CT head showed no acute changes.  Noted to have a 19 pound weight gain over the past 2 weeks secondary to poor diet and greater fluid intake than recommended (directed to have 2L fluid restriction and low salt diet). Patient with increased orthopnea and shortness of breath on admission. BNP was elevated at 909 and chest x-ray showed pulmonary congestion. Heart failure team was consulted and patient was diuresed on 80 mg IV twice a day for a number of days during  admission and was brought closer to her dry weight. Patient was started on metolazone 5 mg which was eventually increased to twice a day. Spironolactone was increased to 25 mg daily and her losartan was held during admission due to low blood pressures.  Patient also found to have pansensitive Escherichia coli UTI, for which she completed a 6 Day course of Keflex. Patient denied any urinary symptoms at discharge.   Issues for Follow Up:  1. Monitor fluid status. Patient likely has component of venous stasis edema, expect she will have some edema persistently despite adequate diuresis.  2. Recheck BMP.  3. Please consider decreasing phenobarbital as patient endorses h/o falls and it can cause CNS depression. 4. Cardiology: Please see Dr. Traci Sermon note on day of discharge (echo was performed and not read prior to discharge) "EF 15-20% despite PVC suppression. I am concerned that worsening LV function may be related to RV pacing. Will need to refer to EP to consider CRT upgrade."  Significant Procedures: none  Significant Labs and Imaging:   Recent Labs Lab 12/04/16 0452 12/05/16 0340 12/06/16 0524  WBC 4.1 4.2 3.7*  HGB 13.3 12.1 12.3  HCT 39.8 36.9 37.5  PLT 103* 102* 109*    Recent Labs Lab 12/02/16 0551 12/03/16 0400 12/04/16 0452 12/05/16 0340 12/06/16 0524  NA 137 138 135 135 135  K 3.9 3.0* 3.7 3.4* 3.5  CL 101 96* 93* 90* 94*  CO2 26 31 31  35* 32  GLUCOSE 98 99 88 95 94  BUN 21* 22* 26* 30* 32*  CREATININE 0.91 0.98 1.15* 1.24* 1.05*  CALCIUM 9.5 9.4 9.8 9.8 9.8  MG  --  2.0  --   --   --     Results/Tests Pending  at Time of Discharge: none  Discharge Medications:  Allergies as of 12/06/2016      Reactions   Aspirin Hives, Rash   Penicillins Rash, Other (See Comments)   Amoxicillin also, Has patient had a PCN reaction causing immediate rash, facial/tongue/throat swelling, SOB or lightheadedness with hypotension: Yes Has patient had a PCN reaction causing severe  rash involving mucus membranes or skin necrosis: No Has patient had a PCN reaction that required hospitalization No Has patient had a PCN reaction occurring within the last 10 years: Yes If all of the above answers are "NO", then may proceed with Cephalosporin use.   Tape Other (See Comments)   NO PAPER TAPE-MUST BE MEDICAL TAPE   Wool Alcohol [lanolin] Hives   Amoxicillin Rash   Ampicillin Itching, Rash      Medication List    STOP taking these medications   losartan 25 MG tablet Commonly known as:  COZAAR   nystatin powder Commonly known as:  MYCOSTATIN/NYSTOP     TAKE these medications   amiodarone 200 MG tablet Commonly known as:  PACERONE Take 1 tablet (200 mg total) by mouth 2 (two) times daily.   [employer] VITAMIN D3 1000 units capsule Generic drug:  Cholecalciferol TAKE ONE CAPSULE BY MOUTH EVERY MORNING What changed:  See the new instructions.   fluticasone 50 MCG/ACT nasal spray Commonly known as:  FLONASE Place 2 sprays into both nostrils daily as needed. For congestion.   omeprazole 20 MG capsule Commonly known as:  PRILOSEC Take 1 capsule (20 mg total) by mouth daily.   OXYGEN Inhale 2 L into the lungs at bedtime. cpap machine   PHENobarbital 32.4 MG tablet Commonly known as:  LUMINAL Take 2 tablets (64.8 mg total) by mouth 2 (two) times daily.   potassium chloride SA 20 MEQ tablet Commonly known as:  K-DUR,KLOR-CON Take 2 tablets (40 mEq total) by mouth daily.   spironolactone 25 MG tablet Commonly known as:  ALDACTONE Take 1 tablet (25 mg total) by mouth daily. What changed:  how much to take   torsemide 20 MG tablet Commonly known as:  DEMADEX Take 2 tablets in the AM, 1 tablet in the PM What changed:  how much to take  how to take this  when to take this  additional instructions   warfarin 5 MG tablet Commonly known as:  COUMADIN 2 tablets (10 mg) on Monday and Thursday;  1 tablet (5 mg) all other days What changed:  how much to  take  how to take this  when to take this  additional instructions       Discharge Instructions: Please refer to Patient Instructions section of EMR for full details.  Patient was counseled important signs and symptoms that should prompt return to medical care, changes in medications, dietary instructions, activity restrictions, and follow up appointments.   Follow-Up Appointments: Follow-up Information    [facility] Follow up.   Why:  They will do your home health care at your home Contact information: [address] [place] [address] [phone]        Tonye BecketAmy Clegg, NP Follow up on 12/19/2016.   Specialty:  Cardiology Why:  Heart Failure Clinic at 10:20 [address] 5000 Contact information: [address] [place] [address] [phone]        Mickie HillierIan McKeag, MD Follow up on 12/12/2016.   Specialty:  Family Medicine Why:  Appointment at 9:30 AM  Contact information: [address] [place] [address] [phone]           Garth Bigness, MD 12/07/2016, 10:38 PM PGY-1, [facility] Family Medicine

## 2016-11-30 LAB — CBC
HCT: 38.2 % (ref 36.0–46.0)
HEMOGLOBIN: 12.6 g/dL (ref 12.0–15.0)
MCH: 30.5 pg (ref 26.0–34.0)
MCHC: 33 g/dL (ref 30.0–36.0)
MCV: 92.5 fL (ref 78.0–100.0)
Platelets: 93 10*3/uL — ABNORMAL LOW (ref 150–400)
RBC: 4.13 MIL/uL (ref 3.87–5.11)
RDW: 17.8 % — ABNORMAL HIGH (ref 11.5–15.5)
WBC: 4.4 10*3/uL (ref 4.0–10.5)

## 2016-11-30 LAB — BASIC METABOLIC PANEL
Anion gap: 8 (ref 5–15)
BUN: 15 mg/dL (ref 6–20)
CHLORIDE: 103 mmol/L (ref 101–111)
CO2: 27 mmol/L (ref 22–32)
Calcium: 9.1 mg/dL (ref 8.9–10.3)
Creatinine, Ser: 1.08 mg/dL — ABNORMAL HIGH (ref 0.44–1.00)
GFR calc Af Amer: >60 mL/min (ref >60)
GFR calc non Af Amer: 52 mL/min — ABNORMAL LOW (ref >60)
GLUCOSE: 92 mg/dL (ref 65–99)
POTASSIUM: 3.9 mmol/L (ref 3.5–5.1)
Sodium: 138 mmol/L (ref 135–145)

## 2016-11-30 LAB — SAVE SMEAR

## 2016-11-30 LAB — PROTIME-INR
INR: 2.39
Prothrombin Time: 26.5 seconds — ABNORMAL HIGH (ref 11.4–15.2)

## 2016-11-30 MED ORDER — DEXTROSE 5 % IV SOLN
1.0000 g | INTRAVENOUS | Status: DC
  Administered 2016-11-30: 1 g via INTRAVENOUS
  Filled 2016-11-30 (×2): qty 10

## 2016-11-30 NOTE — Progress Notes (Signed)
Subjective:  Patient complains of wheezing and states she thinks it is due to the fact that she has not had her Flonase.  Continues with edema bilaterally.  Starting to diurese.  Weight is down 2 pounds from yesterday.  Has known sleep apnea.  She has 81% RV pacing and has an EF of around 30-35%.  Also on amiodarone because of increased numbers of PVCs.  Quite talkative but I could not get a history that she is that short of breath now.  Objective:  Vital Signs in the last 24 hours: BP 119/69 (BP Location: Right Arm)   Pulse 94   Temp 97.7 F (36.5 C) (Oral)   Resp 19   Ht 5\' 2"  (1.575 m)   Wt 100.9 kg (222 lb 6.4 oz) Comment: Sclae B  SpO2 100%   BMI 40.68 kg/m   Physical Exam: Elderly female in no acute distress Lungs:  Reduced breath sounds  Cardiac:  Irregular rhythm rhythm, normal S1 and S2, no S3, 1 to 2/6 murmur Abdomen:  Soft, nontender, no masses Extremities:  3+ + edema with changes of chronic venous insufficiency Intake/Output from previous day: 12/15 0701 - 12/16 0700 In: 1080 [P.O.:1080] Out: 1750 [Urine:1750] Weight Filed Weights   11/28/16 2228 11/29/16 0707  Weight: 101.8 kg (224 lb 8 oz) 100.9 kg (222 lb 6.4 oz)    Lab Results: Basic Metabolic Panel:  Recent Labs  94/85/46 0527 11/30/16 0343  NA 139 138  K 3.1* 3.9  CL 102 103  CO2 32 27  GLUCOSE 101* 92  BUN 15 15  CREATININE 1.05* 1.08*    CBC:  Recent Labs  11/29/16 1051 11/30/16 0343  WBC 4.6 4.4  NEUTROABS 2.9  --   HGB 13.3 12.6  HCT 40.5 38.2  MCV 92.7 92.5  PLT 108* 93*    BNP    Component Value Date/Time   BNP 909.9 (H) 11/28/2016 1858   BNP 259.7 (H) 07/14/2012 1547    Cardiac Panel (last 3 results)  Recent Labs  11/28/16 2333 11/29/16 0527 11/29/16 1051  TROPONINI <0.03 <0.03 <0.03    PROTIME: Lab Results  Component Value Date   INR 2.39 11/30/2016   INR 2.34 11/29/2016   INR 2.27 11/28/2016    Telemetry: Paced rhythm underlying rhythm is atrial  fibrillation  Assessment/Plan:  1.  Nonischemic cardiomyopathy possibly contribution from increased RV pacing 2.  Atrial fibrillation chronic 3.  History of PVCs suppressed with amiodarone 4.  Poor medical insight into her condition  Recommendations:  She is diuresing reasonably well right now.  I would wonder whether she would benefit from an upgrade to a biventricular pacemaker in light of her predominant RV pacemaking but will defer to the heart failure clinic.     Darden Palmer  MD [facility] Cardiology  11/30/2016, 10:52 AM

## 2016-11-30 NOTE — Progress Notes (Signed)
Family Medicine Teaching Service Daily Progress Note Intern Pager: [phone]  [name] Medical record number: [id number] Date of birth: 27-Jan-1951 Age: 65 y.o. Gender: female  Primary Care Provider: Uvaldo Rising, MD Consultants: None Code Status: Full Code  Pt Overview and Major Events to Date:    Assessment and Plan: Misty Gray is a 65 y.o. female presenting with shortness of breath. PMH is significant for HFrEF (EF 30-35%), A-fib (on Coumadin), HTN, chronic venous insufficiency.  Acute exacerbation of HFrEF, improving:  Lasix  IV BID per HF team. Started spironolactone 12.5 mg daily yesterday.  CXR showed pulmonary congestion.  UOP: 1750 and weight down 2 pounds.  K+ at 3.9 this AM.  - IV lasix  BID - heart failure team consulted; follow up recommendations - strict I/Os and weights - spironolactone 12.5mg  daily and holding losartan - O2 therapy PRN to keep O2 sats >92%; 2L at night (baseline) - AM BMP  Altered mental status, baseline:  AAOx3 this AM. Phenobarb level to 32.8 (appropriate for seizure management) - Neuro checks  Atrial fibrillation, rate controlled:  On Coumadin 5 mg daily and amiodarone  INR 2.36.  Asymptomatic and rate controlled in the 70s. - Coumadin per pharmacy - Amiodarone 200 mg BID-- followed by Cardiology  Hypotension with history of HTN and Hypotension, stable:  98/70 upon admission.  Holding spironolactone and losartan. BP this AM was 103/72.  - continue to monitor - continue to  Losartan - spironolactone 12.5mg    Hypokalemia, resolved: 3.9 upon admission. Magnesium 2.0 - AM BMP  Mild AKI, improving: Baseline Cr appears to be about 1 and was elevated to 1.12 upon admission improved to 1.08 this morning.  - AM BMP - continue to monitor  Chronic Venous Insufficiency: Has been treated many times for cellulitis.  Does not appear to be any active cellulitis.  - continue to monitor - added unna boots  Seizure  disorder, stable: Phenobarb level to 32.8.  - cont phenobarbital 64.7mg  BID  GERD, stable:   - protonix  daily  OSA:  - CPAP and 2L O2 qHS   Pancytopenia, resolved:  Could be explained by phenobarbital use.  - diff add on to this AM CBC - reticulocyte count and blood smear - to 90 morrow AM CBC   FEN/GI: Heart healthy diet with 2 L fluid restriction/PPI Prophylaxis: Warfarin per pharmacy, INR appropriate  Disposition: Admit to telemetry under attending Dr. Leveda Anna  Subjective:  Feels improved this morning. Denies SOB, CP, palpitations. Denies NVD.   Did have some abdominal pain and then had a good BM this AM and now feels improved.   Objective: Temp:  [97.7 F (36.5 C)-98.5 F (36.9 C)] 97.7 F (36.5 C) (12/16 0456) Pulse Rate:  [69-72] 72 (12/16 0456) Resp:  [18] 18 (12/16 0456) BP: (93-108)/(48-65) 93/61 (12/16 0456) SpO2:  [95 %-100 %] 100 % (12/16 0456) FiO2 (%):  [21 %] 21 % (12/15 2320) Physical Exam: General: 65 year old female lying down appearing comfortable ENTM: MMM Cardiovascular: irregularly irregular rhythm,  no MRG Respiratory: NWOB, bibasilar crackles, good air movement, no wheezing or rhonci Gastrointestinal: obese abdomen with well-healed transverse scar, soft, NTND, no peritoneal signs or guarding, non-palpable spleen, +BS Derm: warm and dry Neuro:  AAOx3 Psych: Normal mood and affect  Laboratory:  Recent Labs Lab 11/29/16 0527 11/29/16 1051 11/30/16 0343  WBC 3.3* 4.6 4.4  HGB 11.9* 13.3 12.6  HCT 35.7* 40.5 38.2  PLT 85* 108* 93*    Recent  Labs Lab 11/28/16 1454 11/29/16 0527 11/30/16 0343  NA 137 139 138  K 3.3* 3.1* 3.9  CL 101 102 103  CO2 26 32 27  BUN 17 15 15   CREATININE 1.12* 1.05* 1.08*  CALCIUM 9.4 9.0 9.1  PROT 7.7  --   --   BILITOT 1.6*  --   --   ALKPHOS 105  --   --   ALT 17  --   --   AST 24  --   --   GLUCOSE 109* 101* 92    Imaging/Diagnostic Tests: Dg Chest 2 View  Result Date:  11/28/2016 CLINICAL DATA:  Initial evaluation for acute altered mental status. EXAM: CHEST  2 VIEW COMPARISON:  Prior radiograph from 09/30/2016. FINDINGS: Raynelle Fanning right-sided transvenous pacemaker/AICD in place, stable. Moderate cardiomegaly is unchanged. Mediastinal silhouette within normal limits. Lungs normally inflated. Vascular congestion with diffuse interstitial prominence, suggesting pulmonary interstitial edema. Left pleural effusion. No definite right effusion. Superimposed left basilar opacity favored to reflect atelectasis. Mild atelectatic changes at the right lung base as well. No definite focal infiltrates. No acute osseous abnormality. Accentuation of the normal thoracic kyphosis noted. IMPRESSION: 1. Cardiomegaly with mild pulmonary interstitial edema and small left pleural effusion, compatible with acute CHF exacerbation. 2. Superimposed mild bibasilar atelectasis, left greater than right. Electronically Signed   By: Rise Mu M.D.   On: 11/28/2016 19:34   Ct Head Wo Contrast  Result Date: 11/28/2016 CLINICAL DATA:  Initial evaluation for acute trauma, fall. EXAM: CT HEAD WITHOUT CONTRAST TECHNIQUE: Contiguous axial images were obtained from the base of the skull through the vertex without intravenous contrast. COMPARISON:  Prior CT from 05/25/2015. FINDINGS: Brain: Generalized cerebral atrophy with chronic microvascular ischemic disease. Encephalomalacia within the right frontal, parieto-occipital region, and left occipital lobe compatible with remote ischemic infarcts. The no acute intracranial hemorrhage. No evidence for acute large vessel territory infarct. No mass lesion, midline shift, or mass effect. No hydrocephalus. No extra-axial fluid collection. Vascular: No hyperdense vessel. Skull: Scalp soft tissues demonstrate no acute abnormality. Calvarium intact. Sinuses/Orbits: Globes and orbital soft tissues demonstrate no acute abnormality. Mild mucosal thickening within the  right maxillary sinus and ethmoidal air cells. Paranasal sinuses are otherwise clear. No mastoid effusion. IMPRESSION: 1. No acute intracranial process identified. 2. Atrophy with chronic microvascular ischemic disease and multiple remote infarcts as above, stable from previous. Electronically Signed   By: Rise Mu M.D.   On: 11/28/2016 18:55    Renne Musca, MD 11/30/2016, 8:13 AM PGY-1, [facility] FPTS Intern pager: [phone], text pages welcome

## 2016-11-30 NOTE — Progress Notes (Signed)
ANTICOAGULATION CONSULT NOTE - Follow Up Consult  Pharmacy Consult for Warfarin  Indication: atrial fibrillation  Allergies  Allergen Reactions  . Aspirin Hives and Rash  . Penicillins Rash and Other (See Comments)    Amoxicillin also, Has patient had a PCN reaction causing immediate rash, facial/tongue/throat swelling, SOB or lightheadedness with hypotension: Yes Has patient had a PCN reaction causing severe rash involving mucus membranes or skin necrosis: No Has patient had a PCN reaction that required hospitalization No Has patient had a PCN reaction occurring within the last 10 years: Yes If all of the above answers are "NO", then may proceed with Cephalosporin use.   . Tape Other (See Comments)    NO PAPER TAPE-MUST BE MEDICAL TAPE  . Wool Alcohol [Lanolin] Hives  . Amoxicillin Rash  . Ampicillin Itching and Rash    Patient Measurements: Height: 5\' 2"  (157.5 cm) Weight: 222 lb 6.4 oz (100.9 kg) (Sclae B) IBW/kg (Calculated) : 50.1  Vital Signs: Temp: 97.4 F (36.3 C) (12/16 1200) Temp Source: Oral (12/16 1200) BP: 89/46 (12/16 1200) Pulse Rate: 83 (12/16 1200)  Labs:  Recent Labs  11/28/16 1454 11/28/16 2333 11/29/16 0527 11/29/16 1051 11/30/16 0343  HGB 13.3  --  11.9* 13.3 12.6  HCT 40.2  --  35.7* 40.5 38.2  PLT 98*  --  85* 108* 93*  LABPROT 25.5*  --  26.0*  --  26.5*  INR 2.27  --  2.34  --  2.39  CREATININE 1.12*  --  1.05*  --  1.08*  TROPONINI  --  <0.03 <0.03 <0.03  --     Estimated Creatinine Clearance: 56.2 mL/min (by C-G formula based on SCr of 1.08 mg/dL (H)).   Assessment: 65yo female admitted from [facility] for AMS, CT head stable from previous exam. Also had shortness of breath in setting of heart failure (19 lb weight gain over past 2 weeks).   INR remains stable and therapeutic on home dose at 2.34. No new drug interactions noted while inpatient (phenobarbital and amiodarone both PTA, effects on INR should be realized). Hgb stable.    Goal of Therapy:  INR 2-3 Monitor platelets by anticoagulation protocol: Yes   Plan:  -Warfarin per home regimen: 5 mg daily except 10 mg on Mon/Thurs -Daily PT/INR -Monitor for bleeding  Abran Duke 11/30/2016,1:22 PM

## 2016-11-30 NOTE — Progress Notes (Signed)
Pharmacy Antibiotic Note  Misty Gray is a 65 y.o. female admitted on 11/28/2016 with UTI.  Pharmacy has been consulted for Ceftriaxone dosing. Urine culture with >100,000 E Coli.   Plan: -Ceftriaxone 1g IV q24h -F/U urine culture for directed therapy Height: 5\' 2"  (157.5 cm) Weight: 222 lb 6.4 oz (100.9 kg) (Sclae B) IBW/kg (Calculated) : 50.1  Temp (24hrs), Avg:97.9 F (36.6 C), Min:97.4 F (36.3 C), Max:98.5 F (36.9 C)   Recent Labs Lab 11/26/16 1405 11/28/16 1454 11/28/16 1912 11/29/16 0527 11/29/16 1051 11/30/16 0343  WBC 4.5 4.2  --  3.3* 4.6 4.4  CREATININE 1.04* 1.12*  --  1.05*  --  1.08*  LATICACIDVEN  --   --  1.40  --   --   --     Estimated Creatinine Clearance: 56.2 mL/min (by C-G formula based on SCr of 1.08 mg/dL (H)).    Allergies  Allergen Reactions  . Aspirin Hives and Rash  . Penicillins Rash and Other (See Comments)    Amoxicillin also, Has patient had a PCN reaction causing immediate rash, facial/tongue/throat swelling, SOB or lightheadedness with hypotension: Yes Has patient had a PCN reaction causing severe rash involving mucus membranes or skin necrosis: No Has patient had a PCN reaction that required hospitalization No Has patient had a PCN reaction occurring within the last 10 years: Yes If all of the above answers are "NO", then may proceed with Cephalosporin use.   . Tape Other (See Comments)    NO PAPER TAPE-MUST BE MEDICAL TAPE  . Wool Alcohol [Lanolin] Hives  . Amoxicillin Rash  . Ampicillin Itching and Rash    Abran Duke 11/30/2016 1:57 PM

## 2016-12-01 LAB — BASIC METABOLIC PANEL WITH GFR
Anion gap: 8 (ref 5–15)
BUN: 20 mg/dL (ref 6–20)
CO2: 27 mmol/L (ref 22–32)
Calcium: 9.3 mg/dL (ref 8.9–10.3)
Chloride: 103 mmol/L (ref 101–111)
Creatinine, Ser: 1.08 mg/dL — ABNORMAL HIGH (ref 0.44–1.00)
GFR calc Af Amer: >60 mL/min (ref >60)
GFR calc non Af Amer: 52 mL/min — ABNORMAL LOW (ref >60)
Glucose, Bld: 90 mg/dL (ref 65–99)
Potassium: 3.6 mmol/L (ref 3.5–5.1)
Sodium: 138 mmol/L (ref 135–145)

## 2016-12-01 LAB — PROTIME-INR
INR: 2.26
PROTHROMBIN TIME: 25.4 s — AB (ref 11.4–15.2)

## 2016-12-01 LAB — URINE CULTURE: Culture: >=100000 — AB

## 2016-12-01 MED ORDER — CEPHALEXIN 500 MG PO CAPS
500.0000 mg | ORAL_CAPSULE | Freq: Three times a day (TID) | ORAL | Status: DC
  Administered 2016-12-01 – 2016-12-06 (×15): 500 mg via ORAL
  Filled 2016-12-01 (×16): qty 1

## 2016-12-01 NOTE — Progress Notes (Signed)
Subjective:  Feels as if her breathing is somewhat better.  She is so talkative it is really hard to know what her actual history is.  Still very poor insight into her medical condition.  Appears to be diuresing but still significantly volume overloaded.  No complaints of chest pain.  Objective:  Vital Signs in the last 24 hours: BP (!) 86/60 (BP Location: Left Arm) Comment: RN Ray notified  Pulse 70   Temp 97.5 F (36.4 C) (Oral)   Resp 19   Ht 5\' 2"  (1.575 m)   Wt 100.7 kg (222 lb 1.6 oz) Comment: Scale B  SpO2 99%   BMI 40.62 kg/m   Physical Exam: Elderly female in no acute distress Lungs:  Reduced breath sounds  Cardiac:  Irregular rhythm rhythm, normal S1 and S2, no S3, 1 to 2/6 murmur Abdomen:  Soft, nontender, no masses Extremities:  3+  edema with changes of chronic venous insufficiency  Intake/Output from previous day: 12/16 0701 - 12/17 0700 In: 890 [P.O.:840; IV Piggyback:50] Out: 3202 [Urine:3200; Stool:2] Weight Filed Weights   11/28/16 2228 11/29/16 0707 12/01/16 0530  Weight: 101.8 kg (224 lb 8 oz) 100.9 kg (222 lb 6.4 oz) 100.7 kg (222 lb 1.6 oz)    Lab Results: Basic Metabolic Panel:  Recent Labs  93/81/01 0343 12/01/16 0427  NA 138 138  K 3.9 3.6  CL 103 103  CO2 27 27  GLUCOSE 92 90  BUN 15 20  CREATININE 1.08* 1.08*    CBC:  Recent Labs  11/29/16 1051 11/30/16 0343  WBC 4.6 4.4  NEUTROABS 2.9  --   HGB 13.3 12.6  HCT 40.5 38.2  MCV 92.7 92.5  PLT 108* 93*    BNP    Component Value Date/Time   BNP 909.9 (H) 11/28/2016 1858   BNP 259.7 (H) 07/14/2012 1547    Cardiac Panel (last 3 results)  Recent Labs  11/28/16 2333 11/29/16 0527 11/29/16 1051  TROPONINI <0.03 <0.03 <0.03    PROTIME: Lab Results  Component Value Date   INR 2.26 12/01/2016   INR 2.39 11/30/2016   INR 2.34 11/29/2016    Telemetry: Paced rhythm underlying rhythm is atrial fibrillation  Assessment/Plan:  1.  Nonischemic cardiomyopathy possibly  contribution from increased RV pacing 2.  Atrial fibrillation chronic 3.  History of PVCs suppressed with amiodarone 4.  Poor medical insight into her condition  Recommendations:  Continues to diurese well at this time.  Her blood pressure is borderline but would continue this.  Would like for heart failure team to assess whether she would be a candidate for biventricular pacing since she has a predominence of RV pacing.Darden Palmer  MD [facility]  12/01/2016, 10:19 AM

## 2016-12-01 NOTE — Progress Notes (Signed)
Family Medicine Teaching Service Daily Progress Note Intern Pager: [phone]  [name] Medical record number: [id number] Date of birth: 1951/05/22 Age: 65 y.o. Gender: female  Primary Care Provider: Uvaldo Rising, MD Consultants: None Code Status: Full Code  Assessment and Plan: 65 y.o. female presenting with shortness of breath. PMH is significant for HFrEF (EF 30-35%), A-fib (on Coumadin), HTN, chronic venous insufficiency.  Acute exacerbation of HFrEF, improving:    - Strict I/Os: Output -3.872 liters - Daily weights: Weight 224>>222pounds - Lasix 80mg  IV BID  - Spironolactone (restarted 12/15). Holding Losartan - HF team consulted; follow up recommendations. Considering biventricular pacemaker. Continue IV diuresis.   UTI:  Initial presentation with mild AMS. Urinalysis with rare bacteria, small hemoglobin, trace leukocytes. Urine culture positive for E. Coli, pan sensitive.  - Ceftriaxone given 11/30/16. Transition to Keflex today.   Hypotension with history of HTN and Hypotension, stable:   - Continue to monitor - Spironolactone 12.5mg    Mild AKI: Baseline Cr appears to be about 1 and was elevated to 1.12 upon admission Stable at 1.08.  - AM BMP - Continue to monitor  FEN/GI: Heart healthy diet with 2 L fluid restriction/PPI Prophylaxis: Warfarin per pharmacy, INR appropriate  Disposition: Admit to telemetry under attending Dr. Leveda Anna  Subjective:  Continue to feel better. Notes shortness of breath with exertion, but improved from yesterday. Normal bowel movements.  Objective: Temp:  [97.4 F (36.3 C)-98.2 F (36.8 C)] 97.5 F (36.4 C) (12/17 0530) Pulse Rate:  [69-94] 70 (12/17 0530) Resp:  [18-19] 19 (12/17 0530) BP: (86-119)/(46-93) 86/60 (12/17 0530) SpO2:  [98 %-100 %] 99 % (12/17 0530) Weight:  [222 lb 1.6 oz (100.7 kg)] 222 lb 1.6 oz (100.7 kg) (12/17 0530) Physical Exam: General: 65 year old female in chair with legs elevated,  resting comfortable ENTM: Moist Mucous Membranes Cardiovascular: irregularly irregular rhythm,  Systolic murmur noted Respiratory: NWOB, bibasilar crackles, no wheezing or rhonchi, no increased work of breathing Gastrointestinal: obese abdomen with well-healed transverse scar, soft, NTND Neuro:  AAOx3 Psych: Normal mood and affect  Laboratory:  Recent Labs Lab 11/29/16 0527 11/29/16 1051 11/30/16 0343  WBC 3.3* 4.6 4.4  HGB 11.9* 13.3 12.6  HCT 35.7* 40.5 38.2  PLT 85* 108* 93*    Recent Labs Lab 11/28/16 1454 11/29/16 0527 11/30/16 0343 12/01/16 0427  NA 137 139 138 138  K 3.3* 3.1* 3.9 3.6  CL 101 102 103 103  CO2 26 32 27 27  BUN 17 15 15 20   CREATININE 1.12* 1.05* 1.08* 1.08*  CALCIUM 9.4 9.0 9.1 9.3  PROT 7.7  --   --   --   BILITOT 1.6*  --   --   --   ALKPHOS 105  --   --   --   ALT 17  --   --   --   AST 24  --   --   --   GLUCOSE 109* 101* 92 90  - BNP 909.9 - LA 1.4 - Phenobarbital 32.8  Imaging/Diagnostic Tests: No results found.  [address] [place], [place]  12/01/2016, 8:46 AM PGY-3, [facility] FPTS Intern pager: [phone], text pages welcome

## 2016-12-01 NOTE — Progress Notes (Signed)
ANTICOAGULATION CONSULT NOTE - Follow Up Consult  Pharmacy Consult for Warfarin  Indication: atrial fibrillation  Allergies  Allergen Reactions  . Aspirin Hives and Rash  . Penicillins Rash and Other (See Comments)    Amoxicillin also, Has patient had a PCN reaction causing immediate rash, facial/tongue/throat swelling, SOB or lightheadedness with hypotension: Yes Has patient had a PCN reaction causing severe rash involving mucus membranes or skin necrosis: No Has patient had a PCN reaction that required hospitalization No Has patient had a PCN reaction occurring within the last 10 years: Yes If all of the above answers are "NO", then may proceed with Cephalosporin use.   . Tape Other (See Comments)    NO PAPER TAPE-MUST BE MEDICAL TAPE  . Wool Alcohol [Lanolin] Hives  . Amoxicillin Rash  . Ampicillin Itching and Rash    Patient Measurements: Height: 5\' 2"  (157.5 cm) Weight: 222 lb 1.6 oz (100.7 kg) (Scale B) IBW/kg (Calculated) : 50.1  Vital Signs: Temp: 97.5 F (36.4 C) (12/17 0530) Temp Source: Oral (12/17 0530) BP: 86/60 (12/17 0530) Pulse Rate: 70 (12/17 0530)  Labs:  Recent Labs  11/28/16 2333 11/29/16 0527 11/29/16 1051 11/30/16 0343 12/01/16 0427  HGB  --  11.9* 13.3 12.6  --   HCT  --  35.7* 40.5 38.2  --   PLT  --  85* 108* 93*  --   LABPROT  --  26.0*  --  26.5* 25.4*  INR  --  2.34  --  2.39 2.26  CREATININE  --  1.05*  --  1.08* 1.08*  TROPONINI <0.03 <0.03 <0.03  --   --     Estimated Creatinine Clearance: 56.1 mL/min (by C-G formula based on SCr of 1.08 mg/dL (H)).   Assessment: 65yo female admitted from [facility] for AMS, CT head stable from previous exam. Also had shortness of breath in setting of heart failure (19 lb weight gain over past 2 weeks).   INR remains stable and therapeutic on home dose at 2.2. No new drug interactions noted while inpatient (phenobarbital and amiodarone both PTA, effects on INR should be realized). Hgb stable.    Goal of Therapy:  INR 2-3 Monitor platelets by anticoagulation protocol: Yes   Plan:  -Warfarin per home regimen: 5 mg daily except 10 mg on Mon/Thurs -Change INR checks to MWF -Monitor for bleeding  Sheppard Coil PharmD., BCPS Clinical Pharmacist Pager [phone] 12/01/2016 2:04 PM

## 2016-12-02 LAB — BASIC METABOLIC PANEL
Anion gap: 10 (ref 5–15)
BUN: 21 mg/dL — ABNORMAL HIGH (ref 6–20)
CALCIUM: 9.5 mg/dL (ref 8.9–10.3)
CO2: 26 mmol/L (ref 22–32)
CREATININE: 0.91 mg/dL (ref 0.44–1.00)
Chloride: 101 mmol/L (ref 101–111)
GFR calc non Af Amer: >60 mL/min (ref >60)
GLUCOSE: 98 mg/dL (ref 65–99)
Potassium: 3.9 mmol/L (ref 3.5–5.1)
Sodium: 137 mmol/L (ref 135–145)

## 2016-12-02 LAB — CBC
HEMATOCRIT: 38.6 % (ref 36.0–46.0)
Hemoglobin: 12.7 g/dL (ref 12.0–15.0)
MCH: 31 pg (ref 26.0–34.0)
MCHC: 32.9 g/dL (ref 30.0–36.0)
MCV: 94.1 fL (ref 78.0–100.0)
Platelets: 99 10*3/uL — ABNORMAL LOW (ref 150–400)
RBC: 4.1 MIL/uL (ref 3.87–5.11)
RDW: 17.9 % — AB (ref 11.5–15.5)
WBC: 4.6 10*3/uL (ref 4.0–10.5)

## 2016-12-02 LAB — PROTIME-INR
INR: 1.92
Prothrombin Time: 22.3 seconds — ABNORMAL HIGH (ref 11.4–15.2)

## 2016-12-02 MED ORDER — POTASSIUM CHLORIDE CRYS ER 20 MEQ PO TBCR
20.0000 meq | EXTENDED_RELEASE_TABLET | Freq: Once | ORAL | Status: AC
  Administered 2016-12-02: 20 meq via ORAL
  Filled 2016-12-02: qty 1

## 2016-12-02 MED ORDER — METOLAZONE 5 MG PO TABS
5.0000 mg | ORAL_TABLET | Freq: Once | ORAL | Status: AC
  Administered 2016-12-02: 5 mg via ORAL
  Filled 2016-12-02: qty 1

## 2016-12-02 MED ORDER — WARFARIN SODIUM 10 MG PO TABS
10.0000 mg | ORAL_TABLET | ORAL | Status: DC
  Administered 2016-12-05: 10 mg via ORAL
  Filled 2016-12-02: qty 1

## 2016-12-02 MED ORDER — WARFARIN SODIUM 2.5 MG PO TABS
12.5000 mg | ORAL_TABLET | Freq: Once | ORAL | Status: AC
  Administered 2016-12-02: 12.5 mg via ORAL
  Filled 2016-12-02: qty 1

## 2016-12-02 NOTE — Care Management Important Message (Signed)
Important Message  Patient Details  Name: Misty Gray MRN: [id number] Date of Birth: 1951-03-08   Medicare Important Message Given:  Yes    Nanna Ertle Stefan Church 12/02/2016, 11:32 AM

## 2016-12-02 NOTE — Progress Notes (Signed)
Patient refused CPAP for the night, stated she cant wear our masks.

## 2016-12-02 NOTE — Progress Notes (Signed)
Physical Therapy Treatment Patient Details Name: Misty GrayDolly J Gray MRN: [id number] DOB: 05/26/1951 Today's Date: 12/02/2016    History of Present Illness 65 y.o. female presenting with shortness of breath. PMH is significant for HFrEF (EF 30-35%), A-fib (on Coumadin), HTN, chronic venous insufficiency. AMS.     PT Comments    Pt states that she feels like she is getting better slowly. Planning to return home to her daughter's home and family when she leaves the hospital. During PT session the pt was able to ambulate 160 ft with rw (SpO2 95%), instability noted but no gross loss of balance. PT to continue to follow and progress mobility as tolerated.   Follow Up Recommendations  Home health PT;Supervision for mobility/OOB     Equipment Recommendations  None recommended by PT    Recommendations for Other Services       Precautions / Restrictions Precautions Precautions: Fall Restrictions Weight Bearing Restrictions: No    Mobility  Bed Mobility               General bed mobility comments: pt in chair upon arrival, requests return to chair following session.   Transfers Overall transfer level: Needs assistance Equipment used: Rolling walker (2 wheeled) Transfers: Sit to/from Stand Sit to Stand: Supervision         General transfer comment: Vcs for hand placement and safety  Ambulation/Gait Ambulation/Gait assistance: Min guard Ambulation Distance (Feet): 160 Feet Assistive device: Rolling walker (2 wheeled) Gait Pattern/deviations: Step-through pattern;Trunk flexed Gait velocity: decreased   General Gait Details: cues for posture and staying close to rw. SpO2 95% during ambulation.    Stairs            Wheelchair Mobility    Modified Rankin (Stroke Patients Only)       Balance Overall balance assessment: Needs assistance Sitting-balance support: No upper extremity supported Sitting balance-Leahy Scale: Good     Standing balance support:  Bilateral upper extremity supported Standing balance-Leahy Scale: Poor Standing balance comment: using rw for support                    Cognition Arousal/Alertness: Awake/alert Behavior During Therapy: WFL for tasks assessed/performed Overall Cognitive Status: Within Functional Limits for tasks assessed                      Exercises      General Comments        Pertinent Vitals/Pain Pain Assessment: No/denies pain    Home Living                      Prior Function            PT Goals (current goals can now be found in the care plan section) Acute Rehab PT Goals Patient Stated Goal: to get better PT Goal Formulation: With patient Time For Goal Achievement: 12/13/16 Potential to Achieve Goals: Good Progress towards PT goals: Progressing toward goals    Frequency    Min 3X/week      PT Plan Current plan remains appropriate    Co-evaluation             End of Session Equipment Utilized During Treatment: Gait belt Activity Tolerance: Patient tolerated treatment well Patient left: in bed;with call bell/phone within reach     Time: 1455-1514 PT Time Calculation (min) (ACUTE ONLY): 19 min  Charges:  $Gait Training: 8-22 mins  G Codes:      Christiane Ha, PT, CSCS Pager [phone] Office [phone]  12/02/2016, 3:32 PM

## 2016-12-02 NOTE — Progress Notes (Signed)
Patient refused bed alarm. Will continue to monitor patient. 
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
## 2016-12-02 NOTE — Progress Notes (Signed)
Advanced Heart Failure Rounding Note  Referring Physician: Dr Leveda Anna  Primary Cardiologist:  Dr Gala Romney   Subjective:    Admitted 11/28/16 with volume overloaded. 19 lb weight gain in 2 weeks.   Remains very swollen.  States she is trying to watch her fluid, but was told to not go "under" her limit either.   From admission, out 5 L and down 8 lbs.   Objective:   Weight Range: 216 lb 6.4 oz (98.2 kg) Body mass index is 39.58 kg/m.   Vital Signs:   Temp:  [97.5 F (36.4 C)-98.4 F (36.9 C)] 98.4 F (36.9 C) (12/18 0529) Pulse Rate:  [69-76] 69 (12/18 0529) Resp:  [18-22] 18 (12/18 0529) BP: (101-114)/(61-72) 101/61 (12/18 0529) SpO2:  [94 %-100 %] 98 % (12/18 0529) Weight:  [216 lb 6.4 oz (98.2 kg)] 216 lb 6.4 oz (98.2 kg) (12/18 0529) Last BM Date: 11/29/16  Weight change: Filed Weights   11/29/16 0707 12/01/16 0530 12/02/16 0529  Weight: 222 lb 6.4 oz (100.9 kg) 222 lb 1.6 oz (100.7 kg) 216 lb 6.4 oz (98.2 kg)    Intake/Output:   Intake/Output Summary (Last 24 hours) at 12/02/16 0953 Last data filed at 12/02/16 0911  Gross per 24 hour  Intake              840 ml  Output             2100 ml  Net            -1260 ml     Physical Exam: General:  Elderly and chronically ill appearing.  HEENT: Normal Neck: supple. JVP elevated to jaw. Carotids 2+ bilat; no bruits. No thyromegaly or nodule noted.  Cor: PMI nondisplaced. Irregular rate & rhythm. No M/G/R noted.  Lungs: Diminished basilar sounds.  Abdomen:obese,  soft, NT, moderately distended, no HSM. No bruits or masses. +BS  Extremities: no cyanosis, clubbing, rash, 2-3+ edema to thighs bilaterally.  Neuro: alert & orientedx3, cranial nerves grossly intact. moves all 4 extremities w/o difficulty. Affect pleasant  Telemetry:  Reviewed personally, A fib, V pacing in 70s.   Labs: CBC  Recent Labs  11/29/16 1051 11/30/16 0343  WBC 4.6 4.4  NEUTROABS 2.9  --   HGB 13.3 12.6  HCT 40.5 38.2  MCV 92.7  92.5  PLT 108* 93*   Basic Metabolic Panel  Recent Labs  12/01/16 0427 12/02/16 0551  NA 138 137  K 3.6 3.9  CL 103 101  CO2 27 26  GLUCOSE 90 98  BUN 20 21*  CREATININE 1.08* 0.91  CALCIUM 9.3 9.5   Liver Function Tests No results for input(s): AST, ALT, ALKPHOS, BILITOT, PROT, ALBUMIN in the last 72 hours. No results for input(s): LIPASE, AMYLASE in the last 72 hours. Cardiac Enzymes  Recent Labs  11/29/16 1051  TROPONINI <0.03    BNP: BNP (last 3 results)  Recent Labs  09/15/16 1124 09/30/16 1606 11/28/16 1858  BNP 1,140.6* 927.9* 909.9*    ProBNP (last 3 results) No results for input(s): PROBNP in the last 8760 hours.   D-Dimer No results for input(s): DDIMER in the last 72 hours. Hemoglobin A1C No results for input(s): HGBA1C in the last 72 hours. Fasting Lipid Panel No results for input(s): CHOL, HDL, LDLCALC, TRIG, CHOLHDL, LDLDIRECT in the last 72 hours. Thyroid Function Tests No results for input(s): TSH, T4TOTAL, T3FREE, THYROIDAB in the last 72 hours.  Invalid input(s): FREET3  Other results:  Imaging/Studies:   No results found.  Latest Echo  Latest Cath   Medications:     Scheduled Medications: . amiodarone  200 mg Oral BID  . cephALEXin  500 mg Oral Q8H  . furosemide  80 mg Intravenous BID  . pantoprazole  40 mg Oral Daily  . PHENobarbital  64.8 mg Oral BID  . sodium chloride flush  3 mL Intravenous Q12H  . spironolactone  12.5 mg Oral Daily  . [START ON 12/05/2016] warfarin  10 mg Oral Once per day on Mon Thu  . warfarin  12.5 mg Oral ONCE-1800  . warfarin  5 mg Oral Once per day on Sun Tue Wed Fri Sat  . Warfarin - Pharmacist Dosing Inpatient   Does not apply q1800     Infusions:   PRN Medications:  sodium chloride, acetaminophen, fluticasone, ondansetron (ZOFRAN) IV, sodium chloride flush   Assessment/Plan   1. A/C Biventricular Heart Failure- NICM. LHC norm cors 09/2016. ECHO 09/2016 EF 30-35% RV  moderately dilated.  - Remains ~ 20 lbs above baseline. Markedly overloaded on exam. - Continue IV lasix 80 mg BID. Add metolazone 5 mg daily.  - Metolazone greatly increased diuresis last admission. Will consider up to BID.  - Supp K as needed.  - Continue spiro 12..5 mg daily for now. Pressures have been relatively soft, so will not push with increased diuresis.  2. Frequent PVCs -possible PVC cardiomyopathy. - Stable on amiodarone 200 mg BID.   3. Chronic A fib - Stable. No bleeding on coumadin.  - Continue coumadin. Pharmacy dosing.  4. Hypokalemia - Increase supp with addition of metolazone.  6. Obesity  - Needs to eat healthier, watch portions, and increase activity.  - Physical therapy following.   Add metolazone as above. Remains significantly volume overloaded  Length of Stay: 4  Misty FreerMichael Andrew Gray, Misty Gray  12/02/2016, 9:53 AM  Advanced Heart Failure Team Pager [phone] (M-F; 7a - 4p)  Please contact [facility] for night-coverage after hours (4p -7a ) and weekends on [email or web address]  Patient seen and examined with Otilio SaberAndy Tillery, PA-C. We discussed all aspects of the encounter. I agree with the assessment and plan as stated above.   She remains markedly volume overloaded. Agree with increasing diuretics as above. Watch renal function and electrolytes. Long discussion about need for dietary compliance. Unfortunately she has poor insight into her HF. She is on amio for possible PVC cardiomyopathy. If EF does not improve with suppression of PVCs will need to consider if RV pacing has contribute to reduced EF and whether or not she would benefit from CRT upgrade. This has been discussed with EP previously.   [name], Daniel,MD 12:32 PM

## 2016-12-02 NOTE — Progress Notes (Signed)
ANTICOAGULATION CONSULT NOTE - Follow Up Consult  Pharmacy Consult for Warfarin  Indication: atrial fibrillation  Allergies  Allergen Reactions  . Aspirin Hives and Rash  . Penicillins Rash and Other (See Comments)    Amoxicillin also, Has patient had a PCN reaction causing immediate rash, facial/tongue/throat swelling, SOB or lightheadedness with hypotension: Yes Has patient had a PCN reaction causing severe rash involving mucus membranes or skin necrosis: No Has patient had a PCN reaction that required hospitalization No Has patient had a PCN reaction occurring within the last 10 years: Yes If all of the above answers are "NO", then may proceed with Cephalosporin use.   . Tape Other (See Comments)    NO PAPER TAPE-MUST BE MEDICAL TAPE  . Wool Alcohol [Lanolin] Hives  . Amoxicillin Rash  . Ampicillin Itching and Rash    Patient Measurements: Height: 5\' 2"  (157.5 cm) Weight: 216 lb 6.4 oz (98.2 kg) (scale b) IBW/kg (Calculated) : 50.1  Vital Signs: Temp: 98.4 F (36.9 C) (12/18 0529) Temp Source: Oral (12/18 0529) BP: 101/61 (12/18 0529) Pulse Rate: 69 (12/18 0529)  Labs:  Recent Labs  11/29/16 1051 11/30/16 0343 12/01/16 0427 12/02/16 0551  HGB 13.3 12.6  --   --   HCT 40.5 38.2  --   --   PLT 108* 93*  --   --   LABPROT  --  26.5* 25.4* 22.3*  INR  --  2.39 2.26 1.92  CREATININE  --  1.08* 1.08* 0.91  TROPONINI <0.03  --   --   --     Estimated Creatinine Clearance: 65.6 mL/min (by C-G formula based on SCr of 0.91 mg/dL).   Assessment: 65yo female admitted from [facility] for AMS, CT head stable from previous exam. Also had shortness of breath in setting of heart failure (19 lb weight gain over past 2 weeks).  -INR= 1.92 with trend down  Home coumadin dose: Warfarin dose of 5mg  daily except 10mg  MTh   Goal of Therapy:  INR 2-3 Monitor platelets by anticoagulation protocol: Yes   Plan:  -Coumadin 12.5mg  po today then continue coumadin at the home  dose -Daily PT/INR  Harland German, Pharm D 12/02/2016 8:16 AM

## 2016-12-02 NOTE — Progress Notes (Signed)
Family Medicine Teaching Service Daily Progress Note Intern Pager: [phone]  [name] Medical record number: [id number] Date of birth: 1951/07/15 Age: 65 y.o. Gender: female  Primary Care Provider: Uvaldo Rising, MD Consultants: None Code Status: Full Code  Assessment and Plan: 65 y.o. female presenting with shortness of breath. PMH is significant for HFrEF (EF 30-35%), A-fib (on Coumadin), HTN, chronic venous insufficiency.  Acute exacerbation of HFrEF, improving:    - Added metolazone 5mg  daily  - Strict I/Os: Output -2.3 L 24hrs  - Daily weights: Weight 222>>216pounds - Lasix 80mg  IV BID  - Spironolactone (restarted 12/15). Holding Losartan - HF team consulted; follow up recommendations. Considering biventricular pacemaker. Continue IV diuresis.   UTI:  Urine culture positive for E. Coli, pan sensitive.  - keflex day 2 (total 3 days abx)  Hypotension with history of HTN and Hypotension, stable:   - Continue to monitor - Spironolactone 12.5mg   - holding losartan  Mild AKI: Baseline Cr appears to be about 1 and was elevated to 1.12 upon admission.  Was 0.9 this AM.  - AM BMP - Continue to monitor  FEN/GI: Heart healthy diet with 2 L fluid restriction/PPI Prophylaxis: Warfarin per pharmacy, INR appropriate  Disposition: discharge pending medical improvement Subjective:  Continue to feel better. Denies CP, SOB, NVD or dysuria.   Objective: Temp:  [97.5 F (36.4 C)-98.4 F (36.9 C)] 98.4 F (36.9 C) (12/18 0529) Pulse Rate:  [69-76] 69 (12/18 0529) Resp:  [18-22] 18 (12/18 0529) BP: (101-114)/(61-72) 101/61 (12/18 0529) SpO2:  [94 %-100 %] 98 % (12/18 0529) Weight:  [216 lb 6.4 oz (98.2 kg)] 216 lb 6.4 oz (98.2 kg) (12/18 0529) Physical Exam: General: 65 year old female in chair with legs elevated, resting comfortable ENTM: Moist Mucous Membranes Cardiovascular: irregularly irregular rhythm, regular rate Respiratory: NWOB, faint  bibasilar crackles, no wheezing or rhonchi, no increased work of breathing Gastrointestinal: obese abdomen with well-healed transverse scar, soft, NTND Ext: 2-3+ edema to thighs bilaterally.  Neuro:  AAOx3 Psych: Normal mood and affect  Laboratory:  Recent Labs Lab 11/29/16 0527 11/29/16 1051 11/30/16 0343  WBC 3.3* 4.6 4.4  HGB 11.9* 13.3 12.6  HCT 35.7* 40.5 38.2  PLT 85* 108* 93*    Recent Labs Lab 11/28/16 1454  11/30/16 0343 12/01/16 0427 12/02/16 0551  NA 137  < > 138 138 137  K 3.3*  < > 3.9 3.6 3.9  CL 101  < > 103 103 101  CO2 26  < > 27 27 26   BUN 17  < > 15 20 21*  CREATININE 1.12*  < > 1.08* 1.08* 0.91  CALCIUM 9.4  < > 9.1 9.3 9.5  PROT 7.7  --   --   --   --   BILITOT 1.6*  --   --   --   --   ALKPHOS 105  --   --   --   --   ALT 17  --   --   --   --   AST 24  --   --   --   --   GLUCOSE 109*  < > 92 90 98  < > = values in this interval not displayed.- BNP 909.9 - LA 1.4 - Phenobarbital 32.8  Imaging/Diagnostic Tests: No results found.  Renne Musca, MD  12/02/2016, 8:30 AM PGY-1, [facility] Family Medicine FPTS Intern pager: [phone], text pages welcome

## 2016-12-02 NOTE — Progress Notes (Signed)
Nutrition Education Note  RD consulted for nutrition assessment and diet education.   65 y.o. female presenting with shortness of breath. PMH is significant for HFrEF (EF 30-35%), A-fib (on Coumadin), HTN, chronic venous insufficiency.  Pt states that she has a good appetite and has been "eating like a piggy". She reports that she follows a no added salt diet, but due to recent stress and living with family, she has been eating more fast food. Pt very talkative and tangential. RD attempted to assess pt's knowledge of a low sodium diet versus a no salt added diet. Pt stated that she reads nutrition labels and she then listed several low sodium foods as well as high sodium foods she avoids. She stated that she knows how to follow a low sodium diet, but she has been cheating recently due to stress. When asked what the recommendation was for sodium per day she said she had no idea. RD attempted to educate patient on sodium guidelines per day and per serving as well as the low sodium content of fresh foods. Pt closed her eyes while speaking with RD and interrupted often. Pt states that it is difficult to eat with heart failure because there is nothing to eat. RD discussed with patient that all fresh fruits and vegetables are low in sodium. Pt followed by saying "Well I know that oranges are low in sodium, but I'm not sure about kiwis". RD emphasized that all fresh fruits and vegetables are low in sodium and good for heart health to which patient replied "Are apples low in sodium?" and continued to talk about how she prepares apples at home.    Unable to educate patient at this time. She does not seem that she is interested or that she is ready to learn. Pt mentioned that she has a supportive son who assists her in cooking. RD provided "Low Sodium Nutrition Therapy" handout and "Sodium Content of Foods List" from the Academy of [employer] and Dietetics with RD contact informations. Encouraged patient to share  handouts with her son and have her son contact RD with questions.   Body mass index is 39.58 kg/m. Pt meets criteria for Obesity based on current BMI. She states that her usual dry weight is around 197/198 lbs.   Current diet order is Soft Diet, patient is consuming approximately 100% of meals at this time. Pt states that she needs soft foods due to missing teeth. She has been getting salted crackers on her tray, but knows she shouldn't eat them. RD will change diet to Dysphagia 3 (Mechanical Soft)/Low Sodium diet.  Labs and medications reviewed. No further nutrition interventions warranted at this time. RD contact information provided. If additional nutrition issues arise, please re-consult RD.   Dorothea Ogle RD, CSP, LDN Inpatient Clinical Dietitian Pager: [phone] After Hours Pager: [phone]

## 2016-12-03 ENCOUNTER — Ambulatory Visit: Payer: Self-pay | Admitting: *Deleted

## 2016-12-03 LAB — CBC
HCT: 36.3 % (ref 36.0–46.0)
Hemoglobin: 12.2 g/dL (ref 12.0–15.0)
MCH: 31.2 pg (ref 26.0–34.0)
MCHC: 33.6 g/dL (ref 30.0–36.0)
MCV: 92.8 fL (ref 78.0–100.0)
PLATELETS: 100 10*3/uL — AB (ref 150–400)
RBC: 3.91 MIL/uL (ref 3.87–5.11)
RDW: 17.8 % — AB (ref 11.5–15.5)
WBC: 3.9 10*3/uL — AB (ref 4.0–10.5)

## 2016-12-03 LAB — MAGNESIUM: Magnesium: 2 mg/dL (ref 1.7–2.4)

## 2016-12-03 LAB — BASIC METABOLIC PANEL
Anion gap: 11 (ref 5–15)
BUN: 22 mg/dL — AB (ref 6–20)
CALCIUM: 9.4 mg/dL (ref 8.9–10.3)
CO2: 31 mmol/L (ref 22–32)
CREATININE: 0.98 mg/dL (ref 0.44–1.00)
Chloride: 96 mmol/L — ABNORMAL LOW (ref 101–111)
GFR, EST NON AFRICAN AMERICAN: 58 mL/min — AB (ref >60)
Glucose, Bld: 99 mg/dL (ref 65–99)
Potassium: 3 mmol/L — ABNORMAL LOW (ref 3.5–5.1)
SODIUM: 138 mmol/L (ref 135–145)

## 2016-12-03 MED ORDER — HYDRALAZINE HCL 20 MG/ML IJ SOLN: 10.0000 mg | INTRAMUSCULAR | Status: DC | PRN

## 2016-12-03 MED ORDER — POTASSIUM CHLORIDE CRYS ER 20 MEQ PO TBCR: 40.0000 meq | EXTENDED_RELEASE_TABLET | Freq: Two times a day (BID) | ORAL | Status: DC

## 2016-12-03 MED ORDER — POTASSIUM CHLORIDE CRYS ER 20 MEQ PO TBCR
40.0000 meq | EXTENDED_RELEASE_TABLET | Freq: Four times a day (QID) | ORAL | Status: AC
  Administered 2016-12-03 (×2): 40 meq via ORAL
  Filled 2016-12-03 (×2): qty 2

## 2016-12-03 MED ORDER — METOLAZONE 5 MG PO TABS
5.0000 mg | ORAL_TABLET | Freq: Two times a day (BID) | ORAL | Status: DC
  Administered 2016-12-03 – 2016-12-05 (×5): 5 mg via ORAL
  Filled 2016-12-03 (×5): qty 1

## 2016-12-03 MED ORDER — SPIRONOLACTONE 25 MG PO TABS
25.0000 mg | ORAL_TABLET | Freq: Every day | ORAL | Status: DC
  Administered 2016-12-03 – 2016-12-06 (×4): 25 mg via ORAL
  Filled 2016-12-03 (×3): qty 1

## 2016-12-03 NOTE — Progress Notes (Signed)
Patient refusing CPAP for tonight. 

## 2016-12-03 NOTE — Progress Notes (Signed)
Advanced Heart Failure Rounding Note  Referring Physician: Dr Leveda Anna  Primary Cardiologist:  Dr Gala Romney   Subjective:    Admitted 11/28/16 with volume overloaded. 19 lb weight gain in 2 weeks.   Diuresis improved.  Remains volume overloaded. Not very active. PT following. Denies SOB.   Out 3.6 L and down 4 lbs.  Creatinine stable. K 3.0  Objective:   Weight Range: 212 lb 1.6 oz (96.2 kg) Body mass index is 38.79 kg/m.   Vital Signs:   Temp:  [97.6 F (36.4 C)-97.8 F (36.6 C)] 97.8 F (36.6 C) (12/19 0449) Pulse Rate:  [68-74] 74 (12/19 0449) Resp:  [16-18] 17 (12/19 0449) BP: (90-107)/(54-84) 101/80 (12/19 0732) SpO2:  [96 %-100 %] 96 % (12/19 0449) Weight:  [212 lb 1.6 oz (96.2 kg)] 212 lb 1.6 oz (96.2 kg) (12/19 0449) Last BM Date: 12/01/16  Weight change: Filed Weights   12/01/16 0530 12/02/16 0529 12/03/16 0449  Weight: 222 lb 1.6 oz (100.7 kg) 216 lb 6.4 oz (98.2 kg) 212 lb 1.6 oz (96.2 kg)   Intake/Output:   Intake/Output Summary (Last 24 hours) at 12/03/16 0844 Last data filed at 12/03/16 0600  Gross per 24 hour  Intake              803 ml  Output             4500 ml  Net            -3697 ml    Physical Exam: General:  Elderly. Chronically ill appearing.   HEENT: Normal Neck: supple. JVP remains elevated to jaw. Carotids 2+ bilat; no bruits. No lymphadenopathy or thyromegaly noted.   Cor: PMI nondisplaced. Irregularly irregular. No M/G/R noted.  Lungs: Mildly diminished basilar sounds. Abdomen:obese,  soft, NT, no HSM. No bruits or masses. +BS. Remains moderately distended. Extremities: no cyanosis, clubbing, rash, 3+ edema to thighs bilaterally.  Neuro: alert & orientedx3, cranial nerves grossly intact. moves all 4 extremities w/o difficulty. Affect pleasant  Telemetry:  Reviewed, Afib, V pacing in 70s. + PVCs    Labs: CBC  Recent Labs  12/02/16 1009 12/03/16 0400  WBC 4.6 3.9*  HGB 12.7 12.2  HCT 38.6 36.3  MCV 94.1 92.8  PLT 99*  100*   Basic Metabolic Panel  Recent Labs  12/02/16 0551 12/03/16 0400  NA 137 138  K 3.9 3.0*  CL 101 96*  CO2 26 31  GLUCOSE 98 99  BUN 21* 22*  CREATININE 0.91 0.98  CALCIUM 9.5 9.4  MG  --  2.0   Liver Function Tests No results for input(s): AST, ALT, ALKPHOS, BILITOT, PROT, ALBUMIN in the last 72 hours. No results for input(s): LIPASE, AMYLASE in the last 72 hours. Cardiac Enzymes No results for input(s): CKTOTAL, CKMB, CKMBINDEX, TROPONINI in the last 72 hours.  BNP: BNP (last 3 results)  Recent Labs  09/15/16 1124 09/30/16 1606 11/28/16 1858  BNP 1,140.6* 927.9* 909.9*    ProBNP (last 3 results) No results for input(s): PROBNP in the last 8760 hours.   D-Dimer No results for input(s): DDIMER in the last 72 hours. Hemoglobin A1C No results for input(s): HGBA1C in the last 72 hours. Fasting Lipid Panel No results for input(s): CHOL, HDL, LDLCALC, TRIG, CHOLHDL, LDLDIRECT in the last 72 hours. Thyroid Function Tests No results for input(s): TSH, T4TOTAL, T3FREE, THYROIDAB in the last 72 hours.  Invalid input(s): FREET3  Other results:  Imaging/Studies:  No results found.  Medications:  Scheduled Medications: . amiodarone  200 mg Oral BID  . cephALEXin  500 mg Oral Q8H  . furosemide  80 mg Intravenous BID  . pantoprazole  40 mg Oral Daily  . PHENobarbital  64.8 mg Oral BID  . potassium chloride  40 mEq Oral Q6H  . sodium chloride flush  3 mL Intravenous Q12H  . spironolactone  12.5 mg Oral Daily  . [START ON 12/05/2016] warfarin  10 mg Oral Once per day on Mon Thu  . warfarin  5 mg Oral Once per day on Sun Tue Wed Fri Sat  . Warfarin - Pharmacist Dosing Inpatient   Does not apply q1800    PRN Medications: sodium chloride, acetaminophen, fluticasone, ondansetron (ZOFRAN) IV, sodium chloride flush  Assessment/Plan   1. A/C Biventricular Heart Failure- NICM. LHC norm cors 09/2016. ECHO 09/2016 EF 30-35% RV moderately dilated.  - She  remains markedly volume overloaded exam.  - Continue IV lasix 80 mg BID. Increase to metolazone 5 mg BID.   - Increase spironolactone 25 mg daily.  2. Frequent PVCs -possible PVC cardiomyopathy. - Stable on amiodarone 200 mg BID.  Still having occasional PVCs. 3. Chronic A fib - Stable. No bleeding on coumadin.  - Continue coumadin. Pharmacy dosing.  4. Hypokalemia - Increase supp with addition of metolazone.  5. Obesity  - Needs to eat healthier, watch portions, and increase activity.  - Physical therapy following.   Remains markedly volume overloaded. Increasing diuresis as above.   Length of Stay: 5  Luane SchoolMichael Andrew Tillery, PA-C  12/03/2016, 8:44 AM  Advanced Heart Failure Team Pager [phone] (M-F; 7a - 4p)  Please contact [facility] for night-coverage after hours (4p -7a ) and weekends on [email or web address]  Patient seen and examined with Otilio SaberAndy Tillery, PA-C. We discussed all aspects of the encounter. I agree with the assessment and plan as stated above.   She remains markedly volume overloaded. Agree with increasing metolazone as above. She will likely have at least 4-5 more days of IV diuresis. She is on amio for possible PVC cardiomyopathy. If EF does not improve with suppression of PVCs will need to consider if RV pacing has contribute to reduced EF and whether or not she would benefit from CRT upgrade. This has been discussed with EP previously.   [name], Daniel,MD 10:33 AM

## 2016-12-03 NOTE — Progress Notes (Signed)
Family Medicine Teaching Service Daily Progress Note Intern Pager: [phone]  [name] Medical record number: [id number] Date of birth: 18-Feb-1951 Age: 65 y.o. Gender: female  Primary Care Provider: Uvaldo Rising, MD Consultants: None Code Status: Full Code  Assessment and Plan: 65 y.o. female presenting with shortness of breath. PMH is significant for HFrEF (EF 30-35%), A-fib (on Coumadin), HTN, chronic venous insufficiency.  Acute exacerbation of HFrEF, improving:    - cont metolazone 5mg  daily  - Strict I/Os: Output -4.5 L 24hrs  - Daily weights: Weight 222>>212pounds - Lasix 80mg  IV BID  - Spironolactone (restarted 12/15). Holding Losartan - HF team consulted; follow up recommendations. Considering biventricular pacemaker. Continue IV diuresis.   UTI:  Urine culture positive for E. Coli, pan sensitive.  - keflex day 3 (total 4 days abx)  Hypotension with history of HTN and Hypotension, stable:   - Continue to monitor - Spironolactone 12.5mg   - holding losartan  Mild AKI: Baseline Cr appears to be about 1 and was elevated to 1.12 upon admission.  Was 0.98 this AM.  - AM BMP - Continue to monitor  Hypokalemia: 3.0 this AM. - repleted with kdur - AM BMP  FEN/GI: Heart healthy diet with 2 L fluid restriction/PPI Prophylaxis: Warfarin per pharmacy, INR appropriate  Disposition: discharge pending medical improvement Subjective:  Continue to feel better. Denies CP, SOB, NVD or dysuria.   Objective: Temp:  [97.6 F (36.4 C)-97.8 F (36.6 C)] 97.8 F (36.6 C) (12/19 0449) Pulse Rate:  [68-74] 74 (12/19 0449) Resp:  [16-18] 17 (12/19 0449) BP: (90-107)/(54-84) 101/80 (12/19 0732) SpO2:  [96 %-100 %] 96 % (12/19 0449) Weight:  [212 lb 1.6 oz (96.2 kg)] 212 lb 1.6 oz (96.2 kg) (12/19 0449) Physical Exam: General: 65 year old female in chair with legs elevated, resting comfortable ENTM: Moist Mucous Membranes Cardiovascular: irregularly  irregular rhythm, regular rate Respiratory: NWOB, faint bibasilar crackles, no wheezing or rhonchi, no increased work of breathing Gastrointestinal: obese abdomen with well-healed transverse scar, soft, NTND Ext: 2-3+ edema to thighs bilaterally.  Neuro:  AAOx3 Psych: Normal mood and affect  Laboratory:  Recent Labs Lab 11/30/16 0343 12/02/16 1009 12/03/16 0400  WBC 4.4 4.6 3.9*  HGB 12.6 12.7 12.2  HCT 38.2 38.6 36.3  PLT 93* 99* 100*    Recent Labs Lab 11/28/16 1454  12/01/16 0427 12/02/16 0551 12/03/16 0400  NA 137  < > 138 137 138  K 3.3*  < > 3.6 3.9 3.0*  CL 101  < > 103 101 96*  CO2 26  < > 27 26 31   BUN 17  < > 20 21* 22*  CREATININE 1.12*  < > 1.08* 0.91 0.98  CALCIUM 9.4  < > 9.3 9.5 9.4  PROT 7.7  --   --   --   --   BILITOT 1.6*  --   --   --   --   ALKPHOS 105  --   --   --   --   ALT 17  --   --   --   --   AST 24  --   --   --   --   GLUCOSE 109*  < > 90 98 99  < > = values in this interval not displayed.- BNP 909.9 - LA 1.4 - Phenobarbital 32.8  Imaging/Diagnostic Tests: No results found.  Renne Musca, MD  12/03/2016, 8:58 AM PGY-1, [facility] Family Medicine FPTS Intern pager: [phone], text  pages welcome

## 2016-12-03 NOTE — Progress Notes (Signed)
Occupational Therapy Treatment Patient Details Name: Misty Gray MRN: [id number] DOB: [birth year] Today's Date: 12/03/2016    History of present illness 65 y.o. female presenting with shortness of breath. PMH is significant for HFrEF (EF 30-35%), A-fib (on Coumadin), HTN, chronic venous insufficiency. AMS.    OT comments  This 65 yo female presents to acute OT today making progress with toileting transfers, clothing manipulation and hygiene as well as standing to groom at sink. Her O2 sats on RA remained in high 90's throughout session.   Follow Up Recommendations  Home health OT;Supervision - Intermittent    Equipment Recommendations  None recommended by OT       Precautions / Restrictions Precautions Precautions: Fall Restrictions Weight Bearing Restrictions: No       Mobility Bed Mobility               General bed mobility comments: pt in chair upon arrival  Transfers Overall transfer level: Needs assistance Equipment used: Rolling walker (2 wheeled) Transfers: Sit to/from Stand Sit to Stand: Supervision                  ADL Overall ADL's : Needs assistance/impaired     Grooming: Wash/dry hands;Standing;Supervision/safety               Lower Body Dressing: Moderate assistance (S sit<>stand (can get socks off but not back on --reports family can A her prn until she can do them by herself again))   Toilet Transfer: Supervision/safety;Ambulation;RW;BSC (on other side of bed)   Toileting- Clothing Manipulation and Hygiene: Supervision/safety;Sit to/from stand                          Cognition   Behavior During Therapy: [facility] for tasks assessed/performed Overall Cognitive Status: Within Functional Limits for tasks assessed                                    Pertinent Vitals/ Pain       Pain Assessment: No/denies pain         Frequency  Min 2X/week        Progress Toward Goals  OT Goals(current goals can now  be found in the care plan section)  Progress towards OT goals: Progressing toward goals     Plan Discharge plan remains appropriate       End of Session Equipment Utilized During Treatment: Rolling walker   Activity Tolerance Patient tolerated treatment well   Patient Left in chair;with call bell/phone within reach   Nurse Communication          Time: 1110-1134 OT Time Calculation (min): 24 min  Charges: OT General Charges $OT Visit: 1 Procedure OT Treatments $Self Care/Home Management : 8-22 mins  Evette Georges [phone] 12/03/2016, 1:59 PM

## 2016-12-04 ENCOUNTER — Other Ambulatory Visit: Payer: Self-pay | Admitting: Family Medicine

## 2016-12-04 LAB — CBC
HCT: 39.8 % (ref 36.0–46.0)
Hemoglobin: 13.3 g/dL (ref 12.0–15.0)
MCH: 31.1 pg (ref 26.0–34.0)
MCHC: 33.4 g/dL (ref 30.0–36.0)
MCV: 93.2 fL (ref 78.0–100.0)
PLATELETS: 103 10*3/uL — AB (ref 150–400)
RBC: 4.27 MIL/uL (ref 3.87–5.11)
RDW: 17.5 % — AB (ref 11.5–15.5)
WBC: 4.1 10*3/uL (ref 4.0–10.5)

## 2016-12-04 LAB — BASIC METABOLIC PANEL
ANION GAP: 11 (ref 5–15)
BUN: 26 mg/dL — ABNORMAL HIGH (ref 6–20)
CALCIUM: 9.8 mg/dL (ref 8.9–10.3)
CO2: 31 mmol/L (ref 22–32)
CREATININE: 1.15 mg/dL — AB (ref 0.44–1.00)
Chloride: 93 mmol/L — ABNORMAL LOW (ref 101–111)
GFR, EST AFRICAN AMERICAN: 56 mL/min — AB (ref >60)
GFR, EST NON AFRICAN AMERICAN: 48 mL/min — AB (ref >60)
Glucose, Bld: 88 mg/dL (ref 65–99)
Potassium: 3.7 mmol/L (ref 3.5–5.1)
Sodium: 135 mmol/L (ref 135–145)

## 2016-12-04 LAB — PATHOLOGIST SMEAR REVIEW

## 2016-12-04 LAB — PROTIME-INR
INR: 2.03
Prothrombin Time: 23.2 seconds — ABNORMAL HIGH (ref 11.4–15.2)

## 2016-12-04 MED ORDER — POTASSIUM CHLORIDE CRYS ER 20 MEQ PO TBCR
40.0000 meq | EXTENDED_RELEASE_TABLET | Freq: Two times a day (BID) | ORAL | Status: DC
  Administered 2016-12-04 – 2016-12-06 (×5): 40 meq via ORAL
  Filled 2016-12-04 (×5): qty 2

## 2016-12-04 NOTE — Progress Notes (Signed)
Family Medicine Teaching Service Daily Progress Note Intern Pager: [phone]  [name] Medical record number: [id number] Date of birth: 10-11-1951 Age: 65 y.o. Gender: female  Primary Care Provider: Uvaldo Rising, MD Consultants: None Code Status: Full Code  Assessment and Plan: 65 y.o. female presenting with shortness of breath. PMH is significant for HFrEF (EF 30-35%), A-fib (on Coumadin), HTN, chronic venous insufficiency.  Acute exacerbation of HFrEF, improving:    - increase metolazone to 5mg  BID - Strict I/Os: Output -6.7 L 24hrs  - Daily weights: Weight 212>>203pounds - Lasix 80mg  IV BID  -  Increases spironolactone 25mg . Holding Losartan - HF team consulted; follow up recommendations. Considering biventricular pacemaker. - Continue IV diuresis.  - OT recommended no follow up  UTI:  Urine culture positive for E. Coli, pan sensitive.  - keflex day 4 (total 5 days abx)  Hypotension with history of HTN and Hypotension, stable:   - Continue to monitor - Spironolactone 25mg  daily - holding losartan  Mild AKI: Baseline Cr appears to be about 1 and was elevated to 1.12 upon admission.  Was 1.15 this AM. Very likely due to lasix.  - AM BMP  - Continue to monitor  Hypokalemia: 3.7 this AM. - AM BMP  FEN/GI: Heart healthy diet with 2 L fluid restriction/PPI Prophylaxis: Warfarin per pharmacy, INR appropriate  Disposition: discharge pending medical improvement Subjective:  Continue to feel better. Denies CP, SOB, NVD or dysuria.   Objective: Temp:  [97.8 F (36.6 C)-98.6 F (37 C)] 98.4 F (36.9 C) (12/20 0522) Pulse Rate:  [69-72] 71 (12/20 0522) Resp:  [18] 18 (12/20 0522) BP: (94-107)/(57-80) 94/57 (12/20 0522) SpO2:  [95 %-99 %] 95 % (12/20 0522) Weight:  [203 lb (92.1 kg)] 203 lb (92.1 kg) (12/20 0522) Physical Exam: General: 65 year old female in chair with legs elevated, resting comfortable ENTM: Moist Mucous  Membranes Cardiovascular: irregularly irregular rhythm, regular rate Respiratory: NWOB, faint bibasilar crackles, no wheezing or rhonchi, no increased work of breathing Gastrointestinal: obese abdomen with well-healed transverse scar, soft, NTND Ext: 2+ edema to knees bilaterally Neuro:  AAOx3 Psych: Normal mood and affect  Laboratory:  Recent Labs Lab 12/02/16 1009 12/03/16 0400 12/04/16 0452  WBC 4.6 3.9* 4.1  HGB 12.7 12.2 13.3  HCT 38.6 36.3 39.8  PLT 99* 100* 103*    Recent Labs Lab 11/28/16 1454  12/02/16 0551 12/03/16 0400 12/04/16 0452  NA 137  < > 137 138 135  K 3.3*  < > 3.9 3.0* 3.7  CL 101  < > 101 96* 93*  CO2 26  < > 26 31 31   BUN 17  < > 21* 22* 26*  CREATININE 1.12*  < > 0.91 0.98 1.15*  CALCIUM 9.4  < > 9.5 9.4 9.8  PROT 7.7  --   --   --   --   BILITOT 1.6*  --   --   --   --   ALKPHOS 105  --   --   --   --   ALT 17  --   --   --   --   AST 24  --   --   --   --   GLUCOSE 109*  < > 98 99 88  < > = values in this interval not displayed.- BNP 909.9 - LA 1.4 - Phenobarbital 32.8  Imaging/Diagnostic Tests: No results found.  Renne Musca, MD  12/04/2016, 7:26 AM PGY-1, [facility] Family Medicine  [place] Intern pager: [phone], text pages welcome

## 2016-12-04 NOTE — Progress Notes (Deleted)
Notified oncall intern pager [phone] that patient had 7 runs of VTach according to central telemetry monitoring 

## 2016-12-04 NOTE — Progress Notes (Signed)
ANTICOAGULATION CONSULT NOTE - Follow Up Consult  Pharmacy Consult for Warfarin  Indication: atrial fibrillation  Patient Measurements: Height: 5\' 2"  (157.5 cm) Weight: 203 lb (92.1 kg) (scale b) IBW/kg (Calculated) : 50.1  Vital Signs: Temp: 97.8 F (36.6 C) (12/20 1154) Temp Source: Oral (12/20 1154) BP: 102/64 (12/20 1154) Pulse Rate: 70 (12/20 1154)  Labs:  Recent Labs  12/02/16 0551  12/02/16 1009 12/03/16 0400 12/04/16 0452  HGB  --   < > 12.7 12.2 13.3  HCT  --   --  38.6 36.3 39.8  PLT  --   --  99* 100* 103*  LABPROT 22.3*  --   --   --  23.2*  INR 1.92  --   --   --  2.03  CREATININE 0.91  --   --  0.98 1.15*  < > = values in this interval not displayed.  Estimated Creatinine Clearance: 50.1 mL/min (by C-G formula based on SCr of 1.15 mg/dL (H)).   Assessment: 65yo female admitted from [facility] for AMS, CT head stable from previous exam. Also had shortness of breath in setting of heart failure (19 lb weight gain over past 2 weeks).   INR remains stable and therapeutic on home dose. No new drug interactions noted while inpatient (phenobarbital and amiodarone both PTA, effects on INR should be realized). Hgb stable.   Goal of Therapy:  INR 2-3 Monitor platelets by anticoagulation protocol: Yes   Plan:  -Warfarin per home regimen: 5 mg daily except 10 mg on Mon/Thurs -Change INR checks to MWF -Monitor for bleeding  Sheppard Coil PharmD., BCPS Clinical Pharmacist Pager [phone] 12/04/2016 1:52 PM

## 2016-12-04 NOTE — Progress Notes (Signed)
Physical Therapy Treatment Patient Details Name: Misty GrayDolly J Marconi MRN: [id number] DOB: [birth year] Today's Date: 12/04/2016    History of Present Illness 65 y.o. female presenting with shortness of breath. PMH is significant for HFrEF (EF 30-35%), A-fib (on Coumadin), HTN, chronic venous insufficiency. AMS.     PT Comments    Pt presented sitting OOB in recliner when PT entered room. Pt making good progress with mobility but continuing to require frequent VC'ing for safety with RW during ambulation. Pt would continue to benefit from skilled physical therapy services at this time while admitted and after d/c to address her limitations in order to improve her overall safety and independence with functional mobility.   Follow Up Recommendations  Home health PT;Supervision for mobility/OOB     Equipment Recommendations  None recommended by PT    Recommendations for Other Services       Precautions / Restrictions Precautions Precautions: Fall Restrictions Weight Bearing Restrictions: No    Mobility  Bed Mobility               General bed mobility comments: pt in chair upon arrival  Transfers Overall transfer level: Needs assistance Equipment used: Rolling walker (2 wheeled) Transfers: Sit to/from Stand Sit to Stand: Supervision         General transfer comment: pt required increased time, supervision for safety  Ambulation/Gait Ambulation/Gait assistance: Min guard Ambulation Distance (Feet): 200 Feet Assistive device: Rolling walker (2 wheeled) Gait Pattern/deviations: Step-through pattern;Trunk flexed Gait velocity: decreased Gait velocity interpretation: Below normal speed for age/gender General Gait Details: pt required frequent VC'ing to maintain safe distance from RW and for forward visual gaze.   Stairs            Wheelchair Mobility    Modified Rankin (Stroke Patients Only)       Balance Overall balance assessment: Needs  assistance Sitting-balance support: Feet supported;No upper extremity supported Sitting balance-Leahy Scale: Good     Standing balance support: During functional activity;Bilateral upper extremity supported Standing balance-Leahy Scale: Poor Standing balance comment: pt reliant on bilateral UEs on RW                    Cognition Arousal/Alertness: Awake/alert Behavior During Therapy: WFL for tasks assessed/performed Overall Cognitive Status: Within Functional Limits for tasks assessed                      Exercises      General Comments        Pertinent Vitals/Pain Pain Assessment: No/denies pain    Home Living                      Prior Function            PT Goals (current goals can now be found in the care plan section) Acute Rehab PT Goals Patient Stated Goal: to get better PT Goal Formulation: With patient Time For Goal Achievement: 12/13/16 Potential to Achieve Goals: Good Progress towards PT goals: Progressing toward goals    Frequency    Min 3X/week      PT Plan Current plan remains appropriate    Co-evaluation             End of Session Equipment Utilized During Treatment: Gait belt Activity Tolerance: Patient tolerated treatment well Patient left: in chair;with call bell/phone within reach     Time: 1030-1051 PT Time Calculation (min) (ACUTE ONLY): 21 min  Charges:  $Gait Training:  8-22 mins                    G Codes:      Alessandra Bevels Briggitte Boline 2016-12-10, 2:19 PM Deborah Chalk, [place], DPT [phone]

## 2016-12-04 NOTE — Progress Notes (Signed)
Advanced Heart Failure Rounding Note  Referring Physician: Dr Leveda AnnaHensel  Primary Cardiologist:  Dr Gala RomneyBensimhon   Subjective:    Admitted 11/28/16 with volume overloaded. 19 lb weight gain in 2 weeks.   Brisk diuresis noted.  She is feeling somewhat better, but remains markedly overload.  No SOB at rest. Remains mostly sedentary.    Out 6.2 L and down 9 lbs. Creatinine stable. K 3.7  Objective:   Weight Range: 203 lb (92.1 kg) Body mass index is 37.13 kg/m.   Vital Signs:   Temp:  [97.8 F (36.6 C)-98.6 F (37 C)] 98.4 F (36.9 C) (12/20 0522) Pulse Rate:  [69-72] 71 (12/20 0522) Resp:  [18] 18 (12/20 0522) BP: (94-107)/(57-60) 94/57 (12/20 0522) SpO2:  [95 %-99 %] 95 % (12/20 0522) Weight:  [203 lb (92.1 kg)] 203 lb (92.1 kg) (12/20 0522) Last BM Date: 12/01/16  Weight change: Filed Weights   12/02/16 0529 12/03/16 0449 12/04/16 0522  Weight: 216 lb 6.4 oz (98.2 kg) 212 lb 1.6 oz (96.2 kg) 203 lb (92.1 kg)   Intake/Output:   Intake/Output Summary (Last 24 hours) at 12/04/16 0942 Last data filed at 12/04/16 [id number]  Gross per 24 hour  Intake              462 ml  Output             6750 ml  Net            -6288 ml    Physical Exam: General:  Elderly. NAD HEENT: Normal Neck: supple. JVP elevated to jaw. Carotids 2+ bilat; no bruits. No thyromegaly or nodule noted.  Cor: PMI nondisplaced. Irregularly irregular. No M/G/R noted.  Lungs: Diminished basilar sounds Abdomen:obese, soft, NT, ND, no HSM. No bruits or masses. +BS  Extremities: no cyanosis, clubbing, rash, 2+ edema to thighs.  Neuro: alert & orientedx3, cranial nerves grossly intact. moves all 4 extremities w/o difficulty. Affect pleasant  Telemetry:  Reviewed, Afib with V Pacing. + PVCs  Labs: CBC  Recent Labs  12/03/16 0400 12/04/16 0452  WBC 3.9* 4.1  HGB 12.2 13.3  HCT 36.3 39.8  MCV 92.8 93.2  PLT 100* 103*   Basic Metabolic Panel  Recent Labs  12/03/16 0400 12/04/16 0452  NA 138 135   K 3.0* 3.7  CL 96* 93*  CO2 31 31  GLUCOSE 99 88  BUN 22* 26*  CREATININE 0.98 1.15*  CALCIUM 9.4 9.8  MG 2.0  --    Liver Function Tests No results for input(s): AST, ALT, ALKPHOS, BILITOT, PROT, ALBUMIN in the last 72 hours. No results for input(s): LIPASE, AMYLASE in the last 72 hours. Cardiac Enzymes No results for input(s): CKTOTAL, CKMB, CKMBINDEX, TROPONINI in the last 72 hours.  BNP: BNP (last 3 results)  Recent Labs  09/15/16 1124 09/30/16 1606 11/28/16 1858  BNP 1,140.6* 927.9* 909.9*    ProBNP (last 3 results) No results for input(s): PROBNP in the last 8760 hours.   D-Dimer No results for input(s): DDIMER in the last 72 hours. Hemoglobin A1C No results for input(s): HGBA1C in the last 72 hours. Fasting Lipid Panel No results for input(s): CHOL, HDL, LDLCALC, TRIG, CHOLHDL, LDLDIRECT in the last 72 hours. Thyroid Function Tests No results for input(s): TSH, T4TOTAL, T3FREE, THYROIDAB in the last 72 hours.  Invalid input(s): FREET3  Other results:  Imaging/Studies:  No results found.  Medications:    Scheduled Medications: . amiodarone  200 mg Oral BID  .  cephALEXin  500 mg Oral Q8H  . furosemide  80 mg Intravenous BID  . metolazone  5 mg Oral BID  . pantoprazole  40 mg Oral Daily  . PHENobarbital  64.8 mg Oral BID  . sodium chloride flush  3 mL Intravenous Q12H  . spironolactone  25 mg Oral Daily  . [START ON 12/05/2016] warfarin  10 mg Oral Once per day on Mon Thu  . warfarin  5 mg Oral Once per day on Sun Tue Wed Fri Sat  . Warfarin - Pharmacist Dosing Inpatient   Does not apply q1800    PRN Medications: sodium chloride, acetaminophen, fluticasone, ondansetron (ZOFRAN) IV, sodium chloride flush  Assessment/Plan   1. A/C Biventricular Heart Failure- NICM. LHC norm cors 09/2016. ECHO 09/2016 EF 30-35% RV moderately dilated.  - Diuresis is improved, but she remains markedly volume overloaded on exam.   - Continue IV lasix 80 mg BID.  Continue metolazone 5 mg BID.    - Continue spironolactone 25 mg daily.  2. Frequent PVCs -possible PVC cardiomyopathy. - Stable on amiodarone 200 mg BID.  Still having occasional PVCs. 3. Chronic A fib - Stable. No bleeding on coumadin.  - Continue coumadin. Pharmacy dosing.  4. Hypokalemia - Supp with 40 meq BID.   - Continue to follow closely.   5. Obesity  - Needs to eat healthier, watch portions, and increase activity.  - Physical therapy following.   Brisk diuresis noted overnight. Continue current regimen as above.    Length of Stay: [address]  Luane School  12/04/2016, 9:42 AM  Advanced Heart Failure Team Pager [phone] (M-F; 7a - 4p)  Please contact [facility] for night-coverage after hours (4p -7a ) and weekends on [email or web address]  Patient seen and examined with Otilio Saber, PA-C. We discussed all aspects of the encounter. I agree with the assessment and plan as stated above.   She diuresed 9 pounds overnight. Still overloaded. Creatinine stable continue diuresis. Supp K+.   She will likely have at least 2-3 more days of IV diuresis. She is on amio for possible PVC cardiomyopathy. If EF does not improve with suppression of PVCs will need to consider if RV pacing has contribute to reduced EF and whether or not she would benefit from CRT upgrade. This has been discussed with EP previously.   [name], Daniel,MD 9:48 AM

## 2016-12-04 NOTE — Progress Notes (Signed)
Patient refusing CPAP for tonight. 

## 2016-12-05 DIAGNOSIS — I493 Ventricular premature depolarization: Secondary | ICD-10-CM

## 2016-12-05 LAB — BASIC METABOLIC PANEL
Anion gap: 10 (ref 5–15)
BUN: 30 mg/dL — ABNORMAL HIGH (ref 6–20)
CALCIUM: 9.8 mg/dL (ref 8.9–10.3)
CO2: 35 mmol/L — ABNORMAL HIGH (ref 22–32)
CREATININE: 1.24 mg/dL — AB (ref 0.44–1.00)
Chloride: 90 mmol/L — ABNORMAL LOW (ref 101–111)
GFR calc non Af Amer: 44 mL/min — ABNORMAL LOW (ref >60)
GFR, EST AFRICAN AMERICAN: 51 mL/min — AB (ref >60)
Glucose, Bld: 95 mg/dL (ref 65–99)
Potassium: 3.4 mmol/L — ABNORMAL LOW (ref 3.5–5.1)
SODIUM: 135 mmol/L (ref 135–145)

## 2016-12-05 LAB — CBC
HEMATOCRIT: 36.9 % (ref 36.0–46.0)
HEMOGLOBIN: 12.1 g/dL (ref 12.0–15.0)
MCH: 30.6 pg (ref 26.0–34.0)
MCHC: 32.8 g/dL (ref 30.0–36.0)
MCV: 93.4 fL (ref 78.0–100.0)
Platelets: 102 10*3/uL — ABNORMAL LOW (ref 150–400)
RBC: 3.95 MIL/uL (ref 3.87–5.11)
RDW: 17.4 % — AB (ref 11.5–15.5)
WBC: 4.2 10*3/uL (ref 4.0–10.5)

## 2016-12-05 MED ORDER — SODIUM CHLORIDE 0.9 % IV BOLUS (SEPSIS)
500.0000 mL | Freq: Once | INTRAVENOUS | Status: AC
  Administered 2016-12-05: 500 mL via INTRAVENOUS

## 2016-12-05 NOTE — Progress Notes (Signed)
Advanced Heart Failure Rounding Note  Referring Physician: Dr Leveda AnnaHensel  Primary Cardiologist:  Dr Gala RomneyBensimhon   Subjective:    Admitted 11/28/16 with volume overloaded. 19 lb weight gain in 2 weeks.   Feeling better but remains swollen.  Continues to have a lot of urine output.   Out 3.4 L and down another 5 lbs. Creatinine trending up slightly. BP soft.   Objective:   Weight Range: 197 lb 12.8 oz (89.7 kg) Body mass index is 36.18 kg/m.   Vital Signs:   Temp:  [97.8 F (36.6 C)-98.6 F (37 C)] 98.3 F (36.8 C) (12/21 0417) Pulse Rate:  [63-70] 69 (12/21 0417) Resp:  [18-20] 20 (12/21 0417) BP: (93-102)/(53-64) 101/62 (12/21 0417) SpO2:  [95 %-99 %] 95 % (12/21 0417) Weight:  [197 lb 12.8 oz (89.7 kg)] 197 lb 12.8 oz (89.7 kg) (12/21 0417) Last BM Date: 12/04/16  Weight change: Filed Weights   12/03/16 0449 12/04/16 0522 12/05/16 0417  Weight: 212 lb 1.6 oz (96.2 kg) 203 lb (92.1 kg) 197 lb 12.8 oz (89.7 kg)   Intake/Output:   Intake/Output Summary (Last 24 hours) at 12/05/16 1049 Last data filed at 12/05/16 0949  Gross per 24 hour  Intake             1080 ml  Output             4700 ml  Net            -3620 ml    Physical Exam: General:  Elderly. NAD HEENT: Normal Neck: supple. JVP 7-8 cm. Carotids 2+ bilat; no bruits. No thyromegaly or nodule noted.  Cor: PMI nondisplaced. Irregularly irregular. No M/G/R noted.  Lungs: Slightly decreased basilar sounds.  Abdomen:obese, soft, NT, ND, no HSM. No bruits or masses. +BS   Extremities: no cyanosis, clubbing, rash, tr-1+ edema to knees.  Chronic venous stasis changes Neuro: alert & orientedx3, cranial nerves grossly intact. moves all 4 extremities w/o difficulty. Affect pleasant  Telemetry:  Reviewed, Afib with V pacing, occasional PVCs.   Labs: CBC  Recent Labs  12/04/16 0452 12/05/16 0340  WBC 4.1 4.2  HGB 13.3 12.1  HCT 39.8 36.9  MCV 93.2 93.4  PLT 103* 102*   Basic Metabolic Panel  Recent  Labs  40/98/1112/19/17 0400 12/04/16 0452 12/05/16 0340  NA 138 135 135  K 3.0* 3.7 3.4*  CL 96* 93* 90*  CO2 31 31 35*  GLUCOSE 99 88 95  BUN 22* 26* 30*  CREATININE 0.98 1.15* 1.24*  CALCIUM 9.4 9.8 9.8  MG 2.0  --   --    Liver Function Tests No results for input(s): AST, ALT, ALKPHOS, BILITOT, PROT, ALBUMIN in the last 72 hours. No results for input(s): LIPASE, AMYLASE in the last 72 hours. Cardiac Enzymes No results for input(s): CKTOTAL, CKMB, CKMBINDEX, TROPONINI in the last 72 hours.  BNP: BNP (last 3 results)  Recent Labs  09/15/16 1124 09/30/16 1606 11/28/16 1858  BNP 1,140.6* 927.9* 909.9*    ProBNP (last 3 results) No results for input(s): PROBNP in the last 8760 hours.   D-Dimer No results for input(s): DDIMER in the last 72 hours. Hemoglobin A1C No results for input(s): HGBA1C in the last 72 hours. Fasting Lipid Panel No results for input(s): CHOL, HDL, LDLCALC, TRIG, CHOLHDL, LDLDIRECT in the last 72 hours. Thyroid Function Tests No results for input(s): TSH, T4TOTAL, T3FREE, THYROIDAB in the last 72 hours.  Invalid input(s): FREET3  Other results:  Imaging/Studies:  No results found.  Medications:    Scheduled Medications: . amiodarone  200 mg Oral BID  . cephALEXin  500 mg Oral Q8H  . furosemide  80 mg Intravenous BID  . metolazone  5 mg Oral BID  . pantoprazole  40 mg Oral Daily  . PHENobarbital  64.8 mg Oral BID  . potassium chloride  40 mEq Oral BID  . sodium chloride flush  3 mL Intravenous Q12H  . spironolactone  25 mg Oral Daily  . warfarin  10 mg Oral Once per day on Mon Thu  . warfarin  5 mg Oral Once per day on Sun Tue Wed Fri Sat  . Warfarin - Pharmacist Dosing Inpatient   Does not apply q1800    PRN Medications: sodium chloride, acetaminophen, fluticasone, ondansetron (ZOFRAN) IV, sodium chloride flush  Assessment/Plan   1. A/C Biventricular Heart Failure- NICM. LHC norm cors 09/2016. ECHO 09/2016 EF 30-35% RV moderately  dilated.  - Volume improving. She is nearing perceived baseline and creatinine is trending up slightly.  - Continue IV lasix 80 mg BID and metolazone 5 mg BID for today.  Possibly to po tomorrow vs Saturday.  - Continue spironolactone 25 mg daily.  2. Frequent PVCs -possible PVC cardiomyopathy. - Stable on amiodarone 200 mg BID.  Still having occasional PVCs. 3. Chronic A fib - Stable. No bleeding on coumadin.  - Continue coumadin. Pharmacy dosing.  4. Hypokalemia - Supp with 40 meq BID.   - Continue to follow closely.   5. Obesity  - Needs to eat healthier, watch portions, and increase activity.  - Physical therapy following.   Continues to diurese. Volume status improving. Likely home in 24-48 hours.    Length of Stay: [address]  Graciella Freer, [place]  12/05/2016, 10:49 AM  Advanced Heart Failure Team Pager [phone] (M-F; 7a - 4p)  Please contact [facility] for night-coverage after hours (4p -7a ) and weekends on [email or web address]  .Patient seen and examined with Otilio Saber, PA-C. We discussed all aspects of the encounter. I agree with the assessment and plan as stated above.   Volume status about as good as we are going to get it.  BP soft. Creatinine up slightly. Will stop IV diuretics. Transition to po in am. Repeat echo now that she is diuresed to see if EF improving with suppression of PVCs. Hopefully home tomorrow or Saturday.  [name], Daniel,MD 8:49 PM

## 2016-12-05 NOTE — Progress Notes (Signed)
Patient with low blood pressure of 88/58 manually, family med residents paged, patient is lethargic and drowsy, will continue to monitor Stanford Breed RN 12:56 PM 12-05-2016

## 2016-12-05 NOTE — Care Management Note (Signed)
Case Management Note  Patient Details  Name: Misty Gray MRN: [id number] Date of Birth: 02/19/1951  Subjective/Objective:       Admitted with CHF             Action/Plan: Patient well know to CM from previous admission; Primary Care Provider: Uvaldo Rising, MD; lives at home with her spouse; has home oxygen through [facility]; private insurance with [facility] with prescription drug coverage; pt reports no problems getting medication; HHC choice offered, pt requested [facility]; Lupita Leash with [facility] called for arrangements; At discharge Attending MD please enter the face to face document in Epic for [facility] services; CM will continue to follow for DCP  Expected Discharge Date:    possibly 12/07/2016              Expected Discharge Plan:  Home w Home Health Services  In-House Referral:   [facility]  Discharge planning Services  CM Consult     Choice offered to:  Patient    HH Arranged:  RN, Disease Management, PT, OT, Nurse's Aide HH Agency:  [facility]  Status of Service:  In process, will continue to follow  Reola Mosher [phone] 12/05/2016, 10:07 AM

## 2016-12-05 NOTE — Progress Notes (Signed)
Family Medicine Teaching Service Daily Progress Note Intern Pager: [phone]  [name] Medical record number: [id number] Date of birth: 03-14-1951 Age: 65 y.o. Gender: female  Primary Care Provider: Uvaldo RisingFLETKE, KYLE, J, MD Consultants: None Code Status: Full Code  Assessment and Plan: 65 y.o. female presenting with shortness of breath. PMH is significant for HFrEF (EF 30-35%), A-fib (on Coumadin), HTN, chronic venous insufficiency.  Acute exacerbation of HFrEF, improving:    - metolazone to 5mg  BID - Strict I/Os: Output -4.6 L 24hrs  - Daily weights: Weight 203>>197 pounds - Lasix 80mg  IV BID  -  spironolactone 25mg . Holding Losartan - HF team consulted; follow up recommendations. Considering biventricular pacemaker. - Continue IV diuresis.  - OT recommended no follow up - PT recommended home PT  UTI:  Urine culture positive for E. Coli, pan sensitive.  - keflex day 5 (total 6 days abx). Will complete 7 days total  Hypotension with history of HTN and Hypotension, stable:  BP to 101/62 this AM.  - Continue to monitor - Spironolactone 25mg  daily - holding losartan  Mild AKI: Baseline Cr appears to be about 1 and was elevated to 1.12 upon admission.  Was 1.24 this AM. Very likely due to lasix.  - AM BMP  - Continue to monitor  Hypokalemia: 3.4 this AM. - 40kdur BID - AM BMP  FEN/GI: Heart healthy diet with 2 L fluid restriction/PPI Prophylaxis: Warfarin per pharmacy, INR appropriate  Disposition: discharge pending medical improvement Subjective:  Continue to feel better. Denies CP, SOB, NVD or dysuria.   Objective: Temp:  [97.8 F (36.6 C)-98.6 F (37 C)] 98.3 F (36.8 C) (12/21 0417) Pulse Rate:  [63-70] 69 (12/21 0417) Resp:  [18-20] 20 (12/21 0417) BP: (93-102)/(53-64) 101/62 (12/21 0417) SpO2:  [95 %-99 %] 95 % (12/21 0417) Weight:  [197 lb 12.8 oz (89.7 kg)] 197 lb 12.8 oz (89.7 kg) (12/21 0417) Physical Exam: General: 65 year old female  in chair with legs elevated, resting comfortable ENTM: Moist Mucous Membranes Cardiovascular: irregularly irregular rhythm, regular rate Respiratory: NWOB, faint bibasilar crackles, no wheezing or rhonchi, no increased work of breathing Gastrointestinal: obese abdomen with well-healed transverse scar, soft, NTND Ext: 2+ edema to knees bilaterally Neuro:  AAOx3 Psych: Normal mood and affect  Laboratory:  Recent Labs Lab 12/03/16 0400 12/04/16 0452 12/05/16 0340  WBC 3.9* 4.1 4.2  HGB 12.2 13.3 12.1  HCT 36.3 39.8 36.9  PLT 100* 103* 102*    Recent Labs Lab 11/28/16 1454  12/03/16 0400 12/04/16 0452 12/05/16 0340  NA 137  < > 138 135 135  K 3.3*  < > 3.0* 3.7 3.4*  CL 101  < > 96* 93* 90*  CO2 26  < > 31 31 35*  BUN 17  < > 22* 26* 30*  CREATININE 1.12*  < > 0.98 1.15* 1.24*  CALCIUM 9.4  < > 9.4 9.8 9.8  PROT 7.7  --   --   --   --   BILITOT 1.6*  --   --   --   --   ALKPHOS 105  --   --   --   --   ALT 17  --   --   --   --   AST 24  --   --   --   --   GLUCOSE 109*  < > 99 88 95  < > = values in this interval not displayed.- BNP 909.9 - LA 1.4 - Phenobarbital  32.8  Imaging/Diagnostic Tests: No results found.  Renne Musca, MD  12/05/2016, 6:55 AM PGY-1, [facility] FPTS Intern pager: [phone], text pages welcome

## 2016-12-05 NOTE — Progress Notes (Signed)
Pt had failure to capture strip.  Informed by central monitor.  Notified Dr.  Gwendolyn Grant .  BP 82/50  Rt. Arm, 80/50 Lt arm and 80/40  Rt. Hand manual.  Amanda Pea, RN.

## 2016-12-05 NOTE — Progress Notes (Signed)
At 1550 BP 84/52 after 250 NS IV bolus.  Notified Dr. Myrtie Soman and instructed to keep monitoring pt.  Also to continue to assist pt to getting 00B/and as needed.  Primary nurse made aware.  Amanda Pea, Charity fundraiser.

## 2016-12-06 ENCOUNTER — Inpatient Hospital Stay (HOSPITAL_COMMUNITY): Payer: Medicare Other

## 2016-12-06 DIAGNOSIS — I509 Heart failure, unspecified: Secondary | ICD-10-CM

## 2016-12-06 DIAGNOSIS — N390 Urinary tract infection, site not specified: Secondary | ICD-10-CM

## 2016-12-06 DIAGNOSIS — I959 Hypotension, unspecified: Secondary | ICD-10-CM

## 2016-12-06 LAB — BASIC METABOLIC PANEL
Anion gap: 9 (ref 5–15)
BUN: 32 mg/dL — AB (ref 6–20)
CALCIUM: 9.8 mg/dL (ref 8.9–10.3)
CO2: 32 mmol/L (ref 22–32)
CREATININE: 1.05 mg/dL — AB (ref 0.44–1.00)
Chloride: 94 mmol/L — ABNORMAL LOW (ref 101–111)
GFR calc Af Amer: >60 mL/min (ref >60)
GFR, EST NON AFRICAN AMERICAN: 54 mL/min — AB (ref >60)
Glucose, Bld: 94 mg/dL (ref 65–99)
Potassium: 3.5 mmol/L (ref 3.5–5.1)
SODIUM: 135 mmol/L (ref 135–145)

## 2016-12-06 LAB — CBC
HEMATOCRIT: 37.5 % (ref 36.0–46.0)
HEMOGLOBIN: 12.3 g/dL (ref 12.0–15.0)
MCH: 30.8 pg (ref 26.0–34.0)
MCHC: 32.8 g/dL (ref 30.0–36.0)
MCV: 93.8 fL (ref 78.0–100.0)
PLATELETS: 109 10*3/uL — AB (ref 150–400)
RBC: 4 MIL/uL (ref 3.87–5.11)
RDW: 17.4 % — ABNORMAL HIGH (ref 11.5–15.5)
WBC: 3.7 10*3/uL — AB (ref 4.0–10.5)

## 2016-12-06 LAB — ECHOCARDIOGRAM LIMITED
Height: 62 in
Weight: 3121.6 oz

## 2016-12-06 LAB — PROTIME-INR
INR: 1.94
Prothrombin Time: 22.4 seconds — ABNORMAL HIGH (ref 11.4–15.2)

## 2016-12-06 MED ORDER — TORSEMIDE 20 MG PO TABS: ORAL_TABLET | ORAL | 5 refills | Status: DC

## 2016-12-06 MED ORDER — WARFARIN SODIUM 5 MG PO TABS: 5.0000 mg | ORAL_TABLET | ORAL | Status: DC

## 2016-12-06 MED ORDER — WARFARIN SODIUM 7.5 MG PO TABS
7.5000 mg | ORAL_TABLET | Freq: Once | ORAL | Status: AC
  Administered 2016-12-06: 7.5 mg via ORAL
  Filled 2016-12-06: qty 1

## 2016-12-06 MED ORDER — WARFARIN SODIUM 5 MG PO TABS: ORAL_TABLET | ORAL | 1 refills | Status: DC

## 2016-12-06 MED ORDER — TORSEMIDE 20 MG PO TABS
40.0000 mg | ORAL_TABLET | Freq: Every day | ORAL | Status: DC
  Filled 2016-12-06: qty 2

## 2016-12-06 MED ORDER — PERFLUTREN LIPID MICROSPHERE
1.0000 mL | INTRAVENOUS | Status: AC | PRN
  Administered 2016-12-06: 2 mL via INTRAVENOUS
  Filled 2016-12-06: qty 10

## 2016-12-06 MED ORDER — TORSEMIDE 20 MG PO TABS
20.0000 mg | ORAL_TABLET | Freq: Every day | ORAL | Status: DC
  Administered 2016-12-06: 20 mg via ORAL
  Filled 2016-12-06: qty 1

## 2016-12-06 MED ORDER — POTASSIUM CHLORIDE CRYS ER 20 MEQ PO TBCR: 40.0000 meq | EXTENDED_RELEASE_TABLET | Freq: Every day | ORAL | 2 refills | Status: DC

## 2016-12-06 MED ORDER — SPIRONOLACTONE 25 MG PO TABS: 25.0000 mg | ORAL_TABLET | Freq: Every day | ORAL | 2 refills | Status: DC

## 2016-12-06 MED ORDER — TORSEMIDE 20 MG PO TABS: 40.0000 mg | ORAL_TABLET | Freq: Two times a day (BID) | ORAL | Status: DC

## 2016-12-06 NOTE — Progress Notes (Signed)
Transitions of Care Pharmacy Note  Plan:  Educated on warfarin, torsemide, amiodarone, phenobarbital, sodium and fluid restriction Addressed concerns regarding large potassium tablets Recommend continued reinforcement of checking daily weights and strict medication adherence. Follow-up: family medicine outpatient clinic in 6 days --------------------------------------------- Misty Gray is an 65 y.o. female who presents with a chief complaint shortness of breath. In anticipation of discharge, pharmacy has reviewed this patient's prior to admission medication history, as well as current inpatient medications listed per the [facility].  Current medication indications, dosing, frequency, and notable side effects reviewed with patient and family. patient and family verbalized understanding of current inpatient medication regimen and are aware that the After Visit Summary when presented, will represent the most accurate medication list at discharge.   Misty Gray expressed concerns regarding the size of her potassium tablets. When discussing non-pharmacological interventions, she emphatically stated that she bakes or broils all foods and avoids salt when at all possible.  She also expresses compliance to her medications and attests to using a pill box to keep the medications organized. She does express concerns for the amount of medications she is on; however, she affirms adherence and acknowledges the role of each medication.   We discussed the indications for each medication, as well as the importance of monitoring for signs and symptoms of bleeding with warfarin therapy. She was able to recall most assessment questions regarding warfarin, and only needed reinforcement about the risk of falls and changes in vision/acute headaches.   She states that she came in due to a fall. Her blood pressure has been consistently low this admission; however, cardiology desires to keep her current regimen. Additionally,  her phenobarbital is slightly elevated at 32.8 (goal 15-30). Phenobarbital can cause CNS side effects that could contribute to falls. Family medicine is aware and has a follow-up appointment scheduled with her in 6 days.   Assessment: Understanding of regimen: good Understanding of indications: good Potential of compliance: good Barriers to Obtaining Medications: No  Patient instructed to contact inpatient pharmacy team with further questions or concerns if needed.    Time spent preparing for discharge counseling: 15 minutes Time spent counseling patient: 35 minutes   Thank you for allowing pharmacy to be a part of this patient's care.  Alfredo Bach, Cleotis Nipper, PharmD Clinical Pharmacy Resident [phone] (Pager) 12/06/2016 5:34 PM

## 2016-12-06 NOTE — Progress Notes (Signed)
At 1824 all d/c instructions explained and given to pt.  Verbalized understanding.  D/c off floor to awaiting transport.  Amanda Pea, Charity fundraiser.

## 2016-12-06 NOTE — Progress Notes (Signed)
Family Medicine Teaching Service Daily Progress Note Intern Pager: [phone]  [name] Medical record number: [id number] Date of birth: 1951/08/12 Age: 65 y.o. Gender: female  Primary Care Provider: Uvaldo Rising, MD Consultants: None Code Status: Full Code  Assessment and Plan: 65 y.o. female presenting with shortness of breath. PMH is significant for HFrEF (EF 30-35%), A-fib (on Coumadin), HTN, chronic venous insufficiency.  #Acute exacerbation of HFrEF, improving --Follow up on ECHO  --Discontinue metolazone to 5mg  BID --Daily weights: Weight 197>>195pounds  --Transition to Torsemide 40 mg am and 20 mg po  --Continue Spironolactone 25 mg --Hold Losartan --HF team following, appreciate recommendations --OT recommended no follow up --PT recommended home PT --Strict I/Os: Output -3.9 L 24hrs   #UTI, resolving  Urine culture positive for E. Coli, pan sensitive.  -- Continue Keflex 500 mg q8 DAY 7(7 day course) receive ceftriaxone prior to starting Keflex.  #Hypotension, stable  BP to 109/57  --Continue to monitor --Continue Spironolactone 25mg  daily --Continue holding Losartan  # Mild AKI:  Baseline Cr  1.0, elevated to 1.12 upon admission. 1.24 this AM. MOst likely 2/2 to diuresis.  --Follow up on am BMP   #Atrial fibrillation, rate controlled On Coumadin 5 mg daily and amiodarone  INR 1.93.  Asymptomatic and rate controlled in the 70s. Consider biventricular pacemaker.  Goal INR between 2-3 --Warfarin 7.5mg  tonight, at discharge resume 5 mg daily EXCEPT on Mon/Thursday take 10 mg --Continue amiodarone 200 mg BID  # Hypokalemia K 3.5 this am --Replete as needed  FEN/GI: Heart healthy diet with 2 L fluid restriction/PPI Prophylaxis: Warfarin per pharmacy, INR appropriate  Disposition: Anticipate discharge on 12/23   Subjective:  Patient reports having a good night, she is tolerating po and has been ambulating. She denies dizziness, n/v,  abdominal pain and chest pain.  Objective: Temp:  [97.6 F (36.4 C)-98.1 F (36.7 C)] 98.1 F (36.7 C) (12/22 0432) Pulse Rate:  [68-74] 72 (12/22 0432) Resp:  [18-20] 20 (12/22 0432) BP: (83-109)/(52-70) 109/57 (12/22 0432) SpO2:  [95 %-100 %] 95 % (12/22 0432) Weight:  [195 lb 1.6 oz (88.5 kg)] 195 lb 1.6 oz (88.5 kg) (12/22 0432)  Physical Exam: General: 65 year old female in chair with legs elevated, resting comfortable ENTM: Moist Mucous Membranes Cardiovascular: irregularly irregular rhythm, regular rate Respiratory: NWOB, faint bibasilar crackles, no wheezing or rhonchi, no increased work of breathing Gastrointestinal: obese abdomen with well-healed transverse scar, soft, NTND Ext: 1+ edema below the knee bilaterally, erythema secondary to venous stasis Neuro:  AAOx3 Psych: Normal mood and affect  Laboratory:  Recent Labs Lab 12/04/16 0452 12/05/16 0340 12/06/16 0524  WBC 4.1 4.2 3.7*  HGB 13.3 12.1 12.3  HCT 39.8 36.9 37.5  PLT 103* 102* 109*    Recent Labs Lab 12/03/16 0400 12/04/16 0452 12/05/16 0340  NA 138 135 135  K 3.0* 3.7 3.4*  CL 96* 93* 90*  CO2 31 31 35*  BUN 22* 26* 30*  CREATININE 0.98 1.15* 1.24*  CALCIUM 9.4 9.8 9.8  GLUCOSE 99 88 95    Imaging/Diagnostic Tests: No results found.  Lovena Neighbours, MD  12/06/2016, 6:24 AM PGY-1, [facility] Family [facility] FPTS Intern pager: [phone], text pages welcome

## 2016-12-06 NOTE — Progress Notes (Signed)
Occupational Therapy Treatment Patient Details Name: Misty Gray MRN: [id number] DOB: 1951/02/11 Today's Date: 12/06/2016    History of present illness 65 y.o. female presenting with shortness of breath. PMH is significant for HFrEF (EF 30-35%), A-fib (on Coumadin), HTN, chronic venous insufficiency. AMS.    OT comments  Pt making progress with functional goals. OT will continue to follow  Follow Up Recommendations  Home health OT;Supervision - Intermittent    Equipment Recommendations  None recommended by OT    Recommendations for Other Services      Precautions / Restrictions Precautions Precautions: Fall Restrictions Weight Bearing Restrictions: No       Mobility Bed Mobility               General bed mobility comments: pt in chair upon arrival  Transfers Overall transfer level: Needs assistance Equipment used: Rolling walker (2 wheeled) Transfers: Sit to/from Stand Sit to Stand: Supervision         General transfer comment: pt required increased time, supervision for safety    Balance Overall balance assessment: Needs assistance Sitting-balance support: Feet supported;No upper extremity supported Sitting balance-Leahy Scale: Good     Standing balance support: During functional activity;Single extremity supported Standing balance-Leahy Scale: Poor Standing balance comment: pt reliant on at least one UE on RW                   ADL Overall ADL's : Needs assistance/impaired     Grooming: Wash/dry hands;Standing;Supervision/safety;Wash/dry face       Lower Body Bathing: Minimal assistance;Sit to/from stand;Min guard (simulated)       Lower Body Dressing: Minimal assistance;Sit to/from stand;Sitting/lateral leans (simulated)       Toileting- Clothing Manipulation and Hygiene: Supervision/safety;Sit to/from stand Toileting - Clothing Manipulation Details (indicate cue type and reason): pt stood for hygiene     Functional mobility  during ADLs: Supervision/safety;Rolling walker                                        Cognition   Behavior During Therapy: WFL for tasks assessed/performed Overall Cognitive Status: Within Functional Limits for tasks assessed                       Extremity/Trunk Assessment   WFL                        General Comments  Pt very pleasant and cooperative    Pertinent Vitals/ Pain       Pain Assessment: No/denies pain                                                          Frequency  Min 2X/week        Progress Toward Goals  OT Goals(current goals can now be found in the care plan section)  Progress towards OT goals: Progressing toward goals  Acute Rehab OT Goals Patient Stated Goal: to get better and go home  Plan Discharge plan remains appropriate                     End of Session Equipment Utilized During Treatment: Rolling walker  Activity Tolerance Patient tolerated treatment well   Patient Left in chair;with call bell/phone within reach   Nurse Communication          Time: [id number] OT Time Calculation (min): 15 min  Charges: OT General Charges $OT Visit: 1 Procedure OT Treatments $Self Care/Home Management : 8-22 mins  Galen ManilaSpencer, [place]ucl[place] Cassetta Jeanette 12/06/2016, 12:07 PM

## 2016-12-06 NOTE — Progress Notes (Signed)
ANTICOAGULATION CONSULT NOTE - Follow Up Consult  Pharmacy Consult for Warfarin  Indication: atrial fibrillation  Patient Measurements: Height: 5\' 2"  (157.5 cm) Weight: 195 lb 1.6 oz (88.5 kg) (scale b) IBW/kg (Calculated) : 50.1  Vital Signs: Temp: 98.1 F (36.7 C) (12/22 0432) Temp Source: Oral (12/22 0432) BP: 109/57 (12/22 0432) Pulse Rate: 72 (12/22 0432)  Labs:  Recent Labs  12/04/16 0452 12/05/16 0340 12/06/16 0524  HGB 13.3 12.1 12.3  HCT 39.8 36.9 37.5  PLT 103* 102* 109*  LABPROT 23.2*  --  22.4*  INR 2.03  --  1.94  CREATININE 1.15* 1.24* 1.05*    Estimated Creatinine Clearance: 53.8 mL/min (by C-G formula based on SCr of 1.05 mg/dL (H)).   Assessment: 65yo female admitted from [facility] for AMS, CT head stable from previous exam. Also had shortness of breath in setting of heart failure (19 lb weight gain over past 2 weeks).   INR stable on home dose, slightly below goal this am, only checking INRs three times a week. Will give booster dose today prior to d/c but would resume home dose at d/c.   Goal of Therapy:  INR 2-3 Monitor platelets by anticoagulation protocol: Yes   Plan:  -Warfarin 7.5mg  tonight then resume home regimen at discharge: 5 mg daily except 10 mg on Mon/Thurs -Monitor for bleeding  Sheppard Coil PharmD., BCPS Clinical Pharmacist Pager [phone] 12/06/2016 9:48 AM

## 2016-12-06 NOTE — Progress Notes (Signed)
Pt's B/P 90/70, Dr. Chanetta Marshall notified, stated to give amiodarone and Luminal as scheduled, will continue to monitor.

## 2016-12-06 NOTE — Care Management Important Message (Signed)
Important Message  Patient Details  Name: Misty Gray MRN: [id number] Date of Birth: October 08, 1951   Medicare Important Message Given:  Yes    Hykeem Ojeda Stefan Church 12/06/2016, 10:29 AM

## 2016-12-06 NOTE — Progress Notes (Signed)
Advanced Heart Failure Rounding Note  Referring Physician: Dr Leveda Anna  Primary Cardiologist:  Dr Gala Romney   Subjective:    Admitted 11/28/16 with volume overloaded.  Yesterday IV lasix and metolazone stopped. Overall weight down 29 pounds.   Denies SOB     Objective:   Weight Range: 195 lb 1.6 oz (88.5 kg) Body mass index is 35.68 kg/m.   Vital Signs:   Temp:  [97.6 F (36.4 C)-98.1 F (36.7 C)] 98.1 F (36.7 C) (12/22 0432) Pulse Rate:  [68-74] 72 (12/22 0432) Resp:  [18-20] 20 (12/22 0432) BP: (83-109)/(52-70) 109/57 (12/22 0432) SpO2:  [95 %-100 %] 95 % (12/22 0432) Weight:  [195 lb 1.6 oz (88.5 kg)] 195 lb 1.6 oz (88.5 kg) (12/22 0432) Last BM Date: 12/04/16  Weight change: Filed Weights   12/04/16 0522 12/05/16 0417 12/06/16 0432  Weight: 203 lb (92.1 kg) 197 lb 12.8 oz (89.7 kg) 195 lb 1.6 oz (88.5 kg)   Intake/Output:   Intake/Output Summary (Last 24 hours) at 12/06/16 0933 Last data filed at 12/06/16 0529  Gross per 24 hour  Intake              410 ml  Output             3950 ml  Net            -3540 ml    Physical Exam: General:  Elderly. NAD. Sitting in the chair.  HEENT: Normal Neck: supple. JVP 8-9 cm. Carotids 2+ bilat; no bruits. No thyromegaly or nodule noted.  Cor: PMI nondisplaced. Irregularly irregular. No M/G/R noted.  Lungs: Slightly decreased basilar sounds.  Abdomen:obese, soft, NT, ND, no HSM. No bruits or masses. +BS   Extremities: no cyanosis, clubbing, rash, tr-1+ edema to knees.  Chronic venous stasis changes Neuro: alert & orientedx3, cranial nerves grossly intact. moves all 4 extremities w/o difficulty. Affect pleasant  Telemetry:  Afib with V pacing, occasional PVCs 60s  Labs: CBC  Recent Labs  12/05/16 0340 12/06/16 0524  WBC 4.2 3.7*  HGB 12.1 12.3  HCT 36.9 37.5  MCV 93.4 93.8  PLT 102* 109*   Basic Metabolic Panel  Recent Labs  12/05/16 0340 12/06/16 0524  NA 135 135  K 3.4* 3.5  CL 90* 94*  CO2  35* 32  GLUCOSE 95 94  BUN 30* 32*  CREATININE 1.24* 1.05*  CALCIUM 9.8 9.8   Liver Function Tests No results for input(s): AST, ALT, ALKPHOS, BILITOT, PROT, ALBUMIN in the last 72 hours. No results for input(s): LIPASE, AMYLASE in the last 72 hours. Cardiac Enzymes No results for input(s): CKTOTAL, CKMB, CKMBINDEX, TROPONINI in the last 72 hours.  BNP: BNP (last 3 results)  Recent Labs  09/15/16 1124 09/30/16 1606 11/28/16 1858  BNP 1,140.6* 927.9* 909.9*    ProBNP (last 3 results) No results for input(s): PROBNP in the last 8760 hours.   D-Dimer No results for input(s): DDIMER in the last 72 hours. Hemoglobin A1C No results for input(s): HGBA1C in the last 72 hours. Fasting Lipid Panel No results for input(s): CHOL, HDL, LDLCALC, TRIG, CHOLHDL, LDLDIRECT in the last 72 hours. Thyroid Function Tests No results for input(s): TSH, T4TOTAL, T3FREE, THYROIDAB in the last 72 hours.  Invalid input(s): FREET3  Other results:  Imaging/Studies:  No results found.  Medications:    Scheduled Medications: . amiodarone  200 mg Oral BID  . cephALEXin  500 mg Oral Q8H  . pantoprazole  40 mg Oral Daily  .  PHENobarbital  64.8 mg Oral BID  . potassium chloride  40 mEq Oral BID  . sodium chloride flush  3 mL Intravenous Q12H  . spironolactone  25 mg Oral Daily  . warfarin  10 mg Oral Once per day on Mon Thu  . warfarin  5 mg Oral Once per day on Sun Tue Wed Fri Sat  . Warfarin - Pharmacist Dosing Inpatient   Does not apply q1800    PRN Medications: sodium chloride, acetaminophen, fluticasone, ondansetron (ZOFRAN) IV, sodium chloride flush  Assessment/Plan   1. A/C Biventricular Heart Failure- NICM. LHC norm cors 09/2016. ECHO 09/2016 EF 30-35% RV moderately dilated.  - Volume improved. Off IV lasix.  Transition to torsemide 40 mg in am and 20 mg in pm.    - Continue spironolactone 25 mg daily.  ECHO pending.  2. Frequent PVCs -possible PVC cardiomyopathy. - Stable  on amiodarone 200 mg BID.  Still having occasional PVCs. 3. Chronic A fib - Stable. No bleeding on coumadin.  - Continue coumadin. Pharmacy dosing.  4. Hypokalemia - Supp with 40 meq BID.   - Continue to follow closely.   5. Obesity  - Needs to eat healthier, watch portions, and increase activity.  - Physical therapy following.   Can go home after ECHO.   D/C  Torsemide 40 mg in am and 20 mg pm Potassium 40 meq daily.  Spiro 25 mg daily  Amio 200 mg daily   HF follow up 12/20/2015 at 10:20     Length of Stay: 8  [name], NP  12/06/2016, 9:33 AM  Advanced Heart Failure Team Pager [phone] (M-F; 7a - 4p)  Please contact [facility] for night-coverage after hours (4p -7a ) and weekends on [email or web address]  Patient seen and examined with Tonye BecketAmy Clegg, NP. We discussed all aspects of the encounter. I agree with the assessment and plan as stated above.   Volume status much improved. Can go home today on above regimen. Echo done today viewed personally and EF 15-20% despite PVC suppression. I am concerned that worsening LV function may be related to RV pacing. Will need to refer to EP to consider CRT upgrade.   Maryl Blalock,MD 7:15 PM

## 2016-12-06 NOTE — Progress Notes (Signed)
  Echocardiogram 2D Echocardiogram with Definity has been performed.  Cathie Beams 12/06/2016, 1:54 PM

## 2016-12-06 NOTE — Progress Notes (Signed)
Physical Therapy Treatment Patient Details Name: Misty GrayDolly J Gray MRN: [id number] DOB: 1951/05/19 Today's Date: 12/06/2016    History of Present Illness 65 y.o. female presenting with shortness of breath. PMH is significant for HFrEF (EF 30-35%), A-fib (on Coumadin), HTN, chronic venous insufficiency. AMS.     PT Comments    Pt presented sitting OOB in recliner when PT entered room. Pt making steady progress with mobility. Pt would continue to benefit from skilled physical therapy services at this time while admitted and after d/c to address her limitations in order to improve her overall safety and independence with functional mobility.   Follow Up Recommendations  Home health PT;Supervision for mobility/OOB     Equipment Recommendations  None recommended by PT    Recommendations for Other Services       Precautions / Restrictions Precautions Precautions: Fall Restrictions Weight Bearing Restrictions: No    Mobility  Bed Mobility               General bed mobility comments: pt in chair upon arrival  Transfers Overall transfer level: Needs assistance Equipment used: Rolling walker (2 wheeled) Transfers: Sit to/from Stand Sit to Stand: Supervision         General transfer comment: pt required increased time, supervision for safety  Ambulation/Gait Ambulation/Gait assistance: Min guard Ambulation Distance (Feet): 300 Feet Assistive device: Rolling walker (2 wheeled) Gait Pattern/deviations: Step-through pattern;Trunk flexed Gait velocity: decreased Gait velocity interpretation: Below normal speed for age/gender General Gait Details: pt required frequent VC'ing to maintain safe distance from RW and for forward visual gaze.   Stairs            Wheelchair Mobility    Modified Rankin (Stroke Patients Only)       Balance Overall balance assessment: Needs assistance Sitting-balance support: Feet supported;No upper extremity supported Sitting  balance-Leahy Scale: Good     Standing balance support: During functional activity;Single extremity supported Standing balance-Leahy Scale: Poor Standing balance comment: pt reliant on at least one UE on RW                    Cognition Arousal/Alertness: Awake/alert Behavior During Therapy: [facility] for tasks assessed/performed Overall Cognitive Status: Within Functional Limits for tasks assessed                      Exercises      General Comments        Pertinent Vitals/Pain Pain Assessment: No/denies pain    Home Living                      Prior Function            PT Goals (current goals can now be found in the care plan section) Acute Rehab PT Goals Patient Stated Goal: to get better PT Goal Formulation: With patient Time For Goal Achievement: 12/13/16 Potential to Achieve Goals: Good Progress towards PT goals: Progressing toward goals    Frequency    Min 3X/week      PT Plan Current plan remains appropriate    Co-evaluation             End of Session Equipment Utilized During Treatment: Gait belt Activity Tolerance: Patient tolerated treatment well Patient left: in chair;with call bell/phone within reach     Time: 1035-1103 PT Time Calculation (min) (ACUTE ONLY): 28 min  Charges:  $Gait Training: 23-37 mins  G CodesAlessandra Bevels Alif Gray 01-02-17, 11:17 AM Misty Gray, PT, DPT [phone]

## 2016-12-10 ENCOUNTER — Other Ambulatory Visit: Payer: Self-pay | Admitting: *Deleted
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
## 2016-12-11 ENCOUNTER — Other Ambulatory Visit: Payer: Self-pay | Admitting: *Deleted

## 2016-12-11 DIAGNOSIS — Z9981 Dependence on supplemental oxygen: Secondary | ICD-10-CM | POA: Diagnosis not present

## 2016-12-11 DIAGNOSIS — I5022 Chronic systolic (congestive) heart failure: Secondary | ICD-10-CM | POA: Diagnosis not present

## 2016-12-11 DIAGNOSIS — Z7901 Long term (current) use of anticoagulants: Secondary | ICD-10-CM | POA: Diagnosis not present

## 2016-12-11 DIAGNOSIS — E559 Vitamin D deficiency, unspecified: Secondary | ICD-10-CM | POA: Diagnosis not present

## 2016-12-11 DIAGNOSIS — G4730 Sleep apnea, unspecified: Secondary | ICD-10-CM | POA: Diagnosis not present

## 2016-12-11 DIAGNOSIS — I482 Chronic atrial fibrillation: Secondary | ICD-10-CM | POA: Diagnosis not present

## 2016-12-11 DIAGNOSIS — I110 Hypertensive heart disease with heart failure: Secondary | ICD-10-CM | POA: Diagnosis not present

## 2016-12-11 DIAGNOSIS — I872 Venous insufficiency (chronic) (peripheral): Secondary | ICD-10-CM | POA: Diagnosis not present

## 2016-12-11 LAB — POCT INR: INR: 2.3

## 2016-12-12 ENCOUNTER — Inpatient Hospital Stay: Payer: Medicare Other | Admitting: Family Medicine

## 2016-12-12 ENCOUNTER — Encounter: Payer: Self-pay | Admitting: Family Medicine

## 2016-12-12 ENCOUNTER — Ambulatory Visit (INDEPENDENT_AMBULATORY_CARE_PROVIDER_SITE_OTHER): Payer: Medicare Other | Admitting: Family Medicine

## 2016-12-12 VITALS — BP 80/60 | HR 75 | Temp 97.5°F | Ht 62.0 in | Wt 185.8 lb

## 2016-12-12 DIAGNOSIS — Z79899 Other long term (current) drug therapy: Secondary | ICD-10-CM

## 2016-12-12 DIAGNOSIS — I519 Heart disease, unspecified: Secondary | ICD-10-CM

## 2016-12-12 NOTE — Assessment & Plan Note (Addendum)
Patient is here for hospital follow-up after an acute on chronic CHF exacerbation. Patient was aggressively diuresed during her hospital stay. She is currently > 45 pounds down from her previous office visit 12 days ago. She is interacting well. She states that she is feeling well. She states that she been compliant with her medications. However she later stated that she has not yet picked up her new prescription, KDur, so she has not yet taken this.  - I've encouraged her to pickup her potassium supplementation and be taken taking it as prescribed. - Stressed all medication compliance at this point. - Obtained BMP today. We'll mail her these results, or contact her personally if there is anything we need to discuss. - Daily weights. - Stressed regular use of walker at home. - Patient has office visit with cardiology in 7 days. - Patient was asked to follow-up with us/PCP in 2 weeks.  Of Note: Lower extremity chronic skin changes looked significantly improved today.

## 2016-12-12 NOTE — Patient Instructions (Signed)
It was a pleasure seeing you today in our clinic. Today we discussed your hospital follow-up. Here is the treatment plan we have discussed and agreed upon together:   - Today we obtained a blood test. The results of this test will be mailed to you. If there is any concerning results I will contact you personally. - Continue taking your medications as prescribed upon discharge from the hospital. - I would like to have you follow-up with [place] in 2 weeks. Preferably with your primary care provider, Dr. Randolm Idol, but if he is not available then any of our providers will do. - Have a great New Year!

## 2016-12-12 NOTE — Progress Notes (Signed)
TRANSITION OF CARE VISIT   Primary Care Physician (PCP): Dr. Vista MinkFletke                                                    [facility]                                                   [address]                                                   [place], [place] [id number]                                                   [place]    Date of Admission: 11/28/16  Date of Discharge: 12/06/16  [facility]  Discharge Diagnosis: AoCHF, hypokalemia, fall, somnolence, UTI  Summary of Admission:  65 year old female presented to the emergency department with altered mental status x 2 days. [facility] on day of [facility], patient mentioned fall 4 days prior. Patient sent to emergency department for further evaluation given inability to rule out intracranial bleed. CT head showed no acute changes.  Noted to have a 19 pound weight gain over the past 2 weeks secondary to poor diet and greater fluid intake than recommended (directed to have 2L fluid restriction and low salt diet). Patient with increased orthopnea and shortness of breath on admission. BNP was elevated at 909 and chest x-ray showed pulmonary congestion. Heart failure team was consulted and patient was diuresed on 80 mg IV twice a day for a number of days during admission and was brought closer to her dry weight. Patient was started on metolazone 5 mg which was eventually increased to twice a day. Spironolactone was increased to 25 mg daily and her losartan was held during admission due to low blood pressures.  Patient also found to have pansensitive Escherichia coli UTI, for which she completed a 6 Day course of Keflex. Patient denied any urinary symptoms at discharge.   TODAY's VISIT  Patient/Caregiver self-reported problems/concerns:  - states that she doesn't have any issues. - BP is still very low. Asymptomatic. - Has not picked up or begun taking her  potassium supplements.  MEDICATIONS  Medication Reconciliation conducted with patient/caregiver? Yes  New medications prescribed/discontinued upon discharge? Yes: KDur (started); ARB (stopped)  Barriers identified related to medications: Financial and medication compliance  LABS  Lab Reviewed (Yes) - Repeating BMP today. - Pulse ox 94%. - Weight is down to 185.8 pounds. (From 232 pounds prior to admission)  PHYSICAL EXAM:  Gen: NAD, alert, cooperative, and pleasant. Much more interactive and talkative today compared to previous visit. HEENT: NCAT, EOMI, PERRL, MMM CV: RRR, no murmur Resp: Bibasilar crackles noted, no wheezes, non-labored Ext: Bilateral venous stasis changes noted below the knees. +0-1 bilateral edema noted. Significantly improved from previous visit. Neuro: Alert  and oriented, Speech clear, No gross deficits   ASSESSMENT:   Chronic systolic dysfunction of left ventricle Patient is here for hospital follow-up after an acute on chronic CHF exacerbation. Patient was aggressively diuresed during her hospital stay. She is currently > 45 pounds down from her previous office visit 12 days ago. She is interacting well. She states that she is feeling well. She states that she been compliant with her medications. However she later stated that she has not yet picked up her new prescription, KDur, so she has not yet taken this.  - I've encouraged her to pickup her potassium supplementation and be taken taking it as prescribed. - Stressed all medication compliance at this point. - Obtained BMP today. We'll mail her these results, or contact her personally if there is anything we need to discuss. - Daily weights. - Stressed regular use of walker at home. - Patient has office visit with cardiology in 7 days. - Patient was asked to follow-up with us/PCP in 2 weeks.  Of Note: Lower extremity chronic skin changes looked significantly improved today.   Orders Placed This  Encounter  Procedures  . BASIC METABOLIC PANEL WITH GFR     PATIENT EDUCATION PROVIDED: See AVS   FOLLOW-UP (Include any further testing or referrals):  - Following up with cardiology in 7 days. - Patient has to make a follow-up visit in 2 weeks with PCP if available.   Kathee Delton, MD,MS,  PGY3 12/12/2016 2:29 PM

## 2016-12-13 DIAGNOSIS — I482 Chronic atrial fibrillation: Secondary | ICD-10-CM | POA: Diagnosis not present

## 2016-12-13 DIAGNOSIS — I5022 Chronic systolic (congestive) heart failure: Secondary | ICD-10-CM | POA: Diagnosis not present

## 2016-12-13 DIAGNOSIS — E559 Vitamin D deficiency, unspecified: Secondary | ICD-10-CM | POA: Diagnosis not present

## 2016-12-13 DIAGNOSIS — G4730 Sleep apnea, unspecified: Secondary | ICD-10-CM | POA: Diagnosis not present

## 2016-12-13 DIAGNOSIS — I110 Hypertensive heart disease with heart failure: Secondary | ICD-10-CM | POA: Diagnosis not present

## 2016-12-13 DIAGNOSIS — Z7901 Long term (current) use of anticoagulants: Secondary | ICD-10-CM | POA: Diagnosis not present

## 2016-12-13 DIAGNOSIS — Z9981 Dependence on supplemental oxygen: Secondary | ICD-10-CM | POA: Diagnosis not present

## 2016-12-13 DIAGNOSIS — I872 Venous insufficiency (chronic) (peripheral): Secondary | ICD-10-CM | POA: Diagnosis not present

## 2016-12-13 LAB — BASIC METABOLIC PANEL WITH GFR
BUN: 40 mg/dL — AB (ref 7–25)
CHLORIDE: 91 mmol/L — AB (ref 98–110)
CO2: 35 mmol/L — ABNORMAL HIGH (ref 20–31)
CREATININE: 1.16 mg/dL — AB (ref 0.50–0.99)
Calcium: 9.5 mg/dL (ref 8.6–10.4)
GFR, EST AFRICAN AMERICAN: 56 mL/min — AB (ref >=60)
GFR, Est Non African American: 49 mL/min — ABNORMAL LOW (ref >=60)
Glucose, Bld: 92 mg/dL (ref 65–99)
POTASSIUM: 3 mmol/L — AB (ref 3.5–5.3)
SODIUM: 136 mmol/L (ref 135–146)

## 2016-12-16 DIAGNOSIS — I482 Chronic atrial fibrillation: Secondary | ICD-10-CM | POA: Diagnosis not present

## 2016-12-16 DIAGNOSIS — D696 Thrombocytopenia, unspecified: Secondary | ICD-10-CM | POA: Diagnosis not present

## 2016-12-16 DIAGNOSIS — I5023 Acute on chronic systolic (congestive) heart failure: Secondary | ICD-10-CM | POA: Diagnosis not present

## 2016-12-16 DIAGNOSIS — E669 Obesity, unspecified: Secondary | ICD-10-CM | POA: Diagnosis not present

## 2016-12-16 DIAGNOSIS — I110 Hypertensive heart disease with heart failure: Secondary | ICD-10-CM | POA: Diagnosis not present

## 2016-12-16 DIAGNOSIS — G40909 Epilepsy, unspecified, not intractable, without status epilepticus: Secondary | ICD-10-CM | POA: Diagnosis not present

## 2016-12-16 DIAGNOSIS — K219 Gastro-esophageal reflux disease without esophagitis: Secondary | ICD-10-CM | POA: Diagnosis not present

## 2016-12-16 DIAGNOSIS — G4733 Obstructive sleep apnea (adult) (pediatric): Secondary | ICD-10-CM | POA: Diagnosis not present

## 2016-12-16 DIAGNOSIS — I872 Venous insufficiency (chronic) (peripheral): Secondary | ICD-10-CM | POA: Diagnosis not present

## 2016-12-18 ENCOUNTER — Other Ambulatory Visit: Payer: Self-pay | Admitting: *Deleted

## 2016-12-18 ENCOUNTER — Other Ambulatory Visit: Payer: Self-pay | Admitting: Family Medicine

## 2016-12-18 ENCOUNTER — Telehealth (HOSPITAL_COMMUNITY): Payer: Self-pay | Admitting: *Deleted

## 2016-12-18 DIAGNOSIS — D696 Thrombocytopenia, unspecified: Secondary | ICD-10-CM | POA: Diagnosis not present

## 2016-12-18 DIAGNOSIS — K219 Gastro-esophageal reflux disease without esophagitis: Secondary | ICD-10-CM | POA: Diagnosis not present

## 2016-12-18 DIAGNOSIS — G40909 Epilepsy, unspecified, not intractable, without status epilepticus: Secondary | ICD-10-CM | POA: Diagnosis not present

## 2016-12-18 DIAGNOSIS — E669 Obesity, unspecified: Secondary | ICD-10-CM | POA: Diagnosis not present

## 2016-12-18 DIAGNOSIS — I482 Chronic atrial fibrillation: Secondary | ICD-10-CM | POA: Diagnosis not present

## 2016-12-18 DIAGNOSIS — I872 Venous insufficiency (chronic) (peripheral): Secondary | ICD-10-CM | POA: Diagnosis not present

## 2016-12-18 DIAGNOSIS — G4733 Obstructive sleep apnea (adult) (pediatric): Secondary | ICD-10-CM | POA: Diagnosis not present

## 2016-12-18 DIAGNOSIS — I5023 Acute on chronic systolic (congestive) heart failure: Secondary | ICD-10-CM | POA: Diagnosis not present

## 2016-12-18 DIAGNOSIS — I110 Hypertensive heart disease with heart failure: Secondary | ICD-10-CM | POA: Diagnosis not present

## 2016-12-18 LAB — POCT INR: INR: 2.7

## 2016-12-18 NOTE — Telephone Encounter (Signed)
PT from [facility] requested for verbal order to continue home PT with patient for 4 more weeks.  Verbal order given via voicemail.

## 2016-12-18 NOTE — Patient Outreach (Signed)
[facility]) Care Management  12/18/2016  Misty Gray 1951-11-26 [id number]   Notified by care management assistant that member triggered red on EMMI heart failure dashboard.  Call placed to follow up on concerns and for weekly transition of care call.  No answer, HIPAA compliant voice message left.  Will follow up with Gardner Candle, paramedic, regarding contact with member as well as await call back.  Will follow up with member within the next week.  Kemper Durie, [place], MSN [facility] Care Management  [facility] Manager [phone]

## 2016-12-19 ENCOUNTER — Ambulatory Visit (HOSPITAL_COMMUNITY)
Admission: RE | Admit: 2016-12-19 | Discharge: 2016-12-19 | Disposition: A | Discharge status: Home / Self Care | Payer: Medicare Other | Source: Ambulatory Visit | Attending: Internal Medicine | Admitting: Internal Medicine

## 2016-12-19 ENCOUNTER — Ambulatory Visit: Payer: Medicare Other | Admitting: Family Medicine

## 2016-12-19 VITALS — BP 76/54 | HR 69 | Wt 191.4 lb

## 2016-12-19 DIAGNOSIS — Z87891 Personal history of nicotine dependence: Secondary | ICD-10-CM | POA: Diagnosis not present

## 2016-12-19 DIAGNOSIS — I5022 Chronic systolic (congestive) heart failure: Secondary | ICD-10-CM | POA: Diagnosis not present

## 2016-12-19 DIAGNOSIS — I110 Hypertensive heart disease with heart failure: Secondary | ICD-10-CM | POA: Insufficient documentation

## 2016-12-19 DIAGNOSIS — I959 Hypotension, unspecified: Secondary | ICD-10-CM | POA: Insufficient documentation

## 2016-12-19 DIAGNOSIS — Z950 Presence of cardiac pacemaker: Secondary | ICD-10-CM | POA: Insufficient documentation

## 2016-12-19 DIAGNOSIS — I429 Cardiomyopathy, unspecified: Secondary | ICD-10-CM | POA: Diagnosis not present

## 2016-12-19 DIAGNOSIS — Z5189 Encounter for other specified aftercare: Secondary | ICD-10-CM | POA: Diagnosis present

## 2016-12-19 DIAGNOSIS — I482 Chronic atrial fibrillation: Secondary | ICD-10-CM | POA: Insufficient documentation

## 2016-12-19 DIAGNOSIS — I519 Heart disease, unspecified: Secondary | ICD-10-CM

## 2016-12-19 DIAGNOSIS — Z79899 Other long term (current) drug therapy: Secondary | ICD-10-CM | POA: Diagnosis not present

## 2016-12-19 LAB — BASIC METABOLIC PANEL
ANION GAP: 9 (ref 5–15)
BUN: 29 mg/dL — ABNORMAL HIGH (ref 6–20)
CALCIUM: 9.7 mg/dL (ref 8.9–10.3)
CO2: 28 mmol/L (ref 22–32)
Chloride: 99 mmol/L — ABNORMAL LOW (ref 101–111)
Creatinine, Ser: 1.13 mg/dL — ABNORMAL HIGH (ref 0.44–1.00)
GFR, EST AFRICAN AMERICAN: 57 mL/min — AB (ref >60)
GFR, EST NON AFRICAN AMERICAN: 49 mL/min — AB (ref >60)
Glucose, Bld: 109 mg/dL — ABNORMAL HIGH (ref 65–99)
POTASSIUM: 3.3 mmol/L — AB (ref 3.5–5.1)
SODIUM: 136 mmol/L (ref 135–145)

## 2016-12-19 LAB — BRAIN NATRIURETIC PEPTIDE: B NATRIURETIC PEPTIDE 5: 1037.9 pg/mL — AB (ref 0.0–100.0)

## 2016-12-19 MED ORDER — TORSEMIDE 20 MG PO TABS: 20.0000 mg | ORAL_TABLET | Freq: Every day | ORAL | 6 refills | Status: DC
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
## 2016-12-19 NOTE — Patient Instructions (Signed)
Routine lab work today. Will notify you of abnormal results, otherwise no news is good news!  HOLD Torsemide for two days, then Sunday start back at Torsemide 20 mg (1 tablet) once daily.  STOP Spironolactone.  Follow up next week with Otilio Saber PA-C.  Do the following things EVERYDAY: 1) Weigh yourself in the morning before breakfast. Write it down and keep it in a log. 2) Take your medicines as prescribed 3) Eat low salt foods-Limit salt (sodium) to 2000 mg per day.  4) Stay as active as you can everyday 5) Limit all fluids for the day to less than 2 liters

## 2016-12-20 ENCOUNTER — Other Ambulatory Visit: Payer: Self-pay | Admitting: *Deleted

## 2016-12-20 DIAGNOSIS — G4733 Obstructive sleep apnea (adult) (pediatric): Secondary | ICD-10-CM | POA: Diagnosis not present

## 2016-12-20 DIAGNOSIS — I872 Venous insufficiency (chronic) (peripheral): Secondary | ICD-10-CM | POA: Diagnosis not present

## 2016-12-20 DIAGNOSIS — I5023 Acute on chronic systolic (congestive) heart failure: Secondary | ICD-10-CM | POA: Diagnosis not present

## 2016-12-20 DIAGNOSIS — D696 Thrombocytopenia, unspecified: Secondary | ICD-10-CM | POA: Diagnosis not present

## 2016-12-20 DIAGNOSIS — I110 Hypertensive heart disease with heart failure: Secondary | ICD-10-CM | POA: Diagnosis not present

## 2016-12-20 DIAGNOSIS — G40909 Epilepsy, unspecified, not intractable, without status epilepticus: Secondary | ICD-10-CM | POA: Diagnosis not present

## 2016-12-20 DIAGNOSIS — I482 Chronic atrial fibrillation: Secondary | ICD-10-CM | POA: Diagnosis not present

## 2016-12-20 DIAGNOSIS — E669 Obesity, unspecified: Secondary | ICD-10-CM | POA: Diagnosis not present

## 2016-12-20 DIAGNOSIS — K219 Gastro-esophageal reflux disease without esophagitis: Secondary | ICD-10-CM | POA: Diagnosis not present

## 2016-12-20 NOTE — Patient Outreach (Signed)
[facility]) Care Management  12/20/2016  Misty Gray [birth year] [id number]   Weekly transition of care call placed to member, no answer, HIPAA compliant voice message left.  Call received back from son stating that the member was eating and could not answer the phone at the time.  He report that she is doing much better today, confirms office visit yesterday with low blood pressure (accompanied by paramedic).  He state that she was seen today by the home health RN & PT and blood pressure much better, today 88/54.  Member was told to stop Spironolactone and hold Torsemide until Sunday, then restart with one tablet.  They are checking blood pressure daily and recording to take for follow up.  He report that the member remains with his sister as they are still not able to live in their home.  He state that he and his sister have been working with the member on appropriate low sodium diet.  Re-educated on appropriate low sodium foods.  He report that he will transport the member for her follow up appointment next week at the heart failure clinic.  He denies any questions at this time, will follow up next week with home visit.  Kemper Durie, [place], MSN [facility] Care Management  [facility] Manager [phone]

## 2016-12-21 ENCOUNTER — Other Ambulatory Visit: Payer: Self-pay | Admitting: Family Medicine

## 2016-12-23 ENCOUNTER — Other Ambulatory Visit: Payer: Self-pay | Admitting: *Deleted

## 2016-12-23 NOTE — Patient Outreach (Signed)
[facility]) Care Management  12/23/2016  Misty Gray [birth year] [id number]   Notified by care management assistant that member triggered red on EMMI heart failure dashboard on Friday 1/5 for not attending follow up appointment.  This care manager had contact with member & son on Friday, no concerns expressed.  Son confirmed (also confirmed per chart) that member attended follow up appointment on Thursday, 1/4, and continue to have involvement with home health and paramedicine.  This care manager will not make call today but will proceed with home visit on Wednesday as scheduled.  Kemper Durie, [place], MSN [facility] Care Management  [facility] Manager [phone]

## 2016-12-24 DIAGNOSIS — I872 Venous insufficiency (chronic) (peripheral): Secondary | ICD-10-CM | POA: Diagnosis not present

## 2016-12-24 DIAGNOSIS — G4733 Obstructive sleep apnea (adult) (pediatric): Secondary | ICD-10-CM | POA: Diagnosis not present

## 2016-12-24 DIAGNOSIS — I482 Chronic atrial fibrillation: Secondary | ICD-10-CM | POA: Diagnosis not present

## 2016-12-24 DIAGNOSIS — D696 Thrombocytopenia, unspecified: Secondary | ICD-10-CM | POA: Diagnosis not present

## 2016-12-24 DIAGNOSIS — K219 Gastro-esophageal reflux disease without esophagitis: Secondary | ICD-10-CM | POA: Diagnosis not present

## 2016-12-24 DIAGNOSIS — G40909 Epilepsy, unspecified, not intractable, without status epilepticus: Secondary | ICD-10-CM | POA: Diagnosis not present

## 2016-12-24 DIAGNOSIS — I110 Hypertensive heart disease with heart failure: Secondary | ICD-10-CM | POA: Diagnosis not present

## 2016-12-24 DIAGNOSIS — I5023 Acute on chronic systolic (congestive) heart failure: Secondary | ICD-10-CM | POA: Diagnosis not present

## 2016-12-24 DIAGNOSIS — E669 Obesity, unspecified: Secondary | ICD-10-CM | POA: Diagnosis not present

## 2016-12-25 ENCOUNTER — Other Ambulatory Visit: Payer: Self-pay | Admitting: *Deleted

## 2016-12-25 ENCOUNTER — Telehealth (HOSPITAL_COMMUNITY): Payer: Self-pay | Admitting: *Deleted

## 2016-12-25 DIAGNOSIS — I482 Chronic atrial fibrillation: Secondary | ICD-10-CM | POA: Diagnosis not present

## 2016-12-25 DIAGNOSIS — I5023 Acute on chronic systolic (congestive) heart failure: Secondary | ICD-10-CM | POA: Diagnosis not present

## 2016-12-25 DIAGNOSIS — I110 Hypertensive heart disease with heart failure: Secondary | ICD-10-CM | POA: Diagnosis not present

## 2016-12-25 DIAGNOSIS — G40909 Epilepsy, unspecified, not intractable, without status epilepticus: Secondary | ICD-10-CM | POA: Diagnosis not present

## 2016-12-25 DIAGNOSIS — I872 Venous insufficiency (chronic) (peripheral): Secondary | ICD-10-CM | POA: Diagnosis not present

## 2016-12-25 DIAGNOSIS — G4733 Obstructive sleep apnea (adult) (pediatric): Secondary | ICD-10-CM | POA: Diagnosis not present

## 2016-12-25 DIAGNOSIS — K219 Gastro-esophageal reflux disease without esophagitis: Secondary | ICD-10-CM | POA: Diagnosis not present

## 2016-12-25 DIAGNOSIS — D696 Thrombocytopenia, unspecified: Secondary | ICD-10-CM | POA: Diagnosis not present

## 2016-12-25 DIAGNOSIS — E669 Obesity, unspecified: Secondary | ICD-10-CM | POA: Diagnosis not present

## 2016-12-25 LAB — POCT INR: INR: 3

## 2016-12-25 NOTE — Telephone Encounter (Signed)
Acknowledged. Will address at visit tomorrow.   Thanks.   Casimiro Needle [address]" [place], PA-C 12/25/2016 11:34 AM

## 2016-12-25 NOTE — Telephone Encounter (Signed)
Aimee, RN called concerned about a blister to pt's left leg which has busted open, she also reports pt's legs are swollen.  She would like orders for either unna boots or dsg to wound.  Discussed w/Amy Filbert Schilder, NP she recommends if pt has pedal pulses do unna boots if not apply dsg to wound and pt will need ABI's.  Aimee, RN aware, she had a hard time finding pedal pulses and when she did she states they are very faint.  She will apply dsg to wound for now.  Pt is sch for f/u with [place] tomorrow, will address further at that time and order ABI's if needed.  She also states pt decreased her KCL dose since we had decreased her Torsemide and she would like to know what dose pt should continue taking.  Advised we will address at OV tomorrow and get back to her with that information.  She is agreeable and states she can go back and see pt later this week to adjust pill box.  All info forwarded to Otilio Saber, [place] and Caryl Comes, RN as they will be seeing pt tomorrow
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
## 2016-12-26 ENCOUNTER — Ambulatory Visit (HOSPITAL_COMMUNITY)
Admission: RE | Admit: 2016-12-26 | Discharge: 2016-12-26 | Disposition: A | Discharge status: Home / Self Care | Payer: Medicare HMO | Source: Ambulatory Visit | Attending: Cardiology | Admitting: Cardiology

## 2016-12-26 VITALS — BP 116/82 | HR 76 | Wt 197.0 lb

## 2016-12-26 DIAGNOSIS — I5022 Chronic systolic (congestive) heart failure: Secondary | ICD-10-CM | POA: Insufficient documentation

## 2016-12-26 DIAGNOSIS — I872 Venous insufficiency (chronic) (peripheral): Secondary | ICD-10-CM | POA: Diagnosis not present

## 2016-12-26 DIAGNOSIS — Z5189 Encounter for other specified aftercare: Secondary | ICD-10-CM | POA: Diagnosis not present

## 2016-12-26 DIAGNOSIS — I429 Cardiomyopathy, unspecified: Secondary | ICD-10-CM | POA: Insufficient documentation

## 2016-12-26 DIAGNOSIS — I110 Hypertensive heart disease with heart failure: Secondary | ICD-10-CM | POA: Diagnosis not present

## 2016-12-26 DIAGNOSIS — G40909 Epilepsy, unspecified, not intractable, without status epilepticus: Secondary | ICD-10-CM | POA: Diagnosis not present

## 2016-12-26 DIAGNOSIS — Z950 Presence of cardiac pacemaker: Secondary | ICD-10-CM | POA: Diagnosis not present

## 2016-12-26 DIAGNOSIS — Z87891 Personal history of nicotine dependence: Secondary | ICD-10-CM | POA: Insufficient documentation

## 2016-12-26 DIAGNOSIS — Z79899 Other long term (current) drug therapy: Secondary | ICD-10-CM | POA: Diagnosis not present

## 2016-12-26 DIAGNOSIS — I482 Chronic atrial fibrillation, unspecified: Secondary | ICD-10-CM

## 2016-12-26 DIAGNOSIS — I5023 Acute on chronic systolic (congestive) heart failure: Secondary | ICD-10-CM | POA: Diagnosis not present

## 2016-12-26 DIAGNOSIS — Z880 Allergy status to penicillin: Secondary | ICD-10-CM | POA: Diagnosis not present

## 2016-12-26 DIAGNOSIS — I519 Heart disease, unspecified: Secondary | ICD-10-CM

## 2016-12-26 DIAGNOSIS — E669 Obesity, unspecified: Secondary | ICD-10-CM | POA: Diagnosis not present

## 2016-12-26 DIAGNOSIS — I4819 Other persistent atrial fibrillation: Secondary | ICD-10-CM

## 2016-12-26 DIAGNOSIS — I959 Hypotension, unspecified: Secondary | ICD-10-CM

## 2016-12-26 DIAGNOSIS — Z7901 Long term (current) use of anticoagulants: Secondary | ICD-10-CM

## 2016-12-26 DIAGNOSIS — G4733 Obstructive sleep apnea (adult) (pediatric): Secondary | ICD-10-CM | POA: Diagnosis not present

## 2016-12-26 DIAGNOSIS — K219 Gastro-esophageal reflux disease without esophagitis: Secondary | ICD-10-CM | POA: Diagnosis not present

## 2016-12-26 DIAGNOSIS — I878 Other specified disorders of veins: Secondary | ICD-10-CM | POA: Diagnosis not present

## 2016-12-26 DIAGNOSIS — I481 Persistent atrial fibrillation: Secondary | ICD-10-CM

## 2016-12-26 DIAGNOSIS — D696 Thrombocytopenia, unspecified: Secondary | ICD-10-CM | POA: Diagnosis not present

## 2016-12-26 LAB — BASIC METABOLIC PANEL
Anion gap: 8 (ref 5–15)
BUN: 24 mg/dL — AB (ref 6–20)
CALCIUM: 9.7 mg/dL (ref 8.9–10.3)
CHLORIDE: 104 mmol/L (ref 101–111)
CO2: 24 mmol/L (ref 22–32)
Creatinine, Ser: 1.23 mg/dL — ABNORMAL HIGH (ref 0.44–1.00)
GFR calc Af Amer: 51 mL/min — ABNORMAL LOW (ref >60)
GFR, EST NON AFRICAN AMERICAN: 44 mL/min — AB (ref >60)
Glucose, Bld: 108 mg/dL — ABNORMAL HIGH (ref 65–99)
Potassium: 4.1 mmol/L (ref 3.5–5.1)
SODIUM: 136 mmol/L (ref 135–145)

## 2016-12-26 LAB — CUP PACEART REMOTE DEVICE CHECK
Battery Remaining Percentage: 73 %
Battery Voltage: 2.92 V
Brady Statistic RV Percent Paced: 82 %
Implantable Lead Implant Date: 19920327
Implantable Lead Location: 753859
Lead Channel Impedance Value: 480 Ohm
Lead Channel Sensing Intrinsic Amplitude: 4.2 mV
Lead Channel Setting Pacing Amplitude: 2.5 V
Lead Channel Setting Pacing Pulse Width: 0.8 ms
MDC IDC LEAD IMPLANT DT: 19920327
MDC IDC LEAD LOCATION: 753860
MDC IDC MSMT BATTERY REMAINING LONGEVITY: 74 mo
MDC IDC MSMT LEADCHNL RV PACING THRESHOLD AMPLITUDE: 1 V
MDC IDC MSMT LEADCHNL RV PACING THRESHOLD PULSEWIDTH: 0.8 ms
MDC IDC PG IMPLANT DT: 20120605
MDC IDC SESS DTM: 20171128084226
MDC IDC SET LEADCHNL RV SENSING SENSITIVITY: 1.5 mV
Pulse Gen Serial Number: 7254513

## 2016-12-26 LAB — BRAIN NATRIURETIC PEPTIDE: B Natriuretic Peptide: 906.6 pg/mL — ABNORMAL HIGH (ref 0.0–100.0)

## 2016-12-26 NOTE — Addendum Note (Signed)
Encounter addended by: Marcy Siren, LCSW on: 12/26/2016  2:04 PM<BR>    Actions taken: Sign clinical note
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
## 2016-12-26 NOTE — Progress Notes (Signed)
ADVANCED HF CLINIC NOTE   HPI: 66 y/o woman with history of morbid obesity, OSA, chronic atrial fibrillation with tachy-brady syndrome s/p PPM, frequent PVCs, systolic HF due to NICM.  Recently admitted for ADHF in 10/17. Cath with normal coronary arteries and elevated filling pressures. Echo 10/17 EF 30-35% (previously 35-40%). 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 (She has 81% RV pacing and 15-20 PVCs per minute). Started on po amio with suppression of PVCs. Diuresed 41 pounds. Discharge weight 197.   Admitted 12/14 through 12/06/2016 with marked volume overload. Diuresed with IV lasix + metolazone. Discharge weight was 195 pounds.Transitioned to torsemide 40 mg in am and 20 mg in pm.   She returns today for a weekly follow up. Last week torsemide held and then decreased with hypotension.  Feels a lot better. Weight has come up slightly, but now back up to her discharge weight. States she tripped over a drop cord. Breathing feels better. Working with PT at home and can get around the house a lot better. Walked to [employer] and back recently without any SOB. Having difficult weighing at home due to uneven flooring   [facility] 10/03/16 Ao = 94/58 (72) LV = 104/20 RA = 21 RV = 65/4/19 PA = 65/21 (38) PCW = 23 Fick cardiac output/index = 6.1/2.9 PVR = 2.5 WU FA sat = 97%  PA sat = 69%, 70%  Assessment: 1. Normal coronary arteries 2. NICM with EF 30-35% by echo 3. Persistently elevated biventricular pressures    Past Medical History:  Diagnosis Date  . Abnormal CT of the head 12/16/1984   R parietal atrophy  . Allergic rhinitis, cause unspecified   . Anemia   . Arthritis    "hands and knees" (09/06/2015)  . Atrial fibrillation (HCC)   . Cellulitis 09/07/2015  . Cellulitis and abscess of leg 09/2016   bilateral  . CHF (congestive heart failure) (HCC)   . Diaphragmatic hernia without mention of obstruction or gangrene   . Dyspnea   . Exertional dyspnea 06/22/12  . Family  history of adverse reaction to anesthesia    "daughter fights w/them; just can't relax during OR; stay awake during the OR"  . Female stress incontinence   . GERD (gastroesophageal reflux disease)   . Grand mal seizure (HCC)   . H/O hiatal hernia    two removed  . Heart murmur   . High cholesterol   . History of blood transfusion 1956   "related to nose bleed"  . Hypertension   . Influenza A 01/11/2014  . Lichenification and lichen simplex chronicus   . Migraine    "when I was a teenager"  . Morbid obesity (HCC)   . Nonischemic cardiomyopathy (HCC)    EF has normalized-repeat Pending   . On home oxygen therapy    "2L when I'm asleep in bed" (09/06/2015)  . OSA on CPAP   . Pacemaker  st judes    Patient states "this is my third pacemaker"  . Pneumonia 2004; 2011; 11/2011  . Seizures (HCC) since 1981   "can hear you talking but sounds like you are in big tunnel; have them often if not taking RX; last one was 06/17/12" (09/06/2015)  . Shortness of breath 10/24/2010   Qualifier: Diagnosis of  By: [place], Bonnie    . Sick sinus syndrome (HCC)    WITH PRIOR DDD PACEMAKER IMPLANTATION  . Stroke [facility]) 1988   "mouth drawed real bad on my left side; found out it was  a seizure"  . Syncope and collapse 06/22/12   "hit forehead and left knee"; denies loss of consciousness  . Unspecified venous (peripheral) insufficiency     Current Outpatient Prescriptions  Medication Sig Dispense Refill  . amiodarone (PACERONE) 200 MG tablet Take 1 tablet (200 mg total) by mouth 2 (two) times daily. 60 tablet 5  . cholecalciferol (VITAMIN D) 1000 units tablet Take 1,000 Units by mouth daily.    . fluticasone (FLONASE) 50 MCG/ACT nasal spray Place 2 sprays into both nostrils daily as needed. For congestion. 16 g 1  . omeprazole (PRILOSEC) 20 MG capsule Take 1 capsule (20 mg total) by mouth daily. 90 capsule 1  . OXYGEN Inhale 2 L into the lungs at bedtime. cpap machine    . PHENobarbital (LUMINAL) 32.4 MG  tablet Take 2 tablets (64.8 mg total) by mouth 2 (two) times daily. 120 tablet 5  . potassium chloride SA (K-DUR,KLOR-CON) 20 MEQ tablet Take 2 tablets (40 mEq total) by mouth daily. 30 tablet 2  . torsemide (DEMADEX) 20 MG tablet Take 1 tablet (20 mg total) by mouth daily. 30 tablet 6  . warfarin (COUMADIN) 5 MG tablet 2 tablets (10 mg) on Monday and Thursday;  1 tablet (5 mg) all other days 100 tablet 1   No current facility-administered medications for this encounter.     Allergies  Allergen Reactions  . Aspirin Hives and Rash  . Penicillins Rash and Other (See Comments)    Amoxicillin also, Has patient had a PCN reaction causing immediate rash, facial/tongue/throat swelling, SOB or lightheadedness with hypotension: Yes Has patient had a PCN reaction causing severe rash involving mucus membranes or skin necrosis: No Has patient had a PCN reaction that required hospitalization No Has patient had a PCN reaction occurring within the last 10 years: Yes If all of the above answers are "NO", then may proceed with Cephalosporin use.   . Tape Other (See Comments)    NO PAPER TAPE-MUST BE MEDICAL TAPE  . Wool Alcohol [Lanolin] Hives  . Amoxicillin Rash  . Ampicillin Itching and Rash      Social History   Social History  . Marital status: Married    Spouse name: [name]  . Number of children: 4  . Years of education: 12   Occupational History  . disabled    Social History Main Topics  . Smoking status: Former Smoker    Packs/day: 1.00    Years: 7.00    Types: Cigarettes    Quit date: 03/16/1969  . Smokeless tobacco: Never Used  . Alcohol use No  . Drug use: No  . Sexual activity: Not Currently    Birth control/ protection: Post-menopausal   Other Topics Concern  . Not on file   Social History Narrative   Lives with husband [name] lives with them as do his 2 children   Daughter, [name], and her 3 children live in [place] -  [name] is  incarcerated   Nephew, Kandice Schmelter lives with them   Daughter, [name], in [place] [name] [address]   Moved June 2013 to a better home on [facility]      Lives with husband, son, grandson, granddaughter, nephew.      Family History  Problem Relation Age of Onset  . Stroke Father     died from CAD?  Marland Kitchen Heart disease Father   . Cancer Mother   .  Diabetes Son     Type 1  . Sleep apnea Son   . Cancer Sister     cervical  . Hypertension Sister   . Hypertension Brother   . Rheum arthritis Daughter     Also PGM, PGGM    Vitals:   12/26/16 1027  BP: 116/82  Pulse: 76  SpO2: 96%  Weight: 197 lb (89.4 kg)   Wt Readings from Last 3 Encounters:  12/26/16 197 lb (89.4 kg)  12/19/16 191 lb 6.4 oz (86.8 kg)  12/12/16 185 lb 12.8 oz (84.3 kg)    PHYSICAL EXAM:  General: Walked into clinic using walker without difficulty. Son and Lucila Maine present.  HEENT: Normal Neck: supple. JVP 7-8 cm. Carotids 2+ bilat; no bruits. No thyromegaly or nodule noted.  Cor: PMI nonpalpable Somewhat irregular. 2/6 TR Lungs: Slightly decreased basilar sounds.  Abdomen: Obese, + abdominal hernia. Soft, NT, ND, no HSM. No bruits or masses. +BS  Extremities: no cyanosis, clubbing, rash, BLE 1 + edema. Approx 1 cm blister that has ruptured on LLE. Mild serous discharge on dressing.  Neuro: alert & orientedx3, cranial nerves grossly intact. moves all 4 extremities w/o difficulty. Affect pleasant  ASSESSMENT & PLAN: 1. Chronic systolic HF due to NICM. ? PVC induced. Echo 12/06/2016 EF 10-15%. Cath 10/17 normal coronary arteries.   - Repeat echo in 2-3 months to see if EF recovers with PVC suppression. If remains < 35% need to consider CRT-D with chronic pacing. Agree with current plan.  - Continue torsemide 20 mg daily. Take extra 20 mg in evening as needed for weight gain.  - Continue potassium 40 meq BID for now.  Repeat BMEt/BNP today and repeat BMET 1 week via HH.  2. Frequent  PVCs - Continue amio.No change in current plan.  3. Obesity  - Continue to work with PT. Needs to limit salty foods.  4. Hypotension - Much improved. Keep off spiro for now.  - May be able to add back at next visit.  -5. HL 6. Chronic AF with tach brady s/p pacemaker - Continue coumadin. Could consider switch to DOAC in future.  - Denies bleeding.  7. Chronic venous stasis - Place unna boots.  Meds as above. Labs today. Will have [facility] place UNNA boots. Repeat BMET next week via HH with med adjustments.     Graciella Freer, PA-C  10:36 AM  Total time spent > 25 minutes. Over half that spent discussing the above.

## 2016-12-26 NOTE — Patient Instructions (Signed)
No changes to medication today.  Routine lab work today. Will notify you of abnormal results, otherwise no news is good news!  Will have advanced home care nurse Amy wrap legs with Unna boots.  Will have lab work rechecked next Wednesday at home (01/01/2017).  Follow up 2 weeks with Otilio Saber PA-C.  Do the following things EVERYDAY: 1) Weigh yourself in the morning before breakfast. Write it down and keep it in a log. 2) Take your medicines as prescribed 3) Eat low salt foods-Limit salt (sodium) to 2000 mg per day.  4) Stay as active as you can everyday 5) Limit all fluids for the day to less than 2 liters

## 2016-12-26 NOTE — Progress Notes (Signed)
Advanced Heart Failure Medication Review by a Pharmacist  Does the patient  feel that his/her medications are working for him/her?  yes  Has the patient been experiencing any side effects to the medications prescribed?  no  Does the patient measure his/her own blood pressure or blood glucose at home?  yes   Does the patient have any problems obtaining medications due to transportation or finances?   no  Understanding of regimen: good Understanding of indications: good Potential of compliance: good Patient understands to avoid NSAIDs. Patient understands to avoid decongestants.  Issues to address at subsequent visits: None   Pharmacist comments: Misty Gray is a pleasant 66 yo F presenting with her son and grandson. She reports good compliance with her regimen and states that she feels better since her spironolactone was d/c'd and torsemide dose was reduced. No other medication-related questions or concerns for me at this time.   [name]. Bonnye Fava, PharmD, BCPS, CPP Clinical Pharmacist Pager: [phone] Phone: [phone] 12/26/2016 10:37 AM      Time with patient: 10 minutes Preparation and documentation time: 2 minutes Total time: 12 minutes

## 2016-12-27 DIAGNOSIS — I872 Venous insufficiency (chronic) (peripheral): Secondary | ICD-10-CM | POA: Diagnosis not present

## 2016-12-27 DIAGNOSIS — K219 Gastro-esophageal reflux disease without esophagitis: Secondary | ICD-10-CM | POA: Diagnosis not present

## 2016-12-27 DIAGNOSIS — D696 Thrombocytopenia, unspecified: Secondary | ICD-10-CM | POA: Diagnosis not present

## 2016-12-27 DIAGNOSIS — G4733 Obstructive sleep apnea (adult) (pediatric): Secondary | ICD-10-CM | POA: Diagnosis not present

## 2016-12-27 DIAGNOSIS — I482 Chronic atrial fibrillation: Secondary | ICD-10-CM | POA: Diagnosis not present

## 2016-12-27 DIAGNOSIS — E669 Obesity, unspecified: Secondary | ICD-10-CM | POA: Diagnosis not present

## 2016-12-27 DIAGNOSIS — I110 Hypertensive heart disease with heart failure: Secondary | ICD-10-CM | POA: Diagnosis not present

## 2016-12-27 DIAGNOSIS — G40909 Epilepsy, unspecified, not intractable, without status epilepticus: Secondary | ICD-10-CM | POA: Diagnosis not present

## 2016-12-27 DIAGNOSIS — I5023 Acute on chronic systolic (congestive) heart failure: Secondary | ICD-10-CM | POA: Diagnosis not present

## 2016-12-30 DIAGNOSIS — K219 Gastro-esophageal reflux disease without esophagitis: Secondary | ICD-10-CM | POA: Diagnosis not present

## 2016-12-30 DIAGNOSIS — G4733 Obstructive sleep apnea (adult) (pediatric): Secondary | ICD-10-CM | POA: Diagnosis not present

## 2016-12-30 DIAGNOSIS — E669 Obesity, unspecified: Secondary | ICD-10-CM | POA: Diagnosis not present

## 2016-12-30 DIAGNOSIS — I482 Chronic atrial fibrillation: Secondary | ICD-10-CM | POA: Diagnosis not present

## 2016-12-30 DIAGNOSIS — I110 Hypertensive heart disease with heart failure: Secondary | ICD-10-CM | POA: Diagnosis not present

## 2016-12-30 DIAGNOSIS — D696 Thrombocytopenia, unspecified: Secondary | ICD-10-CM | POA: Diagnosis not present

## 2016-12-30 DIAGNOSIS — I872 Venous insufficiency (chronic) (peripheral): Secondary | ICD-10-CM | POA: Diagnosis not present

## 2016-12-30 DIAGNOSIS — I5023 Acute on chronic systolic (congestive) heart failure: Secondary | ICD-10-CM | POA: Diagnosis not present

## 2016-12-30 DIAGNOSIS — G40909 Epilepsy, unspecified, not intractable, without status epilepticus: Secondary | ICD-10-CM | POA: Diagnosis not present

## 2016-12-31 ENCOUNTER — Other Ambulatory Visit: Payer: Self-pay | Admitting: *Deleted

## 2016-12-31 ENCOUNTER — Telehealth: Payer: Self-pay | Admitting: Family Medicine

## 2016-12-31 DIAGNOSIS — I872 Venous insufficiency (chronic) (peripheral): Secondary | ICD-10-CM | POA: Diagnosis not present

## 2016-12-31 DIAGNOSIS — E669 Obesity, unspecified: Secondary | ICD-10-CM | POA: Diagnosis not present

## 2016-12-31 DIAGNOSIS — I110 Hypertensive heart disease with heart failure: Secondary | ICD-10-CM | POA: Diagnosis not present

## 2016-12-31 DIAGNOSIS — I482 Chronic atrial fibrillation: Secondary | ICD-10-CM | POA: Diagnosis not present

## 2016-12-31 DIAGNOSIS — R0602 Shortness of breath: Secondary | ICD-10-CM | POA: Diagnosis not present

## 2016-12-31 DIAGNOSIS — K219 Gastro-esophageal reflux disease without esophagitis: Secondary | ICD-10-CM | POA: Diagnosis not present

## 2016-12-31 DIAGNOSIS — G4733 Obstructive sleep apnea (adult) (pediatric): Secondary | ICD-10-CM | POA: Diagnosis not present

## 2016-12-31 DIAGNOSIS — G40909 Epilepsy, unspecified, not intractable, without status epilepticus: Secondary | ICD-10-CM | POA: Diagnosis not present

## 2016-12-31 DIAGNOSIS — D696 Thrombocytopenia, unspecified: Secondary | ICD-10-CM | POA: Diagnosis not present

## 2016-12-31 DIAGNOSIS — I5022 Chronic systolic (congestive) heart failure: Secondary | ICD-10-CM | POA: Diagnosis not present

## 2016-12-31 DIAGNOSIS — I5023 Acute on chronic systolic (congestive) heart failure: Secondary | ICD-10-CM | POA: Diagnosis not present

## 2016-12-31 LAB — CBC AND DIFFERENTIAL
HCT: 38 % (ref 36–46)
HEMOGLOBIN: 12.8 g/dL (ref 12.0–16.0)
PLATELETS: 96 10*3/uL — AB (ref 150–399)
WBC: 4.3 10*3/mL

## 2016-12-31 LAB — BASIC METABOLIC PANEL
BUN: 24 mg/dL — AB (ref 4–21)
Creatinine: 1 mg/dL (ref 0.5–1.1)
GLUCOSE: 74 mg/dL
POTASSIUM: 4.5 mmol/L (ref 3.4–5.3)
Sodium: 136 mmol/L — AB (ref 137–147)

## 2016-12-31 LAB — POCT INR: INR: 3.9

## 2016-12-31 MED ORDER — DOXYCYCLINE HYCLATE 100 MG PO TABS: 100.0000 mg | ORAL_TABLET | Freq: Two times a day (BID) | ORAL | 0 refills | Status: DC

## 2016-12-31 NOTE — Patient Outreach (Signed)
[facility] Care Management  12/31/2016  Misty Gray Oct 26, 1951 [id number]   Weekly transition of care call placed to member, no answer.  HIPAA compliant voice message left.  Will await call back.  If no call back will follow up next week.  Kemper Durie, BSN, [facility] Care Management  [facility] Care Manager [phone]

## 2016-12-31 NOTE — Telephone Encounter (Signed)
5:15 pm -  Home health RN calling from [facility]  Patient has had a blister on left leg and was ordered to get an unna boot/ace wrap per Dr. Gala Romney.  Rechecked leg and now looks like may be developing cellulitis.  Dressed blister with absorbent sponge and gauze.  RN was told by [name] to contact patient's PCP to possibly start an abx.  Spoke with Dr. Leveda Anna and he will send in abx to patient's pharmacy.
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
## 2016-12-31 NOTE — Telephone Encounter (Signed)
[facility] nurse called with concern.  Has leg blister, draining.  She applied unaboot last week per dr. Milas Kocher.  Today, seems warm and red.  She is concerned about cellulitis.  We are closed tomorrow.  Will treat empirically based on her clinical observation.

## 2017-01-01 DIAGNOSIS — K219 Gastro-esophageal reflux disease without esophagitis: Secondary | ICD-10-CM | POA: Diagnosis not present

## 2017-01-01 DIAGNOSIS — I110 Hypertensive heart disease with heart failure: Secondary | ICD-10-CM | POA: Diagnosis not present

## 2017-01-01 DIAGNOSIS — D696 Thrombocytopenia, unspecified: Secondary | ICD-10-CM | POA: Diagnosis not present

## 2017-01-01 DIAGNOSIS — G40909 Epilepsy, unspecified, not intractable, without status epilepticus: Secondary | ICD-10-CM | POA: Diagnosis not present

## 2017-01-01 DIAGNOSIS — I872 Venous insufficiency (chronic) (peripheral): Secondary | ICD-10-CM | POA: Diagnosis not present

## 2017-01-01 DIAGNOSIS — E669 Obesity, unspecified: Secondary | ICD-10-CM | POA: Diagnosis not present

## 2017-01-01 DIAGNOSIS — I482 Chronic atrial fibrillation: Secondary | ICD-10-CM | POA: Diagnosis not present

## 2017-01-01 DIAGNOSIS — G4733 Obstructive sleep apnea (adult) (pediatric): Secondary | ICD-10-CM | POA: Diagnosis not present

## 2017-01-01 DIAGNOSIS — I5023 Acute on chronic systolic (congestive) heart failure: Secondary | ICD-10-CM | POA: Diagnosis not present

## 2017-01-02 ENCOUNTER — Ambulatory Visit: Payer: Medicare Other | Admitting: Family Medicine

## 2017-01-03 ENCOUNTER — Telehealth (HOSPITAL_COMMUNITY): Payer: Self-pay | Admitting: *Deleted

## 2017-01-03 DIAGNOSIS — E669 Obesity, unspecified: Secondary | ICD-10-CM | POA: Diagnosis not present

## 2017-01-03 DIAGNOSIS — K219 Gastro-esophageal reflux disease without esophagitis: Secondary | ICD-10-CM | POA: Diagnosis not present

## 2017-01-03 DIAGNOSIS — D696 Thrombocytopenia, unspecified: Secondary | ICD-10-CM | POA: Diagnosis not present

## 2017-01-03 DIAGNOSIS — G4733 Obstructive sleep apnea (adult) (pediatric): Secondary | ICD-10-CM | POA: Diagnosis not present

## 2017-01-03 DIAGNOSIS — I5023 Acute on chronic systolic (congestive) heart failure: Secondary | ICD-10-CM | POA: Diagnosis not present

## 2017-01-03 DIAGNOSIS — I482 Chronic atrial fibrillation: Secondary | ICD-10-CM | POA: Diagnosis not present

## 2017-01-03 DIAGNOSIS — I872 Venous insufficiency (chronic) (peripheral): Secondary | ICD-10-CM | POA: Diagnosis not present

## 2017-01-03 DIAGNOSIS — G40909 Epilepsy, unspecified, not intractable, without status epilepticus: Secondary | ICD-10-CM | POA: Diagnosis not present

## 2017-01-03 DIAGNOSIS — I110 Hypertensive heart disease with heart failure: Secondary | ICD-10-CM | POA: Diagnosis not present

## 2017-01-03 NOTE — Telephone Encounter (Signed)
Diane, OT with [facility] called for verbal orders to continue OT with patient for 2 more weeks.  Verbal order given.

## 2017-01-03 NOTE — Telephone Encounter (Signed)
Amy with [facility] called and reported patient's abdominal girth has increased and leg swelling has increased.  Scale at patient's house has been inaccurate but patient complains of feeling tightness in abdomin.  BNP was 769 which has decreased from her last appointment with [place]. Patient is now being treated with doxycycline for cellulitis in lower left leg. Amy has not been able to wrap legs yet due to redness and swelling but said she will try and wrap them on her next visit.  She instructed patient to keep legs elevated as much as possible for now.    Amy is having patient take an extra Torsemide today and tomorrow and will report back to [place] if no changes occur.  Patient has an appointment with [place] already next Thursday.

## 2017-01-06 ENCOUNTER — Other Ambulatory Visit: Payer: Self-pay | Admitting: *Deleted

## 2017-01-06 DIAGNOSIS — G40909 Epilepsy, unspecified, not intractable, without status epilepticus: Secondary | ICD-10-CM | POA: Diagnosis not present

## 2017-01-06 DIAGNOSIS — I5023 Acute on chronic systolic (congestive) heart failure: Secondary | ICD-10-CM | POA: Diagnosis not present

## 2017-01-06 DIAGNOSIS — E669 Obesity, unspecified: Secondary | ICD-10-CM | POA: Diagnosis not present

## 2017-01-06 DIAGNOSIS — D696 Thrombocytopenia, unspecified: Secondary | ICD-10-CM | POA: Diagnosis not present

## 2017-01-06 DIAGNOSIS — K219 Gastro-esophageal reflux disease without esophagitis: Secondary | ICD-10-CM | POA: Diagnosis not present

## 2017-01-06 DIAGNOSIS — I482 Chronic atrial fibrillation: Secondary | ICD-10-CM | POA: Diagnosis not present

## 2017-01-06 DIAGNOSIS — I110 Hypertensive heart disease with heart failure: Secondary | ICD-10-CM | POA: Diagnosis not present

## 2017-01-06 DIAGNOSIS — I872 Venous insufficiency (chronic) (peripheral): Secondary | ICD-10-CM | POA: Diagnosis not present

## 2017-01-06 DIAGNOSIS — G4733 Obstructive sleep apnea (adult) (pediatric): Secondary | ICD-10-CM | POA: Diagnosis not present

## 2017-01-06 LAB — POCT INR: INR: 2.9

## 2017-01-06 NOTE — Patient Outreach (Signed)
[facility]) Care Management  01/06/2017  Misty Gray 1951-05-29 [id number]   Notified by care management assistant that member triggered red on EMMI heart failure dashboard on 1/20 for new or worsening problems.  Call placed to member, she state that she is "doing alright."  She state that she denied any new problems during EMMI call, does not understand how she triggered red.  She denies any new concerns today, stating "I got the same problems I always have, legs swollen, stomach swollen, legs wrapped up."  She report that she continue to have visits from home health nurse, state that she still has swelling in her legs.  She expresses frustration regarding low sodium diet.  She verbalizes understanding and state that she now read nutrition labels, but state "ain't nothing I can eat."  She is reminded of appropriate foods, she verbalizes understanding and importance of decreasing use of processed/canned foods but continue to voice frustration regarding limitations.  She state that she is compliant with her medications and daily weights.  She report that she has been taking her doxycycline as instructed, have 1 more day left.  She again expresses concern regarding accuracy of scale.  She state that the scale was provided through [employer].  Advised to contact them to request new scale.    Member reminded of MD appointments this week, GI tomorrow, primary MD and heart failure clinic Thursday.  She repeat herself several times, and confuses dates and times although appointments were handwritten for member during last home visit.  Clarification provided, she sate that she wrote the information again to provide to son.    Transition of care program complete, will continue to follow for heart failure management.  Will follow up next month with home visit.  Kemper Durie, [place], MSN [facility] Care Management  [facility] Manager [phone]

## 2017-01-07 ENCOUNTER — Encounter: Payer: Self-pay | Admitting: Internal Medicine

## 2017-01-07 ENCOUNTER — Telehealth: Payer: Self-pay | Admitting: Family Medicine

## 2017-01-07 ENCOUNTER — Ambulatory Visit (INDEPENDENT_AMBULATORY_CARE_PROVIDER_SITE_OTHER): Payer: Medicare HMO | Admitting: Internal Medicine

## 2017-01-07 VITALS — BP 98/58 | HR 60 | Ht 62.0 in | Wt 200.4 lb

## 2017-01-07 DIAGNOSIS — D696 Thrombocytopenia, unspecified: Secondary | ICD-10-CM | POA: Diagnosis not present

## 2017-01-07 DIAGNOSIS — I482 Chronic atrial fibrillation: Secondary | ICD-10-CM | POA: Diagnosis not present

## 2017-01-07 DIAGNOSIS — R195 Other fecal abnormalities: Secondary | ICD-10-CM | POA: Diagnosis not present

## 2017-01-07 DIAGNOSIS — I5023 Acute on chronic systolic (congestive) heart failure: Secondary | ICD-10-CM | POA: Diagnosis not present

## 2017-01-07 DIAGNOSIS — G4733 Obstructive sleep apnea (adult) (pediatric): Secondary | ICD-10-CM | POA: Diagnosis not present

## 2017-01-07 DIAGNOSIS — E669 Obesity, unspecified: Secondary | ICD-10-CM | POA: Diagnosis not present

## 2017-01-07 DIAGNOSIS — I110 Hypertensive heart disease with heart failure: Secondary | ICD-10-CM | POA: Diagnosis not present

## 2017-01-07 DIAGNOSIS — K219 Gastro-esophageal reflux disease without esophagitis: Secondary | ICD-10-CM | POA: Diagnosis not present

## 2017-01-07 DIAGNOSIS — I872 Venous insufficiency (chronic) (peripheral): Secondary | ICD-10-CM | POA: Diagnosis not present

## 2017-01-07 DIAGNOSIS — G40909 Epilepsy, unspecified, not intractable, without status epilepticus: Secondary | ICD-10-CM | POA: Diagnosis not present

## 2017-01-07 NOTE — Progress Notes (Signed)
   Misty Gray 66 y.o. 1951-09-21 [id number]  Assessment & Plan:   Encounter Diagnosis  Name Primary?  . Heme + stool/+cologuard Yes    She has severe CHF and was a marginal candidate for colonoscopy before these tests and is not a good candidate now. Plus, she does not want a colonoscopy.   Will see her prn - CHF and co-morbidities preclude colonoscopy. She understands and accepts.  YE:LYHTMB, Ronnell Freshwater, MD     Subjective:   Chief Complaint: heme + and Cologuard +  HPI Elderly woman with CHF - was seen by PCP earlier in year and had screening hemoccult - + stool - saw [place] and declined a colonoscopy and ended up having a + cologuard test. She is not seeing visible blood. She does not want a colonoscopy.Two admits CHF exacerbations lat Oct and Dec 2017.   Medications, allergies, past medical history, past surgical history, family history and social history are reviewed and updated in the EMR.   Review of Systems As above chronic DOE/rest  Objective:   Physical Exam BP (!) 98/58   Pulse 60   Ht 5\' 2"  (1.575 m)   Wt 200 lb 6.4 oz (90.9 kg)   SpO2 100%   BMI 36.65 kg/m  Chronically ill and NAD

## 2017-01-07 NOTE — Telephone Encounter (Signed)
Pt with [facility]:  Verbal orders needed for one week one, two week one, one week one.

## 2017-01-07 NOTE — Patient Instructions (Signed)
   Follow up as needed.       I appreciate the opportunity to care for you. Stan Head, MD, [facility]

## 2017-01-08 ENCOUNTER — Other Ambulatory Visit: Payer: Self-pay | Admitting: *Deleted

## 2017-01-08 ENCOUNTER — Ambulatory Visit: Payer: Self-pay | Admitting: *Deleted

## 2017-01-08 LAB — POCT INR: INR: 2.1

## 2017-01-09 ENCOUNTER — Ambulatory Visit: Payer: Self-pay | Admitting: Family Medicine

## 2017-01-09 ENCOUNTER — Ambulatory Visit (HOSPITAL_COMMUNITY)
Admission: RE | Admit: 2017-01-09 | Discharge: 2017-01-09 | Disposition: A | Discharge status: Home / Self Care | Payer: Medicare HMO | Source: Ambulatory Visit | Attending: Cardiology | Admitting: Cardiology

## 2017-01-09 VITALS — BP 92/68 | HR 90 | Wt 202.1 lb

## 2017-01-09 DIAGNOSIS — I5023 Acute on chronic systolic (congestive) heart failure: Secondary | ICD-10-CM | POA: Diagnosis not present

## 2017-01-09 DIAGNOSIS — Z8673 Personal history of transient ischemic attack (TIA), and cerebral infarction without residual deficits: Secondary | ICD-10-CM | POA: Diagnosis not present

## 2017-01-09 DIAGNOSIS — Z880 Allergy status to penicillin: Secondary | ICD-10-CM | POA: Insufficient documentation

## 2017-01-09 DIAGNOSIS — I493 Ventricular premature depolarization: Secondary | ICD-10-CM | POA: Diagnosis not present

## 2017-01-09 DIAGNOSIS — Z5189 Encounter for other specified aftercare: Secondary | ICD-10-CM | POA: Diagnosis not present

## 2017-01-09 DIAGNOSIS — I519 Heart disease, unspecified: Secondary | ICD-10-CM | POA: Diagnosis not present

## 2017-01-09 DIAGNOSIS — D696 Thrombocytopenia, unspecified: Secondary | ICD-10-CM | POA: Diagnosis not present

## 2017-01-09 DIAGNOSIS — I5022 Chronic systolic (congestive) heart failure: Secondary | ICD-10-CM | POA: Insufficient documentation

## 2017-01-09 DIAGNOSIS — I878 Other specified disorders of veins: Secondary | ICD-10-CM | POA: Diagnosis not present

## 2017-01-09 DIAGNOSIS — Z79899 Other long term (current) drug therapy: Secondary | ICD-10-CM | POA: Insufficient documentation

## 2017-01-09 DIAGNOSIS — G40909 Epilepsy, unspecified, not intractable, without status epilepticus: Secondary | ICD-10-CM | POA: Diagnosis not present

## 2017-01-09 DIAGNOSIS — G4733 Obstructive sleep apnea (adult) (pediatric): Secondary | ICD-10-CM | POA: Diagnosis not present

## 2017-01-09 DIAGNOSIS — I959 Hypotension, unspecified: Secondary | ICD-10-CM | POA: Insufficient documentation

## 2017-01-09 DIAGNOSIS — K219 Gastro-esophageal reflux disease without esophagitis: Secondary | ICD-10-CM | POA: Diagnosis not present

## 2017-01-09 DIAGNOSIS — I429 Cardiomyopathy, unspecified: Secondary | ICD-10-CM | POA: Diagnosis not present

## 2017-01-09 DIAGNOSIS — I482 Chronic atrial fibrillation, unspecified: Secondary | ICD-10-CM

## 2017-01-09 DIAGNOSIS — Z87891 Personal history of nicotine dependence: Secondary | ICD-10-CM | POA: Diagnosis not present

## 2017-01-09 DIAGNOSIS — I872 Venous insufficiency (chronic) (peripheral): Secondary | ICD-10-CM | POA: Diagnosis not present

## 2017-01-09 DIAGNOSIS — Z950 Presence of cardiac pacemaker: Secondary | ICD-10-CM | POA: Diagnosis not present

## 2017-01-09 DIAGNOSIS — E669 Obesity, unspecified: Secondary | ICD-10-CM | POA: Diagnosis not present

## 2017-01-09 DIAGNOSIS — I110 Hypertensive heart disease with heart failure: Secondary | ICD-10-CM | POA: Diagnosis not present

## 2017-01-09 LAB — BASIC METABOLIC PANEL
ANION GAP: 9 (ref 5–15)
BUN: 24 mg/dL — ABNORMAL HIGH (ref 6–20)
CO2: 26 mmol/L (ref 22–32)
Calcium: 9.4 mg/dL (ref 8.9–10.3)
Chloride: 102 mmol/L (ref 101–111)
Creatinine, Ser: 0.97 mg/dL (ref 0.44–1.00)
GFR calc Af Amer: >60 mL/min (ref >60)
GFR calc non Af Amer: 59 mL/min — ABNORMAL LOW (ref >60)
GLUCOSE: 87 mg/dL (ref 65–99)
POTASSIUM: 3.9 mmol/L (ref 3.5–5.1)
Sodium: 137 mmol/L (ref 135–145)

## 2017-01-09 LAB — BRAIN NATRIURETIC PEPTIDE: B Natriuretic Peptide: 1066.8 pg/mL — ABNORMAL HIGH (ref 0.0–100.0)

## 2017-01-09 NOTE — Progress Notes (Signed)
CSW met with patient and family in the clinic. Patient's son states he found an affordable house although not available until 2/12/20/16. Son reports that the landlord seemed nice and son would be able to do some upgrades to the home and landlord will reduce monthly rent. CSW offered assistance if needed although son denies help at this time. CSW will continue to follow as needed. Raquel Sarna, [place], [place]

## 2017-01-09 NOTE — Patient Instructions (Signed)
Routine lab work today. Will notify you of abnormal results, otherwise no news is good news!  Follow up 2 weeks with Otilio Saber PA-C.  Follow up 1 month with Dr. Gala Romney and echocardiogram.  Do the following things EVERYDAY: 1) Weigh yourself in the morning before breakfast. Write it down and keep it in a log. 2) Take your medicines as prescribed 3) Eat low salt foods-Limit salt (sodium) to 2000 mg per day.  4) Stay as active as you can everyday 5) Limit all fluids for the day to less than 2 liters

## 2017-01-09 NOTE — Progress Notes (Signed)
ADVANCED HF CLINIC NOTE Primary HF: Dr. Gala Romney   HPI: 66 y/o woman with history of morbid obesity, OSA, chronic atrial fibrillation with tachy-brady syndrome s/p PPM, frequent PVCs, systolic HF due to NICM.  Recently admitted for ADHF in 10/17. Cath with normal coronary arteries and elevated filling pressures. Echo 10/17 EF 30-35% (previously 35-40%). 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 (She has 81% RV pacing and 15-20 PVCs per minute). Started on po amio with suppression of PVCs. Diuresed 41 pounds. Discharge weight 197.   Admitted 12/14 through 12/06/2016 with marked volume overload. Diuresed with IV lasix + metolazone. Discharge weight was 195 pounds.Transitioned to torsemide 40 mg in am and 20 mg in pm.   She returns today for follow up.  Weight up 5 lbs since last visit. She has been taking torsemide 20 mg daily with extra 20 mg in evening as needed. She is not sure how often she is doing this.. Feels like unna boots haven't helped. Breathing has been about the same. Uses a wood stove for heat at home that seems to exacerbate her dyspnea. Not SOB walking around the house.  PT still coming out to the house. Hasn't been weighing at home. Missed medications yesterday. Denies lightheadedness or dizziness. Admits to dietary non-compliance -> hot dog.   R/LHC 10/03/16 Ao = 94/58 (72) LV = 104/20 RA = 21 RV = 65/4/19 PA = 65/21 (38) PCW = 23 Fick cardiac output/index = 6.1/2.9 PVR = 2.5 WU FA sat = 97%  PA sat = 69%, 70%  Assessment: 1. Normal coronary arteries 2. NICM with EF 30-35% by echo 3. Persistently elevated biventricular pressures    Past Medical History:  Diagnosis Date  . Abnormal CT of the head 12/16/1984   R parietal atrophy  . Allergic rhinitis, cause unspecified   . Anemia   . Arthritis    "hands and knees" (09/06/2015)  . Atrial fibrillation (HCC)   . Cellulitis 09/07/2015  . Cellulitis and abscess of leg 09/2016   bilateral  . CHF (congestive heart  failure) (HCC)   . Diaphragmatic hernia without mention of obstruction or gangrene   . Dyspnea   . Exertional dyspnea 06/22/12  . Family history of adverse reaction to anesthesia    "daughter fights w/them; just can't relax during OR; stay awake during the OR"  . Female stress incontinence   . GERD (gastroesophageal reflux disease)   . Grand mal seizure (HCC)   . H/O hiatal hernia    two removed  . Heart murmur   . High cholesterol   . History of blood transfusion 1956   "related to nose bleed"  . Hypertension   . Influenza A 01/11/2014  . Lichenification and lichen simplex chronicus   . Migraine    "when I was a teenager"  . Morbid obesity (HCC)   . Nonischemic cardiomyopathy (HCC)    EF has normalized-repeat Pending   . On home oxygen therapy    "2L when I'm asleep in bed" (09/06/2015)  . OSA on CPAP   . Pacemaker  st judes    Patient states "this is my third pacemaker"  . Pneumonia 2004; 2011; 11/2011  . Seizures (HCC) since 1981   "can hear you talking but sounds like you are in big tunnel; have them often if not taking RX; last one was 06/17/12" (09/06/2015)  . Shortness of breath 10/24/2010   Qualifier: Diagnosis of  By: [place], Bonnie    . Sick sinus syndrome (HCC)  WITH PRIOR DDD PACEMAKER IMPLANTATION  . Stroke [facility]) 1988   "mouth drawed real bad on my left side; found out it was a seizure"  . Syncope and collapse 06/22/12   "hit forehead and left knee"; denies loss of consciousness  . Unspecified venous (peripheral) insufficiency     Current Outpatient Prescriptions  Medication Sig Dispense Refill  . amiodarone (PACERONE) 200 MG tablet Take 1 tablet (200 mg total) by mouth 2 (two) times daily. 60 tablet 5  . cholecalciferol (VITAMIN D) 1000 units tablet Take 1,000 Units by mouth daily.    . fluticasone (FLONASE) 50 MCG/ACT nasal spray Place 2 sprays into both nostrils daily as needed. For congestion. 16 g 1  . omeprazole (PRILOSEC) 20 MG capsule Take 1 capsule (20 mg  total) by mouth daily. 90 capsule 1  . OXYGEN Inhale 2 L into the lungs at bedtime. cpap machine    . PHENobarbital (LUMINAL) 32.4 MG tablet Take 2 tablets (64.8 mg total) by mouth 2 (two) times daily. 120 tablet 5  . potassium chloride SA (K-DUR,KLOR-CON) 20 MEQ tablet Take 2 tablets (40 mEq total) by mouth daily. 30 tablet 2  . torsemide (DEMADEX) 20 MG tablet Take 20 mg by mouth daily.    Marland Kitchen warfarin (COUMADIN) 5 MG tablet 2 tablets (10 mg) on Monday and Thursday;  1 tablet (5 mg) all other days 100 tablet 1   No current facility-administered medications for this encounter.     Allergies  Allergen Reactions  . Aspirin Hives and Rash  . Penicillins Rash and Other (See Comments)    Amoxicillin also, Has patient had a PCN reaction causing immediate rash, facial/tongue/throat swelling, SOB or lightheadedness with hypotension: Yes Has patient had a PCN reaction causing severe rash involving mucus membranes or skin necrosis: No Has patient had a PCN reaction that required hospitalization No Has patient had a PCN reaction occurring within the last 10 years: Yes If all of the above answers are "NO", then may proceed with Cephalosporin use.   . Tape Other (See Comments)    NO PAPER TAPE-MUST BE MEDICAL TAPE  . Wool Alcohol [Lanolin] Hives  . Amoxicillin Rash  . Ampicillin Itching and Rash      Social History   Social History  . Marital status: Married    Spouse name: [name]  . Number of children: 4  . Years of education: 12   Occupational History  . disabled    Social History Main Topics  . Smoking status: Former Smoker    Packs/day: 1.00    Years: 7.00    Types: Cigarettes    Quit date: 03/16/1969  . Smokeless tobacco: Never Used  . Alcohol use No  . Drug use: No  . Sexual activity: Not Currently    Birth control/ protection: Post-menopausal   Other Topics Concern  . Not on file   Social History Narrative   Lives with husband [name] lives with  them as do his 2 children   Daughter, [name], and her 3 children live in [place] -  [name] is incarcerated   Nephew, Dannetta Lekas lives with them   Daughter, [name], [name] [address]   Moved June [address] to a better home on [facility].      Lives with husband, son, grandson, granddaughter, nephew.      Family History  Problem Relation Age of Onset  . Stroke Father  died from CAD?  Marland Kitchen Heart disease Father   . Cancer Mother   . Diabetes Son     Type 1  . Sleep apnea Son   . Cancer Sister     cervical  . Hypertension Sister   . Hypertension Brother   . Rheum arthritis Daughter     Also PGM, PGGM    Vitals:   01/09/17 1402  BP: 92/68  Pulse: 90  SpO2: 95%  Weight: 202 lb 2 oz (91.7 kg)   Wt Readings from Last 3 Encounters:  01/09/17 202 lb 2 oz (91.7 kg)  01/07/17 200 lb 6.4 oz (90.9 kg)  12/26/16 197 lb (89.4 kg)   PHYSICAL EXAM:  General: Walked into clinic without difficult. Son, husband, and granddaughter present.  HEENT: Normal Neck: supple. JVP 8-9 cm. Carotids 2+ bilat; no bruits. No thyromegaly or nodule noted.  Cor: PMI nonpalpable Somewhat irregular. 2/6 TR Lungs: Mildly diminished basilar sounds.   Abdomen: Obese, + abdominal hernia. Soft, NT, mild distention, no HSM. No bruits or masses. +BS  Extremities: no cyanosis, clubbing, rash, Unna boots in place, but not very tight.  1+ edema above unna boots.  Neuro: alert & orientedx3, cranial nerves grossly intact. moves all 4 extremities w/o difficulty. Affect pleasant  ASSESSMENT & PLAN: 1. Chronic systolic HF due to NICM. ? PVC induced. Echo 12/06/2016 EF 10-15%. Cath 10/17 normal coronary arteries.   - Volume status mildly up in setting of dietary and med non-compliance. Will have her take extra torsemide for next 2 days.  - Repeat echo in 1 month to see if EF has recovered with PVC suppression. If remains < 35% need to consider CRT-D with chronic pacing.  -  Continue torsemide 20 mg daily. With extra 20 mg in evening as needed for weight gain.  Will have her take evening dose next 2 days as above. - Continue potassium 40 meq daily. BMET/BNP today.  2. Frequent PVCs - Continue amiodarone 200 mg BID 3. Obesity  - Continue to work with PT. Needs to limit salty foods and increase activity as tolerated. 4. Hypotension - Stable to soft. No room to up-titrate meds. 5. Chronic AF with tach brady s/p pacemaker - Continue coumadin. Could consider switch to DOAC in future.  - Denies overt bleeding on coumadin, but recently had positive Cologuard. Per GI not candidate for Colonoscopy.   6. Chronic venous stasis - Continue unna boots. Stressed compliance.  Has been making family loosen and then re-apply them.   Continue paramedicine. Take extra torsemide next 2 nights. Encouraged med and diet compliance.    Follow up 2 weeks PA/NP side. Follow up 1 month MD side with Echo.     Graciella Freer, PA-C  2:33 PM   Total time spent > 25 minutes. Over half that spent discussing the above.

## 2017-01-09 NOTE — Telephone Encounter (Signed)
RN staff - please provide verbal orders

## 2017-01-09 NOTE — Telephone Encounter (Signed)
Verbal orders provided. ?

## 2017-01-10 DIAGNOSIS — I110 Hypertensive heart disease with heart failure: Secondary | ICD-10-CM | POA: Diagnosis not present

## 2017-01-10 DIAGNOSIS — K219 Gastro-esophageal reflux disease without esophagitis: Secondary | ICD-10-CM | POA: Diagnosis not present

## 2017-01-10 DIAGNOSIS — D696 Thrombocytopenia, unspecified: Secondary | ICD-10-CM | POA: Diagnosis not present

## 2017-01-10 DIAGNOSIS — I482 Chronic atrial fibrillation: Secondary | ICD-10-CM | POA: Diagnosis not present

## 2017-01-10 DIAGNOSIS — I5023 Acute on chronic systolic (congestive) heart failure: Secondary | ICD-10-CM | POA: Diagnosis not present

## 2017-01-10 DIAGNOSIS — G40909 Epilepsy, unspecified, not intractable, without status epilepticus: Secondary | ICD-10-CM | POA: Diagnosis not present

## 2017-01-10 DIAGNOSIS — E669 Obesity, unspecified: Secondary | ICD-10-CM | POA: Diagnosis not present

## 2017-01-10 DIAGNOSIS — G4733 Obstructive sleep apnea (adult) (pediatric): Secondary | ICD-10-CM | POA: Diagnosis not present

## 2017-01-10 DIAGNOSIS — I872 Venous insufficiency (chronic) (peripheral): Secondary | ICD-10-CM | POA: Diagnosis not present

## 2017-01-11 ENCOUNTER — Other Ambulatory Visit: Payer: Self-pay | Admitting: Family Medicine

## 2017-01-13 ENCOUNTER — Telehealth: Payer: Self-pay | Admitting: *Deleted

## 2017-01-13 ENCOUNTER — Other Ambulatory Visit: Payer: Self-pay | Admitting: *Deleted

## 2017-01-13 ENCOUNTER — Encounter: Payer: Self-pay | Admitting: Family Medicine

## 2017-01-13 DIAGNOSIS — I482 Chronic atrial fibrillation: Secondary | ICD-10-CM | POA: Diagnosis not present

## 2017-01-13 DIAGNOSIS — I5023 Acute on chronic systolic (congestive) heart failure: Secondary | ICD-10-CM | POA: Diagnosis not present

## 2017-01-13 DIAGNOSIS — E669 Obesity, unspecified: Secondary | ICD-10-CM | POA: Diagnosis not present

## 2017-01-13 DIAGNOSIS — K219 Gastro-esophageal reflux disease without esophagitis: Secondary | ICD-10-CM | POA: Diagnosis not present

## 2017-01-13 DIAGNOSIS — I110 Hypertensive heart disease with heart failure: Secondary | ICD-10-CM | POA: Diagnosis not present

## 2017-01-13 DIAGNOSIS — G40909 Epilepsy, unspecified, not intractable, without status epilepticus: Secondary | ICD-10-CM | POA: Diagnosis not present

## 2017-01-13 DIAGNOSIS — D696 Thrombocytopenia, unspecified: Secondary | ICD-10-CM | POA: Diagnosis not present

## 2017-01-13 DIAGNOSIS — I872 Venous insufficiency (chronic) (peripheral): Secondary | ICD-10-CM | POA: Diagnosis not present

## 2017-01-13 DIAGNOSIS — G4733 Obstructive sleep apnea (adult) (pediatric): Secondary | ICD-10-CM | POA: Diagnosis not present

## 2017-01-13 LAB — POCT INR: INR: 5.7

## 2017-01-13 NOTE — Telephone Encounter (Signed)
Prior Authorization received from [employer] for Phenobarbital. Formulary and PA form placed in provider box for completion. Clovis Pu, RN

## 2017-01-13 NOTE — Progress Notes (Signed)
Results from 01/01/16 entered in results console (ordered by Dr. Kittie Plater).  BNP on this date was 64

## 2017-01-14 ENCOUNTER — Other Ambulatory Visit: Payer: Self-pay | Admitting: *Deleted

## 2017-01-14 ENCOUNTER — Telehealth (HOSPITAL_COMMUNITY): Payer: Self-pay | Admitting: *Deleted

## 2017-01-14 MED ORDER — TORSEMIDE 20 MG PO TABS: 20.0000 mg | ORAL_TABLET | Freq: Every day | ORAL | 0 refills | Status: DC

## 2017-01-14 MED ORDER — METOLAZONE 2.5 MG PO TABS: 2.5000 mg | ORAL_TABLET | Freq: Once | ORAL | 0 refills | Status: DC

## 2017-01-14 MED ORDER — POTASSIUM CHLORIDE CRYS ER 10 MEQ PO TBCR: 40.0000 meq | EXTENDED_RELEASE_TABLET | Freq: Every day | ORAL | 0 refills | Status: DC

## 2017-01-14 MED ORDER — POTASSIUM CHLORIDE CRYS ER 10 MEQ PO TBCR: 40.0000 meq | EXTENDED_RELEASE_TABLET | Freq: Every day | ORAL | 3 refills | Status: DC

## 2017-01-14 NOTE — Patient Outreach (Signed)
[facility]) Care Management  01/14/2017  Misty Gray 12-Apr-1951 [id number]   Call placed to member to follow up on office visits last week with heart failure clinic and GI specialist, no answer.  HIPAA compliant voice message left.  Will await call back, if no call back, will follow up within the next 2 weeks.  Kemper Durie, [place], MSN [facility] Care Management  [facility] Manager [phone]

## 2017-01-14 NOTE — Telephone Encounter (Signed)
Completed and returned to [employer] RN.

## 2017-01-14 NOTE — Telephone Encounter (Signed)
PA form faxed to [employer] for review.  Review process could take 24-72 hours to complete.  Martin, Tamika L, RN  

## 2017-01-14 NOTE — Telephone Encounter (Signed)
Amy with [facility] called reporting patient to continually have increased swelling, abdominal garth increased to 112.5 from 106.9, and appears to have gained weight but unable to obtain accurate weights due to living conditions.  Talked with Tonye Becket, NP regarding symptoms and she advises to do the following:  -Take 2.5 mg of Metolazone (1 Tablet) Once  -Take Torsemide 40 mg (2 Tablets) BID for two days only, then resume taking 20 mg (1 Tablet) once daily.  -Take Potassium 40 mEq BID for two days with torsemide then resume 40 mEq Once Daily  I called Amy with [facility] back and told her the plan, she is calling patient now to discuss the plan of care.  She will go check on patient tomorrow and [name] with [facility] will go see patient on Thursday. Im also having patient to come in for an office visit next week. Medication changes and refills sent to pharmacy.

## 2017-01-15 ENCOUNTER — Other Ambulatory Visit (HOSPITAL_COMMUNITY): Payer: Self-pay | Admitting: Internal Medicine

## 2017-01-15 DIAGNOSIS — K219 Gastro-esophageal reflux disease without esophagitis: Secondary | ICD-10-CM | POA: Diagnosis not present

## 2017-01-15 DIAGNOSIS — D696 Thrombocytopenia, unspecified: Secondary | ICD-10-CM | POA: Diagnosis not present

## 2017-01-15 DIAGNOSIS — I872 Venous insufficiency (chronic) (peripheral): Secondary | ICD-10-CM | POA: Diagnosis not present

## 2017-01-15 DIAGNOSIS — E669 Obesity, unspecified: Secondary | ICD-10-CM | POA: Diagnosis not present

## 2017-01-15 DIAGNOSIS — I110 Hypertensive heart disease with heart failure: Secondary | ICD-10-CM | POA: Diagnosis not present

## 2017-01-15 DIAGNOSIS — I482 Chronic atrial fibrillation: Secondary | ICD-10-CM | POA: Diagnosis not present

## 2017-01-15 DIAGNOSIS — I5023 Acute on chronic systolic (congestive) heart failure: Secondary | ICD-10-CM | POA: Diagnosis not present

## 2017-01-15 DIAGNOSIS — G40909 Epilepsy, unspecified, not intractable, without status epilepticus: Secondary | ICD-10-CM | POA: Diagnosis not present

## 2017-01-15 DIAGNOSIS — G4733 Obstructive sleep apnea (adult) (pediatric): Secondary | ICD-10-CM | POA: Diagnosis not present

## 2017-01-16 DIAGNOSIS — I110 Hypertensive heart disease with heart failure: Secondary | ICD-10-CM | POA: Diagnosis not present

## 2017-01-16 DIAGNOSIS — G4733 Obstructive sleep apnea (adult) (pediatric): Secondary | ICD-10-CM | POA: Diagnosis not present

## 2017-01-16 DIAGNOSIS — I872 Venous insufficiency (chronic) (peripheral): Secondary | ICD-10-CM | POA: Diagnosis not present

## 2017-01-16 DIAGNOSIS — K219 Gastro-esophageal reflux disease without esophagitis: Secondary | ICD-10-CM | POA: Diagnosis not present

## 2017-01-16 DIAGNOSIS — G40909 Epilepsy, unspecified, not intractable, without status epilepticus: Secondary | ICD-10-CM | POA: Diagnosis not present

## 2017-01-16 DIAGNOSIS — D696 Thrombocytopenia, unspecified: Secondary | ICD-10-CM | POA: Diagnosis not present

## 2017-01-16 DIAGNOSIS — E669 Obesity, unspecified: Secondary | ICD-10-CM | POA: Diagnosis not present

## 2017-01-16 DIAGNOSIS — I5023 Acute on chronic systolic (congestive) heart failure: Secondary | ICD-10-CM | POA: Diagnosis not present

## 2017-01-16 DIAGNOSIS — I482 Chronic atrial fibrillation: Secondary | ICD-10-CM | POA: Diagnosis not present

## 2017-01-17 NOTE — Telephone Encounter (Signed)
PA for phenobarbital 32.4 mg tablet was approved via [employer] until 12/15/17.  Clovis Pu, RN

## 2017-01-21 ENCOUNTER — Other Ambulatory Visit: Payer: Self-pay | Admitting: *Deleted

## 2017-01-21 NOTE — Progress Notes (Signed)
   Subjective:    Patient ID: Misty Gray, female    DOB: 05-15-1951, 66 y.o.   MRN: [id number]  HPI 66 y/o female presents for routine follow up. Last seen in office on 12/12/16 for hospital follow up of Acute on Chronic Systolic CHF.  Systolic CHF  Currently prescribed Torsemide 20 mg daily and Amiodarone 200 mg BID, unable to tolerate ACE-I/BB due to hypotension. Rare lightheadedness, no chest pain.   Hypokalemia  Cardiology managing, increased to the Kdur to 40 mEq twice daily  Chronic Venous Stasis Unna boots placed by cardiology on 1/11/8. Treated empirically for cellulitis on 12/31/16 (see phone note by Dr. Leveda Anna). Not currently wearing Unna boots. Swelling is well controlled.   Allergies/runny nose Wood heater at home, constant runny nose, uses flonase daily  Atrial Fibrillation On coumadin, INR elevated, today, no current bleeding  HM Due for colonoscopy (seen by GI on 01/07/17 and deemed a poor candidate for colonoscopy at this time). Positive Cologuard in 10/2016.   Social  Currently living with family members, looking at a couple of different house options  Review of Systems See above    Objective:   Physical Exam BP 110/80   Pulse 66   Temp 98.2 F (36.8 C) (Oral)   Ht 5\' 2"  (1.575 m)   Wt 188 lb (85.3 kg)   SpO2 94%   BMI 34.39 kg/m  Sp02 92% on RA  Gen: pleasant female, NAD, somewhat somnolent Cardiac: Irregular Irregular rate and rhythm, no murmur Resp: faint crackles at bases, clear otherwise, normal work of breathing Ext: 2+ edema bilaterally, no skin breakdown, slight erythema without warmth  INR 5.1     Assessment & Plan:  Chronic systolic dysfunction of left ventricle Stable -continue Torsemide 20 mg daily -encouraged patient to check weights at home   Chronic anticoagulation INR elevated to 5.1 today. No active bleeding -hold coumadin for 3 days, restart at 5 mg daily, recheck INR in one week  Chronic atrial fibrillation (HCC) Rate  controlled on Amiodarone.   Chronic venous stasis dermatitis Stable, Edema at baseline. No cellulitis. -encouraged elevated and use of compression stockings as tolerated  Allergic rhinitis Uncontrolled. Suspect due to home environment (wood stove). -continue Flonase -As plans to move soon recommended follow up if symptoms persist away from wood stove.   Somnolence Patient mildly somnolent today however Oriented X3 and conversing. Suspect related to multiple medical issues.  -will schedule close follow up -provided return precautions to son and patient.

## 2017-01-21 NOTE — Patient Outreach (Signed)
[place] [facility]) Care Management   01/21/2017  Misty Gray 01-27-1951 [id number]  Misty Gray is an 66 y.o. female  Subjective:   Member report that she is "as good as I can be."  She denies pain or discomfort at this time, report intermittent shortness of breath but state that it is her baseline.  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  She state that she has one more visit with PT and will be discharged.  She has not been weighing herself daily, still does not have accurate scale. She report that she has been following her instructed diet, state she did have a hot dog "once".  She is able to verbalize proper low sodium foods.  She has a pack of Ramen noodles at bedside, denies eating them, states "Oh no.  That's not on my diet.  My family eat that stuff."  Objective:   Review of Systems  Constitutional: Negative.   HENT: Negative.   Eyes: Negative.   Respiratory: Negative.   Cardiovascular: Positive for leg swelling.  Gastrointestinal: Negative.   Genitourinary: Negative.   Musculoskeletal: Negative.   Skin: Negative.   Neurological: Negative.   Endo/Heme/Allergies: Negative.   Psychiatric/Behavioral: Negative.     Physical Exam  Constitutional: She is oriented to person, place, and time. She appears well-developed and well-nourished.  Neck: Normal range of motion.  Cardiovascular: Normal rate, regular rhythm and normal heart sounds.   Respiratory: Effort normal and breath sounds normal.  GI: Soft. Bowel sounds are normal. She exhibits distension.  Musculoskeletal: Normal range of motion.  Neurological: She is alert and oriented to person, place, and time.  Skin: Skin is warm and dry.   BP (!) 83/64   Pulse 68   Resp 20   SpO2 98%   Encounter Medications:   Outpatient Encounter Prescriptions as of 01/21/2017  Medication Sig  . amiodarone (PACERONE) 200 MG tablet Take 1 tablet (200 mg total) by mouth  2 (two) times daily.  . cholecalciferol (VITAMIN D) 1000 units tablet Take 1,000 Units by mouth daily.  . fluticasone (FLONASE) 50 MCG/ACT nasal spray Place 2 sprays into both nostrils daily as needed. For congestion.  Marland Kitchen omeprazole (PRILOSEC) 20 MG capsule TAKE ONE CAPSULE BY MOUTH EVERY DAY  . OXYGEN Inhale 2 L into the lungs at bedtime. cpap machine  . PHENobarbital (LUMINAL) 32.4 MG tablet Take 2 tablets (64.8 mg total) by mouth 2 (two) times daily.  . potassium chloride (K-DUR,KLOR-CON) 10 MEQ tablet Take 4 tablets (40 mEq total) by mouth daily.  Marland Kitchen torsemide (DEMADEX) 20 MG tablet Take 1 tablet (20 mg total) by mouth daily. Take 40 mg (2 Tablets) Two times daily for two days only. Then take 20 mg (1 Tablet) Once Daily.  Marland Kitchen warfarin (COUMADIN) 5 MG tablet 2 tablets (10 mg) on Monday and Thursday;  1 tablet (5 mg) all other days  . metolazone (ZAROXOLYN) 2.5 MG tablet Take 1 tablet (2.5 mg total) by mouth once.   No facility-administered encounter medications on file as of 01/21/2017.     Functional Status:   In your present state of health, do you have any difficulty performing the following activities: 11/28/2016 10/14/2016  Hearing? N N  Vision? N N  Difficulty concentrating or making decisions? N N  Walking or climbing stairs? Y Y  Dressing or bathing? Y N  Doing errands, shopping? Tempie Donning  Preparing Food and eating ? -  N  Using the Toilet? - N  In the past six months, have you accidently leaked urine? - Y  Do you have problems with loss of bowel control? - N  Managing your Medications? - N  Managing your Finances? - N  Housekeeping or managing your Housekeeping? - N  Some recent data might be hidden    Fall/Depression Screening:    PHQ 2/9 Scores 12/12/2016 11/28/2016 11/21/2016 11/14/2016 10/17/2016 10/14/2016 09/26/2016  PHQ - 2 Score 0 0 0 0 0 0 0    Assessment:    Met with member at scheduled time.  She remains at her daughter's home, state that she and her son has not had  any luck finding a home that is suitable for them to live yet.  She state that they will continue to look.  She state that she is unable to contact [employer] regarding scale exchange because she no longer has their insurance.  She report that she has switched to [facility].  [facility] administrative assistant notified of need for scale, requested one be ordered and delivered to home.  She report compliance with medications, pill box filled.  Member is aware of how to weigh self, aware that she will need to contact MD if she gain more than 3 pounds in a day or 5 pounds in a week.  Legs remain swollen, better than last home visit.  She has follow up with primary MD office and heart failure clinic this week.  She denies any urgent concerns.  Advised to contact with questions.  Plan:   Will follow up next month with routine home visit.  [facility] CM Care Plan Problem One   Flowsheet Row Most Recent Value  Care Plan Problem One  Knowledge deficit related to management of heart failure as evidenced by inadequate follow through of instructions  Role Documenting the Problem One  Care Management [place] for Problem One  Active  [facility] Long Term Goal (31-90 days)  Member will be able to verbalize yellow zone of heart failure action plan within the next 31 days  [facility] Long Term Goal Start Date  01/21/17 [Goal not met, date reset]  Interventions for Problem One Long Term Goal  Re-educated member on heart failure zones and when to notify physician.  Member was already provided with [facility] calendar tool with heart failure zones included.  [facility] CM Short Term Goal #1 (0-30 days)  Member will have new scale within the next 2 weeks  [facility] CM Short Term Goal #1 Start Date  01/21/17 [Goal not met, date reset]  Interventions for Short Term Goal #1  Request made to order new scale, provided by [facility], will be delivered to [facility] home.  [facility] CM Short Term Goal #2 (0-30 days)  Member will weigh self daily and record readings  over the next 4 weeks  [facility] CM Short Term Goal #2 Start Date  01/21/17  Interventions for Short Term Goal #2  Re-educated on importance of daily weights in effort to manage heart failure.  Member made aware that new scale ordered.    [facility] CM Care Plan Problem Three   Flowsheet Row Most Recent Value  Care Plan Problem Three  Risk for hospital readmit related to heart failure as evidenced by recent hospitalization  Role Documenting the Problem Three  Care Management [place] for Problem Three  Not Active  [facility]N Long Term Goal (31-90) days  Member will not be readmitted to hospital wihtin the  next 31 days of discharge  [place] Term Goal Start Date  12/10/16  [facility] Long Term Goal Met Date  01/06/17  Interventions for Problem Three Long Term Goal  Discussed with member the importance of following discharge instructions, including follow up appointments, medications, diet, and home health involvement, to decrease the risk of readmission  [facility] CM Short Term Goal #1 (0-30 days)  Member will keep and attend both primary MD and cardiology appointments within the next 2 weeks  [facility] CM Short Term Goal #1 Start Date  12/10/16  [facility] CM Short Term Goal #1 Met Date  12/20/16  Interventions for Short Term Goal #1  Member advised of the importance of attending follow up appointments to decrease risk of readmission.  Provided  member with date & times of both appointments (12/28 & 1/4)  [facility] CM Short Term Goal #2 (0-30 days)  Member will weigh and record readings daily over the next 4 weeks  [facility] CM Short Term Goal #2 Start Date  01/06/17 [Date reset]  Interventions for Short Term Goal #2  Member re-educated on the importance of daily weights and when to contact MD     Valente David, RN, MSN [place] Manager [phone]

## 2017-01-22 ENCOUNTER — Ambulatory Visit (HOSPITAL_COMMUNITY)
Admission: RE | Admit: 2017-01-22 | Discharge: 2017-01-22 | Disposition: A | Discharge status: Home / Self Care | Payer: Medicare HMO | Source: Ambulatory Visit | Attending: Cardiology | Admitting: Cardiology

## 2017-01-22 ENCOUNTER — Telehealth (HOSPITAL_COMMUNITY): Payer: Self-pay | Admitting: Cardiology

## 2017-01-22 VITALS — BP 84/62 | HR 72 | Wt 186.8 lb

## 2017-01-22 DIAGNOSIS — I519 Heart disease, unspecified: Secondary | ICD-10-CM

## 2017-01-22 DIAGNOSIS — I5022 Chronic systolic (congestive) heart failure: Secondary | ICD-10-CM | POA: Diagnosis not present

## 2017-01-22 DIAGNOSIS — I493 Ventricular premature depolarization: Secondary | ICD-10-CM | POA: Diagnosis not present

## 2017-01-22 DIAGNOSIS — Z79899 Other long term (current) drug therapy: Secondary | ICD-10-CM | POA: Diagnosis not present

## 2017-01-22 DIAGNOSIS — I872 Venous insufficiency (chronic) (peripheral): Secondary | ICD-10-CM | POA: Diagnosis not present

## 2017-01-22 DIAGNOSIS — Z87891 Personal history of nicotine dependence: Secondary | ICD-10-CM | POA: Insufficient documentation

## 2017-01-22 DIAGNOSIS — G40909 Epilepsy, unspecified, not intractable, without status epilepticus: Secondary | ICD-10-CM | POA: Diagnosis not present

## 2017-01-22 DIAGNOSIS — Z880 Allergy status to penicillin: Secondary | ICD-10-CM | POA: Diagnosis not present

## 2017-01-22 DIAGNOSIS — I878 Other specified disorders of veins: Secondary | ICD-10-CM | POA: Diagnosis not present

## 2017-01-22 DIAGNOSIS — G4733 Obstructive sleep apnea (adult) (pediatric): Secondary | ICD-10-CM | POA: Diagnosis not present

## 2017-01-22 DIAGNOSIS — K219 Gastro-esophageal reflux disease without esophagitis: Secondary | ICD-10-CM | POA: Diagnosis not present

## 2017-01-22 DIAGNOSIS — I429 Cardiomyopathy, unspecified: Secondary | ICD-10-CM | POA: Diagnosis not present

## 2017-01-22 DIAGNOSIS — Z950 Presence of cardiac pacemaker: Secondary | ICD-10-CM | POA: Insufficient documentation

## 2017-01-22 DIAGNOSIS — Z5189 Encounter for other specified aftercare: Secondary | ICD-10-CM | POA: Diagnosis not present

## 2017-01-22 DIAGNOSIS — I959 Hypotension, unspecified: Secondary | ICD-10-CM | POA: Insufficient documentation

## 2017-01-22 DIAGNOSIS — I482 Chronic atrial fibrillation, unspecified: Secondary | ICD-10-CM

## 2017-01-22 DIAGNOSIS — Z7901 Long term (current) use of anticoagulants: Secondary | ICD-10-CM

## 2017-01-22 DIAGNOSIS — D696 Thrombocytopenia, unspecified: Secondary | ICD-10-CM | POA: Diagnosis not present

## 2017-01-22 DIAGNOSIS — I110 Hypertensive heart disease with heart failure: Secondary | ICD-10-CM | POA: Insufficient documentation

## 2017-01-22 DIAGNOSIS — I5023 Acute on chronic systolic (congestive) heart failure: Secondary | ICD-10-CM | POA: Diagnosis not present

## 2017-01-22 DIAGNOSIS — E669 Obesity, unspecified: Secondary | ICD-10-CM | POA: Diagnosis not present

## 2017-01-22 DIAGNOSIS — I509 Heart failure, unspecified: Secondary | ICD-10-CM

## 2017-01-22 LAB — BASIC METABOLIC PANEL
Anion gap: 11 (ref 5–15)
BUN: 28 mg/dL — ABNORMAL HIGH (ref 6–20)
CHLORIDE: 95 mmol/L — AB (ref 101–111)
CO2: 32 mmol/L (ref 22–32)
Calcium: 9.6 mg/dL (ref 8.9–10.3)
Creatinine, Ser: 1.19 mg/dL — ABNORMAL HIGH (ref 0.44–1.00)
GFR calc Af Amer: 54 mL/min — ABNORMAL LOW (ref >60)
GFR calc non Af Amer: 46 mL/min — ABNORMAL LOW (ref >60)
Glucose, Bld: 110 mg/dL — ABNORMAL HIGH (ref 65–99)
POTASSIUM: 2.7 mmol/L — AB (ref 3.5–5.1)
SODIUM: 138 mmol/L (ref 135–145)

## 2017-01-22 LAB — MAGNESIUM: MAGNESIUM: 2 mg/dL (ref 1.7–2.4)

## 2017-01-22 NOTE — Telephone Encounter (Signed)
k 2.7   Notes Recorded by Graciella Freer, PA-C on 01/22/2017 at 3:41 PM EST Should take potassium 40 meq BID today and tomorrow, and needs repeat BMET Friday am. Should also take 40 meq Friday before she comes.   Please stress importance of getting repeat labs with risk of arrhythmia.   Casimiro Needle [address]" Piedra Aguza, PA-C 01/22/2017 3:38 PM   Patient aware. Via SOn at [phone] Voiced understanding and will make sure mom gets medications Will have repeat labs 2/9 @430  as this is the only time he can bring her.

## 2017-01-22 NOTE — Progress Notes (Signed)
ADVANCED HF CLINIC NOTE Primary HF: Dr. Gala Romney   HPI: 66 y/o woman with history of morbid obesity, OSA, chronic atrial fibrillation with tachy-brady syndrome s/p PPM, frequent PVCs, systolic HF due to NICM.  Recently admitted for ADHF in 10/17. Cath with normal coronary arteries and elevated filling pressures. Echo 10/17 EF 30-35% (previously 35-40%). 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 (She has 81% RV pacing and 15-20 PVCs per minute). Started on po amio with suppression of PVCs. Diuresed 41 pounds. Discharge weight 197.   Admitted 12/14 through 12/06/2016 with marked volume overload. Diuresed with IV lasix + metolazone. Discharge weight was 195 pounds.Transitioned to torsemide 40 mg in am and 20 mg in pm.   She returns today for regular follow up.  Weight down 16 lbs. Has been taking torsemide 20 mg daily after metolazone and extra torsemide last week. Finished HHPT in the morning. Has been feeling better with weight off.     Weight up 5 lbs since last visit. She has been taking torsemide 20 mg daily with extra 20 mg in evening as needed. She is not sure how often she is doing this.. Feels like unna boots haven't helped. Breathing has been about the same. Uses a wood stove for heat at home that seems to exacerbate her dyspnea. Not SOB walking around the house.  PT still coming out to the house. Hasn't been weighing at home. Missed medications yesterday. Denies lightheadedness or dizziness. Admits to dietary non-compliance -> hot dog.   R/LHC 10/03/16 Ao = 94/58 (72) LV = 104/20 RA = 21 RV = 65/4/19 PA = 65/21 (38) PCW = 23 Fick cardiac output/index = 6.1/2.9 PVR = 2.5 WU FA sat = 97%  PA sat = 69%, 70%  Assessment: 1. Normal coronary arteries 2. NICM with EF 30-35% by echo 3. Persistently elevated biventricular pressures    Past Medical History:  Diagnosis Date  . Abnormal CT of the head 12/16/1984   R parietal atrophy  . Allergic rhinitis, cause unspecified   .  Anemia   . Arthritis    "hands and knees" (09/06/2015)  . Atrial fibrillation (HCC)   . Cellulitis 09/07/2015  . Cellulitis and abscess of leg 09/2016   bilateral  . CHF (congestive heart failure) (HCC)   . Diaphragmatic hernia without mention of obstruction or gangrene   . Dyspnea   . Exertional dyspnea 06/22/12  . Family history of adverse reaction to anesthesia    "daughter fights w/them; just can't relax during OR; stay awake during the OR"  . Female stress incontinence   . GERD (gastroesophageal reflux disease)   . Grand mal seizure (HCC)   . H/O hiatal hernia    two removed  . Heart murmur   . High cholesterol   . History of blood transfusion 1956   "related to nose bleed"  . Hypertension   . Influenza A 01/11/2014  . Lichenification and lichen simplex chronicus   . Migraine    "when I was a teenager"  . Morbid obesity (HCC)   . Nonischemic cardiomyopathy (HCC)    EF has normalized-repeat Pending   . On home oxygen therapy    "2L when I'm asleep in bed" (09/06/2015)  . OSA on CPAP   . Pacemaker  st judes    Patient states "this is my third pacemaker"  . Pneumonia 2004; 2011; 11/2011  . Seizures (HCC) since 1981   "can hear you talking but sounds like you are in big tunnel; have  them often if not taking RX; last one was 06/17/12" (09/06/2015)  . Shortness of breath 10/24/2010   Qualifier: Diagnosis of  By: [place], Bonnie    . Sick sinus syndrome (HCC)    WITH PRIOR DDD PACEMAKER IMPLANTATION  . Stroke [facility]) 1988   "mouth drawed real bad on my left side; found out it was a seizure"  . Syncope and collapse 06/22/12   "hit forehead and left knee"; denies loss of consciousness  . Unspecified venous (peripheral) insufficiency     Current Outpatient Prescriptions  Medication Sig Dispense Refill  . amiodarone (PACERONE) 200 MG tablet Take 1 tablet (200 mg total) by mouth 2 (two) times daily. 60 tablet 5  . cholecalciferol (VITAMIN D) 1000 units tablet Take 1,000 Units by mouth  daily.    . fluticasone (FLONASE) 50 MCG/ACT nasal spray Place 2 sprays into both nostrils daily as needed. For congestion. 16 g 1  . metolazone (ZAROXOLYN) 2.5 MG tablet Take 2.5 mg by mouth as needed (weight gain or shortness of breath).    Marland Kitchen omeprazole (PRILOSEC) 20 MG capsule TAKE ONE CAPSULE BY MOUTH EVERY DAY 90 capsule 1  . OXYGEN Inhale 2 L into the lungs at bedtime. cpap machine    . PHENobarbital (LUMINAL) 32.4 MG tablet Take 2 tablets (64.8 mg total) by mouth 2 (two) times daily. 120 tablet 5  . potassium chloride (K-DUR,KLOR-CON) 10 MEQ tablet Take 4 tablets (40 mEq total) by mouth daily. 130 tablet 3  . torsemide (DEMADEX) 20 MG tablet Take 20 mg by mouth daily.    Marland Kitchen warfarin (COUMADIN) 5 MG tablet 2 tablets (10 mg) on Monday and Thursday;  1 tablet (5 mg) all other days 100 tablet 1   No current facility-administered medications for this encounter.     Allergies  Allergen Reactions  . Aspirin Hives and Rash  . Penicillins Rash and Other (See Comments)    Amoxicillin also, Has patient had a PCN reaction causing immediate rash, facial/tongue/throat swelling, SOB or lightheadedness with hypotension: Yes Has patient had a PCN reaction causing severe rash involving mucus membranes or skin necrosis: No Has patient had a PCN reaction that required hospitalization No Has patient had a PCN reaction occurring within the last 10 years: Yes If all of the above answers are "NO", then may proceed with Cephalosporin use.   . Tape Other (See Comments)    NO PAPER TAPE-MUST BE MEDICAL TAPE  . Wool Alcohol [Lanolin] Hives  . Amoxicillin Rash  . Ampicillin Itching and Rash      Social History   Social History  . Marital status: Married    Spouse name: [name]  . Number of children: 4  . Years of education: 12   Occupational History  . disabled    Social History Main Topics  . Smoking status: Former Smoker    Packs/day: 1.00    Years: 7.00    Types: Cigarettes    Quit date:  03/16/1969  . Smokeless tobacco: Never Used  . Alcohol use No  . Drug use: No  . Sexual activity: Not Currently    Birth control/ protection: Post-menopausal   Other Topics Concern  . Not on file   Social History Narrative   Lives with husband [name] lives with them as do his 2 children   Daughter, [name], and her 3 children live in [place] -  [name] is incarcerated   [place], Rubie Ficco lives with  them   Daughter, [name], in [name]   Moved June 2013 to a better home on [address]      Lives with husband, son, grandson, granddaughter, nephew.      Family History  Problem Relation Age of Onset  . Stroke Father     died from CAD?  Marland Kitchen Heart disease Father   . Cancer Mother   . Diabetes Son     Type 1  . Sleep apnea Son   . Cancer Sister     cervical  . Hypertension Sister   . Hypertension Brother   . Rheum arthritis Daughter     Also PGM, PGGM    Vitals:   01/22/17 1406  BP: (!) 84/62  Pulse: 72  SpO2: 94%  Weight: 186 lb 12.8 oz (84.7 kg)   Wt Readings from Last 3 Encounters:  01/22/17 186 lb 12.8 oz (84.7 kg)  01/09/17 202 lb 2 oz (91.7 kg)  01/07/17 200 lb 6.4 oz (90.9 kg)   PHYSICAL EXAM:  General: Son and granddaugher present. Walked into clinic without difficulty.   HEENT: Normal.  Neck: supple. JVP 6-7 cm. Carotids 2+ bilat; no bruits. No thyromegaly or nodule noted.  Cor: PMI nonpalpable Irregular. 2/6 TR Lungs: Clear, normal effort.    Abdomen: Obese, + abdominal hernia. Soft, NT, ND, no HSM. +BS  Extremities: no cyanosis, clubbing, rash, Chronic 1-2+ edema.   Neuro: alert & orientedx3, cranial nerves grossly intact. moves all 4 extremities w/o difficulty. Affect pleasant  ASSESSMENT & PLAN: 1. Chronic systolic HF due to NICM. ? PVC induced. Echo 12/06/2016 EF 10-15%. Cath 10/17 normal coronary arteries.   - Volume status much improved after extra diuretics last week.  -  Continue torsemide 20 mg daily for now.  Extra 20 mg as needed and metolazone as directed by HF clinic.   - Continue potassium 40 meq daily for now. BMET today.  - Repeat echo in 1 month to see if EF has recovered with PVC suppression. If remains < 35% need to consider CRT-D with chronic pacing.  - Reinforced fluid restriction to < 2 L daily, sodium restriction to less than 2000 mg daily, and the importance of daily weights.   2. Frequent PVCs - Continue amiodarone 200 mg BID for now.  If EF improved may be able to decrease, of if progress to CRT-D may be able to stop completely.  3. Obesity  - Continue to work with PT. Needs to limit salty foods and increase activity as tolerated. 4. Hypotension - Too soft for any up-titration of meds.  5. Chronic AF with tach brady s/p pacemaker - Continue coumadin. Could consider switch to DOAC in future.  - Denies overt bleeding on coumadin, but recently had positive Cologuard. Per GI not candidate for Colonoscopy.   - No change to current plan.  6. Chronic venous stasis - Unna boots off today. Stressed importance of compliance.    Continue paramedicine. Meds today.  RTC 1 month with Echo. If EF remains depressed need to consider upgrade to CRT-D.     Again stressed importance of diet and med compliance.       Graciella Freer, PA-C  2:16 PM

## 2017-01-22 NOTE — Patient Instructions (Signed)
Labs today We will only contact you if something comes back abnormal or we need to make some changes. Otherwise no news is good news!  Your physician has requested that you have an echocardiogram. Echocardiography is a painless test that uses sound waves to create images of your heart. It provides your doctor with information about the size and shape of your heart and how well your heart's chambers and valves are working. This procedure takes approximately one hour. There are no restrictions for this procedure.  Your physician recommends that you schedule a follow-up appointment in: 1 month with Dr Gala Romney  Do the following things EVERYDAY: 1) Weigh yourself in the morning before breakfast. Write it down and keep it in a log. 2) Take your medicines as prescribed 3) Eat low salt foods-Limit salt (sodium) to 2000 mg per day.  4) Stay as active as you can everyday 5) Limit all fluids for the day to less than 2 liters

## 2017-01-22 NOTE — Progress Notes (Signed)
Advanced Heart Failure Medication Review by a Pharmacist  Does the patient  feel that his/her medications are working for him/her?  yes  Has the patient been experiencing any side effects to the medications prescribed?  no  Does the patient measure his/her own blood pressure or blood glucose at home?  yes   Does the patient have any problems obtaining medications due to transportation or finances?   no  Understanding of regimen: good Understanding of indications: good Potential of compliance: good Patient understands to avoid NSAIDs. Patient understands to avoid decongestants.  Issues to address at subsequent visits: None   Pharmacist comments: Ms. Sherin is a pleasant 66 yo F presenting with her son and granddaughter. She reports good compliance with her regimen and did not have any specific medication-related questions or concerns for me at this time.   [name]. Bonnye Fava, PharmD, BCPS, CPP Clinical Pharmacist Pager: [phone] Phone: [phone] 01/22/2017 2:15 PM      Time with patient: 10 minutes Preparation and documentation time: 2 minutes Total time: 12 minutes

## 2017-01-23 ENCOUNTER — Ambulatory Visit (INDEPENDENT_AMBULATORY_CARE_PROVIDER_SITE_OTHER): Payer: Medicare HMO | Admitting: Family Medicine

## 2017-01-23 DIAGNOSIS — I519 Heart disease, unspecified: Secondary | ICD-10-CM | POA: Diagnosis not present

## 2017-01-23 DIAGNOSIS — Z7901 Long term (current) use of anticoagulants: Secondary | ICD-10-CM

## 2017-01-23 DIAGNOSIS — I482 Chronic atrial fibrillation, unspecified: Secondary | ICD-10-CM

## 2017-01-23 DIAGNOSIS — R400 Somnolence: Secondary | ICD-10-CM

## 2017-01-23 DIAGNOSIS — J309 Allergic rhinitis, unspecified: Secondary | ICD-10-CM

## 2017-01-23 DIAGNOSIS — I872 Venous insufficiency (chronic) (peripheral): Secondary | ICD-10-CM

## 2017-01-23 LAB — POCT INR: INR: 5.1

## 2017-01-23 NOTE — Patient Instructions (Addendum)
It was nice to see you today.   Heart Disease - continue Torsemide daily   Allergies - continue Flonase  Coumadin - hold for 3 days then restart at 5 mg daily

## 2017-01-24 ENCOUNTER — Ambulatory Visit (HOSPITAL_COMMUNITY)
Admission: RE | Admit: 2017-01-24 | Discharge: 2017-01-24 | Disposition: A | Discharge status: Home / Self Care | Payer: Medicare HMO | Source: Ambulatory Visit | Attending: Cardiology | Admitting: Cardiology

## 2017-01-24 ENCOUNTER — Encounter: Payer: Self-pay | Admitting: Family Medicine

## 2017-01-24 DIAGNOSIS — I509 Heart failure, unspecified: Secondary | ICD-10-CM | POA: Insufficient documentation

## 2017-01-24 DIAGNOSIS — D696 Thrombocytopenia, unspecified: Secondary | ICD-10-CM | POA: Diagnosis not present

## 2017-01-24 DIAGNOSIS — I872 Venous insufficiency (chronic) (peripheral): Secondary | ICD-10-CM | POA: Diagnosis not present

## 2017-01-24 DIAGNOSIS — I110 Hypertensive heart disease with heart failure: Secondary | ICD-10-CM | POA: Diagnosis not present

## 2017-01-24 DIAGNOSIS — E669 Obesity, unspecified: Secondary | ICD-10-CM | POA: Diagnosis not present

## 2017-01-24 DIAGNOSIS — K219 Gastro-esophageal reflux disease without esophagitis: Secondary | ICD-10-CM | POA: Diagnosis not present

## 2017-01-24 DIAGNOSIS — I482 Chronic atrial fibrillation: Secondary | ICD-10-CM | POA: Diagnosis not present

## 2017-01-24 DIAGNOSIS — G4733 Obstructive sleep apnea (adult) (pediatric): Secondary | ICD-10-CM | POA: Diagnosis not present

## 2017-01-24 DIAGNOSIS — I5023 Acute on chronic systolic (congestive) heart failure: Secondary | ICD-10-CM | POA: Diagnosis not present

## 2017-01-24 DIAGNOSIS — G40909 Epilepsy, unspecified, not intractable, without status epilepticus: Secondary | ICD-10-CM | POA: Diagnosis not present

## 2017-01-24 LAB — BASIC METABOLIC PANEL
Anion gap: 8 (ref 5–15)
BUN: 30 mg/dL — ABNORMAL HIGH (ref 6–20)
CALCIUM: 9.5 mg/dL (ref 8.9–10.3)
CO2: 32 mmol/L (ref 22–32)
CREATININE: 1.14 mg/dL — AB (ref 0.44–1.00)
Chloride: 95 mmol/L — ABNORMAL LOW (ref 101–111)
GFR, EST AFRICAN AMERICAN: 56 mL/min — AB (ref >60)
GFR, EST NON AFRICAN AMERICAN: 49 mL/min — AB (ref >60)
Glucose, Bld: 141 mg/dL — ABNORMAL HIGH (ref 65–99)
Potassium: 3.6 mmol/L (ref 3.5–5.1)
SODIUM: 135 mmol/L (ref 135–145)

## 2017-01-24 NOTE — Assessment & Plan Note (Signed)
INR elevated to 5.1 today. No active bleeding -hold coumadin for 3 days, restart at 5 mg daily, recheck INR in one week

## 2017-01-24 NOTE — Assessment & Plan Note (Signed)
Rate controlled on Amiodarone.

## 2017-01-24 NOTE — Assessment & Plan Note (Signed)
Patient mildly somnolent today however Oriented X3 and conversing. Suspect related to multiple medical issues.  -will schedule close follow up -provided return precautions to son and patient.

## 2017-01-24 NOTE — Assessment & Plan Note (Signed)
Stable, Edema at baseline. No cellulitis. -encouraged elevated and use of compression stockings as tolerated

## 2017-01-24 NOTE — Assessment & Plan Note (Signed)
Uncontrolled. Suspect due to home environment (wood stove). -continue Flonase -As plans to move soon recommended follow up if symptoms persist away from wood stove.

## 2017-01-24 NOTE — Assessment & Plan Note (Signed)
Stable -continue Torsemide 20 mg daily -encouraged patient to check weights at home

## 2017-01-29 ENCOUNTER — Telehealth: Payer: Self-pay | Admitting: *Deleted

## 2017-01-29 ENCOUNTER — Telehealth (HOSPITAL_COMMUNITY): Payer: Self-pay | Admitting: *Deleted

## 2017-01-29 DIAGNOSIS — G40909 Epilepsy, unspecified, not intractable, without status epilepticus: Secondary | ICD-10-CM | POA: Diagnosis not present

## 2017-01-29 DIAGNOSIS — I110 Hypertensive heart disease with heart failure: Secondary | ICD-10-CM | POA: Diagnosis not present

## 2017-01-29 DIAGNOSIS — D696 Thrombocytopenia, unspecified: Secondary | ICD-10-CM | POA: Diagnosis not present

## 2017-01-29 DIAGNOSIS — I872 Venous insufficiency (chronic) (peripheral): Secondary | ICD-10-CM | POA: Diagnosis not present

## 2017-01-29 DIAGNOSIS — I5023 Acute on chronic systolic (congestive) heart failure: Secondary | ICD-10-CM | POA: Diagnosis not present

## 2017-01-29 DIAGNOSIS — E669 Obesity, unspecified: Secondary | ICD-10-CM | POA: Diagnosis not present

## 2017-01-29 DIAGNOSIS — G4733 Obstructive sleep apnea (adult) (pediatric): Secondary | ICD-10-CM | POA: Diagnosis not present

## 2017-01-29 DIAGNOSIS — K219 Gastro-esophageal reflux disease without esophagitis: Secondary | ICD-10-CM | POA: Diagnosis not present

## 2017-01-29 DIAGNOSIS — I482 Chronic atrial fibrillation: Secondary | ICD-10-CM | POA: Diagnosis not present

## 2017-01-29 MED ORDER — POTASSIUM CHLORIDE CRYS ER 10 MEQ PO TBCR: 40.0000 meq | EXTENDED_RELEASE_TABLET | Freq: Two times a day (BID) | ORAL | 3 refills | Status: DC

## 2017-01-29 MED ORDER — TORSEMIDE 20 MG PO TABS: 40.0000 mg | ORAL_TABLET | Freq: Every day | ORAL | Status: DC

## 2017-01-29 NOTE — Telephone Encounter (Signed)
RN note reviewed

## 2017-01-29 NOTE — Telephone Encounter (Signed)
Misty Gray nurse from [facility] called in to report pt INR, which was 1.7. Pt hasn't missed any doses, no bleeding, or trips to the ED. Per dr Deirdre Priest have pt continue 5mg  daily and recheck in 1 week. Adair Lemar Bruna Potter, CMA

## 2017-01-29 NOTE — Telephone Encounter (Signed)
Aimee, RN called concerned about pt.  She states prior to pt's appt last week pt had been taking Torsemide 40 mg pretty much every day and she feels the 20 mg is not enough to keep the fluid off.  She states pt's scales are not accurate, but pt's legs are more swollen this week.  She also questions what dose of KCL pt should be on.  She states pt did increase to 40 BID last week as instructed and since labs on Fri she has been taking 40 daily.  Discussed all above w/Andy Tillery, PA, he recommends pt increase Torsemide to 40 mg daily, KCL to 40 meq BID with repeat bmet in 1 week.    Aimee, RN is aware and will adjust pill box for pt, she will get bmet next week, she ask that new rx for KCL be sent in for pt, rx sent.

## 2017-02-03 ENCOUNTER — Other Ambulatory Visit: Payer: Self-pay | Admitting: *Deleted

## 2017-02-03 DIAGNOSIS — G4733 Obstructive sleep apnea (adult) (pediatric): Secondary | ICD-10-CM | POA: Diagnosis not present

## 2017-02-03 DIAGNOSIS — G40909 Epilepsy, unspecified, not intractable, without status epilepticus: Secondary | ICD-10-CM | POA: Diagnosis not present

## 2017-02-03 DIAGNOSIS — E786 Lipoprotein deficiency: Secondary | ICD-10-CM | POA: Diagnosis not present

## 2017-02-03 DIAGNOSIS — K219 Gastro-esophageal reflux disease without esophagitis: Secondary | ICD-10-CM | POA: Diagnosis not present

## 2017-02-03 DIAGNOSIS — D696 Thrombocytopenia, unspecified: Secondary | ICD-10-CM | POA: Diagnosis not present

## 2017-02-03 DIAGNOSIS — I482 Chronic atrial fibrillation: Secondary | ICD-10-CM | POA: Diagnosis not present

## 2017-02-03 DIAGNOSIS — I5023 Acute on chronic systolic (congestive) heart failure: Secondary | ICD-10-CM | POA: Diagnosis not present

## 2017-02-03 DIAGNOSIS — I872 Venous insufficiency (chronic) (peripheral): Secondary | ICD-10-CM | POA: Diagnosis not present

## 2017-02-03 DIAGNOSIS — I110 Hypertensive heart disease with heart failure: Secondary | ICD-10-CM | POA: Diagnosis not present

## 2017-02-03 DIAGNOSIS — E669 Obesity, unspecified: Secondary | ICD-10-CM | POA: Diagnosis not present

## 2017-02-03 LAB — BASIC METABOLIC PANEL
BUN: 28 mg/dL — AB (ref 4–21)
CREATININE: 1.1 mg/dL (ref 0.5–1.1)
Glucose: 115 mg/dL
Potassium: 3.8 mmol/L (ref 3.4–5.3)
Sodium: 133 mmol/L — AB (ref 137–147)

## 2017-02-03 LAB — POCT INR: INR: 1.7

## 2017-02-05 ENCOUNTER — Encounter: Payer: Self-pay | Admitting: Family Medicine

## 2017-02-05 NOTE — Progress Notes (Signed)
Entered results from [facility]. Ordered by Cardiology.

## 2017-02-07 ENCOUNTER — Encounter: Payer: Self-pay | Admitting: Family Medicine

## 2017-02-07 ENCOUNTER — Ambulatory Visit (INDEPENDENT_AMBULATORY_CARE_PROVIDER_SITE_OTHER): Payer: Medicare HMO | Admitting: Family Medicine

## 2017-02-07 DIAGNOSIS — I519 Heart disease, unspecified: Secondary | ICD-10-CM | POA: Diagnosis not present

## 2017-02-07 DIAGNOSIS — I872 Venous insufficiency (chronic) (peripheral): Secondary | ICD-10-CM | POA: Diagnosis not present

## 2017-02-07 DIAGNOSIS — I10 Essential (primary) hypertension: Secondary | ICD-10-CM | POA: Diagnosis not present

## 2017-02-07 NOTE — Assessment & Plan Note (Signed)
Increased weight and swelling.  -increase Torsemide to 40 mg BID (currently taking daily) -return in one week for follow up

## 2017-02-07 NOTE — Assessment & Plan Note (Signed)
Increased swelling in legs, now with bulla from edema. No evidence of infection.  -bilateral Unna boots applied -return in one week

## 2017-02-07 NOTE — Patient Instructions (Addendum)
It was nice to see you   Unna boots applied today - return in one week for   Increased fluid - take Lasix 40 mg twice daily (2 tablets in the AM and 2 tablets in the PM) for 3 days then return to 40 mg daily  Return in one week for follow up.

## 2017-02-07 NOTE — Progress Notes (Signed)
   Subjective:    Patient ID: Misty Gray, female    DOB: October 19, 1951, 66 y.o.   MRN: [id number]  HPI  66 y/o female presents for routine follow up.   Systolic CHF Taking Torsemide 40 mg daily (recently increased by cardiology), reports breathing is stable, no chest pain, uses oxygen at night with CPAP  Venous Insufficiency New blister on left, keeping legs elevated, increased swelling in legs, no fevers.   Hypertension Off BP meds due to hypotension, no lightheadedness today  Social  Moves into new house in one week  Review of Systems See above    Objective:   Physical Exam BP 100/70   Pulse 79   Temp 97.9 F (36.6 C) (Oral)   Ht 5\' 2"  (1.575 m)   Wt 191 lb (86.6 kg)   BMI 34.93 kg/m  Weight up 5 pounds from last visit.   Gen: pleasant female, NAD Cardiac: Irregular Irregular rhythm, S1 and S2 present, no murmur Resp: crackles in bilateral bases, no increased work of breathing Ext: 2+ edema of bilateral, bulla of left inner leg, mild erythema, no warmth     Assessment & Plan:  Chronic systolic dysfunction of left ventricle Increased weight and swelling.  -increase Torsemide to 40 mg BID (currently taking daily) -return in one week for follow up  Hypertension Blood pressures still soft but improved.  -continue off BP meds  Chronic venous insufficiency Increased swelling in legs, now with bulla from edema. No evidence of infection.  -bilateral Unna boots applied -return in one week

## 2017-02-07 NOTE — Assessment & Plan Note (Signed)
Blood pressures still soft but improved.  -continue off BP meds

## 2017-02-10 ENCOUNTER — Telehealth (HOSPITAL_COMMUNITY): Payer: Self-pay | Admitting: *Deleted

## 2017-02-10 ENCOUNTER — Other Ambulatory Visit: Payer: Self-pay | Admitting: *Deleted

## 2017-02-10 DIAGNOSIS — D696 Thrombocytopenia, unspecified: Secondary | ICD-10-CM | POA: Diagnosis not present

## 2017-02-10 DIAGNOSIS — E669 Obesity, unspecified: Secondary | ICD-10-CM | POA: Diagnosis not present

## 2017-02-10 DIAGNOSIS — I5023 Acute on chronic systolic (congestive) heart failure: Secondary | ICD-10-CM | POA: Diagnosis not present

## 2017-02-10 DIAGNOSIS — G40909 Epilepsy, unspecified, not intractable, without status epilepticus: Secondary | ICD-10-CM | POA: Diagnosis not present

## 2017-02-10 DIAGNOSIS — K219 Gastro-esophageal reflux disease without esophagitis: Secondary | ICD-10-CM | POA: Diagnosis not present

## 2017-02-10 DIAGNOSIS — I482 Chronic atrial fibrillation: Secondary | ICD-10-CM | POA: Diagnosis not present

## 2017-02-10 DIAGNOSIS — I110 Hypertensive heart disease with heart failure: Secondary | ICD-10-CM | POA: Diagnosis not present

## 2017-02-10 DIAGNOSIS — G4733 Obstructive sleep apnea (adult) (pediatric): Secondary | ICD-10-CM | POA: Diagnosis not present

## 2017-02-10 DIAGNOSIS — I872 Venous insufficiency (chronic) (peripheral): Secondary | ICD-10-CM | POA: Diagnosis not present

## 2017-02-10 LAB — POCT INR: INR: 2.5

## 2017-02-10 NOTE — Telephone Encounter (Signed)
Misty Gray with HH called to let us know that today is their last visit with patient and they were discharging her and letting Misty Gray with Paramed continue to follow up with patient.  She also reported that patient was advised from one of her other doctors to increase torsemide to 80 mg BID for 3 days but did not increase potassium and Misty Gray was concerned.  I spoke with Misty Gray, our NP and she advises patient to take an extra 40 mg of torsemide along with an extra 20 mEq of potassium BID for two days.  We can check labs at patient's appointment with [place] later this week.   Misty Gray with HH is agreeable with plan.  No further questions at this time.

## 2017-02-13 ENCOUNTER — Other Ambulatory Visit (HOSPITAL_COMMUNITY): Payer: Self-pay

## 2017-02-13 ENCOUNTER — Encounter (HOSPITAL_COMMUNITY): Payer: Self-pay | Admitting: Internal Medicine

## 2017-02-13 ENCOUNTER — Ambulatory Visit (HOSPITAL_COMMUNITY)
Admission: RE | Admit: 2017-02-13 | Discharge: 2017-02-13 | Disposition: A | Discharge status: Home / Self Care | Payer: Medicare HMO | Source: Ambulatory Visit | Attending: Family Medicine | Admitting: Family Medicine

## 2017-02-13 ENCOUNTER — Ambulatory Visit (HOSPITAL_BASED_OUTPATIENT_CLINIC_OR_DEPARTMENT_OTHER)
Admission: RE | Admit: 2017-02-13 | Discharge: 2017-02-13 | Disposition: A | Discharge status: Home / Self Care | Payer: Medicare HMO | Source: Ambulatory Visit | Attending: Internal Medicine | Admitting: Internal Medicine

## 2017-02-13 VITALS — BP 104/72 | HR 81 | Wt 197.5 lb

## 2017-02-13 DIAGNOSIS — Z808 Family history of malignant neoplasm of other organs or systems: Secondary | ICD-10-CM | POA: Diagnosis not present

## 2017-02-13 DIAGNOSIS — Z79899 Other long term (current) drug therapy: Secondary | ICD-10-CM | POA: Diagnosis not present

## 2017-02-13 DIAGNOSIS — I5022 Chronic systolic (congestive) heart failure: Secondary | ICD-10-CM | POA: Insufficient documentation

## 2017-02-13 DIAGNOSIS — I519 Heart disease, unspecified: Secondary | ICD-10-CM

## 2017-02-13 DIAGNOSIS — Z888 Allergy status to other drugs, medicaments and biological substances status: Secondary | ICD-10-CM | POA: Insufficient documentation

## 2017-02-13 DIAGNOSIS — Z87891 Personal history of nicotine dependence: Secondary | ICD-10-CM | POA: Diagnosis not present

## 2017-02-13 DIAGNOSIS — I110 Hypertensive heart disease with heart failure: Secondary | ICD-10-CM | POA: Insufficient documentation

## 2017-02-13 DIAGNOSIS — G4733 Obstructive sleep apnea (adult) (pediatric): Secondary | ICD-10-CM | POA: Diagnosis not present

## 2017-02-13 DIAGNOSIS — I272 Pulmonary hypertension, unspecified: Secondary | ICD-10-CM | POA: Diagnosis not present

## 2017-02-13 DIAGNOSIS — E669 Obesity, unspecified: Secondary | ICD-10-CM | POA: Insufficient documentation

## 2017-02-13 DIAGNOSIS — Z5189 Encounter for other specified aftercare: Secondary | ICD-10-CM | POA: Insufficient documentation

## 2017-02-13 DIAGNOSIS — I878 Other specified disorders of veins: Secondary | ICD-10-CM | POA: Diagnosis not present

## 2017-02-13 DIAGNOSIS — Z823 Family history of stroke: Secondary | ICD-10-CM | POA: Diagnosis not present

## 2017-02-13 DIAGNOSIS — I083 Combined rheumatic disorders of mitral, aortic and tricuspid valves: Secondary | ICD-10-CM | POA: Insufficient documentation

## 2017-02-13 DIAGNOSIS — Z7901 Long term (current) use of anticoagulants: Secondary | ICD-10-CM | POA: Diagnosis not present

## 2017-02-13 DIAGNOSIS — Z880 Allergy status to penicillin: Secondary | ICD-10-CM | POA: Diagnosis not present

## 2017-02-13 DIAGNOSIS — I493 Ventricular premature depolarization: Secondary | ICD-10-CM | POA: Diagnosis not present

## 2017-02-13 DIAGNOSIS — I959 Hypotension, unspecified: Secondary | ICD-10-CM | POA: Insufficient documentation

## 2017-02-13 MED ORDER — TORSEMIDE 20 MG PO TABS: ORAL_TABLET | ORAL | Status: DC

## 2017-02-13 MED ORDER — AMIODARONE HCL 200 MG PO TABS: 200.0000 mg | ORAL_TABLET | Freq: Every day | ORAL | 5 refills | Status: DC

## 2017-02-13 NOTE — Progress Notes (Signed)
Paramedicine Encounter    Patient ID: Misty Gray, female    DOB: [birth year], 66 y.o.   MRN: [id number]   Patient Care Team: Uvaldo Rising, MD as PCP - General (Family Medicine) [name], MD (Cardiology) Kemper Durie, RN as [facility] Care Management  Patient Active Problem List   Diagnosis Date Noted  . Chronic venous stasis dermatitis 12/26/2016  . Urinary tract infection without hematuria   . Frequent PVCs   . Hypokalemia   . Somnolence   . Altered mental status 11/28/2016  . Housing problems 11/23/2016  . Chronic atrial fibrillation (HCC) 11/01/2016  . Hypotension 10/31/2016  . Special screening for malignant neoplasms, colon 08/12/2016  . Heme positive stool 08/09/2016  . Intertrigo 08/09/2016  . Chronic anticoagulation 04/26/2016  . Chronic venous insufficiency   . Preventative health care 10/05/2014  . Breast mass, left 10/05/2014  . Osteopenia 09/15/2014  . Hypertension 01/04/2014  . Chronic systolic dysfunction of left ventricle 05/05/2013  . At high risk for falls 02/11/2013  . Vitamin D deficiency 07/15/2012  . Fatigue 06/22/2012  . Persistent atrial fibrillation (HCC) 06/14/2012  . Sinoatrial node dysfunction (HCC) 05/08/2011  . Allergic rhinitis 05/31/2008  . Morbid obesity (HCC) 02/12/2007  . GASTROESOPHAGEAL REFLUX, NO ESOPHAGITIS 02/12/2007  . Female stress incontinence 02/12/2007  . Seizure disorder (HCC) 02/12/2007  . OSA (obstructive sleep apnea) 02/12/2007    Current Outpatient Prescriptions:  .  amiodarone (PACERONE) 200 MG tablet, Take 1 tablet (200 mg total) by mouth daily., Disp: 60 tablet, Rfl: 5 .  cholecalciferol (VITAMIN D) 1000 units tablet, Take 1,000 Units by mouth daily., Disp: , Rfl:  .  metolazone (ZAROXOLYN) 2.5 MG tablet, Take 2.5 mg by mouth as needed (weight gain or shortness of breath)., Disp: , Rfl:  .  omeprazole (PRILOSEC) 20 MG capsule, TAKE ONE CAPSULE BY MOUTH EVERY DAY, Disp: 90 capsule, Rfl: 1 .  OXYGEN,  Inhale 2 L into the lungs at bedtime. cpap machine, Disp: , Rfl:  .  PHENobarbital (LUMINAL) 32.4 MG tablet, Take 2 tablets (64.8 mg total) by mouth 2 (two) times daily., Disp: 120 tablet, Rfl: 5 .  potassium chloride (K-DUR,KLOR-CON) 10 MEQ tablet, Take 4 tablets (40 mEq total) by mouth 2 (two) times daily., Disp: 240 tablet, Rfl: 3 .  torsemide (DEMADEX) 20 MG tablet, Take 2 tabs in AM and 1 tab in PM, Disp: , Rfl:  .  warfarin (COUMADIN) 5 MG tablet, 2 tablets (10 mg) on Monday and Thursday;  1 tablet (5 mg) all other days, Disp: 100 tablet, Rfl: 1 .  fluticasone (FLONASE) 50 MCG/ACT nasal spray, Place 2 sprays into both nostrils daily as needed. For congestion. (Patient not taking: Reported on 02/13/2017), Disp: 16 g, Rfl: 1 Allergies  Allergen Reactions  . Aspirin Hives and Rash  . Penicillins Rash and Other (See Comments)    Amoxicillin also, Has patient had a PCN reaction causing immediate rash, facial/tongue/throat swelling, SOB or lightheadedness with hypotension: Yes Has patient had a PCN reaction causing severe rash involving mucus membranes or skin necrosis: No Has patient had a PCN reaction that required hospitalization No Has patient had a PCN reaction occurring within the last 10 years: Yes If all of the above answers are "NO", then may proceed with Cephalosporin use.   . Tape Other (See Comments)    NO PAPER TAPE-MUST BE MEDICAL TAPE  . Wool Alcohol [Lanolin] Hives  . Amoxicillin Rash  . Ampicillin Itching and Rash  Social History   Social History  . Marital status: Married    Spouse name: Misty Gray  . Number of children: 4  . Years of education: 12   Occupational History  . disabled    Social History Main Topics  . Smoking status: Former Smoker    Packs/day: 1.00    Years: 7.00    Types: Cigarettes    Quit date: 03/16/1969  . Smokeless tobacco: Never Used  . Alcohol use No  . Drug use: No  . Sexual activity: Not Currently    Birth control/ protection:  Post-menopausal   Other Topics Concern  . Not on file   Social History Narrative   Lives with husband Misty Gray - Misty Gray lives with them as do his 2 children   Daughter, Misty Gray, and her 3 children live in [place] -  Misty Gray is incarcerated   Nephew, Misty Gray lives with them   Daughter, Misty Gray, in [place] [name] [address]   Moved June 2013 to a better home on [facility].      Lives with husband, son, grandson, granddaughter, nephew.    Physical Exam  Constitutional: She is oriented to person, place, and time. She appears well-developed.  Neck: Normal range of motion.  Pulmonary/Chest: Effort normal and breath sounds normal.  Abdominal: Soft.  Musculoskeletal: She exhibits edema.  Neurological: She is oriented to person, place, and time.  Skin: Skin is warm and dry.  Psychiatric: She has a normal mood and affect.        Future Appointments Date Time Provider Department Center  02/14/2017 9:15 AM [facility] LAB [facility]  02/14/2017 9:30 AM [facility] NURSE [facility]  02/18/2017 1:00 PM Kemper Durie, RN [facility] None  02/20/2017 9:30 AM Uvaldo Rising, MD [facility]  02/24/2017 11:45 AM [name], MD [name]  04/16/2017 1:40 PM Dolores Patty, MD [facility] None   BP 90/60   Pulse 72   Resp 16   Wt 197 lb 12.8 oz (89.7 kg)   BMI 36.18 kg/m  Weight yesterday-unable to weigh  Last visit weight-unable to weigh   Came to the home for a med rec after her clinic appointment since there were med changes today-she missed last nights dose of meds- meds verified and pill box refilled today.  She is moving to another house next week so she will be able to resume weighing daily. No dizziness. No h/a. Pt reports sob at times. Will f/u next week.  Her legs are still wrapped by PCP. + edema noted to her LE.   ACTION: Home visit completed Next visit planned for next week    Kerry Hough, EMT-Paramedic   02/13/17

## 2017-02-13 NOTE — Progress Notes (Signed)
  Echocardiogram 2D Echocardiogram has been performed.  Misty Gray 02/13/2017, 2:29 PM

## 2017-02-13 NOTE — Progress Notes (Signed)
Paramedicine Encounter    Patient ID: Misty Gray, female    DOB: [birth year], 66 y.o.   MRN: [id number]   Patient Care Team: Lupita Dawn, MD as PCP - General (Family Medicine) [name], MD (Cardiology) Valente David, RN as [place] Management  Patient Active Problem List   Diagnosis Date Noted  . Chronic venous stasis dermatitis 12/26/2016  . Urinary tract infection without hematuria   . Frequent PVCs   . Hypokalemia   . Somnolence   . Altered mental status 11/28/2016  . Housing problems 11/23/2016  . Chronic atrial fibrillation ([place]) 11/01/2016  . Hypotension 10/31/2016  . Special screening for malignant neoplasms, colon 08/12/2016  . Heme positive stool 08/09/2016  . Intertrigo 08/09/2016  . Chronic anticoagulation 04/26/2016  . Chronic venous insufficiency   . Preventative health care 10/05/2014  . Breast mass, left 10/05/2014  . Osteopenia 09/15/2014  . Hypertension 01/04/2014  . Chronic systolic dysfunction of left ventricle 05/05/2013  . At high risk for falls 02/11/2013  . Vitamin D deficiency 07/15/2012  . Fatigue 06/22/2012  . Persistent atrial fibrillation ([place]) 06/14/2012  . Sinoatrial node dysfunction ([place]) 05/08/2011  . Allergic rhinitis 05/31/2008  . Morbid obesity ([place]) 02/12/2007  . GASTROESOPHAGEAL REFLUX, NO ESOPHAGITIS 02/12/2007  . Female stress incontinence 02/12/2007  . Seizure disorder ([place]) 02/12/2007  . OSA (obstructive sleep apnea) 02/12/2007    Current Outpatient Prescriptions:  .  amiodarone (PACERONE) 200 MG tablet, Take 1 tablet (200 mg total) by mouth daily., Disp: 60 tablet, Rfl: 5 .  cholecalciferol (VITAMIN D) 1000 units tablet, Take 1,000 Units by mouth daily., Disp: , Rfl:  .  fluticasone (FLONASE) 50 MCG/ACT nasal spray, Place 2 sprays into both nostrils daily as needed. For congestion. (Patient not taking: Reported on 02/13/2017), Disp: 16 g, Rfl: 1 .  metolazone (ZAROXOLYN) 2.5 MG tablet, Take 2.5 mg by  mouth as needed (weight gain or shortness of breath)., Disp: , Rfl:  .  omeprazole (PRILOSEC) 20 MG capsule, TAKE ONE CAPSULE BY MOUTH EVERY DAY, Disp: 90 capsule, Rfl: 1 .  OXYGEN, Inhale 2 L into the lungs at bedtime. cpap machine, Disp: , Rfl:  .  PHENobarbital (LUMINAL) 32.4 MG tablet, Take 2 tablets (64.8 mg total) by mouth 2 (two) times daily., Disp: 120 tablet, Rfl: 5 .  potassium chloride (K-DUR,KLOR-CON) 10 MEQ tablet, Take 4 tablets (40 mEq total) by mouth 2 (two) times daily., Disp: 240 tablet, Rfl: 3 .  torsemide (DEMADEX) 20 MG tablet, Take 2 tabs in AM and 1 tab in PM, Disp: , Rfl:  .  warfarin (COUMADIN) 5 MG tablet, 2 tablets (10 mg) on Monday and Thursday;  1 tablet (5 mg) all other days, Disp: 100 tablet, Rfl: 1 Allergies  Allergen Reactions  . Aspirin Hives and Rash  . Penicillins Rash and Other (See Comments)    Amoxicillin also, Has patient had a PCN reaction causing immediate rash, facial/tongue/throat swelling, SOB or lightheadedness with hypotension: Yes Has patient had a PCN reaction causing severe rash involving mucus membranes or skin necrosis: No Has patient had a PCN reaction that required hospitalization No Has patient had a PCN reaction occurring within the last 10 years: Yes If all of the above answers are "NO", then may proceed with Cephalosporin use.   . Tape Other (See Comments)    NO PAPER TAPE-MUST BE MEDICAL TAPE  . Wool Alcohol [Lanolin] Hives  . Amoxicillin Rash  . Ampicillin Itching and Rash  Social History   Social History  . Marital status: Married    Spouse name: [name]  . Number of children: 4  . Years of education: 12   Occupational History  . disabled    Social History Main Topics  . Smoking status: Former Smoker    Packs/day: 1.00    Years: 7.00    Types: Cigarettes    Quit date: 03/16/1969  . Smokeless tobacco: Never Used  . Alcohol use No  . Drug use: No  . Sexual activity: Not Currently    Birth control/ protection:  Post-menopausal   Other Topics Concern  . Not on file   Social History Narrative   Lives with husband [name] - [name] lives with them as do his 2 children   Daughter, [name], and her 3 children live in [place] -  [name] is incarcerated   Nephew, Tuyen Uncapher lives with them   Daughter, Vickii Chafe, in [place] June 2013 to a better home on [facility].      Lives with husband, son, grandson, granddaughter, nephew.    Physical Exam      Future Appointments Date Time Provider [place]  02/14/2017 9:15 AM [facility] LAB [facility] [place]  02/14/2017 9:30 AM [facility] NURSE [facility] [place]  02/18/2017 1:00 PM Valente David, RN [facility]COM None  02/20/2017 9:30 AM Lupita Dawn, MD [facility]  04/16/2017 1:40 PM Jolaine Artist, MD [facility] None   Met pt in clinic today however they were able to take her sooner than 3pm so I was able to be there at the end for the med changes--so I will go out to the home directly to make the changes in the pill box. Home health has d/c pt.  [name], EMT-Paramedic  02/13/17    ACTION: Next visit planned for today after clinic to apply the changes to her pill box

## 2017-02-13 NOTE — Progress Notes (Signed)
ADVANCED HF CLINIC NOTE Primary HF: Dr. Gala Romney   HPI: Ms. Harbold is a63 y/o woman with history of morbid obesity, OSA, chronic atrial fibrillation with tachy-brady syndrome s/p PPM, frequent PVCs, systolic HF due to NICM.  Admitted for ADHF in 10/17. Cath with normal coronary arteries and elevated filling pressures. Echo 10/17 EF 30-35% (previously 35-40%). 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 (She has 81% RV pacing and 15-20 PVCs per minute). Started on po amio with suppression of PVCs. Diuresed 41 pounds. Discharge weight 197.   Admitted 12/14 through 12/06/2016 with marked volume overload. Diuresed with IV lasix + metolazone. Discharge weight was 195 pounds.Transitioned to torsemide 40 mg in am and 20 mg in pm.   She returns today for regular follow up.  Weight continues to climb. Up 6 lbs since last visit. Ten pounds in the last month. Drinking a lot of water and tea. Taking torsemide 20 bid. Doesn't get around much. Easily fatigued. Wearing UNNA boots but anxious to get them off. Not watching intake at all. + edema and ab bloating.  Echo today reviewed personally. EF 10-15% moderate MR/TR  RV modrately HK   R/LHC 10/03/16 Ao = 94/58 (72) LV = 104/20 RA = 21 RV = 65/4/19 PA = 65/21 (38) PCW = 23 Fick cardiac output/index = 6.1/2.9 PVR = 2.5 WU FA sat = 97%  PA sat = 69%, 70%  Assessment: 1. Normal coronary arteries 2. NICM with EF 30-35% by echo 3. Persistently elevated biventricular pressures    Past Medical History:  Diagnosis Date  . Abnormal CT of the head 12/16/1984   R parietal atrophy  . Allergic rhinitis, cause unspecified   . Anemia   . Arthritis    "hands and knees" (09/06/2015)  . Atrial fibrillation (HCC)   . Cellulitis 09/07/2015  . Cellulitis and abscess of leg 09/2016   bilateral  . CHF (congestive heart failure) (HCC)   . Diaphragmatic hernia without mention of obstruction or gangrene   . Dyspnea   . Exertional dyspnea 06/22/12  . Family  history of adverse reaction to anesthesia    "daughter fights w/them; just can't relax during OR; stay awake during the OR"  . Female stress incontinence   . GERD (gastroesophageal reflux disease)   . Grand mal seizure (HCC)   . H/O hiatal hernia    two removed  . Heart murmur   . High cholesterol   . History of blood transfusion 1956   "related to nose bleed"  . Hypertension   . Influenza A 01/11/2014  . Lichenification and lichen simplex chronicus   . Migraine    "when I was a teenager"  . Morbid obesity (HCC)   . Nonischemic cardiomyopathy (HCC)    EF has normalized-repeat Pending   . On home oxygen therapy    "2L when I'm asleep in bed" (09/06/2015)  . OSA on CPAP   . Pacemaker  st judes    Patient states "this is my third pacemaker"  . Pneumonia 2004; 2011; 11/2011  . Seizures (HCC) since 1981   "can hear you talking but sounds like you are in big tunnel; have them often if not taking RX; last one was 06/17/12" (09/06/2015)  . Shortness of breath 10/24/2010   Qualifier: Diagnosis of  By: [place], Bonnie    . Sick sinus syndrome (HCC)    WITH PRIOR DDD PACEMAKER IMPLANTATION  . Stroke [facility]) 1988   "mouth drawed real bad on my left side; found out  it was a seizure"  . Syncope and collapse 06/22/12   "hit forehead and left knee"; denies loss of consciousness  . Unspecified venous (peripheral) insufficiency     Current Outpatient Prescriptions  Medication Sig Dispense Refill  . amiodarone (PACERONE) 200 MG tablet Take 1 tablet (200 mg total) by mouth 2 (two) times daily. 60 tablet 5  . cholecalciferol (VITAMIN D) 1000 units tablet Take 1,000 Units by mouth daily.    Marland Kitchen omeprazole (PRILOSEC) 20 MG capsule TAKE ONE CAPSULE BY MOUTH EVERY DAY 90 capsule 1  . PHENobarbital (LUMINAL) 32.4 MG tablet Take 2 tablets (64.8 mg total) by mouth 2 (two) times daily. 120 tablet 5  . potassium chloride (K-DUR,KLOR-CON) 10 MEQ tablet Take 4 tablets (40 mEq total) by mouth 2 (two) times daily. 240  tablet 3  . torsemide (DEMADEX) 20 MG tablet Take 2 tablets (40 mg total) by mouth daily.    Marland Kitchen warfarin (COUMADIN) 5 MG tablet 2 tablets (10 mg) on Monday and Thursday;  1 tablet (5 mg) all other days 100 tablet 1  . fluticasone (FLONASE) 50 MCG/ACT nasal spray Place 2 sprays into both nostrils daily as needed. For congestion. (Patient not taking: Reported on 02/13/2017) 16 g 1  . metolazone (ZAROXOLYN) 2.5 MG tablet Take 2.5 mg by mouth as needed (weight gain or shortness of breath).    . OXYGEN Inhale 2 L into the lungs at bedtime. cpap machine     No current facility-administered medications for this encounter.     Allergies  Allergen Reactions  . Aspirin Hives and Rash  . Penicillins Rash and Other (See Comments)    Amoxicillin also, Has patient had a PCN reaction causing immediate rash, facial/tongue/throat swelling, SOB or lightheadedness with hypotension: Yes Has patient had a PCN reaction causing severe rash involving mucus membranes or skin necrosis: No Has patient had a PCN reaction that required hospitalization No Has patient had a PCN reaction occurring within the last 10 years: Yes If all of the above answers are "NO", then may proceed with Cephalosporin use.   . Tape Other (See Comments)    NO PAPER TAPE-MUST BE MEDICAL TAPE  . Wool Alcohol [Lanolin] Hives  . Amoxicillin Rash  . Ampicillin Itching and Rash      Social History   Social History  . Marital status: Married    Spouse name: [name]  . Number of children: 4  . Years of education: 12   Occupational History  . disabled    Social History Main Topics  . Smoking status: Former Smoker    Packs/day: 1.00    Years: 7.00    Types: Cigarettes    Quit date: 03/16/1969  . Smokeless tobacco: Never Used  . Alcohol use No  . Drug use: No  . Sexual activity: Not Currently    Birth control/ protection: Post-menopausal   Other Topics Concern  . Not on file   Social History Narrative   Lives with husband  [name] lives with them as do his 2 children   Daughter, [name], and her 3 children live in [place] -  [name] is incarcerated   Nephew, Nixie Laube lives with them   Daughter, [name], in [name] [address]   Moved June 2013 to a better home on [facility].      Lives with husband, son, grandson, granddaughter, nephew.      Family History  Problem Relation Age of Onset  . Stroke Father     died from CAD?  Marland Kitchen Heart disease Father   . Cancer Mother   . Diabetes Son     Type 1  . Sleep apnea Son   . Cancer Sister     cervical  . Hypertension Sister   . Hypertension Brother   . Rheum arthritis Daughter     Also PGM, PGGM    Vitals:   02/13/17 1423  BP: 104/72  Pulse: 81  SpO2: 93%  Weight: 197 lb 8 oz (89.6 kg)   Wt Readings from Last 3 Encounters:  02/13/17 197 lb 8 oz (89.6 kg)  02/07/17 191 lb (86.6 kg)  01/24/17 188 lb (85.3 kg)   PHYSICAL EXAM:  General: Son and granddaugher present. Walked into clinic without difficulty.   HEENT: Normal.  Neck: supple. JVPto jaw. Carotids 2+ bilat; no bruits. No thyromegaly or nodule noted.  Cor: PMI nonpalpable Irregular. 2/6 TR/MR Lungs: Clear, normal effort.    Abdomen: Obese, + abdominal hernia. Soft, NT, ND, no HSM. +BS  Extremities: no cyanosis, clubbing, rash, Chronic 2+ edema.   Neuro: alert & orientedx3, cranial nerves grossly intact. moves all 4 extremities w/o difficulty. Affect pleasant  ASSESSMENT & PLAN: 1. Chronic systolic HF due to NICM. ? PVC induced. Echo 12/06/2016 EF 10-15%. Cath 10/17 normal coronary arteries.   --Echo today reviewed personally EF remains 10-15% with moderate RV HK despite suppression of PVCs. ? RV pacing myopathy - Volume status elevated again in setting of dietary noncomplaince - Increase torsemide to 40/20 - Unable to tolerate BB due to low output.  - BP has been too soft for ACE/ARB/ARNI - Refer to EP for consideration  of CRT-P vs CRT-D - Reinforced fluid restriction to < 2 L daily, sodium restriction to less than 2000 mg daily, and the importance of daily weights.   2. Frequent PVCs - PVCs suppressed with amio. Will continue for now at 200 daily.  3. Obesity  - Discussed need for weight loss efforts 4. Hypotension - Too soft for any up-titration of meds.  5. Chronic AF with tach brady s/p pacemaker - Continue coumadin. Could consider switch to DOAC in future.  6. Chronic venous stasis - Unna boots  Continue paramedicine.   Arvilla Meres, MD  2:34 PM

## 2017-02-13 NOTE — Patient Instructions (Signed)
Increase Torsemide to 40 mg (2 tabs) in AM and 20 mg (1 tab) in PM   Decrease Amiodarone to 200 mg daily  You have been referred to Dr Johney Frame  Your physician recommends that you schedule a follow-up appointment in: 2 months

## 2017-02-14 ENCOUNTER — Ambulatory Visit: Payer: Medicare HMO

## 2017-02-14 ENCOUNTER — Encounter: Payer: Self-pay | Admitting: Internal Medicine

## 2017-02-17 NOTE — Progress Notes (Signed)
   Subjective:    Patient ID: Misty Gray, female    DOB: Sep 17, 1951, 66 y.o.   MRN: [id number]  HPI 66 y/o female presents for routine follow up.   Systolic CHF Seen by Cardiology on 02/13/17. Torsemide changed to 40 mg in the AM and 20 mg in the PM. Amiodarone kept at 200 mg daily. Referred to EP for consideration of pace maker change. Breathing at baseline, no chest pain.   Venous Insufficiency Unna boots placed 2/23, took off upper layer a few days ago as was falling off  Afib/Chronic Anticoagulation INR check today, no active bleeding  HM Due for colonoscopy (not a good candidate for colonoscopy based on multiple medical issues). Seen by GI on 01/07/17.   Social Recently moved into new home  Review of Systems  Constitutional: Negative for chills and fever.  Respiratory: Negative for chest tightness and shortness of breath.   Cardiovascular: Positive for leg swelling. Negative for chest pain.       Objective:   Physical Exam BP 100/60   Pulse 72   Temp 97.5 F (36.4 C) (Oral)   Wt 194 lb 3.2 oz (88.1 kg)   SpO2 92%   BMI 35.52 kg/m   Gen: pleasant female, NAD Cardiac: RRR, S1 and S3 present, 3/6 systolic murmur today Resp: normal effort, crackles in bilateral bases (L>R) Ext: 2+ edema to knees, no skin breakdown  INR 1.9 today (missed dose earlier this week)    Assessment & Plan:  Chronic systolic dysfunction of left ventricle Weight stable. Keep Torsemide at 40 in AM and 20 in PM per cardiology recs.   Chronic atrial fibrillation (HCC) Rate controlled. Cardiology recently referred to EP for consideration of pacer change.   Chronic venous insufficiency Improved however she still has significant LE edema. -change Unna boots today -return in one week  Chronic anticoagulation INR 1.9 today. Likely due to missed dose. No change in therapy today.

## 2017-02-18 ENCOUNTER — Other Ambulatory Visit: Payer: Self-pay | Admitting: *Deleted

## 2017-02-18 NOTE — Patient Outreach (Signed)
[facility]) Care Management   02/18/2017  Misty Gray March 11, 1951 [id number]  Misty Gray is an 66 y.o. female  Subjective:   Objective:   Review of Systems  Constitutional: Negative.   HENT: Negative.   Eyes: Negative.   Respiratory: Negative.   Cardiovascular: Positive for leg swelling.  Gastrointestinal: Negative.   Genitourinary: Negative.   Musculoskeletal: Negative.   Skin: Negative.   Neurological: Negative.   Endo/Heme/Allergies: Negative.   Psychiatric/Behavioral: Negative.     Physical Exam  Constitutional: She is oriented to person, place, and time. She appears well-developed and well-nourished.  Neck: Normal range of motion.  Cardiovascular: Normal rate and regular rhythm.   Respiratory: Effort normal and breath sounds normal.  GI: Soft. Bowel sounds are normal. She exhibits distension.  Musculoskeletal: Normal range of motion.  Neurological: She is alert and oriented to person, place, and time.  Skin: Skin is warm and dry.   BP (!) 81/65   Pulse 67   Resp 18   Wt 193 lb 12.8 oz (87.9 kg)   SpO2 98%   BMI 35.45 kg/m   Encounter Medications:   Outpatient Encounter Prescriptions as of 02/18/2017  Medication Sig  . amiodarone (PACERONE) 200 MG tablet Take 1 tablet (200 mg total) by mouth daily.  . cholecalciferol (VITAMIN D) 1000 units tablet Take 1,000 Units by mouth daily.  . metolazone (ZAROXOLYN) 2.5 MG tablet Take 2.5 mg by mouth as needed (weight gain or shortness of breath).  Marland Kitchen omeprazole (PRILOSEC) 20 MG capsule TAKE ONE CAPSULE BY MOUTH EVERY DAY  . OXYGEN Inhale 2 L into the lungs at bedtime. cpap machine  . PHENobarbital (LUMINAL) 32.4 MG tablet Take 2 tablets (64.8 mg total) by mouth 2 (two) times daily.  . potassium chloride (K-DUR,KLOR-CON) 10 MEQ tablet Take 4 tablets (40 mEq total) by mouth 2 (two) times daily.  Marland Kitchen torsemide (DEMADEX) 20 MG tablet Take 2 tabs in AM and 1 tab in PM  . warfarin (COUMADIN) 5 MG tablet 2  tablets (10 mg) on Monday and Thursday;  1 tablet (5 mg) all other days  . fluticasone (FLONASE) 50 MCG/ACT nasal spray Place 2 sprays into both nostrils daily as needed. For congestion. (Patient not taking: Reported on 02/13/2017)   No facility-administered encounter medications on file as of 02/18/2017.     Functional Status:   In your present state of health, do you have any difficulty performing the following activities: 11/28/2016 10/14/2016  Hearing? N N  Vision? N N  Difficulty concentrating or making decisions? N N  Walking or climbing stairs? Y Y  Dressing or bathing? Y N  Doing errands, shopping? Y Y  Conservation officer, nature and eating ? - N  Using the Toilet? - N  In the past six months, have you accidently leaked urine? - Y  Do you have problems with loss of bowel control? - N  Managing your Medications? - N  Managing your Finances? - N  Housekeeping or managing your Housekeeping? - N  Some recent data might be hidden    Fall/Depression Screening:    PHQ 2/9 Scores 02/07/2017 01/24/2017 12/12/2016 11/28/2016 11/21/2016 11/14/2016 10/17/2016  PHQ - 2 Score 0 0 0 0 0 0 0    Assessment:    Met with member at scheduled time.  She has not been compliant with daily weights due to not having level flooring in the home.  She state that they did find a new home and are in  the process of moving, but will not be completely moved in until the weekend.  She report that she will have a more level floor to weigh, which will produce a more accurate reading.    She state that she attended a visit with the heart failure clinic last week (medications adjusted, visit by Misty Gray, Paramedic, to assist with pill box).  She has appointment with her primary MD this week to assess her leg wraps and appointment with cardiologist next week, per patient, to discuss dual chamber pacer implant.  Blood pressure low during assessment, possibly related to increase in Lasix. She report compliance with medications,  however she is not consistently taking her PM medications.  Pill box refilled, has several medications at the pharmacy for refill (call to pharmacy to confirm).  Call placed to Misty Gray to discuss home visit (particularly blood pressure and medications), Misty Gray will follow upon Thursday with home visit.  Member denies any questions at this time, advised to contact with concern.  Plan:   Will follow up within 2 weeks regarding move.  Will schedule home visit for next month at that time.  [facility] CM Care Plan Problem One   Flowsheet Row Most Recent Value  Care Plan Problem One  Knowledge deficit related to management of heart failure as evidenced by inadequate follow through of instructions  Role Documenting the Problem One  Care Management [place] for Problem One  Active  THN Long Term Goal (31-90 days)  Member will be able to verbalize yellow zone of heart failure action plan within the next 31 days  THN Long Term Goal Start Date  01/21/17 [Goal not met, date reset]  THN Long Term Goal Met Date  02/18/17  Interventions for Problem One Long Term Goal  Re-educated member on heart failure zones and when to notify physician.  Member was already provided with [facility] calendar tool with heart failure zones included.  THN CM Short Term Goal #1 (0-30 days)  Member will have new scale within the next 2 weeks  THN CM Short Term Goal #1 Start Date  01/21/17 [Goal not met, date reset]  THN CM Short Term Goal #1 Met Date  02/18/17  Interventions for Short Term Goal #1  Request made to order new scale, provided by [facility], will be delivered to [facility] home.  THN CM Short Term Goal #2 (0-30 days)  Member will weigh self daily and record readings over the next 4 weeks  THN CM Short Term Goal #2 Start Date  02/18/17 [Goal not met, date reset]  Interventions for Short Term Goal #2  Re-educated on importance of daily weights in effort to manage heart failure.  Member made aware that new scale ordered.     THN CM Care Plan Problem Three   Flowsheet Row Most Recent Value  Care Plan Problem Three  Risk for hospital readmit related to heart failure as evidenced by recent hospitalization  Role Documenting the Problem Three  Care Management Coordinator  Care Plan for Problem Three  Not Active  THN Long Term Goal (31-90) days  Member will not be readmitted to hospital wihtin the next 31 days of discharge  THN Long Term Goal Start Date  12/10/16  [facility] Long Term Goal Met Date  01/06/17  Interventions for Problem Three Long Term Goal  Discussed with member the importance of following discharge instructions, including follow up appointments, medications, diet, and home health involvement, to decrease the risk of readmission  THN CM Short Term Goal #1 (0-30 days)  Member will keep and attend both primary MD and cardiology appointments within the next 2 weeks  THN CM Short Term Goal #1 Start Date  12/10/16  [facility] CM Short Term Goal #1 Met Date  12/20/16  Interventions for Short Term Goal #1  Member advised of the importance of attending follow up appointments to decrease risk of readmission.  Provided  member with date & times of both appointments (12/28 & 1/4)  THN CM Short Term Goal #2 (0-30 days)  Member will weigh and record readings daily over the next 4 weeks  THN CM Short Term Goal #2 Start Date  01/06/17 [Date reset]  Interventions for Short Term Goal #2  Member re-educated on the importance of daily weights and when to contact MD     Valente David, RN, MSN [place] Manager [phone]

## 2017-02-20 ENCOUNTER — Other Ambulatory Visit (HOSPITAL_COMMUNITY): Payer: Self-pay

## 2017-02-20 ENCOUNTER — Encounter: Payer: Self-pay | Admitting: Family Medicine

## 2017-02-20 ENCOUNTER — Ambulatory Visit (INDEPENDENT_AMBULATORY_CARE_PROVIDER_SITE_OTHER): Payer: Medicare HMO | Admitting: Family Medicine

## 2017-02-20 VITALS — BP 100/60 | HR 72 | Temp 97.5°F | Wt 194.2 lb

## 2017-02-20 DIAGNOSIS — I482 Chronic atrial fibrillation, unspecified: Secondary | ICD-10-CM

## 2017-02-20 DIAGNOSIS — J309 Allergic rhinitis, unspecified: Secondary | ICD-10-CM | POA: Diagnosis not present

## 2017-02-20 DIAGNOSIS — Z7901 Long term (current) use of anticoagulants: Secondary | ICD-10-CM | POA: Diagnosis not present

## 2017-02-20 DIAGNOSIS — I519 Heart disease, unspecified: Secondary | ICD-10-CM | POA: Diagnosis not present

## 2017-02-20 DIAGNOSIS — I872 Venous insufficiency (chronic) (peripheral): Secondary | ICD-10-CM

## 2017-02-20 LAB — POCT INR: INR: 1.9

## 2017-02-20 MED ORDER — FLUTICASONE PROPIONATE 50 MCG/ACT NA SUSP: 2.0000 | Freq: Every day | NASAL | 1 refills | Status: DC | PRN

## 2017-02-20 NOTE — Assessment & Plan Note (Signed)
Improved however she still has significant LE edema. -change Unna boots today -return in one week

## 2017-02-20 NOTE — Patient Instructions (Signed)
It was nice to see you today.  No changes in your fluid pill today. Continue Torsemide 40 mg in the morning and 20 mg in the PM.  Return in one week to check on [employer].

## 2017-02-20 NOTE — Progress Notes (Signed)
Paramedicine Encounter    Patient ID: Misty Gray, female    DOB: 1951-09-11, 66 y.o.   MRN: [id number]   Patient Care Team: Uvaldo Rising, MD as PCP - General (Family Medicine) [name], MD (Cardiology) Kemper Durie, RN as [facility] Care Management  Patient Active Problem List   Diagnosis Date Noted  . Chronic venous stasis dermatitis 12/26/2016  . Urinary tract infection without hematuria   . Frequent PVCs   . Hypokalemia   . Somnolence   . Altered mental status 11/28/2016  . Housing problems 11/23/2016  . Chronic atrial fibrillation (HCC) 11/01/2016  . Hypotension 10/31/2016  . Special screening for malignant neoplasms, colon 08/12/2016  . Heme positive stool 08/09/2016  . Intertrigo 08/09/2016  . Chronic anticoagulation 04/26/2016  . Chronic venous insufficiency   . Preventative health care 10/05/2014  . Breast mass, left 10/05/2014  . Osteopenia 09/15/2014  . Hypertension 01/04/2014  . Chronic systolic dysfunction of left ventricle 05/05/2013  . At high risk for falls 02/11/2013  . Vitamin D deficiency 07/15/2012  . Fatigue 06/22/2012  . Persistent atrial fibrillation (HCC) 06/14/2012  . Sinoatrial node dysfunction (HCC) 05/08/2011  . Allergic rhinitis 05/31/2008  . Morbid obesity (HCC) 02/12/2007  . GASTROESOPHAGEAL REFLUX, NO ESOPHAGITIS 02/12/2007  . Female stress incontinence 02/12/2007  . Seizure disorder (HCC) 02/12/2007  . OSA (obstructive sleep apnea) 02/12/2007    Current Outpatient Prescriptions:  .  amiodarone (PACERONE) 200 MG tablet, Take 1 tablet (200 mg total) by mouth daily., Disp: 60 tablet, Rfl: 5 .  cholecalciferol (VITAMIN D) 1000 units tablet, Take 1,000 Units by mouth daily., Disp: , Rfl:  .  fluticasone (FLONASE) 50 MCG/ACT nasal spray, Place 2 sprays into both nostrils daily as needed. For congestion., Disp: 16 g, Rfl: 1 .  omeprazole (PRILOSEC) 20 MG capsule, TAKE ONE CAPSULE BY MOUTH EVERY DAY, Disp: 90 capsule, Rfl:  1 .  OXYGEN, Inhale 2 L into the lungs at bedtime. cpap machine, Disp: , Rfl:  .  PHENobarbital (LUMINAL) 32.4 MG tablet, Take 2 tablets (64.8 mg total) by mouth 2 (two) times daily., Disp: 120 tablet, Rfl: 5 .  potassium chloride (K-DUR,KLOR-CON) 10 MEQ tablet, Take 4 tablets (40 mEq total) by mouth 2 (two) times daily., Disp: 240 tablet, Rfl: 3 .  torsemide (DEMADEX) 20 MG tablet, Take 2 tablets by mouth in AM and 1 tablet by mouth in PM, Disp: , Rfl:  .  warfarin (COUMADIN) 5 MG tablet, 2 tablets (10 mg) on Monday and Thursday;  1 tablet (5 mg) all other days, Disp: 100 tablet, Rfl: 1 .  metolazone (ZAROXOLYN) 2.5 MG tablet, Take 2.5 mg by mouth as needed (weight gain or shortness of breath). Take as directed, Disp: , Rfl:  Allergies  Allergen Reactions  . Aspirin Hives and Rash  . Penicillins Rash and Other (See Comments)    Amoxicillin also, Has patient had a PCN reaction causing immediate rash, facial/tongue/throat swelling, SOB or lightheadedness with hypotension: Yes Has patient had a PCN reaction causing severe rash involving mucus membranes or skin necrosis: No Has patient had a PCN reaction that required hospitalization No Has patient had a PCN reaction occurring within the last 10 years: Yes If all of the above answers are "NO", then may proceed with Cephalosporin use.   . Tape Other (See Comments)    NO PAPER TAPE-MUST BE MEDICAL TAPE - unknown reaction  . Wool Alcohol [Lanolin] Hives  . Amoxicillin Rash  . Ampicillin Itching  and Rash     Social History   Social History  . Marital status: Married    Spouse name: [name]  . Number of children: 4  . Years of education: 12   Occupational History  . disabled    Social History Main Topics  . Smoking status: Former Smoker    Packs/day: 1.00    Years: 7.00    Types: Cigarettes    Quit date: 03/16/1969  . Smokeless tobacco: Never Used  . Alcohol use No  . Drug use: No  . Sexual activity: Not Currently    Birth  control/ protection: Post-menopausal   Other Topics Concern  . Not on file   Social History Narrative   Lives with husband [name] lives with them as do his 2 children   Daughter, [name], and her 3 children live in [place] -  [name] is incarcerated   Nephew, [name] lives with them   Daughter, [name], in [name] [address]   Moved June 2013 to a better home on [facility].      Lives with husband, son, grandson, granddaughter, nephew.    Physical Exam      Future Appointments Date Time Provider Department Center  02/24/2017 11:45 AM [name], MD [name]  02/28/2017 11:30 AM Joanna Puff, MD [facility]  03/06/2017 1:45 PM [facility] LAB [facility]  04/16/2017 1:40 PM Dolores Patty, MD [facility] None   BP (!) 80/58   Pulse 84   Resp 16   SpO2 96%  Weight yesterday-188.2 Last visit weight-???   Pt is now in their new house-she doesn't like it since it is too far away from her church and the ladies is not able to come get her and shes unhappy about that. They do not have the heat turned on yet but she is using a space heater in her room. So she has been trying to upack all her things. She did not weigh this morning as she was running late to her PCP visit. Her pill box was filled earlier by [facility] RN on Tuesday-so I filled it up for her this week. She has f/u with PCP on her unna boots.  She is out of her vit D3-[facility].  She denies any dizziness, no sob, she has her legs wrapped. Left message on triage clinic line regarding pts b/p.   Kerry Hough, EMT-Paramedic 02/20/17      ACTION: Home visit completed Next visit planned for 1 week

## 2017-02-20 NOTE — Assessment & Plan Note (Signed)
Weight stable. Keep Torsemide at 40 in AM and 20 in PM per cardiology recs.

## 2017-02-20 NOTE — Assessment & Plan Note (Signed)
INR 1.9 today. Likely due to missed dose. No change in therapy today.

## 2017-02-20 NOTE — Assessment & Plan Note (Signed)
Rate controlled. Cardiology recently referred to EP for consideration of pacer change.

## 2017-02-24 ENCOUNTER — Ambulatory Visit (INDEPENDENT_AMBULATORY_CARE_PROVIDER_SITE_OTHER): Payer: Medicare HMO | Admitting: Internal Medicine

## 2017-02-24 VITALS — BP 124/74 | HR 77 | Resp 93 | Ht 60.0 in | Wt 194.5 lb

## 2017-02-24 DIAGNOSIS — Z7901 Long term (current) use of anticoagulants: Secondary | ICD-10-CM

## 2017-02-24 DIAGNOSIS — I482 Chronic atrial fibrillation, unspecified: Secondary | ICD-10-CM

## 2017-02-24 DIAGNOSIS — I509 Heart failure, unspecified: Secondary | ICD-10-CM

## 2017-02-24 DIAGNOSIS — I519 Heart disease, unspecified: Secondary | ICD-10-CM | POA: Diagnosis not present

## 2017-02-24 DIAGNOSIS — I493 Ventricular premature depolarization: Secondary | ICD-10-CM

## 2017-02-24 DIAGNOSIS — I495 Sick sinus syndrome: Secondary | ICD-10-CM

## 2017-02-24 LAB — CUP PACEART INCLINIC DEVICE CHECK
Battery Voltage: 2.92 V
Brady Statistic RA Percent Paced: 0 %
Implantable Lead Location: 753859
Implantable Pulse Generator Implant Date: 20120605
Lead Channel Pacing Threshold Amplitude: 1 V
Lead Channel Setting Pacing Amplitude: 2.5 V
Lead Channel Setting Pacing Pulse Width: 0.8 ms
MDC IDC LEAD IMPLANT DT: 19920327
MDC IDC LEAD IMPLANT DT: 19920327
MDC IDC LEAD LOCATION: 753860
MDC IDC MSMT LEADCHNL RV IMPEDANCE VALUE: 475 Ohm
MDC IDC MSMT LEADCHNL RV PACING THRESHOLD PULSEWIDTH: 0.8 ms
MDC IDC MSMT LEADCHNL RV SENSING INTR AMPL: 6.4 mV
MDC IDC SESS DTM: 20180312115106
MDC IDC SET LEADCHNL RV SENSING SENSITIVITY: 1.5 mV
MDC IDC STAT BRADY RV PERCENT PACED: 69 %
Pulse Gen Serial Number: 7254513

## 2017-02-24 NOTE — Progress Notes (Signed)
PCP: Uvaldo Rising, MD Primary Cardiologist: [name]  Misty Gray is a 66 y.o. female who presents today for electrophysiology followup.  Since last being seen in our clinic, the patient reports doing reasonablly well.  She denies symptoms with her afib.  She has SOB with moderate activity which is stable. She reports "I have had shortness of breath since I was 12".   + chronic venous insufficiency.  She was hospitalized in December for CHF.  She appears to have been placed on amiodarone at that time also.   Today, she denies symptoms of palpitations, dizziness, presyncope, or syncope.  The patient is otherwise without complaint today.   Past Medical History:  Diagnosis Date  . Abnormal CT of the head 12/16/1984   R parietal atrophy  . Allergic rhinitis, cause unspecified   . Anemia   . Arthritis    "hands and knees" (09/06/2015)  . Atrial fibrillation (HCC)   . Cellulitis 09/07/2015  . Cellulitis and abscess of leg 09/2016   bilateral  . CHF (congestive heart failure) (HCC)   . Diaphragmatic hernia without mention of obstruction or gangrene   . Dyspnea   . Exertional dyspnea 06/22/12  . Family history of adverse reaction to anesthesia    "daughter fights w/them; just can't relax during OR; stay awake during the OR"  . Female stress incontinence   . GERD (gastroesophageal reflux disease)   . Grand mal seizure (HCC)   . H/O hiatal hernia    two removed  . Heart murmur   . High cholesterol   . History of blood transfusion 1956   "related to nose bleed"  . Hypertension   . Influenza A 01/11/2014  . Lichenification and lichen simplex chronicus   . Migraine    "when I was a teenager"  . Morbid obesity (HCC)   . Nonischemic cardiomyopathy (HCC)    EF has normalized-repeat Pending   . On home oxygen therapy    "2L when I'm asleep in bed" (09/06/2015)  . OSA on CPAP   . Pacemaker  st judes    Patient states "this is my third pacemaker"  . Pneumonia 2004; 2011; 11/2011  . Seizures  (HCC) since 1981   "can hear you talking but sounds like you are in big tunnel; have them often if not taking RX; last one was 06/17/12" (09/06/2015)  . Shortness of breath 10/24/2010   Qualifier: Diagnosis of  By: [place], Bonnie    . Sick sinus syndrome (HCC)    WITH PRIOR DDD PACEMAKER IMPLANTATION  . Stroke [facility]) 1988   "mouth drawed real bad on my left side; found out it was a seizure"  . Syncope and collapse 06/22/12   "hit forehead and left knee"; denies loss of consciousness  . Unspecified venous (peripheral) insufficiency    Past Surgical History:  Procedure Laterality Date  . APPENDECTOMY    . CARDIAC CATHETERIZATION N/A 10/03/2016   Procedure: Right/Left Heart Cath and Coronary Angiography;  Surgeon: Dolores Patty, MD;  Location: [facility] INVASIVE CV LAB;  Service: Cardiovascular;  Laterality: N/A;  . HERNIA REPAIR  ? 1988; 04/15/1998  . INSERT / REPLACE / REMOVE PACEMAKER  12/16/1990   DDD EF 30%;  Marland Kitchen INSERT / REPLACE / REMOVE PACEMAKER  07/25/2003   replaced by BB, leads are both IS1 and from 1992  . INSERT / REPLACE / REMOVE PACEMAKER  05/21/2011   JA; generator change  . LEG SKIN LESION  BIOPSY / EXCISION  05/16/2001  Lichen Planus  . VENTRAL HERNIA REPAIR  1989; 04/15/1998    Current Outpatient Prescriptions  Medication Sig Dispense Refill  . amiodarone (PACERONE) 200 MG tablet Take 1 tablet (200 mg total) by mouth daily. 60 tablet 5  . cholecalciferol (VITAMIN D) 1000 units tablet Take 1,000 Units by mouth daily.    . fluticasone (FLONASE) 50 MCG/ACT nasal spray Place 2 sprays into both nostrils daily as needed. For congestion. 16 g 1  . metolazone (ZAROXOLYN) 2.5 MG tablet Take 2.5 mg by mouth as needed (weight gain or shortness of breath). Take as directed    . omeprazole (PRILOSEC) 20 MG capsule TAKE ONE CAPSULE BY MOUTH EVERY DAY 90 capsule 1  . OXYGEN Inhale 2 L into the lungs at bedtime. cpap machine    . PHENobarbital (LUMINAL) 32.4 MG tablet Take 2 tablets (64.8 mg total) by  mouth 2 (two) times daily. 120 tablet 5  . potassium chloride (K-DUR,KLOR-CON) 10 MEQ tablet Take 4 tablets (40 mEq total) by mouth 2 (two) times daily. 240 tablet 3  . torsemide (DEMADEX) 20 MG tablet Take 2 tablets by mouth in AM and 1 tablet by mouth in PM    . warfarin (COUMADIN) 5 MG tablet 2 tablets (10 mg) on Monday and Thursday;  1 tablet (5 mg) all other days 100 tablet 1   No current facility-administered medications for this visit.     Physical Exam: Vitals:   02/24/17 1046  BP: 124/74  Pulse: 77  Resp: (!) 93  Weight: 194 lb 8 oz (88.2 kg)  Height: 5' (1.524 m)    GEN- The patient is overweight and disheveled appearing, chronically ill, alert and oriented x 3 today.   Head- normocephalic, atraumatic Eyes-  Sclera clear, conjunctiva pink Ears- hearing intact Oropharynx- clear Lungs- Clear to ausculation bilaterally, normal work of breathing Chest- pacemaker pocket is well healed Heart- regular rate and rhythm (paced), no murmurs, rubs or gallops, PMI not laterally displaced GI- soft, NT, ND, + BS Extremities- no clubbing, cyanosis, 1+ edema with venous stasis changes  Pacemaker interrogation- personally reviewed in detail today,  See PACEART report ekg today reveals afib with V rate 77 bpm, QRS 96 msec, a single PVC noted  Assessment and Plan:  1. Symptomatic bradycardia Normal pacemaker function See Pace Art report Given V pacing > 40% (currently 81%), I have reprogrammed today to VVIR 45 bpm.   Will continue to follow V paced burden going forward.  Given very narrow intrinsic QRS, I would advise avoiding V pacing rather than CRT as our strategy going forward.  She is clear that she would like to avoid EP procedures.  Return to assess in 3 months  2 afib She has asymptomatic permanent afib Continue coumadin for anticoagulation.  Not a candidate for NOAC therapy presently as she is on phenobarbitol.  No changes today   3. Chronic systolic  dysfunction Stable Given very narrow intrinsic QRS, I would favor above pacing change over CRT at this time. Her ARB was recently discontinued however I cannot find a reason as to why.  She is on neither a beta blocker or Ace inhibitor/ ARB.  I will defer additional medical optimization to the CHF clinic. Poor candidate for upgrade to an ICD.  I would favor a conservative approach for her.  4. PVCs PVCs when in the hospital in December.  It appears that she was started on amiodarone at that time.  I would favor Coreg over amiodarone.  It is  not clear to me as to why she is not on beta blocker.  If ok with Dr Gala Romney, would stop amiodarone when able.  Return to see me in 3 months Willa Rough MD, [facility] 02/24/2017 11:32 AM

## 2017-02-24 NOTE — Patient Instructions (Addendum)
Medication Instructions:  None Ordered  Labwork: None Ordered  Testing/Procedures: None Ordered  Follow-Up: Your physician wants you to follow-up in 3 MONTHS with Dr. Johney Frame.   Any Other Special Instructions Will Be Listed Below (If Applicable).     If you need a refill on your cardiac medications before your next appointment, please call your pharmacy.

## 2017-02-27 ENCOUNTER — Other Ambulatory Visit (HOSPITAL_COMMUNITY): Payer: Self-pay

## 2017-02-27 ENCOUNTER — Other Ambulatory Visit (HOSPITAL_COMMUNITY): Payer: Self-pay | Admitting: Adult Health

## 2017-02-27 ENCOUNTER — Telehealth (HOSPITAL_COMMUNITY): Payer: Self-pay | Admitting: *Deleted

## 2017-02-27 NOTE — Progress Notes (Signed)
Paramedicine Encounter    Patient ID: [name], female    DOB: 05-07-1951, 66 y.o.   MRN: [id number]   Patient Care Team: Uvaldo Rising, MD as PCP - General (Family Medicine) [name], MD (Cardiology) Kemper Durie, RN as [facility] Care Management  Patient Active Problem List   Diagnosis Date Noted  . Chronic venous stasis dermatitis 12/26/2016  . Urinary tract infection without hematuria   . Frequent PVCs   . Hypokalemia   . Somnolence   . Altered mental status 11/28/2016  . Housing problems 11/23/2016  . Chronic atrial fibrillation (HCC) 11/01/2016  . Hypotension 10/31/2016  . Special screening for malignant neoplasms, colon 08/12/2016  . Heme positive stool 08/09/2016  . Intertrigo 08/09/2016  . Chronic anticoagulation 04/26/2016  . Chronic venous insufficiency   . Preventative health care 10/05/2014  . Breast mass, left 10/05/2014  . Osteopenia 09/15/2014  . Hypertension 01/04/2014  . Chronic systolic dysfunction of left ventricle 05/05/2013  . At high risk for falls 02/11/2013  . Vitamin D deficiency 07/15/2012  . Fatigue 06/22/2012  . Sinoatrial node dysfunction (HCC) 05/08/2011  . Allergic rhinitis 05/31/2008  . Morbid obesity (HCC) 02/12/2007  . GASTROESOPHAGEAL REFLUX, NO ESOPHAGITIS 02/12/2007  . Female stress incontinence 02/12/2007  . Seizure disorder (HCC) 02/12/2007  . OSA (obstructive sleep apnea) 02/12/2007    Current Outpatient Prescriptions:  .  amiodarone (PACERONE) 200 MG tablet, Take 1 tablet (200 mg total) by mouth daily., Disp: 60 tablet, Rfl: 5 .  fluticasone (FLONASE) 50 MCG/ACT nasal spray, Place 2 sprays into both nostrils daily as needed. For congestion., Disp: 16 g, Rfl: 1 .  metolazone (ZAROXOLYN) 2.5 MG tablet, Take 2.5 mg by mouth as needed (weight gain or shortness of breath). Take as directed, Disp: , Rfl:  .  omeprazole (PRILOSEC) 20 MG capsule, TAKE ONE CAPSULE BY MOUTH EVERY DAY, Disp: 90 capsule, Rfl: 1 .   PHENobarbital (LUMINAL) 32.4 MG tablet, Take 2 tablets (64.8 mg total) by mouth 2 (two) times daily., Disp: 120 tablet, Rfl: 5 .  potassium chloride (K-DUR,KLOR-CON) 10 MEQ tablet, Take 4 tablets (40 mEq total) by mouth 2 (two) times daily., Disp: 240 tablet, Rfl: 3 .  torsemide (DEMADEX) 20 MG tablet, Take 2 tablets by mouth in AM and 1 tablet by mouth in PM, Disp: , Rfl:  .  warfarin (COUMADIN) 5 MG tablet, 2 tablets (10 mg) on Monday and Thursday;  1 tablet (5 mg) all other days, Disp: 100 tablet, Rfl: 1 .  cholecalciferol (VITAMIN D) 1000 units tablet, Take 1,000 Units by mouth daily., Disp: , Rfl:  .  OXYGEN, Inhale 2 L into the lungs at bedtime. cpap machine, Disp: , Rfl:  Allergies  Allergen Reactions  . Aspirin Hives and Rash  . Penicillins Rash and Other (See Comments)    Amoxicillin also, Has patient had a PCN reaction causing immediate rash, facial/tongue/throat swelling, SOB or lightheadedness with hypotension: Yes Has patient had a PCN reaction causing severe rash involving mucus membranes or skin necrosis: No Has patient had a PCN reaction that required hospitalization No Has patient had a PCN reaction occurring within the last 10 years: Yes If all of the above answers are "NO", then may proceed with Cephalosporin use.   . Tape Other (See Comments)    NO PAPER TAPE-MUST BE MEDICAL TAPE - unknown reaction  . Wool Alcohol [Lanolin] Hives  . Amoxicillin Rash  . Ampicillin Itching and Rash     Social  History   Social History  . Marital status: Married    Spouse name: [name]  . Number of children: 4  . Years of education: 12   Occupational History  . disabled    Social History Main Topics  . Smoking status: Former Smoker    Packs/day: 1.00    Years: 7.00    Types: Cigarettes    Quit date: 03/16/1969  . Smokeless tobacco: Never Used  . Alcohol use No  . Drug use: No  . Sexual activity: Not Currently    Birth control/ protection: Post-menopausal   Other Topics  Concern  . Not on file   Social History Narrative   Lives with husband [name] lives with them as do his 2 children   Daughter, [name], and her 3 children live in [place] -  [name] is incarcerated   Nephew, Anais Mcgroarty lives with them   Daughter, [name], in [facility] [name] [address]   Moved June 2013 to a better home on [facility].      Lives with husband, son, grandson, granddaughter, nephew.    Physical Exam  Constitutional: She is oriented to person, place, and time. She appears well-developed.  Neck: Normal range of motion.  Cardiovascular: Normal rate.   Pulmonary/Chest: Effort normal and breath sounds normal.  Abdominal: Soft. She exhibits no distension.  Musculoskeletal: She exhibits edema.  swelling to her knees above the unna boots  Neurological: She is oriented to person, place, and time.  Skin: Skin is warm and dry.  Psychiatric: She has a normal mood and affect.        Future Appointments Date Time Provider Department Center  02/28/2017 11:30 AM Joanna Puff, MD [facility]  03/06/2017 1:45 PM [facility]  04/16/2017 1:40 PM Dolores Patty, MD [facility] None  05/29/2017 11:00 AM [name], MD [name]   BP 100/68   Pulse 60   Resp 16   Wt 198 lb (89.8 kg)   SpO2 97%   BMI 38.67 kg/m  Weight yesterday-didn't weigh  Last visit weight-188   Pt reports they still do not have the gas hooked up for the heat yet-they are using space heaters. Ive asked them to let me know how much it costs to hook up the gas. She states money is tight right now.  She is not using her 02 as she cant plug in the machine b/c when she does it blows the fuses in the house. I advised them to contact the landlord to tell them of the issue. She isnt able to use the 02 with the heater-due to the cold weather she is choosing to use the heater.  She reports her breathing is doing ok. She moved  in to this house last week and has been able to do minor chores and unpack things without difficulty but she knows her limits and does not over do it.  She missed 3 doses of her meds--she missed her fluid pill yesterday and missed 2 doses over the wknd which has the potassium in there. Her weight is up 4lbs from last week-she reports that she does not feel she has extra fluid on her-she isnt weighing daily-she states the last time she weighed was last week and it was 194.  She ate bowl of black beans and 2 crackers for supper.  She does not have vit d right now-states she will go  to pharmacy to pick up over the counter. Called in torsemide and potassium for her. She still has the unna boots on-she has swelling around her knees. Advised her to elevate her legs and be sure to take all her meds. Left message on triage line regarding pts v/s and weight.   ACTION: Home visit completed Next visit planned for 1 week   Kerry Hough, EMT-Paramedic 02/27/17

## 2017-02-27 NOTE — Telephone Encounter (Signed)
Katie with paramed called to report that patient's weight is still up at 198 lbs, from 194 lb last week. However patient is still noncompliant with dialy weights, dietary, and medications.  She also had increased swelling/tightness around her knees/upper legs, she does have una boots in place.  Patient was reeducated on taking her medications as prescribed, proper dietary/fluid restrictions, checking daily weights, and keeping legs elevated while sitting.    Tonye Becket, NP is aware.  Katie with continue to follow patient, no further changes in care at this time.

## 2017-02-28 ENCOUNTER — Ambulatory Visit (INDEPENDENT_AMBULATORY_CARE_PROVIDER_SITE_OTHER): Payer: Medicare HMO | Admitting: Family Medicine

## 2017-02-28 ENCOUNTER — Encounter: Payer: Self-pay | Admitting: Family Medicine

## 2017-02-28 DIAGNOSIS — I872 Venous insufficiency (chronic) (peripheral): Secondary | ICD-10-CM | POA: Diagnosis not present

## 2017-02-28 DIAGNOSIS — I519 Heart disease, unspecified: Secondary | ICD-10-CM | POA: Diagnosis not present

## 2017-02-28 NOTE — Assessment & Plan Note (Signed)
Improved, but still has significant LE edema. - replaced unna boots today - f/u in 1 week.

## 2017-02-28 NOTE — Assessment & Plan Note (Signed)
Weight stable in our clinic from 3/8.  Continue Torsemide 40mg  in the AM and 20mg  in PM Discussed importance of weighing herself regularly and medication compliance Not on an ACE-I or ARB (looks like she was on this in the past), will leave to PCP or HF clinic. Per EMR, off BB due to h/o hypotension

## 2017-02-28 NOTE — Progress Notes (Signed)
Subjective: CC: f/u venous insuff. HPI: Patient is a 66 y.o. female with a past medical history of venous insufficiency, afib, CHF, bradycardia presenting to clinic today for a f/u on venous insufficiency and CHF.  Systolic CHF Seen by Dr. Johney Frame on 3/12 for her afib and bradycardia- unsure why she wasn't on a BB or ARB or ACE-I but he was going to defer to CHF clinic.  Her weight was noted to be up by HH-RN yesterday and she notes this is because she'd forget to take her torsemide. Currently on torsemide 40 mg in the AM and 20 mg in the PM > she has a phone alarm to remind her now and feels her fluid status is better. Amiodarone kept at 200 mg daily but Dr. Johney Frame is hoping HF clinic will be ok to change this.  EP did make some changes to her pacemaker at her recent visit. Breathing at baseline, no chest pain.  Checks weight every morning before breakfast. Weight at home varie from 198.0- 188lbs per her report. States it was 198 this AM at her home.   Venous Insufficiency Unna boots placed 3/8. Started putting tape on the top layers to keep them on. After removing boots today she feels the swelling is improved but still present, especially on the L LE.   Social History: former smoker   ROS: All other systems reviewed and are negative.  Past Medical History Patient Active Problem List   Diagnosis Date Noted  . Chronic venous stasis dermatitis 12/26/2016  . Urinary tract infection without hematuria   . Frequent PVCs   . Hypokalemia   . Somnolence   . Altered mental status 11/28/2016  . Housing problems 11/23/2016  . Chronic atrial fibrillation (HCC) 11/01/2016  . Hypotension 10/31/2016  . Special screening for malignant neoplasms, colon 08/12/2016  . Heme positive stool 08/09/2016  . Intertrigo 08/09/2016  . Chronic anticoagulation 04/26/2016  . Chronic venous insufficiency   . Preventative health care 10/05/2014  . Breast mass, left 10/05/2014  . Osteopenia 09/15/2014  .  Hypertension 01/04/2014  . Chronic systolic dysfunction of left ventricle 05/05/2013  . At high risk for falls 02/11/2013  . Vitamin D deficiency 07/15/2012  . Fatigue 06/22/2012  . Sinoatrial node dysfunction (HCC) 05/08/2011  . Allergic rhinitis 05/31/2008  . Morbid obesity (HCC) 02/12/2007  . GASTROESOPHAGEAL REFLUX, NO ESOPHAGITIS 02/12/2007  . Female stress incontinence 02/12/2007  . Seizure disorder (HCC) 02/12/2007  . OSA (obstructive sleep apnea) 02/12/2007    Medications- reviewed and updated  Objective: Office vital signs reviewed. BP (!) 96/58   Pulse 67   Temp 97.6 F (36.4 C) (Oral)   Ht 5' (1.524 m)   Wt 195 lb (88.5 kg)   BMI 38.08 kg/m  Weight 3/8: 194lb    Physical Examination:  General: Awake, alert, well- nourished, NAD Cardio: RRR, 3/6 systolic murmur. No thrill. No JVD. Pulm: No increased WOB.  CTAB, without wheezes, rhonchi or crackles noted.  GI: soft, NT/ND,+BS x4, no hepatomegaly, no splenomegaly GU: deffered Extremities: 1-2+ pitting edema in the LE bilaterally, L>R. violaceous changes to LEs but no skin breakdown.   Assessment/Plan: Chronic venous insufficiency Improved, but still has significant LE edema. - replaced unna boots today - f/u in 1 week.  Chronic systolic dysfunction of left ventricle Weight stable in our clinic from 3/8.  Continue Torsemide 40mg  in the AM and 20mg  in PM Discussed importance of weighing herself regularly and medication compliance Not on an ACE-I or  ARB (looks like she was on this in the past), will leave to PCP or HF clinic. Per EMR, off BB due to h/o hypotension   No orders of the defined types were placed in this encounter.   No orders of the defined types were placed in this encounter.   Joanna Puff PGY-3, [facility]

## 2017-02-28 NOTE — Patient Instructions (Signed)
It was good to see you again.  Continue the torsemide 40mg  in the morning and 20mg  in the afternoon.  Follow up in 1 week. At that time we will check your INR again.  Weigh yourself daily in the morning and keep a record of your weight. If your weight increases by more than 2 pounds in 1 day, call the heart failure clinic.

## 2017-03-06 ENCOUNTER — Ambulatory Visit: Payer: Medicare HMO

## 2017-03-06 ENCOUNTER — Other Ambulatory Visit (HOSPITAL_COMMUNITY): Payer: Self-pay

## 2017-03-06 ENCOUNTER — Other Ambulatory Visit: Payer: Self-pay | Admitting: *Deleted

## 2017-03-06 NOTE — Patient Outreach (Signed)
[facility]) Care Management  03/06/2017  Misty Gray Mar 27, 1951 [id number]   Call placed to member to follow up on new living arrangements.  She state that she has settled into the new home, but voices concern about its condition.  She report that she does not have any heat because she has an outstanding bill of $162 that need to be paid before they will reinstate services.  She state that she is unable to pay the fee and has been using space heaters instead.  Her complication with using space heaters is that she report consistently blowing a fuse when she try to plug in several items.  She state that she has not been able to use her oxygen at night due to blowing a fuse.  She report that the landlord was supposed to fix some of the issues in the home, but has not done so at his time.  She agrees to have Scientist, research (medical) with community resources.  Referral to LCSW placed, will follow up within the next 2 weeks.  Kemper Durie, [place], MSN [facility] Care Management  [facility] Manager [phone]

## 2017-03-06 NOTE — Progress Notes (Signed)
Paramedicine Encounter    Patient ID: Misty Gray, female    DOB: [birth year], 66 y.o.   MRN: [id number]   Patient Care Team: Uvaldo Rising, MD as PCP - General (Family Medicine) [name], MD (Cardiology) Kemper Durie, RN as [facility] Care Management Karolee Stamps, LCSW as [facility] Care Management  Patient Active Problem List   Diagnosis Date Noted  . Chronic venous stasis dermatitis 12/26/2016  . Urinary tract infection without hematuria   . Frequent PVCs   . Hypokalemia   . Somnolence   . Altered mental status 11/28/2016  . Housing problems 11/23/2016  . Chronic atrial fibrillation (HCC) 11/01/2016  . Hypotension 10/31/2016  . Special screening for malignant neoplasms, colon 08/12/2016  . Heme positive stool 08/09/2016  . Intertrigo 08/09/2016  . Chronic anticoagulation 04/26/2016  . Chronic venous insufficiency   . Preventative health care 10/05/2014  . Breast mass, left 10/05/2014  . Osteopenia 09/15/2014  . Hypertension 01/04/2014  . Chronic systolic dysfunction of left ventricle 05/05/2013  . At high risk for falls 02/11/2013  . Vitamin D deficiency 07/15/2012  . Fatigue 06/22/2012  . Sinoatrial node dysfunction (HCC) 05/08/2011  . Allergic rhinitis 05/31/2008  . Morbid obesity (HCC) 02/12/2007  . GASTROESOPHAGEAL REFLUX, NO ESOPHAGITIS 02/12/2007  . Female stress incontinence 02/12/2007  . Seizure disorder (HCC) 02/12/2007  . OSA (obstructive sleep apnea) 02/12/2007    Current Outpatient Prescriptions:  .  amiodarone (PACERONE) 200 MG tablet, Take 1 tablet (200 mg total) by mouth daily., Disp: 60 tablet, Rfl: 5 .  cholecalciferol (VITAMIN D) 1000 units tablet, Take 1,000 Units by mouth daily., Disp: , Rfl:  .  fluticasone (FLONASE) 50 MCG/ACT nasal spray, Place 2 sprays into both nostrils daily as needed. For congestion., Disp: 16 g, Rfl: 1 .  omeprazole (PRILOSEC) 20 MG capsule, TAKE ONE CAPSULE BY MOUTH EVERY DAY, Disp: 90  capsule, Rfl: 1 .  PHENobarbital (LUMINAL) 32.4 MG tablet, Take 2 tablets (64.8 mg total) by mouth 2 (two) times daily., Disp: 120 tablet, Rfl: 5 .  potassium chloride (K-DUR,KLOR-CON) 10 MEQ tablet, Take 4 tablets (40 mEq total) by mouth 2 (two) times daily., Disp: 240 tablet, Rfl: 3 .  torsemide (DEMADEX) 20 MG tablet, Take 2 tablets by mouth in AM and 1 tablet by mouth in PM, Disp: , Rfl:  .  torsemide (DEMADEX) 20 MG tablet, Take 1-2 tablets (20-40 mg total) by mouth as directed. Take 40 mg in the AM and 20 mg in the PM, Disp: 90 tablet, Rfl: 6 .  warfarin (COUMADIN) 5 MG tablet, 2 tablets (10 mg) on Monday and Thursday;  1 tablet (5 mg) all other days, Disp: 100 tablet, Rfl: 1 .  metolazone (ZAROXOLYN) 2.5 MG tablet, Take 2.5 mg by mouth as needed (weight gain or shortness of breath). Take as directed, Disp: , Rfl:  .  OXYGEN, Inhale 2 L into the lungs at bedtime. cpap machine, Disp: , Rfl:  Allergies  Allergen Reactions  . Aspirin Hives and Rash  . Penicillins Rash and Other (See Comments)    Amoxicillin also, Has patient had a PCN reaction causing immediate rash, facial/tongue/throat swelling, SOB or lightheadedness with hypotension: Yes Has patient had a PCN reaction causing severe rash involving mucus membranes or skin necrosis: No Has patient had a PCN reaction that required hospitalization No Has patient had a PCN reaction occurring within the last 10 years: Yes If all of the above answers are "NO", then may  proceed with Cephalosporin use.   . Tape Other (See Comments)    NO PAPER TAPE-MUST BE MEDICAL TAPE - unknown reaction  . Wool Alcohol [Lanolin] Hives  . Amoxicillin Rash  . Ampicillin Itching and Rash     Social History   Social History  . Marital status: Married    Spouse name: [name]  . Number of children: 4  . Years of education: 12   Occupational History  . disabled    Social History Main Topics  . Smoking status: Former Smoker    Packs/day: 1.00     Years: 7.00    Types: Cigarettes    Quit date: 03/16/1969  . Smokeless tobacco: Never Used  . Alcohol use No  . Drug use: No  . Sexual activity: Not Currently    Birth control/ protection: Post-menopausal   Other Topics Concern  . Not on file   Social History Narrative   Lives with husband [name] lives with them as do his 2 children   Daughter, [name], and her 3 children live in [place] -  [name] is incarcerated   Nephew, Terrah Hogeland lives with them   Daughter, [name], in [facility] [name] [address]   Moved June 2013 to a better home on [facility].      Lives with husband, son, grandson, granddaughter, nephew.    Physical Exam  Constitutional: She is oriented to person, place, and time.  Neck: Normal range of motion.  Pulmonary/Chest: Effort normal and breath sounds normal. No respiratory distress. She has no wheezes. She has no rales.  Abdominal: Soft.  Musculoskeletal: She exhibits edema.  Swelling to her legs up to her knees   Neurological: She is oriented to person, place, and time.  Skin: Skin is warm and dry.  Psychiatric: She has a normal mood and affect.        Future Appointments Date Time Provider Department Center  04/16/2017 1:40 PM Dolores Patty, MD [facility] None  05/29/2017 11:00 AM [name], MD [name]   BP 102/70   Pulse 76   Resp 16   Wt 187 lb 3.2 oz (84.9 kg)   SpO2 98%   BMI 36.56 kg/m  Weight yesterday-not sure  Last visit weight-198  Pt reports she is doing well, her weight is down from last week, however she still has the unna boots on and her legs are swollen up to her knees, pt denies sob, still not using her 02 due to it tripping the breaker and blowing fuse when plugged in.  meds verified and pill box refilled. No missed doses this week.  No dizziness, no h/a.   ACTION: Home visit completed Next visit planned for next week    Kerry Hough,  EMT-Paramedic  03/06/17

## 2017-03-07 ENCOUNTER — Encounter: Payer: Self-pay | Admitting: *Deleted

## 2017-03-10 ENCOUNTER — Encounter: Payer: Self-pay | Admitting: *Deleted

## 2017-03-10 ENCOUNTER — Other Ambulatory Visit: Payer: Self-pay | Admitting: *Deleted

## 2017-03-10 NOTE — Patient Outreach (Signed)
[facility]) Care Management  03/10/2017  Misty Gray 1951-04-08 [id number]   CSW was able to make initial contact with patient today to perform phone assessment, as well as assess and assist with social work needs and services.  CSW introduced self, explained role and types of services provided through [employer] Care Management [facility] Care Management).  CSW further explained to patient that CSW works with patient's RNCM, also with [facility] Care Management, Kemper Durie. CSW then explained the reason for the call, indicating that Mrs. Misty Gray thought that patient would benefit from social work services and resources to assist with referrals to community agencies and resources that are able to offer financial assistance, as well as a referral to [employer] for home repairs/modifications.  CSW obtained two HIPAA compliant identifiers from patient, which included patient's name and date of birth. Patient admits that she, her husband, Misty Gray, her son, Misty Gray, her nephew and her son's two children, are all currently living under one roof, in a two-bedroom house.  Patient indicated that the bathroom needs repairs and that the electrical system needs to be rewired because they are constantly blowing fuses.  With six people living in the home, it is making it difficult for patient and Misty Gray to pay their electricity bill each month, as they are currently on a fixed income.  Patient reported that her son and her nephew currently receive Disability checks, but are not contributing toward the rent and utilities.  Patient is fearful that her landlord will find out that there are 6 people living in the home, as the home was only approved for patient and Misty Gray. CSW agreed to make a referral for patient to [employer] regarding home repairs/modifications, as well as mail patient a packet of resource information, including all of the  following: [employer] Assistance CSW will follow-up with patient in one week to ensure that patient received the packet of information CSW mailed to patient's home, as well as answer any questions she may have at that time.  Patient voiced understanding and was agreeable to this plan. Danford Bad, BSW, MSW, LCSW  Licensed Restaurant manager, [employer] food [employer] [facility]  Mailing [place] [address] [place], [place] [address] Physical Address-[address], [place], [place] [address] Toll Free Main # [phone] Fax # [phone] Cell # [phone]  Office # [phone] Mardene [email or web address]

## 2017-03-10 NOTE — Patient Outreach (Signed)
[facility]) Care Management  03/10/2017  GABBY SHELMAN 07/26/1951 [id number]   Mailed community resources, [employer], a Museum/gallery conservator. per Danford Bad, LCSW

## 2017-03-12 ENCOUNTER — Other Ambulatory Visit (HOSPITAL_COMMUNITY): Payer: Self-pay

## 2017-03-12 NOTE — Progress Notes (Signed)
Paramedicine Encounter    Patient ID: Misty Gray, female    DOB: [birth year], 66 y.o.   MRN: [id number]   Patient Care Team: Misty Rising, MD as PCP - General (Family Medicine) Misty Range, MD (Cardiology) Misty Durie, RN as [facility] Misty Stamps, LCSW as [facility]  Patient Active Problem List   Diagnosis Date Noted  . Chronic venous stasis dermatitis 12/26/2016  . Urinary tract infection without hematuria   . Frequent PVCs   . Hypokalemia   . Somnolence   . Altered mental status 11/28/2016  . Housing problems 11/23/2016  . Chronic atrial fibrillation (HCC) 11/01/2016  . Hypotension 10/31/2016  . Special screening for malignant neoplasms, colon 08/12/2016  . Heme positive stool 08/09/2016  . Intertrigo 08/09/2016  . Chronic anticoagulation 04/26/2016  . Chronic venous insufficiency   . Preventative health care 10/05/2014  . Breast mass, left 10/05/2014  . Osteopenia 09/15/2014  . Hypertension 01/04/2014  . Chronic systolic dysfunction of left ventricle 05/05/2013  . At high risk for falls 02/11/2013  . Vitamin D deficiency 07/15/2012  . Fatigue 06/22/2012  . Sinoatrial node dysfunction (HCC) 05/08/2011  . Allergic rhinitis 05/31/2008  . Morbid obesity (HCC) 02/12/2007  . GASTROESOPHAGEAL REFLUX, NO ESOPHAGITIS 02/12/2007  . Female stress incontinence 02/12/2007  . Seizure disorder (HCC) 02/12/2007  . OSA (obstructive sleep apnea) 02/12/2007    Current Outpatient Prescriptions:  .  amiodarone (PACERONE) 200 MG tablet, Take 1 tablet (200 mg total) by mouth daily., Disp: 60 tablet, Rfl: 5 .  cholecalciferol (VITAMIN D) 1000 units tablet, Take 1,000 Units by mouth daily., Disp: , Rfl:  .  fluticasone (FLONASE) 50 MCG/ACT nasal spray, Place 2 sprays into both nostrils daily as needed. For congestion., Disp: 16 g, Rfl: 1 .  omeprazole (PRILOSEC) 20 MG capsule, TAKE ONE CAPSULE BY MOUTH EVERY DAY, Disp: 90  capsule, Rfl: 1 .  PHENobarbital (LUMINAL) 32.4 MG tablet, Take 2 tablets (64.8 mg total) by mouth 2 (two) times daily., Disp: 120 tablet, Rfl: 5 .  potassium chloride (K-DUR,KLOR-CON) 10 MEQ tablet, Take 4 tablets (40 mEq total) by mouth 2 (two) times daily., Disp: 240 tablet, Rfl: 3 .  torsemide (DEMADEX) 20 MG tablet, Take 2 tablets by mouth in AM and 1 tablet by mouth in PM, Disp: , Rfl:  .  torsemide (DEMADEX) 20 MG tablet, Take 1-2 tablets (20-40 mg total) by mouth as directed. Take 40 mg in the AM and 20 mg in the PM, Disp: 90 tablet, Rfl: 6 .  warfarin (COUMADIN) 5 MG tablet, 2 tablets (10 mg) on Monday and Thursday;  1 tablet (5 mg) all other days, Disp: 100 tablet, Rfl: 1 .  metolazone (ZAROXOLYN) 2.5 MG tablet, Take 2.5 mg by mouth as needed (weight gain or shortness of breath). Take as directed, Disp: , Rfl:  .  OXYGEN, Inhale 2 L into the lungs at bedtime. cpap machine, Disp: , Rfl:  Allergies  Allergen Reactions  . Aspirin Hives and Rash  . Penicillins Rash and Other (See Comments)    Amoxicillin also, Has patient had a PCN reaction causing immediate rash, facial/tongue/throat swelling, SOB or lightheadedness with hypotension: Yes Has patient had a PCN reaction causing severe rash involving mucus membranes or skin necrosis: No Has patient had a PCN reaction that required hospitalization No Has patient had a PCN reaction occurring within the last 10 years: Yes If all of the above answers are "NO", then may  proceed with Cephalosporin use.   . Tape Other (See Comments)    NO PAPER TAPE-MUST BE MEDICAL TAPE - unknown reaction  . Wool Alcohol [Lanolin] Hives  . Amoxicillin Rash  . Ampicillin Itching and Rash     Social History   Social History  . Marital status: Married    Spouse name: [place]y Gray  . Number of children: 4  . Years of education: 12   Occupational History  . disabled    Social History Main Topics  . Smoking status: Former Smoker    Packs/day: 1.00     Years: 7.00    Types: Cigarettes    Quit date: 03/16/1969  . Smokeless tobacco: Never Used  . Alcohol use No  . Drug use: No  . Sexual activity: Not Currently    Birth control/ protection: Post-menopausal   Other Topics Concern  . Not on file   Social History Narrative   Lives with husband [place]y [place] - [place]y [place] lives with them as do his 2 children   Daughter, [place]y [place], and her 3 children live in [place] -  [place] is incarcerated   Nephew, [place]y [place] lives with them   Daughter, [place], in [name] [address]   Moved June 2013 to a better home on [facility].      Lives with husband, son, grandson, granddaughter, nephew.    Physical Exam  Constitutional: She is oriented to person, place, and time.  Pulmonary/Chest: Effort normal and breath sounds normal. No respiratory distress. She has no wheezes. She has no rales.  Abdominal: Soft.  Musculoskeletal: Normal Gray of motion. She exhibits no edema.  Neurological: She is oriented to person, place, and time.  Skin: Skin is warm and dry.  Psychiatric: She has a normal mood and affect.        Future Appointments Date Time Provider Department Gray  03/17/2017 11:00 AM Fanny Dance Saporito, LCSW [facility] None  03/18/2017 10:45 AM Cammy Copa Shelbie Hutching, MD [facility]  04/16/2017 1:40 PM Dolores Patty, MD [facility] None  05/29/2017 11:00 AM [place]y Range, MD [name]   BP 98/64   Pulse (!) 54   Resp 16   Wt 179 lb 6.4 oz (81.4 kg)   SpO2 98%   BMI 35.04 kg/m  Weight yesterday-181.4 Last visit weight-187  Pt reports she is feeling good. Her weight is showing is down 8lbs-not sure if the scales are correct?? It does however correlate with yesterdays weights. She reports her breathing is doing good-she did run out of 02-it got filled on  Monday this week but its empty now-she cant use the concentrator as it flips the breaker every time its plugged in.  She states  she is bored and sleeps a lot due to her not having anything to do. She enjoys doing church activities however she is much further away now from her home church. Will look into senior resources and see what can be done to help with that.  They have been asking the landlord about fixing the electrical so the 02 doesn't continue to flip the breakers--She isnt using the cpap as the mask is too uncomfortable. She has been doing leg exercises at home.  Need to look into getting her a short step stool of some sort to help her get up in the bed.   meds verified and pill box refilled. She has taken all of her meds this week.  ACTION: Home visit completed Next visit planned for next week  Kerry Hough, EMT-Paramedic 03/12/17

## 2017-03-17 ENCOUNTER — Ambulatory Visit: Payer: Medicare HMO | Admitting: Family Medicine

## 2017-03-17 ENCOUNTER — Other Ambulatory Visit: Payer: Self-pay | Admitting: *Deleted

## 2017-03-17 NOTE — Patient Outreach (Signed)
[facility]) Care Management  03/17/2017  ANYLIA MADSON December 15, 1951 [id number]  CSW was able to make contact with Marijo File, Representative with [employer] regarding referral made for patient.  Mrs. Hunter-Hopkins obtained basic demographic information on patient; however, was unable to pull up patient's address in the system.  Mrs. Hunter-Hopkins encouraged CSW to contact patient to confirm address, as well as establish whether or not the property was "heir property", and in fact, located in [place].  CSW agreed to contact patient to obtain this information and then report findings to Mrs. Hunter-Hopkins. CSW was able to make contact with patient today to follow-up regarding social work services and resources, as well as to check the status of the referral made by CSW to [employer] for various home repairs.  Patient admitted that the address listed in patient's EMR (Electronic Medical Record) in [place] is correct and that the [place] of residence is [place].  Patient went on to say that the property is new, located off of [facility], but that she and her husband do not own the property, they are only renting.  CSW inquired as to whether or not patient's landlord is aware of patient's request for home repairs.  Patient denied.  CSW explained to patient that CSW does not know if [employer] will be able to assist with the home repairs, if patient does not own the home.  Further, CSW encouraged patient to discuss the needed repairs with her landlord, as they are responsible for making the repairs, but unable to do so if they are unaware of the needed repairs.  Patient voiced understanding and agreed to contact her landlord today. In the meantime, CSW contacted Mrs. Hunter-Hopkins, but received voicemail.  A HIPAA compliant message was left on voicemail and CSW is currently awaiting a return call.  CSW left a detailed  message, explaining all of the above information.  CSW will make another outreach attempt in one week, if CSW does not receive a return call from patient in the meantime. Danford Bad, BSW, MSW, LCSW  Licensed Restaurant manager, fast food [facility]  Mailing [place] [address] [place], [place] [address] Physical Address-[address], [place], [place] [address] Toll Free Main # [phone] Fax # [phone] Cell # [phone]  Office # [phone] [email or web address]

## 2017-03-17 NOTE — Patient Outreach (Signed)
[facility]) Care Management  03/17/2017  [place]isty Gray 04/15/1951 [id number]   Call placed to member to follow up on current health status and to schedule home visit.  She report that she is doing well with the exception of a rash under her breasts. She state that she called Katie, the paramedic, to report the rash and was instructed to discuss with her primary tomorrow during her visit.  She denies any concerns at this time, home visit scheduled for next week.  Kemper Durie, [place], MSN [facility] Care Management  [facility] Manager [phone]

## 2017-03-18 ENCOUNTER — Encounter: Payer: Self-pay | Admitting: Internal Medicine

## 2017-03-18 ENCOUNTER — Ambulatory Visit (INDEPENDENT_AMBULATORY_CARE_PROVIDER_SITE_OTHER): Payer: Medicare HMO | Admitting: Internal Medicine

## 2017-03-18 DIAGNOSIS — I872 Venous insufficiency (chronic) (peripheral): Secondary | ICD-10-CM | POA: Diagnosis not present

## 2017-03-18 LAB — POCT INR: INR: 3

## 2017-03-18 NOTE — Assessment & Plan Note (Addendum)
Patient reports improved from last visit. Per physical exam at last visit, symptoms seem slightly improved or at least not worse. No SOB and lungs CTAB or signs of fluid overload otherwise. As such, will not replace Unna boots at this time. Encouraged continued leg elevation. Stressed importance of taking medications every day on time. Patient has f/u with Dr. Gala Romney in one month.

## 2017-03-18 NOTE — Progress Notes (Signed)
   Subjective:   Patient: [place]y Gray       Birthdate: [birth year]       MRN: [id number]      HPI  Misty Gray is a 66 y.o. female presenting for LE edema evaluation.   LE edema due to chronic venous insufficiency Patient has been wearing Unna boots due to recently increased LE edema. Has worn in past, but most recently started wearing on 2/23. She has returned to [facility] twice since then for reevaluation, most recently on 3/16. Patient present today to determine whether she needs to continue wearing Unna boots or not.  Patient reports that she removed the boots herself two days ago because she felt they were too tight. She thinks her swelling has improved since her last visit. She has been keeping her legs elevated as much as possible. She still reports that she sometimes forget to take her medication, usually because she is asleep. She has alarms set on her phone to remind her to take her medications, but she often sleeps through the alarms. She takes her medication as soon as she remembers as long as it is not too soon to her next scheduled dose. Patient did take medications today. Denies trouble walking due to leg swelling or pain. Says that legs are less painful than at last visit. She is adhering to the 2L per day water restriction prescribed by Dr. Gala Romney. She has had compression stockings in the past but says they were painful and not particularly helpful. Patient has lost 2 pounds since last visit.   Smoking status reviewed. Patient is former smoker.   Review of Systems See HPI.     Objective:  Physical Exam  Constitutional: She is oriented to person, place, and time and well-developed, well-nourished, and in no distress.  HENT:  Head: Normocephalic and atraumatic.  Eyes: Conjunctivae and EOM are normal. Right eye exhibits no discharge. Left eye exhibits no discharge.  Cardiovascular: Normal rate, regular rhythm and normal heart sounds.   No murmur heard. Pulmonary/Chest: Effort  normal and breath sounds normal. No respiratory distress. She has no wheezes.  Musculoskeletal:  1-2+ pitting edema to knees bilaterally. Skin changes consistent with chronic venous insufficiency present bilaterally. No TTP to palpation.   Neurological: She is alert and oriented to person, place, and time.  Able to walk slowly but well with cane.   Skin: Skin is warm and dry.  Psychiatric: Affect and judgment normal.      Assessment & Plan:  Chronic venous stasis dermatitis Patient reports improved from last visit. Per physical exam at last visit, symptoms seem slightly improved or at least not worse. No SOB and lungs CTAB or signs of fluid overload otherwise. As such, will not replace Unna boots at this time. Encouraged continued leg elevation. Stressed importance of taking medications every day on time. Patient has f/u with Dr. Gala Romney in one month.    Tarri Abernethy, MD, MPH PGY-2 [facility] Pager [phone]

## 2017-03-18 NOTE — Patient Instructions (Addendum)
It was nice meeting you today Ms. Bognar!  Please continue to keep your legs elevated as much as possible. It is VERY IMPORTANT to take your medications ON TIME EVERY DAY. If you miss a dose, be sure to take it as soon as you remember. Continue the 2 liter fluid restriction as you have been.   Please make sure you follow up with your heart failure doctor as planned.   If you have any questions or concerns, please feel free to call the clinic.   Be well,  Dr. Natale Milch

## 2017-03-19 ENCOUNTER — Telehealth (HOSPITAL_COMMUNITY): Payer: Self-pay | Admitting: *Deleted

## 2017-03-19 ENCOUNTER — Other Ambulatory Visit (HOSPITAL_COMMUNITY): Payer: Self-pay

## 2017-03-19 NOTE — Progress Notes (Signed)
Paramedicine Encounter    Patient ID: Misty Gray, female    DOB: [birth year], 66 y.o.   MRN: [id number]   Patient Care Team: Uvaldo Rising, MD as PCP - General (Family Medicine) [name], MD (Cardiology) Kemper Durie, RN as [facility] Management Karolee Stamps, LCSW as [facility]  Patient Active Problem List   Diagnosis Date Noted  . Chronic venous stasis dermatitis 12/26/2016  . Urinary tract infection without hematuria   . Frequent PVCs   . Hypokalemia   . Somnolence   . Altered mental status 11/28/2016  . Housing problems 11/23/2016  . Chronic atrial fibrillation (HCC) 11/01/2016  . Hypotension 10/31/2016  . Special screening for malignant neoplasms, colon 08/12/2016  . Heme positive stool 08/09/2016  . Intertrigo 08/09/2016  . Chronic anticoagulation 04/26/2016  . Chronic venous insufficiency   . Preventative health care 10/05/2014  . Breast mass, left 10/05/2014  . Osteopenia 09/15/2014  . Hypertension 01/04/2014  . Chronic systolic dysfunction of left ventricle 05/05/2013  . At high risk for falls 02/11/2013  . Vitamin D deficiency 07/15/2012  . Fatigue 06/22/2012  . Sinoatrial node dysfunction (HCC) 05/08/2011  . Allergic rhinitis 05/31/2008  . Morbid obesity (HCC) 02/12/2007  . GASTROESOPHAGEAL REFLUX, NO ESOPHAGITIS 02/12/2007  . Female stress incontinence 02/12/2007  . Seizure disorder (HCC) 02/12/2007  . OSA (obstructive sleep apnea) 02/12/2007    Current Outpatient Prescriptions:  .  amiodarone (PACERONE) 200 MG tablet, Take 1 tablet (200 mg total) by mouth daily., Disp: 60 tablet, Rfl: 5 .  cholecalciferol (VITAMIN D) 1000 units tablet, Take 1,000 Units by mouth daily., Disp: , Rfl:  .  fluticasone (FLONASE) 50 MCG/ACT nasal spray, Place 2 sprays into both nostrils daily as needed. For congestion., Disp: 16 g, Rfl: 1 .  omeprazole (PRILOSEC) 20 MG capsule, TAKE ONE CAPSULE BY MOUTH EVERY DAY, Disp: 90  capsule, Rfl: 1 .  PHENobarbital (LUMINAL) 32.4 MG tablet, Take 2 tablets (64.8 mg total) by mouth 2 (two) times daily., Disp: 120 tablet, Rfl: 5 .  potassium chloride (K-DUR,KLOR-CON) 10 MEQ tablet, Take 4 tablets (40 mEq total) by mouth 2 (two) times daily., Disp: 240 tablet, Rfl: 3 .  torsemide (DEMADEX) 20 MG tablet, Take 2 tablets by mouth in AM and 1 tablet by mouth in PM, Disp: , Rfl:  .  torsemide (DEMADEX) 20 MG tablet, Take 1-2 tablets (20-40 mg total) by mouth as directed. Take 40 mg in the AM and 20 mg in the PM, Disp: 90 tablet, Rfl: 6 .  warfarin (COUMADIN) 5 MG tablet, 2 tablets (10 mg) on Monday and Thursday;  1 tablet (5 mg) all other days, Disp: 100 tablet, Rfl: 1 .  metolazone (ZAROXOLYN) 2.5 MG tablet, Take 2.5 mg by mouth as needed (weight gain or shortness of breath). Take as directed, Disp: , Rfl:  .  OXYGEN, Inhale 2 L into the lungs at bedtime. cpap machine, Disp: , Rfl:  Allergies  Allergen Reactions  . Aspirin Hives and Rash  . Penicillins Rash and Other (See Comments)    Amoxicillin also, Has patient had a PCN reaction causing immediate rash, facial/tongue/throat swelling, SOB or lightheadedness with hypotension: Yes Has patient had a PCN reaction causing severe rash involving mucus membranes or skin necrosis: No Has patient had a PCN reaction that required hospitalization No Has patient had a PCN reaction occurring within the last 10 years: Yes If all of the above answers are "NO", then may  proceed with Cephalosporin use.   . Tape Other (See Comments)    NO PAPER TAPE-MUST BE MEDICAL TAPE - unknown reaction  . Wool Alcohol [Lanolin] Hives  . Amoxicillin Rash  . Ampicillin Itching and Rash     Social History   Social History  . Marital status: Married    Spouse name: [name]  . Number of children: 4  . Years of education: 12   Occupational History  . disabled    Social History Main Topics  . Smoking status: Former Smoker    Packs/day: 1.00     Years: 7.00    Types: Cigarettes    Quit date: 03/16/1969  . Smokeless tobacco: Never Used  . Alcohol use No  . Drug use: No  . Sexual activity: Not Currently    Birth control/ protection: Post-menopausal   Other Topics Concern  . Not on file   Social History Narrative   Lives with husband [name] lives with them as do his 2 children   Daughter, [name], and her 3 children live in [place] -  [name] is incarcerated   Nephew, Graciela Tichy lives with them   Daughter, [name], in [place] [name] [address]   Moved June 2013 to a better home on [facility].      Lives with husband, son, grandson, granddaughter, nephew.    Physical Exam      Future Appointments Date Time Provider Department Center  03/25/2017 9:00 AM Fanny Dance Saporito, LCSW [facility] None  03/25/2017 11:00 AM Kemper Durie, RN [facility] None  04/01/2017 2:00 PM [facility]  04/16/2017 1:40 PM Dolores Patty, MD [facility] None  05/29/2017 11:00 AM [name], MD [name]   BP 94/70   Pulse (!) 54   Wt 174 lb (78.9 kg)   SpO2 98%   BMI 33.98 kg/m  Weight yesterday-177 Last visit weight-179  Pt reports that she is doing ok, contacted monica with [facility] to see about getting her a step stool to assist her with getting on the bed.  She has 2 days left of her coumadin, she has it for today and Thursday. she reports that she doesn't have the money. She moved her meds to [facility] closer to her on [address]-called them to see how much her meds are-meds verified and pill box refilled. They still had her meds at the [facility] pharmacy, so she is able to move it to the [address] one and it will be 2 hrs before its ready- I will be getting it for her and bringing it to her to place in her box.  She denies any dizziness. No h/a. She is maintaining the low sodium diet.    ACTION: Home visit completed Next visit planned for will get her  coumadin today at pharmacy so it will be in for all week.   Kerry Hough, EMT-Paramedic 03/19/17

## 2017-03-19 NOTE — Progress Notes (Signed)
Paramedicine Encounter   Patient ID: [place] , female,   DOB: 1951-01-06,65 y.o.,  MRN: [id number]   Follow up with pt for med rec-went to pharmacy to get her coumadin to place in pill box.  Kerry Hough, EMT-Paramedic 03/19/2017   ACTION: Home visit completed Next visit planned for next week

## 2017-03-19 NOTE — Telephone Encounter (Signed)
Katie called in regarding patient's weight.  Stated she had dropped down to 174lb from 198lbs a few weeks ago but last week on her visit patient was 177 lbs. She also reported patient's blood pressure is 104/70, leg edema has improved, and no longer has the una boots on.    Patient has recently moved and able to obtain accurate daily weights where she was unable to do before.  Patient has been educated on proper daily weights and [name] will continue to follow patient.

## 2017-03-25 ENCOUNTER — Other Ambulatory Visit: Payer: Self-pay | Admitting: *Deleted

## 2017-03-25 NOTE — Patient Outreach (Signed)
[facility]) Care Management  03/25/2017  SHONICE CURLING 15-May-1951 [id number]   Call placed to member prior to going to member's home for home visit as member recently relocated, no answer.  HIPAA compliant voice message left.  This care manager proceeded with home visit, however unable to find home.  Call placed to member again to confirm address, no answer.  HIPAA compliant voice message left.  Call then placed to K. Lynch, Paramedic, to verify address.  Notified that member's address is [address], listed in chart as [phone], will update.    Proceeded to home, no answer after multiple knocks on door.  Call placed again to member, no answer, HIPAA compliant voice message left.  Will await call back, will reschedule home visit at that time.  Kemper Durie, [place], MSN [facility] Care Management  [facility] Manager [phone]

## 2017-03-25 NOTE — Patient Outreach (Signed)
[facility]) Care Management  03/25/2017  PERL ESPER 1951/04/02 [id number]   CSW was able to make contact with patient today to follow-up regarding social work services and resources.  Patient admits that her landlord has still not taken the initiative to fix the electrical problems in her home.  Due to patient constantly blowing fuses, she has been unable to utilize her oxygen since she moved into the rental property.  CSW agreed to contact patient's landlord to discuss patient's concerns, as well as validate how detrimental it is for patient to be able to utilize her oxygen.  Patient was unable to provide CSW with the contact name and number, encouraging CSW to speak directly with her son when he wakes up.  CSW provided patient with CSW's contact information, encouraging patient to have her son contact CSW so that we may discuss the situation further. Patient is aware that she will not be receiving financial assistance through [employer] for the repairs to her home, as she does not own the home.  Patient indicated that a family member is in the process of building her a ramp so that she can move more easily and safely throughout her home.  In addition, patient reported that she has been able to obtain a very sturdy step stool so that she can safely climb in and out of her bed.  CSW will await a return call from patient's son. Danford Bad, BSW, MSW, LCSW  Licensed Restaurant manager, fast food [facility]  Mailing [place] [address], [place], [place] [address] Physical Address-[address], [place], [place] [address] Toll Free Main # [phone] Fax # [phone] Cell # [phone]  Office # [phone] [email or web address]

## 2017-03-26 ENCOUNTER — Other Ambulatory Visit (HOSPITAL_COMMUNITY): Payer: Self-pay

## 2017-03-26 NOTE — Progress Notes (Signed)
Paramedicine Encounter    Patient ID: Misty Gray, female    DOB: 03/04/1951, 66 y.o.   MRN: [id number]   Patient Care Team: Uvaldo Rising, MD as PCP - General (Family Medicine) [name], MD (Cardiology) Kemper Durie, RN as [facility] Care Management  Patient Active Problem List   Diagnosis Date Noted  . Chronic venous stasis dermatitis 12/26/2016  . Urinary tract infection without hematuria   . Frequent PVCs   . Hypokalemia   . Somnolence   . Altered mental status 11/28/2016  . Housing problems 11/23/2016  . Chronic atrial fibrillation (HCC) 11/01/2016  . Hypotension 10/31/2016  . Special screening for malignant neoplasms, colon 08/12/2016  . Heme positive stool 08/09/2016  . Intertrigo 08/09/2016  . Chronic anticoagulation 04/26/2016  . Chronic venous insufficiency   . Preventative health care 10/05/2014  . Breast mass, left 10/05/2014  . Osteopenia 09/15/2014  . Hypertension 01/04/2014  . Chronic systolic dysfunction of left ventricle 05/05/2013  . At high risk for falls 02/11/2013  . Vitamin D deficiency 07/15/2012  . Fatigue 06/22/2012  . Sinoatrial node dysfunction (HCC) 05/08/2011  . Allergic rhinitis 05/31/2008  . Morbid obesity (HCC) 02/12/2007  . GASTROESOPHAGEAL REFLUX, NO ESOPHAGITIS 02/12/2007  . Female stress incontinence 02/12/2007  . Seizure disorder (HCC) 02/12/2007  . OSA (obstructive sleep apnea) 02/12/2007    Current Outpatient Prescriptions:  .  amiodarone (PACERONE) 200 MG tablet, Take 1 tablet (200 mg total) by mouth daily., Disp: 60 tablet, Rfl: 5 .  cholecalciferol (VITAMIN D) 1000 units tablet, Take 1,000 Units by mouth daily., Disp: , Rfl:  .  fluticasone (FLONASE) 50 MCG/ACT nasal spray, Place 2 sprays into both nostrils daily as needed. For congestion., Disp: 16 g, Rfl: 1 .  omeprazole (PRILOSEC) 20 MG capsule, TAKE ONE CAPSULE BY MOUTH EVERY DAY, Disp: 90 capsule, Rfl: 1 .  PHENobarbital (LUMINAL) 32.4 MG tablet, Take 2  tablets (64.8 mg total) by mouth 2 (two) times daily., Disp: 120 tablet, Rfl: 5 .  potassium chloride (K-DUR,KLOR-CON) 10 MEQ tablet, Take 4 tablets (40 mEq total) by mouth 2 (two) times daily., Disp: 240 tablet, Rfl: 3 .  torsemide (DEMADEX) 20 MG tablet, Take 2 tablets by mouth in AM and 1 tablet by mouth in PM, Disp: , Rfl:  .  torsemide (DEMADEX) 20 MG tablet, Take 1-2 tablets (20-40 mg total) by mouth as directed. Take 40 mg in the AM and 20 mg in the PM, Disp: 90 tablet, Rfl: 6 .  warfarin (COUMADIN) 5 MG tablet, 2 tablets (10 mg) on Monday and Thursday;  1 tablet (5 mg) all other days, Disp: 100 tablet, Rfl: 1 .  metolazone (ZAROXOLYN) 2.5 MG tablet, Take 2.5 mg by mouth as needed (weight gain or shortness of breath). Take as directed, Disp: , Rfl:  .  OXYGEN, Inhale 2 L into the lungs at bedtime. cpap machine, Disp: , Rfl:  Allergies  Allergen Reactions  . Aspirin Hives and Rash  . Penicillins Rash and Other (See Comments)    Amoxicillin also, Has patient had a PCN reaction causing immediate rash, facial/tongue/throat swelling, SOB or lightheadedness with hypotension: Yes Has patient had a PCN reaction causing severe rash involving mucus membranes or skin necrosis: No Has patient had a PCN reaction that required hospitalization No Has patient had a PCN reaction occurring within the last 10 years: Yes If all of the above answers are "NO", then may proceed with Cephalosporin use.   . Tape Other (See  Comments)    NO PAPER TAPE-MUST BE MEDICAL TAPE - unknown reaction  . Wool Alcohol [Lanolin] Hives  . Amoxicillin Rash  . Ampicillin Itching and Rash     Social History   Social History  . Marital status: Married    Spouse name: [name]  . Number of children: 4  . Years of education: 12   Occupational History  . disabled    Social History Main Topics  . Smoking status: Former Smoker    Packs/day: 1.00    Years: 7.00    Types: Cigarettes    Quit date: 03/16/1969  . Smokeless  tobacco: Never Used  . Alcohol use No  . Drug use: No  . Sexual activity: Not Currently    Birth control/ protection: Post-menopausal   Other Topics Concern  . Not on file   Social History Narrative   Lives with husband [name] lives with them as do his 2 children   Daughter, [name], and her 3 children live in [place] -  [name] is incarcerated   Nephew, Korbin Muessig lives with them   Daughter, [name], in [facility] [name] [address]   Moved June 2013 to a better home on [facility].      Lives with husband, son, grandson, granddaughter, nephew.    Physical Exam      Future Appointments Date Time Provider Department Center  04/01/2017 2:00 PM [facility]  04/16/2017 1:40 PM Dolores Patty, MD [facility] None  05/29/2017 11:00 AM [name], MD [name]   BP 92/60   Pulse 64   Resp 16   Wt 172 lb 12.8 oz (78.4 kg)   SpO2 97%   BMI 33.75 kg/m  Weight yesterday-kg mode? Been fixed Last visit weight-174   Pt reports she is feeling pretty good. She was laying in the bed when I arrived. Weight maintaining. No sob. Taking all her meds.  She does not have the phenobarbitol refill that we called in last week- I will go get it from pharmacy and bring it back to her--minimal swelling to legs-they are much improved. She is elevating her legs while she is sitting around at house.  Went to pharmacy to p/u phenobarbitol and came back directly to place it in pill box.   ACTION: Home visit completed Next visit planned for next week   Kerry Hough, EMT-Paramedic 03/26/17

## 2017-03-27 ENCOUNTER — Other Ambulatory Visit: Payer: Self-pay | Admitting: *Deleted

## 2017-03-27 NOTE — Patient Outreach (Signed)
[facility]) Care Management  03/27/2017  Misty Gray 11-15-1951 [id number]   Call placed to member to follow up on missed appointment.  She report that she was home but was asleep when this care manager arrived at the home.  Home visit rescheduled for within the next 2 weeks.  She denies any urgent concerns at this time, advised to contact with questions.  Kemper Durie, [place], MSN [facility] Care Management  [facility] Manager [phone]

## 2017-04-01 ENCOUNTER — Ambulatory Visit (INDEPENDENT_AMBULATORY_CARE_PROVIDER_SITE_OTHER): Payer: Medicare HMO | Admitting: *Deleted

## 2017-04-01 ENCOUNTER — Ambulatory Visit: Payer: Medicare HMO

## 2017-04-01 DIAGNOSIS — I872 Venous insufficiency (chronic) (peripheral): Secondary | ICD-10-CM | POA: Diagnosis not present

## 2017-04-01 LAB — POCT INR: INR: 3

## 2017-04-02 ENCOUNTER — Other Ambulatory Visit (HOSPITAL_COMMUNITY): Payer: Self-pay

## 2017-04-02 NOTE — Progress Notes (Signed)
Paramedicine Encounter    Patient ID: Misty Gray, female    DOB: 1951/11/28, 66 y.o.   MRN: [id number]   Patient Care Team: Uvaldo Rising, MD as PCP - General (Family Medicine) [name], MD (Cardiology) Kemper Durie, RN as [facility] Care Management  Patient Active Problem List   Diagnosis Date Noted  . Chronic venous stasis dermatitis 12/26/2016  . Urinary tract infection without hematuria   . Frequent PVCs   . Hypokalemia   . Somnolence   . Altered mental status 11/28/2016  . Housing problems 11/23/2016  . Chronic atrial fibrillation (HCC) 11/01/2016  . Hypotension 10/31/2016  . Special screening for malignant neoplasms, colon 08/12/2016  . Heme positive stool 08/09/2016  . Intertrigo 08/09/2016  . Chronic anticoagulation 04/26/2016  . Chronic venous insufficiency   . Preventative health care 10/05/2014  . Breast mass, left 10/05/2014  . Osteopenia 09/15/2014  . Hypertension 01/04/2014  . Chronic systolic dysfunction of left ventricle 05/05/2013  . At high risk for falls 02/11/2013  . Vitamin D deficiency 07/15/2012  . Fatigue 06/22/2012  . Sinoatrial node dysfunction (HCC) 05/08/2011  . Allergic rhinitis 05/31/2008  . Morbid obesity (HCC) 02/12/2007  . GASTROESOPHAGEAL REFLUX, NO ESOPHAGITIS 02/12/2007  . Female stress incontinence 02/12/2007  . Seizure disorder (HCC) 02/12/2007  . OSA (obstructive sleep apnea) 02/12/2007    Current Outpatient Prescriptions:  .  amiodarone (PACERONE) 200 MG tablet, Take 1 tablet (200 mg total) by mouth daily., Disp: 60 tablet, Rfl: 5 .  fluticasone (FLONASE) 50 MCG/ACT nasal spray, Place 2 sprays into both nostrils daily as needed. For congestion., Disp: 16 g, Rfl: 1 .  omeprazole (PRILOSEC) 20 MG capsule, TAKE ONE CAPSULE BY MOUTH EVERY DAY, Disp: 90 capsule, Rfl: 1 .  PHENobarbital (LUMINAL) 32.4 MG tablet, Take 2 tablets (64.8 mg total) by mouth 2 (two) times daily., Disp: 120 tablet, Rfl: 5 .  potassium  chloride (K-DUR,KLOR-CON) 10 MEQ tablet, Take 4 tablets (40 mEq total) by mouth 2 (two) times daily., Disp: 240 tablet, Rfl: 3 .  torsemide (DEMADEX) 20 MG tablet, Take 2 tablets by mouth in AM and 1 tablet by mouth in PM, Disp: , Rfl:  .  torsemide (DEMADEX) 20 MG tablet, Take 1-2 tablets (20-40 mg total) by mouth as directed. Take 40 mg in the AM and 20 mg in the PM, Disp: 90 tablet, Rfl: 6 .  warfarin (COUMADIN) 5 MG tablet, 2 tablets (10 mg) on Monday and Thursday;  1 tablet (5 mg) all other days, Disp: 100 tablet, Rfl: 1 .  cholecalciferol (VITAMIN D) 1000 units tablet, Take 1,000 Units by mouth daily., Disp: , Rfl:  .  metolazone (ZAROXOLYN) 2.5 MG tablet, Take 2.5 mg by mouth as needed (weight gain or shortness of breath). Take as directed, Disp: , Rfl:  .  OXYGEN, Inhale 2 L into the lungs at bedtime. cpap machine, Disp: , Rfl:  Allergies  Allergen Reactions  . Aspirin Hives and Rash  . Penicillins Rash and Other (See Comments)    Amoxicillin also, Has patient had a PCN reaction causing immediate rash, facial/tongue/throat swelling, SOB or lightheadedness with hypotension: Yes Has patient had a PCN reaction causing severe rash involving mucus membranes or skin necrosis: No Has patient had a PCN reaction that required hospitalization No Has patient had a PCN reaction occurring within the last 10 years: Yes If all of the above answers are "NO", then may proceed with Cephalosporin use.   . Tape Other (See  Comments)    NO PAPER TAPE-MUST BE MEDICAL TAPE - unknown reaction  . Wool Alcohol [Lanolin] Hives  . Amoxicillin Rash  . Ampicillin Itching and Rash     Social History   Social History  . Marital status: Married    Spouse name: [name]  . Number of children: 4  . Years of education: 12   Occupational History  . disabled    Social History Main Topics  . Smoking status: Former Smoker    Packs/day: 1.00    Years: 7.00    Types: Cigarettes    Quit date: 03/16/1969  .  Smokeless tobacco: Never Used  . Alcohol use No  . Drug use: No  . Sexual activity: Not Currently    Birth control/ protection: Post-menopausal   Other Topics Concern  . Not on file   Social History Narrative   Lives with husband [name] lives with them as do his 2 children   Daughter, [name], and her 3 children live in [place] -  [name] is incarcerated   Nephew, Umaiza Wishon lives with them   Daughter, [name], in [facility] [name] [address]   Moved June 2013 to a better home on [facility].      Lives with husband, son, grandson, granddaughter, nephew.    Physical Exam  Pulmonary/Chest: No respiratory distress. She has no wheezes. She has no rales.  Abdominal: There is no guarding.  Musculoskeletal: She exhibits edema.  Skin: Skin is warm and dry. She is not diaphoretic.          ReDS Vest - 04/02/17 1700      ReDS Vest   MR  No   Estimated volume prior to reading Low   Fitting Posture Standing   Height Marker Short   Ruler Value 16.5   Center Strip Aligned   ReDS Value 31      Future Appointments Date Time Provider Department Center  04/07/2017 11:00 AM Kemper Durie, RN [facility] None  04/16/2017 1:40 PM Dolores Patty, MD [facility] None  05/29/2017 11:00 AM [name], MD [name]   BP 94/70   Pulse 64   Resp 16   Wt 170 lb (77.1 kg)   SpO2 98%   BMI 33.20 kg/m  Weight yesterday-172.3 Last visit weight-172  Pt reports she is feeling good, she needs to pick up 2 meds at pharmacy-she is short on torsemide and potassium so I will come back out here Monday to finish up her pill box. She reports her breathing is doing ok. Pt states she has no money yet for the meds, advised her to let me know by  Monday about that. pts husband said they had no money and not even enough for gas in the truck the son has. So I will f/u on Monday. Pt has no complaints. No sob, no h/a, no dizziness.    ACTION: Home visit completed  Kerry Hough, EMT-Paramedic 04/02/17

## 2017-04-07 ENCOUNTER — Other Ambulatory Visit (HOSPITAL_COMMUNITY): Payer: Self-pay

## 2017-04-07 ENCOUNTER — Other Ambulatory Visit: Payer: Self-pay | Admitting: *Deleted

## 2017-04-07 NOTE — Patient Outreach (Signed)
[facility]) Care Management   04/07/2017  Misty Gray [birth year] [id number]  Misty Gray is an 66 y.o. female  Subjective:   Member awake & alert, denies any pain or discomfort, denies shortness of breath.  She report compliance with medications, state she is in need of a refill for Potassium and Torsemide, but unable to afford them.  She state that [employer], Audiological scientist, will obtain them today (took last dose today) and bring them to refill her pill box.  Objective:   Review of Systems  Constitutional: Negative.   HENT: Negative.   Eyes: Negative.   Respiratory: Negative.   Cardiovascular: Negative.   Gastrointestinal: Negative.   Genitourinary: Negative.   Musculoskeletal: Negative.   Skin: Negative.   Neurological: Negative.   Endo/Heme/Allergies: Negative.   Psychiatric/Behavioral: Negative.     Physical Exam  Constitutional: She is oriented to person, place, and time. She appears well-developed and well-nourished.  Neck: Normal range of motion.  Cardiovascular:  Irregular  Respiratory: Effort normal and breath sounds normal.  GI: Soft. Bowel sounds are normal.  Musculoskeletal: Normal range of motion.  Neurological: She is alert and oriented to person, place, and time.  Skin: Skin is warm and dry.   BP 102/72 (BP Location: Left Arm, Patient Position: Sitting, Cuff Size: Normal)   Pulse (!) 55   Resp 18   Ht 1.524 m (5')   Wt 154 lb 6.4 oz (70 kg)   SpO2 97%   BMI 30.15 kg/m   Encounter Medications:   Outpatient Encounter Prescriptions as of 04/07/2017  Medication Sig Note  . amiodarone (PACERONE) 200 MG tablet Take 1 tablet (200 mg total) by mouth daily.   . cholecalciferol (VITAMIN D) 1000 units tablet Take 1,000 Units by mouth daily.   . fluticasone (FLONASE) 50 MCG/ACT nasal spray Place 2 sprays into both nostrils daily as needed. For congestion.   . metolazone (ZAROXOLYN) 2.5 MG tablet Take 2.5 mg by mouth as needed (weight gain or  shortness of breath). Take as directed 02/27/2017: Has not taken this in quite some time per pt--she got it filled on 01-14-17 and hasnt used it yet.   Marland Kitchen omeprazole (PRILOSEC) 20 MG capsule TAKE ONE CAPSULE BY MOUTH EVERY DAY   . PHENobarbital (LUMINAL) 32.4 MG tablet Take 2 tablets (64.8 mg total) by mouth 2 (two) times daily.   . potassium chloride (K-DUR,KLOR-CON) 10 MEQ tablet Take 4 tablets (40 mEq total) by mouth 2 (two) times daily.   Marland Kitchen torsemide (DEMADEX) 20 MG tablet Take 2 tablets by mouth in AM and 1 tablet by mouth in PM   . torsemide (DEMADEX) 20 MG tablet Take 1-2 tablets (20-40 mg total) by mouth as directed. Take 40 mg in the AM and 20 mg in the PM   . warfarin (COUMADIN) 5 MG tablet 2 tablets (10 mg) on Monday and Thursday;  1 tablet (5 mg) all other days 03/06/2017: No missed doses this week  . OXYGEN Inhale 2 L into the lungs at bedtime. cpap machine 02/27/2017: Not able to use-when she plugs in her machine it trips the breaker and blows fuses   No facility-administered encounter medications on file as of 04/07/2017.     Functional Status:   In your present state of health, do you have any difficulty performing the following activities: 03/10/2017 11/28/2016  Hearing? N N  Vision? N N  Difficulty concentrating or making decisions? N N  Walking or climbing stairs? N Y  Dressing  or bathing? N Y  Doing errands, shopping? N Y  Conservation officer, nature and eating ? N -  Using the Toilet? N -  In the past six months, have you accidently leaked urine? Y -  Do you have problems with loss of bowel control? N -  Managing your Medications? N -  Managing your Finances? Y -  Housekeeping or managing your Housekeeping? Y -  Some recent data might be hidden    Fall/Depression Screening:    PHQ 2/9 Scores 03/18/2017 03/10/2017 02/28/2017 02/20/2017 02/07/2017 01/24/2017 12/12/2016  PHQ - 2 Score 0 0 0 0 0 0 0    Assessment:    Met with member at scheduled time.  Per assessment, legs swelling much  improved, weight much improved.  She continues to weigh daily and record.  She denies any shortness of breath, but report that she still has not been able to wear her oxygen at night due to the electrical in the home.  Son and husband state that they have been trying to contact the landlord but have been unsuccessful.    Medications reviewed as member state that she attempted to refill pill box.  Errors in pill box corrected, confirmed with Joellen Jersey that she will provide Torsemide and Potassium today.    Heart failure management and zones reviewed.  Member is able to report when to contact MD and/or Katie with weight gain, swelling, or shortness of breath.  She is able to verbalize proper heart failure diet, state that she has cut out processed foods (name hot dogs particularly) from her diet.    She has met all current goals, however she remains high risk for readmission.  She is aware that goals have been met, but discussion will take place with Katie to determine progress as she does weekly home visits.  She denies any concerns at this time, advised to contact with questions.  Plan:   Will follow up with Joellen Jersey, paramedic, regarding member's progress and need for further involvement with community nurse.  If involvement not needed, will refer to telephonic for disease management. Will follow up with LCSW regarding community resources and housing situation.  [facility] CM Care Plan Problem One     Most Recent Value  Care Plan Problem One  Knowledge deficit related to management of heart failure as evidenced by inadequate follow through of instructions  Role Documenting the Problem One  Care Management [place] for Problem One  Not Active  [facility] Long Term Goal (31-90 days)  Member will be able to verbalize yellow zone of heart failure action plan within the next 31 days  [facility] Long Term Goal Start Date  01/21/17 [Goal not met, date reset]  [facility]N Long Term Goal Met Date  02/18/17  Interventions for  Problem One Long Term Goal  Re-educated member on heart failure zones and when to notify physician.  Member was already provided with [facility] calendar tool with heart failure zones included.  [facility] CM Short Term Goal #1 (0-30 days)  Member will have new scale within the next 2 weeks  [facility] CM Short Term Goal #1 Start Date  01/21/17 [Goal not met, date reset]  [facility] CM Short Term Goal #1 Met Date  02/18/17  Interventions for Short Term Goal #1  Request made to order new scale, provided by [facility], will be delivered to [facility] home.  [facility] CM Short Term Goal #2 (0-30 days)  Member will weigh self daily and record readings over the next 4  weeks  [facility] CM Short Term Goal #2 Start Date  02/18/17 [Goal not met, date reset]  Interventions for Short Term Goal #2  Re-educated on importance of daily weights in effort to manage heart failure.  Member made aware that new scale ordered.    [facility] CM Care Plan Problem Three     Most Recent Value  Care Plan Problem Three  Risk for hospital readmit related to heart failure as evidenced by recent hospitalization  Role Documenting the Problem Three  Care Management [place] for Problem Three  Not Active  THN Long Term Goal (31-90) days  Member will not be readmitted to hospital wihtin the next 31 days of discharge  THN Long Term Goal Start Date  12/10/16  [facility] Long Term Goal Met Date  01/06/17  Interventions for Problem Three Long Term Goal  Discussed with member the importance of following discharge instructions, including follow up appointments, medications, diet, and home health involvement, to decrease the risk of readmission  [facility] CM Short Term Goal #1 (0-30 days)  Member will keep and attend both primary MD and cardiology appointments within the next 2 weeks  [facility] CM Short Term Goal #1 Start Date  12/10/16  [facility] CM Short Term Goal #1 Met Date  12/20/16  Interventions for Short Term Goal #1  Member advised of the importance of attending follow up appointments to  decrease risk of readmission.  Provided  member with date & times of both appointments (12/28 & 1/4)  [facility] CM Short Term Goal #2 (0-30 days)  Member will weigh and record readings daily over the next 4 weeks  [facility] CM Short Term Goal #2 Start Date  01/06/17 [Date reset]  Interventions for Short Term Goal #2  Member re-educated on the importance of daily weights and when to contact MD     Valente David, RN, MSN [place] Manager [phone]

## 2017-04-07 NOTE — Progress Notes (Signed)
Paramedicine Encounter    Patient ID: Misty Gray, female    DOB: 1951/11/28, 66 y.o.   MRN: [id number]   Patient Care Team: Uvaldo Rising, MD as PCP - General (Family Medicine) [name], MD (Cardiology) Kemper Durie, RN as [facility] Care Management  Patient Active Problem List   Diagnosis Date Noted  . Chronic venous stasis dermatitis 12/26/2016  . Urinary tract infection without hematuria   . Frequent PVCs   . Hypokalemia   . Somnolence   . Altered mental status 11/28/2016  . Housing problems 11/23/2016  . Chronic atrial fibrillation (HCC) 11/01/2016  . Hypotension 10/31/2016  . Special screening for malignant neoplasms, colon 08/12/2016  . Heme positive stool 08/09/2016  . Intertrigo 08/09/2016  . Chronic anticoagulation 04/26/2016  . Chronic venous insufficiency   . Preventative health care 10/05/2014  . Breast mass, left 10/05/2014  . Osteopenia 09/15/2014  . Hypertension 01/04/2014  . Chronic systolic dysfunction of left ventricle 05/05/2013  . At high risk for falls 02/11/2013  . Vitamin D deficiency 07/15/2012  . Fatigue 06/22/2012  . Sinoatrial node dysfunction (HCC) 05/08/2011  . Allergic rhinitis 05/31/2008  . Morbid obesity (HCC) 02/12/2007  . GASTROESOPHAGEAL REFLUX, NO ESOPHAGITIS 02/12/2007  . Female stress incontinence 02/12/2007  . Seizure disorder (HCC) 02/12/2007  . OSA (obstructive sleep apnea) 02/12/2007    Current Outpatient Prescriptions:  .  amiodarone (PACERONE) 200 MG tablet, Take 1 tablet (200 mg total) by mouth daily., Disp: 60 tablet, Rfl: 5 .  fluticasone (FLONASE) 50 MCG/ACT nasal spray, Place 2 sprays into both nostrils daily as needed. For congestion., Disp: 16 g, Rfl: 1 .  omeprazole (PRILOSEC) 20 MG capsule, TAKE ONE CAPSULE BY MOUTH EVERY DAY, Disp: 90 capsule, Rfl: 1 .  PHENobarbital (LUMINAL) 32.4 MG tablet, Take 2 tablets (64.8 mg total) by mouth 2 (two) times daily., Disp: 120 tablet, Rfl: 5 .  potassium  chloride (K-DUR,KLOR-CON) 10 MEQ tablet, Take 4 tablets (40 mEq total) by mouth 2 (two) times daily., Disp: 240 tablet, Rfl: 3 .  torsemide (DEMADEX) 20 MG tablet, Take 2 tablets by mouth in AM and 1 tablet by mouth in PM, Disp: , Rfl:  .  torsemide (DEMADEX) 20 MG tablet, Take 1-2 tablets (20-40 mg total) by mouth as directed. Take 40 mg in the AM and 20 mg in the PM, Disp: 90 tablet, Rfl: 6 .  warfarin (COUMADIN) 5 MG tablet, 2 tablets (10 mg) on Monday and Thursday;  1 tablet (5 mg) all other days, Disp: 100 tablet, Rfl: 1 .  cholecalciferol (VITAMIN D) 1000 units tablet, Take 1,000 Units by mouth daily., Disp: , Rfl:  .  metolazone (ZAROXOLYN) 2.5 MG tablet, Take 2.5 mg by mouth as needed (weight gain or shortness of breath). Take as directed, Disp: , Rfl:  .  OXYGEN, Inhale 2 L into the lungs at bedtime. cpap machine, Disp: , Rfl:  Allergies  Allergen Reactions  . Aspirin Hives and Rash  . Penicillins Rash and Other (See Comments)    Amoxicillin also, Has patient had a PCN reaction causing immediate rash, facial/tongue/throat swelling, SOB or lightheadedness with hypotension: Yes Has patient had a PCN reaction causing severe rash involving mucus membranes or skin necrosis: No Has patient had a PCN reaction that required hospitalization No Has patient had a PCN reaction occurring within the last 10 years: Yes If all of the above answers are "NO", then may proceed with Cephalosporin use.   . Tape Other (See  Comments)    NO PAPER TAPE-MUST BE MEDICAL TAPE - unknown reaction  . Wool Alcohol [Lanolin] Hives  . Amoxicillin Rash  . Ampicillin Itching and Rash     Social History   Social History  . Marital status: Married    Spouse name: [name]  . Number of children: 4  . Years of education: 12   Occupational History  . disabled    Social History Main Topics  . Smoking status: Former Smoker    Packs/day: 1.00    Years: 7.00    Types: Cigarettes    Quit date: 03/16/1969  .  Smokeless tobacco: Never Used  . Alcohol use No  . Drug use: No  . Sexual activity: Not Currently    Birth control/ protection: Post-menopausal   Other Topics Concern  . Not on file   Social History Narrative   Lives with husband [name] lives with them as do his 2 children   Daughter, [name], and her 3 children live in [place] -  [name] is incarcerated   Nephew, Doral Digangi lives with them   Daughter, [name], in [facility] [name] [address]   Moved June 2013 to a better home on [facility].      Lives with husband, son, grandson, granddaughter, nephew.    Physical Exam  Constitutional: She is oriented to person, place, and time.  Pulmonary/Chest: Effort normal and breath sounds normal. No respiratory distress. She has no wheezes. She has no rales.  Abdominal: Soft.  Musculoskeletal: She exhibits no edema.  Neurological: She is oriented to person, place, and time.  Skin: Skin is warm and dry.  Psychiatric: She has a normal mood and affect.        Future Appointments Date Time Provider Department Center  04/16/2017 1:40 PM Dolores Patty, MD [facility] None  05/29/2017 11:00 AM [name], MD [name]   BP 98/70   Pulse 62   Resp 15   Wt 166 lb (75.3 kg)   SpO2 95%   BMI 32.42 kg/m  Weight yesterday-??? Last visit weight-170  Pt was just seen today by [facility] nurse, [name]-she filled pill box with the exception of torsemide and potassium and I picked up them for her from pharmacy as she called me this morning stating that she didn't have enough money to get them. Her weights have been in the 150s per pt but I got her to weigh again during our visit and it was 166. Pt denies any sob, no dizziness, no h/a. meds verified and pill box refilled.  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    ACTION: Home visit completed Next visit  planned for next week   Kerry Hough, EMT-Paramedic 04/07/17

## 2017-04-09 ENCOUNTER — Telehealth: Payer: Self-pay

## 2017-04-09 MED ORDER — VITAMIN D 1000 UNITS PO TABS: 1000.0000 [IU] | ORAL_TABLET | Freq: Every day | ORAL | 3 refills | Status: DC

## 2017-04-09 NOTE — Telephone Encounter (Signed)
Refill of Vit D sent to pharmacy.

## 2017-04-09 NOTE — Telephone Encounter (Signed)
Pt calling for refill of Vit D. Sunday Spillers, CMA

## 2017-04-14 ENCOUNTER — Other Ambulatory Visit (HOSPITAL_COMMUNITY): Payer: Self-pay

## 2017-04-14 NOTE — Progress Notes (Signed)
Paramedicine Encounter    Patient ID: Misty Gray, female    DOB: Nov 30, 1951, 66 y.o.   MRN: [id number]   Patient Care Team: Misty Rising, MD as PCP - General (Family Medicine) Misty Range, MD (Cardiology) Misty Durie, RN as [facility] Care Management  Patient Active Problem List   Diagnosis Date Noted  . Chronic venous stasis dermatitis 12/26/2016  . Urinary tract infection without hematuria   . Frequent PVCs   . Hypokalemia   . Somnolence   . Altered mental status 11/28/2016  . Housing problems 11/23/2016  . Chronic atrial fibrillation (HCC) 11/01/2016  . Hypotension 10/31/2016  . Special screening for malignant neoplasms, colon 08/12/2016  . Heme positive stool 08/09/2016  . Intertrigo 08/09/2016  . Chronic anticoagulation 04/26/2016  . Chronic venous insufficiency   . Preventative health care 10/05/2014  . Breast mass, left 10/05/2014  . Osteopenia 09/15/2014  . Hypertension 01/04/2014  . Chronic systolic dysfunction of left ventricle 05/05/2013  . At high risk for falls 02/11/2013  . Vitamin D deficiency 07/15/2012  . Fatigue 06/22/2012  . Sinoatrial node dysfunction (HCC) 05/08/2011  . Allergic rhinitis 05/31/2008  . Morbid obesity (HCC) 02/12/2007  . GASTROESOPHAGEAL REFLUX, NO ESOPHAGITIS 02/12/2007  . Female stress incontinence 02/12/2007  . Seizure disorder (HCC) 02/12/2007  . OSA (obstructive sleep apnea) 02/12/2007    Current Outpatient Prescriptions:  .  amiodarone (PACERONE) 200 MG tablet, Take 1 tablet (200 mg total) by mouth daily., Disp: 60 tablet, Rfl: 5 .  fluticasone (FLONASE) 50 MCG/ACT nasal spray, Place 2 sprays into both nostrils daily as needed. For congestion., Disp: 16 g, Rfl: 1 .  omeprazole (PRILOSEC) 20 MG capsule, TAKE ONE CAPSULE BY MOUTH EVERY DAY, Disp: 90 capsule, Rfl: 1 .  PHENobarbital (LUMINAL) 32.4 MG tablet, Take 2 tablets (64.8 mg total) by mouth 2 (two) times daily., Disp: 120 tablet, Rfl: 5 .  potassium  chloride (K-DUR,KLOR-CON) 10 MEQ tablet, Take 4 tablets (40 mEq total) by mouth 2 (two) times daily., Disp: 240 tablet, Rfl: 3 .  torsemide (DEMADEX) 20 MG tablet, Take 2 tablets by mouth in AM and 1 tablet by mouth in PM, Disp: , Rfl:  .  torsemide (DEMADEX) 20 MG tablet, Take 1-2 tablets (20-40 mg total) by mouth as directed. Take 40 mg in the AM and 20 mg in the PM, Disp: 90 tablet, Rfl: 6 .  warfarin (COUMADIN) 5 MG tablet, 2 tablets (10 mg) on Monday and Thursday;  1 tablet (5 mg) all other days, Disp: 100 tablet, Rfl: 1 .  cholecalciferol (VITAMIN D) 1000 units tablet, Take 1 tablet (1,000 Units total) by mouth daily. (Patient not taking: Reported on 04/14/2017), Disp: 90 tablet, Rfl: 3 .  metolazone (ZAROXOLYN) 2.5 MG tablet, Take 2.5 mg by mouth as needed (weight gain or shortness of breath). Take as directed, Disp: , Rfl:  .  OXYGEN, Inhale 2 L into the lungs at bedtime. cpap machine, Disp: , Rfl:  Allergies  Allergen Reactions  . Aspirin Hives and Rash  . Penicillins Rash and Other (See Comments)    Amoxicillin also, Has patient had a PCN reaction causing immediate rash, facial/tongue/throat swelling, SOB or lightheadedness with hypotension: Yes Has patient had a PCN reaction causing severe rash involving mucus membranes or skin necrosis: No Has patient had a PCN reaction that required hospitalization No Has patient had a PCN reaction occurring within the last 10 years: Yes If all of the above answers are "NO", then may  proceed with Cephalosporin use.   . Tape Other (See Comments)    NO PAPER TAPE-MUST BE MEDICAL TAPE - unknown reaction  . Wool Alcohol [Lanolin] Hives  . Amoxicillin Rash  . Ampicillin Itching and Rash     Social History   Social History  . Marital status: Married    Spouse name: Misty Gray  . Number of children: 4  . Years of education: 12   Occupational History  . disabled    Social History Main Topics  . Smoking status: Former Smoker    Packs/day: 1.00     Years: 7.00    Types: Cigarettes    Quit date: 03/16/1969  . Smokeless tobacco: Never Used  . Alcohol use No  . Drug use: No  . Sexual activity: Not Currently    Birth control/ protection: Post-menopausal   Other Topics Concern  . Not on file   Social History Narrative   Lives with husband Misty Gray - Misty Gray lives with them as do his 2 children   Daughter, Misty Gray, and her 3 children live in [place] -  Misty Gray is incarcerated   Nephew, Misty Gray lives with them   Daughter, Misty Gray, in [facility] [name] [address]   Moved June 2013 to a better home on [facility].      Lives with husband, son, grandson, granddaughter, nephew.    Physical Exam      Future Appointments Date Time Provider Department Center  04/16/2017 1:40 PM Misty Patty, MD [facility] None  05/29/2017 11:00 AM Misty Range, MD [name]   BP 94/60   Pulse 60   Resp 15   Wt 161 lb (73 kg)   SpO2 96%   BMI 31.44 kg/m  Weight yesterday-158.8 Last visit weight-166  Pt reports she is feeling good, she denies any dizziness, no sob, weight is still decreasing. Down 5lbs from last visit.  meds verified and pill box refilled. Still has issues with money-will get paid this week and will be able to get meds after that.    ACTION: Home visit completed Next visit planned for at clinic visit this week   Misty Gray, EMT-Paramedic 04/14/17

## 2017-04-15 DIAGNOSIS — G4733 Obstructive sleep apnea (adult) (pediatric): Secondary | ICD-10-CM | POA: Diagnosis not present

## 2017-04-15 DIAGNOSIS — I279 Pulmonary heart disease, unspecified: Secondary | ICD-10-CM | POA: Diagnosis not present

## 2017-04-15 DIAGNOSIS — R0902 Hypoxemia: Secondary | ICD-10-CM | POA: Diagnosis not present

## 2017-04-15 DIAGNOSIS — Q871 Congenital malformation syndromes predominantly associated with short stature: Secondary | ICD-10-CM | POA: Diagnosis not present

## 2017-04-16 ENCOUNTER — Other Ambulatory Visit (HOSPITAL_COMMUNITY): Payer: Self-pay

## 2017-04-16 ENCOUNTER — Ambulatory Visit (HOSPITAL_COMMUNITY)
Admission: RE | Admit: 2017-04-16 | Discharge: 2017-04-16 | Disposition: A | Discharge status: Home / Self Care | Payer: Medicare HMO | Source: Ambulatory Visit | Attending: Internal Medicine | Admitting: Internal Medicine

## 2017-04-16 ENCOUNTER — Other Ambulatory Visit: Payer: Self-pay | Admitting: Family Medicine

## 2017-04-16 ENCOUNTER — Telehealth (HOSPITAL_COMMUNITY): Payer: Self-pay | Admitting: Pharmacist

## 2017-04-16 ENCOUNTER — Other Ambulatory Visit (HOSPITAL_COMMUNITY): Payer: Self-pay | Admitting: Pharmacist

## 2017-04-16 ENCOUNTER — Encounter (HOSPITAL_COMMUNITY): Payer: Self-pay | Admitting: Internal Medicine

## 2017-04-16 VITALS — BP 104/62 | HR 61 | Wt 162.5 lb

## 2017-04-16 DIAGNOSIS — Z7901 Long term (current) use of anticoagulants: Secondary | ICD-10-CM | POA: Insufficient documentation

## 2017-04-16 DIAGNOSIS — I519 Heart disease, unspecified: Secondary | ICD-10-CM | POA: Diagnosis not present

## 2017-04-16 DIAGNOSIS — Z87891 Personal history of nicotine dependence: Secondary | ICD-10-CM | POA: Diagnosis not present

## 2017-04-16 DIAGNOSIS — I495 Sick sinus syndrome: Secondary | ICD-10-CM | POA: Insufficient documentation

## 2017-04-16 DIAGNOSIS — I482 Chronic atrial fibrillation, unspecified: Secondary | ICD-10-CM

## 2017-04-16 DIAGNOSIS — E7800 Pure hypercholesterolemia, unspecified: Secondary | ICD-10-CM | POA: Diagnosis not present

## 2017-04-16 DIAGNOSIS — I110 Hypertensive heart disease with heart failure: Secondary | ICD-10-CM | POA: Insufficient documentation

## 2017-04-16 DIAGNOSIS — Z9981 Dependence on supplemental oxygen: Secondary | ICD-10-CM | POA: Insufficient documentation

## 2017-04-16 DIAGNOSIS — K449 Diaphragmatic hernia without obstruction or gangrene: Secondary | ICD-10-CM | POA: Insufficient documentation

## 2017-04-16 DIAGNOSIS — Z880 Allergy status to penicillin: Secondary | ICD-10-CM | POA: Diagnosis not present

## 2017-04-16 DIAGNOSIS — Z6831 Body mass index (BMI) 31.0-31.9, adult: Secondary | ICD-10-CM | POA: Diagnosis not present

## 2017-04-16 DIAGNOSIS — Z950 Presence of cardiac pacemaker: Secondary | ICD-10-CM | POA: Insufficient documentation

## 2017-04-16 DIAGNOSIS — I878 Other specified disorders of veins: Secondary | ICD-10-CM | POA: Diagnosis not present

## 2017-04-16 DIAGNOSIS — I5022 Chronic systolic (congestive) heart failure: Secondary | ICD-10-CM | POA: Insufficient documentation

## 2017-04-16 DIAGNOSIS — Z79899 Other long term (current) drug therapy: Secondary | ICD-10-CM | POA: Diagnosis not present

## 2017-04-16 DIAGNOSIS — G4733 Obstructive sleep apnea (adult) (pediatric): Secondary | ICD-10-CM | POA: Insufficient documentation

## 2017-04-16 DIAGNOSIS — Z8673 Personal history of transient ischemic attack (TIA), and cerebral infarction without residual deficits: Secondary | ICD-10-CM | POA: Diagnosis not present

## 2017-04-16 DIAGNOSIS — I429 Cardiomyopathy, unspecified: Secondary | ICD-10-CM | POA: Diagnosis not present

## 2017-04-16 DIAGNOSIS — K219 Gastro-esophageal reflux disease without esophagitis: Secondary | ICD-10-CM | POA: Insufficient documentation

## 2017-04-16 DIAGNOSIS — G40409 Other generalized epilepsy and epileptic syndromes, not intractable, without status epilepticus: Secondary | ICD-10-CM | POA: Insufficient documentation

## 2017-04-16 DIAGNOSIS — I493 Ventricular premature depolarization: Secondary | ICD-10-CM

## 2017-04-16 LAB — BASIC METABOLIC PANEL
Anion gap: 9 (ref 5–15)
BUN: 28 mg/dL — AB (ref 6–20)
CO2: 27 mmol/L (ref 22–32)
CREATININE: 0.9 mg/dL (ref 0.44–1.00)
Calcium: 9.6 mg/dL (ref 8.9–10.3)
Chloride: 101 mmol/L (ref 101–111)
GFR calc Af Amer: >60 mL/min (ref >60)
GFR calc non Af Amer: >60 mL/min (ref >60)
GLUCOSE: 85 mg/dL (ref 65–99)
Potassium: 3.9 mmol/L (ref 3.5–5.1)
Sodium: 137 mmol/L (ref 135–145)

## 2017-04-16 LAB — PROTIME-INR
INR: 2.61
PROTHROMBIN TIME: 28.4 s — AB (ref 11.4–15.2)

## 2017-04-16 MED ORDER — EDOXABAN TOSYLATE 60 MG PO TABS: 60.0000 mg | ORAL_TABLET | Freq: Every day | ORAL | 11 refills | Status: DC

## 2017-04-16 MED ORDER — APIXABAN 5 MG PO TABS: 5.0000 mg | ORAL_TABLET | Freq: Two times a day (BID) | ORAL | 6 refills | Status: DC

## 2017-04-16 MED ORDER — LOSARTAN POTASSIUM 25 MG PO TABS: 12.5000 mg | ORAL_TABLET | Freq: Every day | ORAL | 3 refills | Status: DC

## 2017-04-16 NOTE — Progress Notes (Signed)
ADVANCED HF CLINIC NOTE Primary HF: Dr. Gala Romney   HPI: Misty Gray is a 66 y/o woman with history of morbid obesity, OSA, chronic atrial fibrillation with tachy-brady syndrome s/p PPM, frequent PVCs, systolic HF due to NICM.  Admitted for ADHF in 10/17. Cath with normal coronary arteries and elevated filling pressures. Echo 10/17 EF 30-35% (previously 35-40%). 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 (She has 81% RV pacing and 15-20 PVCs per minute). Started on po amio with suppression of PVCs. Diuresed 41 pounds. Discharge weight 197.   Admitted 12/14 through 12/06/2016 with marked volume overload. Diuresed with IV lasix + metolazone. Discharge weight was 195 pounds.Transitioned to torsemide 40 mg in am and 20 mg in pm.   She returns today for regular follow up.  She has been following with Paramedicine. Feels much better. Says weight down to 157. Saw Dr. Johney Frame in 3/18. Was v-pacing 81%. PPM reprogrammed to VVIR 45 bpm to limit RV pacing. Given very narrow intrinsic QRS, he advised avoiding V pacing rather than CRT as our strategy going forward. Still drinking a lot of water. + edema. No orthopnea or PND. Gets around the house with her cane. Take torsemide 40/20. Does not take extra. SBP at home has been in the 90s.   Echo 3/18 reviewed personally. EF 15-20% moderate MR/TR  RV modrately HK   R/LHC 10/03/16 Ao = 94/58 (72) LV = 104/20 RA = 21 RV = 65/4/19 PA = 65/21 (38) PCW = 23 Fick cardiac output/index = 6.1/2.9 PVR = 2.5 WU FA sat = 97%  PA sat = 69%, 70%  Assessment: 1. Normal coronary arteries 2. NICM with EF 30-35% by echo 3. Persistently elevated biventricular pressures    Past Medical History:  Diagnosis Date  . Abnormal CT of the head 12/16/1984   R parietal atrophy  . Allergic rhinitis, cause unspecified   . Anemia   . Arthritis    "hands and knees" (09/06/2015)  . Atrial fibrillation (HCC)   . Cellulitis 09/07/2015  . Cellulitis and abscess of leg 09/2016   bilateral  . CHF (congestive heart failure) (HCC)   . Diaphragmatic hernia without mention of obstruction or gangrene   . Dyspnea   . Exertional dyspnea 06/22/12  . Family history of adverse reaction to anesthesia    "daughter fights w/them; just can't relax during OR; stay awake during the OR"  . Female stress incontinence   . GERD (gastroesophageal reflux disease)   . Grand mal seizure (HCC)   . H/O hiatal hernia    two removed  . Heart murmur   . High cholesterol   . History of blood transfusion 1956   "related to nose bleed"  . Hypertension   . Influenza A 01/11/2014  . Lichenification and lichen simplex chronicus   . Migraine    "when I was a teenager"  . Morbid obesity (HCC)   . Nonischemic cardiomyopathy (HCC)    EF has normalized-repeat Pending   . On home oxygen therapy    "2L when I'm asleep in bed" (09/06/2015)  . OSA on CPAP   . Pacemaker  st judes    Patient states "this is my third pacemaker"  . Pneumonia 2004; 2011; 11/2011  . Seizures (HCC) since 1981   "can hear you talking but sounds like you are in big tunnel; have them often if not taking RX; last one was 06/17/12" (09/06/2015)  . Shortness of breath 10/24/2010   Qualifier: Diagnosis of  By: [place], Bonnie    .  Sick sinus syndrome (HCC)    WITH PRIOR DDD PACEMAKER IMPLANTATION  . Stroke [facility]) 1988   "mouth drawed real bad on my left side; found out it was a seizure"  . Syncope and collapse 06/22/12   "hit forehead and left knee"; denies loss of consciousness  . Unspecified venous (peripheral) insufficiency     Current Outpatient Prescriptions  Medication Sig Dispense Refill  . amiodarone (PACERONE) 200 MG tablet Take 1 tablet (200 mg total) by mouth daily. 60 tablet 5  . fluticasone (FLONASE) 50 MCG/ACT nasal spray Place 2 sprays into both nostrils daily as needed. For congestion. 16 g 1  . omeprazole (PRILOSEC) 20 MG capsule TAKE ONE CAPSULE BY MOUTH EVERY DAY 90 capsule 1  . OXYGEN Inhale 2 L into the lungs  at bedtime. cpap machine    . PHENobarbital (LUMINAL) 32.4 MG tablet Take 2 tablets (64.8 mg total) by mouth 2 (two) times daily. 120 tablet 5  . potassium chloride (K-DUR,KLOR-CON) 10 MEQ tablet Take 4 tablets (40 mEq total) by mouth 2 (two) times daily. 240 tablet 3  . torsemide (DEMADEX) 20 MG tablet Take 1-2 tablets (20-40 mg total) by mouth as directed. Take 40 mg in the AM and 20 mg in the PM 90 tablet 6  . warfarin (COUMADIN) 5 MG tablet 2 tablets (10 mg) on Monday and Thursday;  1 tablet (5 mg) all other days 100 tablet 1  . cholecalciferol (VITAMIN D) 1000 units tablet Take 1 tablet (1,000 Units total) by mouth daily. (Patient not taking: Reported on 04/14/2017) 90 tablet 3  . metolazone (ZAROXOLYN) 2.5 MG tablet Take 2.5 mg by mouth as needed (weight gain or shortness of breath). Take as directed     No current facility-administered medications for this encounter.     Allergies  Allergen Reactions  . Aspirin Hives and Rash  . Penicillins Rash and Other (See Comments)    Amoxicillin also, Has patient had a PCN reaction causing immediate rash, facial/tongue/throat swelling, SOB or lightheadedness with hypotension: Yes Has patient had a PCN reaction causing severe rash involving mucus membranes or skin necrosis: No Has patient had a PCN reaction that required hospitalization No Has patient had a PCN reaction occurring within the last 10 years: Yes If all of the above answers are "NO", then may proceed with Cephalosporin use.   . Tape Other (See Comments)    NO PAPER TAPE-MUST BE MEDICAL TAPE - unknown reaction  . Wool Alcohol [Lanolin] Hives  . Amoxicillin Rash  . Ampicillin Itching and Rash      Social History   Social History  . Marital status: Married    Spouse name: [name]  . Number of children: 4  . Years of education: 12   Occupational History  . disabled    Social History Main Topics  . Smoking status: Former Smoker    Packs/day: 1.00    Years: 7.00     Types: Cigarettes    Quit date: 03/16/1969  . Smokeless tobacco: Never Used  . Alcohol use No  . Drug use: No  . Sexual activity: Not Currently    Birth control/ protection: Post-menopausal   Other Topics Concern  . Not on file   Social History Narrative   Lives with husband [name] lives with them as do his 2 children   Daughter, [name], and her 3 children live in [place] -  [name] is incarcerated   Sherrill, Onalee Hua  Butters lives with them   Daughter, [name], in [place] - [name] [address]   Moved June 2013 to a better home on [facility].      Lives with husband, son, grandson, granddaughter, nephew.      Family History  Problem Relation Age of Onset  . Stroke Father     died from CAD?  Marland Kitchen Heart disease Father   . Cancer Mother   . Diabetes Son     Type 1  . Sleep apnea Son   . Cancer Sister     cervical  . Hypertension Sister   . Hypertension Brother   . Rheum arthritis Daughter     Also PGM, PGGM    Vitals:   04/16/17 1344  BP: 104/62  Pulse: 61  SpO2: 100%  Weight: 162 lb 8 oz (73.7 kg)   Wt Readings from Last 3 Encounters:  04/16/17 162 lb 8 oz (73.7 kg)  04/14/17 161 lb (73 kg)  04/07/17 166 lb (75.3 kg)   PHYSICAL EXAM:  General: Son and granddaughter present. Walked into clinic with cane. NAD  HEENT: normal x for poor dentition Neck: supple. JVP 6. Carotids 2+ bilat; no bruits. No lymphadenopathy or thryomegaly appreciated. Cor: PMI nondisplaced. Irregular rate & rhythm. 2/6 TR Lungs: clear with decreased BS throughout Abdomen: obese soft, nontender, nondistended. No hepatosplenomegaly. No bruits or masses. Good bowel sounds. Extremities: no cyanosis, clubbing, rash, trace-1+ edema Neuro: alert & orientedx3, cranial nerves grossly intact. moves all 4 extremities w/o difficulty. Affect pleasant   ASSESSMENT & PLAN: 1. Chronic systolic HF due to NICM. ? PVC induced vs RV pacing. Echo 12/06/2016 EF  10-15%. Cath 10/17 normal coronary arteries.   --Echo 3/18 with LVEF 15-20% despite suppression of PVCs.  - Continue torsemide to 40/20 - Unable to tolerate BB due to low output.  - BP has been too soft for ACE/ARB/ARNI. Will try losartan again. 12.5mg  qhs, - Has been seen by EP. Not candidate for ICD or CRT - Reinforced fluid restriction to < 2 L daily, sodium restriction to less than 2000 mg daily, and the importance of daily weights.   - Patient seen with Paramedicine in room and plan discussed - Labs today.  - F/u with echo in 2 months  2. Frequent PVCs - PVCs suppressed with amio. Will continue for now at 200 daily. Device interrogation shows only 2% PVCs.  3. Obesity  - Discussed need for weight loss efforts 4. Chronic AF with tach brady s/p pacemaker - Will switch to DOAC (spoke to pharmacy - can only use Syvasa with phenobarbital) 5. Chronic venous stasis - Improved with volume control   Arvilla Meres, MD  1:54 PM

## 2017-04-16 NOTE — Patient Instructions (Signed)
Stop Coumadin (Warfarin)   Start Eliquis 5 mg Twice daily, we will call and let you know when to start this  Start Losartan 12.5 mg (1/2 tab) daily at bedtime  Labs today  Your physician recommends that you schedule a follow-up appointment in: 2 months with echocardiogram

## 2017-04-16 NOTE — Telephone Encounter (Signed)
Eliquis interacts with Ms. Ferraris's phenobarbital potentially decreasing the concentrations and thus effectiveness of Eliquis. The only DOAC that does not have this interaction is Savaysa and with her CrCl ~67 ml/min, she would qualify for full dose 60 mg daily. Her INR today is 2.61 and to initiate Savaysa her INR must be < 2.5. Chantel, CMA called and relayed to her not take any Coumadin today and start her Savaysa tomorrow as her INR will likely be below 2.5 by then. [facility] Rx sent to [facility]. Her PA for [name] is in process but we should have an answer within 24 hours.   [name]. Bonnye Fava, PharmD, BCPS, CPP Clinical Pharmacist Pager: [phone] Phone: [phone] 04/16/2017 4:42 PM

## 2017-04-16 NOTE — Progress Notes (Signed)
Paramedicine Encounter   Patient ID: Misty Gray , female,   DOB: March 07, 1951,65 y.o.,  MRN: [id number]   Met patient in clinic today with provider.  Time spent with patient 45 min  Fluid level good today. He will start losartan 12.45m at bedtime. She will have a repeat echo in 6-8wks. She is due to get her money soon. Also switching her to eliquis.  She states she will get paid tomor and will be able to get her meds. HNira Connstates her INR has to be less than 2 before she will start it and she also has a free month card to use-she will call me to let me know when to go place it in her pill box.   Misty Gray EMT-Paramedic 04/16/2017   ACTION: Home visit completed

## 2017-04-17 ENCOUNTER — Other Ambulatory Visit (HOSPITAL_COMMUNITY): Payer: Self-pay | Admitting: *Deleted

## 2017-04-17 MED ORDER — AMIODARONE HCL 200 MG PO TABS: 200.0000 mg | ORAL_TABLET | Freq: Every day | ORAL | 5 refills | Status: DC

## 2017-04-21 ENCOUNTER — Telehealth (HOSPITAL_COMMUNITY): Payer: Self-pay | Admitting: Pharmacist

## 2017-04-21 NOTE — Telephone Encounter (Signed)
Savaysa 60 mg daily PA approved by [facility] Part D through 12/15/17.   [name]. Bonnye Fava, PharmD, BCPS, CPP Clinical Pharmacist Pager: [phone] Phone: [phone] 04/21/2017 9:31 AM

## 2017-04-23 NOTE — Progress Notes (Deleted)
   Subjective:    Patient ID: Misty Gray, female    DOB: 1951/11/16, 66 y.o.   MRN: [id number]  HPI 66 y/o female presents for routine follow up.  LE edema due to chronic venous insufficiency/Systolic CHF Prescribed Torsemide 40 mg in AM and 20 mg in PM  Hypotension Recently started on Losartan 12.5 mg daily by Cardiology (for history of Systolic CHF).   Atrial Fibrillation Prescribed Amiodarone 200 mg daily, Warfarin recently transitioned to Savaysa (Edoxaban)   Social Former Smoker  Review of Systems     Objective:   Physical Exam There were no vitals taken for this visit.        Assessment & Plan:  No problem-specific Assessment & Plan notes found for this encounter.

## 2017-04-24 ENCOUNTER — Ambulatory Visit: Payer: Medicare HMO | Admitting: Family Medicine

## 2017-04-24 ENCOUNTER — Other Ambulatory Visit (HOSPITAL_COMMUNITY): Payer: Self-pay

## 2017-04-24 NOTE — Progress Notes (Signed)
Paramedicine Encounter    Patient ID: Misty Gray, female    DOB: 06-04-1951, 66 y.o.   MRN: [id number]   Patient Care Team: Uvaldo Rising, MD as PCP - General (Family Medicine) [name], MD (Cardiology) Kemper Durie, RN as [facility] Care Management  Patient Active Problem List   Diagnosis Date Noted  . Chronic venous stasis dermatitis 12/26/2016  . Urinary tract infection without hematuria   . Frequent PVCs   . Hypokalemia   . Somnolence   . Altered mental status 11/28/2016  . Housing problems 11/23/2016  . Chronic atrial fibrillation (HCC) 11/01/2016  . Hypotension 10/31/2016  . Special screening for malignant neoplasms, colon 08/12/2016  . Heme positive stool 08/09/2016  . Intertrigo 08/09/2016  . Chronic anticoagulation 04/26/2016  . Chronic venous insufficiency   . Preventative health care 10/05/2014  . Breast mass, left 10/05/2014  . Osteopenia 09/15/2014  . Hypertension 01/04/2014  . Chronic systolic dysfunction of left ventricle 05/05/2013  . At high risk for falls 02/11/2013  . Vitamin D deficiency 07/15/2012  . Fatigue 06/22/2012  . Sinoatrial node dysfunction (HCC) 05/08/2011  . Allergic rhinitis 05/31/2008  . Morbid obesity (HCC) 02/12/2007  . GASTROESOPHAGEAL REFLUX, NO ESOPHAGITIS 02/12/2007  . Female stress incontinence 02/12/2007  . Seizure disorder (HCC) 02/12/2007  . OSA (obstructive sleep apnea) 02/12/2007    Current Outpatient Prescriptions:  .  amiodarone (PACERONE) 200 MG tablet, Take 1 tablet (200 mg total) by mouth daily., Disp: 30 tablet, Rfl: 2 .  cholecalciferol (VITAMIN D) 1000 units tablet, Take 1 tablet (1,000 Units total) by mouth daily., Disp: 90 tablet, Rfl: 3 .  edoxaban (SAVAYSA) 60 MG TABS tablet, Take 60 mg by mouth daily., Disp: 30 tablet, Rfl: 11 .  losartan (COZAAR) 25 MG tablet, Take 0.5 tablets (12.5 mg total) by mouth at bedtime., Disp: 15 tablet, Rfl: 3 .  omeprazole (PRILOSEC) 20 MG capsule, TAKE ONE  CAPSULE BY MOUTH EVERY DAY, Disp: 90 capsule, Rfl: 1 .  PHENobarbital (LUMINAL) 32.4 MG tablet, Take 2 tablets (64.8 mg total) by mouth 2 (two) times daily., Disp: 120 tablet, Rfl: 5 .  potassium chloride (K-DUR,KLOR-CON) 10 MEQ tablet, Take 4 tablets (40 mEq total) by mouth 2 (two) times daily., Disp: 240 tablet, Rfl: 3 .  torsemide (DEMADEX) 20 MG tablet, Take 1-2 tablets (20-40 mg total) by mouth as directed. Take 40 mg in the AM and 20 mg in the PM, Disp: 90 tablet, Rfl: 6 .  fluticasone (FLONASE) 50 MCG/ACT nasal spray, Place 2 sprays into both nostrils daily as needed. For congestion. (Patient not taking: Reported on 04/24/2017), Disp: 16 g, Rfl: 1 .  metolazone (ZAROXOLYN) 2.5 MG tablet, Take 2.5 mg by mouth as needed (weight gain or shortness of breath). Take as directed, Disp: , Rfl:  .  OXYGEN, Inhale 2 L into the lungs at bedtime. cpap machine, Disp: , Rfl:  Allergies  Allergen Reactions  . Aspirin Hives and Rash  . Penicillins Rash and Other (See Comments)    Amoxicillin also, Has patient had a PCN reaction causing immediate rash, facial/tongue/throat swelling, SOB or lightheadedness with hypotension: Yes Has patient had a PCN reaction causing severe rash involving mucus membranes or skin necrosis: No Has patient had a PCN reaction that required hospitalization No Has patient had a PCN reaction occurring within the last 10 years: Yes If all of the above answers are "NO", then may proceed with Cephalosporin use.   . Tape Other (See Comments)  NO PAPER TAPE-MUST BE MEDICAL TAPE - unknown reaction  . Wool Alcohol [Lanolin] Hives  . Amoxicillin Rash  . Ampicillin Itching and Rash     Social History   Social History  . Marital status: Married    Spouse name: [name]  . Number of children: 4  . Years of education: 12   Occupational History  . disabled    Social History Main Topics  . Smoking status: Former Smoker    Packs/day: 1.00    Years: 7.00    Types: Cigarettes     Quit date: 03/16/1969  . Smokeless tobacco: Never Used  . Alcohol use No  . Drug use: No  . Sexual activity: Not Currently    Birth control/ protection: Post-menopausal   Other Topics Concern  . Not on file   Social History Narrative   Lives with husband [name] lives with them as do his 2 children   Daughter, [name], and her 3 children live in [place] -  [name] is incarcerated   Nephew, Florenda Watt lives with them   Daughter, [name], in [place] [name] [address]   Moved June 2013 to a better home on [facility].      Lives with husband, son, grandson, granddaughter, nephew.    Physical Exam  Constitutional: She is oriented to person, place, and time.  Pulmonary/Chest: Effort normal and breath sounds normal. No respiratory distress. She has no wheezes. She has no rales.  Abdominal: Soft.  Neurological: She is oriented to person, place, and time.  Skin: Skin is warm and dry.  Psychiatric: She has a normal mood and affect.        Future Appointments Date Time Provider Department Center  05/29/2017 11:00 AM [name], MD [name]  06/17/2017 1:00 PM MC ECHO 1-BUZZ [facility]  06/17/2017 2:00 PM [name], [name], MD [facility] None   BP (!) 84/60   Pulse 60   Resp 16   Wt 158 lb 9.6 oz (71.9 kg)   SpO2 98%   BMI 30.97 kg/m  Weight yesterday-161.2 Last visit weight-162  Pt reports she is doing well, she is ambulatory well around the house. When checking her pill box-it had a few days filled already-she had filled her pill box this morning-but had started the savaysa twice a day already when she picked it up-- and looking at her pill bottles she had been prescribed 30 of the savaysa--she had only 15 left and she just got it last week- when I asked her how does she take that one she said like the directions on the bottle and she said she takes it twice a day. Which is incorrect--it has listed  once a day. So I will let the clinic know--it is after 5 so I left a message. I advised her we need to come up with a better system for her to be sure she has enough money for all her meds--- She has enough phenobarb until sat am--will work on that to see what we can do. She gets paid on first of month and I advised her if she can set aside money just for her meds--- paramedicine needs to be continued. Her b/p as noted, very faint radial pulse-pt denies any sob, no dizziness, due to the time I left a message on the triage line about her b/p and the extra savaysa pills.  meds verified and pill box refilled.  ACTION: Home visit completed  Kerry Hough, EMT-Paramedic 04/28/17

## 2017-04-25 ENCOUNTER — Telehealth (HOSPITAL_COMMUNITY): Payer: Self-pay

## 2017-04-25 NOTE — Telephone Encounter (Signed)
Received call from [facility] with CHF paramedicine reporting patient's BP 84/60, pulses faint at 60. Started Losartan 12.5 mg once daily at bedtime last week after visit with Dr. Gala Romney. Reports previous low BP with ACE/ARBS. Next f/u with Dr. Gala Romney 7/3 with echo. Will forward to Dr. Gala Romney to advise.  Ave Filter, RN

## 2017-04-25 NOTE — Telephone Encounter (Signed)
2nd attempt to reach patient regarding low BP per paramedicine call this morning. Again VM left to stop Losartan per Dr. Gala Romney and return call to clinic to let us know she received this message. Will attempt to call again later today or tomorrow morning as time allows.  Ave Filter, RN

## 2017-04-25 NOTE — Telephone Encounter (Signed)
Spoke with Dr. Gala Romney, advised to STOP losartan. Left VM on patient's line to return call to review instructions. Will attempt to call again later today.  Ave Filter, RN

## 2017-04-28 ENCOUNTER — Other Ambulatory Visit: Payer: Self-pay | Admitting: *Deleted

## 2017-04-28 ENCOUNTER — Other Ambulatory Visit (HOSPITAL_COMMUNITY): Payer: Self-pay

## 2017-04-28 NOTE — Patient Outreach (Signed)
[facility]) Care Management  04/28/2017  DOCIA MAGNIFICO 1951/06/11 [id number]   Noted that member has had medication changes in the past few weeks.  Call placed to K. Lynch, Paramedic, to collaborate on plan of care.  She report that the member has consistently needed financial assistance for medications, with the heart failure clinic providing coverage for some medications for the past couple months.  She state she has discussed with the member other arrangements to consider in effort to afford medications going forward.  This care manager will place referral to pharmacy for medication assistance, will also discuss case with LCSW for possible referral budget as well as need for step stool for safety.    Call placed to member to schedule home visit for this month, scheduled for next week.  She denies any urgent concerns at this time.  Kemper Durie, [place], MSN [facility] Care Management  [facility] Manager [phone]

## 2017-04-28 NOTE — Progress Notes (Signed)
Paramedicine Encounter    Patient ID: Misty Gray, female    DOB: 10/22/1951, 66 y.o.   MRN: [id number]   Patient Care Team: Uvaldo Rising, MD as PCP - General (Family Medicine) [name], MD (Cardiology) Kemper Durie, RN as [facility] Care Management  Patient Active Problem List   Diagnosis Date Noted  . Chronic venous stasis dermatitis 12/26/2016  . Urinary tract infection without hematuria   . Frequent PVCs   . Hypokalemia   . Somnolence   . Altered mental status 11/28/2016  . Housing problems 11/23/2016  . Chronic atrial fibrillation (HCC) 11/01/2016  . Hypotension 10/31/2016  . Special screening for malignant neoplasms, colon 08/12/2016  . Heme positive stool 08/09/2016  . Intertrigo 08/09/2016  . Chronic anticoagulation 04/26/2016  . Chronic venous insufficiency   . Preventative health care 10/05/2014  . Breast mass, left 10/05/2014  . Osteopenia 09/15/2014  . Hypertension 01/04/2014  . Chronic systolic dysfunction of left ventricle 05/05/2013  . At high risk for falls 02/11/2013  . Vitamin D deficiency 07/15/2012  . Fatigue 06/22/2012  . Sinoatrial node dysfunction (HCC) 05/08/2011  . Allergic rhinitis 05/31/2008  . Morbid obesity (HCC) 02/12/2007  . GASTROESOPHAGEAL REFLUX, NO ESOPHAGITIS 02/12/2007  . Female stress incontinence 02/12/2007  . Seizure disorder (HCC) 02/12/2007  . OSA (obstructive sleep apnea) 02/12/2007    Current Outpatient Prescriptions:  .  amiodarone (PACERONE) 200 MG tablet, Take 1 tablet (200 mg total) by mouth daily., Disp: 30 tablet, Rfl: 2 .  cholecalciferol (VITAMIN D) 1000 units tablet, Take 1 tablet (1,000 Units total) by mouth daily., Disp: 90 tablet, Rfl: 3 .  edoxaban (SAVAYSA) 60 MG TABS tablet, Take 60 mg by mouth daily., Disp: 30 tablet, Rfl: 11 .  fluticasone (FLONASE) 50 MCG/ACT nasal spray, Place 2 sprays into both nostrils daily as needed. For congestion., Disp: 16 g, Rfl: 1 .  losartan (COZAAR) 25 MG  tablet, Take 0.5 tablets (12.5 mg total) by mouth at bedtime., Disp: 15 tablet, Rfl: 3 .  omeprazole (PRILOSEC) 20 MG capsule, TAKE ONE CAPSULE BY MOUTH EVERY DAY, Disp: 90 capsule, Rfl: 1 .  PHENobarbital (LUMINAL) 32.4 MG tablet, Take 2 tablets (64.8 mg total) by mouth 2 (two) times daily., Disp: 120 tablet, Rfl: 5 .  potassium chloride (K-DUR,KLOR-CON) 10 MEQ tablet, Take 4 tablets (40 mEq total) by mouth 2 (two) times daily., Disp: 240 tablet, Rfl: 3 .  torsemide (DEMADEX) 20 MG tablet, Take 1-2 tablets (20-40 mg total) by mouth as directed. Take 40 mg in the AM and 20 mg in the PM, Disp: 90 tablet, Rfl: 6 .  metolazone (ZAROXOLYN) 2.5 MG tablet, Take 2.5 mg by mouth as needed (weight gain or shortness of breath). Take as directed, Disp: , Rfl:  .  OXYGEN, Inhale 2 L into the lungs at bedtime. cpap machine, Disp: , Rfl:  Allergies  Allergen Reactions  . Aspirin Hives and Rash  . Penicillins Rash and Other (See Comments)    Amoxicillin also, Has patient had a PCN reaction causing immediate rash, facial/tongue/throat swelling, SOB or lightheadedness with hypotension: Yes Has patient had a PCN reaction causing severe rash involving mucus membranes or skin necrosis: No Has patient had a PCN reaction that required hospitalization No Has patient had a PCN reaction occurring within the last 10 years: Yes If all of the above answers are "NO", then may proceed with Cephalosporin use.   . Tape Other (See Comments)    NO PAPER TAPE-MUST BE  MEDICAL TAPE - unknown reaction  . Wool Alcohol [Lanolin] Hives  . Amoxicillin Rash  . Ampicillin Itching and Rash     Social History   Social History  . Marital status: Married    Spouse name: Misty Gray  . Number of children: 4  . Years of education: 12   Occupational History  . disabled    Social History Main Topics  . Smoking status: Former Smoker    Packs/day: 1.00    Years: 7.00    Types: Cigarettes    Quit date: 03/16/1969  . Smokeless  tobacco: Never Used  . Alcohol use No  . Drug use: No  . Sexual activity: Not Currently    Birth control/ protection: Post-menopausal   Other Topics Concern  . Not on file   Social History Narrative   Lives with husband Misty Gray - Misty Gray lives with them as do his 2 children   Daughter, Misty Gray, and her 3 children live in [place] -  Misty Gray is incarcerated   Nephew, Misty Gray lives with them   Daughter, Misty Gray, in [name] [address]   Moved June 2013 to a better home on [facility].      Lives with husband, son, grandson, granddaughter, nephew.    Physical Exam      Future Appointments Date Time Provider Department Center  05/29/2017 11:00 AM [name], MD [name]  06/17/2017 1:00 PM MC ECHO 1-BUZZ [facility]  06/17/2017 2:00 PM [name], [name], MD [facility] None   BP 102/64   Pulse 60   Resp 15   Wt 161 lb (73 kg)   SpO2 98%   BMI 31.44 kg/m  Weight yesterday-161 Last visit weight-158  Pt was not able to get her phenobarb this wknd-we was able to get some money from HF fund to help aid with this-when I went to pharmacy the pharmacist advised she was no longer eligible for the insurance and the co-pay was almost $60--so I contacted pt and she advised there was an insurance coverage change and she now has [employer]na--she did not get the card yet but the pharmacy was able to look up her info when I called and got her SSN for the pharmacy and was able to get her meds. So now I am back here at the home to place them in her pill box. She states she is doing ok.  Also spoke with monica with [facility] this morning, she advised that pt stated that she did not need the step stool anymore however I advised monica she did need it and also advised pt to let them bring it and try it as it is much safer for her to use.  It appears that she missed her Thursday pm dose of meds due to her forgetting about it. She got tired  and fell asleep.  She denies any sob, no dizziness, no h/a.  She needs potassium and torsemide called in--son said he should have the money for them. I called them both in to pharmacy.    ACTION: Home visit completed Next visit planned for next week  Kerry Hough, EMT-Paramedic 04/28/17

## 2017-04-29 ENCOUNTER — Encounter (HOSPITAL_COMMUNITY): Payer: Self-pay

## 2017-04-29 NOTE — Progress Notes (Signed)
Patient still unable to be reached by phone, multiple calls and VMs made to patient. Spoke with Kerry Hough CHF paramedic in person to have patient stop Losartan per Dr. Gala Romney.  Ave Filter, RN

## 2017-04-30 ENCOUNTER — Telehealth (HOSPITAL_COMMUNITY): Payer: Self-pay

## 2017-04-30 NOTE — Telephone Encounter (Signed)
clinic advised they were not able to get in touch with pt friday after multiple attempts about her stopping the losartan, i was able to get in contact with her-she was able to remove the losartan from her pillbox and it was the correct pill-green half tab

## 2017-05-05 ENCOUNTER — Other Ambulatory Visit: Payer: Self-pay | Admitting: *Deleted

## 2017-05-05 ENCOUNTER — Other Ambulatory Visit (HOSPITAL_COMMUNITY): Payer: Self-pay

## 2017-05-05 ENCOUNTER — Telehealth (HOSPITAL_COMMUNITY): Payer: Self-pay | Admitting: *Deleted

## 2017-05-05 NOTE — Telephone Encounter (Signed)
Misty Gray called concerned about patients bp and heart rate. Patients bp was 88/50 and heart rate 52.  No symptoms at this time. Patient states she feels "fine".    Message routed to Dr.[name] for any changes

## 2017-05-05 NOTE — Patient Outreach (Signed)
[facility]) Care Management   05/05/2017  Misty Gray 15-Aug-1951 [id number]  Misty Gray is an 66 y.o. female  Subjective:   Member alert & oriented x3, denies pain or discomfort.  Denies chest discomfort or shortness of breath.  Report taking all medications as prescribed, state that K. Lynch, EMT, will visit today.  Report weighing self most days, state that scale fluctuates but also state that her appetite has been fluctuating as well.    Objective:   Review of Systems  Constitutional: Negative.   HENT: Negative.   Eyes: Negative.   Respiratory: Negative.   Cardiovascular: Negative.   Gastrointestinal: Negative.   Genitourinary: Negative.   Musculoskeletal: Negative.   Skin: Negative.   Neurological: Negative.   Endo/Heme/Allergies: Negative.   Psychiatric/Behavioral: Negative.     Physical Exam  Constitutional: She is oriented to person, place, and time. She appears well-developed and well-nourished.  Neck: Normal range of motion.  Cardiovascular: Normal heart sounds.   Irregular  Respiratory: Effort normal and breath sounds normal.  GI: Soft. Bowel sounds are normal.  Musculoskeletal: Normal range of motion.  Neurological: She is alert and oriented to person, place, and time.  Skin: Skin is warm and dry.   BP (!) 88/50 (BP Location: Left Arm, Patient Position: Sitting, Cuff Size: Normal)   Pulse (!) 52   Resp 16   Wt 155 lb (70.3 kg)   SpO2 98%   BMI 30.27 kg/m   Encounter Medications:   Outpatient Encounter Prescriptions as of 05/05/2017  Medication Sig Note  . amiodarone (PACERONE) 200 MG tablet Take 1 tablet (200 mg total) by mouth daily.   . cholecalciferol (VITAMIN D) 1000 units tablet Take 1 tablet (1,000 Units total) by mouth daily.   Marland Kitchen edoxaban (SAVAYSA) 60 MG TABS tablet Take 60 mg by mouth daily.   . fluticasone (FLONASE) 50 MCG/ACT nasal spray Place 2 sprays into both nostrils daily as needed. For congestion.   . metolazone  (ZAROXOLYN) 2.5 MG tablet Take 2.5 mg by mouth as needed (weight gain or shortness of breath). Take as directed   . omeprazole (PRILOSEC) 20 MG capsule TAKE ONE CAPSULE BY MOUTH EVERY DAY   . OXYGEN Inhale 2 L into the lungs at bedtime. cpap machine 02/27/2017: Not able to use-when she plugs in her machine it trips the breaker and blows fuses  . PHENobarbital (LUMINAL) 32.4 MG tablet Take 2 tablets (64.8 mg total) by mouth 2 (two) times daily.   . potassium chloride (K-DUR,KLOR-CON) 10 MEQ tablet Take 4 tablets (40 mEq total) by mouth 2 (two) times daily.   Marland Kitchen torsemide (DEMADEX) 20 MG tablet Take 1-2 tablets (20-40 mg total) by mouth as directed. Take 40 mg in the AM and 20 mg in the PM    No facility-administered encounter medications on file as of 05/05/2017.     Functional Status:   In your present state of health, do you have any difficulty performing the following activities: 03/10/2017 11/28/2016  Hearing? N N  Vision? N N  Difficulty concentrating or making decisions? N N  Walking or climbing stairs? N Y  Dressing or bathing? N Y  Doing errands, shopping? N Y  Conservation officer, nature and eating ? N -  Using the Toilet? N -  In the past six months, have you accidently leaked urine? Y -  Do you have problems with loss of bowel control? N -  Managing your Medications? N -  Managing your Finances? Y -  Housekeeping or managing your Housekeeping? Y -  Some recent data might be hidden    Fall/Depression Screening:    Fall Risk  03/18/2017 03/10/2017 02/20/2017  Falls in the past year? No No No  Number falls in past yr: - - -  Injury with Fall? - - -  Risk for fall due to : - Impaired balance/gait -   PHQ 2/9 Scores 03/18/2017 03/10/2017 02/28/2017 02/20/2017 02/07/2017 01/24/2017 12/12/2016  PHQ - 2 Score 0 0 0 0 0 0 0    Assessment:    Met with member at scheduled time, able to ambulate in the home without cane, no shortness of breath noted.  Compliance noted with pill box, refilled today by this  care manager.  Refill for Savaysa called in to pharmacy.  Synetta Shadow, EMT also present during visit.  Member state that she is unsure if she will have money for refill.  She will need to have prior to next Wednesday, will not receive her check until after refill needed.  This care manager offered to have social worker and pharmacist contact member regarding possible extra help and budgeting in effort to have money set aside for monthly medications, however member states that she now has new Medicare plan with [employer].  She is made aware that [facility] has not covered [employer] Medicare patients in the past but will have care management assistant verify eligibility.  Misty Gray will look into the heart failure clinic providing assistance for medications next week.    Member re-educated on proper heart failure diet, she verbalizes understanding.  Re-educated on importance of daily weights as well as daily blood pressure monitoring as she has history of hypotension.  Blood pressure monitor placed beside scale, advised to check both every morning at the same time and record on her calendar.  She verbalizes understanding.  Contacted care management assistant to confirm eligibility, member no longer eligible for services due to her insurance change.  Member was advised that she may not be eligible prior to end of visit, but will contact to confirm with her that her case with [facility] will be closed.  Plan:   Will contact [employer] to inquire about a care management program. Will notify primary MD, K. Lynch (EMT), and care management assistant of case closure.  Valente David, [place], MSN [place] [phone]

## 2017-05-05 NOTE — Progress Notes (Signed)
Paramedicine Encounter    Patient ID: Misty Gray, female    DOB: [birth year], 66 y.o.   MRN: [id number]   Patient Care Team: Misty Rising, MD as PCP - General (Family Medicine) Misty Range, MD (Cardiology) Misty Durie, RN as [facility] Care Management  Patient Active Problem List   Diagnosis Date Noted  . Chronic venous stasis dermatitis 12/26/2016  . Urinary tract infection without hematuria   . Frequent PVCs   . Hypokalemia   . Somnolence   . Altered mental status 11/28/2016  . Housing problems 11/23/2016  . Chronic atrial fibrillation (HCC) 11/01/2016  . Hypotension 10/31/2016  . Special screening for malignant neoplasms, colon 08/12/2016  . Heme positive stool 08/09/2016  . Intertrigo 08/09/2016  . Chronic anticoagulation 04/26/2016  . Chronic venous insufficiency   . Preventative health care 10/05/2014  . Breast mass, left 10/05/2014  . Osteopenia 09/15/2014  . Hypertension 01/04/2014  . Chronic systolic dysfunction of left ventricle 05/05/2013  . At high risk for falls 02/11/2013  . Vitamin D deficiency 07/15/2012  . Fatigue 06/22/2012  . Sinoatrial node dysfunction (HCC) 05/08/2011  . Allergic rhinitis 05/31/2008  . Morbid obesity (HCC) 02/12/2007  . GASTROESOPHAGEAL REFLUX, NO ESOPHAGITIS 02/12/2007  . Female stress incontinence 02/12/2007  . Seizure disorder (HCC) 02/12/2007  . OSA (obstructive sleep apnea) 02/12/2007    Current Outpatient Prescriptions:  .  amiodarone (PACERONE) 200 MG tablet, Take 1 tablet (200 mg total) by mouth daily., Disp: 30 tablet, Rfl: 2 .  cholecalciferol (VITAMIN D) 1000 units tablet, Take 1 tablet (1,000 Units total) by mouth daily., Disp: 90 tablet, Rfl: 3 .  edoxaban (SAVAYSA) 60 MG TABS tablet, Take 60 mg by mouth daily., Disp: 30 tablet, Rfl: 11 .  fluticasone (FLONASE) 50 MCG/ACT nasal spray, Place 2 sprays into both nostrils daily as needed. For congestion., Disp: 16 g, Rfl: 1 .  omeprazole (PRILOSEC) 20  MG capsule, TAKE ONE CAPSULE BY MOUTH EVERY DAY, Disp: 90 capsule, Rfl: 1 .  PHENobarbital (LUMINAL) 32.4 MG tablet, Take 2 tablets (64.8 mg total) by mouth 2 (two) times daily., Disp: 120 tablet, Rfl: 5 .  potassium chloride (K-DUR,KLOR-CON) 10 MEQ tablet, Take 4 tablets (40 mEq total) by mouth 2 (two) times daily., Disp: 240 tablet, Rfl: 3 .  torsemide (DEMADEX) 20 MG tablet, Take 1-2 tablets (20-40 mg total) by mouth as directed. Take 40 mg in the AM and 20 mg in the PM, Disp: 90 tablet, Rfl: 6 .  metolazone (ZAROXOLYN) 2.5 MG tablet, Take 2.5 mg by mouth as needed (weight gain or shortness of breath). Take as directed, Disp: , Rfl:  .  OXYGEN, Inhale 2 L into the lungs at bedtime. cpap machine, Disp: , Rfl:  Allergies  Allergen Reactions  . Aspirin Hives and Rash  . Penicillins Rash and Other (See Comments)    Amoxicillin also, Has patient had a PCN reaction causing immediate rash, facial/tongue/throat swelling, SOB or lightheadedness with hypotension: Yes Has patient had a PCN reaction causing severe rash involving mucus membranes or skin necrosis: No Has patient had a PCN reaction that required hospitalization No Has patient had a PCN reaction occurring within the last 10 years: Yes If all of the above answers are "NO", then may proceed with Cephalosporin use.   . Tape Other (See Comments)    NO PAPER TAPE-MUST BE MEDICAL TAPE - unknown reaction  . Wool Alcohol [Lanolin] Hives  . Amoxicillin Rash  . Ampicillin Itching and Rash  Social History   Social History  . Marital status: Married    Spouse name: Misty Gray  . Number of children: 4  . Years of education: 12   Occupational History  . disabled    Social History Main Topics  . Smoking status: Former Smoker    Packs/day: 1.00    Years: 7.00    Types: Cigarettes    Quit date: 03/16/1969  . Smokeless tobacco: Never Used  . Alcohol use No  . Drug use: No  . Sexual activity: Not Currently    Birth control/ protection:  Post-menopausal   Other Topics Concern  . Not on file   Social History Narrative   Lives with husband Misty Gray - Misty Gray lives with them as do his 2 children   Daughter, Misty Gray, and her 3 children live in [place] -  Misty Gray is incarcerated   Nephew, Misty Gray lives with them   Daughter, Misty Gray, in [name]   Moved June 2013 to a better home on [name].      Lives with husband, son, grandson, granddaughter, nephew.    Physical Exam      Future Appointments Date Time Provider Department Center  05/15/2017 9:15 AM Misty Rising, MD [facility]  05/29/2017 11:00 AM Misty Range, MD [name]  06/17/2017 1:00 PM MC ECHO 1-BUZZ [name]  06/17/2017 2:00 PM [name], [name], MD [facility] None   BP (!) 88/50   Pulse (!) 52   Resp 16   Wt 155 lb 6.4 oz (70.5 kg)   SpO2 98%   BMI 30.35 kg/m  Weight yesterday-Saturday--161.4 Last visit weight-161  Pt reports she is doing well. Misty Gray with [name] nurse was here also. She was filling her pill box when I arrived. Pt got her meds this wknd.  Pt states her appetite comes and goes-she eats when she gets hungry.  She needs refill on savaysa for next week.  Pt reports her breathing is doing good. Pt is able to walk on flat ground without difficulty, she has difficulty with hills. No dizziness, no weakness noted. No swelling noted. Her HR was 52-pt was resting in the bed. After she sat it her HR increased to 56. Left message on clinic triage line regarding her HR and bp.  Will see next week unless the clinic advises any changes.   ACTION: Home visit completed Next visit planned for next week  Misty Gray, EMT-Paramedic 05/05/17

## 2017-05-09 ENCOUNTER — Other Ambulatory Visit: Payer: Self-pay | Admitting: *Deleted

## 2017-05-09 NOTE — Patient Outreach (Signed)
[facility]) Care Management  05/09/2017  Misty Gray 03-May-1951 [id number]   Call placed to member to inform her that she is no longer eligible for services due to insurance change.  Call was placed to [employer] to inquire about case management services, but member will have to contact directly as coverage depends on the plan.  Gardner Candle, EMT notified.  Will inform care management assistant and primary MD of case closure.  Kemper Durie, [place], MSN [facility] Care Management  [facility] Manager [phone]

## 2017-05-12 ENCOUNTER — Other Ambulatory Visit (HOSPITAL_COMMUNITY): Payer: Self-pay

## 2017-05-12 NOTE — Progress Notes (Signed)
Paramedicine Encounter    Patient ID: Misty Gray, female    DOB: 02-19-1951, 66 y.o.   MRN: [id number]   Patient Care Team: Uvaldo Rising, MD as PCP - General (Family Medicine) [name], MD (Cardiology) Kemper Durie, RN as [facility] Care Management  Patient Active Problem List   Diagnosis Date Noted  . Chronic venous stasis dermatitis 12/26/2016  . Urinary tract infection without hematuria   . Frequent PVCs   . Hypokalemia   . Somnolence   . Altered mental status 11/28/2016  . Housing problems 11/23/2016  . Chronic atrial fibrillation (HCC) 11/01/2016  . Hypotension 10/31/2016  . Special screening for malignant neoplasms, colon 08/12/2016  . Heme positive stool 08/09/2016  . Intertrigo 08/09/2016  . Chronic anticoagulation 04/26/2016  . Chronic venous insufficiency   . Preventative health care 10/05/2014  . Breast mass, left 10/05/2014  . Osteopenia 09/15/2014  . Hypertension 01/04/2014  . Chronic systolic dysfunction of left ventricle 05/05/2013  . At high risk for falls 02/11/2013  . Vitamin D deficiency 07/15/2012  . Fatigue 06/22/2012  . Sinoatrial node dysfunction (HCC) 05/08/2011  . Allergic rhinitis 05/31/2008  . Morbid obesity (HCC) 02/12/2007  . GASTROESOPHAGEAL REFLUX, NO ESOPHAGITIS 02/12/2007  . Female stress incontinence 02/12/2007  . Seizure disorder (HCC) 02/12/2007  . OSA (obstructive sleep apnea) 02/12/2007    Current Outpatient Prescriptions:  .  amiodarone (PACERONE) 200 MG tablet, Take 1 tablet (200 mg total) by mouth daily., Disp: 30 tablet, Rfl: 2 .  cholecalciferol (VITAMIN D) 1000 units tablet, Take 1 tablet (1,000 Units total) by mouth daily., Disp: 90 tablet, Rfl: 3 .  edoxaban (SAVAYSA) 60 MG TABS tablet, Take 60 mg by mouth daily., Disp: 30 tablet, Rfl: 11 .  fluticasone (FLONASE) 50 MCG/ACT nasal spray, Place 2 sprays into both nostrils daily as needed. For congestion., Disp: 16 g, Rfl: 1 .  omeprazole (PRILOSEC) 20  MG capsule, TAKE ONE CAPSULE BY MOUTH EVERY DAY, Disp: 90 capsule, Rfl: 1 .  PHENobarbital (LUMINAL) 32.4 MG tablet, Take 2 tablets (64.8 mg total) by mouth 2 (two) times daily., Disp: 120 tablet, Rfl: 5 .  potassium chloride (K-DUR,KLOR-CON) 10 MEQ tablet, Take 4 tablets (40 mEq total) by mouth 2 (two) times daily., Disp: 240 tablet, Rfl: 3 .  torsemide (DEMADEX) 20 MG tablet, Take 1-2 tablets (20-40 mg total) by mouth as directed. Take 40 mg in the AM and 20 mg in the PM, Disp: 90 tablet, Rfl: 6 .  metolazone (ZAROXOLYN) 2.5 MG tablet, Take 2.5 mg by mouth as needed (weight gain or shortness of breath). Take as directed, Disp: , Rfl:  .  OXYGEN, Inhale 2 L into the lungs at bedtime. cpap machine, Disp: , Rfl:  Allergies  Allergen Reactions  . Aspirin Hives and Rash  . Penicillins Rash and Other (See Comments)    Amoxicillin also, Has patient had a PCN reaction causing immediate rash, facial/tongue/throat swelling, SOB or lightheadedness with hypotension: Yes Has patient had a PCN reaction causing severe rash involving mucus membranes or skin necrosis: No Has patient had a PCN reaction that required hospitalization No Has patient had a PCN reaction occurring within the last 10 years: Yes If all of the above answers are "NO", then may proceed with Cephalosporin use.   . Tape Other (See Comments)    NO PAPER TAPE-MUST BE MEDICAL TAPE - unknown reaction  . Wool Alcohol [Lanolin] Hives  . Amoxicillin Rash  . Ampicillin Itching and Rash  Social History   Social History  . Marital status: Married    Spouse name: [name]  . Number of children: 4  . Years of education: 12   Occupational History  . disabled    Social History Main Topics  . Smoking status: Former Smoker    Packs/day: 1.00    Years: 7.00    Types: Cigarettes    Quit date: 03/16/1969  . Smokeless tobacco: Never Used  . Alcohol use No  . Drug use: No  . Sexual activity: Not Currently    Birth control/ protection:  Post-menopausal   Other Topics Concern  . Not on file   Social History Narrative   Lives with husband [name] lives with them as do his 2 children   Daughter, [name], and her 3 children live in [place] -  [name] is incarcerated   Nephew, Shantoya Geurts lives with them   Daughter, [name], in [name] [address]   Moved June 2013 to a better home on [facility].      Lives with husband, son, grandson, granddaughter, nephew.    Physical Exam      Future Appointments Date Time Provider Department Center  05/15/2017 9:15 AM Uvaldo Rising, MD [facility]  05/29/2017 11:00 AM [name], MD [name]  06/17/2017 1:00 PM MC ECHO 1-BUZZ [facility]  06/17/2017 2:00 PM [name], [name], MD [facility] None   BP 108/70   Pulse 60   Resp 16   Wt 157 lb 11.2 oz (71.5 kg)   SpO2 98%   BMI 30.80 kg/m  Weight yesterday-157.4  Last visit weight-155.4   Pt reports she is feeling good. She reports she does not have the money for savaysa-she has one day left-will see about getting her assistance for that and come out to finish her pill box.  She denies any dizziness, no sob. No h/a. No swelling noted.  meds verified and pill box refilled. No longer a [facility] pt due to her insurance change. She reports her appetite is good and her fluid intake is within 2L.   ACTION: Home visit completed  Kerry Hough, EMT-Paramedic 05/12/17

## 2017-05-13 ENCOUNTER — Other Ambulatory Visit: Payer: Self-pay | Admitting: Internal Medicine

## 2017-05-14 NOTE — Progress Notes (Signed)
   Subjective:    Patient ID: Misty Gray, female    DOB: 09-25-1951, 66 y.o.   MRN: [id number]  HPI 66 y/o female presents for routine follow up.  Chronic Venous Insufficiency Last seen in office on 03/18/17. Unna boots stopped at that time.  Improved from last visit, no current drainage  Systolic CHF Current medications include Torsemide 40 mg in AM and 20 mg in PM. Recently started on Losartan 12.5 mg daily by Cardiology (had to stop due to hypotension). SOB stable, more active per son, no chest pain. Able to sleep flat at night (one pillow).   Afib/Chronic Anticoagulation Currently prescribed Amiodarone 200 mg daily, recently started on Edoxaban per Cardiology (safe to use with Phenobarbital). Insurance not covering - working with cardiology to get approved.   Allergic Rhinitis Ran out of Flonase. Needs Refill.   HM Due for colonoscopy (previous blood in stool. Seen by GI who did not recommend colonoscopy given multiple medical problems).   Social Former Smoker   Review of Systems See Above.     Objective:   Physical Exam BP 106/67   Temp 97.7 F (36.5 C) (Oral)   Ht 5' (1.524 m)   Wt 157 lb 3.2 oz (71.3 kg)   SpO2 98%   BMI 30.70 kg/m   Gen: pleasant female, NAD Cardiac: Irregular Irregular rhythm, no murmur Resp: CTAB, normal effort Ext: varicose veins, 1+ edema, significantly improved from previous visits       Assessment & Plan:  Chronic systolic dysfunction of left ventricle Stable.  -no changes in therapy today.  -Continue Lasix 40 mg in AM and 20 mg in PM -unable to tolerate ACE-I due to hypotension  Chronic venous insufficiency Improved. No need for Unna boots today.   Allergic rhinitis Refill of Flonase provided.   Chronic anticoagulation Transitioned to Enoxaban per Cardiology. No need for further INR checks at [facility].

## 2017-05-15 ENCOUNTER — Ambulatory Visit (INDEPENDENT_AMBULATORY_CARE_PROVIDER_SITE_OTHER): Payer: Medicare HMO | Admitting: Family Medicine

## 2017-05-15 ENCOUNTER — Encounter: Payer: Self-pay | Admitting: Family Medicine

## 2017-05-15 DIAGNOSIS — J309 Allergic rhinitis, unspecified: Secondary | ICD-10-CM | POA: Diagnosis not present

## 2017-05-15 DIAGNOSIS — I872 Venous insufficiency (chronic) (peripheral): Secondary | ICD-10-CM

## 2017-05-15 DIAGNOSIS — I519 Heart disease, unspecified: Secondary | ICD-10-CM | POA: Diagnosis not present

## 2017-05-15 DIAGNOSIS — Z7901 Long term (current) use of anticoagulants: Secondary | ICD-10-CM

## 2017-05-15 MED ORDER — FLUTICASONE PROPIONATE 50 MCG/ACT NA SUSP: 2.0000 | Freq: Every day | NASAL | 1 refills | Status: DC | PRN

## 2017-05-15 NOTE — Assessment & Plan Note (Signed)
Refill of Flonase provided 

## 2017-05-15 NOTE — Assessment & Plan Note (Signed)
Improved. No need for Unna boots today.

## 2017-05-15 NOTE — Assessment & Plan Note (Signed)
Transitioned to Enoxaban per Cardiology. No need for further INR checks at [facility].

## 2017-05-15 NOTE — Patient Instructions (Signed)
It was nice to see you today.   Please continue your current medications. No Changes today.

## 2017-05-15 NOTE — Assessment & Plan Note (Signed)
Stable.  -no changes in therapy today.  -Continue Lasix 40 mg in AM and 20 mg in PM -unable to tolerate ACE-I due to hypotension

## 2017-05-16 ENCOUNTER — Other Ambulatory Visit: Payer: Self-pay | Admitting: Pharmacist

## 2017-05-16 DIAGNOSIS — I279 Pulmonary heart disease, unspecified: Secondary | ICD-10-CM | POA: Diagnosis not present

## 2017-05-16 DIAGNOSIS — G4733 Obstructive sleep apnea (adult) (pediatric): Secondary | ICD-10-CM | POA: Diagnosis not present

## 2017-05-16 DIAGNOSIS — R0902 Hypoxemia: Secondary | ICD-10-CM | POA: Diagnosis not present

## 2017-05-16 DIAGNOSIS — Q871 Congenital malformation syndromes predominantly associated with short stature: Secondary | ICD-10-CM | POA: Diagnosis not present

## 2017-05-16 LAB — BASIC METABOLIC PANEL
BUN/Creatinine Ratio: 28 (ref 12–28)
BUN: 23 mg/dL (ref 8–27)
CALCIUM: 10.1 mg/dL (ref 8.7–10.3)
CO2: 25 mmol/L (ref 18–29)
CREATININE: 0.83 mg/dL (ref 0.57–1.00)
Chloride: 102 mmol/L (ref 96–106)
GFR calc Af Amer: 84 mL/min/{1.73_m2} (ref >59)
GFR, EST NON AFRICAN AMERICAN: 73 mL/min/{1.73_m2} (ref >59)
GLUCOSE: 92 mg/dL (ref 65–99)
POTASSIUM: 4 mmol/L (ref 3.5–5.2)
Sodium: 143 mmol/L (ref 134–144)

## 2017-05-16 NOTE — Patient Outreach (Signed)
[facility]) Care Management  05/16/2017  LARYAH ZUCHOWSKI 1951-02-04 [id number]  66 y.o. year old female referred to [facility] pharmacy for medication assistance.  Per referral Patient is unable to consistently afford medications. Has been receiving financial assistance from heart failure clinic. [facility] RN states patient is having difficutly budgeting for her medications.  Per chart review her [name] is likely her most expensive medication.    Patient has been notified by [facility] RN she is not eligible for [facility] services due to insurance change.    Plan: [facility] will not open case as patient is not eligible for [facility] services.   Will contact [facility] pharmacist for medication assistance. Patient may be eligible for the [employer] copay assistance program or other heart failure clinic medication assistance options.  Will forward this note to Elizabeth Palau, PharmD  Hazle Nordmann, PharmD, BCPS [facility] PGY2 Pharmacy Resident [phone]

## 2017-05-19 ENCOUNTER — Other Ambulatory Visit (HOSPITAL_COMMUNITY): Payer: Self-pay

## 2017-05-19 ENCOUNTER — Other Ambulatory Visit: Payer: Self-pay | Admitting: Family Medicine

## 2017-05-19 ENCOUNTER — Encounter: Payer: Self-pay | Admitting: Family Medicine

## 2017-05-19 NOTE — Progress Notes (Signed)
Paramedicine Encounter    Patient ID: Misty Gray, female    DOB: 16-May-1951, 66 y.o.   MRN: [id number]   Patient Care Team: Uvaldo Rising, MD as PCP - General (Family Medicine) [name], MD (Cardiology)  Patient Active Problem List   Diagnosis Date Noted  . Chronic venous stasis dermatitis 12/26/2016  . Urinary tract infection without hematuria   . Frequent PVCs   . Hypokalemia   . Housing problems 11/23/2016  . Chronic atrial fibrillation (HCC) 11/01/2016  . Hypotension 10/31/2016  . Special screening for malignant neoplasms, colon 08/12/2016  . Heme positive stool 08/09/2016  . Intertrigo 08/09/2016  . Chronic anticoagulation 04/26/2016  . Chronic venous insufficiency   . Preventative health care 10/05/2014  . Breast mass, left 10/05/2014  . Osteopenia 09/15/2014  . Hypertension 01/04/2014  . Chronic systolic dysfunction of left ventricle 05/05/2013  . At high risk for falls 02/11/2013  . Vitamin D deficiency 07/15/2012  . Sinoatrial node dysfunction (HCC) 05/08/2011  . Allergic rhinitis 05/31/2008  . Morbid obesity (HCC) 02/12/2007  . GASTROESOPHAGEAL REFLUX, NO ESOPHAGITIS 02/12/2007  . Female stress incontinence 02/12/2007  . Seizure disorder (HCC) 02/12/2007  . OSA (obstructive sleep apnea) 02/12/2007    Current Outpatient Prescriptions:  .  amiodarone (PACERONE) 200 MG tablet, Take 1 tablet (200 mg total) by mouth daily., Disp: 30 tablet, Rfl: 2 .  cholecalciferol (VITAMIN D) 1000 units tablet, Take 1 tablet (1,000 Units total) by mouth daily., Disp: 90 tablet, Rfl: 3 .  edoxaban (SAVAYSA) 60 MG TABS tablet, Take 60 mg by mouth daily., Disp: 30 tablet, Rfl: 11 .  omeprazole (PRILOSEC) 20 MG capsule, TAKE ONE CAPSULE BY MOUTH EVERY DAY, Disp: 90 capsule, Rfl: 1 .  PHENobarbital (LUMINAL) 32.4 MG tablet, Take 2 tablets (64.8 mg total) by mouth 2 (two) times daily., Disp: 120 tablet, Rfl: 5 .  potassium chloride (K-DUR,KLOR-CON) 10 MEQ tablet, Take 4 tablets  (40 mEq total) by mouth 2 (two) times daily., Disp: 240 tablet, Rfl: 3 .  torsemide (DEMADEX) 20 MG tablet, Take 1-2 tablets (20-40 mg total) by mouth as directed. Take 40 mg in the AM and 20 mg in the PM, Disp: 90 tablet, Rfl: 6 .  fluticasone (FLONASE) 50 MCG/ACT nasal spray, Place 2 sprays into both nostrils daily as needed. For congestion. (Patient not taking: Reported on 05/19/2017), Disp: 16 g, Rfl: 1 .  metolazone (ZAROXOLYN) 2.5 MG tablet, Take 2.5 mg by mouth as needed (weight gain or shortness of breath). Take as directed, Disp: , Rfl:  .  OXYGEN, Inhale 2 L into the lungs at bedtime. cpap machine, Disp: , Rfl:  Allergies  Allergen Reactions  . Aspirin Hives and Rash  . Penicillins Rash and Other (See Comments)    Amoxicillin also, Has patient had a PCN reaction causing immediate rash, facial/tongue/throat swelling, SOB or lightheadedness with hypotension: Yes Has patient had a PCN reaction causing severe rash involving mucus membranes or skin necrosis: No Has patient had a PCN reaction that required hospitalization No Has patient had a PCN reaction occurring within the last 10 years: Yes If all of the above answers are "NO", then may proceed with Cephalosporin use.   . Tape Other (See Comments)    NO PAPER TAPE-MUST BE MEDICAL TAPE - unknown reaction  . Wool Alcohol [Lanolin] Hives  . Amoxicillin Rash  . Ampicillin Itching and Rash     Social History   Social History  . Marital status: Married  Spouse name: [name]  . Number of children: 4  . Years of education: 12   Occupational History  . disabled    Social History Main Topics  . Smoking status: Former Smoker    Packs/day: 1.00    Years: 7.00    Types: Cigarettes    Quit date: 03/16/1969  . Smokeless tobacco: Never Used  . Alcohol use No  . Drug use: No  . Sexual activity: Not Currently    Birth control/ protection: Post-menopausal   Other Topics Concern  . Not on file   Social History Narrative   Lives  with husband [name] lives with them as do his 2 children   Daughter, [name], and her 3 children live in [place] -  [name] is incarcerated   Nephew, Shirla Hodgkiss lives with them   Daughter, [name], in [name] [address]   Moved June 2013 to a better home on [facility].      Lives with husband, son, grandson, granddaughter, nephew.    Physical Exam      Future Appointments Date Time Provider Department Center  05/29/2017 11:00 AM [name], MD [name]  06/17/2017 1:00 PM MC ECHO 1-BUZZ MC[facility]ECHOL[facility]  06/17/2017 2:00 PM [name], [name], MD [facility] None   BP 100/72   Pulse 60   Resp 16   Wt 156 lb (70.8 kg)   SpO2 98%   BMI 30.47 kg/m  Weight yesterday-156.8  Last visit weight-157  Pt reports that she is feeling pretty good, she denies any sob, no missed doses this week.  She needs amio for mon am--we called pharmacy to see how much she needs a month for all her meds and working with her on her budget since she has needed help the past couple times for her  meds--pharmacy advised that  3.35 per med $8 for savaysa. So total cost around $31.50 per month that is including flonase--right now she only has $20 to put in envelope for her medicine money. So I will go get her amio and come back to place it in her pill box.   ACTION: Home visit completed  Kerry Hough, EMT-Paramedic 05/19/17

## 2017-05-20 ENCOUNTER — Telehealth: Payer: Self-pay | Admitting: Licensed Clinical Social Worker

## 2017-05-20 ENCOUNTER — Telehealth (HOSPITAL_COMMUNITY): Payer: Self-pay | Admitting: Pharmacist

## 2017-05-20 NOTE — Telephone Encounter (Signed)
Received message from [facility] pharmacist and paramedic about Misty Gray's ability to afford her medications. She has [employer] Part D coverage so she is not eligible for our HF fund. Additionally, her medication copays are very low (Savaysa only $8/mo) so she would not be eligible for patient assistance. I have sent a message to our SW, Misty Gray to follow up with patient about appropriate budgeting for her medications.   [name]. Bonnye Fava, PharmD, BCPS, CPP Clinical Pharmacist Pager: [phone] Phone: [phone] 05/20/2017 11:08 AM

## 2017-05-20 NOTE — Telephone Encounter (Signed)
Rx called into patient pharmacy. 

## 2017-05-20 NOTE — Telephone Encounter (Signed)
CSW referred to contact patient regarding finances for medications. Patient told Pharmacy she only can allot $20 per month for medications but current cost is $31.50. CSW discussed with paramedic Katie who fills med box for patient and confirmed patient is putting aside $20 although states based on refill schedule patient should be fine for payment of medications. CSW will continue to coordinate with paramedicine for medication compliance. Lasandra Beech, LCSW, CCSW-MCS [phone]

## 2017-05-20 NOTE — Telephone Encounter (Signed)
Please call in Phenobarbitol 32.4 mg, 2 tablets every 12 hours, dispense #120, refill #5, thanks

## 2017-05-26 ENCOUNTER — Other Ambulatory Visit (HOSPITAL_COMMUNITY): Payer: Self-pay

## 2017-05-26 NOTE — Progress Notes (Signed)
Paramedicine Encounter    Patient ID: Misty Gray, female    DOB: December 16, 1951, 66 y.o.   MRN: [id number]   Patient Care Team: Uvaldo Rising, MD as PCP - General (Family Medicine) [name], MD (Cardiology)  Patient Active Problem List   Diagnosis Date Noted  . Chronic venous stasis dermatitis 12/26/2016  . Urinary tract infection without hematuria   . Frequent PVCs   . Hypokalemia   . Housing problems 11/23/2016  . Chronic atrial fibrillation (HCC) 11/01/2016  . Hypotension 10/31/2016  . Special screening for malignant neoplasms, colon 08/12/2016  . Heme positive stool 08/09/2016  . Intertrigo 08/09/2016  . Chronic anticoagulation 04/26/2016  . Chronic venous insufficiency   . Preventative health care 10/05/2014  . Breast mass, left 10/05/2014  . Osteopenia 09/15/2014  . Hypertension 01/04/2014  . Chronic systolic dysfunction of left ventricle 05/05/2013  . At high risk for falls 02/11/2013  . Vitamin D deficiency 07/15/2012  . Sinoatrial node dysfunction (HCC) 05/08/2011  . Allergic rhinitis 05/31/2008  . Morbid obesity (HCC) 02/12/2007  . GASTROESOPHAGEAL REFLUX, NO ESOPHAGITIS 02/12/2007  . Female stress incontinence 02/12/2007  . Seizure disorder (HCC) 02/12/2007  . OSA (obstructive sleep apnea) 02/12/2007    Current Outpatient Prescriptions:  .  amiodarone (PACERONE) 200 MG tablet, Take 1 tablet (200 mg total) by mouth daily., Disp: 30 tablet, Rfl: 2 .  cholecalciferol (VITAMIN D) 1000 units tablet, Take 1 tablet (1,000 Units total) by mouth daily., Disp: 90 tablet, Rfl: 3 .  edoxaban (SAVAYSA) 60 MG TABS tablet, Take 60 mg by mouth daily., Disp: 30 tablet, Rfl: 11 .  fluticasone (FLONASE) 50 MCG/ACT nasal spray, Place 2 sprays into both nostrils daily as needed. For congestion., Disp: 16 g, Rfl: 1 .  omeprazole (PRILOSEC) 20 MG capsule, TAKE ONE CAPSULE BY MOUTH EVERY DAY, Disp: 90 capsule, Rfl: 1 .  PHENobarbital (LUMINAL) 32.4 MG tablet, Take 2 tablets (64.8  mg total) by mouth 2 (two) times daily., Disp: 120 tablet, Rfl: 5 .  potassium chloride (K-DUR,KLOR-CON) 10 MEQ tablet, Take 4 tablets (40 mEq total) by mouth 2 (two) times daily., Disp: 240 tablet, Rfl: 3 .  torsemide (DEMADEX) 20 MG tablet, Take 1-2 tablets (20-40 mg total) by mouth as directed. Take 40 mg in the AM and 20 mg in the PM, Disp: 90 tablet, Rfl: 6 .  metolazone (ZAROXOLYN) 2.5 MG tablet, Take 2.5 mg by mouth as needed (weight gain or shortness of breath). Take as directed, Disp: , Rfl:  .  OXYGEN, Inhale 2 L into the lungs at bedtime. cpap machine, Disp: , Rfl:  Allergies  Allergen Reactions  . Aspirin Hives and Rash  . Penicillins Rash and Other (See Comments)    Amoxicillin also, Has patient had a PCN reaction causing immediate rash, facial/tongue/throat swelling, SOB or lightheadedness with hypotension: Yes Has patient had a PCN reaction causing severe rash involving mucus membranes or skin necrosis: No Has patient had a PCN reaction that required hospitalization No Has patient had a PCN reaction occurring within the last 10 years: Yes If all of the above answers are "NO", then may proceed with Cephalosporin use.   . Tape Other (See Comments)    NO PAPER TAPE-MUST BE MEDICAL TAPE - unknown reaction  . Wool Alcohol [Lanolin] Hives  . Amoxicillin Rash  . Ampicillin Itching and Rash     Social History   Social History  . Marital status: Married    Spouse name: [name]  . Number  of children: 4  . Years of education: 12   Occupational History  . disabled    Social History Main Topics  . Smoking status: Former Smoker    Packs/day: 1.00    Years: 7.00    Types: Cigarettes    Quit date: 03/16/1969  . Smokeless tobacco: Never Used  . Alcohol use No  . Drug use: No  . Sexual activity: Not Currently    Birth control/ protection: Post-menopausal   Other Topics Concern  . Not on file   Social History Narrative   Lives with husband [name]  lives with them as do his 2 children   Daughter, [name], and her 3 children live in [place] -  [name] is incarcerated   Nephew, Allannah Goodheart lives with them   Daughter, [name], in [name] [address]   Moved June 2013 to a better home on [facility].      Lives with husband, son, grandson, granddaughter, nephew.    Physical Exam      Future Appointments Date Time Provider Department Center  05/29/2017 11:00 AM [name], MD [name]  06/17/2017 1:00 PM MC ECHO 1-BUZZ [facility]  06/17/2017 2:00 PM [name], [name], MD [facility] None   BP 104/64   Pulse (!) 58   Resp 15   Wt 156 lb 12.8 oz (71.1 kg)   SpO2 97%   BMI 30.62 kg/m  Weight yesterday-156.8 Last visit weight-156  Pt reports that she is feeling good, she has phenobarbitol in through fri pm dose. Pharmacy will fill it tomor and I will pick it up for her.  Pt denies any sob, no dizziness. No swelling noted. meds verified and pill box refilled. No missed doses of her meds.   ACTION: Home visit completed  Kerry Hough, EMT-Paramedic 05/26/17

## 2017-05-29 ENCOUNTER — Encounter: Payer: Medicare HMO | Admitting: Internal Medicine

## 2017-05-30 ENCOUNTER — Other Ambulatory Visit (HOSPITAL_COMMUNITY): Payer: Self-pay

## 2017-05-30 NOTE — Progress Notes (Signed)
Paramedicine Encounter    Patient ID: [name], female    DOB: 06-12-1951, 66 y.o.   MRN: [id number]   Patient Care Team: Uvaldo Rising, MD as PCP - General (Family Medicine) [name], MD (Cardiology)  Patient Active Problem List   Diagnosis Date Noted  . Chronic venous stasis dermatitis 12/26/2016  . Urinary tract infection without hematuria   . Frequent PVCs   . Hypokalemia   . Housing problems 11/23/2016  . Chronic atrial fibrillation (HCC) 11/01/2016  . Hypotension 10/31/2016  . Special screening for malignant neoplasms, colon 08/12/2016  . Heme positive stool 08/09/2016  . Intertrigo 08/09/2016  . Chronic anticoagulation 04/26/2016  . Chronic venous insufficiency   . Preventative health care 10/05/2014  . Breast mass, left 10/05/2014  . Osteopenia 09/15/2014  . Hypertension 01/04/2014  . Chronic systolic dysfunction of left ventricle 05/05/2013  . At high risk for falls 02/11/2013  . Vitamin D deficiency 07/15/2012  . Sinoatrial node dysfunction (HCC) 05/08/2011  . Allergic rhinitis 05/31/2008  . Morbid obesity (HCC) 02/12/2007  . GASTROESOPHAGEAL REFLUX, NO ESOPHAGITIS 02/12/2007  . Female stress incontinence 02/12/2007  . Seizure disorder (HCC) 02/12/2007  . OSA (obstructive sleep apnea) 02/12/2007    Current Outpatient Prescriptions:  .  amiodarone (PACERONE) 200 MG tablet, Take 1 tablet (200 mg total) by mouth daily., Disp: 30 tablet, Rfl: 2 .  cholecalciferol (VITAMIN D) 1000 units tablet, Take 1 tablet (1,000 Units total) by mouth daily., Disp: 90 tablet, Rfl: 3 .  edoxaban (SAVAYSA) 60 MG TABS tablet, Take 60 mg by mouth daily., Disp: 30 tablet, Rfl: 11 .  fluticasone (FLONASE) 50 MCG/ACT nasal spray, Place 2 sprays into both nostrils daily as needed. For congestion., Disp: 16 g, Rfl: 1 .  metolazone (ZAROXOLYN) 2.5 MG tablet, Take 2.5 mg by mouth as needed (weight gain or shortness of breath). Take as directed, Disp: , Rfl:  .  omeprazole (PRILOSEC)  20 MG capsule, TAKE ONE CAPSULE BY MOUTH EVERY DAY, Disp: 90 capsule, Rfl: 1 .  OXYGEN, Inhale 2 L into the lungs at bedtime. cpap machine, Disp: , Rfl:  .  PHENobarbital (LUMINAL) 32.4 MG tablet, Take 2 tablets (64.8 mg total) by mouth 2 (two) times daily., Disp: 120 tablet, Rfl: 5 .  potassium chloride (K-DUR,KLOR-CON) 10 MEQ tablet, Take 4 tablets (40 mEq total) by mouth 2 (two) times daily., Disp: 240 tablet, Rfl: 3 .  torsemide (DEMADEX) 20 MG tablet, Take 1-2 tablets (20-40 mg total) by mouth as directed. Take 40 mg in the AM and 20 mg in the PM, Disp: 90 tablet, Rfl: 6 Allergies  Allergen Reactions  . Aspirin Hives and Rash  . Penicillins Rash and Other (See Comments)    Amoxicillin also, Has patient had a PCN reaction causing immediate rash, facial/tongue/throat swelling, SOB or lightheadedness with hypotension: Yes Has patient had a PCN reaction causing severe rash involving mucus membranes or skin necrosis: No Has patient had a PCN reaction that required hospitalization No Has patient had a PCN reaction occurring within the last 10 years: Yes If all of the above answers are "NO", then may proceed with Cephalosporin use.   . Tape Other (See Comments)    NO PAPER TAPE-MUST BE MEDICAL TAPE - unknown reaction  . Wool Alcohol [Lanolin] Hives  . Amoxicillin Rash  . Ampicillin Itching and Rash     Social History   Social History  . Marital status: Married    Spouse name: [name]  . Number  of children: 4  . Years of education: 12   Occupational History  . disabled    Social History Main Topics  . Smoking status: Former Smoker    Packs/day: 1.00    Years: 7.00    Types: Cigarettes    Quit date: 03/16/1969  . Smokeless tobacco: Never Used  . Alcohol use No  . Drug use: No  . Sexual activity: Not Currently    Birth control/ protection: Post-menopausal   Other Topics Concern  . Not on file   Social History Narrative   Lives with husband [name] lives  with them as do his 2 children   Daughter, [name], and her 3 children live in [place] -  [name] is incarcerated   Nephew, Tiasha Helvie lives with them   Daughter, [name], in [name] [address]   Moved June 2013 to a better home on [facility].      Lives with husband, son, grandson, granddaughter, nephew.    Physical Exam  Constitutional: She is oriented to person, place, and time. She appears well-developed.  Neck: Normal range of motion.  Cardiovascular: Normal rate and regular rhythm.   Pulmonary/Chest: Effort normal and breath sounds normal. No respiratory distress. She has no wheezes. She has no rales.  Abdominal: Soft.  Musculoskeletal: Normal range of motion. She exhibits no edema.  Neurological: She is alert and oriented to person, place, and time.  Skin: Skin is warm and dry.        Future Appointments Date Time Provider Department Center  06/17/2017 1:00 PM MC ECHO 1-BUZZ [facility]  06/17/2017 2:00 PM [name], [name], MD [facility] None   BP 94/60 (BP Location: Left Arm, Patient Position: Sitting, Cuff Size: Normal)   Pulse 64   Resp 16   Wt 156 lb 12.8 oz (71.1 kg)   SpO2 97%   BMI 30.62 kg/m  Weight yesterday- 152.8 lbs Last visit weight- 156.8 lbs  Ms Massimo was seen at home today and reports feeling well. She denies SOB, H/A or dizziness and is still using 2 pillows to sleep at night. She has been taking all of her medications as prescribed. These medications were verified and placed in her pillbox. She had a weight gain of 4 pounds overnight but was not symptomatic. Megan at the clinic was contacted and advised that no action was immediately necessary but to call the clinic if she began having any issues this weekend.   Jacqualine Code, EMT 05/30/17  ACTION: Home visit completed Next visit planned for 1 week

## 2017-06-01 ENCOUNTER — Other Ambulatory Visit: Payer: Self-pay | Admitting: Internal Medicine

## 2017-06-06 ENCOUNTER — Other Ambulatory Visit (HOSPITAL_COMMUNITY): Payer: Self-pay

## 2017-06-06 NOTE — Progress Notes (Signed)
Paramedicine Encounter    Patient ID: Misty Gray, female    DOB: 09-26-1951, 66 y.o.   MRN: [id number]   Patient Care Team: Latrelle Dodrill, MD as PCP - General (Family Medicine) [name], MD (Cardiology)  Patient Active Problem List   Diagnosis Date Noted  . Chronic venous stasis dermatitis 12/26/2016  . Urinary tract infection without hematuria   . Frequent PVCs   . Hypokalemia   . Housing problems 11/23/2016  . Chronic atrial fibrillation (HCC) 11/01/2016  . Hypotension 10/31/2016  . Special screening for malignant neoplasms, colon 08/12/2016  . Heme positive stool 08/09/2016  . Intertrigo 08/09/2016  . Chronic anticoagulation 04/26/2016  . Chronic venous insufficiency   . Preventative health care 10/05/2014  . Breast mass, left 10/05/2014  . Osteopenia 09/15/2014  . Hypertension 01/04/2014  . Chronic systolic dysfunction of left ventricle 05/05/2013  . At high risk for falls 02/11/2013  . Vitamin D deficiency 07/15/2012  . Sinoatrial node dysfunction (HCC) 05/08/2011  . Allergic rhinitis 05/31/2008  . Morbid obesity (HCC) 02/12/2007  . GASTROESOPHAGEAL REFLUX, NO ESOPHAGITIS 02/12/2007  . Female stress incontinence 02/12/2007  . Seizure disorder (HCC) 02/12/2007  . OSA (obstructive sleep apnea) 02/12/2007    Current Outpatient Prescriptions:  .  amiodarone (PACERONE) 200 MG tablet, Take 1 tablet (200 mg total) by mouth daily., Disp: 30 tablet, Rfl: 2 .  cholecalciferol (VITAMIN D) 1000 units tablet, Take 1 tablet (1,000 Units total) by mouth daily., Disp: 90 tablet, Rfl: 3 .  edoxaban (SAVAYSA) 60 MG TABS tablet, Take 60 mg by mouth daily., Disp: 30 tablet, Rfl: 11 .  fluticasone (FLONASE) 50 MCG/ACT nasal spray, Place 2 sprays into both nostrils daily as needed. For congestion., Disp: 16 g, Rfl: 1 .  metolazone (ZAROXOLYN) 2.5 MG tablet, Take 2.5 mg by mouth as needed (weight gain or shortness of breath). Take as directed, Disp: , Rfl:  .  omeprazole  (PRILOSEC) 20 MG capsule, TAKE ONE CAPSULE BY MOUTH EVERY DAY, Disp: 90 capsule, Rfl: 1 .  PHENobarbital (LUMINAL) 32.4 MG tablet, Take 2 tablets (64.8 mg total) by mouth 2 (two) times daily., Disp: 120 tablet, Rfl: 5 .  potassium chloride (K-DUR,KLOR-CON) 10 MEQ tablet, Take 4 tablets (40 mEq total) by mouth 2 (two) times daily., Disp: 240 tablet, Rfl: 3 .  torsemide (DEMADEX) 20 MG tablet, Take 1-2 tablets (20-40 mg total) by mouth as directed. Take 40 mg in the AM and 20 mg in the PM, Disp: 90 tablet, Rfl: 6 .  OXYGEN, Inhale 2 L into the lungs at bedtime. cpap machine, Disp: , Rfl:  Allergies  Allergen Reactions  . Aspirin Hives and Rash  . Penicillins Rash and Other (See Comments)    Amoxicillin also, Has patient had a PCN reaction causing immediate rash, facial/tongue/throat swelling, SOB or lightheadedness with hypotension: Yes Has patient had a PCN reaction causing severe rash involving mucus membranes or skin necrosis: No Has patient had a PCN reaction that required hospitalization No Has patient had a PCN reaction occurring within the last 10 years: Yes If all of the above answers are "NO", then may proceed with Cephalosporin use.   . Tape Other (See Comments)    NO PAPER TAPE-MUST BE MEDICAL TAPE - unknown reaction  . Wool Alcohol [Lanolin] Hives  . Amoxicillin Rash  . Ampicillin Itching and Rash     Social History   Social History  . Marital status: Married    Spouse name: [name]  . Number  of children: 4  . Years of education: 12   Occupational History  . disabled    Social History Main Topics  . Smoking status: Former Smoker    Packs/day: 1.00    Years: 7.00    Types: Cigarettes    Quit date: 03/16/1969  . Smokeless tobacco: Never Used  . Alcohol use No  . Drug use: No  . Sexual activity: Not Currently    Birth control/ protection: Post-menopausal   Other Topics Concern  . Not on file   Social History Narrative   Lives with husband [name] lives with them as do his 2 children   Daughter, [name], and her 3 children live in [place] -  [name] is incarcerated   Nephew, Andreia Gandolfi lives with them   Daughter, [name], in [name] [address]   Moved June 2013 to a better home on [facility].      Lives with husband, son, grandson, granddaughter, nephew.    Physical Exam  Constitutional: She is oriented to person, place, and time. She appears well-developed.  Neck: Normal range of motion.  Cardiovascular: Normal rate.   Pulmonary/Chest: Effort normal and breath sounds normal. No respiratory distress. She has no wheezes. She has no rales.  Abdominal: Soft. She exhibits no distension.  Musculoskeletal: Normal range of motion. She exhibits no edema.  Neurological: She is alert and oriented to person, place, and time.  Skin: Skin is warm and dry.  Psychiatric: She has a normal mood and affect.        Future Appointments Date Time Provider Department Center  06/17/2017 1:00 PM MC ECHO 1-BUZZ [facility]  06/17/2017 2:00 PM [name], [name], MD [facility] None   BP 118/64 (BP Location: Left Arm, Patient Position: Sitting, Cuff Size: Normal)   Pulse (!) 58   Resp 16   Wt 152 lb 6.4 oz (69.1 kg)   SpO2 98%   BMI 29.76 kg/m  Weight yesterday- 156.8 lbs Last visit weight- 156.8 lbs  Savaysa is not covered by her insurance unless it is the generic form but she has it in her pillbox through Thursday morning. Potassium is in her pillbox through the weekend and she can not afford to pick up until the first of the month. Torsemide is in her pillbox through Monday but not after and cannot refill for the aforementioned reason. I wIll discuss a solution to this issue with Katie on Monday. All other medications were placed in her pillbox. She reported feeling well, denying SOB, H/A or dizziness.    Jacqualine Code, EMT 06/06/17  ACTION: Home visit completed Next visit planned for  1 week

## 2017-06-12 ENCOUNTER — Other Ambulatory Visit (HOSPITAL_COMMUNITY): Payer: Self-pay

## 2017-06-12 ENCOUNTER — Telehealth (HOSPITAL_COMMUNITY): Payer: Self-pay | Admitting: *Deleted

## 2017-06-12 NOTE — Progress Notes (Signed)
Paramedicine Encounter    Patient ID: Misty Gray, female    DOB: 10-Apr-1951, 66 y.o.   MRN: [id number]   Patient Care Team: Latrelle Dodrill, MD as PCP - General (Family Medicine) [name], MD (Cardiology)  Patient Active Problem List   Diagnosis Date Noted  . Chronic venous stasis dermatitis 12/26/2016  . Urinary tract infection without hematuria   . Frequent PVCs   . Hypokalemia   . Housing problems 11/23/2016  . Chronic atrial fibrillation (HCC) 11/01/2016  . Hypotension 10/31/2016  . Special screening for malignant neoplasms, colon 08/12/2016  . Heme positive stool 08/09/2016  . Intertrigo 08/09/2016  . Chronic anticoagulation 04/26/2016  . Chronic venous insufficiency   . Preventative health care 10/05/2014  . Breast mass, left 10/05/2014  . Osteopenia 09/15/2014  . Hypertension 01/04/2014  . Chronic systolic dysfunction of left ventricle 05/05/2013  . At high risk for falls 02/11/2013  . Vitamin D deficiency 07/15/2012  . Sinoatrial node dysfunction (HCC) 05/08/2011  . Allergic rhinitis 05/31/2008  . Morbid obesity (HCC) 02/12/2007  . GASTROESOPHAGEAL REFLUX, NO ESOPHAGITIS 02/12/2007  . Female stress incontinence 02/12/2007  . Seizure disorder (HCC) 02/12/2007  . OSA (obstructive sleep apnea) 02/12/2007    Current Outpatient Prescriptions:  .  amiodarone (PACERONE) 200 MG tablet, Take 1 tablet (200 mg total) by mouth daily., Disp: 30 tablet, Rfl: 2 .  cholecalciferol (VITAMIN D) 1000 units tablet, Take 1 tablet (1,000 Units total) by mouth daily., Disp: 90 tablet, Rfl: 3 .  edoxaban (SAVAYSA) 60 MG TABS tablet, Take 60 mg by mouth daily., Disp: 30 tablet, Rfl: 11 .  fluticasone (FLONASE) 50 MCG/ACT nasal spray, Place 2 sprays into both nostrils daily as needed. For congestion., Disp: 16 g, Rfl: 1 .  omeprazole (PRILOSEC) 20 MG capsule, TAKE ONE CAPSULE BY MOUTH EVERY DAY, Disp: 90 capsule, Rfl: 1 .  PHENobarbital (LUMINAL) 32.4 MG tablet, Take 2 tablets  (64.8 mg total) by mouth 2 (two) times daily., Disp: 120 tablet, Rfl: 5 .  potassium chloride (K-DUR,KLOR-CON) 10 MEQ tablet, Take 4 tablets (40 mEq total) by mouth 2 (two) times daily., Disp: 240 tablet, Rfl: 3 .  torsemide (DEMADEX) 20 MG tablet, Take 1-2 tablets (20-40 mg total) by mouth as directed. Take 40 mg in the AM and 20 mg in the PM, Disp: 90 tablet, Rfl: 6 .  metolazone (ZAROXOLYN) 2.5 MG tablet, Take 2.5 mg by mouth as needed (weight gain or shortness of breath). Take as directed, Disp: , Rfl:  .  OXYGEN, Inhale 2 L into the lungs at bedtime. cpap machine, Disp: , Rfl:  Allergies  Allergen Reactions  . Aspirin Hives and Rash  . Penicillins Rash and Other (See Comments)    Amoxicillin also, Has patient had a PCN reaction causing immediate rash, facial/tongue/throat swelling, SOB or lightheadedness with hypotension: Yes Has patient had a PCN reaction causing severe rash involving mucus membranes or skin necrosis: No Has patient had a PCN reaction that required hospitalization No Has patient had a PCN reaction occurring within the last 10 years: Yes If all of the above answers are "NO", then may proceed with Cephalosporin use.   . Tape Other (See Comments)    NO PAPER TAPE-MUST BE MEDICAL TAPE - unknown reaction  . Wool Alcohol [Lanolin] Hives  . Amoxicillin Rash  . Ampicillin Itching and Rash     Social History   Social History  . Marital status: Married    Spouse name: [name]  . Number  of children: 4  . Years of education: 12   Occupational History  . disabled    Social History Main Topics  . Smoking status: Former Smoker    Packs/day: 1.00    Years: 7.00    Types: Cigarettes    Quit date: 03/16/1969  . Smokeless tobacco: Never Used  . Alcohol use No  . Drug use: No  . Sexual activity: Not Currently    Birth control/ protection: Post-menopausal   Other Topics Concern  . Not on file   Social History Narrative   Lives with husband [name] lives with them as do his 2 children   Daughter, [name], and her 3 children live in [place] -  [name] is incarcerated   Nephew, Jeanean Hollett lives with them   Daughter, [name], in [name] [address]   Moved June 2013 to a better home on [facility].      Lives with husband, son, grandson, granddaughter, nephew.    Physical Exam      Future Appointments Date Time Provider Department Center  06/17/2017 1:00 PM MC ECHO 1-BUZZ [facility]  06/17/2017 2:00 PM [name], [name], MD [facility] None   BP (!) 98/50   Pulse 60   Resp 15   Wt 156 lb (70.8 kg)   SpO2 98%   BMI 30.47 kg/m  Weight yesterday-156 Last visit weight-152  Pt reports she is feeling ok, she has had no money to get her meds--she is out of her savaysa, torsemide, potassium, will relay this to clinic-she is due to go back to clinic next Tuesday- she reports a nose bleed last week,  [facility] so they can deliver and be sure she doesn't miss any doses of her meds due to lack of money-they are able to set up account for her when she doesn't have the money and able to deliver for free. She denies any dizziness, no pains, no sob. Pt has some distention to her abd and small amount of swelling to her legs-she has one dose of torsemide left-hopefully the pharmacy will get her meds transferred over and get them delivered out to her today. Left message on triage line about pts lack of meds and her weight gain-she is aware of what days to place the pills in and I will get zack to call her tomor to verify she got the meds delivered.    ACTION: Home visit completed  Kerry Hough, EMT-Paramedic 06/12/17

## 2017-06-12 NOTE — Telephone Encounter (Signed)
Katie with paramed called and left message on triage line saying patient has been out of her torsemide and potassium for 5 days now.  Pt unable to afford meds at this time.  BP is stable at 98/50 but has a 4 lb weight gain.  No other symptoms.    [name] has transferred medications to a different pharmacy and she will have Zach follow up with patient tomorrow to see if she has received her meds from the new pharmacy.

## 2017-06-13 ENCOUNTER — Other Ambulatory Visit (HOSPITAL_COMMUNITY): Payer: Self-pay | Admitting: *Deleted

## 2017-06-13 MED ORDER — AMIODARONE HCL 200 MG PO TABS: 200.0000 mg | ORAL_TABLET | Freq: Every day | ORAL | 3 refills | Status: DC

## 2017-06-13 MED ORDER — EDOXABAN TOSYLATE 60 MG PO TABS: 60.0000 mg | ORAL_TABLET | Freq: Every day | ORAL | 11 refills | Status: DC

## 2017-06-13 MED ORDER — POTASSIUM CHLORIDE CRYS ER 10 MEQ PO TBCR: 40.0000 meq | EXTENDED_RELEASE_TABLET | Freq: Two times a day (BID) | ORAL | 3 refills | Status: DC

## 2017-06-13 MED ORDER — TORSEMIDE 20 MG PO TABS: 20.0000 mg | ORAL_TABLET | ORAL | 6 refills | Status: DC

## 2017-06-15 DIAGNOSIS — R0902 Hypoxemia: Secondary | ICD-10-CM | POA: Diagnosis not present

## 2017-06-15 DIAGNOSIS — Q871 Congenital malformation syndromes predominantly associated with short stature: Secondary | ICD-10-CM | POA: Diagnosis not present

## 2017-06-15 DIAGNOSIS — G4733 Obstructive sleep apnea (adult) (pediatric): Secondary | ICD-10-CM | POA: Diagnosis not present

## 2017-06-15 DIAGNOSIS — I279 Pulmonary heart disease, unspecified: Secondary | ICD-10-CM | POA: Diagnosis not present

## 2017-06-17 ENCOUNTER — Encounter (HOSPITAL_COMMUNITY): Payer: Medicare HMO | Admitting: Internal Medicine

## 2017-06-17 ENCOUNTER — Ambulatory Visit (HOSPITAL_COMMUNITY): Admission: RE | Admit: 2017-06-17 | Payer: Medicare HMO | Source: Ambulatory Visit

## 2017-06-17 ENCOUNTER — Telehealth (HOSPITAL_COMMUNITY): Payer: Self-pay | Admitting: Pharmacist

## 2017-06-17 NOTE — Telephone Encounter (Signed)
Savaysa PA approved by [employer] through 12/15/17.   Misty Gray. Bonnye Fava, PharmD, BCPS, CPP Clinical Pharmacist Pager: [phone] Phone: [phone] 06/17/2017 10:41 AM

## 2017-06-19 ENCOUNTER — Telehealth (HOSPITAL_COMMUNITY): Payer: Self-pay

## 2017-06-19 ENCOUNTER — Other Ambulatory Visit (HOSPITAL_COMMUNITY): Payer: Self-pay

## 2017-06-19 NOTE — Telephone Encounter (Signed)
Received VM from Kerry Hough with [facility] Paramedicine program reporting patient had 7 lb weight gain overnight after indulging in 4th of July cookout with high sodium foods and soda. Left return VM to patient and [name] advising to take her PRN metolazone 2.5 mg tablet once today and call us tomorrow with update on weight.  Ave Filter, RN

## 2017-06-19 NOTE — Progress Notes (Signed)
Paramedicine Encounter    Patient ID: Misty Gray, female    DOB: 1951-05-10, 66 y.o.   MRN: [id number]   Patient Care Team: Latrelle Dodrill, MD as PCP - General (Family Medicine) [name], MD (Cardiology)  Patient Active Problem List   Diagnosis Date Noted  . Chronic venous stasis dermatitis 12/26/2016  . Urinary tract infection without hematuria   . Frequent PVCs   . Hypokalemia   . Housing problems 11/23/2016  . Chronic atrial fibrillation (HCC) 11/01/2016  . Hypotension 10/31/2016  . Special screening for malignant neoplasms, colon 08/12/2016  . Heme positive stool 08/09/2016  . Intertrigo 08/09/2016  . Chronic anticoagulation 04/26/2016  . Chronic venous insufficiency   . Preventative health care 10/05/2014  . Breast mass, left 10/05/2014  . Osteopenia 09/15/2014  . Hypertension 01/04/2014  . Chronic systolic dysfunction of left ventricle 05/05/2013  . At high risk for falls 02/11/2013  . Vitamin D deficiency 07/15/2012  . Sinoatrial node dysfunction (HCC) 05/08/2011  . Allergic rhinitis 05/31/2008  . Morbid obesity (HCC) 02/12/2007  . GASTROESOPHAGEAL REFLUX, NO ESOPHAGITIS 02/12/2007  . Female stress incontinence 02/12/2007  . Seizure disorder (HCC) 02/12/2007  . OSA (obstructive sleep apnea) 02/12/2007    Current Outpatient Prescriptions:  .  amiodarone (PACERONE) 200 MG tablet, Take 1 tablet (200 mg total) by mouth daily., Disp: 30 tablet, Rfl: 3 .  cholecalciferol (VITAMIN D) 1000 units tablet, Take 1 tablet (1,000 Units total) by mouth daily., Disp: 90 tablet, Rfl: 3 .  edoxaban (SAVAYSA) 60 MG TABS tablet, Take 60 mg by mouth daily., Disp: 30 tablet, Rfl: 11 .  fluticasone (FLONASE) 50 MCG/ACT nasal spray, Place 2 sprays into both nostrils daily as needed. For congestion., Disp: 16 g, Rfl: 1 .  omeprazole (PRILOSEC) 20 MG capsule, TAKE ONE CAPSULE BY MOUTH EVERY DAY, Disp: 90 capsule, Rfl: 1 .  PHENobarbital (LUMINAL) 32.4 MG tablet, Take 2 tablets  (64.8 mg total) by mouth 2 (two) times daily., Disp: 120 tablet, Rfl: 5 .  potassium chloride (K-DUR,KLOR-CON) 10 MEQ tablet, Take 4 tablets (40 mEq total) by mouth 2 (two) times daily., Disp: 240 tablet, Rfl: 3 .  torsemide (DEMADEX) 20 MG tablet, Take 1-2 tablets (20-40 mg total) by mouth as directed. Take 40 mg in the AM and 20 mg in the PM, Disp: 90 tablet, Rfl: 6 .  metolazone (ZAROXOLYN) 2.5 MG tablet, Take 2.5 mg by mouth as needed (weight gain or shortness of breath). Take as directed, Disp: , Rfl:  .  OXYGEN, Inhale 2 L into the lungs at bedtime. cpap machine, Disp: , Rfl:  Allergies  Allergen Reactions  . Aspirin Hives and Rash  . Penicillins Rash and Other (See Comments)    Amoxicillin also, Has patient had a PCN reaction causing immediate rash, facial/tongue/throat swelling, SOB or lightheadedness with hypotension: Yes Has patient had a PCN reaction causing severe rash involving mucus membranes or skin necrosis: No Has patient had a PCN reaction that required hospitalization No Has patient had a PCN reaction occurring within the last 10 years: Yes If all of the above answers are "NO", then may proceed with Cephalosporin use.   . Tape Other (See Comments)    NO PAPER TAPE-MUST BE MEDICAL TAPE - unknown reaction  . Wool Alcohol [Lanolin] Hives  . Amoxicillin Rash  . Ampicillin Itching and Rash     Social History   Social History  . Marital status: Married    Spouse name: [name]  . Number  of children: 4  . Years of education: 12   Occupational History  . disabled    Social History Main Topics  . Smoking status: Former Smoker    Packs/day: 1.00    Years: 7.00    Types: Cigarettes    Quit date: 03/16/1969  . Smokeless tobacco: Never Used  . Alcohol use No  . Drug use: No  . Sexual activity: Not Currently    Birth control/ protection: Post-menopausal   Other Topics Concern  . Not on file   Social History Narrative   Lives with husband [name] lives with them as do his 2 children   Daughter, [name], and her 3 children live in [place] -  [name] is incarcerated   Nephew, Tranesha Lessner lives with them   Daughter, [name], in [name] [address]   Moved June 2013 to a better home on [facility].      Lives with husband, son, grandson, granddaughter, nephew.    Physical Exam      Future Appointments Date Time Provider Department Center  08/01/2017 9:00 AM MC ECHO 1-BUZZ [facility]  08/01/2017 10:00 AM [name], [name], MD [facility] None   BP 104/68   Pulse 64   Resp 14   Wt 161 lb (73 kg)   SpO2 98%   BMI 31.44 kg/m  Weight yesterday-154 Last visit weight-156  Pt reports that she is doing well. She splurged for the 4th and ate hotdogs and burgers and drank sodas.  meds verified and pill box refilled. Her weight is up 7lbs from yesterday due to that-had to leave vm on triage line regarding her weight gain, she states she is limiting her fluids today and will monitor her sodium intake rest of day. abd is distended and legs have swelling too.      ACTION: Home visit completed    Kerry Hough, EMT-Paramedic 06/19/17

## 2017-06-26 ENCOUNTER — Other Ambulatory Visit (HOSPITAL_COMMUNITY): Payer: Self-pay

## 2017-06-26 NOTE — Progress Notes (Signed)
Paramedicine Encounter    Patient ID: Misty Gray, female    DOB: 06/15/1951, 66 y.o.   MRN: [id number]   Patient Care Team: Latrelle Dodrill, MD as PCP - General (Family Medicine) [name], MD (Cardiology)  Patient Active Problem List   Diagnosis Date Noted  . Chronic venous stasis dermatitis 12/26/2016  . Urinary tract infection without hematuria   . Frequent PVCs   . Hypokalemia   . Housing problems 11/23/2016  . Chronic atrial fibrillation (HCC) 11/01/2016  . Hypotension 10/31/2016  . Special screening for malignant neoplasms, colon 08/12/2016  . Heme positive stool 08/09/2016  . Intertrigo 08/09/2016  . Chronic anticoagulation 04/26/2016  . Chronic venous insufficiency   . Preventative health care 10/05/2014  . Breast mass, left 10/05/2014  . Osteopenia 09/15/2014  . Hypertension 01/04/2014  . Chronic systolic dysfunction of left ventricle 05/05/2013  . At high risk for falls 02/11/2013  . Vitamin D deficiency 07/15/2012  . Sinoatrial node dysfunction (HCC) 05/08/2011  . Allergic rhinitis 05/31/2008  . Morbid obesity (HCC) 02/12/2007  . GASTROESOPHAGEAL REFLUX, NO ESOPHAGITIS 02/12/2007  . Female stress incontinence 02/12/2007  . Seizure disorder (HCC) 02/12/2007  . OSA (obstructive sleep apnea) 02/12/2007    Current Outpatient Prescriptions:  .  amiodarone (PACERONE) 200 MG tablet, Take 1 tablet (200 mg total) by mouth daily., Disp: 30 tablet, Rfl: 3 .  cholecalciferol (VITAMIN D) 1000 units tablet, Take 1 tablet (1,000 Units total) by mouth daily., Disp: 90 tablet, Rfl: 3 .  edoxaban (SAVAYSA) 60 MG TABS tablet, Take 60 mg by mouth daily., Disp: 30 tablet, Rfl: 11 .  fluticasone (FLONASE) 50 MCG/ACT nasal spray, Place 2 sprays into both nostrils daily as needed. For congestion., Disp: 16 g, Rfl: 1 .  metolazone (ZAROXOLYN) 2.5 MG tablet, Take 2.5 mg by mouth as needed (weight gain or shortness of breath). Take as directed, Disp: , Rfl:  .  omeprazole  (PRILOSEC) 20 MG capsule, TAKE ONE CAPSULE BY MOUTH EVERY DAY, Disp: 90 capsule, Rfl: 1 .  OXYGEN, Inhale 2 L into the lungs at bedtime. cpap machine, Disp: , Rfl:  .  PHENobarbital (LUMINAL) 32.4 MG tablet, Take 2 tablets (64.8 mg total) by mouth 2 (two) times daily., Disp: 120 tablet, Rfl: 5 .  potassium chloride (K-DUR,KLOR-CON) 10 MEQ tablet, Take 4 tablets (40 mEq total) by mouth 2 (two) times daily., Disp: 240 tablet, Rfl: 3 .  torsemide (DEMADEX) 20 MG tablet, Take 1-2 tablets (20-40 mg total) by mouth as directed. Take 40 mg in the AM and 20 mg in the PM, Disp: 90 tablet, Rfl: 6 Allergies  Allergen Reactions  . Aspirin Hives and Rash  . Penicillins Rash and Other (See Comments)    Amoxicillin also, Has patient had a PCN reaction causing immediate rash, facial/tongue/throat swelling, SOB or lightheadedness with hypotension: Yes Has patient had a PCN reaction causing severe rash involving mucus membranes or skin necrosis: No Has patient had a PCN reaction that required hospitalization No Has patient had a PCN reaction occurring within the last 10 years: Yes If all of the above answers are "NO", then may proceed with Cephalosporin use.   . Tape Other (See Comments)    NO PAPER TAPE-MUST BE MEDICAL TAPE - unknown reaction  . Wool Alcohol [Lanolin] Hives  . Amoxicillin Rash  . Ampicillin Itching and Rash     Social History   Social History  . Marital status: Married    Spouse name: [name]  . Number  of children: 4  . Years of education: 12   Occupational History  . disabled    Social History Main Topics  . Smoking status: Former Smoker    Packs/day: 1.00    Years: 7.00    Types: Cigarettes    Quit date: 03/16/1969  . Smokeless tobacco: Never Used  . Alcohol use No  . Drug use: No  . Sexual activity: Not Currently    Birth control/ protection: Post-menopausal   Other Topics Concern  . Not on file   Social History Narrative   Lives with husband [name] lives with them as do his 2 children   Daughter, [name], and her 3 children live in [place] -  [name] is incarcerated   Nephew, [name] lives with them   Daughter, [name], in [name] [address]   Moved June 2013 to a better home on [facility].      Lives with husband, son, grandson, granddaughter, nephew.    Physical Exam      Future Appointments Date Time Provider Department Center  08/01/2017 9:00 AM MC ECHO 1-BUZZ [facility]  08/01/2017 10:00 AM [name], [name], MD [facility] None   There were no vitals taken for this visit. Weight yesterday-152.4 Last visit weight-161   Pt reports her weights have been up and down, she did take the metolazone last week as directed. Pt reports she is feeling good, pt denies any increased sob, no dizziness. Weighing daily.  She felt like her weight was increasing as her abd was feeling tighter and she took an extra torsemide on Sunday.  She is missing phenobarbital in next Thursday dose of pills. meds verified and pill box refilled.    ACTION: Home visit completed  Kerry Hough, EMT-Paramedic 06/26/17

## 2017-07-03 ENCOUNTER — Other Ambulatory Visit (HOSPITAL_COMMUNITY): Payer: Self-pay
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
## 2017-07-08 ENCOUNTER — Other Ambulatory Visit (HOSPITAL_COMMUNITY): Payer: Self-pay | Admitting: *Deleted

## 2017-07-10 ENCOUNTER — Other Ambulatory Visit (HOSPITAL_COMMUNITY): Payer: Self-pay

## 2017-07-10 NOTE — Progress Notes (Signed)
Paramedicine Encounter    Patient ID: Misty Gray, female    DOB: 22-Jul-1951, 66 y.o.   MRN: [id number]   Patient Care Team: Latrelle Dodrill, MD as PCP - General (Family Medicine) [name], MD (Cardiology)  Patient Active Problem List   Diagnosis Date Noted  . Chronic venous stasis dermatitis 12/26/2016  . Urinary tract infection without hematuria   . Frequent PVCs   . Hypokalemia   . Housing problems 11/23/2016  . Chronic atrial fibrillation (HCC) 11/01/2016  . Hypotension 10/31/2016  . Special screening for malignant neoplasms, colon 08/12/2016  . Heme positive stool 08/09/2016  . Intertrigo 08/09/2016  . Chronic anticoagulation 04/26/2016  . Chronic venous insufficiency   . Preventative health care 10/05/2014  . Breast mass, left 10/05/2014  . Osteopenia 09/15/2014  . Hypertension 01/04/2014  . Chronic systolic dysfunction of left ventricle 05/05/2013  . At high risk for falls 02/11/2013  . Vitamin D deficiency 07/15/2012  . Sinoatrial node dysfunction (HCC) 05/08/2011  . Allergic rhinitis 05/31/2008  . Morbid obesity (HCC) 02/12/2007  . GASTROESOPHAGEAL REFLUX, NO ESOPHAGITIS 02/12/2007  . Female stress incontinence 02/12/2007  . Seizure disorder (HCC) 02/12/2007  . OSA (obstructive sleep apnea) 02/12/2007    Current Outpatient Prescriptions:  .  amiodarone (PACERONE) 200 MG tablet, Take 1 tablet (200 mg total) by mouth daily., Disp: 30 tablet, Rfl: 3 .  cholecalciferol (VITAMIN D) 1000 units tablet, Take 1 tablet (1,000 Units total) by mouth daily., Disp: 90 tablet, Rfl: 3 .  edoxaban (SAVAYSA) 60 MG TABS tablet, Take 60 mg by mouth daily., Disp: 30 tablet, Rfl: 11 .  omeprazole (PRILOSEC) 20 MG capsule, TAKE ONE CAPSULE BY MOUTH EVERY DAY, Disp: 90 capsule, Rfl: 1 .  PHENobarbital (LUMINAL) 32.4 MG tablet, Take 2 tablets (64.8 mg total) by mouth 2 (two) times daily., Disp: 120 tablet, Rfl: 5 .  potassium chloride (K-DUR,KLOR-CON) 10 MEQ tablet, Take 4  tablets (40 mEq total) by mouth 2 (two) times daily., Disp: 240 tablet, Rfl: 3 .  torsemide (DEMADEX) 20 MG tablet, Take 1-2 tablets (20-40 mg total) by mouth as directed. Take 40 mg in the AM and 20 mg in the PM, Disp: 90 tablet, Rfl: 6 .  fluticasone (FLONASE) 50 MCG/ACT nasal spray, Place 2 sprays into both nostrils daily as needed. For congestion. (Patient not taking: Reported on 06/26/2017), Disp: 16 g, Rfl: 1 .  metolazone (ZAROXOLYN) 2.5 MG tablet, Take 2.5 mg by mouth as needed (weight gain or shortness of breath). Take as directed, Disp: , Rfl:  .  OXYGEN, Inhale 2 L into the lungs at bedtime. cpap machine, Disp: , Rfl:  Allergies  Allergen Reactions  . Aspirin Hives and Rash  . Penicillins Rash and Other (See Comments)    Amoxicillin also, Has patient had a PCN reaction causing immediate rash, facial/tongue/throat swelling, SOB or lightheadedness with hypotension: Yes Has patient had a PCN reaction causing severe rash involving mucus membranes or skin necrosis: No Has patient had a PCN reaction that required hospitalization No Has patient had a PCN reaction occurring within the last 10 years: Yes If all of the above answers are "NO", then may proceed with Cephalosporin use.   . Tape Other (See Comments)    NO PAPER TAPE-MUST BE MEDICAL TAPE - unknown reaction  . Wool Alcohol [Lanolin] Hives  . Amoxicillin Rash  . Ampicillin Itching and Rash     Social History   Social History  . Marital status: Married  Spouse name: [name]  . Number of children: 4  . Years of education: 12   Occupational History  . disabled    Social History Main Topics  . Smoking status: Former Smoker    Packs/day: 1.00    Years: 7.00    Types: Cigarettes    Quit date: 03/16/1969  . Smokeless tobacco: Never Used  . Alcohol use No  . Drug use: No  . Sexual activity: Not Currently    Birth control/ protection: Post-menopausal   Other Topics Concern  . Not on file   Social History Narrative    Lives with husband [name] lives with them as do his 2 children   Daughter, [name], and her 3 children live in [place] -  [name] is incarcerated   Nephew, Tkeya Stencil lives with them   Daughter, [name], in [name] [address]   Moved June 2013 to a better home on [facility].      Lives with husband, son, grandson, granddaughter, nephew.    Physical Exam      Future Appointments Date Time Provider Department Center  08/01/2017 9:00 AM MC ECHO 1-BUZZ [facility]  08/01/2017 10:00 AM [name], [name], MD [facility] None  08/15/2017 11:30 AM Latrelle Dodrill, MD [facility]   BP 118/70   Pulse 60   Resp 15   Wt 156 lb (70.8 kg)   SpO2 98%   BMI 30.47 kg/m  Weight yesterday-156 Last visit weight-154  She states she is doing well. She missed her noon dose of torsemide on Sunday, her weight is up 2lbs from last week. She reports she is tired from working [employer] at [employer]. She also have been eating at [employer] which includes pizza, tacos, chips etc. Advised her to be cautious of that. Legs feel slightly swollen.   Will need to come back out on Tuesday to refill med box-called pharmacy to get them refilled and sent out.  Pt states she has no increased sob, no dizziness, no h/a. She reports she has occasional nose bleed but is easily stopped. She feels like there may be a sore in her nose that gets irritated and messed with when she wipes her nose from her allergy/sinus drainage.   ACTION: Home visit completed  Kerry Hough, EMT-Paramedic 07/10/17

## 2017-07-15 ENCOUNTER — Other Ambulatory Visit (HOSPITAL_COMMUNITY): Payer: Self-pay

## 2017-07-15 NOTE — Progress Notes (Signed)
Paramedicine Encounter    Patient ID: Misty Gray, female    DOB: 1951-07-23, 66 y.o.   MRN: [id number]   Patient Care Team: Latrelle Dodrill, MD as PCP - General (Family Medicine) [name], MD (Cardiology)  Patient Active Problem List   Diagnosis Date Noted  . Chronic venous stasis dermatitis 12/26/2016  . Urinary tract infection without hematuria   . Frequent PVCs   . Hypokalemia   . Housing problems 11/23/2016  . Chronic atrial fibrillation (HCC) 11/01/2016  . Hypotension 10/31/2016  . Special screening for malignant neoplasms, colon 08/12/2016  . Heme positive stool 08/09/2016  . Intertrigo 08/09/2016  . Chronic anticoagulation 04/26/2016  . Chronic venous insufficiency   . Preventative health care 10/05/2014  . Breast mass, left 10/05/2014  . Osteopenia 09/15/2014  . Hypertension 01/04/2014  . Chronic systolic dysfunction of left ventricle 05/05/2013  . At high risk for falls 02/11/2013  . Vitamin D deficiency 07/15/2012  . Sinoatrial node dysfunction (HCC) 05/08/2011  . Allergic rhinitis 05/31/2008  . Morbid obesity (HCC) 02/12/2007  . GASTROESOPHAGEAL REFLUX, NO ESOPHAGITIS 02/12/2007  . Female stress incontinence 02/12/2007  . Seizure disorder (HCC) 02/12/2007  . OSA (obstructive sleep apnea) 02/12/2007    Current Outpatient Prescriptions:  .  amiodarone (PACERONE) 200 MG tablet, Take 1 tablet (200 mg total) by mouth daily., Disp: 30 tablet, Rfl: 3 .  cholecalciferol (VITAMIN D) 1000 units tablet, Take 1 tablet (1,000 Units total) by mouth daily., Disp: 90 tablet, Rfl: 3 .  edoxaban (SAVAYSA) 60 MG TABS tablet, Take 60 mg by mouth daily., Disp: 30 tablet, Rfl: 11 .  fluticasone (FLONASE) 50 MCG/ACT nasal spray, Place 2 sprays into both nostrils daily as needed. For congestion. (Patient not taking: Reported on 06/26/2017), Disp: 16 g, Rfl: 1 .  metolazone (ZAROXOLYN) 2.5 MG tablet, Take 2.5 mg by mouth as needed (weight gain or shortness of breath). Take as  directed, Disp: , Rfl:  .  omeprazole (PRILOSEC) 20 MG capsule, TAKE ONE CAPSULE BY MOUTH EVERY DAY, Disp: 90 capsule, Rfl: 1 .  OXYGEN, Inhale 2 L into the lungs at bedtime. cpap machine, Disp: , Rfl:  .  PHENobarbital (LUMINAL) 32.4 MG tablet, Take 2 tablets (64.8 mg total) by mouth 2 (two) times daily., Disp: 120 tablet, Rfl: 5 .  potassium chloride (K-DUR,KLOR-CON) 10 MEQ tablet, Take 4 tablets (40 mEq total) by mouth 2 (two) times daily., Disp: 240 tablet, Rfl: 3 .  torsemide (DEMADEX) 20 MG tablet, Take 1-2 tablets (20-40 mg total) by mouth as directed. Take 40 mg in the AM and 20 mg in the PM, Disp: 90 tablet, Rfl: 6 Allergies  Allergen Reactions  . Aspirin Hives and Rash  . Penicillins Rash and Other (See Comments)    Amoxicillin also, Has patient had a PCN reaction causing immediate rash, facial/tongue/throat swelling, SOB or lightheadedness with hypotension: Yes Has patient had a PCN reaction causing severe rash involving mucus membranes or skin necrosis: No Has patient had a PCN reaction that required hospitalization No Has patient had a PCN reaction occurring within the last 10 years: Yes If all of the above answers are "NO", then may proceed with Cephalosporin use.   . Tape Other (See Comments)    NO PAPER TAPE-MUST BE MEDICAL TAPE - unknown reaction  . Wool Alcohol [Lanolin] Hives  . Amoxicillin Rash  . Ampicillin Itching and Rash     Social History   Social History  . Marital status: Married  Spouse name: [name]  . Number of children: 4  . Years of education: 12   Occupational History  . disabled    Social History Main Topics  . Smoking status: Former Smoker    Packs/day: 1.00    Years: 7.00    Types: Cigarettes    Quit date: 03/16/1969  . Smokeless tobacco: Never Used  . Alcohol use No  . Drug use: No  . Sexual activity: Not Currently    Birth control/ protection: Post-menopausal   Other Topics Concern  . Not on file   Social History Narrative    Lives with husband [name] lives with them as do his 2 children   Daughter, [name], and her 3 children live in [place] -  [name] is incarcerated   Nephew, Gracee Ratterree lives with them   Daughter, [name], in [name] [address]   Moved June 2013 to a better home on [facility].      Lives with husband, son, grandson, granddaughter, nephew.    Physical Exam      Future Appointments Date Time Provider Department Center  08/01/2017 9:00 AM MC ECHO 1-BUZZ [facility]  08/01/2017 10:00 AM [name], [name], MD [facility] None  08/15/2017 11:30 AM Latrelle Dodrill, MD [facility]   BP 114/78   Pulse 62   Resp 15   Wt 154 lb (69.9 kg)   SpO2 97%   BMI 30.08 kg/m  Weight yesterday-154.4 Last visit weight-156  Pt reports she is doing ok, weight back down to 154 this week, her weight was up last week due to increased sodium intake at [facility] all week last week. She missed a torsemide pill last tues and wed evening due to her being at church and she forgot. [facility] did not deliver her meds out Friday like they were suppose to, I called them and it will be sent out today. So I will have to come back out tomor or this evening to fill her pill box. Pt denies any dizziness, no sob. No swelling noted.   ACTION: Home visit completed  Kerry Hough, EMT-Paramedic 07/15/17

## 2017-07-15 NOTE — Progress Notes (Signed)
F/u med rec pill box refilled and meds verified  Kerry Hough, EMT-Paramedic  07/15/17

## 2017-07-16 DIAGNOSIS — Q871 Congenital malformation syndromes predominantly associated with short stature: Secondary | ICD-10-CM | POA: Diagnosis not present

## 2017-07-16 DIAGNOSIS — I279 Pulmonary heart disease, unspecified: Secondary | ICD-10-CM | POA: Diagnosis not present

## 2017-07-16 DIAGNOSIS — R0902 Hypoxemia: Secondary | ICD-10-CM | POA: Diagnosis not present

## 2017-07-16 DIAGNOSIS — G4733 Obstructive sleep apnea (adult) (pediatric): Secondary | ICD-10-CM | POA: Diagnosis not present

## 2017-07-21 ENCOUNTER — Telehealth: Payer: Self-pay | Admitting: Family Medicine

## 2017-07-21 NOTE — Telephone Encounter (Signed)
Pt states she had a hernia removed in 1989 and the incision is bothering her. Pt has lost a lot of weight recently and the incision is pulling and pt states it looks like snake skin (peeling). Pt denies appt at this time, would like PCP to call her. ep

## 2017-07-21 NOTE — Telephone Encounter (Signed)
Advised patient that she need to be seen by a provider. Appt scheduled for tomorrow at 2:10 PM with Dr. Sampson Goon.  Clovis Pu, RN

## 2017-07-22 ENCOUNTER — Encounter: Payer: Self-pay | Admitting: Internal Medicine

## 2017-07-22 ENCOUNTER — Ambulatory Visit (INDEPENDENT_AMBULATORY_CARE_PROVIDER_SITE_OTHER): Payer: Medicare HMO | Admitting: Internal Medicine

## 2017-07-22 VITALS — BP 118/70 | HR 72 | Temp 98.5°F | Ht 60.0 in | Wt 156.0 lb

## 2017-07-22 DIAGNOSIS — L84 Corns and callosities: Secondary | ICD-10-CM

## 2017-07-22 DIAGNOSIS — R238 Other skin changes: Secondary | ICD-10-CM

## 2017-07-22 NOTE — Patient Instructions (Signed)
Misty Gray,  Please keep an eye on the area on your scar. It does not look infected, and I do not see signs of the hernia reoccurring.  Return if you have drainage, increased redness, or fever.  I will refer you to podiatry for your R foot callus.   Best, Dr. Sampson Goon

## 2017-07-23 DIAGNOSIS — R238 Other skin changes: Secondary | ICD-10-CM

## 2017-07-23 HISTORY — DX: Other skin changes: R23.8

## 2017-07-23 NOTE — Progress Notes (Signed)
[facility] Progress Note  Subjective:  Misty Gray is a 66 y.o. female with history of chronic venous insufficiency, afib, seizure disorder, CHF, and repaired ventral hernia who presents for concern about her hernia scar. She reports noting a red area a couple weeks ago, though may have been going on longer. Thinks it could be due to the area starting to "pull" since she has lost weight. Says she had hernia surgery in 1989. She wants to be sure the hernia is not coming back or that something won't come out. ROS: No drainage from skin, no fevers, no loss of appetite.  Allergies  Allergen Reactions  . Aspirin Hives and Rash  . Penicillins Rash and Other (See Comments)    Amoxicillin also, Has patient had a PCN reaction causing immediate rash, facial/tongue/throat swelling, SOB or lightheadedness with hypotension: Yes Has patient had a PCN reaction causing severe rash involving mucus membranes or skin necrosis: No Has patient had a PCN reaction that required hospitalization No Has patient had a PCN reaction occurring within the last 10 years: Yes If all of the above answers are "NO", then may proceed with Cephalosporin use.   . Tape Other (See Comments)    NO PAPER TAPE-MUST BE MEDICAL TAPE - unknown reaction  . Wool Alcohol [Lanolin] Hives  . Amoxicillin Rash  . Ampicillin Itching and Rash    Objective: Blood pressure 118/70, pulse 72, temperature 98.5 F (36.9 C), temperature source Oral, height 5' (1.524 m), weight 156 lb (70.8 kg), SpO2 94 %. Body mass index is 30.47 kg/m. Constitutional: Obese female, in NAD Abdominal: Soft. +BS, NT, ND. Large, soft, somewhat reducible hernia of LUQ. Previous large vertical surgery scar intact and firm without recurrence of hernia noted.  Musculoskeletal: LE edema and chronic venous stasis changes.  Skin: Callus of R plantar surface of foot that is TTP. ~1x1 cm circular area of scar tissue with hyperpigmentation but no surrounding  warmth or erythema. No drainage from skin lesion but appears to have central plug.  Psychiatric: Normal mood and affect.  Vitals reviewed     Assessment/Plan: Skin irritation - Area of hyperpigmented scar tissue at site of old ventral hernia scar. Differential includes cellulitis, contact dermatitis, and new scarring due to changing tension on skin. No fever or surrounding erythema/warmth to suggest cellulitis. Pants line does fall over this area, so could be contact dermatitis from physical irritation.  - Advised patient to continue to monitor area for pain, redness or drainage. - She is not interested in surgical referral for LUQ hernia, as she does not think she'd do well with a repeat surgery and is not having any symptoms at this time.   Will place referral to podiatry for R foot callus--patient says she never heard back about previously placed referral and area continues to be painful.   Follow-up prn.  Dani Gobble, MD [facility], PGY-3

## 2017-07-23 NOTE — Assessment & Plan Note (Signed)
-   Area of hyperpigmented scar tissue at site of old ventral hernia scar. Differential includes cellulitis, contact dermatitis, and new scarring due to changing tension on skin. No fever or surrounding erythema/warmth to suggest cellulitis. Pants line does fall over this area, so could be contact dermatitis from physical irritation.  - Advised patient to continue to monitor area for pain, redness or drainage. - She is not interested in surgical referral for LUQ hernia, as she does not think she'd do well with a repeat surgery and is not having any symptoms at this time.

## 2017-07-24 ENCOUNTER — Other Ambulatory Visit: Payer: Self-pay | Admitting: Internal Medicine

## 2017-07-24 ENCOUNTER — Other Ambulatory Visit (HOSPITAL_COMMUNITY): Payer: Self-pay

## 2017-07-24 NOTE — Addendum Note (Signed)
Addended by: Jamelle Haring on: 07/24/2017 09:45 AM   Modules accepted: Orders

## 2017-07-24 NOTE — Progress Notes (Signed)
Paramedicine Encounter    Patient ID: Misty Gray, female    DOB: [birth year], 66 y.o.   MRN: [id number]   Patient Care Team: Misty Dodrill, MD as PCP - General (Family Medicine) Misty Range, MD (Cardiology)  Patient Active Problem List   Diagnosis Date Noted  . Skin irritation 07/23/2017  . Chronic venous stasis dermatitis 12/26/2016  . Urinary tract infection without hematuria   . Frequent PVCs   . Hypokalemia   . Housing problems 11/23/2016  . Chronic atrial fibrillation (HCC) 11/01/2016  . Hypotension 10/31/2016  . Special screening for malignant neoplasms, colon 08/12/2016  . Heme positive stool 08/09/2016  . Intertrigo 08/09/2016  . Chronic anticoagulation 04/26/2016  . Chronic venous insufficiency   . Preventative health care 10/05/2014  . Breast mass, left 10/05/2014  . Osteopenia 09/15/2014  . Hypertension 01/04/2014  . Chronic systolic dysfunction of left ventricle 05/05/2013  . At high risk for falls 02/11/2013  . Vitamin D deficiency 07/15/2012  . Sinoatrial node dysfunction (HCC) 05/08/2011  . Allergic rhinitis 05/31/2008  . Morbid obesity (HCC) 02/12/2007  . GASTROESOPHAGEAL REFLUX, NO ESOPHAGITIS 02/12/2007  . Female stress incontinence 02/12/2007  . Seizure disorder (HCC) 02/12/2007  . OSA (obstructive sleep apnea) 02/12/2007    Current Outpatient Prescriptions:  .  amiodarone (PACERONE) 200 MG tablet, Take 1 tablet (200 mg total) by mouth daily., Disp: 30 tablet, Rfl: 3 .  cholecalciferol (VITAMIN D) 1000 units tablet, Take 1 tablet (1,000 Units total) by mouth daily., Disp: 90 tablet, Rfl: 3 .  edoxaban (SAVAYSA) 60 MG TABS tablet, Take 60 mg by mouth daily., Disp: 30 tablet, Rfl: 11 .  fluticasone (FLONASE) 50 MCG/ACT nasal spray, Place 2 sprays into both nostrils daily as needed. For congestion., Disp: 16 g, Rfl: 1 .  metolazone (ZAROXOLYN) 2.5 MG tablet, Take 2.5 mg by mouth as needed (weight gain or shortness of breath). Take as directed,  Disp: , Rfl:  .  omeprazole (PRILOSEC) 20 MG capsule, TAKE ONE CAPSULE BY MOUTH EVERY DAY, Disp: 90 capsule, Rfl: 1 .  PHENobarbital (LUMINAL) 32.4 MG tablet, Take 2 tablets (64.8 mg total) by mouth 2 (two) times daily., Disp: 120 tablet, Rfl: 5 .  potassium chloride (K-DUR,KLOR-CON) 10 MEQ tablet, Take 4 tablets (40 mEq total) by mouth 2 (two) times daily., Disp: 240 tablet, Rfl: 3 .  torsemide (DEMADEX) 20 MG tablet, Take 1-2 tablets (20-40 mg total) by mouth as directed. Take 40 mg in the AM and 20 mg in the PM, Disp: 90 tablet, Rfl: 6 .  OXYGEN, Inhale 2 L into the lungs at bedtime. cpap machine, Disp: , Rfl:  Allergies  Allergen Reactions  . Aspirin Hives and Rash  . Penicillins Rash and Other (See Comments)    Amoxicillin also, Has patient had a PCN reaction causing immediate rash, facial/tongue/throat swelling, SOB or lightheadedness with hypotension: Yes Has patient had a PCN reaction causing severe rash involving mucus membranes or skin necrosis: No Has patient had a PCN reaction that required hospitalization No Has patient had a PCN reaction occurring within the last 10 years: Yes If all of the above answers are "NO", then may proceed with Cephalosporin use.   . Tape Other (See Comments)    NO PAPER TAPE-MUST BE MEDICAL TAPE - unknown reaction  . Wool Alcohol [Lanolin] Hives  . Amoxicillin Rash  . Ampicillin Itching and Rash     Social History   Social History  . Marital status: Married    Spouse  name: Misty Gray  . Number of children: 4  . Years of education: 12   Occupational History  . disabled    Social History Main Topics  . Smoking status: Former Smoker    Packs/day: 1.00    Years: 7.00    Types: Cigarettes    Quit date: 03/16/1969  . Smokeless tobacco: Never Used  . Alcohol use No  . Drug use: No  . Sexual activity: Not Currently    Birth control/ protection: Post-menopausal   Other Topics Concern  . Not on file   Social History Narrative   Lives with  husband Misty Gray lives with them as do his 2 children   Daughter, Misty Gray, and her 3 children live in [place] -  Misty Gray   Nephew, Misty Gray lives with them   Daughter, Misty Gray, in [name] [address]   Moved June 2013 to a better home on [facility].      Lives with husband, son, grandson, granddaughter, nephew.    Physical Exam  Constitutional: She is oriented to person, place, and time. She appears well-developed.  Cardiovascular: Normal rate and regular rhythm.   Pulmonary/Chest: Effort normal and breath sounds normal. No respiratory distress. She has no wheezes. She has no rales.  Abdominal: Soft.  Musculoskeletal: Normal Gray of motion. She exhibits no edema.  Neurological: She is alert and oriented to person, place, and time.  Skin: Skin is warm and dry.  Psychiatric: She has a normal mood and affect.        Future Appointments Date Time Provider Department Center  08/01/2017 9:00 AM MC ECHO 1-BUZZ [facility]  08/01/2017 10:00 AM [name], [name], MD [facility] None  08/15/2017 11:30 AM Misty Dodrill, MD [facility]   BP 96/60 (BP Location: Left Arm, Patient Position: Sitting, Cuff Size: Normal)   Pulse 60   Resp 16   Wt 158 lb 6.4 oz (71.8 kg)   SpO2 98%   BMI 30.94 kg/m  Weight yesterday- 158.0 lbs Last visit weight- 154.0 lbs  Ms Misty Gray was seen at home today and reports feeling well. She denied SOB, dizziness, headache and is still using two pillows to sleep at night. She had filled her pillbox prior to my arrival with minimal errors (i.e. missing amio and potassium in the wrong bin). Mistakes were resolved and medications were verified. Phenobarbital and omeprazole were called in at [facility] and will be delivered. Metolazone Rx was also transferred but does not need to be filled at this time.  She is still unable to use home oxygen because the breaker trips every time  she plugs the machine into the wall outlet. She reports that she has told her landlord about this and has been told someone would come fix the issue but no one has come.   Time spent with patient: 44 minutes  Jacqualine Code, EMT 07/24/17  ACTION: Home visit completed Next visit planned for 1 week

## 2017-07-28 ENCOUNTER — Other Ambulatory Visit: Payer: Self-pay | Admitting: Family Medicine

## 2017-07-28 ENCOUNTER — Other Ambulatory Visit (HOSPITAL_COMMUNITY): Payer: Self-pay | Admitting: Cardiology

## 2017-07-28 MED ORDER — OMEPRAZOLE 20 MG PO CPDR: 20.0000 mg | DELAYED_RELEASE_CAPSULE | Freq: Every day | ORAL | 1 refills | Status: DC

## 2017-07-28 MED ORDER — FLUTICASONE PROPIONATE 50 MCG/ACT NA SUSP: 2.0000 | Freq: Every day | NASAL | 1 refills | Status: DC | PRN

## 2017-07-28 MED ORDER — METOLAZONE 2.5 MG PO TABS: 2.5000 mg | ORAL_TABLET | ORAL | 1 refills | Status: DC | PRN

## 2017-07-28 NOTE — Telephone Encounter (Signed)
Pt calling to request refill of:  Name of Medication(s):  Flonase and omeprazole  Last date of OV:  07-22-17 Pharmacy:  [facility]   Will route refill request to Clinic RN.  Discussed with patient policy to call pharmacy for future refills.  Also, discussed refills may take up to 48 hours to approve or deny.  Markus Jarvis

## 2017-07-31 ENCOUNTER — Other Ambulatory Visit (HOSPITAL_COMMUNITY): Payer: Self-pay

## 2017-07-31 NOTE — Progress Notes (Signed)
Came back out today to finish pill box-had to wait for pharmacy to deliver meds. Dictation #1 [id number]  [id number]

## 2017-07-31 NOTE — Progress Notes (Signed)
Paramedicine Encounter    Patient ID: Misty Gray, female    DOB: 1951-05-24, 66 y.o.   MRN: [id number]   Patient Care Team: Latrelle Dodrill, MD as PCP - General (Family Medicine) [name], MD (Cardiology)  Patient Active Problem List   Diagnosis Date Noted  . Skin irritation 07/23/2017  . Chronic venous stasis dermatitis 12/26/2016  . Urinary tract infection without hematuria   . Frequent PVCs   . Hypokalemia   . Housing problems 11/23/2016  . Chronic atrial fibrillation (HCC) 11/01/2016  . Hypotension 10/31/2016  . Special screening for malignant neoplasms, colon 08/12/2016  . Heme positive stool 08/09/2016  . Intertrigo 08/09/2016  . Chronic anticoagulation 04/26/2016  . Chronic venous insufficiency   . Preventative health care 10/05/2014  . Breast mass, left 10/05/2014  . Osteopenia 09/15/2014  . Hypertension 01/04/2014  . Chronic systolic dysfunction of left ventricle 05/05/2013  . At high risk for falls 02/11/2013  . Vitamin D deficiency 07/15/2012  . Sinoatrial node dysfunction (HCC) 05/08/2011  . Allergic rhinitis 05/31/2008  . Morbid obesity (HCC) 02/12/2007  . GASTROESOPHAGEAL REFLUX, NO ESOPHAGITIS 02/12/2007  . Female stress incontinence 02/12/2007  . Seizure disorder (HCC) 02/12/2007  . OSA (obstructive sleep apnea) 02/12/2007    Current Outpatient Prescriptions:  .  amiodarone (PACERONE) 200 MG tablet, Take 1 tablet (200 mg total) by mouth daily., Disp: 30 tablet, Rfl: 3 .  cholecalciferol (VITAMIN D) 1000 units tablet, Take 1 tablet (1,000 Units total) by mouth daily., Disp: 90 tablet, Rfl: 3 .  edoxaban (SAVAYSA) 60 MG TABS tablet, Take 60 mg by mouth daily., Disp: 30 tablet, Rfl: 11 .  fluticasone (FLONASE) 50 MCG/ACT nasal spray, Place 2 sprays into both nostrils daily as needed. For congestion., Disp: 16 g, Rfl: 1 .  omeprazole (PRILOSEC) 20 MG capsule, Take 1 capsule (20 mg total) by mouth daily., Disp: 90 capsule, Rfl: 1 .  PHENobarbital  (LUMINAL) 32.4 MG tablet, Take 2 tablets (64.8 mg total) by mouth 2 (two) times daily., Disp: 120 tablet, Rfl: 5 .  potassium chloride (K-DUR,KLOR-CON) 10 MEQ tablet, Take 4 tablets (40 mEq total) by mouth 2 (two) times daily., Disp: 240 tablet, Rfl: 3 .  torsemide (DEMADEX) 20 MG tablet, Take 1-2 tablets (20-40 mg total) by mouth as directed. Take 40 mg in the AM and 20 mg in the PM, Disp: 90 tablet, Rfl: 6 .  metolazone (ZAROXOLYN) 2.5 MG tablet, Take 1 tablet (2.5 mg total) by mouth as needed (weight gain or shortness of breath). Take as directed (Patient not taking: Reported on 07/31/2017), Disp: 15 tablet, Rfl: 1 .  OXYGEN, Inhale 2 L into the lungs at bedtime. cpap machine, Disp: , Rfl:  Allergies  Allergen Reactions  . Aspirin Hives and Rash  . Penicillins Rash and Other (See Comments)    Amoxicillin also, Has patient had a PCN reaction causing immediate rash, facial/tongue/throat swelling, SOB or lightheadedness with hypotension: Yes Has patient had a PCN reaction causing severe rash involving mucus membranes or skin necrosis: No Has patient had a PCN reaction that required hospitalization No Has patient had a PCN reaction occurring within the last 10 years: Yes If all of the above answers are "NO", then may proceed with Cephalosporin use.   . Tape Other (See Comments)    NO PAPER TAPE-MUST BE MEDICAL TAPE - unknown reaction  . Wool Alcohol [Lanolin] Hives  . Amoxicillin Rash  . Ampicillin Itching and Rash     Social History  Social History  . Marital status: Married    Spouse name: [name]  . Number of children: 4  . Years of education: 12   Occupational History  . disabled    Social History Main Topics  . Smoking status: Former Smoker    Packs/day: 1.00    Years: 7.00    Types: Cigarettes    Quit date: 03/16/1969  . Smokeless tobacco: Never Used  . Alcohol use No  . Drug use: No  . Sexual activity: Not Currently    Birth control/ protection: Post-menopausal    Other Topics Concern  . Not on file   Social History Narrative   Lives with husband [name] lives with them as do his 2 children   Daughter, [name], and her 3 children live in [place] -  [name] is incarcerated   Nephew, Alejandro Mccallum lives with them   Daughter, [name], in [name] [address]   Moved June 2013 to a better home on [facility].      Lives with husband, son, grandson, granddaughter, nephew.    Physical Exam      Future Appointments Date Time Provider Department Center  08/01/2017 9:00 AM MC ECHO 1-BUZZ [facility]  08/01/2017 10:00 AM [name], [name], MD [facility] None  08/11/2017 11:30 AM [name], DPM [facility]  08/15/2017 11:30 AM Latrelle Dodrill, MD [facility]   BP 106/70   Pulse 60   Resp 15   Wt 154 lb (69.9 kg)   SpO2 98%   BMI 30.08 kg/m  Weight yesterday-156 Last visit weight-158  Pt reports she is feeling good, no sob, no dizziness, pt states she is having foot pain and is going to see PCP soon about it.  She needs her phenobarbitol to be delivered today-she has enough in there for tomor evening and that's it. I called pharmacy and they reported it will be out today-I will have to come back out today and finish her pill box- He also has to check on the omeporazole as that needs a new rx-  Next week I am going to look into changing her over to [facility] pharmacy pill packing and delivery-they are giving away $10 gift card to [employer] for up to 5 rx moved over and that would help her out tremendously.  meds verified and pill box refilled. She is going to clinic tomor.    ACTION: Home visit completed  Kerry Hough, EMT-Paramedic 07/31/17

## 2017-08-01 ENCOUNTER — Ambulatory Visit (HOSPITAL_COMMUNITY)
Admission: RE | Admit: 2017-08-01 | Discharge: 2017-08-01 | Disposition: A | Discharge status: Home / Self Care | Payer: Medicare HMO | Source: Ambulatory Visit | Attending: Internal Medicine | Admitting: Internal Medicine

## 2017-08-01 ENCOUNTER — Encounter (HOSPITAL_COMMUNITY): Payer: Self-pay | Admitting: Internal Medicine

## 2017-08-01 ENCOUNTER — Ambulatory Visit (HOSPITAL_BASED_OUTPATIENT_CLINIC_OR_DEPARTMENT_OTHER)
Admission: RE | Admit: 2017-08-01 | Discharge: 2017-08-01 | Disposition: A | Discharge status: Home / Self Care | Payer: Medicare HMO | Source: Ambulatory Visit | Attending: Internal Medicine | Admitting: Internal Medicine

## 2017-08-01 VITALS — BP 122/66 | HR 67 | Wt 155.0 lb

## 2017-08-01 DIAGNOSIS — I519 Heart disease, unspecified: Secondary | ICD-10-CM

## 2017-08-01 DIAGNOSIS — E7800 Pure hypercholesterolemia, unspecified: Secondary | ICD-10-CM | POA: Diagnosis not present

## 2017-08-01 DIAGNOSIS — I872 Venous insufficiency (chronic) (peripheral): Secondary | ICD-10-CM | POA: Insufficient documentation

## 2017-08-01 DIAGNOSIS — Z886 Allergy status to analgesic agent status: Secondary | ICD-10-CM | POA: Insufficient documentation

## 2017-08-01 DIAGNOSIS — K219 Gastro-esophageal reflux disease without esophagitis: Secondary | ICD-10-CM | POA: Diagnosis not present

## 2017-08-01 DIAGNOSIS — I110 Hypertensive heart disease with heart failure: Secondary | ICD-10-CM | POA: Diagnosis not present

## 2017-08-01 DIAGNOSIS — Z7901 Long term (current) use of anticoagulants: Secondary | ICD-10-CM | POA: Insufficient documentation

## 2017-08-01 DIAGNOSIS — Z950 Presence of cardiac pacemaker: Secondary | ICD-10-CM | POA: Diagnosis not present

## 2017-08-01 DIAGNOSIS — I482 Chronic atrial fibrillation: Secondary | ICD-10-CM | POA: Diagnosis not present

## 2017-08-01 DIAGNOSIS — E669 Obesity, unspecified: Secondary | ICD-10-CM | POA: Insufficient documentation

## 2017-08-01 DIAGNOSIS — I428 Other cardiomyopathies: Secondary | ICD-10-CM | POA: Insufficient documentation

## 2017-08-01 DIAGNOSIS — Z79899 Other long term (current) drug therapy: Secondary | ICD-10-CM | POA: Diagnosis not present

## 2017-08-01 DIAGNOSIS — I495 Sick sinus syndrome: Secondary | ICD-10-CM | POA: Diagnosis not present

## 2017-08-01 DIAGNOSIS — Z8049 Family history of malignant neoplasm of other genital organs: Secondary | ICD-10-CM | POA: Insufficient documentation

## 2017-08-01 DIAGNOSIS — Z880 Allergy status to penicillin: Secondary | ICD-10-CM | POA: Diagnosis not present

## 2017-08-01 DIAGNOSIS — G40409 Other generalized epilepsy and epileptic syndromes, not intractable, without status epilepticus: Secondary | ICD-10-CM | POA: Insufficient documentation

## 2017-08-01 DIAGNOSIS — Z823 Family history of stroke: Secondary | ICD-10-CM | POA: Insufficient documentation

## 2017-08-01 DIAGNOSIS — Z87891 Personal history of nicotine dependence: Secondary | ICD-10-CM | POA: Diagnosis not present

## 2017-08-01 DIAGNOSIS — Z833 Family history of diabetes mellitus: Secondary | ICD-10-CM | POA: Insufficient documentation

## 2017-08-01 DIAGNOSIS — I481 Persistent atrial fibrillation: Secondary | ICD-10-CM | POA: Diagnosis not present

## 2017-08-01 DIAGNOSIS — M1990 Unspecified osteoarthritis, unspecified site: Secondary | ICD-10-CM | POA: Insufficient documentation

## 2017-08-01 DIAGNOSIS — I493 Ventricular premature depolarization: Secondary | ICD-10-CM | POA: Diagnosis not present

## 2017-08-01 DIAGNOSIS — Z8249 Family history of ischemic heart disease and other diseases of the circulatory system: Secondary | ICD-10-CM | POA: Diagnosis not present

## 2017-08-01 DIAGNOSIS — G4733 Obstructive sleep apnea (adult) (pediatric): Secondary | ICD-10-CM | POA: Insufficient documentation

## 2017-08-01 DIAGNOSIS — Z6830 Body mass index (BMI) 30.0-30.9, adult: Secondary | ICD-10-CM | POA: Insufficient documentation

## 2017-08-01 DIAGNOSIS — I4819 Other persistent atrial fibrillation: Secondary | ICD-10-CM

## 2017-08-01 DIAGNOSIS — I5022 Chronic systolic (congestive) heart failure: Secondary | ICD-10-CM | POA: Insufficient documentation

## 2017-08-01 LAB — BASIC METABOLIC PANEL
ANION GAP: 8 (ref 5–15)
BUN: 23 mg/dL — ABNORMAL HIGH (ref 6–20)
CALCIUM: 9.8 mg/dL (ref 8.9–10.3)
CO2: 35 mmol/L — AB (ref 22–32)
CREATININE: 0.93 mg/dL (ref 0.44–1.00)
Chloride: 97 mmol/L — ABNORMAL LOW (ref 101–111)
GFR calc non Af Amer: >60 mL/min (ref >60)
Glucose, Bld: 98 mg/dL (ref 65–99)
Potassium: 3.6 mmol/L (ref 3.5–5.1)
SODIUM: 140 mmol/L (ref 135–145)

## 2017-08-01 LAB — CBC
HCT: 44.6 % (ref 36.0–46.0)
HEMOGLOBIN: 14.6 g/dL (ref 12.0–15.0)
MCH: 32.2 pg (ref 26.0–34.0)
MCHC: 32.7 g/dL (ref 30.0–36.0)
MCV: 98.5 fL (ref 78.0–100.0)
PLATELETS: 110 10*3/uL — AB (ref 150–400)
RBC: 4.53 MIL/uL (ref 3.87–5.11)
RDW: 13.8 % (ref 11.5–15.5)
WBC: 5.1 10*3/uL (ref 4.0–10.5)

## 2017-08-01 NOTE — Progress Notes (Signed)
ADVANCED HF CLINIC NOTE Primary HF: Dr. Gala Romney   HPI: Misty Gray is a 66 y/o woman with history of morbid obesity, OSA, chronic atrial fibrillation with tachy-brady syndrome s/p PPM, frequent PVCs, systolic HF due to NICM.  Admitted for ADHF in 10/17. Cath with normal coronary arteries and elevated filling pressures. Echo 10/17 EF 30-35% (previously 35-40%). 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 (She has 81% RV pacing and 15-20 PVCs per minute). Started on po amio with suppression of PVCs. Diuresed 41 pounds. Discharge weight 197.   Admitted 12/14 through 12/06/2016 with marked volume overload. Diuresed with IV lasix + metolazone. Discharge weight was 195 pounds.Transitioned to torsemide 40 mg in am and 20 mg in pm.   She presents today for HF follow up. Weights at home 154-156 pounds. Remains SOB with climbing stairs, this is her baseline, SOB with walking through the grocery store. Chronic 2 pillow orthopnea, denies PND. Not wearing CPAP at night, when she plugs it in it blows the fuse in her home. Eating some high sodium foods, drinking more than 2L a day. Followed by paramedicine.   Echo 3/18 reviewed personally. EF 15-20% moderate MR/TR  RV modrately HK   R/LHC 10/03/16 Ao = 94/58 (72) LV = 104/20 RA = 21 RV = 65/4/19 PA = 65/21 (38) PCW = 23 Fick cardiac output/index = 6.1/2.9 PVR = 2.5 WU FA sat = 97%  PA sat = 69%, 70%  Assessment: 1. Normal coronary arteries 2. NICM with EF 30-35% by echo 3. Persistently elevated biventricular pressures    Past Medical History:  Diagnosis Date  . Abnormal CT of the head 12/16/1984   R parietal atrophy  . Allergic rhinitis, cause unspecified   . Altered mental status 11/28/2016  . Anemia   . Arthritis    "hands and knees" (09/06/2015)  . Atrial fibrillation (HCC)   . Cellulitis 09/07/2015  . Cellulitis and abscess of leg 09/2016   bilateral  . CHF (congestive heart failure) (HCC)   . Diaphragmatic hernia without mention  of obstruction or gangrene   . Dyspnea   . Exertional dyspnea 06/22/12  . Family history of adverse reaction to anesthesia    "daughter fights w/them; just can't relax during OR; stay awake during the OR"  . Fatigue 06/22/2012  . Female stress incontinence   . GERD (gastroesophageal reflux disease)   . Grand mal seizure (HCC)   . H/O hiatal hernia    two removed  . Heart murmur   . High cholesterol   . History of blood transfusion 1956   "related to nose bleed"  . Hypertension   . Influenza A 01/11/2014  . Lichenification and lichen simplex chronicus   . Migraine    "when I was a teenager"  . Morbid obesity (HCC)   . Nonischemic cardiomyopathy (HCC)    EF has normalized-repeat Pending   . On home oxygen therapy    "2L when I'm asleep in bed" (09/06/2015)  . OSA on CPAP   . Pacemaker  st judes    Patient states "this is my third pacemaker"  . Pneumonia 2004; 2011; 11/2011  . Seizures (HCC) since 1981   "can hear you talking but sounds like you are in big tunnel; have them often if not taking RX; last one was 06/17/12" (09/06/2015)  . Shortness of breath 10/24/2010   Qualifier: Diagnosis of  By: [place], Bonnie    . Sick sinus syndrome (HCC)    WITH PRIOR DDD PACEMAKER IMPLANTATION  .  Somnolence   . Stroke [facility]) 1988   "mouth drawed real bad on my left side; found out it was a seizure"  . Syncope and collapse 06/22/12   "hit forehead and left knee"; denies loss of consciousness  . Unspecified venous (peripheral) insufficiency     Current Outpatient Prescriptions  Medication Sig Dispense Refill  . amiodarone (PACERONE) 200 MG tablet Take 1 tablet (200 mg total) by mouth daily. 30 tablet 3  . cholecalciferol (VITAMIN D) 1000 units tablet Take 1 tablet (1,000 Units total) by mouth daily. 90 tablet 3  . edoxaban (SAVAYSA) 60 MG TABS tablet Take 60 mg by mouth daily. 30 tablet 11  . fluticasone (FLONASE) 50 MCG/ACT nasal spray Place 2 sprays into both nostrils daily as needed. For  congestion. 16 g 1  . omeprazole (PRILOSEC) 20 MG capsule Take 1 capsule (20 mg total) by mouth daily. 90 capsule 1  . PHENobarbital (LUMINAL) 32.4 MG tablet Take 2 tablets (64.8 mg total) by mouth 2 (two) times daily. 120 tablet 5  . potassium chloride (K-DUR,KLOR-CON) 10 MEQ tablet Take 4 tablets (40 mEq total) by mouth 2 (two) times daily. 240 tablet 3  . torsemide (DEMADEX) 20 MG tablet Take 1-2 tablets (20-40 mg total) by mouth as directed. Take 40 mg in the AM and 20 mg in the PM 90 tablet 6  . metolazone (ZAROXOLYN) 2.5 MG tablet Take 1 tablet (2.5 mg total) by mouth as needed (weight gain or shortness of breath). Take as directed (Patient not taking: Reported on 07/31/2017) 15 tablet 1  . OXYGEN Inhale 2 L into the lungs at bedtime. cpap machine     No current facility-administered medications for this encounter.     Allergies  Allergen Reactions  . Aspirin Hives and Rash  . Penicillins Rash and Other (See Comments)    Amoxicillin also, Has patient had a PCN reaction causing immediate rash, facial/tongue/throat swelling, SOB or lightheadedness with hypotension: Yes Has patient had a PCN reaction causing severe rash involving mucus membranes or skin necrosis: No Has patient had a PCN reaction that required hospitalization No Has patient had a PCN reaction occurring within the last 10 years: Yes If all of the above answers are "NO", then may proceed with Cephalosporin use.   . Tape Other (See Comments)    NO PAPER TAPE-MUST BE MEDICAL TAPE - unknown reaction  . Wool Alcohol [Lanolin] Hives  . Amoxicillin Rash  . Ampicillin Itching and Rash      Social History   Social History  . Marital status: Married    Spouse name: [name]  . Number of children: 4  . Years of education: 12   Occupational History  . disabled    Social History Main Topics  . Smoking status: Former Smoker    Packs/day: 1.00    Years: 7.00    Types: Cigarettes    Quit date: 03/16/1969  . Smokeless  tobacco: Never Used  . Alcohol use No  . Drug use: No  . Sexual activity: Not Currently    Birth control/ protection: Post-menopausal   Other Topics Concern  . Not on file   Social History Narrative   Lives with husband [name] lives with them as do his 2 children   Daughter, [name], and her 3 children live in [place] -  [name] is incarcerated   Nephew, Patrise Williamsen lives with them   Daughter, [name], in [place]  [name]   Moved June 2013 to a better home on [facility].      Lives with husband, son, grandson, granddaughter, nephew.      Family History  Problem Relation Age of Onset  . Stroke Father        died from CAD?  Marland Kitchen Heart disease Father   . Cancer Mother   . Diabetes Son        Type 1  . Sleep apnea Son   . Cancer Sister        cervical  . Hypertension Sister   . Hypertension Brother   . Rheum arthritis Daughter        Also PGM, PGGM    Vitals:   08/01/17 0955  BP: 122/66  Pulse: 67  SpO2: 100%  Weight: 155 lb (70.3 kg)   Wt Readings from Last 3 Encounters:  08/01/17 155 lb (70.3 kg)  07/31/17 154 lb (69.9 kg)  07/24/17 158 lb 6.4 oz (71.8 kg)   PHYSICAL EXAM:  General: Elderly female, NAD.  HEENT: poor dentition Neck: supple. JVP flat Carotids 2+ bilat; no bruits. No lymphadenopathy or thryomegaly appreciated. Cor: PMI nondisplaced. Irregular rate and rhythm. 1/6 SEM.  Lungs: Clear bilaterally. Normal effort.  Abdomen: Obese, soft, non tender, non distended.  No hepatosplenomegaly. No bruits or masses. Good bowel sounds. Extremities: no cyanosis, clubbing, rash. No peripheral edema. Legs with scabs, also bandage intact on R calf.  Neuro: alert & orientedx3, cranial nerves grossly intact. moves all 4 extremities w/o difficulty. Affect pleasant   ASSESSMENT & PLAN: 1. Chronic systolic HF due to NICM. ? PVC induced vs RV pacing. Echo 12/06/2016 EF 10-15%. Cath 10/17 normal coronary  arteries.  Echo 02/2017 with LVEF 15-20% despite PVC suppression. Echo today EF 15-20%.  - NYHA III - Volume status stable on exam. - Continue toresemide 40mg  in the am, 20 mg in the pm.  - Unable to tolerate beta blocker.  - Has failed losartan 12.5 mg hs due to hypotension.  - Has been seen by EP. Not candidate for ICD or CRT - Reinforced dietary and fluid restrictions.    2. Frequent PVCs - Continue Amio 200 mg daily.    3. Obesity  - Encouraged her to increase activity   4. Chronic AF with tach brady s/p pacemaker - Continue edoxaban. Denies melena and hematochezia.  - Device interrogation shows 15% V pacing. Previously around 41%. Much improved.   CMET and CBC today.   Misty Ishikawa, NP  10:20 AM   Patient seen and examined with Suzzette Righter, NP. We discussed all aspects of the encounter. I agree with the assessment and plan as stated above.   Echo reviewed personally. EF still ~20% that said, she is much improved with Paramedicine following. Volume status finally stable. Stable NYHA III. Intolerant of most HF meds. Discussed with EP and they feel she is not acceptable candidate for upgrade of PPM to CRT-P or CRT-D. PPM interrogated personally RV pacing much reduced with device reprogramming. PVCs suppressed with amio. Labs today.  Arvilla Meres, MD  9:11 PM

## 2017-08-01 NOTE — Progress Notes (Signed)
  Echocardiogram 2D Echocardiogram has been performed.  Pieter Partridge 08/01/2017, 9:51 AM

## 2017-08-01 NOTE — Patient Instructions (Signed)
Labs today  Your physician recommends that you schedule a follow-up appointment in: 3 months  

## 2017-08-06 ENCOUNTER — Other Ambulatory Visit (HOSPITAL_COMMUNITY): Payer: Self-pay

## 2017-08-06 NOTE — Progress Notes (Signed)
Paramedicine Encounter    Patient ID: Misty Gray, female    DOB: 1951/11/14, 66 y.o.   MRN: [id number]   Patient Care Team: Latrelle Dodrill, MD as PCP - General (Family Medicine) [name], MD (Cardiology)  Patient Active Problem List   Diagnosis Date Noted  . Skin irritation 07/23/2017  . Chronic venous stasis dermatitis 12/26/2016  . Urinary tract infection without hematuria   . Frequent PVCs   . Hypokalemia   . Housing problems 11/23/2016  . Chronic atrial fibrillation (HCC) 11/01/2016  . Hypotension 10/31/2016  . Special screening for malignant neoplasms, colon 08/12/2016  . Heme positive stool 08/09/2016  . Intertrigo 08/09/2016  . Chronic anticoagulation 04/26/2016  . Chronic venous insufficiency   . Preventative health care 10/05/2014  . Breast mass, left 10/05/2014  . Osteopenia 09/15/2014  . Hypertension 01/04/2014  . Chronic systolic dysfunction of left ventricle 05/05/2013  . At high risk for falls 02/11/2013  . Vitamin D deficiency 07/15/2012  . Sinoatrial node dysfunction (HCC) 05/08/2011  . Allergic rhinitis 05/31/2008  . Morbid obesity (HCC) 02/12/2007  . GASTROESOPHAGEAL REFLUX, NO ESOPHAGITIS 02/12/2007  . Female stress incontinence 02/12/2007  . Seizure disorder (HCC) 02/12/2007  . OSA (obstructive sleep apnea) 02/12/2007    Current Outpatient Prescriptions:  .  amiodarone (PACERONE) 200 MG tablet, Take 1 tablet (200 mg total) by mouth daily., Disp: 30 tablet, Rfl: 3 .  cholecalciferol (VITAMIN D) 1000 units tablet, Take 1 tablet (1,000 Units total) by mouth daily., Disp: 90 tablet, Rfl: 3 .  edoxaban (SAVAYSA) 60 MG TABS tablet, Take 60 mg by mouth daily., Disp: 30 tablet, Rfl: 11 .  omeprazole (PRILOSEC) 20 MG capsule, Take 1 capsule (20 mg total) by mouth daily., Disp: 90 capsule, Rfl: 1 .  PHENobarbital (LUMINAL) 32.4 MG tablet, Take 2 tablets (64.8 mg total) by mouth 2 (two) times daily., Disp: 120 tablet, Rfl: 5 .  potassium chloride  (K-DUR,KLOR-CON) 10 MEQ tablet, Take 4 tablets (40 mEq total) by mouth 2 (two) times daily., Disp: 240 tablet, Rfl: 3 .  torsemide (DEMADEX) 20 MG tablet, Take 1-2 tablets (20-40 mg total) by mouth as directed. Take 40 mg in the AM and 20 mg in the PM, Disp: 90 tablet, Rfl: 6 .  fluticasone (FLONASE) 50 MCG/ACT nasal spray, Place 2 sprays into both nostrils daily as needed. For congestion. (Patient not taking: Reported on 08/06/2017), Disp: 16 g, Rfl: 1 .  metolazone (ZAROXOLYN) 2.5 MG tablet, Take 1 tablet (2.5 mg total) by mouth as needed (weight gain or shortness of breath). Take as directed (Patient not taking: Reported on 07/31/2017), Disp: 15 tablet, Rfl: 1 .  OXYGEN, Inhale 2 L into the lungs at bedtime. cpap machine, Disp: , Rfl:  Allergies  Allergen Reactions  . Aspirin Hives and Rash  . Penicillins Rash and Other (See Comments)    Amoxicillin also, Has patient had a PCN reaction causing immediate rash, facial/tongue/throat swelling, SOB or lightheadedness with hypotension: Yes Has patient had a PCN reaction causing severe rash involving mucus membranes or skin necrosis: No Has patient had a PCN reaction that required hospitalization No Has patient had a PCN reaction occurring within the last 10 years: Yes If all of the above answers are "NO", then may proceed with Cephalosporin use.   . Tape Other (See Comments)    NO PAPER TAPE-MUST BE MEDICAL TAPE - unknown reaction  . Wool Alcohol [Lanolin] Hives  . Amoxicillin Rash  . Ampicillin Itching and Rash  Social History   Social History  . Marital status: Married    Spouse name: [name]  . Number of children: 4  . Years of education: 12   Occupational History  . disabled    Social History Main Topics  . Smoking status: Former Smoker    Packs/day: 1.00    Years: 7.00    Types: Cigarettes    Quit date: 03/16/1969  . Smokeless tobacco: Never Used  . Alcohol use No  . Drug use: No  . Sexual activity: Not Currently     Birth control/ protection: Post-menopausal   Other Topics Concern  . Not on file   Social History Narrative   Lives with husband [name] lives with them as do his 2 children   Daughter, [name], and her 3 children live in [place] -  [name] is incarcerated   Nephew, Graciella Cremer lives with them   Daughter, [name], in [name] [address]   Moved June 2013 to a better home on [facility].      Lives with husband, son, grandson, granddaughter, nephew.    Physical Exam      Future Appointments Date Time Provider Department Center  08/11/2017 11:30 AM [name], DPM [facility]  08/15/2017 11:30 AM Latrelle Dodrill, MD [facility]  11/04/2017 1:40 PM [name], [name], MD [facility] None   BP 102/64   Pulse 74   Resp 16   Wt 155 lb (70.3 kg)   SpO2 98%   BMI 30.27 kg/m  Weight yesterday-152.7  Last visit weight-154  Pt reports she is feeling well, no changes from her last visit from clinic. I repeatedly talk to them about her diet and fluid intake. Due to their income and on fixed budget its hard for her to eat like she should. No missed doses on her meds this week.  We have discussed her moving to a packaged bubble pack delivery pharmacy and she is very interested plus with the free gift card up to 5 rx to [facility]. I feel she has done well so far and have not been to hosp in quite some time and she will be able to be d/c from paramedicine program soon and this will be a good transition for her to aid in her meds.  Her weight is up 3 lbs from yesterday. She states she is able to take additional torsemide if necessary, so she is going to do that.  Her landlord hasnt fixed the fuses yet so she can use her 45.  We buy used houses is her landlord.  meds verified and pill box refilled.  Lungs clear. No swelling, she has multiple scratches on her arms and legs-advised her to f/u with PCP if it  doesn't continue to heal soon.   ACTION: Home visit completed  Kerry Hough, EMT-Paramedic 08/06/17

## 2017-08-11 ENCOUNTER — Ambulatory Visit: Payer: Medicare HMO | Admitting: Podiatry

## 2017-08-13 ENCOUNTER — Other Ambulatory Visit (HOSPITAL_COMMUNITY): Payer: Self-pay

## 2017-08-13 NOTE — Progress Notes (Signed)
Paramedicine Encounter    Patient ID: Misty Gray, female    DOB: 1951/11/14, 66 y.o.   MRN: [id number]   Patient Care Team: Latrelle Dodrill, MD as PCP - General (Family Medicine) [name], MD (Cardiology)  Patient Active Problem List   Diagnosis Date Noted  . Skin irritation 07/23/2017  . Chronic venous stasis dermatitis 12/26/2016  . Urinary tract infection without hematuria   . Frequent PVCs   . Hypokalemia   . Housing problems 11/23/2016  . Chronic atrial fibrillation (HCC) 11/01/2016  . Hypotension 10/31/2016  . Special screening for malignant neoplasms, colon 08/12/2016  . Heme positive stool 08/09/2016  . Intertrigo 08/09/2016  . Chronic anticoagulation 04/26/2016  . Chronic venous insufficiency   . Preventative health care 10/05/2014  . Breast mass, left 10/05/2014  . Osteopenia 09/15/2014  . Hypertension 01/04/2014  . Chronic systolic dysfunction of left ventricle 05/05/2013  . At high risk for falls 02/11/2013  . Vitamin D deficiency 07/15/2012  . Sinoatrial node dysfunction (HCC) 05/08/2011  . Allergic rhinitis 05/31/2008  . Morbid obesity (HCC) 02/12/2007  . GASTROESOPHAGEAL REFLUX, NO ESOPHAGITIS 02/12/2007  . Female stress incontinence 02/12/2007  . Seizure disorder (HCC) 02/12/2007  . OSA (obstructive sleep apnea) 02/12/2007    Current Outpatient Prescriptions:  .  amiodarone (PACERONE) 200 MG tablet, Take 1 tablet (200 mg total) by mouth daily., Disp: 30 tablet, Rfl: 3 .  cholecalciferol (VITAMIN D) 1000 units tablet, Take 1 tablet (1,000 Units total) by mouth daily., Disp: 90 tablet, Rfl: 3 .  edoxaban (SAVAYSA) 60 MG TABS tablet, Take 60 mg by mouth daily., Disp: 30 tablet, Rfl: 11 .  omeprazole (PRILOSEC) 20 MG capsule, Take 1 capsule (20 mg total) by mouth daily., Disp: 90 capsule, Rfl: 1 .  PHENobarbital (LUMINAL) 32.4 MG tablet, Take 2 tablets (64.8 mg total) by mouth 2 (two) times daily., Disp: 120 tablet, Rfl: 5 .  potassium chloride  (K-DUR,KLOR-CON) 10 MEQ tablet, Take 4 tablets (40 mEq total) by mouth 2 (two) times daily., Disp: 240 tablet, Rfl: 3 .  torsemide (DEMADEX) 20 MG tablet, Take 1-2 tablets (20-40 mg total) by mouth as directed. Take 40 mg in the AM and 20 mg in the PM, Disp: 90 tablet, Rfl: 6 .  fluticasone (FLONASE) 50 MCG/ACT nasal spray, Place 2 sprays into both nostrils daily as needed. For congestion. (Patient not taking: Reported on 08/06/2017), Disp: 16 g, Rfl: 1 .  metolazone (ZAROXOLYN) 2.5 MG tablet, Take 1 tablet (2.5 mg total) by mouth as needed (weight gain or shortness of breath). Take as directed (Patient not taking: Reported on 07/31/2017), Disp: 15 tablet, Rfl: 1 .  OXYGEN, Inhale 2 L into the lungs at bedtime. cpap machine, Disp: , Rfl:  Allergies  Allergen Reactions  . Aspirin Hives and Rash  . Penicillins Rash and Other (See Comments)    Amoxicillin also, Has patient had a PCN reaction causing immediate rash, facial/tongue/throat swelling, SOB or lightheadedness with hypotension: Yes Has patient had a PCN reaction causing severe rash involving mucus membranes or skin necrosis: No Has patient had a PCN reaction that required hospitalization No Has patient had a PCN reaction occurring within the last 10 years: Yes If all of the above answers are "NO", then may proceed with Cephalosporin use.   . Tape Other (See Comments)    NO PAPER TAPE-MUST BE MEDICAL TAPE - unknown reaction  . Wool Alcohol [Lanolin] Hives  . Amoxicillin Rash  . Ampicillin Itching and Rash  Social History   Social History  . Marital status: Married    Spouse name: [name]  . Number of children: 4  . Years of education: 12   Occupational History  . disabled    Social History Main Topics  . Smoking status: Former Smoker    Packs/day: 1.00    Years: 7.00    Types: Cigarettes    Quit date: 03/16/1969  . Smokeless tobacco: Never Used  . Alcohol use No  . Drug use: No  . Sexual activity: Not Currently     Birth control/ protection: Post-menopausal   Other Topics Concern  . Not on file   Social History Narrative   Lives with husband [name] lives with them as do his 2 children   Daughter, [name], and her 3 children live in [place] -  [name] is incarcerated   Nephew, [place] lives with them   Daughter, [name], in [name] [address]   Moved June 2013 to a better home on [facility].      Lives with husband, son, grandson, granddaughter, nephew.    Physical Exam      Future Appointments Date Time Provider Department Center  08/15/2017 11:30 AM Latrelle Dodrill, MD [facility]  08/28/2017 3:30 PM [name], DPM [facility]  11/04/2017 1:40 PM [name], [name], MD [facility] None   BP 102/64   Pulse 64   Resp 15   Wt 154 lb (69.9 kg)   SpO2 98%   BMI 30.08 kg/m  Weight yesterday-154 Last visit weight-155  Pt reports she is doing ok, no increased sob, she has a small amount of edema to her lower extremities. Her weight is staying at the elevated weight as of last week. She states she isnt eating a lot of sodium however she did eat some pizza the other night but it was a small piece. She states not going over her daily fluid intake.  We have her Rx moved over to [facility] pharmacy now. She is starting the bubble packs of meds-she only has one week for now due to the pharmacy not having enough phenobarbitol-[facility] so they will order and deliver it tomor and I will come get it and take it to [facility] pharmacy so she can have the other 3wks. No missed doses of her meds. Reviewed with pt how to use the bubble pack of meds. She seems to understand.   She is c/o pain to her hernia-she is going to see PCP regarding that.   ACTION: Home visit completed  Kerry Hough, EMT-Paramedic 08/13/17

## 2017-08-15 ENCOUNTER — Ambulatory Visit
Admission: RE | Admit: 2017-08-15 | Discharge: 2017-08-15 | Disposition: A | Discharge status: Home / Self Care | Payer: Medicare HMO | Source: Ambulatory Visit | Attending: Family Medicine | Admitting: Family Medicine

## 2017-08-15 ENCOUNTER — Encounter: Payer: Self-pay | Admitting: Family Medicine

## 2017-08-15 ENCOUNTER — Telehealth: Payer: Self-pay | Admitting: Family Medicine

## 2017-08-15 ENCOUNTER — Ambulatory Visit (INDEPENDENT_AMBULATORY_CARE_PROVIDER_SITE_OTHER): Payer: Medicare HMO | Admitting: Family Medicine

## 2017-08-15 VITALS — BP 122/78 | HR 46 | Temp 97.6°F | Ht 60.0 in | Wt 159.0 lb

## 2017-08-15 DIAGNOSIS — R238 Other skin changes: Secondary | ICD-10-CM

## 2017-08-15 DIAGNOSIS — G4733 Obstructive sleep apnea (adult) (pediatric): Secondary | ICD-10-CM | POA: Diagnosis not present

## 2017-08-15 DIAGNOSIS — Z5181 Encounter for therapeutic drug level monitoring: Secondary | ICD-10-CM

## 2017-08-15 DIAGNOSIS — K439 Ventral hernia without obstruction or gangrene: Secondary | ICD-10-CM | POA: Diagnosis not present

## 2017-08-15 DIAGNOSIS — S99921A Unspecified injury of right foot, initial encounter: Secondary | ICD-10-CM | POA: Diagnosis not present

## 2017-08-15 DIAGNOSIS — M79674 Pain in right toe(s): Secondary | ICD-10-CM

## 2017-08-15 DIAGNOSIS — Z8719 Personal history of other diseases of the digestive system: Secondary | ICD-10-CM

## 2017-08-15 DIAGNOSIS — Z9889 Other specified postprocedural states: Principal | ICD-10-CM

## 2017-08-15 DIAGNOSIS — G40909 Epilepsy, unspecified, not intractable, without status epilepticus: Secondary | ICD-10-CM

## 2017-08-15 NOTE — Assessment & Plan Note (Signed)
We'll update sleep study. If her sleep study does show that she continues to need a CPAP, I will write her a letter to give to her landlord asking that they fix electrical issues and her home so that she can safely use her CPAP.

## 2017-08-15 NOTE — Patient Instructions (Addendum)
It was nice to meet you today!  Go get xray of your foot today to check for fracture  Checking CT scan of your belly  Checking liver function today  Setting you up for sleep study  Follow up with me in 4-6 weeks, sooner if needed  Be well, Dr. Pollie Meyer

## 2017-08-15 NOTE — Assessment & Plan Note (Signed)
Given this is chronic and nonhealing, she may ultimately need biopsy of the skin here. I'm checking a CT scan of her abdomen first. We'll decide on need for biopsy at future visit.

## 2017-08-15 NOTE — Telephone Encounter (Signed)
Attempted to reach patient to discuss xray of foot. No answer. LVM asking her to call back.  When patient returns the call please let her know: - xray of foot did not show any fractures. - she can wear the rigid sole shoe if it helps things feel more comfortable - can take tylenol as needed for pain - call if things are getting worse and not better.  Latrelle Dodrill, MD

## 2017-08-15 NOTE — Assessment & Plan Note (Signed)
Stable on phenobarbital. I will check CMET today to monitor LFTs. Has had recent stable CBC.

## 2017-08-15 NOTE — Progress Notes (Addendum)
Date of Visit: 08/15/2017   HPI:  Patient presents to meet her new doctor and follow-up. She was recently reassigned to me after her prior PCP Dr. Randolm Idol left our practice.  Seizure disorder: Takes phenobarbital 64.8 mg twice daily for this. This was prescribed by prior PCP. States she is to see a neurologist but has not been some time. No seizure since starting the phenobarbital. It controls her seizure disorder well.  Foot pain: 2 days ago she dropped a scale on her foot. Had bruising and swelling of the right fourth toe. It is painful to walk on. She is concerned she may have broken her toe.  Skin irritation/weight loss/Ventral hernia: Has an area of skin irritation on the anterior aspect of her stomach that has been chronic and nonhealing. She reports this has been going on ever since she was hospitalized at the end of December last year. Since then she has lost a lot of weight, but does note she has been following a diet as recommended by her cardiologist. Also has a large anterior ventral hernia. Denies any blood in her stool, but did have a positive fecal blood test last year. She was subsequently seen by gastroenterology who felt she was not a candidate for colonoscopy due to her medical comorbidities. There is no further workup planned. She did have a recent CBC which showed stable hemoglobin with no anemia. Has not had any recent imaging of her abdomen.  OSA: Is supposed to use a CPAP machine, but is unable to use it at her home as every time she collected and it trips diffuse. She has tried to get her landlord to fix this, but they have not done anything. Last sleep study was approximately 15 years ago. Willing to get another sleep study.  ROS: See HPI.  PMFSH: history of systolic CHF, chronic anticoagulation, chronic afib, chronic venous stasis dermatitis, PVCs, hypertension, morbid obesity, OSA, GERD, seizure disorder, osteopenia  PHYSICAL EXAM: BP 122/78   Pulse (!) 46   Temp 97.6  F (36.4 C) (Oral)   Ht 5' (1.524 m)   Wt 159 lb (72.1 kg)   SpO2 90%   BMI 31.05 kg/m  recheck of pulse 57 Gen: No acute distress, pleasant, cooperative HEENT: Normocephalic, atraumatic Heart: Regular rate and rhythm, no murmur Lungs: Clear bilaterally, normal effort Abdomen: Soft, nontender to palpation. Area of chronic scabbing/poor healing noted in prior vertical skin incision scar. Large protuberant abdominal hernia in the left upper quadrant but initially seems like a mass, but is reducible on palpation. It is nontender to palpation. Neuro: Speech normal Ext: 2+ bilateral lower extremity edema. Areas of scabbing on bilateral lower extremities. Right fourth toe is tender to palpation and has some bruising and swelling. Able to move all toes. Sensation intact. 2+ DP pulse on the right.  ASSESSMENT/PLAN:  Health maintenance:  -Offered flu shot to patient today, but pt declined.  -will plan to repeat stool cards at next office visit since she is not a candidate for colonoscopy  Right foot/toe injury: Concern for fracture of toe. Patient is able to bear weight. Plan: -X-ray of foot to evaluate for fracture -Given rigid sole shoe today to aid in healing and mobility   OSA (obstructive sleep apnea) We'll update sleep study. If her sleep study does show that she continues to need a CPAP, I will write her a letter to give to her landlord asking that they fix electrical issues and her home so that she can safely use  her CPAP.  Ventral hernia without obstruction or gangrene Large abdominal mass on exam, which on palpation seems more to be a large ventral hernia. I do think further workup is indicated given her weight loss since December, area of chronic nonhealing on the skin, and positive fecal occult blood test last year. Recent CBC stable. She was deemed not to be a candidate for colonoscopy by gastroenterology. Plan:  -CT abdomen pelvis with contrast -If CT unremarkable in area on  skin persists, I anticipate she may need biopsy of the skin to rule out a chronic squamous cell carcinoma.  Skin irritation Given this is chronic and nonhealing, she may ultimately need biopsy of the skin here. I'm checking a CT scan of her abdomen first. We'll decide on need for biopsy at future visit.  Seizure disorder (HCC) Stable on phenobarbital. I will check CMET today to monitor LFTs. Has had recent stable CBC.  FOLLOW UP: Follow up in 4-6 weeks with me for chronic medical issues  [place] J. Pollie Meyer, MD [facility]

## 2017-08-15 NOTE — Assessment & Plan Note (Addendum)
Large abdominal mass on exam, which on palpation seems more to be a large ventral hernia. I do think further workup is indicated given her weight loss since December, area of chronic nonhealing on the skin, and positive fecal occult blood test last year. Recent CBC stable. She was deemed not to be a candidate for colonoscopy by gastroenterology. Plan:  -CT abdomen pelvis with contrast -If CT unremarkable in area on skin persists, I anticipate she may need biopsy of the skin to rule out a chronic squamous cell carcinoma.

## 2017-08-16 DIAGNOSIS — I279 Pulmonary heart disease, unspecified: Secondary | ICD-10-CM | POA: Diagnosis not present

## 2017-08-16 DIAGNOSIS — Q871 Congenital malformation syndromes predominantly associated with short stature: Secondary | ICD-10-CM | POA: Diagnosis not present

## 2017-08-16 DIAGNOSIS — R0902 Hypoxemia: Secondary | ICD-10-CM | POA: Diagnosis not present

## 2017-08-16 DIAGNOSIS — G4733 Obstructive sleep apnea (adult) (pediatric): Secondary | ICD-10-CM | POA: Diagnosis not present

## 2017-08-16 LAB — CMP14+EGFR
ALBUMIN: 3.9 g/dL (ref 3.6–4.8)
ALK PHOS: 132 IU/L — AB (ref 39–117)
ALT: 18 IU/L (ref 0–32)
AST: 31 IU/L (ref 0–40)
Albumin/Globulin Ratio: 1 — ABNORMAL LOW (ref 1.2–2.2)
BUN / CREAT RATIO: 29 — AB (ref 12–28)
BUN: 26 mg/dL (ref 8–27)
Bilirubin Total: 0.6 mg/dL (ref 0.0–1.2)
CALCIUM: 9.9 mg/dL (ref 8.7–10.3)
CO2: 29 mmol/L (ref 20–29)
CREATININE: 0.91 mg/dL (ref 0.57–1.00)
Chloride: 99 mmol/L (ref 96–106)
GFR calc Af Amer: 75 mL/min/{1.73_m2} (ref >59)
GFR, EST NON AFRICAN AMERICAN: 65 mL/min/{1.73_m2} (ref >59)
GLOBULIN, TOTAL: 3.9 g/dL (ref 1.5–4.5)
GLUCOSE: 92 mg/dL (ref 65–99)
Potassium: 3.9 mmol/L (ref 3.5–5.2)
SODIUM: 142 mmol/L (ref 134–144)
Total Protein: 7.8 g/dL (ref 6.0–8.5)

## 2017-08-20 ENCOUNTER — Other Ambulatory Visit (HOSPITAL_COMMUNITY): Payer: Self-pay

## 2017-08-20 NOTE — Progress Notes (Signed)
Paramedicine Encounter    Patient ID: Misty Gray, female    DOB: Feb 27, 1951, 66 y.o.   MRN: [id number]   Patient Care Team: Latrelle Dodrill, MD as PCP - General (Family Medicine) [name], MD (Cardiology)  Patient Active Problem List   Diagnosis Date Noted  . Ventral hernia without obstruction or gangrene 08/15/2017  . Skin irritation 07/23/2017  . Chronic venous stasis dermatitis 12/26/2016  . Urinary tract infection without hematuria   . Frequent PVCs   . Hypokalemia   . Housing problems 11/23/2016  . Chronic atrial fibrillation (HCC) 11/01/2016  . Hypotension 10/31/2016  . Special screening for malignant neoplasms, colon 08/12/2016  . Heme positive stool 08/09/2016  . Intertrigo 08/09/2016  . Chronic anticoagulation 04/26/2016  . Chronic venous insufficiency   . Preventative health care 10/05/2014  . Breast mass, left 10/05/2014  . Osteopenia 09/15/2014  . Hypertension 01/04/2014  . Chronic systolic dysfunction of left ventricle 05/05/2013  . At high risk for falls 02/11/2013  . Vitamin D deficiency 07/15/2012  . Sinoatrial node dysfunction (HCC) 05/08/2011  . Allergic rhinitis 05/31/2008  . Morbid obesity (HCC) 02/12/2007  . GASTROESOPHAGEAL REFLUX, NO ESOPHAGITIS 02/12/2007  . Female stress incontinence 02/12/2007  . Seizure disorder (HCC) 02/12/2007  . OSA (obstructive sleep apnea) 02/12/2007    Current Outpatient Prescriptions:  .  amiodarone (PACERONE) 200 MG tablet, Take 1 tablet (200 mg total) by mouth daily., Disp: 30 tablet, Rfl: 3 .  cholecalciferol (VITAMIN D) 1000 units tablet, Take 1 tablet (1,000 Units total) by mouth daily., Disp: 90 tablet, Rfl: 3 .  edoxaban (SAVAYSA) 60 MG TABS tablet, Take 60 mg by mouth daily., Disp: 30 tablet, Rfl: 11 .  omeprazole (PRILOSEC) 20 MG capsule, Take 1 capsule (20 mg total) by mouth daily., Disp: 90 capsule, Rfl: 1 .  PHENobarbital (LUMINAL) 32.4 MG tablet, Take 2 tablets (64.8 mg total) by mouth 2 (two)  times daily., Disp: 120 tablet, Rfl: 5 .  potassium chloride (K-DUR,KLOR-CON) 10 MEQ tablet, Take 4 tablets (40 mEq total) by mouth 2 (two) times daily., Disp: 240 tablet, Rfl: 3 .  torsemide (DEMADEX) 20 MG tablet, Take 1-2 tablets (20-40 mg total) by mouth as directed. Take 40 mg in the AM and 20 mg in the PM, Disp: 90 tablet, Rfl: 6 .  fluticasone (FLONASE) 50 MCG/ACT nasal spray, Place 2 sprays into both nostrils daily as needed. For congestion. (Patient not taking: Reported on 08/20/2017), Disp: 16 g, Rfl: 1 .  metolazone (ZAROXOLYN) 2.5 MG tablet, Take 1 tablet (2.5 mg total) by mouth as needed (weight gain or shortness of breath). Take as directed (Patient not taking: Reported on 07/31/2017), Disp: 15 tablet, Rfl: 1 .  OXYGEN, Inhale 2 L into the lungs at bedtime. cpap machine, Disp: , Rfl:  Allergies  Allergen Reactions  . Aspirin Hives and Rash  . Penicillins Rash and Other (See Comments)    Amoxicillin also, Has patient had a PCN reaction causing immediate rash, facial/tongue/throat swelling, SOB or lightheadedness with hypotension: Yes Has patient had a PCN reaction causing severe rash involving mucus membranes or skin necrosis: No Has patient had a PCN reaction that required hospitalization No Has patient had a PCN reaction occurring within the last 10 years: Yes If all of the above answers are "NO", then may proceed with Cephalosporin use.   . Tape Other (See Comments)    NO PAPER TAPE-MUST BE MEDICAL TAPE - unknown reaction  . Wool Alcohol [Lanolin] Hives  .  Amoxicillin Rash  . Ampicillin Itching and Rash     Social History   Social History  . Marital status: Married    Spouse name: [name]  . Number of children: 4  . Years of education: 12   Occupational History  . disabled    Social History Main Topics  . Smoking status: Former Smoker    Packs/day: 1.00    Years: 7.00    Types: Cigarettes    Quit date: 03/16/1969  . Smokeless tobacco: Never Used  . Alcohol use No   . Drug use: No  . Sexual activity: Not Currently    Birth control/ protection: Post-menopausal   Other Topics Concern  . Not on file   Social History Narrative   Lives with husband [name] lives with them as do his 2 children   Daughter, [name], and her 3 children live in [place] -  [name] is incarcerated   Nephew, Michela Herst lives with them   Daughter, [name], in [name] [address]   Moved June 2013 to a better home on [facility].      Lives with husband, son, grandson, granddaughter, nephew.    Physical Exam      Future Appointments Date Time Provider Department Center  08/21/2017 11:30 AM [facility] [place]  08/28/2017 3:30 PM [name], DPM [facility]  11/04/2017 1:40 PM [name], [name], MD [facility] None   BP 114/80   Pulse 64   Resp 15   Wt 158 lb (71.7 kg)   BMI 30.86 kg/m  Weight yesterday-156 Last visit weight-154  Came out today for med check-she got her bubble pack of meds delivered yesterday so I came out to confirm they are all placed in there.  Her vitamin D is not in the package. Her torsemide is in the evening dose-so I will ask them again to place it in the afternoon slot.  Her weight is up 4 lbs from last week. She ate cheeseburger and some fries last night for dinner. Advised her to monitor her foods more, no missed doses of her meds. Called the pharmacy and she is able to place the torsemide in the noon time and the other evening doses in the bedtime and the vit d was in there it just wasn't labeled on the package.    ACTION: Home visit completed  Kerry Hough, EMT-Paramedic 08/20/17

## 2017-08-21 ENCOUNTER — Ambulatory Visit (HOSPITAL_COMMUNITY)
Admission: RE | Admit: 2017-08-21 | Discharge: 2017-08-21 | Disposition: A | Discharge status: Home / Self Care | Payer: Medicare HMO | Source: Ambulatory Visit | Attending: Family Medicine | Admitting: Family Medicine

## 2017-08-21 DIAGNOSIS — K439 Ventral hernia without obstruction or gangrene: Secondary | ICD-10-CM | POA: Diagnosis present

## 2017-08-21 DIAGNOSIS — J90 Pleural effusion, not elsewhere classified: Secondary | ICD-10-CM | POA: Diagnosis not present

## 2017-08-21 DIAGNOSIS — Z9889 Other specified postprocedural states: Secondary | ICD-10-CM | POA: Insufficient documentation

## 2017-08-21 DIAGNOSIS — K8020 Calculus of gallbladder without cholecystitis without obstruction: Secondary | ICD-10-CM | POA: Insufficient documentation

## 2017-08-21 MED ORDER — IOPAMIDOL (ISOVUE-300) INJECTION 61%
INTRAVENOUS | Status: AC
  Filled 2017-08-21: qty 100

## 2017-08-21 MED ORDER — IOPAMIDOL (ISOVUE-300) INJECTION 61%
100.0000 mL | Freq: Once | INTRAVENOUS | Status: AC | PRN
  Administered 2017-08-21: 100 mL via INTRAVENOUS

## 2017-08-25 NOTE — Telephone Encounter (Signed)
Called patient again to discuss CT scan & xray.  Foot: She got my letter about her xray results just today. Toe is still swollen and painful. Not any better. Not necessarily worse. Offered appointment for her to come back in & have it reexamined but she says she cannot do this because money is tight. She will come in or go to ED if it gets worse. No fevers.  CT scan: no significant abdominal pain. CT showed possible fluid collection/seroma around her prior ventral hernia site. Agreeable with seeing general surgery for follow up. Will enter referral.  Patient appreciative.  Latrelle Dodrill, MD

## 2017-08-27 ENCOUNTER — Other Ambulatory Visit (HOSPITAL_COMMUNITY): Payer: Self-pay

## 2017-08-27 NOTE — Progress Notes (Signed)
chfParamedicine Encounter    Patient ID: Misty Gray, female    DOB: December 06, 1951, 66 y.o.   MRN: [id number]   Patient Care Team: Misty Dodrill, MD as PCP - General (Family Medicine) Misty Range, MD (Cardiology)  Patient Active Problem List   Diagnosis Date Noted  . Ventral hernia without obstruction or gangrene 08/15/2017  . Skin irritation 07/23/2017  . Chronic venous stasis dermatitis 12/26/2016  . Urinary tract infection without hematuria   . Frequent PVCs   . Hypokalemia   . Housing problems 11/23/2016  . Chronic atrial fibrillation (HCC) 11/01/2016  . Hypotension 10/31/2016  . Special screening for malignant neoplasms, colon 08/12/2016  . Heme positive stool 08/09/2016  . Intertrigo 08/09/2016  . Chronic anticoagulation 04/26/2016  . Chronic venous insufficiency   . Preventative health care 10/05/2014  . Breast mass, left 10/05/2014  . Osteopenia 09/15/2014  . Hypertension 01/04/2014  . Chronic systolic dysfunction of left ventricle 05/05/2013  . At high risk for falls 02/11/2013  . Vitamin D deficiency 07/15/2012  . Sinoatrial node dysfunction (HCC) 05/08/2011  . Allergic rhinitis 05/31/2008  . Morbid obesity (HCC) 02/12/2007  . GASTROESOPHAGEAL REFLUX, NO ESOPHAGITIS 02/12/2007  . Female stress incontinence 02/12/2007  . Seizure disorder (HCC) 02/12/2007  . OSA (obstructive sleep apnea) 02/12/2007    Current Outpatient Prescriptions:  .  amiodarone (PACERONE) 200 MG tablet, Take 1 tablet (200 mg total) by mouth daily., Disp: 30 tablet, Rfl: 3 .  cholecalciferol (VITAMIN D) 1000 units tablet, Take 1 tablet (1,000 Units total) by mouth daily., Disp: 90 tablet, Rfl: 3 .  edoxaban (SAVAYSA) 60 MG TABS tablet, Take 60 mg by mouth daily., Disp: 30 tablet, Rfl: 11 .  fluticasone (FLONASE) 50 MCG/ACT nasal spray, Place 2 sprays into both nostrils daily as needed. For congestion. (Patient not taking: Reported on 08/20/2017), Disp: 16 g, Rfl: 1 .  metolazone  (ZAROXOLYN) 2.5 MG tablet, Take 1 tablet (2.5 mg total) by mouth as needed (weight gain or shortness of breath). Take as directed (Patient not taking: Reported on 07/31/2017), Disp: 15 tablet, Rfl: 1 .  omeprazole (PRILOSEC) 20 MG capsule, Take 1 capsule (20 mg total) by mouth daily., Disp: 90 capsule, Rfl: 1 .  OXYGEN, Inhale 2 L into the lungs at bedtime. cpap machine, Disp: , Rfl:  .  PHENobarbital (LUMINAL) 32.4 MG tablet, Take 2 tablets (64.8 mg total) by mouth 2 (two) times daily., Disp: 120 tablet, Rfl: 5 .  potassium chloride (K-DUR,KLOR-CON) 10 MEQ tablet, Take 4 tablets (40 mEq total) by mouth 2 (two) times daily., Disp: 240 tablet, Rfl: 3 .  torsemide (DEMADEX) 20 MG tablet, Take 1-2 tablets (20-40 mg total) by mouth as directed. Take 40 mg in the AM and 20 mg in the PM, Disp: 90 tablet, Rfl: 6 Allergies  Allergen Reactions  . Aspirin Hives and Rash  . Penicillins Rash and Other (See Comments)    Amoxicillin also, Has patient had a PCN reaction causing immediate rash, facial/tongue/throat swelling, SOB or lightheadedness with hypotension: Yes Has patient had a PCN reaction causing severe rash involving mucus membranes or skin necrosis: No Has patient had a PCN reaction that required hospitalization No Has patient had a PCN reaction occurring within the last 10 years: Yes If all of the above answers are "NO", then may proceed with Cephalosporin use.   . Tape Other (See Comments)    NO PAPER TAPE-MUST BE MEDICAL TAPE - unknown reaction  . Wool Alcohol [Lanolin] Hives  .  Amoxicillin Rash  . Ampicillin Itching and Rash     Social History   Social History  . Marital status: Married    Spouse name: Misty Gray  . Number of children: 4  . Years of education: 12   Occupational History  . disabled    Social History Main Topics  . Smoking status: Former Smoker    Packs/day: 1.00    Years: 7.00    Types: Cigarettes    Quit date: 03/16/1969  . Smokeless tobacco: Never Used  .  Alcohol use No  . Drug use: No  . Sexual activity: Not Currently    Birth control/ protection: Post-menopausal   Other Topics Concern  . Not on file   Social History Narrative   Lives with husband Misty Gray - Misty Gray lives with them as do his 2 children   Daughter, Misty Gray, and her 3 children live in [place] -  Misty Gray is incarcerated   Nephew, Misty Gray lives with them   Daughter, Misty Gray, in [name] [address]   Moved June 2013 to a better home on [facility].      Lives with husband, son, grandson, granddaughter, nephew.    Physical Exam      Future Appointments Date Time Provider Department Center  08/28/2017 3:30 PM Misty Gray, DPM [facility]  11/04/2017 1:40 PM [name], Misty Buckles, MD [facility] None   BP 100/64   Pulse 68   Resp 15   Wt 156 lb (70.8 kg)   SpO2 94%   BMI 30.47 kg/m  Weight yesterday-156 Last visit weight-158  Pt reports she is doing ok, she has pain to her foot, suppose to go to doc tomor about it but does not have the money. She also has a sore to her abd which she has seen a doc for and was referred to a surgeon but she doesn't have the money for that co-pay visit either. She is using neosporin and bandaids for that. She reports that lately she has been having issues lying flat at night time-she is not able to use her 02 at night, the son is looking for another place to rent. No swelling noted. Pt denies increased sob. Speaks in full sentence-gets sob upon increased exertion. Pt got her next 2 wks of meds delivered. It looked all correct. The pharmacy did not separate the afternoon dose of torsemide so I will have to call them again about that.   ACTION: Home visit completed  Misty Gray, EMT-Paramedic 08/27/17

## 2017-08-28 ENCOUNTER — Ambulatory Visit: Payer: Medicare HMO | Admitting: Podiatry

## 2017-09-02 ENCOUNTER — Emergency Department (HOSPITAL_COMMUNITY): Payer: Medicare HMO

## 2017-09-02 ENCOUNTER — Emergency Department (HOSPITAL_COMMUNITY)
Admission: EM | Admit: 2017-09-02 | Discharge: 2017-09-02 | Disposition: A | Discharge status: Home / Self Care | Payer: Medicare HMO | Attending: Emergency Medicine | Admitting: Emergency Medicine

## 2017-09-02 ENCOUNTER — Encounter (HOSPITAL_COMMUNITY): Payer: Self-pay | Admitting: *Deleted

## 2017-09-02 DIAGNOSIS — Y939 Activity, unspecified: Secondary | ICD-10-CM | POA: Insufficient documentation

## 2017-09-02 DIAGNOSIS — Z8673 Personal history of transient ischemic attack (TIA), and cerebral infarction without residual deficits: Secondary | ICD-10-CM | POA: Diagnosis not present

## 2017-09-02 DIAGNOSIS — I110 Hypertensive heart disease with heart failure: Secondary | ICD-10-CM | POA: Insufficient documentation

## 2017-09-02 DIAGNOSIS — I5022 Chronic systolic (congestive) heart failure: Secondary | ICD-10-CM | POA: Diagnosis not present

## 2017-09-02 DIAGNOSIS — Z880 Allergy status to penicillin: Secondary | ICD-10-CM | POA: Insufficient documentation

## 2017-09-02 DIAGNOSIS — Y658 Other specified misadventures during surgical and medical care: Secondary | ICD-10-CM | POA: Diagnosis not present

## 2017-09-02 DIAGNOSIS — Z950 Presence of cardiac pacemaker: Secondary | ICD-10-CM | POA: Diagnosis not present

## 2017-09-02 DIAGNOSIS — Y999 Unspecified external cause status: Secondary | ICD-10-CM | POA: Diagnosis not present

## 2017-09-02 DIAGNOSIS — Z87891 Personal history of nicotine dependence: Secondary | ICD-10-CM | POA: Diagnosis not present

## 2017-09-02 DIAGNOSIS — Y929 Unspecified place or not applicable: Secondary | ICD-10-CM | POA: Insufficient documentation

## 2017-09-02 DIAGNOSIS — Z79899 Other long term (current) drug therapy: Secondary | ICD-10-CM | POA: Insufficient documentation

## 2017-09-02 DIAGNOSIS — S31105A Unspecified open wound of abdominal wall, periumbilic region without penetration into peritoneal cavity, initial encounter: Secondary | ICD-10-CM | POA: Insufficient documentation

## 2017-09-02 DIAGNOSIS — I4892 Unspecified atrial flutter: Secondary | ICD-10-CM | POA: Diagnosis not present

## 2017-09-02 DIAGNOSIS — K8020 Calculus of gallbladder without cholecystitis without obstruction: Secondary | ICD-10-CM | POA: Diagnosis not present

## 2017-09-02 DIAGNOSIS — B372 Candidiasis of skin and nail: Secondary | ICD-10-CM | POA: Diagnosis not present

## 2017-09-02 DIAGNOSIS — T814XXA Infection following a procedure, initial encounter: Secondary | ICD-10-CM | POA: Diagnosis present

## 2017-09-02 LAB — BASIC METABOLIC PANEL
ANION GAP: 6 (ref 5–15)
BUN: 22 mg/dL — AB (ref 6–20)
CALCIUM: 9.4 mg/dL (ref 8.9–10.3)
CO2: 32 mmol/L (ref 22–32)
Chloride: 102 mmol/L (ref 101–111)
Creatinine, Ser: 0.81 mg/dL (ref 0.44–1.00)
GFR calc Af Amer: >60 mL/min (ref >60)
GLUCOSE: 90 mg/dL (ref 65–99)
Potassium: 3.5 mmol/L (ref 3.5–5.1)
Sodium: 140 mmol/L (ref 135–145)

## 2017-09-02 LAB — CBC WITH DIFFERENTIAL/PLATELET
Basophils Absolute: 0 10*3/uL (ref 0.0–0.1)
Basophils Relative: 1 %
EOS PCT: 1 %
Eosinophils Absolute: 0.1 10*3/uL (ref 0.0–0.7)
HCT: 42.3 % (ref 36.0–46.0)
Hemoglobin: 13.4 g/dL (ref 12.0–15.0)
LYMPHS PCT: 30 %
Lymphs Abs: 1.2 10*3/uL (ref 0.7–4.0)
MCH: 31.4 pg (ref 26.0–34.0)
MCHC: 31.7 g/dL (ref 30.0–36.0)
MCV: 99.1 fL (ref 78.0–100.0)
MONO ABS: 0.3 10*3/uL (ref 0.1–1.0)
Monocytes Relative: 8 %
Neutro Abs: 2.4 10*3/uL (ref 1.7–7.7)
Neutrophils Relative %: 60 %
PLATELETS: 93 10*3/uL — AB (ref 150–400)
RBC: 4.27 MIL/uL (ref 3.87–5.11)
RDW: 13.9 % (ref 11.5–15.5)
WBC: 4 10*3/uL (ref 4.0–10.5)

## 2017-09-02 MED ORDER — ACETAMINOPHEN 325 MG PO TABS
650.0000 mg | ORAL_TABLET | Freq: Once | ORAL | Status: AC
  Administered 2017-09-02: 650 mg via ORAL
  Filled 2017-09-02: qty 2

## 2017-09-02 MED ORDER — NYSTATIN 100000 UNIT/GM EX CREA: TOPICAL_CREAM | CUTANEOUS | 0 refills | Status: DC

## 2017-09-02 MED ORDER — NYSTATIN 100000 UNIT/GM EX CREA
TOPICAL_CREAM | Freq: Two times a day (BID) | CUTANEOUS | Status: DC
  Administered 2017-09-02: 21:00:00 via TOPICAL
  Filled 2017-09-02: qty 15

## 2017-09-02 MED ORDER — SODIUM CHLORIDE 0.9 % IV BOLUS (SEPSIS)
500.0000 mL | Freq: Once | INTRAVENOUS | Status: AC
  Administered 2017-09-02: 500 mL via INTRAVENOUS

## 2017-09-02 MED ORDER — IOPAMIDOL (ISOVUE-300) INJECTION 61%
INTRAVENOUS | Status: AC
  Administered 2017-09-02: 100 mL
  Filled 2017-09-02: qty 100

## 2017-09-02 NOTE — ED Notes (Signed)
Patient transported to CT 

## 2017-09-02 NOTE — ED Provider Notes (Signed)
[facility] Provider Note   CSN: [id number] Arrival date & time: 09/02/17  1226     History   Chief Complaint Chief Complaint  Patient presents with  . Wound Infection    HPI Misty Gray is a 66 y.o. female.  HPI   Patient is a 66 year old female with a history of ventral hernia repair in 1989, HF, and atrial fibrillation (on anticoagulation) resenting for 2-3 days of periumbilical erythema and drainage along her prior incision site. Patient reports that she picked a scab and there was "wire" beneath it. Patient reports that there was some bleeding and clear drainage, but no purulence or spreading erythema. Other than local dressings, patient has not tried any treatments for this infection. Patient reports that she has had problems with her hernia repair since she lost approximately 100 pounds over the last couple years. Patient reports that there has been evidence on imaging of shifting of the mesh. Patient denies fever, chills, abdominal pain, nausea, vomiting, diarrhea, or constipation. Patient has not yet been evaluated by surgery.  On chart review, patient had a CT scan of the abdomen and pelvis on 08-21-2017 that showed linear collection of fluid in the lower abdomen differential diagnosis of postop seroma or lymphocele versus panniculitis.  Past Medical History:  Diagnosis Date  . Abnormal CT of the head 12/16/1984   R parietal atrophy  . Allergic rhinitis, cause unspecified   . Altered mental status 11/28/2016  . Anemia   . Arthritis    "hands and knees" (09/06/2015)  . Atrial fibrillation (HCC)   . Cellulitis 09/07/2015  . Cellulitis and abscess of leg 09/2016   bilateral  . CHF (congestive heart failure) (HCC)   . Diaphragmatic hernia without mention of obstruction or gangrene   . Dyspnea   . Exertional dyspnea 06/22/12  . Family history of adverse reaction to anesthesia    "daughter fights w/them; just can't relax during OR; stay awake during the OR"  . Fatigue  06/22/2012  . Female stress incontinence   . GERD (gastroesophageal reflux disease)   . Grand mal seizure (HCC)   . H/O hiatal hernia    two removed  . Heart murmur   . High cholesterol   . History of blood transfusion 1956   "related to nose bleed"  . Hypertension   . Influenza A 01/11/2014  . Lichenification and lichen simplex chronicus   . Migraine    "when I was a teenager"  . Morbid obesity (HCC)   . Nonischemic cardiomyopathy (HCC)    EF has normalized-repeat Pending   . On home oxygen therapy    "2L when I'm asleep in bed" (09/06/2015)  . OSA on CPAP   . Pacemaker  st judes    Patient states "this is my third pacemaker"  . Pneumonia 2004; 2011; 11/2011  . Seizures (HCC) since 1981   "can hear you talking but sounds like you are in big tunnel; have them often if not taking RX; last one was 06/17/12" (09/06/2015)  . Shortness of breath 10/24/2010   Qualifier: Diagnosis of  By: [place], Bonnie    . Sick sinus syndrome (HCC)    WITH PRIOR DDD PACEMAKER IMPLANTATION  . Somnolence   . Stroke [facility]) 1988   "mouth drawed real bad on my left side; found out it was a seizure"  . Syncope and collapse 06/22/12   "hit forehead and left knee"; denies loss of consciousness  . Unspecified venous (peripheral) insufficiency  Patient Active Problem List   Diagnosis Date Noted  . Ventral hernia without obstruction or gangrene 08/15/2017  . Skin irritation 07/23/2017  . Chronic venous stasis dermatitis 12/26/2016  . Urinary tract infection without hematuria   . Frequent PVCs   . Hypokalemia   . Housing problems 11/23/2016  . Chronic atrial fibrillation (HCC) 11/01/2016  . Hypotension 10/31/2016  . Special screening for malignant neoplasms, colon 08/12/2016  . Heme positive stool 08/09/2016  . Intertrigo 08/09/2016  . Chronic anticoagulation 04/26/2016  . Chronic venous insufficiency   . Preventative health care 10/05/2014  . Breast mass, left 10/05/2014  . Osteopenia 09/15/2014  .  Hypertension 01/04/2014  . Chronic systolic dysfunction of left ventricle 05/05/2013  . At high risk for falls 02/11/2013  . Vitamin D deficiency 07/15/2012  . Sinoatrial node dysfunction (HCC) 05/08/2011  . Allergic rhinitis 05/31/2008  . Morbid obesity (HCC) 02/12/2007  . GASTROESOPHAGEAL REFLUX, NO ESOPHAGITIS 02/12/2007  . Female stress incontinence 02/12/2007  . Seizure disorder (HCC) 02/12/2007  . OSA (obstructive sleep apnea) 02/12/2007    Past Surgical History:  Procedure Laterality Date  . APPENDECTOMY    . CARDIAC CATHETERIZATION N/A 10/03/2016   Procedure: Right/Left Heart Cath and Coronary Angiography;  Surgeon: Dolores Patty, MD;  Location: [facility] INVASIVE CV LAB;  Service: Cardiovascular;  Laterality: N/A;  . HERNIA REPAIR  ? 1988; 04/15/1998  . INSERT / REPLACE / REMOVE PACEMAKER  12/16/1990   DDD EF 30%;  Marland Kitchen INSERT / REPLACE / REMOVE PACEMAKER  07/25/2003   replaced by BB, leads are both IS1 and from 1992  . INSERT / REPLACE / REMOVE PACEMAKER  05/21/2011   JA; generator change  . LEG SKIN LESION  BIOPSY / EXCISION  05/16/2001   Lichen Planus  . VENTRAL HERNIA REPAIR  1989; 04/15/1998    OB History    No data available       Home Medications    Prior to Admission medications   Medication Sig Start Date End Date Taking? Authorizing Provider  amiodarone (PACERONE) 200 MG tablet Take 1 tablet (200 mg total) by mouth daily. 06/13/17  Yes [name], [name], MD  cholecalciferol (VITAMIN D) 1000 units tablet Take 1 tablet (1,000 Units total) by mouth daily. 04/09/17  Yes Uvaldo Rising, MD  edoxaban (SAVAYSA) 60 MG TABS tablet Take 60 mg by mouth daily. 06/13/17  Yes [name], [name], MD  fluticasone (FLONASE) 50 MCG/ACT nasal spray Place 2 sprays into both nostrils daily as needed. For congestion. Patient taking differently: Place 2 sprays into both nostrils daily as needed (for congestion).  07/28/17  Yes Latrelle Dodrill, MD  metolazone (ZAROXOLYN) 2.5 MG tablet Take  1 tablet (2.5 mg total) by mouth as needed (weight gain or shortness of breath). Take as directed 07/28/17  Yes [name], [name], MD  omeprazole (PRILOSEC) 20 MG capsule Take 1 capsule (20 mg total) by mouth daily. 07/28/17  Yes Latrelle Dodrill, MD  PHENobarbital (LUMINAL) 32.4 MG tablet Take 2 tablets (64.8 mg total) by mouth 2 (two) times daily. 05/20/17  Yes Uvaldo Rising, MD  potassium chloride (K-DUR,KLOR-CON) 10 MEQ tablet Take 4 tablets (40 mEq total) by mouth 2 (two) times daily. 06/13/17  Yes [name], [name], MD  torsemide (DEMADEX) 20 MG tablet Take 1-2 tablets (20-40 mg total) by mouth as directed. Take 40 mg in the AM and 20 mg in the PM Patient taking differently: Take 20-40 mg by mouth See admin instructions. 40  mg in the morning 20 mg in the late afternoon 06/13/17  Yes [name], [name], MD    Family History Family History  Problem Relation Age of Onset  . Stroke Father        died from CAD?  Marland Kitchen Heart disease Father   . Cancer Mother   . Diabetes Son        Type 1  . Sleep apnea Son   . Cancer Sister        cervical  . Hypertension Sister   . Hypertension Brother   . Rheum arthritis Daughter        Also PGM, PGGM    Social History Social History  Substance Use Topics  . Smoking status: Former Smoker    Packs/day: 1.00    Years: 7.00    Types: Cigarettes    Quit date: 03/16/1969  . Smokeless tobacco: Never Used  . Alcohol use No     Allergies   Aspirin; Penicillins; Tape; Wool alcohol [lanolin]; Amoxicillin; and Ampicillin   Review of Systems Review of Systems  Constitutional: Negative for chills and fever.  HENT: Negative for congestion, rhinorrhea and sore throat.   Eyes: Negative for visual disturbance.  Respiratory: Negative for cough, chest tightness and shortness of breath.   Cardiovascular: Negative for chest pain and leg swelling.  Gastrointestinal: Negative for abdominal pain, constipation, diarrhea, nausea and vomiting.  Genitourinary:  Negative for dysuria.  Musculoskeletal: Positive for back pain. Negative for myalgias.  Skin: Positive for rash and wound.  Neurological: Negative for dizziness, light-headedness and headaches.     Physical Exam Updated Vital Signs BP 124/73 (BP Location: Right Arm)   Pulse (!) 46   Temp 97.6 F (36.4 C) (Oral)   Resp 16   SpO2 97%   Physical Exam  Constitutional: She appears well-developed and well-nourished. No distress.  HENT:  Head: Normocephalic and atraumatic.  Mouth/Throat: Oropharynx is clear and moist.  Eyes: Pupils are equal, round, and reactive to light. Conjunctivae and EOM are normal.  Neck: Normal range of motion. Neck supple.  Cardiovascular: Normal rate, S1 normal and S2 normal.   No murmur heard. Bradycardia noted.  Pulmonary/Chest: Effort normal and breath sounds normal. She has no wheezes. She has no rales.  Abdominal: Soft. She exhibits no distension. There is no tenderness. There is no guarding.  Musculoskeletal: Normal range of motion. She exhibits no edema or deformity.  Neurological: She is alert.  Cranial nerves grossly intact. Good coordination and movement of extremities.  Skin: Skin is warm and dry. No rash noted. No erythema.  1cm scab on central abdomen overlying ventral incision line with surrounding erythema with scattered patches in in a satellite pattern. No induration or fluctuance overlying wound.  Intertrigo present in b/l inguinal folds.  Psychiatric: She has a normal mood and affect. Her behavior is normal. Judgment and thought content normal.  Nursing note and vitals reviewed.    ED Treatments / Results  Labs (all labs ordered are listed, but only abnormal results are displayed) Labs Reviewed  CBC WITH DIFFERENTIAL/PLATELET  BASIC METABOLIC PANEL    EKG  EKG Interpretation None       Radiology Ct Abdomen Pelvis W Contrast  Result Date: 09/02/2017 CLINICAL DATA:  History of abdominal surgery with re- opening of her wound  EXAM: CT ABDOMEN AND PELVIS WITH CONTRAST TECHNIQUE: Multidetector CT imaging of the abdomen and pelvis was performed using the standard protocol following bolus administration of intravenous contrast. CONTRAST:  ISOVUE-300  IOPAMIDOL (ISOVUE-300) INJECTION 61% COMPARISON:  08/21/2017 FINDINGS: Lower chest: Stable small left pleural effusion and passive atelectasis in the left lung base. Cardiomegaly with partially visualized cardiac pacing leads Hepatobiliary: No focal hepatic abnormality. Calcified gallstones. No biliary dilatation Pancreas: Unremarkable. No pancreatic ductal dilatation or surrounding inflammatory changes. Spleen: Heterogeneous with questionable vague hypodensity in the mid spleen. Adrenals/Urinary Tract: Adrenal glands are within normal limits. Kidneys show no hydronephrosis. The bladder is normal Stomach/Bowel: The stomach is nonenlarged. No dilated small bowel. No colon wall thickening. Appendix is not well seen, presumably corresponding to history of appendectomy. Vascular/Lymphatic: Nonaneurysmal aorta. No significantly enlarged lymph nodes Reproductive: Prominent uterine fundus with suspected, likely fibroid. No adnexal mass. Other: Negative for free air or free fluid. Evidence of prior lower ventral hernia repair. Re- demonstrated lenticular fluid collection at the hernia repair site measuring 11 mm x 8.1 cm with thick rim enhancement. Decreased surrounding inflammatory changes. Left lower quadrant ventral hernia containing mesenteric fat and a small amount of colon, along the left lateral margin of hernia repair. No obstruction. Musculoskeletal: Degenerative changes. No acute or suspicious bone lesion IMPRESSION: 1. Negative for a bowel obstruction. Re- demonstrated left lower quadrant ventral hernia containing mesenteric fat and small amount of colon, at the left lateral edge of the hernia repair. 2. Stable to slight decreased size of a lenticular thick rimmed fluid collection at the  hernia repair site. Decreased inflammation in the surrounding fat. 3. Small left pleural effusion, no change 4. Calcified gallstones 5. Probable uterine fibroids Electronically Signed   By: Jasmine Pang M.D.   On: 09/02/2017 19:26    Procedures Procedures (including critical care time)  Medications Ordered in ED Medications  acetaminophen (TYLENOL) tablet 650 mg (not administered)  nystatin cream (MYCOSTATIN) (not administered)  sodium chloride 0.9 % bolus 500 mL (500 mLs Intravenous New Bag/Given 09/02/17 1849)  iopamidol (ISOVUE-300) 61 % injection (100 mLs  Contrast Given 09/02/17 1900)     Initial Impression / Assessment and Plan / ED Course  I have reviewed the triage vital signs and the nursing notes.  Pertinent labs & imaging results that were available during my care of the patient were reviewed by me and considered in my medical decision making (see chart for details).     Final Clinical Impressions(s) / ED Diagnoses   Final diagnoses:  None    MDM  Patient is a 66 year old female with a history of ventral hernia repair in 1989, HF, and atrial fibrillation (on anticoagulation) resenting for 2-3 days of periumbilical erythema and drainage along her prior incision site. No concerning signs of induration or fluctuance on exam today. Lesions most consistent with cutaneous candidiasis. CT scan of the abdomen and pelvis demonstrated an improvement in prior panniculitis noted on CT scan from 08-21-2017. Patient given Nystatin prescription for Candida infection and intertrigo. Patient has not been evaluated by general surgery for these complaints. Patient was given follow up with Dr. Janee Morn in general surgery, as well as instruction for close follow-up with her PCP to examine areas of infection. Patient and family are in agreement with plan of care.  Patient noted to be in [place] in department today. Patient reports this is chronic, and she is on A/C.  EKG performed due to  bradycardic readings. Patient asymptomatic at this time.   This is a shared visit with Dr. Marily Memos. Patient was independently evaluated by this attending physician. Attending physician consulted in evaluation and discharge management.  New Prescriptions New Prescriptions  No medications on file     Delia Chimes 09/02/17 2245    Mesner, Barbara Cower, MD 09/03/17 [phone]

## 2017-09-02 NOTE — Discharge Instructions (Signed)
Please see the information and instructions below regarding your visit.  Your diagnoses today include:  1. Candidal dermatitis    This is a fungal infection that can form in warm, moist areas.   Tests performed today include: -CT scan of the abdomen/pelvis. This shows less inflammation under the incision site than your CT scan on 08/21/2017. See side panel of your discharge paperwork for testing performed today. Vital signs are listed at the bottom of these instructions.   Medications prescribed:    Please apply the antifungal twice daily for 3 weeks.  Take any prescribed medications only as prescribed, and any over the counter medications only as directed on the packaging.  Home care instructions:  Please use a mild soap to clean the area of infection and make sure to pat it dry completely. Please keep it covered with gauze. Please use drying fabric in areas where there are folds.  Please follow any educational materials contained in this packet.   Follow-up instructions: Please follow-up with your primary care provider in one week for further evaluation of your symptoms if they are not completely improved.   Please follow up with Dr. Janee Morn in general surgery for reevaluation of your surgical site.   Return instructions:  Please return to the Emergency Department if you experience worsening symptoms.  Please return the emergency Department if you see any yellow or green drainage, the area of redness increases, or you develop fever or chills. Please return if you have any other emergent concerns.  Additional Information:  Your vital signs today were: BP 124/73 (BP Location: Right Arm)    Pulse (!) 46    Temp 97.6 F (36.4 C) (Oral)    Resp 16    SpO2 97%  If your blood pressure (BP) was elevated on multiple readings during this visit above 130 for the top number or above 80 for the bottom number, please have this repeated by your primary care provider within one  month. --------------  Thank you for allowing [place] to participate in your care today.

## 2017-09-02 NOTE — ED Triage Notes (Signed)
PT had operation to abdomen in 1989 and now it has opened and is bleeding which started last Wednesday.  Pt has bandaid to site with redness.

## 2017-09-03 ENCOUNTER — Other Ambulatory Visit (HOSPITAL_COMMUNITY): Payer: Self-pay

## 2017-09-03 NOTE — Progress Notes (Signed)
Paramedicine Encounter    Patient ID: Misty Gray, female    DOB: 1951/02/05, 66 y.o.   MRN: [id number]   Patient Care Team: Latrelle Dodrill, MD as PCP - General (Family Medicine) [name], MD (Cardiology)  Patient Active Problem List   Diagnosis Date Noted  . Ventral hernia without obstruction or gangrene 08/15/2017  . Skin irritation 07/23/2017  . Chronic venous stasis dermatitis 12/26/2016  . Urinary tract infection without hematuria   . Frequent PVCs   . Hypokalemia   . Housing problems 11/23/2016  . Chronic atrial fibrillation (HCC) 11/01/2016  . Hypotension 10/31/2016  . Special screening for malignant neoplasms, colon 08/12/2016  . Heme positive stool 08/09/2016  . Intertrigo 08/09/2016  . Chronic anticoagulation 04/26/2016  . Chronic venous insufficiency   . Preventative health care 10/05/2014  . Breast mass, left 10/05/2014  . Osteopenia 09/15/2014  . Hypertension 01/04/2014  . Chronic systolic dysfunction of left ventricle 05/05/2013  . At high risk for falls 02/11/2013  . Vitamin D deficiency 07/15/2012  . Sinoatrial node dysfunction (HCC) 05/08/2011  . Allergic rhinitis 05/31/2008  . Morbid obesity (HCC) 02/12/2007  . GASTROESOPHAGEAL REFLUX, NO ESOPHAGITIS 02/12/2007  . Female stress incontinence 02/12/2007  . Seizure disorder (HCC) 02/12/2007  . OSA (obstructive sleep apnea) 02/12/2007    Current Outpatient Prescriptions:  .  amiodarone (PACERONE) 200 MG tablet, Take 1 tablet (200 mg total) by mouth daily., Disp: 30 tablet, Rfl: 3 .  cholecalciferol (VITAMIN D) 1000 units tablet, Take 1 tablet (1,000 Units total) by mouth daily., Disp: 90 tablet, Rfl: 3 .  edoxaban (SAVAYSA) 60 MG TABS tablet, Take 60 mg by mouth daily., Disp: 30 tablet, Rfl: 11 .  fluticasone (FLONASE) 50 MCG/ACT nasal spray, Place 2 sprays into both nostrils daily as needed. For congestion. (Patient taking differently: Place 2 sprays into both nostrils daily as needed (for  congestion). ), Disp: 16 g, Rfl: 1 .  nystatin cream (MYCOSTATIN), Apply to affected area 2 times daily for 3 weeks., Disp: 30 g, Rfl: 0 .  omeprazole (PRILOSEC) 20 MG capsule, Take 1 capsule (20 mg total) by mouth daily., Disp: 90 capsule, Rfl: 1 .  PHENobarbital (LUMINAL) 32.4 MG tablet, Take 2 tablets (64.8 mg total) by mouth 2 (two) times daily., Disp: 120 tablet, Rfl: 5 .  potassium chloride (K-DUR,KLOR-CON) 10 MEQ tablet, Take 4 tablets (40 mEq total) by mouth 2 (two) times daily., Disp: 240 tablet, Rfl: 3 .  torsemide (DEMADEX) 20 MG tablet, Take 1-2 tablets (20-40 mg total) by mouth as directed. Take 40 mg in the AM and 20 mg in the PM (Patient taking differently: Take 20-40 mg by mouth See admin instructions. 40 mg in the morning 20 mg in the late afternoon), Disp: 90 tablet, Rfl: 6 .  metolazone (ZAROXOLYN) 2.5 MG tablet, Take 1 tablet (2.5 mg total) by mouth as needed (weight gain or shortness of breath). Take as directed (Patient not taking: Reported on 09/03/2017), Disp: 15 tablet, Rfl: 1 Allergies  Allergen Reactions  . Aspirin Hives and Rash  . Penicillins Rash and Other (See Comments)    Has patient had a PCN reaction causing immediate rash, facial/tongue/throat swelling, SOB or lightheadedness with hypotension: Yes Has patient had a PCN reaction causing severe rash involving mucus membranes or skin necrosis: No Has patient had a PCN reaction that required hospitalization No Has patient had a PCN reaction occurring within the last 10 years: Yes If all of the above answers are "NO", then  may proceed with Cephalosporin use.   . Tape Other (See Comments)    NO PAPER TAPE-MUST BE MEDICAL TAPE - unknown reaction  . Wool Alcohol [Lanolin] Hives  . Amoxicillin Rash  . Ampicillin Itching and Rash     Social History   Social History  . Marital status: Married    Spouse name: [name]  . Number of children: 4  . Years of education: 12   Occupational History  . disabled     Social History Main Topics  . Smoking status: Former Smoker    Packs/day: 1.00    Years: 7.00    Types: Cigarettes    Quit date: 03/16/1969  . Smokeless tobacco: Never Used  . Alcohol use No  . Drug use: No  . Sexual activity: Not Currently    Birth control/ protection: Post-menopausal   Other Topics Concern  . Not on file   Social History Narrative   Lives with husband [name] lives with them as do his 2 children   Daughter, [name], and her 3 children live in [place] -  [name] is incarcerated   Nephew, Shauntavia Eckland lives with them   Daughter, [name], in [place] [name] [address]   Moved June 2013 to a better home on [facility].      Lives with husband, son, grandson, granddaughter, nephew.    Physical Exam      Future Appointments Date Time Provider Department Center  11/04/2017 1:40 PM [name], [name], MD [facility] None   BP 112/80   Pulse 66   Resp 15   Wt 155 lb (70.3 kg)   SpO2 98%   BMI 30.27 kg/m  Weight yesterday-?? In ER  Last visit weight-156  Pt reports she was recently in ER yesterday for her abd issues and the scab came off with a lot of bleeding-ER advised her she would likely have to have surgery to repair the hernia. She is going to call her insurance to see how much her copay is for the speciality visit and if its a lot then she wont have the money for the copay but I advised her to let me know when she finds out and see if there is some assistance that we can help with. She denies any increased sob, no dizziness, no h/a. Only issues is with her scab on her old incision on her abd area.    ACTION: Home visit completed  Kerry Hough, EMT-Paramedic 09/03/17

## 2017-09-10 ENCOUNTER — Other Ambulatory Visit (HOSPITAL_COMMUNITY): Payer: Self-pay | Admitting: *Deleted

## 2017-09-10 ENCOUNTER — Telehealth (HOSPITAL_COMMUNITY): Payer: Self-pay | Admitting: *Deleted

## 2017-09-10 ENCOUNTER — Other Ambulatory Visit (HOSPITAL_COMMUNITY): Payer: Self-pay

## 2017-09-10 MED ORDER — POTASSIUM CHLORIDE CRYS ER 10 MEQ PO TBCR: 40.0000 meq | EXTENDED_RELEASE_TABLET | Freq: Three times a day (TID) | ORAL | 3 refills | Status: DC

## 2017-09-10 MED ORDER — TORSEMIDE 20 MG PO TABS: 40.0000 mg | ORAL_TABLET | Freq: Two times a day (BID) | ORAL | 6 refills | Status: DC

## 2017-09-10 NOTE — Telephone Encounter (Signed)
Advanced Heart Failure Triage Encounter  [name]   Date of Call: 09/10/2017  Problem:  Katie with paramedicine called stating patients weight was up 7lbs in 1 week. Patient admitted to eating canned soups and salty food. No edema noted or shortness of breath denies fatigue and chest pain.    Plan:  Per Amy increase Torsemide to 40 mg BID, increase Potassium to 40 meq TID, and schedule office visit for next week. Patient scheduled for 10/3 at 10:30am. [name] will notify patient of changes and appt. Also patient receives bubble packs of her medication I called [place] pharmacy and ordered a weeks worth of medications with dose change.   Modesta Messing, CMA

## 2017-09-10 NOTE — Progress Notes (Signed)
Paramedicine Encounter    Patient ID: Misty Gray, female    DOB: Oct 23, 1951, 66 y.o.   MRN: [id number]   Patient Care Team: Latrelle Dodrill, MD as PCP - General (Family Medicine) [name], MD (Cardiology)  Patient Active Problem List   Diagnosis Date Noted  . Ventral hernia without obstruction or gangrene 08/15/2017  . Skin irritation 07/23/2017  . Chronic venous stasis dermatitis 12/26/2016  . Urinary tract infection without hematuria   . Frequent PVCs   . Hypokalemia   . Housing problems 11/23/2016  . Chronic atrial fibrillation (HCC) 11/01/2016  . Hypotension 10/31/2016  . Special screening for malignant neoplasms, colon 08/12/2016  . Heme positive stool 08/09/2016  . Intertrigo 08/09/2016  . Chronic anticoagulation 04/26/2016  . Chronic venous insufficiency   . Preventative health care 10/05/2014  . Breast mass, left 10/05/2014  . Osteopenia 09/15/2014  . Hypertension 01/04/2014  . Chronic systolic dysfunction of left ventricle 05/05/2013  . At high risk for falls 02/11/2013  . Vitamin D deficiency 07/15/2012  . Sinoatrial node dysfunction (HCC) 05/08/2011  . Allergic rhinitis 05/31/2008  . Morbid obesity (HCC) 02/12/2007  . GASTROESOPHAGEAL REFLUX, NO ESOPHAGITIS 02/12/2007  . Female stress incontinence 02/12/2007  . Seizure disorder (HCC) 02/12/2007  . OSA (obstructive sleep apnea) 02/12/2007    Current Outpatient Prescriptions:  .  amiodarone (PACERONE) 200 MG tablet, Take 1 tablet (200 mg total) by mouth daily., Disp: 30 tablet, Rfl: 3 .  cholecalciferol (VITAMIN D) 1000 units tablet, Take 1 tablet (1,000 Units total) by mouth daily., Disp: 90 tablet, Rfl: 3 .  edoxaban (SAVAYSA) 60 MG TABS tablet, Take 60 mg by mouth daily., Disp: 30 tablet, Rfl: 11 .  fluticasone (FLONASE) 50 MCG/ACT nasal spray, Place 2 sprays into both nostrils daily as needed. For congestion. (Patient taking differently: Place 2 sprays into both nostrils daily as needed (for  congestion). ), Disp: 16 g, Rfl: 1 .  nystatin cream (MYCOSTATIN), Apply to affected area 2 times daily for 3 weeks., Disp: 30 g, Rfl: 0 .  omeprazole (PRILOSEC) 20 MG capsule, Take 1 capsule (20 mg total) by mouth daily., Disp: 90 capsule, Rfl: 1 .  PHENobarbital (LUMINAL) 32.4 MG tablet, Take 2 tablets (64.8 mg total) by mouth 2 (two) times daily., Disp: 120 tablet, Rfl: 5 .  metolazone (ZAROXOLYN) 2.5 MG tablet, Take 1 tablet (2.5 mg total) by mouth as needed (weight gain or shortness of breath). Take as directed (Patient not taking: Reported on 09/03/2017), Disp: 15 tablet, Rfl: 1 .  potassium chloride (K-DUR,KLOR-CON) 10 MEQ tablet, Take 4 tablets (40 mEq total) by mouth 3 (three) times daily., Disp: 240 tablet, Rfl: 3 .  torsemide (DEMADEX) 20 MG tablet, Take 2 tablets (40 mg total) by mouth 2 (two) times daily., Disp: 90 tablet, Rfl: 6 Allergies  Allergen Reactions  . Aspirin Hives and Rash  . Penicillins Rash and Other (See Comments)    Has patient had a PCN reaction causing immediate rash, facial/tongue/throat swelling, SOB or lightheadedness with hypotension: Yes Has patient had a PCN reaction causing severe rash involving mucus membranes or skin necrosis: No Has patient had a PCN reaction that required hospitalization No Has patient had a PCN reaction occurring within the last 10 years: Yes If all of the above answers are "NO", then may proceed with Cephalosporin use.   . Tape Other (See Comments)    NO PAPER TAPE-MUST BE MEDICAL TAPE - unknown reaction  . Wool Alcohol [Lanolin] Hives  .  Amoxicillin Rash  . Ampicillin Itching and Rash     Social History   Social History  . Marital status: Married    Spouse name: [name]  . Number of children: 4  . Years of education: 12   Occupational History  . disabled    Social History Main Topics  . Smoking status: Former Smoker    Packs/day: 1.00    Years: 7.00    Types: Cigarettes    Quit date: 03/16/1969  . Smokeless tobacco:  Never Used  . Alcohol use No  . Drug use: No  . Sexual activity: Not Currently    Birth control/ protection: Post-menopausal   Other Topics Concern  . Not on file   Social History Narrative   Lives with husband [name] lives with them as do his 2 children   Daughter, [name], and her 3 children live in [place] -  [name] is incarcerated   Nephew, Orlinda Slomski lives with them   Daughter, [name], in [name] [address]   Moved June 2013 to a better home on [facility].      Lives with husband, son, grandson, granddaughter, nephew.    Physical Exam      Future Appointments Date Time Provider Department Center  09/15/2017 2:30 PM Marquette Saa, MD [facility]  09/17/2017 10:30 AM [facility] PA/NP [facility] None  10/21/2017 8:00 PM [facility] 3 [facility]  11/04/2017 1:40 PM [name], [name], MD [facility] None   BP 110/68   Pulse 64   Resp 15   Wt 162 lb (73.5 kg)   SpO2 98%   BMI 31.64 kg/m  Weight yesterday-159 Last visit weight-155  Pt reports she is feeling ok , she has some tension to her abd where its pulling from her nernia. She took her last dose of meds this morning and I called the pharmacy and they will get it ready to be delivered to today.  Her weight is up 7lbs from last week, she reports she is limiting her fluid intake. She reports she has been eating sandwiches, eggs without salt, her abd has always been distended due to her hernia issues-she is suppose to be going to a surgeon on 10/6 and she states she will have the copay at that time. She cant tell the difference between weight gain vs fluid gain-no increased sob, unable to lay flat but that is nothing new. She had a few torsemide tablets left over before we moved her over to the bubble packing pharmacy but she has taken those already.  She took an extra dose of her torsemide last Wednesday due to feet swelling. States she ate soup  yesterday-I advised her that had a ton of sodium in it and she should not be eating that. And also her family has been cooking and its highly likely that they did not use low sodium techniques for her. Contacted the clinic about her weight gain and jazmin will consult with provider and call me back for further instructions.  jazmin at clinic advised that for the next week pt is to 40/40 of torsemide with additional of potassium-she will call the pharmacy to get that changed in pill pack and to only send out one week of meds until her appointment next week. Called the pharmacy to verify that the message was received and it was. Will f/u at her next clinic visit next week.  ACTION: Home visit completed  Kerry Hough, EMT-Paramedic 09/10/17

## 2017-09-11 ENCOUNTER — Other Ambulatory Visit (HOSPITAL_COMMUNITY): Payer: Self-pay | Admitting: Cardiology

## 2017-09-11 ENCOUNTER — Other Ambulatory Visit (HOSPITAL_COMMUNITY): Payer: Self-pay

## 2017-09-11 MED ORDER — AMIODARONE HCL 200 MG PO TABS: 200.0000 mg | ORAL_TABLET | Freq: Every day | ORAL | 3 refills | Status: DC

## 2017-09-11 NOTE — Progress Notes (Signed)
[facility] pharmacy was suppose to deliver her meds yesterday--they did NOT bring her the meds-- when I called them today they reported the pharmacy was waiting on savaysa to be brought to pharmacy before her meds were ready to be delivered which it would be at 3 when it would arrive to pharmacy. When they arrived to the pt the pt called me and said the bubble packing wasn't right, they sent the savaysa separate, it wasn't in bubble packing and they left out her vit d, prilosec, and phenobarbitol. This is more than once she has gotten her bubble packs with errors in it so we will [facility]. I removed the pills from bubble packing and placed it back in the old pill box we used before.   09/11/17 Kerry Hough, EMT-Paramedic

## 2017-09-15 ENCOUNTER — Encounter: Payer: Self-pay | Admitting: Internal Medicine

## 2017-09-15 ENCOUNTER — Ambulatory Visit (INDEPENDENT_AMBULATORY_CARE_PROVIDER_SITE_OTHER): Payer: Medicare HMO | Admitting: Internal Medicine

## 2017-09-15 DIAGNOSIS — I279 Pulmonary heart disease, unspecified: Secondary | ICD-10-CM | POA: Diagnosis not present

## 2017-09-15 DIAGNOSIS — R0902 Hypoxemia: Secondary | ICD-10-CM | POA: Diagnosis not present

## 2017-09-15 DIAGNOSIS — Q871 Congenital malformation syndromes predominantly associated with short stature: Secondary | ICD-10-CM | POA: Diagnosis not present

## 2017-09-15 DIAGNOSIS — R21 Rash and other nonspecific skin eruption: Secondary | ICD-10-CM | POA: Insufficient documentation

## 2017-09-15 DIAGNOSIS — G4733 Obstructive sleep apnea (adult) (pediatric): Secondary | ICD-10-CM | POA: Diagnosis not present

## 2017-09-15 MED ORDER — TRIAMCINOLONE ACETONIDE 0.5 % EX OINT: 1.0000 "application " | TOPICAL_OINTMENT | Freq: Three times a day (TID) | CUTANEOUS | 0 refills | Status: DC

## 2017-09-15 MED ORDER — NYSTATIN 100000 UNIT/GM EX CREA: TOPICAL_CREAM | CUTANEOUS | 1 refills | Status: DC

## 2017-09-15 NOTE — Assessment & Plan Note (Signed)
Abdominal rash still consistent in appearance with candidiasis. Would continue Nystatin as prescribed. Refilled for patient today.   Rash on back unclear in etiology. Not consistent with zoster, as crosses midline and also no vesicular lesions, though could be hard to distinguish given extensive excoriations. Likely atopic dermatitis, though irritant unclear. Will treat with Kenalog TID. Encouraged patient to try to avoid scratching as much as possible.

## 2017-09-15 NOTE — Patient Instructions (Signed)
It was nice seeing you again today Ms. Finigan!  Please begin applying the Kenalog steroid cream to your back three times a day. Continue putting the Nystatin cream on your stomach as instructed.   If your rashes are not getting better in a couple weeks, please schedule another appointment.   If you have any questions or concerns, please feel free to call the clinic.   Be well,  Dr. Natale Milch

## 2017-09-15 NOTE — Progress Notes (Signed)
   Subjective:   Patient: Misty Gray       Birthdate: [birth year]       MRN: [id number]      HPI  Misty Gray is a 66 y.o. female presenting for rash.   Patient seen in ED for rash on abdomen on 09/19. Was told at that time that rash was likely yeast candidiasis, and was prescribed Nystatin. Patient has been applying Nystatin daily as prescribed. Abdominal rash has spread a little bit, but patient has now developed new rash on her back that she is more concerned about. Rash on back appeared about 9 days ago. Says initially was small red bumps. Is very itchy, to the point where patient has had to cut her fingernails very short so she will not continue to scratch the area. She has not put anything on her back to help with the rash. Says it feels similar to when she had shingles before. Denies burning. Denies new detergents, moisturizers, lotions, medications (other than Nystatin cream).   Smoking status reviewed. Patient is former smoker (quit 1970).   Review of Systems See HPI.     Objective:  Physical Exam  Constitutional: She is oriented to person, place, and time and well-developed, well-nourished, and in no distress.  HENT:  Head: Normocephalic and atraumatic.  Eyes: Conjunctivae and EOM are normal. Right eye exhibits no discharge. Left eye exhibits no discharge.  Pulmonary/Chest: Effort normal. No respiratory distress.  Abdominal: Soft. Bowel sounds are normal. She exhibits no distension. There is no tenderness.  Large ventral hernia present  Neurological: She is alert and oriented to person, place, and time.  Skin:  Back: Erythematous rash in lumbar region crossing midline. Extension excoriations. No discernable vesicles or pustules, though difficult to determine given scratching.   Abdomen: Erythema in pannus consistent with yeast candidiasis. Some satellite lesions noted on abdomen. Covered with dry clean gauze.   Skin otherwise warm and dry.   Psychiatric: Affect and  judgment normal.   Assessment & Plan:  Rash and nonspecific skin eruption Abdominal rash still consistent in appearance with candidiasis. Would continue Nystatin as prescribed. Refilled for patient today.   Rash on back unclear in etiology. Not consistent with zoster, as crosses midline and also no vesicular lesions, though could be hard to distinguish given extensive excoriations. Likely atopic dermatitis, though irritant unclear. Will treat with Kenalog TID. Encouraged patient to try to avoid scratching as much as possible.  Precepted with Dr. Leveda Anna who also evaluated patient.   Tarri Abernethy, MD, MPH PGY-3 [facility] Pager [phone]

## 2017-09-17 ENCOUNTER — Ambulatory Visit (HOSPITAL_COMMUNITY)
Admission: RE | Admit: 2017-09-17 | Discharge: 2017-09-17 | Disposition: A | Discharge status: Home / Self Care | Payer: Medicare HMO | Source: Ambulatory Visit | Attending: Internal Medicine | Admitting: Internal Medicine

## 2017-09-17 ENCOUNTER — Encounter (HOSPITAL_COMMUNITY): Payer: Self-pay

## 2017-09-17 ENCOUNTER — Telehealth (HOSPITAL_COMMUNITY): Payer: Self-pay

## 2017-09-17 VITALS — BP 116/80 | HR 74 | Wt 158.2 lb

## 2017-09-17 DIAGNOSIS — I429 Cardiomyopathy, unspecified: Secondary | ICD-10-CM | POA: Insufficient documentation

## 2017-09-17 DIAGNOSIS — I5022 Chronic systolic (congestive) heart failure: Secondary | ICD-10-CM | POA: Diagnosis not present

## 2017-09-17 DIAGNOSIS — I495 Sick sinus syndrome: Secondary | ICD-10-CM | POA: Diagnosis not present

## 2017-09-17 DIAGNOSIS — Z87891 Personal history of nicotine dependence: Secondary | ICD-10-CM | POA: Insufficient documentation

## 2017-09-17 DIAGNOSIS — I110 Hypertensive heart disease with heart failure: Secondary | ICD-10-CM | POA: Insufficient documentation

## 2017-09-17 DIAGNOSIS — Z7901 Long term (current) use of anticoagulants: Secondary | ICD-10-CM

## 2017-09-17 DIAGNOSIS — Z8673 Personal history of transient ischemic attack (TIA), and cerebral infarction without residual deficits: Secondary | ICD-10-CM | POA: Diagnosis not present

## 2017-09-17 DIAGNOSIS — G40409 Other generalized epilepsy and epileptic syndromes, not intractable, without status epilepticus: Secondary | ICD-10-CM | POA: Diagnosis not present

## 2017-09-17 DIAGNOSIS — R011 Cardiac murmur, unspecified: Secondary | ICD-10-CM | POA: Insufficient documentation

## 2017-09-17 DIAGNOSIS — I493 Ventricular premature depolarization: Secondary | ICD-10-CM | POA: Diagnosis not present

## 2017-09-17 DIAGNOSIS — K219 Gastro-esophageal reflux disease without esophagitis: Secondary | ICD-10-CM | POA: Insufficient documentation

## 2017-09-17 DIAGNOSIS — Z950 Presence of cardiac pacemaker: Secondary | ICD-10-CM | POA: Diagnosis not present

## 2017-09-17 DIAGNOSIS — I482 Chronic atrial fibrillation, unspecified: Secondary | ICD-10-CM

## 2017-09-17 DIAGNOSIS — M1990 Unspecified osteoarthritis, unspecified site: Secondary | ICD-10-CM | POA: Insufficient documentation

## 2017-09-17 DIAGNOSIS — G4733 Obstructive sleep apnea (adult) (pediatric): Secondary | ICD-10-CM | POA: Diagnosis not present

## 2017-09-17 DIAGNOSIS — Z9981 Dependence on supplemental oxygen: Secondary | ICD-10-CM | POA: Diagnosis not present

## 2017-09-17 DIAGNOSIS — I519 Heart disease, unspecified: Secondary | ICD-10-CM

## 2017-09-17 DIAGNOSIS — E7800 Pure hypercholesterolemia, unspecified: Secondary | ICD-10-CM | POA: Insufficient documentation

## 2017-09-17 DIAGNOSIS — R0602 Shortness of breath: Secondary | ICD-10-CM | POA: Insufficient documentation

## 2017-09-17 DIAGNOSIS — I10 Essential (primary) hypertension: Secondary | ICD-10-CM

## 2017-09-17 DIAGNOSIS — K449 Diaphragmatic hernia without obstruction or gangrene: Secondary | ICD-10-CM | POA: Insufficient documentation

## 2017-09-17 DIAGNOSIS — I872 Venous insufficiency (chronic) (peripheral): Secondary | ICD-10-CM | POA: Diagnosis not present

## 2017-09-17 MED ORDER — TORSEMIDE 20 MG PO TABS: 40.0000 mg | ORAL_TABLET | Freq: Two times a day (BID) | ORAL | 3 refills | Status: DC

## 2017-09-17 MED ORDER — POTASSIUM CHLORIDE CRYS ER 10 MEQ PO TBCR: 40.0000 meq | EXTENDED_RELEASE_TABLET | Freq: Three times a day (TID) | ORAL | 3 refills | Status: DC

## 2017-09-17 NOTE — Progress Notes (Addendum)
ADVANCED HF CLINIC NOTE Primary HF: Dr. Gala Romney   HPI: [name] is a 66 y/o woman with history of morbid obesity, OSA, chronic atrial fibrillation with tachy-brady syndrome s/p PPM, frequent PVCs, systolic HF due to NICM.  Admitted for ADHF in 10/17. Cath with normal coronary arteries and elevated filling pressures. Echo 10/17 EF 30-35% (previously 35-40%). 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 (She has 81% RV pacing and 15-20 PVCs per minute). Started on po amio with suppression of PVCs. Diuresed 41 pounds. Discharge weight 197.   Admitted 12/14 through 12/06/2016 with marked volume overload. Diuresed with IV lasix + metolazone. Discharge weight was 195 pounds.Transitioned to torsemide 40 mg in am and 20 mg in pm.   Pt presents today for regular follow up. Weight at home 158-162 and stable after recent torsemide increase. She states her breathing is at baseline. Mild SOB climbing stairs, walking the grocery store, or "getting in a hurry". Chronic 2 pillow orthopnea. No SOB with ADLs around the house. Not wearing CPAP at night. States her home power grid cant tolerate it, and blows a fuse whenever she plugs it in. Not watching salt and fluid intake very closely. Paramedicine following.   Echo 3/18 reviewed personally. EF 15-20% moderate MR/TR  RV modrately HK   R/LHC 10/03/16 Ao = 94/58 (72) LV = 104/20 RA = 21 RV = 65/4/19 PA = 65/21 (38) PCW = 23 Fick cardiac output/index = 6.1/2.9 PVR = 2.5 WU FA sat = 97%  PA sat = 69%, 70%  Assessment: 1. Normal coronary arteries 2. NICM with EF 30-35% by echo 3. Persistently elevated biventricular pressures    Past Medical History:  Diagnosis Date  . Abnormal CT of the head 12/16/1984   R parietal atrophy  . Allergic rhinitis, cause unspecified   . Altered mental status 11/28/2016  . Anemia   . Arthritis    "hands and knees" (09/06/2015)  . Atrial fibrillation (HCC)   . Cellulitis 09/07/2015  . Cellulitis and abscess of leg  09/2016   bilateral  . CHF (congestive heart failure) (HCC)   . Diaphragmatic hernia without mention of obstruction or gangrene   . Dyspnea   . Exertional dyspnea 06/22/12  . Family history of adverse reaction to anesthesia    "daughter fights w/them; just can't relax during OR; stay awake during the OR"  . Fatigue 06/22/2012  . Female stress incontinence   . GERD (gastroesophageal reflux disease)   . Grand mal seizure (HCC)   . H/O hiatal hernia    two removed  . Heart murmur   . High cholesterol   . History of blood transfusion 1956   "related to nose bleed"  . Hypertension   . Influenza A 01/11/2014  . Lichenification and lichen simplex chronicus   . Migraine    "when I was a teenager"  . Morbid obesity (HCC)   . Nonischemic cardiomyopathy (HCC)    EF has normalized-repeat Pending   . On home oxygen therapy    "2L when I'm asleep in bed" (09/06/2015)  . OSA on CPAP   . Pacemaker  st judes    Patient states "this is my third pacemaker"  . Pneumonia 2004; 2011; 11/2011  . Seizures (HCC) since 1981   "can hear you talking but sounds like you are in big tunnel; have them often if not taking RX; last one was 06/17/12" (09/06/2015)  . Shortness of breath 10/24/2010   Qualifier: Diagnosis of  By: [place], Bonnie    .  Sick sinus syndrome (HCC)    WITH PRIOR DDD PACEMAKER IMPLANTATION  . Somnolence   . Stroke [facility]) 1988   "mouth drawed real bad on my left side; found out it was a seizure"  . Syncope and collapse 06/22/12   "hit forehead and left knee"; denies loss of consciousness  . Unspecified venous (peripheral) insufficiency     Current Outpatient Prescriptions  Medication Sig Dispense Refill  . amiodarone (PACERONE) 200 MG tablet Take 1 tablet (200 mg total) by mouth daily. 30 tablet 3  . cholecalciferol (VITAMIN D) 1000 units tablet Take 1 tablet (1,000 Units total) by mouth daily. 90 tablet 3  . edoxaban (SAVAYSA) 60 MG TABS tablet Take 60 mg by mouth daily. 30 tablet 11  .  fluticasone (FLONASE) 50 MCG/ACT nasal spray Place 2 sprays into both nostrils daily as needed. For congestion. 16 g 1  . nystatin cream (MYCOSTATIN) Apply to affected area 2 times daily for 3 weeks. 30 g 1  . omeprazole (PRILOSEC) 20 MG capsule Take 1 capsule (20 mg total) by mouth daily. 90 capsule 1  . PHENobarbital (LUMINAL) 32.4 MG tablet Take 2 tablets (64.8 mg total) by mouth 2 (two) times daily. 120 tablet 5  . potassium chloride (K-DUR,KLOR-CON) 10 MEQ tablet Take 4 tablets (40 mEq total) by mouth 3 (three) times daily. 360 tablet 3  . torsemide (DEMADEX) 20 MG tablet Take 2 tablets (40 mg total) by mouth 2 (two) times daily. 120 tablet 3  . triamcinolone ointment (KENALOG) 0.5 % Apply 1 application topically 3 (three) times daily. 60 g 0  . metolazone (ZAROXOLYN) 2.5 MG tablet Take 1 tablet (2.5 mg total) by mouth as needed (weight gain or shortness of breath). Take as directed (Patient not taking: Reported on 09/03/2017) 15 tablet 1   No current facility-administered medications for this encounter.     Allergies  Allergen Reactions  . Aspirin Hives and Rash  . Penicillins Rash and Other (See Comments)    Has patient had a PCN reaction causing immediate rash, facial/tongue/throat swelling, SOB or lightheadedness with hypotension: Yes Has patient had a PCN reaction causing severe rash involving mucus membranes or skin necrosis: No Has patient had a PCN reaction that required hospitalization No Has patient had a PCN reaction occurring within the last 10 years: Yes If all of the above answers are "NO", then may proceed with Cephalosporin use.   . Tape Other (See Comments)    NO PAPER TAPE-MUST BE MEDICAL TAPE - unknown reaction  . Wool Alcohol [Lanolin] Hives  . Amoxicillin Rash  . Ampicillin Itching and Rash      Social History   Social History  . Marital status: Married    Spouse name: [name]  . Number of children: 4  . Years of education: 12   Occupational History  .  disabled    Social History Main Topics  . Smoking status: Former Smoker    Packs/day: 1.00    Years: 7.00    Types: Cigarettes    Quit date: 03/16/1969  . Smokeless tobacco: Never Used  . Alcohol use No  . Drug use: No  . Sexual activity: Not Currently    Birth control/ protection: Post-menopausal   Other Topics Concern  . Not on file   Social History Narrative   Lives with husband [name] lives with them as do his 2 children   Daughter, [name], and her 3 children live in [place]  Son -  Randell Patient is incarcerated   Nephew, Jesi Jurgens lives with them   Daughter, [name], in [name] [address]   Moved June 2013 to a better home on [facility].      Lives with husband, son, grandson, granddaughter, nephew.    Family History  Problem Relation Age of Onset  . Stroke Father        died from CAD?  Marland Kitchen Heart disease Father   . Cancer Mother   . Diabetes Son        Type 1  . Sleep apnea Son   . Cancer Sister        cervical  . Hypertension Sister   . Hypertension Brother   . Rheum arthritis Daughter        Also PGM, PGGM   Vitals:   09/17/17 0957  BP: 116/80  Pulse: 74  SpO2: 95%  Weight: 158 lb 3.2 oz (71.8 kg)   Wt Readings from Last 3 Encounters:  09/17/17 158 lb 3.2 oz (71.8 kg)  09/15/17 160 lb (72.6 kg)  09/10/17 162 lb (73.5 kg)   PHYSICAL EXAM:  General: Elderly female, NAD.  HEENT: Normal Neck: Supple. JVP does not appear elevated. Carotids 2+ bilat; no bruits. No thyromegaly or nodule noted. Cor: PMI nondisplaced. Irregular. 1/6 SEM.  Lungs: CTAB Abdomen: Soft, non-tender, non-distended, no HSM. No bruits or masses. +BS  Extremities: No cyanosis, clubbing, rash, R and LLE no edema. Legs with multiple scabs. No drainage.  Neuro: Alert & orientedx3, cranial nerves grossly intact. moves all 4 extremities w/o difficulty. Affect pleasant   ASSESSMENT & PLAN: 1. Chronic systolic HF due to NICM. ? PVC  induced vs RV pacing. Echo 12/06/2016 EF 10-15%. Cath 10/17 normal coronary arteries.  Echo 02/2017 with LVEF 15-20% despite PVC suppression. Echo 07/2017 EF 15-20%.  - NYHA class III symptoms chronically - Volume status stable on exam.  - Continue toresemide  BID.  - Unable to tolerate beta blocker.  - Has failed losartan 12.5 mg hs due to hypotension.  - Has been seen by EP. Not candidate for ICD or CRT - Reinforced dietary and fluid restrictions.    2. Frequent PVCs - Continue Amio 200 mg daily.  No change.  3. Obesity  - Encouraged her to increase activity. No change.  4. Chronic AF with tach brady s/p pacemaker - Continue edoxaban. Denies melena and hematochezia.  - Device interrogation recently showed 15% V pacing. Previously around 41%. No change.   Recent labs stable. (09/02/17). She has failed multiple attempts at medication titration, so will continue current meds for now. RTC 2 months with MD visit.   Graciella Freer, PA-C  10:52 AM

## 2017-09-17 NOTE — Progress Notes (Addendum)
Advanced Heart Failure Medication Review by a Pharmacist  Does the patient  feel that his/her medications are working for him/her?  yes  Has the patient been experiencing any side effects to the medications prescribed?  no  Does the patient measure his/her own blood pressure or blood glucose at home?  no   Does the patient have any problems obtaining medications due to transportation or finances?   no  Understanding of regimen: fair Understanding of indications: fair Potential of compliance: fair Patient understands to avoid NSAIDs. Patient understands to avoid decongestants.  Issues to address at subsequent visits: None   Pharmacist comments: Misty Gray is a pleasant 66 yo F presenting with her husband and some of her medication bottles/pillbox. [name]) sees her weekly to fill her pillbox. She reports good compliance with her regimen but states that she is taking KCl 40 mEq BID instead of TID and torsemide 40/20 daily. [name] did verify that actually since last week, her pillpack was filled with torsemide 40 mg BID and KCl 40 meq TID per [name]'s recommendation for weight gain. No other medication-related questions or concerns for me at this time.   [name]. Bonnye Fava, PharmD, BCPS, CPP Clinical Pharmacist Pager: [phone] Phone: [phone] 09/17/2017 10:09 AM      Time with patient: 14 minutes Preparation and documentation time: 2 minutes Total time: 16 minutes

## 2017-09-17 NOTE — Patient Instructions (Signed)
No changes to medication at this time.  No lab work today.  Follow up 2 months with Dr. Shirlee Latch.  _______________________________________________________________________ [place] Code: 8002  Take all medication as prescribed the day of your appointment. Bring all medications with you to your appointment.  Do the following things EVERYDAY: 1) Weigh yourself in the morning before breakfast. Write it down and keep it in a log. 2) Take your medicines as prescribed 3) Eat low salt foods-Limit salt (sodium) to 2000 mg per day.  4) Stay as active as you can everyday 5) Limit all fluids for the day to less than 2 liters

## 2017-09-17 NOTE — Telephone Encounter (Signed)
CHF Clinic appointment reminder call placed to patient for upcoming appointment.  Does understand purpose of this appointment and where CHF Clinic is located? Yes  How is patient feeling? Well, no complaints  Does patient have all of their medications? Yes  Patient also reminded to take all medications as prescribed on the day of his/her appointment and to bring all medications to this appointment.  Advised to call our office for tardiness or cancellations/rescheduling needs.  .Alysa Duca Genevea  

## 2017-09-19 DIAGNOSIS — K469 Unspecified abdominal hernia without obstruction or gangrene: Secondary | ICD-10-CM | POA: Diagnosis not present

## 2017-09-30 ENCOUNTER — Other Ambulatory Visit (HOSPITAL_COMMUNITY): Payer: Self-pay

## 2017-09-30 NOTE — Progress Notes (Signed)
Paramedicine Encounter    Patient ID: Misty Gray, female    DOB: 08-Jun-1951, 65 y.o.   MRN: [id number]   Patient Care Team: Latrelle Dodrill, MD as PCP - General (Family Medicine) [name], MD (Cardiology)  Patient Active Problem List   Diagnosis Date Noted  . Rash and nonspecific skin eruption 09/15/2017  . Ventral hernia without obstruction or gangrene 08/15/2017  . Skin irritation 07/23/2017  . Chronic venous stasis dermatitis 12/26/2016  . Urinary tract infection without hematuria   . Frequent PVCs   . Hypokalemia   . Housing problems 11/23/2016  . Chronic atrial fibrillation (HCC) 11/01/2016  . Hypotension 10/31/2016  . Special screening for malignant neoplasms, colon 08/12/2016  . Heme positive stool 08/09/2016  . Intertrigo 08/09/2016  . Chronic anticoagulation 04/26/2016  . Chronic venous insufficiency   . Preventative health care 10/05/2014  . Breast mass, left 10/05/2014  . Osteopenia 09/15/2014  . Hypertension 01/04/2014  . Chronic systolic dysfunction of left ventricle 05/05/2013  . At high risk for falls 02/11/2013  . Vitamin D deficiency 07/15/2012  . Sinoatrial node dysfunction (HCC) 05/08/2011  . Allergic rhinitis 05/31/2008  . Morbid obesity (HCC) 02/12/2007  . GASTROESOPHAGEAL REFLUX, NO ESOPHAGITIS 02/12/2007  . Female stress incontinence 02/12/2007  . Seizure disorder (HCC) 02/12/2007  . OSA (obstructive sleep apnea) 02/12/2007    Current Outpatient Prescriptions:  .  amiodarone (PACERONE) 200 MG tablet, Take 1 tablet (200 mg total) by mouth daily., Disp: 30 tablet, Rfl: 3 .  cholecalciferol (VITAMIN D) 1000 units tablet, Take 1 tablet (1,000 Units total) by mouth daily., Disp: 90 tablet, Rfl: 3 .  edoxaban (SAVAYSA) 60 MG TABS tablet, Take 60 mg by mouth daily., Disp: 30 tablet, Rfl: 11 .  fluticasone (FLONASE) 50 MCG/ACT nasal spray, Place 2 sprays into both nostrils daily as needed. For congestion., Disp: 16 g, Rfl: 1 .  nystatin cream  (MYCOSTATIN), Apply to affected area 2 times daily for 3 weeks., Disp: 30 g, Rfl: 1 .  omeprazole (PRILOSEC) 20 MG capsule, Take 1 capsule (20 mg total) by mouth daily., Disp: 90 capsule, Rfl: 1 .  PHENobarbital (LUMINAL) 32.4 MG tablet, Take 2 tablets (64.8 mg total) by mouth 2 (two) times daily., Disp: 120 tablet, Rfl: 5 .  potassium chloride (K-DUR,KLOR-CON) 10 MEQ tablet, Take 4 tablets (40 mEq total) by mouth 3 (three) times daily., Disp: 360 tablet, Rfl: 3 .  torsemide (DEMADEX) 20 MG tablet, Take 2 tablets (40 mg total) by mouth 2 (two) times daily., Disp: 120 tablet, Rfl: 3 .  triamcinolone ointment (KENALOG) 0.5 %, Apply 1 application topically 3 (three) times daily., Disp: 60 g, Rfl: 0 .  metolazone (ZAROXOLYN) 2.5 MG tablet, Take 1 tablet (2.5 mg total) by mouth as needed (weight gain or shortness of breath). Take as directed (Patient not taking: Reported on 09/03/2017), Disp: 15 tablet, Rfl: 1 Allergies  Allergen Reactions  . Aspirin Hives and Rash  . Penicillins Rash and Other (See Comments)    Has patient had a PCN reaction causing immediate rash, facial/tongue/throat swelling, SOB or lightheadedness with hypotension: Yes Has patient had a PCN reaction causing severe rash involving mucus membranes or skin necrosis: No Has patient had a PCN reaction that required hospitalization No Has patient had a PCN reaction occurring within the last 10 years: Yes If all of the above answers are "NO", then may proceed with Cephalosporin use.   . Tape Other (See Comments)    NO PAPER TAPE-MUST BE  MEDICAL TAPE - unknown reaction  . Wool Alcohol [Lanolin] Hives  . Amoxicillin Rash  . Ampicillin Itching and Rash     Social History   Social History  . Marital status: Married    Spouse name: [name]  . Number of children: 4  . Years of education: 12   Occupational History  . disabled    Social History Main Topics  . Smoking status: Former Smoker    Packs/day: 1.00    Years: 7.00     Types: Cigarettes    Quit date: 03/16/1969  . Smokeless tobacco: Never Used  . Alcohol use No  . Drug use: No  . Sexual activity: Not Currently    Birth control/ protection: Post-menopausal   Other Topics Concern  . Not on file   Social History Narrative   Lives with husband [name] lives with them as do his 2 children   Daughter, [name], and her 3 children live in [place] -  [name] is incarcerated   Nephew, Fusako Espejo lives with them   Daughter, [name], in [name] [address]   Moved June 2013 to a better home on [facility].      Lives with husband, son, grandson, granddaughter, nephew.    Physical Exam      Future Appointments Date Time Provider Department Center  10/21/2017 8:00 PM [facility] 3 [facility]  11/04/2017 1:40 PM [name], [name], MD [facility] None   BP 108/74   Pulse 68   Resp 15   Wt 162 lb (73.5 kg)   SpO2 98%   BMI 31.64 kg/m  Weight yesterday-163 Last visit weight-162  Pt reports she is feeling good, she is still having issues with her hernia-she is being followed by doc but they referred to a place in [place] some clinic that can do surgery on heart patients-?? She has poor diet-sometimes doesn't eat but then when she does eat its of higher sodium content.  Pharmacy brought only 2 wks out instead of 4- I called pharmacy and they advised that she was already given 2wks so this is the other 2 wks to make the month but next delivery it will be for 4 wks.  Pt denies increased sob, weight being between 257-262. She did miss a dose of her fluid pills yesterday due to the pharmacy bringing it out late.  Will see her in 2 wks to verify meds when they are brought out again.   ACTION: Home visit completed  Kerry Hough, EMT-Paramedic 09/30/17

## 2017-10-06 ENCOUNTER — Telehealth: Payer: Self-pay | Admitting: Family Medicine

## 2017-10-06 NOTE — Telephone Encounter (Signed)
Pt also called and left a message on nurse line.  It sounded as if she had found a ride but wanted a different date, but im not sure.   LM for pt to call back with clarification on what is needed.  Fleeger, Maryjo Rochester, CMA

## 2017-10-06 NOTE — Telephone Encounter (Signed)
Pt is calling because we referred her to [facility] for her hernia. The problem is that she can not get a ride to get down there. Can we find something a little closer. Please call and discuss with patient. jw

## 2017-10-06 NOTE — Telephone Encounter (Signed)
Spoke with patient, she is unable to make appt in [place] due to distance.  She would like to go to [facility] instead.  Clarified that [facility] was the ones who referred her to [facility] so she would need to contact them and ask for a place in [place]. Fleeger, Maryjo Rochester, CMA

## 2017-10-16 ENCOUNTER — Other Ambulatory Visit: Payer: Self-pay | Admitting: Family Medicine

## 2017-10-16 DIAGNOSIS — I279 Pulmonary heart disease, unspecified: Secondary | ICD-10-CM | POA: Diagnosis not present

## 2017-10-16 DIAGNOSIS — G4733 Obstructive sleep apnea (adult) (pediatric): Secondary | ICD-10-CM | POA: Diagnosis not present

## 2017-10-16 DIAGNOSIS — Q871 Congenital malformation syndromes predominantly associated with short stature: Secondary | ICD-10-CM | POA: Diagnosis not present

## 2017-10-16 DIAGNOSIS — R0902 Hypoxemia: Secondary | ICD-10-CM | POA: Diagnosis not present

## 2017-10-17 ENCOUNTER — Other Ambulatory Visit: Payer: Self-pay | Admitting: Family Medicine

## 2017-10-17 MED ORDER — PHENOBARBITAL 32.4 MG PO TABS
64.8000 mg | ORAL_TABLET | Freq: Two times a day (BID) | ORAL | 5 refills | Status: DC
Start: 2017-10-17 — End: 2018-03-05

## 2017-10-20 ENCOUNTER — Other Ambulatory Visit (HOSPITAL_COMMUNITY): Payer: Self-pay

## 2017-10-20 NOTE — Progress Notes (Signed)
Paramedicine Encounter    Patient ID: Dutch Grayolly J Grossberg, female    DOB: [birth year], 66 y.o.   MRN: [id number]   Patient Care Team: Latrelle DodrillMcIntyre, Brittany J, MD as PCP - General (Family Medicine) Hillis RangeAllred, James, MD (Cardiology)  Patient Active Problem List   Diagnosis Date Noted  . Rash and nonspecific skin eruption 09/15/2017  . Ventral hernia without obstruction or gangrene 08/15/2017  . Skin irritation 07/23/2017  . Chronic venous stasis dermatitis 12/26/2016  . Urinary tract infection without hematuria   . Frequent PVCs   . Hypokalemia   . Housing problems 11/23/2016  . Chronic atrial fibrillation (HCC) 11/01/2016  . Hypotension 10/31/2016  . Special screening for malignant neoplasms, colon 08/12/2016  . Heme positive stool 08/09/2016  . Intertrigo 08/09/2016  . Chronic anticoagulation 04/26/2016  . Chronic venous insufficiency   . Preventative health care 10/05/2014  . Breast mass, left 10/05/2014  . Osteopenia 09/15/2014  . Hypertension 01/04/2014  . Chronic systolic dysfunction of left ventricle 05/05/2013  . At high risk for falls 02/11/2013  . Vitamin D deficiency 07/15/2012  . Sinoatrial node dysfunction (HCC) 05/08/2011  . Allergic rhinitis 05/31/2008  . Morbid obesity (HCC) 02/12/2007  . GASTROESOPHAGEAL REFLUX, NO ESOPHAGITIS 02/12/2007  . Female stress incontinence 02/12/2007  . Seizure disorder (HCC) 02/12/2007  . OSA (obstructive sleep apnea) 02/12/2007    Current Outpatient Medications:  .  amiodarone (PACERONE) 200 MG tablet, Take 1 tablet (200 mg total) by mouth daily., Disp: 30 tablet, Rfl: 3 .  cholecalciferol (VITAMIN D) 1000 units tablet, Take 1 tablet (1,000 Units total) by mouth daily., Disp: 90 tablet, Rfl: 3 .  edoxaban (SAVAYSA) 60 MG TABS tablet, Take 60 mg by mouth daily., Disp: 30 tablet, Rfl: 11 .  fluticasone (FLONASE) 50 MCG/ACT nasal spray, Place 2 sprays into both nostrils daily as needed. For congestion., Disp: 16 g, Rfl: 1 .  nystatin cream  (MYCOSTATIN), Apply to affected area 2 times daily for 3 weeks., Disp: 30 g, Rfl: 1 .  omeprazole (PRILOSEC) 20 MG capsule, Take 1 capsule (20 mg total) by mouth daily., Disp: 90 capsule, Rfl: 1 .  PHENobarbital (LUMINAL) 32.4 MG tablet, Take 2 tablets (64.8 mg total) by mouth 2 (two) times daily., Disp: 120 tablet, Rfl: 5 .  potassium chloride (K-DUR,KLOR-CON) 10 MEQ tablet, Take 4 tablets (40 mEq total) by mouth 3 (three) times daily., Disp: 360 tablet, Rfl: 3 .  torsemide (DEMADEX) 20 MG tablet, Take 2 tablets (40 mg total) by mouth 2 (two) times daily., Disp: 120 tablet, Rfl: 3 .  triamcinolone ointment (KENALOG) 0.5 %, Apply 1 application topically 3 (three) times daily., Disp: 60 g, Rfl: 0 .  metolazone (ZAROXOLYN) 2.5 MG tablet, Take 1 tablet (2.5 mg total) by mouth as needed (weight gain or shortness of breath). Take as directed (Patient not taking: Reported on 09/03/2017), Disp: 15 tablet, Rfl: 1 Allergies  Allergen Reactions  . Aspirin Hives and Rash  . Penicillins Rash and Other (See Comments)    Has patient had a PCN reaction causing immediate rash, facial/tongue/throat swelling, SOB or lightheadedness with hypotension: Yes Has patient had a PCN reaction causing severe rash involving mucus membranes or skin necrosis: No Has patient had a PCN reaction that required hospitalization No Has patient had a PCN reaction occurring within the last 10 years: Yes If all of the above answers are "NO", then may proceed with Cephalosporin use.   . Tape Other (See Comments)    NO PAPER TAPE-MUST BE  MEDICAL TAPE - unknown reaction  . Wool Alcohol [Lanolin] Hives  . Amoxicillin Rash  . Ampicillin Itching and Rash     Social History   Socioeconomic History  . Marital status: Married    Spouse name: [name]  . Number of children: 4  . Years of education: 48  . Highest education level: Not on file  Social Needs  . Financial resource strain: Not on file  . Food insecurity - worry: Not on  file  . Food insecurity - inability: Not on file  . Transportation needs - medical: Not on file  . Transportation needs - non-medical: Not on file  Occupational History  . Occupation: disabled  Tobacco Use  . Smoking status: Former Smoker    Packs/day: 1.00    Years: 7.00    Pack years: 7.00    Types: Cigarettes    Last attempt to quit: 03/16/1969    Years since quitting: 48.6  . Smokeless tobacco: Never Used  Substance and Sexual Activity  . Alcohol use: No  . Drug use: No  . Sexual activity: Not Currently    Birth control/protection: Post-menopausal  Other Topics Concern  . Not on file  Social History Narrative   Lives with husband [name] lives with them as do his 2 children   Daughter, [name], and her 3 children live in [place] -  [name] is incarcerated   Nephew, Tyeisha Pautsch lives with them   Daughter, [name], in [name] [address]   Moved June 2013 to a better home on [facility].      Lives with husband, son, grandson, granddaughter, nephew.    Physical Exam      Future Appointments  Date Time Provider Department Center  10/21/2017  8:00 PM [facility] ROOM 3 [facility]  11/04/2017  1:40 PM [name], [name], MD [facility] None   BP 112/60   Pulse 66   Resp 15   Wt 162 lb (73.5 kg)   SpO2 98%   BMI 31.64 kg/m  Weight yesterday-161 Last visit weight-162  Pt reports she is doing ok, her weight fluctuates up and down, pt takes all her meds, [facility] once a month, when I checked her meds today the seal wasn't sealed completely and the pills came out-when I went to pick up her meds last week they all werent sealed and they all poured out and I had to leave the meds there and let them re-bubble it.  Pharmacy reports they got a new machine and it was acting up.  Pt reports she is feeling tired like she normally does, nothing unusual. No sob, no dizziness, urination good. She is  f/u for her abd issues.  Will see her in 3wks to verify meds again. No swelling noted, she needs to be cautious about her diet and is trying to cut back on her fluid intake.   ACTION: Home visit completed  Kerry Hough, EMT-Paramedic 10/20/17

## 2017-10-21 ENCOUNTER — Ambulatory Visit (HOSPITAL_BASED_OUTPATIENT_CLINIC_OR_DEPARTMENT_OTHER): Payer: Medicare HMO | Attending: Family Medicine | Admitting: Internal Medicine

## 2017-10-21 DIAGNOSIS — G4733 Obstructive sleep apnea (adult) (pediatric): Secondary | ICD-10-CM | POA: Insufficient documentation

## 2017-10-26 DIAGNOSIS — G4733 Obstructive sleep apnea (adult) (pediatric): Secondary | ICD-10-CM | POA: Diagnosis not present

## 2017-10-28 NOTE — Procedures (Signed)
[name], Misty Gray Date: 10/21/2017 Gender: Female D.O.B: 28-Oct-1951 Age (years): 26 Referring Provider: [name] Height (inches): 41 Interpreting Physician: Baird Lyons MD, ABSM Weight (lbs): 161 RPSGT: Laren Everts BMI: 29 MRN: [id number] Neck Size: 15.50 CLINICAL INFORMATION Sleep Study Type: Split Night CPAP  Indication for sleep study: Fatigue, Hypertension, Obesity, OSA, Snoring, Witnessed Apneas  Epworth Sleepiness Score: 7  SLEEP STUDY TECHNIQUE As per the AASM Manual for the Scoring of Sleep and Associated Events v2.3 (April 2016) with a hypopnea requiring 4% desaturations.  The channels recorded and monitored were frontal, central and occipital EEG, electrooculogram (EOG), submentalis EMG (chin), nasal and oral airflow, thoracic and abdominal wall motion, anterior tibialis EMG, snore microphone, electrocardiogram, and pulse oximetry. Continuous positive airway pressure (CPAP) was initiated when the patient met split night criteria and was titrated according to treat sleep-disordered breathing.  MEDICATIONS Medications self-administered by patient taken the night of the study : PHENOBARBITAL, POTASSIUM CHLORIDE  RESPIRATORY PARAMETERS Diagnostic  Total AHI (/hr): 18.6 RDI (/hr): 21.0 OA Index (/hr): 11.5 CA Index (/hr): 0.0 REM AHI (/hr): 4.3 NREM AHI (/hr): 20.4 Supine AHI (/hr): 18.6 Non-supine AHI (/hr): N/A Min O2 Sat (%): 81.00 Mean O2 (%): 94.60 Time below 88% (min): 1.2   Titration  Optimal Pressure (cm): 8 AHI at Optimal Pressure (/hr): 2.3 Min O2 at Optimal Pressure (%): 83.0 Supine % at Optimal (%): 100 Sleep % at Optimal (%): 97    SLEEP ARCHITECTURE The recording time for the entire night was 398.0 minutes.  During a baseline period of 149.4 minutes, the patient slept for 125.7 minutes in REM and nonREM, yielding a sleep efficiency of 84.1%. Sleep onset after lights out was 15.7 minutes with a REM latency of 64.5 minutes. The  patient spent 4.77% of the night in stage N1 sleep, 84.08% in stage N2 sleep, 0.00% in stage N3 and 11.14% in REM.  During the titration period of 235.5 minutes, the patient slept for 211.5 minutes in REM and nonREM, yielding a sleep efficiency of 89.8%. Sleep onset after CPAP initiation was 12.2 minutes with a REM latency of 37.0 minutes. The patient spent 8.51% of the night in stage N1 sleep, 71.63% in stage N2 sleep, 0.00% in stage N3 and 19.86% in REM.  CARDIAC DATA The 2 lead EKG demonstrated atrial fibrillation. The mean heart rate was 52.48 beats per minute. Other EKG findings include: PVCs.  LEG MOVEMENT DATA The total Periodic Limb Movements of Sleep (PLMS) were 80. The PLMS index was 14.18 .  IMPRESSIONS - Moderate obstructive sleep apnea occurred during the diagnostic portion of the study(AHI = 18.6/hour). An optimal PAP pressure was selected for this patient ( 8 cm of water) - No significant central sleep apnea occurred during the diagnostic portion of the study (CAI = 0.0/hour). - Moderate oxygen desaturation was noted during the diagnostic portion of the study (Min O2 =81.00%). On final CPAP O2 sat - The patient snored with moderate snoring volume during the diagnostic portion of the study. - EKG findings include atrial fibrillation with mean heart rate 52/ min,  PVCs. - Clinically significant periodic limb movements did not occur during sleep.  DIAGNOSIS - Obstructive Sleep Apnea (327.23 [G47.33 ICD-10])  RECOMMENDATIONS - Trial of CPAP therapy on 8 cm H2O with a Small size Resmed Full Face Mask AirFit F10 mask and heated humidification. - Be careful with alcohol, sedatives and other CNS depressants that may worsen sleep apnea and disrupt normal sleep architecture. - Sleep hygiene  should be reviewed to assess factors that may improve sleep quality. - Weight management and regular exercise should be initiated or continued.  [Electronically signed] 10/26/2017 10:19  AM  Baird Lyons MD, [place], American Board of Sleep Medicine   NPI: [id number]

## 2017-11-04 ENCOUNTER — Encounter (HOSPITAL_COMMUNITY): Payer: Medicare HMO | Admitting: Internal Medicine

## 2017-11-04 ENCOUNTER — Telehealth: Payer: Self-pay

## 2017-11-04 DIAGNOSIS — G4733 Obstructive sleep apnea (adult) (pediatric): Secondary | ICD-10-CM

## 2017-11-04 NOTE — Telephone Encounter (Signed)
Called patient's phone but she did not answer, LVM offering for her to call back. I then called her son's # but he stated he was not with her, and gave me the same number I already tried for her.  Will wait to hear back from patient, or will call her again tomorrow.   Latrelle Dodrill, MD

## 2017-11-04 NOTE — Telephone Encounter (Signed)
Patient called and wants her results from sleep study. She is concerned about her CPAP machine. She can be reached at [phone]. If she is not available she said you can call her at her son Fayrene Fearing' number [phone]. Thanks.Glennie Hawk

## 2017-11-05 ENCOUNTER — Other Ambulatory Visit (HOSPITAL_COMMUNITY): Payer: Self-pay

## 2017-11-05 ENCOUNTER — Telehealth: Payer: Self-pay | Admitting: Family Medicine

## 2017-11-05 DIAGNOSIS — Z8719 Personal history of other diseases of the digestive system: Secondary | ICD-10-CM | POA: Diagnosis not present

## 2017-11-05 DIAGNOSIS — Z950 Presence of cardiac pacemaker: Secondary | ICD-10-CM | POA: Diagnosis not present

## 2017-11-05 DIAGNOSIS — Z7901 Long term (current) use of anticoagulants: Secondary | ICD-10-CM | POA: Diagnosis not present

## 2017-11-05 DIAGNOSIS — I482 Chronic atrial fibrillation: Secondary | ICD-10-CM | POA: Diagnosis not present

## 2017-11-05 DIAGNOSIS — K439 Ventral hernia without obstruction or gangrene: Secondary | ICD-10-CM | POA: Diagnosis not present

## 2017-11-05 DIAGNOSIS — Z9889 Other specified postprocedural states: Secondary | ICD-10-CM | POA: Diagnosis not present

## 2017-11-05 NOTE — Telephone Encounter (Signed)
Addressed in separate phone encounter, see that encounter for details Latrelle Dodrill, MD

## 2017-11-05 NOTE — Progress Notes (Signed)
Paramedicine Encounter    Patient ID: Misty Gray, female    DOB: 01/09/1951, 66 y.o.   MRN: [id number]   Patient Care Team: Latrelle DodrillMcIntyre, Brittany J, MD as PCP - General (Family Medicine) Hillis RangeAllred, James, MD (Cardiology)  Patient Active Problem List   Diagnosis Date Noted  . Rash and nonspecific skin eruption 09/15/2017  . Ventral hernia without obstruction or gangrene 08/15/2017  . Skin irritation 07/23/2017  . Chronic venous stasis dermatitis 12/26/2016  . Urinary tract infection without hematuria   . Frequent PVCs   . Hypokalemia   . Housing problems 11/23/2016  . Chronic atrial fibrillation (HCC) 11/01/2016  . Hypotension 10/31/2016  . Special screening for malignant neoplasms, colon 08/12/2016  . Heme positive stool 08/09/2016  . Intertrigo 08/09/2016  . Chronic anticoagulation 04/26/2016  . Chronic venous insufficiency   . Preventative health care 10/05/2014  . Breast mass, left 10/05/2014  . Osteopenia 09/15/2014  . Hypertension 01/04/2014  . Chronic systolic dysfunction of left ventricle 05/05/2013  . At high risk for falls 02/11/2013  . Vitamin D deficiency 07/15/2012  . Sinoatrial node dysfunction (HCC) 05/08/2011  . Allergic rhinitis 05/31/2008  . Morbid obesity (HCC) 02/12/2007  . GASTROESOPHAGEAL REFLUX, NO ESOPHAGITIS 02/12/2007  . Female stress incontinence 02/12/2007  . Seizure disorder (HCC) 02/12/2007  . OSA (obstructive sleep apnea) 02/12/2007    Current Outpatient Medications:  .  amiodarone (PACERONE) 200 MG tablet, Take 1 tablet (200 mg total) by mouth daily., Disp: 30 tablet, Rfl: 3 .  cholecalciferol (VITAMIN D) 1000 units tablet, Take 1 tablet (1,000 Units total) by mouth daily., Disp: 90 tablet, Rfl: 3 .  edoxaban (SAVAYSA) 60 MG TABS tablet, Take 60 mg by mouth daily., Disp: 30 tablet, Rfl: 11 .  fluticasone (FLONASE) 50 MCG/ACT nasal spray, Place 2 sprays into both nostrils daily as needed. For congestion., Disp: 16 g, Rfl: 1 .  nystatin cream  (MYCOSTATIN), Apply to affected area 2 times daily for 3 weeks., Disp: 30 g, Rfl: 1 .  omeprazole (PRILOSEC) 20 MG capsule, Take 1 capsule (20 mg total) by mouth daily., Disp: 90 capsule, Rfl: 1 .  PHENobarbital (LUMINAL) 32.4 MG tablet, Take 2 tablets (64.8 mg total) by mouth 2 (two) times daily., Disp: 120 tablet, Rfl: 5 .  potassium chloride (K-DUR,KLOR-CON) 10 MEQ tablet, Take 4 tablets (40 mEq total) by mouth 3 (three) times daily., Disp: 360 tablet, Rfl: 3 .  torsemide (DEMADEX) 20 MG tablet, Take 2 tablets (40 mg total) by mouth 2 (two) times daily., Disp: 120 tablet, Rfl: 3 .  triamcinolone ointment (KENALOG) 0.5 %, Apply 1 application topically 3 (three) times daily., Disp: 60 g, Rfl: 0 .  metolazone (ZAROXOLYN) 2.5 MG tablet, Take 1 tablet (2.5 mg total) by mouth as needed (weight gain or shortness of breath). Take as directed (Patient not taking: Reported on 09/03/2017), Disp: 15 tablet, Rfl: 1 Allergies  Allergen Reactions  . Aspirin Hives and Rash  . Penicillins Rash and Other (See Comments)    Has patient had a PCN reaction causing immediate rash, facial/tongue/throat swelling, SOB or lightheadedness with hypotension: Yes Has patient had a PCN reaction causing severe rash involving mucus membranes or skin necrosis: No Has patient had a PCN reaction that required hospitalization No Has patient had a PCN reaction occurring within the last 10 years: Yes If all of the above answers are "NO", then may proceed with Cephalosporin use.   . Tape Other (See Comments)    NO PAPER TAPE-MUST BE  MEDICAL TAPE - unknown reaction  . Wool Alcohol [Lanolin] Hives  . Amoxicillin Rash  . Ampicillin Itching and Rash     Social History   Socioeconomic History  . Marital status: Married    Spouse name: [name]  . Number of children: 4  . Years of education: 93  . Highest education level: Not on file  Social Needs  . Financial resource strain: Not on file  . Food insecurity - worry: Not on  file  . Food insecurity - inability: Not on file  . Transportation needs - medical: Not on file  . Transportation needs - non-medical: Not on file  Occupational History  . Occupation: disabled  Tobacco Use  . Smoking status: Former Smoker    Packs/day: 1.00    Years: 7.00    Pack years: 7.00    Types: Cigarettes    Last attempt to quit: 03/16/1969    Years since quitting: 48.6  . Smokeless tobacco: Never Used  Substance and Sexual Activity  . Alcohol use: No  . Drug use: No  . Sexual activity: Not Currently    Birth control/protection: Post-menopausal  Other Topics Concern  . Not on file  Social History Narrative   Lives with husband [name] lives with them as do his 2 children   Daughter, [name], and her 3 children live in [place] -  [name] is incarcerated   Nephew, Baisley Gassner lives with them   Daughter, [name], in [name] [address]   Moved June 2013 to a better home on [facility].      Lives with husband, son, grandson, granddaughter, nephew.    Physical Exam      Future Appointments  Date Time Provider Department Center  12/22/2017 11:20 AM [name], [name], MD [facility] None   BP 122/72   Pulse 64   Resp 15   Wt 159 lb (72.1 kg)   BMI 29.08 kg/m  Weight yesterday-162 Last visit weight-162   Pt reports that she is doing good, she had to resch her clinic appoint yesterday to January due to transportation. She went to GI doc in [place] today and they recommended she wear a type of girdle to keep pressure on her hernias.  Contacted the pharmacy and they will deliver her meds on Friday.  She reports her breathing is doing good. She will have to put back on CPAP from her sleep study.  No swelling noted. Lungs clear. No missed doses of her meds. meds verified. She uses blister packs of meds.   ACTION: Home visit completed  Kerry Hough, EMT-Paramedic 11/05/17

## 2017-11-05 NOTE — Telephone Encounter (Signed)
Called patient & discussed sleep study results with her, including need for CPAP. She states she has a CPAP at home but the mask is not working correctly.  Order entered for new CPAP & mask as recommended on sleep study official read.  Lauren, can you help make sure she gets this DME? Thanks!  Latrelle Dodrill, MD

## 2017-11-05 NOTE — Telephone Encounter (Signed)
Pt calling back to return call from PCP. Wants to know her results from her sleep test. Please advise

## 2017-11-10 NOTE — Telephone Encounter (Signed)
Henderson Newcomer at [facility] notified that order has been placed for CPAP and mask. Kinnie Feil, RN, BSN

## 2017-11-11 ENCOUNTER — Other Ambulatory Visit (HOSPITAL_COMMUNITY): Payer: Self-pay

## 2017-11-11 NOTE — Progress Notes (Signed)
Pt contacted me yesterday regarding her bubble pack of medications and they were packaged incorrectly. I went by there this morning and looked at them and they only had 1 potassium in tere 3x a day and it should be 4 tabs 3x a day--I took it back to pharmacy for them to correct it and then I took it back to pt.

## 2017-11-15 DIAGNOSIS — I279 Pulmonary heart disease, unspecified: Secondary | ICD-10-CM | POA: Diagnosis not present

## 2017-11-15 DIAGNOSIS — G4733 Obstructive sleep apnea (adult) (pediatric): Secondary | ICD-10-CM | POA: Diagnosis not present

## 2017-11-15 DIAGNOSIS — Q871 Congenital malformation syndromes predominantly associated with short stature: Secondary | ICD-10-CM | POA: Diagnosis not present

## 2017-11-15 DIAGNOSIS — R0902 Hypoxemia: Secondary | ICD-10-CM | POA: Diagnosis not present

## 2017-11-19 DIAGNOSIS — R0602 Shortness of breath: Secondary | ICD-10-CM | POA: Diagnosis not present

## 2017-11-19 DIAGNOSIS — R0902 Hypoxemia: Secondary | ICD-10-CM | POA: Diagnosis not present

## 2017-11-19 DIAGNOSIS — Q871 Congenital malformation syndromes predominantly associated with short stature: Secondary | ICD-10-CM | POA: Diagnosis not present

## 2017-11-19 DIAGNOSIS — I279 Pulmonary heart disease, unspecified: Secondary | ICD-10-CM | POA: Diagnosis not present

## 2017-11-19 DIAGNOSIS — G4733 Obstructive sleep apnea (adult) (pediatric): Secondary | ICD-10-CM | POA: Diagnosis not present

## 2017-12-02 ENCOUNTER — Other Ambulatory Visit (HOSPITAL_COMMUNITY): Payer: Self-pay | Admitting: Student

## 2017-12-02 ENCOUNTER — Other Ambulatory Visit: Payer: Self-pay | Admitting: Family Medicine

## 2017-12-16 DIAGNOSIS — G4733 Obstructive sleep apnea (adult) (pediatric): Secondary | ICD-10-CM | POA: Diagnosis not present

## 2017-12-20 DIAGNOSIS — R0902 Hypoxemia: Secondary | ICD-10-CM | POA: Diagnosis not present

## 2017-12-20 DIAGNOSIS — G4733 Obstructive sleep apnea (adult) (pediatric): Secondary | ICD-10-CM | POA: Diagnosis not present

## 2017-12-22 ENCOUNTER — Ambulatory Visit (HOSPITAL_COMMUNITY)
Admission: RE | Admit: 2017-12-22 | Discharge: 2017-12-22 | Disposition: A | Discharge status: Home / Self Care | Payer: Medicare Other | Source: Ambulatory Visit | Attending: Internal Medicine | Admitting: Internal Medicine

## 2017-12-22 ENCOUNTER — Encounter (HOSPITAL_COMMUNITY): Payer: Self-pay | Admitting: Internal Medicine

## 2017-12-22 ENCOUNTER — Other Ambulatory Visit (HOSPITAL_COMMUNITY): Payer: Self-pay

## 2017-12-22 VITALS — BP 118/56 | HR 51 | Wt 178.5 lb

## 2017-12-22 DIAGNOSIS — Z8673 Personal history of transient ischemic attack (TIA), and cerebral infarction without residual deficits: Secondary | ICD-10-CM | POA: Diagnosis not present

## 2017-12-22 DIAGNOSIS — I5022 Chronic systolic (congestive) heart failure: Secondary | ICD-10-CM | POA: Diagnosis not present

## 2017-12-22 DIAGNOSIS — K219 Gastro-esophageal reflux disease without esophagitis: Secondary | ICD-10-CM | POA: Insufficient documentation

## 2017-12-22 DIAGNOSIS — I482 Chronic atrial fibrillation, unspecified: Secondary | ICD-10-CM

## 2017-12-22 DIAGNOSIS — I519 Heart disease, unspecified: Secondary | ICD-10-CM | POA: Diagnosis not present

## 2017-12-22 DIAGNOSIS — E7800 Pure hypercholesterolemia, unspecified: Secondary | ICD-10-CM | POA: Insufficient documentation

## 2017-12-22 DIAGNOSIS — I493 Ventricular premature depolarization: Secondary | ICD-10-CM | POA: Diagnosis not present

## 2017-12-22 DIAGNOSIS — G4733 Obstructive sleep apnea (adult) (pediatric): Secondary | ICD-10-CM | POA: Diagnosis not present

## 2017-12-22 DIAGNOSIS — Z950 Presence of cardiac pacemaker: Secondary | ICD-10-CM | POA: Diagnosis not present

## 2017-12-22 DIAGNOSIS — Z87891 Personal history of nicotine dependence: Secondary | ICD-10-CM | POA: Diagnosis not present

## 2017-12-22 DIAGNOSIS — I495 Sick sinus syndrome: Secondary | ICD-10-CM | POA: Insufficient documentation

## 2017-12-22 DIAGNOSIS — I110 Hypertensive heart disease with heart failure: Secondary | ICD-10-CM | POA: Diagnosis not present

## 2017-12-22 DIAGNOSIS — M170 Bilateral primary osteoarthritis of knee: Secondary | ICD-10-CM | POA: Insufficient documentation

## 2017-12-22 DIAGNOSIS — I429 Cardiomyopathy, unspecified: Secondary | ICD-10-CM | POA: Insufficient documentation

## 2017-12-22 DIAGNOSIS — M19042 Primary osteoarthritis, left hand: Secondary | ICD-10-CM | POA: Diagnosis not present

## 2017-12-22 DIAGNOSIS — M19041 Primary osteoarthritis, right hand: Secondary | ICD-10-CM | POA: Diagnosis not present

## 2017-12-22 DIAGNOSIS — Z9981 Dependence on supplemental oxygen: Secondary | ICD-10-CM | POA: Insufficient documentation

## 2017-12-22 DIAGNOSIS — Z7901 Long term (current) use of anticoagulants: Secondary | ICD-10-CM | POA: Insufficient documentation

## 2017-12-22 DIAGNOSIS — Z79899 Other long term (current) drug therapy: Secondary | ICD-10-CM | POA: Insufficient documentation

## 2017-12-22 LAB — BASIC METABOLIC PANEL
ANION GAP: 8 (ref 5–15)
BUN: 29 mg/dL — AB (ref 6–20)
CO2: 28 mmol/L (ref 22–32)
Calcium: 9.2 mg/dL (ref 8.9–10.3)
Chloride: 103 mmol/L (ref 101–111)
Creatinine, Ser: 1.05 mg/dL — ABNORMAL HIGH (ref 0.44–1.00)
GFR calc Af Amer: >60 mL/min (ref >60)
GFR, EST NON AFRICAN AMERICAN: 53 mL/min — AB (ref >60)
Glucose, Bld: 106 mg/dL — ABNORMAL HIGH (ref 65–99)
POTASSIUM: 4.1 mmol/L (ref 3.5–5.1)
SODIUM: 139 mmol/L (ref 135–145)

## 2017-12-22 NOTE — Progress Notes (Signed)
ADVANCED HF CLINIC NOTE Primary HF: Dr. Gala Romney   HPI: Misty Gray is a 67 y/o woman with history of morbid obesity, OSA, chronic atrial fibrillation with tachy-brady syndrome s/p PPM, frequent PVCs, systolic HF due to NICM.  Admitted for ADHF in 10/17. Cath with normal coronary arteries and elevated filling pressures. Echo 10/17 EF 30-35% (previously 35-40%). 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 (She has 81% RV pacing and 15-20 PVCs per minute). Started on po amio with suppression of PVCs. Diuresed 41 pounds. Discharge weight 197.   Admitted 12/14 through 12/06/2016 with marked volume overload. Diuresed with IV lasix + metolazone. Discharge weight was 195 pounds.Transitioned to torsemide 40 mg in am and 20 mg in pm.   Pt presents today for regular follow up. Here with paramedicine. Doing well. Weight stable at home 158-161. Stable DOE with mild exertion. Denies CP. No problems with medicines. No bloating. No orthopnea or PND.   Echo 3/18 reviewed personally. EF 15-20% moderate MR/TR  RV modrately HK   R/LHC 10/03/16 Ao = 94/58 (72) LV = 104/20 RA = 21 RV = 65/4/19 PA = 65/21 (38) PCW = 23 Fick cardiac output/index = 6.1/2.9 PVR = 2.5 WU FA sat = 97%  PA sat = 69%, 70%  Assessment: 1. Normal coronary arteries 2. NICM with EF 30-35% by echo 3. Persistently elevated biventricular pressures    Past Medical History:  Diagnosis Date  . Abnormal CT of the head 12/16/1984   R parietal atrophy  . Allergic rhinitis, cause unspecified   . Altered mental status 11/28/2016  . Anemia   . Arthritis    "hands and knees" (09/06/2015)  . Atrial fibrillation (HCC)   . Cellulitis 09/07/2015  . Cellulitis and abscess of leg 09/2016   bilateral  . CHF (congestive heart failure) (HCC)   . Diaphragmatic hernia without mention of obstruction or gangrene   . Dyspnea   . Exertional dyspnea 06/22/12  . Family history of adverse reaction to anesthesia    "daughter fights w/them; just can't  relax during OR; stay awake during the OR"  . Fatigue 06/22/2012  . Female stress incontinence   . GERD (gastroesophageal reflux disease)   . Grand mal seizure (HCC)   . H/O hiatal hernia    two removed  . Heart murmur   . High cholesterol   . History of blood transfusion 1956   "related to nose bleed"  . Hypertension   . Influenza A 01/11/2014  . Lichenification and lichen simplex chronicus   . Migraine    "when I was a teenager"  . Morbid obesity (HCC)   . Nonischemic cardiomyopathy (HCC)    EF has normalized-repeat Pending   . On home oxygen therapy    "2L when I'm asleep in bed" (09/06/2015)  . OSA on CPAP   . Pacemaker  st judes    Patient states "this is my third pacemaker"  . Pneumonia 2004; 2011; 11/2011  . Seizures (HCC) since 1981   "can hear you talking but sounds like you are in big tunnel; have them often if not taking RX; last one was 06/17/12" (09/06/2015)  . Shortness of breath 10/24/2010   Qualifier: Diagnosis of  By: [place], Bonnie    . Sick sinus syndrome (HCC)    WITH PRIOR DDD PACEMAKER IMPLANTATION  . Somnolence   . Stroke [facility]) 1988   "mouth drawed real bad on my left side; found out it was a seizure"  . Syncope and collapse 06/22/12   "  hit forehead and left knee"; denies loss of consciousness  . Unspecified venous (peripheral) insufficiency     Current Outpatient Medications  Medication Sig Dispense Refill  . amiodarone (PACERONE) 200 MG tablet Take 1 tablet (200 mg total) by mouth daily. 30 tablet 3  . cholecalciferol (VITAMIN D) 1000 units tablet Take 1 tablet (1,000 Units total) by mouth daily. 90 tablet 3  . edoxaban (SAVAYSA) 60 MG TABS tablet Take 60 mg by mouth daily. 30 tablet 11  . fluticasone (FLONASE) 50 MCG/ACT nasal spray Place 2 sprays into both nostrils daily as needed. For congestion. 16 g 1  . metolazone (ZAROXOLYN) 2.5 MG tablet Take 1 tablet (2.5 mg total) by mouth as needed (weight gain or shortness of breath). Take as directed 15 tablet  1  . nystatin cream (MYCOSTATIN) Apply to affected area 2 times daily for 3 weeks. 30 g 1  . omeprazole (PRILOSEC) 20 MG capsule Take 1 capsule by mouth daily 90 capsule 1  . PHENobarbital (LUMINAL) 32.4 MG tablet Take 2 tablets (64.8 mg total) by mouth 2 (two) times daily. 120 tablet 5  . potassium chloride (K-DUR,KLOR-CON) 10 MEQ tablet Take 4 tablets (40 mEq total) by mouth 3 (three) times daily. Morning, Noon, and Night 336 tablet 2  . torsemide (DEMADEX) 20 MG tablet Take 2 tablets (40 mg total) by mouth 2 (two) times daily. 120 tablet 3  . triamcinolone ointment (KENALOG) 0.5 % Apply 1 application topically 3 (three) times daily. 60 g 0   No current facility-administered medications for this encounter.     Allergies  Allergen Reactions  . Aspirin Hives and Rash  . Penicillins Rash and Other (See Comments)    Has patient had a PCN reaction causing immediate rash, facial/tongue/throat swelling, SOB or lightheadedness with hypotension: Yes Has patient had a PCN reaction causing severe rash involving mucus membranes or skin necrosis: No Has patient had a PCN reaction that required hospitalization No Has patient had a PCN reaction occurring within the last 10 years: Yes If all of the above answers are "NO", then may proceed with Cephalosporin use.   . Tape Other (See Comments)    NO PAPER TAPE-MUST BE MEDICAL TAPE - unknown reaction  . Wool Alcohol [Lanolin] Hives  . Amoxicillin Rash  . Ampicillin Itching and Rash      Social History   Socioeconomic History  . Marital status: Married    Spouse name: [name]  . Number of children: 4  . Years of education: 59  . Highest education level: Not on file  Social Needs  . Financial resource strain: Not on file  . Food insecurity - worry: Not on file  . Food insecurity - inability: Not on file  . Transportation needs - medical: Not on file  . Transportation needs - non-medical: Not on file  Occupational History  . Occupation:  disabled  Tobacco Use  . Smoking status: Former Smoker    Packs/day: 1.00    Years: 7.00    Pack years: 7.00    Types: Cigarettes    Last attempt to quit: 03/16/1969    Years since quitting: 48.8  . Smokeless tobacco: Never Used  Substance and Sexual Activity  . Alcohol use: No  . Drug use: No  . Sexual activity: Not Currently    Birth control/protection: Post-menopausal  Other Topics Concern  . Not on file  Social History Narrative   Lives with husband [name] lives with them  as do his 2 children   Daughter, [name], and her 3 children live in [place]   Son -  [name] is incarcerated   Nephew, Aleta Manternach lives with them   Daughter, [name], in [facility] [name] [address]   Moved June 2013 to a better home on [facility].      Lives with husband, son, grandson, granddaughter, nephew.    Family History  Problem Relation Age of Onset  . Stroke Father        died from CAD?  Marland Kitchen Heart disease Father   . Cancer Mother   . Diabetes Son        Type 1  . Sleep apnea Son   . Cancer Sister        cervical  . Hypertension Sister   . Hypertension Brother   . Rheum arthritis Daughter        Also PGM, PGGM   Vitals:   12/22/17 1054  BP: (!) 118/56  Pulse: (!) 51  SpO2: 100%  Weight: 178 lb 8 oz (81 kg)   Wt Readings from Last 3 Encounters:  12/22/17 178 lb 8 oz (81 kg)  11/05/17 159 lb (72.1 kg)  10/21/17 161 lb (73 kg)   PHYSICAL EXAM:  General:  Elderly female . No resp difficulty HEENT: normal Neck: supple. JVP 7 +HJR Carotids 2+ bilat; no bruits. No lymphadenopathy or thryomegaly appreciated. Cor: PMI nondisplaced. Huston Foley. Regular. No rubs, gallops or murmurs. Lungs: clear Abdomen: obese soft, nontender, nondistended. No hepatosplenomegaly. No bruits or masses. Good bowel sounds. Extremities: no cyanosis, clubbing, rash, 1+ edema + venous stasis changes  Neuro: alert & orientedx3, cranial nerves grossly intact. moves  all 4 extremities w/o difficulty. Affect pleasant  ECG with sinus bradycardia and v pacing at 45 Arvilla Meres, MD  2:58 PM    ASSESSMENT & PLAN: 1. Chronic systolic HF due to NICM. ? PVC induced vs RV pacing. Echo 12/06/2016 EF 10-15%. Cath 10/17 normal coronary arteries.  Echo 02/2017 with LVEF 15-20% despite PVC suppression. Echo 07/2017 EF 25-30%.  - NYHA class III symptoms chronically. Improved with Paramedicine  - Volume status minimally elevated on exam  - Continue toresemide 40mg  BID. Take extra as needed including today - Unable to tolerate beta blocker. - Has failed losartan 12.5 mg hs due to hypotension.  - Reinforced dietary and fluid restrictions.  - Previously pacer switched to AAI pacing to limit RV pacing and RV pacing dropped to 16%. On ECG today she is in sinus brady (previously in AF). We interrogated pacer at bedside and it appears that RV lead may actually be in coronary sinus and pacing LV (?) Pacing parameters adjusted to facilitate A-pacing at 60. She will follow with Dr. Johney Frame    2. Frequent PVCs - Suppressed. Continue Amio 200 mg daily.  No change.  3. Obesity  - Encouraged her to increase activity. No change.  4. Chronic AF with tach brady s/p pacemaker - In NSR today after being started on amio for PVC suppression  - Continue edoxaban. Denies melena and hematochezia.  - Device interrogation recently showed 15% V pacing. Previously around 41%. See discussion above for PM adjustment   Labs today.   Total time spent . Over half that time spent discussing above.    Arvilla Meres, MD  11:42 AM

## 2017-12-22 NOTE — Patient Instructions (Signed)
Labs today  Your physician recommends that you schedule a follow-up appointment in: 4 months  

## 2017-12-22 NOTE — Progress Notes (Signed)
Paramedicine Encounter    Patient ID: Misty Gray, female    DOB: 10-08-1951, 67 y.o.   MRN: [id number]   Patient Care Team: Misty Dodrill, MD as PCP - General (Family Medicine) Misty Range, MD (Cardiology)  Patient Active Problem List   Diagnosis Date Noted  . Rash and nonspecific skin eruption 09/15/2017  . Ventral hernia without obstruction or gangrene 08/15/2017  . Skin irritation 07/23/2017  . Chronic venous stasis dermatitis 12/26/2016  . Urinary tract infection without hematuria   . Frequent PVCs   . Hypokalemia   . Housing problems 11/23/2016  . Chronic atrial fibrillation (HCC) 11/01/2016  . Hypotension 10/31/2016  . Special screening for malignant neoplasms, colon 08/12/2016  . Heme positive stool 08/09/2016  . Intertrigo 08/09/2016  . Chronic anticoagulation 04/26/2016  . Chronic venous insufficiency   . Preventative health care 10/05/2014  . Breast mass, left 10/05/2014  . Osteopenia 09/15/2014  . Hypertension 01/04/2014  . Chronic systolic dysfunction of left ventricle 05/05/2013  . At high risk for falls 02/11/2013  . Vitamin D deficiency 07/15/2012  . Sinoatrial node dysfunction (HCC) 05/08/2011  . Allergic rhinitis 05/31/2008  . Morbid obesity (HCC) 02/12/2007  . GASTROESOPHAGEAL REFLUX, NO ESOPHAGITIS 02/12/2007  . Female stress incontinence 02/12/2007  . Seizure disorder (HCC) 02/12/2007  . OSA (obstructive sleep apnea) 02/12/2007    Current Outpatient Medications:  .  amiodarone (PACERONE) 200 MG tablet, Take 1 tablet (200 mg total) by mouth daily., Disp: 30 tablet, Rfl: 3 .  cholecalciferol (VITAMIN D) 1000 units tablet, Take 1 tablet (1,000 Units total) by mouth daily., Disp: 90 tablet, Rfl: 3 .  edoxaban (SAVAYSA) 60 MG TABS tablet, Take 60 mg by mouth daily., Disp: 30 tablet, Rfl: 11 .  fluticasone (FLONASE) 50 MCG/ACT nasal spray, Place 2 sprays into both nostrils daily as needed. For congestion., Disp: 16 g, Rfl: 1 .  metolazone  (ZAROXOLYN) 2.5 MG tablet, Take 1 tablet (2.5 mg total) by mouth as needed (weight gain or shortness of breath). Take as directed, Disp: 15 tablet, Rfl: 1 .  nystatin cream (MYCOSTATIN), Apply to affected area 2 times daily for 3 weeks., Disp: 30 g, Rfl: 1 .  omeprazole (PRILOSEC) 20 MG capsule, Take 1 capsule by mouth daily, Disp: 90 capsule, Rfl: 1 .  PHENobarbital (LUMINAL) 32.4 MG tablet, Take 2 tablets (64.8 mg total) by mouth 2 (two) times daily., Disp: 120 tablet, Rfl: 5 .  potassium chloride (K-DUR,KLOR-CON) 10 MEQ tablet, Take 4 tablets (40 mEq total) by mouth 3 (three) times daily. Morning, Noon, and Night, Disp: 336 tablet, Rfl: 2 .  torsemide (DEMADEX) 20 MG tablet, Take 2 tablets (40 mg total) by mouth 2 (two) times daily., Disp: 120 tablet, Rfl: 3 .  triamcinolone ointment (KENALOG) 0.5 %, Apply 1 application topically 3 (three) times daily., Disp: 60 g, Rfl: 0 Allergies  Allergen Reactions  . Aspirin Hives and Rash  . Penicillins Rash and Other (See Comments)    Has patient had a PCN reaction causing immediate rash, facial/tongue/throat swelling, SOB or lightheadedness with hypotension: Yes Has patient had a PCN reaction causing severe rash involving mucus membranes or skin necrosis: No Has patient had a PCN reaction that required hospitalization No Has patient had a PCN reaction occurring within the last 10 years: Yes If all of the above answers are "NO", then may proceed with Cephalosporin use.   . Tape Other (See Comments)    NO PAPER TAPE-MUST BE MEDICAL TAPE - unknown reaction  .  Wool Alcohol [Lanolin] Hives  . Amoxicillin Rash  . Ampicillin Itching and Rash     Social History   Socioeconomic History  . Marital status: Married    Spouse name: Misty Gray  . Number of children: 4  . Years of education: 39  . Highest education level: Not on file  Social Needs  . Financial resource strain: Not on file  . Food insecurity - worry: Not on file  . Food insecurity -  inability: Not on file  . Transportation needs - medical: Not on file  . Transportation needs - non-medical: Not on file  Occupational History  . Occupation: disabled  Tobacco Use  . Smoking status: Former Smoker    Packs/day: 1.00    Years: 7.00    Pack years: 7.00    Types: Cigarettes    Last attempt to quit: 03/16/1969    Years since quitting: 48.8  . Smokeless tobacco: Never Used  Substance and Sexual Activity  . Alcohol use: No  . Drug use: No  . Sexual activity: Not Currently    Birth control/protection: Post-menopausal  Other Topics Concern  . Not on file  Social History Narrative   Lives with husband Misty Gray - Misty Gray lives with them as do his 2 children   Daughter, Misty Gray, and her 3 children live in [place] -  Misty Gray is incarcerated   Nephew, Misty Gray lives with them   Daughter, Misty Gray, in [facility] [name] [address]   Moved June 2013 to a better home on [facility].      Lives with husband, son, grandson, granddaughter, nephew.    Physical Exam      Future Appointments  Date Time Provider Department Center  12/22/2017 11:20 AM [name], [name], MD [facility] None   Clinic visit today.  She reports no issues with her meds-no errors from this past delivery.  Weight at clinic-178 Weight @ home-159 last visit  Her scales yesterday read 161 lbs.  [facility].   She has some swelling to her legs.  She reports her insurance has changed to [facility] from [place]. Will see her in 2 wks.  She was instructed to take additional fluid pill today when she gets home.   ACTION: Home visit completed  Misty Gray, EMT-Paramedic 12/22/17

## 2017-12-26 ENCOUNTER — Other Ambulatory Visit (HOSPITAL_COMMUNITY): Payer: Self-pay | Admitting: Internal Medicine

## 2017-12-26 ENCOUNTER — Other Ambulatory Visit (HOSPITAL_COMMUNITY): Payer: Self-pay | Admitting: Student

## 2018-01-05 ENCOUNTER — Other Ambulatory Visit (HOSPITAL_COMMUNITY): Payer: Self-pay | Admitting: Student

## 2018-01-06 ENCOUNTER — Telehealth (HOSPITAL_COMMUNITY): Payer: Self-pay
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
## 2018-01-07 ENCOUNTER — Other Ambulatory Visit (HOSPITAL_COMMUNITY): Payer: Self-pay

## 2018-01-07 NOTE — Progress Notes (Signed)
Paramedicine Encounter    Patient ID: Misty Gray, female    DOB: 08-Mar-1951, 67 y.o.   MRN: [id number]   Patient Care Team: Latrelle Dodrill, MD as PCP - General (Family Medicine) [name], MD (Cardiology)  Patient Active Problem List   Diagnosis Date Noted  . Rash and nonspecific skin eruption 09/15/2017  . Ventral hernia without obstruction or gangrene 08/15/2017  . Skin irritation 07/23/2017  . Chronic venous stasis dermatitis 12/26/2016  . Urinary tract infection without hematuria   . Frequent PVCs   . Hypokalemia   . Housing problems 11/23/2016  . Chronic atrial fibrillation (HCC) 11/01/2016  . Hypotension 10/31/2016  . Special screening for malignant neoplasms, colon 08/12/2016  . Heme positive stool 08/09/2016  . Intertrigo 08/09/2016  . Chronic anticoagulation 04/26/2016  . Chronic venous insufficiency   . Preventative health care 10/05/2014  . Breast mass, left 10/05/2014  . Osteopenia 09/15/2014  . Hypertension 01/04/2014  . Chronic systolic dysfunction of left ventricle 05/05/2013  . At high risk for falls 02/11/2013  . Vitamin D deficiency 07/15/2012  . Sinoatrial node dysfunction (HCC) 05/08/2011  . Allergic rhinitis 05/31/2008  . Morbid obesity (HCC) 02/12/2007  . GASTROESOPHAGEAL REFLUX, NO ESOPHAGITIS 02/12/2007  . Female stress incontinence 02/12/2007  . Seizure disorder (HCC) 02/12/2007  . OSA (obstructive sleep apnea) 02/12/2007    Current Outpatient Medications:  .  amiodarone (PACERONE) 200 MG tablet, Take 1 tablet (200 mg total) by mouth daily., Disp: 28 tablet, Rfl: 6 .  cholecalciferol (VITAMIN D) 1000 units tablet, Take 1 tablet (1,000 Units total) by mouth daily., Disp: 90 tablet, Rfl: 3 .  edoxaban (SAVAYSA) 60 MG TABS tablet, Take 60 mg by mouth daily., Disp: 30 tablet, Rfl: 11 .  fluticasone (FLONASE) 50 MCG/ACT nasal spray, Place 2 sprays into both nostrils daily as needed. For congestion., Disp: 16 g, Rfl: 1 .  nystatin cream  (MYCOSTATIN), Apply to affected area 2 times daily for 3 weeks., Disp: 30 g, Rfl: 1 .  omeprazole (PRILOSEC) 20 MG capsule, Take 1 capsule by mouth daily, Disp: 90 capsule, Rfl: 1 .  PHENobarbital (LUMINAL) 32.4 MG tablet, Take 2 tablets (64.8 mg total) by mouth 2 (two) times daily., Disp: 120 tablet, Rfl: 5 .  potassium chloride (K-DUR,KLOR-CON) 10 MEQ tablet, Take 4 tablets (40 mEq total) by mouth 3 (three) times daily. Morning, Noon, and Night, Disp: 336 tablet, Rfl: 2 .  potassium chloride (K-DUR,KLOR-CON) 10 MEQ tablet, Take 4 tablets (40 mEq total) by mouth 3 (three) times daily. Morning, Noon, and Night, Disp: 336 tablet, Rfl: 2 .  torsemide (DEMADEX) 20 MG tablet, Take 2 tablets (40 mg total) by mouth 2 (two) times daily. Morning and Noon, Disp: 112 tablet, Rfl: 6 .  triamcinolone ointment (KENALOG) 0.5 %, Apply 1 application topically 3 (three) times daily., Disp: 60 g, Rfl: 0 .  metolazone (ZAROXOLYN) 2.5 MG tablet, Take 1 tablet (2.5 mg total) by mouth as needed (weight gain or shortness of breath). Take as directed (Patient not taking: Reported on 01/07/2018), Disp: 15 tablet, Rfl: 1 Allergies  Allergen Reactions  . Aspirin Hives and Rash  . Penicillins Rash and Other (See Comments)    Has patient had a PCN reaction causing immediate rash, facial/tongue/throat swelling, SOB or lightheadedness with hypotension: Yes Has patient had a PCN reaction causing severe rash involving mucus membranes or skin necrosis: No Has patient had a PCN reaction that required hospitalization No Has patient had a PCN reaction occurring within  the last 10 years: Yes If all of the above answers are "NO", then may proceed with Cephalosporin use.   . Tape Other (See Comments)    NO PAPER TAPE-MUST BE MEDICAL TAPE - unknown reaction  . Wool Alcohol [Lanolin] Hives  . Amoxicillin Rash  . Ampicillin Itching and Rash     Social History   Socioeconomic History  . Marital status: Married    Spouse name:  [name]  . Number of children: 4  . Years of education: 29  . Highest education level: Not on file  Social Needs  . Financial resource strain: Not on file  . Food insecurity - worry: Not on file  . Food insecurity - inability: Not on file  . Transportation needs - medical: Not on file  . Transportation needs - non-medical: Not on file  Occupational History  . Occupation: disabled  Tobacco Use  . Smoking status: Former Smoker    Packs/day: 1.00    Years: 7.00    Pack years: 7.00    Types: Cigarettes    Last attempt to quit: 03/16/1969    Years since quitting: 48.8  . Smokeless tobacco: Never Used  Substance and Sexual Activity  . Alcohol use: No  . Drug use: No  . Sexual activity: Not Currently    Birth control/protection: Post-menopausal  Other Topics Concern  . Not on file  Social History Narrative   Lives with husband [name] lives with them as do his 2 children   Daughter, [name], and her 3 children live in [place] -  [name] is incarcerated   Nephew, Zaya Kessenich lives with them   Daughter, [name], in [name] [address]   Moved June 2013 to a better home on [facility].      Lives with husband, son, grandson, granddaughter, nephew.    Physical Exam      Future Appointments  Date Time Provider Department Center  01/14/2018  1:45 PM Allred, Fayrene Fearing, MD [name]   BP 126/80   Pulse 60   Resp 15   Wt 173 lb (78.5 kg)   SpO2 96%   BMI 31.64 kg/m  Weight yesterday-178 Last visit weight-159 @ home  12/22/17  Spoke to pt yesterday about home visit and to make sure she got her meds--the pharmacy did bring them out when I arrived also at her home. She states her weight was up to 178 and Monday she wrote down that it was 161. It appears her weight fluctuates from 179-170-161-167--I wonder if the floor is level for her and if she is weighing in the same spot. She has had the same scale for a  long time. Maybe we need to change out the batteries. Will try that to see. Her diet also is not always low sodium choices.  Her insurance is now [facility]. Pt states no change in her sob. Minimal swelling to legs.    ACTION: Home visit completed  Kerry Hough, EMT-Paramedic 01/07/18

## 2018-01-08 ENCOUNTER — Encounter: Payer: Self-pay | Admitting: *Deleted

## 2018-01-08 ENCOUNTER — Telehealth (HOSPITAL_COMMUNITY): Payer: Self-pay | Admitting: Pharmacist

## 2018-01-08 NOTE — Telephone Encounter (Signed)
Savaysa PA approved by [employer] through 12/15/18.   [name]. Bonnye Fava, PharmD, BCPS, CPP Clinical Pharmacist Phone: [phone] 01/08/2018 9:17 AM

## 2018-01-13 ENCOUNTER — Telehealth: Payer: Self-pay | Admitting: Family Medicine

## 2018-01-13 ENCOUNTER — Telehealth: Payer: Self-pay

## 2018-01-13 NOTE — Telephone Encounter (Signed)
Received request for certification of oxygen necessity from [facility]. Patient will need to come in for a walk test for oxygen therapy.  Red team can you have her schedule an appointment with me?  Thanks, Latrelle Dodrill, MD

## 2018-01-13 NOTE — Telephone Encounter (Signed)
Called and left a message for patient to call in and schedule an appointment with Dr. Pollie Meyer to be accessed for oxygen necessity for a request from [facility]. She needs to do a walk test.Simpson, Sonny Masters, CMA

## 2018-01-14 ENCOUNTER — Encounter: Payer: Medicare Other | Admitting: Internal Medicine

## 2018-01-16 DIAGNOSIS — G4733 Obstructive sleep apnea (adult) (pediatric): Secondary | ICD-10-CM | POA: Diagnosis not present

## 2018-01-20 DIAGNOSIS — R0902 Hypoxemia: Secondary | ICD-10-CM | POA: Diagnosis not present

## 2018-01-20 DIAGNOSIS — G4733 Obstructive sleep apnea (adult) (pediatric): Secondary | ICD-10-CM | POA: Diagnosis not present

## 2018-01-29 ENCOUNTER — Ambulatory Visit (INDEPENDENT_AMBULATORY_CARE_PROVIDER_SITE_OTHER): Payer: Medicare Other | Admitting: Internal Medicine

## 2018-01-29 ENCOUNTER — Encounter: Payer: Self-pay | Admitting: Internal Medicine

## 2018-01-29 VITALS — BP 116/76 | HR 60 | Ht 62.0 in | Wt 172.0 lb

## 2018-01-29 DIAGNOSIS — I493 Ventricular premature depolarization: Secondary | ICD-10-CM | POA: Diagnosis not present

## 2018-01-29 DIAGNOSIS — Z950 Presence of cardiac pacemaker: Secondary | ICD-10-CM

## 2018-01-29 DIAGNOSIS — I5022 Chronic systolic (congestive) heart failure: Secondary | ICD-10-CM | POA: Diagnosis not present

## 2018-01-29 DIAGNOSIS — I495 Sick sinus syndrome: Secondary | ICD-10-CM

## 2018-01-29 DIAGNOSIS — I482 Chronic atrial fibrillation, unspecified: Secondary | ICD-10-CM

## 2018-01-29 LAB — CUP PACEART INCLINIC DEVICE CHECK
Battery Remaining Longevity: 62 mo
Battery Voltage: 2.89 V
Date Time Interrogation Session: 20190214152653
Implantable Lead Implant Date: 19920327
Implantable Lead Location: 753859
Implantable Pulse Generator Implant Date: 20120605
Lead Channel Impedance Value: 487.5 Ohm
Lead Channel Pacing Threshold Amplitude: 1.25 V
Lead Channel Pacing Threshold Amplitude: 1.5 V
Lead Channel Pacing Threshold Pulse Width: 0.5 ms
Lead Channel Pacing Threshold Pulse Width: 1 ms
Lead Channel Sensing Intrinsic Amplitude: 1.9 mV
Lead Channel Sensing Intrinsic Amplitude: 9 mV
Lead Channel Setting Pacing Amplitude: 2.5 V
Lead Channel Setting Sensing Sensitivity: 1.5 mV
MDC IDC LEAD IMPLANT DT: 19920327
MDC IDC LEAD LOCATION: 753860
MDC IDC MSMT LEADCHNL RA IMPEDANCE VALUE: 375 Ohm
MDC IDC MSMT LEADCHNL RA PACING THRESHOLD AMPLITUDE: 1.5 V
MDC IDC MSMT LEADCHNL RA PACING THRESHOLD PULSEWIDTH: 0.5 ms
MDC IDC MSMT LEADCHNL RV PACING THRESHOLD AMPLITUDE: 1.25 V
MDC IDC MSMT LEADCHNL RV PACING THRESHOLD PULSEWIDTH: 1 ms
MDC IDC SET LEADCHNL RV PACING AMPLITUDE: 2.5 V
MDC IDC SET LEADCHNL RV PACING PULSEWIDTH: 1 ms
MDC IDC STAT BRADY RA PERCENT PACED: 91 %
MDC IDC STAT BRADY RV PERCENT PACED: 2.7 %
Pulse Gen Model: 2210
Pulse Gen Serial Number: 7254513

## 2018-01-29 NOTE — Progress Notes (Signed)
PCP: Latrelle Dodrill, MD Primary Cardiologist:  Dr Gala Romney Primary EP:  Dr Johney Frame  Misty Gray is a 67 y.o. female who presents today for routine electrophysiology followup.  Since last being seen in our clinic, the patient reports doing reasonably well.  She is not very active at baseline.  Today, she denies symptoms of palpitations, chest pain, shortness of breath,  lower extremity edema, dizziness, presyncope, or syncope.  The patient is otherwise without complaint today.   Past Medical History:  Diagnosis Date  . Abnormal CT of the head 12/16/1984   R parietal atrophy  . Allergic rhinitis, cause unspecified   . Altered mental status 11/28/2016  . Anemia   . Arthritis    "hands and knees" (09/06/2015)  . Atrial fibrillation (HCC)   . Cellulitis 09/07/2015  . Cellulitis and abscess of leg 09/2016   bilateral  . CHF (congestive heart failure) (HCC)   . Diaphragmatic hernia without mention of obstruction or gangrene   . Dyspnea   . Exertional dyspnea 06/22/12  . Family history of adverse reaction to anesthesia    "daughter fights w/them; just can't relax during OR; stay awake during the OR"  . Fatigue 06/22/2012  . Female stress incontinence   . GERD (gastroesophageal reflux disease)   . Grand mal seizure (HCC)   . H/O hiatal hernia    two removed  . Heart murmur   . High cholesterol   . History of blood transfusion 1956   "related to nose bleed"  . Hypertension   . Influenza A 01/11/2014  . Lichenification and lichen simplex chronicus   . Migraine    "when I was a teenager"  . Morbid obesity (HCC)   . Nonischemic cardiomyopathy (HCC)    EF has normalized-repeat Pending   . On home oxygen therapy    "2L when I'm asleep in bed" (09/06/2015)  . OSA on CPAP   . Pacemaker  st judes    Patient states "this is my third pacemaker"  . Pneumonia 2004; 2011; 11/2011  . Seizures (HCC) since 1981   "can hear you talking but sounds like you are in big tunnel; have them often  if not taking RX; last one was 06/17/12" (09/06/2015)  . Shortness of breath 10/24/2010   Qualifier: Diagnosis of  By: [place], Bonnie    . Sick sinus syndrome (HCC)    WITH PRIOR DDD PACEMAKER IMPLANTATION  . Somnolence   . [facility]   "mouth drawed real bad on my left side; found out it was a seizure"  . Syncope and collapse 06/22/12   "hit forehead and left knee"; denies loss of consciousness  . Unspecified venous (peripheral) insufficiency    Past Surgical History:  Procedure Laterality Date  . APPENDECTOMY    . CARDIAC CATHETERIZATION N/A 10/03/2016   Procedure: Right/Left Heart Cath and Coronary Angiography;  Surgeon: Dolores Patty, MD;  Location: [facility] INVASIVE CV LAB;  Service: Cardiovascular;  Laterality: N/A;  . HERNIA REPAIR  ? 1988; 04/15/1998  . INSERT / REPLACE / REMOVE PACEMAKER  12/16/1990   DDD EF 30%;  Marland Kitchen INSERT / REPLACE / REMOVE PACEMAKER  07/25/2003   replaced by BB, leads are both IS1 and from 1992  . INSERT / REPLACE / REMOVE PACEMAKER  05/21/2011   JA; generator change  . LEG SKIN LESION  BIOPSY / EXCISION  05/16/2001   Lichen Planus  . VENTRAL HERNIA REPAIR  1989; 04/15/1998    ROS-  all systems are reviewed and negative except as per HPI above  Current Outpatient Medications  Medication Sig Dispense Refill  . amiodarone (PACERONE) 200 MG tablet Take 1 tablet (200 mg total) by mouth daily. 28 tablet 6  . cholecalciferol (VITAMIN D) 1000 units tablet Take 1 tablet (1,000 Units total) by mouth daily. 90 tablet 3  . edoxaban (SAVAYSA) 60 MG TABS tablet Take 60 mg by mouth daily. 30 tablet 11  . fluticasone (FLONASE) 50 MCG/ACT nasal spray Place 2 sprays into both nostrils daily as needed. For congestion. 16 g 1  . metolazone (ZAROXOLYN) 2.5 MG tablet Take 1 tablet (2.5 mg total) by mouth as needed (weight gain or shortness of breath). Take as directed (Patient not taking: Reported on 01/07/2018) 15 tablet 1  . nystatin cream (MYCOSTATIN) Apply to affected area 2 times  daily for 3 weeks. 30 g 1  . omeprazole (PRILOSEC) 20 MG capsule Take 1 capsule by mouth daily 90 capsule 1  . PHENobarbital (LUMINAL) 32.4 MG tablet Take 2 tablets (64.8 mg total) by mouth 2 (two) times daily. 120 tablet 5  . potassium chloride (K-DUR,KLOR-CON) 10 MEQ tablet Take 4 tablets (40 mEq total) by mouth 3 (three) times daily. Morning, Noon, and Night 336 tablet 2  . potassium chloride (K-DUR,KLOR-CON) 10 MEQ tablet Take 4 tablets (40 mEq total) by mouth 3 (three) times daily. Morning, Noon, and Night 336 tablet 2  . torsemide (DEMADEX) 20 MG tablet Take 2 tablets (40 mg total) by mouth 2 (two) times daily. Morning and Noon 112 tablet 6  . triamcinolone ointment (KENALOG) 0.5 % Apply 1 application topically 3 (three) times daily. 60 g 0   No current facility-administered medications for this visit.     Physical Exam: Vitals:   01/29/18 1241  BP: 116/76  Pulse: 60  Weight: 172 lb (78 kg)  Height: 5\' 2"  (1.575 m)    GEN- The patient is chronically debilitated appearing, alert and oriented x 3 today.   Head- normocephalic, atraumatic Eyes-  Sclera clear, conjunctiva pink Ears- hearing intact Oropharynx- clear Lungs- Clear to ausculation bilaterally, normal work of breathing Chest- pacemaker pocket is well healed Heart- Regular rate and rhythm, no murmurs, rubs or gallops, PMI not laterally displaced GI- soft, NT, ND, + BS Extremities- no clubbing, cyanosis, + edema  Pacemaker interrogation- reviewed in detail today,  See PACEART report  ekg tracing ordered today is personally reviewed and shows atrial paced rhythm, first degree AV block, poor R wave transition, QRS 96 msec  Assessment and Plan:  1. Symptomatic sinus bradycardia  Normal pacemaker function See Pace Art report No changes today V paces only 2.8% There was concern recently in the CHF clinic about her RV lead being in the CS.  Though the qrs morphology with RV pacing is a bit unusual, I have reviewed her  abdominal CT from 09/02/17 which reveals that her RV lead is indeed in the RV apex.  Doing well currently,  No changes toady  2. Chronic systolic dysfunction euvolemic today QRS < 130 msec, does not V pace (2.8%),  Would not benefit from CRT at this time Poor candidate for ICD May be worth repeating an echo now that she is in sinus rhythm and no longer RV pacing (previously V paced 80% when in atypical atrial flutter)  3. afib Previously permanent afib Now in sinus with amiodarone CHF clinic to follow LFTs/ TFTs  4. PVCs Well controlled  Merlin Return to see EP NP in  a year  [name] MD, [facility] 01/29/2018 12:57 PM

## 2018-01-29 NOTE — Patient Instructions (Addendum)
Medication Instructions:  Your physician recommends that you continue on your current medications as directed. Please refer to the Current Medication list given to you today.  Labwork: None ordered.  Testing/Procedures: None ordered.  Follow-Up: Your physician wants you to follow-up in: one year with Misty Balsam, NP.   You will receive a reminder letter in the mail two months in advance. If you don't receive a letter, please call our office to schedule the follow-up appointment.  Remote monitoring is used to monitor your Pacemaker from home. This monitoring reduces the number of office visits required to check your device to one time per year. It allows [place] to keep an eye on the functioning of your device to ensure it is working properly. You are scheduled for a device check from home on 04/30/2018. You may send your transmission at any time that day. If you have a wireless device, the transmission will be sent automatically. After your physician reviews your transmission, you will receive a postcard with your next transmission date.   Any Other Special Instructions Will Be Listed Below (If Applicable).  If you need a refill on your cardiac medications before your next appointment, please call your pharmacy.

## 2018-02-03 ENCOUNTER — Other Ambulatory Visit (HOSPITAL_COMMUNITY): Payer: Self-pay | Admitting: Student

## 2018-02-04 ENCOUNTER — Ambulatory Visit: Payer: Medicare Other | Admitting: Family Medicine

## 2018-02-13 DIAGNOSIS — G4733 Obstructive sleep apnea (adult) (pediatric): Secondary | ICD-10-CM | POA: Diagnosis not present

## 2018-02-17 DIAGNOSIS — G4733 Obstructive sleep apnea (adult) (pediatric): Secondary | ICD-10-CM | POA: Diagnosis not present

## 2018-02-17 DIAGNOSIS — R0902 Hypoxemia: Secondary | ICD-10-CM | POA: Diagnosis not present

## 2018-02-24 ENCOUNTER — Other Ambulatory Visit (HOSPITAL_COMMUNITY): Payer: Self-pay

## 2018-02-24 NOTE — Progress Notes (Signed)
Paramedicine Encounter    Patient ID: Misty Gray, female    DOB: June 07, 1951, 67 y.o.   MRN: [id number]   Patient Care Team: Latrelle Dodrill, MD as PCP - General (Family Medicine) [name], MD (Cardiology)  Patient Active Problem List   Diagnosis Date Noted  . Abdominal wall hernia 11/05/2017  . Anticoagulated 11/05/2017  . Rash and nonspecific skin eruption 09/15/2017  . Ventral hernia without obstruction or gangrene 08/15/2017  . Skin irritation 07/23/2017  . Chronic venous stasis dermatitis 12/26/2016  . Urinary tract infection without hematuria   . Frequent PVCs   . Hypokalemia   . Housing problems 11/23/2016  . Chronic atrial fibrillation (HCC) 11/01/2016  . Hypotension 10/31/2016  . Special screening for malignant neoplasms, colon 08/12/2016  . Heme positive stool 08/09/2016  . Intertrigo 08/09/2016  . Chronic anticoagulation 04/26/2016  . Chronic venous insufficiency   . Preventative health care 10/05/2014  . Breast mass, left 10/05/2014  . Osteopenia 09/15/2014  . Hypertension 01/04/2014  . Chronic systolic dysfunction of left ventricle 05/05/2013  . At high risk for falls 02/11/2013  . Vitamin D deficiency 07/15/2012  . Sinoatrial node dysfunction (HCC) 05/08/2011  . Pacemaker 08/10/2009  . Allergic rhinitis 05/31/2008  . Morbid obesity (HCC) 02/12/2007  . GASTROESOPHAGEAL REFLUX, NO ESOPHAGITIS 02/12/2007  . Female stress incontinence 02/12/2007  . Seizure disorder (HCC) 02/12/2007  . OSA (obstructive sleep apnea) 02/12/2007    Current Outpatient Medications:  .  amiodarone (PACERONE) 200 MG tablet, Take 1 tablet (200 mg total) by mouth daily., Disp: 28 tablet, Rfl: 6 .  cholecalciferol (VITAMIN D) 1000 units tablet, Take 1 tablet (1,000 Units total) by mouth daily., Disp: 90 tablet, Rfl: 3 .  edoxaban (SAVAYSA) 60 MG TABS tablet, Take 60 mg by mouth daily., Disp: 30 tablet, Rfl: 11 .  fluticasone (FLONASE) 50 MCG/ACT nasal spray, Place 2 sprays  into both nostrils daily as needed. For congestion., Disp: 16 g, Rfl: 1 .  nystatin cream (MYCOSTATIN), Apply to affected area 2 times daily for 3 weeks., Disp: 30 g, Rfl: 1 .  omeprazole (PRILOSEC) 20 MG capsule, Take 1 capsule by mouth daily, Disp: 90 capsule, Rfl: 1 .  PHENobarbital (LUMINAL) 32.4 MG tablet, Take 2 tablets (64.8 mg total) by mouth 2 (two) times daily., Disp: 120 tablet, Rfl: 5 .  potassium chloride (K-DUR,KLOR-CON) 10 MEQ tablet, Take 4 tablets (40 mEq total) by mouth 3 (three) times daily. Morning, Noon, and Night, Disp: 336 tablet, Rfl: 2 .  torsemide (DEMADEX) 20 MG tablet, Take 2 tablets (40 mg total) by mouth 2 (two) times daily. Morning and Noon, Disp: 112 tablet, Rfl: 6 .  triamcinolone ointment (KENALOG) 0.5 %, Apply 1 application topically 3 (three) times daily., Disp: 60 g, Rfl: 0 .  metolazone (ZAROXOLYN) 2.5 MG tablet, Take 1 tablet (2.5 mg total) by mouth as needed (weight gain or shortness of breath). Take as directed (Patient not taking: Reported on 01/07/2018), Disp: 15 tablet, Rfl: 1 .  potassium chloride (K-DUR,KLOR-CON) 10 MEQ tablet, Take 4 tablets (40 mEq total) by mouth 3 (three) times daily. Morning, Noon, and Night, Disp: 336 tablet, Rfl: 2 .  potassium chloride (K-DUR,KLOR-CON) 10 MEQ tablet, Take 4 tablets (40 mEq total) by mouth 3 (three) times daily. Morning, Noon, and Night, Disp: 360 tablet, Rfl: 6 Allergies  Allergen Reactions  . Aspirin Hives and Rash  . Penicillins Rash and Other (See Comments)    Has patient had a PCN reaction causing immediate  rash, facial/tongue/throat swelling, SOB or lightheadedness with hypotension: Yes Has patient had a PCN reaction causing severe rash involving mucus membranes or skin necrosis: No Has patient had a PCN reaction that required hospitalization No Has patient had a PCN reaction occurring within the last 10 years: Yes If all of the above answers are "NO", then may proceed with Cephalosporin use.   . Tape  Other (See Comments)    NO PAPER TAPE-MUST BE MEDICAL TAPE - unknown reaction  . Wool Alcohol [Lanolin] Hives  . Amoxicillin Rash  . Ampicillin Itching and Rash      Social History   Socioeconomic History  . Marital status: Married    Spouse name: [name]  . Number of children: 4  . Years of education: 30  . Highest education level: Not on file  Social Needs  . Financial resource strain: Not on file  . Food insecurity - worry: Not on file  . Food insecurity - inability: Not on file  . Transportation needs - medical: Not on file  . Transportation needs - non-medical: Not on file  Occupational History  . Occupation: disabled  Tobacco Use  . Smoking status: Former Smoker    Packs/day: 1.00    Years: 7.00    Pack years: 7.00    Types: Cigarettes    Last attempt to quit: 03/16/1969    Years since quitting: 48.9  . Smokeless tobacco: Never Used  Substance and Sexual Activity  . Alcohol use: No  . Drug use: No  . Sexual activity: Not Currently    Birth control/protection: Post-menopausal  Other Topics Concern  . Not on file  Social History Narrative   Lives with husband [name] lives with them as do his 2 children   Daughter, [name], and her 3 children live in [place] -  [name] is incarcerated   Nephew, Prisca Gearing lives with them   Daughter, [name], in [facility] [name] [address]   Moved June 2013 to a better home on [facility].      Lives with husband, son, grandson, granddaughter, nephew.    Physical Exam      Future Appointments  Date Time Provider Department Center  04/30/2018  9:20 AM [facility] DEVICE [facility]OT[name]    BP 118/70   Pulse 60   Resp 15   Wt 175 lb (79.4 kg)   SpO2 94%   BMI 32.01 kg/m   Weight yesterday-170 Last visit weight-173  Pt had 5lb weight gain from yesterday, she feels bloated, she did take an extra fluid pill this morning. She ate pork last night,  her pill packs are incorrect. So I am going to have to take them back to pharmacy and since there have been numerous errors with her on these meds I will have to start filling pill box myself again. The last month was correct but the next 4 weeks are incorrect.  Pt denies any dizziness, no sob, pt able to use 02 intermittently due to the fuse problem. She does have some swelling to her legs and abd.  Same issue with the pacemaker transmitter from house.  She has a slow healing wound on her left leg. Advised her to keep eye on it and contact her PCP if worsens.    Kerry Hough, EMT-Paramedic [phone] [facility] Paramedic  02/24/18

## 2018-02-26 ENCOUNTER — Other Ambulatory Visit (HOSPITAL_COMMUNITY): Payer: Self-pay

## 2018-02-26 NOTE — Progress Notes (Signed)
Dropped off pts meds-the pharmacy left out some pills so they gave me the difference and I placed it in a plastic pill box for her and will have to do this weekly until the new pharmacy, [place] takes over next month. They offer free bubble packing and delivery as well.   Kerry Hough, EMT-Paramedic  02/26/18

## 2018-03-03 ENCOUNTER — Other Ambulatory Visit (HOSPITAL_COMMUNITY): Payer: Self-pay

## 2018-03-03 ENCOUNTER — Other Ambulatory Visit (HOSPITAL_COMMUNITY): Payer: Self-pay | Admitting: Internal Medicine

## 2018-03-03 NOTE — Progress Notes (Signed)
Paramedicine Encounter    Patient ID: Misty Gray, female    DOB: August 30, 1951, 67 y.o.   MRN: [id number]   Patient Care Team: Latrelle Dodrill, MD as PCP - General (Family Medicine) [name], MD (Cardiology)  Patient Active Problem List   Diagnosis Date Noted  . Abdominal wall hernia 11/05/2017  . Anticoagulated 11/05/2017  . Rash and nonspecific skin eruption 09/15/2017  . Ventral hernia without obstruction or gangrene 08/15/2017  . Skin irritation 07/23/2017  . Chronic venous stasis dermatitis 12/26/2016  . Urinary tract infection without hematuria   . Frequent PVCs   . Hypokalemia   . Housing problems 11/23/2016  . Chronic atrial fibrillation (HCC) 11/01/2016  . Hypotension 10/31/2016  . Special screening for malignant neoplasms, colon 08/12/2016  . Heme positive stool 08/09/2016  . Intertrigo 08/09/2016  . Chronic anticoagulation 04/26/2016  . Chronic venous insufficiency   . Preventative health care 10/05/2014  . Breast mass, left 10/05/2014  . Osteopenia 09/15/2014  . Hypertension 01/04/2014  . Chronic systolic dysfunction of left ventricle 05/05/2013  . At high risk for falls 02/11/2013  . Vitamin D deficiency 07/15/2012  . Sinoatrial node dysfunction (HCC) 05/08/2011  . Pacemaker 08/10/2009  . Allergic rhinitis 05/31/2008  . Morbid obesity (HCC) 02/12/2007  . GASTROESOPHAGEAL REFLUX, NO ESOPHAGITIS 02/12/2007  . Female stress incontinence 02/12/2007  . Seizure disorder (HCC) 02/12/2007  . OSA (obstructive sleep apnea) 02/12/2007    Current Outpatient Medications:  .  amiodarone (PACERONE) 200 MG tablet, Take 1 tablet (200 mg total) by mouth daily., Disp: 28 tablet, Rfl: 6 .  cholecalciferol (VITAMIN D) 1000 units tablet, Take 1 tablet (1,000 Units total) by mouth daily., Disp: 90 tablet, Rfl: 3 .  edoxaban (SAVAYSA) 60 MG TABS tablet, Take 60 mg by mouth daily., Disp: 30 tablet, Rfl: 11 .  fluticasone (FLONASE) 50 MCG/ACT nasal spray, Place 2 sprays  into both nostrils daily as needed. For congestion., Disp: 16 g, Rfl: 1 .  nystatin cream (MYCOSTATIN), Apply to affected area 2 times daily for 3 weeks., Disp: 30 g, Rfl: 1 .  omeprazole (PRILOSEC) 20 MG capsule, Take 1 capsule by mouth daily, Disp: 90 capsule, Rfl: 1 .  PHENobarbital (LUMINAL) 32.4 MG tablet, Take 2 tablets (64.8 mg total) by mouth 2 (two) times daily., Disp: 120 tablet, Rfl: 5 .  potassium chloride (K-DUR,KLOR-CON) 10 MEQ tablet, Take 4 tablets (40 mEq total) by mouth 3 (three) times daily. Morning, Noon, and Night, Disp: 336 tablet, Rfl: 2 .  torsemide (DEMADEX) 20 MG tablet, Take 2 tablets (40 mg total) by mouth 2 (two) times daily. Morning and Noon, Disp: 112 tablet, Rfl: 6 .  triamcinolone ointment (KENALOG) 0.5 %, Apply 1 application topically 3 (three) times daily., Disp: 60 g, Rfl: 0 .  metolazone (ZAROXOLYN) 2.5 MG tablet, Take 1 tablet (2.5 mg total) by mouth as needed (weight gain or shortness of breath). Take as directed (Patient not taking: Reported on 01/07/2018), Disp: 15 tablet, Rfl: 1 .  potassium chloride (K-DUR,KLOR-CON) 10 MEQ tablet, Take 4 tablets (40 mEq total) by mouth 3 (three) times daily. Morning, Noon, and Night (Patient not taking: Reported on 03/03/2018), Disp: 336 tablet, Rfl: 2 .  potassium chloride (K-DUR,KLOR-CON) 10 MEQ tablet, Take 4 tablets (40 mEq total) by mouth 3 (three) times daily. Morning, Noon, and Night (Patient not taking: Reported on 03/03/2018), Disp: 360 tablet, Rfl: 6 Allergies  Allergen Reactions  . Aspirin Hives and Rash  . Penicillins Rash and Other (See  Comments)    Has patient had a PCN reaction causing immediate rash, facial/tongue/throat swelling, SOB or lightheadedness with hypotension: Yes Has patient had a PCN reaction causing severe rash involving mucus membranes or skin necrosis: No Has patient had a PCN reaction that required hospitalization No Has patient had a PCN reaction occurring within the last 10 years: Yes If all  of the above answers are "NO", then may proceed with Cephalosporin use.   . Tape Other (See Comments)    NO PAPER TAPE-MUST BE MEDICAL TAPE - unknown reaction  . Wool Alcohol [Lanolin] Hives  . Amoxicillin Rash  . Ampicillin Itching and Rash      Social History   Socioeconomic History  . Marital status: Married    Spouse name: [name]  . Number of children: 4  . Years of education: 58  . Highest education level: Not on file  Social Needs  . Financial resource strain: Not on file  . Food insecurity - worry: Not on file  . Food insecurity - inability: Not on file  . Transportation needs - medical: Not on file  . Transportation needs - non-medical: Not on file  Occupational History  . Occupation: disabled  Tobacco Use  . Smoking status: Former Smoker    Packs/day: 1.00    Years: 7.00    Pack years: 7.00    Types: Cigarettes    Last attempt to quit: 03/16/1969    Years since quitting: 48.9  . Smokeless tobacco: Never Used  Substance and Sexual Activity  . Alcohol use: No  . Drug use: No  . Sexual activity: Not Currently    Birth control/protection: Post-menopausal  Other Topics Concern  . Not on file  Social History Narrative   Lives with husband [name] lives with them as do his 2 children   Daughter, [name], and her 3 children live in [place] -  [name] is incarcerated   Nephew, Gidget Quizhpi lives with them   Daughter, [name], in [facility] [name] [address]   Moved June 2013 to a better home on [facility].      Lives with husband, son, grandson, granddaughter, nephew.    Physical Exam      Future Appointments  Date Time Provider Department Center  04/30/2018  9:20 AM [facility]ICE [name]    BP 128/72   Pulse 62   Resp 15   Wt 176 lb (79.8 kg)   BMI 32.19 kg/m   Weight yesterday-177 Last visit weight-175  Came out today for med rec, I got the extra pills that  pharmacy left out and placed them in a pill box for her. Pt denies any complaints today. meds verified and pill box refilled.  She took extra torsemide last week and noticed a difference for a few days but then her weight went back up. Pt reports she took extra torsemide yesterday too, she feels bloated but not sure if its due from the hernia or not. No swelling noted to her LE. She states she has cut back on her fluids and salt intake.   Kerry Hough, EMT-Paramedic [phone] [facility] Paramedic  03/03/18

## 2018-03-05 ENCOUNTER — Other Ambulatory Visit: Payer: Self-pay

## 2018-03-10 ENCOUNTER — Other Ambulatory Visit (HOSPITAL_COMMUNITY): Payer: Self-pay

## 2018-03-10 NOTE — Progress Notes (Signed)
Paramedicine Encounter    Patient ID: Misty Gray, female    DOB: [birth year], 67 y.o.   MRN: [id number]   Patient Care Team: Latrelle Dodrill, MD as PCP - General (Family Medicine) [name], MD (Cardiology)  Patient Active Problem List   Diagnosis Date Noted  . Abdominal wall hernia 11/05/2017  . Anticoagulated 11/05/2017  . Rash and nonspecific skin eruption 09/15/2017  . Ventral hernia without obstruction or gangrene 08/15/2017  . Skin irritation 07/23/2017  . Chronic venous stasis dermatitis 12/26/2016  . Urinary tract infection without hematuria   . Frequent PVCs   . Hypokalemia   . Housing problems 11/23/2016  . Chronic atrial fibrillation (HCC) 11/01/2016  . Hypotension 10/31/2016  . Special screening for malignant neoplasms, colon 08/12/2016  . Heme positive stool 08/09/2016  . Intertrigo 08/09/2016  . Chronic anticoagulation 04/26/2016  . Chronic venous insufficiency   . Preventative health care 10/05/2014  . Breast mass, left 10/05/2014  . Osteopenia 09/15/2014  . Hypertension 01/04/2014  . Chronic systolic dysfunction of left ventricle 05/05/2013  . At high risk for falls 02/11/2013  . Vitamin D deficiency 07/15/2012  . Sinoatrial node dysfunction (HCC) 05/08/2011  . Pacemaker 08/10/2009  . Allergic rhinitis 05/31/2008  . Morbid obesity (HCC) 02/12/2007  . GASTROESOPHAGEAL REFLUX, NO ESOPHAGITIS 02/12/2007  . Female stress incontinence 02/12/2007  . Seizure disorder (HCC) 02/12/2007  . OSA (obstructive sleep apnea) 02/12/2007    Current Outpatient Medications:  .  amiodarone (PACERONE) 200 MG tablet, Take 1 tablet (200 mg total) by mouth daily., Disp: 28 tablet, Rfl: 6 .  cholecalciferol (VITAMIN D) 1000 units tablet, Take 1 tablet (1,000 Units total) by mouth daily., Disp: 90 tablet, Rfl: 3 .  fluticasone (FLONASE) 50 MCG/ACT nasal spray, Place 2 sprays into both nostrils daily as needed. For congestion., Disp: 16 g, Rfl: 1 .  nystatin cream  (MYCOSTATIN), Apply to affected area 2 times daily for 3 weeks., Disp: 30 g, Rfl: 1 .  omeprazole (PRILOSEC) 20 MG capsule, Take 1 capsule by mouth daily, Disp: 90 capsule, Rfl: 1 .  PHENobarbital (LUMINAL) 32.4 MG tablet, Take 2 tablets (64.8 mg total) by mouth 2 (two) times daily., Disp: 120 tablet, Rfl: 5 .  potassium chloride (K-DUR,KLOR-CON) 10 MEQ tablet, Take 4 tablets (40 mEq total) by mouth 3 (three) times daily. Morning, Noon, and Night, Disp: 336 tablet, Rfl: 2 .  SAVAYSA 60 MG TABS tablet, TAKE 1 TABLET BY MOUTH ONCE DAILY., Disp: 28 tablet, Rfl: 6 .  torsemide (DEMADEX) 20 MG tablet, Take 2 tablets (40 mg total) by mouth 2 (two) times daily. Morning and Noon, Disp: 112 tablet, Rfl: 6 .  triamcinolone ointment (KENALOG) 0.5 %, Apply 1 application topically 3 (three) times daily., Disp: 60 g, Rfl: 0 .  metolazone (ZAROXOLYN) 2.5 MG tablet, Take 1 tablet (2.5 mg total) by mouth as needed (weight gain or shortness of breath). Take as directed (Patient not taking: Reported on 01/07/2018), Disp: 15 tablet, Rfl: 1 .  potassium chloride (K-DUR,KLOR-CON) 10 MEQ tablet, Take 4 tablets (40 mEq total) by mouth 3 (three) times daily. Morning, Noon, and Night (Patient not taking: Reported on 03/03/2018), Disp: 336 tablet, Rfl: 2 .  potassium chloride (K-DUR,KLOR-CON) 10 MEQ tablet, Take 4 tablets (40 mEq total) by mouth 3 (three) times daily. Morning, Noon, and Night (Patient not taking: Reported on 03/03/2018), Disp: 360 tablet, Rfl: 6 Allergies  Allergen Reactions  . Aspirin Hives and Rash  . Penicillins Rash and Other (See  Comments)    Has patient had a PCN reaction causing immediate rash, facial/tongue/throat swelling, SOB or lightheadedness with hypotension: Yes Has patient had a PCN reaction causing severe rash involving mucus membranes or skin necrosis: No Has patient had a PCN reaction that required hospitalization No Has patient had a PCN reaction occurring within the last 10 years: Yes If all  of the above answers are "NO", then may proceed with Cephalosporin use.   . Tape Other (See Comments)    NO PAPER TAPE-MUST BE MEDICAL TAPE - unknown reaction  . Wool Alcohol [Lanolin] Hives  . Amoxicillin Rash  . Ampicillin Itching and Rash      Social History   Socioeconomic History  . Marital status: Married    Spouse name: [name]  . Number of children: 4  . Years of education: 48  . Highest education level: Not on file  Occupational History  . Occupation: disabled  Social Needs  . Financial resource strain: Not on file  . Food insecurity:    Worry: Not on file    Inability: Not on file  . Transportation needs:    Medical: Not on file    Non-medical: Not on file  Tobacco Use  . Smoking status: Former Smoker    Packs/day: 1.00    Years: 7.00    Pack years: 7.00    Types: Cigarettes    Last attempt to quit: 03/16/1969    Years since quitting: 49.0  . Smokeless tobacco: Never Used  Substance and Sexual Activity  . Alcohol use: No  . Drug use: No  . Sexual activity: Not Currently    Birth control/protection: Post-menopausal  Lifestyle  . Physical activity:    Days per week: Not on file    Minutes per session: Not on file  . Stress: Not on file  Relationships  . Social connections:    Talks on phone: Not on file    Gets together: Not on file    Attends religious service: Not on file    Active member of club or organization: Not on file    Attends meetings of clubs or organizations: Not on file    Relationship status: Not on file  . Intimate partner violence:    Fear of current or ex partner: Not on file    Emotionally abused: Not on file    Physically abused: Not on file    Forced sexual activity: Not on file  Other Topics Concern  . Not on file  Social History Narrative   Lives with husband [name] lives with them as do his 2 children   Daughter, [name], and her 3 children live in [place] -  [name] is incarcerated    Nephew, Madason Rauls lives with them   Daughter, [name], in [name] [address]   Moved June 2013 to a better home on [facility].      Lives with husband, son, grandson, granddaughter, nephew.    Physical Exam      Future Appointments  Date Time Provider Department Center  04/30/2018  9:20 AM [facility] DEVICE REMOTES [name]    BP 118/62   Pulse 62   Wt 177 lb (80.3 kg)   SpO2 97%   BMI 32.37 kg/m   Weight yesterday-177  Last visit weight-176  Pt reports that she is feeling good, she ate a hotdog at lunch today--I cautioned her against these foods  that contain a lot of sodium and not be surprised if her weight is increased tomor morning. She denies any increased sob. meds verified and pill box refilled. She will soon be switching to [employer] for the bubble packs at next refill time. No swelling noted today, lungs clear.   Kerry Hough, EMT-Paramedic [phone] [facility] Paramedic  03/10/18

## 2018-03-11 ENCOUNTER — Other Ambulatory Visit (HOSPITAL_COMMUNITY): Payer: Self-pay | Admitting: Internal Medicine

## 2018-03-11 ENCOUNTER — Other Ambulatory Visit: Payer: Self-pay | Admitting: Family Medicine

## 2018-03-12 MED ORDER — PHENOBARBITAL 32.4 MG PO TABS: 64.8000 mg | ORAL_TABLET | Freq: Two times a day (BID) | ORAL | 1 refills | Status: DC

## 2018-03-16 ENCOUNTER — Other Ambulatory Visit: Payer: Self-pay | Admitting: Internal Medicine

## 2018-03-16 DIAGNOSIS — G4733 Obstructive sleep apnea (adult) (pediatric): Secondary | ICD-10-CM | POA: Diagnosis not present

## 2018-03-17 ENCOUNTER — Other Ambulatory Visit (HOSPITAL_COMMUNITY): Payer: Self-pay

## 2018-03-17 NOTE — Progress Notes (Signed)
Paramedicine Encounter    Patient ID: Misty Gray, female    DOB: [birth year], 67 y.o.   MRN: [id number]   Patient Care Team: Latrelle Dodrill, MD as PCP - General (Family Medicine) [name], MD (Cardiology)  Patient Active Problem List   Diagnosis Date Noted  . Abdominal wall hernia 11/05/2017  . Anticoagulated 11/05/2017  . Rash and nonspecific skin eruption 09/15/2017  . Ventral hernia without obstruction or gangrene 08/15/2017  . Skin irritation 07/23/2017  . Chronic venous stasis dermatitis 12/26/2016  . Urinary tract infection without hematuria   . Frequent PVCs   . Hypokalemia   . Housing problems 11/23/2016  . Chronic atrial fibrillation (HCC) 11/01/2016  . Hypotension 10/31/2016  . Special screening for malignant neoplasms, colon 08/12/2016  . Heme positive stool 08/09/2016  . Intertrigo 08/09/2016  . Chronic anticoagulation 04/26/2016  . Chronic venous insufficiency   . Preventative health care 10/05/2014  . Breast mass, left 10/05/2014  . Osteopenia 09/15/2014  . Hypertension 01/04/2014  . Chronic systolic dysfunction of left ventricle 05/05/2013  . At high risk for falls 02/11/2013  . Vitamin D deficiency 07/15/2012  . Sinoatrial node dysfunction (HCC) 05/08/2011  . Pacemaker 08/10/2009  . Allergic rhinitis 05/31/2008  . Morbid obesity (HCC) 02/12/2007  . GASTROESOPHAGEAL REFLUX, NO ESOPHAGITIS 02/12/2007  . Female stress incontinence 02/12/2007  . Seizure disorder (HCC) 02/12/2007  . OSA (obstructive sleep apnea) 02/12/2007    Current Outpatient Medications:  .  amiodarone (PACERONE) 200 MG tablet, TAKE 1 TABLET BY MOUTH ONCE DAILY., Disp: 30 tablet, Rfl: 3 .  cholecalciferol (VITAMIN D) 1000 units tablet, Take 1 tablet (1,000 Units total) by mouth daily., Disp: 90 tablet, Rfl: 3 .  fluticasone (FLONASE) 50 MCG/ACT nasal spray, Place 2 sprays into both nostrils daily as needed. For congestion., Disp: 16 g, Rfl: 1 .  nystatin cream (MYCOSTATIN),  Apply to affected area 2 times daily for 3 weeks., Disp: 30 g, Rfl: 1 .  omeprazole (PRILOSEC) 20 MG capsule, Take 1 capsule by mouth daily, Disp: 90 capsule, Rfl: 1 .  PHENobarbital (LUMINAL) 32.4 MG tablet, Take 2 tablets (64.8 mg total) by mouth 2 (two) times daily., Disp: 120 tablet, Rfl: 1 .  potassium chloride (K-DUR,KLOR-CON) 10 MEQ tablet, Take 4 tablets (40 mEq total) by mouth 3 (three) times daily. Morning, Noon, and Night, Disp: 336 tablet, Rfl: 2 .  SAVAYSA 60 MG TABS tablet, TAKE 1 TABLET BY MOUTH ONCE DAILY., Disp: 28 tablet, Rfl: 6 .  torsemide (DEMADEX) 20 MG tablet, Take 2 tablets (40 mg total) by mouth 2 (two) times daily. Morning and Noon, Disp: 112 tablet, Rfl: 6 .  triamcinolone ointment (KENALOG) 0.5 %, Apply 1 application topically 3 (three) times daily., Disp: 60 g, Rfl: 0 .  metolazone (ZAROXOLYN) 2.5 MG tablet, Take 1 tablet (2.5 mg total) by mouth as needed (weight gain or shortness of breath). Take as directed (Patient not taking: Reported on 01/07/2018), Disp: 15 tablet, Rfl: 1 .  potassium chloride (K-DUR,KLOR-CON) 10 MEQ tablet, Take 4 tablets (40 mEq total) by mouth 3 (three) times daily. Morning, Noon, and Night (Patient not taking: Reported on 03/03/2018), Disp: 336 tablet, Rfl: 2 .  potassium chloride (K-DUR,KLOR-CON) 10 MEQ tablet, Take 4 tablets (40 mEq total) by mouth 3 (three) times daily. Morning, Noon, and Night (Patient not taking: Reported on 03/03/2018), Disp: 360 tablet, Rfl: 6 Allergies  Allergen Reactions  . Aspirin Hives and Rash  . Penicillins Rash and Other (See Comments)  Has patient had a PCN reaction causing immediate rash, facial/tongue/throat swelling, SOB or lightheadedness with hypotension: Yes Has patient had a PCN reaction causing severe rash involving mucus membranes or skin necrosis: No Has patient had a PCN reaction that required hospitalization No Has patient had a PCN reaction occurring within the last 10 years: Yes If all of the above  answers are "NO", then may proceed with Cephalosporin use.   . Tape Other (See Comments)    NO PAPER TAPE-MUST BE MEDICAL TAPE - unknown reaction  . Wool Alcohol [Lanolin] Hives  . Amoxicillin Rash  . Ampicillin Itching and Rash      Social History   Socioeconomic History  . Marital status: Married    Spouse name: [name]  . Number of children: 4  . Years of education: 66  . Highest education level: Not on file  Occupational History  . Occupation: disabled  Social Needs  . Financial resource strain: Not on file  . Food insecurity:    Worry: Not on file    Inability: Not on file  . Transportation needs:    Medical: Not on file    Non-medical: Not on file  Tobacco Use  . Smoking status: Former Smoker    Packs/day: 1.00    Years: 7.00    Pack years: 7.00    Types: Cigarettes    Last attempt to quit: 03/16/1969    Years since quitting: 49.0  . Smokeless tobacco: Never Used  Substance and Sexual Activity  . Alcohol use: No  . Drug use: No  . Sexual activity: Not Currently    Birth control/protection: Post-menopausal  Lifestyle  . Physical activity:    Days per week: Not on file    Minutes per session: Not on file  . Stress: Not on file  Relationships  . Social connections:    Talks on phone: Not on file    Gets together: Not on file    Attends religious service: Not on file    Active member of club or organization: Not on file    Attends meetings of clubs or organizations: Not on file    Relationship status: Not on file  . Intimate partner violence:    Fear of current or ex partner: Not on file    Emotionally abused: Not on file    Physically abused: Not on file    Forced sexual activity: Not on file  Other Topics Concern  . Not on file  Social History Narrative   Lives with husband [name] lives with them as do his 2 children   Daughter, [name], and her 3 children live in [place] -  [name] is incarcerated   Nephew, Anistynn Nishio lives with them   Daughter, [name], in [name] [address]   Moved June 2013 to a better home on [facility].      Lives with husband, son, grandson, granddaughter, nephew.    Physical Exam      Future Appointments  Date Time Provider Department Center  04/30/2018  9:20 AM [facility]ICE [name]    BP (!) 134/58   Pulse 72   Resp 15   Wt 175 lb (79.4 kg)   SpO2 95%   BMI 32.01 kg/m   Weight yesterday-172 Last visit weight-177  Pt reports she is feeling good, her weight is up 3 lbs overnight, she reports she is able to  take an additional torsemide as needed--she last took extra torsemide last Wednesday--she denies any sob, she ate half can of chili beans last night. She states she will take an extra torsemide. She drinks more than 2L of fluid at times.  She is being changed to [employer] next week. meds verified and her pill box filled.  She is going to call family medicine to sch that walk test.   Kerry Hough, EMT-Paramedic [phone] [facility] Paramedic  03/17/18

## 2018-03-19 ENCOUNTER — Other Ambulatory Visit: Payer: Self-pay

## 2018-03-20 DIAGNOSIS — R0902 Hypoxemia: Secondary | ICD-10-CM | POA: Diagnosis not present

## 2018-03-20 DIAGNOSIS — G4733 Obstructive sleep apnea (adult) (pediatric): Secondary | ICD-10-CM | POA: Diagnosis not present

## 2018-03-20 MED ORDER — VITAMIN D 1000 UNITS PO TABS: 1000.0000 [IU] | ORAL_TABLET | Freq: Every day | ORAL | 3 refills | Status: DC

## 2018-03-24 ENCOUNTER — Other Ambulatory Visit (HOSPITAL_COMMUNITY): Payer: Self-pay

## 2018-03-24 NOTE — Progress Notes (Addendum)
Paramedicine Encounter    Patient ID: Misty Gray, female    DOB: 07-02-1951, 67 y.o.   MRN: [id number]   Patient Care Team: Latrelle Dodrill, MD as PCP - General (Family Medicine) [name], MD (Cardiology)  Patient Active Problem List   Diagnosis Date Noted  . Abdominal wall hernia 11/05/2017  . Anticoagulated 11/05/2017  . Rash and nonspecific skin eruption 09/15/2017  . Ventral hernia without obstruction or gangrene 08/15/2017  . Skin irritation 07/23/2017  . Chronic venous stasis dermatitis 12/26/2016  . Urinary tract infection without hematuria   . Frequent PVCs   . Hypokalemia   . Housing problems 11/23/2016  . Chronic atrial fibrillation (HCC) 11/01/2016  . Hypotension 10/31/2016  . Special screening for malignant neoplasms, colon 08/12/2016  . Heme positive stool 08/09/2016  . Intertrigo 08/09/2016  . Chronic anticoagulation 04/26/2016  . Chronic venous insufficiency   . Preventative health care 10/05/2014  . Breast mass, left 10/05/2014  . Osteopenia 09/15/2014  . Hypertension 01/04/2014  . Chronic systolic dysfunction of left ventricle 05/05/2013  . At high risk for falls 02/11/2013  . Vitamin D deficiency 07/15/2012  . Sinoatrial node dysfunction (HCC) 05/08/2011  . Pacemaker 08/10/2009  . Allergic rhinitis 05/31/2008  . Morbid obesity (HCC) 02/12/2007  . GASTROESOPHAGEAL REFLUX, NO ESOPHAGITIS 02/12/2007  . Female stress incontinence 02/12/2007  . Seizure disorder (HCC) 02/12/2007  . OSA (obstructive sleep apnea) 02/12/2007    Current Outpatient Medications:  .  amiodarone (PACERONE) 200 MG tablet, TAKE 1 TABLET BY MOUTH ONCE DAILY., Disp: 30 tablet, Rfl: 3 .  cholecalciferol (VITAMIN D) 1000 units tablet, Take 1 tablet (1,000 Units total) by mouth daily., Disp: 90 tablet, Rfl: 3 .  fluticasone (FLONASE) 50 MCG/ACT nasal spray, Place 2 sprays into both nostrils daily as needed. For congestion., Disp: 16 g, Rfl: 1 .  nystatin cream (MYCOSTATIN),  Apply to affected area 2 times daily for 3 weeks., Disp: 30 g, Rfl: 1 .  omeprazole (PRILOSEC) 20 MG capsule, TAKE (1) CAPSULE BY MOUTH ONCE DAILY., Disp: 30 capsule, Rfl: 1 .  PHENobarbital (LUMINAL) 32.4 MG tablet, Take 2 tablets (64.8 mg total) by mouth 2 (two) times daily., Disp: 120 tablet, Rfl: 1 .  potassium chloride (K-DUR,KLOR-CON) 10 MEQ tablet, Take 4 tablets (40 mEq total) by mouth 3 (three) times daily. Morning, Noon, and Night, Disp: 336 tablet, Rfl: 2 .  SAVAYSA 60 MG TABS tablet, TAKE 1 TABLET BY MOUTH ONCE DAILY., Disp: 28 tablet, Rfl: 6 .  torsemide (DEMADEX) 20 MG tablet, Take 2 tablets (40 mg total) by mouth 2 (two) times daily. Morning and Noon, Disp: 112 tablet, Rfl: 6 .  triamcinolone ointment (KENALOG) 0.5 %, Apply 1 application topically 3 (three) times daily., Disp: 60 g, Rfl: 0 .  metolazone (ZAROXOLYN) 2.5 MG tablet, Take 1 tablet (2.5 mg total) by mouth as needed (weight gain or shortness of breath). Take as directed (Patient not taking: Reported on 01/07/2018), Disp: 15 tablet, Rfl: 1 .  potassium chloride (K-DUR,KLOR-CON) 10 MEQ tablet, Take 4 tablets (40 mEq total) by mouth 3 (three) times daily. Morning, Noon, and Night (Patient not taking: Reported on 03/03/2018), Disp: 336 tablet, Rfl: 2 .  potassium chloride (K-DUR,KLOR-CON) 10 MEQ tablet, Take 4 tablets (40 mEq total) by mouth 3 (three) times daily. Morning, Noon, and Night (Patient not taking: Reported on 03/03/2018), Disp: 360 tablet, Rfl: 6 Allergies  Allergen Reactions  . Aspirin Hives and Rash  . Penicillins Rash and Other (See Comments)  Has patient had a PCN reaction causing immediate rash, facial/tongue/throat swelling, SOB or lightheadedness with hypotension: Yes Has patient had a PCN reaction causing severe rash involving mucus membranes or skin necrosis: No Has patient had a PCN reaction that required hospitalization No Has patient had a PCN reaction occurring within the last 10 years: Yes If all of  the above answers are "NO", then may proceed with Cephalosporin use.   . Tape Other (See Comments)    NO PAPER TAPE-MUST BE MEDICAL TAPE - unknown reaction  . Wool Alcohol [Lanolin] Hives  . Amoxicillin Rash  . Ampicillin Itching and Rash      Social History   Socioeconomic History  . Marital status: Married    Spouse name: [name]  . Number of children: 4  . Years of education: 62  . Highest education level: Not on file  Occupational History  . Occupation: disabled  Social Needs  . Financial resource strain: Not on file  . Food insecurity:    Worry: Not on file    Inability: Not on file  . Transportation needs:    Medical: Not on file    Non-medical: Not on file  Tobacco Use  . Smoking status: Former Smoker    Packs/day: 1.00    Years: 7.00    Pack years: 7.00    Types: Cigarettes    Last attempt to quit: 03/16/1969    Years since quitting: 49.0  . Smokeless tobacco: Never Used  Substance and Sexual Activity  . Alcohol use: No  . Drug use: No  . Sexual activity: Not Currently    Birth control/protection: Post-menopausal  Lifestyle  . Physical activity:    Days per week: Not on file    Minutes per session: Not on file  . Stress: Not on file  Relationships  . Social connections:    Talks on phone: Not on file    Gets together: Not on file    Attends religious service: Not on file    Active member of club or organization: Not on file    Attends meetings of clubs or organizations: Not on file    Relationship status: Not on file  . Intimate partner violence:    Fear of current or ex partner: Not on file    Emotionally abused: Not on file    Physically abused: Not on file    Forced sexual activity: Not on file  Other Topics Concern  . Not on file  Social History Narrative   Lives with husband [name] lives with them as do his 2 children   Daughter, [name], and her 3 children live in [place] -  [name] is incarcerated    Nephew, Heloise Gordan lives with them   Daughter, [name], in [name] [address]   Moved June 2013 to a better home on [facility].      Lives with husband, son, grandson, granddaughter, nephew.    Physical Exam      Future Appointments  Date Time Provider Department Center  03/31/2018 10:50 AM Latrelle Dodrill, MD [facility]  04/30/2018  9:20 AM [facility] DEVICE REMOTES [name]    BP 110/70   Pulse 62   Resp 14   Wt 175 lb (79.4 kg)   SpO2 97%   BMI 32.01 kg/m   Weight yesterday-174 Last visit weight-175  Pt has 3 more days left for her pill box  and then she will be with [employer]. Spoke with pharmacy and they will be here today between 12-2. She has not taken her meds yet this morning. She injured her left leg the other day, didn't realize she had hit, dried scabs on it today but it is reddened and I advised her to see her PCP if it doesn't get better. Her abd appears larger she has the hernia issues, she is seeing her doc at [facility] again in may to f/u for that.  Pt denies any increased sob, no dizziness, no swelling noted. Advised her to contact me if she needs anything or notices any issues with her bubble packing.   Kerry Hough, EMT-Paramedic [phone] [facility] Paramedic  03/24/18

## 2018-03-31 ENCOUNTER — Other Ambulatory Visit (HOSPITAL_COMMUNITY): Payer: Self-pay

## 2018-03-31 ENCOUNTER — Ambulatory Visit (INDEPENDENT_AMBULATORY_CARE_PROVIDER_SITE_OTHER): Payer: Medicare Other | Admitting: Family Medicine

## 2018-03-31 ENCOUNTER — Encounter: Payer: Self-pay | Admitting: Family Medicine

## 2018-03-31 VITALS — BP 108/70 | HR 65 | Temp 98.2°F | Wt 179.6 lb

## 2018-03-31 DIAGNOSIS — Z1231 Encounter for screening mammogram for malignant neoplasm of breast: Secondary | ICD-10-CM | POA: Diagnosis not present

## 2018-03-31 DIAGNOSIS — Z1211 Encounter for screening for malignant neoplasm of colon: Secondary | ICD-10-CM

## 2018-03-31 DIAGNOSIS — G4733 Obstructive sleep apnea (adult) (pediatric): Secondary | ICD-10-CM

## 2018-03-31 DIAGNOSIS — Z1239 Encounter for other screening for malignant neoplasm of breast: Secondary | ICD-10-CM

## 2018-03-31 DIAGNOSIS — Z13220 Encounter for screening for lipoid disorders: Secondary | ICD-10-CM

## 2018-03-31 DIAGNOSIS — R238 Other skin changes: Secondary | ICD-10-CM | POA: Diagnosis not present

## 2018-03-31 NOTE — Progress Notes (Addendum)
Date of Visit: 03/31/2018   HPI:  Patient presents for routine follow up and oxygen testing.  O2/OSA- previously was on 2 L of oxygen at night. Had recent sleep study which reocmmended CPAP, which she has been using though not every night. States her mask does not fit very well. Sleep study recommendations did not include supplemental oxygen at night. Has not worn oxygen in some time but would like it if she qualifies for it. Having trouble with her electricity going out at her apartment while using CPAP and any other device in outlets in her bedroom.  Wound on her stomach is healing up better.  Has history of CHF and is followed by CHF team here at [facility] regularly for this and her chronic afib.   ROS: See HPI.  PMFSH: history of systolic CHF, chronic anticoagulation, chronic afib, chronic venous stasis dermatitis, PVCs, hypertension, morbid obesity, OSA, GERD, seizure disorder, osteopenia  PHYSICAL EXAM: BP 108/70 (BP Location: Left Arm, Patient Position: Sitting, Cuff Size: Normal)   Pulse 65   Temp 98.2 F (36.8 C) (Oral)   Wt 179 lb 9.6 oz (81.5 kg)   SpO2 96%   BMI 32.85 kg/m  Gen: no acute distress, pleasant, cooperative, well appearing HEENT: normocephalic, atraumatic, moist mucous membranes  Heart: regular rate and rhythm, no murmur Lungs: clear to auscultation bilaterally, normal work of breathing  Neuro: alert, grossly nonfocal, speech normal Ext: No appreciable lower extremity edema bilaterally  Skin: healing area of ulceration on abdomen from prior abdominal surgery scar  ASSESSMENT/PLAN:  Health maintenance:  -given stool cards today -mammogram ordered & handout given on how to schedule -check lipids, CMET to screen for hyperlipidemia (not on statin at present)  OSA (obstructive sleep apnea) Encouraged compliance with CPAP nightly. Patient did not desaturate below 90% while ambulating on room air, no indication for supplemental O2. Sleep study did not recommend  O2 supplementation other than usual CPAP positive pressure. Patient also encouraged to contact CPAP vendor about getting a mask that fits her correctly. Will write letter asking her landlord correct electrical issues so she can safely and consistently use CPAP at night.  Skin irritation Healing, though suspect it is chronically irritated. Continue to monitor. No plans for biopsy at this point.  FOLLOW UP: Follow up in 3 months with me for routine medical problems  [place] J. Pollie Meyer, MD [facility]

## 2018-03-31 NOTE — Patient Instructions (Signed)
Your oxygen levels were fine when walking.  See the handout on how to schedule your mammogram. This is an important test to screen for breast cancer.  Bring back the stool cards  Checking cholesterol, kidneys, liver today.  Follow up with me in 3 months, sooner if needed.  Be well, Dr. Pollie Meyer

## 2018-03-31 NOTE — Progress Notes (Signed)
Paramedicine Encounter    Patient ID: Misty Gray, female    DOB: 1951-01-30, 67 y.o.   MRN: [id number]   Patient Care Team: Latrelle Dodrill, MD as PCP - General (Family Medicine) [name], MD (Cardiology)  Patient Active Problem List   Diagnosis Date Noted  . Abdominal wall hernia 11/05/2017  . Anticoagulated 11/05/2017  . Rash and nonspecific skin eruption 09/15/2017  . Ventral hernia without obstruction or gangrene 08/15/2017  . Skin irritation 07/23/2017  . Chronic venous stasis dermatitis 12/26/2016  . Urinary tract infection without hematuria   . Frequent PVCs   . Hypokalemia   . Housing problems 11/23/2016  . Chronic atrial fibrillation (HCC) 11/01/2016  . Hypotension 10/31/2016  . Special screening for malignant neoplasms, colon 08/12/2016  . Heme positive stool 08/09/2016  . Intertrigo 08/09/2016  . Chronic anticoagulation 04/26/2016  . Chronic venous insufficiency   . Preventative health care 10/05/2014  . Breast mass, left 10/05/2014  . Osteopenia 09/15/2014  . Hypertension 01/04/2014  . Chronic systolic dysfunction of left ventricle 05/05/2013  . At high risk for falls 02/11/2013  . Vitamin D deficiency 07/15/2012  . Sinoatrial node dysfunction (HCC) 05/08/2011  . Pacemaker 08/10/2009  . Allergic rhinitis 05/31/2008  . Morbid obesity (HCC) 02/12/2007  . GASTROESOPHAGEAL REFLUX, NO ESOPHAGITIS 02/12/2007  . Female stress incontinence 02/12/2007  . Seizure disorder (HCC) 02/12/2007  . OSA (obstructive sleep apnea) 02/12/2007    Current Outpatient Medications:  .  amiodarone (PACERONE) 200 MG tablet, TAKE 1 TABLET BY MOUTH ONCE DAILY., Disp: 30 tablet, Rfl: 3 .  cholecalciferol (VITAMIN D) 1000 units tablet, Take 1 tablet (1,000 Units total) by mouth daily., Disp: 90 tablet, Rfl: 3 .  fluticasone (FLONASE) 50 MCG/ACT nasal spray, Place 2 sprays into both nostrils daily as needed. For congestion., Disp: 16 g, Rfl: 1 .  nystatin cream (MYCOSTATIN),  Apply to affected area 2 times daily for 3 weeks., Disp: 30 g, Rfl: 1 .  omeprazole (PRILOSEC) 20 MG capsule, TAKE (1) CAPSULE BY MOUTH ONCE DAILY., Disp: 30 capsule, Rfl: 1 .  PHENobarbital (LUMINAL) 32.4 MG tablet, Take 2 tablets (64.8 mg total) by mouth 2 (two) times daily., Disp: 120 tablet, Rfl: 1 .  potassium chloride (K-DUR,KLOR-CON) 10 MEQ tablet, Take 4 tablets (40 mEq total) by mouth 3 (three) times daily. Morning, Noon, and Night, Disp: 336 tablet, Rfl: 2 .  SAVAYSA 60 MG TABS tablet, TAKE 1 TABLET BY MOUTH ONCE DAILY., Disp: 28 tablet, Rfl: 6 .  torsemide (DEMADEX) 20 MG tablet, Take 2 tablets (40 mg total) by mouth 2 (two) times daily. Morning and Noon, Disp: 112 tablet, Rfl: 6 .  triamcinolone ointment (KENALOG) 0.5 %, Apply 1 application topically 3 (three) times daily., Disp: 60 g, Rfl: 0 .  metolazone (ZAROXOLYN) 2.5 MG tablet, Take 1 tablet (2.5 mg total) by mouth as needed (weight gain or shortness of breath). Take as directed (Patient not taking: Reported on 03/31/2018), Disp: 15 tablet, Rfl: 1 Allergies  Allergen Reactions  . Aspirin Hives and Rash  . Penicillins Rash and Other (See Comments)    Has patient had a PCN reaction causing immediate rash, facial/tongue/throat swelling, SOB or lightheadedness with hypotension: Yes Has patient had a PCN reaction causing severe rash involving mucus membranes or skin necrosis: No Has patient had a PCN reaction that required hospitalization No Has patient had a PCN reaction occurring within the last 10 years: Yes If all of the above answers are "NO", then may proceed  with Cephalosporin use.   . Tape Other (See Comments)    NO PAPER TAPE-MUST BE MEDICAL TAPE - unknown reaction  . Wool Alcohol [Lanolin] Hives  . Amoxicillin Rash  . Ampicillin Itching and Rash      Social History   Socioeconomic History  . Marital status: Married    Spouse name: [name]  . Number of children: 4  . Years of education: 88  . Highest education  level: Not on file  Occupational History  . Occupation: disabled  Social Needs  . Financial resource strain: Not on file  . Food insecurity:    Worry: Not on file    Inability: Not on file  . Transportation needs:    Medical: Not on file    Non-medical: Not on file  Tobacco Use  . Smoking status: Former Smoker    Packs/day: 1.00    Years: 7.00    Pack years: 7.00    Types: Cigarettes    Last attempt to quit: 03/16/1969    Years since quitting: 49.0  . Smokeless tobacco: Never Used  Substance and Sexual Activity  . Alcohol use: No  . Drug use: No  . Sexual activity: Not Currently    Birth control/protection: Post-menopausal  Lifestyle  . Physical activity:    Days per week: Not on file    Minutes per session: Not on file  . Stress: Not on file  Relationships  . Social connections:    Talks on phone: Not on file    Gets together: Not on file    Attends religious service: Not on file    Active member of club or organization: Not on file    Attends meetings of clubs or organizations: Not on file    Relationship status: Not on file  . Intimate partner violence:    Fear of current or ex partner: Not on file    Emotionally abused: Not on file    Physically abused: Not on file    Forced sexual activity: Not on file  Other Topics Concern  . Not on file  Social History Narrative   Lives with husband [name] lives with them as do his 2 children   Daughter, [name], and her 3 children live in [place] -  [name] is incarcerated   Nephew, Chihiro Frey lives with them   Daughter, [name], in [name] [address]   Moved June 2013 to a better home on [facility].      Lives with husband, son, grandson, granddaughter, nephew.    Physical Exam      Future Appointments  Date Time Provider Department Center  04/30/2018  9:20 AM [facility] DEVICE REMOTES [name]    BP 118/62   Pulse 62   Resp 15    Wt 175 lb (79.4 kg)   SpO2 97%   BMI 32.01 kg/m   Weight yesterday-175 Last visit weight-175  Came by today for med check-pharmacy delivered them to her last week. They are correct however the torsemide is placed in pm rather than afternoon--she does not have the vit d, her PCP visit was today and I called the pharmacy and they do have the order for her to get the vit d and he is doing to send out the bottle of it for a partial fill and she can take it from that and then for the next month the pharmacy will place it  in pill box and we got the torsemide will be placed in the noon time slot next time too. Pt has been removing it from the bedtime dose and taking it with the noon time anyways. Will see her in a month to verify pill packs, unless otherwise needed before then.   Kerry Hough, EMT-Paramedic [phone] [facility] Paramedic  03/31/18

## 2018-04-01 LAB — CMP14+EGFR
A/G RATIO: 1.2 (ref 1.2–2.2)
ALT: 12 IU/L (ref 0–32)
AST: 22 IU/L (ref 0–40)
Albumin: 4.1 g/dL (ref 3.6–4.8)
Alkaline Phosphatase: 144 IU/L — ABNORMAL HIGH (ref 39–117)
BUN/Creatinine Ratio: 30 — ABNORMAL HIGH (ref 12–28)
BUN: 29 mg/dL — ABNORMAL HIGH (ref 8–27)
Bilirubin Total: 0.5 mg/dL (ref 0.0–1.2)
CALCIUM: 9.8 mg/dL (ref 8.7–10.3)
CO2: 28 mmol/L (ref 20–29)
CREATININE: 0.96 mg/dL (ref 0.57–1.00)
Chloride: 101 mmol/L (ref 96–106)
GFR, EST AFRICAN AMERICAN: 70 mL/min/{1.73_m2} (ref >59)
GFR, EST NON AFRICAN AMERICAN: 61 mL/min/{1.73_m2} (ref >59)
GLUCOSE: 92 mg/dL (ref 65–99)
Globulin, Total: 3.5 g/dL (ref 1.5–4.5)
Potassium: 4.6 mmol/L (ref 3.5–5.2)
Sodium: 142 mmol/L (ref 134–144)
TOTAL PROTEIN: 7.6 g/dL (ref 6.0–8.5)

## 2018-04-01 LAB — LIPID PANEL
Chol/HDL Ratio: 2.2 ratio (ref 0.0–4.4)
Cholesterol, Total: 177 mg/dL (ref 100–199)
HDL: 80 mg/dL (ref >39)
LDL CALC: 84 mg/dL (ref 0–99)
Triglycerides: 64 mg/dL (ref 0–149)
VLDL CHOLESTEROL CAL: 13 mg/dL (ref 5–40)

## 2018-04-06 NOTE — Assessment & Plan Note (Addendum)
Encouraged compliance with CPAP nightly. Patient did not desaturate below 90% while ambulating on room air, no indication for supplemental O2. Sleep study did not recommend O2 supplementation other than usual CPAP positive pressure. Patient also encouraged to contact CPAP vendor about getting a mask that fits her correctly. Will write letter asking her landlord correct electrical issues so she can safely and consistently use CPAP at night.

## 2018-04-06 NOTE — Assessment & Plan Note (Signed)
Healing, though suspect it is chronically irritated. Continue to monitor. No plans for biopsy at this point.

## 2018-04-09 ENCOUNTER — Encounter: Payer: Self-pay | Admitting: Family Medicine

## 2018-04-14 ENCOUNTER — Encounter (HOSPITAL_COMMUNITY): Payer: Self-pay

## 2018-04-14 ENCOUNTER — Emergency Department (HOSPITAL_COMMUNITY): Payer: Medicare Other

## 2018-04-14 ENCOUNTER — Emergency Department (HOSPITAL_COMMUNITY)
Admission: EM | Admit: 2018-04-14 | Discharge: 2018-04-15 | Disposition: A | Discharge status: Home / Self Care | Payer: Medicare Other | Attending: Emergency Medicine | Admitting: Emergency Medicine

## 2018-04-14 ENCOUNTER — Other Ambulatory Visit: Payer: Self-pay

## 2018-04-14 DIAGNOSIS — L03311 Cellulitis of abdominal wall: Secondary | ICD-10-CM | POA: Diagnosis not present

## 2018-04-14 DIAGNOSIS — Z79899 Other long term (current) drug therapy: Secondary | ICD-10-CM | POA: Diagnosis not present

## 2018-04-14 DIAGNOSIS — Z87891 Personal history of nicotine dependence: Secondary | ICD-10-CM | POA: Insufficient documentation

## 2018-04-14 DIAGNOSIS — R1012 Left upper quadrant pain: Secondary | ICD-10-CM | POA: Diagnosis not present

## 2018-04-14 DIAGNOSIS — I110 Hypertensive heart disease with heart failure: Secondary | ICD-10-CM | POA: Diagnosis not present

## 2018-04-14 DIAGNOSIS — Z950 Presence of cardiac pacemaker: Secondary | ICD-10-CM | POA: Insufficient documentation

## 2018-04-14 DIAGNOSIS — R109 Unspecified abdominal pain: Secondary | ICD-10-CM

## 2018-04-14 DIAGNOSIS — R1011 Right upper quadrant pain: Secondary | ICD-10-CM | POA: Diagnosis not present

## 2018-04-14 DIAGNOSIS — I5022 Chronic systolic (congestive) heart failure: Secondary | ICD-10-CM | POA: Insufficient documentation

## 2018-04-14 LAB — URINALYSIS, ROUTINE W REFLEX MICROSCOPIC
BILIRUBIN URINE: NEGATIVE
Bacteria, UA: NONE SEEN
GLUCOSE, UA: NEGATIVE mg/dL
Ketones, ur: NEGATIVE mg/dL
LEUKOCYTES UA: NEGATIVE
NITRITE: NEGATIVE
PROTEIN: NEGATIVE mg/dL
SPECIFIC GRAVITY, URINE: 1.005 (ref 1.005–1.030)
pH: 5 (ref 5.0–8.0)

## 2018-04-14 LAB — CBC WITH DIFFERENTIAL/PLATELET
BASOS ABS: 0 10*3/uL (ref 0.0–0.1)
BASOS PCT: 0 %
EOS ABS: 0.2 10*3/uL (ref 0.0–0.7)
Eosinophils Relative: 3 %
HCT: 41.8 % (ref 36.0–46.0)
HEMOGLOBIN: 13.8 g/dL (ref 12.0–15.0)
Lymphocytes Relative: 32 %
Lymphs Abs: 1.7 10*3/uL (ref 0.7–4.0)
MCH: 32.6 pg (ref 26.0–34.0)
MCHC: 33 g/dL (ref 30.0–36.0)
MCV: 98.8 fL (ref 78.0–100.0)
Monocytes Absolute: 0.5 10*3/uL (ref 0.1–1.0)
Monocytes Relative: 10 %
NEUTROS ABS: 2.9 10*3/uL (ref 1.7–7.7)
NEUTROS PCT: 55 %
Platelets: 110 10*3/uL — ABNORMAL LOW (ref 150–400)
RBC: 4.23 MIL/uL (ref 3.87–5.11)
RDW: 14.5 % (ref 11.5–15.5)
WBC: 5.4 10*3/uL (ref 4.0–10.5)

## 2018-04-14 LAB — LIPASE, BLOOD: LIPASE: 27 U/L (ref 11–51)

## 2018-04-14 LAB — COMPREHENSIVE METABOLIC PANEL
ALK PHOS: 123 U/L (ref 38–126)
ALT: 13 U/L — ABNORMAL LOW (ref 14–54)
ANION GAP: 7 (ref 5–15)
AST: 21 U/L (ref 15–41)
Albumin: 3.5 g/dL (ref 3.5–5.0)
BILIRUBIN TOTAL: 0.6 mg/dL (ref 0.3–1.2)
BUN: 17 mg/dL (ref 6–20)
CALCIUM: 9.3 mg/dL (ref 8.9–10.3)
CO2: 30 mmol/L (ref 22–32)
Chloride: 102 mmol/L (ref 101–111)
Creatinine, Ser: 0.83 mg/dL (ref 0.44–1.00)
Glucose, Bld: 89 mg/dL (ref 65–99)
POTASSIUM: 3.5 mmol/L (ref 3.5–5.1)
Sodium: 139 mmol/L (ref 135–145)
TOTAL PROTEIN: 7.3 g/dL (ref 6.5–8.1)

## 2018-04-14 MED ORDER — IOHEXOL 300 MG/ML  SOLN
100.0000 mL | Freq: Once | INTRAMUSCULAR | Status: AC | PRN
  Administered 2018-04-14: 100 mL via INTRAVENOUS

## 2018-04-14 NOTE — ED Provider Notes (Signed)
Patient placed in Quick Look pathway, seen and evaluated   Chief Complaint: Abdominal Pain  HPI:   67 y.o. female with past medical history of GERD, hiatal hernia, abdominal incisional hernia who presents for evaluation of abdominal pain and wound.  Patient reports she has been having intermittent abdominal pain for last 3 to 4 weeks it appears grossly worsened over the last week.  Patient reports that over the last few days, pain is localized more to the right upper quadrant.  Patient reports that when the pain comes, she feels like she cannot hardly take a breath.  Patient states she has been able to eat and drink and states that the pain is not affected by eating or drinking.  Patient denies any other alleviating or aggravating factors.  Additionally, patient reports that she started having some drainage to a wound at the distal end of a midline incision site.  Patient reports that she had that incision initially in the 1980s.  Patient reports that 2 days ago, the area started becoming red, warm and started having purulent drainage.  Patient denies any fevers, chest pain, difficulty breathing, vomiting.  ROS:  Abdominal Pain  Physical Exam:   Gen: No distress  Neuro: Awake and Alert  Skin: Warm    Focused Exam: Tenderness palpation noted to the right upper quadrant.  Patient has a midline incision scar with an area of warmth, erythema, purulent drainage noted to the distal aspect.  Lungs clear to auscultation   Initiation of care has begun. The patient has been counseled on the process, plan, and necessity for staying for the completion/evaluation, and the remainder of the medical screening examination    Rosana Hoes 04/14/18 1859    Dione Booze, MD 04/15/18 [phone]

## 2018-04-14 NOTE — ED Triage Notes (Signed)
Pt reports she has been having right sided abdominal pain. Also has an incision in lower middle abdomen that started draining over the weekend. Pt reports she has surgery years ago but since she has lost weight the area has gotten worse. Pt states she has had a fungal infection in same area.

## 2018-04-15 ENCOUNTER — Other Ambulatory Visit: Payer: Self-pay | Admitting: Family Medicine

## 2018-04-15 DIAGNOSIS — Z1211 Encounter for screening for malignant neoplasm of colon: Secondary | ICD-10-CM | POA: Diagnosis not present

## 2018-04-15 DIAGNOSIS — G4733 Obstructive sleep apnea (adult) (pediatric): Secondary | ICD-10-CM | POA: Diagnosis not present

## 2018-04-15 MED ORDER — DOXYCYCLINE HYCLATE 100 MG PO CAPS: 100.0000 mg | ORAL_CAPSULE | Freq: Two times a day (BID) | ORAL | 0 refills | Status: DC

## 2018-04-15 NOTE — ED Provider Notes (Deleted)
67 year old female comes in with episode of right upper abdominal pain.  Pain waxes and wanes.  Exam is benign with no tenderness and no guarding.  Also, she has had drainage from a surgical wound which is over 66 years old.  This wound has drained periodically.  CT scan shows no acute intra-abdominal pathology and does appear to show a fluid collection in the abdominal wall which had been noted previously.  I suspect that this is a persistent seroma that periodically is draining.  She is reassured of the benign nature of her ED work-up but is referred to general surgery for evaluation whether there should be an exploration and have the fluid definitively drained.  Results for orders placed or performed during the hospital encounter of 04/14/18  Comprehensive metabolic panel  Result Value Ref Range   Sodium 139 135 - 145 mmol/L   Potassium 3.5 3.5 - 5.1 mmol/L   Chloride 102 101 - 111 mmol/L   CO2 30 22 - 32 mmol/L   Glucose, Bld 89 65 - 99 mg/dL   BUN 17 6 - 20 mg/dL   Creatinine, Ser 8.84 0.44 - 1.00 mg/dL   Calcium 9.3 8.9 - 16.6 mg/dL   Total Protein 7.3 6.5 - 8.1 g/dL   Albumin 3.5 3.5 - 5.0 g/dL   AST 21 15 - 41 U/L   ALT 13 (L) 14 - 54 U/L   Alkaline Phosphatase 123 38 - 126 U/L   Total Bilirubin 0.6 0.3 - 1.2 mg/dL   GFR calc non Af Amer >60 >60 mL/min   GFR calc Af Amer >60 >60 mL/min   Anion gap 7 5 - 15  Lipase, blood  Result Value Ref Range   Lipase 27 11 - 51 U/L  CBC with Differential  Result Value Ref Range   WBC 5.4 4.0 - 10.5 K/uL   RBC 4.23 3.87 - 5.11 MIL/uL   Hemoglobin 13.8 12.0 - 15.0 g/dL   HCT 06.3 01.6 - 01.0 %   MCV 98.8 78.0 - 100.0 fL   MCH 32.6 26.0 - 34.0 pg   MCHC 33.0 30.0 - 36.0 g/dL   RDW 93.2 35.5 - 73.2 %   Platelets 110 (L) 150 - 400 K/uL   Neutrophils Relative % 55 %   Neutro Abs 2.9 1.7 - 7.7 K/uL   Lymphocytes Relative 32 %   Lymphs Abs 1.7 0.7 - 4.0 K/uL   Monocytes Relative 10 %   Monocytes Absolute 0.5 0.1 - 1.0 K/uL   Eosinophils  Relative 3 %   Eosinophils Absolute 0.2 0.0 - 0.7 K/uL   Basophils Relative 0 %   Basophils Absolute 0.0 0.0 - 0.1 K/uL  Urinalysis, Routine w reflex microscopic  Result Value Ref Range   Color, Urine STRAW (A) YELLOW   APPearance CLEAR CLEAR   Specific Gravity, Urine 1.005 1.005 - 1.030   pH 5.0 5.0 - 8.0   Glucose, UA NEGATIVE NEGATIVE mg/dL   Hgb urine dipstick SMALL (A) NEGATIVE   Bilirubin Urine NEGATIVE NEGATIVE   Ketones, ur NEGATIVE NEGATIVE mg/dL   Protein, ur NEGATIVE NEGATIVE mg/dL   Nitrite NEGATIVE NEGATIVE   Leukocytes, UA NEGATIVE NEGATIVE   RBC / HPF 0-5 0 - 5 RBC/hpf   WBC, UA 0-5 0 - 5 WBC/hpf   Bacteria, UA NONE SEEN NONE SEEN   Squamous Epithelial / LPF 0-5 0 - 5   Mucus PRESENT    Ct Abdomen Pelvis W Contrast  Result Date: 04/14/2018 CLINICAL DATA:  Right-sided abdominal pain. Open abdominal incision is draining from surgery years ago. EXAM: CT ABDOMEN AND PELVIS WITH CONTRAST TECHNIQUE: Multidetector CT imaging of the abdomen and pelvis was performed using the standard protocol following bolus administration of intravenous contrast. CONTRAST:  OMNIPAQUE IOHEXOL 300 MG/ML  SOLN COMPARISON:  09/02/2017 FINDINGS: Lower chest: Bronchial wall thickening with mild scarring in the lungs likely representing chronic bronchitis. Diffuse cardiac enlargement. Cardiac pacemaker wires. Hepatobiliary: Diffuse fatty infiltration of the liver. No focal liver lesions. Cholelithiasis with multiple stones in the gallbladder. No gallbladder wall thickening or edema. Bile ducts are not dilated. Pancreas: Fatty infiltration of the pancreas. No inflammatory changes. No ductal dilatation. Spleen: Spleen size is mildly enlarged. No focal lesions identified. Adrenals/Urinary Tract: No adrenal gland nodules. Bilateral renal parenchymal scarring. Nephrograms are symmetrical. No hydronephrosis or hydroureter. Bladder is unremarkable. Stomach/Bowel: Stomach is decompressed. Small bowel and  colon are not abnormally distended. Scattered stool throughout the colon. Appendix is not identified. There is a large left inguinal hernia containing fat and sigmoid colon. No proximal obstruction. Vascular/Lymphatic: Aortic atherosclerosis. No enlarged abdominal or pelvic lymph nodes. Reproductive: Uterus and bilateral adnexa are unremarkable. Other: There is scarring in the low anterior abdominal wall with surgical clips, likely postoperative from ventral hernia repair. In the low anterior pelvic wall, there is associated soft tissue thickening with central low-attenuation measuring about 1.1 x 8.7 cm in diameter. This is unchanged since the previous study from 09/02/2017. This is associated with a postoperative changes in the hernia repair. A small chronic fluid collection could have this appearance but is felt to be unlikely due to the lack of interval change. Residual fat herniation inferior to the hernia repair. No free air or free fluid in the abdomen. Musculoskeletal: Degenerative changes in the spine. Degenerative changes in the hips. No destructive bone lesions. IMPRESSION: 1. Postoperative changes from ventral hernia repair with such shaded scarring in the anterior abdominal wall and surgical clips. There is a linear thick-walled collection in the anterior pelvic wall associated with hernia repair. This is likely postoperative. No change since previous study. Residual recurrent fat herniation inferior to the area of repair, also similar to prior study. 2. Large left inguinal hernia containing sigmoid colon without proximal obstruction. 3. Chronic bronchitic changes in the lungs. 4. Cardiac enlargement. 5. Diffuse fatty infiltration of the liver. 6. Cholelithiasis without evidence of cholecystitis. Electronically Signed   By: Burman Nieves M.D.   On: 04/14/2018 23:56   Images viewed by me.  Medical screening examination/treatment/procedure(s) were conducted as a shared visit with non-physician  practitioner(s) and myself.  I personally evaluated the patient during the encounter.     Dione Booze, MD 04/15/18 [phone]

## 2018-04-15 NOTE — Discharge Instructions (Signed)
Your CT scan and blood work today were reassuring.  Given the changes to your prior incision site, we recommend doxycycline as prescribed to cover for infection.  You would benefit from follow-up with your primary care doctor for wound recheck as well as general surgery.  Continue your daily medications as prescribed.  Return to the emergency department, as needed for new or concerning symptoms.

## 2018-04-15 NOTE — ED Provider Notes (Signed)
[facility] EMERGENCY DEPARTMENT Provider Note   CSN: [id number] Arrival date & time: 04/14/18  1757     History   Chief Complaint Chief Complaint  Patient presents with  . Abdominal Pain    HPI Misty Gray is a 67 y.o. female.   67 y.o. female with past medical history of GERD, hiatal hernia, abdominal incisional hernia presents to the emergency department for multiple complaints.  She is primarily fixated on a wound at the site of her prior hernia repair.  Surgery was in the late 1980s.  She reports 2 days of increased redness and warmth.  She has noticed some serosanguineous drainage intermittently, beginning yesterday.  This area initially began as a scab, but has worsened.  She has had some similar issues with her wound site in the past which has resolved with a topical cream.  She denies any associated fevers, purulent drainage, pain at wound site.  Patient also reporting a pain in her right upper abdomen for the past month.  This is intermittent.  Patient previously reporting worsening over the past week.  She has a bowel movement at least every 1 to 2 days and did have a bowel movement yesterday.  She denies any nausea or associated vomiting.  No medications taken for her abdominal pain.  She notices the discomfort "catching my breath" when she moves on occasion.  It is not aggravated by eating or drinking.     Past Medical History:  Diagnosis Date  . Abnormal CT of the head 12/16/1984   R parietal atrophy  . Allergic rhinitis, cause unspecified   . Altered mental status 11/28/2016  . Anemia   . Arthritis    "hands and knees" (09/06/2015)  . Atrial fibrillation (HCC)   . Cellulitis 09/07/2015  . Cellulitis and abscess of leg 09/2016   bilateral  . CHF (congestive heart failure) (HCC)   . Diaphragmatic hernia without mention of obstruction or gangrene   . Dyspnea   . Exertional dyspnea 06/22/12  . Family history of adverse reaction to anesthesia    "daughter fights w/them; just can't relax during OR; stay awake during the OR"  . Fatigue 06/22/2012  . Female stress incontinence   . GERD (gastroesophageal reflux disease)   . Grand mal seizure (HCC)   . H/O hiatal hernia    two removed  . Heart murmur   . High cholesterol   . History of blood transfusion 1956   "related to nose bleed"  . Hypertension   . Influenza A 01/11/2014  . Lichenification and lichen simplex chronicus   . Migraine    "when I was a teenager"  . Morbid obesity (HCC)   . Nonischemic cardiomyopathy (HCC)    EF has normalized-repeat Pending   . On home oxygen therapy    "2L when I'm asleep in bed" (09/06/2015)  . OSA on CPAP   . Pacemaker  st judes    Patient states "this is my third pacemaker"  . Pneumonia 2004; 2011; 11/2011  . Seizures (HCC) since 1981   "can hear you talking but sounds like you are in big tunnel; have them often if not taking RX; last one was 06/17/12" (09/06/2015)  . Shortness of breath 10/24/2010   Qualifier: Diagnosis of  By: [place], Bonnie    . Sick sinus syndrome (HCC)    WITH PRIOR DDD PACEMAKER IMPLANTATION  . Somnolence   . Stroke [facility]) 1988   "mouth drawed real bad on my left  side; found out it was a seizure"  . Syncope and collapse 06/22/12   "hit forehead and left knee"; denies loss of consciousness  . Unspecified venous (peripheral) insufficiency     Patient Active Problem List   Diagnosis Date Noted  . Abdominal wall hernia 11/05/2017  . Anticoagulated 11/05/2017  . Rash and nonspecific skin eruption 09/15/2017  . Ventral hernia without obstruction or gangrene 08/15/2017  . Skin irritation 07/23/2017  . Chronic venous stasis dermatitis 12/26/2016  . Urinary tract infection without hematuria   . Frequent PVCs   . Hypokalemia   . Housing problems 11/23/2016  . Chronic atrial fibrillation (HCC) 11/01/2016  . Hypotension 10/31/2016  . Special screening for malignant neoplasms, colon 08/12/2016  . Heme positive stool  08/09/2016  . Intertrigo 08/09/2016  . Chronic anticoagulation 04/26/2016  . Chronic venous insufficiency   . Preventative health care 10/05/2014  . Breast mass, left 10/05/2014  . Osteopenia 09/15/2014  . Hypertension 01/04/2014  . Chronic systolic dysfunction of left ventricle 05/05/2013  . At high risk for falls 02/11/2013  . Vitamin D deficiency 07/15/2012  . Sinoatrial node dysfunction (HCC) 05/08/2011  . Pacemaker 08/10/2009  . Allergic rhinitis 05/31/2008  . Morbid obesity (HCC) 02/12/2007  . GASTROESOPHAGEAL REFLUX, NO ESOPHAGITIS 02/12/2007  . Female stress incontinence 02/12/2007  . Seizure disorder (HCC) 02/12/2007  . OSA (obstructive sleep apnea) 02/12/2007    Past Surgical History:  Procedure Laterality Date  . APPENDECTOMY    . CARDIAC CATHETERIZATION N/A 10/03/2016   Procedure: Right/Left Heart Cath and Coronary Angiography;  Surgeon: Dolores Patty, MD;  Location: [facility] INVASIVE CV LAB;  Service: Cardiovascular;  Laterality: N/A;  . HERNIA REPAIR  ? 1988; 04/15/1998  . INSERT / REPLACE / REMOVE PACEMAKER  12/16/1990   DDD EF 30%;  Marland Kitchen INSERT / REPLACE / REMOVE PACEMAKER  07/25/2003   replaced by BB, leads are both IS1 and from 1992  . INSERT / REPLACE / REMOVE PACEMAKER  05/21/2011   JA; generator change  . LEG SKIN LESION  BIOPSY / EXCISION  05/16/2001   Lichen Planus  . VENTRAL HERNIA REPAIR  1989; 04/15/1998     OB History   None      Home Medications    Prior to Admission medications   Medication Sig Start Date End Date Taking? Authorizing Provider  amiodarone (PACERONE) 200 MG tablet TAKE 1 TABLET BY MOUTH ONCE DAILY. 03/11/18   [name], [name], MD  cholecalciferol (VITAMIN D) 1000 units tablet Take 1 tablet (1,000 Units total) by mouth daily. 03/20/18   Latrelle Dodrill, MD  doxycycline (VIBRAMYCIN) 100 MG capsule Take 1 capsule (100 mg total) by mouth 2 (two) times daily. 04/15/18   Antony Madura, PA-C  fluticasone (FLONASE) 50 MCG/ACT nasal spray Place  2 sprays into both nostrils daily as needed. For congestion. 07/28/17   Latrelle Dodrill, MD  metolazone (ZAROXOLYN) 2.5 MG tablet Take 1 tablet (2.5 mg total) by mouth as needed (weight gain or shortness of breath). Take as directed Patient not taking: Reported on 03/31/2018 07/28/17   [name], [name], MD  nystatin cream (MYCOSTATIN) Apply to affected area 2 times daily for 3 weeks. 09/15/17   Marquette Saa, MD  omeprazole (PRILOSEC) 20 MG capsule TAKE (1) CAPSULE BY MOUTH ONCE DAILY. 03/20/18   Latrelle Dodrill, MD  PHENobarbital (LUMINAL) 32.4 MG tablet Take 2 tablets (64.8 mg total) by mouth 2 (two) times daily. 03/12/18   Latrelle Dodrill, MD  potassium chloride (K-DUR,KLOR-CON) 10 MEQ tablet Take 4 tablets (40 mEq total) by mouth 3 (three) times daily. Morning, Noon, and Night 12/02/17   [name], [name], MD  SAVAYSA 60 MG TABS tablet TAKE 1 TABLET BY MOUTH ONCE DAILY. 03/04/18   [name], [name], MD  torsemide (DEMADEX) 20 MG tablet Take 2 tablets (40 mg total) by mouth 2 (two) times daily. Morning and Noon 12/26/17   [name], [name], MD  triamcinolone ointment (KENALOG) 0.5 % Apply 1 application topically 3 (three) times daily. 09/15/17   Marquette Saa, MD    Family History Family History  Problem Relation Age of Onset  . Stroke Father        died from CAD?  Marland Kitchen Heart disease Father   . Cancer Mother   . Diabetes Son        Type 1  . Sleep apnea Son   . Cancer Sister        cervical  . Hypertension Sister   . Hypertension Brother   . Rheum arthritis Daughter        Also PGM, PGGM    Social History Social History   Tobacco Use  . Smoking status: Former Smoker    Packs/day: 1.00    Years: 7.00    Pack years: 7.00    Types: Cigarettes    Last attempt to quit: 03/16/1969    Years since quitting: 49.1  . Smokeless tobacco: Never Used  Substance Use Topics  . Alcohol use: No  . Drug use: No     Allergies   Aspirin; Penicillins;  Tape; Wool alcohol [lanolin]; Amoxicillin; and Ampicillin   Review of Systems Review of Systems Ten systems reviewed and are negative for acute change, except as noted in the HPI.    Physical Exam Updated Vital Signs BP 117/65   Pulse 60   Temp 98.5 F (36.9 C) (Oral)   Resp 20   Ht 5\' 2"  (1.575 m)   Wt 79.4 kg (175 lb)   SpO2 97%   BMI 32.01 kg/m   Physical Exam  Constitutional: She is oriented to person, place, and time. She appears well-developed and well-nourished. No distress.  Alert, pleasant, obese  HENT:  Head: Normocephalic and atraumatic.  Eyes: Conjunctivae and EOM are normal. No scleral icterus.  Neck: Normal range of motion.  Cardiovascular: Normal rate, regular rhythm and intact distal pulses.  Pulmonary/Chest: Effort normal. No stridor. No respiratory distress. She has no wheezes.  Respirations even and unlabored  Abdominal: Soft. She exhibits no mass. There is no rebound.    Abdomen obese with mild TTP in the bilateral upper quadrants. No peritoneal signs. Abdomen obese.  Musculoskeletal: Normal range of motion.  Neurological: She is alert and oriented to person, place, and time. She exhibits normal muscle tone. Coordination normal.  Skin: Skin is warm and dry. No rash noted. She is not diaphoretic. No pallor.  Psychiatric: She has a normal mood and affect. Her behavior is normal.  Nursing note and vitals reviewed.        ED Treatments / Results  Labs (all labs ordered are listed, but only abnormal results are displayed) Labs Reviewed  COMPREHENSIVE METABOLIC PANEL - Abnormal; Notable for the following components:      Result Value   ALT 13 (*)    All other components within normal limits  CBC WITH DIFFERENTIAL/PLATELET - Abnormal; Notable for the following components:   Platelets 110 (*)    All other components within  normal limits  URINALYSIS, ROUTINE W REFLEX MICROSCOPIC - Abnormal; Notable for the following components:   Color, Urine  STRAW (*)    Hgb urine dipstick SMALL (*)    All other components within normal limits  LIPASE, BLOOD    EKG None  Radiology Ct Abdomen Pelvis W Contrast  Result Date: 04/14/2018 CLINICAL DATA:  Right-sided abdominal pain. Open abdominal incision is draining from surgery years ago. EXAM: CT ABDOMEN AND PELVIS WITH CONTRAST TECHNIQUE: Multidetector CT imaging of the abdomen and pelvis was performed using the standard protocol following bolus administration of intravenous contrast. CONTRAST:  OMNIPAQUE IOHEXOL 300 MG/ML  SOLN COMPARISON:  09/02/2017 FINDINGS: Lower chest: Bronchial wall thickening with mild scarring in the lungs likely representing chronic bronchitis. Diffuse cardiac enlargement. Cardiac pacemaker wires. Hepatobiliary: Diffuse fatty infiltration of the liver. No focal liver lesions. Cholelithiasis with multiple stones in the gallbladder. No gallbladder wall thickening or edema. Bile ducts are not dilated. Pancreas: Fatty infiltration of the pancreas. No inflammatory changes. No ductal dilatation. Spleen: Spleen size is mildly enlarged. No focal lesions identified. Adrenals/Urinary Tract: No adrenal gland nodules. Bilateral renal parenchymal scarring. Nephrograms are symmetrical. No hydronephrosis or hydroureter. Bladder is unremarkable. Stomach/Bowel: Stomach is decompressed. Small bowel and colon are not abnormally distended. Scattered stool throughout the colon. Appendix is not identified. There is a large left inguinal hernia containing fat and sigmoid colon. No proximal obstruction. Vascular/Lymphatic: Aortic atherosclerosis. No enlarged abdominal or pelvic lymph nodes. Reproductive: Uterus and bilateral adnexa are unremarkable. Other: There is scarring in the low anterior abdominal wall with surgical clips, likely postoperative from ventral hernia repair. In the low anterior pelvic wall, there is associated soft tissue thickening with central low-attenuation measuring about 1.1  x 8.7 cm in diameter. This is unchanged since the previous study from 09/02/2017. This is associated with a postoperative changes in the hernia repair. A small chronic fluid collection could have this appearance but is felt to be unlikely due to the lack of interval change. Residual fat herniation inferior to the hernia repair. No free air or free fluid in the abdomen. Musculoskeletal: Degenerative changes in the spine. Degenerative changes in the hips. No destructive bone lesions. IMPRESSION: 1. Postoperative changes from ventral hernia repair with such shaded scarring in the anterior abdominal wall and surgical clips. There is a linear thick-walled collection in the anterior pelvic wall associated with hernia repair. This is likely postoperative. No change since previous study. Residual recurrent fat herniation inferior to the area of repair, also similar to prior study. 2. Large left inguinal hernia containing sigmoid colon without proximal obstruction. 3. Chronic bronchitic changes in the lungs. 4. Cardiac enlargement. 5. Diffuse fatty infiltration of the liver. 6. Cholelithiasis without evidence of cholecystitis. Electronically Signed   By: Burman Nieves M.D.   On: 04/14/2018 23:56    Procedures Procedures (including critical care time)  Medications Ordered in ED Medications  iohexol (OMNIPAQUE) 300 MG/ML solution 100 mL (100 mLs Intravenous Contrast Given 04/14/18 2310)     Initial Impression / Assessment and Plan / ED Course  I have reviewed the triage vital signs and the nursing notes.  Pertinent labs & imaging results that were available during my care of the patient were reviewed by me and considered in my medical decision making (see chart for details).     67 year old female presents to the emergency department for multiple complaints.  She is initially complaining of abdominal pain present in her upper abdomen for the past month.  She has some tenderness across her left upper  quadrant and right upper quadrant.  Patient afebrile without history of vomiting or bowel changes.  Laboratory work-up reassuring without leukocytosis.  No electrolyte derangements and liver and kidney function preserved.  Lipase normal.  CT was obtained which does not show any evidence of acute or emergent process.  There is evidence of gallstones; however, no pericholecystic fluid or evidence of gallbladder wall thickening to suggest acute cholecystitis.  This is also unlikely given symptom chronicity.  Supportive management indicated.  Patient further noting increased redness and drainage from prior surgical incision site.  She has a history of this previously and area appears to correspond with a linear thick walled collection in the anterior pelvic wall.  This is stable from prior imaging which could represent chronic fluid collection versus recurrent seroma.  Will place on antibiotics to cover for abdominal wall cellulitis.  Patient stable for follow-up with her general surgeon on an outpatient basis.  Return precautions discussed and provided. Patient discharged in stable condition with no unaddressed concerns.  Vitals:   04/14/18 2040 04/14/18 2251 04/15/18 0103 04/15/18 0104  BP: 122/76 113/69 117/65   Pulse: 60 60  60  Resp: 20 20    Temp: 98.6 F (37 C) 98.5 F (36.9 C)    TempSrc: Oral Oral    SpO2: 100% 98%  97%  Weight:      Height:        Final Clinical Impressions(s) / ED Diagnoses   Final diagnoses:  Abdominal pain, unspecified abdominal location  Abdominal wall cellulitis    ED Discharge Orders        Ordered    doxycycline (VIBRAMYCIN) 100 MG capsule  2 times daily     04/15/18 0045       Antony Madura, PA-C 04/15/18 0606    Dione Booze, MD 04/15/18 0700

## 2018-04-16 ENCOUNTER — Other Ambulatory Visit: Payer: Self-pay | Admitting: Family Medicine

## 2018-04-19 DIAGNOSIS — R0902 Hypoxemia: Secondary | ICD-10-CM | POA: Diagnosis not present

## 2018-04-19 DIAGNOSIS — G4733 Obstructive sleep apnea (adult) (pediatric): Secondary | ICD-10-CM | POA: Diagnosis not present

## 2018-04-21 LAB — FECAL OCCULT BLOOD, IMMUNOCHEMICAL: Fecal Occult Bld: NEGATIVE

## 2018-04-30 ENCOUNTER — Ambulatory Visit (INDEPENDENT_AMBULATORY_CARE_PROVIDER_SITE_OTHER): Payer: Medicare Other | Admitting: *Deleted

## 2018-04-30 DIAGNOSIS — I482 Chronic atrial fibrillation, unspecified: Secondary | ICD-10-CM

## 2018-04-30 DIAGNOSIS — Z950 Presence of cardiac pacemaker: Secondary | ICD-10-CM

## 2018-04-30 NOTE — Progress Notes (Signed)
Remote pacemaker transmission.   

## 2018-05-01 ENCOUNTER — Encounter: Payer: Self-pay | Admitting: Cardiology

## 2018-05-05 ENCOUNTER — Other Ambulatory Visit (HOSPITAL_COMMUNITY): Payer: Self-pay

## 2018-05-05 NOTE — Progress Notes (Signed)
Paramedicine Encounter    Patient ID: Misty Gray, female    DOB: 1951/03/01, 67 y.o.   MRN: [id number]   Patient Care Team: Latrelle Dodrill, MD as PCP - General (Family Medicine) [name], MD (Cardiology)  Patient Active Problem List   Diagnosis Date Noted  . Abdominal wall hernia 11/05/2017  . Anticoagulated 11/05/2017  . Rash and nonspecific skin eruption 09/15/2017  . Ventral hernia without obstruction or gangrene 08/15/2017  . Skin irritation 07/23/2017  . Chronic venous stasis dermatitis 12/26/2016  . Urinary tract infection without hematuria   . Frequent PVCs   . Hypokalemia   . Housing problems 11/23/2016  . Chronic atrial fibrillation (HCC) 11/01/2016  . Hypotension 10/31/2016  . Special screening for malignant neoplasms, colon 08/12/2016  . Heme positive stool 08/09/2016  . Intertrigo 08/09/2016  . Chronic anticoagulation 04/26/2016  . Chronic venous insufficiency   . Preventative health care 10/05/2014  . Breast mass, left 10/05/2014  . Osteopenia 09/15/2014  . Hypertension 01/04/2014  . Chronic systolic dysfunction of left ventricle 05/05/2013  . At high risk for falls 02/11/2013  . Vitamin D deficiency 07/15/2012  . Sinoatrial node dysfunction (HCC) 05/08/2011  . Pacemaker 08/10/2009  . Allergic rhinitis 05/31/2008  . Morbid obesity (HCC) 02/12/2007  . GASTROESOPHAGEAL REFLUX, NO ESOPHAGITIS 02/12/2007  . Female stress incontinence 02/12/2007  . Seizure disorder (HCC) 02/12/2007  . OSA (obstructive sleep apnea) 02/12/2007    Current Outpatient Medications:  .  amiodarone (PACERONE) 200 MG tablet, TAKE 1 TABLET BY MOUTH ONCE DAILY., Disp: 30 tablet, Rfl: 3 .  cholecalciferol (VITAMIN D) 1000 units tablet, Take 1 tablet (1,000 Units total) by mouth daily., Disp: 90 tablet, Rfl: 3 .  fluticasone (FLONASE) 50 MCG/ACT nasal spray, Place 2 sprays into both nostrils daily as needed. For congestion., Disp: 16 g, Rfl: 1 .  omeprazole (PRILOSEC) 20 MG  capsule, TAKE (1) CAPSULE BY MOUTH ONCE DAILY., Disp: 30 capsule, Rfl: 1 .  PHENobarbital (LUMINAL) 32.4 MG tablet, TAKE (2) TABLETS BY MOUTH TWICE DAILY. (MORNING AND BEDTIME), Disp: 112 tablet, Rfl: 0 .  potassium chloride (K-DUR,KLOR-CON) 10 MEQ tablet, Take 4 tablets (40 mEq total) by mouth 3 (three) times daily. Morning, Noon, and Night, Disp: 336 tablet, Rfl: 2 .  SAVAYSA 60 MG TABS tablet, TAKE 1 TABLET BY MOUTH ONCE DAILY., Disp: 28 tablet, Rfl: 6 .  torsemide (DEMADEX) 20 MG tablet, Take 2 tablets (40 mg total) by mouth 2 (two) times daily. Morning and Noon, Disp: 112 tablet, Rfl: 6 .  doxycycline (VIBRAMYCIN) 100 MG capsule, Take 1 capsule (100 mg total) by mouth 2 (two) times daily. (Patient not taking: Reported on 05/05/2018), Disp: 20 capsule, Rfl: 0 .  metolazone (ZAROXOLYN) 2.5 MG tablet, Take 1 tablet (2.5 mg total) by mouth as needed (weight gain or shortness of breath). Take as directed (Patient not taking: Reported on 03/31/2018), Disp: 15 tablet, Rfl: 1 .  nystatin cream (MYCOSTATIN), Apply to affected area 2 times daily for 3 weeks. (Patient not taking: Reported on 05/05/2018), Disp: 30 g, Rfl: 1 .  triamcinolone ointment (KENALOG) 0.5 %, Apply 1 application topically 3 (three) times daily. (Patient not taking: Reported on 05/05/2018), Disp: 60 g, Rfl: 0 Allergies  Allergen Reactions  . Aspirin Hives and Rash  . Penicillins Rash and Other (See Comments)    Has patient had a PCN reaction causing immediate rash, facial/tongue/throat swelling, SOB or lightheadedness with hypotension: Yes Has patient had a PCN reaction causing severe rash involving  mucus membranes or skin necrosis: No Has patient had a PCN reaction that required hospitalization No Has patient had a PCN reaction occurring within the last 10 years: Yes If all of the above answers are "NO", then may proceed with Cephalosporin use.   . Tape Other (See Comments)    NO PAPER TAPE-MUST BE MEDICAL TAPE - unknown reaction  .  Wool Alcohol [Lanolin] Hives  . Amoxicillin Rash  . Ampicillin Itching and Rash      Social History   Socioeconomic History  . Marital status: Married    Spouse name: [name]  . Number of children: 4  . Years of education: 49  . Highest education level: Not on file  Occupational History  . Occupation: disabled  Social Needs  . Financial resource strain: Not on file  . Food insecurity:    Worry: Not on file    Inability: Not on file  . Transportation needs:    Medical: Not on file    Non-medical: Not on file  Tobacco Use  . Smoking status: Former Smoker    Packs/day: 1.00    Years: 7.00    Pack years: 7.00    Types: Cigarettes    Last attempt to quit: 03/16/1969    Years since quitting: 49.1  . Smokeless tobacco: Never Used  Substance and Sexual Activity  . Alcohol use: No  . Drug use: No  . Sexual activity: Not Currently    Birth control/protection: Post-menopausal  Lifestyle  . Physical activity:    Days per week: Not on file    Minutes per session: Not on file  . Stress: Not on file  Relationships  . Social connections:    Talks on phone: Not on file    Gets together: Not on file    Attends religious service: Not on file    Active member of club or organization: Not on file    Attends meetings of clubs or organizations: Not on file    Relationship status: Not on file  . Intimate partner violence:    Fear of current or ex partner: Not on file    Emotionally abused: Not on file    Physically abused: Not on file    Forced sexual activity: Not on file  Other Topics Concern  . Not on file  Social History Narrative   Lives with husband [name] lives with them as do his 2 children   Daughter, [name], and her 3 children live in [place] -  [name] is incarcerated   Nephew, Blandina Renaldo lives with them   Daughter, [name], in [facility] [place] [facility] [name] [address]   Moved June 2013 to a better home on [facility].       Lives with husband, son, grandson, granddaughter, nephew.    Physical Exam      Future Appointments  Date Time Provider Department Center  05/15/2018  9:10 AM Latrelle Dodrill, MD [facility]  07/30/2018  8:40 AM [facility] DEVICE REMOTES [name]    BP 108/64   Pulse 60   Resp 14   Wt 182 lb (82.6 kg)   SpO2 98%   BMI 33.29 kg/m   Weight yesterday-183 Last visit weight-175  Pt reports she is doing ok, she is having ongoing issues with her abd with her hernia, she went to ER on 4/30 for drainage for her surgical wound she had over 63yrs ago and she  was prescribed doxy however she reports she never took it as she didn't have the money to get it. She reports it appears better now than it did the time she went to ER.  Her bubble pack of pills has changed the torsemide tablet to 10mg  so she has to take 4 where she was used to taking 2 BID-she has only taken the 2 BID for the past week. So she has been shorting herself some torsemide but she will begin taking all of them. She is up 8lbs in a month but we will monitor that and she will let me know. Her abd is distended but she also has that hernia that is causing her pains too. She denies any increased sob. No swelling to lower extremities. She reports limiting her sodium intake and fluid intake.   Kerry Hough, EMT-Paramedic [phone] [facility] Paramedic  05/05/18

## 2018-05-12 LAB — CUP PACEART REMOTE DEVICE CHECK
Battery Remaining Longevity: 55 mo
Battery Remaining Percentage: 57 %
Brady Statistic AP VP Percent: 1.3 %
Brady Statistic AS VS Percent: 1.5 %
Brady Statistic RA Percent Paced: 92 %
Date Time Interrogation Session: 20190516124618
Implantable Lead Implant Date: 19920327
Implantable Lead Location: 753860
Implantable Pulse Generator Implant Date: 20120605
Lead Channel Impedance Value: 360 Ohm
Lead Channel Pacing Threshold Pulse Width: 1 ms
Lead Channel Sensing Intrinsic Amplitude: 1.7 mV
Lead Channel Setting Pacing Amplitude: 2.5 V
MDC IDC LEAD IMPLANT DT: 19920327
MDC IDC LEAD LOCATION: 753859
MDC IDC MSMT BATTERY VOLTAGE: 2.89 V
MDC IDC MSMT LEADCHNL RA PACING THRESHOLD AMPLITUDE: 1.5 V
MDC IDC MSMT LEADCHNL RA PACING THRESHOLD PULSEWIDTH: 0.5 ms
MDC IDC MSMT LEADCHNL RV IMPEDANCE VALUE: 480 Ohm
MDC IDC MSMT LEADCHNL RV PACING THRESHOLD AMPLITUDE: 1.25 V
MDC IDC MSMT LEADCHNL RV SENSING INTR AMPL: 6.9 mV
MDC IDC SET LEADCHNL RA PACING AMPLITUDE: 2.5 V
MDC IDC SET LEADCHNL RV PACING PULSEWIDTH: 1 ms
MDC IDC SET LEADCHNL RV SENSING SENSITIVITY: 1.5 mV
MDC IDC STAT BRADY AP VS PERCENT: 95 %
MDC IDC STAT BRADY AS VP PERCENT: 1 %
MDC IDC STAT BRADY RV PERCENT PACED: 1.7 %
Pulse Gen Model: 2210
Pulse Gen Serial Number: 7254513

## 2018-05-14 ENCOUNTER — Telehealth (HOSPITAL_COMMUNITY): Payer: Self-pay

## 2018-05-14 NOTE — Telephone Encounter (Signed)
Called pt today for a weigh in--last week she was 182 and this week she is down to 177. Last week she thought the torsemide was wrong in her pill box so she wasn't taking all the pills but now she is.  She denies any increased sob, she feels like the hernia is getting bigger. She reports the GI doc from [facility] said she needed to get cleared before they proceed with anything further. I contacted clinic regarding what needs to be done---jazmin advised normally the requesting physician faxes them the procedure and plan to clinic and then the clinic provider will fax back their response. Will let pt know to call that doc and let them know to fax it to heart clinic.    Kerry Hough, EMT-Paramedic  05/14/18

## 2018-05-15 ENCOUNTER — Other Ambulatory Visit: Payer: Self-pay | Admitting: Family Medicine

## 2018-05-15 ENCOUNTER — Ambulatory Visit (INDEPENDENT_AMBULATORY_CARE_PROVIDER_SITE_OTHER): Payer: Medicare Other | Admitting: Family Medicine

## 2018-05-15 ENCOUNTER — Encounter: Payer: Self-pay | Admitting: Family Medicine

## 2018-05-15 DIAGNOSIS — G4733 Obstructive sleep apnea (adult) (pediatric): Secondary | ICD-10-CM | POA: Diagnosis not present

## 2018-05-15 DIAGNOSIS — K439 Ventral hernia without obstruction or gangrene: Secondary | ICD-10-CM | POA: Diagnosis not present

## 2018-05-15 NOTE — Patient Instructions (Signed)
It was great to see you again today!  If the stomach bothers you more please call [facility] surgeon for an appointment.  For the rash, start with nystatin, do this 2-3 times a day for 2 weeks. If not better switch to the triamcinolone.  Wrote letter for your landlord today.  Follow up here in 3 months.   Be well, Dr. Pollie Meyer

## 2018-05-15 NOTE — Progress Notes (Signed)
Date of Visit: 05/15/2018   HPI:  Misty Gray presents for follow up after recent ED visit.  Seen in ED on 4/30 for abdominal pain and purulent drainage from wound on abdomen. Had CT scan done that showed linear thick walled collection in anterior pelvic wall with some fat herniation in area of prior hernia repair. Also left large inguinal hernia containing sigmoid colon without obstruction. Patient was given rx for doxycycline for possible abdominal wall cellulitis, but never got it filled. Has been doing much better since that hospital visit. Area on stomach has scabbed over back to its normal state. ED had instructed her to follow up with surgeon at [facility], but patient reports she doesn't have money to get to [facility].   Rash under arms - has rash beneath her arms where her bra rubs. Also some erythematous rash beneath breasts. Has both nystatin and triamcinolone at home readily available to her.  Letter for landlord - still needs letter for landlord so that she can have adequate electrical supply to her bedroom to support lamp, CPAP, and heart monitor for her pacer.   ROS: See HPI.  PMFSH: history of seizure disorder, chronic systolic dysfunction, stasis dermatitis, hypertension, morbid obesity, chronic hernias, OSA, afib  PHYSICAL EXAM: BP 100/65   Pulse 66   Temp 98 F (36.7 C)   Ht 5\' 2"  (1.575 m)   Wt 179 lb 9.6 oz (81.5 kg)   SpO2 94%   BMI 32.85 kg/m  Gen: no acute distress, pleasant, cooperative HEENT: normocephalic, atraumatic, moist mucous membranes  Heart: regular rate and rhythm  Lungs: clear to auscultation bilaterally, normal work of breathing  Neuro: alert, grossly nonfocal, speech normal Abdomen: soft, nontender to palpation, nondistended. Area of chronic erythema appears stable from prior exams. No purulence noted, chronic wound scabbed over. Ext: minimal edema bilateral lower extremities   ASSESSMENT/PLAN:  Abdominal wall hernia No signs of skin infection  presently. Encouraged patient that if she has recurrent abdominal pain that she should be seen by surgeon.  OSA (obstructive sleep apnea) Gave patient letter to give to her landlord asking for assessment of electrical supply to her bedroom to support her medical devices.  Rash - appears likely candidal but may be contact dermatitis. Recommend nystatin trial, if no improvement with that then switch to triamcinolone.  FOLLOW UP: Follow up in 3 months with me for routine medical issues  [place] J. Pollie Meyer, MD [facility]

## 2018-05-16 DIAGNOSIS — G4733 Obstructive sleep apnea (adult) (pediatric): Secondary | ICD-10-CM | POA: Diagnosis not present

## 2018-05-18 NOTE — Assessment & Plan Note (Signed)
No signs of skin infection presently. Encouraged patient that if she has recurrent abdominal pain that she should be seen by surgeon.

## 2018-05-18 NOTE — Assessment & Plan Note (Signed)
Gave patient letter to give to her landlord asking for assessment of electrical supply to her bedroom to support her medical devices.

## 2018-05-20 DIAGNOSIS — R0902 Hypoxemia: Secondary | ICD-10-CM | POA: Diagnosis not present

## 2018-05-20 DIAGNOSIS — G4733 Obstructive sleep apnea (adult) (pediatric): Secondary | ICD-10-CM | POA: Diagnosis not present

## 2018-06-11 ENCOUNTER — Other Ambulatory Visit: Payer: Self-pay | Admitting: *Deleted

## 2018-06-15 DIAGNOSIS — G4733 Obstructive sleep apnea (adult) (pediatric): Secondary | ICD-10-CM | POA: Diagnosis not present

## 2018-06-15 MED ORDER — PHENOBARBITAL 32.4 MG PO TABS: ORAL_TABLET | ORAL | 3 refills | Status: DC

## 2018-06-19 ENCOUNTER — Other Ambulatory Visit (HOSPITAL_COMMUNITY): Payer: Self-pay | Admitting: Internal Medicine

## 2018-06-19 DIAGNOSIS — R0902 Hypoxemia: Secondary | ICD-10-CM | POA: Diagnosis not present

## 2018-06-19 DIAGNOSIS — G4733 Obstructive sleep apnea (adult) (pediatric): Secondary | ICD-10-CM | POA: Diagnosis not present

## 2018-06-22 ENCOUNTER — Telehealth (HOSPITAL_COMMUNITY): Payer: Self-pay

## 2018-06-22 NOTE — Telephone Encounter (Signed)
Pt called me to report that she is on her last pack of pills and asked if I could call pharmacy to see when they would be delivered out to her. I called them and pharmacy reports it is sch for delivery today. I called her back to let her know that. She asked when I was coming out and advised her it was as an as-needed basis- she asked if I could come see her this week as her weight is all over the place. So I sch her for Thursday to be seen this week.   Kerry Hough, EMT-Paramedic  06/22/18

## 2018-06-25 ENCOUNTER — Other Ambulatory Visit (HOSPITAL_COMMUNITY): Payer: Self-pay

## 2018-06-25 NOTE — Progress Notes (Signed)
Paramedicine Encounter    Patient ID: Misty Gray, female    DOB: [birth year], 67 y.o.   MRN: [id number]   Patient Care Team: Latrelle Dodrill, MD as PCP - General (Family Medicine) [name], MD (Cardiology)  Patient Active Problem List   Diagnosis Date Noted  . Abdominal wall hernia 11/05/2017  . Anticoagulated 11/05/2017  . Rash and nonspecific skin eruption 09/15/2017  . Ventral hernia without obstruction or gangrene 08/15/2017  . Skin irritation 07/23/2017  . Chronic venous stasis dermatitis 12/26/2016  . Urinary tract infection without hematuria   . Frequent PVCs   . Hypokalemia   . Housing problems 11/23/2016  . Chronic atrial fibrillation (HCC) 11/01/2016  . Hypotension 10/31/2016  . Special screening for malignant neoplasms, colon 08/12/2016  . Heme positive stool 08/09/2016  . Intertrigo 08/09/2016  . Chronic anticoagulation 04/26/2016  . Chronic venous insufficiency   . Preventative health care 10/05/2014  . Breast mass, left 10/05/2014  . Osteopenia 09/15/2014  . Hypertension 01/04/2014  . Chronic systolic dysfunction of left ventricle 05/05/2013  . At high risk for falls 02/11/2013  . Vitamin D deficiency 07/15/2012  . Sinoatrial node dysfunction (HCC) 05/08/2011  . Pacemaker 08/10/2009  . Allergic rhinitis 05/31/2008  . Morbid obesity (HCC) 02/12/2007  . GASTROESOPHAGEAL REFLUX, NO ESOPHAGITIS 02/12/2007  . Female stress incontinence 02/12/2007  . Seizure disorder (HCC) 02/12/2007  . OSA (obstructive sleep apnea) 02/12/2007    Current Outpatient Medications:  .  amiodarone (PACERONE) 200 MG tablet, TAKE 1 TABLET BY MOUTH ONCE DAILY., Disp: 30 tablet, Rfl: 3 .  cholecalciferol (VITAMIN D) 1000 units tablet, Take 1 tablet (1,000 Units total) by mouth daily., Disp: 90 tablet, Rfl: 3 .  fluticasone (FLONASE) 50 MCG/ACT nasal spray, Place 2 sprays into both nostrils daily as needed. For congestion., Disp: 16 g, Rfl: 1 .  nystatin cream (MYCOSTATIN),  Apply to affected area 2 times daily for 3 weeks., Disp: 30 g, Rfl: 1 .  omeprazole (PRILOSEC) 20 MG capsule, TAKE (1) CAPSULE BY MOUTH ONCE DAILY., Disp: 90 capsule, Rfl: 1 .  PHENobarbital (LUMINAL) 32.4 MG tablet, TAKE (2) TABLETS BY MOUTH TWICE DAILY. (MORNING AND BEDTIME), Disp: 112 tablet, Rfl: 3 .  potassium chloride (K-DUR,KLOR-CON) 10 MEQ tablet, Take 4 tablets (40 mEq total) by mouth 3 (three) times daily. Morning, Noon, and Night, Disp: 336 tablet, Rfl: 2 .  SAVAYSA 60 MG TABS tablet, TAKE 1 TABLET BY MOUTH ONCE DAILY., Disp: 28 tablet, Rfl: 6 .  torsemide (DEMADEX) 20 MG tablet, Take 2 tablets (40 mg total) by mouth 2 (two) times daily. Morning and Noon, Disp: 112 tablet, Rfl: 6 .  triamcinolone ointment (KENALOG) 0.5 %, Apply 1 application topically 3 (three) times daily., Disp: 60 g, Rfl: 0 Allergies  Allergen Reactions  . Aspirin Hives and Rash  . Penicillins Rash and Other (See Comments)    Has patient had a PCN reaction causing immediate rash, facial/tongue/throat swelling, SOB or lightheadedness with hypotension: Yes Has patient had a PCN reaction causing severe rash involving mucus membranes or skin necrosis: No Has patient had a PCN reaction that required hospitalization No Has patient had a PCN reaction occurring within the last 10 years: Yes If all of the above answers are "NO", then may proceed with Cephalosporin use.   . Tape Other (See Comments)    NO PAPER TAPE-MUST BE MEDICAL TAPE - unknown reaction  . Wool Alcohol [Lanolin] Hives  . Amoxicillin Rash  . Ampicillin Itching and Rash  Social History   Socioeconomic History  . Marital status: Married    Spouse name: [name]  . Number of children: 4  . Years of education: 46  . Highest education level: Not on file  Occupational History  . Occupation: disabled  Social Needs  . Financial resource strain: Not on file  . Food insecurity:    Worry: Not on file    Inability: Not on file  . Transportation  needs:    Medical: Not on file    Non-medical: Not on file  Tobacco Use  . Smoking status: Former Smoker    Packs/day: 1.00    Years: 7.00    Pack years: 7.00    Types: Cigarettes    Last attempt to quit: 03/16/1969    Years since quitting: 49.3  . Smokeless tobacco: Never Used  Substance and Sexual Activity  . Alcohol use: No  . Drug use: No  . Sexual activity: Not Currently    Birth control/protection: Post-menopausal  Lifestyle  . Physical activity:    Days per week: Not on file    Minutes per session: Not on file  . Stress: Not on file  Relationships  . Social connections:    Talks on phone: Not on file    Gets together: Not on file    Attends religious service: Not on file    Active member of club or organization: Not on file    Attends meetings of clubs or organizations: Not on file    Relationship status: Not on file  . Intimate partner violence:    Fear of current or ex partner: Not on file    Emotionally abused: Not on file    Physically abused: Not on file    Forced sexual activity: Not on file  Other Topics Concern  . Not on file  Social History Narrative   Lives with husband [name] lives with them as do his 2 children   Daughter, [name], and her 3 children live in [place] -  [name] is incarcerated   Nephew, Aymee Denmon lives with them   Daughter, [name], in [facility] [name] [address]   Moved June 2013 to a better home on [facility].      Lives with husband, son, grandson, granddaughter, nephew.    Physical Exam      Future Appointments  Date Time Provider Department Center  07/30/2018  8:40 AM [facility] DEVICE REMOTES [name]    BP 108/80   Pulse 60   Resp 15   SpO2 97%  Day before-188 Weight yesterday-187  Last visit weight-182  Pt called requesting a visit this week due to her fluctuating weights. She has a hernia also and isnt sure if its fluid or the hernia  growing causing her the weight gain. She is being f/u by doc for the hernia. Last seen was 5/31.  She just got her meds delivered the other day. Everything is correct.  She didn't weigh this morning. pts weight is up from last visit which has been a few wks.  It looks like her hernia has grown since my last visit. No swelling to her legs. No increased sob--she always gets sob at times but nothing new. She reports that her insurance will only cover it if she stays the whole 24 hrs--she said they would keep her for at least 24 hrs--she also states that her transportation is the issue now-she  has a truck and cant get in and out very easily-she uses a step stool when she does get in it.  She states her left hand gets numb and tingly very often. Advised her she needed to see her abd doc to f/u with surgery if offered for her for the hernia. Will check up on the transportation part.     Kerry Hough, EMT-Paramedic [phone] [facility] Paramedic  06/25/18

## 2018-07-14 ENCOUNTER — Other Ambulatory Visit: Payer: Self-pay | Admitting: Family Medicine

## 2018-07-14 ENCOUNTER — Other Ambulatory Visit (HOSPITAL_COMMUNITY): Payer: Self-pay | Admitting: Internal Medicine

## 2018-07-16 DIAGNOSIS — G4733 Obstructive sleep apnea (adult) (pediatric): Secondary | ICD-10-CM | POA: Diagnosis not present

## 2018-07-20 DIAGNOSIS — G4733 Obstructive sleep apnea (adult) (pediatric): Secondary | ICD-10-CM | POA: Diagnosis not present

## 2018-07-20 DIAGNOSIS — R0902 Hypoxemia: Secondary | ICD-10-CM | POA: Diagnosis not present

## 2018-07-30 ENCOUNTER — Telehealth: Payer: Self-pay | Admitting: Cardiology

## 2018-07-30 ENCOUNTER — Encounter: Payer: Medicare Other | Admitting: *Deleted

## 2018-07-30 NOTE — Telephone Encounter (Signed)
Spoke with pt and reminded pt of remote transmission that is due today. Pt verbalized understanding.   

## 2018-08-05 ENCOUNTER — Encounter: Payer: Self-pay | Admitting: Cardiology

## 2018-08-16 DIAGNOSIS — G4733 Obstructive sleep apnea (adult) (pediatric): Secondary | ICD-10-CM | POA: Diagnosis not present

## 2018-08-20 DIAGNOSIS — R0902 Hypoxemia: Secondary | ICD-10-CM | POA: Diagnosis not present

## 2018-08-20 DIAGNOSIS — G4733 Obstructive sleep apnea (adult) (pediatric): Secondary | ICD-10-CM | POA: Diagnosis not present

## 2018-09-08 ENCOUNTER — Telehealth (HOSPITAL_COMMUNITY): Payer: Self-pay

## 2018-09-08 NOTE — Telephone Encounter (Signed)
Called pt today for f/u for medication delivery. She is having no difficulty with receiving her medications. She denies any issues at this point. She is aware to call if she does have any issues. No further home visits needed right now. She is on an as-needed basis (observation), monthly phone call check in.   Kerry Hough, EMT-Paramedic  09/08/18

## 2018-09-09 ENCOUNTER — Other Ambulatory Visit: Payer: Self-pay | Admitting: Family Medicine

## 2018-09-09 ENCOUNTER — Other Ambulatory Visit (HOSPITAL_COMMUNITY): Payer: Self-pay | Admitting: Internal Medicine

## 2018-09-09 ENCOUNTER — Other Ambulatory Visit: Payer: Self-pay | Admitting: Cardiology

## 2018-09-09 NOTE — Telephone Encounter (Signed)
Both meds has refills left at the pharmacy.

## 2018-09-09 NOTE — Telephone Encounter (Signed)
I called the pharmacy because this came from them electronically via the interface. They filled this today so technically patient is good for the month; however, they send the refill request when they fill the last refill because they do packaging for patients in pill packs and mail medications and they do not want to wait until it is due to get the refill.

## 2018-09-15 DIAGNOSIS — G4733 Obstructive sleep apnea (adult) (pediatric): Secondary | ICD-10-CM | POA: Diagnosis not present

## 2018-10-07 ENCOUNTER — Other Ambulatory Visit: Payer: Self-pay | Admitting: Family Medicine

## 2018-10-08 ENCOUNTER — Other Ambulatory Visit (HOSPITAL_COMMUNITY): Payer: Self-pay | Admitting: Internal Medicine

## 2018-10-16 DIAGNOSIS — G4733 Obstructive sleep apnea (adult) (pediatric): Secondary | ICD-10-CM | POA: Diagnosis not present

## 2018-11-03 ENCOUNTER — Other Ambulatory Visit: Payer: Self-pay | Admitting: Cardiology

## 2018-11-03 ENCOUNTER — Other Ambulatory Visit: Payer: Self-pay | Admitting: Family Medicine

## 2018-11-04 ENCOUNTER — Other Ambulatory Visit (HOSPITAL_COMMUNITY): Payer: Self-pay | Admitting: Internal Medicine

## 2018-11-04 ENCOUNTER — Other Ambulatory Visit: Payer: Self-pay

## 2018-11-04 NOTE — Telephone Encounter (Signed)
Please ask patient to schedule follow up with me, thanks! Tirsa Gail J Verita Kuroda, MD  

## 2018-11-15 DIAGNOSIS — G4733 Obstructive sleep apnea (adult) (pediatric): Secondary | ICD-10-CM | POA: Diagnosis not present

## 2018-11-17 ENCOUNTER — Ambulatory Visit: Payer: Medicare Other

## 2018-11-18 ENCOUNTER — Ambulatory Visit (HOSPITAL_COMMUNITY)
Admission: RE | Admit: 2018-11-18 | Discharge: 2018-11-18 | Disposition: A | Discharge status: Home / Self Care | Payer: Medicare Other | Source: Ambulatory Visit | Attending: Internal Medicine | Admitting: Internal Medicine

## 2018-11-18 VITALS — BP 120/82 | HR 62 | Wt 202.6 lb

## 2018-11-18 DIAGNOSIS — I519 Heart disease, unspecified: Secondary | ICD-10-CM | POA: Diagnosis not present

## 2018-11-18 DIAGNOSIS — I5022 Chronic systolic (congestive) heart failure: Secondary | ICD-10-CM | POA: Diagnosis not present

## 2018-11-18 DIAGNOSIS — Z8249 Family history of ischemic heart disease and other diseases of the circulatory system: Secondary | ICD-10-CM | POA: Insufficient documentation

## 2018-11-18 DIAGNOSIS — Z7901 Long term (current) use of anticoagulants: Secondary | ICD-10-CM | POA: Diagnosis not present

## 2018-11-18 DIAGNOSIS — I110 Hypertensive heart disease with heart failure: Secondary | ICD-10-CM | POA: Insufficient documentation

## 2018-11-18 DIAGNOSIS — K219 Gastro-esophageal reflux disease without esophagitis: Secondary | ICD-10-CM | POA: Diagnosis not present

## 2018-11-18 DIAGNOSIS — Z886 Allergy status to analgesic agent status: Secondary | ICD-10-CM | POA: Insufficient documentation

## 2018-11-18 DIAGNOSIS — Z79899 Other long term (current) drug therapy: Secondary | ICD-10-CM | POA: Insufficient documentation

## 2018-11-18 DIAGNOSIS — Z950 Presence of cardiac pacemaker: Secondary | ICD-10-CM | POA: Diagnosis not present

## 2018-11-18 DIAGNOSIS — I493 Ventricular premature depolarization: Secondary | ICD-10-CM

## 2018-11-18 DIAGNOSIS — Z8673 Personal history of transient ischemic attack (TIA), and cerebral infarction without residual deficits: Secondary | ICD-10-CM | POA: Insufficient documentation

## 2018-11-18 DIAGNOSIS — I495 Sick sinus syndrome: Secondary | ICD-10-CM | POA: Insufficient documentation

## 2018-11-18 DIAGNOSIS — E7800 Pure hypercholesterolemia, unspecified: Secondary | ICD-10-CM | POA: Insufficient documentation

## 2018-11-18 DIAGNOSIS — Z6837 Body mass index (BMI) 37.0-37.9, adult: Secondary | ICD-10-CM | POA: Diagnosis not present

## 2018-11-18 DIAGNOSIS — G4733 Obstructive sleep apnea (adult) (pediatric): Secondary | ICD-10-CM | POA: Diagnosis not present

## 2018-11-18 DIAGNOSIS — Z888 Allergy status to other drugs, medicaments and biological substances status: Secondary | ICD-10-CM | POA: Diagnosis not present

## 2018-11-18 DIAGNOSIS — I428 Other cardiomyopathies: Secondary | ICD-10-CM | POA: Insufficient documentation

## 2018-11-18 DIAGNOSIS — I482 Chronic atrial fibrillation, unspecified: Secondary | ICD-10-CM | POA: Diagnosis not present

## 2018-11-18 DIAGNOSIS — Z880 Allergy status to penicillin: Secondary | ICD-10-CM | POA: Insufficient documentation

## 2018-11-18 DIAGNOSIS — Z87891 Personal history of nicotine dependence: Secondary | ICD-10-CM | POA: Insufficient documentation

## 2018-11-18 LAB — COMPREHENSIVE METABOLIC PANEL
ALT: 12 U/L (ref 0–44)
AST: 19 U/L (ref 15–41)
Albumin: 3.7 g/dL (ref 3.5–5.0)
Alkaline Phosphatase: 117 U/L (ref 38–126)
Anion gap: 10 (ref 5–15)
BUN: 27 mg/dL — AB (ref 8–23)
CHLORIDE: 103 mmol/L (ref 98–111)
CO2: 26 mmol/L (ref 22–32)
CREATININE: 0.95 mg/dL (ref 0.44–1.00)
Calcium: 9.7 mg/dL (ref 8.9–10.3)
GFR calc Af Amer: >60 mL/min (ref >60)
GFR calc non Af Amer: >60 mL/min (ref >60)
GLUCOSE: 112 mg/dL — AB (ref 70–99)
POTASSIUM: 4.4 mmol/L (ref 3.5–5.1)
SODIUM: 139 mmol/L (ref 135–145)
Total Bilirubin: 0.6 mg/dL (ref 0.3–1.2)
Total Protein: 7.8 g/dL (ref 6.5–8.1)

## 2018-11-18 LAB — TSH: TSH: 1.593 u[IU]/mL (ref 0.350–4.500)

## 2018-11-18 NOTE — Progress Notes (Signed)
ADVANCED HF CLINIC NOTE Primary HF: Dr. Gala Romney   HPI: Misty Gray is a 67 y/o woman with history of morbid obesity, OSA, chronic atrial fibrillation with tachy-brady syndrome s/p PPM, frequent PVCs, systolic HF due to NICM.  Admitted for ADHF in 10/17. Cath with normal coronary arteries and elevated filling pressures. Echo 10/17 EF 30-35% (previously 35-40%). 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 (She has 81% RV pacing and 15-20 PVCs per minute). Started on po amio with suppression of PVCs. Diuresed 41 pounds. Discharge weight 197.   Admitted 12/14 through 12/06/2016 with marked volume overload. Diuresed with IV lasix + metolazone. Discharge weight was 195 pounds.Transitioned to torsemide 40 mg in am and 20 mg in pm.   She presents today for regular follow up. Last seen 12/22/17. Weight 178 at that visit. Now up to 202, which is 40 lbs from November 2018. She is not active. She "nibbles" on snacks throughout the day. Denies SOB walking around the house, but does get SOB with more than ADLs. Denies lightheadedness or dizziness. Taking all medicines as directed. Has occasional orthopnea. Has a CPAP machine, but hasn't used it in months. Has occasional palpitations.   Echo 07/2017 LVEF 25-30%, Trivial AI, Mod MR, Severe LAE, Mild RV dilation, Mild RAE, PA peak pressure 31 mm Hg.  R/LHC 10/03/16 Ao = 94/58 (72) LV = 104/20 RA = 21 RV = 65/4/19 PA = 65/21 (38) PCW = 23 Fick cardiac output/index = 6.1/2.9 PVR = 2.5 WU FA sat = 97%  PA sat = 69%, 70%  Assessment: 1. Normal coronary arteries 2. NICM with EF 30-35% by echo 3. Persistently elevated biventricular pressures  Review of systems complete and found to be negative unless listed in HPI.    Past Medical History:  Diagnosis Date  . Abnormal CT of the head 12/16/1984   R parietal atrophy  . Allergic rhinitis, cause unspecified   . Altered mental status 11/28/2016  . Anemia   . Arthritis    "hands and knees" (09/06/2015)  .  Atrial fibrillation (HCC)   . Cellulitis 09/07/2015  . Cellulitis and abscess of leg 09/2016   bilateral  . CHF (congestive heart failure) (HCC)   . Diaphragmatic hernia without mention of obstruction or gangrene   . Dyspnea   . Exertional dyspnea 06/22/12  . Family history of adverse reaction to anesthesia    "daughter fights w/them; just can't relax during OR; stay awake during the OR"  . Fatigue 06/22/2012  . Female stress incontinence   . GERD (gastroesophageal reflux disease)   . Grand mal seizure (HCC)   . H/O hiatal hernia    two removed  . Heart murmur   . High cholesterol   . History of blood transfusion 1956   "related to nose bleed"  . Hypertension   . Influenza A 01/11/2014  . Lichenification and lichen simplex chronicus   . Migraine    "when I was a teenager"  . Morbid obesity (HCC)   . Nonischemic cardiomyopathy (HCC)    EF has normalized-repeat Pending   . On home oxygen therapy    "2L when I'm asleep in bed" (09/06/2015)  . OSA on CPAP   . Pacemaker  st judes    Patient states "this is my third pacemaker"  . Pneumonia 2004; 2011; 11/2011  . Seizures (HCC) since 1981   "can hear you talking but sounds like you are in big tunnel; have them often if not taking RX; last one was  06/17/12" (09/06/2015)  . Shortness of breath 10/24/2010   Qualifier: Diagnosis of  By: [place], Bonnie    . Sick sinus syndrome (HCC)    WITH PRIOR DDD PACEMAKER IMPLANTATION  . Somnolence   . Stroke [facility]) 1988   "mouth drawed real bad on my left side; found out it was a seizure"  . Syncope and collapse 06/22/12   "hit forehead and left knee"; denies loss of consciousness  . Unspecified venous (peripheral) insufficiency     Current Outpatient Medications  Medication Sig Dispense Refill  . amiodarone (PACERONE) 200 MG tablet TAKE 1 TABLET BY MOUTH ONCE DAILY. 28 tablet 4  . cholecalciferol (VITAMIN D) 1000 units tablet Take 1 tablet (1,000 Units total) by mouth daily. 90 tablet 3  .  fluticasone (FLONASE) 50 MCG/ACT nasal spray Place 2 sprays into both nostrils daily as needed. For congestion. 16 g 1  . nystatin cream (MYCOSTATIN) Apply to affected area 2 times daily for 3 weeks. 30 g 1  . omeprazole (PRILOSEC) 20 MG capsule TAKE (1) CAPSULE BY MOUTH ONCE DAILY. 90 capsule 1  . PHENobarbital (LUMINAL) 32.4 MG tablet TAKE (2) TABLETS BY MOUTH TWICE DAILY. (MORNING AND BEDTIME) 112 tablet 0  . potassium chloride (K-DUR,KLOR-CON) 10 MEQ tablet TAKE 4 TABLETS BY MOUTH THREE TIMES DAILY (MORNING, NOON, AND NIGHT) 336 tablet 2  . SAVAYSA 60 MG TABS tablet TAKE 1 TABLET BY MOUTH ONCE DAILY. 28 tablet 2  . torsemide (DEMADEX) 20 MG tablet TAKE (2) TABLETS BY MOUTH TWICE DAILY. 112 tablet 0  . triamcinolone ointment (KENALOG) 0.5 % Apply 1 application topically 3 (three) times daily. 60 g 0   No current facility-administered medications for this encounter.     Allergies  Allergen Reactions  . Aspirin Hives and Rash  . Penicillins Rash and Other (See Comments)    Has patient had a PCN reaction causing immediate rash, facial/tongue/throat swelling, SOB or lightheadedness with hypotension: Yes Has patient had a PCN reaction causing severe rash involving mucus membranes or skin necrosis: No Has patient had a PCN reaction that required hospitalization No Has patient had a PCN reaction occurring within the last 10 years: Yes If all of the above answers are "NO", then may proceed with Cephalosporin use.   . Tape Other (See Comments)    NO PAPER TAPE-MUST BE MEDICAL TAPE - unknown reaction NO PAPER TAPE-MUST BE MEDICAL TAPE - unknown reaction  . Wool Alcohol [Lanolin] Hives  . Amoxicillin Rash  . Ampicillin Itching and Rash      Social History   Socioeconomic History  . Marital status: Married    Spouse name: [name]  . Number of children: 4  . Years of education: 34  . Highest education level: Not on file  Occupational History  . Occupation: disabled  Social Needs  .  Financial resource strain: Not on file  . Food insecurity:    Worry: Not on file    Inability: Not on file  . Transportation needs:    Medical: Not on file    Non-medical: Not on file  Tobacco Use  . Smoking status: Former Smoker    Packs/day: 1.00    Years: 7.00    Pack years: 7.00    Types: Cigarettes    Last attempt to quit: 03/16/1969    Years since quitting: 49.7  . Smokeless tobacco: Never Used  Substance and Sexual Activity  . Alcohol use: No  . Drug use: No  . Sexual activity: Not Currently  Birth control/protection: Post-menopausal  Lifestyle  . Physical activity:    Days per week: Not on file    Minutes per session: Not on file  . Stress: Not on file  Relationships  . Social connections:    Talks on phone: Not on file    Gets together: Not on file    Attends religious service: Not on file    Active member of club or organization: Not on file    Attends meetings of clubs or organizations: Not on file    Relationship status: Not on file  . Intimate partner violence:    Fear of current or ex partner: Not on file    Emotionally abused: Not on file    Physically abused: Not on file    Forced sexual activity: Not on file  Other Topics Concern  . Not on file  Social History Narrative   Lives with husband [name] lives with them as do his 2 children   Daughter, [name], and her 3 children live in [place] -  [name] is incarcerated   Nephew, Evette Diclemente lives with them   Daughter, [name], in [name] [address]   Moved June 2013 to a better home on [facility].      Lives with husband, son, grandson, granddaughter, nephew.    Family History  Problem Relation Age of Onset  . Stroke Father        died from CAD?  Marland Kitchen Heart disease Father   . Cancer Mother   . Diabetes Son        Type 1  . Sleep apnea Son   . Cancer Sister        cervical  . Hypertension Sister   . Hypertension Brother   . Rheum  arthritis Daughter        Also PGM, PGGM   Vitals:   11/18/18 1153  BP: 120/82  Pulse: 62  SpO2: 95%  Weight: 91.9 kg (202 lb 9.6 oz)   Wt Readings from Last 3 Encounters:  11/18/18 91.9 kg (202 lb 9.6 oz)  05/15/18 81.5 kg (179 lb 9.6 oz)  05/05/18 82.6 kg (182 lb)   PHYSICAL EXAM:  General: Elderly female. NAD.  HEENT: Normal Neck: Supple. JVP 6-7 cm. Carotids 2+ bilat; no bruits. No thyromegaly or nodule noted. Cor: PMI nondisplaced. Huston Foley, regular. No M/G/R noted Lungs: CTAB, normal effort. Abdomen: Soft, non-tender, non-distended, no HSM. No bruits or masses. +BS  Extremities: No cyanosis, clubbing, or rash. No ankle edema. Chronic venous stasis changes.  Neuro: Alert & orientedx3, cranial nerves grossly intact. moves all 4 extremities w/o difficulty. Affect pleasant   ASSESSMENT & PLAN: 1. Chronic systolic HF due to NICM. ? PVC induced vs RV pacing. Echo 12/06/2016 EF 10-15%. Cath 10/17 normal coronary arteries.  Echo 02/2017 with LVEF 15-20% despite PVC suppression. Echo 07/2017 EF 25-30%.  - Echo 07/2017 LVEF 25-30%, Trivial AI, Mod MR, Severe LAE, Mild RV dilation, Mild RAE, PA peak pressure 31 mm Hg. - Repeat Echo. - NYHA III symptoms, but confounded by obesity and general inactivity.  - Volume status stable on exam.   - Continue toresemide 40mg  BID. CMET today.  - Unable to tolerate beta blocker. - Has failed losartan 12.5 mg hs due to hypotension.  - Reinforced dietary and fluid restrictions.   2. Frequent PVCs - Suppressed. Continue Amio 200 mg daily.   - CMET and TSH  today.   3. Obesity  - Body mass index is 37.06 kg/m.  - She has gained about 40 lbs in the past year. Encouraged increased activity and diet. Encouraged silver sneaker if available in her new medicare plan.   4. Chronic AF with tachy-brady s/p pacemaker - Rate stable.  - Continue edoxaban. Denies bleeding.  - Follows with EP. Has not been sending PPM interrogations. States her outlets don't  work well at home, and her fuses continually shut off if she plugs in too much.   Encouraged weight loss. Repeat Echo. RTC 6 months, sooner with symptoms.  Graciella Freer, PA-C  12:06 PM   Patient seen and examined with the above-signed Advanced Practice Provider and/or Housestaff. I personally reviewed laboratory data, imaging studies and relevant notes. I independently examined the patient and formulated the important aspects of the plan. I have edited the note to reflect any of my changes or salient points. I have personally discussed the plan with the patient and/or family.  Stable from cardiac perspective. Volume status looks good. No CP. Unfortunately continues to be noncompliant with diet and activity suggestions and weight continues to climb. We talked to her at length today but doubt much will improve. Will repeat echo to see if EF has improved with suppression of PVCs. Check labs today.   Arvilla Meres, MD  7:00 PM

## 2018-11-18 NOTE — Patient Instructions (Signed)
Your physician has requested that you have an echocardiogram. Echocardiography is a painless test that uses sound waves to create images of your heart. It provides your doctor with information about the size and shape of your heart and how well your heart's chambers and valves are working. This procedure takes approximately one hour. There are no restrictions for this procedure.  YOU HAVE HAD LAB WORK DONE TODAY.  If abnormal we will call you

## 2018-11-27 ENCOUNTER — Ambulatory Visit (INDEPENDENT_AMBULATORY_CARE_PROVIDER_SITE_OTHER): Payer: Medicare Other | Admitting: Family Medicine

## 2018-11-27 ENCOUNTER — Other Ambulatory Visit: Payer: Self-pay

## 2018-11-27 ENCOUNTER — Encounter: Payer: Self-pay | Admitting: Family Medicine

## 2018-11-27 DIAGNOSIS — G40909 Epilepsy, unspecified, not intractable, without status epilepticus: Secondary | ICD-10-CM

## 2018-11-27 DIAGNOSIS — K439 Ventral hernia without obstruction or gangrene: Secondary | ICD-10-CM

## 2018-11-27 NOTE — Progress Notes (Signed)
Date of Visit: 11/27/2018   HPI:  Patient presents for routine follow up.  Weight management - wants to lose weight. Agreeable to seeing a nutritionist.  Seizure d/o - had a seizure a few wks ago because she accidentally missed her phenobarbital dose (the pill had gotten stuck in her pill packet). Generally has seizures a couple times a year if she misses a dose. Does not drive at all.  Hernia/skin irritation on stomach - saw surgeon at [facility] and was advised to follow up there but she has not due to getting a bill from them. Had area of mesh eroding through abdominal skin. This area still gets irritated. She understands she needs to follow up with the surgeon for this, and that medically there is not much I can do for it.  ROS: See HPI.  PMFSH: history of obesity, seizure disorder, systolic CHF, hypertension  PHYSICAL EXAM: BP 136/80   Pulse 64   Temp 97.9 F (36.6 C) (Oral)   Ht 5\' 2"  (1.575 m)   Wt 202 lb 6.4 oz (91.8 kg)   SpO2 99%   BMI 37.02 kg/m  Gen: no acute distress, pleasant, cooperative, very talkative HEENT: normocephalic, atraumatic, moist mucous membranes  Heart: regular rate and rhythm  Lungs: clear to auscultation bilaterally, normal work of breathing  Neuro: alert, speech normal Abdomen: chronic skin irritation on stomach without purulence  ASSESSMENT/PLAN:  Health maintenance:  -UTD  Morbid obesity (HCC) Gave phone # for Dr. Gerilyn Pilgrim for patient to call & schedule w/ nutritionist  Seizure disorder [facility]) Clearly needs phenobarbital as she seizes if misses a dose. Continue this medication.   Ventral hernia without obstruction or gangrene Advised need for follow up with surgeon. Gave phone # for surgery clinic so she can call & schedule.  FOLLOW UP: Follow up in 3 months with me for chronic medical issues  [place] J. Pollie Meyer, MD [facility]

## 2018-11-27 NOTE — Patient Instructions (Addendum)
For weight management: Call Dr. Gerilyn Pilgrim (our nutritionist) to set up an appointment. Her phone number is: [phone].   Surgeon's phone # [phone]  Follow up with me in 3 months  Be well, Dr. Pollie Meyer

## 2018-11-30 ENCOUNTER — Other Ambulatory Visit: Payer: Self-pay | Admitting: Family Medicine

## 2018-11-30 NOTE — Assessment & Plan Note (Signed)
Gave phone # for Dr. Gerilyn Pilgrim for patient to call & schedule w/ nutritionist

## 2018-11-30 NOTE — Assessment & Plan Note (Signed)
Advised need for follow up with surgeon. Gave phone # for surgery clinic so she can call & schedule.

## 2018-11-30 NOTE — Assessment & Plan Note (Signed)
Clearly needs phenobarbital as she seizes if misses a dose. Continue this medication.

## 2018-12-03 ENCOUNTER — Other Ambulatory Visit (HOSPITAL_COMMUNITY): Payer: Self-pay | Admitting: Internal Medicine

## 2018-12-16 DIAGNOSIS — G4733 Obstructive sleep apnea (adult) (pediatric): Secondary | ICD-10-CM | POA: Diagnosis not present

## 2018-12-23 ENCOUNTER — Ambulatory Visit: Payer: Medicare Other

## 2018-12-26 ENCOUNTER — Other Ambulatory Visit (HOSPITAL_COMMUNITY): Payer: Self-pay | Admitting: Internal Medicine

## 2018-12-29 ENCOUNTER — Other Ambulatory Visit: Payer: Self-pay | Admitting: Cardiology

## 2018-12-30 ENCOUNTER — Ambulatory Visit (HOSPITAL_COMMUNITY)
Admission: RE | Admit: 2018-12-30 | Discharge: 2018-12-30 | Disposition: A | Discharge status: Home / Self Care | Payer: Medicare Other | Source: Ambulatory Visit | Attending: Internal Medicine | Admitting: Internal Medicine

## 2018-12-30 DIAGNOSIS — I428 Other cardiomyopathies: Secondary | ICD-10-CM | POA: Diagnosis not present

## 2018-12-30 DIAGNOSIS — I495 Sick sinus syndrome: Secondary | ICD-10-CM | POA: Insufficient documentation

## 2018-12-30 DIAGNOSIS — I110 Hypertensive heart disease with heart failure: Secondary | ICD-10-CM | POA: Insufficient documentation

## 2018-12-30 DIAGNOSIS — I509 Heart failure, unspecified: Secondary | ICD-10-CM | POA: Insufficient documentation

## 2018-12-30 DIAGNOSIS — I4891 Unspecified atrial fibrillation: Secondary | ICD-10-CM | POA: Diagnosis not present

## 2018-12-30 DIAGNOSIS — E785 Hyperlipidemia, unspecified: Secondary | ICD-10-CM | POA: Insufficient documentation

## 2018-12-30 DIAGNOSIS — I519 Heart disease, unspecified: Secondary | ICD-10-CM | POA: Diagnosis not present

## 2018-12-30 MED ORDER — PERFLUTREN LIPID MICROSPHERE
1.0000 mL | INTRAVENOUS | Status: AC | PRN
  Administered 2018-12-30: 3 mL via INTRAVENOUS
  Filled 2018-12-30: qty 10

## 2018-12-30 NOTE — Progress Notes (Signed)
  Echocardiogram 2D Echocardiogram has been performed.  Celene Skeen 12/30/2018, 3:58 PM

## 2019-01-16 DIAGNOSIS — G4733 Obstructive sleep apnea (adult) (pediatric): Secondary | ICD-10-CM | POA: Diagnosis not present

## 2019-01-18 DIAGNOSIS — T148XXD Other injury of unspecified body region, subsequent encounter: Secondary | ICD-10-CM | POA: Diagnosis not present

## 2019-01-18 DIAGNOSIS — K439 Ventral hernia without obstruction or gangrene: Secondary | ICD-10-CM | POA: Diagnosis not present

## 2019-01-18 DIAGNOSIS — K469 Unspecified abdominal hernia without obstruction or gangrene: Secondary | ICD-10-CM | POA: Diagnosis not present

## 2019-01-26 ENCOUNTER — Other Ambulatory Visit: Payer: Self-pay | Admitting: Family Medicine

## 2019-02-14 DIAGNOSIS — R092 Respiratory arrest: Secondary | ICD-10-CM | POA: Diagnosis not present

## 2019-02-14 DIAGNOSIS — G4733 Obstructive sleep apnea (adult) (pediatric): Secondary | ICD-10-CM | POA: Diagnosis not present

## 2019-02-15 DIAGNOSIS — K439 Ventral hernia without obstruction or gangrene: Secondary | ICD-10-CM | POA: Diagnosis not present

## 2019-02-15 DIAGNOSIS — Z7901 Long term (current) use of anticoagulants: Secondary | ICD-10-CM | POA: Diagnosis not present

## 2019-02-15 DIAGNOSIS — Z950 Presence of cardiac pacemaker: Secondary | ICD-10-CM | POA: Diagnosis not present

## 2019-02-15 DIAGNOSIS — T8579XD Infection and inflammatory reaction due to other internal prosthetic devices, implants and grafts, subsequent encounter: Secondary | ICD-10-CM | POA: Diagnosis not present

## 2019-02-15 DIAGNOSIS — I482 Chronic atrial fibrillation, unspecified: Secondary | ICD-10-CM | POA: Diagnosis not present

## 2019-02-19 ENCOUNTER — Other Ambulatory Visit (HOSPITAL_COMMUNITY): Payer: Self-pay | Admitting: Internal Medicine

## 2019-02-19 ENCOUNTER — Other Ambulatory Visit: Payer: Self-pay | Admitting: Family Medicine

## 2019-03-04 ENCOUNTER — Telehealth: Payer: Self-pay | Admitting: Family Medicine

## 2019-03-04 NOTE — Telephone Encounter (Signed)
Pt is calling to see if see still needs to come in for her scheduled appointment on 03/08/19. Please let patient know. jw

## 2019-03-04 NOTE — Telephone Encounter (Signed)
Attempted to reach patient. No answer. LVM asking her to call back.  I am fine with patient postponing her appointment for another 1-2 months given the current COVID pandemic. If she has concerns she would like to speak with me about I am happy to call her back.  Latrelle Dodrill, MD

## 2019-03-04 NOTE — Telephone Encounter (Signed)
Pt called nurse line returning PCP call. Pt stated she is fine with moving apt, "its no emergency." Apt rescheduled for May. Pt does not need a call back from PCP.

## 2019-03-08 ENCOUNTER — Ambulatory Visit: Payer: Medicare Other | Admitting: Family Medicine

## 2019-03-17 DIAGNOSIS — G4733 Obstructive sleep apnea (adult) (pediatric): Secondary | ICD-10-CM | POA: Diagnosis not present

## 2019-03-17 DIAGNOSIS — R092 Respiratory arrest: Secondary | ICD-10-CM | POA: Diagnosis not present

## 2019-03-23 ENCOUNTER — Other Ambulatory Visit (HOSPITAL_COMMUNITY): Payer: Self-pay | Admitting: Internal Medicine

## 2019-04-16 DIAGNOSIS — G4733 Obstructive sleep apnea (adult) (pediatric): Secondary | ICD-10-CM | POA: Diagnosis not present

## 2019-04-16 DIAGNOSIS — R0902 Hypoxemia: Secondary | ICD-10-CM | POA: Diagnosis not present

## 2019-04-19 ENCOUNTER — Other Ambulatory Visit: Payer: Self-pay

## 2019-04-19 ENCOUNTER — Ambulatory Visit (INDEPENDENT_AMBULATORY_CARE_PROVIDER_SITE_OTHER): Payer: Medicare Other | Admitting: Family Medicine

## 2019-04-19 ENCOUNTER — Ambulatory Visit: Payer: Medicare Other | Admitting: Family Medicine

## 2019-04-19 VITALS — BP 110/70 | HR 59

## 2019-04-19 DIAGNOSIS — Z1211 Encounter for screening for malignant neoplasm of colon: Secondary | ICD-10-CM | POA: Diagnosis not present

## 2019-04-19 DIAGNOSIS — Z1239 Encounter for other screening for malignant neoplasm of breast: Secondary | ICD-10-CM | POA: Diagnosis not present

## 2019-04-19 DIAGNOSIS — Z0000 Encounter for general adult medical examination without abnormal findings: Secondary | ICD-10-CM

## 2019-04-19 NOTE — Assessment & Plan Note (Signed)
Up to date on routine labs Screening mammogram ordered Cologuard ordered as patient is poor candidate for colonoscopy

## 2019-04-19 NOTE — Patient Instructions (Signed)
It was a pleasure seeing you today.   Today we discussed your wellness exam  For your screening tests: please complete the cologuard at home to screen for colon cancer. The breast center will contact you for a mammogram.   Please follow up in 1 year for an annual wellness exam or sooner if symptoms persist or worsen. Please call the clinic immediately if you have any concerns.   Our clinic's number is [phone]. Please call with questions or concerns.   Please go to the emergency room if you have chest pain or shortness of breath.   Thank you,  Oralia Manis, DO

## 2019-04-19 NOTE — Progress Notes (Signed)
Subjective:  CC -- Annual Physical; With complaints of none  Pt reports she is doing well. Has not complaints or questions.   General Healthcare: Medication Compliance: yes  Cardiac Risk: The 10-year ASCVD risk score Denman George DC Jr., et al., 2013) is: 7.5%   Values used to calculate the score:     Age: 68 years     Sex: Female     Is Non-Hispanic African American: No     Diabetic: No     Tobacco smoker: No     Systolic Blood Pressure: 110 mmHg     Is BP treated: Yes     HDL Cholesterol: 80 mg/dL     Total Cholesterol: 177 mg/dL Aspirin: no   Dx Hypertension: yes  Dx Hyperlipidemia: no  Diabetes: no  Dx Obesity: yes, class II   Weight Loss: no   Physical Activity: yes, not walking as much anymore due to COVID concerns  Urinary Incontinence: no    Social: Driving: no, son is caretaker and drives her to doctors  Alcohol Use: no  Tobacco Use: no   - Interested in Quitting: N/A  Other Drugs: no  Independence: can do ADLs independently, needs son to drive her places  Depression: no   - PHQ9 score: 2 Support and Life at Home: yes  Advanced Directives: yes, but needs to revise. Has a living will.    Cancer:  Colorectal >> Colonoscopy: no, has been told she is a poor endoscopy candidate due to other medical problems.   Lung >> Tobacco Use: no    - If so, previous Low-Dose CT screen: no  Breast >> Mammogram: is due   Cervical/Endometrial >>  - Postmenopausal: yes - Hysterectomy: no  - Vaginal Bleeding: no Skin >> Suspicious lesions: no   Other: Osteoporosis: no   Flu Vaccine: no  Pneumonia Vaccine: yes    Past Medical History Patient Active Problem List   Diagnosis Date Noted  . Abdominal wall hernia 11/05/2017  . Anticoagulated 11/05/2017  . Rash and nonspecific skin eruption 09/15/2017  . Ventral hernia without obstruction or gangrene 08/15/2017  . Skin irritation 07/23/2017  . Chronic venous stasis dermatitis 12/26/2016  . Urinary tract infection without  hematuria   . Frequent PVCs   . Hypokalemia   . Housing problems 11/23/2016  . Chronic atrial fibrillation 11/01/2016  . Hypotension 10/31/2016  . Special screening for malignant neoplasms, colon 08/12/2016  . Heme positive stool 08/09/2016  . Intertrigo 08/09/2016  . Chronic anticoagulation 04/26/2016  . Chronic venous insufficiency   . Preventative health care 10/05/2014  . Breast mass, left 10/05/2014  . Osteopenia 09/15/2014  . Hypertension 01/04/2014  . Chronic systolic dysfunction of left ventricle 05/05/2013  . At high risk for falls 02/11/2013  . Vitamin D deficiency 07/15/2012  . Sinoatrial node dysfunction (HCC) 05/08/2011  . Pacemaker 08/10/2009  . Allergic rhinitis 05/31/2008  . Morbid obesity (HCC) 02/12/2007  . GASTROESOPHAGEAL REFLUX, NO ESOPHAGITIS 02/12/2007  . Female stress incontinence 02/12/2007  . Seizure disorder (HCC) 02/12/2007  . OSA (obstructive sleep apnea) 02/12/2007    Medications- reviewed and updated Current Outpatient Medications  Medication Sig Dispense Refill  . amiodarone (PACERONE) 200 MG tablet TAKE 1 TABLET BY MOUTH ONCE DAILY. 30 tablet 3  . cholecalciferol (VITAMIN D) 1000 units tablet Take 1 tablet (1,000 Units total) by mouth daily. 90 tablet 3  . fluticasone (FLONASE) 50 MCG/ACT nasal spray Place 2 sprays into both nostrils daily as needed. For congestion. 16 g  1  . nystatin cream (MYCOSTATIN) Apply to affected area 2 times daily for 3 weeks. 30 g 1  . omeprazole (PRILOSEC) 20 MG capsule TAKE (1) CAPSULE BY MOUTH ONCE DAILY. 90 capsule 0  . PHENobarbital (LUMINAL) 32.4 MG tablet TAKE (2) TABLETS BY MOUTH TWICE DAILY. (MORNING AND BEDTIME) 112 tablet 3  . potassium chloride (K-DUR,KLOR-CON) 10 MEQ tablet TAKE 4 TABLETS BY MOUTH THREE TIMES DAILY (MORNING, NOON, AND NIGHT) 336 tablet 5  . SAVAYSA 60 MG TABS tablet TAKE 1 TABLET BY MOUTH ONCE DAILY. 30 tablet 5  . torsemide (DEMADEX) 20 MG tablet TAKE (2) TABLETS BY MOUTH TWICE DAILY. 112  tablet 10  . triamcinolone ointment (KENALOG) 0.5 % Apply 1 application topically 3 (three) times daily. 60 g 0   No current facility-administered medications for this visit.     Objective: BP 110/70   Pulse (!) 59   SpO2 96%  Gen: NAD, alert, cooperative with exam HEENT: NCAT, EOMI, PERRL CV: RRR, good S1/S2, no murmur Resp: CTABL, no wheezes, non-labored Abd: Soft, Non Tender, Non Distended, BS present, no guarding or organomegaly Genital Exam: not done Ext: No edema, warm Neuro: Alert and oriented, No gross deficits, 5/5 muscle strength. Diminished grip strength in right hand 2/2 rheumatoid arthritis    Assessment/Plan:  Preventative health care Up to date on routine labs Screening mammogram ordered Cologuard ordered as patient is poor candidate for colonoscopy    Orders Placed This Encounter  Procedures  . MM Digital Screening    Standing Status:   Future    Standing Expiration Date:   06/18/2020    Order Specific Question:   Reason for Exam (SYMPTOM  OR DIAGNOSIS REQUIRED)    Answer:   screening mammogram    Order Specific Question:   Preferred imaging location?    Answer:   [facility]  . Cologuard    No orders of the defined types were placed in this encounter.    Oralia Manis, DO, PGY-2 04/19/2019 3:53 PM

## 2019-04-20 ENCOUNTER — Other Ambulatory Visit: Payer: Self-pay | Admitting: Family Medicine

## 2019-04-20 ENCOUNTER — Telehealth: Payer: Self-pay | Admitting: Family Medicine

## 2019-04-20 NOTE — Telephone Encounter (Signed)
Attempted to call patient regarding cologuard. Unable to reach patient so VM was left to call clinic. Patient has had a positive cologuard in the past so is unable to do this in the future, is now only a candidate for colonoscopy. Patient will need colonoscopy for colon cancer screening. If patient agreeable I am happy to put this order in.   If patient calls back please give her above message.   Orpah Clinton, PGY-2 [facility] 04/20/2019 1:55 PM

## 2019-05-17 DIAGNOSIS — R0902 Hypoxemia: Secondary | ICD-10-CM | POA: Diagnosis not present

## 2019-05-17 DIAGNOSIS — G4733 Obstructive sleep apnea (adult) (pediatric): Secondary | ICD-10-CM | POA: Diagnosis not present

## 2019-06-14 ENCOUNTER — Other Ambulatory Visit: Payer: Self-pay

## 2019-06-14 ENCOUNTER — Ambulatory Visit
Admission: RE | Admit: 2019-06-14 | Discharge: 2019-06-14 | Disposition: A | Discharge status: Home / Self Care | Payer: Medicare Other | Source: Ambulatory Visit | Attending: Family Medicine | Admitting: Family Medicine

## 2019-06-14 DIAGNOSIS — Z1239 Encounter for other screening for malignant neoplasm of breast: Secondary | ICD-10-CM

## 2019-06-14 DIAGNOSIS — Z1231 Encounter for screening mammogram for malignant neoplasm of breast: Secondary | ICD-10-CM | POA: Diagnosis not present

## 2019-06-16 ENCOUNTER — Other Ambulatory Visit: Payer: Self-pay

## 2019-06-16 DIAGNOSIS — G4733 Obstructive sleep apnea (adult) (pediatric): Secondary | ICD-10-CM | POA: Diagnosis not present

## 2019-06-16 DIAGNOSIS — R0902 Hypoxemia: Secondary | ICD-10-CM | POA: Diagnosis not present

## 2019-06-17 ENCOUNTER — Other Ambulatory Visit: Payer: Self-pay | Admitting: Family Medicine

## 2019-06-17 MED ORDER — OMEPRAZOLE 20 MG PO CPDR: DELAYED_RELEASE_CAPSULE | ORAL | 1 refills | Status: DC

## 2019-06-17 NOTE — Telephone Encounter (Signed)
Please ask patient to schedule a routine follow up with me at her convenience. Virtual appointment is fine if she prefers that  Thanks!  [name], MD

## 2019-06-18 ENCOUNTER — Other Ambulatory Visit (HOSPITAL_COMMUNITY): Payer: Self-pay | Admitting: Internal Medicine

## 2019-06-18 ENCOUNTER — Other Ambulatory Visit: Payer: Self-pay | Admitting: Family Medicine

## 2019-06-18 ENCOUNTER — Other Ambulatory Visit: Payer: Self-pay | Admitting: Cardiology

## 2019-06-21 NOTE — Telephone Encounter (Signed)
Attempted to call pt, no answer. Will try again later. Berk Pilot, CMA  

## 2019-06-22 ENCOUNTER — Telehealth: Payer: Self-pay

## 2019-06-22 NOTE — Telephone Encounter (Signed)
Left message for patient to schedule an appointment with Dr. Ardelia Mems.  Ozella Almond, [place]

## 2019-07-09 ENCOUNTER — Other Ambulatory Visit: Payer: Self-pay | Admitting: Family Medicine

## 2019-07-17 DIAGNOSIS — G4733 Obstructive sleep apnea (adult) (pediatric): Secondary | ICD-10-CM | POA: Diagnosis not present

## 2019-07-17 DIAGNOSIS — R0902 Hypoxemia: Secondary | ICD-10-CM | POA: Diagnosis not present

## 2019-08-02 ENCOUNTER — Encounter: Payer: Self-pay | Admitting: Family Medicine

## 2019-08-02 ENCOUNTER — Other Ambulatory Visit: Payer: Self-pay

## 2019-08-02 ENCOUNTER — Ambulatory Visit (INDEPENDENT_AMBULATORY_CARE_PROVIDER_SITE_OTHER): Payer: Medicare Other | Admitting: Family Medicine

## 2019-08-02 VITALS — BP 118/80 | HR 77 | Wt 226.8 lb

## 2019-08-02 DIAGNOSIS — I519 Heart disease, unspecified: Secondary | ICD-10-CM

## 2019-08-02 DIAGNOSIS — G40909 Epilepsy, unspecified, not intractable, without status epilepticus: Secondary | ICD-10-CM | POA: Diagnosis not present

## 2019-08-02 DIAGNOSIS — I5020 Unspecified systolic (congestive) heart failure: Secondary | ICD-10-CM | POA: Diagnosis not present

## 2019-08-02 DIAGNOSIS — Z7901 Long term (current) use of anticoagulants: Secondary | ICD-10-CM | POA: Diagnosis not present

## 2019-08-02 DIAGNOSIS — K219 Gastro-esophageal reflux disease without esophagitis: Secondary | ICD-10-CM

## 2019-08-02 NOTE — Patient Instructions (Signed)
Checking labs today Please schedule a follow up visit with Dr. Haroldine Laws. I think you likely need to increase your fluid pills  For weight management: Call Dr. Jenne Campus (our nutritionist) to set up an appointment. Her phone number is: [phone].   Be well, Dr. Ardelia Mems

## 2019-08-02 NOTE — Progress Notes (Signed)
Date of Visit: 08/02/2019   HPI:  Misty Gray presents for routine follow up.  GERD - taking prilosec 20mg  daily. Works well. Symptoms are worse when she eats spicy foods.   Seizure - compliant with phenobarbital. She did miss a dose the other week and had a resultant seizure. Has been on phenobarbital for a long time, and reports no other seizure medications work for her.  CHF - followed by Dr. Haroldine Laws. Has noticed herself getting more shortness of breath with ambulation lately, has also noticed increased lower extremity edema and weight gain. Taking torsemide 40mg  twice daily. Says cardiologist has told her in the past she could go up to three pills twice daily but did not want her to be on that regimen regularly. No chest pain.   afib - taking savaysa as anticoagulant. Has had nosebleeds, last 15-20 mins and then stop. Knows to go to ED if having persistent nosebleed.  Obesity - has been much more immobile since COVID pandemic started. Gained about 20lb. Eating more. Wants to lose weight.  ROS: See HPI.  [place]: history of obesity, GERD, seizures, systolic CHF, hypertension, afib on anticoagulation, venous stasis dermatitis  PHYSICAL EXAM: BP 118/80   Pulse 77   Wt 226 lb 12.8 oz (102.9 kg)   SpO2 98%   BMI 41.48 kg/m  Gen: no acute distress, pleasant, cooperative, well appearing HEENT: normocephalic, atraumatic  Heart: normal rate, no murmur Lungs: clear to auscultation bilaterally, normal work of breathing  Neuro: alert, grossly nonfocal, speech normal Ext: 2-3+ pitting edema bilateral lower extremities, no erythema or excessive warmth  ASSESSMENT/PLAN:  Morbid obesity ([place]) Has gained weight during pandemic due to inactivity, may also be fluid related from CHF (see separate A/P for that issue). Gave phone # for Dr. Jenne Campus. Would benefit from seeing nutritionist.  Seizure disorder ([place]) Well controlled on phenobarbital - must continue on this or else has seizures with missed  doses.  Chronic systolic dysfunction of left ventricle Increasing shortness of breath with exertion and lower extremity edema, in setting of weight gain over the last several months. Symptoms gradual in onset, not acute. No hypoxia with ambulation today (remained 93% and above while exerting on room air). Discussed with patient that I think she likely needs higher dose of torsemide, she seems reluctant to make those changes. Encouraged her to schedule appointment with cardiologist Dr. Haroldine Laws to discuss further. Will check labs today - CBC, CMET, BNP to guide further management.  Chronic anticoagulation Advised going to ED if has nosebleed that won't stop. Check CBC today with labs.  GASTROESOPHAGEAL REFLUX, NO ESOPHAGITIS Well controlled. Continue current regimen.   FOLLOW UP: Follow up in 3 months with me for routine issues Schedule with CHF clinic  Misty Gray, [place]

## 2019-08-03 LAB — CMP14+EGFR
ALT: 12 IU/L (ref 0–32)
AST: 20 IU/L (ref 0–40)
Albumin/Globulin Ratio: 1 — ABNORMAL LOW (ref 1.2–2.2)
Albumin: 3.7 g/dL — ABNORMAL LOW (ref 3.8–4.8)
Alkaline Phosphatase: 149 IU/L — ABNORMAL HIGH (ref 39–117)
BUN/Creatinine Ratio: 17 (ref 12–28)
BUN: 18 mg/dL (ref 8–27)
Bilirubin Total: 0.6 mg/dL (ref 0.0–1.2)
CO2: 23 mmol/L (ref 20–29)
Calcium: 9.3 mg/dL (ref 8.7–10.3)
Chloride: 104 mmol/L (ref 96–106)
Creatinine, Ser: 1.04 mg/dL — ABNORMAL HIGH (ref 0.57–1.00)
GFR calc Af Amer: 63 mL/min/{1.73_m2} (ref >59)
GFR calc non Af Amer: 55 mL/min/{1.73_m2} — ABNORMAL LOW (ref >59)
Globulin, Total: 3.7 g/dL (ref 1.5–4.5)
Glucose: 91 mg/dL (ref 65–99)
Potassium: 4.1 mmol/L (ref 3.5–5.2)
Sodium: 142 mmol/L (ref 134–144)
Total Protein: 7.4 g/dL (ref 6.0–8.5)

## 2019-08-03 LAB — CBC
Hematocrit: 41.4 % (ref 34.0–46.6)
Hemoglobin: 13.9 g/dL (ref 11.1–15.9)
MCH: 31 pg (ref 26.6–33.0)
MCHC: 33.6 g/dL (ref 31.5–35.7)
MCV: 92 fL (ref 79–97)
Platelets: 115 10*3/uL — ABNORMAL LOW (ref 150–450)
RBC: 4.49 x10E6/uL (ref 3.77–5.28)
RDW: 12.2 % (ref 11.7–15.4)
WBC: 5.3 10*3/uL (ref 3.4–10.8)

## 2019-08-03 LAB — BRAIN NATRIURETIC PEPTIDE: BNP: 988.1 pg/mL — ABNORMAL HIGH (ref 0.0–100.0)

## 2019-08-03 NOTE — Assessment & Plan Note (Signed)
Well-controlled.  Continue current regimen. 

## 2019-08-03 NOTE — Assessment & Plan Note (Signed)
Advised going to ED if has nosebleed that won't stop. Check CBC today with labs.

## 2019-08-03 NOTE — Assessment & Plan Note (Signed)
Has gained weight during pandemic due to inactivity, may also be fluid related from CHF (see separate A/P for that issue). Gave phone # for Dr. Jenne Campus. Would benefit from seeing nutritionist.

## 2019-08-03 NOTE — Assessment & Plan Note (Addendum)
Increasing shortness of breath with exertion and lower extremity edema, in setting of weight gain over the last several months. Symptoms gradual in onset, not acute. No hypoxia with ambulation today (remained 93% and above while exerting on room air). Discussed with patient that I think she likely needs higher dose of torsemide, she seems reluctant to make those changes. Encouraged her to schedule appointment with cardiologist Dr. Haroldine Laws to discuss further. Will check labs today - CBC, CMET, BNP to guide further management.

## 2019-08-03 NOTE — Assessment & Plan Note (Signed)
Well controlled on phenobarbital - must continue on this or else has seizures with missed doses.

## 2019-08-05 ENCOUNTER — Other Ambulatory Visit: Payer: Self-pay | Admitting: Family Medicine

## 2019-08-06 ENCOUNTER — Telehealth (HOSPITAL_COMMUNITY): Payer: Self-pay | Admitting: *Deleted

## 2019-08-06 NOTE — Telephone Encounter (Signed)
Pt left vm requesting an appt. I called pt back to schedule no answer/left vm for pt to return call.

## 2019-08-17 DIAGNOSIS — R0902 Hypoxemia: Secondary | ICD-10-CM | POA: Diagnosis not present

## 2019-08-17 DIAGNOSIS — G4733 Obstructive sleep apnea (adult) (pediatric): Secondary | ICD-10-CM | POA: Diagnosis not present

## 2019-09-06 ENCOUNTER — Other Ambulatory Visit: Payer: Self-pay | Admitting: Family Medicine

## 2019-09-07 ENCOUNTER — Other Ambulatory Visit (HOSPITAL_COMMUNITY): Payer: Self-pay | Admitting: Internal Medicine

## 2019-09-07 ENCOUNTER — Other Ambulatory Visit: Payer: Self-pay | Admitting: Cardiology

## 2019-09-16 DIAGNOSIS — G4733 Obstructive sleep apnea (adult) (pediatric): Secondary | ICD-10-CM | POA: Diagnosis not present

## 2019-09-16 DIAGNOSIS — R0902 Hypoxemia: Secondary | ICD-10-CM | POA: Diagnosis not present

## 2019-09-29 ENCOUNTER — Emergency Department (HOSPITAL_COMMUNITY)
Admission: EM | Admit: 2019-09-29 | Discharge: 2019-09-29 | Disposition: A | Discharge status: Home / Self Care | Payer: Medicare Other | Attending: Emergency Medicine | Admitting: Emergency Medicine

## 2019-09-29 ENCOUNTER — Emergency Department (HOSPITAL_COMMUNITY): Payer: Medicare Other

## 2019-09-29 ENCOUNTER — Encounter (HOSPITAL_COMMUNITY): Payer: Self-pay | Admitting: Emergency Medicine

## 2019-09-29 ENCOUNTER — Other Ambulatory Visit: Payer: Self-pay

## 2019-09-29 DIAGNOSIS — Y999 Unspecified external cause status: Secondary | ICD-10-CM | POA: Diagnosis not present

## 2019-09-29 DIAGNOSIS — Z79899 Other long term (current) drug therapy: Secondary | ICD-10-CM | POA: Diagnosis not present

## 2019-09-29 DIAGNOSIS — W010XXA Fall on same level from slipping, tripping and stumbling without subsequent striking against object, initial encounter: Secondary | ICD-10-CM | POA: Diagnosis not present

## 2019-09-29 DIAGNOSIS — Y929 Unspecified place or not applicable: Secondary | ICD-10-CM | POA: Diagnosis not present

## 2019-09-29 DIAGNOSIS — I110 Hypertensive heart disease with heart failure: Secondary | ICD-10-CM | POA: Diagnosis not present

## 2019-09-29 DIAGNOSIS — Y9301 Activity, walking, marching and hiking: Secondary | ICD-10-CM | POA: Insufficient documentation

## 2019-09-29 DIAGNOSIS — Z87891 Personal history of nicotine dependence: Secondary | ICD-10-CM | POA: Insufficient documentation

## 2019-09-29 DIAGNOSIS — I509 Heart failure, unspecified: Secondary | ICD-10-CM | POA: Insufficient documentation

## 2019-09-29 DIAGNOSIS — M545 Low back pain, unspecified: Secondary | ICD-10-CM

## 2019-09-29 DIAGNOSIS — W19XXXA Unspecified fall, initial encounter: Secondary | ICD-10-CM

## 2019-09-29 DIAGNOSIS — Z950 Presence of cardiac pacemaker: Secondary | ICD-10-CM | POA: Insufficient documentation

## 2019-09-29 DIAGNOSIS — R519 Headache, unspecified: Secondary | ICD-10-CM | POA: Insufficient documentation

## 2019-09-29 DIAGNOSIS — S3992XA Unspecified injury of lower back, initial encounter: Secondary | ICD-10-CM | POA: Diagnosis not present

## 2019-09-29 DIAGNOSIS — I6782 Cerebral ischemia: Secondary | ICD-10-CM | POA: Diagnosis not present

## 2019-09-29 NOTE — ED Provider Notes (Signed)
[facility] EMERGENCY DEPARTMENT Provider Note   CSN: [id number] Arrival date & time: 09/29/19  1108     History   Chief Complaint Chief Complaint  Patient presents with   Fall    HPI Misty Gray is a 68 y.o. female.     HPI   Pt is a 68 y/o female with a h/o anemia, afib (on chronic Savaysa), chf, gerd, seizures, gerd, sinus sick syndrome s/p pacemaker who presents to the ED today for eval after a fall. Pt states that she had a mechanical fall 2 weeks ago. States she was walking in her house in the dark and got her feet tangled up and fall backwards onto the ground. States she hit her back   States she did not hit her head and that she caught herself when she fell. States she hit her left shoulder and her back. Since the fall her shoulder pain has improved but she is still having some pain in her back. She had some ecchymosis to her back after the fall. She decided to come to the ED today because her son wanted her to be evaluated.   Pt denies any numbness/tingling/weakness to the BLE. Denies saddle anesthesia. Denies loss of control of bowels or bladder. No urinary retention. No fevers.    Past Medical History:  Diagnosis Date   Abnormal CT of the head 12/16/1984   R parietal atrophy   Allergic rhinitis, cause unspecified    Altered mental status 11/28/2016   Anemia    Arthritis    "hands and knees" (09/06/2015)   Atrial fibrillation (HCC)    Cellulitis 09/07/2015   Cellulitis and abscess of leg 09/2016   bilateral   CHF (congestive heart failure) (HCC)    Diaphragmatic hernia without mention of obstruction or gangrene    Dyspnea    Exertional dyspnea 06/22/12   Family history of adverse reaction to anesthesia    "daughter fights w/them; just can't relax during OR; stay awake during the OR"   Fatigue 06/22/2012   Female stress incontinence    GERD (gastroesophageal reflux disease)    Grand mal seizure (HCC)    H/O hiatal hernia    two removed   Heart murmur    High cholesterol    History of blood transfusion 1956   "related to nose bleed"   Hypertension    Influenza A 01/11/2014   Lichenification and lichen simplex chronicus    Migraine    "when I was a teenager"   Morbid obesity (HCC)    Nonischemic cardiomyopathy (HCC)    EF has normalized-repeat Pending    On home oxygen therapy    "2L when I'm asleep in bed" (09/06/2015)   OSA on CPAP    Pacemaker  st judes    Patient states "this is my third pacemaker"   Pneumonia 2004; 2011; 11/2011   Seizures (HCC) since 1981   "can hear you talking but sounds like you are in big tunnel; have them often if not taking RX; last one was 06/17/12" (09/06/2015)   Shortness of breath 10/24/2010   Qualifier: Diagnosis of  By: [place], Bonnie     Sick sinus syndrome (HCC)    WITH PRIOR DDD PACEMAKER IMPLANTATION   Somnolence    Stroke (HCC) 1988   "mouth drawed real bad on my left side; found out it was a seizure"   Syncope and collapse 06/22/12   "hit forehead and left knee"; denies loss of consciousness  Unspecified venous (peripheral) insufficiency     Patient Active Problem List   Diagnosis Date Noted   Abdominal wall hernia 11/05/2017   Anticoagulated 11/05/2017   Rash and nonspecific skin eruption 09/15/2017   Ventral hernia without obstruction or gangrene 08/15/2017   Skin irritation 07/23/2017   Chronic venous stasis dermatitis 12/26/2016   Urinary tract infection without hematuria    Frequent PVCs    Hypokalemia    Housing problems 11/23/2016   Chronic atrial fibrillation (HCC) 11/01/2016   Hypotension 10/31/2016   Special screening for malignant neoplasms, colon 08/12/2016   Heme positive stool 08/09/2016   Intertrigo 08/09/2016   Chronic anticoagulation 04/26/2016   Chronic venous insufficiency    Preventative health care 10/05/2014   Breast mass, left 10/05/2014   Osteopenia 09/15/2014   Hypertension 01/04/2014     Chronic systolic dysfunction of left ventricle 05/05/2013   At high risk for falls 02/11/2013   Vitamin D deficiency 07/15/2012   Sinoatrial node dysfunction (HCC) 05/08/2011   Pacemaker 08/10/2009   Allergic rhinitis 05/31/2008   Morbid obesity (HCC) 02/12/2007   GASTROESOPHAGEAL REFLUX, NO ESOPHAGITIS 02/12/2007   Female stress incontinence 02/12/2007   Seizure disorder (HCC) 02/12/2007   OSA (obstructive sleep apnea) 02/12/2007    Past Surgical History:  Procedure Laterality Date   APPENDECTOMY     CARDIAC CATHETERIZATION N/A 10/03/2016   Procedure: Right/Left Heart Cath and Coronary Angiography;  Surgeon: Dolores Patty, MD;  Location: [facility] INVASIVE CV LAB;  Service: Cardiovascular;  Laterality: N/A;   HERNIA REPAIR  ? 1988; 04/15/1998   INSERT / REPLACE / REMOVE PACEMAKER  12/16/1990   DDD EF 30%;   INSERT / REPLACE / REMOVE PACEMAKER  07/25/2003   replaced by BB, leads are both IS1 and from 1992   INSERT / REPLACE / REMOVE PACEMAKER  05/21/2011   JA; generator change   LEG SKIN LESION  BIOPSY / EXCISION  05/16/2001   Lichen Planus   VENTRAL HERNIA REPAIR  1989; 04/15/1998     OB History   No obstetric history on file.      Home Medications    Prior to Admission medications   Medication Sig Start Date End Date Taking? Authorizing Provider  amiodarone (PACERONE) 200 MG tablet TAKE 1 TABLET BY MOUTH ONCE DAILY. 06/21/19   [name], [name], MD  cholecalciferol (VITAMIN D) 1000 units tablet Take 1 tablet (1,000 Units total) by mouth daily. 03/20/18   Latrelle Dodrill, MD  fluticasone (FLONASE) 50 MCG/ACT nasal spray Place 2 sprays into both nostrils daily as needed. For congestion. 07/28/17   Latrelle Dodrill, MD  omeprazole (PRILOSEC) 20 MG capsule TAKE (1) CAPSULE BY MOUTH ONCE DAILY. 09/06/19   Latrelle Dodrill, MD  PHENobarbital (LUMINAL) 32.4 MG tablet TAKE (2) TABLETS BY MOUTH TWICE DAILY. (MORNING AND BEDTIME) 08/05/19   Latrelle Dodrill, MD   potassium chloride (K-DUR) 10 MEQ tablet TAKE 4 TABLETS BY MOUTH THREE TIMES DAILY (MORNING, NOON, AND NIGHT) 09/07/19   Laurey Morale, MD  SAVAYSA 60 MG TABS tablet TAKE 1 TABLET BY MOUTH ONCE DAILY. 09/07/19   [name], [name], MD  torsemide (DEMADEX) 20 MG tablet TAKE (2) TABLETS BY MOUTH TWICE DAILY. 12/29/18   [name], [name], MD  triamcinolone ointment (KENALOG) 0.5 % Apply 1 application topically 3 (three) times daily. 09/15/17   Marquette Saa, MD    Family History Family History  Problem Relation Age of Onset   Stroke Father  died from CAD?   Heart disease Father    Cancer Mother    Diabetes Son        Type 1   Sleep apnea Son    Cancer Sister        cervical   Hypertension Sister    Hypertension Brother    Rheum arthritis Daughter        Also PGM, PGGM    Social History Social History   Tobacco Use   Smoking status: Former Smoker    Packs/day: 1.00    Years: 7.00    Pack years: 7.00    Types: Cigarettes    Quit date: 03/16/1969    Years since quitting: 50.5   Smokeless tobacco: Never Used  Substance Use Topics   Alcohol use: No   Drug use: No     Allergies   Aspirin, Penicillins, Tape, Wool alcohol [lanolin], Amoxicillin, and Ampicillin   Review of Systems Review of Systems  Constitutional: Negative for fever.  HENT: Negative for sore throat.   Eyes: Negative for visual disturbance.  Respiratory: Negative for cough and shortness of breath.   Cardiovascular: Negative for chest pain.  Gastrointestinal: Negative for abdominal pain, nausea and vomiting.  Genitourinary: Negative for hematuria.  Musculoskeletal: Positive for back pain.  Skin: Negative for rash.  Neurological:       Denies head trauma  All other systems reviewed and are negative.    Physical Exam Updated Vital Signs BP (!) 115/92    Pulse 77    Temp 98.3 F (36.8 C) (Oral)    Resp 16    SpO2 99%   Physical Exam Vitals signs and nursing note  reviewed.  Constitutional:      General: She is not in acute distress.    Appearance: She is well-developed.  HENT:     Head: Normocephalic and atraumatic.  Eyes:     Conjunctiva/sclera: Conjunctivae normal.  Neck:     Musculoskeletal: Neck supple.  Cardiovascular:     Rate and Rhythm: Normal rate and regular rhythm.     Pulses: Normal pulses.     Heart sounds: Normal heart sounds. No murmur.  Pulmonary:     Effort: Pulmonary effort is normal. No respiratory distress.     Breath sounds: Normal breath sounds. No wheezing, rhonchi or rales.  Abdominal:     General: Bowel sounds are normal.     Palpations: Abdomen is soft.     Tenderness: There is no abdominal tenderness. There is no guarding or rebound.  Musculoskeletal:     Comments: No cervical or thoracic midline TTP. Mild TTP to the lumbar spine. No ecchymosis noted.   Skin:    General: Skin is warm and dry.  Neurological:     Mental Status: She is alert.     Comments: Mental Status:  Alert, thought content appropriate, able to give a coherent history. Speech fluent without evidence of aphasia. Able to follow 2 step commands without difficulty.  Cranial Nerves:  II: pupils equal, round, reactive to light III,IV, VI: ptosis not present, extra-ocular motions intact bilaterally  V,VII: smile symmetric, facial light touch sensation equal VIII: hearing grossly normal to voice  X: uvula elevates symmetrically  XI: bilateral shoulder shrug symmetric and strong XII: midline tongue extension without fassiculations Motor:  Normal tone. 5/5 strength of BUE and BLE major muscle groups including strong and equal grip strength and dorsiflexion/plantar flexion Sensory: light touch normal in all extremities.      ED Treatments /  Results  Labs (all labs ordered are listed, but only abnormal results are displayed) Labs Reviewed - No data to display  EKG None  Radiology Dg Lumbar Spine Complete  Result Date: 09/29/2019 CLINICAL  DATA:  Fall 2 weeks ago EXAM: LUMBAR SPINE - COMPLETE 4+ VIEW COMPARISON:  Lumbar spine radiographs dated 11/26/2016 and CT abdomen pelvis dated 04/15/2018. FINDINGS: There is no evidence of lumbar spine fracture. Alignment is normal. Multilevel degenerative disc and joint disease is seen in the lumbar spine. IMPRESSION: No acute osseous injury. Electronically Signed   By: Romona Curlsyler  Litton M.D.   On: 09/29/2019 15:53    Procedures Procedures (including critical care time)  Medications Ordered in ED Medications - No data to display   Initial Impression / Assessment and Plan / ED Course  I have reviewed the triage vital signs and the nursing notes.  Pertinent labs & imaging results that were available during my care of the patient were reviewed by me and considered in my medical decision making (see chart for details).  Clinical Course as of Sep 28 1618  Wed Sep 29, 2019  1614 Fall 2 weeks ago. Back pain since then. No neuro deficits. Pending head CT, on anticoagulation.   [KM]    Clinical Course User Index [KM] Arlyn DunningMcLean, Kelly A, PA-C    Final Clinical Impressions(s) / ED Diagnoses   Final diagnoses:  Fall, initial encounter  Lumbar back pain   68 year old female with mechanical fall 2 weeks ago where she injured her lower back.  Denies head trauma at that time but she is anticoagulated.  She denies any neurologic deficits in regards to her back pain.  Has been ambulatory and doing her normal ADLs at home since the fall.  She has some mild lumbar midline tenderness on exam.  No cervical or thoracic spine tenderness.  Her neuro exam is normal.  X-ray of the lumbar spine is negative for acute pathology.  At shift change, CT head is pending.  [facility] PA-C with plan to f/u on pending head CT. If negative, pt appropriate for d/c home.   ED Discharge Orders    None       Karrie MeresCouture, Yohann Curl S, [place] JerseyPA-C 09/29/19 1619    Geoffery Lyonselo, [place], MD 09/29/19 1623

## 2019-09-29 NOTE — ED Triage Notes (Signed)
Pt reports a mechanical fall 3 days ago, she tripped over her feet hitting her back. She reports a knot on her left lumbar area. Denies LOC. NAD at triage.

## 2019-09-29 NOTE — ED Notes (Signed)
Patient Alert and oriented to baseline. Stable and ambulatory to baseline. Patient verbalized understanding of the discharge instructions.  Patient belongings were taken by the patient.   

## 2019-09-29 NOTE — Discharge Instructions (Signed)
Please follow up with your primary care provider within 5-7 days for re-evaluation of your symptoms. If you do not have a primary care provider, information for a healthcare clinic has been provided for you to make arrangements for follow up care. Please return to the emergency department for any new or worsening symptoms. ° °

## 2019-09-29 NOTE — ED Provider Notes (Signed)
  Physical Exam  BP (!) 115/92   Pulse 77   Temp 98.3 F (36.8 C) (Oral)   Resp 16   SpO2 99%   Physical Exam  ED Course/Procedures   Clinical Course as of Sep 29 1739  Wed Sep 29, 2019  1614 Fall 2 weeks ago. Back pain since then. No neuro deficits. Pending head CT, on anticoagulation.   [KM]  5170 Patient had CT and lumbar spine imaging are negative for acute findings.  Patient remains at her baseline and pain-free at this time.  Will DC in stable condition.   [KM]    Clinical Course User Index [KM] Alveria Apley, PA-C    Procedures  MDM  Patient with fall 2 weeks ago.  Came to get checked out at the request of her son.  No specific complaints.  Is on anticoagulation and therefore head CT was obtained as well as lumbar spine x-ray.  Patient without any complaints and is at her baseline and is ambulatory and will be sent home back with her family.      Alveria Apley, PA-C 09/29/19 1741    Hayden Rasmussen, MD 10/02/19 [phone]

## 2019-10-04 ENCOUNTER — Other Ambulatory Visit (HOSPITAL_COMMUNITY): Payer: Self-pay | Admitting: Internal Medicine

## 2019-10-17 DIAGNOSIS — R0902 Hypoxemia: Secondary | ICD-10-CM | POA: Diagnosis not present

## 2019-10-17 DIAGNOSIS — G4733 Obstructive sleep apnea (adult) (pediatric): Secondary | ICD-10-CM | POA: Diagnosis not present

## 2019-11-08 ENCOUNTER — Emergency Department (HOSPITAL_COMMUNITY): Payer: Medicare Other

## 2019-11-08 ENCOUNTER — Other Ambulatory Visit: Payer: Self-pay

## 2019-11-08 ENCOUNTER — Inpatient Hospital Stay (HOSPITAL_COMMUNITY)
Admission: EM | Admit: 2019-11-08 | Discharge: 2019-11-12 | DRG: 871 | Disposition: A | Discharge status: Home Health | Payer: Medicare Other | Attending: Family Medicine | Admitting: Family Medicine

## 2019-11-08 ENCOUNTER — Encounter (HOSPITAL_COMMUNITY): Payer: Self-pay | Admitting: Emergency Medicine

## 2019-11-08 DIAGNOSIS — I872 Venous insufficiency (chronic) (peripheral): Secondary | ICD-10-CM | POA: Diagnosis not present

## 2019-11-08 DIAGNOSIS — D696 Thrombocytopenia, unspecified: Secondary | ICD-10-CM | POA: Diagnosis not present

## 2019-11-08 DIAGNOSIS — L03115 Cellulitis of right lower limb: Secondary | ICD-10-CM | POA: Diagnosis not present

## 2019-11-08 DIAGNOSIS — Y92009 Unspecified place in unspecified non-institutional (private) residence as the place of occurrence of the external cause: Secondary | ICD-10-CM | POA: Diagnosis not present

## 2019-11-08 DIAGNOSIS — R2689 Other abnormalities of gait and mobility: Secondary | ICD-10-CM | POA: Diagnosis not present

## 2019-11-08 DIAGNOSIS — Z886 Allergy status to analgesic agent status: Secondary | ICD-10-CM

## 2019-11-08 DIAGNOSIS — R001 Bradycardia, unspecified: Secondary | ICD-10-CM | POA: Diagnosis not present

## 2019-11-08 DIAGNOSIS — Z9181 History of falling: Secondary | ICD-10-CM

## 2019-11-08 DIAGNOSIS — L03116 Cellulitis of left lower limb: Secondary | ICD-10-CM | POA: Diagnosis present

## 2019-11-08 DIAGNOSIS — I130 Hypertensive heart and chronic kidney disease with heart failure and stage 1 through stage 4 chronic kidney disease, or unspecified chronic kidney disease: Secondary | ICD-10-CM | POA: Diagnosis not present

## 2019-11-08 DIAGNOSIS — Z87891 Personal history of nicotine dependence: Secondary | ICD-10-CM

## 2019-11-08 DIAGNOSIS — Z833 Family history of diabetes mellitus: Secondary | ICD-10-CM

## 2019-11-08 DIAGNOSIS — I495 Sick sinus syndrome: Secondary | ICD-10-CM | POA: Diagnosis present

## 2019-11-08 DIAGNOSIS — W1830XA Fall on same level, unspecified, initial encounter: Secondary | ICD-10-CM | POA: Diagnosis present

## 2019-11-08 DIAGNOSIS — G4733 Obstructive sleep apnea (adult) (pediatric): Secondary | ICD-10-CM | POA: Diagnosis present

## 2019-11-08 DIAGNOSIS — Z881 Allergy status to other antibiotic agents status: Secondary | ICD-10-CM

## 2019-11-08 DIAGNOSIS — Z9981 Dependence on supplemental oxygen: Secondary | ICD-10-CM

## 2019-11-08 DIAGNOSIS — Z79899 Other long term (current) drug therapy: Secondary | ICD-10-CM

## 2019-11-08 DIAGNOSIS — A419 Sepsis, unspecified organism: Secondary | ICD-10-CM | POA: Diagnosis not present

## 2019-11-08 DIAGNOSIS — I4821 Permanent atrial fibrillation: Secondary | ICD-10-CM | POA: Diagnosis present

## 2019-11-08 DIAGNOSIS — Z743 Need for continuous supervision: Secondary | ICD-10-CM | POA: Diagnosis not present

## 2019-11-08 DIAGNOSIS — Z20828 Contact with and (suspected) exposure to other viral communicable diseases: Secondary | ICD-10-CM | POA: Diagnosis present

## 2019-11-08 DIAGNOSIS — I5022 Chronic systolic (congestive) heart failure: Secondary | ICD-10-CM | POA: Diagnosis present

## 2019-11-08 DIAGNOSIS — R531 Weakness: Secondary | ICD-10-CM | POA: Diagnosis not present

## 2019-11-08 DIAGNOSIS — Z7901 Long term (current) use of anticoagulants: Secondary | ICD-10-CM

## 2019-11-08 DIAGNOSIS — Z823 Family history of stroke: Secondary | ICD-10-CM

## 2019-11-08 DIAGNOSIS — K219 Gastro-esophageal reflux disease without esophagitis: Secondary | ICD-10-CM | POA: Diagnosis present

## 2019-11-08 DIAGNOSIS — G40409 Other generalized epilepsy and epileptic syndromes, not intractable, without status epilepticus: Secondary | ICD-10-CM | POA: Diagnosis present

## 2019-11-08 DIAGNOSIS — L259 Unspecified contact dermatitis, unspecified cause: Secondary | ICD-10-CM | POA: Diagnosis not present

## 2019-11-08 DIAGNOSIS — Z950 Presence of cardiac pacemaker: Secondary | ICD-10-CM | POA: Diagnosis not present

## 2019-11-08 DIAGNOSIS — Z91048 Other nonmedicinal substance allergy status: Secondary | ICD-10-CM

## 2019-11-08 DIAGNOSIS — N1832 Chronic kidney disease, stage 3b: Secondary | ICD-10-CM | POA: Diagnosis not present

## 2019-11-08 DIAGNOSIS — Z6841 Body Mass Index (BMI) 40.0 and over, adult: Secondary | ICD-10-CM

## 2019-11-08 DIAGNOSIS — J440 Chronic obstructive pulmonary disease with acute lower respiratory infection: Secondary | ICD-10-CM | POA: Diagnosis not present

## 2019-11-08 DIAGNOSIS — M8580 Other specified disorders of bone density and structure, unspecified site: Secondary | ICD-10-CM | POA: Diagnosis present

## 2019-11-08 DIAGNOSIS — L538 Other specified erythematous conditions: Secondary | ICD-10-CM | POA: Diagnosis not present

## 2019-11-08 DIAGNOSIS — Z8249 Family history of ischemic heart disease and other diseases of the circulatory system: Secondary | ICD-10-CM

## 2019-11-08 DIAGNOSIS — E876 Hypokalemia: Secondary | ICD-10-CM | POA: Diagnosis not present

## 2019-11-08 DIAGNOSIS — J189 Pneumonia, unspecified organism: Secondary | ICD-10-CM | POA: Diagnosis not present

## 2019-11-08 DIAGNOSIS — Z880 Allergy status to penicillin: Secondary | ICD-10-CM

## 2019-11-08 DIAGNOSIS — L089 Local infection of the skin and subcutaneous tissue, unspecified: Secondary | ICD-10-CM | POA: Diagnosis not present

## 2019-11-08 DIAGNOSIS — R0689 Other abnormalities of breathing: Secondary | ICD-10-CM | POA: Diagnosis not present

## 2019-11-08 LAB — CBC WITH DIFFERENTIAL/PLATELET
Abs Immature Granulocytes: 0.21 10*3/uL — ABNORMAL HIGH (ref 0.00–0.07)
Basophils Absolute: 0 10*3/uL (ref 0.0–0.1)
Basophils Relative: 0 %
Eosinophils Absolute: 0 10*3/uL (ref 0.0–0.5)
Eosinophils Relative: 0 %
HCT: 42.1 % (ref 36.0–46.0)
Hemoglobin: 13.8 g/dL (ref 12.0–15.0)
Immature Granulocytes: 1 %
Lymphocytes Relative: 2 %
Lymphs Abs: 0.3 10*3/uL — ABNORMAL LOW (ref 0.7–4.0)
MCH: 30.4 pg (ref 26.0–34.0)
MCHC: 32.8 g/dL (ref 30.0–36.0)
MCV: 92.7 fL (ref 80.0–100.0)
Monocytes Absolute: 0.5 10*3/uL (ref 0.1–1.0)
Monocytes Relative: 3 %
Neutro Abs: 14.3 10*3/uL — ABNORMAL HIGH (ref 1.7–7.7)
Neutrophils Relative %: 94 %
Platelets: 97 10*3/uL — ABNORMAL LOW (ref 150–400)
RBC: 4.54 MIL/uL (ref 3.87–5.11)
RDW: 16.3 % — ABNORMAL HIGH (ref 11.5–15.5)
WBC: 15.2 10*3/uL — ABNORMAL HIGH (ref 4.0–10.5)
nRBC: 0 % (ref 0.0–0.2)

## 2019-11-08 LAB — POCT I-STAT EG7
Acid-base deficit: 2 mmol/L (ref 0.0–2.0)
Bicarbonate: 21 mmol/L (ref 20.0–28.0)
Calcium, Ion: 1.15 mmol/L (ref 1.15–1.40)
HCT: 42 % (ref 36.0–46.0)
Hemoglobin: 14.3 g/dL (ref 12.0–15.0)
O2 Saturation: 77 %
Potassium: 3.1 mmol/L — ABNORMAL LOW (ref 3.5–5.1)
Sodium: 140 mmol/L (ref 135–145)
TCO2: 22 mmol/L (ref 22–32)
pCO2, Ven: 31.5 mmHg — ABNORMAL LOW (ref 44.0–60.0)
pH, Ven: 7.431 — ABNORMAL HIGH (ref 7.250–7.430)
pO2, Ven: 40 mmHg (ref 32.0–45.0)

## 2019-11-08 LAB — COMPREHENSIVE METABOLIC PANEL
ALT: 12 U/L (ref 0–44)
AST: 26 U/L (ref 15–41)
Albumin: 3 g/dL — ABNORMAL LOW (ref 3.5–5.0)
Alkaline Phosphatase: 106 U/L (ref 38–126)
Anion gap: 11 (ref 5–15)
BUN: 13 mg/dL (ref 8–23)
CO2: 21 mmol/L — ABNORMAL LOW (ref 22–32)
Calcium: 9 mg/dL (ref 8.9–10.3)
Chloride: 107 mmol/L (ref 98–111)
Creatinine, Ser: 1.27 mg/dL — ABNORMAL HIGH (ref 0.44–1.00)
GFR calc Af Amer: 50 mL/min — ABNORMAL LOW (ref >60)
GFR calc non Af Amer: 43 mL/min — ABNORMAL LOW (ref >60)
Glucose, Bld: 144 mg/dL — ABNORMAL HIGH (ref 70–99)
Potassium: 3.1 mmol/L — ABNORMAL LOW (ref 3.5–5.1)
Sodium: 139 mmol/L (ref 135–145)
Total Bilirubin: 2 mg/dL — ABNORMAL HIGH (ref 0.3–1.2)
Total Protein: 6.6 g/dL (ref 6.5–8.1)

## 2019-11-08 LAB — LACTIC ACID, PLASMA
Lactic Acid, Venous: 4 mmol/L (ref 0.5–1.9)
Lactic Acid, Venous: 2.7 mmol/L (ref 0.5–1.9)

## 2019-11-08 LAB — PROCALCITONIN: Procalcitonin: 0.97 ng/mL

## 2019-11-08 LAB — BRAIN NATRIURETIC PEPTIDE: B Natriuretic Peptide: 4306.1 pg/mL — ABNORMAL HIGH (ref 0.0–100.0)

## 2019-11-08 LAB — POC SARS CORONAVIRUS 2 AG -  ED: SARS Coronavirus 2 Ag: NEGATIVE

## 2019-11-08 LAB — APTT: aPTT: 34 seconds (ref 24–36)

## 2019-11-08 LAB — SARS CORONAVIRUS 2 (TAT 6-24 HRS): SARS Coronavirus 2: NEGATIVE

## 2019-11-08 LAB — TROPONIN I (HIGH SENSITIVITY)
Troponin I (High Sensitivity): 53 ng/L — ABNORMAL HIGH (ref <18)
Troponin I (High Sensitivity): 45 ng/L — ABNORMAL HIGH (ref <18)

## 2019-11-08 LAB — PROTIME-INR
INR: 1.7 — ABNORMAL HIGH (ref 0.8–1.2)
Prothrombin Time: 19.6 seconds — ABNORMAL HIGH (ref 11.4–15.2)

## 2019-11-08 MED ORDER — SODIUM CHLORIDE 0.9 % IV BOLUS
1000.0000 mL | Freq: Once | INTRAVENOUS | Status: AC
  Administered 2019-11-08: 1000 mL via INTRAVENOUS

## 2019-11-08 MED ORDER — SODIUM CHLORIDE 0.9 % IV SOLN: 250.0000 mL | INTRAVENOUS | Status: DC | PRN

## 2019-11-08 MED ORDER — TORSEMIDE 20 MG PO TABS
40.0000 mg | ORAL_TABLET | Freq: Two times a day (BID) | ORAL | Status: DC
  Administered 2019-11-08 – 2019-11-10 (×5): 40 mg via ORAL
  Filled 2019-11-08 (×5): qty 2

## 2019-11-08 MED ORDER — POTASSIUM CHLORIDE CRYS ER 20 MEQ PO TBCR
40.0000 meq | EXTENDED_RELEASE_TABLET | Freq: Two times a day (BID) | ORAL | Status: DC
  Administered 2019-11-08 – 2019-11-09 (×2): 40 meq via ORAL
  Filled 2019-11-08 (×2): qty 2

## 2019-11-08 MED ORDER — SODIUM CHLORIDE 0.9% FLUSH: 3.0000 mL | INTRAVENOUS | Status: DC | PRN

## 2019-11-08 MED ORDER — ACETAMINOPHEN 500 MG PO TABS: 1000.0000 mg | ORAL_TABLET | Freq: Once | ORAL | Status: DC

## 2019-11-08 MED ORDER — VANCOMYCIN HCL 10 G IV SOLR: 2000.0000 mg | INTRAVENOUS | Status: DC

## 2019-11-08 MED ORDER — PANTOPRAZOLE SODIUM 40 MG IV SOLR
40.0000 mg | Freq: Every day | INTRAVENOUS | Status: DC
  Administered 2019-11-09: 40 mg via INTRAVENOUS
  Filled 2019-11-08: qty 40

## 2019-11-08 MED ORDER — PHENOBARBITAL 32.4 MG PO TABS
64.8000 mg | ORAL_TABLET | Freq: Two times a day (BID) | ORAL | Status: DC
  Administered 2019-11-08 – 2019-11-12 (×8): 64.8 mg via ORAL
  Filled 2019-11-08 (×2): qty 2
  Filled 2019-11-08: qty 1
  Filled 2019-11-08 (×6): qty 2
  Filled 2019-11-08: qty 1

## 2019-11-08 MED ORDER — AMIODARONE HCL 200 MG PO TABS
200.0000 mg | ORAL_TABLET | Freq: Every day | ORAL | Status: DC
  Administered 2019-11-09 – 2019-11-12 (×4): 200 mg via ORAL
  Filled 2019-11-08 (×4): qty 1

## 2019-11-08 MED ORDER — EDOXABAN TOSYLATE 60 MG PO TABS
60.0000 mg | ORAL_TABLET | Freq: Every day | ORAL | Status: DC
  Filled 2019-11-08 (×2): qty 60

## 2019-11-08 MED ORDER — SODIUM CHLORIDE 0.9% FLUSH
3.0000 mL | Freq: Two times a day (BID) | INTRAVENOUS | Status: DC
  Administered 2019-11-08 – 2019-11-12 (×8): 3 mL via INTRAVENOUS

## 2019-11-08 MED ORDER — ACETAMINOPHEN 650 MG RE SUPP: 650.0000 mg | Freq: Four times a day (QID) | RECTAL | Status: DC | PRN

## 2019-11-08 MED ORDER — VANCOMYCIN HCL IN DEXTROSE 1-5 GM/200ML-% IV SOLN: 1000.0000 mg | Freq: Once | INTRAVENOUS | Status: DC

## 2019-11-08 MED ORDER — METRONIDAZOLE IN NACL 5-0.79 MG/ML-% IV SOLN
500.0000 mg | Freq: Once | INTRAVENOUS | Status: AC
  Administered 2019-11-08: 500 mg via INTRAVENOUS
  Filled 2019-11-08: qty 100

## 2019-11-08 MED ORDER — VANCOMYCIN HCL 10 G IV SOLR
2500.0000 mg | Freq: Once | INTRAVENOUS | Status: AC
  Administered 2019-11-08: 2500 mg via INTRAVENOUS
  Filled 2019-11-08: qty 2500

## 2019-11-08 MED ORDER — SODIUM CHLORIDE 0.9 % IV SOLN
2.0000 g | INTRAVENOUS | Status: DC
  Administered 2019-11-08 – 2019-11-09 (×2): 2 g via INTRAVENOUS
  Filled 2019-11-08: qty 2
  Filled 2019-11-08 (×2): qty 20

## 2019-11-08 MED ORDER — ACETAMINOPHEN 325 MG PO TABS
650.0000 mg | ORAL_TABLET | Freq: Four times a day (QID) | ORAL | Status: DC | PRN
  Administered 2019-11-09 – 2019-11-12 (×3): 650 mg via ORAL
  Filled 2019-11-08 (×3): qty 2

## 2019-11-08 MED ORDER — VITAMIN D 25 MCG (1000 UNIT) PO TABS
1000.0000 [IU] | ORAL_TABLET | Freq: Every day | ORAL | Status: DC
  Administered 2019-11-09 – 2019-11-12 (×4): 1000 [IU] via ORAL
  Filled 2019-11-08 (×5): qty 1

## 2019-11-08 MED ORDER — SODIUM CHLORIDE 0.9 % IV SOLN: 2.0000 g | Freq: Once | INTRAVENOUS | Status: DC

## 2019-11-08 NOTE — ED Notes (Signed)
Son of the patient called for an update.

## 2019-11-08 NOTE — H&P (Addendum)
Family Medicine Teaching [facility] Admission History and Physical Service Pager: [phone]  [name] Medical record number: [id number] Date of birth: February 02, 1951 Age: 68 y.o. Gender: female  Primary Care Provider: Latrelle Dodrill, MD Consultants: None Code Status: DNI (does not want intubation) Preferred Emergency Contact: Son Caitlynn Ju [phone]  Chief Complaint: weakness  Assessment and Plan: KANDICE SCHMELTER is a 68 y.o. female presenting with fever and fatigue with a mechanical fall. PMH is significant for A. Fib,  pacemaker in place, Sleep apnea on 2L baseline [place], HFrEF, cellulitis of lower extremities, C. Diff infection (2016), and Sick sinus syndrome with pacemaker.  Sepsis 2/2 Community Acquired Pneumonia: patient brought in by EMS after falling at home. In the ED she was satting 93% on RA (patient with COPD is on 2L [place] at baseline), tachypneic, tachycardic and febrile to 101.5*F. CODE SEPSIS was initiated in the ED. Patient given 2L NS, started on CTX, Vanc and Flagyl in the ED, tylenol for her fever. Chest X-ray on admission showing LLL consolidation consistent with pneumonia.  Leukocytosis at 15.2, platelets low at 97, LA > 4.0>2.7, Troponins 53>45. PH slightly elevated at 7.43, CO2 low at 31.5, BNP very elevated at 4,306. INR 1.7. Blood pressures at 80/53 on arrival to the ED, have improved to 108/67 after fluids. Temperature 101.5*F. Sepsis is most likely due to patient's CAP. Could also consider worsening lower extremity cellulitis as possible contributing factor. No symptoms concerning for UTI.  Blood cultures drawn and are pending.  She denies any sick contacts, any known exposure to Covid. -Admit to FPTS, attending Dr. Deirdre Priest -Continue ceftriaxone, vancomycin, and Flagyl - will modify and cut back on antibiotics to prevent nephrotoxicity -Follow up Blood Cultures -Continuous cardiac and pulse ox monitoring -COVID testing pending -Ambulate  with Assistance -Vitals per routine -PT/OT once patient is more stable -Oxygen supplementation -goal is 2 L nasal cannula -Trend LA   Cellulitis: Patient has a history of bilateral lower extremity cellulitis.  Patient states that it has recently gotten worse.  On inspection there is no purulent drainage.  Cellulitis of right lower extremity is worse than left (see photo).  No particular concern at this time for abscess or osteomyelitis.  Patient is on anticoagulation with edoxaban, calf sizes are the same, low suspicion for DVT. -We will monitor changes while on antibiotics  HFrEF: Last Echo January 2020 showing EF ~25%.  Patient taking torsemide 40mg  PO BID at home, however she missed her dosing yesterday 11/22 and this morning 11/23.. -Continue torsemide 40mg  PO BID -Hold IV fluids  HTN: patient with a history of hypertension. Bps in the ED are 80/53, slightly improved to 108/67 after receiving IV fluids. Takes Amiodarone 200mg  and Torsemide 40mg  BID. -Frequent BP checks -Continue home meds amiodarone 200 mg daily and torsemide 40 mg PO twice daily  Hypokalemia: K 3.1 on admission. Prescribed 12/23 TID PO at home. -A.m. CMP -Kdur bid   CKD 3b: Was CKD3a with GFR 55 3 months ago. On admission GFR 43, Cr 1.27 on admission (BL ~1.0).  Labs were drawn prior to administration of fluids. -Avoid nephrotoxic meds -AM BMP  A. Fib on Anticoagulation: rate is 70 in the ED. Amiodarone 200 daily. On Edoxaban for anticoagulation. -Continue home dose of amiodarone -Continue edoxaban  Seizure Disorder: takes Phenobarbital 64.8 mg twice daily.  Has not had a medication in the last day and a half due to feeling ill.  Patient denies any loss of consciousness  with her mechanical fall today, denies any loss of bladder or bowel function, biting her tongue. -Check phenobarbital level -Continue phenobarb 64.8 bid   GERD: takes Ompeprazole 20mg  daily. - Hold omeprazole - Protonix while in  hospital  Sick Sinus syndrome with Pacemaker since 1992: [place], placed in 2012. -Continuous cardiac monitoring  FEN/GI: Saline lock, renal/carb modified diet Prophylaxis: Edoxaban  Disposition: Admit to medical telemetry  History of Present Illness:  Misty Gray is a 68 y.o. female presenting with weakness and fatigue with mechanical fall at home onto her bed. She ate some cereal yesterday with some milk that she thinks was bad. It made her feel sick but didn't throw up.  Because she felt sick she did not take any of her medications.  This morning, she woke up feeling sick and weak.  She did not take anything to make herself feel better.  Nothing seemed to make her symptoms worse.  She states she has had a cough for about the past 3 weeks, it has been nonproductive and she attributes it to her allergies.  She does report that her lower extremities do feel worse off than they usually are, with the right being worse than the left.  She denies fevers at home, congestion, sore throat, chest pain, palpitation, shortness of breath while on her oxygen, abdominal pain, constipation, diarrhea, and urinary symptoms.  Review Of Systems: Per HPI with the following additions: See HPI  Review of Systems  Constitutional: Positive for fever and malaise/fatigue.  HENT: Negative for congestion and sore throat.   Cardiovascular: Negative for chest pain and palpitations.  Gastrointestinal: Positive for nausea. Negative for abdominal pain, constipation, diarrhea and vomiting.  Genitourinary: Negative for dysuria, frequency and urgency.    Patient Active Problem List   Diagnosis Date Noted  . Abdominal wall hernia 11/05/2017  . Anticoagulated 11/05/2017  . Rash and nonspecific skin eruption 09/15/2017  . Ventral hernia without obstruction or gangrene 08/15/2017  . Skin irritation 07/23/2017  . Chronic venous stasis dermatitis 12/26/2016  . Urinary tract infection without hematuria   . Frequent PVCs    . Hypokalemia   . Housing problems 11/23/2016  . Chronic atrial fibrillation ([place]) 11/01/2016  . Hypotension 10/31/2016  . Special screening for malignant neoplasms, colon 08/12/2016  . Heme positive stool 08/09/2016  . Intertrigo 08/09/2016  . Chronic anticoagulation 04/26/2016  . Chronic venous insufficiency   . Preventative health care 10/05/2014  . Breast mass, left 10/05/2014  . Osteopenia 09/15/2014  . Hypertension 01/04/2014  . Chronic systolic dysfunction of left ventricle 05/05/2013  . At high risk for falls 02/11/2013  . Vitamin D deficiency 07/15/2012  . Sinoatrial node dysfunction ([place]) 05/08/2011  . Pacemaker 08/10/2009  . Allergic rhinitis 05/31/2008  . Morbid obesity ([place]) 02/12/2007  . GASTROESOPHAGEAL REFLUX, NO ESOPHAGITIS 02/12/2007  . Female stress incontinence 02/12/2007  . Seizure disorder ([place]) 02/12/2007  . OSA (obstructive sleep apnea) 02/12/2007    Past Medical History: Past Medical History:  Diagnosis Date  . Abnormal CT of the head 12/16/1984   R parietal atrophy  . Allergic rhinitis, cause unspecified   . Altered mental status 11/28/2016  . Anemia   . Arthritis    "hands and knees" (09/06/2015)  . Atrial fibrillation ([place])   . Cellulitis 09/07/2015  . Cellulitis and abscess of leg 09/2016   bilateral  . CHF (congestive heart failure) ([place])   . Diaphragmatic hernia without mention of obstruction or gangrene   . Dyspnea   .  Exertional dyspnea 06/22/12  . Family history of adverse reaction to anesthesia    "daughter fights w/them; just can't relax during OR; stay awake during the OR"  . Fatigue 06/22/2012  . Female stress incontinence   . GERD (gastroesophageal reflux disease)   . Grand mal seizure (HCC)   . H/O hiatal hernia    two removed  . Heart murmur   . High cholesterol   . History of blood transfusion 1956   "related to nose bleed"  . Hypertension   . Influenza A 01/11/2014  . Lichenification and lichen simplex chronicus   .  Migraine    "when I was a teenager"  . Morbid obesity (HCC)   . Nonischemic cardiomyopathy (HCC)    EF has normalized-repeat Pending   . On home oxygen therapy    "2L when I'm asleep in bed" (09/06/2015)  . OSA on CPAP   . Pacemaker  st judes    Patient states "this is my third pacemaker"  . Pneumonia 2004; 2011; 11/2011  . Seizures (HCC) since 1981   "can hear you talking but sounds like you are in big tunnel; have them often if not taking RX; last one was 06/17/12" (09/06/2015)  . Shortness of breath 10/24/2010   Qualifier: Diagnosis of  By: [place], Bonnie    . Sick sinus syndrome (HCC)    WITH PRIOR DDD PACEMAKER IMPLANTATION  . Somnolence   . Stroke [facility](HCC) 1988   "mouth drawed real bad on my left side; found out it was a seizure"  . Syncope and collapse 06/22/12   "hit forehead and left knee"; denies loss of consciousness  . Unspecified venous (peripheral) insufficiency     Past Surgical History: Past Surgical History:  Procedure Laterality Date  . APPENDECTOMY    . CARDIAC CATHETERIZATION N/A 10/03/2016   Procedure: Right/Left Heart Cath and Coronary Angiography;  Surgeon: Dolores Pattyaniel R [name], MD;  [facility];  Service: Cardiovascular;  Laterality: N/A;  . HERNIA REPAIR  ? 1988; 04/15/1998  . INSERT / REPLACE / REMOVE PACEMAKER  12/16/1990   DDD EF 30%;  Marland Kitchen. INSERT / REPLACE / REMOVE PACEMAKER  07/25/2003   replaced by BB, leads are both IS1 and from 1992  . INSERT / REPLACE / REMOVE PACEMAKER  05/21/2011   JA; generator change  . LEG SKIN LESION  BIOPSY / EXCISION  05/16/2001   Lichen Planus  . VENTRAL HERNIA REPAIR  1989; 04/15/1998    Social History: Social History   Tobacco Use  . Smoking status: Former Smoker    Packs/day: 1.00    Years: 7.00    Pack years: 7.00    Types: Cigarettes    Quit date: 03/16/1969    Years since quitting: 50.6  . Smokeless tobacco: Never Used  Substance Use Topics  . Alcohol use: No  . Drug use: No   Additional social history:  none Please also refer to relevant sections of EMR.  Family History: Family History  Problem Relation Age of Onset  . Stroke Father        died from CAD?  Marland Kitchen. Heart disease Father   . Cancer Mother   . Diabetes Son        Type 1  . Sleep apnea Son   . Cancer Sister        cervical  . Hypertension Sister   . Hypertension Brother   . Rheum arthritis Daughter        Also PGM,  PGGM   (If not completed, MUST add something in)  Allergies and Medications: Allergies  Allergen Reactions  . Aspirin Hives and Rash  . Penicillins Rash and Other (See Comments)    Has patient had a PCN reaction causing immediate rash, facial/tongue/throat swelling, SOB or lightheadedness with hypotension: Yes Has patient had a PCN reaction causing severe rash involving mucus membranes or skin necrosis: No Has patient had a PCN reaction that required hospitalization No Has patient had a PCN reaction occurring within the last 10 years: Yes If all of the above answers are "NO", then may proceed with Cephalosporin use.  Patient has tolerated cephalosporins in 2017.  . Tape Other (See Comments)    NO PAPER TAPE-MUST BE MEDICAL TAPE - unknown reaction NO PAPER TAPE-MUST BE MEDICAL TAPE - unknown reaction  . Wool Alcohol [Lanolin] Hives  . Amoxicillin Rash    Patient has tolerated cephalosporins  . Ampicillin Itching and Rash    Patient has tolerated cephalosporins   No current facility-administered medications on file prior to encounter.    Current Outpatient Medications on File Prior to Encounter  Medication Sig Dispense Refill  . amiodarone (PACERONE) 200 MG tablet TAKE 1 TABLET BY MOUTH ONCE DAILY. 28 tablet 10  . cholecalciferol (VITAMIN D) 1000 units tablet Take 1 tablet (1,000 Units total) by mouth daily. 90 tablet 3  . fluticasone (FLONASE) 50 MCG/ACT nasal spray Place 2 sprays into both nostrils daily as needed. For congestion. 16 g 1  . omeprazole (PRILOSEC) 20 MG capsule TAKE (1) CAPSULE BY  MOUTH ONCE DAILY. 28 capsule 11  . PHENobarbital (LUMINAL) 32.4 MG tablet TAKE (2) TABLETS BY MOUTH TWICE DAILY. (MORNING AND BEDTIME) 112 tablet 4  . potassium chloride (K-DUR) 10 MEQ tablet TAKE 4 TABLETS BY MOUTH THREE TIMES DAILY (MORNING, NOON, AND NIGHT) 336 tablet 11  . SAVAYSA 60 MG TABS tablet TAKE 1 TABLET BY MOUTH ONCE DAILY. 28 tablet 11  . torsemide (DEMADEX) 20 MG tablet TAKE (2) TABLETS BY MOUTH TWICE DAILY. 112 tablet 11  . triamcinolone ointment (KENALOG) 0.5 % Apply 1 application topically 3 (three) times daily. 60 g 0    Objective: BP (!) 80/53   Pulse 67   Temp 98.5 F (36.9 C) (Oral)   Resp (!) 21   Ht 5\' 4"  (1.626 m)   Wt 128.4 kg   SpO2 100%   BMI 48.58 kg/m  Exam: General: No apparent distress, pleasant patient, nontoxic appearing Eyes: EOMI ENTM: Moist mucous membranes, no pharyngeal erythema Neck: Supple, no thyromegaly, no lymphadenopathy Cardiovascular: Irregularly irregular rhythm, rate in the 70s with frequent PVCs, 2+ pedal pulses appreciated bilaterally Respiratory: Crackles appreciated to left lower lung field, moving air well, minimal crackles appreciated to right lower lung field. Gastrointestinal: Obese abdomen, normal bowel sounds appreciated MSK: No gross deformity appreciated Derm: Cellulitic lesion appreciated to bilateral lower extremities Neuro: A&O x3, no focal neurological deficits      Labs and Imaging: CBC BMET  Recent Labs  Lab 11/08/19 1349 11/08/19 1410  WBC 15.2*  --   HGB 13.8 14.3  HCT 42.1 42.0  PLT 97*  --    Recent Labs  Lab 11/08/19 1349 11/08/19 1410  NA 139 140  K 3.1* 3.1*  CL 107  --   CO2 21*  --   BUN 13  --   CREATININE 1.27*  --   GLUCOSE 144*  --   CALCIUM 9.0  --      Lactic Acid: 4.0 >  2.7 Troponins: 53 > 45 BNP: 4,300+ Procalcitonin: 0.97   EKG: No EKG   Dg Chest Port 1 View  Result Date: 11/08/2019 CLINICAL DATA:  Sepsis. EXAM: PORTABLE CHEST 1 VIEW COMPARISON:  November 28, 2016 FINDINGS: There is consolidation in the left lower lobe with small left pleural effusion. Lungs elsewhere are clear. There is cardiomegaly with pulmonary vascularity normal. Pacemaker leads attached to right atrium and right ventricle. No adenopathy. No bone lesions. IMPRESSION: Left lower lobe consolidation consistent with pneumonia with small left pleural effusion. Lungs elsewhere clear. There is cardiomegaly. Pacemaker leads attached right atrium and right ventricle. No adenopathy. Electronically Signed   By: Bretta BangWilliam  Woodruff III M.D.   On: 11/08/2019 14:02    Dollene ClevelandAnderson, Elinora Weigand C, DO 11/08/2019, 5:43 PM PGY-2, [place] Family Medicine FPTS Intern pager: [phone], text pages welcome

## 2019-11-08 NOTE — Progress Notes (Signed)
Notified bedside nurse of need to draw repeat lactic acid. 

## 2019-11-08 NOTE — Progress Notes (Signed)
Notified bedside nurse of need to administer fluid bolus 1457 cc needed.

## 2019-11-08 NOTE — ED Notes (Signed)
ED Provider at bedside. 

## 2019-11-08 NOTE — Progress Notes (Signed)
Pharmacy Antibiotic Note  Misty Gray is a 68 y.o. female admitted on 11/08/2019 with fever, fatigue, and mechanical fall.  Pharmacy has been consulted for vancomcyin dosing for sepsis of unknown source. Patient reports chronic cellulitis but increase in warmth lately. WBC 15.2. Lactic acid 4.0. Scr 1.27 with baseline ~0.8-0.9.   Vancomycin 2000 mg IV Q 48 hrs. Goal AUC 400-550. Expected AUC: 459 SCr used: 1.27 Vd used: 0.5  Plan: Vancomycin 2500mg  x1 loading dose  Vancomycin 2000mg  IV q48h Follow Scr trend closely and change to random level dosing if indicated  Monitor renal function, cultures/sensitivities, and clinical progression   Height: 5\' 4"  (162.6 cm) Weight: 283 lb (128.4 kg) IBW/kg (Calculated) : 54.7  No data recorded.  No results for input(s): WBC, CREATININE, LATICACIDVEN, VANCOTROUGH, VANCOPEAK, VANCORANDOM, GENTTROUGH, GENTPEAK, GENTRANDOM, TOBRATROUGH, TOBRAPEAK, TOBRARND, AMIKACINPEAK, AMIKACINTROU, AMIKACIN in the last 168 hours.  CrCl cannot be calculated (Patient's most recent lab result is older than the maximum 21 days allowed.).    Allergies  Allergen Reactions  . Aspirin Hives and Rash  . Penicillins Rash and Other (See Comments)    Has patient had a PCN reaction causing immediate rash, facial/tongue/throat swelling, SOB or lightheadedness with hypotension: Yes Has patient had a PCN reaction causing severe rash involving mucus membranes or skin necrosis: No Has patient had a PCN reaction that required hospitalization No Has patient had a PCN reaction occurring within the last 10 years: Yes If all of the above answers are "NO", then may proceed with Cephalosporin use.   . Tape Other (See Comments)    NO PAPER TAPE-MUST BE MEDICAL TAPE - unknown reaction NO PAPER TAPE-MUST BE MEDICAL TAPE - unknown reaction  . Wool Alcohol [Lanolin] Hives  . Amoxicillin Rash  . Ampicillin Itching and Rash    Antimicrobials this admission: Vancomycin 11/23  >> Ceftriaxone 11/23 >> Metronidazole x1 on 11/23   Dose adjustments this admission: N/A  Microbiology results: 11/23 BCx: collected  11/23 UCx: collected  Thank you for allowing pharmacy to be a part of this patient's care.  Cristela Felt, PharmD PGY1 Pharmacy Resident [employer]: [phone]  11/08/2019 2:05 PM

## 2019-11-08 NOTE — ED Triage Notes (Signed)
Patient presents to the ED by EMS with c/o sepsis and fall. Fever 101.5, afib rate of 100, tachypnea resp 40 before oxygen. Right leg with weeping, redness and is fluctuant. Initial call was fall from bed and possible seizure. Pt denies hx or injury during the fall. She is supposed to be on 2L oxygen but has not be able. Pt is alert and oriented.

## 2019-11-08 NOTE — Progress Notes (Deleted)
Notified bedside nurse of need to administer blood cultures and fluid bolus 1457 cc .

## 2019-11-08 NOTE — ED Notes (Signed)
Phlebotomy notified of patein ts need for repeat Lactic and Troponin.

## 2019-11-08 NOTE — ED Notes (Signed)
ED TO INPATIENT HANDOFF REPORT  ED Nurse Name and Phone #: Shmuel Girgis, [phone]  S Name/Age/Gender Misty Gray 68 y.o. female Room/Bed: RESUSC/RESUSC  Code Status   Code Status: Prior  Home/SNF/Other Skilled nursing facility Patient oriented to: self, place, time and situation Is this baseline? Yes   Triage Complete: Triage complete  Chief Complaint code sepsis  Triage Note Patient presents to the ED by EMS with c/o sepsis and fall. Fever 101.5, afib rate of 100, tachypnea resp 40 before oxygen. Right leg with weeping, redness and is fluctuant. Initial call was fall from bed and possible seizure. Pt denies hx or injury during the fall. She is supposed to be on 2L oxygen but has not be able. Pt is alert and oriented.     Allergies Allergies  Allergen Reactions  . Aspirin Hives and Rash  . Penicillins Rash and Other (See Comments)    Has patient had a PCN reaction causing immediate rash, facial/tongue/throat swelling, SOB or lightheadedness with hypotension: Yes Has patient had a PCN reaction causing severe rash involving mucus membranes or skin necrosis: No Has patient had a PCN reaction that required hospitalization No Has patient had a PCN reaction occurring within the last 10 years: Yes If all of the above answers are "NO", then may proceed with Cephalosporin use.  Patient has tolerated cephalosporins in 2017.  . Tape Other (See Comments)    NO PAPER TAPE-MUST BE MEDICAL TAPE - unknown reaction NO PAPER TAPE-MUST BE MEDICAL TAPE - unknown reaction  . Wool Alcohol [Lanolin] Hives  . Amoxicillin Rash    Patient has tolerated cephalosporins  . Ampicillin Itching and Rash    Patient has tolerated cephalosporins    Level of Care/Admitting Diagnosis ED Disposition    ED Disposition Condition Comment   Admit  [facility]ospital [facility] [[id number]]  Level of Care: Telemetry Medical [104]  Covid Evaluation: Asymptomatic Screening Protocol (No Symptoms)   Diagnosis: Sepsis due to pneumonia [facility](HCC) [[id number]]) [[id number]]  Admitting Physician: Ivery QualeHAMBLISS, MARSHALL L [[id number]]  Attending Physician: Pearlean BrownieHAMBLISS, MARSHALL L [[id number]]  Estimated length of stay: past midnight tomorrow  Certification:: I certify this patient will need inpatient services for at least 2 midnights  PT Class (Do Not Modify): Inpatient [101]  PT Acc Code (Do Not Modify): Private [1]       B Medical/Surgery History Past Medical History:  Diagnosis Date  . Abnormal CT of the head 12/16/1984   R parietal atrophy  . Allergic rhinitis, cause unspecified   . Altered mental status 11/28/2016  . Anemia   . Arthritis    "hands and knees" (09/06/2015)  . Atrial fibrillation (HCC)   . Cellulitis 09/07/2015  . Cellulitis and abscess of leg 09/2016   bilateral  . CHF (congestive heart failure) (HCC)   . Diaphragmatic hernia without mention of obstruction or gangrene   . Dyspnea   . Exertional dyspnea 06/22/12  . Family history of adverse reaction to anesthesia    "daughter fights w/them; just can't relax during OR; stay awake during the OR"  . Fatigue 06/22/2012  . Female stress incontinence   . GERD (gastroesophageal reflux disease)   . Grand mal seizure (HCC)   . H/O hiatal hernia    two removed  . Heart murmur   . High cholesterol   . History of blood transfusion 1956   "related to nose bleed"  . Hypertension   . Influenza A 01/11/2014  . Lichenification and lichen simplex chronicus   .  Migraine    "when I was a teenager"  . Morbid obesity (HCC)   . Nonischemic cardiomyopathy (HCC)    EF has normalized-repeat Pending   . On home oxygen therapy    "2L when I'm asleep in bed" (09/06/2015)  . OSA on CPAP   . Pacemaker  st judes    Patient states "this is my third pacemaker"  . Pneumonia 2004; 2011; 11/2011  . Seizures (HCC) since 1981   "can hear you talking but sounds like you are in big tunnel; have them often if not taking RX; last one was 06/17/12" (09/06/2015)  . Shortness of breath  10/24/2010   Qualifier: Diagnosis of  By: [place], Bonnie    . Sick sinus syndrome (HCC)    WITH PRIOR DDD PACEMAKER IMPLANTATION  . Somnolence   . Stroke [facility]) 1988   "mouth drawed real bad on my left side; found out it was a seizure"  . Syncope and collapse 06/22/12   "hit forehead and left knee"; denies loss of consciousness  . Unspecified venous (peripheral) insufficiency    Past Surgical History:  Procedure Laterality Date  . APPENDECTOMY    . CARDIAC CATHETERIZATION N/A 10/03/2016   Procedure: Right/Left Heart Cath and Coronary Angiography;  Surgeon: Dolores Patty, MD;  [facility] INVASIVE CV LAB;  Service: Cardiovascular;  Laterality: N/A;  . HERNIA REPAIR  ? 1988; 04/15/1998  . INSERT / REPLACE / REMOVE PACEMAKER  12/16/1990   DDD EF 30%;  Marland Kitchen INSERT / REPLACE / REMOVE PACEMAKER  07/25/2003   replaced by BB, leads are both IS1 and from 1992  . INSERT / REPLACE / REMOVE PACEMAKER  05/21/2011   JA; generator change  . LEG SKIN LESION  BIOPSY / EXCISION  05/16/2001   Lichen Planus  . VENTRAL HERNIA REPAIR  1989; 04/15/1998     A IV Location/Drains/Wounds Patient Lines/Drains/Airways Status   Active Line/Drains/Airways    Name:   Placement date:   Placement time:   Site:   Days:   Peripheral IV 11/08/19 Left Hand   11/08/19    1334    Hand   less than 1   Peripheral IV 11/08/19 Right Antecubital   11/08/19    1354    Antecubital   less than 1   External Urinary Catheter   11/08/19    1502    -   less than 1   Incision (Closed) 10/03/16 Groin Right   10/03/16    1630     1131          Intake/Output Last 24 hours  Intake/Output Summary (Last 24 hours) at 11/08/2019 1946 Last data filed at 11/08/2019 1731 Gross per 24 hour  Intake 1554.18 ml  Output -  Net 1554.18 ml    Labs/Imaging Results for orders placed or performed during the hospital encounter of 11/08/19 (from the past 48 hour(s))  Lactic acid, plasma     Status: Abnormal   Collection Time: 11/08/19  1:49 PM   Result Value Ref Range   Lactic Acid, Venous 4.0 (HH) 0.5 - 1.9 mmol/L    Comment: CRITICAL RESULT CALLED TO, READ BACK BY AND VERIFIED WITH: BRANDY LEAK,RN AT 1432 11/08/2019 BY ZBEECH. Performed at [facility], [address], [place], [place] [address]   Comprehensive metabolic panel     Status: Abnormal   Collection Time: 11/08/19  1:49 PM  Result Value Ref Range   Sodium 139 135 - 145 mmol/L  Potassium 3.1 (L) 3.5 - 5.1 mmol/L   Chloride 107 98 - 111 mmol/L   CO2 21 (L) 22 - 32 mmol/L   Glucose, Bld 144 (H) 70 - 99 mg/dL   BUN 13 8 - 23 mg/dL   Creatinine, Ser 1.611.27 (H) 0.44 - 1.00 mg/dL   Calcium 9.0 8.9 - 09.610.3 mg/dL   Total Protein 6.6 6.5 - 8.1 g/dL   Albumin 3.0 (L) 3.5 - 5.0 g/dL   AST 26 15 - 41 U/L   ALT 12 0 - 44 U/L   Alkaline Phosphatase 106 38 - 126 U/L   Total Bilirubin 2.0 (H) 0.3 - 1.2 mg/dL   GFR calc non Af Amer 43 (L) >60 mL/min   GFR calc Af Amer 50 (L) >60 mL/min   Anion gap 11 5 - 15    Comment: Performed at [facility], [address] [place], [place] [address]  CBC WITH DIFFERENTIAL     Status: Abnormal   Collection Time: 11/08/19  1:49 PM  Result Value Ref Range   WBC 15.2 (H) 4.0 - 10.5 K/uL   RBC 4.54 3.87 - 5.11 MIL/uL   Hemoglobin 13.8 12.0 - 15.0 g/dL   HCT 98.142.1 19.136.0 - 47.846.0 %   MCV 92.7 80.0 - 100.0 fL   MCH 30.4 26.0 - 34.0 pg   MCHC 32.8 30.0 - 36.0 g/dL   RDW 29.516.3 (H) 62.111.5 - 30.815.5 %   Platelets 97 (L) 150 - 400 K/uL    Comment: REPEATED TO VERIFY PLATELET COUNT CONFIRMED BY SMEAR Immature Platelet Fraction may be clinically indicated, consider ordering this additional test [id number]    nRBC 0.0 0.0 - 0.2 %   Neutrophils Relative % 94 %   Neutro Abs 14.3 (H) 1.7 - 7.7 K/uL   Lymphocytes Relative 2 %   Lymphs Abs 0.3 (L) 0.7 - 4.0 K/uL   Monocytes Relative 3 %   Monocytes Absolute 0.5 0.1 - 1.0 K/uL   Eosinophils Relative 0 %   Eosinophils Absolute 0.0 0.0 - 0.5 K/uL   Basophils Relative 0 %   Basophils Absolute  0.0 0.0 - 0.1 K/uL   Immature Granulocytes 1 %   Abs Immature Granulocytes 0.21 (H) 0.00 - 0.07 K/uL    Comment: Performed at [facility], [address] [place], [place] [address]  APTT     Status: None   Collection Time: 11/08/19  1:49 PM  Result Value Ref Range   aPTT 34 24 - 36 seconds    Comment: Performed at [facility], [address] [place], [place] [address]  Protime-INR     Status: Abnormal   Collection Time: 11/08/19  1:49 PM  Result Value Ref Range   Prothrombin Time 19.6 (H) 11.4 - 15.2 seconds   INR 1.7 (H) 0.8 - 1.2    Comment: (NOTE) INR goal varies based on device and disease states. Performed at [facility], [address] [place], [place] [address]   Brain natriuretic peptide     Status: Abnormal   Collection Time: 11/08/19  1:50 PM  Result Value Ref Range   B Natriuretic Peptide 4,306.1 (H) 0.0 - 100.0 pg/mL    Comment: Performed at [facility], [address] [place], [place] [address]  POCT I-Stat EG7     Status: Abnormal   Collection Time: 11/08/19  2:10 PM  Result Value Ref Range   pH, Ven 7.431 (H) 7.250 - 7.430  pCO2, Ven 31.5 (L) 44.0 - 60.0 mmHg   pO2, Ven 40.0 32.0 - 45.0 mmHg   Bicarbonate 21.0 20.0 - 28.0 mmol/L   TCO2 22 22 - 32 mmol/L   O2 Saturation 77.0 %   Acid-base deficit 2.0 0.0 - 2.0 mmol/L   Sodium 140 135 - 145 mmol/L   Potassium 3.1 (L) 3.5 - 5.1 mmol/L   Calcium, Ion 1.15 1.15 - 1.40 mmol/L   HCT 42.0 36.0 - 46.0 %   Hemoglobin 14.3 12.0 - 15.0 g/dL   Patient temperature HIDE    Sample type VENOUS   Procalcitonin     Status: None   Collection Time: 11/08/19  2:21 PM  Result Value Ref Range   Procalcitonin 0.97 ng/mL    Comment:        Interpretation: PCT > 0.5 ng/mL and <= 2 ng/mL: Systemic infection (sepsis) is possible, but other conditions are known to elevate PCT as well. (NOTE)       Sepsis PCT Algorithm           Lower Respiratory Tract                                       Infection PCT Algorithm    ----------------------------     ----------------------------         PCT < 0.25 ng/mL                PCT < 0.10 ng/mL         Strongly encourage             Strongly discourage   discontinuation of antibiotics    initiation of antibiotics    ----------------------------     -----------------------------       PCT 0.25 - 0.50 ng/mL            PCT 0.10 - 0.25 ng/mL               OR       >80% decrease in PCT            Discourage initiation of                                            antibiotics      Encourage discontinuation           of antibiotics    ----------------------------     -----------------------------         PCT >= 0.50 ng/mL              PCT 0.26 - 0.50 ng/mL                AND       <80% decrease in PCT             Encourage initiation of                                             antibiotics       Encourage continuation           of antibiotics    ----------------------------     -----------------------------  PCT >= 0.50 ng/mL                  PCT > 0.50 ng/mL               AND         increase in PCT                  Strongly encourage                                      initiation of antibiotics    Strongly encourage escalation           of antibiotics                                     -----------------------------                                           PCT <= 0.25 ng/mL                                                 OR                                        > 80% decrease in PCT                                     Discontinue / Do not initiate                                             antibiotics Performed at [facility], [address], [place], [place] [address]   Troponin I (High Sensitivity)     Status: Abnormal   Collection Time: 11/08/19  2:21 PM  Result Value Ref Range   Troponin I (High Sensitivity) 53 (H) <18 ng/L    Comment: (NOTE) Elevated high sensitivity troponin I (hsTnI) values and  significant  changes across serial measurements may suggest ACS but many other  chronic and acute conditions are known to elevate hsTnI results.  Refer to the "Links" section for chest pain algorithms and additional  guidance. Performed at [facility], [address] [place], [place] [address]   POC SARS Coronavirus 2 Ag-ED -     Status: None   Collection Time: 11/08/19  2:41 PM  Result Value Ref Range   SARS Coronavirus 2 Ag NEGATIVE NEGATIVE    Comment: (NOTE) SARS-CoV-2 antigen NOT DETECTED.  Negative results are presumptive.  Negative results do not preclude SARS-CoV-2 infection and should not be used as the sole basis for treatment or other patient management decisions, including infection  control decisions, particularly in the presence of clinical signs and  symptoms consistent with COVID-19, or in those who have been in  contact with the virus.  Negative results must be combined with clinical observations, patient history, and epidemiological information. The expected result is Negative. Fact Sheet for Patients: https://sanders-williams.net/ Fact Sheet for Healthcare Providers: https://martinez.com/ This test is not yet approved or cleared by the [place] FDA and  has been authorized for detection and/or diagnosis of SARS-CoV-2 by FDA under an Emergency Use Authorization (EUA).  This EUA will remain in effect (meaning this test can be used) for the duration of  the COVID-19 de claration under Section 564(b)(1) of the Act, 21 U.S.C. section 360bbb-3(b)(1), unless the authorization is terminated or revoked sooner.   Lactic acid, plasma     Status: Abnormal   Collection Time: 11/08/19  4:34 PM  Result Value Ref Range   Lactic Acid, Venous 2.7 (HH) 0.5 - 1.9 mmol/L    Comment: CRITICAL VALUE NOTED.  VALUE IS CONSISTENT WITH PREVIOUSLY REPORTED AND CALLED VALUE. Performed at [facility], [address] [place], [place]  [address]   Troponin I (High Sensitivity)     Status: Abnormal   Collection Time: 11/08/19  4:34 PM  Result Value Ref Range   Troponin I (High Sensitivity) 45 (H) <18 ng/L    Comment: (NOTE) Elevated high sensitivity troponin I (hsTnI) values and significant  changes across serial measurements may suggest ACS but many other  chronic and acute conditions are known to elevate hsTnI results.  Refer to the "Links" section for chest pain algorithms and additional  guidance. Performed at [facility], [address], [place], [place] [address]    Dg Chest Port 1 View  Result Date: 11/08/2019 CLINICAL DATA:  Sepsis. EXAM: PORTABLE CHEST 1 VIEW COMPARISON:  November 28, 2016 FINDINGS: There is consolidation in the left lower lobe with small left pleural effusion. Lungs elsewhere are clear. There is cardiomegaly with pulmonary vascularity normal. Pacemaker leads attached to right atrium and right ventricle. No adenopathy. No bone lesions. IMPRESSION: Left lower lobe consolidation consistent with pneumonia with small left pleural effusion. Lungs elsewhere clear. There is cardiomegaly. Pacemaker leads attached right atrium and right ventricle. No adenopathy. Electronically Signed   By: Bretta Bang III M.D.   On: 11/08/2019 14:02    Pending Labs Unresulted Labs (From admission, onward)    Start     Ordered   11/08/19 1500  SARS CORONAVIRUS 2 (TAT 6-24 HRS) Nasopharyngeal Nasopharyngeal Swab  (Asymptomatic/Tier 3)  Once,   STAT    Question Answer Comment  Is this test for diagnosis or screening Diagnosis of ill patient   Symptomatic for COVID-19 as defined by CDC Yes   Date of Symptom Onset 11/05/2019   Hospitalized for COVID-19 Yes   Admitted to ICU for COVID-19 No   Previously tested for COVID-19 Yes   Resident in a congregate (group) care setting No   Employed in healthcare setting No   Pregnant No      11/08/19 1459   11/08/19 1349  Blood Culture (routine x 2)  BLOOD CULTURE X 2,    STAT     11/08/19 1349   11/08/19 1349  Urinalysis, Routine w reflex microscopic  ONCE - STAT,   STAT     11/08/19 1349   11/08/19 1349  Urine culture  ONCE - STAT,   STAT     11/08/19 1349          Vitals/Pain Today's Vitals   11/08/19 1819 11/08/19 1830 11/08/19 1940 11/08/19 1941  BP:  119/66 108/67  Pulse:    69  Resp:  (!) 27 18   Temp:      TempSrc:      SpO2:    100%  Weight:      Height:      PainSc: 0-No pain       Isolation Precautions No active isolations  Medications Medications  cefTRIAXone (ROCEPHIN) 2 g in sodium chloride 0.9 % 100 mL IVPB (0 g Intravenous Stopped 11/08/19 1506)  vancomycin (VANCOCIN) 2,000 mg in sodium chloride 0.9 % 500 mL IVPB (has no administration in time range)  acetaminophen (TYLENOL) tablet 1,000 mg (0 mg Oral Hold 11/08/19 1618)  metroNIDAZOLE (FLAGYL) IVPB 500 mg (0 mg Intravenous Stopped 11/08/19 1539)  vancomycin (VANCOCIN) 2,500 mg in sodium chloride 0.9 % 500 mL IVPB (0 mg Intravenous Stopped 11/08/19 1635)  sodium chloride 0.9 % bolus 1,000 mL (0 mLs Intravenous Stopped 11/08/19 1731)  sodium chloride 0.9 % bolus 1,000 mL (1,000 mLs Intravenous New Bag/Given 11/08/19 1802)    Mobility walks with device Moderate fall risk   Focused Assessments     R Recommendations: See Admitting Provider Note  Report given to:   Additional Notes:

## 2019-11-08 NOTE — ED Provider Notes (Signed)
[facility]   CSN: [id number] Arrival date & time: 11/08/19  1330     History   Chief Complaint Weakness  HPI Misty Gray is a 68 y.o. female with a history of A. Fib, E. coli UTI, COPD on 2 L baseline nasal cannula, CHF, cellulitis of lower extremities, presented to emergency department with fever, fatigue, mechanical fall.  Patient reports that this morning she was getting out of bed and began to feel weak and slid down to the ground.  She denies that she fell.  She is just little on the edge of the bed onto the floor.  She was then too weak to get herself up and called for help.  Her family members were able to help her up.  She denies striking her head or headache.  She is on blood thinners - savaysa.  She reports she has chronic cellulitis but feels like her right lower extremity is hotter and warmer than usual.  She reports a cough and congestion, with difficulty breathing last night.  She is supposed to be on 2 L nasal cannula all the time, but reports she did not receive the O2 at home or has difficulty administering it.  Therefore EMS reports when they found her she was not wearing her oxygen.  EMS reports that she was satting 93% on room air.  She was tachypneic and febrile to 101.13F orally, and tachycardic, and therefore they initiated CODE SEPSIS en route.  They gave her 1000 mg tylenol.  Patient denies any diarrhea.  She reports she was nauseated yesterday and difficulty keeping down food.  She does have a history of C. difficile approximately 4 years ago in 2016.  Allergies to penicillins and aspirin     HPI  Past Medical History:  Diagnosis Date  . Abnormal CT of the head 12/16/1984   R parietal atrophy  . Allergic rhinitis, cause unspecified   . Altered mental status 11/28/2016  . Anemia   . Arthritis    "hands and knees" (09/06/2015)  . Atrial fibrillation (HCC)   . Cellulitis 09/07/2015  . Cellulitis and  abscess of leg 09/2016   bilateral  . CHF (congestive heart failure) (HCC)   . Diaphragmatic hernia without mention of obstruction or gangrene   . Dyspnea   . Exertional dyspnea 06/22/12  . Family history of adverse reaction to anesthesia    "daughter fights w/them; just can't relax during OR; stay awake during the OR"  . Fatigue 06/22/2012  . Female stress incontinence   . GERD (gastroesophageal reflux disease)   . Grand mal seizure (HCC)   . H/O hiatal hernia    two removed  . Heart murmur   . High cholesterol   . History of blood transfusion 1956   "related to nose bleed"  . Hypertension   . Influenza A 01/11/2014  . Lichenification and lichen simplex chronicus   . Migraine    "when I was a teenager"  . Morbid obesity (HCC)   . Nonischemic cardiomyopathy (HCC)    EF has normalized-repeat Pending   . On home oxygen therapy    "2L when I'm asleep in bed" (09/06/2015)  . OSA on CPAP   . Pacemaker  st judes    Patient states "this is my third pacemaker"  . Pneumonia 2004; 2011; 11/2011  . Seizures (HCC) since 1981   "can hear you talking but sounds like you are in big tunnel; have them often  if not taking RX; last one was 06/17/12" (09/06/2015)  . Shortness of breath 10/24/2010   Qualifier: Diagnosis of  By: [place], Bonnie    . Sick sinus syndrome (HCC)    WITH PRIOR DDD PACEMAKER IMPLANTATION  . Somnolence   . Stroke [facility](HCC) 1988   "mouth drawed real bad on my left side; found out it was a seizure"  . Syncope and collapse 06/22/12   "hit forehead and left knee"; denies loss of consciousness  . Unspecified venous (peripheral) insufficiency     Patient Active Problem List   Diagnosis Date Noted  . Abdominal wall hernia 11/05/2017  . Anticoagulated 11/05/2017  . Rash and nonspecific skin eruption 09/15/2017  . Ventral hernia without obstruction or gangrene 08/15/2017  . Skin irritation 07/23/2017  . Chronic venous stasis dermatitis 12/26/2016  . Urinary tract infection without  hematuria   . Frequent PVCs   . Hypokalemia   . Housing problems 11/23/2016  . Chronic atrial fibrillation (HCC) 11/01/2016  . Hypotension 10/31/2016  . Special screening for malignant neoplasms, colon 08/12/2016  . Heme positive stool 08/09/2016  . Intertrigo 08/09/2016  . Chronic anticoagulation 04/26/2016  . Chronic venous insufficiency   . Preventative health care 10/05/2014  . Breast mass, left 10/05/2014  . Osteopenia 09/15/2014  . Hypertension 01/04/2014  . Chronic systolic dysfunction of left ventricle 05/05/2013  . At high risk for falls 02/11/2013  . Vitamin D deficiency 07/15/2012  . Sinoatrial node dysfunction (HCC) 05/08/2011  . Pacemaker 08/10/2009  . Allergic rhinitis 05/31/2008  . Morbid obesity (HCC) 02/12/2007  . GASTROESOPHAGEAL REFLUX, NO ESOPHAGITIS 02/12/2007  . Female stress incontinence 02/12/2007  . Seizure disorder (HCC) 02/12/2007  . OSA (obstructive sleep apnea) 02/12/2007    Past Surgical History:  Procedure Laterality Date  . APPENDECTOMY    . CARDIAC CATHETERIZATION N/A 10/03/2016   Procedure: Right/Left Heart Cath and Coronary Angiography;  Surgeon: Dolores Pattyaniel R [name], MD;  [facility];  Service: Cardiovascular;  Laterality: N/A;  . HERNIA REPAIR  ? 1988; 04/15/1998  . INSERT / REPLACE / REMOVE PACEMAKER  12/16/1990   DDD EF 30%;  Marland Kitchen. INSERT / REPLACE / REMOVE PACEMAKER  07/25/2003   replaced by BB, leads are both IS1 and from 1992  . INSERT / REPLACE / REMOVE PACEMAKER  05/21/2011   JA; generator change  . LEG SKIN LESION  BIOPSY / EXCISION  05/16/2001   Lichen Planus  . VENTRAL HERNIA REPAIR  1989; 04/15/1998     OB History   No obstetric history on file.      Home Medications    Prior to Admission medications   Medication Sig Start Date End Date Taking? Authorizing Provider  amiodarone (PACERONE) 200 MG tablet TAKE 1 TABLET BY MOUTH ONCE DAILY. 06/21/19   [name], Bevelyn Bucklesaniel R, MD  cholecalciferol (VITAMIN D) 1000 units tablet  Take 1 tablet (1,000 Units total) by mouth daily. 03/20/18   Latrelle DodrillMcIntyre, Brittany J, MD  fluticasone (FLONASE) 50 MCG/ACT nasal spray Place 2 sprays into both nostrils daily as needed. For congestion. 07/28/17   Latrelle DodrillMcIntyre, Brittany J, MD  omeprazole (PRILOSEC) 20 MG capsule TAKE (1) CAPSULE BY MOUTH ONCE DAILY. 09/06/19   Latrelle DodrillMcIntyre, Brittany J, MD  PHENobarbital (LUMINAL) 32.4 MG tablet TAKE (2) TABLETS BY MOUTH TWICE DAILY. (MORNING AND BEDTIME) 08/05/19   Latrelle DodrillMcIntyre, Brittany J, MD  potassium chloride (K-DUR) 10 MEQ tablet TAKE 4 TABLETS BY MOUTH THREE TIMES DAILY (MORNING, NOON, AND NIGHT) 09/07/19   Marca AnconaMcLean, Dalton  S, MD  SAVAYSA 60 MG TABS tablet TAKE 1 TABLET BY MOUTH ONCE DAILY. 09/07/19   [name], [name], MD  torsemide (DEMADEX) 20 MG tablet TAKE (2) TABLETS BY MOUTH TWICE DAILY. 10/04/19   [name], [name], MD  triamcinolone ointment (KENALOG) 0.5 % Apply 1 application topically 3 (three) times daily. 09/15/17   Marquette Saa, MD    Family History Family History  Problem Relation Age of Onset  . Stroke Father        died from CAD?  Marland Kitchen Heart disease Father   . Cancer Mother   . Diabetes Son        Type 1  . Sleep apnea Son   . Cancer Sister        cervical  . Hypertension Sister   . Hypertension Brother   . Rheum arthritis Daughter        Also PGM, PGGM    Social History Social History   Tobacco Use  . Smoking status: Former Smoker    Packs/day: 1.00    Years: 7.00    Pack years: 7.00    Types: Cigarettes    Quit date: 03/16/1969    Years since quitting: 50.6  . Smokeless tobacco: Never Used  Substance Use Topics  . Alcohol use: No  . Drug use: No     Allergies   Aspirin, Penicillins, Tape, Wool alcohol [lanolin], Amoxicillin, and Ampicillin   Review of Systems Review of Systems  Constitutional: Positive for appetite change and fatigue. Negative for chills and fever.  HENT: Positive for congestion. Negative for ear pain and sore throat.   Respiratory:  Positive for cough and shortness of breath.   Cardiovascular: Positive for leg swelling. Negative for chest pain and palpitations.  Gastrointestinal: Positive for nausea. Negative for abdominal pain and vomiting.  Genitourinary: Negative for difficulty urinating, dysuria, flank pain and hematuria.  Neurological: Positive for weakness. Negative for syncope, light-headedness and headaches.  All other systems reviewed and are negative.    Physical Exam Updated Vital Signs BP (!) 88/55   Pulse 89   Temp (!) 101.5 F (38.6 C) (Oral)   Resp (!) 28   Ht 5\' 4"  (1.626 m)   Wt 128.4 kg   SpO2 97%   BMI 48.58 kg/m   Physical Exam Vitals signs and nursing note reviewed.  Constitutional:      General: She is not in acute distress.    Appearance: She is well-developed.  HENT:     Head: Normocephalic and atraumatic.  Eyes:     Conjunctiva/sclera: Conjunctivae normal.  Neck:     Musculoskeletal: Neck supple.  Cardiovascular:     Rate and Rhythm: Tachycardia present. Rhythm irregular.     Pulses: Normal pulses.  Pulmonary:     Effort: Pulmonary effort is normal. No respiratory distress.     Comments: 95% on room air Abdominal:     General: There is no distension.     Palpations: Abdomen is soft.     Tenderness: There is no abdominal tenderness.  Skin:    General: Skin is warm and dry.     Comments: Warm and erythema of right lower extremity with crusted-over wound to the medial aspect of lower leg, no crepitus  Neurological:     Mental Status: She is alert.  Psychiatric:        Mood and Affect: Mood normal.        Behavior: Behavior normal.      ED Treatments /  Results  Labs (all labs ordered are listed, but only abnormal results are displayed) Labs Reviewed  LACTIC ACID, PLASMA - Abnormal; Notable for the following components:      Result Value   Lactic Acid, Venous 4.0 (*)    All other components within normal limits  COMPREHENSIVE METABOLIC PANEL - Abnormal; Notable  for the following components:   Potassium 3.1 (*)    CO2 21 (*)    Glucose, Bld 144 (*)    Creatinine, Ser 1.27 (*)    Albumin 3.0 (*)    Total Bilirubin 2.0 (*)    GFR calc non Af Amer 43 (*)    GFR calc Af Amer 50 (*)    All other components within normal limits  CBC WITH DIFFERENTIAL/PLATELET - Abnormal; Notable for the following components:   WBC 15.2 (*)    RDW 16.3 (*)    Platelets 97 (*)    Neutro Abs 14.3 (*)    Lymphs Abs 0.3 (*)    Abs Immature Granulocytes 0.21 (*)    All other components within normal limits  PROTIME-INR - Abnormal; Notable for the following components:   Prothrombin Time 19.6 (*)    INR 1.7 (*)    All other components within normal limits  BRAIN NATRIURETIC PEPTIDE - Abnormal; Notable for the following components:   B Natriuretic Peptide 4,306.1 (*)    All other components within normal limits  POCT I-STAT EG7 - Abnormal; Notable for the following components:   pH, Ven 7.431 (*)    pCO2, Ven 31.5 (*)    Potassium 3.1 (*)    All other components within normal limits  TROPONIN I (HIGH SENSITIVITY) - Abnormal; Notable for the following components:   Troponin I (High Sensitivity) 53 (*)    All other components within normal limits  CULTURE, BLOOD (ROUTINE X 2)  CULTURE, BLOOD (ROUTINE X 2)  URINE CULTURE  SARS CORONAVIRUS 2 (TAT 6-24 HRS)  APTT  PROCALCITONIN  LACTIC ACID, PLASMA  URINALYSIS, ROUTINE W REFLEX MICROSCOPIC  POC SARS CORONAVIRUS 2 AG -  ED  I-STAT VENOUS BLOOD GAS, ED  TROPONIN I (HIGH SENSITIVITY)    EKG None  Radiology Dg Chest Port 1 View  Result Date: 11/08/2019 CLINICAL DATA:  Sepsis. EXAM: PORTABLE CHEST 1 VIEW COMPARISON:  November 28, 2016 FINDINGS: There is consolidation in the left lower lobe with small left pleural effusion. Lungs elsewhere are clear. There is cardiomegaly with pulmonary vascularity normal. Pacemaker leads attached to right atrium and right ventricle. No adenopathy. No bone lesions. IMPRESSION:  Left lower lobe consolidation consistent with pneumonia with small left pleural effusion. Lungs elsewhere clear. There is cardiomegaly. Pacemaker leads attached right atrium and right ventricle. No adenopathy. Electronically Signed   By: Lowella Grip III M.D.   On: 11/08/2019 14:02    Procedures .Critical Care Performed by: Wyvonnia Dusky, MD Authorized by: Wyvonnia Dusky, MD   Critical care provider statement:    Critical care time (minutes):  50   Critical care was necessary to treat or prevent imminent or life-threatening deterioration of the following conditions:  Sepsis   Critical care was time spent personally by me on the following activities:  Discussions with consultants, evaluation of patient's response to treatment, examination of patient, ordering and performing treatments and interventions, ordering and review of laboratory studies, ordering and review of radiographic studies, pulse oximetry, re-evaluation of patient's condition, obtaining history from patient or surrogate and review of old charts Comments:  IV antibiotics, respiratory monitoring   (including critical care time)  Medications Ordered in ED Medications  metroNIDAZOLE (FLAGYL) IVPB 500 mg (500 mg Intravenous New Bag/Given 11/08/19 1439)  vancomycin (VANCOCIN) 2,500 mg in sodium chloride 0.9 % 500 mL IVPB (2,500 mg Intravenous New Bag/Given 11/08/19 1435)  cefTRIAXone (ROCEPHIN) 2 g in sodium chloride 0.9 % 100 mL IVPB (0 g Intravenous Stopped 11/08/19 1506)  vancomycin (VANCOCIN) 2,000 mg in sodium chloride 0.9 % 500 mL IVPB (has no administration in time range)  sodium chloride 0.9 % bolus 1,000 mL (1,000 mLs Intravenous New Bag/Given 11/08/19 1439)     Initial Impression / Assessment and Plan / ED Course  I have reviewed the triage vital signs and the nursing notes.  Pertinent labs & imaging results that were available during my care of the patient were reviewed by me and considered in my  medical decision making (see chart for details).  68 yo female here as CODE SEPSIS with cellulitis of the LLE.  Some reported congestion and coughing and SOB as well.  Plan for infectious w/u including COVID testing, Xray, UA  BS antibiotics for sepsis initiation Blood cx to be drawn  No evidence of acute respiratory distress or failure at this time.  Patient stable on room air  Airborne precautions   Clinical Course as of Nov 08 1535  Mon Nov 08, 2019  1349 Febrile (101.77F), tachypneic, tachycardic, sepsis alert   [MT]  1423 pCO2, Ven(!): 31.5 [MT]  1458 Hypotensive but mentating well, getting fluids now   [MT]  1535 Patient signed out to Dr Juleen China with plan to reassess BP after 1L IVF, if still hypotensive consider SDU admission   [MT]    Clinical Course User Index [MT] Jaydy Fitzhenry, Kermit Balo, MD     Final Clinical Impressions(s) / ED Diagnoses   Final diagnoses:  None    ED Discharge Orders    None       Terald Sleeper, MD 11/08/19 1536

## 2019-11-08 NOTE — ED Notes (Signed)
This RN attempted to call report at [place]; Floor RN said they would need to call back. This RN asked that floor RN read handoff report and call back with any questions. Will call back if floor RN has not reached out by 2005

## 2019-11-09 DIAGNOSIS — A419 Sepsis, unspecified organism: Principal | ICD-10-CM

## 2019-11-09 DIAGNOSIS — J189 Pneumonia, unspecified organism: Secondary | ICD-10-CM

## 2019-11-09 LAB — URINALYSIS, ROUTINE W REFLEX MICROSCOPIC
Bilirubin Urine: NEGATIVE
Glucose, UA: NEGATIVE mg/dL
Hgb urine dipstick: NEGATIVE
Ketones, ur: NEGATIVE mg/dL
Leukocytes,Ua: NEGATIVE
Nitrite: NEGATIVE
Protein, ur: NEGATIVE mg/dL
Specific Gravity, Urine: 1.011 (ref 1.005–1.030)
pH: 5 (ref 5.0–8.0)

## 2019-11-09 LAB — BASIC METABOLIC PANEL
Anion gap: 9 (ref 5–15)
BUN: 13 mg/dL (ref 8–23)
CO2: 24 mmol/L (ref 22–32)
Calcium: 8.8 mg/dL — ABNORMAL LOW (ref 8.9–10.3)
Chloride: 107 mmol/L (ref 98–111)
Creatinine, Ser: 0.96 mg/dL (ref 0.44–1.00)
GFR calc Af Amer: >60 mL/min (ref >60)
GFR calc non Af Amer: 60 mL/min — ABNORMAL LOW (ref >60)
Glucose, Bld: 105 mg/dL — ABNORMAL HIGH (ref 70–99)
Potassium: 3.2 mmol/L — ABNORMAL LOW (ref 3.5–5.1)
Sodium: 140 mmol/L (ref 135–145)

## 2019-11-09 LAB — CBC
HCT: 40.1 % (ref 36.0–46.0)
Hemoglobin: 13.1 g/dL (ref 12.0–15.0)
MCH: 30.4 pg (ref 26.0–34.0)
MCHC: 32.7 g/dL (ref 30.0–36.0)
MCV: 93 fL (ref 80.0–100.0)
Platelets: 83 10*3/uL — ABNORMAL LOW (ref 150–400)
RBC: 4.31 MIL/uL (ref 3.87–5.11)
RDW: 16.4 % — ABNORMAL HIGH (ref 11.5–15.5)
WBC: 11.7 10*3/uL — ABNORMAL HIGH (ref 4.0–10.5)
nRBC: 0 % (ref 0.0–0.2)

## 2019-11-09 LAB — MAGNESIUM: Magnesium: 1.9 mg/dL (ref 1.7–2.4)

## 2019-11-09 LAB — PHENOBARBITAL LEVEL: Phenobarbital: 24.1 ug/mL (ref 15.0–30.0)

## 2019-11-09 LAB — MRSA PCR SCREENING: MRSA by PCR: POSITIVE — AB

## 2019-11-09 LAB — HIV ANTIBODY (ROUTINE TESTING W REFLEX): HIV Screen 4th Generation wRfx: NONREACTIVE

## 2019-11-09 LAB — LACTIC ACID, PLASMA
Lactic Acid, Venous: 1.7 mmol/L (ref 0.5–1.9)
Lactic Acid, Venous: 2.8 mmol/L (ref 0.5–1.9)

## 2019-11-09 MED ORDER — AZITHROMYCIN 500 MG PO TABS
500.0000 mg | ORAL_TABLET | Freq: Every day | ORAL | Status: DC
  Administered 2019-11-09 – 2019-11-10 (×2): 500 mg via ORAL
  Filled 2019-11-09 (×2): qty 1

## 2019-11-09 MED ORDER — CHLORHEXIDINE GLUCONATE CLOTH 2 % EX PADS: 6.0000 | MEDICATED_PAD | Freq: Every day | CUTANEOUS | Status: DC

## 2019-11-09 MED ORDER — EDOXABAN TOSYLATE 30 MG PO TABS
60.0000 mg | ORAL_TABLET | ORAL | Status: DC
  Administered 2019-11-09 – 2019-11-12 (×4): 60 mg via ORAL
  Filled 2019-11-09 (×4): qty 2

## 2019-11-09 MED ORDER — MUPIROCIN 2 % EX OINT
1.0000 "application " | TOPICAL_OINTMENT | Freq: Two times a day (BID) | CUTANEOUS | Status: DC
  Administered 2019-11-09 – 2019-11-12 (×8): 1 via NASAL
  Filled 2019-11-09: qty 22

## 2019-11-09 MED ORDER — PANTOPRAZOLE SODIUM 40 MG PO TBEC
40.0000 mg | DELAYED_RELEASE_TABLET | Freq: Every day | ORAL | Status: DC
  Administered 2019-11-10 – 2019-11-12 (×3): 40 mg via ORAL
  Filled 2019-11-09 (×3): qty 1

## 2019-11-09 MED ORDER — POTASSIUM CHLORIDE CRYS ER 20 MEQ PO TBCR
40.0000 meq | EXTENDED_RELEASE_TABLET | Freq: Three times a day (TID) | ORAL | Status: DC
  Administered 2019-11-09 – 2019-11-12 (×9): 40 meq via ORAL
  Filled 2019-11-09 (×9): qty 2

## 2019-11-09 NOTE — Progress Notes (Signed)
Family Medicine Teaching Service Daily Progress Note Intern Pager: [phone]  [name] Medical record number: [id number] Date of birth: [birth year] Age: 68 y.o. Gender: female  Primary Care Provider: Latrelle Dodrill, MD Consultants: None Code Status: DNI  Pt Overview and Major Events to Date:  11/08/19: Admitted, CTX, Vancomycin, and Flagyl  11/09/19: Discontined Vanc and Flagyl. Start Azithromycin.    Assessment and Plan: Misty Gray is a 68 y.o. female  presenting with fever and fatigue with a mechanical fall. PMH is significant for A. Fib,  pacemaker in place, Sleep apnea on 2L baseline [place], HFrEF, cellulitis of lower extremities, C. Diff infection (2016), and Sick sinus syndrome with pacemaker.  Sepsis 2/2 Community Acquired Pneumonia Vital signs stable this AM. Patient remained afebrile overnight although she was febrile on admission Tmax 101.45F. Leukocytosis with left shift down-trending (WBC 15.2 to 11.7). Thrombocytopenia worsening today 97 on admission to 83 today. Lactic acid down-trending, 4 on admission and 1.7 this morning. Blood culture and urine cultures are pending. UA without evidence of infection. MRSA PCR positive. LLL pneumonia with left pleural effusion and cardiomegaly seen on CXR. COVID tensting was negative.  - Continue CTX (11/23 - ) - Discontinue Flagyl and Vanc  - Start Azithromycin  - Follow up BCx / UCx  - Continue cardiac monitoring - Continuous pulse oximetry - PT / OT eval and treat   Cellulitis Bilateral lower extremity cellulitis that patient reported recently got worse.  - Continue CTX   HFrEF Last Echo January 2020 showing EF ~25%.  BNP on admission > 4000 and patient received 2 L IV fluid for circulatory support. Home medications include torsemide 40mg  PO BID. Patient received 40 mg oral torsemide yesterday and had 1 unmeasured occurrence.  Will monitor intake and output as well as daily weights.  -Continue torsemide 40mg   PO BID -Hold IV fluids - Daily Weights - Strict Is/Os  HTN Blood pressure normotensive today with BP ranges 79/46 - 119/66 yesterday.  Home medications include Amiodarone 200mg  and Torsemide 40mg  BID. -Monitor BP  -Continue home meds  Hypokalemia Today K 3.2 from K 3.1 on admission. Prescribed TID PO at home.  -PM BMP -Replete with Kdur TID - AM BMP   CKD Stage III On admission Cr 1.27 on admission and today 0.96 with baseline ~1.0. Patient received 2L bolus in ED.  -Avoid nephrotoxic meds - AM BMP  A FIB  Sick Sinus Syndrome  HR 71 - 101 since midnight.   Home medications include  Amiodarone 200 daily. On Edoxaban for anticoagulation. Patient has pacemaker and has [employer] Jude's since 2012. -Continue home dose of amiodarone -Continue edoxaban  Seizure Disorder Home medications include Phenobarbital 64.8 mg twice daily. Missed 1-2 days of antiepiletics as she was not feeling well. Phenobarbital level 24.1.  -Continue phenobarb 64.8 bid   GERD Home medications include Ompeprazole 20mg  daily. - Hold omeprazole - Protonix    FEN/GI: Renal carb modified diet, replete electrolytes as needed  PPx: Edoxaban   Disposition: Pending medical clearance and PT/OT evaluation   Subjective:  had no significant overnight events.   Objective: Temp:  [97.8 F (36.6 C)-101.5 F (38.6 C)] 98.6 F (37 C) (11/24 0714) Pulse Rate:  [25-101] 101 (11/24 0714) Resp:  [17-39] 17 (11/24 0714) BP: (72-119)/(46-78) 109/72 (11/24 0714) SpO2:  [91 %-100 %] 97 % (11/24 0714) Weight:  [128.4 kg] 128.4 kg (11/23 1354)   Physical Exam: General: pleasant elderly female, in  no acute distress sitting upright in chair Cardiovascular: irregularly irregular rhythm and tachycardic Respiratory: clear to ascultation bilaterally, no increased work of breathing  Abdomen: soft, obese abdomen, non tender, bowel sounds present  Extremities: R > L lower extremity edema,  erythematous, warmth  Skin: see above, right LE tibial granulation tissue, no active drainage   Laboratory: Recent Labs  Lab 11/08/19 1349 11/08/19 1410 11/09/19 0220  WBC 15.2*  --  11.7*  HGB 13.8 14.3 13.1  HCT 42.1 42.0 40.1  PLT 97*  --  83*   Recent Labs  Lab 11/08/19 1349 11/08/19 1410 11/09/19 0220  NA 139 140 140  K 3.1* 3.1* 3.2*  CL 107  --  107  CO2 21*  --  24  BUN 13  --  13  CREATININE 1.27*  --  0.96  CALCIUM 9.0  --  8.8*  PROT 6.6  --   --   BILITOT 2.0*  --   --   ALKPHOS 106  --   --   ALT 12  --   --   AST 26  --   --   GLUCOSE 144*  --  105*      Imaging/Diagnostic Tests: Dg Chest Port 1 View  Result Date: 11/08/2019 CLINICAL DATA:  Sepsis. EXAM: PORTABLE CHEST 1 VIEW COMPARISON:  November 28, 2016 FINDINGS: There is consolidation in the left lower lobe with small left pleural effusion. Lungs elsewhere are clear. There is cardiomegaly with pulmonary vascularity normal. Pacemaker leads attached to right atrium and right ventricle. No adenopathy. No bone lesions. IMPRESSION: Left lower lobe consolidation consistent with pneumonia with small left pleural effusion. Lungs elsewhere clear. There is cardiomegaly. Pacemaker leads attached right atrium and right ventricle. No adenopathy. Electronically Signed   By: Lowella Grip III M.D.   On: 11/08/2019 14:02     Lyndee Hensen, MD 11/09/2019, 9:59 AM PGY-1, [place] Intern pager: [phone], text pages welcome

## 2019-11-09 NOTE — Progress Notes (Signed)
Family Medicine Teaching Service Daily Progress Note Intern Pager: [phone]  [name] Medical record number: [id number] Date of birth: Oct 25, 1951 Age: 68 y.o. Gender: female  Primary Care Provider: Latrelle Dodrill, MD Consultants: None Code Status: DNI  Pt Overview and Major Events to Date:  11/08/19: Admitted, CTX, Vancomycin, and Flagyl  11/09/19: Discontined Vanc and Flagyl. Start Azithromycin.    Assessment and Plan: Misty Gray is a 68 y.o. female  presenting with fever and fatigue with a mechanical fall. PMH is significant for A. Fib,  pacemaker in place, Sleep apnea on 2L baseline [place], HFrEF, cellulitis of lower extremities, C. Diff infection (2016), and Sick sinus syndrome with pacemaker.  Sepsis 2/2 Community Acquired Pneumonia Patient continues to be afebrile and vital signs stable.  Satting well on room air.  Uses 2 L nasal cannula at bedtime.  Refuses CPAP at night. Blood cultures preliminary no growth and urine cultures pending.  Leukocytosis has normalized ( WBC 11.7 to 8.3). Continue with CAP antibiotic treatment however will transition to oral therapy. - Discontinue CTX (11/23 - 11/24), switch to cefdinir - Continue Azithromycin (11/24 - ) - Follow up BCx / UCx  - Continue cardiac monitoring - Continuous pulse oximetry - PT / OT eval and treat   Cellulitis Exam grossly unchanged from prior.  Patient with history of chronic venous stasis dermatitis. - Continue CTX   HFrEF Last Echo January 2020 showing EF ~25%.  BNP on admission > 4000 and patient received 2 L IV fluid for circulatory support. Home medications include torsemide 40mg  PO BID. Patient received 80 mg oral torsemide yesterday and had diuresed 2.3 L. Continue to encourage patient to keep LE elevated when at rest.    -Continue torsemide 40mg  PO BID -Hold IV fluids - Daily Weights - Strict Is/Os  HTN Blood pressure 106/74 today with BP ranges 102/66 - 109/84 yesterday.  Home  medications include Amiodarone 200mg  and Torsemide 40mg  BID. -Monitor BP  -Continue home meds  Hypokalemia Today K 3.2 from K 3.2 yesterday. Prescribed TID PO at home. Only had two doses yesterday.  -Replete with Kdur TID - AM BMP   CKD Stage III On admission Cr 1.27 on admission and today 0.9 with baseline ~1.0. Patient received 2L bolus in ED.  -Avoid nephrotoxic meds - AM BMP  A FIB  Sick Sinus Syndrome  HR 95-98.   Home medications include  Amiodarone 200 daily and Edoxaban for anticoagulation. Patient has pacemaker and had [employer] since 2012. -Continue home dose of amiodarone -Continued edoxaban  Seizure Disorder Home medications include Phenobarbital 64.8 mg twice daily. Missed 1-2 days of antiepiletics as she was not feeling well. Phenobarbital level was at therapuetic level at admission.  -Continue phenobarb 64.8mg  BID  GERD Home medications include Ompeprazole 20mg  daily. - Hold omeprazole - Protonix 40 mg daily    FEN/GI: Renal carb modified diet, replete electrolytes as needed  PPx: Edoxaban   Disposition: Pending medical clearance and PT/OT evaluation   Subjective:  with no significant overnight events.    Objective: Temp:  [98 F (36.7 C)-99.6 F (37.6 C)] 98 F (36.7 C) (11/25 0753) Pulse Rate:  [78-86] 83 (11/25 0753) Resp:  [16-19] 17 (11/25 0753) BP: (102-109)/(66-84) 106/74 (11/25 0753) SpO2:  [95 %-98 %] 98 % (11/25 0753) Weight:  [100.5 kg] 100.5 kg (11/25 0348)   Physical Exam:  GEN: pleasant elderly female, in no acute distress  CV: irregularly irregular rhythm  and normal rate, no murmurs appreciated  RESP: no increased work of breathing, clear to ascultation bilaterally  ABD: Obese abdomen, bowel sounds present. soft, nontender, nondistended.  MSK: Lower extremity edema R > L SKIN: right tibial superficial necrotic tissue with granulation tissue, b/l erythema and warmth grossly unchanged, left foot with  plantar callus  NEURO: grossly normal, moves all extremities appropriately PSYCH: Normal affect and thought content    Laboratory: Recent Labs  Lab 11/08/19 1349 11/08/19 1410 11/09/19 0220 11/10/19 0322  WBC 15.2*  --  11.7* 8.3  HGB 13.8 14.3 13.1 12.1  HCT 42.1 42.0 40.1 37.0  PLT 97*  --  83* 76*   Recent Labs  Lab 11/08/19 1349 11/08/19 1410 11/09/19 0220 11/10/19 0322  NA 139 140 140 139  K 3.1* 3.1* 3.2* 3.2*  CL 107  --  107 106  CO2 21*  --  24 23  BUN 13  --  13 15  CREATININE 1.27*  --  0.96 0.90  CALCIUM 9.0  --  8.8* 8.5*  PROT 6.6  --   --   --   BILITOT 2.0*  --   --   --   ALKPHOS 106  --   --   --   ALT 12  --   --   --   AST 26  --   --   --   GLUCOSE 144*  --  105* 107*      Imaging/Diagnostic Tests: No results found.   Lyndee Hensen, MD 11/10/2019, 8:32 AM    PGY-1, [place] Intern pager: [phone], text pages welcome

## 2019-11-09 NOTE — Progress Notes (Signed)
Pt refusing to wear cpap for the night. Rt will continue to monitor as needed.

## 2019-11-09 NOTE — Plan of Care (Signed)

## 2019-11-10 LAB — BASIC METABOLIC PANEL
Anion gap: 10 (ref 5–15)
BUN: 15 mg/dL (ref 8–23)
CO2: 23 mmol/L (ref 22–32)
Calcium: 8.5 mg/dL — ABNORMAL LOW (ref 8.9–10.3)
Chloride: 106 mmol/L (ref 98–111)
Creatinine, Ser: 0.9 mg/dL (ref 0.44–1.00)
GFR calc Af Amer: >60 mL/min (ref >60)
GFR calc non Af Amer: >60 mL/min (ref >60)
Glucose, Bld: 107 mg/dL — ABNORMAL HIGH (ref 70–99)
Potassium: 3.2 mmol/L — ABNORMAL LOW (ref 3.5–5.1)
Sodium: 139 mmol/L (ref 135–145)

## 2019-11-10 LAB — CBC WITH DIFFERENTIAL/PLATELET
Abs Immature Granulocytes: 0.03 10*3/uL (ref 0.00–0.07)
Basophils Absolute: 0 10*3/uL (ref 0.0–0.1)
Basophils Relative: 0 %
Eosinophils Absolute: 0.1 10*3/uL (ref 0.0–0.5)
Eosinophils Relative: 1 %
HCT: 37 % (ref 36.0–46.0)
Hemoglobin: 12.1 g/dL (ref 12.0–15.0)
Immature Granulocytes: 0 %
Lymphocytes Relative: 10 %
Lymphs Abs: 0.8 10*3/uL (ref 0.7–4.0)
MCH: 30.3 pg (ref 26.0–34.0)
MCHC: 32.7 g/dL (ref 30.0–36.0)
MCV: 92.5 fL (ref 80.0–100.0)
Monocytes Absolute: 0.7 10*3/uL (ref 0.1–1.0)
Monocytes Relative: 8 %
Neutro Abs: 6.7 10*3/uL (ref 1.7–7.7)
Neutrophils Relative %: 81 %
Platelets: 76 10*3/uL — ABNORMAL LOW (ref 150–400)
RBC: 4 MIL/uL (ref 3.87–5.11)
RDW: 16.7 % — ABNORMAL HIGH (ref 11.5–15.5)
WBC: 8.3 10*3/uL (ref 4.0–10.5)
nRBC: 0 % (ref 0.0–0.2)

## 2019-11-10 LAB — URINE CULTURE: Culture: 40000 — AB

## 2019-11-10 LAB — MAGNESIUM: Magnesium: 1.8 mg/dL (ref 1.7–2.4)

## 2019-11-10 MED ORDER — CEFDINIR 300 MG PO CAPS
300.0000 mg | ORAL_CAPSULE | Freq: Two times a day (BID) | ORAL | Status: DC
  Administered 2019-11-10 – 2019-11-12 (×5): 300 mg via ORAL
  Filled 2019-11-10 (×6): qty 1

## 2019-11-10 MED ORDER — GUAIFENESIN 100 MG/5ML PO SOLN
5.0000 mL | ORAL | Status: DC | PRN
  Administered 2019-11-10 – 2019-11-12 (×3): 100 mg via ORAL
  Filled 2019-11-10 (×3): qty 5

## 2019-11-10 MED ORDER — SODIUM CHLORIDE 0.9 % IV SOLN
2.0000 g | Freq: Once | INTRAVENOUS | Status: AC
  Administered 2019-11-10: 2 g via INTRAVENOUS
  Filled 2019-11-10: qty 4

## 2019-11-10 NOTE — Evaluation (Signed)
Occupational Therapy Evaluation Patient Details Name: Misty Gray MRN: [id number] DOB: Apr 30, 1951 Today's Date: 11/10/2019    History of Present Illness Pt is a 68 y.o. female admitted 11/08/19 with fever, fatigue and fall. Worked up for sepsis secondary to CAP, worsening BLE cellulitis. Tested (-) for COVID-19. PMH includes afib, pacemaker, OSA (2L baseline [place]), HF, CKD III, BLE cellulitis, seizure disorder.   Clinical Impression   PT admitted with cellulitis and sepsis PNA. Pt currently with functional limitiations due to the deficits listed below (see OT problem list). Pt currently supervision for toilet transfer and noted to have 2 out 4 DOE with task. Pt needs Mod (A) for return to supine due to edema of BIL LE.  Pt will benefit from skilled OT to increase their independence and safety with adls and balance to allow discharge [place].     Follow Up Recommendations  Home health OT    Equipment Recommendations  3 in 1 bedside commode;Other (comment)(RW)    Recommendations for Other Services       Precautions / Restrictions Precautions Precautions: Fall Restrictions Weight Bearing Restrictions: No      Mobility Bed Mobility Overal bed mobility: Needs Assistance Bed Mobility: Sit to Supine       Sit to supine: Mod assist   General bed mobility comments: requires (A) to lift legs onto bed surface.   Transfers Overall transfer level: Needs assistance Equipment used: Rolling walker (2 wheeled) Transfers: Sit to/from Stand Sit to Stand: Supervision         General transfer comment: Able to stand from recliner and BSC (over toilet) to RW with supervision, heavy reliance on BUE support to push into standing    Balance Overall balance assessment: Needs assistance   Sitting balance-Leahy Scale: Fair       Standing balance-Leahy Scale: Fair Standing balance comment: requires external support of one arm                           ADL either performed or  assessed with clinical judgement   ADL Overall ADL's : Needs assistance/impaired Eating/Feeding: Modified independent   Grooming: Futures trader: Supervision/safety;RW;BSC;Grab bars   Toileting- Clothing Manipulation and Hygiene: Supervision/safety;Sit to/from stand       Functional mobility during ADLs: Supervision/safety;Rolling walker       Vision Baseline Vision/History: Wears glasses Wears Glasses: At all times       Perception     Praxis      Pertinent Vitals/Pain Pain Assessment: Faces Faces Pain Scale: Hurts a little bit Pain Location: R lateral thigh from fall Pain Descriptors / Indicators: Sore Pain Intervention(s): Monitored during session;Repositioned     Hand Dominance Right   Extremity/Trunk Assessment Upper Extremity Assessment Upper Extremity Assessment: Generalized weakness   Lower Extremity Assessment Lower Extremity Assessment: Generalized weakness   Cervical / Trunk Assessment Cervical / Trunk Assessment: Kyphotic   Communication Communication Communication: No difficulties   Cognition Arousal/Alertness: Awake/alert Behavior During Therapy: WFL for tasks assessed/performed Overall Cognitive Status: Impaired/Different from baseline Area of Impairment: Awareness;Memory;Attention;Safety/judgement                   Current Attention Level: Selective Memory: Decreased short-term memory   Safety/Judgement: Decreased awareness of deficits Awareness: Emergent   General Comments: pt very talkive all stories about her family   General Comments  pitting edema noted in bil feet. R foot 4 pitting edema and L 2 pitting    Exercises     Shoulder Instructions      Home Living Family/patient expects to be discharged to:: Private residence Living Arrangements: Spouse/significant other;Children;Other relatives Available Help at Discharge: Family;Available 24 hours/day Type of Home: House Home  Access: Stairs to enter [employer] of Steps: 3 in front, 6 on back porch Entrance Stairs-Rails: Right Home Layout: One level     Bathroom Shower/Tub: Tub only   Firefighter: Standard Bathroom Accessibility: No   Home Equipment: Cane - single point;Shower seat   Additional Comments: Lives in rental home, unsure if ramp can be installed. Mentions home infested with crickets, roaches, slugs... currently working to get these issues sorted out. Has RW that is too tall      Prior Functioning/Environment Level of Independence: Needs assistance  Gait / Transfers Assistance Needed: Mod indep ambulation with SPC ADL's / Homemaking Assistance Needed: Reports granddaughter has assisted with bathing lately as pt has difficulty getting into tub. Husband does majority of cooking and housekeeping   Comments: Not interested in w/c for community ambulation, reports "I'll walk as long as my legs can carry me"        OT Problem List: Decreased strength;Decreased activity tolerance;Impaired balance (sitting and/or standing);Decreased safety awareness;Decreased knowledge of use of DME or AE;Decreased knowledge of precautions;Obesity;Pain;Cardiopulmonary status limiting activity      OT Treatment/Interventions: Self-care/ADL training;Therapeutic exercise;Energy conservation;DME and/or AE instruction;Manual therapy;Therapeutic activities;Patient/family education;Balance training    OT Goals(Current goals can be found in the care plan section) Acute Rehab OT Goals Patient Stated Goal: to continue to write my poems about god OT Goal Formulation: With patient Time For Goal Achievement: 11/24/19 Potential to Achieve Goals: Good  OT Frequency: Min 2X/week   Barriers to D/C:            Co-evaluation              AM-PAC OT "6 Clicks" Daily Activity     Outcome Measure Help from another person eating meals?: A Little Help from another person taking care of personal grooming?: A  Little Help from another person toileting, which includes using toliet, bedpan, or urinal?: A Little Help from another person bathing (including washing, rinsing, drying)?: A Lot Help from another person to put on and taking off regular upper body clothing?: A Little Help from another person to put on and taking off regular lower body clothing?: A Lot 6 Click Score: 16   End of Session Equipment Utilized During Treatment: Rolling walker Nurse Communication: Mobility status;Precautions  Activity Tolerance: Patient tolerated treatment well Patient left: in chair;with call bell/phone within reach  OT Visit Diagnosis: Unsteadiness on feet (R26.81);Pain                Time: 3734-2876 OT Time Calculation (min): 34 min Charges:  OT General Charges $OT Visit: 1 Visit OT Evaluation $OT Eval Moderate Complexity: 1 Mod OT Treatments $Self Care/Home Management : 8-22 mins   Brynn, OTR/L  Acute Rehabilitation Services Pager: [phone] Office: [phone] .   Mateo Flow 11/10/2019, 3:45 PM

## 2019-11-10 NOTE — Progress Notes (Signed)
Patient still refusing CPAP, will continue to monitor.

## 2019-11-10 NOTE — TOC Initial Note (Addendum)
Transition of Care [facility]) - Initial/Assessment Note    Patient Details  Name: Misty Gray MRN: [id number] Date of Birth: 14-Feb-1951  Transition of Care [facility]) CM/SW Contact:    Gildardo Griffes, [place] Phone Number: [phone] 11/10/2019, 3:19 PM  Clinical Narrative:                  Update: [facility] able to accept patient for PT and OT.   CSW spoke with patient regarding discharge planning, she reports she is agreeable to home health PT and OT, and states she was active with [facility] in the past. Patient reports she needs a rolling walker and a larger 3in1 as she has one at home that she cannot fit in anymore. Reports her son Fayrene Fearing will pick her up at time of discharge.   CSW has reached out to [facility] Lup[facility] Leash, she reports patient was active with them in 2018, she is sending her referral for approval. Pending approval at this time.   [facility] will need to be contacted for delivery of rolling walker and 3in1 at time of discharge one DME orders are in.   Will need DME orders for 3in1 and rolling walker, as well as Home Health orders for PT and OT at time of discharge.   Expected Discharge Plan: Home w Home Health Services Barriers to Discharge: Continued Medical Work up   Patient Goals and CMS Choice Patient states their goals for this hospitalization and ongoing recovery are:: to go home CMS Medicare.gov Compare Post Acute Care list provided to:: Patient Choice offered to / list presented to : Patient  Expected Discharge Plan and Services Expected Discharge Plan: Home w Home Health Services     Post Acute Care Choice: Home Health Living arrangements for the past 2 months: Single Family Home                 DME Arranged: 3-N-1, Walker rolling DME Agency: [facility] Date DME Agency Contacted: 11/10/19 Time DME Agency Contacted: [phone] Representative spoke with at DME Agency: [facility] HH Arranged: OT, PT HH Agency: [facility]  ([facility]) Date HH Agency Contacted: 11/10/19 Time HH Agency Contacted: [id number] Representative spoke with at [facility] Agency: Lupita Leash  Prior Living Arrangements/Services Living arrangements for the past 2 months: Single Family Home Lives with:: Adult Children Patient language and need for interpreter reviewed:: Yes Do you feel safe going back to the place where you live?: Yes      Need for Family Participation in Patient Care: Yes (Comment) Care giver support system in place?: Yes (comment)   Criminal Activity/Legal Involvement Pertinent to Current Situation/Hospitalization: No - Comment as needed  Activities of Daily Living Home Assistive Devices/Equipment: Cane (specify quad or straight), Walker (specify type), Bedside commode/3-in-1, Scales, Blood pressure cuff ADL Screening (condition at time of admission) Patient's cognitive ability adequate to safely complete daily activities?: Yes Is the patient deaf or have difficulty hearing?: No Does the patient have difficulty seeing, even when wearing glasses/contacts?: No Does the patient have difficulty concentrating, remembering, or making decisions?: No Patient able to express need for assistance with ADLs?: No Does the patient have difficulty dressing or bathing?: No Independently performs ADLs?: Yes (appropriate for developmental age) Does the patient have difficulty walking or climbing stairs?: Yes Weakness of Legs: Both Weakness of Arms/Hands: None  Permission Sought/Granted Permission sought to share information with : Case Manager, Magazine features editor, Family Supports Permission granted to share information with : Yes, Verbal Permission  Granted  Share Information with NAME: Jeneen Rinks  Permission granted to share info w AGENCY: [place] granted to share info w Relationship: son  Permission granted to share info w Contact Information: [phone]  Emotional Assessment Appearance:: Appears stated  age Attitude/Demeanor/Rapport: Gracious Affect (typically observed): Calm Orientation: : Oriented to Self, Oriented to Place, Oriented to  Time, Oriented to Situation Alcohol / Substance Use: Not Applicable Psych Involvement: No (comment)  Admission diagnosis:  code sepsis Patient Active Problem List   Diagnosis Date Noted  . Sepsis due to pneumonia ([place]) 11/08/2019  . Abdominal wall hernia 11/05/2017  . Anticoagulated 11/05/2017  . Rash and nonspecific skin eruption 09/15/2017  . Ventral hernia without obstruction or gangrene 08/15/2017  . Skin irritation 07/23/2017  . Chronic venous stasis dermatitis 12/26/2016  . Urinary tract infection without hematuria   . Frequent PVCs   . Hypokalemia   . Housing problems 11/23/2016  . Chronic atrial fibrillation ([place]) 11/01/2016  . Hypotension 10/31/2016  . Special screening for malignant neoplasms, colon 08/12/2016  . Heme positive stool 08/09/2016  . Intertrigo 08/09/2016  . Chronic anticoagulation 04/26/2016  . Chronic venous insufficiency   . Preventative health care 10/05/2014  . Breast mass, left 10/05/2014  . Osteopenia 09/15/2014  . Hypertension 01/04/2014  . Chronic systolic dysfunction of left ventricle 05/05/2013  . At high risk for falls 02/11/2013  . Vitamin D deficiency 07/15/2012  . Sinoatrial node dysfunction ([place]) 05/08/2011  . Pacemaker 08/10/2009  . Allergic rhinitis 05/31/2008  . Morbid obesity ([place]) 02/12/2007  . GASTROESOPHAGEAL REFLUX, NO ESOPHAGITIS 02/12/2007  . Female stress incontinence 02/12/2007  . Seizure disorder ([place]) 02/12/2007  . OSA (obstructive sleep apnea) 02/12/2007   PCP:  [name], MD Pharmacy:   [place], [place] - [address] [place] [address] Phone: [phone] Fax: [phone]     Social Determinants of Health (SDOH) Interventions    Readmission Risk Interventions No flowsheet data found.

## 2019-11-10 NOTE — Evaluation (Signed)
Physical Therapy Evaluation Patient Details Name: Misty Gray MRN: [id number] DOB: 30-Jun-1951 Today's Date: 11/10/2019   History of Present Illness  Pt is a 68 y.o. female admitted 11/08/19 with fever, fatigue and fall. Worked up for sepsis secondary to CAP, worsening BLE cellulitis. Tested (-) for COVID-19. PMH includes afib, pacemaker, OSA (2L baseline [place]), HF, CKD III, BLE cellulitis, seizure disorder.    Clinical Impression  Pt presents with an overall decrease in functional mobility secondary to above. PTA, pt mod indep ambulating with SPC, family assists with ADL/IADLs as needed; pt reports rental home infested with roaches, other bugs, reached out to SW for resources regarding this. Today, pt ambulatory with RW and intermittent min guard for balance. Reports no recent falls besides the one out of bed leading to admission. Pt receptive to educ on fall risk reduction strategies. Not interested in manual w/c for community distances. Pt would benefit from continued acute PT services to maximize functional mobility and independence prior to d/c with HHPT services.     Follow Up Recommendations Home health PT;Supervision for mobility/OOB    Equipment Recommendations  Rolling walker with 5" wheels;3in1 (PT)    Recommendations for Other Services       Precautions / Restrictions Precautions Precautions: Fall Restrictions Weight Bearing Restrictions: No      Mobility  Bed Mobility               General bed mobility comments: Received sitting in recliner  Transfers Overall transfer level: Needs assistance Equipment used: Rolling walker (2 wheeled) Transfers: Sit to/from Stand Sit to Stand: Supervision         General transfer comment: Able to stand from recliner and BSC (over toilet) to RW with supervision, heavy reliance on BUE support to push into standing  Ambulation/Gait Ambulation/Gait assistance: Min guard Gait Distance (Feet): 40 Feet Assistive device:  Rolling walker (2 wheeled) Gait Pattern/deviations: Step-through pattern;Decreased stride length;Wide base of support Gait velocity: Decreased Gait velocity interpretation: <1.8 ft/sec, indicate of risk for recurrent falls General Gait Details: Slow gait to/from bathroom with RW and min guard for balance, no physical assist required. Pt declining further mobility secondary to soreness  Stairs            Wheelchair Mobility    Modified Rankin (Stroke Patients Only)       Balance Overall balance assessment: Needs assistance   Sitting balance-Leahy Scale: Fair       Standing balance-Leahy Scale: Fair Standing balance comment: Can static stand and take steps without UE support                             Pertinent Vitals/Pain Pain Assessment: Faces Faces Pain Scale: Hurts a little bit Pain Location: R lateral thigh from fall Pain Descriptors / Indicators: Sore Pain Intervention(s): Monitored during session    Home Living Family/patient expects to be discharged to:: Private residence Living Arrangements: Spouse/significant other;Children;Other relatives(husband, daughter, granddaughter) Available Help at Discharge: Family;Available 24 hours/day Type of Home: House Home Access: Stairs to enter Entrance Stairs-Rails: Right Entrance Stairs-Number of Steps: 3 in front, 6 on back porch Home Layout: One level Home Equipment: Kasandra Knudsen - single point Additional Comments: Lives in rental home, unsure if ramp can be installed. Mentions home infested with crickets, roaches, slugs... currently working to get these issues sorted out. Has RW that is too tall    Prior Function Level of Independence: Needs assistance   Gait /  Transfers Assistance Needed: Mod indep ambulation with SPC  ADL's / Homemaking Assistance Needed: Reports granddaughter has assisted with bathing lately as pt has difficulty getting into tub. Husband does majority of cooking and housekeeping  Comments:  Not interested in w/c for community ambulation, reports "I'll walk as long as my legs can carry me"     Hand Dominance        Extremity/Trunk Assessment   Upper Extremity Assessment Upper Extremity Assessment: Generalized weakness    Lower Extremity Assessment Lower Extremity Assessment: Generalized weakness(BLE crusty, red, edema)    Cervical / Trunk Assessment Cervical / Trunk Assessment: Kyphotic  Communication   Communication: No difficulties  Cognition Arousal/Alertness: Awake/alert Behavior During Therapy: WFL for tasks assessed/performed Overall Cognitive Status: Impaired/Different from baseline Area of Impairment: Awareness;Memory;Attention;Safety/judgement                   Current Attention Level: Selective Memory: Decreased short-term memory   Safety/Judgement: Decreased awareness of deficits Awareness: Emergent   General Comments: Pleasant and agreeable. Cognition not formally assessed - noted some decreased attention and short-term memory deficits, pt repetitive but able to be redirected; seems to have poor insight into current condition and issues with unsafe home environment      General Comments General comments (skin integrity, edema, etc.): DOE 2/4 with ambulation, SpO2 >92% on RA    Exercises     Assessment/Plan    PT Assessment Patient needs continued PT services  PT Problem List Decreased strength;Decreased activity tolerance;Decreased balance;Decreased mobility;Decreased knowledge of use of DME;Cardiopulmonary status limiting activity;Pain;Decreased skin integrity;Obesity       PT Treatment Interventions DME instruction;Gait training;Stair training;Functional mobility training;Therapeutic activities;Therapeutic exercise;Balance training;Patient/family education    PT Goals (Current goals can be found in the Care Plan section)  Acute Rehab PT Goals Patient Stated Goal: Get back home and "keep walking as long as my legs can carry me" PT  Goal Formulation: With patient Time For Goal Achievement: 11/24/19 Potential to Achieve Goals: Good    Frequency Min 3X/week   Barriers to discharge Inaccessible home environment      Co-evaluation               AM-PAC PT "6 Clicks" Mobility  Outcome Measure Help needed turning from your back to your side while in a flat bed without using bedrails?: A Little Help needed moving from lying on your back to sitting on the side of a flat bed without using bedrails?: A Little Help needed moving to and from a bed to a chair (including a wheelchair)?: A Little Help needed standing up from a chair using your arms (e.g., wheelchair or bedside chair)?: A Little Help needed to walk in hospital room?: A Little Help needed climbing 3-5 steps with a railing? : A Little 6 Click Score: 18    End of Session   Activity Tolerance: Patient tolerated treatment well;Patient limited by fatigue Patient left: in chair;with call bell/phone within reach Nurse Communication: Mobility status PT Visit Diagnosis: Other abnormalities of gait and mobility (R26.89);Muscle weakness (generalized) (M62.81)    Time: 9678-9381 PT Time Calculation (min) (ACUTE ONLY): 23 min   Charges:   PT Evaluation $PT Eval Moderate Complexity: 1 Mod PT Treatments $Therapeutic Activity: 8-22 mins      Ina Homes, PT, DPT Acute Rehabilitation Services  Pager [phone] Office [phone]  Malachy Chamber 11/10/2019, 3:03 PM

## 2019-11-10 NOTE — Discharge Summary (Addendum)
Family Medicine Teaching [facility] Discharge Summary  [name] Medical record number: [id number] Date of birth: 1951/03/10 Age: 68 y.o. Gender: female Date of Admission: 11/08/2019  Date of Discharge: 11/13/19 Admitting Physician: Inez Catalina, MD  Primary Care Provider: Latrelle Dodrill, MD Consultants: Cardiology  Indication for Hospitalization: Sepsis secondary to CAP, cellulitis  Discharge Diagnoses/Problem List:  Right lower extremity cellulitis Community-acquired pneumonia Atrial fibrillation Sick sinus syndrome Contact dermatitis HFrEF Hypertension CKD stage III Hypokalemia GERD Seizure disorder   Disposition: Home  Discharge Condition: Stable and improving  Discharge Exam:   GEN: pleasant elderly female, in no acute distress  CV: irregularly irregular rate and rhythm, no murmurs appreciated  RESP: no increased work of breathing, clear to ascultation bilaterally with no crackles, wheezes, or rhonchi  ABD: Bowel sounds present. Soft, Nontender, Non-distended, obese abdomen MSK: Bilateral lower extremity edema  R> L, SKIN: See images below NEURO: grossly normal, moves all extremities appropriately PSYCH: Normal affect and thought content        Brief Hospital Course:   Antibiotics 11/08/2019: Vancomycin, ceftriaxone, Flagyl 11/09/2019: Discontinued vancomycin and Flagyl, started azithromycin and cefdinir 11/11/2019: Started doxycycline, discontinued with azithromycin  Community-acquired pneumonia  Sepsis Misty Gray is a 68 y.o. female admitted for sepsis due to community-acquired pneumonia, treated with IV antibiotics and was transitioned to oral therapy. Chest x-ray indicated left lower lobe pneumonia with small left pleural effusion.  Blood cultures show no growth at 5 days.  Urine culture grew yeast coagulase-negative staph.  Patient denied dysuria.  Urinalysis was unremarkable.  Patient remained afebrile for > 72  hours.  On the day of discharge, patient satting well on room air with clear lungs on the exam. Patient to complete 10-day course blood cefdinir and doxycycline outpatient (End date 11/17/19).   Cellulitis Patient with history of chronic venous stasis dermatitis presented with right lower extremity cellulitis with associated lower extremity edema, erythema and warmth. Treated with IV antibiotics and transitioned to oral therapy (see abx course above).  Right lower extremity venous duplex was unremarkable.  She is to complete outpatient doxycycline and cefdinir on 11/17/2019.  Contact dermatitis Patient describes itching and burning in her right thigh.  Raised erythematous patches on exam (see images above).  Symptoms resolved with Benadryl cream and Claritin.  Prescribed topical steroid cream at discharge. Patient has close follow-up with PCP.    Atrial fibrillation Patient remained rate controlled throughout admission.  There was concern the patient's pacemaker was not capturing the patient's heart rate.  Cardiology was consulted and pacemaker settings were adjusted.  She is to follow-up with her cardiologist, Dr. Johney Frame outpatient.  Issues for Follow Up:  1. Ensure patient has and is taking doxycycline and cefdinir.  Therapy course to end on 11/17/2019. 2. Patient prescribed Kenalog cream. Reassess patient's right thigh for improvement of contact dermatitis.   3. Recommend repeat CBC on hospital follow-up.  Consider outpatient referral to hematology if thrombocytopenia persists. 4. Patient is to follow-up with her cardiologist reguarding her pacemaker settings.  Please remind patient of this recommendation.  Significant Procedures: None  Significant Labs and Imaging:  Recent Labs  Lab 11/10/19 0322 11/11/19 0722 11/12/19 0249  WBC 8.3 6.1 6.3  HGB 12.1 12.3 13.3  HCT 37.0 38.4 41.6  PLT 76* 86* 87*   Recent Labs  Lab 11/08/19 1349 11/08/19 1410 11/09/19 0220 11/10/19 0322  11/11/19 0722 11/12/19 0249  NA 139 140 140 139 139 140  K 3.1* 3.1* 3.2*  3.2* 3.2* 4.0  CL 107  --  107 106 103 104  CO2 21*  --  24 23 27 26   GLUCOSE 144*  --  105* 107* 102* 98  BUN 13  --  13 15 13 11   CREATININE 1.27*  --  0.96 0.90 0.91 0.75  CALCIUM 9.0  --  8.8* 8.5* 8.8* 9.2  MG  --   --  1.9 1.8  --   --   ALKPHOS 106  --   --   --   --   --   AST 26  --   --   --   --   --   ALT 12  --   --   --   --   --   ALBUMIN 3.0*  --   --   --   --   --     Dg Chest Port 1 View  Result Date: 11/08/2019 CLINICAL DATA:  Sepsis. EXAM: PORTABLE CHEST 1 VIEW COMPARISON:  November 28, 2016 FINDINGS: There is consolidation in the left lower lobe with small left pleural effusion. Lungs elsewhere are clear. There is cardiomegaly with pulmonary vascularity normal. Pacemaker leads attached to right atrium and right ventricle. No adenopathy. No bone lesions. IMPRESSION: Left lower lobe consolidation consistent with pneumonia with small left pleural effusion. Lungs elsewhere clear. There is cardiomegaly. Pacemaker leads attached right atrium and right ventricle. No adenopathy. Electronically Signed   By: Lowella Grip III M.D.   On: 11/08/2019 14:02   Vas [place] Lower Extremity Venous (dvt)  Result Date: 11/13/2019  Lower Venous Study Indications: Edema, and Erythema.  Limitations: Body habitus and poor ultrasound/tissue interface. Comparison Study: No prior study.   Summary: Right: There is no evidence of deep vein thrombosis in the lower extremity. However, portions of this examination were limited- see technologist comments above. No cystic structure found in the popliteal fossa. Pulsatile venous flow is suggestive of possible elevated right heart pressure.  *See table(s) above for measurements and observations. Electronically signed by Curt Jews MD on 11/13/2019 at 9:24:25 AM.    Final         Results/Tests Pending at Time of Discharge:   Discharge Medications:  Allergies as of  11/12/2019      Reactions   Aspirin Hives, Rash   Penicillins Rash, Other (See Comments)   Has patient had a PCN reaction causing immediate rash, facial/tongue/throat swelling, SOB or lightheadedness with hypotension: Yes Has patient had a PCN reaction causing severe rash involving mucus membranes or skin necrosis: No Has patient had a PCN reaction that required hospitalization No Has patient had a PCN reaction occurring within the last 10 years: Yes If all of the above answers are "NO", then may proceed with Cephalosporin use. Patient has tolerated cephalosporins in 2017.   Tape Other (See Comments)   NO PAPER TAPE-MUST BE MEDICAL TAPE - unknown reaction NO PAPER TAPE-MUST BE MEDICAL TAPE - unknown reaction   Wool Alcohol [lanolin] Hives   Amoxicillin Rash   Patient has tolerated cephalosporins   Ampicillin Itching, Rash   Patient has tolerated cephalosporins      Medication List    TAKE these medications   amiodarone 200 MG tablet Commonly known as: PACERONE TAKE 1 TABLET BY MOUTH ONCE DAILY.   cefdinir 300 MG capsule Commonly known as: OMNICEF Take 1 capsule (300 mg total) by mouth every 12 (twelve) hours for 5 days.   cholecalciferol 1000 units tablet Commonly known as: VITAMIN  D Take 1 tablet (1,000 Units total) by mouth daily.   doxycycline 100 MG tablet Commonly known as: VIBRA-TABS Take 1 tablet (100 mg total) by mouth every 12 (twelve) hours for 5 days.   fluticasone 50 MCG/ACT nasal spray Commonly known as: FLONASE Place 2 sprays into both nostrils daily as needed. For congestion.   omeprazole 20 MG capsule Commonly known as: PRILOSEC TAKE (1) CAPSULE BY MOUTH ONCE DAILY. What changed: See the new instructions.   PHENobarbital 32.4 MG tablet Commonly known as: LUMINAL TAKE (2) TABLETS BY MOUTH TWICE DAILY. (MORNING AND BEDTIME) What changed: See the new instructions.   potassium chloride 10 MEQ tablet Commonly known as: KLOR-CON TAKE 4 TABLETS BY MOUTH  THREE TIMES DAILY (MORNING, NOON, AND NIGHT) What changed: See the new instructions.   Savaysa 60 MG Tabs tablet Generic drug: edoxaban TAKE 1 TABLET BY MOUTH ONCE DAILY. What changed: how much to take   torsemide 20 MG tablet Commonly known as: DEMADEX TAKE (2) TABLETS BY MOUTH TWICE DAILY. What changed: See the new instructions.   triamcinolone cream 0.5 % Commonly known as: KENALOG Apply topically 3 (three) times daily.       Discharge Instructions: Please refer to Patient Instructions section of EMR for full details.  Patient was counseled important signs and symptoms that should prompt return to medical care, changes in medications, dietary instructions, activity restrictions, and follow up appointments.   Follow-Up Appointments:   Katha Cabal, MD 11/14/2019, 1:46 PM PGY-1, [facility] Family Medicine ------------------------------------------------------------------------------------------- Upper Level Addendum: I have evaluated this patient along with Dr. Rachael Darby and reviewed the above note, making necessary revisions in blue.  Myrene Buddy MD PGY-3 Family Medicine Resident

## 2019-11-11 DIAGNOSIS — L03115 Cellulitis of right lower limb: Secondary | ICD-10-CM

## 2019-11-11 LAB — CBC
HCT: 38.4 % (ref 36.0–46.0)
Hemoglobin: 12.3 g/dL (ref 12.0–15.0)
MCH: 29.6 pg (ref 26.0–34.0)
MCHC: 32 g/dL (ref 30.0–36.0)
MCV: 92.5 fL (ref 80.0–100.0)
Platelets: 86 10*3/uL — ABNORMAL LOW (ref 150–400)
RBC: 4.15 MIL/uL (ref 3.87–5.11)
RDW: 16.6 % — ABNORMAL HIGH (ref 11.5–15.5)
WBC: 6.1 10*3/uL (ref 4.0–10.5)
nRBC: 0 % (ref 0.0–0.2)

## 2019-11-11 LAB — BASIC METABOLIC PANEL
Anion gap: 9 (ref 5–15)
BUN: 13 mg/dL (ref 8–23)
CO2: 27 mmol/L (ref 22–32)
Calcium: 8.8 mg/dL — ABNORMAL LOW (ref 8.9–10.3)
Chloride: 103 mmol/L (ref 98–111)
Creatinine, Ser: 0.91 mg/dL (ref 0.44–1.00)
GFR calc Af Amer: >60 mL/min (ref >60)
GFR calc non Af Amer: >60 mL/min (ref >60)
Glucose, Bld: 102 mg/dL — ABNORMAL HIGH (ref 70–99)
Potassium: 3.2 mmol/L — ABNORMAL LOW (ref 3.5–5.1)
Sodium: 139 mmol/L (ref 135–145)

## 2019-11-11 MED ORDER — TORSEMIDE 20 MG PO TABS
40.0000 mg | ORAL_TABLET | Freq: Two times a day (BID) | ORAL | Status: DC
  Administered 2019-11-11 – 2019-11-12 (×3): 40 mg via ORAL
  Filled 2019-11-11 (×3): qty 2

## 2019-11-11 MED ORDER — POTASSIUM CHLORIDE CRYS ER 20 MEQ PO TBCR
40.0000 meq | EXTENDED_RELEASE_TABLET | Freq: Once | ORAL | Status: AC
  Administered 2019-11-11: 40 meq via ORAL
  Filled 2019-11-11: qty 2

## 2019-11-11 MED ORDER — LORATADINE 10 MG PO TABS
10.0000 mg | ORAL_TABLET | Freq: Every day | ORAL | Status: DC
  Administered 2019-11-11 – 2019-11-12 (×2): 10 mg via ORAL
  Filled 2019-11-11 (×2): qty 1

## 2019-11-11 MED ORDER — LORATADINE 10 MG PO TABS: 10.0000 mg | ORAL_TABLET | Freq: Every day | ORAL | Status: DC

## 2019-11-11 MED ORDER — NYSTATIN 100000 UNIT/GM EX POWD
Freq: Two times a day (BID) | CUTANEOUS | Status: DC
  Administered 2019-11-11 – 2019-11-12 (×3): via TOPICAL
  Filled 2019-11-11: qty 15

## 2019-11-11 MED ORDER — DOXYCYCLINE HYCLATE 100 MG PO TABS
100.0000 mg | ORAL_TABLET | Freq: Two times a day (BID) | ORAL | Status: DC
  Administered 2019-11-11 – 2019-11-12 (×3): 100 mg via ORAL
  Filled 2019-11-11 (×3): qty 1

## 2019-11-11 NOTE — Progress Notes (Signed)
Patient refusing CPAP, no distress noted, will continue to monitor.

## 2019-11-11 NOTE — Plan of Care (Signed)

## 2019-11-11 NOTE — TOC Progression Note (Deleted)
Transition of Care [facility]) - Progression Note    Patient Details  Name: Misty Gray MRN: [id number] Date of Birth: 09-May-1951  Transition of Care [facility]) CM/SW [place], [place] Phone Number: 11/11/2019, 9:44 AM  Clinical Narrative:     Patient not medically ready today. MD is going to keep her for observation of her cellulitis.   CSW informed Gerald Stabs with [facility] ([place] CSW will continue to follow and assist with TOC needs.   Expected Discharge Plan: [place] Barriers to Discharge: Continued Medical Work up  Expected Discharge Plan and Services Expected Discharge Plan: [place] Choice: [place] arrangements for the past 2 months: Single Family Home                 DME Arranged: 3-N-1, Walker rolling DME Agency: [employer] Date DME Agency Contacted: 11/10/19 Time DME Agency Contacted: [phone] Representative spoke with at DME Agency: [place]: OT, PT [place] Agency: [place] ([place]) Date Experiment: 11/10/19 Time [place]: [place] Representative spoke with at [place]: [place] ([place]) Interventions    Readmission Risk Interventions No flowsheet data found.

## 2019-11-11 NOTE — Progress Notes (Signed)
Family Medicine Teaching Service Daily Progress Note Intern Pager: [phone]  [name] Medical record number: [id number] Date of birth: [birth year] Age: 68 y.o. Gender: female  Primary Care Provider: [name], MD Consultants: None Code Status: DNI  Pt Overview and Major Events to Date:  11/08/19: Admitted, CTX, Vancomycin, and Flagyl  11/09/19: Discontined Vanc and Flagyl. Start Azithromycin.  11/11/19: Start doxy, stop azithromycin  Assessment and Plan: Misty Gray is a 68 y.o. female  presenting with fever and fatigue with a mechanical fall. PMH is significant for A. Fib,  pacemaker in place, Sleep apnea on 2L baseline [place], HFrEF, cellulitis of lower extremities, C. Diff infection (2016), and Sick sinus syndrome with pacemaker.  Sepsis 2/2 Community Acquired Pneumonia Patient continues to be afebrile and vital signs stable.  Satting well on room air.  Uses 2 L nasal cannula at bedtime.  Refuses CPAP at night. Blood cultures no growth x 2 days. Urine culture with 40k cfu yeast, 10k cfu CONS species, no treatment in setting of no symptoms.  Leukocytosis has normalized ( WBC 11.7 to 8.3). Continue with oral CAP antibiotic treatment for 7 total days (end 11/30), discontinue azithromycin for doxycycline (see below).  - Continue cefdinir (11/25-) - Disontinue Azithromycin (11/24 - 11/25) - Start doxycycline (11/26-) - PT / OT recommending home PT/OT  Cellulitis Erythema spreading proximally to upper thigh. See pictures. No pustulant discharge, pt remains afebrile. Patient with history of chronic venous stasis dermatitis.  - Start treatment with doxycycline for MRSA coverage  HFrEF Last Echo January 2020 showing EF ~25%.  BNP on admission > 4000 and patient received 2 L IV fluid for circulatory support. Home medications include torsemide 40mg  PO BID. UOP yesterday 3L. Weight 11/25 100.5kg, down 1kg to 99.5kg today. Continue to encourage patient to keep LE  elevated when at rest.    -Continue torsemide 40mg  PO BID - Daily Weights - Strict Is/Os  HTN Blood pressure 106/74 today with BP ranges 102/66 - 124/78 yesterday.  Home medications include Amiodarone 200mg  and Torsemide 40mg  BID. -Monitor BP  -Continue home meds  Hypokalemia Yesterday K 3.2. Prescribed 32mEq TID PO at home. Awaiting K level today. -Replete with Kdur 15meq TID - AM BMP   CKD Stage III On admission Cr 1.27 on admission and today 0.9 with baseline ~1.0. Patient received 2L bolus in ED.  -Avoid nephrotoxic meds - AM BMP  A FIB  Sick Sinus Syndrome  HR 95-98.   Home medications include  Amiodarone 200 daily and Edoxaban for anticoagulation. Patient has pacemaker and had [place] since 2012. -Continue home dose of amiodarone -Continued edoxaban  Seizure Disorder Home medications include Phenobarbital 64.8 mg twice daily. Missed 1-2 days of antiepiletics as she was not feeling well. Phenobarbital level was at therapuetic level at admission.  -Continue phenobarb 64.8mg  BID  GERD Home medications include Ompeprazole 20mg  daily. - Hold omeprazole - Protonix 40 mg daily   FEN/GI: Renal carb modified diet, replete electrolytes as needed  PPx: Edoxaban   Disposition: Stable for discharge.  Subjective:  Misty Gray with no significant overnight events. Patient is comfortable, wishes for lasix to be given during the day instead of at night for fewer night time awakenings for urination. Cellulitis worsening in RLE.  Objective: Temp:  [97.9 F (36.6 C)-98.4 F (36.9 C)] 98.3 F (36.8 C) (11/25 2329) Pulse Rate:  [81-93] 84 (11/25 2329) Resp:  [13-28] 28 (11/25 2329) BP: (100-122)/(69-90) 122/77 (11/25 2329) SpO2:  [  98 %-100 %] 99 % (11/25 2329)   Physical Exam:  GEN: pleasant elderly female, in no acute distress  CV: irregularly irregular rhythm and normal rate, no murmurs appreciated  RESP: no increased work of breathing, clear to ascultation  bilaterally  ABD: Obese abdomen, bowel sounds present. soft, nontender, nondistended.  MSK: Lower extremity edema R > L SKIN: right tibial superficial necrotic tissue with granulation tissue, b/l erythema and warmth, erythema spreading to upper thigh on RLE, left foot with plantar callus  NEURO: grossly normal, moves all extremities appropriately PSYCH: Normal affect and thought content      Laboratory: Recent Labs  Lab 11/08/19 1349 11/08/19 1410 11/09/19 0220 11/10/19 0322  WBC 15.2*  --  11.7* 8.3  HGB 13.8 14.3 13.1 12.1  HCT 42.1 42.0 40.1 37.0  PLT 97*  --  83* 76*   Recent Labs  Lab 11/08/19 1349 11/08/19 1410 11/09/19 0220 11/10/19 0322  NA 139 140 140 139  K 3.1* 3.1* 3.2* 3.2*  CL 107  --  107 106  CO2 21*  --  24 23  BUN 13  --  13 15  CREATININE 1.27*  --  0.96 0.90  CALCIUM 9.0  --  8.8* 8.5*  PROT 6.6  --   --   --   BILITOT 2.0*  --   --   --   ALKPHOS 106  --   --   --   ALT 12  --   --   --   AST 26  --   --   --   GLUCOSE 144*  --  105* 107*      Imaging/Diagnostic Tests: No results found.   Misty Mylar, MD 11/11/2019, 4:05 AM    PGY-1, [facility] Family Medicine FPTS Intern pager: [phone], text pages welcome

## 2019-11-12 ENCOUNTER — Inpatient Hospital Stay (HOSPITAL_COMMUNITY): Payer: Medicare Other

## 2019-11-12 DIAGNOSIS — D696 Thrombocytopenia, unspecified: Secondary | ICD-10-CM

## 2019-11-12 DIAGNOSIS — L538 Other specified erythematous conditions: Secondary | ICD-10-CM

## 2019-11-12 LAB — CBC
HCT: 41.6 % (ref 36.0–46.0)
Hemoglobin: 13.3 g/dL (ref 12.0–15.0)
MCH: 30 pg (ref 26.0–34.0)
MCHC: 32 g/dL (ref 30.0–36.0)
MCV: 93.9 fL (ref 80.0–100.0)
Platelets: 87 10*3/uL — ABNORMAL LOW (ref 150–400)
RBC: 4.43 MIL/uL (ref 3.87–5.11)
RDW: 16.5 % — ABNORMAL HIGH (ref 11.5–15.5)
WBC: 6.3 10*3/uL (ref 4.0–10.5)
nRBC: 0 % (ref 0.0–0.2)

## 2019-11-12 LAB — BASIC METABOLIC PANEL
Anion gap: 10 (ref 5–15)
BUN: 11 mg/dL (ref 8–23)
CO2: 26 mmol/L (ref 22–32)
Calcium: 9.2 mg/dL (ref 8.9–10.3)
Chloride: 104 mmol/L (ref 98–111)
Creatinine, Ser: 0.75 mg/dL (ref 0.44–1.00)
GFR calc Af Amer: >60 mL/min (ref >60)
GFR calc non Af Amer: >60 mL/min (ref >60)
Glucose, Bld: 98 mg/dL (ref 70–99)
Potassium: 4 mmol/L (ref 3.5–5.1)
Sodium: 140 mmol/L (ref 135–145)

## 2019-11-12 MED ORDER — CEFDINIR 300 MG PO CAPS: 300.0000 mg | ORAL_CAPSULE | Freq: Two times a day (BID) | ORAL | 0 refills | Status: AC

## 2019-11-12 MED ORDER — TRIAMCINOLONE ACETONIDE 0.5 % EX CREA
TOPICAL_CREAM | Freq: Three times a day (TID) | CUTANEOUS | Status: DC
  Filled 2019-11-12: qty 15

## 2019-11-12 MED ORDER — TRIAMCINOLONE ACETONIDE 0.5 % EX CREA: TOPICAL_CREAM | Freq: Three times a day (TID) | CUTANEOUS | 0 refills | Status: DC

## 2019-11-12 MED ORDER — DIPHENHYDRAMINE-ZINC ACETATE 2-0.1 % EX CREA
TOPICAL_CREAM | Freq: Two times a day (BID) | CUTANEOUS | Status: DC | PRN
  Administered 2019-11-12: 05:00:00 via TOPICAL
  Filled 2019-11-12: qty 28

## 2019-11-12 MED ORDER — DOXYCYCLINE HYCLATE 100 MG PO TABS: 100.0000 mg | ORAL_TABLET | Freq: Two times a day (BID) | ORAL | 0 refills | Status: AC

## 2019-11-12 NOTE — Progress Notes (Addendum)
Paged Family medicine about FYI; pacemaker not capturing. HR 60s-70s, underlying rhythm a fib. Patient is not in any current distress.  Family medicine called back. Awaiting orders. No orders noted. Will continue to observe.   0404- c/o itching to her right hip. Redness continue to be noted. Swelling also noted. MD is aware of the redness and swelling. Will continue to oberve. Paged MD about patient's c/o itching and her saying that she felt relief from the claritin she took yesterday.   74- Family medicine call back. New order noted.

## 2019-11-12 NOTE — Discharge Instructions (Addendum)
You were hospitalized at [facility] due to shortness of breath and rash on right leg.  We expect this is from   pneumonia and cellulitis on right leg which improved after antibiotics   We are so glad you are feeling better.  Be sure to follow-up with your regularly scheduled PCP and cardiology appointments.  Please also be sure to follow-up with our clinic/PCP on 11/16/19 at 8: 50 AM.  Thank you for allowing [place] to take care of you.  - Go to the ED if you develop high fever, worsening redness, chest pain or worsening shortness of breath.  - Be sure to follow up at your PCP office - Stop by the pharmacy to pick up your medications.   Take care, [facility] family medicine team

## 2019-11-12 NOTE — Progress Notes (Signed)
Interim progress note:  Cardiology was contacted this morning regarding concern that patient's pacemaker was not capturing with heart rate 60-70s and underlying rhythm and A. fib.  I received a call from Michie who interrogated the device.  She stated the patient is in A. fib, but because it was such a fine pattern the pacemaker may not have been picking up on the rhythm.  Ms. Dierdre Highman made some slight changes to the patient's pacemaker settings to hopefully better capture the A. fib.  She will get in touch with patient's cardiologist, Dr. Rayann Heman (who is currently scrubbed in on a case), to notify him of the changes.  Ms. Jabier Mutton was informed that either she or Dr. Rayann Heman are welcome to reach out to me on my personal cell phone to further discuss the pacemaker, setting changes, further management, etc. as needed.  Also feel free to page at [phone].  Milus Banister, [place], PGY-2 11/12/2019 9:53 AM

## 2019-11-12 NOTE — TOC Transition Note (Signed)
Transition of Care [facility]) - CM/SW Discharge Note   Patient Details  Name: LATICIA VANNOSTRAND MRN: [id number] Date of Birth: May 07, 1951  [facility]) CM/SW Contact:  Alberteen Sam, [place] Phone Number: [phone] 11/12/2019, 2:40 PM   Clinical Narrative:     Patient is set up with [facility] for PT and OT (pending orders from MD). CSW has reached out to [employer] for delivery of 3in1 and Rolling Walker to the room prior to patient's discharge. Patient's son Jeneen Rinks will pick up this afternoon. No further needs identified at this time.   Final next level of care: [place] Barriers to Discharge: No Barriers Identified   Patient Goals and CMS Choice Patient states their goals for this hospitalization and ongoing recovery are:: to go home CMS Medicare.gov Compare Post Acute Care list provided to:: Patient Choice offered to / list presented to : Patient  Discharge Placement                       Discharge Plan and Services     Post Acute Care Choice: Home Health          DME Arranged: Walker rolling, 3-N-1 DME Agency: [employer] Date DME Agency Contacted: 11/12/19 Time DME Agency Contacted: [phone] Representative spoke with at DME Agency: Gregary Cromer HH Arranged: OT, PT [place] Agency: [place] ([place]) Date [place]: 11/10/19 Time [place]: [place] Representative spoke with at [place]: [place] ([place]) Interventions     Readmission Risk Interventions No flowsheet data found.

## 2019-11-12 NOTE — Progress Notes (Signed)
   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 Recevied request for PPM interrogation - have spoken with [employer] rep Lorella Nimrod who will interrogate device and contact requesting provider Dr. Ouida Sills with result. If any further cardiology input needed after that conversation/interrogation, please call us back. Evanna Washinton PA-C

## 2019-11-12 NOTE — Plan of Care (Signed)

## 2019-11-12 NOTE — Progress Notes (Signed)
Patient given discharge instructions with son here for transport. IV was removed and intact. Patients questions were answered regarding follow up appointments and medications. Patient and son understood all discharge instructions.

## 2019-11-12 NOTE — Progress Notes (Signed)
Right lower extremity venous duplex completed. Refer to "CV Proc" under chart review to view preliminary results.  11/12/2019 2:10 PM Kelby Aline., MHA, RVT, RDCS, RDMS

## 2019-11-12 NOTE — Progress Notes (Addendum)
Family Medicine Teaching Service Daily Progress Note Intern Pager: [phone]  [name] Medical record number: [id number] Date of birth: 11-27-1951 Age: 68 y.o. Gender: female  Primary Care Provider: [name], MD Consultants: None Code Status: DNI  Pt Overview and Major Events to Date:  11/08/19: Admitted, CTX, Vancomycin, and Flagyl  11/09/19: Discontined Vanc and Flagyl. Start Azithromycin.  11/11/19: Start doxy, stop azithromycin  Assessment and Plan: LUJEAN Gray is a 68 y.o. female  presenting with fever and fatigue with a mechanical fall. PMH is significant for A. Fib,  pacemaker in place, Sleep apnea on 2L baseline [place], HFrEF, cellulitis of lower extremities, C. Diff infection (2016), and Sick sinus syndrome with pacemaker.  Sepsis 2/2 Community Acquired Pneumonia Patient remains afebrile and vital signs are stable, satting well on room air (97-99%).  Patient continues to refuse CPAP at night.  Blood cultures show no growth at 3 days.  Leukocytosis has resolved.  Lungs are clear on exam.  Continue with CAP treatment for 7 days (end date 11/30).  - Continue cefdinir (11/25-) -Continue doxycycline (11/26-) - PT / OT recommending home PT/OT  Cellulitis Patient complaining of right leg itching was given Benadryl and Claritin overnight likely contact dermatitis.  Itching has resolved this morning. Raised erythematous patches upper thigh. No extension of cellulitic area.   However, erythema and edema remain (see images below).  There is no purulent drainage.  Patient with history of chronic venous stasis dermatitis.  Will obtain venous Doppler of the right lower extremity rule out DVT/phlebitis. -Continue doxycycline (10 day course)    HFrEF Last Echo January 2020 showing EF ~25%.  BNP on admission > 4000 and patient received 2 L IV fluid for circulatory support. Home medications include torsemide 40mg  PO BID. patient diuresed 2.6 L yesterday with 80 mg  oral torsemide given.  Patient down 0.5 kg since yesterday, weight continues to downtrend since admission.  Net loss of ~4L since admission.  Continue to encourage patient to keep bilateral lower extremities elevated.  -Continue torsemide 40mg  PO BID - Daily Weights - Strict Is/Os -Consider compression wraps at discharge  HTN Blood pressure normotensive today with BP ranges 102/63-115/78 yesterday.  Home medications include Amiodarone 200mg  and Torsemide 40mg  BID. -Monitor BP  -Continue home meds  Hypokalemia Potassium 4.0 from 3.2 yesterday.  Home medications include 29mEq TID PO.  -Replete with Kdur 53meq TID - AM BMP   CKD Stage III On admission Cr 1.27 on admission and today 0.75 with baseline ~1.0. Patient received 2L bolus in ED.  -Avoid nephrotoxic meds - AM BMP  A FIB  Sick Sinus Syndrome  Heart rate was rate controlled yesterday with range of 69-92.  Home medications include  Amiodarone 200 daily and Edoxaban for anticoagulation. Patient has pacemaker and had [place] since 2012.  Cardiology will interrogate patient's pacemaker device today. -Continue home dose of amiodarone -Continued edoxaban -Follow-up cardiology recommendations regarding pacemaker  Seizure Disorder Home medications include Phenobarbital 64.8 mg twice daily.  No recent seizures.  Phenobarbital level was at therapuetic level at admission.  -Continue phenobarb 64.8mg  BID  GERD Home medications include Ompeprazole 20mg  daily. - Hold omeprazole - Protonix 40 mg daily   FEN/GI: Renal carb modified diet, replete electrolytes as needed  PPx: Edoxaban   Disposition: likely home today   Subjective:  Misty Gray had itching and burning of right lower extremity.   Objective: Temp:  [97.6 F (36.4 C)-98.5 F (36.9 C)] 98.2 F (  36.8 C) (11/27 0700) Pulse Rate:  [69-107] 107 (11/27 0700) Resp:  [15-20] 18 (11/27 0700) BP: (102-115)/(73-87) 112/87 (11/27 0700) SpO2:  [97 %-100 %] 100 %  (11/27 0700) Weight:  [100 kg] 100 kg (11/27 0316)   Physical Exam: GEN: pleasant elderly female, in no acute distress  CV: irregularly irregular rate and rhythm, no murmurs appreciated  RESP: no increased work of breathing, clear to ascultation bilaterally with no crackles, wheezes, or rhonchi  ABD: Bowel sounds present. Soft, Nontender, Non-distended, obese abdomen MSK: Bilateral lower extremity edema  R> L, SKIN: See images below NEURO: grossly normal, moves all extremities appropriately PSYCH: Normal affect and thought content       Laboratory: Recent Labs  Lab 11/10/19 0322 11/11/19 0722 11/12/19 0249  WBC 8.3 6.1 6.3  HGB 12.1 12.3 13.3  HCT 37.0 38.4 41.6  PLT 76* 86* 87*   Recent Labs  Lab 11/08/19 1349  11/10/19 0322 11/11/19 0722 11/12/19 0249  NA 139   < > 139 139 140  K 3.1*   < > 3.2* 3.2* 4.0  CL 107   < > 106 103 104  CO2 21*   < > 23 27 26   BUN 13   < > 15 13 11   CREATININE 1.27*   < > 0.90 0.91 0.75  CALCIUM 9.0   < > 8.5* 8.8* 9.2  PROT 6.6  --   --   --   --   BILITOT 2.0*  --   --   --   --   ALKPHOS 106  --   --   --   --   ALT 12  --   --   --   --   AST 26  --   --   --   --   GLUCOSE 144*   < > 107* 102* 98   < > = values in this interval not displayed.      Imaging/Diagnostic Tests: No results found.   , MD 11/12/2019, 9:44 AM    PGY-1, [facility] Family Medicine FPTS Intern pager: [phone], text pages welcome

## 2019-11-12 NOTE — Progress Notes (Signed)
Physical Therapy Treatment Patient Details Name: Misty Gray MRN: [id number] DOB: [birth year] Today's Date: 11/12/2019    History of Present Illness Pt is a 68 y.o. female admitted 11/08/19 with fever, fatigue and fall. Worked up for sepsis secondary to CAP, worsening BLE cellulitis. Tested (-) for COVID-19. PMH includes afib, pacemaker, OSA (2L baseline [place]), HF, CKD III, BLE cellulitis, seizure disorder.    PT Comments    Pt tolerated treatment well, ambulating for increased distances and with decreased assistance requirements. Pt participates in HEP with good form, requiring intermittent rests due to fatigue. Pt declines stair training at this time, reporting her son is able to assist and her husband as well. Pt will benefit from continued acute PT services to continue reducing falls risk and aide in a return to independent mobility with use of RW.   Follow Up Recommendations  Home health PT;Supervision for mobility/OOB     Equipment Recommendations  Rolling walker with 5" wheels;3in1 (PT)    Recommendations for Other Services       Precautions / Restrictions Precautions Precautions: Fall Restrictions Weight Bearing Restrictions: No    Mobility  Bed Mobility Overal bed mobility: (pt received and left sitting in recliner)                Transfers Overall transfer level: Needs assistance Equipment used: Rolling walker (2 wheeled) Transfers: Sit to/from Stand Sit to Stand: Supervision            Ambulation/Gait Ambulation/Gait assistance: Supervision Gait Distance (Feet): 100 Feet Assistive device: Rolling walker (2 wheeled) Gait Pattern/deviations: Step-through pattern;Wide base of support;Trunk flexed Gait velocity: Decreased Gait velocity interpretation: <1.8 ft/sec, indicate of risk for recurrent falls General Gait Details: pt with shortened stride length and reduced gait speed, requiring one brief standing rest due to fatigue   Stairs              Wheelchair Mobility    Modified Rankin (Stroke Patients Only)       Balance Overall balance assessment: Needs assistance Sitting-balance support: No upper extremity supported;Feet supported Sitting balance-Leahy Scale: Good Sitting balance - Comments: supervision   Standing balance support: Single extremity supported;During functional activity Standing balance-Leahy Scale: Good Standing balance comment: supervision                            Cognition Arousal/Alertness: Awake/alert Behavior During Therapy: WFL for tasks assessed/performed Overall Cognitive Status: Within Functional Limits for tasks assessed                                        Exercises General Exercises - Lower Extremity Ankle Circles/Pumps: AROM;Both;20 reps Gluteal Sets: AROM;Both;15 reps Long Arc Quad: AROM;Both;20 reps    General Comments General comments (skin integrity, edema, etc.): VSS, pitting edema in BLE, 2 bilaterally. Pt on room air throughout      Pertinent Vitals/Pain Pain Assessment: Faces Faces Pain Scale: Hurts a little bit Pain Location: R lateral thigh from fall Pain Descriptors / Indicators: Sore Pain Intervention(s): Limited activity within patient's tolerance    Home Living                      Prior Function            PT Goals (current goals can now be found in the care plan section) Acute Rehab  PT Goals Patient Stated Goal: To go home Progress towards PT goals: Progressing toward goals    Frequency    Min 3X/week      PT Plan Current plan remains appropriate    Co-evaluation              AM-PAC PT "6 Clicks" Mobility   Outcome Measure  Help needed turning from your back to your side while in a flat bed without using bedrails?: A Little Help needed moving from lying on your back to sitting on the side of a flat bed without using bedrails?: A Little Help needed moving to and from a bed to a chair (including  a wheelchair)?: None Help needed standing up from a chair using your arms (e.g., wheelchair or bedside chair)?: None Help needed to walk in hospital room?: None Help needed climbing 3-5 steps with a railing? : A Little 6 Click Score: 21    End of Session Equipment Utilized During Treatment: (none) Activity Tolerance: Patient tolerated treatment well Patient left: in chair;with call bell/phone within reach Nurse Communication: Mobility status PT Visit Diagnosis: Other abnormalities of gait and mobility (R26.89);Muscle weakness (generalized) (M62.81)     Time: 6734-1937 PT Time Calculation (min) (ACUTE ONLY): 23 min  Charges:  $Gait Training: 8-22 mins $Therapeutic Exercise: 8-22 mins                     Arlyss Gandy, PT, DPT Acute Rehabilitation Pager: [phone]    Arlyss Gandy 11/12/2019, 12:04 PM

## 2019-11-13 LAB — CULTURE, BLOOD (ROUTINE X 2)
Culture: NO GROWTH
Culture: NO GROWTH
Special Requests: ADEQUATE

## 2019-11-15 ENCOUNTER — Other Ambulatory Visit: Payer: Self-pay | Admitting: *Deleted

## 2019-11-15 DIAGNOSIS — J189 Pneumonia, unspecified organism: Secondary | ICD-10-CM | POA: Diagnosis not present

## 2019-11-15 DIAGNOSIS — I482 Chronic atrial fibrillation, unspecified: Secondary | ICD-10-CM | POA: Diagnosis not present

## 2019-11-15 DIAGNOSIS — I5022 Chronic systolic (congestive) heart failure: Secondary | ICD-10-CM | POA: Diagnosis not present

## 2019-11-15 DIAGNOSIS — G4733 Obstructive sleep apnea (adult) (pediatric): Secondary | ICD-10-CM | POA: Diagnosis not present

## 2019-11-15 DIAGNOSIS — I495 Sick sinus syndrome: Secondary | ICD-10-CM | POA: Diagnosis not present

## 2019-11-15 DIAGNOSIS — I872 Venous insufficiency (chronic) (peripheral): Secondary | ICD-10-CM | POA: Diagnosis not present

## 2019-11-15 DIAGNOSIS — L03116 Cellulitis of left lower limb: Secondary | ICD-10-CM | POA: Diagnosis not present

## 2019-11-15 DIAGNOSIS — K219 Gastro-esophageal reflux disease without esophagitis: Secondary | ICD-10-CM | POA: Diagnosis not present

## 2019-11-15 DIAGNOSIS — L03115 Cellulitis of right lower limb: Secondary | ICD-10-CM | POA: Diagnosis not present

## 2019-11-15 DIAGNOSIS — I130 Hypertensive heart and chronic kidney disease with heart failure and stage 1 through stage 4 chronic kidney disease, or unspecified chronic kidney disease: Secondary | ICD-10-CM | POA: Diagnosis not present

## 2019-11-15 DIAGNOSIS — N1832 Chronic kidney disease, stage 3b: Secondary | ICD-10-CM | POA: Diagnosis not present

## 2019-11-15 DIAGNOSIS — E559 Vitamin D deficiency, unspecified: Secondary | ICD-10-CM | POA: Diagnosis not present

## 2019-11-15 NOTE — Patient Outreach (Signed)
[place] [facility]) Care Management  11/15/2019  SALEAH RISHEL [birth year] [id number]   EMMI-General Discharge RED ON EMMI ALERT Day #1 Date:11/14/2019 Red Alert Reason: Know who to call about changes in condition   Outreach #1  RN attempted outreach call however unsuccessful. RN able to leave a HIPAA approved voice message and will send outreach letter accordingly.  PLAN: Will schedule another follow up call within the next week.  Raina Mina, RN Care Management Coordinator [place] Office [phone]

## 2019-11-16 ENCOUNTER — Other Ambulatory Visit: Payer: Self-pay | Admitting: *Deleted

## 2019-11-16 ENCOUNTER — Inpatient Hospital Stay: Payer: Medicare Other

## 2019-11-16 DIAGNOSIS — R0902 Hypoxemia: Secondary | ICD-10-CM | POA: Diagnosis not present

## 2019-11-16 DIAGNOSIS — G4733 Obstructive sleep apnea (adult) (pediatric): Secondary | ICD-10-CM | POA: Diagnosis not present

## 2019-11-16 NOTE — Patient Outreach (Signed)
[place] [facility]) Care Management  11/16/2019  GIBSON TELLERIA 1951/05/31 [id number]    EMMI-General Discharge RED ON EMMI ALERT Day #1 Date:11/14/2019 Red Alert Reason: Know who to call about changes in condition   Outreach #2  RN attempted outreach call however unsuccessful. RN able to leave a HIPAA approved voice message and will send outreach letter accordingly.  PLAN: Will schedule another follow up call within the next week.  Raina Mina, RN Care Management Coordinator [place] Office [phone]

## 2019-11-17 ENCOUNTER — Ambulatory Visit (INDEPENDENT_AMBULATORY_CARE_PROVIDER_SITE_OTHER): Payer: Medicare Other | Admitting: Student in an Organized Health Care Education/Training Program

## 2019-11-17 ENCOUNTER — Other Ambulatory Visit: Payer: Self-pay

## 2019-11-17 VITALS — BP 101/62 | HR 58 | Wt 217.6 lb

## 2019-11-17 DIAGNOSIS — B351 Tinea unguium: Secondary | ICD-10-CM | POA: Diagnosis not present

## 2019-11-17 DIAGNOSIS — Z09 Encounter for follow-up examination after completed treatment for conditions other than malignant neoplasm: Secondary | ICD-10-CM | POA: Diagnosis not present

## 2019-11-17 NOTE — Patient Instructions (Addendum)
It was a pleasure to see you today!  To summarize our discussion for this visit:  Please continue to take your antibiotics until they are complete.  Please make an appointment to follow up with your cardiologist soon.  Your cardiologist office number is [phone]  I will send in a referral to a podiatrist (foot doctor) to help with your toenail pain  Some additional health maintenance measures we should update are: Health Maintenance Due  Topic Date Due  . COLONOSCOPY  03/17/2015  . COLON CANCER SCREENING ANNUAL FOBT  04/16/2019  . TETANUS/TDAP  04/19/2019  . INFLUENZA VACCINE  07/17/2019  .    Call the clinic at [phone] if your symptoms worsen or you have any concerns.   Thank you for allowing me to take part in your care,  Dr. Doristine Mango

## 2019-11-17 NOTE — Progress Notes (Signed)
   Subjective:    Patient ID: Misty Gray, female    DOB: 1951-09-18, 68 y.o.   MRN: [id number]  CC: Hospital follow-up  HPI:  Pneumonia/sepsis-patient states that she still has 6 pills left each of cefdinir and doxycycline.  She has seen much improvement in the redness and pain of the infection on her leg.  She continues to use her topical steroid 3 times per day.  She has noticed some peeling of dry skin. Additionally, patient states that she is having increased pain in her right foot second toenail which is currently into her skin.  This is her biggest concern at this time.  Her husband normally trims her toenails for her but has not been able to for some time.  A. fib-patient has not made appointment to see her cardiologist yet.  Denies any symptoms.  She states that she does not know how to get in contact with her heart doctor.  Anticoagulation-patient has multiple ecchymoses from venipuncture during hospital stay.  She states that they have been getting better.  Smoking status reviewed   ROS: pertinent noted in the HPI   I have personally reviewed pertinent past medical history, surgical, family, and social history as appropriate. Objective:  BP 101/62   Pulse (!) 58   Wt 217 lb 9.6 oz (98.7 kg)   SpO2 98%   BMI 37.35 kg/m   Vitals and nursing note reviewed  General: NAD, pleasant, able to participate in exam Respiratory: CTAB, normal effort, No wheezes, rales or rhonchi Extremities: Please see clinical images for further detail.. Skin: please see clinical images for further detail.  Rash on leg is nonblanchable on thigh, nontender to palpation, no increased warmth, no induration Neuro: alert, no obvious focal deficits Psych: Normal affect and mood       Assessment & Plan:   Hospital discharge follow-up Patient continues with antibiotic treatment and topical steroid treatment as prescribed. Obtain CBC, CMP as per hospital discharge summary Provided patient with  her cardiologist office phone number and instructed her to make an appointment  Onychomycosis Severe disease. Referred patient to podiatry  Orders Placed This Encounter  Procedures  . CBC with Differential/Platelet  . Basic Metabolic Panel  . Ambulatory referral to Podiatry    Referral Priority:   Routine    Referral Type:   Consultation    Referral Reason:   Specialty Services Required    Requested Specialty:   Podiatry    Number of Visits Requested:   [place], [place] Medicine PGY-2

## 2019-11-17 NOTE — Assessment & Plan Note (Signed)
Patient continues with antibiotic treatment and topical steroid treatment as prescribed. Obtain CBC, CMP as per hospital discharge summary Provided patient with her cardiologist office phone number and instructed her to make an appointment

## 2019-11-17 NOTE — Assessment & Plan Note (Signed)
Severe disease. Referred patient to podiatry

## 2019-11-18 ENCOUNTER — Telehealth: Payer: Self-pay | Admitting: *Deleted

## 2019-11-18 DIAGNOSIS — E559 Vitamin D deficiency, unspecified: Secondary | ICD-10-CM | POA: Diagnosis not present

## 2019-11-18 DIAGNOSIS — L03115 Cellulitis of right lower limb: Secondary | ICD-10-CM | POA: Diagnosis not present

## 2019-11-18 DIAGNOSIS — I872 Venous insufficiency (chronic) (peripheral): Secondary | ICD-10-CM | POA: Diagnosis not present

## 2019-11-18 DIAGNOSIS — I482 Chronic atrial fibrillation, unspecified: Secondary | ICD-10-CM | POA: Diagnosis not present

## 2019-11-18 DIAGNOSIS — I130 Hypertensive heart and chronic kidney disease with heart failure and stage 1 through stage 4 chronic kidney disease, or unspecified chronic kidney disease: Secondary | ICD-10-CM | POA: Diagnosis not present

## 2019-11-18 DIAGNOSIS — K219 Gastro-esophageal reflux disease without esophagitis: Secondary | ICD-10-CM | POA: Diagnosis not present

## 2019-11-18 DIAGNOSIS — N1832 Chronic kidney disease, stage 3b: Secondary | ICD-10-CM | POA: Diagnosis not present

## 2019-11-18 DIAGNOSIS — G4733 Obstructive sleep apnea (adult) (pediatric): Secondary | ICD-10-CM | POA: Diagnosis not present

## 2019-11-18 DIAGNOSIS — I5022 Chronic systolic (congestive) heart failure: Secondary | ICD-10-CM | POA: Diagnosis not present

## 2019-11-18 DIAGNOSIS — L03116 Cellulitis of left lower limb: Secondary | ICD-10-CM | POA: Diagnosis not present

## 2019-11-18 DIAGNOSIS — I495 Sick sinus syndrome: Secondary | ICD-10-CM | POA: Diagnosis not present

## 2019-11-18 DIAGNOSIS — J189 Pneumonia, unspecified organism: Secondary | ICD-10-CM | POA: Diagnosis not present

## 2019-11-18 LAB — BASIC METABOLIC PANEL WITH GFR
BUN/Creatinine Ratio: 12 (ref 12–28)
BUN: 13 mg/dL (ref 8–27)
CO2: 25 mmol/L (ref 20–29)
Calcium: 10 mg/dL (ref 8.7–10.3)
Chloride: 97 mmol/L (ref 96–106)
Creatinine, Ser: 1.07 mg/dL — ABNORMAL HIGH (ref 0.57–1.00)
GFR calc Af Amer: 61 mL/min/{1.73_m2}
GFR calc non Af Amer: 53 mL/min/{1.73_m2} — ABNORMAL LOW
Glucose: 123 mg/dL — ABNORMAL HIGH (ref 65–99)
Potassium: 5 mmol/L (ref 3.5–5.2)
Sodium: 136 mmol/L (ref 134–144)

## 2019-11-18 LAB — CBC WITH DIFFERENTIAL/PLATELET
Basophils Absolute: 0.1 10*3/uL (ref 0.0–0.2)
Basos: 1 %
EOS (ABSOLUTE): 0.1 10*3/uL (ref 0.0–0.4)
Eos: 1 %
Hematocrit: 41.7 % (ref 34.0–46.6)
Hemoglobin: 13.5 g/dL (ref 11.1–15.9)
Immature Grans (Abs): 0 10*3/uL (ref 0.0–0.1)
Immature Granulocytes: 0 %
Lymphocytes Absolute: 0.9 10*3/uL (ref 0.7–3.1)
Lymphs: 13 %
MCH: 29.9 pg (ref 26.6–33.0)
MCHC: 32.4 g/dL (ref 31.5–35.7)
MCV: 92 fL (ref 79–97)
Monocytes Absolute: 0.6 10*3/uL (ref 0.1–0.9)
Monocytes: 8 %
Neutrophils Absolute: 5.8 10*3/uL (ref 1.4–7.0)
Neutrophils: 77 %
Platelets: 199 10*3/uL (ref 150–450)
RBC: 4.52 x10E6/uL (ref 3.77–5.28)
RDW: 14.4 % (ref 11.7–15.4)
WBC: 7.5 10*3/uL (ref 3.4–10.8)

## 2019-11-18 NOTE — Telephone Encounter (Signed)
Brandon from [facility] calling for OT verbal orders as follows:  1 time(s) weekly for 1 week(s), then 2 time(s) weekly for 2 week(s), then 1 time(s) weekly for 1 week(s), then 2 time(s) weekly for 1 week(s).  You can leave verbal orders on confidential voicemail.  Christen Bame, CMA

## 2019-11-18 NOTE — Telephone Encounter (Signed)
Informed Erlene Quan of verbal orders per Dr. Ardelia Mems.  Ozella Almond, [place]

## 2019-11-18 NOTE — Telephone Encounter (Signed)
Please authorize orders Brittany J McIntyre, MD  

## 2019-11-19 ENCOUNTER — Ambulatory Visit: Payer: Medicare Other | Admitting: *Deleted

## 2019-11-23 ENCOUNTER — Other Ambulatory Visit: Payer: Self-pay | Admitting: *Deleted

## 2019-11-23 NOTE — Patient Outreach (Signed)
[place] [facility]) Care Management  11/23/2019  Misty Gray 02-Mar-1951 [id number]    EMMI-General Discharge-RESOLVED RED ON EMMI ALERT Day # 1 Date:11/14/2019 Red Alert Reason: Know who to call about changes in condition.  Outreach #3  RN spoke with pt and verified the above EMMI has been resolved. Pt reports she has followed up with her providers and aware who to call if she has any problems and encounters any issues.   Plan: Will close this case with no additional needs to address at this time.  Raina Mina, RN Care Management Coordinator [place] Office [phone]

## 2019-11-24 DIAGNOSIS — N1832 Chronic kidney disease, stage 3b: Secondary | ICD-10-CM | POA: Diagnosis not present

## 2019-11-24 DIAGNOSIS — K219 Gastro-esophageal reflux disease without esophagitis: Secondary | ICD-10-CM | POA: Diagnosis not present

## 2019-11-24 DIAGNOSIS — J189 Pneumonia, unspecified organism: Secondary | ICD-10-CM | POA: Diagnosis not present

## 2019-11-24 DIAGNOSIS — I482 Chronic atrial fibrillation, unspecified: Secondary | ICD-10-CM | POA: Diagnosis not present

## 2019-11-24 DIAGNOSIS — I5022 Chronic systolic (congestive) heart failure: Secondary | ICD-10-CM | POA: Diagnosis not present

## 2019-11-24 DIAGNOSIS — I130 Hypertensive heart and chronic kidney disease with heart failure and stage 1 through stage 4 chronic kidney disease, or unspecified chronic kidney disease: Secondary | ICD-10-CM | POA: Diagnosis not present

## 2019-11-24 DIAGNOSIS — I495 Sick sinus syndrome: Secondary | ICD-10-CM | POA: Diagnosis not present

## 2019-11-24 DIAGNOSIS — E559 Vitamin D deficiency, unspecified: Secondary | ICD-10-CM | POA: Diagnosis not present

## 2019-11-24 DIAGNOSIS — G4733 Obstructive sleep apnea (adult) (pediatric): Secondary | ICD-10-CM | POA: Diagnosis not present

## 2019-11-24 DIAGNOSIS — L03116 Cellulitis of left lower limb: Secondary | ICD-10-CM | POA: Diagnosis not present

## 2019-11-24 DIAGNOSIS — L03115 Cellulitis of right lower limb: Secondary | ICD-10-CM | POA: Diagnosis not present

## 2019-11-24 DIAGNOSIS — I872 Venous insufficiency (chronic) (peripheral): Secondary | ICD-10-CM | POA: Diagnosis not present

## 2019-11-26 DIAGNOSIS — L03115 Cellulitis of right lower limb: Secondary | ICD-10-CM | POA: Diagnosis not present

## 2019-11-26 DIAGNOSIS — I130 Hypertensive heart and chronic kidney disease with heart failure and stage 1 through stage 4 chronic kidney disease, or unspecified chronic kidney disease: Secondary | ICD-10-CM | POA: Diagnosis not present

## 2019-11-26 DIAGNOSIS — E559 Vitamin D deficiency, unspecified: Secondary | ICD-10-CM | POA: Diagnosis not present

## 2019-11-26 DIAGNOSIS — J189 Pneumonia, unspecified organism: Secondary | ICD-10-CM | POA: Diagnosis not present

## 2019-11-26 DIAGNOSIS — I482 Chronic atrial fibrillation, unspecified: Secondary | ICD-10-CM | POA: Diagnosis not present

## 2019-11-26 DIAGNOSIS — I495 Sick sinus syndrome: Secondary | ICD-10-CM | POA: Diagnosis not present

## 2019-11-26 DIAGNOSIS — L03116 Cellulitis of left lower limb: Secondary | ICD-10-CM | POA: Diagnosis not present

## 2019-11-26 DIAGNOSIS — K219 Gastro-esophageal reflux disease without esophagitis: Secondary | ICD-10-CM | POA: Diagnosis not present

## 2019-11-26 DIAGNOSIS — I872 Venous insufficiency (chronic) (peripheral): Secondary | ICD-10-CM | POA: Diagnosis not present

## 2019-11-26 DIAGNOSIS — N1832 Chronic kidney disease, stage 3b: Secondary | ICD-10-CM | POA: Diagnosis not present

## 2019-11-26 DIAGNOSIS — I5022 Chronic systolic (congestive) heart failure: Secondary | ICD-10-CM | POA: Diagnosis not present

## 2019-11-26 DIAGNOSIS — G4733 Obstructive sleep apnea (adult) (pediatric): Secondary | ICD-10-CM | POA: Diagnosis not present

## 2019-11-29 ENCOUNTER — Ambulatory Visit (INDEPENDENT_AMBULATORY_CARE_PROVIDER_SITE_OTHER): Payer: Medicare Other | Admitting: Podiatry

## 2019-11-29 ENCOUNTER — Other Ambulatory Visit: Payer: Self-pay | Admitting: Podiatry

## 2019-11-29 ENCOUNTER — Encounter: Payer: Self-pay | Admitting: Podiatry

## 2019-11-29 ENCOUNTER — Ambulatory Visit (INDEPENDENT_AMBULATORY_CARE_PROVIDER_SITE_OTHER): Payer: Medicare Other

## 2019-11-29 ENCOUNTER — Other Ambulatory Visit: Payer: Self-pay

## 2019-11-29 VITALS — BP 106/67 | HR 86 | Temp 97.8°F

## 2019-11-29 DIAGNOSIS — L84 Corns and callosities: Secondary | ICD-10-CM | POA: Diagnosis not present

## 2019-11-29 DIAGNOSIS — M79672 Pain in left foot: Secondary | ICD-10-CM | POA: Diagnosis not present

## 2019-11-29 DIAGNOSIS — R600 Localized edema: Secondary | ICD-10-CM

## 2019-11-29 DIAGNOSIS — M79675 Pain in left toe(s): Secondary | ICD-10-CM | POA: Diagnosis not present

## 2019-11-29 DIAGNOSIS — M79674 Pain in right toe(s): Secondary | ICD-10-CM

## 2019-11-29 DIAGNOSIS — M79671 Pain in right foot: Secondary | ICD-10-CM | POA: Diagnosis not present

## 2019-11-29 DIAGNOSIS — B351 Tinea unguium: Secondary | ICD-10-CM | POA: Diagnosis not present

## 2019-11-29 NOTE — Progress Notes (Signed)
Subjective:   Patient ID: Misty Gray, female   DOB: 68 y.o.   MRN: [id number]   HPI Patient presents with caregiver with severe thick nailbeds 1-5 both feet that are impossible for her to cut and painful and lesion subleft that is very painful along with third digit right foot.  Patient is not in good health and does not smoke and is not active   Review of Systems  All other systems reviewed and are negative.       Objective:  Physical Exam Vitals and nursing note reviewed.  Constitutional:      Appearance: She is well-developed.  Pulmonary:     Effort: Pulmonary effort is normal.  Musculoskeletal:        General: Normal range of motion.  Skin:    General: Skin is warm.  Neurological:     Mental Status: She is alert.     Vascular status found to be mildly diminished with varicosities in the ankle and slight diminishment of [place] dull vibratory.  Severely thickened nailbeds 1-5 both feet that are dystrophic painful and hard for her to cut.  Patient also noted to have lesion subthird metatarsal left that is painful and third digit right with keratotic tissue formation and difficulty with weightbearing.  Patient has good digital perfusion     Assessment:  Severe mycotic nail infection 1-5 both feet with pain and lesion left and right that are both painful when palpated     Plan:  H&P conditions reviewed and today nail debridement accomplished along with lesion debridement and I discussed shoes with thick soles and patient will be seen back for routine care.  Patient is a to be seen back in 3 months or earlier if any issues were to occur  X-rays indicate mild osteoporosis significant arthritis with no indications of stress fracture or acute process

## 2019-11-30 DIAGNOSIS — J189 Pneumonia, unspecified organism: Secondary | ICD-10-CM | POA: Diagnosis not present

## 2019-11-30 DIAGNOSIS — K219 Gastro-esophageal reflux disease without esophagitis: Secondary | ICD-10-CM | POA: Diagnosis not present

## 2019-11-30 DIAGNOSIS — G4733 Obstructive sleep apnea (adult) (pediatric): Secondary | ICD-10-CM | POA: Diagnosis not present

## 2019-11-30 DIAGNOSIS — L03116 Cellulitis of left lower limb: Secondary | ICD-10-CM | POA: Diagnosis not present

## 2019-11-30 DIAGNOSIS — I495 Sick sinus syndrome: Secondary | ICD-10-CM | POA: Diagnosis not present

## 2019-11-30 DIAGNOSIS — I482 Chronic atrial fibrillation, unspecified: Secondary | ICD-10-CM | POA: Diagnosis not present

## 2019-11-30 DIAGNOSIS — I130 Hypertensive heart and chronic kidney disease with heart failure and stage 1 through stage 4 chronic kidney disease, or unspecified chronic kidney disease: Secondary | ICD-10-CM | POA: Diagnosis not present

## 2019-11-30 DIAGNOSIS — N1832 Chronic kidney disease, stage 3b: Secondary | ICD-10-CM | POA: Diagnosis not present

## 2019-11-30 DIAGNOSIS — I872 Venous insufficiency (chronic) (peripheral): Secondary | ICD-10-CM | POA: Diagnosis not present

## 2019-11-30 DIAGNOSIS — E559 Vitamin D deficiency, unspecified: Secondary | ICD-10-CM | POA: Diagnosis not present

## 2019-11-30 DIAGNOSIS — I5022 Chronic systolic (congestive) heart failure: Secondary | ICD-10-CM | POA: Diagnosis not present

## 2019-11-30 DIAGNOSIS — L03115 Cellulitis of right lower limb: Secondary | ICD-10-CM | POA: Diagnosis not present

## 2019-12-02 DIAGNOSIS — E559 Vitamin D deficiency, unspecified: Secondary | ICD-10-CM | POA: Diagnosis not present

## 2019-12-02 DIAGNOSIS — L03116 Cellulitis of left lower limb: Secondary | ICD-10-CM | POA: Diagnosis not present

## 2019-12-02 DIAGNOSIS — L03115 Cellulitis of right lower limb: Secondary | ICD-10-CM | POA: Diagnosis not present

## 2019-12-02 DIAGNOSIS — K219 Gastro-esophageal reflux disease without esophagitis: Secondary | ICD-10-CM | POA: Diagnosis not present

## 2019-12-02 DIAGNOSIS — G4733 Obstructive sleep apnea (adult) (pediatric): Secondary | ICD-10-CM | POA: Diagnosis not present

## 2019-12-02 DIAGNOSIS — I482 Chronic atrial fibrillation, unspecified: Secondary | ICD-10-CM | POA: Diagnosis not present

## 2019-12-02 DIAGNOSIS — J189 Pneumonia, unspecified organism: Secondary | ICD-10-CM | POA: Diagnosis not present

## 2019-12-02 DIAGNOSIS — I495 Sick sinus syndrome: Secondary | ICD-10-CM | POA: Diagnosis not present

## 2019-12-02 DIAGNOSIS — I130 Hypertensive heart and chronic kidney disease with heart failure and stage 1 through stage 4 chronic kidney disease, or unspecified chronic kidney disease: Secondary | ICD-10-CM | POA: Diagnosis not present

## 2019-12-02 DIAGNOSIS — N1832 Chronic kidney disease, stage 3b: Secondary | ICD-10-CM | POA: Diagnosis not present

## 2019-12-02 DIAGNOSIS — I872 Venous insufficiency (chronic) (peripheral): Secondary | ICD-10-CM | POA: Diagnosis not present

## 2019-12-02 DIAGNOSIS — I5022 Chronic systolic (congestive) heart failure: Secondary | ICD-10-CM | POA: Diagnosis not present

## 2019-12-03 ENCOUNTER — Telehealth: Payer: Self-pay

## 2019-12-03 DIAGNOSIS — I5022 Chronic systolic (congestive) heart failure: Secondary | ICD-10-CM | POA: Diagnosis not present

## 2019-12-03 DIAGNOSIS — I482 Chronic atrial fibrillation, unspecified: Secondary | ICD-10-CM | POA: Diagnosis not present

## 2019-12-03 DIAGNOSIS — L03116 Cellulitis of left lower limb: Secondary | ICD-10-CM | POA: Diagnosis not present

## 2019-12-03 DIAGNOSIS — I872 Venous insufficiency (chronic) (peripheral): Secondary | ICD-10-CM | POA: Diagnosis not present

## 2019-12-03 DIAGNOSIS — E559 Vitamin D deficiency, unspecified: Secondary | ICD-10-CM | POA: Diagnosis not present

## 2019-12-03 DIAGNOSIS — J189 Pneumonia, unspecified organism: Secondary | ICD-10-CM | POA: Diagnosis not present

## 2019-12-03 DIAGNOSIS — I495 Sick sinus syndrome: Secondary | ICD-10-CM | POA: Diagnosis not present

## 2019-12-03 DIAGNOSIS — K219 Gastro-esophageal reflux disease without esophagitis: Secondary | ICD-10-CM | POA: Diagnosis not present

## 2019-12-03 DIAGNOSIS — G4733 Obstructive sleep apnea (adult) (pediatric): Secondary | ICD-10-CM | POA: Diagnosis not present

## 2019-12-03 DIAGNOSIS — N1832 Chronic kidney disease, stage 3b: Secondary | ICD-10-CM | POA: Diagnosis not present

## 2019-12-03 DIAGNOSIS — L03115 Cellulitis of right lower limb: Secondary | ICD-10-CM | POA: Diagnosis not present

## 2019-12-03 DIAGNOSIS — I130 Hypertensive heart and chronic kidney disease with heart failure and stage 1 through stage 4 chronic kidney disease, or unspecified chronic kidney disease: Secondary | ICD-10-CM | POA: Diagnosis not present

## 2019-12-03 NOTE — Telephone Encounter (Signed)
Please authorize orders, thanks Brandt Chaney J Jarelis Ehlert, MD  

## 2019-12-03 NOTE — Telephone Encounter (Signed)
[place] Clinical biochemist calling from [place] care for verbal orders for nursing care for patient due to open cut on leg.  Verbal orders can be left on confidential voicemail at [phone] To PCP Talbot Grumbling, RN

## 2019-12-03 NOTE — Telephone Encounter (Signed)
LMOVM informing [place] glover from [facility] verbal orders for pt. Seher Schlagel Kennon Holter, CMA

## 2019-12-06 DIAGNOSIS — E559 Vitamin D deficiency, unspecified: Secondary | ICD-10-CM | POA: Diagnosis not present

## 2019-12-06 DIAGNOSIS — G4733 Obstructive sleep apnea (adult) (pediatric): Secondary | ICD-10-CM | POA: Diagnosis not present

## 2019-12-06 DIAGNOSIS — I482 Chronic atrial fibrillation, unspecified: Secondary | ICD-10-CM | POA: Diagnosis not present

## 2019-12-06 DIAGNOSIS — N1832 Chronic kidney disease, stage 3b: Secondary | ICD-10-CM | POA: Diagnosis not present

## 2019-12-06 DIAGNOSIS — I872 Venous insufficiency (chronic) (peripheral): Secondary | ICD-10-CM | POA: Diagnosis not present

## 2019-12-06 DIAGNOSIS — L03116 Cellulitis of left lower limb: Secondary | ICD-10-CM | POA: Diagnosis not present

## 2019-12-06 DIAGNOSIS — L03115 Cellulitis of right lower limb: Secondary | ICD-10-CM | POA: Diagnosis not present

## 2019-12-06 DIAGNOSIS — I5022 Chronic systolic (congestive) heart failure: Secondary | ICD-10-CM | POA: Diagnosis not present

## 2019-12-06 DIAGNOSIS — J189 Pneumonia, unspecified organism: Secondary | ICD-10-CM | POA: Diagnosis not present

## 2019-12-06 DIAGNOSIS — K219 Gastro-esophageal reflux disease without esophagitis: Secondary | ICD-10-CM | POA: Diagnosis not present

## 2019-12-06 DIAGNOSIS — I130 Hypertensive heart and chronic kidney disease with heart failure and stage 1 through stage 4 chronic kidney disease, or unspecified chronic kidney disease: Secondary | ICD-10-CM | POA: Diagnosis not present

## 2019-12-06 DIAGNOSIS — I495 Sick sinus syndrome: Secondary | ICD-10-CM | POA: Diagnosis not present

## 2019-12-07 ENCOUNTER — Telehealth: Payer: Self-pay

## 2019-12-07 DIAGNOSIS — L03116 Cellulitis of left lower limb: Secondary | ICD-10-CM | POA: Diagnosis not present

## 2019-12-07 DIAGNOSIS — I5022 Chronic systolic (congestive) heart failure: Secondary | ICD-10-CM | POA: Diagnosis not present

## 2019-12-07 DIAGNOSIS — G4733 Obstructive sleep apnea (adult) (pediatric): Secondary | ICD-10-CM | POA: Diagnosis not present

## 2019-12-07 DIAGNOSIS — I495 Sick sinus syndrome: Secondary | ICD-10-CM | POA: Diagnosis not present

## 2019-12-07 DIAGNOSIS — I130 Hypertensive heart and chronic kidney disease with heart failure and stage 1 through stage 4 chronic kidney disease, or unspecified chronic kidney disease: Secondary | ICD-10-CM | POA: Diagnosis not present

## 2019-12-07 DIAGNOSIS — J189 Pneumonia, unspecified organism: Secondary | ICD-10-CM | POA: Diagnosis not present

## 2019-12-07 DIAGNOSIS — K219 Gastro-esophageal reflux disease without esophagitis: Secondary | ICD-10-CM | POA: Diagnosis not present

## 2019-12-07 DIAGNOSIS — I872 Venous insufficiency (chronic) (peripheral): Secondary | ICD-10-CM | POA: Diagnosis not present

## 2019-12-07 DIAGNOSIS — L03115 Cellulitis of right lower limb: Secondary | ICD-10-CM | POA: Diagnosis not present

## 2019-12-07 DIAGNOSIS — I482 Chronic atrial fibrillation, unspecified: Secondary | ICD-10-CM | POA: Diagnosis not present

## 2019-12-07 DIAGNOSIS — N1832 Chronic kidney disease, stage 3b: Secondary | ICD-10-CM | POA: Diagnosis not present

## 2019-12-07 DIAGNOSIS — E559 Vitamin D deficiency, unspecified: Secondary | ICD-10-CM | POA: Diagnosis not present

## 2019-12-07 NOTE — Telephone Encounter (Signed)
Donita, home health nurse, calls nurse line requesting verbal orders for skilled nursing.   1x a week for 5 weeks.   Donita- [phone]

## 2019-12-07 NOTE — Telephone Encounter (Signed)
Called [name], Home Health nurse and gave her VO per Dr. Ardelia Mems for skilled nursing.  Misty Gray, [place]

## 2019-12-07 NOTE — Telephone Encounter (Signed)
Please call and authorize these orders Misty Langenderfer [name], MD  

## 2019-12-13 DIAGNOSIS — L03115 Cellulitis of right lower limb: Secondary | ICD-10-CM | POA: Diagnosis not present

## 2019-12-13 DIAGNOSIS — I482 Chronic atrial fibrillation, unspecified: Secondary | ICD-10-CM | POA: Diagnosis not present

## 2019-12-13 DIAGNOSIS — K219 Gastro-esophageal reflux disease without esophagitis: Secondary | ICD-10-CM | POA: Diagnosis not present

## 2019-12-13 DIAGNOSIS — G4733 Obstructive sleep apnea (adult) (pediatric): Secondary | ICD-10-CM | POA: Diagnosis not present

## 2019-12-13 DIAGNOSIS — I130 Hypertensive heart and chronic kidney disease with heart failure and stage 1 through stage 4 chronic kidney disease, or unspecified chronic kidney disease: Secondary | ICD-10-CM | POA: Diagnosis not present

## 2019-12-13 DIAGNOSIS — I872 Venous insufficiency (chronic) (peripheral): Secondary | ICD-10-CM | POA: Diagnosis not present

## 2019-12-13 DIAGNOSIS — I5022 Chronic systolic (congestive) heart failure: Secondary | ICD-10-CM | POA: Diagnosis not present

## 2019-12-13 DIAGNOSIS — N1832 Chronic kidney disease, stage 3b: Secondary | ICD-10-CM | POA: Diagnosis not present

## 2019-12-13 DIAGNOSIS — I495 Sick sinus syndrome: Secondary | ICD-10-CM | POA: Diagnosis not present

## 2019-12-13 DIAGNOSIS — L03116 Cellulitis of left lower limb: Secondary | ICD-10-CM | POA: Diagnosis not present

## 2019-12-13 DIAGNOSIS — J189 Pneumonia, unspecified organism: Secondary | ICD-10-CM | POA: Diagnosis not present

## 2019-12-13 DIAGNOSIS — E559 Vitamin D deficiency, unspecified: Secondary | ICD-10-CM | POA: Diagnosis not present

## 2019-12-14 DIAGNOSIS — I482 Chronic atrial fibrillation, unspecified: Secondary | ICD-10-CM | POA: Diagnosis not present

## 2019-12-14 DIAGNOSIS — I495 Sick sinus syndrome: Secondary | ICD-10-CM | POA: Diagnosis not present

## 2019-12-14 DIAGNOSIS — K219 Gastro-esophageal reflux disease without esophagitis: Secondary | ICD-10-CM | POA: Diagnosis not present

## 2019-12-14 DIAGNOSIS — J189 Pneumonia, unspecified organism: Secondary | ICD-10-CM | POA: Diagnosis not present

## 2019-12-14 DIAGNOSIS — L03116 Cellulitis of left lower limb: Secondary | ICD-10-CM | POA: Diagnosis not present

## 2019-12-14 DIAGNOSIS — L03115 Cellulitis of right lower limb: Secondary | ICD-10-CM | POA: Diagnosis not present

## 2019-12-14 DIAGNOSIS — I872 Venous insufficiency (chronic) (peripheral): Secondary | ICD-10-CM | POA: Diagnosis not present

## 2019-12-14 DIAGNOSIS — I130 Hypertensive heart and chronic kidney disease with heart failure and stage 1 through stage 4 chronic kidney disease, or unspecified chronic kidney disease: Secondary | ICD-10-CM | POA: Diagnosis not present

## 2019-12-14 DIAGNOSIS — G4733 Obstructive sleep apnea (adult) (pediatric): Secondary | ICD-10-CM | POA: Diagnosis not present

## 2019-12-14 DIAGNOSIS — N1832 Chronic kidney disease, stage 3b: Secondary | ICD-10-CM | POA: Diagnosis not present

## 2019-12-14 DIAGNOSIS — E559 Vitamin D deficiency, unspecified: Secondary | ICD-10-CM | POA: Diagnosis not present

## 2019-12-14 DIAGNOSIS — I5022 Chronic systolic (congestive) heart failure: Secondary | ICD-10-CM | POA: Diagnosis not present

## 2019-12-15 ENCOUNTER — Telehealth: Payer: Self-pay

## 2019-12-15 DIAGNOSIS — L03115 Cellulitis of right lower limb: Secondary | ICD-10-CM | POA: Diagnosis not present

## 2019-12-15 DIAGNOSIS — I5022 Chronic systolic (congestive) heart failure: Secondary | ICD-10-CM | POA: Diagnosis not present

## 2019-12-15 DIAGNOSIS — K219 Gastro-esophageal reflux disease without esophagitis: Secondary | ICD-10-CM | POA: Diagnosis not present

## 2019-12-15 DIAGNOSIS — I130 Hypertensive heart and chronic kidney disease with heart failure and stage 1 through stage 4 chronic kidney disease, or unspecified chronic kidney disease: Secondary | ICD-10-CM | POA: Diagnosis not present

## 2019-12-15 DIAGNOSIS — N1832 Chronic kidney disease, stage 3b: Secondary | ICD-10-CM | POA: Diagnosis not present

## 2019-12-15 DIAGNOSIS — G4733 Obstructive sleep apnea (adult) (pediatric): Secondary | ICD-10-CM | POA: Diagnosis not present

## 2019-12-15 DIAGNOSIS — I872 Venous insufficiency (chronic) (peripheral): Secondary | ICD-10-CM | POA: Diagnosis not present

## 2019-12-15 DIAGNOSIS — J189 Pneumonia, unspecified organism: Secondary | ICD-10-CM | POA: Diagnosis not present

## 2019-12-15 DIAGNOSIS — E559 Vitamin D deficiency, unspecified: Secondary | ICD-10-CM | POA: Diagnosis not present

## 2019-12-15 DIAGNOSIS — I482 Chronic atrial fibrillation, unspecified: Secondary | ICD-10-CM | POA: Diagnosis not present

## 2019-12-15 DIAGNOSIS — I495 Sick sinus syndrome: Secondary | ICD-10-CM | POA: Diagnosis not present

## 2019-12-15 DIAGNOSIS — L03116 Cellulitis of left lower limb: Secondary | ICD-10-CM | POA: Diagnosis not present

## 2019-12-15 NOTE — Telephone Encounter (Signed)
[facility] calling requesting verbal orders for dressing changes 2 times/week with vaseline gauze and a medical social worker order.   Can leave verbal orders on confidential voicemail: [phone]  Talbot Grumbling, RN

## 2019-12-17 DIAGNOSIS — R0902 Hypoxemia: Secondary | ICD-10-CM | POA: Diagnosis not present

## 2019-12-17 DIAGNOSIS — G4733 Obstructive sleep apnea (adult) (pediatric): Secondary | ICD-10-CM | POA: Diagnosis not present

## 2019-12-20 NOTE — Telephone Encounter (Signed)
LMOVM informing donetta of verbal orders. Gagandeep Pettet Bruna Potter, CMA

## 2019-12-20 NOTE — Telephone Encounter (Signed)
Please call and authorize these orders. Thanks, Latrelle Dodrill, MD

## 2019-12-29 ENCOUNTER — Other Ambulatory Visit: Payer: Self-pay | Admitting: Family Medicine

## 2020-01-12 ENCOUNTER — Other Ambulatory Visit: Payer: Self-pay

## 2020-01-12 ENCOUNTER — Ambulatory Visit (HOSPITAL_COMMUNITY)
Admission: RE | Admit: 2020-01-12 | Discharge: 2020-01-12 | Disposition: A | Discharge status: Home / Self Care | Payer: Medicare HMO | Source: Ambulatory Visit | Attending: Internal Medicine | Admitting: Internal Medicine

## 2020-01-12 DIAGNOSIS — I482 Chronic atrial fibrillation, unspecified: Secondary | ICD-10-CM

## 2020-01-12 DIAGNOSIS — I493 Ventricular premature depolarization: Secondary | ICD-10-CM

## 2020-01-12 DIAGNOSIS — I5022 Chronic systolic (congestive) heart failure: Secondary | ICD-10-CM | POA: Diagnosis not present

## 2020-01-12 DIAGNOSIS — I519 Heart disease, unspecified: Secondary | ICD-10-CM

## 2020-01-12 MED ORDER — APIXABAN 5 MG PO TABS: 5.0000 mg | ORAL_TABLET | Freq: Two times a day (BID) | ORAL | 3 refills | Status: DC

## 2020-01-12 MED ORDER — AMIODARONE HCL 200 MG PO TABS: 100.0000 mg | ORAL_TABLET | Freq: Every day | ORAL | 3 refills | Status: DC

## 2020-01-12 NOTE — Progress Notes (Signed)
Heart Failure TeleHealth Note  Due to national recommendations of social distancing due to COVID 19, Audio/video telehealth visit is felt to be most appropriate for this patient at this time.  See MyChart message from today for patient consent regarding telehealth for [facility].  Date:  01/12/2020   ID:  Misty Gray, DOB February 27, 1951, MRN [id number]  Location: Home  Provider location: [facility] Type of Visit: Established patient  PCP:  Latrelle Dodrill, MD  Cardiologist:  No primary care provider on file. Primary HF: Airik Goodlin  Chief Complaint: Heart Failure follow-up   History of Present Illness:  Misty Gray is a 69 y/o woman with history of morbid obesity, OSA, chronic atrial fibrillation with tachy-brady syndrome s/p PPM, frequent PVCs, systolic HF due to NICM.  Admitted for ADHF in 10/17. Cath with normal coronary arteries and elevated filling pressures. Echo 10/17 EF 30-35% (previously 35-40%). Felt to have possible PVC or RV pacing cardiomyopathy. (She has 81% RV pacing and 15-20 PVCs per minute). Started on po amio with suppression of PVCs. Diuresed 41 pounds. Discharge weight 197.   Admitted 12/14 through 12/06/2016 with marked volume overload. Discharge weight was 195 pounds.Transitioned to torsemide 40 mg in am and 20 mg in pm.   Echo 07/2017 LVEF 25-30%, Trivial AI, Mod MR, Severe LAE, Mild RV dilation, Mild RAE, PA peak pressure 31 mm Hg.  Echo 1/20 EF 25-30% (no improvement with PVC suppression)  She presents via audio/video conferencing for a telehealth visit today  At last visit had gained about 40 pounds due to "nibbling". Says she is feeling pretty good. Says she is having some allergies. Still SOB with mild activity. No change. Weight this am 194.2 (was 220 pounds when I saw her last year). Denies CP, orthopnea or PND. Edema well controlled. On endoxaban. No bleeding. Insurance won't pay for that. Wants Eliquis. Has a CPAP  machine, but hasn't used it much.   Studies:  [facility] 10/03/16 Ao = 94/58 (72) LV = 104/20 RA = 21 RV = 65/4/19 PA = 65/21 (38) PCW = 23 Fick cardiac output/index = 6.1/2.9 PVR = 2.5 WU FA sat = 97%  PA sat = 69%, 70%  Assessment: 1. Normal coronary arteries 2. NICM with EF 30-35% by echo 3. Persistently elevated biventricular pressures    Misty Gray denies symptoms worrisome for COVID 19.   Past Medical History:  Diagnosis Date  . Abnormal CT of the head 12/16/1984   R parietal atrophy  . Allergic rhinitis, cause unspecified   . Altered mental status 11/28/2016  . Anemia   . Arthritis    "hands and knees" (09/06/2015)  . Atrial fibrillation (HCC)   . Cellulitis 09/07/2015  . Cellulitis and abscess of leg 09/2016   bilateral  . CHF (congestive heart failure) (HCC)   . Diaphragmatic hernia without mention of obstruction or gangrene   . Dyspnea   . Exertional dyspnea 06/22/12  . Family history of adverse reaction to anesthesia    "daughter fights w/them; just can't relax during OR; stay awake during the OR"  . Fatigue 06/22/2012  . Female stress incontinence   . GERD (gastroesophageal reflux disease)   . Grand mal seizure (HCC)   . H/O hiatal hernia    two removed  . Heart murmur   . High cholesterol   . History of blood transfusion 1956   "related to nose bleed"  . Hypertension   . Influenza A 01/11/2014  .  Lichenification and lichen simplex chronicus   . Migraine    "when I was a teenager"  . Morbid obesity (HCC)   . Nonischemic cardiomyopathy (HCC)    EF has normalized-repeat Pending   . On home oxygen therapy    "2L when I'm asleep in bed" (09/06/2015)  . OSA on CPAP   . Pacemaker  st judes    Patient states "this is my third pacemaker"  . Pneumonia 2004; 2011; 11/2011  . Seizures (HCC) since 1981   "can hear you talking but sounds like you are in big tunnel; have them often if not taking RX; last one was 06/17/12" (09/06/2015)  . Shortness of breath  10/24/2010   Qualifier: Diagnosis of  By: [place], Bonnie    . Sick sinus syndrome (HCC)    WITH PRIOR DDD PACEMAKER IMPLANTATION  . Somnolence   . Stroke [facility] 1988   "mouth drawed real bad on my left side; found out it was a seizure"  . Syncope and collapse 06/22/12   "hit forehead and left knee"; denies loss of consciousness  . Unspecified venous (peripheral) insufficiency    Past Surgical History:  Procedure Laterality Date  . APPENDECTOMY    . CARDIAC CATHETERIZATION N/A 10/03/2016   Procedure: Right/Left Heart Cath and Coronary Angiography;  Surgeon: Dolores Patty, MD;  Location: [facility] INVASIVE CV LAB;  Service: Cardiovascular;  Laterality: N/A;  . HERNIA REPAIR  ? 1988; 04/15/1998  . INSERT / REPLACE / REMOVE PACEMAKER  12/16/1990   DDD EF 30%;  Marland Kitchen INSERT / REPLACE / REMOVE PACEMAKER  07/25/2003   replaced by BB, leads are both IS1 and from 1992  . INSERT / REPLACE / REMOVE PACEMAKER  05/21/2011   JA; generator change  . LEG SKIN LESION  BIOPSY / EXCISION  05/16/2001   Lichen Planus  . VENTRAL HERNIA REPAIR  1989; 04/15/1998     Current Outpatient Medications  Medication Sig Dispense Refill  . amiodarone (PACERONE) 200 MG tablet TAKE 1 TABLET BY MOUTH ONCE DAILY. (Patient taking differently: Take 200 mg by mouth daily. ) 28 tablet 10  . cholecalciferol (VITAMIN D) 1000 units tablet Take 1 tablet (1,000 Units total) by mouth daily. 90 tablet 3  . fluticasone (FLONASE) 50 MCG/ACT nasal spray Place 2 sprays into both nostrils daily as needed. For congestion. 16 g 1  . omeprazole (PRILOSEC) 20 MG capsule TAKE (1) CAPSULE BY MOUTH ONCE DAILY. (Patient taking differently: Take 20 mg by mouth daily. ) 28 capsule 11  . PHENobarbital (LUMINAL) 32.4 MG tablet TAKE (2) TABLETS BY MOUTH TWICE DAILY. (MORNING AND BEDTIME) 112 tablet 3  . potassium chloride (K-DUR) 10 MEQ tablet TAKE 4 TABLETS BY MOUTH THREE TIMES DAILY (MORNING, NOON, AND NIGHT) (Patient taking differently: Take 40 mEq by mouth 3  (three) times daily. ) 336 tablet 11  . SAVAYSA 60 MG TABS tablet TAKE 1 TABLET BY MOUTH ONCE DAILY. (Patient taking differently: Take 60 mg by mouth daily. ) 28 tablet 11  . torsemide (DEMADEX) 20 MG tablet TAKE (2) TABLETS BY MOUTH TWICE DAILY. (Patient taking differently: Take 40 mg by mouth 2 (two) times daily. ) 112 tablet 11  . triamcinolone cream (KENALOG) 0.5 % Apply topically 3 (three) times daily. 30 g 0   No current facility-administered medications for this encounter.    Allergies:   Aspirin, Penicillins, Tape, Wool alcohol [lanolin], Amoxicillin, and Ampicillin   Social History:  The patient  reports that she quit smoking about 50  years ago. Her smoking use included cigarettes. She has a 7.00 pack-year smoking history. She has never used smokeless tobacco. She reports that she does not drink alcohol or use drugs.   Family History:  The patient's family history includes Cancer in her mother and sister; Diabetes in her son; Heart disease in her father; Hypertension in her brother and sister; Rheum arthritis in her daughter; Sleep apnea in her son; Stroke in her father.   ROS:  Please see the history of present illness.   All other systems are personally reviewed and negative.   Exam:  (Video/Tele Health Call; Exam is subjective and or/visual.) General:  Speaks in full sentences. No resp difficulty. Lungs: Normal respiratory effort with conversation.  Abdomen: Obese. Non-distended per patient report Extremities: Pt denies edema. Neuro: Alert & oriented x 3.   Recent Labs: 11/08/2019: ALT 12; B Natriuretic Peptide 4,306.1 11/10/2019: Magnesium 1.8 11/17/2019: BUN 13; Creatinine, Ser 1.07; Hemoglobin 13.5; Platelets 199; Potassium 5.0; Sodium 136  Personally reviewed   Wt Readings from Last 3 Encounters:  11/17/19 98.7 kg (217 lb 9.6 oz)  11/12/19 100 kg (220 lb 7.4 oz)  08/02/19 102.9 kg (226 lb 12.8 oz)      ASSESSMENT AND PLAN:  1. Chronic systolic HF due to NICM. ?  PVC induced vs RV pacing. Echo 12/06/2016 EF 10-15%. Cath 10/17 normal coronary arteries.  Echo 02/2017 with LVEF 15-20% despite PVC suppression. Echo 07/2017 EF 25-30%.  - Echo 07/2017 LVEF 25-30%, Trivial AI, Mod MR, Severe LAE, Mild RV dilation, Mild RAE, PA peak pressure 31 mm Hg. - Echo 1/20 EF 25-30% - Stable NYHA III symptoms - Volume status appears stable. Weight down 30 pounds. - Continue toresemide 40mg  BID.  - Unable to tolerate beta blocker. - Has failed losartan 12.5 mg hs due to hypotension.  - Reinforced dietary and fluid restrictions.  - has followed with EP. Not felt to be candidate for CRT upgrade to alleviate RV pacing.  - Would not use SGLT2i given high risk of genital yeast infection  2. Frequent PVCs - Suppressed. Decrease Amio 100 mg daily.   - CMET and TSH today.   3. Obesity  - Weight down 30 pounds in last year. Continue weight loss efforts  4. Chronic AF with tachy-brady s/p pacemaker - Rate stable.  - Switch edoxaban to Eliquis. Denies bleeding.  - Follows with EP.   COVID screen The patient does not have any symptoms that suggest any further testing/ screening at this time.  Social distancing reinforced today.  Recommended follow-up:  As above  Relevant cardiac medications were reviewed at length with the patient today.   The patient does not have concerns regarding their medications at this time.   The following changes were made today:  As above  Today, I have spent 16 minutes with the patient with telehealth technology discussing the above issues .    Signed, Glori Bickers, MD  01/12/2020 1:12 PM  [facility] [place] [address] [facility] and [place] [address] [phone] (office) [phone] (fax)

## 2020-01-12 NOTE — Addendum Note (Signed)
Encounter addended by: Nicole Cella, RN on: 01/12/2020 4:46 PM  Actions taken: Clinical Note Signed

## 2020-01-12 NOTE — Progress Notes (Signed)
Called pt to review med changes. No further questions at this time.

## 2020-01-12 NOTE — Progress Notes (Signed)
1. Stop edoxaban  2. Start Eliquis 5 bid ([facility])  3. Decrease amiodarone 100 daily  4. F/u 6 months.   Routed to scheduler to place on recall.

## 2020-01-12 NOTE — Patient Instructions (Signed)
STOP Edoxaban  START Apixaban 5mg  tab two times daily.  DECREASE Amiodarone to 100mg  (0.5 tab) daily.  Please follow up with the [facility] in 6 months.  At the [facility], you and your health needs are our priority. As part of our continuing mission to provide you with exceptional heart care, we have created designated Provider Care Teams. These Care Teams include your primary Cardiologist (physician) and Advanced Practice Providers (APPs- Physician Assistants and Nurse Practitioners) who all work together to provide you with the care you need, when you need it.   You may see any of the following providers on your designated Care Team at your next follow up: Dr . Dr Marland Kitchen . Arvilla Meres, NP . Marca Ancona, PA . Tonye Becket, PharmD   Please be sure to bring in all your medications bottles to every appointment.

## 2020-01-12 NOTE — Addendum Note (Signed)
Encounter addended by: Nicole Cella, RN on: 01/12/2020 4:43 PM  Actions taken: Order Reconciliation Section accessed, Order list changed, Clinical Note Signed

## 2020-01-17 DIAGNOSIS — G4733 Obstructive sleep apnea (adult) (pediatric): Secondary | ICD-10-CM | POA: Diagnosis not present

## 2020-01-17 DIAGNOSIS — R0902 Hypoxemia: Secondary | ICD-10-CM | POA: Diagnosis not present

## 2020-01-25 ENCOUNTER — Other Ambulatory Visit: Payer: Self-pay | Admitting: Family Medicine

## 2020-01-26 ENCOUNTER — Telehealth: Payer: Self-pay | Admitting: *Deleted

## 2020-01-26 NOTE — Telephone Encounter (Signed)
Received fax from pharmacy, PA needed on Phenobarbitol.  Clinical questions have been placed in Dr. Valorie Roosevelt box for completion.  Please return to RN Team   Cover My Meds info: Key: [id number]  Jone Baseman, CMA

## 2020-01-26 NOTE — Telephone Encounter (Signed)
Returned PA form to [employer] since I am not sure how to answer the questions. She is going to call patient's insurance company for more info  Latrelle Dodrill, MD

## 2020-01-27 NOTE — Telephone Encounter (Signed)
It appears the issue is a drug interaction with apixaban, and these two medications are strongly to be avoided in combination whenever possible. Patient was recently switched from edoxaban to apixaban by her cardiologist. I have reached out to him for his thoughts on whether she can be changed to a different anticoagulant. Will await his word on this.  Latrelle Dodrill, MD

## 2020-01-27 NOTE — Telephone Encounter (Signed)
Insurance requesting 2 things:  1. Verification that provider is ok with Eliquis and phenobarbital to be taken together 2. To see if strength can be changed to require less amount of pills daily for pt.   Dr. Pollie Meyer will consult with cards on interaction and will then change strength to 68.4 mg BID if possible.  Jone Baseman, CMA

## 2020-01-28 DIAGNOSIS — G4733 Obstructive sleep apnea (adult) (pediatric): Secondary | ICD-10-CM | POA: Diagnosis not present

## 2020-01-31 ENCOUNTER — Telehealth (HOSPITAL_COMMUNITY): Payer: Self-pay | Admitting: Pharmacist

## 2020-01-31 MED ORDER — EDOXABAN TOSYLATE 60 MG PO TABS: 60.0000 mg | ORAL_TABLET | Freq: Every day | ORAL | 11 refills | Status: DC

## 2020-01-31 NOTE — Telephone Encounter (Signed)
Advanced Heart Failure Patient Advocate Encounter  Prior Authorization for Savaysa (edoxaban) has been approved.  Changed from Eliquis due to drug interaction with phenobarbital.   Effective dates: 01/31/20 through 12/15/20  Karle Plumber, PharmD, BCPS, BCCP, CPP Heart Failure Clinic Pharmacist [phone]

## 2020-02-01 NOTE — Telephone Encounter (Signed)
Seems to have been taken care of thru Cardiology.   Jone Baseman, CMA

## 2020-02-04 ENCOUNTER — Telehealth: Payer: Self-pay | Admitting: Family Medicine

## 2020-02-04 NOTE — Telephone Encounter (Signed)
Received CPAP order supplies to sign for patient Red team can you contact patient and find out: - did she initiate this request? - what is her CPAP pressure setting?  Thanks Latrelle Dodrill, MD

## 2020-02-07 NOTE — Telephone Encounter (Signed)
Called and spoke to patient concerning request for CPAP machine.  Patient states that her machine is set on 2 because anything higher takes her breath away.  Patient states that she needs a new unit because her tank is leaking water. Patient initiated request through [facility].  Glennie Hawk, CMA

## 2020-02-10 NOTE — Telephone Encounter (Signed)
Forms completed, will fax back Latrelle Dodrill, MD

## 2020-02-14 DIAGNOSIS — G4733 Obstructive sleep apnea (adult) (pediatric): Secondary | ICD-10-CM | POA: Diagnosis not present

## 2020-02-14 DIAGNOSIS — R0902 Hypoxemia: Secondary | ICD-10-CM | POA: Diagnosis not present

## 2020-02-18 DIAGNOSIS — Z03818 Encounter for observation for suspected exposure to other biological agents ruled out: Secondary | ICD-10-CM | POA: Diagnosis not present

## 2020-02-18 DIAGNOSIS — Z20822 Contact with and (suspected) exposure to covid-19: Secondary | ICD-10-CM | POA: Diagnosis not present

## 2020-02-18 DIAGNOSIS — I10 Essential (primary) hypertension: Secondary | ICD-10-CM | POA: Diagnosis not present

## 2020-02-28 ENCOUNTER — Encounter: Payer: Self-pay | Admitting: Podiatry

## 2020-02-28 ENCOUNTER — Ambulatory Visit: Payer: Medicare HMO | Admitting: Podiatry

## 2020-02-28 ENCOUNTER — Other Ambulatory Visit: Payer: Self-pay

## 2020-02-28 VITALS — Temp 96.0°F

## 2020-02-28 DIAGNOSIS — M79674 Pain in right toe(s): Secondary | ICD-10-CM

## 2020-02-28 DIAGNOSIS — B351 Tinea unguium: Secondary | ICD-10-CM

## 2020-02-28 DIAGNOSIS — Q828 Other specified congenital malformations of skin: Secondary | ICD-10-CM | POA: Diagnosis not present

## 2020-02-28 DIAGNOSIS — L84 Corns and callosities: Secondary | ICD-10-CM | POA: Diagnosis not present

## 2020-02-28 DIAGNOSIS — M79675 Pain in left toe(s): Secondary | ICD-10-CM | POA: Diagnosis not present

## 2020-02-28 DIAGNOSIS — I739 Peripheral vascular disease, unspecified: Secondary | ICD-10-CM

## 2020-02-28 NOTE — Patient Instructions (Signed)

## 2020-03-05 NOTE — Progress Notes (Signed)
Subjective: Misty Gray presents today for follow up of corn(s) left 3rd digit, callus(es) right hallux, painful plantar lesions b/l feet and painful mycotic nails b/l.  Pain interferes with ambulation. Aggravating factors include weightbearing with and without wearing enclosed shoe gear. Pain is relieved with periodic professional debridement.  Allergies  Allergen Reactions  . Aspirin Hives and Rash  . Penicillins Rash and Other (See Comments)    Has patient had a PCN reaction causing immediate rash, facial/tongue/throat swelling, SOB or lightheadedness with hypotension: Yes Has patient had a PCN reaction causing severe rash involving mucus membranes or skin necrosis: No Has patient had a PCN reaction that required hospitalization No Has patient had a PCN reaction occurring within the last 10 years: Yes If all of the above answers are "NO", then may proceed with Cephalosporin use.  Patient has tolerated cephalosporins in 2017.  . Tape Other (See Comments)    NO PAPER TAPE-MUST BE MEDICAL TAPE - unknown reaction NO PAPER TAPE-MUST BE MEDICAL TAPE - unknown reaction  . Wool Alcohol [Lanolin] Hives  . Amoxicillin Rash    Patient has tolerated cephalosporins  . Ampicillin Itching and Rash    Patient has tolerated cephalosporins     Objective: Vitals:   02/28/20 1357  Temp: (!) 96 F (35.6 C)    Pt 69 y.o. year old Caucasian female  in NAD. AAO x 3.   Vascular Examination:  Capillary refill time to digits immediate b/l. Faintly palpable DP pulses b/l. Faintly palpable PT pulses b/l. Pedal hair absent b/l Skin temperature gradient warm to cool b/l. +1 pitting edema b/l LE. Varicosities present b/l.  Dermatological Examination: Pedal skin is thin shiny, atrophic bilaterally. No open wounds bilaterally. No interdigital macerations bilaterally. Toenails 1-5 b/l elongated, dystrophic, thickened, crumbly with subungual debris and tenderness to dorsal palpation. Hyperkeratotic lesion(s)  distal tip left 3rd digit and right hallux IPJ.  No erythema, no edema, no drainage, no flocculence. Porokeratotic lesion(s) submet head 3 left, submet head 1 right foot. No erythema, no edema, no drainage, no flocculence.  Musculoskeletal: Normal muscle strength 5/5 to all lower extremity muscle groups bilaterally, no pain crepitus or joint limitation noted with ROM b/l and hammertoes noted to the  L 2nd toe and R 2nd toe  Neurological: Protective sensation decreased with 10 gram monofilament b/l Vibratory sensation decreased b/l  Assessment: 1. Pain due to onychomycosis of toenails of both feet   2. Corns and callosities   3. Porokeratosis   4. PVD (peripheral vascular disease) (HCC)    Plan: -Toenails 1-5 b/l were debrided in length and girth with sterile nail nippers and dremel without iatrogenic bleeding.  -Corn(s) left 3rd digit and callus(es) right hallux were debrided without complication or incident. Total number debrided =2. -Painful porokeratotic lesion(s) submet head 3 left foot and submet head 1 right foot pared and enucleated with sterile scalpel blade without incident. -Patient to continue soft, supportive shoe gear daily. -Patient to report any pedal injuries to medical professional immediately. -Patient/POA to call should there be question/concern in the interim.  Return in about 3 months (around 05/30/2020) for nail and callus trim.

## 2020-03-16 DIAGNOSIS — G4733 Obstructive sleep apnea (adult) (pediatric): Secondary | ICD-10-CM | POA: Diagnosis not present

## 2020-03-16 DIAGNOSIS — R0902 Hypoxemia: Secondary | ICD-10-CM | POA: Diagnosis not present

## 2020-04-15 ENCOUNTER — Telehealth: Payer: Self-pay | Admitting: Family Medicine

## 2020-04-18 NOTE — Telephone Encounter (Signed)
Please ask patient to schedule a follow up in person visit with me Thanks Latrelle Dodrill, MD

## 2020-04-19 NOTE — Telephone Encounter (Signed)
Spoke to patient and she states that her husband is hospitalized and that she will call and schedule an appointment when she can talk to her son about transportation.  Glennie Hawk, CMA

## 2020-05-09 ENCOUNTER — Ambulatory Visit (INDEPENDENT_AMBULATORY_CARE_PROVIDER_SITE_OTHER): Payer: Medicare HMO | Admitting: Family Medicine

## 2020-05-09 ENCOUNTER — Encounter: Payer: Self-pay | Admitting: Family Medicine

## 2020-05-09 ENCOUNTER — Other Ambulatory Visit: Payer: Self-pay

## 2020-05-09 DIAGNOSIS — G40909 Epilepsy, unspecified, not intractable, without status epilepticus: Secondary | ICD-10-CM | POA: Diagnosis not present

## 2020-05-09 DIAGNOSIS — K219 Gastro-esophageal reflux disease without esophagitis: Secondary | ICD-10-CM

## 2020-05-09 DIAGNOSIS — I872 Venous insufficiency (chronic) (peripheral): Secondary | ICD-10-CM

## 2020-05-09 DIAGNOSIS — Z1211 Encounter for screening for malignant neoplasm of colon: Secondary | ICD-10-CM | POA: Diagnosis not present

## 2020-05-09 DIAGNOSIS — R7989 Other specified abnormal findings of blood chemistry: Secondary | ICD-10-CM

## 2020-05-09 DIAGNOSIS — E559 Vitamin D deficiency, unspecified: Secondary | ICD-10-CM | POA: Diagnosis not present

## 2020-05-09 NOTE — Patient Instructions (Signed)
It was great to see you again today!  Checking labs today - kidneys, vitamin D, cholesterol  If you decide to get the COVID vaccine, here is the info. I strongly recommend you get it. Vaccine Clinic Info: [facility] [address] [place], [place] [address] Hours: Mon,Thu 8-5, Sat 8-12 Type: Pfizer (12+) [phone] More info at [email or web address]   Follow up in 6 months, sooner if needed  Be well, Dr. Pollie Meyer

## 2020-05-09 NOTE — Progress Notes (Signed)
  Date of Visit: 05/09/2020   SUBJECTIVE:   HPI:  Misty Gray presents today for routine follow up.  Seizures - currently taking phenobarbital 64.8mg  twice daily. Tolerating well. No recent seizures. Does not drive. Has had breakthrough seizures in the past if she misses doses.  GERD - taking prilosec 20mg  daily. Tolerating well. Avoids spicy foods to help control reflux symptoms.   Leg blisters - has chronic lower extremity edema and erythema. Has had some blistering of her legs lately, overall doing well though.   Obesity - admits to dietary indiscretions. Motivated to try and lose some weight.   Vit D deficiency - history of deficiency, taking daily supplement  OBJECTIVE:   BP 100/65   Pulse 78   Ht 4' 9.87" (1.47 m)   Wt 201 lb (91.2 kg)   SpO2 95%   BMI 42.19 kg/m  Gen: no acute distress, pleasant cooperative HEENT: normocephalic, atraumatic  Heart: irregularly irregular, no murmur  Lungs: clear to auscultation bilaterally, normal work of breathing  Neuro: speech noraml Ext: chronic lower extremity edema and erythema with some dry skin and healing blisers but no purulence or signs of infection.  ASSESSMENT/PLAN:   Health maintenance:  -encouraged COVID vaccination and attempted to offer education on vaccine, however patient adamantly declines -states she is not a candidate for colonoscopy but agreeable with doing cologuard, ordered -recommended Tdap vaccine as she is past due  Vitamin D deficiency Update Vit D level today  Chronic venous stasis dermatitis No current signs of infection, continue to monitor Weight loss will likely help  Morbid obesity (HCC) Encouraged healthy eating and weight loss Check lipids today  GASTROESOPHAGEAL REFLUX, NO ESOPHAGITIS Well controlled. Continue current medication regimen.   Seizure disorder (HCC) Well controlled. Continue current medication regimen.   FOLLOW UP: Follow up in 6 months for routine medical problems   05-14-1975 J. [place], MD [facility]

## 2020-05-10 LAB — BASIC METABOLIC PANEL
BUN/Creatinine Ratio: 22 (ref 12–28)
BUN: 20 mg/dL (ref 8–27)
CO2: 25 mmol/L (ref 20–29)
Calcium: 9.9 mg/dL (ref 8.7–10.3)
Chloride: 101 mmol/L (ref 96–106)
Creatinine, Ser: 0.93 mg/dL (ref 0.57–1.00)
GFR calc Af Amer: 72 mL/min/{1.73_m2} (ref >59)
GFR calc non Af Amer: 62 mL/min/{1.73_m2} (ref >59)
Glucose: 92 mg/dL (ref 65–99)
Potassium: 4.2 mmol/L (ref 3.5–5.2)
Sodium: 141 mmol/L (ref 134–144)

## 2020-05-10 LAB — LIPID PANEL
Chol/HDL Ratio: 2.2 ratio (ref 0.0–4.4)
Cholesterol, Total: 128 mg/dL (ref 100–199)
HDL: 57 mg/dL (ref >39)
LDL Chol Calc (NIH): 60 mg/dL (ref 0–99)
Triglycerides: 44 mg/dL (ref 0–149)
VLDL Cholesterol Cal: 11 mg/dL (ref 5–40)

## 2020-05-10 LAB — VITAMIN D 25 HYDROXY (VIT D DEFICIENCY, FRACTURES): Vit D, 25-Hydroxy: 47.6 ng/mL (ref 30.0–100.0)

## 2020-05-11 NOTE — Assessment & Plan Note (Signed)
Update Vit D level today

## 2020-05-11 NOTE — Assessment & Plan Note (Signed)
Well controlled. Continue current medication regimen.  

## 2020-05-11 NOTE — Assessment & Plan Note (Signed)
Encouraged healthy eating and weight loss Check lipids today

## 2020-05-11 NOTE — Assessment & Plan Note (Signed)
No current signs of infection, continue to monitor Weight loss will likely help

## 2020-05-12 ENCOUNTER — Encounter: Payer: Self-pay | Admitting: Family Medicine

## 2020-05-16 DIAGNOSIS — G4733 Obstructive sleep apnea (adult) (pediatric): Secondary | ICD-10-CM | POA: Diagnosis not present

## 2020-05-16 DIAGNOSIS — R0902 Hypoxemia: Secondary | ICD-10-CM | POA: Diagnosis not present

## 2020-05-31 DIAGNOSIS — G4733 Obstructive sleep apnea (adult) (pediatric): Secondary | ICD-10-CM | POA: Diagnosis not present

## 2020-06-02 ENCOUNTER — Ambulatory Visit: Payer: Medicare HMO | Admitting: Podiatry

## 2020-06-02 ENCOUNTER — Other Ambulatory Visit: Payer: Self-pay

## 2020-06-02 ENCOUNTER — Encounter: Payer: Self-pay | Admitting: Podiatry

## 2020-06-02 DIAGNOSIS — B351 Tinea unguium: Secondary | ICD-10-CM

## 2020-06-02 DIAGNOSIS — I739 Peripheral vascular disease, unspecified: Secondary | ICD-10-CM

## 2020-06-02 DIAGNOSIS — M79675 Pain in left toe(s): Secondary | ICD-10-CM

## 2020-06-02 DIAGNOSIS — Q828 Other specified congenital malformations of skin: Secondary | ICD-10-CM | POA: Diagnosis not present

## 2020-06-02 DIAGNOSIS — L84 Corns and callosities: Secondary | ICD-10-CM

## 2020-06-02 DIAGNOSIS — M79674 Pain in right toe(s): Secondary | ICD-10-CM

## 2020-06-02 NOTE — Patient Instructions (Signed)
Float heels when in bed   Pressure Injury  A pressure injury is damage to the skin and underlying tissue that results from pressure being applied to an area of the body. It often affects people who must spend a long time in a bed or chair because of a medical condition. Pressure injuries usually occur:  Over bony parts of the body, such as the tailbone, shoulders, elbows, hips, heels, spine, ankles, and back of the head.  Under medical devices that make contact with the body, such as respiratory equipment, stockings, tubes, and splints. Pressure injuries start as reddened areas on the skin and can lead to pain and an open wound. What are the causes? This condition is caused by frequent or constant pressure to an area of the body. Decreased blood flow to the skin can eventually cause the skin tissue to die and break down, causing a wound. What increases the risk? You are more likely to develop this condition if you:  Are in the hospital or an extended care facility.  Are bedridden or in a wheelchair.  Have an injury or disease that keeps you from: ? Moving normally. ? Feeling pain or pressure.  Have a condition that: ? Makes you sleepy or less alert. ? Causes poor blood flow.  Need to wear a medical device.  Have poor control of your bladder or bowel functions (incontinence).  Have poor nutrition (malnutrition). If you are at risk for pressure injuries, your health care provider may recommend certain types of mattresses, mattress covers, pillows, cushions, or boots to help prevent them. These may include products filled with air, foam, gel, or sand. What are the signs or symptoms? Symptoms of this condition depend on the severity of the injury. Symptoms may include:  Red or dark areas of the skin.  Pain, warmth, or a change of skin texture.  Blisters.  An open wound. How is this diagnosed? This condition is diagnosed with a medical history and physical exam. You may  also have tests, such as:  Blood tests.  Imaging tests.  Blood flow tests. Your pressure injury will be staged based on its severity. Staging is based on:  The depth of the tissue injury, including whether there is exposure of muscle, bone, or tendon.  The cause of the pressure injury. How is this treated? This condition may be treated by:  Relieving or redistributing pressure on your skin. This includes: ? Frequently changing your position. ? Avoiding positions that caused the wound or that can make the wound worse. ? Using specific bed mattresses, chair cushions, or protective boots. ? Moving medical devices from an area of pressure, or placing padding between the skin and the device. ? Using foams, creams, or powders to prevent rubbing (friction) on the skin.  Keeping your skin clean and dry. This may include using a skin cleanser or skin barrier as told by your health care provider.  Cleaning your injury and removing any dead tissue from the wound (debridement).  Placing a bandage (dressing) over your injury.  Using medicines for pain or to prevent or treat infection. Surgery may be needed if other treatments are not working or if your injury is very deep. Follow these instructions at home: Wound care  Follow instructions from your health care provider about how to take care of your wound. Make sure you: ? Wash your hands with soap and water before and after you change your bandage (dressing). If soap and water are not available, use hand  sanitizer. ? Change your dressing as told by your health care provider.  Check your wound every day for signs of infection. Have a caregiver do this for you if you are not able. Check for: ? Redness, swelling, or increased pain. ? More fluid or blood. ? Warmth. ? Pus or a bad smell. Skin care  Keep your skin clean and dry. Gently pat your skin dry.  Do not rub or massage your skin.  You or a caregiver should check your skin every  day for any changes in color or any new blisters or sores (ulcers). Medicines  Take over-the-counter and prescription medicines only as told by your health care provider.  If you were prescribed an antibiotic medicine, take or apply it as told by your health care provider. Do not stop using the antibiotic even if your condition improves. Reducing and redistributing pressure  Do not lie or sit in one position for a long time. Move or change position every 1-2 hours, or as told by your health care provider.  Use pillows or cushions to reduce pressure. Ask your health care provider to recommend cushions or pads for you. General instructions   Eat a healthy diet that includes lots of protein.  Drink enough fluid to keep your urine pale yellow.  Be as active as you can every day. Ask your health care provider to suggest safe exercises or activities.  Do not abuse drugs or alcohol.  Do not use any products that contain nicotine or tobacco, such as cigarettes, e-cigarettes, and chewing tobacco. If you need help quitting, ask your health care provider.  Keep all follow-up visits as told by your health care provider. This is important. Contact a health care provider if:  You have: ? A fever or chills. ? Pain that is not helped by medicine. ? Any changes in skin color. ? New blisters or sores. ? Pus or a bad smell coming from your wound. ? Redness, swelling, or pain around your wound. ? More fluid or blood coming from your wound.  Your wound does not improve after 1-2 weeks of treatment. Summary  A pressure injury is damage to the skin and underlying tissue that results from pressure being applied to an area of the body.  Do not lie or sit in one position for a long time. Your health care provider may advise you to move or change position every 1-2 hours.  Follow instructions from your health care provider about how to take care of your wound.  Keep all follow-up visits as told by  your health care provider. This is important. This information is not intended to replace advice given to you by your health care provider. Make sure you discuss any questions you have with your health care provider. Document Revised: 07/01/2018 Document Reviewed: 07/01/2018 Elsevier Patient Education  2020 [employer].

## 2020-06-10 NOTE — Progress Notes (Signed)
**Note Misty-Identified via Obfuscation** Subjective: Misty Gray is a pleasant 69 y.o. female patient seen today painful corn(s) left foot and callus(es) b/l feet and painful mycotic nails b/l.  Pain interferes with ambulation. Aggravating factors include wearing enclosed shoe gear.  Past Medical History:  Diagnosis Date  . Abnormal CT of the head 12/16/1984   R parietal atrophy  . Allergic rhinitis, cause unspecified   . Altered mental status 11/28/2016  . Anemia   . Arthritis    "hands and knees" (09/06/2015)  . Atrial fibrillation (HCC)   . Cellulitis 09/07/2015  . Cellulitis and abscess of leg 09/2016   bilateral  . CHF (congestive heart failure) (HCC)   . Diaphragmatic hernia without mention of obstruction or gangrene   . Dyspnea   . Exertional dyspnea 06/22/12  . Family history of adverse reaction to anesthesia    "daughter fights w/them; just can't relax during OR; stay awake during the OR"  . Fatigue 06/22/2012  . Female stress incontinence   . GERD (gastroesophageal reflux disease)   . Grand mal seizure (HCC)   . H/O hiatal hernia    two removed  . Heart murmur   . High cholesterol   . History of blood transfusion 1956   "related to nose bleed"  . Hypertension   . Influenza A 01/11/2014  . Lichenification and lichen simplex chronicus   . Migraine    "when I was a teenager"  . Morbid obesity (HCC)   . Nonischemic cardiomyopathy (HCC)    EF has normalized-repeat Pending   . On home oxygen therapy    "2L when I'm asleep in bed" (09/06/2015)  . OSA on CPAP   . Pacemaker  st judes    Patient states "this is my third pacemaker"  . Pneumonia 2004; 2011; 11/2011  . Seizures (HCC) since 1981   "can hear you talking but sounds like you are in big tunnel; have them often if not taking RX; last one was 06/17/12" (09/06/2015)  . Shortness of breath 10/24/2010   Qualifier: Diagnosis of  By: [place], Bonnie    . Sick sinus syndrome (HCC)    WITH PRIOR DDD PACEMAKER IMPLANTATION  . Somnolence   . Stroke [facility]) 1988   "mouth  drawed real bad on my left side; found out it was a seizure"  . Syncope and collapse 06/22/12   "hit forehead and left knee"; denies loss of consciousness  . Unspecified venous (peripheral) insufficiency     Patient Active Problem List   Diagnosis Date Noted  . Hospital discharge follow-up 11/17/2019  . Onychomycosis 11/17/2019  . Sepsis due to pneumonia (HCC) 11/08/2019  . Abdominal wall hernia 11/05/2017  . Anticoagulated 11/05/2017  . Rash and nonspecific skin eruption 09/15/2017  . Ventral hernia without obstruction or gangrene 08/15/2017  . Skin irritation 07/23/2017  . Chronic venous stasis dermatitis 12/26/2016  . Urinary tract infection without hematuria   . Frequent PVCs   . Hypokalemia   . Housing problems 11/23/2016  . Chronic atrial fibrillation (HCC) 11/01/2016  . Hypotension 10/31/2016  . Special screening for malignant neoplasms, colon 08/12/2016  . Heme positive stool 08/09/2016  . Intertrigo 08/09/2016  . Chronic anticoagulation 04/26/2016  . Chronic venous insufficiency   . Preventative health care 10/05/2014  . Breast mass, left 10/05/2014  . Osteopenia 09/15/2014  . Hypertension 01/04/2014  . Chronic systolic dysfunction of left ventricle 05/05/2013  . At high risk for falls 02/11/2013  . Vitamin D deficiency 07/15/2012  . Sinoatrial node dysfunction (  [place]) 05/08/2011  . Pacemaker 08/10/2009  . Allergic rhinitis 05/31/2008  . Morbid obesity ([place]) 02/12/2007  . GASTROESOPHAGEAL REFLUX, NO ESOPHAGITIS 02/12/2007  . Female stress incontinence 02/12/2007  . Seizure disorder ([place]) 02/12/2007  . OSA (obstructive sleep apnea) 02/12/2007    Current Outpatient Medications on File Prior to Visit  Medication Sig Dispense Refill  . amiodarone (PACERONE) 200 MG tablet Take 0.5 tablets (100 mg total) by mouth daily. (Patient taking differently: Take 200 mg by mouth daily. ) 45 tablet 3  . cholecalciferol (VITAMIN D) 25 MCG (1000 UNIT) tablet TAKE 1 TABLET BY MOUTH  ONCE DAILY. 28 tablet 11  . edoxaban (SAVAYSA) 60 MG TABS tablet Take 60 mg by mouth daily. 30 tablet 11  . fluticasone (FLONASE) 50 MCG/ACT nasal spray Place 2 sprays into both nostrils daily as needed. For congestion. 16 g 1  . omeprazole (PRILOSEC) 20 MG capsule TAKE (1) CAPSULE BY MOUTH ONCE DAILY. (Patient taking differently: Take 20 mg by mouth daily. ) 28 capsule 11  . PHENobarbital (LUMINAL) 64.8 MG tablet TAKE 1 TABLET BY MOUTH TWICE DAILY 56 tablet 3  . potassium chloride (K-DUR) 10 MEQ tablet TAKE 4 TABLETS BY MOUTH THREE TIMES DAILY (MORNING, NOON, AND NIGHT) (Patient taking differently: Take 40 mEq by mouth 3 (three) times daily. ) 336 tablet 11  . torsemide (DEMADEX) 20 MG tablet TAKE (2) TABLETS BY MOUTH TWICE DAILY. (Patient taking differently: Take 40 mg by mouth 2 (two) times daily. ) 112 tablet 11  . triamcinolone cream (KENALOG) 0.5 % Apply topically 3 (three) times daily. 30 g 0   No current facility-administered medications on file prior to visit.    Allergies  Allergen Reactions  . Aspirin Hives and Rash  . Penicillins Rash and Other (See Comments)    Has patient had a PCN reaction causing immediate rash, facial/tongue/throat swelling, SOB or lightheadedness with hypotension: Yes Has patient had a PCN reaction causing severe rash involving mucus membranes or skin necrosis: No Has patient had a PCN reaction that required hospitalization No Has patient had a PCN reaction occurring within the last 10 years: Yes If all of the above answers are "NO", then may proceed with Cephalosporin use.  Patient has tolerated cephalosporins in 2017.  . Tape Other (See Comments)    NO PAPER TAPE-MUST BE MEDICAL TAPE - unknown reaction NO PAPER TAPE-MUST BE MEDICAL TAPE - unknown reaction  . Wool Alcohol [Lanolin] Hives  . Amoxicillin Rash    Patient has tolerated cephalosporins  . Ampicillin Itching and Rash    Patient has tolerated cephalosporins    Objective: Physical  Exam  General: Misty Gray is a pleasant 69 y.o. Caucasian female, in NAD. AAO x 3.   Vascular:  Neurovascular status unchanged b/l lower extremities. Capillary refill time to digits immediate b/l. Faintly palpable pedal pulses b/l. Pedal hair absent. Lower extremity skin temperature gradient warm to cool. +1 pitting edema b/l lower extremities. Varicosities present b/l.  Dermatological:  Pedal skin is thin shiny, atrophic b/l lower extremities, L 3rd toe and R hallux. No open wounds bilaterally. No interdigital macerations bilaterally. Toenails 1-5 b/l elongated, discolored, dystrophic, thickened, crumbly with subungual debris and tenderness to dorsal palpation. Hyperkeratotic lesion(s) L hallux, L 3rd toe and R hallux.  No erythema, no edema, no drainage, no flocculence. Porokeratotic lesion(s) submet head 1 right foot and submet head 3 left foot. No erythema, no edema, no drainage, no flocculence.  Musculoskeletal:  Normal muscle strength 5/5 to all  lower extremity muscle groups bilaterally. No pain crepitus or joint limitation noted with ROM b/l. Hammertoes noted to the L 2nd toe and R 2nd toe.  Neurological:  Protective sensation decreased with 10 gram monofilament b/l. Vibratory sensation decreased b/l. Proprioception intact bilaterally. Clonus negative b/l.  Assessment and Plan:  1. Pain due to onychomycosis of toenails of both feet   2. Corns and callosities   3. Porokeratosis   4. PVD (peripheral vascular disease) (HCC)    -Examined patient. -No new findings. No new orders. -Toenails 1-5 b/l were debrided in length and girth with sterile nail nippers and dremel without iatrogenic bleeding.  -Corn(s) L 3rd toe and callus(es) L hallux and R hallux were pared utilizing sterile scalpel blade without incident. Total number debrided =3. -Painful porokeratotic lesion(s) submet head 1 right foot and submet head 3 left foot pared and enucleated with sterile scalpel blade without  incident. -Patient to report any pedal injuries to medical professional immediately. -Patient to continue soft, supportive shoe gear daily. -Patient/POA to call should there be question/concern in the interim.  Return in about 3 months (around 09/02/2020) for nail trim.  Freddie Breech, DPM

## 2020-06-15 ENCOUNTER — Ambulatory Visit: Payer: Medicare HMO | Admitting: Orthotics

## 2020-06-15 DIAGNOSIS — M79674 Pain in right toe(s): Secondary | ICD-10-CM

## 2020-06-15 DIAGNOSIS — R0902 Hypoxemia: Secondary | ICD-10-CM | POA: Diagnosis not present

## 2020-06-15 DIAGNOSIS — I739 Peripheral vascular disease, unspecified: Secondary | ICD-10-CM

## 2020-06-15 DIAGNOSIS — L84 Corns and callosities: Secondary | ICD-10-CM

## 2020-06-15 DIAGNOSIS — B351 Tinea unguium: Secondary | ICD-10-CM

## 2020-06-15 DIAGNOSIS — Q828 Other specified congenital malformations of skin: Secondary | ICD-10-CM

## 2020-06-15 DIAGNOSIS — G4733 Obstructive sleep apnea (adult) (pediatric): Secondary | ICD-10-CM | POA: Diagnosis not present

## 2020-06-15 NOTE — Progress Notes (Signed)

## 2020-06-20 NOTE — Progress Notes (Signed)
    SUBJECTIVE:   CHIEF COMPLAINT / HPI: Cellulitis in the legs  Lower extremity edema Patient reports bilateral lower extremity edema that started about 2 weeks ago.  Thought it was a rash and applied Betadine without any relief.  Reports burning sensation in legs left greater than right.  Denies any fevers, worsening shortness of breath (currently on home oxygen 3 to 4 L), chest pain.  Reports having some drainage from left open area.  She reports having similar symptoms last year and was hospitalized for cellulitis x2 weeks.    Patient is also requesting letter from PCP to opt out of jury duty due to medical disability.  PERTINENT  PMH / PSH:  Chronic venous stasis dermatitis Chronic venous insufficiency OBJECTIVE:   BP 102/60   Pulse 68   Ht 4\' 9"  (1.448 m)   Wt 209 lb 6.4 oz (95 kg)   SpO2 97%   BMI 45.31 kg/m    General: 69 year old female in no acute distress Cardiovascular: Regular rate and rhythm, no murmurs or gallops appreciated. Respiratory: Chest clear to auscultation bilaterally, diminished at bases, no increased work of breathing at rest Extremities: Bilateral lower extremities edematous and erythematous without warmth.  Pulses palpable.  Chronic venous stasis changes.  See media for photos.  ASSESSMENT/PLAN:   Chronic venous stasis dermatitis Less likely DVT given bilateral edema and chronicity.  Considered cellulitis but given no history of fevers and no obvious wounds lower on differential.  Edema and skin changes likely secondary to venous stasis dermatitis. -Increase torsemide to 80 mg daily for 2 days only -Will speak with PCP regarding letter for jury summons.   -Follow-up with PCP as needed       64, MD [facility]

## 2020-06-20 NOTE — Patient Instructions (Addendum)
Thank you for coming to see me today. It was a pleasure.   Increase your Torsemide to 80 mg daily.  4 tablets in the morning and 4 tablets in the afternoon.  FOR 2 DAYS ONLY  Please follow-up with PCP in 2 weeks  If you have any questions or concerns, please do not hesitate to call the office at [phone].  Best,   Dana Allan, MD [facility]    Chronic Venous Insufficiency Chronic venous insufficiency is a condition where the leg veins cannot effectively pump blood from the legs to the heart. This happens when the vein walls are either stretched, weakened, or damaged, or when the valves inside the vein are damaged. With the right treatment, you should be able to continue with an active life. This condition is also called venous stasis. What are the causes? Common causes of this condition include:  High blood pressure inside the veins (venous hypertension).  Sitting or standing too long, causing increased blood pressure in the leg veins.  A blood clot that blocks blood flow in a vein (deep vein thrombosis, DVT).  Inflammation of a vein (phlebitis) that causes a blood clot to form.  Tumors in the pelvis that cause blood to back up. What increases the risk? The following factors may make you more likely to develop this condition:  Having a family history of this condition.  Obesity.  Pregnancy.  Living without enough regular physical activity or exercise (sedentary lifestyle).  Smoking.  Having a job that requires long periods of standing or sitting in one place.  Being a certain age. Women in their 76s and 24s and men in their 32s are more likely to develop this condition. What are the signs or symptoms? Symptoms of this condition include:  Veins that are enlarged, bulging, or twisted (varicose veins).  Skin breakdown or ulcers.  Reddened skin or dark discoloration of skin on the leg between the knee and ankle.  Brown, smooth, tight, and painful  skin just above the ankle, usually on the inside of the leg (lipodermatosclerosis).  Swelling of the legs. How is this diagnosed? This condition may be diagnosed based on:  Your medical history.  A physical exam.  Tests, such as: ? A procedure that creates an image of a blood vessel and nearby organs and provides information about blood flow through the blood vessel (duplex ultrasound). ? A procedure that tests blood flow (plethysmography). ? A procedure that looks at the veins using X-ray and dye (venogram). How is this treated? The goals of treatment are to help you return to an active life and to minimize pain or disability. Treatment depends on the severity of your condition, and it may include:  Wearing compression stockings. These can help relieve symptoms and help prevent your condition from getting worse. However, they do not cure the condition.  Sclerotherapy. This procedure involves an injection of a solution that shrinks damaged veins.  Surgery. This may involve: ? Removing a diseased vein (vein stripping). ? Cutting off blood flow through the vein (laser ablation surgery). ? Repairing or reconstructing a valve within the affected vein. Follow these instructions at home:      Wear compression stockings as told by your health care provider. These stockings help to prevent blood clots and reduce swelling in your legs.  Take over-the-counter and prescription medicines only as told by your health care provider.  Stay active by exercising, walking, or doing different activities. Ask your health care provider what  activities are safe for you and how much exercise you need.  Drink enough fluid to keep your urine pale yellow.  Do not use any products that contain nicotine or tobacco, such as cigarettes, e-cigarettes, and chewing tobacco. If you need help quitting, ask your health care provider.  Keep all follow-up visits as told by your health care provider. This is  important. Contact a health care provider if you:  Have redness, swelling, or more pain in the affected area.  See a red streak or line that goes up or down from the affected area.  Have skin breakdown or skin loss in the affected area, even if the breakdown is small.  Get an injury in the affected area. Get help right away if:  You get an injury and an open wound in the affected area.  You have: ? Severe pain that does not get better with medicine. ? Sudden numbness or weakness in the foot or ankle below the affected area. ? Trouble moving your foot or ankle. ? A fever. ? Worse or persistent symptoms. ? Chest pain. ? Shortness of breath. Summary  Chronic venous insufficiency is a condition where the leg veins cannot effectively pump blood from the legs to the heart.  Chronic venous insufficiency occurs when the vein walls become stretched, weakened, or damaged, or when valves within the vein are damaged.  Treatment depends on how severe your condition is. It often involves wearing compression stockings and may involve having a procedure.  Make sure you stay active by exercising, walking, or doing different activities. Ask your health care provider what activities are safe for you and how much exercise you need. This information is not intended to replace advice given to you by your health care provider. Make sure you discuss any questions you have with your health care provider. Document Revised: 08/25/2018 Document Reviewed: 08/25/2018 Elsevier Patient Education  2020 [employer].

## 2020-06-21 ENCOUNTER — Ambulatory Visit (INDEPENDENT_AMBULATORY_CARE_PROVIDER_SITE_OTHER): Payer: Medicare HMO | Admitting: Family Medicine

## 2020-06-21 ENCOUNTER — Other Ambulatory Visit: Payer: Self-pay

## 2020-06-21 DIAGNOSIS — I872 Venous insufficiency (chronic) (peripheral): Secondary | ICD-10-CM | POA: Diagnosis not present

## 2020-06-24 NOTE — Assessment & Plan Note (Signed)
Less likely DVT given bilateral edema and chronicity.  Considered cellulitis but given no history of fevers and no obvious wounds lower on differential.  Edema and skin changes likely secondary to venous stasis dermatitis. -Increase torsemide to 80 mg daily for 2 days only -Will speak with PCP regarding letter for jury summons.   -Follow-up with PCP as needed

## 2020-06-29 ENCOUNTER — Encounter: Payer: Self-pay | Admitting: Family Medicine

## 2020-06-29 ENCOUNTER — Telehealth: Payer: Self-pay | Admitting: Family Medicine

## 2020-06-29 NOTE — Telephone Encounter (Signed)
Letter for jury duty routed to front office for patient pick up.  Dana Allan, MD Family Medicine [facility]

## 2020-06-30 NOTE — Telephone Encounter (Signed)
LVM for pt to call office to inform her of below.Phynix Horton Zimmerman Rumple, CMA  

## 2020-06-30 NOTE — Telephone Encounter (Signed)
Information given

## 2020-07-11 ENCOUNTER — Other Ambulatory Visit: Payer: Self-pay | Admitting: Family Medicine

## 2020-07-12 ENCOUNTER — Emergency Department (HOSPITAL_COMMUNITY): Payer: Medicare HMO

## 2020-07-12 ENCOUNTER — Inpatient Hospital Stay (HOSPITAL_COMMUNITY)
Admission: EM | Admit: 2020-07-12 | Discharge: 2020-07-18 | DRG: 292 | Disposition: A | Discharge status: Home Health | Payer: Medicare HMO | Attending: Family Medicine | Admitting: Family Medicine

## 2020-07-12 ENCOUNTER — Other Ambulatory Visit: Payer: Self-pay

## 2020-07-12 ENCOUNTER — Encounter (HOSPITAL_COMMUNITY): Payer: Self-pay | Admitting: Pediatrics

## 2020-07-12 DIAGNOSIS — Z452 Encounter for adjustment and management of vascular access device: Secondary | ICD-10-CM

## 2020-07-12 DIAGNOSIS — I482 Chronic atrial fibrillation, unspecified: Secondary | ICD-10-CM | POA: Diagnosis not present

## 2020-07-12 DIAGNOSIS — Z20822 Contact with and (suspected) exposure to covid-19: Secondary | ICD-10-CM | POA: Diagnosis not present

## 2020-07-12 DIAGNOSIS — R05 Cough: Secondary | ICD-10-CM | POA: Diagnosis present

## 2020-07-12 DIAGNOSIS — Z833 Family history of diabetes mellitus: Secondary | ICD-10-CM

## 2020-07-12 DIAGNOSIS — E876 Hypokalemia: Secondary | ICD-10-CM | POA: Diagnosis not present

## 2020-07-12 DIAGNOSIS — M1990 Unspecified osteoarthritis, unspecified site: Secondary | ICD-10-CM | POA: Diagnosis present

## 2020-07-12 DIAGNOSIS — Z8249 Family history of ischemic heart disease and other diseases of the circulatory system: Secondary | ICD-10-CM

## 2020-07-12 DIAGNOSIS — Z6841 Body Mass Index (BMI) 40.0 and over, adult: Secondary | ICD-10-CM

## 2020-07-12 DIAGNOSIS — G40409 Other generalized epilepsy and epileptic syndromes, not intractable, without status epilepticus: Secondary | ICD-10-CM | POA: Diagnosis present

## 2020-07-12 DIAGNOSIS — Z8673 Personal history of transient ischemic attack (TIA), and cerebral infarction without residual deficits: Secondary | ICD-10-CM | POA: Diagnosis not present

## 2020-07-12 DIAGNOSIS — Z79899 Other long term (current) drug therapy: Secondary | ICD-10-CM

## 2020-07-12 DIAGNOSIS — Z950 Presence of cardiac pacemaker: Secondary | ICD-10-CM

## 2020-07-12 DIAGNOSIS — Z823 Family history of stroke: Secondary | ICD-10-CM

## 2020-07-12 DIAGNOSIS — I083 Combined rheumatic disorders of mitral, aortic and tricuspid valves: Secondary | ICD-10-CM | POA: Diagnosis present

## 2020-07-12 DIAGNOSIS — T502X5A Adverse effect of carbonic-anhydrase inhibitors, benzothiadiazides and other diuretics, initial encounter: Secondary | ICD-10-CM | POA: Diagnosis not present

## 2020-07-12 DIAGNOSIS — L03115 Cellulitis of right lower limb: Secondary | ICD-10-CM | POA: Diagnosis not present

## 2020-07-12 DIAGNOSIS — E559 Vitamin D deficiency, unspecified: Secondary | ICD-10-CM | POA: Diagnosis present

## 2020-07-12 DIAGNOSIS — D696 Thrombocytopenia, unspecified: Secondary | ICD-10-CM | POA: Diagnosis present

## 2020-07-12 DIAGNOSIS — I5023 Acute on chronic systolic (congestive) heart failure: Secondary | ICD-10-CM | POA: Diagnosis not present

## 2020-07-12 DIAGNOSIS — R0602 Shortness of breath: Secondary | ICD-10-CM | POA: Diagnosis not present

## 2020-07-12 DIAGNOSIS — Z8701 Personal history of pneumonia (recurrent): Secondary | ICD-10-CM | POA: Diagnosis not present

## 2020-07-12 DIAGNOSIS — G4733 Obstructive sleep apnea (adult) (pediatric): Secondary | ICD-10-CM | POA: Diagnosis not present

## 2020-07-12 DIAGNOSIS — I428 Other cardiomyopathies: Secondary | ICD-10-CM | POA: Diagnosis present

## 2020-07-12 DIAGNOSIS — I519 Heart disease, unspecified: Secondary | ICD-10-CM

## 2020-07-12 DIAGNOSIS — Z87891 Personal history of nicotine dependence: Secondary | ICD-10-CM

## 2020-07-12 DIAGNOSIS — K219 Gastro-esophageal reflux disease without esophagitis: Secondary | ICD-10-CM | POA: Diagnosis present

## 2020-07-12 DIAGNOSIS — Z91048 Other nonmedicinal substance allergy status: Secondary | ICD-10-CM

## 2020-07-12 DIAGNOSIS — J90 Pleural effusion, not elsewhere classified: Secondary | ICD-10-CM | POA: Diagnosis not present

## 2020-07-12 DIAGNOSIS — L03116 Cellulitis of left lower limb: Secondary | ICD-10-CM | POA: Diagnosis present

## 2020-07-12 DIAGNOSIS — I110 Hypertensive heart disease with heart failure: Principal | ICD-10-CM | POA: Diagnosis present

## 2020-07-12 DIAGNOSIS — I5021 Acute systolic (congestive) heart failure: Secondary | ICD-10-CM | POA: Diagnosis not present

## 2020-07-12 DIAGNOSIS — N179 Acute kidney failure, unspecified: Secondary | ICD-10-CM | POA: Diagnosis not present

## 2020-07-12 DIAGNOSIS — I959 Hypotension, unspecified: Secondary | ICD-10-CM | POA: Diagnosis present

## 2020-07-12 DIAGNOSIS — I872 Venous insufficiency (chronic) (peripheral): Secondary | ICD-10-CM | POA: Diagnosis present

## 2020-07-12 DIAGNOSIS — Z9981 Dependence on supplemental oxygen: Secondary | ICD-10-CM

## 2020-07-12 DIAGNOSIS — R062 Wheezing: Secondary | ICD-10-CM | POA: Diagnosis not present

## 2020-07-12 DIAGNOSIS — R092 Respiratory arrest: Secondary | ICD-10-CM | POA: Diagnosis not present

## 2020-07-12 DIAGNOSIS — E785 Hyperlipidemia, unspecified: Secondary | ICD-10-CM | POA: Diagnosis present

## 2020-07-12 DIAGNOSIS — Z8049 Family history of malignant neoplasm of other genital organs: Secondary | ICD-10-CM

## 2020-07-12 DIAGNOSIS — Z9109 Other allergy status, other than to drugs and biological substances: Secondary | ICD-10-CM

## 2020-07-12 DIAGNOSIS — Z8619 Personal history of other infectious and parasitic diseases: Secondary | ICD-10-CM

## 2020-07-12 DIAGNOSIS — I5043 Acute on chronic combined systolic (congestive) and diastolic (congestive) heart failure: Secondary | ICD-10-CM | POA: Diagnosis not present

## 2020-07-12 DIAGNOSIS — I495 Sick sinus syndrome: Secondary | ICD-10-CM | POA: Diagnosis present

## 2020-07-12 DIAGNOSIS — Z880 Allergy status to penicillin: Secondary | ICD-10-CM

## 2020-07-12 DIAGNOSIS — J811 Chronic pulmonary edema: Secondary | ICD-10-CM | POA: Diagnosis not present

## 2020-07-12 DIAGNOSIS — R0902 Hypoxemia: Secondary | ICD-10-CM | POA: Diagnosis not present

## 2020-07-12 DIAGNOSIS — I517 Cardiomegaly: Secondary | ICD-10-CM | POA: Diagnosis not present

## 2020-07-12 DIAGNOSIS — I279 Pulmonary heart disease, unspecified: Secondary | ICD-10-CM | POA: Diagnosis not present

## 2020-07-12 DIAGNOSIS — I509 Heart failure, unspecified: Secondary | ICD-10-CM

## 2020-07-12 DIAGNOSIS — Z886 Allergy status to analgesic agent status: Secondary | ICD-10-CM

## 2020-07-12 LAB — SARS CORONAVIRUS 2 BY RT PCR (HOSPITAL ORDER, PERFORMED IN ~~LOC~~ HOSPITAL LAB): SARS Coronavirus 2: NEGATIVE

## 2020-07-12 LAB — CBC WITH DIFFERENTIAL/PLATELET
Abs Immature Granulocytes: 0.01 10*3/uL (ref 0.00–0.07)
Basophils Absolute: 0 10*3/uL (ref 0.0–0.1)
Basophils Relative: 1 %
Eosinophils Absolute: 0 10*3/uL (ref 0.0–0.5)
Eosinophils Relative: 1 %
HCT: 39.3 % (ref 36.0–46.0)
Hemoglobin: 12.4 g/dL (ref 12.0–15.0)
Immature Granulocytes: 0 %
Lymphocytes Relative: 22 %
Lymphs Abs: 0.9 10*3/uL (ref 0.7–4.0)
MCH: 29.3 pg (ref 26.0–34.0)
MCHC: 31.6 g/dL (ref 30.0–36.0)
MCV: 92.9 fL (ref 80.0–100.0)
Monocytes Absolute: 0.5 10*3/uL (ref 0.1–1.0)
Monocytes Relative: 12 %
Neutro Abs: 2.8 10*3/uL (ref 1.7–7.7)
Neutrophils Relative %: 64 %
Platelets: 108 10*3/uL — ABNORMAL LOW (ref 150–400)
RBC: 4.23 MIL/uL (ref 3.87–5.11)
RDW: 15.7 % — ABNORMAL HIGH (ref 11.5–15.5)
WBC: 4.3 10*3/uL (ref 4.0–10.5)
nRBC: 0 % (ref 0.0–0.2)

## 2020-07-12 LAB — COMPREHENSIVE METABOLIC PANEL
ALT: 12 U/L (ref 0–44)
AST: 21 U/L (ref 15–41)
Albumin: 3.1 g/dL — ABNORMAL LOW (ref 3.5–5.0)
Alkaline Phosphatase: 129 U/L — ABNORMAL HIGH (ref 38–126)
Anion gap: 13 (ref 5–15)
BUN: 17 mg/dL (ref 8–23)
CO2: 21 mmol/L — ABNORMAL LOW (ref 22–32)
Calcium: 9.5 mg/dL (ref 8.9–10.3)
Chloride: 104 mmol/L (ref 98–111)
Creatinine, Ser: 0.99 mg/dL (ref 0.44–1.00)
GFR calc Af Amer: >60 mL/min (ref >60)
GFR calc non Af Amer: 58 mL/min — ABNORMAL LOW (ref >60)
Glucose, Bld: 114 mg/dL — ABNORMAL HIGH (ref 70–99)
Potassium: 4.2 mmol/L (ref 3.5–5.1)
Sodium: 138 mmol/L (ref 135–145)
Total Bilirubin: 1.3 mg/dL — ABNORMAL HIGH (ref 0.3–1.2)
Total Protein: 7.1 g/dL (ref 6.5–8.1)

## 2020-07-12 LAB — LACTIC ACID, PLASMA: Lactic Acid, Venous: 1.4 mmol/L (ref 0.5–1.9)

## 2020-07-12 LAB — BRAIN NATRIURETIC PEPTIDE: B Natriuretic Peptide: 1419.2 pg/mL — ABNORMAL HIGH (ref 0.0–100.0)

## 2020-07-12 MED ORDER — SODIUM CHLORIDE 0.9% FLUSH
3.0000 mL | INTRAVENOUS | Status: DC | PRN
  Administered 2020-07-17: 3 mL via INTRAVENOUS

## 2020-07-12 MED ORDER — SODIUM CHLORIDE 0.9% FLUSH
3.0000 mL | Freq: Two times a day (BID) | INTRAVENOUS | Status: DC
  Administered 2020-07-12 – 2020-07-17 (×6): 3 mL via INTRAVENOUS

## 2020-07-12 MED ORDER — FUROSEMIDE 10 MG/ML IJ SOLN
80.0000 mg | Freq: Once | INTRAMUSCULAR | Status: AC
  Administered 2020-07-12: 80 mg via INTRAVENOUS
  Filled 2020-07-12: qty 8

## 2020-07-12 MED ORDER — PANTOPRAZOLE SODIUM 40 MG PO TBEC
40.0000 mg | DELAYED_RELEASE_TABLET | Freq: Every day | ORAL | Status: DC
  Administered 2020-07-13 – 2020-07-18 (×6): 40 mg via ORAL
  Filled 2020-07-12 (×6): qty 1

## 2020-07-12 MED ORDER — EDOXABAN TOSYLATE 60 MG PO TABS
60.0000 mg | ORAL_TABLET | Freq: Every day | ORAL | Status: DC
  Filled 2020-07-12 (×2): qty 60

## 2020-07-12 MED ORDER — ACETAMINOPHEN 325 MG PO TABS
650.0000 mg | ORAL_TABLET | ORAL | Status: DC | PRN
  Administered 2020-07-13: 650 mg via ORAL
  Filled 2020-07-12: qty 2

## 2020-07-12 MED ORDER — PHENOBARBITAL 32.4 MG PO TABS
64.8000 mg | ORAL_TABLET | Freq: Two times a day (BID) | ORAL | Status: DC
  Administered 2020-07-12 – 2020-07-18 (×12): 64.8 mg via ORAL
  Filled 2020-07-12 (×12): qty 2

## 2020-07-12 MED ORDER — SODIUM CHLORIDE 0.9 % IV SOLN: 250.0000 mL | INTRAVENOUS | Status: DC | PRN

## 2020-07-12 MED ORDER — AMIODARONE HCL 100 MG PO TABS
100.0000 mg | ORAL_TABLET | Freq: Every day | ORAL | Status: DC
  Administered 2020-07-13 – 2020-07-18 (×6): 100 mg via ORAL
  Filled 2020-07-12 (×6): qty 1

## 2020-07-12 NOTE — H&P (Signed)
Family Medicine Teaching [place] Day Admission History and Physical Service Pager: [phone]  [name] Medical record number: [id number] Date of birth: 1951/04/25 Age: 69 y.o. Gender: female  Primary Care Provider: Latrelle Dodrill, MD Consultants: None Code Status: DNI Preferred Emergency Contact: Misty Gray, son, [phone]  Chief Complaint:  BLE edema, drainage, and DOE  Assessment and Plan: Misty Gray is a 69 y.o. female presenting with BLE edema with clear drainage and worsening DOE x 1 week. PMH is significant for HFrEF (EF 25-30%) 2/2 NICM, A Fib with Tachy-Brady Syndrome s/p PPM on Edoxaban, morbid obesity, OSA on CPAP, HTN, Hx Seizures.  Shortness of Breath 2/2 CHF Exaceration HFrEF (EF 25-30%) Presents with worsening SOB and DOE for the last week.  Also endorses orthopnea. Has only been able to walk across a room and then gets short of breath, usually can't walk very far, but states that she can tell that her breathing is worse.  States that she has been taking torsemide  BID as prescribed and pees well with this.  VSS on presentation to ED.  Patient did has desaturation to 84% with ambulation, but has otherwise been breathing comfortably with appropriate O2 sats on RA.  CXR showed increasing cardiomegaly, pleural effusions and pulmonary edema.  BNP elevated to 1419, of note, in November 2020, was 4306.  Last Echo 12/30/18 with EF 25-30%.  Doubt pneumonia as she has been afebrile, WBC 4.3, no new infectious symptoms.  Patient did not meet sepsis criteria, but did have LA drawn which was 1.4, and blood cultures.  Wells score 0 for PE and patient on anticoagulation, therefore very unlikely.  Most likely cause of worsening DOE is CHF exacerbation.  She was given IV Lasix  in ED, Cr currently at baseline.  Will see how patient does with this overnight and reassess for further diuresis in AM.   - Admit to inpatient, Dr. Donnetta Hail service - vitals per floor  protocol - cardiac monitoring - s/p IV Lasix , will assess diuresis in AM - consider cards consult in AM - order repeat Echo - strict I/o - daily weights - AM BMP/CBC - PT/OT recs - heart healthy diet - on edoxaban, no need for other DVT ppx - f/u blood cultures drawn in ED  Chronic Venous Stasis Dermatitis Patient initially presented for concern for possible worsening of BLE chronic venous stasis dermatitis with clear wound drainage.  No fevers.  Clear discharge on exam, no increased warmth to palpation.  See images below.  WBC 4.3.  No sign of infection.  Will treat as chronic wounds and have wound care while inpatient. - wound care consult - if worsening in appearance or WBC, would consider treatment for cellulitis  Atrial Fibrillation Currently on amiodarone  QD and edoxaban  QD.  EKG with A fib and 1 PVC.  Also has a history of Tachy-Brady syndrome and has a pacemaker in place.  Per last cardiology note on 01/12/2020, pt has followed with EP in the past and they do not feel she is good candidate for CRT upgrade to alleviate pacing. - cont home amiodarone, patient had stated that she was taking , but last cardfiology note says to decrease to , will need to clarify dose in AM - cont home edoxaban  Thrombocytopenia Plt 108 on admisison.  Appears chronic on chart review, going back as far as 2008.  Have been as low as 85 previously.  AST/ALT WNL today.  Consider outpatient heme referral,  peripheral smear, and further workup, as do not see where this has been done in the chart. - monitor CBC - consider outpatient heme referral  HTN On problem list, not on antihypertensives.  BP on admission 130/94.  Per cards note, patient failed low dose losartan due to hypotension and has been unable to tolerate BB in past. - cont to monitor BP  Hx Seizures Well controlled on phenobarbital 64.8 mg BID. - cont home phenobarbital  OSA On CPAP at home. - CPAP QHS  FEN/GI:  heart healthy Prophylaxis: Edoxaban  Disposition: admit to inpatient with further diuresis  History of Present Illness:  Misty Gray is a 69 y.o. female presenting with 1 week worsening shortness of breath and dyspnea on exertion and increased bilateral lower extremity edema.  Patient reports that she can usually walk short distances, including from 1 side of the room to the other without shortness of breath, but states over the last week she has noticed that she is now getting more short of breath than usual.  Also feels like she is holding onto more fluid, but states that her weight has not changed at home.  Endorses at least 2 pillow orthopnea as well.  No chest pain.  No fevers.  Has a chronic cough in AM that is dry.  Patient initially reported to ED at the request of her son after he noticed that her right leg seemed to not be draining clear fluid and he was worried for infection.  Patient has been taking care of this for the last 2 to 3 weeks.  States that she is been using creams that she has at home and covering it with Band-Aids which seems to be helping.  Denies any fevers, chills.  States that she has a tingling in the area, but otherwise has no pain.  States that she has noticed increase swelling in both of her legs, not just one.  She lives at home with her husband.  Walks with a walker or cane at home.  Review Of Systems: Per HPI with the following additions:   Review of Systems  Constitutional: Negative for appetite change, chills, fatigue, fever and unexpected weight change.  HENT: Positive for congestion. Negative for sore throat.   Eyes: Negative for visual disturbance.  Respiratory: Positive for cough (dry, chronic in AM) and shortness of breath.   Cardiovascular: Positive for leg swelling. Negative for chest pain.  Gastrointestinal: Negative for abdominal pain, blood in stool, constipation, diarrhea, nausea and vomiting.  Genitourinary: Negative for dysuria, frequency and  hematuria.  Musculoskeletal:       Chronic right leg pain and weakness after fall 2006  Skin: Positive for wound (right leg now with clear discharge).  Neurological: Negative for dizziness, syncope, weakness and headaches.     Patient Active Problem List   Diagnosis Date Noted  . CHF exacerbation (HCC) 07/12/2020  . Hospital discharge follow-up 11/17/2019  . Onychomycosis 11/17/2019  . Sepsis due to pneumonia (HCC) 11/08/2019  . Abdominal wall hernia 11/05/2017  . Anticoagulated 11/05/2017  . Rash and nonspecific skin eruption 09/15/2017  . Ventral hernia without obstruction or gangrene 08/15/2017  . Skin irritation 07/23/2017  . Chronic venous stasis dermatitis 12/26/2016  . Urinary tract infection without hematuria   . Frequent PVCs   . Hypokalemia   . Housing problems 11/23/2016  . Chronic atrial fibrillation (HCC) 11/01/2016  . Hypotension 10/31/2016  . Special screening for malignant neoplasms, colon 08/12/2016  . Heme positive stool 08/09/2016  .  Intertrigo 08/09/2016  . Chronic anticoagulation 04/26/2016  . Chronic venous insufficiency   . Preventative health care 10/05/2014  . Breast mass, left 10/05/2014  . Osteopenia 09/15/2014  . Hypertension 01/04/2014  . Chronic systolic dysfunction of left ventricle 05/05/2013  . At high risk for falls 02/11/2013  . Vitamin D deficiency 07/15/2012  . Sinoatrial node dysfunction (HCC) 05/08/2011  . Pacemaker 08/10/2009  . Allergic rhinitis 05/31/2008  . Morbid obesity (HCC) 02/12/2007  . GASTROESOPHAGEAL REFLUX, NO ESOPHAGITIS 02/12/2007  . Female stress incontinence 02/12/2007  . Seizure disorder (HCC) 02/12/2007  . OSA (obstructive sleep apnea) 02/12/2007    Past Medical History: Past Medical History:  Diagnosis Date  . Abnormal CT of the head 12/16/1984   R parietal atrophy  . Allergic rhinitis, cause unspecified   . Altered mental status 11/28/2016  . Anemia   . Arthritis    "hands and knees" (09/06/2015)  .  Atrial fibrillation (HCC)   . Cellulitis 09/07/2015  . Cellulitis and abscess of leg 09/2016   bilateral  . CHF (congestive heart failure) (HCC)   . Diaphragmatic hernia without mention of obstruction or gangrene   . Dyspnea   . Exertional dyspnea 06/22/12  . Family history of adverse reaction to anesthesia    "daughter fights w/them; just can't relax during OR; stay awake during the OR"  . Fatigue 06/22/2012  . Female stress incontinence   . GERD (gastroesophageal reflux disease)   . Grand mal seizure (HCC)   . H/O hiatal hernia    two removed  . Heart murmur   . High cholesterol   . History of blood transfusion 1956   "related to nose bleed"  . Hypertension   . Influenza A 01/11/2014  . Lichenification and lichen simplex chronicus   . Migraine    "when I was a teenager"  . Morbid obesity (HCC)   . Nonischemic cardiomyopathy (HCC)    EF has normalized-repeat Pending   . On home oxygen therapy    "2L when I'm asleep in bed" (09/06/2015)  . OSA on CPAP   . Pacemaker  st judes    Patient states "this is my third pacemaker"  . Pneumonia 2004; 2011; 11/2011  . Seizures (HCC) since 1981   "can hear you talking but sounds like you are in big tunnel; have them often if not taking RX; last one was 06/17/12" (09/06/2015)  . Shortness of breath 10/24/2010   Qualifier: Diagnosis of  By: [place], Bonnie    . Sick sinus syndrome (HCC)    WITH PRIOR DDD PACEMAKER IMPLANTATION  . Somnolence   . Stroke [facility]) 1988   "mouth drawed real bad on my left side; found out it was a seizure"  . Syncope and collapse 06/22/12   "hit forehead and left knee"; denies loss of consciousness  . Unspecified venous (peripheral) insufficiency     Past Surgical History: Past Surgical History:  Procedure Laterality Date  . APPENDECTOMY    . CARDIAC CATHETERIZATION N/A 10/03/2016   Procedure: Right/Left Heart Cath and Coronary Angiography;  Surgeon: Dolores Patty, MD;  Location: [facility] INVASIVE CV LAB;  Service:  Cardiovascular;  Laterality: N/A;  . HERNIA REPAIR  ? 1988; 04/15/1998  . INSERT / REPLACE / REMOVE PACEMAKER  12/16/1990   DDD EF 30%;  Marland Kitchen INSERT / REPLACE / REMOVE PACEMAKER  07/25/2003   replaced by BB, leads are both IS1 and from 1992  . INSERT / REPLACE / REMOVE PACEMAKER  05/21/2011  JA; generator change  . LEG SKIN LESION  BIOPSY / EXCISION  05/16/2001   Lichen Planus  . VENTRAL HERNIA REPAIR  1989; 04/15/1998    Social History: Social History   Tobacco Use  . Smoking status: Former Smoker    Packs/day: 1.00    Years: 7.00    Pack years: 7.00    Types: Cigarettes    Quit date: 03/16/1969    Years since quitting: 51.3  . Smokeless tobacco: Never Used  Substance Use Topics  . Alcohol use: No  . Drug use: No   Additional social history: None  Please also refer to relevant sections of EMR.  Family History: Family History  Problem Relation Age of Onset  . Stroke Father        died from CAD?  Marland Kitchen Heart disease Father   . Cancer Mother   . Diabetes Son        Type 1  . Sleep apnea Son   . Cancer Sister        cervical  . Hypertension Sister   . Hypertension Brother   . Rheum arthritis Daughter        Also PGM, PGGM    Allergies and Medications: Allergies  Allergen Reactions  . Aspirin Hives and Rash  . Penicillins Rash and Other (See Comments)    Has patient had a PCN reaction causing immediate rash, facial/tongue/throat swelling, SOB or lightheadedness with hypotension: Yes Has patient had a PCN reaction causing severe rash involving mucus membranes or skin necrosis: No Has patient had a PCN reaction that required hospitalization No Has patient had a PCN reaction occurring within the last 10 years: Yes If all of the above answers are "NO", then may proceed with Cephalosporin use.  Patient has tolerated cephalosporins in 2017.  . Tape Other (See Comments)    NO PAPER TAPE-MUST BE MEDICAL TAPE - unknown reaction NO PAPER TAPE-MUST BE MEDICAL TAPE - unknown reaction  .  Wool Alcohol [Lanolin] Hives  . Amoxicillin Rash    Patient has tolerated cephalosporins  . Ampicillin Itching and Rash    Patient has tolerated cephalosporins   No current facility-administered medications on file prior to encounter.   Current Outpatient Medications on File Prior to Encounter  Medication Sig Dispense Refill  . amiodarone (PACERONE) 200 MG tablet Take 0.5 tablets (100 mg total) by mouth daily. (Patient taking differently: Take 200 mg by mouth daily. ) 45 tablet 3  . cholecalciferol (VITAMIN D) 25 MCG (1000 UNIT) tablet TAKE 1 TABLET BY MOUTH ONCE DAILY. (Patient taking differently: Take 1,000 Units by mouth daily. ) 28 tablet 11  . edoxaban (SAVAYSA) 60 MG TABS tablet Take 60 mg by mouth daily. 30 tablet 11  . fluticasone (FLONASE) 50 MCG/ACT nasal spray Place 2 sprays into both nostrils daily as needed. For congestion. 16 g 1  . omeprazole (PRILOSEC) 20 MG capsule TAKE (1) CAPSULE BY MOUTH ONCE DAILY. (Patient taking differently: Take 20 mg by mouth daily. ) 28 capsule 11  . PHENobarbital (LUMINAL) 64.8 MG tablet TAKE 1 TABLET BY MOUTH TWICE DAILY (Patient taking differently: Take 64.8 mg by mouth 2 (two) times daily. ) 56 tablet 3  . potassium chloride (K-DUR) 10 MEQ tablet TAKE 4 TABLETS BY MOUTH THREE TIMES DAILY (MORNING, NOON, AND NIGHT) (Patient taking differently: Take 40 mEq by mouth 3 (three) times daily. ) 336 tablet 11  . torsemide (DEMADEX) 20 MG tablet TAKE (2) TABLETS BY MOUTH TWICE DAILY. (Patient taking differently:  Take 40 mg by mouth 2 (two) times daily. ) 112 tablet 11  . triamcinolone cream (KENALOG) 0.5 % Apply topically 3 (three) times daily. 30 g 0    Objective: BP (!) 130/94 (BP Location: Right Arm)   Pulse 84   Temp 97.8 F (36.6 C) (Oral)   Resp 21   Ht 4\' 9"  (1.448 m)   Wt 90.7 kg   SpO2 (!) 89%   BMI 43.28 kg/m   Physical Exam:  General: 69 y.o. female in NAD HEENT: NCAT, EOMI, MMM Cardio: irregularly irregular, 2/6 systolic ejection  murmur heard best at RUSB Lungs: decreased bibasilar lung sounds, no wheezing noted, no increased WOB on RA Abdomen: Soft, non-tender to palpation, non-distended, , midline scar noted, positive bowel sounds Skin: warm and dry, BLE with erythema and scaling consistent with chronic venous stasis dermatitis. No increased warmth to palpation.  Right lower leg on lateral aspect with 1 cm bulla filled with clear fluid, 1.5 cm excoriation, no draining fluid, see images below Extremities: 1+ BLE pitting edema to knees Neuro: A&Ox4, grossly intact        Labs and Imaging: CBC BMET  Recent Labs  Lab 07/12/20 1201  WBC 4.3  HGB 12.4  HCT 39.3  PLT 108*   Recent Labs  Lab 07/12/20 1201  NA 138  K 4.2  CL 104  CO2 21*  BUN 17  CREATININE 0.99  GLUCOSE 114*  CALCIUM 9.5     EKG: A fib with PVC, unchanged  DG Chest Port 1 View  Result Date: 07/12/2020 CLINICAL DATA:  Shortness of breath. EXAM: PORTABLE CHEST 1 VIEW COMPARISON:  11/08/2019 FINDINGS: Right-sided pacemaker remains in place. Cardiomegaly heads slightly increased from prior exam. Unchanged mediastinal contours. Increase in bilateral pleural effusions. Increase in interstitial opacities consistent with pulmonary edema. No pneumothorax. No acute osseous abnormalities are seen. IMPRESSION: CHF with increasing cardiomegaly, pleural effusions, and pulmonary edema. Electronically Signed   By: 11/10/2019 M.D.   On: 07/12/2020 19:36     Brilynn Biasi, 07/14/2020, DO 07/12/2020, 11:00 PM PGY-3, [facility] FPTS Intern pager: [phone], text pages welcome

## 2020-07-12 NOTE — ED Provider Notes (Signed)
[facility] EMERGENCY DEPARTMENT Provider Note   CSN: [id number] Arrival date & time: 07/12/20  1136     History Chief Complaint  Patient presents with  . Cellulitis    Misty Gray is a 69 y.o. female.  Misty Gray is a 69 y.o. female with a history of A. fib, CHF, cellulitis, chronic venous insufficiency, hypertension, hyperlipidemia, sleep apnea, who presents to the emergency department for evaluation of worsening cellulitis and shortness of breath.  Patient states that she has had cellulitis of her lower extremities for many months, she has noticed some weeping and clear drainage from these over the past few weeks that she felt like has gotten a bit worse.  Redness has not spread at all.  No fevers or chills.  Does report worsening shortness of breath over the past week, primarily with exertion.  States that she feels like she cannot get across the room in her home anymore.  No associated chest pain.  She does feel like the swelling in her legs have worsened recently.  She states that she recently had her Lasix increased to 80 in the morning and 40 at night, but despite this she is continued to have worsening symptoms.         Past Medical History:  Diagnosis Date  . Abnormal CT of the head 12/16/1984   R parietal atrophy  . Allergic rhinitis, cause unspecified   . Altered mental status 11/28/2016  . Anemia   . Arthritis    "hands and knees" (09/06/2015)  . Atrial fibrillation (HCC)   . Cellulitis 09/07/2015  . Cellulitis and abscess of leg 09/2016   bilateral  . CHF (congestive heart failure) (HCC)   . Diaphragmatic hernia without mention of obstruction or gangrene   . Dyspnea   . Exertional dyspnea 06/22/12  . Family history of adverse reaction to anesthesia    "daughter fights w/them; just can't relax during OR; stay awake during the OR"  . Fatigue 06/22/2012  . Female stress incontinence   . GERD (gastroesophageal reflux disease)   . Grand mal seizure  (HCC)   . H/O hiatal hernia    two removed  . Heart murmur   . High cholesterol   . History of blood transfusion 1956   "related to nose bleed"  . Hypertension   . Influenza A 01/11/2014  . Lichenification and lichen simplex chronicus   . Migraine    "when I was a teenager"  . Morbid obesity (HCC)   . Nonischemic cardiomyopathy (HCC)    EF has normalized-repeat Pending   . On home oxygen therapy    "2L when I'm asleep in bed" (09/06/2015)  . OSA on CPAP   . Pacemaker  st judes    Patient states "this is my third pacemaker"  . Pneumonia 2004; 2011; 11/2011  . Seizures (HCC) since 1981   "can hear you talking but sounds like you are in big tunnel; have them often if not taking RX; last one was 06/17/12" (09/06/2015)  . Shortness of breath 10/24/2010   Qualifier: Diagnosis of  By: [place], Bonnie    . Sick sinus syndrome (HCC)    WITH PRIOR DDD PACEMAKER IMPLANTATION  . Somnolence   . Stroke [facility]) 1988   "mouth drawed real bad on my left side; found out it was a seizure"  . Syncope and collapse 06/22/12   "hit forehead and left knee"; denies loss of consciousness  . Unspecified venous (peripheral) insufficiency  Patient Active Problem List   Diagnosis Date Noted  . CHF exacerbation (HCC) 07/12/2020  . Hospital discharge follow-up 11/17/2019  . Onychomycosis 11/17/2019  . Sepsis due to pneumonia (HCC) 11/08/2019  . Abdominal wall hernia 11/05/2017  . Anticoagulated 11/05/2017  . Rash and nonspecific skin eruption 09/15/2017  . Ventral hernia without obstruction or gangrene 08/15/2017  . Skin irritation 07/23/2017  . Chronic venous stasis dermatitis 12/26/2016  . Urinary tract infection without hematuria   . Frequent PVCs   . Hypokalemia   . Housing problems 11/23/2016  . Chronic atrial fibrillation (HCC) 11/01/2016  . Hypotension 10/31/2016  . Special screening for malignant neoplasms, colon 08/12/2016  . Heme positive stool 08/09/2016  . Intertrigo 08/09/2016  . Chronic  anticoagulation 04/26/2016  . Chronic venous insufficiency   . Preventative health care 10/05/2014  . Breast mass, left 10/05/2014  . Osteopenia 09/15/2014  . Hypertension 01/04/2014  . Chronic systolic dysfunction of left ventricle 05/05/2013  . At high risk for falls 02/11/2013  . Vitamin D deficiency 07/15/2012  . Sinoatrial node dysfunction (HCC) 05/08/2011  . Pacemaker 08/10/2009  . Allergic rhinitis 05/31/2008  . Morbid obesity (HCC) 02/12/2007  . GASTROESOPHAGEAL REFLUX, NO ESOPHAGITIS 02/12/2007  . Female stress incontinence 02/12/2007  . Seizure disorder (HCC) 02/12/2007  . OSA (obstructive sleep apnea) 02/12/2007    Past Surgical History:  Procedure Laterality Date  . APPENDECTOMY    . CARDIAC CATHETERIZATION N/A 10/03/2016   Procedure: Right/Left Heart Cath and Coronary Angiography;  Surgeon: Dolores Patty, MD;  Location: [facility] INVASIVE CV LAB;  Service: Cardiovascular;  Laterality: N/A;  . HERNIA REPAIR  ? 1988; 04/15/1998  . INSERT / REPLACE / REMOVE PACEMAKER  12/16/1990   DDD EF 30%;  Marland Kitchen INSERT / REPLACE / REMOVE PACEMAKER  07/25/2003   replaced by BB, leads are both IS1 and from 1992  . INSERT / REPLACE / REMOVE PACEMAKER  05/21/2011   JA; generator change  . LEG SKIN LESION  BIOPSY / EXCISION  05/16/2001   Lichen Planus  . VENTRAL HERNIA REPAIR  1989; 04/15/1998     OB History   No obstetric history on file.     Family History  Problem Relation Age of Onset  . Stroke Father        died from CAD?  Marland Kitchen Heart disease Father   . Cancer Mother   . Diabetes Son        Type 1  . Sleep apnea Son   . Cancer Sister        cervical  . Hypertension Sister   . Hypertension Brother   . Rheum arthritis Daughter        Also PGM, PGGM    Social History   Tobacco Use  . Smoking status: Former Smoker    Packs/day: 1.00    Years: 7.00    Pack years: 7.00    Types: Cigarettes    Quit date: 03/16/1969    Years since quitting: 51.3  . Smokeless tobacco: Never Used    Substance Use Topics  . Alcohol use: No  . Drug use: No    Home Medications Prior to Admission medications   Medication Sig Start Date End Date Taking? Authorizing Provider  amiodarone (PACERONE) 200 MG tablet Take 0.5 tablets (100 mg total) by mouth daily. Patient taking differently: Take 200 mg by mouth daily.  01/12/20   [name], [name], MD  cholecalciferol (VITAMIN D) 25 MCG (1000 UNIT) tablet TAKE 1 TABLET BY  MOUTH ONCE DAILY. 01/26/20   Latrelle Dodrill, MD  edoxaban (SAVAYSA) 60 MG TABS tablet Take 60 mg by mouth daily. 01/31/20   [name], [name], MD  fluticasone (FLONASE) 50 MCG/ACT nasal spray Place 2 sprays into both nostrils daily as needed. For congestion. 07/28/17   Latrelle Dodrill, MD  omeprazole (PRILOSEC) 20 MG capsule TAKE (1) CAPSULE BY MOUTH ONCE DAILY. Patient taking differently: Take 20 mg by mouth daily.  09/06/19   Latrelle Dodrill, MD  PHENobarbital (LUMINAL) 64.8 MG tablet TAKE 1 TABLET BY MOUTH TWICE DAILY 04/18/20   Latrelle Dodrill, MD  potassium chloride (K-DUR) 10 MEQ tablet TAKE 4 TABLETS BY MOUTH THREE TIMES DAILY (MORNING, NOON, AND NIGHT) Patient taking differently: Take 40 mEq by mouth 3 (three) times daily.  09/07/19   Laurey Morale, MD  torsemide (DEMADEX) 20 MG tablet TAKE (2) TABLETS BY MOUTH TWICE DAILY. Patient taking differently: Take 40 mg by mouth 2 (two) times daily.  10/04/19   [name], [name], MD  triamcinolone cream (KENALOG) 0.5 % Apply topically 3 (three) times daily. 11/12/19   Katha Cabal, DO    Allergies    Aspirin, Penicillins, Tape, Wool alcohol [lanolin], Amoxicillin, and Ampicillin  Review of Systems   Review of Systems  Constitutional: Negative for chills and fever.  HENT: Negative.   Eyes: Negative for visual disturbance.  Respiratory: Positive for shortness of breath. Negative for cough.   Cardiovascular: Positive for leg swelling. Negative for chest pain and palpitations.  Gastrointestinal:  Negative for abdominal pain, nausea and vomiting.  Genitourinary: Negative for dysuria and frequency.  Musculoskeletal: Negative for arthralgias and myalgias.  Skin: Positive for color change and wound. Negative for pallor and rash.  Neurological: Negative for dizziness, syncope and light-headedness.  All other systems reviewed and are negative.   Physical Exam Updated Vital Signs BP (!) 134/57 (BP Location: Right Arm)   Pulse 97   Temp 98.3 F (36.8 C) (Oral)   Resp 23   Ht  (1.448 m)   Wt 90.7 kg   SpO2 96%   BMI 43.28 kg/m   Physical Exam Vitals and nursing note reviewed.  Constitutional:      General: She is not in acute distress.    Appearance: She is well-developed. She is ill-appearing. She is not diaphoretic.     Comments: Chronically ill-appearing, no acute distress  HENT:     Head: Normocephalic and atraumatic.     Mouth/Throat:     Mouth: Mucous membranes are moist.     Pharynx: Oropharynx is clear.  Eyes:     General:        Right eye: No discharge.        Left eye: No discharge.     Pupils: Pupils are equal, round, and reactive to light.  Cardiovascular:     Rate and Rhythm: Normal rate and regular rhythm.     Heart sounds: Normal heart sounds. No murmur heard.  No friction rub. No gallop.   Pulmonary:     Effort: Pulmonary effort is normal. No respiratory distress.     Breath sounds: Rales present. No wheezing.     Comments: Respirations equal and unlabored, patient able to speak in full sentences, 98% on room air, rales noted in bilateral lung bases with decreased air movement, no wheezing or rhonchi Abdominal:     General: Bowel sounds are normal. There is no distension.     Palpations: Abdomen is soft. There is  no mass.     Tenderness: There is no abdominal tenderness. There is no guarding.     Comments: Abdomen soft, nondistended, nontender to palpation in all quadrants without guarding or peritoneal signs  Musculoskeletal:        General: No  deformity.     Cervical back: Neck supple.     Right lower leg: Edema present.     Left lower leg: Edema present.     Comments: Bilateral lower extremities edematous with erythema over the shin, a few areas with clear weeping blisters, no purulent drainage, legs are not hot to the touch, distal pulses intact  Skin:    General: Skin is warm and dry.     Capillary Refill: Capillary refill takes less than 2 seconds.  Neurological:     Mental Status: She is alert.     Coordination: Coordination normal.     Comments: Speech is clear, able to follow commands Moves extremities without ataxia, coordination intact  Psychiatric:        Mood and Affect: Mood normal.        Behavior: Behavior normal.     ED Results / Procedures / Treatments   Labs (all labs ordered are listed, but only abnormal results are displayed) Labs Reviewed  CBC WITH DIFFERENTIAL/PLATELET - Abnormal; Notable for the following components:      Result Value   RDW 15.7 (*)    Platelets 108 (*)    All other components within normal limits  COMPREHENSIVE METABOLIC PANEL - Abnormal; Notable for the following components:   CO2 21 (*)    Glucose, Bld 114 (*)    Albumin 3.1 (*)    Alkaline Phosphatase 129 (*)    Total Bilirubin 1.3 (*)    GFR calc non Af Amer 58 (*)    All other components within normal limits  BRAIN NATRIURETIC PEPTIDE - Abnormal; Notable for the following components:   B Natriuretic Peptide 1,419.2 (*)    All other components within normal limits  SARS CORONAVIRUS 2 BY RT PCR (HOSPITAL ORDER, PERFORMED IN [facility])  CULTURE, BLOOD (ROUTINE X 2)  CULTURE, BLOOD (ROUTINE X 2)  LACTIC ACID, PLASMA  URINALYSIS, ROUTINE W REFLEX MICROSCOPIC    EKG EKG Interpretation  Date/Time:  Wednesday July 12 2020 12:06:57 EDT Ventricular Rate:  98 PR Interval:    QRS Duration: 90 QT Interval:  342 QTC Calculation: 436 R Axis:   -25 Text Interpretation: Atrial fibrillation with occasional  ventricular-paced complexes and with premature ventricular or aberrantly conducted complexes Cannot rule out Anterior infarct , age undetermined Abnormal ECG agree, no sig change from previous Confirmed by Arby Barrette [phone]) on 07/12/2020 6:52:15 PM   Radiology DG Chest Port 1 View  Result Date: 07/12/2020 CLINICAL DATA:  Shortness of breath. EXAM: PORTABLE CHEST 1 VIEW COMPARISON:  11/08/2019 FINDINGS: Right-sided pacemaker remains in place. Cardiomegaly heads slightly increased from prior exam. Unchanged mediastinal contours. Increase in bilateral pleural effusions. Increase in interstitial opacities consistent with pulmonary edema. No pneumothorax. No acute osseous abnormalities are seen. IMPRESSION: CHF with increasing cardiomegaly, pleural effusions, and pulmonary edema. Electronically Signed   By: Narda Rutherford M.D.   On: 07/12/2020 19:36    Procedures Procedures (including critical care time)  Medications Ordered in ED Medications  furosemide (LASIX) injection 80 mg (has no administration in time range)    ED Course  I have reviewed the triage vital signs and the nursing notes.  Pertinent labs & imaging results  that were available during my care of the patient were reviewed by me and considered in my medical decision making (see chart for details).    MDM Rules/Calculators/A&P                         69 year old female presents with worsening shortness of breath and cellulitis. She has chronic history of lower extremity venous stasis dermatitis and cellulitis. States that she has noticed some weeping of clear fluid from her legs and that she has had increasing difficulty with shortness of breath with exertion over the past week, no associated chest pain.  On exam lower extremity cellulitis appears very chronic, no purulence from wounds, this is not warm to the touch, and I feel like this is more likely venous stasis dermatitis, but I suspect this is worsened due to fluid  overload and CHF exacerbation.  Infectious work-up initiated from triage, patient afebrile, normotensive, without tachycardia.  She had no leukocytosis, and normal lactic acid.  We will add on chest x-ray and BNP.  Will have patient ambulate while measuring pulse ox.  Chest x-ray consistent with CHF exacerbation with increasing cardiomegaly, pleural effusions and pulmonary edema.  Patient ambulated in the hallway and desatted to 84%.  This improved at rest.   BNP is elevated at 1419.2.  Patient's creatinine is at baseline, will order 80 of IV Lasix, patient will require admission for CHF exacerbation.  Do not feel that she needs antibiotics at this time lower extremity redness appears chronic.  Blood cultures were collected from triage and are pending.  Care discussed with family medicine teaching service who will see and admit the patient.  Final Clinical Impression(s) / ED Diagnoses Final diagnoses:  Acute on chronic congestive heart failure, unspecified heart failure type [facility])    Rx / DC Orders ED Discharge Orders    None       Legrand Rams 07/12/20 2112    Arby Barrette, MD 07/19/20 [phone]

## 2020-07-12 NOTE — ED Provider Notes (Signed)
Medical screening examination/treatment/procedure(s) were conducted as a shared visit with non-physician practitioner(s) and myself.  I personally evaluated the patient during the encounter.  EKG Interpretation  Date/Time:  Wednesday July 12 2020 12:06:57 EDT Ventricular Rate:  98 PR Interval:    QRS Duration: 90 QT Interval:  342 QTC Calculation: 436 R Axis:   -25 Text Interpretation: Atrial fibrillation with occasional ventricular-paced complexes and with premature ventricular or aberrantly conducted complexes Cannot rule out Anterior infarct , age undetermined Abnormal ECG agree, no sig change from previous Confirmed by Arby Barrette [phone]) on 07/12/2020 6:52:15 PM Patient reports she has been getting increasingly short of breath.  She reports she is trying to prop up at nighttime but finding it hard to breathe.  She reports over the past day now it started to feel like she has a weight on her chest when she is trying to breathe.  She also has got increased redness and swelling of her legs.  Patient is alert with clear mental status.  No respiratory distress at rest.  Fine crackles bilateral lung fields.  3\6 systolic ejection murmur.  Abdomen nontender.  2-3+ pitting edema bilateral lower extremities.  Chest x-ray shows increasing vascular congestion and BNP is significantly elevated.  At this time findings consistent with CHF exacerbation.  Agree with plan and management.   Arby Barrette, MD 07/12/20 [phone]

## 2020-07-12 NOTE — ED Notes (Signed)
Pt Oxygen dropped to 84 and stayed stable while walking

## 2020-07-12 NOTE — ED Triage Notes (Signed)
Patient c/o shortness of breath, no more than usual. Stated she is here for the redness and swelling on her legs. Stated she finished antibiotic cream couple days ago.

## 2020-07-13 ENCOUNTER — Inpatient Hospital Stay: Payer: Self-pay

## 2020-07-13 ENCOUNTER — Inpatient Hospital Stay (HOSPITAL_COMMUNITY): Payer: Medicare HMO

## 2020-07-13 DIAGNOSIS — I509 Heart failure, unspecified: Secondary | ICD-10-CM

## 2020-07-13 DIAGNOSIS — I5021 Acute systolic (congestive) heart failure: Secondary | ICD-10-CM | POA: Diagnosis not present

## 2020-07-13 DIAGNOSIS — I5043 Acute on chronic combined systolic (congestive) and diastolic (congestive) heart failure: Secondary | ICD-10-CM

## 2020-07-13 LAB — URINALYSIS, ROUTINE W REFLEX MICROSCOPIC
Bacteria, UA: NONE SEEN
Bilirubin Urine: NEGATIVE
Bilirubin Urine: NEGATIVE
Glucose, UA: NEGATIVE mg/dL
Glucose, UA: NEGATIVE mg/dL
Hgb urine dipstick: NEGATIVE
Ketones, ur: NEGATIVE mg/dL
Ketones, ur: NEGATIVE mg/dL
Leukocytes,Ua: NEGATIVE
Leukocytes,Ua: NEGATIVE
Nitrite: NEGATIVE
Nitrite: NEGATIVE
Protein, ur: NEGATIVE mg/dL
Protein, ur: NEGATIVE mg/dL
Specific Gravity, Urine: 1.005 (ref 1.005–1.030)
Specific Gravity, Urine: 1.008 (ref 1.005–1.030)
pH: 6 (ref 5.0–8.0)
pH: 7 (ref 5.0–8.0)

## 2020-07-13 LAB — CBC
HCT: 37.1 % (ref 36.0–46.0)
Hemoglobin: 11.7 g/dL — ABNORMAL LOW (ref 12.0–15.0)
MCH: 29.1 pg (ref 26.0–34.0)
MCHC: 31.5 g/dL (ref 30.0–36.0)
MCV: 92.3 fL (ref 80.0–100.0)
Platelets: 89 10*3/uL — ABNORMAL LOW (ref 150–400)
RBC: 4.02 MIL/uL (ref 3.87–5.11)
RDW: 15.7 % — ABNORMAL HIGH (ref 11.5–15.5)
WBC: 3.3 10*3/uL — ABNORMAL LOW (ref 4.0–10.5)
nRBC: 0 % (ref 0.0–0.2)

## 2020-07-13 LAB — ECHOCARDIOGRAM COMPLETE
Calc EF: 28 %
Height: 57 in
MV M vel: 4.3 m/s
MV Peak grad: 74 mmHg
S' Lateral: 6 cm
Single Plane A2C EF: 29.4 %
Single Plane A4C EF: 23.4 %
Weight: 3308.66 oz

## 2020-07-13 LAB — COOXEMETRY PANEL
Carboxyhemoglobin: 1.4 % (ref 0.5–1.5)
Methemoglobin: 0.7 % (ref 0.0–1.5)
O2 Saturation: 56.4 %
Total hemoglobin: 13.1 g/dL (ref 12.0–16.0)

## 2020-07-13 LAB — BASIC METABOLIC PANEL
Anion gap: 8 (ref 5–15)
BUN: 13 mg/dL (ref 8–23)
CO2: 26 mmol/L (ref 22–32)
Calcium: 8.8 mg/dL — ABNORMAL LOW (ref 8.9–10.3)
Chloride: 106 mmol/L (ref 98–111)
Creatinine, Ser: 0.9 mg/dL (ref 0.44–1.00)
GFR calc Af Amer: >60 mL/min (ref >60)
GFR calc non Af Amer: >60 mL/min (ref >60)
Glucose, Bld: 94 mg/dL (ref 70–99)
Potassium: 3 mmol/L — ABNORMAL LOW (ref 3.5–5.1)
Sodium: 140 mmol/L (ref 135–145)

## 2020-07-13 MED ORDER — POTASSIUM CHLORIDE CRYS ER 20 MEQ PO TBCR
40.0000 meq | EXTENDED_RELEASE_TABLET | ORAL | Status: AC
  Administered 2020-07-13 (×2): 40 meq via ORAL
  Filled 2020-07-13 (×2): qty 2

## 2020-07-13 MED ORDER — POTASSIUM CHLORIDE CRYS ER 20 MEQ PO TBCR: 40.0000 meq | EXTENDED_RELEASE_TABLET | Freq: Once | ORAL | Status: DC

## 2020-07-13 MED ORDER — CHLORHEXIDINE GLUCONATE CLOTH 2 % EX PADS
6.0000 | MEDICATED_PAD | Freq: Every day | CUTANEOUS | Status: DC
  Administered 2020-07-13 – 2020-07-18 (×6): 6 via TOPICAL

## 2020-07-13 MED ORDER — EDOXABAN TOSYLATE 30 MG PO TABS
60.0000 mg | ORAL_TABLET | ORAL | Status: DC
  Administered 2020-07-13 – 2020-07-18 (×6): 60 mg via ORAL
  Filled 2020-07-13 (×6): qty 2

## 2020-07-13 MED ORDER — PERFLUTREN LIPID MICROSPHERE
1.0000 mL | INTRAVENOUS | Status: AC | PRN
  Administered 2020-07-13: 3 mL via INTRAVENOUS
  Filled 2020-07-13: qty 10

## 2020-07-13 MED ORDER — FUROSEMIDE 10 MG/ML IJ SOLN
80.0000 mg | Freq: Two times a day (BID) | INTRAMUSCULAR | Status: DC
  Administered 2020-07-13 – 2020-07-14 (×3): 80 mg via INTRAVENOUS
  Filled 2020-07-13 (×3): qty 8

## 2020-07-13 MED ORDER — SODIUM CHLORIDE 0.9% FLUSH: 10.0000 mL | INTRAVENOUS | Status: DC | PRN

## 2020-07-13 MED ORDER — SODIUM CHLORIDE 0.9% FLUSH
10.0000 mL | Freq: Two times a day (BID) | INTRAVENOUS | Status: DC
  Administered 2020-07-13 – 2020-07-18 (×8): 10 mL

## 2020-07-13 MED ORDER — SPIRONOLACTONE 12.5 MG HALF TABLET
12.5000 mg | ORAL_TABLET | Freq: Every day | ORAL | Status: DC
  Administered 2020-07-13 – 2020-07-14 (×2): 12.5 mg via ORAL
  Filled 2020-07-13 (×2): qty 1

## 2020-07-13 NOTE — Consult Note (Addendum)
Advanced Heart Failure Team Consult Note   Primary Physician: Latrelle Dodrill, MD PCP-Cardiologist:  Dr. Gala Romney  EP- Dr. Johney Frame   Reason for Consultation: Acute on Chronic Systolic Heart Failure.   HPI:    Misty Gray is seen today for evaluation of acute on chronic systolic heart failure  at the request of Dr. Jennette Kettle, [facility].   Misty Gray is a 68y/o woman with history of morbid obesity, OSA, chronic atrial fibrillation with tachy-brady syndrome s/p St Jude PPM, frequent PVCs, systolic HF due to NICM.  Admitted for ADHF in 10/17. Cath with normal coronary arteries and elevated filling pressures. Echo 10/17 EF 30-35% (previously 35-40%). Felt to have possible PVC or RV pacing cardiomyopathy. (She had 81% RV pacing and 15-20 PVCs per minute). Started on po amio with suppression of PVCs. Diuresed 41 pounds. Discharge weight 197.   Admitted 12/14 through 12/06/2016 with marked volume overload. Discharge weight was 195 pounds.Transitioned to torsemide 40 mg in am and 20 mg in pm.  Echo8/2018 LVEF 25-30%, Trivial AI, Mod MR, Severe LAE, Mild RV dilation, Mild RAE, PA peak pressure 31 mm Hg.  Echo 1/20 EF 25-30% (no improvement with PVC suppression).  Unfortunately, she has not been able to tolerate much GDMT. Intolerant to  blockers and failed losartan due to hypotension. Clinic notes outline that SGLT2is have been avoided due to risk of genital yeast infections.   She presented to the [facility] yesterday w/ complaints of worsening exertional dyspnea and bilateral LE, and weeping edema. Also recent orthopnea/ PND and nocturnal cough. Denies CP. Reports full compliance w/ home diuretics and decent UOP. Denies dietary indiscretion w/ sodium. Fully compliant w/ CPAP. Has not had EP clinic f/u since 2019. Home transmitter not working per pt report.   In the ED, she was found to be in acute on chronic heart failure. BNP elevated 1,419. CXR w/ increasing cardiomegaly, pleural  effusions and pulmonary edema. SCr normal 0.99. K 4.2. EKG shows rate controlled afib.  Admitted by [facility] medicine and started on IV Lasix 80 mg x 1 (home diuretic regimen = torsemide 40 mg bid). -1.2L in UOP yesterday. SCr stable. K low at 3.0 today. BP soft this AM, low 100s systolic. Repeat echo pending.     Studies:  [facility] 10/03/16 Ao = 94/58 (72) LV = 104/20 RA = 21 RV = 65/4/19 PA = 65/21 (38) PCW = 23 Fick cardiac output/index = 6.1/2.9 PVR = 2.5 WU FA sat = 97%  PA sat = 69%, 70%  Assessment: 1. Normal coronary arteries 2. NICM with EF 30-35% by echo 3. Persistently elevated biventricular pressures  Most recent Echo 12/2018: LVEF 25-30%, diffuse hypokenisis. Mild LVH. Mild MR. RV normal.    Review of Systems: [y] = yes, [ ]  = no   . General: Weight gain [ Y]; Weight loss [ ] ; Anorexia [ ] ; Fatigue [ ] ; Fever [ ] ; Chills [ ] ; Weakness [ ]   . Cardiac: Chest pain/pressure [ ] ; Resting SOB [Y ]; Exertional SOB [ Y]; Orthopnea [ Y]; Pedal Edema [Y ]; Palpitations [ ] ; Syncope [ ] ; Presyncope [ ] ; Paroxysmal nocturnal dyspnea[Y ]  . Pulmonary: Cough [ ] ; Wheezing[ ] ; Hemoptysis[ ] ; Sputum [ ] ; Snoring [ ]   . GI: Vomiting[ ] ; Dysphagia[ ] ; Melena[ ] ; Hematochezia [ ] ; Heartburn[ ] ; Abdominal pain [ ] ; Constipation [ ] ; Diarrhea [ ] ; BRBPR [ ]   . GU: Hematuria[ ] ; Dysuria [ ] ; Nocturia[ ]   . Vascular: Pain in  legs with walking [ ] ; Pain in feet with lying flat [ ] ; Non-healing sores [ ] ; Stroke [ ] ; TIA [ ] ; Slurred speech [ ] ;  . Neuro: Headaches[ ] ; Vertigo[ ] ; Seizures[ ] ; Paresthesias[ ] ;Blurred vision [ ] ; Diplopia [ ] ; Vision changes [ ]   . Ortho/Skin: Arthritis [ ] ; Joint pain [ ] ; Muscle pain [ ] ; Joint swelling [ ] ; Back Pain [ ] ; Rash [ ]   . Psych: Depression[ ] ; Anxiety[ ]   . Heme: Bleeding problems [ ] ; Clotting disorders [ ] ; Anemia [ ]   . Endocrine: Diabetes [ ] ; Thyroid dysfunction[ ]   Home Medications Prior to Admission medications   Medication Sig Start  Date End Date Taking? Authorizing Provider  amiodarone (PACERONE) 200 MG tablet Take 0.5 tablets (100 mg total) by mouth daily. Patient taking differently: Take 200 mg by mouth daily.  01/12/20  Yes [name], , MD  cholecalciferol (VITAMIN D) 25 MCG (1000 UNIT) tablet TAKE 1 TABLET BY MOUTH ONCE DAILY. Patient taking differently: Take 1,000 Units by mouth daily.  01/26/20  Yes , MD  edoxaban (SAVAYSA) 60 MG TABS tablet Take 60 mg by mouth daily. 01/31/20  Yes [name], , MD  fluticasone (FLONASE) 50 MCG/ACT nasal spray Place 2 sprays into both nostrils daily as needed. For congestion. 07/28/17  Yes , MD  omeprazole (PRILOSEC) 20 MG capsule TAKE (1) CAPSULE BY MOUTH ONCE DAILY. Patient taking differently: Take 20 mg by mouth daily.  09/06/19  Yes , MD  PHENobarbital (LUMINAL) 64.8 MG tablet TAKE 1 TABLET BY MOUTH TWICE DAILY Patient taking differently: Take 64.8 mg by mouth 2 (two) times daily.  04/18/20  Yes , MD  potassium chloride (K-DUR) 10 MEQ tablet TAKE 4 TABLETS BY MOUTH THREE TIMES DAILY (MORNING, NOON, AND NIGHT) Patient taking differently: Take 40 mEq by mouth 3 (three) times daily.  09/07/19  Yes , MD  torsemide (DEMADEX) 20 MG tablet TAKE (2) TABLETS BY MOUTH TWICE DAILY. Patient taking differently: Take 40 mg by mouth 2 (two) times daily.  10/04/19  Yes [name], , MD  triamcinolone cream (KENALOG) 0.5 % Apply topically 3 (three) times daily. 11/12/19  Yes , DO    Past Medical History: Past Medical History:  Diagnosis Date  . Abnormal CT of the head 12/16/1984   R parietal atrophy  . Allergic rhinitis, cause unspecified   . Altered mental status 11/28/2016  . Anemia   . Arthritis    "hands and knees" (09/06/2015)  . Atrial fibrillation (HCC)   . Cellulitis 09/07/2015  . Cellulitis and abscess of leg 09/2016   bilateral  . CHF (congestive heart  failure) (HCC)   . Diaphragmatic hernia without mention of obstruction or gangrene   . Dyspnea   . Exertional dyspnea 06/22/12  . Family history of adverse reaction to anesthesia    "daughter fights w/them; just can't relax during OR; stay awake during the OR"  . Fatigue 06/22/2012  . Female stress incontinence   . GERD (gastroesophageal reflux disease)   . Grand mal seizure (HCC)   . H/O hiatal hernia    two removed  . Heart murmur   . High cholesterol   . History of blood transfusion 1956   "related to nose bleed"  . Hypertension   . Influenza A 01/11/2014  . Lichenification and lichen simplex chronicus   . Migraine    "when I was a teenager"  . Morbid  obesity (HCC)   . Nonischemic cardiomyopathy (HCC)    EF has normalized-repeat Pending   . On home oxygen therapy    "2L when I'm asleep in bed" (09/06/2015)  . OSA on CPAP   . Pacemaker  st judes    Patient states "this is my third pacemaker"  . Pneumonia 2004; 2011; 11/2011  . Seizures (HCC) since 1981   "can hear you talking but sounds like you are in big tunnel; have them often if not taking RX; last one was 06/17/12" (09/06/2015)  . Shortness of breath 10/24/2010   Qualifier: Diagnosis of  By: [place], Bonnie    . Sick sinus syndrome (HCC)    WITH PRIOR DDD PACEMAKER IMPLANTATION  . Somnolence   . Stroke [facility]) 1988   "mouth drawed real bad on my left side; found out it was a seizure"  . Syncope and collapse 06/22/12   "hit forehead and left knee"; denies loss of consciousness  . Unspecified venous (peripheral) insufficiency     Past Surgical History: Past Surgical History:  Procedure Laterality Date  . APPENDECTOMY    . CARDIAC CATHETERIZATION N/A 10/03/2016   Procedure: Right/Left Heart Cath and Coronary Angiography;  Surgeon: Dolores Patty, MD;  Location: [facility] INVASIVE CV LAB;  Service: Cardiovascular;  Laterality: N/A;  . HERNIA REPAIR  ? 1988; 04/15/1998  . INSERT / REPLACE / REMOVE PACEMAKER  12/16/1990   DDD EF 30%;    Marland Kitchen INSERT / REPLACE / REMOVE PACEMAKER  07/25/2003   replaced by BB, leads are both IS1 and from 1992  . INSERT / REPLACE / REMOVE PACEMAKER  05/21/2011   JA; generator change  . LEG SKIN LESION  BIOPSY / EXCISION  05/16/2001   Lichen Planus  . VENTRAL HERNIA REPAIR  1989; 04/15/1998    Family History: Family History  Problem Relation Age of Onset  . Stroke Father        died from CAD?  Marland Kitchen Heart disease Father   . Cancer Mother   . Diabetes Son        Type 1  . Sleep apnea Son   . Cancer Sister        cervical  . Hypertension Sister   . Hypertension Brother   . Rheum arthritis Daughter        Also PGM, PGGM    Social History: Social History   Socioeconomic History  . Marital status: Married    Spouse name: [name]  . Number of children: 4  . Years of education: 8  . Highest education level: Not on file  Occupational History  . Occupation: disabled  Tobacco Use  . Smoking status: Former Smoker    Packs/day: 1.00    Years: 7.00    Pack years: 7.00    Types: Cigarettes    Quit date: 03/16/1969    Years since quitting: 51.3  . Smokeless tobacco: Never Used  Substance and Sexual Activity  . Alcohol use: No  . Drug use: No  . Sexual activity: Not Currently    Birth control/protection: Post-menopausal  Other Topics Concern  . Not on file  Social History Narrative   Lives with husband [name] lives with them as do his 2 children   Daughter, [name], and her 3 children live in [place] -  [name] is incarcerated   Nephew, Shiya Fogelman lives with them   Daughter, [name], in [address]  [place]   Moved June 2013 to a better home on [address]      Lives with husband, son, grandson, granddaughter, nephew.   Social Determinants of Health   Financial Resource Strain:   . Difficulty of Paying Living Expenses:   Food Insecurity:   . Worried About Programme researcher, broadcasting/film/video in the Last Year:   . Barista in the  Last Year:   Transportation Needs:   . Freight forwarder (Medical):   Marland Kitchen Lack of Transportation (Non-Medical):   Physical Activity:   . Days of Exercise per Week:   . Minutes of Exercise per Session:   Stress:   . Feeling of Stress :   Social Connections:   . Frequency of Communication with Friends and Family:   . Frequency of Social Gatherings with Friends and Family:   . Attends Religious Services:   . Active Member of Clubs or Organizations:   . Attends Banker Meetings:   Marland Kitchen Marital Status:     Allergies:  Allergies  Allergen Reactions  . Aspirin Hives and Rash  . Penicillins Rash and Other (See Comments)    Has patient had a PCN reaction causing immediate rash, facial/tongue/throat swelling, SOB or lightheadedness with hypotension: Yes Has patient had a PCN reaction causing severe rash involving mucus membranes or skin necrosis: No Has patient had a PCN reaction that required hospitalization No Has patient had a PCN reaction occurring within the last 10 years: Yes If all of the above answers are "NO", then may proceed with Cephalosporin use.  Patient has tolerated cephalosporins in 2017.  . Tape Other (See Comments)    NO PAPER TAPE-MUST BE MEDICAL TAPE - unknown reaction NO PAPER TAPE-MUST BE MEDICAL TAPE - unknown reaction  . Wool Alcohol [Lanolin] Hives  . Amoxicillin Rash    Patient has tolerated cephalosporins  . Ampicillin Itching and Rash    Patient has tolerated cephalosporins    Objective:    Vital Signs:   Temp:  [97.4 F (36.3 C)-99.5 F (37.5 C)] 97.7 F (36.5 C) (07/29 0457) Pulse Rate:  [67-97] 92 (07/29 0457) Resp:  [16-23] 16 (07/29 0457) BP: (94-134)/(57-94) 104/65 (07/29 0457) SpO2:  [92 %-99 %] 99 % (07/29 0457) Weight:  [90.7 kg-93.8 kg] 93.8 kg (07/29 0120) Last BM Date: 07/11/20  Weight change: Filed Weights   07/12/20 1157 07/12/20 2148 07/13/20 0120  Weight: 90.7 kg (!) 93.8 kg (!) 93.8 kg     Intake/Output:   Intake/Output Summary (Last 24 hours) at 07/13/2020 0906 Last data filed at 07/13/2020 0117 Gross per 24 hour  Intake 240 ml  Output 1150 ml  Net -910 ml      Physical Exam    General:  Obese elderly WF sitting up in chair. No resp difficulty HEENT: normal Neck: supple. JVP elevated to jawline. Carotids 2+ bilat; no bruits. No lymphadenopathy or thyromegaly appreciated. Cor: PMI nondisplaced. Irregularly irregular rhythm, mildly tachy rate. No rubs, gallops or murmurs. Lungs: decreased BS at the bases bilaterally, w/ faint basilar crackles R>L Abdomen: obese, soft, nontender, nondistended. No hepatosplenomegaly. No bruits or masses. Good bowel sounds. Extremities: no cyanosis, clubbing, rash, 1+ bilateral tense LE w/ chronic venus stasis dermatitis and crusting wounds.  Neuro: alert & orientedx3, cranial nerves grossly intact. moves all 4 extremities w/o difficulty. Affect pleasant   Telemetry   Atrial fibrillation low 100s, PVCs. No NSVT   EKG    Atrial fibrillation 96 bpm   Labs  Basic Metabolic Panel: Recent Labs  Lab 07/12/20 1201 07/13/20 0417  NA 138 140  K 4.2 3.0*  CL 104 106  CO2 21* 26  GLUCOSE 114* 94  BUN 17 13  CREATININE 0.99 0.90  CALCIUM 9.5 8.8*    Liver Function Tests: Recent Labs  Lab 07/12/20 1201  AST 21  ALT 12  ALKPHOS 129*  BILITOT 1.3*  PROT 7.1  ALBUMIN 3.1*   No results for input(s): LIPASE, AMYLASE in the last 168 hours. No results for input(s): AMMONIA in the last 168 hours.  CBC: Recent Labs  Lab 07/12/20 1201 07/13/20 0417  WBC 4.3 3.3*  NEUTROABS 2.8  --   HGB 12.4 11.7*  HCT 39.3 37.1  MCV 92.9 92.3  PLT 108* 89*    Cardiac Enzymes: No results for input(s): CKTOTAL, CKMB, CKMBINDEX, TROPONINI in the last 168 hours.  BNP: BNP (last 3 results) Recent Labs    08/02/19 1618 11/08/19 1350 07/12/20 1854  BNP 988.1* 4,306.1* 1,419.2*    ProBNP (last 3 results) No results for  input(s): PROBNP in the last 8760 hours.   CBG: No results for input(s): GLUCAP in the last 168 hours.  Coagulation Studies: No results for input(s): LABPROT, INR in the last 72 hours.   Imaging   DG Chest Port 1 View  Result Date: 07/12/2020 CLINICAL DATA:  Shortness of breath. EXAM: PORTABLE CHEST 1 VIEW COMPARISON:  11/08/2019 FINDINGS: Right-sided pacemaker remains in place. Cardiomegaly heads slightly increased from prior exam. Unchanged mediastinal contours. Increase in bilateral pleural effusions. Increase in interstitial opacities consistent with pulmonary edema. No pneumothorax. No acute osseous abnormalities are seen. IMPRESSION: CHF with increasing cardiomegaly, pleural effusions, and pulmonary edema. Electronically Signed   By: Narda Rutherford M.D.   On: 07/12/2020 19:36      Medications:     Current Medications: . amiodarone  100 mg Oral Daily  . edoxaban  60 mg Oral Daily  . pantoprazole  40 mg Oral Daily  . PHENobarbital  64.8 mg Oral BID  . potassium chloride  40 mEq Oral Q4H  . sodium chloride flush  3 mL Intravenous Q12H     Infusions: . sodium chloride        Assessment/Plan   1. Acute on Chronic Systolic Heart Failure: due to NICM. ? PVC induced vs RV pacing. Echo 12/06/2016 EF 10-15%. Cath 10/17 normal coronary arteries. Echo 02/2017 with LVEF 15-20% despite PVC suppression. Echo 07/2017 EF 25-30%. Echo 07/2017 LVEF 25-30%, Trivial AI, Mod MR, Severe LAE, Mild RV dilation, Mild RAE, PA peak pressure 31 mm Hg. Echo 1/20 EF 25-30% - Now NYHA Class IIIb symptoms w/ volume overload. BNP 1400. CXR w/ cardiomegaly, pleural effusions and pulmonary edema.  - exam somewhat limited by obesity/ body habitus. Place PICC line to check CVPs to help guide diuresis  - Continue IV Lasix, 80 mg bid.  - Repeat echo pending.  - Will obtain device interrogation to check RV pacing%  - not currently on GDMT. Intolerant to  blockers and failed losartan previously due to  hypotension. Clinic notes outline that SGLT2is have been avoided due to risk of genital yeast infections.  - w/ normal renal function, can try low dose spironolactone 12.5 mg daily and consider retrial of low dose losartan 12.5 mg qhs if BP permits.  - add digoxin 0.125 mg daily  - has followed with EP. Not felt to be candidate for CRT upgrade to alleviate RV pacing. QRS <130 ms.   2.  Chronic AF with tachy-brady s/p pacemaker - Rate low 100s  - Continue amiodarone 100 mg daily  - add digoxin 0.125 mg daily given concomitant systolic heart failure  - Continue edoxaban 60 mg daily.Denies bleeding.H/H ok. Pt platelets 89K. monitor for bleeding  - no device check since 2019. I have asked device rep to interrogate.   3. PVCs - Continue PO amiodarone 100 mg daily  - Keep K > 4.0 and Mg > 2.0  - interrogate device to check PVC burden   4. OSA - continue CPAP qhs   5. Hypokalemia - K 3.0  - supplement w/ KCl  - start spiro 12.5 mg daily  - monitor w/ diuresis   Length of Stay: 1  Brittainy Simmons, PA-C  07/13/2020, 9:06 AM  Advanced Heart Failure Team Pager [phone] (M-F; 7a - 4p)  Please contact [facility] for night-coverage after hours (4p -7a ) and weekends on [email or web address]  Patient seen with PA, agree with the above note.    She has history of nonischemic cardiomyopathy, chronic atrial fibrillation, and tachy-brady syndrome with a St Jude PPM.  She was admitted with increased dyspnea and edema, found to have CHF exacerbation.  Reports compliance with home medication regimen.  She has tolerated minimal CHF medications, only on torsemide + amiodarone + edoxaban at home.   Echo today showed EF 25-30%, moderately dilated LV, moderately dilated RV with moderately decreased RV systolic function.  This is stable.   Device interrogation today showed 28% RV pacing and chronic atrial fibrillation.  She is not currently RV pacing.   General: NAD, kyphotic Neck: JVP 12+ cm, no  thyromegaly or thyroid nodule.  Lungs: Decreased at bases.  CV: Nondisplaced PMI.  Heart irregular S1/S2, no S3/S4, no murmur.  1+ edema to knees.   Abdomen: Soft, nontender, no hepatosplenomegaly, no distention.  Skin: Intact without lesions or rashes.  Neurologic: Alert and oriented x 3.  Psych: Normal affect. Extremities: No clubbing or cyanosis.  HEENT: Normal.   1. Acute on chronic systolic CHF: Nonischemic cardiomyopathy.  Echo today with EF 25-30%, moderate RV dysfunction.  Stable from prior.  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 Johney Frame in 2019.  Currently RV pacing 28% of the time.  She is volume overloaded on exam, reports compliance with home regimen.  NYHA class IIIb-IV at admission.  Very limited GDMT due to inability to tolerate HF meds.  - Agree with Lasix 80 mg IV bid for now, follow I/Os closely.  - Agree with PICC line to follow CVP.  She is kyphotic and exam is difficult.  - Agree with digoxin 0.125 daily.  - Will try her on spironolactone 12.5 mg daily, may be able to try losartan at low dose as well if she tolerates.  - Decreased backup pacing rate to 50 bpm from 70 bpm to try to limit RV pacing.  2. Atrial fibrillation: Chronic.  She is on edoxaban.  Atria are large and she has been in atrial fibrillation for a prolonged period of time, unlikely to successfully cardiovert.  3. PVCs: She is on amiodarone to suppress PVCs.  PVCs appear rare on telemetry review.  Continue amiodarone.  4. OSA: CPAP.   Marca Ancona 07/13/2020 11:56 AM

## 2020-07-13 NOTE — Evaluation (Signed)
Occupational Therapy Evaluation Patient Details Name: Misty Gray MRN: [id number] DOB: Oct 14, 1951 Today's Date: 07/13/2020    History of Present Illness Pt is a 69 y.o. female presenting with BLE edema with clear drainage and worsening DOE x1 week. PMH significant for afib, pacemaker, OSA on CPAP, CHF, CKD III, and seizure disorder.   Clinical Impression   PTA, patient was living with family and was requiring some assistance with LB bathing/dressing. Patient notes ambulating in home without use of AD and occasional use of SPC outside of the home. Patient reports being able to prepare meals occasionally in standing with shared responsibility with her husband. Patient currently presents near baseline level of function requiring supervision A with cues for safe use of RW during functional tranfers and mobility. Patient also requiring set-up assist to Min A grossly for BADLs. Patient would benefit from continued acute OT services to maximize safety and independence in prep for safe return to prior level of living with recommendation for [facility] although patient may progress to no OT follow-up.     Follow Up Recommendations  Home health OT;Supervision - Intermittent    Equipment Recommendations  None recommended by OT    Recommendations for Other Services       Precautions / Restrictions Precautions Precautions: Fall Restrictions Weight Bearing Restrictions: No      Mobility Bed Mobility Overal bed mobility: Needs Assistance Bed Mobility: Supine to Sit     Supine to sit: Supervision     General bed mobility comments: OOB in recliner on entry  Transfers Overall transfer level: Needs assistance Equipment used: Rolling walker (2 wheeled) Transfers: Sit to/from [employer] Sit to Stand: Supervision Stand pivot transfers: Supervision       General transfer comment: Cues for walker management through doorways and over thresholds     Balance Overall balance  assessment: Needs assistance Sitting-balance support: Feet supported;Single extremity supported Sitting balance-Leahy Scale: Good Sitting balance - Comments: Able to reach toward the ground seated EOB without LOB   Standing balance support: Bilateral upper extremity supported;During functional activity Standing balance-Leahy Scale: Fair Standing balance comment: Able to maintain standing balance during clothing management without UE or external support.                            ADL either performed or assessed with clinical judgement   ADL Overall ADL's : Needs assistance/impaired     Grooming: Supervision/safety Grooming Details (indicate cue type and reason): 2/3 grooming tasks standing at sink level          Upper Body Dressing : Set up;Sitting Upper Body Dressing Details (indicate cue type and reason): To don posterior hospital gown Lower Body Dressing: Minimal assistance;Sitting/lateral leans;Sit to/from stand Lower Body Dressing Details (indicate cue type and reason): To don underwear seated EOB Toilet Transfer: Supervision/safety;RW;Cueing for sequencing;Cueing for safety Toilet Transfer Details (indicate cue type and reason): Ambulation to commode in bathroom with use of RW and cues for walker management.  Toileting- Clothing Manipulation and Hygiene: Minimal assistance;Sitting/lateral lean;Sit to/from stand Toileting - Clothing Manipulation Details (indicate cue type and reason): Min A for clothing management only.      Functional mobility during ADLs: Supervision/safety;Rolling walker       Vision Patient Visual Report: No change from baseline Vision Assessment?: No apparent visual deficits     Perception     Praxis      Pertinent Vitals/Pain Pain Assessment: No/denies pain  Hand Dominance Right   Extremity/Trunk Assessment Upper Extremity Assessment Upper Extremity Assessment: Defer to OT evaluation   Lower Extremity Assessment Lower  Extremity Assessment: Generalized weakness (pt with complaints of thighs rubbing together with ambulatio)   Cervical / Trunk Assessment Cervical / Trunk Assessment: Kyphotic (Forward head)   Communication Communication Communication: No difficulties   Cognition Arousal/Alertness: Awake/alert Behavior During Therapy: WFL for tasks assessed/performed Overall Cognitive Status: Within Functional Limits for tasks assessed                                     General Comments  Patient very tangential in speech requiring frequent redirection. Redness and swelling of BLE and feet.     Exercises     Shoulder Instructions      Home Living Family/patient expects to be discharged to:: Private residence Living Arrangements: Spouse/significant other;Children Available Help at Discharge: Family;Available 24 hours/day Type of Home: House Home Access: Stairs to enter [employer] of Steps: 3 in front, 6 on back porch Entrance Stairs-Rails: Right Home Layout: One level     Bathroom Shower/Tub: Tub only   Firefighter: Standard Bathroom Accessibility: No   Home Equipment: Cane - single point;Shower seat;Walker - 2 wheels;Bedside commode          Prior Functioning/Environment Level of Independence: Needs assistance  Gait / Transfers Assistance Needed: Mod indep ambulation with and without use of RW. Use of SPC outside of home.  ADL's / Homemaking Assistance Needed: Reports assist with LB bathing. Mod I for UB BADLs and toileting with use of BSC. Assist with IADLs including homemaking and transportation. Patient reports being able to make small meals in standing without rest breaks.             OT Problem List: Decreased strength;Decreased activity tolerance;Impaired balance (sitting and/or standing);Decreased knowledge of use of DME or AE      OT Treatment/Interventions: Self-care/ADL training;Therapeutic exercise;Energy conservation;DME and/or AE  instruction;Therapeutic activities;Patient/family education;Balance training    OT Goals(Current goals can be found in the care plan section) Acute Rehab OT Goals Patient Stated Goal: To return home.  OT Goal Formulation: With patient Time For Goal Achievement: 07/27/20 Potential to Achieve Goals: Good ADL Goals Pt Will Perform Grooming: with modified independence;standing Pt Will Perform Lower Body Bathing: with modified independence;sitting/lateral leans;sit to/from stand;with adaptive equipment Pt Will Perform Lower Body Dressing: with modified independence;sitting/lateral leans;sit to/from stand;with adaptive equipment Pt Will Transfer to Toilet: with modified independence;ambulating;bedside commode Pt Will Perform Toileting - Clothing Manipulation and hygiene: with modified independence;sitting/lateral leans;sit to/from stand Additional ADL Goal #1: Patient will recall 3 energy conservation techniques in prep for BADLs.  OT Frequency: Min 2X/week   Barriers to D/C:            Co-evaluation              AM-PAC OT "6 Clicks" Daily Activity     Outcome Measure Help from another person eating meals?: A Little Help from another person taking care of personal grooming?: A Little Help from another person toileting, which includes using toliet, bedpan, or urinal?: A Little Help from another person bathing (including washing, rinsing, drying)?: A Little Help from another person to put on and taking off regular upper body clothing?: A Little Help from another person to put on and taking off regular lower body clothing?: A Lot 6 Click Score: 17   End of Session  Equipment Utilized During Treatment: Gait belt;Rolling walker  Activity Tolerance: Patient tolerated treatment well Patient left: in chair;with call bell/phone within reach;with chair alarm set  OT Visit Diagnosis: Unsteadiness on feet (R26.81);Muscle weakness (generalized) (M62.81)                Time: 1610-9604 OT  Time Calculation (min): 36 min Charges:  OT General Charges $OT Visit: 1 Visit OT Evaluation $OT Eval Moderate Complexity: 1 Mod OT Treatments $Self Care/Home Management : 8-22 mins  Cameren Earnest H. OTR/L Supplemental OT, Department of rehab services [phone]  Bonny Egger R H. 07/13/2020, 11:58 AM

## 2020-07-13 NOTE — Progress Notes (Signed)
Peripherally Inserted Central Catheter Placement  The IV Nurse has discussed with the patient and/or persons authorized to consent for the patient, the purpose of this procedure and the potential benefits and risks involved with this procedure.  The benefits include less needle sticks, lab draws from the catheter, and the patient may be discharged home with the catheter. Risks include, but not limited to, infection, bleeding, blood clot (thrombus formation), and puncture of an artery; nerve damage and irregular heartbeat and possibility to perform a PICC exchange if needed/ordered by physician.  Alternatives to this procedure were also discussed.  Bard Power PICC patient education guide, fact sheet on infection prevention and patient information card has been provided to patient /or left at bedside.    PICC Placement Documentation  PICC Double Lumen 07/13/20 PICC Left Cephalic 42 cm 0 cm (Active)  Indication for Insertion or Continuance of Line Vasoactive infusions 07/13/20 1343  Exposed Catheter (cm) 0 cm 07/13/20 1343  Site Assessment Clean;Dry;Intact 07/13/20 1343  Lumen #1 Status Flushed;Saline locked;Blood return noted 07/13/20 1343  Lumen #2 Status Flushed;Saline locked;Blood return noted 07/13/20 1343  Dressing Type Transparent;Securing device 07/13/20 1343  Dressing Status Clean;Dry;Intact;Antimicrobial disc in place 07/13/20 1343  Safety Lock Not Applicable 07/13/20 1343  Dressing Intervention New dressing 07/13/20 1343  Dressing Change Due 07/20/20 07/13/20 1343       Annett Fabian 07/13/2020, 1:46 PM

## 2020-07-13 NOTE — Progress Notes (Signed)
°  Echocardiogram 2D Echocardiogram has been performed.  Misty Gray 07/13/2020, 8:24 AM

## 2020-07-13 NOTE — Progress Notes (Signed)
Family Medicine Teaching Service Daily Progress Note Intern Pager: [phone]  [name] Medical record number: [id number] Date of birth: Jun 18, 1951 Age: 69 y.o. Gender: female  Primary Care Provider: Latrelle Dodrill, MD Consultants: None Code Status: DNI  Pt Overview and Major Events to Date:  Admitted 7/28  Assessment and Plan: Misty Gray is a 69 y.o. female presenting with BLE edema with clear drainage and worsening DOE x 1 week. PMH is significant for HFrEF (EF 25-30%) 2/2 NICM, A Fib with Tachy-Brady Syndrome s/p PPM on Edoxaban, morbid obesity, OSA on CPAP, HTN, Hx Seizures.  Shortness of Breath 2/2 CHF Exaceration HFrEF (EF 25-30%) Home medication: torsemide 40mg  BID. VSS on presentation to ED.  Patient satting 92-99% on room air. CXR showed increasing cardiomegaly, pleural effusions and pulmonary edema.  BNP elevated to 1419, has been 3x as high in the past.  Last Echo 12/30/18 with EF 25-30%, repeat echo today shows same EF. Additionally significant for severely elevated pulmonary pressures, RA pressure of 60.2 mmHg, dilated atria R>L, dilated IVC.  Urine output 1,150 overnight. Cards was consulted. Recommendations included below. - s/p IV Lasix 80mg  x1. Will continue 80mg  IV BID per cardio recs - PICC line to follow CVP per cardio recs - Digoxin 0.125 daily per cardio recs - Spironolactone 12.5mg  daily, additionally consider losartan at low dose if she tolerates, per cardiology - Continue on edoxaban and amiodarone - vitals per floor protocol - cardiac monitoring - strict I/O - daily weights - AM BMP/CBC - PT/OT recs - f/u blood cultures drawn in ED, though no signs of infection  Chronic Venous Stasis Dermatitis Patient initially presented for concern for possible worsening of BLE chronic venous stasis dermatitis with clear wound drainage.  No fevers.  Clear discharge on exam, no increased warmth to palpation.  See images below.  WBC 4.3.  No sign of  infection.  Will treat as chronic wounds and have wound care while inpatient. - wound care consult - if worsening in appearance or WBC, would consider treatment for cellulitis  Atrial Fibrillation Home medications: amiodarone 100mg  QD and edoxaban 60mg  QD.  EKG with A fib and 1 PVC.  History of Tachy-Brady syndrome, has pacemaker in place.  Per last cardiology note on 01/12/2020, pt has followed with EP in the past and they do not feel she is good candidate for CRT upgrade to alleviate pacing. Cardiology decreased backup pacing rate to 50 bpm from 70 bpm to try to limit RV pacing.  - continue home amiodarone 100mg  - continue home edoxaban  Thrombocytopenia Plt 108 on admisison.  Appears chronic on chart review, going back as far as 2008.  Have been as low as 85 previously.  AST/ALT WNL. Consider outpatient heme referral, peripheral smear, and further workup, as do not see where this has been done in the chart. - monitor CBC - consider outpatient heme referral  HTN On problem list, not on home antihypertensives.  BP this AM 104/65.  Per cards note, patient failed low dose losartan due to hypotension and has been unable to tolerate BB in past. - cont to monitor BP  Hx Seizures Well controlled on phenobarbital 64.8 mg BID. - continue home phenobarbital  OSA On CPAP at home. - CPAP QHS  FEN/GI: heart healthy Prophylaxis: Edoxaban  Disposition: Inpatient   Subjective:  Patient tells me that she has been short of breath for the last few months. She is compliant with her home diuretic, Torsemide. Tells me she  has been peeing a lot. She usually sleeps on a couple pillows, is unable to lay flat due to SOB. Also tells me about her right leg wound, states it is "burning". Has some pain to area.    Objective: Temp:  [97.4 F (36.3 C)-99.5 F (37.5 C)] 97.7 F (36.5 C) (07/29 0457) Pulse Rate:  [67-97] 92 (07/29 0457) Resp:  [16-23] 16 (07/29 0457) BP: (94-134)/(57-94) 104/65 (07/29  0457) SpO2:  [92 %-99 %] 99 % (07/29 0457) Weight:  [90.7 kg-93.8 kg] 93.8 kg (07/29 0120) Physical Exam: General: Pleasant, awake, alert, no acute distress Cardiovascular: irregularly irregular rhythm Respiratory: fine crackles appreciated posterior lower bases b/l Abdomen: obese, non-tender in all quadrants Extremities: b/l feet with 2+ pitting edema. Trace pitting edema to b/l legs. Legs with chronic venous stasis dermatitis. RLE lateral open wound approximately 1x0.5 cm, non draining wound covered with bandage.  Laboratory: Recent Labs  Lab 07/12/20 1201 07/13/20 0417  WBC 4.3 3.3*  HGB 12.4 11.7*  HCT 39.3 37.1  PLT 108* 89*   Recent Labs  Lab 07/12/20 1201 07/13/20 0417  NA 138 140  K 4.2 3.0*  CL 104 106  CO2 21* 26  BUN 17 13  CREATININE 0.99 0.90  CALCIUM 9.5 8.8*  PROT 7.1  --   BILITOT 1.3*  --   ALKPHOS 129*  --   ALT 12  --   AST 21  --   GLUCOSE 114* 94    Imaging/Diagnostic Tests: Echo 7/29 IMPRESSIONS  1. Left ventricular ejection fraction, by estimation, is 25 to 30%. The  left ventricle has severely decreased function. The left ventricle  demonstrates global hypokinesis. The left ventricular internal cavity size  was severely dilated. Left ventricular  diastolic parameters are indeterminate.  2. Right ventricular systolic function is moderately reduced. The right  ventricular size is moderately enlarged. There is severely elevated  pulmonary artery systolic pressure. The estimated right ventricular  systolic pressure is 60.2 mmHg.  3. Left atrial size was moderately dilated.  4. Right atrial size was severely dilated.  5. The mitral valve is abnormal. Thickened leaflets. Mild to moderate  mitral valve regurgitation.  6. Tricuspid valve regurgitation is moderate.  7. The aortic valve was not well visualized. Aortic valve regurgitation  is not visualized. Mild aortic valve sclerosis is present, with no  evidence of aortic valve  stenosis.  8. The inferior vena cava is dilated in size with <50% respiratory  variability, suggesting right atrial pressure of 15 mmHg.   DG Chest 1-View 7/28 IMPRESSION: CHF with increasing cardiomegaly, pleural effusions, and pulmonary edema.  Sabino Dick, DO 07/13/2020, 7:00 AM PGY-1, [facility] Family Medicine FPTS Intern pager: [phone], text pages welcome

## 2020-07-13 NOTE — Plan of Care (Signed)
2000 CVP monitoring resumed after patient was not connected to it. CVP reading 24.

## 2020-07-13 NOTE — Hospital Course (Addendum)
Misty Gray is a 69 y.o. female presenting with BLE edema with clear drainage and worsening DOE. PMH is significant for HFrEF (EF 25-30%) 2/2 NICM, A Fib with Tachy-Brady Syndrome s/p PPM on Edoxaban, morbid obesity, OSA on CPAP, HTN, Hx Seizures. Below is her hospital course listed by problem. Refer to H&P for additional information.   CHF Exacerbation CXR noteable for increasing cardiomegaly, pleural effusions and pulmonary edema. BNP elevated to 1419. Echo showed unchanged EF of 25-30% but additionally significant for severely elevated pulmonary pressures, RA pressure 60.19mmHg, dilated atria R>L, dilated IVC, mild to moderate mitral regurgitation and moderate tricuspid valve regurgitation. Received IV Lasix 80mg  BID with good urine output. Cardiology consulted and spironolactone 12.5 added daily and Losartan 12.5mg . PICC line was placed to follow PVC's. Patient with great UOP and weight decrease with IV lasix so was transitioned to PO Torsemide 60mg  BID. Mild elevation in creatinine  1.01>1.28 but decreased to 0.93 after transitioning to oral diuretics. Total net diuresis ***Oxygen saturation ranged *** did require supplemental oxygen to 2L. Follow up cards recs***   Chronic Venous Stasis Dermatitis  Chronic venous stasis dermatitis in bilateral lower extremities. Lateral aspect RLE with approximately 1x0.5cm open wound, initially with clear drainage. WBC within normal limits, VSS, no signs of infection. Wound care consultation placed, kept dry and covered with bandage.    Atrial Fibrillation: chronic, stable Continued on home medications: amiodarone 100 QD and edoxaban 60mg  QD. Per cardiology, patient has been in atrial fibrillation for a prolonged period of time and not recommendations, digoxin 0.125 was added daily. Device interrogated, RV pacing 28% of time.   Other conditions chronic and stable.   Follow Up Appears to have chronic thrombocytopenia that could be worked up outpatient with  peripheral smear, heme referral.

## 2020-07-13 NOTE — Progress Notes (Signed)
Physical Therapy Evaluation   Clinical impression:  PTA pt living with husband in single story home with 6 steps with rail to enter. Pt reports ambulation limited community distances with RW and assist with ADLs/iADLs. Pt is currently limited in safe mobility by 4/4 DoE with mobility in presence of decreased strength and endurance. Pt is min guard for ambulation and min A for ascent/descent of 5 steps with rail. PT recommending HHPT at discharge to work on energy conservation and improvement of strength and balance for safe navigation of her home environment. PT will continue to follow acutely.     07/13/20 1100  PT Visit Information  Last PT Received On 07/13/20  Assistance Needed +1  History of Present Illness Pt is a 69 y.o. female presenting with BLE edema with clear drainage and worsening DOE x1 week. PMH significant for afib, pacemaker, OSA on CPAP, CHF, CKD III, and seizure disorder.  Precautions  Precautions Fall  Restrictions  Weight Bearing Restrictions No  Home Living  Family/patient expects to be discharged to: Private residence  Living Arrangements Spouse/significant other;Children  Available Help at Discharge Family;Available 24 hours/day  Type of Home House  Home Access Stairs to enter  Entrance Stairs-Number of Steps 3 in front, 6 on back porch  Entrance Stairs-Rails Right  Home Layout One level  Bathroom Shower/Tub Tub only  Horticulturist, commercial No  Home Equipment Gearhart - single point;Shower seat;Walker - 2 wheels;BSC  Prior Function  Level of Independence Needs assistance  Gait / Transfers Assistance Needed Mod indep ambulation with and without use of RW. Use of SPC outside of home.   ADL's / Homemaking Assistance Needed Reports assist with LB bathing. Mod I for UB BADLs and toileting with use of BSC. Assist with IADLs including homemaking and transportation. Patient reports being able to make small meals in standing without rest breaks.    Communication  Communication No difficulties  Pain Assessment  Pain Assessment No/denies pain  Cognition  Arousal/Alertness Awake/alert  Behavior During Therapy WFL for tasks assessed/performed  Overall Cognitive Status Within Functional Limits for tasks assessed  Upper Extremity Assessment  Upper Extremity Assessment Defer to OT evaluation  Lower Extremity Assessment  Lower Extremity Assessment Generalized weakness (pt with complaints of thighs rubbing together with ambulatio)  Cervical / Trunk Assessment  Cervical / Trunk Assessment Kyphotic (Forward head)  Bed Mobility  General bed mobility comments OOB in recliner on entry  Transfers  Overall transfer level Needs assistance  Equipment used Rolling walker (2 wheeled)  Transfers Sit to/from Stand  Sit to Stand Supervision  General transfer comment supervision for safety, increased time and effort, vc for hand placement for power up.  Ambulation/Gait  Ambulation/Gait assistance Min guard  Gait Distance (Feet) 150 Feet  Assistive device Rolling walker (2 wheeled)  Gait Pattern/deviations Step-through pattern;Decreased step length - right;Decreased step length - left;Shuffle;Trunk flexed;Wide base of support  General Gait Details min guard for safety, for mildly unsteady gait, increased work of breathing with progression   Gait velocity slowed  Gait velocity interpretation <1.31 ft/sec, indicative of household ambulator  Stairs Yes  Stairs assistance Min assist  Stair Management One rail Right;Forwards;Sideways;Step to pattern  Number of Stairs 5  General stair comments min a for steadying with strong ascent of 5 steps forward with rail, requires sideways descent holding on to rail increased effort and work of breathing.   Balance  Overall balance assessment Needs assistance  Sitting-balance support Feet supported;Single extremity supported  Sitting balance-Leahy Scale Good  Standing balance support Bilateral upper extremity  supported;During functional activity  Standing balance-Leahy Scale Fair  General Comments  General comments (skin integrity, edema, etc.) B LE edema, pt with complaints of thighs rubbing with ambulation, max HR with stair training 97bpm, SaO2 on RA 98%O2 despite 4/4 DOE after stair training   PT - End of Session  Equipment Utilized During Treatment Gait belt  Activity Tolerance Patient tolerated treatment well  Patient left in chair;with call bell/phone within reach;with chair alarm set  Nurse Communication Mobility status  PT Assessment  PT Recommendation/Assessment Patient needs continued PT services  PT Visit Diagnosis Unsteadiness on feet (R26.81);Other abnormalities of gait and mobility (R26.89);Muscle weakness (generalized) (M62.81);Difficulty in walking, not elsewhere classified (R26.2)  PT Problem List Decreased range of motion;Decreased activity tolerance;Decreased balance;Decreased mobility;Cardiopulmonary status limiting activity;Obesity  PT Plan  PT Frequency (ACUTE ONLY) Min 3X/week  PT Treatment/Interventions (ACUTE ONLY) DME instruction;Gait training;Stair training;Functional mobility training;Therapeutic activities;Therapeutic exercise;Balance training;Cognitive remediation;Patient/family education  AM-PAC PT "6 Clicks" Mobility Outcome Measure (Version 2)  Help needed turning from your back to your side while in a flat bed without using bedrails? 3  Help needed moving from lying on your back to sitting on the side of a flat bed without using bedrails? 3  Help needed moving to and from a bed to a chair (including a wheelchair)? 4  Help needed standing up from a chair using your arms (e.g., wheelchair or bedside chair)? 4  Help needed to walk in hospital room? 3  Help needed climbing 3-5 steps with a railing?  3  6 Click Score 20  Consider Recommendation of Discharge To: Home with no services  PT Recommendation  Follow Up Recommendations Home health PT;Supervision for  mobility/OOB  PT equipment None recommended by PT  Individuals Consulted  Consulted and Agree with Results and Recommendations Patient  Acute Rehab PT Goals  Patient Stated Goal To return home.   PT Goal Formulation With patient  Time For Goal Achievement 07/27/20  Potential to Achieve Goals Good  PT Time Calculation  PT Start Time (ACUTE ONLY) 1054  PT Stop Time (ACUTE ONLY) 1116  PT Time Calculation (min) (ACUTE ONLY) 22 min  PT General Charges  $$ ACUTE PT VISIT 1 Visit  PT Evaluation  $PT Eval Moderate Complexity 1 Mod  Written Expression  Dominant Hand Right   Siarah Deleo B. Beverely Risen PT, DPT Acute Rehabilitation Services Pager [phone] Office [phone]

## 2020-07-13 NOTE — Plan of Care (Signed)

## 2020-07-14 DIAGNOSIS — I509 Heart failure, unspecified: Secondary | ICD-10-CM | POA: Diagnosis not present

## 2020-07-14 DIAGNOSIS — I5023 Acute on chronic systolic (congestive) heart failure: Secondary | ICD-10-CM | POA: Diagnosis not present

## 2020-07-14 LAB — BASIC METABOLIC PANEL
Anion gap: 8 (ref 5–15)
BUN: 13 mg/dL (ref 8–23)
CO2: 28 mmol/L (ref 22–32)
Calcium: 9 mg/dL (ref 8.9–10.3)
Chloride: 102 mmol/L (ref 98–111)
Creatinine, Ser: 0.86 mg/dL (ref 0.44–1.00)
GFR calc Af Amer: >60 mL/min (ref >60)
GFR calc non Af Amer: >60 mL/min (ref >60)
Glucose, Bld: 99 mg/dL (ref 70–99)
Potassium: 3.4 mmol/L — ABNORMAL LOW (ref 3.5–5.1)
Sodium: 138 mmol/L (ref 135–145)

## 2020-07-14 LAB — CBC
HCT: 40.9 % (ref 36.0–46.0)
Hemoglobin: 12.6 g/dL (ref 12.0–15.0)
MCH: 29 pg (ref 26.0–34.0)
MCHC: 30.8 g/dL (ref 30.0–36.0)
MCV: 94.2 fL (ref 80.0–100.0)
Platelets: 92 10*3/uL — ABNORMAL LOW (ref 150–400)
RBC: 4.34 MIL/uL (ref 3.87–5.11)
RDW: 15.7 % — ABNORMAL HIGH (ref 11.5–15.5)
WBC: 3.6 10*3/uL — ABNORMAL LOW (ref 4.0–10.5)
nRBC: 0 % (ref 0.0–0.2)

## 2020-07-14 LAB — COOXEMETRY PANEL
Carboxyhemoglobin: 1.3 % (ref 0.5–1.5)
Methemoglobin: 0.6 % (ref 0.0–1.5)
O2 Saturation: 62.5 %
Total hemoglobin: 13.3 g/dL (ref 12.0–16.0)

## 2020-07-14 MED ORDER — FUROSEMIDE 10 MG/ML IJ SOLN
10.0000 mg/h | INTRAVENOUS | Status: DC
  Administered 2020-07-14: 20 mg/h via INTRAVENOUS
  Administered 2020-07-15: 10 mg/h via INTRAVENOUS
  Administered 2020-07-15: 20 mg/h via INTRAVENOUS
  Filled 2020-07-14 (×3): qty 25

## 2020-07-14 MED ORDER — POTASSIUM CHLORIDE CRYS ER 20 MEQ PO TBCR
40.0000 meq | EXTENDED_RELEASE_TABLET | Freq: Two times a day (BID) | ORAL | Status: AC
  Administered 2020-07-14 (×2): 40 meq via ORAL
  Filled 2020-07-14 (×2): qty 2

## 2020-07-14 MED ORDER — POTASSIUM CHLORIDE CRYS ER 20 MEQ PO TBCR: 20.0000 meq | EXTENDED_RELEASE_TABLET | Freq: Once | ORAL | Status: DC

## 2020-07-14 MED ORDER — DIGOXIN 125 MCG PO TABS
0.1250 mg | ORAL_TABLET | Freq: Every day | ORAL | Status: DC
  Administered 2020-07-14 – 2020-07-18 (×5): 0.125 mg via ORAL
  Filled 2020-07-14 (×5): qty 1

## 2020-07-14 MED ORDER — METOLAZONE 2.5 MG PO TABS
2.5000 mg | ORAL_TABLET | Freq: Once | ORAL | Status: AC
  Administered 2020-07-14: 2.5 mg via ORAL
  Filled 2020-07-14: qty 1

## 2020-07-14 MED ORDER — POTASSIUM CHLORIDE CRYS ER 20 MEQ PO TBCR
40.0000 meq | EXTENDED_RELEASE_TABLET | Freq: Once | ORAL | Status: AC
  Administered 2020-07-14: 40 meq via ORAL
  Filled 2020-07-14: qty 2

## 2020-07-14 MED ORDER — SPIRONOLACTONE 25 MG PO TABS
25.0000 mg | ORAL_TABLET | Freq: Every day | ORAL | Status: DC
  Administered 2020-07-15 – 2020-07-18 (×4): 25 mg via ORAL
  Filled 2020-07-14 (×4): qty 1

## 2020-07-14 NOTE — Progress Notes (Addendum)
Advanced Heart Failure Rounding Note  PCP-Cardiologist: No primary care provider on file.   Subjective:    Only 1.5L in UOP charted yesterday, despite 80 bid IV Lasix.   Wt down 6 lb since admission. Still volume overloaded, CVP 18. Co-ox 63%  SCr normal, 0.86. K 3.4   BP stable on low dose spironolactone, 123/88.    Sitting up in bed. No complaints currently.   Device interrogation yesterday demonstrated 28% RV pacing and chronic atrial fibrillation.   Objective:   Weight Range: (!) 92.1 kg Body mass index is 43.94 kg/m.   Vital Signs:   Temp:  [97.6 F (36.4 C)-98 F (36.7 C)] 97.6 F (36.4 C) (07/30 0441) Pulse Rate:  [63-82] 72 (07/30 0441) Resp:  [18-20] 18 (07/30 0800) BP: (101-119)/(77-83) 113/81 (07/30 0800) SpO2:  [97 %] 97 % (07/30 0800) Weight:  [92.1 kg] 92.1 kg (07/30 0316) Last BM Date: 07/11/20  Weight change: Filed Weights   07/13/20 0120 07/14/20 0024 07/14/20 0316  Weight: (!) 93.8 kg (!) 92.1 kg (!) 92.1 kg    Intake/Output:   Intake/Output Summary (Last 24 hours) at 07/14/2020 0938 Last data filed at 07/14/2020 0917 Gross per 24 hour  Intake 840 ml  Output 1900 ml  Net -1060 ml      Physical Exam    CVP 18 General:  Obese WF, looks much older than actual age, sitting up in bed No resp difficulty HEENT: Normal Neck: Supple. Thick neck, JVD assessment difficulty . Carotids 2+ bilat; no bruits. No lymphadenopathy or thyromegaly appreciated. Cor: PMI nondisplaced. Irregularly irregular rhythm. No rubs, gallops or murmurs. Lungs: decreased BS at the bases bilaterally  Abdomen: obese, Soft, nontender, nondistended. No hepatosplenomegaly. No bruits or masses. Good bowel sounds. Extremities: No cyanosis, clubbing, rash, 1+ bilateral LE edema up to thighs w/ chronic venous stasis dermatitis and crusting wounds. + LUE PICC  Neuro: Alert & orientedx3, cranial nerves grossly intact. moves all 4 extremities w/o difficulty. Affect  pleasant   Telemetry   afib mid 80s, occasional PVCs   EKG    No new EKG to reivew   Labs    CBC Recent Labs    07/12/20 1201 07/12/20 1201 07/13/20 0417 07/14/20 0459  WBC 4.3   < > 3.3* 3.6*  NEUTROABS 2.8  --   --   --   HGB 12.4   < > 11.7* 12.6  HCT 39.3   < > 37.1 40.9  MCV 92.9   < > 92.3 94.2  PLT 108*   < > 89* 92*   < > = values in this interval not displayed.   Basic Metabolic Panel Recent Labs    85/63/14 0417 07/14/20 0459  NA 140 138  K 3.0* 3.4*  CL 106 102  CO2 26 28  GLUCOSE 94 99  BUN 13 13  CREATININE 0.90 0.86  CALCIUM 8.8* 9.0   Liver Function Tests Recent Labs    07/12/20 1201  AST 21  ALT 12  ALKPHOS 129*  BILITOT 1.3*  PROT 7.1  ALBUMIN 3.1*   No results for input(s): LIPASE, AMYLASE in the last 72 hours. Cardiac Enzymes No results for input(s): CKTOTAL, CKMB, CKMBINDEX, TROPONINI in the last 72 hours.  BNP: BNP (last 3 results) Recent Labs    08/02/19 1618 11/08/19 1350 07/12/20 1854  BNP 988.1* 4,306.1* 1,419.2*    ProBNP (last 3 results) No results for input(s): PROBNP in the last 8760 hours.   D-Dimer No results  for input(s): DDIMER in the last 72 hours. Hemoglobin A1C No results for input(s): HGBA1C in the last 72 hours. Fasting Lipid Panel No results for input(s): CHOL, HDL, LDLCALC, TRIG, CHOLHDL, LDLDIRECT in the last 72 hours. Thyroid Function Tests No results for input(s): TSH, T4TOTAL, T3FREE, THYROIDAB in the last 72 hours.  Invalid input(s): FREET3  Other results:   Imaging    DG CHEST PORT 1 VIEW  Result Date: 07/13/2020 CLINICAL DATA:  PICC placement. EXAM: PORTABLE CHEST 1 VIEW COMPARISON:  July 12, 2020. FINDINGS: Stable cardiomegaly with central pulmonary vascular congestion. Right-sided pacemaker is unchanged in position. No pneumothorax is noted. Stable interstitial densities are noted in the mid and basilar regions bilaterally consistent with pulmonary edema. Stable bilateral pleural  effusions are noted, left greater than right. Interval placement of left-sided PICC line with distal tip in expected position of right atrium; withdrawal by 2-3 cm is recommended. Bony thorax is unremarkable. IMPRESSION: Interval placement of left-sided PICC line with distal tip in expected position of right atrium; withdrawal by 2-3 cm is recommended. Otherwise stable findings consistent with congestive heart failure. Electronically Signed   By: Lupita Raider M.D.   On: 07/13/2020 14:00   [place] EKG SITE RITE  Result Date: 07/13/2020 If Site Rite image not attached, placement could not be confirmed due to current cardiac rhythm.     Medications:     Scheduled Medications: . amiodarone  100 mg Oral Daily  . Chlorhexidine Gluconate Cloth  6 each Topical Daily  . edoxaban  60 mg Oral Q24H  . furosemide  80 mg Intravenous BID  . pantoprazole  40 mg Oral Daily  . PHENobarbital  64.8 mg Oral BID  . sodium chloride flush  10-40 mL Intracatheter Q12H  . sodium chloride flush  3 mL Intravenous Q12H  . spironolactone  12.5 mg Oral Daily     Infusions: . sodium chloride       PRN Medications:  sodium chloride, acetaminophen, sodium chloride flush, sodium chloride flush  Assessment/Plan   1. Acute on Chronic Systolic Heart Failure: due to NICM. Echo 12/06/2016 EF 10-15%. Cath 10/17 normal coronary arteries. Echo 02/2017 with LVEF 15-20% despite PVC suppression. Echo 07/2017 EF 25-30%. Echo 07/2017 LVEF 25-30%, Trivial AI, Mod MR, Severe LAE, Mild RV dilation, Mild RAE, PA peak pressure 31 mm Hg. Echo 1/20 EF 25-30% - Now NYHA Class IIIb symptoms w/ volume overload. BNP 1400. CXR w/ cardiomegaly, pleural effusions and pulmonary edema.   - Repeat Echo this admit shows EF 25-30%, moderately dilated LV, moderately dilated RV with moderately decreased RV systolic function.  This is stable. - Device interrogation shows 28% RV pacing and chronic atrial fibrillation.  - remains volume overloaded,  CVP 18. Diuresis sluggish, only 1.5 L out yesterday on 80 bid IV Lasix. SCr, K and BP stable - Continue IV Lasix 80 mg bid today.  - Give 2.5 mg of metolazone x 1 today - Increase Spiro to 25 mg daily (K 3.4)  - Try adding low dose losartan tomorrow if BP stable  - start digoxin 0.125 mg daily  - has followed with EP. Not felt to be candidate for CRT upgrade to alleviate RV pacing. QRS <130 ms.   2. Chronic AF with tachy-brady s/p pacemaker - Rate low 100s  - Continue amiodarone 100 mg daily  - add digoxin 0.125 mg daily given concomitant systolic heart failure  -Continue edoxaban 60 mg daily.Denies bleeding.H/H ok.  3. PVCs - Continue PO amiodarone  100 mg daily  - Keep K > 4.0 and Mg > 2.0  - supp K 3.4 today   4. OSA - continue CPAP qhs   5. Hypokalemia - K 3.4 - has recieved KCl 40 mEq x 1 today  - increase spiro to 25 mg   - monitor w/ diuresis    Length of Stay: 2  Robbie Lis, PA-C  07/14/2020, 9:38 AM  Advanced Heart Failure Team Pager [phone] (M-F; 7a - 4p)  Please contact [facility] for night-coverage after hours (4p -7a ) and weekends on [email or web address]  Patient seen and examined with the above-signed Advanced Practice Provider and/or Housestaff. I personally reviewed laboratory data, imaging studies and relevant notes. I independently examined the patient and formulated the important aspects of the plan. I have edited the note to reflect any of my changes or salient points. I have personally discussed the plan with the patient and/or family.  Remains volume overloaded. Only modest response to lasix 80 IV bid. CVP 18 Co-ox 63%  General:  Well appearing. No resp difficulty HEENT: normal Neck: supple. JVP to jaw. Carotids 2+ bilat; no bruits. No lymphadenopathy or thryomegaly appreciated. Cor: PMI nondisplaced. irregular rate & rhythm. No rubs, gallops or murmurs. Lungs: clear Abdomen: obese soft, nontender, nondistended. No hepatosplenomegaly. No  bruits or masses. Good bowel sounds. Extremities: no cyanosis, clubbing, rash, 1-2+ edema Neuro: alert & orientedx3, cranial nerves grossly intact. moves all 4 extremities w/o difficulty. Affect pleasant  Output looks ok but lonely modest response to IV lasix. Will add metolazone and switch to lasix gtt at 20. AF has been chronic. Have discussed CRT upgrade with EP in past to alleviate RV pacing and felt not to be a candidate. We may need to revisit. Supp K aggressively.   Arvilla Meres, MD  10:34 AM

## 2020-07-14 NOTE — Progress Notes (Signed)
Family Medicine Teaching Service Daily Progress Note Intern Pager: [phone]  [name] Medical record number: [id number] Date of birth: 1951/03/08 Age: 69 y.o. Gender: female  Primary Care Provider: Latrelle Dodrill, MD Consultants: Cardiology Code Status: DNI  Pt Overview and Major Events to Date:  Admitted 7/28  Assessment and Plan: Misty Gray a 69 y.o.femalepresenting with BLE edema with clear drainage and worsening DOE x 1 week. PMH is significant forHFrEF (EF 25-30%) 2/2 NICM, A Fib with Tachy-Brady Syndrome s/p PPM on Edoxaban, morbid obesity, OSA on CPAP, HTN, Hx Seizures.  Shortness of Breath 2/2 CHF Exaceration HFrEF(EF 25-30%) Home medication: torsemide 40mg  BID.VSS on presentation to ED. Patient satting 96-99% on room air. Urine output 1,500 mL yesterday. Cardiology consulted yesterday, recommended Lasix 80mg  BID, PICC line insertion to monitor CVP, digoxin 0.125mg , spironolactone 12.5mg . Admission weight 90.7 kg, increased to 92.1 kg today. Today patient requested to be seated up more, felt like she was "suffocating". O2 sats 97%. Exam still notable for fine crackles in bases b/l lungs. - Continue cardiology recommendations - Continue on edoxaban and amiodarone - vitals per floor protocol - cardiac monitoring - strict I/O - daily weights - AM BMP/CBC - PT/OT recs: HH with supervision - f/u blood cultures drawn in ED: no growth 1 day.  Chronic Venous Stasis Dermatitis Patient initially presented for concern for possible worsening of BLE chronic venous stasis dermatitis withclearwound drainage. Afebrile.WBC 3.6. No sign of infection. Will treat as chronic wounds and have wound care while inpatient. - wound care consult  Atrial Fibrillation Home medications: amiodarone 100mg  QD andedoxaban 60mg  QD. History of Tachy-Brady syndrome, has pacemaker in place. Cardiology decreased backup pacing rate to 50 bpm from 70 bpm to try to limit  RV pacing.  - continue home amiodarone 100mg  - continue homeedoxaban - Added Digoxin 0.125mg  daily per cardiology  Thrombocytopenia Plt 108 on admisison. 92 this AM. Appears chronic on chart review, going back as far as 2008. Have been as low as 85 previously. - monitor CBC - consider outpatient heme referral  HTN On problem list, not on home antihypertensives. BP this AM 113/81. - continue to monitor BP  Hx Seizures Well controlled on phenobarbital 64.8 mg BID. - continue home phenobarbital  OSA On CPAP at home. Used last night. - CPAP QHS  FEN/GI:heart healthy Prophylaxis:Edoxaban  Disposition: Inpatient  Subjective:  Patient slept well last night with CPAP. Asks to be seated up more as she feels as though she is "suffocating". Patient has no other complaints. No chest pains. Her right leg continues to be tender to touch.   Objective: Temp:  [97.6 F (36.4 C)-98 F (36.7 C)] 97.6 F (36.4 C) (07/30 0441) Pulse Rate:  [63-82] 72 (07/30 0441) Resp:  [18-20] 20 (07/29 2045) BP: (101-119)/(77-83) 119/83 (07/30 0441) SpO2:  [97 %] 97 % (07/29 1726) Weight:  [92.1 kg] 92.1 kg (07/30 0316) Physical Exam: General: Pleasant, awake, alert, no acute distess Cardiovascular: Irregularly irregular rhythm, normal rate.  Respiratory: Fine crackles b/l bases Abdomen: obese, non-tender Extremities: 2+ pitting edema b/l feet, legs with trace edema to knees but with chronic venous stasis dermatitis. Bandage over right lateral leg.  Laboratory: Recent Labs  Lab 07/12/20 1201 07/13/20 0417 07/14/20 0459  WBC 4.3 3.3* 3.6*  HGB 12.4 11.7* 12.6  HCT 39.3 37.1 40.9  PLT 108* 89* 92*   Recent Labs  Lab 07/12/20 1201 07/13/20 0417 07/14/20 0459  NA 138 140 138  K 4.2 3.0* 3.4*  CL 104 106 102  CO2 21* 26 28  BUN 17 13 13   CREATININE 0.99 0.90 0.86  CALCIUM 9.5 8.8* 9.0  PROT 7.1  --   --   BILITOT 1.3*  --   --   ALKPHOS 129*  --   --   ALT 12  --   --    AST 21  --   --   GLUCOSE 114* 94 99    Imaging/Diagnostic Tests: DG Chest 1 View 7/29 IMPRESSION: Interval placement of left-sided PICC line with distal tip in expected position of right atrium; withdrawal by 2-3 cm is recommended. Otherwise stable findings consistent with congestive heart failure.  8/29, DO 07/14/2020, 5:59 AM PGY-1, [facility] Family Medicine FPTS Intern pager: [phone], text pages welcome

## 2020-07-14 NOTE — Progress Notes (Signed)
Padding placed between pt's leg to minimize friction per order. Order completed.

## 2020-07-15 DIAGNOSIS — I509 Heart failure, unspecified: Secondary | ICD-10-CM | POA: Diagnosis not present

## 2020-07-15 LAB — BASIC METABOLIC PANEL
Anion gap: 10 (ref 5–15)
Anion gap: 10 (ref 5–15)
BUN: 15 mg/dL (ref 8–23)
BUN: 21 mg/dL (ref 8–23)
CO2: 32 mmol/L (ref 22–32)
CO2: 36 mmol/L — ABNORMAL HIGH (ref 22–32)
Calcium: 9.4 mg/dL (ref 8.9–10.3)
Calcium: 9.7 mg/dL (ref 8.9–10.3)
Chloride: 98 mmol/L (ref 98–111)
Chloride: 91 mmol/L — ABNORMAL LOW (ref 98–111)
Creatinine, Ser: 0.87 mg/dL (ref 0.44–1.00)
Creatinine, Ser: 1.03 mg/dL — ABNORMAL HIGH (ref 0.44–1.00)
GFR calc Af Amer: >60 mL/min (ref >60)
GFR calc Af Amer: >60 mL/min (ref >60)
GFR calc non Af Amer: >60 mL/min (ref >60)
GFR calc non Af Amer: 55 mL/min — ABNORMAL LOW (ref >60)
Glucose, Bld: 103 mg/dL — ABNORMAL HIGH (ref 70–99)
Glucose, Bld: 121 mg/dL — ABNORMAL HIGH (ref 70–99)
Potassium: 2.9 mmol/L — ABNORMAL LOW (ref 3.5–5.1)
Potassium: 3.2 mmol/L — ABNORMAL LOW (ref 3.5–5.1)
Sodium: 140 mmol/L (ref 135–145)
Sodium: 137 mmol/L (ref 135–145)

## 2020-07-15 LAB — COOXEMETRY PANEL
Carboxyhemoglobin: 1.8 % — ABNORMAL HIGH (ref 0.5–1.5)
Methemoglobin: 0.9 % (ref 0.0–1.5)
O2 Saturation: 57.4 %
Total hemoglobin: 14.7 g/dL (ref 12.0–16.0)

## 2020-07-15 LAB — MAGNESIUM: Magnesium: 2 mg/dL (ref 1.7–2.4)

## 2020-07-15 MED ORDER — LOSARTAN POTASSIUM 25 MG PO TABS
12.5000 mg | ORAL_TABLET | Freq: Every day | ORAL | Status: DC
  Administered 2020-07-15 – 2020-07-18 (×4): 12.5 mg via ORAL
  Filled 2020-07-15 (×4): qty 1

## 2020-07-15 MED ORDER — POTASSIUM CHLORIDE CRYS ER 20 MEQ PO TBCR: 40.0000 meq | EXTENDED_RELEASE_TABLET | Freq: Once | ORAL | Status: DC

## 2020-07-15 MED ORDER — POTASSIUM CHLORIDE CRYS ER 20 MEQ PO TBCR
40.0000 meq | EXTENDED_RELEASE_TABLET | Freq: Once | ORAL | Status: AC
  Administered 2020-07-15: 40 meq via ORAL
  Filled 2020-07-15: qty 2

## 2020-07-15 MED ORDER — POTASSIUM CHLORIDE CRYS ER 20 MEQ PO TBCR
40.0000 meq | EXTENDED_RELEASE_TABLET | Freq: Four times a day (QID) | ORAL | Status: AC
  Administered 2020-07-15 (×2): 40 meq via ORAL
  Filled 2020-07-15 (×3): qty 2

## 2020-07-15 NOTE — Discharge Instructions (Addendum)
You were hospitalized at [facility] due to an exacerbation of your heart failure. This improved after fluid was removed with medications. We are so glad you are feeling better.  Be sure to follow-up with your regularly scheduled heart failure appointments.  Please also be sure to follow-up with our clinic at your earliest convenience.  Thank you for allowing [place] to take care of you.  Take care, [facility] team   Atrial Fibrillation  Atrial fibrillation is a type of heartbeat that is irregular or fast. If you have this condition, your heart beats without any order. This makes it hard for your heart to pump blood in a normal way. Atrial fibrillation may come and go, or it may become a long-lasting problem. If this condition is not treated, it can put you at higher risk for stroke, heart failure, and other heart problems. What are the causes? This condition may be caused by diseases that damage the heart. They include:  High blood pressure.  Heart failure.  Heart valve disease.  Heart surgery. Other causes include:  Diabetes.  Thyroid disease.  Being overweight.  Kidney disease. Sometimes the cause is not known. What increases the risk? You are more likely to develop this condition if:  You are older.  You smoke.  You exercise often and very hard.  You have a family history of this condition.  You are a man.  You use drugs.  You drink a lot of alcohol.  You have lung conditions, such as emphysema, pneumonia, or COPD.  You have sleep apnea. What are the signs or symptoms? Common symptoms of this condition include:  A feeling that your heart is beating very fast.  Chest pain or discomfort.  Feeling short of breath.  Suddenly feeling light-headed or weak.  Getting tired easily during activity.  Fainting.  Sweating. In some cases, there are no symptoms. How is this treated? Treatment for this condition depends on underlying conditions and  how you feel when you have atrial fibrillation. They include:  Medicines to: ? Prevent blood clots. ? Treat heart rate or heart rhythm problems.  Using devices, such as a pacemaker, to correct heart rhythm problems.  Doing surgery to remove the part of the heart that sends bad signals.  Closing an area where clots can form in the heart (left atrial appendage). In some cases, your doctor will treat other underlying conditions. Follow these instructions at home: Medicines  Take over-the-counter and prescription medicines only as told by your doctor.  Do not take any new medicines without first talking to your doctor.  If you are taking blood thinners: ? Talk with your doctor before you take any medicines that have aspirin or NSAIDs, such as ibuprofen, in them. ? Take your medicine exactly as told by your doctor. Take it at the same time each day. ? Avoid activities that could hurt or bruise you. Follow instructions about how to prevent falls. ? Wear a bracelet that says you are taking blood thinners. Or, carry a card that lists what medicines you take. Lifestyle      Do not use any products that have nicotine or tobacco in them. These include cigarettes, e-cigarettes, and chewing tobacco. If you need help quitting, ask your doctor.  Eat heart-healthy foods. Talk with your doctor about the right eating plan for you.  Exercise regularly as told by your doctor.  Do not drink alcohol.  Lose weight if you are overweight.  Do not use drugs,  including cannabis. General instructions  If you have a condition that causes breathing to stop for a short period of time (apnea), treat it as told by your doctor.  Keep a healthy weight. Do not use diet pills unless your doctor says they are safe for you. Diet pills may make heart problems worse.  Keep all follow-up visits as told by your doctor. This is important. Contact a doctor if:  You notice a change in the speed, rhythm, or  strength of your heartbeat.  You are taking a blood-thinning medicine and you get more bruising.  You get tired more easily when you move or exercise.  You have a sudden change in weight. Get help right away if:   You have pain in your chest or your belly (abdomen).  You have trouble breathing.  You have side effects of blood thinners, such as blood in your vomit, poop (stool), or pee (urine), or bleeding that cannot stop.  You have any signs of a stroke. "BE FAST" is an easy way to remember the main warning signs: ? B - Balance. Signs are dizziness, sudden trouble walking, or loss of balance. ? E - Eyes. Signs are trouble seeing or a change in how you see. ? F - Face. Signs are sudden weakness or loss of feeling in the face, or the face or eyelid drooping on one side. ? A - Arms. Signs are weakness or loss of feeling in an arm. This happens suddenly and usually on one side of the body. ? S - Speech. Signs are sudden trouble speaking, slurred speech, or trouble understanding what people say. ? T - Time. Time to call emergency services. Write down what time symptoms started.  You have other signs of a stroke, such as: ? A sudden, very bad headache with no known cause. ? Feeling like you may vomit (nausea). ? Vomiting. ? A seizure. These symptoms may be an emergency. Do not wait to see if the symptoms will go away. Get medical help right away. Call your local emergency services (911 in the [place] Do not drive yourself to the hospital. Summary  Atrial fibrillation is a type of heartbeat that is irregular or fast.  You are at higher risk of this condition if you smoke, are older, have diabetes, or are overweight.  Follow your doctor's instructions about medicines, diet, exercise, and follow-up visits.  Get help right away if you have signs or symptoms of a stroke.  Get help right away if you cannot catch your breath, or you have chest pain or discomfort. This information is not  intended to replace advice given to you by your health care provider. Make sure you discuss any questions you have with your health care provider. Document Revised: 05/26/2019 Document Reviewed: 05/26/2019 Elsevier Patient Education  2020 [employer]    Heart Failure Exacerbation  Heart failure is a condition in which the heart does not fill up with enough blood, and therefore does not pump enough blood and oxygen to the body. When this happens, parts of the body do not get the blood and oxygen they need to function properly. This can cause symptoms such as breathing problems, fatigue, swelling, and confusion. Heart failure exacerbation refers to heart failure symptoms that get worse. The symptoms may get worse suddenly or develop slowly over time. Heart failure exacerbation is a serious medical problem that should be treated right away. What are the causes? A heart failure exacerbation can be triggered by:  Not  taking your heart failure medicines correctly.  Infections.  Eating an unhealthy diet or a diet that is high in salt (sodium).  Drinking too much fluid.  Drinking alcohol.  Taking illegal drugs, such as cocaine or methamphetamine.  Not exercising. Other causes include:  Other heart conditions such as an irregular heartbeat (arrhythmia).  Anemia.  Other medical problems, such as kidney failure. Sometimes the cause of the exacerbation is not known. What are the signs or symptoms? When heart failure symptoms suddenly or slowly get worse, this may be a sign of heart failure exacerbation. Symptoms of heart failure include:  Breathing problems or shortness of breath.  Chronic coughing or wheezing.  Fatigue.  Nausea or lack of appetite.  Feeling light-headed.  Confusion or memory loss.  Increased heart rate or irregular heartbeat.  Buildup of fluid in the legs, ankles, feet, or abdomen.  Difficulty breathing when lying down. How is this diagnosed? This  condition is diagnosed based on:  Your symptoms and medical history.  A physical exam. You may also have tests, including:  Electrocardiogram (ECG). This test measures the electrical activity of your heart.  Echocardiogram. This test uses sound waves to take a picture of your heart to see how well it works.  Blood tests.  Imaging tests, such as: ? Chest X-ray. ? MRI. ? Ultrasound.  Stress test. This test examines how well your heart functions when you exercise. Your heart is monitored while you exercise on a treadmill or exercise bike. If you cannot exercise, medicines may be used to increase your heartbeat in place of exercise.  Cardiac catheterization. During this test, a thin, flexible tube (catheter) is inserted into a blood vessel and threaded up to your heart. This test allows your health care provider to check the arteries that lead to your heart (coronary arteries).  Right heart catheterization. During this test, the pressure in your heart is measured. How is this treated? This condition may be treated by:  Adjusting your heart medicines.  Maintaining a healthy lifestyle. This includes: ? Eating a heart-healthy diet that is low in sodium. ? Not using any products that contain nicotine or tobacco, such as cigarettes and e-cigarettes. ? Regular exercise. ? Monitoring your fluid intake. ? Monitoring your weight and reporting changes to your health care provider.  Treating sleep apnea, if you have this condition.  Surgery. This may include: ? Implanting a device that helps both sides of your heart contract at the same time (cardiac resynchronization therapy device). This can help with heart function and relieve heart failure symptoms. ? Implanting a device that can correct heart rhythm problems (implantable cardioverter defibrillator). ? Connecting a device to your heart to help it pump blood (ventricular assist device). ? Heart transplant. Follow these instructions at  home: Medicines  Take over-the-counter and prescription medicines only as told by your health care provider.  Do not stop taking your medicines or change the amount you take. If you are having problems or side effects from your medicines, talk to your health care provider.  If you are having difficulty paying for your medicines, contact a social worker or your clinic. There are many programs to assist with medicine costs.  Talk to your health care provider before starting any new medicines or supplements.  Make sure your health care provider and pharmacist have a list of all the medicines you are taking. Eating and drinking   Avoid drinking alcohol.  Eat a heart-healthy diet as told by your health care provider.  This includes: ? Plenty of fruits and vegetables. ? Lean proteins. ? Low-fat dairy. ? Whole grains. ? Foods that are low in sodium. Activity   Exercise regularly as told by your health care provider. Balance exercise with rest.  Ask your health care provider what activities are safe for you. This includes sexual activity, exercise, and daily tasks at home or work. Lifestyle  Do not use any products that contain nicotine or tobacco, such as cigarettes and e-cigarettes. If you need help quitting, ask your health care provider.  Maintain a healthy weight. Ask your health care provider what weight is healthy for you.  Consider joining a patient support group. This can help with emotional problems you may have, such as stress and anxiety. General instructions  Talk to your health care provider about flu and pneumonia vaccines.  Keep a list of medicines that you are taking. This may help in emergency situations.  Keep all follow-up visits as told by your health care provider. This is important. Contact a health care provider if:  You have questions about your medicines or you miss a dose.  You feel anxious, depressed, or stressed.  You have swelling in your feet,  ankles, legs, or abdomen.  You have shortness of breath during activity or exercise.  You have a cough.  You have a fever.  You have trouble sleeping.  You gain 2-3 lb (1-1.4 kg) in 24 hours or 5 lb (2.3 kg) in a week. Get help right away if:  You have chest pain.  You have shortness of breath while resting.  You have severe fatigue.  You are confused.  You have severe dizziness.  You have a rapid or irregular heartbeat.  You have nausea or you vomit.  You have a cough that is worse at night or you cannot lie flat.  You have a cough that will not go away.  You have severe depression or sadness. Summary  When heart failure symptoms get worse, it is called heart failure exacerbation.  Common causes of this condition include taking medicines incorrectly, infections, and drinking alcohol.  This condition may be treated by adjusting medicines, maintaining a healthy lifestyle, or surgery.  Do not stop taking your medicines or change the amount you take. If you are having problems or side effects from your medicines, talk to your health care provider. This information is not intended to replace advice given to you by your health care provider. Make sure you discuss any questions you have with your health care provider. Document Revised: 11/14/2017 Document Reviewed: 04/15/2017 Elsevier Patient Education  2020 [employer].    Information on my medicine - SAVAYSA (edoxaban)  WHY WAS SAVAYSA PRESCRIBED FOR YOU? [name] was prescribed to treat blood clots that may have been found in the veins of your legs (deep vein thrombosis) or in your lungs (pulmonary embolism) and to reduce the risk of them occurring again and/or to prevent a stroke in the setting of afib.  WHAT DO YOU NEED TO KNOW ABOUT SAVAYSA ? Take your Savaysa ONCE DAILY - one tablet in the morning with or without food.  It would be best to take the doses about the same time each day.  If you have  difficulty swallowing the tablet whole please discuss with your pharmacist how to take the medication safely.  Take Savaysa exactly as prescribed by your doctor and DO NOT stop taking Savaysa without talking to the doctor who prescribed the medication.  Stopping may increase your  risk of developing a new clot or stroke.  Refill your prescription before you run out.  After discharge, you should have regular check-up appointments with your healthcare provider that is prescribing your Savaysa.  In the future your dose may need to be changed if your kidney function or weight changes by a significant amount or as you get older.  WHAT DO YOU DO IF YOU MISS A DOSE? If you are taking Savaysa ONCE DAILY and you miss a dose, take it as soon as you remember on the same day then continue your regularly scheduled once daily regimen the next day. Do not take two doses of Savaysa at the same time or on the same day.  IMPORTANT SAFETY INFORMATION A possible side effect of Savaysa is bleeding. You should call your healthcare provider right away if you experience any of the following: ? Bleeding from an injury or your nose that does not stop. ? Unusual colored urine (red or dark brown) or unusual colored stools (red or black). ? Unusual bruising for unknown reasons. ? A serious fall or if you hit your head (even if there is no bleeding).  Some medicines may interact with Savaysa and might increase your risk of bleeding or clotting while on Savaysa. To help avoid this, consult your healthcare provider or pharmacist prior to using any new prescription or non-prescription medications, including herbals, vitamins, non-steroidal anti-inflammatory drugs (NSAIDs) and supplements.  This website has more information on Savaysa (edoxaban): http://www.savaysa.com

## 2020-07-15 NOTE — Progress Notes (Addendum)
Family Medicine Teaching Service Daily Progress Note Intern Pager: [phone]  [name] Medical record number: [id number] Date of birth: 1951/11/06 Age: 69 y.o. Gender: female  Primary Care Provider: Latrelle Dodrill, MD Consultants: Cardiology Code Status: DNI  Pt Overview and Major Events to Date:  Admitted 7/28 7/29 PICC inserted for CVP monitoring  Assessment and Plan: Misty Gray a 69 y.o.femalepresenting with BLE edema with clear drainage and worsening DOE x 1 week. PMH is significant forHFrEF (EF 25-30%) 2/2 NICM, A Fib with Tachy-Brady Syndrome s/p PPM on Edoxaban, morbid obesity, OSA on CPAP, HTN, Hx Seizures.  Shortness of Breath 2/2 CHF Exaceration HFrEF(EF 25-30%) Patient's diuresis poor on 7/29, therefore metolazone 2.5 mg x 1 added by cardiology.  Patient started on Lasix drip at 20 mg/h on 7/30.  UOP in last 24 hours 4.3L.  Creatinine stablet at 0.87.  Cardiology also increased spironolactone to 25 mg.  Vitals remained stable overnight.  On exam crackles in LLL with stable breathing on RA.  Reports breathing "about the same." - Continue cardiology recommendations - Continue on edoxaban and amiodarone - cont spiro  - cont digoxin - cont Lasix gtt per cardiology - cardiac monitoring - strict I/O - daily weights - Check BMP at 1600 given on Lasix gtt to monitor K - AM BMP/CBC - PT/OT recs: HH with supervision, orders in - f/u blood cultures drawn in ED: NG2D  Hypokalemia K 2.9 this AM.  Mag 2.0.  In setting of Lasix gtt.  Has double lumen with Lasix gtt and CVP, therefore will replete orally. - K-Dur 40 mEq x2  Chronic Venous Stasis Dermatitis Continue to appear non-infectious.  Patient remains afebrile.  Padding placed between patient's legs to decrease friction. - wound care consult  Atrial Fibrillation Home medications: amiodarone 100mg  QD andedoxaban 60mg  QD. History of Tachy-Brady syndrome, has pacemaker in place. Cardiology  decreased backup pacing rate to 50 bpm from 70 bpm to try to limit RV pacing.  - continue home amiodarone 100mg  - continue homeedoxaban - Added Digoxin 0.125mg  daily per cardiology  Thrombocytopenia Appears chronic on chart review, going back as far as 2008. Have been as low as 85 previously.  Plt 92 on 7/30. - monitor CBC - consider outpatient heme referral  HTN On problem list, not on home antihypertensives. BP 123/79  - continue to monitor BP  Hx Seizures Well controlled on phenobarbital 64.8 mg BID. - continue home phenobarbital  OSA On CPAP at home.  - CPAP QHS  FEN/GI:heart healthy Prophylaxis:Edoxaban  Disposition: pending further diuresis and cardiology clearance, ultimately home with HH, orders in  Subjective:  Patient denies complaints this AM.  States that breathing is about the same.  States that she has been peeing a lot with Lasix.  Objective: Temp:  [97.6 F (36.4 C)-98.6 F (37 C)] 97.6 F (36.4 C) (07/31 0549) Pulse Rate:  [76-104] 91 (07/31 0551) Resp:  [17-27] 18 (07/31 0551) BP: (113-128)/(70-81) 118/73 (07/30 1320) SpO2:  [94 %-100 %] 95 % (07/31 0551)  Physical Exam:  General: 69 y.o. female in NAD, sitting up in bed Cardio: irregularly irregular Lungs: crackles LLL, no increased WOB on RA Abdomen: Soft, non-tender to palpation, non-distended Skin: warm and dry Extremities: chronic venous stasis dermatitis, new bandage over wound on right leg, 1+ BLE edema   Laboratory: Recent Labs  Lab 07/12/20 1201 07/13/20 0417 07/14/20 0459  WBC 4.3 3.3* 3.6*  HGB 12.4 11.7* 12.6  HCT 39.3 37.1 40.9  PLT  108* 89* 92*   Recent Labs  Lab 07/12/20 1201 07/12/20 1201 07/13/20 0417 07/14/20 0459 07/15/20 0347  NA 138   < > 140 138 140  K 4.2   < > 3.0* 3.4* 2.9*  CL 104   < > 106 102 98  CO2 21*   < > 26 28 32  BUN 17   < > 13 13 15   CREATININE 0.99   < > 0.90 0.86 0.87  CALCIUM 9.5   < > 8.8* 9.0 9.4  PROT 7.1  --   --   --   --    BILITOT 1.3*  --   --   --   --   ALKPHOS 129*  --   --   --   --   ALT 12  --   --   --   --   AST 21  --   --   --   --   GLUCOSE 114*   < > 94 99 103*   < > = values in this interval not displayed.    Imaging/Diagnostic Tests: DG Chest 1 View 7/29 IMPRESSION: Interval placement of left-sided PICC line with distal tip in expected position of right atrium; withdrawal by 2-3 cm is recommended. Otherwise stable findings consistent with congestive heart failure.  Delta Pichon, 8/29, DO 07/15/2020, 6:25 AM PGY-3, [facility] FPTS Intern pager: [phone], text pages welcome

## 2020-07-15 NOTE — Progress Notes (Signed)
Patient ID: Misty Gray, female   DOB: August 15, 1951, 69 y.o.   MRN: [id number]     Advanced Heart Failure Rounding Note  PCP-Cardiologist: No primary care provider on file.   Subjective:    Vigorous UOP, weight down significantly with Lasix gtt 20 mg/hr + metolazone.  K 2.9 today.  CVP down to 10 with co-ox 57%.   Device interrogation demonstrated 28% RV pacing and chronic atrial fibrillation.   Objective:   Weight Range: 84.6 kg Body mass index is 40.36 kg/m.   Vital Signs:   Temp:  [97.6 F (36.4 C)-98.6 F (37 C)] 97.6 F (36.4 C) (07/31 0549) Pulse Rate:  [76-104] 91 (07/31 0551) Resp:  [17-27] 18 (07/31 0551) BP: (118-128)/(70-73) 118/73 (07/30 1320) SpO2:  [94 %-100 %] 95 % (07/31 0551) Weight:  [84.6 kg] 84.6 kg (07/31 0626) Last BM Date: 07/13/20  Weight change: Filed Weights   07/14/20 0024 07/14/20 0316 07/15/20 0626  Weight: (!) 92.1 kg (!) 92.1 kg 84.6 kg    Intake/Output:   Intake/Output Summary (Last 24 hours) at 07/15/2020 0914 Last data filed at 07/15/2020 0653 Gross per 24 hour  Intake 1319.49 ml  Output 6150 ml  Net -4830.51 ml      Physical Exam    CVP 10 General: NAD, kyphotic Neck: JVP 9-10 cm, no thyromegaly or thyroid nodule.  Lungs: Clear to auscultation bilaterally with normal respiratory effort. CV: Nondisplaced PMI.  Heart regular S1/S2, no S3/S4, no murmur.  1+ edema to knees.   Abdomen: Soft, nontender, no hepatosplenomegaly, no distention.  Skin: Erythema lower legs.  Neurologic: Alert and oriented x 3.  Psych: Normal affect. Extremities: No clubbing or cyanosis.  HEENT: Normal.    Telemetry   afib 80s, personally reviewed  Labs    CBC Recent Labs    07/12/20 1201 07/12/20 1201 07/13/20 0417 07/14/20 0459  WBC 4.3   < > 3.3* 3.6*  NEUTROABS 2.8  --   --   --   HGB 12.4   < > 11.7* 12.6  HCT 39.3   < > 37.1 40.9  MCV 92.9   < > 92.3 94.2  PLT 108*   < > 89* 92*   < > = values in this interval not displayed.    Basic Metabolic Panel Recent Labs    59/93/57 0459 07/15/20 0347  NA 138 140  K 3.4* 2.9*  CL 102 98  CO2 28 32  GLUCOSE 99 103*  BUN 13 15  CREATININE 0.86 0.87  CALCIUM 9.0 9.4  MG  --  2.0   Liver Function Tests Recent Labs    07/12/20 1201  AST 21  ALT 12  ALKPHOS 129*  BILITOT 1.3*  PROT 7.1  ALBUMIN 3.1*   No results for input(s): LIPASE, AMYLASE in the last 72 hours. Cardiac Enzymes No results for input(s): CKTOTAL, CKMB, CKMBINDEX, TROPONINI in the last 72 hours.  BNP: BNP (last 3 results) Recent Labs    08/02/19 1618 11/08/19 1350 07/12/20 1854  BNP 988.1* 4,306.1* 1,419.2*    ProBNP (last 3 results) No results for input(s): PROBNP in the last 8760 hours.   D-Dimer No results for input(s): DDIMER in the last 72 hours. Hemoglobin A1C No results for input(s): HGBA1C in the last 72 hours. Fasting Lipid Panel No results for input(s): CHOL, HDL, LDLCALC, TRIG, CHOLHDL, LDLDIRECT in the last 72 hours. Thyroid Function Tests No results for input(s): TSH, T4TOTAL, T3FREE, THYROIDAB in the last 72 hours.  Invalid  input(s): FREET3  Other results:   Imaging    No results found.   Medications:     Scheduled Medications: . amiodarone  100 mg Oral Daily  . Chlorhexidine Gluconate Cloth  6 each Topical Daily  . digoxin  0.125 mg Oral Daily  . edoxaban  60 mg Oral Q24H  . losartan  12.5 mg Oral Daily  . pantoprazole  40 mg Oral Daily  . PHENobarbital  64.8 mg Oral BID  . potassium chloride  40 mEq Oral Q6H  . potassium chloride  40 mEq Oral Once  . sodium chloride flush  10-40 mL Intracatheter Q12H  . sodium chloride flush  3 mL Intravenous Q12H  . spironolactone  25 mg Oral Daily    Infusions: . sodium chloride    . furosemide (LASIX) infusion 20 mg/hr (07/15/20 0231)    PRN Medications: sodium chloride, acetaminophen, sodium chloride flush, sodium chloride flush  Assessment/Plan   1. Acute on chronic systolic CHF: Nonischemic  cardiomyopathy.  Echo today with EF 25-30%, moderate RV dysfunction.  Stable from prior.  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 Johney Frame in 2019.  RV pacing 28% of the time by device interrogation this admission.  NYHA class IIIb-IV at admission.  Very limited GDMT due to inability to tolerate HF meds.  Now with PICC, CVP down to 10 with aggressive diuresis yesterday, co-ox 57%.   - Can decrease Lasix to 10 mg/hr today and replace K.  Repeat BMET in pm.  Likely to po diuretic tomorrow.  - Continue digoxin 0.125 daily.  - Continue spironolactone 25 mg daily.  - Add losartan 12.5 mg daily.   - Decreased backup pacing rate to 50 bpm from 70 bpm to try to limit RV pacing.  2. Atrial fibrillation: Chronic.  She is on edoxaban.  Atria are large and she has been in atrial fibrillation for a prolonged period of time, unlikely to successfully cardiovert.  3. PVCs: She is on amiodarone to suppress PVCs.  PVCs appear rare on telemetry review.  Continue amiodarone.  4. OSA: CPAP.   Marca Ancona 07/15/2020 9:18 AM

## 2020-07-15 NOTE — Progress Notes (Signed)
Family Medicine Teaching Service Daily Progress Note Intern Pager: [phone]  [name] Medical record number: [id number] Date of birth: 1951/04/30 Age: 70 y.o. Gender: female  Primary Care Provider: Latrelle Dodrill, MD Consultants: Cardiology Code Status: Partial (DNI)  Pt Overview and Major Events to Date:  Admitted 7/28 7/29 PICC inserted for CVP monitoring  Assessment and Plan: Misty Gray a 69 y.o.femalepresenting with BLE edema with clear drainage and worsening DOE x 1 week. PMH is significant forHFrEF (EF 25-30%) 2/2 NICM, A Fib with Tachy-Brady Syndrome s/p PPM on Edoxaban, morbid obesity, OSA on CPAP, HTN, Hx Seizures.  Acute on Chronic combined HFrEF with EF 25%:  PICC in place to monitor CVP. Cardiology monitoring and adjusting regimen as indicated. Received IV Lasix of 10mg /hr overnight plus started Losartan. UOP -4.35 overnight, with a net of -10.2L. Wt down from 84.kg>83.1kg overnight. Cr improved from 1.03>0.93. Exam with fine bibasilar crackles, trace LE edema. VSS overnight. Currently on 2L O2 with good O2 sats. Appears she is supposed to be on 2L O2 at home but has not had tank filled in some time. Patient notes endorses comfortable breathing and persistent cough. - Cardiology consulted, appreciate assessment and care plan - Continue lasix dtt per cardiology, considering transition to PO today -Continue home edoxaban and amiodarone - Continue Spironolactone 25mg  QD, Digoxin, Losartan 25mg  QD - strict I/O's, daily weights - cardiac monitoring, tele - 1600 BMP to monitor electrolytes  - AM BMP, CBC - PT/OT recs: HH with supervision, orders in - f/u blood cultures drawn in ED: NGTD - may need to be discharged on O2 if unable to wean  Electrolyte Abnormalities: Secondary to diuresis. K 3.1 this AM. - continue to replete - Kdur q8 hours   Chronic Atrial Fibrillation Home medications:amiodarone 100mg  QD andedoxaban 60mg  QD.  History of Tachy-Brady syndrome, has pacemaker in place. Cardiology managing pacer to assist with rate control and RV pacing.  - continuehome amiodarone 100mg  - continuehomeedoxaban - Continue Digoxin 0.125mg  daily per cardiology  Chronic Venous Stasis Dermatitis Continue to appear non-infectious.  Patient remains afebrile.  Padding placed between patient's legs to decrease friction. - wound care consult  Thrombocytopenia Chronic. Unclear etiology.   Plt 92 on 7/30. NO further work up at this time. Continue to monitor and evaluate if declines significantly. - monitor CBC - consider outpatient heme referral  HTN On problem list, not onhomeantihypertensives. BP 96/83. - continue to monitor BP  Hx Seizures Well controlled on phenobarbital 64.8 mg BID. - continuehome phenobarbital  OSA On CPAP at home.  - CPAP QHS  FEN/GI:heart healthy Prophylaxis:Edoxaban  Disposition: pending diuresis  Subjective:  No acute events overnight. Patient reports breathing is more comfortable but she still has a cough that makes her very out of breath. Nothing seems to help the cough. She notes she used to be on 2L home O2 but her tank ran out and hasnt been on it in some time.   Objective: Temp:  [97.5 F (36.4 C)-98.2 F (36.8 C)] 97.5 F (36.4 C) (08/01 0446) Pulse Rate:  [48-104] 48 (08/01 0450) Resp:  [13-20] 16 (08/01 0450) BP: (89-101)/(58-71) 89/67 (07/31 1936) SpO2:  [71 %-100 %] 100 % (08/01 0450) Weight:  [83.1 kg-84.6 kg] 83.1 kg (08/01 0511) Physical Exam: General: pleasant older female, lying comfortably in bed on 2L O2 CV: systolic murmur, regular rate and rhythm, trace to 1+ LE edema Lungs: fine bibasilar crackles in lower lung fields, otherwise clear, normal work of  breathing on 2L Abdomen: soft, non-tender Skin: warm, dry, chronic venous stasis dermatitis, bandage on RLL clean, dry and intact, trace to 1+ BLE edema Extremities: warm and well perfused Neuro:  speech normal  Laboratory: Recent Labs  Lab 07/13/20 0417 07/14/20 0459 07/16/20 0440  WBC 3.3* 3.6* 4.3  HGB 11.7* 12.6 13.1  HCT 37.1 40.9 40.9  PLT 89* 92* 111*   Recent Labs  Lab 07/12/20 1201 07/13/20 0417 07/15/20 0347 07/15/20 1548 07/16/20 0440  NA 138   < > 140 137 137  K 4.2   < > 2.9* 3.2* 3.1*  CL 104   < > 98 91* 93*  CO2 21*   < > 32 36* 35*  BUN 17   < > 15 21 23   CREATININE 0.99   < > 0.87 1.03* 0.93  CALCIUM 9.5   < > 9.4 9.7 9.2  PROT 7.1  --   --   --   --   BILITOT 1.3*  --   --   --   --   ALKPHOS 129*  --   --   --   --   ALT 12  --   --   --   --   AST 21  --   --   --   --   GLUCOSE 114*   < > 103* 121* 106*   < > = values in this interval not displayed.    Imaging/Diagnostic Tests: No results found.   [place]arn[place], DO 07/16/2020, 5:42 AM PGY-3, [facility] FPTS Intern pager: [phone], text pages welcome

## 2020-07-15 NOTE — TOC Initial Note (Addendum)
Transition of Care [facility]) - Initial/Assessment Note    Patient Details  Name: Misty Gray MRN: [id number] Date of Birth: 07-18-1951  Transition of [facility]) CM/SW Contact:    Norina Buzzard, RN Phone Number: 07/15/2020, 1:02 PM  Clinical Narrative: 69 yo F presented with BLE edema with clear drainage and worsening. PMH of HFrEF (EF 25-30%) 2/2 NICM, A Fib with Tachy-Brady Syndrome s/p PPM on Edoxaban, morbid obesity, OSA on CPAP, HTN, and seizures.        PT is recommending HHPT. Met with pt. She plans to return home with the support of her husband, son, grandson, and nephew. She reports that she has good family support. She doesn't drive. Her husband provides transportation. She has RW, cane and a 3-in-1 BSC. She denies any issues buying her meds.   Discussed HHPT. She reports that she doesn't have a preference for a [place] agency. Provided pt with a CMS Medicare compare list. She chose [facility]. Contacted Georgina Snell with [facility] for referral.   Expected Discharge Plan: [place] Barriers to Discharge: No Barriers Identified   Patient Goals and CMS Choice Patient states their goals for this hospitalization and ongoing recovery are:: to get better CMS Medicare.gov Compare Post Acute Care list provided to:: Patient Choice offered to / list presented to : Patient  Expected Discharge Plan and Services Expected Discharge Plan: [place]   Discharge Planning Services: CM Consult Post Acute Care Choice: [place] arrangements for the past 2 months: Single Family Home                         Representative spoke with at DME Agency: Georgina Snell HH Arranged: PT, OT [place] Agency: [place] Date [place]: 07/15/20 Time [place]: 41 Representative spoke with at [place]: Georgina Snell  Prior Living Arrangements/Services Living arrangements for the past 2 months: [place] with:: Adult Children,  Spouse, Other (Comment) Patient language and need for interpreter reviewed:: Yes Do you feel safe going back to the place where you live?: Yes               Activities of Daily Living Home Assistive Devices/Equipment: Gilford Rile (specify type) (pt states she refuses to use) ADL Screening (condition at time of admission) Patient's cognitive ability adequate to safely complete daily activities?: Yes Is the patient deaf or have difficulty hearing?: No Does the patient have difficulty seeing, even when wearing glasses/contacts?: No Does the patient have difficulty concentrating, remembering, or making decisions?: No Patient able to express need for assistance with ADLs?: Yes Does the patient have difficulty dressing or bathing?: No Independently performs ADLs?: Yes (appropriate for developmental age) Does the patient have difficulty walking or climbing stairs?: Yes Weakness of Legs: Both Weakness of Arms/Hands: None  Permission Sought/Granted Permission sought to share information with : Case Manager                Emotional Assessment Appearance:: Developmentally appropriate, Well-Groomed Attitude/Demeanor/Rapport: Engaged, Self-Confident Affect (typically observed): Accepting, Stable, Pleasant Orientation: : Oriented to Self, Oriented to Place, Oriented to  Time, Oriented to Situation      Admission diagnosis:  CHF exacerbation ([place]) [I50.9] Acute on chronic congestive heart failure, unspecified heart failure type [facility]) [I50.9] Patient Active Problem List   Diagnosis Date Noted  . CHF exacerbation ([place]) 07/12/2020  . Hospital discharge follow-up 11/17/2019  . Onychomycosis 11/17/2019  . Sepsis due  to pneumonia ([place]) 11/08/2019  . Abdominal wall hernia 11/05/2017  . Anticoagulated 11/05/2017  . Rash and nonspecific skin eruption 09/15/2017  . Ventral hernia without obstruction or gangrene 08/15/2017  . Skin irritation 07/23/2017  . Chronic venous stasis dermatitis  12/26/2016  . Urinary tract infection without hematuria   . Frequent PVCs   . Hypokalemia   . Housing problems 11/23/2016  . Chronic atrial fibrillation ([place]) 11/01/2016  . Hypotension 10/31/2016  . Special screening for malignant neoplasms, colon 08/12/2016  . Heme positive stool 08/09/2016  . Intertrigo 08/09/2016  . Chronic anticoagulation 04/26/2016  . Chronic venous insufficiency   . Preventative health care 10/05/2014  . Breast mass, left 10/05/2014  . Osteopenia 09/15/2014  . Hypertension 01/04/2014  . Chronic systolic dysfunction of left ventricle 05/05/2013  . At high risk for falls 02/11/2013  . Vitamin D deficiency 07/15/2012  . Sinoatrial node dysfunction ([place]) 05/08/2011  . Pacemaker 08/10/2009  . Allergic rhinitis 05/31/2008  . Morbid obesity ([place]) 02/12/2007  . GASTROESOPHAGEAL REFLUX, NO ESOPHAGITIS 02/12/2007  . Female stress incontinence 02/12/2007  . Seizure disorder ([place]) 02/12/2007  . OSA (obstructive sleep apnea) 02/12/2007   PCP:  [name], MD Pharmacy:   [place], [place] - [address] [place] [address] Phone: [phone] Fax: [phone]     Social Determinants of Health (SDOH) Interventions    Readmission Risk Interventions No flowsheet data found.

## 2020-07-15 NOTE — Progress Notes (Signed)
Pt has refused CPAP at this time. She stated she would call if she wants it.

## 2020-07-16 DIAGNOSIS — I509 Heart failure, unspecified: Secondary | ICD-10-CM | POA: Diagnosis not present

## 2020-07-16 LAB — RENAL FUNCTION PANEL
Albumin: 3 g/dL — ABNORMAL LOW (ref 3.5–5.0)
Anion gap: 6 (ref 5–15)
BUN: 25 mg/dL — ABNORMAL HIGH (ref 8–23)
CO2: 36 mmol/L — ABNORMAL HIGH (ref 22–32)
Calcium: 9.5 mg/dL (ref 8.9–10.3)
Chloride: 93 mmol/L — ABNORMAL LOW (ref 98–111)
Creatinine, Ser: 1.01 mg/dL — ABNORMAL HIGH (ref 0.44–1.00)
GFR calc Af Amer: >60 mL/min (ref >60)
GFR calc non Af Amer: 56 mL/min — ABNORMAL LOW (ref >60)
Glucose, Bld: 122 mg/dL — ABNORMAL HIGH (ref 70–99)
Phosphorus: 3.9 mg/dL (ref 2.5–4.6)
Potassium: 4 mmol/L (ref 3.5–5.1)
Sodium: 135 mmol/L (ref 135–145)

## 2020-07-16 LAB — CBC
HCT: 40.9 % (ref 36.0–46.0)
Hemoglobin: 13.1 g/dL (ref 12.0–15.0)
MCH: 29.6 pg (ref 26.0–34.0)
MCHC: 32 g/dL (ref 30.0–36.0)
MCV: 92.3 fL (ref 80.0–100.0)
Platelets: 111 10*3/uL — ABNORMAL LOW (ref 150–400)
RBC: 4.43 MIL/uL (ref 3.87–5.11)
RDW: 15.4 % (ref 11.5–15.5)
WBC: 4.3 10*3/uL (ref 4.0–10.5)
nRBC: 0 % (ref 0.0–0.2)

## 2020-07-16 LAB — BASIC METABOLIC PANEL
Anion gap: 9 (ref 5–15)
BUN: 23 mg/dL (ref 8–23)
CO2: 35 mmol/L — ABNORMAL HIGH (ref 22–32)
Calcium: 9.2 mg/dL (ref 8.9–10.3)
Chloride: 93 mmol/L — ABNORMAL LOW (ref 98–111)
Creatinine, Ser: 0.93 mg/dL (ref 0.44–1.00)
GFR calc Af Amer: >60 mL/min (ref >60)
GFR calc non Af Amer: >60 mL/min (ref >60)
Glucose, Bld: 106 mg/dL — ABNORMAL HIGH (ref 70–99)
Potassium: 3.1 mmol/L — ABNORMAL LOW (ref 3.5–5.1)
Sodium: 137 mmol/L (ref 135–145)

## 2020-07-16 LAB — COOXEMETRY PANEL
Carboxyhemoglobin: 1.7 % — ABNORMAL HIGH (ref 0.5–1.5)
Methemoglobin: 1 % (ref 0.0–1.5)
O2 Saturation: 72.1 %
Total hemoglobin: 13.8 g/dL (ref 12.0–16.0)

## 2020-07-16 LAB — MAGNESIUM: Magnesium: 2 mg/dL (ref 1.7–2.4)

## 2020-07-16 MED ORDER — TORSEMIDE 20 MG PO TABS
60.0000 mg | ORAL_TABLET | Freq: Two times a day (BID) | ORAL | Status: DC
  Administered 2020-07-16 – 2020-07-18 (×4): 60 mg via ORAL
  Filled 2020-07-16 (×5): qty 3

## 2020-07-16 MED ORDER — CARBAMIDE PEROXIDE 6.5 % OT SOLN
5.0000 [drp] | Freq: Two times a day (BID) | OTIC | Status: DC
  Administered 2020-07-16 – 2020-07-18 (×3): 5 [drp] via OTIC
  Filled 2020-07-16: qty 15

## 2020-07-16 MED ORDER — POTASSIUM CHLORIDE CRYS ER 20 MEQ PO TBCR
40.0000 meq | EXTENDED_RELEASE_TABLET | Freq: Three times a day (TID) | ORAL | Status: DC
  Administered 2020-07-16 – 2020-07-18 (×8): 40 meq via ORAL
  Filled 2020-07-16 (×8): qty 2

## 2020-07-16 NOTE — Progress Notes (Signed)
Patient ID: Misty Gray, female   DOB: November 27, 1951, 69 y.o.   MRN: [id number]     Advanced Heart Failure Rounding Note  PCP-Cardiologist: No primary care provider on file.   Subjective:    Good UOP again, weight down 3 lbs.  Remains on Lasix gtt 10 mg/hr.  CVP 8.  Creatinine stable.  Co-ox 72%.  SBP 80s-100s.    Device interrogation demonstrated 28% RV pacing and chronic atrial fibrillation.   Objective:   Weight Range: 83.1 kg Body mass index is 39.64 kg/m.   Vital Signs:   Temp:  [97.5 F (36.4 C)-98.2 F (36.8 C)] 97.9 F (36.6 C) (08/01 0900) Pulse Rate:  [48-85] 81 (08/01 1054) Resp:  [13-20] 18 (08/01 0900) BP: (89-102)/(58-88) 102/88 (08/01 0900) SpO2:  [71 %-100 %] 98 % (08/01 0900) Weight:  [83.1 kg] 83.1 kg (08/01 0511) Last BM Date: 07/15/20  Weight change: Filed Weights   07/14/20 0316 07/15/20 0626 07/16/20 0511  Weight: (!) 92.1 kg 84.6 kg 83.1 kg    Intake/Output:   Intake/Output Summary (Last 24 hours) at 07/16/2020 1101 Last data filed at 07/16/2020 0900 Gross per 24 hour  Intake 1020.68 ml  Output 3950 ml  Net -2929.32 ml      Physical Exam    CVP 8 General: NAD, kyphotic Neck: JVP 8 cm, no thyromegaly or thyroid nodule.  Lungs: Decreased at bases.  CV: Nondisplaced PMI.  Heart regular S1/S2, no S3/S4, no murmur. 1+ edema to knees.   Abdomen: Soft, nontender, no hepatosplenomegaly, no distention.  Skin: Erythema lower legs Neurologic: Alert and oriented x 3.  Psych: Normal affect. Extremities: No clubbing or cyanosis.  HEENT: Normal.    Telemetry   afib 80s, personally reviewed  Labs    CBC Recent Labs    07/14/20 0459 07/16/20 0440  WBC 3.6* 4.3  HGB 12.6 13.1  HCT 40.9 40.9  MCV 94.2 92.3  PLT 92* 111*   Basic Metabolic Panel Recent Labs    84/16/60 0347 07/15/20 0347 07/15/20 1548 07/16/20 0440  NA 140   < > 137 137  K 2.9*   < > 3.2* 3.1*  CL 98   < > 91* 93*  CO2 32   < > 36* 35*  GLUCOSE 103*   < > 121*  106*  BUN 15   < > 21 23  CREATININE 0.87   < > 1.03* 0.93  CALCIUM 9.4   < > 9.7 9.2  MG 2.0  --   --   --    < > = values in this interval not displayed.   Liver Function Tests No results for input(s): AST, ALT, ALKPHOS, BILITOT, PROT, ALBUMIN in the last 72 hours. No results for input(s): LIPASE, AMYLASE in the last 72 hours. Cardiac Enzymes No results for input(s): CKTOTAL, CKMB, CKMBINDEX, TROPONINI in the last 72 hours.  BNP: BNP (last 3 results) Recent Labs    08/02/19 1618 11/08/19 1350 07/12/20 1854  BNP 988.1* 4,306.1* 1,419.2*    ProBNP (last 3 results) No results for input(s): PROBNP in the last 8760 hours.   D-Dimer No results for input(s): DDIMER in the last 72 hours. Hemoglobin A1C No results for input(s): HGBA1C in the last 72 hours. Fasting Lipid Panel No results for input(s): CHOL, HDL, LDLCALC, TRIG, CHOLHDL, LDLDIRECT in the last 72 hours. Thyroid Function Tests No results for input(s): TSH, T4TOTAL, T3FREE, THYROIDAB in the last 72 hours.  Invalid input(s): FREET3  Other results:  Imaging    No results found.   Medications:     Scheduled Medications: . amiodarone  100 mg Oral Daily  . Chlorhexidine Gluconate Cloth  6 each Topical Daily  . digoxin  0.125 mg Oral Daily  . edoxaban  60 mg Oral Q24H  . losartan  12.5 mg Oral Daily  . pantoprazole  40 mg Oral Daily  . PHENobarbital  64.8 mg Oral BID  . potassium chloride  40 mEq Oral Q8H  . sodium chloride flush  10-40 mL Intracatheter Q12H  . sodium chloride flush  3 mL Intravenous Q12H  . spironolactone  25 mg Oral Daily  . torsemide  60 mg Oral BID    Infusions: . sodium chloride      PRN Medications: sodium chloride, acetaminophen, sodium chloride flush, sodium chloride flush  Assessment/Plan   1. Acute on chronic systolic CHF: Nonischemic cardiomyopathy.  Echo today with EF 25-30%, moderate RV dysfunction.  Stable from prior.  She has a Secondary school teacher PPM for tachy-brady  syndrome, in the past CRT upgrade was entertained but pacing only around 2% of the time when she saw Dr. Johney Frame in 2019.  RV pacing 28% of the time by device interrogation this admission.  NYHA class IIIb-IV at admission.  Very limited GDMT due to inability to tolerate HF meds.  Now with PICC, CVP down to 8 with diuresis, co-ox 72%.   - Stop IV Lasix, start torsemide 60 mg bid this evening (was on 40 mg bid at home prior).   - Continue digoxin 0.125 daily.  - Continue spironolactone 25 mg daily.  - Continue losartan 12.5 mg daily, no BP room to increase.  - Decreased backup pacing rate to 50 bpm from 70 bpm to try to limit RV pacing.  2. Atrial fibrillation: Chronic.  She is on edoxaban.  Atria are large and she has been in atrial fibrillation for a prolonged period of time, unlikely to successfully cardiovert.  3. PVCs: She is on amiodarone to suppress PVCs.  PVCs appear rare on telemetry review.  Continue amiodarone.  4. OSA: CPAP.   Marca Ancona 07/16/2020 11:01 AM

## 2020-07-16 NOTE — Progress Notes (Signed)
RT set up CPAP and attempted x 2 to place patient on the  CPAP and was unable to do so. RT informed RN to call when patient is ready for CPAP

## 2020-07-16 NOTE — Progress Notes (Signed)
SATURATION QUALIFICATIONS: (This note is used to comply with regulatory documentation for home oxygen)  Patient Saturations on Room Air at Rest = 83%  Patient Saturations on 2 Liters of oxygen while at rest in chair= 98%  Please briefly explain why patient needs home oxygen: nurse attempted to wean pt to room, however there is episodes of desaturation while pt is sitting up. When place in oxygen 2L [place] patient's oxygen saturation goes up to 98% in about 3 minutes.

## 2020-07-16 NOTE — Progress Notes (Signed)
Pt refused cpap

## 2020-07-16 NOTE — Progress Notes (Signed)
Pt is currently RA with oxygen saturation of 97%.  Will cont. To monitor pt.

## 2020-07-16 NOTE — Progress Notes (Signed)
Family Medicine Teaching Service Daily Progress Note Intern Pager: [phone]  [name] Medical record number: [id number] Date of birth: 29-Jun-1951 Age: 69 y.o. Gender: female  Primary Care Provider: Latrelle Dodrill, MD Consultants: Cardiology Code Status: DNI  Pt Overview and Major Events to Date:  Admitted 7/28 7/29 PICC inserted for CVP monitoring  Assessment and Plan: SHAMS FILL a 69 y.o.femalepresenting with BLE edema with clear drainage and worsening DOE x 1 week. PMH is significant forHFrEF (EF 25-30%) 2/2 NICM, A Fib with Tachy-Brady Syndrome s/p PPM on Edoxaban, morbid obesity, OSA on CPAP, HTN, Hx Seizures.  Acute on Chronic combined HFrEF with EF 25%:  PICC in place to monitor CVP. Cardiology monitoring and adjusting regimen as indicated. Discontinued IV Lasix, started on Torsemide 60mg  BID yesterday. UOP 1,382mL overnight, with a net of -349.41mL. Wt 90.7 on admission, down to 84 kg today. Cr 0.93>1.01>1.28, likely increase due to aggressive diuresis, will continue to monitor. On exam, pitting edema in b/l feet is much improved, now trace to 1+. Continues to have crackles in lungs, R>L. Currently on 2L [place] O2.  - Cardiology consulted, appreciate assessment and care plan - Continue Torsemide 60mg  BID - Continue home edoxaban and amiodarone - Continue Spironolactone 25mg  QD, Digoxin, Losartan 25mg  QD - strict I/O's, daily weights - cardiac monitoring, tele - AM BMP, monitor creatinine  - PT/OT recs: HH with supervision, orders in - f/u blood cultures drawn in ED: NGTD - may need to be discharged on O2 if unable to wean  Electrolyte Abnormalities: stable, improved Secondary to diuresis. K 3.9 this AM. - continue to replete - Kdur 11m q8 hours   Chronic Atrial Fibrillation Home medications:amiodarone 100mg  QD andedoxaban 60mg  QD. History of Tachy-Brady syndrome, has pacemaker in place. Cardiology managing pacer to assist with rate control  and RV pacing. Per cardiology, unlikely to successfully cardiovert given chronic nature. - continuehome amiodarone 100mg  - continuehomeedoxaban - Continue Digoxin 0.125mg  daily per cardiology  Chronic Venous Stasis Dermatitis Continue to appear non-infectious. Bandage to RLE changed this AM. Patient remains afebrile.  Padding placed between patient's legs to decrease friction. - wound care consult  Thrombocytopenia Chronic. Unclear etiology. Plt 101 this AM. NO further work up at this time. Continue to monitor and evaluate if declines significantly. - monitor CBC  - consider outpatient heme referral  Hx Seizures Well controlled on phenobarbital 64.8 mg BID. - continuehome phenobarbital  OSA On CPAP at home.  - CPAP QHS  FEN/GI:heart healthy Prophylaxis:Edoxaban  Disposition: pending diuresis, cards recommendations   Subjective:  Patient overall feels well this AM. Continues to pee a lot. States that her feet may fit in her shoes now that they are less swollen. No chest pain. Continues to have some SOB. Was on 2L [place] this AM, states that she took her CPAP off around 4-6AM so that they could take vitals.   Objective: Temp:  [97.5 F (36.4 C)-98.4 F (36.9 C)] 98.4 F (36.9 C) (08/01 1937) Pulse Rate:  [48-111] 111 (08/01 1937) Resp:  [13-20] 20 (08/01 1937) BP: (84-102)/(73-88) 84/73 (08/01 1937) SpO2:  [98 %-100 %] 99 % (08/01 1937) Weight:  [83.1 kg] 83.1 kg (08/01 0511) Physical Exam: General: Alert, no acute distress Cardiovascular: Irregularly irregular rhythm  Respiratory: fine crackles b/l bases, R>L Abdomen: obese, non-tender in all quadrants Extremities: Trace edema to knees, RLE with new bandage placed today, padding between thighs to protect skin from friction rub, RUE PICC   Laboratory: Recent Labs  Lab 07/13/20 0417 07/14/20 0459 07/16/20 0440  WBC 3.3* 3.6* 4.3  HGB 11.7* 12.6 13.1  HCT 37.1 40.9 40.9  PLT 89* 92* 111*   Recent Labs   Lab 07/12/20 1201 07/13/20 0417 07/15/20 1548 07/16/20 0440 07/16/20 1455  NA 138   < > 137 137 135  K 4.2   < > 3.2* 3.1* 4.0  CL 104   < > 91* 93* 93*  CO2 21*   < > 36* 35* 36*  BUN 17   < > 21 23 25*  CREATININE 0.99   < > 1.03* 0.93 1.01*  CALCIUM 9.5   < > 9.7 9.2 9.5  PROT 7.1  --   --   --   --   BILITOT 1.3*  --   --   --   --   ALKPHOS 129*  --   --   --   --   ALT 12  --   --   --   --   AST 21  --   --   --   --   GLUCOSE 114*   < > 121* 106* 122*   < > = values in this interval not displayed.      Imaging/Diagnostic Tests: None new  Sabino Dick, DO 07/16/2020, 8:15 PM PGY-1, [facility] Family Medicine FPTS Intern pager: [phone], text pages welcome

## 2020-07-17 LAB — CBC
HCT: 37.8 % (ref 36.0–46.0)
Hemoglobin: 11.9 g/dL — ABNORMAL LOW (ref 12.0–15.0)
MCH: 29.4 pg (ref 26.0–34.0)
MCHC: 31.5 g/dL (ref 30.0–36.0)
MCV: 93.3 fL (ref 80.0–100.0)
Platelets: 101 10*3/uL — ABNORMAL LOW (ref 150–400)
RBC: 4.05 MIL/uL (ref 3.87–5.11)
RDW: 15.2 % (ref 11.5–15.5)
WBC: 4.1 10*3/uL (ref 4.0–10.5)
nRBC: 0 % (ref 0.0–0.2)

## 2020-07-17 LAB — CULTURE, BLOOD (ROUTINE X 2)
Culture: NO GROWTH
Culture: NO GROWTH
Special Requests: ADEQUATE

## 2020-07-17 LAB — COOXEMETRY PANEL
Carboxyhemoglobin: 1.5 % (ref 0.5–1.5)
Methemoglobin: 1 % (ref 0.0–1.5)
O2 Saturation: 71.4 %
Total hemoglobin: 12.7 g/dL (ref 12.0–16.0)

## 2020-07-17 LAB — BASIC METABOLIC PANEL
Anion gap: 5 (ref 5–15)
BUN: 28 mg/dL — ABNORMAL HIGH (ref 8–23)
CO2: 34 mmol/L — ABNORMAL HIGH (ref 22–32)
Calcium: 8.6 mg/dL — ABNORMAL LOW (ref 8.9–10.3)
Chloride: 97 mmol/L — ABNORMAL LOW (ref 98–111)
Creatinine, Ser: 1.28 mg/dL — ABNORMAL HIGH (ref 0.44–1.00)
GFR calc Af Amer: 49 mL/min — ABNORMAL LOW (ref >60)
GFR calc non Af Amer: 42 mL/min — ABNORMAL LOW (ref >60)
Glucose, Bld: 93 mg/dL (ref 70–99)
Potassium: 3.9 mmol/L (ref 3.5–5.1)
Sodium: 136 mmol/L (ref 135–145)

## 2020-07-17 NOTE — Progress Notes (Addendum)
Patient ID: Misty Gray, female   DOB: 1951/05/20, 69 y.o.   MRN: [id number]     Advanced Heart Failure Rounding Note  PCP-Cardiologist: No primary care provider on file.   Subjective:    Overall excellent diuresis this admit. I/Os net negative 10.5L.  Wt down 21 lb. CVP 8. Co-ox 71%.   Slight bump in SCr 0.93>>1.01>>1.28. Now off IV Lasix and on PO torsemide.   No complaints today, sitting up in bed eating breakfast.   Device interrogation demonstrated 28% RV pacing and chronic atrial fibrillation.   Objective:   Weight Range: 84 kg Body mass index is 40.08 kg/m.   Vital Signs:   Temp:  [97.6 F (36.4 C)-98.4 F (36.9 C)] 97.6 F (36.4 C) (08/02 0600) Pulse Rate:  [60-111] 60 (08/02 0400) Resp:  [15-20] 16 (08/02 0400) BP: (84-102)/(55-88) 96/55 (08/02 0400) SpO2:  [98 %-99 %] 98 % (08/02 0400) Weight:  [84 kg] 84 kg (08/02 0600) Last BM Date: 07/16/20  Weight change: Filed Weights   07/15/20 0626 07/16/20 0511 07/17/20 0600  Weight: 84.6 kg 83.1 kg 84 kg    Intake/Output:   Intake/Output Summary (Last 24 hours) at 07/17/2020 0804 Last data filed at 07/17/2020 0622 Gross per 24 hour  Intake 1000.59 ml  Output 1350 ml  Net -349.41 ml      Physical Exam    CVP 8 General: elderly, moderately obese WF, sitting up in bed. No distress Neck: JVP 8 cm, no thyromegaly or thyroid nodule.  Lungs: slightly decreased BS at the bases, no wheezing  CV: Nondisplaced PMI.  irregularly irregular rhythm, regular rate no murmur. Trace bilateral LEE w/ chronic venous stasis dermatitis  Abdomen: obese, soft, nontender, no hepatosplenomegaly, no distention.  Neurologic: Alert and oriented x 3.  Psych: Pleasant, Normal affect. Extremities: No clubbing or cyanosis. + RUE PICC  HEENT: Normal.    Telemetry   afib 70s, 4 beat run NSVT personally reviewed  Labs    CBC Recent Labs    07/16/20 0440 07/17/20 0500  WBC 4.3 4.1  HGB 13.1 11.9*  HCT 40.9 37.8  MCV 92.3 93.3   PLT 111* 101*   Basic Metabolic Panel Recent Labs    34/74/25 0347 07/15/20 1548 07/16/20 1455 07/17/20 0500  NA 140   < > 135 136  K 2.9*   < > 4.0 3.9  CL 98   < > 93* 97*  CO2 32   < > 36* 34*  GLUCOSE 103*   < > 122* 93  BUN 15   < > 25* 28*  CREATININE 0.87   < > 1.01* 1.28*  CALCIUM 9.4   < > 9.5 8.6*  MG 2.0  --  2.0  --   PHOS  --   --  3.9  --    < > = values in this interval not displayed.   Liver Function Tests Recent Labs    07/16/20 1455  ALBUMIN 3.0*   No results for input(s): LIPASE, AMYLASE in the last 72 hours. Cardiac Enzymes No results for input(s): CKTOTAL, CKMB, CKMBINDEX, TROPONINI in the last 72 hours.  BNP: BNP (last 3 results) Recent Labs    08/02/19 1618 11/08/19 1350 07/12/20 1854  BNP 988.1* 4,306.1* 1,419.2*    ProBNP (last 3 results) No results for input(s): PROBNP in the last 8760 hours.   D-Dimer No results for input(s): DDIMER in the last 72 hours. Hemoglobin A1C No results for input(s): HGBA1C in the last 72  hours. Fasting Lipid Panel No results for input(s): CHOL, HDL, LDLCALC, TRIG, CHOLHDL, LDLDIRECT in the last 72 hours. Thyroid Function Tests No results for input(s): TSH, T4TOTAL, T3FREE, THYROIDAB in the last 72 hours.  Invalid input(s): FREET3  Other results:   Imaging    No results found.   Medications:     Scheduled Medications: . amiodarone  100 mg Oral Daily  . carbamide peroxide  5 drop Both EARS BID  . Chlorhexidine Gluconate Cloth  6 each Topical Daily  . digoxin  0.125 mg Oral Daily  . edoxaban  60 mg Oral Q24H  . losartan  12.5 mg Oral Daily  . pantoprazole  40 mg Oral Daily  . PHENobarbital  64.8 mg Oral BID  . potassium chloride  40 mEq Oral Q8H  . sodium chloride flush  10-40 mL Intracatheter Q12H  . sodium chloride flush  3 mL Intravenous Q12H  . spironolactone  25 mg Oral Daily  . torsemide  60 mg Oral BID    Infusions: . sodium chloride      PRN Medications: sodium  chloride, acetaminophen, sodium chloride flush, sodium chloride flush  Assessment/Plan   1. Acute on chronic systolic CHF: Nonischemic cardiomyopathy.  Echo this admit with EF 25-30%, moderate RV dysfunction.  Stable from prior.  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 Johney Frame in 2019.  RV pacing 28% of the time by device interrogation this admission.  NYHA class IIIb-IV at admission.  Very limited GDMT due to inability to tolerate HF meds.  Excellent diuresis w/ IV Lasix, overall down 21 lb.  CVP down to 8 with diuresis, co-ox 71%. Now off IV diuretics  - Continue PO torsemide 60 mg bid  (was on 40 mg bid at home prior).   - Continue digoxin 0.125 daily.  - Continue spironolactone 25 mg daily.  - Continue losartan 12.5 mg daily, no BP room to increase.  - Decreased backup pacing rate to 50 bpm from 70 bpm to try to limit RV pacing.  2. Atrial fibrillation: Chronic.  She is on edoxaban.  Atria are large and she has been in atrial fibrillation for a prolonged period of time, unlikely to successfully cardiovert.  3. PVCs: She is on amiodarone to suppress PVCs.  PVCs appear rare on telemetry review.  Continue amiodarone.  4. OSA: CPAP.  5. AKI - SCr bump w/ diuresis, 0.93>>1.01>>1.28 - she is now off IV diuretics. On PO torsemide - follow BMP   Robbie Lis, PA-C 07/17/2020 8:04 AM  Agree with the above note.    She is stable now on po torsemide.  CVP 8.  Co-ox 71%.  We will continue current meds today.  Should be ready soon for discharge.  Mild increase in creatinine, would follow.   Marca Ancona 07/17/2020

## 2020-07-17 NOTE — Care Management Important Message (Signed)
Important Message  Patient Details  Name: Misty Gray MRN: [id number] Date of Birth: 1951-09-25   Medicare Important Message Given:  Yes     Renie Ora 07/17/2020, 11:08 AM

## 2020-07-17 NOTE — Progress Notes (Signed)
Patient refused CPAP and said she feels fine without it. RT will continue to monitor.

## 2020-07-17 NOTE — Progress Notes (Signed)
SATURATION QUALIFICATIONS: (This note is used to comply with regulatory documentation for home oxygen)  Patient Saturations on Room Air at Rest = 83%  Patient Saturations on Room Air while Ambulating = Nt as desats on RA at rest.  Patient Saturations on 2 Liters of oxygen while Ambulating = 98%  Please briefly explain why patient needs home oxygen:Pt needs 2LO2 with activity and at rest to keep sats >90%. Garnetta Fedrick W,PT Acute Rehabilitation Services Pager:  [phone]  Office:  [phone]

## 2020-07-17 NOTE — Progress Notes (Addendum)
Family Medicine Teaching Service Daily Progress Note Intern Pager: [phone]  [name] Medical record number: [id number] Date of birth: [birth year] Age: 69 y.o. Gender: female  Primary Care Provider: Latrelle Dodrill, MD Consultants: HF Code Status: DNI  Pt Overview and Major Events to Date:  Admitted 7/28 PICC for CVP monitoring 7/29  Assessment and Plan: Misty Gray a 69 y.o.femalepresenting with BLE edema with clear drainage and worsening DOE x 1 week. PMH is significant forHFrEF (EF 25-30%) 2/2 NICM, A Fib with Tachy-Brady Syndrome s/p PPM on Edoxaban, morbid obesity, OSA on CPAP, HTN, Hx Seizures.  Acute on Chronic combined HFrEF with EF 25%: stable, improving PICC in place to monitor CVP. Cardiology monitoring and adjusting regimen as indicated. Patient continues on Torsemide 60mg  BID.UOP1631mL overnight, with a net of -11.959L since admission.Wt stable.Cr improved this morning,0.93>1.01>1.28>0.93, the initial increase likely due to aggressive IV diuresis. On exam patient continues to have crackles in lowe bases, some scarce wheezing in upper lobes, patient breathing comfortably on 2L [place], no accessory muscle use. Lower extremity edema is improved in b/l feet, trace edema only. - Cardiologyconsulted, appreciateassessment and care plan - Continue Torsemide 60mg  BID, will adjust as recommended by cardiology - Continue home edoxaban and amiodarone - Continue Spironolactone 25mg  QD, Digoxin, Losartan 25mg  QD, will adjust as necessary pending cardiology recommendations - strict I/O's, daily weights - cardiac monitoring, tele - AM BMP, monitor creatinine  - PT/OT recs: HH with supervision, orders in - blood cultures drawn in ED:NG in 5 days. - Patient will need to be d/c with home oxygen, saturation on room air decreased to 83% per PT note yesterday, was able to ambulate and maintain O2 98% on 2L oxygen.  Electrolyte Abnormalities: stable,  resolved Secondary to diuresis.K3.8this AM. - continue to replete - Kdur 80m q8 hours  ChronicAtrial Fibrillation: stable Home medications:amiodarone 100mg  QD andedoxaban 60mg  QD. History of Tachy-Brady syndrome, has pacemaker in place.Cardiologymanaging pacer to assist with rate control and RV pacing.Per cardiology, unlikely to successfully cardiovert given chronic nature. - continuehome amiodarone 100mg  - continuehomeedoxaban - ContinueDigoxin 0.125mg  daily per cardiology  Chronic Venous Stasis Dermatitis: stable Continue to appear non-infectious. Bandage to RLE changed this AM.Patient remains afebrile. Padding placed between patient's legs to decrease friction. - wound care consult  Thrombocytopenia Chronic. Unclear etiology.Plt 101 this AM. NO further work up at this time. Continue to monitor and evaluate if declines significantly. - monitor CBC   - consider outpatient heme referral  Hx Seizures Well controlled on phenobarbital 64.8 mg BID. - continuehome phenobarbital  OSA Patient refused CPAP last night, per note she "felt fine without it". On CPAP at home.  - Will continue to offer CPAP QHS  FEN/GI:heart healthy Prophylaxis:Edoxaban  Disposition:pending diuresis, cards recommendations   Subjective:  Patient is seen up in recliner this morning eating breakfast. States that she slept well yesterday despite now wearing CPAP. She continues to feel some SOB and some pain in her left leg. She does not want any pain medication for her leg because she states she can handle it. No other complaints or concerns.  Objective: Temp:  [97.6 F (36.4 C)-98 F (36.7 C)] 98 F (36.7 C) (08/02 2003) Pulse Rate:  [54-72] 54 (08/02 2003) Resp:  [15-20] 18 (08/02 2003) BP: (96-98)/(47-72) 97/61 (08/02 2003) SpO2:  [97 %-99 %] 97 % (08/02 2003) Weight:  [84 kg] 84 kg (08/02 0600) Physical Exam: General: awake, alert, eating in recliner Cardiovascular:  irregularly irregular Respiratory: on 2L  [place], crackles lower bases b/l lungs, scarce wheezing upper lobes b/l Abdomen: obese Extremities: chronic venous dermatitis b/l legs, trace edema b/l feet   Laboratory: Recent Labs  Lab 07/14/20 0459 07/16/20 0440 07/17/20 0500  WBC 3.6* 4.3 4.1  HGB 12.6 13.1 11.9*  HCT 40.9 40.9 37.8  PLT 92* 111* 101*   Recent Labs  Lab 07/12/20 1201 07/13/20 0417 07/16/20 0440 07/16/20 1455 07/17/20 0500  NA 138   < > 137 135 136  K 4.2   < > 3.1* 4.0 3.9  CL 104   < > 93* 93* 97*  CO2 21*   < > 35* 36* 34*  BUN 17   < > 23 25* 28*  CREATININE 0.99   < > 0.93 1.01* 1.28*  CALCIUM 9.5   < > 9.2 9.5 8.6*  PROT 7.1  --   --   --   --   BILITOT 1.3*  --   --   --   --   ALKPHOS 129*  --   --   --   --   ALT 12  --   --   --   --   AST 21  --   --   --   --   GLUCOSE 114*   < > 106* 122* 93   < > = values in this interval not displayed.    Imaging/Diagnostic Tests: None new  Sabino Dick, DO 07/17/2020, 9:20 PM PGY-1, [facility] Family Medicine FPTS Intern pager: [phone], text pages welcome

## 2020-07-17 NOTE — Telephone Encounter (Signed)
Patient is currently hospitalized. I am waiting until she is discharged to refill these medications in case her medications change upon discharge.

## 2020-07-17 NOTE — Progress Notes (Addendum)
Occupational Therapy Treatment Patient Details Name: Misty Gray MRN: [id number] DOB: May 31, 1951 Today's Date: 07/17/2020    History of present illness Pt is a 69 y.o. female presenting with BLE edema with clear drainage and worsening DOE x1 week. PMH significant for afib, pacemaker, OSA on CPAP, CHF, CKD III, and seizure disorder.   OT comments  Pt making gradual progress towards OT goals. Pt completing functional transfers, room level mobility using RW, and standing grooming ADL with minguard assist, requiring minA for toileting ADL. Pt initially on 2LO2 with SpO2 >90%, but requiring up to 4L O2 to maintain SpO2 >/=88% with standing activity. Pt denies dyspnea during activity. Feel POC remains appropriate at thsi time. Will continue to follow while acutely admitted.   Follow Up Recommendations  Home health OT;Supervision - Intermittent    Equipment Recommendations  None recommended by OT          Precautions / Restrictions Precautions Precautions: Fall Restrictions Weight Bearing Restrictions: No       Mobility Bed Mobility               General bed mobility comments: OOB in recliner on entry  Transfers Overall transfer level: Needs assistance Equipment used: Rolling walker (2 wheeled) Transfers: Sit to/from [employer] Sit to Stand: Min guard Stand pivot transfers: Min guard       General transfer comment: for balance and safety, increased time/effort but no physical assist required, stood from recliner and from [facility] [place]     Balance Overall balance assessment: Needs assistance Sitting-balance support: Feet supported;Single extremity supported Sitting balance-Leahy Scale: Good     Standing balance support: Bilateral upper extremity supported;During functional activity Standing balance-Leahy Scale: Fair                             ADL either performed or assessed with clinical judgement   ADL Overall ADL's : Needs  assistance/impaired Eating/Feeding: Modified independent;Sitting Eating/Feeding Details (indicate cue type and reason): pt finishing breakfast initially when checked on this AM Grooming: Min guard;Standing;Wash/dry hands Grooming Details (indicate cue type and reason): close minguard for standing balance                  Toilet Transfer: Min guard;Stand-pivot;BSC;RW   Toileting- Clothing Manipulation and Hygiene: Minimal assistance;Sitting/lateral lean;Sit to/from stand Toileting - Clothing Manipulation Details (indicate cue type and reason): assist for standing balance while pt performing pericare      Functional mobility during ADLs: Min guard;Rolling walker                         Cognition Arousal/Alertness: Awake/alert Behavior During Therapy: WFL for tasks assessed/performed Overall Cognitive Status: Within Functional Limits for tasks assessed                                          Exercises     Shoulder Instructions       General Comments pt in afib with HR grossly in the upper 60s-90s overall, pt initially on2L O2 with SpO2 >90%, ultimately required 4L to maintain SpO2 >/=88% with standing activity     Pertinent Vitals/ Pain       Pain Assessment: No/denies pain  Home Living  Prior Functioning/Environment              Frequency  Min 2X/week        Progress Toward Goals  OT Goals(current goals can now be found in the care plan section)  Progress towards OT goals: Progressing toward goals  Acute Rehab OT Goals Patient Stated Goal: To return home.  OT Goal Formulation: With patient Time For Goal Achievement: 07/27/20 Potential to Achieve Goals: Good ADL Goals Pt Will Perform Grooming: with modified independence;standing Pt Will Perform Lower Body Bathing: with modified independence;sitting/lateral leans;sit to/from stand;with adaptive equipment Pt Will  Perform Lower Body Dressing: with modified independence;sitting/lateral leans;sit to/from stand;with adaptive equipment Pt Will Transfer to Toilet: with modified independence;ambulating;bedside commode Pt Will Perform Toileting - Clothing Manipulation and hygiene: with modified independence;sitting/lateral leans;sit to/from stand Additional ADL Goal #1: Patient will recall 3 energy conservation techniques in prep for BADLs.  Plan Discharge plan remains appropriate    Co-evaluation                 AM-PAC OT "6 Clicks" Daily Activity     Outcome Measure   Help from another person eating meals?: A Little Help from another person taking care of personal grooming?: A Little Help from another person toileting, which includes using toliet, bedpan, or urinal?: A Little Help from another person bathing (including washing, rinsing, drying)?: A Little Help from another person to put on and taking off regular upper body clothing?: A Little Help from another person to put on and taking off regular lower body clothing?: A Lot 6 Click Score: 17    End of Session Equipment Utilized During Treatment: Rolling walker;Oxygen  OT Visit Diagnosis: Unsteadiness on feet (R26.81);Muscle weakness (generalized) (M62.81)   Activity Tolerance Patient tolerated treatment well   Patient Left Other (comment) (handoff to PT for treatment session)   Nurse Communication Mobility status        Time: 1610-9604 OT Time Calculation (min): 18 min  Charges: OT General Charges $OT Visit: 1 Visit OT Treatments $Self Care/Home Management : 8-22 mins  Marcy Siren, OT Acute Rehabilitation Services Pager [phone] Office [phone]   Orlando Penner 07/17/2020, 11:16 AM

## 2020-07-17 NOTE — Progress Notes (Signed)
Physical Therapy Treatment Patient Details Name: Misty Gray MRN: [id number] DOB: 03/03/1951 Today's Date: 07/17/2020    History of Present Illness Pt is a 69 y.o. female presenting with BLE edema with clear drainage and worsening DOE x1 week. PMH significant for afib, pacemaker, OSA on CPAP, CHF, CKD III, and seizure disorder.    PT Comments    Pt admitted with above diagnosis. Pt was able to ambulate with min guard assist with RW with good stability overall. Needs 2LO2 to maintain sats >90%. Will follow acutely.   Pt currently with functional limitations due to balance and endurance deficits. Pt will benefit from skilled PT to increase their independence and safety with mobility to allow discharge to the venue listed below.     Follow Up Recommendations  Home health PT;Supervision for mobility/OOB     Equipment Recommendations  None recommended by PT    Recommendations for Other Services       Precautions / Restrictions Precautions Precautions: Fall Restrictions Weight Bearing Restrictions: No    Mobility  Bed Mobility               General bed mobility comments: Standingup with OT after just getting off toilet.    Transfers Overall transfer level: Needs assistance Equipment used: Rolling walker (2 wheeled) Transfers: Sit to/from [employer] Sit to Stand: Min guard         General transfer comment: for balance and safety, inceased time/effort but no physical assist required, stood from [facility]   Ambulation/Gait Ambulation/Gait assistance: Min guard Gait Distance (Feet): 250 Feet Assistive device: Rolling walker (2 wheeled) Gait Pattern/deviations: Step-through pattern;Decreased step length - right;Decreased step length - left;Shuffle;Trunk flexed;Wide base of support Gait velocity: slowed Gait velocity interpretation: <1.31 ft/sec, indicative of household ambulator General Gait Details: min guard for safety, for mildly unsteady gait, increased  work of breathing with progression    Stairs             Wheelchair Mobility    Modified Rankin (Stroke Patients Only)       Balance Overall balance assessment: Needs assistance Sitting-balance support: Feet supported;Single extremity supported Sitting balance-Leahy Scale: Good     Standing balance support: Bilateral upper extremity supported;During functional activity Standing balance-Leahy Scale: Fair Standing balance comment: Able to maintain standing balance with UE and occasional  external support.                             Cognition Arousal/Alertness: Awake/alert Behavior During Therapy: WFL for tasks assessed/performed Overall Cognitive Status: Within Functional Limits for tasks assessed                                        Exercises General Exercises - Lower Extremity Ankle Circles/Pumps: AROM;Both;10 reps;Supine Long Arc Quad: AROM;Both;10 reps;Seated    General Comments General comments (skin integrity, edema, etc.): Pt sats 83% on RA at rest.  Placed 2LO2 and sats 98%.        Pertinent Vitals/Pain Pain Assessment: No/denies pain    Home Living                      Prior Function            PT Goals (current goals can now be found in the care plan section) Acute Rehab PT Goals Patient Stated Goal:  To return home.  Progress towards PT goals: Progressing toward goals    Frequency    Min 3X/week      PT Plan Current plan remains appropriate    Co-evaluation              AM-PAC PT "6 Clicks" Mobility   Outcome Measure  Help needed turning from your back to your side while in a flat bed without using bedrails?: A Little Help needed moving from lying on your back to sitting on the side of a flat bed without using bedrails?: A Little Help needed moving to and from a bed to a chair (including a wheelchair)?: None Help needed standing up from a chair using your arms (e.g., wheelchair or bedside  chair)?: None Help needed to walk in hospital room?: A Little Help needed climbing 3-5 steps with a railing? : A Little 6 Click Score: 20    End of Session Equipment Utilized During Treatment: Gait belt;Oxygen Activity Tolerance: Patient limited by fatigue Patient left: in chair;with call bell/phone within reach;with chair alarm set Nurse Communication: Mobility status PT Visit Diagnosis: Unsteadiness on feet (R26.81);Other abnormalities of gait and mobility (R26.89);Muscle weakness (generalized) (M62.81);Difficulty in walking, not elsewhere classified (R26.2)     Time: 8889-1694 PT Time Calculation (min) (ACUTE ONLY): 14 min  Charges:  $Gait Training: 8-22 mins                     Malak Orantes W,PT Acute Rehabilitation Services Pager:  [phone]  Office:  [phone]     Berline Lopes 07/17/2020, 3:47 PM

## 2020-07-18 ENCOUNTER — Encounter (HOSPITAL_COMMUNITY): Payer: Self-pay | Admitting: Family Medicine

## 2020-07-18 ENCOUNTER — Telehealth: Payer: Self-pay

## 2020-07-18 DIAGNOSIS — I482 Chronic atrial fibrillation, unspecified: Secondary | ICD-10-CM

## 2020-07-18 LAB — CBC
HCT: 39.7 % (ref 36.0–46.0)
Hemoglobin: 12.4 g/dL (ref 12.0–15.0)
MCH: 29.1 pg (ref 26.0–34.0)
MCHC: 31.2 g/dL (ref 30.0–36.0)
MCV: 93.2 fL (ref 80.0–100.0)
Platelets: 101 10*3/uL — ABNORMAL LOW (ref 150–400)
RBC: 4.26 MIL/uL (ref 3.87–5.11)
RDW: 15.3 % (ref 11.5–15.5)
WBC: 4.2 10*3/uL (ref 4.0–10.5)
nRBC: 0 % (ref 0.0–0.2)

## 2020-07-18 LAB — BASIC METABOLIC PANEL
Anion gap: 5 (ref 5–15)
BUN: 32 mg/dL — ABNORMAL HIGH (ref 8–23)
CO2: 32 mmol/L (ref 22–32)
Calcium: 8.9 mg/dL (ref 8.9–10.3)
Chloride: 101 mmol/L (ref 98–111)
Creatinine, Ser: 0.93 mg/dL (ref 0.44–1.00)
GFR calc Af Amer: >60 mL/min (ref >60)
GFR calc non Af Amer: >60 mL/min (ref >60)
Glucose, Bld: 91 mg/dL (ref 70–99)
Potassium: 3.8 mmol/L (ref 3.5–5.1)
Sodium: 138 mmol/L (ref 135–145)

## 2020-07-18 LAB — COOXEMETRY PANEL
Carboxyhemoglobin: 1.2 % (ref 0.5–1.5)
Methemoglobin: 0.7 % (ref 0.0–1.5)
O2 Saturation: 67.9 %
Total hemoglobin: 12.9 g/dL (ref 12.0–16.0)

## 2020-07-18 MED ORDER — LOSARTAN POTASSIUM 25 MG PO TABS: 12.5000 mg | ORAL_TABLET | Freq: Every day | ORAL | 0 refills | Status: DC

## 2020-07-18 MED ORDER — DIGOXIN 125 MCG PO TABS: 0.1250 mg | ORAL_TABLET | Freq: Every day | ORAL | 0 refills | Status: DC

## 2020-07-18 MED ORDER — SPIRONOLACTONE 25 MG PO TABS: 25.0000 mg | ORAL_TABLET | Freq: Every day | ORAL | 0 refills | Status: DC

## 2020-07-18 MED ORDER — TORSEMIDE 20 MG PO TABS: 60.0000 mg | ORAL_TABLET | Freq: Two times a day (BID) | ORAL | 0 refills | Status: DC

## 2020-07-18 MED ORDER — AMIODARONE HCL 200 MG PO TABS: 100.0000 mg | ORAL_TABLET | Freq: Every day | ORAL | 3 refills | Status: DC

## 2020-07-18 NOTE — Discharge Summary (Addendum)
Family Medicine Teaching [facility] Discharge Summary  [name] Medical record number: [id number] Date of birth: 12/06/1951 Age: 69 y.o. Gender: female Date of Admission: 07/12/2020  Date of Discharge: 07/18/20 Admitting Physician: Nestor Ramp, MD  Primary Care Provider: Latrelle Dodrill, MD Consultants: HF  Indication for Hospitalization:  Acute on Chronic combined HFrEF with EF25%  Discharge Diagnoses/Problem List:  Acute on Chronic combined HFrEF with EF25% Hypokalemia- resolved Chronic Atrial Fibrillation  Chronic Venous Stasis Dermatitis  Thrombocytopenia OSA History of Seizures  Disposition: Home  Discharge Condition: Stable  Discharge Exam:  Temp:  [97.6 F (36.4 C)-98 F (36.7 C)] 98 F (36.7 C) (08/02 2003) Pulse Rate:  [54-72] 54 (08/02 2003) Resp:  [15-20] 18 (08/02 2003) BP: (96-98)/(47-72) 97/61 (08/02 2003) SpO2:  [97 %-99 %] 97 % (08/02 2003) Weight:  [84 kg] 84 kg (08/02 0600) Physical Exam: General: awake, alert, eating in recliner Cardiovascular: irregularly irregular Respiratory: on 2L [place], crackles lower bases b/l lungs, scarce wheezing upper lobes b/l Abdomen: obese Extremities: chronic venous dermatitis b/l legs, trace edema b/l feet   Brief Hospital Course:  Misty Gray is a 69 y.o. female who presented with BLE edema with clear drainage and worsening DOE. PMH is significant for HFrEF (EF 25-30%) 2/2 NICM, A Fib with Tachy-Brady Syndrome s/p PPM on Edoxaban, morbid obesity, OSA on CPAP, HTN, Hx Seizures. Below is her hospital course listed by problem. Refer to H&P for additional information.   CHF Exacerbation CXR noteable for increasing cardiomegaly, pleural effusions and pulmonary edema. BNP elevated to 1419. Echo showed unchanged EF of 25-30% but additionally significant for severely elevated pulmonary pressures, RA pressure 60.79mmHg, dilated atria R>L, dilated IVC, mild to moderate mitral regurgitation and moderate  tricuspid valve regurgitation. Received IV Lasix 80mg  BID with good urine output. Cardiology consulted and spironolactone 12.5 added daily and Losartan 12.5mg . PICC line was placed to follow PVC's. Patient with great UOP and weight decrease with IV lasix so was transitioned to PO Torsemide 60mg  BID. Mild elevation in creatinine  1.01>1.28 but decreased to 0.93 after transitioning to oral diuretics. Patient supplemeted with KCl 40 mEq TID. Total net diuresis -11.9L. Oxygen saturation stable but did require 2L [place] supplemental oxygen.   Chronic Venous Stasis Dermatitis  Chronic venous stasis dermatitis in bilateral lower extremities. Lateral aspect RLE with approximately 1x0.5cm open wound, initially with clear drainage. WBC within normal limits, VSS, no signs of infection. Wound care consultation placed, kept dry and covered with bandage.   Atrial Fibrillation: chronic, stable Continued on home medications: amiodarone 100 QD and edoxaban 60mg  QD. Per cardiology, patient has been in atrial fibrillation for a prolonged period of time and not recommendations, digoxin 0.125 was added daily. Device interrogated, RV pacing 28% of time.   Other conditions chronic and stable.   Issues for Follow Up:  1. Appears to have chronic thrombocytopenia that could be worked up outpatient with peripheral smear, heme referral. 2. Follow up outpatient with cardiology for management of heart failure  3. Monitor BP as new medications started: Losartan, Spironolactone   Significant Procedures:  Echo 7/29 IMPRESSIONS  1. Left ventricular ejection fraction, by estimation, is 25 to 30%. The  left ventricle has severely decreased function. The left ventricle  demonstrates global hypokinesis. The left ventricular internal cavity size  was severely dilated. Left ventricular  diastolic parameters are indeterminate.   2. Right ventricular systolic function is moderately reduced. The right  ventricular size is moderately  enlarged.  There is severely elevated  pulmonary artery systolic pressure. The estimated right ventricular  systolic pressure is 60.2 mmHg.   3. Left atrial size was moderately dilated.   4. Right atrial size was severely dilated.   5. The mitral valve is abnormal. Thickened leaflets. Mild to moderate  mitral valve regurgitation.   6. Tricuspid valve regurgitation is moderate.   7. The aortic valve was not well visualized. Aortic valve regurgitation  is not visualized. Mild aortic valve sclerosis is present, with no  evidence of aortic valve stenosis.   8. The inferior vena cava is dilated in size with <50% respiratory  variability, suggesting right atrial pressure of 15 mmHg.   Significant Labs and Imaging:  Recent Labs  Lab 07/16/20 0440 07/17/20 0500 07/18/20 0600  WBC 4.3 4.1 4.2  HGB 13.1 11.9* 12.4  HCT 40.9 37.8 39.7  PLT 111* 101* 101*   Recent Labs  Lab 07/12/20 1201 07/13/20 0417 07/15/20 0347 07/15/20 0347 07/15/20 1548 07/15/20 1548 07/16/20 0440 07/16/20 0440 07/16/20 1455 07/16/20 1455 07/17/20 0500 07/18/20 0600  NA 138   < > 140   < > 137  --  137  --  135  --  136 138  K 4.2   < > 2.9*   < > 3.2*   < > 3.1*   < > 4.0   < > 3.9 3.8  CL 104   < > 98   < > 91*  --  93*  --  93*  --  97* 101  CO2 21*   < > 32   < > 36*  --  35*  --  36*  --  34* 32  GLUCOSE 114*   < > 103*   < > 121*  --  106*  --  122*  --  93 91  BUN 17   < > 15   < > 21  --  23  --  25*  --  28* 32*  CREATININE 0.99   < > 0.87   < > 1.03*  --  0.93  --  1.01*  --  1.28* 0.93  CALCIUM 9.5   < > 9.4   < > 9.7  --  9.2  --  9.5  --  8.6* 8.9  MG  --   --  2.0  --   --   --   --   --  2.0  --   --   --   PHOS  --   --   --   --   --   --   --   --  3.9  --   --   --   ALKPHOS 129*  --   --   --   --   --   --   --   --   --   --   --   AST 21  --   --   --   --   --   --   --   --   --   --   --   ALT 12  --   --   --   --   --   --   --   --   --   --   --   ALBUMIN 3.1*  --   --   --    --   --   --   --  3.0*  --   --   --    < > =  values in this interval not displayed.     Results/Tests Pending at Time of Discharge: None  Discharge Medications:  Allergies as of 07/18/2020       Reactions   Aspirin Hives, Rash   Penicillins Rash, Other (See Comments)   Has patient had a PCN reaction causing immediate rash, facial/tongue/throat swelling, SOB or lightheadedness with hypotension: Yes Has patient had a PCN reaction causing severe rash involving mucus membranes or skin necrosis: No Has patient had a PCN reaction that required hospitalization No Has patient had a PCN reaction occurring within the last 10 years: Yes If all of the above answers are "NO", then may proceed with Cephalosporin use. Patient has tolerated cephalosporins in 2017.   Tape Other (See Comments)   NO PAPER TAPE-MUST BE MEDICAL TAPE - unknown reaction NO PAPER TAPE-MUST BE MEDICAL TAPE - unknown reaction   Wool Alcohol [lanolin] Hives   Amoxicillin Rash   Patient has tolerated cephalosporins   Ampicillin Itching, Rash   Patient has tolerated cephalosporins        Medication List     TAKE these medications    amiodarone 200 MG tablet Commonly known as: PACERONE Take 0.5 tablets (100 mg total) by mouth daily. What changed: how much to take   cholecalciferol 25 MCG (1000 UNIT) tablet Commonly known as: VITAMIN D TAKE 1 TABLET BY MOUTH ONCE DAILY.   digoxin 0.125 MG tablet Commonly known as: LANOXIN Take 1 tablet (0.125 mg total) by mouth daily. Start taking on: July 19, 2020   edoxaban 60 MG Tabs tablet Commonly known as: SAVAYSA Take 60 mg by mouth daily.   fluticasone 50 MCG/ACT nasal spray Commonly known as: FLONASE Place 2 sprays into both nostrils daily as needed. For congestion.   losartan 25 MG tablet Commonly known as: COZAAR Take 0.5 tablets (12.5 mg total) by mouth daily. Start taking on: July 19, 2020   omeprazole 20 MG capsule Commonly known as: PRILOSEC TAKE  (1) CAPSULE BY MOUTH ONCE DAILY. What changed: See the new instructions.   PHENobarbital 64.8 MG tablet Commonly known as: LUMINAL TAKE 1 TABLET BY MOUTH TWICE DAILY What changed:  how much to take how to take this when to take this additional instructions   potassium chloride 10 MEQ tablet Commonly known as: KLOR-CON TAKE 4 TABLETS BY MOUTH THREE TIMES DAILY (MORNING, NOON, AND NIGHT) What changed: See the new instructions.   spironolactone 25 MG tablet Commonly known as: ALDACTONE Take 1 tablet (25 mg total) by mouth daily. Start taking on: July 19, 2020   torsemide 20 MG tablet Commonly known as: DEMADEX Take 3 tablets (60 mg total) by mouth 2 (two) times daily. What changed: See the new instructions.   triamcinolone cream 0.5 % Commonly known as: KENALOG Apply topically 3 (three) times daily.               Durable Medical Equipment  (From admission, onward)           Start     Ordered   07/18/20 1030  For home use only DME oxygen  Once       Question Answer Comment  Length of Need Lifetime   Mode or (Route) Nasal cannula   Liters per Minute 2   Frequency Continuous (stationary and portable oxygen unit needed)   Oxygen conserving device Yes   Oxygen delivery system Gas      07/18/20 1030   07/18/20 0000  For home use only DME oxygen  Question Answer Comment  Length of Need Lifetime   Mode or (Route) Nasal cannula   Liters per Minute 2   Frequency Continuous (stationary and portable oxygen unit needed)   Oxygen conserving device Yes   Oxygen delivery system Gas      07/18/20 1345            Discharge Instructions: Please refer to Patient Instructions section of EMR for full details.  Patient was counseled important signs and symptoms that should prompt return to medical care, changes in medications, dietary instructions, activity restrictions, and follow up appointments.     Sabino Dick, DO 07/18/2020, 2:07 PM PGY-1, [facility]   FPTS Upper-Level Resident Addendum I have independently interviewed and examined the patient. I have discussed the above with the original author and agree with their documentation. Please see also any attending notes.    Peggyann Shoals, DO PGY-2, [facility] 07/19/2020 11:10 AM  FPTS Service pager: [phone] (text pages welcome through [facility])

## 2020-07-18 NOTE — Progress Notes (Signed)
D/C instructions given and reviewed. Questions answered. Tele removed. Awaiting IV team to remove PICC line.

## 2020-07-18 NOTE — Progress Notes (Addendum)
Patient ID: [name], female   DOB: Jul 18, 1951, 69 y.o.   MRN: [id number]     Advanced Heart Failure Rounding Note  PCP-Cardiologist: No primary care provider on file.   Subjective:    Overall excellent diuresis this admit. I/Os net negative 11.7L.  Wt down 21 lb. CVP 9. Co-ox 68%.   AKI resolved. SCr back down to 0.93.    Sitting up in chair. No complaints this am. Feels better.   Device interrogation demonstrated 28% RV pacing and chronic atrial fibrillation.   Objective:    Weight Range: 83.9 kg Body mass index is 40.03 kg/m.   Vital Signs:   Temp:  [97.9 F (36.6 C)-98.8 F (37.1 C)] 98.5 F (36.9 C) (08/03 0400) Pulse Rate:  [54-71] 54 (08/02 2003) Resp:  [18-20] 18 (08/03 0400) BP: (94-105)/(47-61) 94/59 (08/03 0400) SpO2:  [96 %-97 %] 96 % (08/03 0400) Weight:  [83.9 kg] 83.9 kg (08/03 0626) Last BM Date: 07/17/20  Weight change: Filed Weights   07/16/20 0511 07/17/20 0600 07/18/20 0626  Weight: 83.1 kg 84 kg 83.9 kg    Intake/Output:   Intake/Output Summary (Last 24 hours) at 07/18/2020 0828 Last data filed at 07/18/2020 0806 Gross per 24 hour  Intake 360 ml  Output 1600 ml  Net -1240 ml      Physical Exam    CVP 9 General: elderly, moderately obese WF, sitting up in bed. No distress Neck: JVP 8 cm, no thyromegaly or thyroid nodule.  Lungs: clear  CV: Nondisplaced PMI.  irregularly irregular rhythm, regular rate no murmur. Trace bilateral LEE w/ chronic venous stasis dermatitis  Abdomen: obese, soft, nontender, no hepatosplenomegaly, no distention.  Neurologic: Alert and oriented x 3.  Psych: Pleasant, Normal affect. Extremities: No clubbing or cyanosis. + RUE PICC  HEENT: Normal.    Telemetry   Afib 70s, occasional PVCs  personally reviewed  Labs    CBC Recent Labs    07/17/20 0500 07/18/20 0600  WBC 4.1 4.2  HGB 11.9* 12.4  HCT 37.8 39.7  MCV 93.3 93.2  PLT 101* 101*   Basic Metabolic Panel Recent Labs    14/48/18 1455  07/16/20 1455 07/17/20 0500 07/18/20 0600  NA 135   < > 136 138  K 4.0   < > 3.9 3.8  CL 93*   < > 97* 101  CO2 36*   < > 34* 32  GLUCOSE 122*   < > 93 91  BUN 25*   < > 28* 32*  CREATININE 1.01*   < > 1.28* 0.93  CALCIUM 9.5   < > 8.6* 8.9  MG 2.0  --   --   --   PHOS 3.9  --   --   --    < > = values in this interval not displayed.   Liver Function Tests Recent Labs    07/16/20 1455  ALBUMIN 3.0*   No results for input(s): LIPASE, AMYLASE in the last 72 hours. Cardiac Enzymes No results for input(s): CKTOTAL, CKMB, CKMBINDEX, TROPONINI in the last 72 hours.  BNP: BNP (last 3 results) Recent Labs    08/02/19 1618 11/08/19 1350 07/12/20 1854  BNP 988.1* 4,306.1* 1,419.2*    ProBNP (last 3 results) No results for input(s): PROBNP in the last 8760 hours.   D-Dimer No results for input(s): DDIMER in the last 72 hours. Hemoglobin A1C No results for input(s): HGBA1C in the last 72 hours. Fasting Lipid Panel No results for input(s): CHOL,  HDL, LDLCALC, TRIG, CHOLHDL, LDLDIRECT in the last 72 hours. Thyroid Function Tests No results for input(s): TSH, T4TOTAL, T3FREE, THYROIDAB in the last 72 hours.  Invalid input(s): FREET3  Other results:   Imaging    No results found.   Medications:     Scheduled Medications: . amiodarone  100 mg Oral Daily  . carbamide peroxide  5 drop Both EARS BID  . Chlorhexidine Gluconate Cloth  6 each Topical Daily  . digoxin  0.125 mg Oral Daily  . edoxaban  60 mg Oral Q24H  . losartan  12.5 mg Oral Daily  . pantoprazole  40 mg Oral Daily  . PHENobarbital  64.8 mg Oral BID  . potassium chloride  40 mEq Oral Q8H  . sodium chloride flush  10-40 mL Intracatheter Q12H  . sodium chloride flush  3 mL Intravenous Q12H  . spironolactone  25 mg Oral Daily  . torsemide  60 mg Oral BID    Infusions: . sodium chloride      PRN Medications: sodium chloride, acetaminophen, sodium chloride flush, sodium chloride  flush  Assessment/Plan   1. Acute on chronic systolic CHF: Nonischemic cardiomyopathy.  Echo this admit with EF 25-30%, moderate RV dysfunction.  Stable from prior.  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 Johney Frame in 2019.  RV pacing 28% of the time by device interrogation this admission.  NYHA class IIIb-IV at admission.  Very limited GDMT due to inability to tolerate HF meds.  Excellent diuresis w/ IV Lasix, overall down 21 lb.  CVP down to 9 with diuresis, co-ox 68%. Now off IV diuretics  - Continue PO torsemide 60 mg bid  (was on 40 mg bid at home prior).   - Continue digoxin 0.125 daily.  - Continue spironolactone 25 mg daily.  - Continue losartan 12.5 mg daily, no BP room to increase.  - Decreased backup pacing rate to 50 bpm from 70 bpm to try to limit RV pacing.  2. Atrial fibrillation: Chronic.  She is on edoxaban.  Atria are large and she has been in atrial fibrillation for a prolonged period of time, unlikely to successfully cardiovert.  3. PVCs: She is on amiodarone to suppress PVCs.  PVCs appear rare on telemetry review.  Continue amiodarone.  4. OSA: CPAP.  5. AKI - SCr bump w/ diuresis, up to 1.28 - improved today 0.93  Stable for d/c home from CHF standpoint.  Cardiac Meds for Home  Amiodarone 100 mg daily  Digoxin 0.125 mg daily  edoxaban 60 mg daily  Losartan 12.5 mg daily  KCl 40 mEq tid  Spironolactone 25 mg daily  Torsemide 60 mg bid   We will arrange post hospital f/u in [facility] and will place appt info in AVS    Robbie Lis, PA-C 07/18/2020 8:28 AM  Patient seen with PA, agree with the above note.   She is stable today, creatinine lower.  CVP around 9.  Now on torsemide 60 mg bid.    I think she is ready for home on the above regimen.  We will arrange followup for her in CHF clinic.   Marca Ancona 07/18/2020 1:36 PM

## 2020-07-18 NOTE — Telephone Encounter (Signed)
Pharmacy calls nurse requesting a 30 day supply of Torsemide. Please advise if appropriate.

## 2020-07-19 ENCOUNTER — Telehealth: Payer: Self-pay | Admitting: *Deleted

## 2020-07-19 DIAGNOSIS — J449 Chronic obstructive pulmonary disease, unspecified: Secondary | ICD-10-CM | POA: Diagnosis not present

## 2020-07-19 DIAGNOSIS — I872 Venous insufficiency (chronic) (peripheral): Secondary | ICD-10-CM | POA: Diagnosis not present

## 2020-07-19 DIAGNOSIS — I482 Chronic atrial fibrillation, unspecified: Secondary | ICD-10-CM | POA: Diagnosis not present

## 2020-07-19 DIAGNOSIS — I110 Hypertensive heart disease with heart failure: Secondary | ICD-10-CM | POA: Diagnosis not present

## 2020-07-19 DIAGNOSIS — I10 Essential (primary) hypertension: Secondary | ICD-10-CM | POA: Diagnosis not present

## 2020-07-19 DIAGNOSIS — I081 Rheumatic disorders of both mitral and tricuspid valves: Secondary | ICD-10-CM | POA: Diagnosis not present

## 2020-07-19 DIAGNOSIS — S81801D Unspecified open wound, right lower leg, subsequent encounter: Secondary | ICD-10-CM | POA: Diagnosis not present

## 2020-07-19 DIAGNOSIS — I5023 Acute on chronic systolic (congestive) heart failure: Secondary | ICD-10-CM | POA: Diagnosis not present

## 2020-07-19 DIAGNOSIS — M170 Bilateral primary osteoarthritis of knee: Secondary | ICD-10-CM | POA: Diagnosis not present

## 2020-07-19 DIAGNOSIS — I0981 Rheumatic heart failure: Secondary | ICD-10-CM | POA: Diagnosis not present

## 2020-07-19 MED ORDER — TORSEMIDE 20 MG PO TABS: 60.0000 mg | ORAL_TABLET | Freq: Two times a day (BID) | ORAL | 0 refills | Status: DC

## 2020-07-19 NOTE — Telephone Encounter (Signed)
Bovin from [facility] calling for PT verbal orders as follows:  1 time(s) weekly for 5 week(s)  and also RN eval for wound care.  Verbal orders given per [facility] protocol  Jone Baseman, CMA

## 2020-07-19 NOTE — Telephone Encounter (Signed)
Sent in updated # of tabs Latrelle Dodrill, MD

## 2020-07-20 DIAGNOSIS — I0981 Rheumatic heart failure: Secondary | ICD-10-CM | POA: Diagnosis not present

## 2020-07-20 DIAGNOSIS — S81801D Unspecified open wound, right lower leg, subsequent encounter: Secondary | ICD-10-CM | POA: Diagnosis not present

## 2020-07-20 DIAGNOSIS — J449 Chronic obstructive pulmonary disease, unspecified: Secondary | ICD-10-CM | POA: Diagnosis not present

## 2020-07-20 DIAGNOSIS — I5023 Acute on chronic systolic (congestive) heart failure: Secondary | ICD-10-CM | POA: Diagnosis not present

## 2020-07-20 DIAGNOSIS — I482 Chronic atrial fibrillation, unspecified: Secondary | ICD-10-CM | POA: Diagnosis not present

## 2020-07-20 DIAGNOSIS — M170 Bilateral primary osteoarthritis of knee: Secondary | ICD-10-CM | POA: Diagnosis not present

## 2020-07-20 DIAGNOSIS — I872 Venous insufficiency (chronic) (peripheral): Secondary | ICD-10-CM | POA: Diagnosis not present

## 2020-07-20 DIAGNOSIS — I110 Hypertensive heart disease with heart failure: Secondary | ICD-10-CM | POA: Diagnosis not present

## 2020-07-20 DIAGNOSIS — I081 Rheumatic disorders of both mitral and tricuspid valves: Secondary | ICD-10-CM | POA: Diagnosis not present

## 2020-07-21 ENCOUNTER — Telehealth: Payer: Self-pay

## 2020-07-21 NOTE — Telephone Encounter (Signed)
Ray from [employer] calling for OT verbal orders as follows:  1 time(s) weekly for 4 week(s).   Verbal orders given per [facility] protocol  Veronda Prude, RN

## 2020-07-22 DIAGNOSIS — I5023 Acute on chronic systolic (congestive) heart failure: Secondary | ICD-10-CM | POA: Diagnosis not present

## 2020-07-22 DIAGNOSIS — J449 Chronic obstructive pulmonary disease, unspecified: Secondary | ICD-10-CM | POA: Diagnosis not present

## 2020-07-22 DIAGNOSIS — I482 Chronic atrial fibrillation, unspecified: Secondary | ICD-10-CM | POA: Diagnosis not present

## 2020-07-22 DIAGNOSIS — I081 Rheumatic disorders of both mitral and tricuspid valves: Secondary | ICD-10-CM | POA: Diagnosis not present

## 2020-07-22 DIAGNOSIS — I0981 Rheumatic heart failure: Secondary | ICD-10-CM | POA: Diagnosis not present

## 2020-07-22 DIAGNOSIS — I110 Hypertensive heart disease with heart failure: Secondary | ICD-10-CM | POA: Diagnosis not present

## 2020-07-22 DIAGNOSIS — M170 Bilateral primary osteoarthritis of knee: Secondary | ICD-10-CM | POA: Diagnosis not present

## 2020-07-22 DIAGNOSIS — I872 Venous insufficiency (chronic) (peripheral): Secondary | ICD-10-CM | POA: Diagnosis not present

## 2020-07-22 DIAGNOSIS — S81801D Unspecified open wound, right lower leg, subsequent encounter: Secondary | ICD-10-CM | POA: Diagnosis not present

## 2020-07-24 ENCOUNTER — Ambulatory Visit (INDEPENDENT_AMBULATORY_CARE_PROVIDER_SITE_OTHER): Payer: Medicare HMO | Admitting: Internal Medicine

## 2020-07-24 ENCOUNTER — Other Ambulatory Visit: Payer: Self-pay

## 2020-07-24 ENCOUNTER — Encounter: Payer: Self-pay | Admitting: Internal Medicine

## 2020-07-24 VITALS — BP 110/80 | HR 71 | Ht <= 58 in | Wt 189.0 lb

## 2020-07-24 DIAGNOSIS — I482 Chronic atrial fibrillation, unspecified: Secondary | ICD-10-CM | POA: Diagnosis not present

## 2020-07-24 DIAGNOSIS — I5022 Chronic systolic (congestive) heart failure: Secondary | ICD-10-CM

## 2020-07-24 DIAGNOSIS — I519 Heart disease, unspecified: Secondary | ICD-10-CM

## 2020-07-24 DIAGNOSIS — D6869 Other thrombophilia: Secondary | ICD-10-CM

## 2020-07-24 DIAGNOSIS — R001 Bradycardia, unspecified: Secondary | ICD-10-CM

## 2020-07-24 DIAGNOSIS — I493 Ventricular premature depolarization: Secondary | ICD-10-CM | POA: Diagnosis not present

## 2020-07-24 NOTE — Progress Notes (Signed)
PCP: Latrelle Dodrill, MD Primary Cardiologist: Dr Gala Romney Primary EP:  Dr Johney Frame  Misty Gray is a 69 y.o. female who presents today for routine electrophysiology followup.  I have not seen her in several years.  She was recently hospitalized with CHF.  She is doing reasonably well today.  She is on home O2.   Today, she denies symptoms of palpitations, chest pain, , dizziness, presyncope, or syncope.  The patient is otherwise without complaint today.   Past Medical History:  Diagnosis Date  . Abnormal CT of the head 12/16/1984   R parietal atrophy  . Allergic rhinitis, cause unspecified   . Altered mental status 11/28/2016  . Anemia   . Arthritis    "hands and knees" (09/06/2015)  . Atrial fibrillation (HCC)   . Cellulitis 09/07/2015  . Cellulitis and abscess of leg 09/2016   bilateral  . CHF (congestive heart failure) (HCC)   . Diaphragmatic hernia without mention of obstruction or gangrene   . Dyspnea   . Exertional dyspnea 06/22/12  . Family history of adverse reaction to anesthesia    "daughter fights w/them; just can't relax during OR; stay awake during the OR"  . Fatigue 06/22/2012  . Female stress incontinence   . GERD (gastroesophageal reflux disease)   . Grand mal seizure (HCC)   . H/O hiatal hernia    two removed  . Heart murmur   . High cholesterol   . History of blood transfusion 1956   "related to nose bleed"  . Hypertension   . Influenza A 01/11/2014  . Lichenification and lichen simplex chronicus   . Migraine    "when I was a teenager"  . Morbid obesity (HCC)   . Nonischemic cardiomyopathy (HCC)    EF has normalized-repeat Pending   . On home oxygen therapy    "2L when I'm asleep in bed" (09/06/2015)  . OSA on CPAP   . Pacemaker  st judes    Patient states "this is my third pacemaker"  . Pneumonia 2004; 2011; 11/2011  . Seizures (HCC) since 1981   "can hear you talking but sounds like you are in big tunnel; have them often if not taking RX; last  one was 06/17/12" (09/06/2015)  . Shortness of breath 10/24/2010   Qualifier: Diagnosis of  By: [place], Bonnie    . Sick sinus syndrome (HCC)    WITH PRIOR DDD PACEMAKER IMPLANTATION  . Somnolence   . Stroke [facility]) 1988   "mouth drawed real bad on my left side; found out it was a seizure"  . Syncope and collapse 06/22/12   "hit forehead and left knee"; denies loss of consciousness  . Unspecified venous (peripheral) insufficiency    Past Surgical History:  Procedure Laterality Date  . APPENDECTOMY    . CARDIAC CATHETERIZATION N/A 10/03/2016   Procedure: Right/Left Heart Cath and Coronary Angiography;  Surgeon: Dolores Patty, MD;  Location: [facility] INVASIVE CV LAB;  Service: Cardiovascular;  Laterality: N/A;  . HERNIA REPAIR  ? 1988; 04/15/1998  . INSERT / REPLACE / REMOVE PACEMAKER  12/16/1990   DDD EF 30%;  Marland Kitchen INSERT / REPLACE / REMOVE PACEMAKER  07/25/2003   replaced by BB, leads are both IS1 and from 1992  . INSERT / REPLACE / REMOVE PACEMAKER  05/21/2011   JA; generator change  . LEG SKIN LESION  BIOPSY / EXCISION  05/16/2001   Lichen Planus  . VENTRAL HERNIA REPAIR  1989; 04/15/1998  ROS- all systems are reviewed and negative except as per HPI above  Current Outpatient Medications  Medication Sig Dispense Refill  . amiodarone (PACERONE) 200 MG tablet Take 0.5 tablets (100 mg total) by mouth daily. 45 tablet 3  . cholecalciferol (VITAMIN D) 25 MCG (1000 UNIT) tablet TAKE 1 TABLET BY MOUTH ONCE DAILY. 28 tablet 11  . edoxaban (SAVAYSA) 60 MG TABS tablet Take 60 mg by mouth daily. 30 tablet 11  . fluticasone (FLONASE) 50 MCG/ACT nasal spray Place 2 sprays into both nostrils daily as needed. For congestion. 16 g 1  . losartan (COZAAR) 25 MG tablet Take 0.5 tablets (12.5 mg total) by mouth daily. 30 tablet 0  . omeprazole (PRILOSEC) 20 MG capsule TAKE (1) CAPSULE BY MOUTH ONCE DAILY. 28 capsule 11  . PHENobarbital (LUMINAL) 64.8 MG tablet TAKE 1 TABLET BY MOUTH TWICE DAILY 56 tablet 3  .  potassium chloride (K-DUR) 10 MEQ tablet TAKE 4 TABLETS BY MOUTH THREE TIMES DAILY (MORNING, NOON, AND NIGHT) 336 tablet 11  . spironolactone (ALDACTONE) 25 MG tablet Take 1 tablet (25 mg total) by mouth daily. 30 tablet 0  . torsemide (DEMADEX) 20 MG tablet Take 3 tablets (60 mg total) by mouth 2 (two) times daily. 180 tablet 0  . triamcinolone cream (KENALOG) 0.5 % Apply topically 3 (three) times daily. 30 g 0   No current facility-administered medications for this visit.    Physical Exam: Vitals:   07/24/20 1212  BP: 110/80  Pulse: 71  SpO2: 99%  Weight: 189 lb (85.7 kg)  Height: 4\' 9"  (1.448 m)    GEN- The patient is elderly appearing, alert and oriented x 3 today.   Head- normocephalic, atraumatic Eyes-  Sclera clear, conjunctiva pink Ears- hearing intact Oropharynx- clear Lungs-   normal work of breathing Chest- pacemaker pocket is well healed Heart- irregular rate and rhythm  GI- sof  Extremities- no clubbing, cyanosis, + edema  Pacemaker interrogation- reviewed in detail today,  See PACEART report  ekg tracing ordered today is personally reviewed and shows afib, V rate 71 bpm, QRS 92 msec, no PVCs  Assessment and Plan:  1. Symptomatic sinus bradycardia  Normal pacemaker function See Pace Art report No changes today she is not device dependant today She is unable able to do remotes because she has poor wiring in her house and notes that plugging in the transmitter blows her fuse.  She does not think the issue is with the transmitter as the same thing happens.  I have advised that she really needs to have her landlord fix her electrical issues if possible.  She is looking for another place to live but has very limited finances.  2. Persistent afib She has severe LA enlargement and persistent afib She is on savaysa for stroke prevention.  3. Chronic systolic dysfunction EF 25-30% QRS is 90 msec,  And she V paces <40%, No indication for CRT Given advanced age and  comoribidites, I would not advise ICD for this patient  4. Occasional PVCs On amiodarone for suppression per Dr  I am not convinced that this is helping but will defer to his team.  Her PVC burden appears quite low however she continues to have declining EF of unknown cause. She will require regular follow-up of LFTs/TFTs on this medicine  5. OSA Uses CPAP  Risks, benefits and potential toxicities for medications prescribed and/or refilled reviewed with patient today.   Return to see EP PA in 6 months, I  will see in a year  [name] MD, [facility] 07/24/2020 12:15 PM

## 2020-07-24 NOTE — Patient Instructions (Signed)
Medication Instructions:  Your physician recommends that you continue on your current medications as directed. Please refer to the Current Medication list given to you today.  *If you need a refill on your cardiac medications before your next appointment, please call your pharmacy*  Lab Work: None ordered.  If you have labs (blood work) drawn today and your tests are completely normal, you will receive your results only by: Marland Kitchen MyChart Message (if you have MyChart) OR . A paper copy in the mail If you have any lab test that is abnormal or we need to change your treatment, we will call you to review the results.  Testing/Procedures: None ordered.  Follow-Up: At [facility], you and your health needs are our priority.  As part of our continuing mission to provide you with exceptional heart care, we have created designated Provider Care Teams.  These Care Teams include your primary Cardiologist (physician) and Advanced Practice Providers (APPs -  Physician Assistants and Nurse Practitioners) who all work together to provide you with the care you need, when you need it.  We recommend signing up for the patient portal called "MyChart".  Sign up information is provided on this After Visit Summary.  MyChart is used to connect with patients for Virtual Visits (Telemedicine).  Patients are able to view lab/test results, encounter notes, upcoming appointments, etc.  Non-urgent messages can be sent to your provider as well.   To learn more about what you can do with MyChart, go to [email or web address].    Your next appointment:   Your physician wants you to follow-up in: 6 months with Mardelle Matte. 1 year with Dr. Johney Frame. You will receive a reminder letter in the mail two months in advance. If you don't receive a letter, please call our office to schedule the follow-up appointment.    Other Instructions:

## 2020-07-25 DIAGNOSIS — J449 Chronic obstructive pulmonary disease, unspecified: Secondary | ICD-10-CM | POA: Diagnosis not present

## 2020-07-25 DIAGNOSIS — M170 Bilateral primary osteoarthritis of knee: Secondary | ICD-10-CM | POA: Diagnosis not present

## 2020-07-25 DIAGNOSIS — I482 Chronic atrial fibrillation, unspecified: Secondary | ICD-10-CM | POA: Diagnosis not present

## 2020-07-25 DIAGNOSIS — S81801D Unspecified open wound, right lower leg, subsequent encounter: Secondary | ICD-10-CM | POA: Diagnosis not present

## 2020-07-25 DIAGNOSIS — I081 Rheumatic disorders of both mitral and tricuspid valves: Secondary | ICD-10-CM | POA: Diagnosis not present

## 2020-07-25 DIAGNOSIS — I110 Hypertensive heart disease with heart failure: Secondary | ICD-10-CM | POA: Diagnosis not present

## 2020-07-25 DIAGNOSIS — I5023 Acute on chronic systolic (congestive) heart failure: Secondary | ICD-10-CM | POA: Diagnosis not present

## 2020-07-25 DIAGNOSIS — I0981 Rheumatic heart failure: Secondary | ICD-10-CM | POA: Diagnosis not present

## 2020-07-25 DIAGNOSIS — I872 Venous insufficiency (chronic) (peripheral): Secondary | ICD-10-CM | POA: Diagnosis not present

## 2020-07-26 DIAGNOSIS — I0981 Rheumatic heart failure: Secondary | ICD-10-CM | POA: Diagnosis not present

## 2020-07-26 DIAGNOSIS — I5023 Acute on chronic systolic (congestive) heart failure: Secondary | ICD-10-CM | POA: Diagnosis not present

## 2020-07-26 DIAGNOSIS — I081 Rheumatic disorders of both mitral and tricuspid valves: Secondary | ICD-10-CM | POA: Diagnosis not present

## 2020-07-26 DIAGNOSIS — J449 Chronic obstructive pulmonary disease, unspecified: Secondary | ICD-10-CM | POA: Diagnosis not present

## 2020-07-26 DIAGNOSIS — I110 Hypertensive heart disease with heart failure: Secondary | ICD-10-CM | POA: Diagnosis not present

## 2020-07-26 DIAGNOSIS — S81801D Unspecified open wound, right lower leg, subsequent encounter: Secondary | ICD-10-CM | POA: Diagnosis not present

## 2020-07-26 DIAGNOSIS — M170 Bilateral primary osteoarthritis of knee: Secondary | ICD-10-CM | POA: Diagnosis not present

## 2020-07-26 DIAGNOSIS — I482 Chronic atrial fibrillation, unspecified: Secondary | ICD-10-CM | POA: Diagnosis not present

## 2020-07-26 DIAGNOSIS — I872 Venous insufficiency (chronic) (peripheral): Secondary | ICD-10-CM | POA: Diagnosis not present

## 2020-07-27 ENCOUNTER — Telehealth: Payer: Self-pay

## 2020-07-27 NOTE — Telephone Encounter (Signed)
Attempted to call patient to follow up. No answer, left VM for patient to return call to office.   Veronda Prude, RN

## 2020-07-27 NOTE — Telephone Encounter (Signed)
Noted. Recommend they contact cardiologist if weight gain continues Latrelle Dodrill, MD

## 2020-07-27 NOTE — Telephone Encounter (Signed)
Received phone call from Roe Coombs- OT with [facility] regarding patient having recent weight gain. Roe Coombs gives the following weights:  8/4- 184.2 lbs  8/5- 185 lbs 8/7- 184.2 lbs 8/10- 183.8 lbs 8/11- 188.8 lbs  Roe Coombs reports that last weight on 8/11 was in the afternoon. States that patient has no current complaints, just making provider aware.   Veronda Prude, RN

## 2020-08-02 DIAGNOSIS — I110 Hypertensive heart disease with heart failure: Secondary | ICD-10-CM | POA: Diagnosis not present

## 2020-08-02 DIAGNOSIS — M170 Bilateral primary osteoarthritis of knee: Secondary | ICD-10-CM | POA: Diagnosis not present

## 2020-08-02 DIAGNOSIS — S81801D Unspecified open wound, right lower leg, subsequent encounter: Secondary | ICD-10-CM | POA: Diagnosis not present

## 2020-08-02 DIAGNOSIS — I081 Rheumatic disorders of both mitral and tricuspid valves: Secondary | ICD-10-CM | POA: Diagnosis not present

## 2020-08-02 DIAGNOSIS — J449 Chronic obstructive pulmonary disease, unspecified: Secondary | ICD-10-CM | POA: Diagnosis not present

## 2020-08-02 DIAGNOSIS — I872 Venous insufficiency (chronic) (peripheral): Secondary | ICD-10-CM | POA: Diagnosis not present

## 2020-08-02 DIAGNOSIS — I482 Chronic atrial fibrillation, unspecified: Secondary | ICD-10-CM | POA: Diagnosis not present

## 2020-08-02 DIAGNOSIS — I0981 Rheumatic heart failure: Secondary | ICD-10-CM | POA: Diagnosis not present

## 2020-08-02 DIAGNOSIS — I5023 Acute on chronic systolic (congestive) heart failure: Secondary | ICD-10-CM | POA: Diagnosis not present

## 2020-08-03 ENCOUNTER — Other Ambulatory Visit: Payer: Self-pay | Admitting: Cardiology

## 2020-08-03 DIAGNOSIS — I482 Chronic atrial fibrillation, unspecified: Secondary | ICD-10-CM | POA: Diagnosis not present

## 2020-08-03 DIAGNOSIS — I872 Venous insufficiency (chronic) (peripheral): Secondary | ICD-10-CM | POA: Diagnosis not present

## 2020-08-03 DIAGNOSIS — I081 Rheumatic disorders of both mitral and tricuspid valves: Secondary | ICD-10-CM | POA: Diagnosis not present

## 2020-08-03 DIAGNOSIS — I110 Hypertensive heart disease with heart failure: Secondary | ICD-10-CM | POA: Diagnosis not present

## 2020-08-03 DIAGNOSIS — M170 Bilateral primary osteoarthritis of knee: Secondary | ICD-10-CM | POA: Diagnosis not present

## 2020-08-03 DIAGNOSIS — I5023 Acute on chronic systolic (congestive) heart failure: Secondary | ICD-10-CM | POA: Diagnosis not present

## 2020-08-03 DIAGNOSIS — S81801D Unspecified open wound, right lower leg, subsequent encounter: Secondary | ICD-10-CM | POA: Diagnosis not present

## 2020-08-03 DIAGNOSIS — I0981 Rheumatic heart failure: Secondary | ICD-10-CM | POA: Diagnosis not present

## 2020-08-03 DIAGNOSIS — J449 Chronic obstructive pulmonary disease, unspecified: Secondary | ICD-10-CM | POA: Diagnosis not present

## 2020-08-04 DIAGNOSIS — I081 Rheumatic disorders of both mitral and tricuspid valves: Secondary | ICD-10-CM | POA: Diagnosis not present

## 2020-08-04 DIAGNOSIS — I0981 Rheumatic heart failure: Secondary | ICD-10-CM | POA: Diagnosis not present

## 2020-08-04 DIAGNOSIS — I5023 Acute on chronic systolic (congestive) heart failure: Secondary | ICD-10-CM | POA: Diagnosis not present

## 2020-08-04 DIAGNOSIS — I872 Venous insufficiency (chronic) (peripheral): Secondary | ICD-10-CM | POA: Diagnosis not present

## 2020-08-04 DIAGNOSIS — J449 Chronic obstructive pulmonary disease, unspecified: Secondary | ICD-10-CM | POA: Diagnosis not present

## 2020-08-04 DIAGNOSIS — I482 Chronic atrial fibrillation, unspecified: Secondary | ICD-10-CM | POA: Diagnosis not present

## 2020-08-04 DIAGNOSIS — M170 Bilateral primary osteoarthritis of knee: Secondary | ICD-10-CM | POA: Diagnosis not present

## 2020-08-04 DIAGNOSIS — S81801D Unspecified open wound, right lower leg, subsequent encounter: Secondary | ICD-10-CM | POA: Diagnosis not present

## 2020-08-04 DIAGNOSIS — I110 Hypertensive heart disease with heart failure: Secondary | ICD-10-CM | POA: Diagnosis not present

## 2020-08-06 NOTE — Progress Notes (Signed)
Heart Failure Date:  08/07/2020   ID:  Misty Gray, DOB 1951/06/17, MRN [id number]  Location: Home  Provider location: [facility] Type of Visit: Established patient  PCP:  Misty Dodrill, MD- Internal Medicine   Cardiologist:  No primary care provider on file. Primary HF: Misty Gray EP: Misty Gray   Chief Complaint: Heart Failure    History of Present Illness: Misty Gray is a 69 y/o woman with history of morbid obesity, OSA, chronic atrial fibrillation with tachy-brady syndrome s/p PPM, frequent PVCs, systolic HF due to NICM.  Admitted for ADHF in 10/17. Cath with normal coronary arteries and elevated filling pressures. Echo 10/17 EF 30-35% (previously 35-40%). Felt to have possible PVC or RV pacing cardiomyopathy. (She has 81% RV pacing and 15-20 PVCs per minute). Started on po amio with suppression of PVCs. Diuresed 41 pounds. Discharge weight 197.   Admitted 12/14 through 12/06/2016 with marked volume overload. Discharge weight was 195 pounds.Transitioned to torsemide 40 mg in am and 20 mg in pm.   Echo 07/2017 LVEF 25-30%, Trivial AI, Mod MR, Severe LAE, Mild RV dilation, Mild RAE, PA peak pressure 31 mm Hg.  Echo 1/20 EF 25-30% (no improvement with PVC suppression)  Admitted 07/2020 with A/C systolic heart failure. Volume overloaded. Diuresed with IV lasix and transitioned to torsemide 60 mg twice a day. RV pacing 28% of the time by device interrogation this admission.  Very limited GDMT due to inability to tolerate HF meds.  Excellent diuresis w/ IV Lasix, overall down 21 lb. Decreased backup pacing rate to 50 bpm from 70 bpm. She is unable to perform remote device transmissions because of her housing situation. Discharge date 07/18/2020. She was supposed to start digoxin, losartan, and spiro but never received the medications.   Today she returns for post hospital HF follow up. Overall feeling fine. Limited by joint pain. SOB with exertion.    Denies PND. Sleeps on large pillow. Appetite ok. Had [place] food last week by she asked for no seasoning or salt. No fever or chills. Weight at home not right because floor is slanted. Taking all medications but never received digoxin, losartan, or spironolactone from her pharmacy. Lives with son, husband and grandchildren.   Studies: ECHO 06/2020  EF 25-30% RV moderately reduced and enlarged.   R/LHC 10/03/16 Ao = 94/58 (72) LV = 104/20 RA = 21 RV = 65/4/19 PA = 65/21 (38) PCW = 23 Fick cardiac output/index = 6.1/2.9 PVR = 2.5 WU FA sat = 97%  PA sat = 69%, 70% Assessment: 1. Normal coronary arteries 2. NICM with EF 30-35% by echo 3. Persistently elevated biventricular pressures    Misty Gray denies symptoms worrisome for COVID 19.   Past Medical History:  Diagnosis Date  . Abnormal CT of the head 12/16/1984   R parietal atrophy  . Allergic rhinitis, cause unspecified   . Altered mental status 11/28/2016  . Anemia   . Arthritis    "hands and knees" (09/06/2015)  . Atrial fibrillation (HCC)   . Cellulitis 09/07/2015  . Cellulitis and abscess of leg 09/2016   bilateral  . CHF (congestive heart failure) (HCC)   . Diaphragmatic hernia without mention of obstruction or gangrene   . Dyspnea   . Exertional dyspnea 06/22/12  . Family history of adverse reaction to anesthesia    "daughter fights w/them; just can't relax during OR; stay awake during the OR"  . Fatigue 06/22/2012  .  Female stress incontinence   . GERD (gastroesophageal reflux disease)   . Grand mal seizure (HCC)   . H/O hiatal hernia    two removed  . Heart murmur   . High cholesterol   . History of blood transfusion 1956   "related to nose bleed"  . Hypertension   . Influenza A 01/11/2014  . Lichenification and lichen simplex chronicus   . Migraine    "when I was a teenager"  . Morbid obesity (HCC)   . Nonischemic cardiomyopathy (HCC)    EF has normalized-repeat Pending   . On home oxygen therapy      "2L when I'm asleep in bed" (09/06/2015)  . OSA on CPAP   . Pacemaker  st judes    Patient states "this is my third pacemaker"  . Pneumonia 2004; 2011; 11/2011  . Seizures (HCC) since 1981   "can hear you talking but sounds like you are in big tunnel; have them often if not taking RX; last one was 06/17/12" (09/06/2015)  . Shortness of breath 10/24/2010   Qualifier: Diagnosis of  By: [place], Bonnie    . Sick sinus syndrome (HCC)    WITH PRIOR DDD PACEMAKER IMPLANTATION  . Somnolence   . Stroke [facility]) 1988   "mouth drawed real bad on my left side; found out it was a seizure"  . Syncope and collapse 06/22/12   "hit forehead and left knee"; denies loss of consciousness  . Unspecified venous (peripheral) insufficiency    Past Surgical History:  Procedure Laterality Date  . APPENDECTOMY    . CARDIAC CATHETERIZATION N/A 10/03/2016   Procedure: Right/Left Heart Cath and Coronary Angiography;  Surgeon: Dolores Patty, MD;  Location: [facility] INVASIVE CV LAB;  Service: Cardiovascular;  Laterality: N/A;  . HERNIA REPAIR  ? 1988; 04/15/1998  . INSERT / REPLACE / REMOVE PACEMAKER  12/16/1990   DDD EF 30%;  Marland Kitchen INSERT / REPLACE / REMOVE PACEMAKER  07/25/2003   replaced by BB, leads are both IS1 and from 1992  . INSERT / REPLACE / REMOVE PACEMAKER  05/21/2011   JA; generator change  . LEG SKIN LESION  BIOPSY / EXCISION  05/16/2001   Lichen Planus  . VENTRAL HERNIA REPAIR  1989; 04/15/1998     Current Outpatient Medications  Medication Sig Dispense Refill  . amiodarone (PACERONE) 200 MG tablet Take 0.5 tablets (100 mg total) by mouth daily. 45 tablet 3  . cholecalciferol (VITAMIN D) 25 MCG (1000 UNIT) tablet TAKE 1 TABLET BY MOUTH ONCE DAILY. 28 tablet 11  . edoxaban (SAVAYSA) 60 MG TABS tablet Take 60 mg by mouth daily. 30 tablet 11  . fluticasone (FLONASE) 50 MCG/ACT nasal spray Place 2 sprays into both nostrils daily as needed. For congestion. 16 g 1  . losartan (COZAAR) 25 MG tablet Take 0.5 tablets (12.5  mg total) by mouth daily. 30 tablet 0  . omeprazole (PRILOSEC) 20 MG capsule TAKE (1) CAPSULE BY MOUTH ONCE DAILY. 28 capsule 5  . PHENobarbital (LUMINAL) 64.8 MG tablet TAKE 1 TABLET BY MOUTH TWICE DAILY 56 tablet 5  . potassium chloride (KLOR-CON) 10 MEQ tablet TAKE 4 TABLETS BY MOUTH THREE TIMES DAILY (MORNING, NOON, AND NIGHT) 336 tablet 11  . spironolactone (ALDACTONE) 25 MG tablet Take 1 tablet (25 mg total) by mouth daily. 30 tablet 0  . torsemide (DEMADEX) 20 MG tablet Take 3 tablets (60 mg total) by mouth 2 (two) times daily. 180 tablet 0  . triamcinolone cream (KENALOG) 0.5 %  Apply topically 3 (three) times daily. 30 g 0   No current facility-administered medications for this encounter.    Allergies:   Aspirin, Penicillins, Tape, Wool alcohol [lanolin], Amoxicillin, and Ampicillin   Social History:  The patient  reports that she quit smoking about 51 years ago. Her smoking use included cigarettes. She has a 7.00 pack-year smoking history. She has never used smokeless tobacco. She reports that she does not drink alcohol and does not use drugs.   Family History:  The patient's family history includes Cancer in her mother and sister; Diabetes in her son; Heart disease in her father; Hypertension in her brother and sister; Rheum arthritis in her daughter; Sleep apnea in her son; Stroke in her father.   ROS:  Please see the history of present illness.   All other systems are personally reviewed and negative.  Vitals:   08/07/20 0916  BP: 128/82  Pulse: 80  SpO2: 95%   Wt Readings from Last 3 Encounters:  08/07/20 85.1 kg (187 lb 9.6 oz)  07/24/20 85.7 kg (189 lb)  07/18/20 83.9 kg (185 lb)    Exam:   General:  No resp difficulty. Arrived in wheel chair.  HEENT: normal Neck: supple. JVP 10-11. Carotids 2+ bilat; no bruits. No lymphadenopathy or thryomegaly appreciated. Cor: PMI nondisplaced. Regular rate & rhythm. No rubs, gallops or murmurs. Lungs: clear Abdomen: soft,  nontender, nondistended. No hepatosplenomegaly. No bruits or masses. Good bowel sounds. Extremities: no cyanosis, clubbing, rash, R and LLE 1-2+ edema Neuro: alert & orientedx3, cranial nerves grossly intact. moves all 4 extremities w/o difficulty. Affect pleasant  Recent Labs: 07/12/2020: ALT 12; B Natriuretic Peptide 1,419.2 07/16/2020: Magnesium 2.0 07/18/2020: BUN 32; Creatinine, Ser 0.93; Hemoglobin 12.4; Platelets 101; Potassium 3.8; Sodium 138  Personally reviewed   Wt Readings from Last 3 Encounters:  08/07/20 85.1 kg (187 lb 9.6 oz)  07/24/20 85.7 kg (189 lb)  07/18/20 83.9 kg (185 lb)      ASSESSMENT AND PLAN:  1. Chronic systolic HF due to NICM. ? PVC induced vs RV pacing. Echo 12/06/2016 EF 10-15%. Cath 10/17 normal coronary arteries.  Echo 02/2017 with LVEF 15-20% despite PVC suppression. Echo 07/2017 EF 25-30%.  - Echo 07/2017 LVEF 25-30%, Trivial AI, Mod MR, Severe LAE, Mild RV dilation, Mild RAE, PA peak pressure 31 mm Hg. Echo 1/20 EF 25-30%. ECHO 2021 EF 25-30% RV moderately reduced.  NYHA III. Volume status elevated. Increase torsemide to 80 mg twice a day x2 days then back to 60 mg twice a day.    - Unable to tolerate beta blocker. -Restart diogxin  0.125 mg daily .  - Restart 12.5 mg spironolactone daily.  - Hold off on losartan and consider at the next visit. In the past she was intolerant due to hypotension.  - Would not use SGLT2i given high risk of genital yeast infection - check BMET   2. Frequent PVCs - Suppressed. Continue Amio 100 mg daily.    3. Obesity  Body mass index is 40.6 kg/m.  4. Chronic AF with tachy-brady s/p pacemaker -Rate controlled.  -Continue endoxaban.  Denies bleeding.  - Follows with EP.  Refer to HF Paramedicine to help with medications. Need to get her back on GDMT.  Check BMET/CBC today.  We changed her pharmacy so her son can pick up medications.    Follow up with pharmacy in 2-3 weeks and 6 weeks with APP.    Waneta Martins, NP  08/07/2020 9:18 AM  Advanced [facility] Failure [address] [address] [phone] (office) [phone] (fax)

## 2020-08-07 ENCOUNTER — Encounter (HOSPITAL_COMMUNITY): Payer: Self-pay

## 2020-08-07 ENCOUNTER — Other Ambulatory Visit: Payer: Self-pay

## 2020-08-07 ENCOUNTER — Ambulatory Visit (HOSPITAL_COMMUNITY)
Admit: 2020-08-07 | Discharge: 2020-08-07 | Disposition: A | Discharge status: Home / Self Care | Payer: Medicare HMO | Attending: Adult Health | Admitting: Adult Health

## 2020-08-07 VITALS — BP 128/82 | HR 80 | Wt 187.6 lb

## 2020-08-07 DIAGNOSIS — I493 Ventricular premature depolarization: Secondary | ICD-10-CM | POA: Diagnosis not present

## 2020-08-07 DIAGNOSIS — Z9989 Dependence on other enabling machines and devices: Secondary | ICD-10-CM | POA: Insufficient documentation

## 2020-08-07 DIAGNOSIS — Z79899 Other long term (current) drug therapy: Secondary | ICD-10-CM | POA: Insufficient documentation

## 2020-08-07 DIAGNOSIS — Z950 Presence of cardiac pacemaker: Secondary | ICD-10-CM | POA: Insufficient documentation

## 2020-08-07 DIAGNOSIS — G4733 Obstructive sleep apnea (adult) (pediatric): Secondary | ICD-10-CM | POA: Insufficient documentation

## 2020-08-07 DIAGNOSIS — Z6841 Body Mass Index (BMI) 40.0 and over, adult: Secondary | ICD-10-CM | POA: Diagnosis not present

## 2020-08-07 DIAGNOSIS — Z9981 Dependence on supplemental oxygen: Secondary | ICD-10-CM | POA: Diagnosis not present

## 2020-08-07 DIAGNOSIS — I482 Chronic atrial fibrillation, unspecified: Secondary | ICD-10-CM | POA: Diagnosis not present

## 2020-08-07 DIAGNOSIS — I519 Heart disease, unspecified: Secondary | ICD-10-CM

## 2020-08-07 DIAGNOSIS — M170 Bilateral primary osteoarthritis of knee: Secondary | ICD-10-CM | POA: Diagnosis not present

## 2020-08-07 DIAGNOSIS — S81801D Unspecified open wound, right lower leg, subsequent encounter: Secondary | ICD-10-CM | POA: Diagnosis not present

## 2020-08-07 DIAGNOSIS — Z9049 Acquired absence of other specified parts of digestive tract: Secondary | ICD-10-CM | POA: Diagnosis not present

## 2020-08-07 DIAGNOSIS — M1990 Unspecified osteoarthritis, unspecified site: Secondary | ICD-10-CM | POA: Diagnosis not present

## 2020-08-07 DIAGNOSIS — Z7901 Long term (current) use of anticoagulants: Secondary | ICD-10-CM | POA: Insufficient documentation

## 2020-08-07 DIAGNOSIS — I110 Hypertensive heart disease with heart failure: Secondary | ICD-10-CM | POA: Diagnosis not present

## 2020-08-07 DIAGNOSIS — Z888 Allergy status to other drugs, medicaments and biological substances status: Secondary | ICD-10-CM | POA: Insufficient documentation

## 2020-08-07 DIAGNOSIS — Z880 Allergy status to penicillin: Secondary | ICD-10-CM | POA: Insufficient documentation

## 2020-08-07 DIAGNOSIS — I0981 Rheumatic heart failure: Secondary | ICD-10-CM | POA: Diagnosis not present

## 2020-08-07 DIAGNOSIS — E7800 Pure hypercholesterolemia, unspecified: Secondary | ICD-10-CM | POA: Insufficient documentation

## 2020-08-07 DIAGNOSIS — Z8673 Personal history of transient ischemic attack (TIA), and cerebral infarction without residual deficits: Secondary | ICD-10-CM | POA: Insufficient documentation

## 2020-08-07 DIAGNOSIS — Z8249 Family history of ischemic heart disease and other diseases of the circulatory system: Secondary | ICD-10-CM | POA: Diagnosis not present

## 2020-08-07 DIAGNOSIS — I495 Sick sinus syndrome: Secondary | ICD-10-CM | POA: Insufficient documentation

## 2020-08-07 DIAGNOSIS — I428 Other cardiomyopathies: Secondary | ICD-10-CM | POA: Diagnosis not present

## 2020-08-07 DIAGNOSIS — I081 Rheumatic disorders of both mitral and tricuspid valves: Secondary | ICD-10-CM | POA: Diagnosis not present

## 2020-08-07 DIAGNOSIS — Z886 Allergy status to analgesic agent status: Secondary | ICD-10-CM | POA: Insufficient documentation

## 2020-08-07 DIAGNOSIS — I5022 Chronic systolic (congestive) heart failure: Secondary | ICD-10-CM | POA: Insufficient documentation

## 2020-08-07 DIAGNOSIS — Z87891 Personal history of nicotine dependence: Secondary | ICD-10-CM | POA: Insufficient documentation

## 2020-08-07 DIAGNOSIS — J449 Chronic obstructive pulmonary disease, unspecified: Secondary | ICD-10-CM | POA: Diagnosis not present

## 2020-08-07 DIAGNOSIS — I872 Venous insufficiency (chronic) (peripheral): Secondary | ICD-10-CM | POA: Diagnosis not present

## 2020-08-07 DIAGNOSIS — I5023 Acute on chronic systolic (congestive) heart failure: Secondary | ICD-10-CM | POA: Diagnosis not present

## 2020-08-07 LAB — BASIC METABOLIC PANEL
Anion gap: 7 (ref 5–15)
BUN: 12 mg/dL (ref 8–23)
CO2: 31 mmol/L (ref 22–32)
Calcium: 9.3 mg/dL (ref 8.9–10.3)
Chloride: 102 mmol/L (ref 98–111)
Creatinine, Ser: 0.92 mg/dL (ref 0.44–1.00)
GFR calc Af Amer: >60 mL/min (ref >60)
GFR calc non Af Amer: >60 mL/min (ref >60)
Glucose, Bld: 101 mg/dL — ABNORMAL HIGH (ref 70–99)
Potassium: 4.1 mmol/L (ref 3.5–5.1)
Sodium: 140 mmol/L (ref 135–145)

## 2020-08-07 LAB — CBC
HCT: 40.6 % (ref 36.0–46.0)
Hemoglobin: 12.3 g/dL (ref 12.0–15.0)
MCH: 29.7 pg (ref 26.0–34.0)
MCHC: 30.3 g/dL (ref 30.0–36.0)
MCV: 98.1 fL (ref 80.0–100.0)
Platelets: 106 10*3/uL — ABNORMAL LOW (ref 150–400)
RBC: 4.14 MIL/uL (ref 3.87–5.11)
RDW: 16.5 % — ABNORMAL HIGH (ref 11.5–15.5)
WBC: 3.4 10*3/uL — ABNORMAL LOW (ref 4.0–10.5)
nRBC: 0 % (ref 0.0–0.2)

## 2020-08-07 MED ORDER — SPIRONOLACTONE 25 MG PO TABS: 25.0000 mg | ORAL_TABLET | Freq: Every day | ORAL | 0 refills | Status: DC

## 2020-08-07 MED ORDER — DIGOXIN 125 MCG PO TABS
0.1250 mg | ORAL_TABLET | Freq: Every day | ORAL | 3 refills | Status: DC
Start: 2020-08-07 — End: 2020-09-07

## 2020-08-07 NOTE — Patient Instructions (Addendum)
START Digoxin 0.125 mg, one tab daily  START Spironolactone 12.5 mg, one half tab daily INCREASE Torsemide to 80 mg (4 tabs) twice a day for two days, then resume normal dose of 60 mg twice a day  Labs today We will only contact you if something comes back abnormal or we need to make some changes. Otherwise no news is good news!  Your physician recommends that you schedule a follow-up appointment in: 2 weeks pharmacy team  Your physician recommends that you schedule a follow-up appointment in: 6 weeks in the Advanced Practitioners (PA/NP) Clinic   You have been referred to [facility] Para Medicine for further CHF management in the comfort of your home. A team member will be in contact with you to arrange a home visit  Do the following things EVERYDAY: 1) Weigh yourself in the morning before breakfast. Write it down and keep it in a log. 2) Take your medicines as prescribed 3) Eat low salt foods--Limit salt (sodium) to 2000 mg per day.  4) Stay as active as you can everyday 5) Limit all fluids for the day to less than 2 liters  If you have any questions or concerns before your next appointment please send [place] a message through [place] or call our office at [phone].    TO LEAVE A MESSAGE FOR THE NURSE SELECT OPTION 2, PLEASE LEAVE A MESSAGE INCLUDING: . YOUR NAME . DATE OF BIRTH . CALL BACK NUMBER . REASON FOR CALL**this is important as we prioritize the call backs  YOU WILL RECEIVE A CALL BACK THE SAME DAY AS LONG AS YOU CALL BEFORE 4:00 PM

## 2020-08-08 ENCOUNTER — Telehealth (HOSPITAL_COMMUNITY): Payer: Self-pay | Admitting: Licensed Clinical Social Worker

## 2020-08-08 DIAGNOSIS — I110 Hypertensive heart disease with heart failure: Secondary | ICD-10-CM | POA: Diagnosis not present

## 2020-08-08 DIAGNOSIS — M170 Bilateral primary osteoarthritis of knee: Secondary | ICD-10-CM | POA: Diagnosis not present

## 2020-08-08 DIAGNOSIS — I0981 Rheumatic heart failure: Secondary | ICD-10-CM | POA: Diagnosis not present

## 2020-08-08 DIAGNOSIS — I482 Chronic atrial fibrillation, unspecified: Secondary | ICD-10-CM | POA: Diagnosis not present

## 2020-08-08 DIAGNOSIS — I872 Venous insufficiency (chronic) (peripheral): Secondary | ICD-10-CM | POA: Diagnosis not present

## 2020-08-08 DIAGNOSIS — I5023 Acute on chronic systolic (congestive) heart failure: Secondary | ICD-10-CM | POA: Diagnosis not present

## 2020-08-08 DIAGNOSIS — I081 Rheumatic disorders of both mitral and tricuspid valves: Secondary | ICD-10-CM | POA: Diagnosis not present

## 2020-08-08 DIAGNOSIS — J449 Chronic obstructive pulmonary disease, unspecified: Secondary | ICD-10-CM | POA: Diagnosis not present

## 2020-08-08 DIAGNOSIS — S81801D Unspecified open wound, right lower leg, subsequent encounter: Secondary | ICD-10-CM | POA: Diagnosis not present

## 2020-08-08 NOTE — Telephone Encounter (Signed)
CSW received paramedicine referral for pt to help with medications at home- attempted to call to complete assessment but unable to reach- left VM requesting return call  Burna Sis, LCSW Clinical Social Worker [facility] Desk#: [phone] Cell#: [phone]

## 2020-08-10 ENCOUNTER — Telehealth (HOSPITAL_COMMUNITY): Payer: Self-pay | Admitting: Licensed Clinical Social Worker

## 2020-08-10 NOTE — Telephone Encounter (Signed)
Paramedicine Initial Assessment:  Housing:  In what kind of housing do you live? House/apt/trailer/shelter? house  Do you live with anyone? Husband, 2 grandchildren, son  Are you currently worried about losing your housing? no  Within the past 12 months have you ever stayed outside, in a car, tent, a shelter, or temporarily with someone? no  Within the past 12 months have you been unable to get utilities when it was really needed? no  Social:  What is your current marital status? married  Do you have any children? 2 daughters and a son who live locally   Food:  Within the past 12 months were you ever worried that food would run out before you got money to buy more? no  Within the past 99months have you run out of food and didn't have money to buy more? No  Get food stamps currently- reports they get a lot because the kids are in the home.  Income:  What is your current source of income? Gets retirement income  How hard is it for you to pay for the basics like food housing, medical care, and utilities? Somewhat hard  Insurance:  Are you currently insured? yes  Do you have prescription coverage? Yes- also gets Extra Help to help with medications  If no insurance, have you applied for coverage (Medicaid, disability, marketplace etc)? n/a  Transportation:  Do you have transportation to your medical appointments? Yes- son takes her to appts.  In the past 12 months has lack of transportation kept you from medical appts or from getting medications? no  In the past 12 months has lack of transportation kept you from meetings, work, or getting things you needed? no   Daily Health Needs: Do you have a working scale at home? yes  How do you manage your medications at home? Pharmacy sends her pillpacks  Do you ever take your medications differently than prescribed? Yes- sometimes forgets to take it  Do you have issues affording your medications? No- reports she gets extra  help  Do you have any concerns with mobility at home? Has trouble walking- has been getting PT in the home- unclear if this will continue- couldn't say where the PT was from  Do you use any assistive devices at home or have PCS at home? Cane, walker, bedside commode, and oxygen  Do you have a PCP? Sees Dr. Levert Feinstein   Are there any additional barriers you see to getting the care you need?  no  CSW will continue to follow through paramedicine program and assist as needed.  Misty Sis, LCSW Clinical Social Worker [facility] Desk#: [phone] Cell#: [phone]

## 2020-08-15 DIAGNOSIS — I0981 Rheumatic heart failure: Secondary | ICD-10-CM | POA: Diagnosis not present

## 2020-08-15 DIAGNOSIS — I081 Rheumatic disorders of both mitral and tricuspid valves: Secondary | ICD-10-CM | POA: Diagnosis not present

## 2020-08-15 DIAGNOSIS — I872 Venous insufficiency (chronic) (peripheral): Secondary | ICD-10-CM | POA: Diagnosis not present

## 2020-08-15 DIAGNOSIS — I110 Hypertensive heart disease with heart failure: Secondary | ICD-10-CM | POA: Diagnosis not present

## 2020-08-15 DIAGNOSIS — I5023 Acute on chronic systolic (congestive) heart failure: Secondary | ICD-10-CM | POA: Diagnosis not present

## 2020-08-15 DIAGNOSIS — S81801D Unspecified open wound, right lower leg, subsequent encounter: Secondary | ICD-10-CM | POA: Diagnosis not present

## 2020-08-15 DIAGNOSIS — J449 Chronic obstructive pulmonary disease, unspecified: Secondary | ICD-10-CM | POA: Diagnosis not present

## 2020-08-15 DIAGNOSIS — M170 Bilateral primary osteoarthritis of knee: Secondary | ICD-10-CM | POA: Diagnosis not present

## 2020-08-15 DIAGNOSIS — I482 Chronic atrial fibrillation, unspecified: Secondary | ICD-10-CM | POA: Diagnosis not present

## 2020-08-16 DIAGNOSIS — R0902 Hypoxemia: Secondary | ICD-10-CM | POA: Diagnosis not present

## 2020-08-16 DIAGNOSIS — G4733 Obstructive sleep apnea (adult) (pediatric): Secondary | ICD-10-CM | POA: Diagnosis not present

## 2020-08-17 ENCOUNTER — Telehealth (HOSPITAL_COMMUNITY): Payer: Self-pay

## 2020-08-17 NOTE — Telephone Encounter (Signed)
I called Ms Echeverry to schedule an appointment. She did not answer so I left a message with my information and requested she call me back. I will reach out again tomorrow if I don not hear back from her.   Jacqualine Code, EMT

## 2020-08-18 ENCOUNTER — Other Ambulatory Visit (HOSPITAL_COMMUNITY): Payer: Self-pay

## 2020-08-18 DIAGNOSIS — J449 Chronic obstructive pulmonary disease, unspecified: Secondary | ICD-10-CM | POA: Diagnosis not present

## 2020-08-18 DIAGNOSIS — M170 Bilateral primary osteoarthritis of knee: Secondary | ICD-10-CM | POA: Diagnosis not present

## 2020-08-18 DIAGNOSIS — S81801D Unspecified open wound, right lower leg, subsequent encounter: Secondary | ICD-10-CM | POA: Diagnosis not present

## 2020-08-18 DIAGNOSIS — I872 Venous insufficiency (chronic) (peripheral): Secondary | ICD-10-CM | POA: Diagnosis not present

## 2020-08-18 DIAGNOSIS — I5023 Acute on chronic systolic (congestive) heart failure: Secondary | ICD-10-CM | POA: Diagnosis not present

## 2020-08-18 DIAGNOSIS — I081 Rheumatic disorders of both mitral and tricuspid valves: Secondary | ICD-10-CM | POA: Diagnosis not present

## 2020-08-18 DIAGNOSIS — I0981 Rheumatic heart failure: Secondary | ICD-10-CM | POA: Diagnosis not present

## 2020-08-18 DIAGNOSIS — I482 Chronic atrial fibrillation, unspecified: Secondary | ICD-10-CM | POA: Diagnosis not present

## 2020-08-18 DIAGNOSIS — I110 Hypertensive heart disease with heart failure: Secondary | ICD-10-CM | POA: Diagnosis not present

## 2020-08-18 NOTE — Progress Notes (Signed)
Paramedicine Encounter    Patient ID: Misty Gray, female    DOB: Sep 11, 1951, 69 y.o.   MRN: [id number]   Patient Care Team: Latrelle Dodrill, MD as PCP - General (Family Medicine) [name], MD (Cardiology)  Patient Active Problem List   Diagnosis Date Noted  . CHF exacerbation (HCC) 07/12/2020  . Hospital discharge follow-up 11/17/2019  . Onychomycosis 11/17/2019  . Sepsis due to pneumonia (HCC) 11/08/2019  . Abdominal wall hernia 11/05/2017  . Anticoagulated 11/05/2017  . Rash and nonspecific skin eruption 09/15/2017  . Ventral hernia without obstruction or gangrene 08/15/2017  . Skin irritation 07/23/2017  . Chronic venous stasis dermatitis 12/26/2016  . Urinary tract infection without hematuria   . Frequent PVCs   . Hypokalemia   . Housing problems 11/23/2016  . Chronic atrial fibrillation (HCC) 11/01/2016  . Hypotension 10/31/2016  . Special screening for malignant neoplasms, colon 08/12/2016  . Heme positive stool 08/09/2016  . Intertrigo 08/09/2016  . Chronic anticoagulation 04/26/2016  . Chronic venous insufficiency   . Preventative health care 10/05/2014  . Breast mass, left 10/05/2014  . Osteopenia 09/15/2014  . Hypertension 01/04/2014  . Chronic systolic dysfunction of left ventricle 05/05/2013  . At high risk for falls 02/11/2013  . Vitamin D deficiency 07/15/2012  . Sinoatrial node dysfunction (HCC) 05/08/2011  . Pacemaker 08/10/2009  . Allergic rhinitis 05/31/2008  . Morbid obesity (HCC) 02/12/2007  . GASTROESOPHAGEAL REFLUX, NO ESOPHAGITIS 02/12/2007  . Female stress incontinence 02/12/2007  . Seizure disorder (HCC) 02/12/2007  . OSA (obstructive sleep apnea) 02/12/2007    Current Outpatient Medications:  .  amiodarone (PACERONE) 200 MG tablet, Take 0.5 tablets (100 mg total) by mouth daily., Disp: 45 tablet, Rfl: 3 .  cholecalciferol (VITAMIN D) 25 MCG (1000 UNIT) tablet, TAKE 1 TABLET BY MOUTH ONCE DAILY., Disp: 28 tablet, Rfl: 11 .   digoxin (LANOXIN) 0.125 MG tablet, Take 1 tablet (0.125 mg total) by mouth daily., Disp: 90 tablet, Rfl: 3 .  edoxaban (SAVAYSA) 60 MG TABS tablet, Take 60 mg by mouth daily., Disp: 30 tablet, Rfl: 11 .  omeprazole (PRILOSEC) 20 MG capsule, TAKE (1) CAPSULE BY MOUTH ONCE DAILY., Disp: 28 capsule, Rfl: 5 .  PHENobarbital (LUMINAL) 64.8 MG tablet, TAKE 1 TABLET BY MOUTH TWICE DAILY, Disp: 56 tablet, Rfl: 5 .  potassium chloride (KLOR-CON) 10 MEQ tablet, TAKE 4 TABLETS BY MOUTH THREE TIMES DAILY (MORNING, NOON, AND NIGHT), Disp: 336 tablet, Rfl: 11 .  spironolactone (ALDACTONE) 25 MG tablet, Take 1 tablet (25 mg total) by mouth daily., Disp: 30 tablet, Rfl: 0 .  torsemide (DEMADEX) 20 MG tablet, Take 3 tablets (60 mg total) by mouth 2 (two) times daily., Disp: 180 tablet, Rfl: 0 .  triamcinolone cream (KENALOG) 0.5 %, Apply topically 3 (three) times daily., Disp: 30 g, Rfl: 0 .  fluticasone (FLONASE) 50 MCG/ACT nasal spray, Place 2 sprays into both nostrils daily as needed. For congestion., Disp: 16 g, Rfl: 1 Allergies  Allergen Reactions  . Aspirin Hives and Rash  . Penicillins Rash and Other (See Comments)    Has patient had a PCN reaction causing immediate rash, facial/tongue/throat swelling, SOB or lightheadedness with hypotension: Yes Has patient had a PCN reaction causing severe rash involving mucus membranes or skin necrosis: No Has patient had a PCN reaction that required hospitalization No Has patient had a PCN reaction occurring within the last 10 years: Yes If all of the above answers are "NO", then may proceed with Cephalosporin use.  Patient has tolerated cephalosporins in 2017.  . Tape Other (See Comments)    NO PAPER TAPE-MUST BE MEDICAL TAPE - unknown reaction NO PAPER TAPE-MUST BE MEDICAL TAPE - unknown reaction  . Wool Alcohol [Lanolin] Hives  . Amoxicillin Rash    Patient has tolerated cephalosporins  . Ampicillin Itching and Rash    Patient has tolerated cephalosporins       Social History   Socioeconomic History  . Marital status: Married    Spouse name: [name]  . Number of children: 4  . Years of education: 74  . Highest education level: Not on file  Occupational History  . Occupation: disabled  Tobacco Use  . Smoking status: Former Smoker    Packs/day: 1.00    Years: 7.00    Pack years: 7.00    Types: Cigarettes    Quit date: 03/16/1969    Years since quitting: 51.4  . Smokeless tobacco: Never Used  Vaping Use  . Vaping Use: Never used  Substance and Sexual Activity  . Alcohol use: No  . Drug use: No  . Sexual activity: Not Currently    Birth control/protection: Post-menopausal  Other Topics Concern  . Not on file  Social History Narrative   Lives with husband [name] lives with them as do his 2 children   Daughter, [name], and her 3 children live in [place] -  [name] is incarcerated   Nephew, Kosha Jaquith lives with them   Daughter, [name], in [facility] [name] [address]   Moved June 2013 to a better home on [facility].      Lives with husband, son, grandson, granddaughter, nephew.   Social Determinants of Health   Financial Resource Strain: Medium Risk  . Difficulty of Paying Living Expenses: Somewhat hard  Food Insecurity: No Food Insecurity  . Worried About Programme researcher, broadcasting/film/video in the Last Year: Never true  . Ran Out of Food in the Last Year: Never true  Transportation Needs: No Transportation Needs  . Lack of Transportation (Medical): No  . Lack of Transportation (Non-Medical): No  Physical Activity:   . Days of Exercise per Week: Not on file  . Minutes of Exercise per Session: Not on file  Stress:   . Feeling of Stress : Not on file  Social Connections:   . Frequency of Communication with Friends and Family: Not on file  . Frequency of Social Gatherings with Friends and Family: Not on file  . Attends Religious Services: Not on file  . Active Member of Clubs  or Organizations: Not on file  . Attends Banker Meetings: Not on file  . Marital Status: Not on file  Intimate Partner Violence:   . Fear of Current or Ex-Partner: Not on file  . Emotionally Abused: Not on file  . Physically Abused: Not on file  . Sexually Abused: Not on file    Physical Exam Cardiovascular:     Rate and Rhythm: Normal rate and regular rhythm.     Pulses: Normal pulses.  Pulmonary:     Effort: Pulmonary effort is normal.     Breath sounds: Normal breath sounds.  Musculoskeletal:        General: Normal range of motion.     Right lower leg: No edema.     Left lower leg: No edema.  Skin:    General: Skin is warm and dry.  Capillary Refill: Capillary refill takes less than 2 seconds.  Neurological:     Mental Status: She is alert and oriented to person, place, and time.  Psychiatric:        Mood and Affect: Mood normal.         Future Appointments  Date Time Provider Department Center  09/05/2020  2:00 PM [facility] PHARMACY [facility] None  09/11/2020  3:45 PM Freddie Breech, DPM [facility]  09/18/2020  1:30 PM [facility] PA/NP [facility] None    BP 112/69 (BP Location: Left Arm, Patient Position: Sitting, Cuff Size: Normal)   Pulse 62   Resp 16   Wt 182 lb 12.8 oz (82.9 kg)   SpO2 96%   BMI 39.56 kg/m   Weight yesterday- n/a Last visit weight- 187 lb  Ms Nedd was seen at home today and reported feeling well. She denied chest pain, SOB, headache, dizziness, orthopnea, fever or cough over the past week. She stated she has been compliant with her medications and her weight is lower today than when she was at the clinic last. There was concern by the clinic staff that she did not have the proper medications in her blister packs however upon verification, all appropriate medications and doses were present. She reported that she does not weigh daily because she does not have a level place in her home however I was able to find a place in  her room that will work well. She stated she would weigh here moving forward. Nothing further was needed at this time. I will follow up next week.   Jacqualine Code, EMT 08/18/20  ACTION: Home visit completed Next visit planned for 1 week

## 2020-08-23 DIAGNOSIS — S81801D Unspecified open wound, right lower leg, subsequent encounter: Secondary | ICD-10-CM | POA: Diagnosis not present

## 2020-08-23 DIAGNOSIS — I5023 Acute on chronic systolic (congestive) heart failure: Secondary | ICD-10-CM | POA: Diagnosis not present

## 2020-08-23 DIAGNOSIS — M170 Bilateral primary osteoarthritis of knee: Secondary | ICD-10-CM | POA: Diagnosis not present

## 2020-08-23 DIAGNOSIS — I081 Rheumatic disorders of both mitral and tricuspid valves: Secondary | ICD-10-CM | POA: Diagnosis not present

## 2020-08-23 DIAGNOSIS — I482 Chronic atrial fibrillation, unspecified: Secondary | ICD-10-CM | POA: Diagnosis not present

## 2020-08-23 DIAGNOSIS — I110 Hypertensive heart disease with heart failure: Secondary | ICD-10-CM | POA: Diagnosis not present

## 2020-08-23 DIAGNOSIS — J449 Chronic obstructive pulmonary disease, unspecified: Secondary | ICD-10-CM | POA: Diagnosis not present

## 2020-08-23 DIAGNOSIS — I0981 Rheumatic heart failure: Secondary | ICD-10-CM | POA: Diagnosis not present

## 2020-08-23 DIAGNOSIS — I872 Venous insufficiency (chronic) (peripheral): Secondary | ICD-10-CM | POA: Diagnosis not present

## 2020-08-23 NOTE — Progress Notes (Signed)
PCP:  Latrelle Dodrill, MD- Internal Medicine     Cardiologist:  No primary care provider on file. Primary HF: [name] EP: Dr Johney Frame   HPI:  Misty Gray is a 69y/o woman with history of morbid obesity, OSA, chronic atrial fibrillation with tachy-brady syndrome s/p PPM, frequent PVCs, systolic HF due to NICM.  Admitted for ADHF in 10/17. Cath with normal coronary arteries and elevated filling pressures. Echo 10/17 EF 30-35% (previously 35-40%). Felt to have possible PVC or RV pacing cardiomyopathy. (She has 81% RV pacing and 15-20 PVCs per minute). Started on amiodarone with suppression of PVCs. Diuresed 41 pounds. Discharge weight 197 lbs.   Admitted 11/28/2016 through 12/06/2016 with marked volume overload. Discharge weight was 195 pounds. Transitioned to torsemide 40 mg in am and 20 mg in pm.  Echo8/2018 LVEF 25-30%, Trivial AI, Mod MR, Severe LAE, Mild RV dilation, Mild RAE, PA peak pressure 31 mm Hg.  Echo 1/20 EF 25-30% (no improvement with PVC suppression)  Admitted 07/2020 with A/C systolic heart failure. Volume overloaded. Diuresed with IV furosemide and transitioned to torsemide 60 mg twice a day. RV pacing 28% of the time by device interrogation this admission.  Very limited GDMT due to inability to tolerate HF meds. Excellent diuresis w/ IV furosemide, overall down 21 lb. Decreased backup pacing rate to 50 bpm from 70 bpm. She is unable to perform remote device transmissions because of her housing situation. Discharge date 07/18/2020. She was supposed to start digoxin, losartan, and spironolactone but never received the medications.   Recently returned to HF Clinic for post hospital HF follow up on 08/07/20. Overall was feeling fine. Limited by joint pain. SOB with exertion. Denied PND. Reported sleeping on large pillow. Appetite was ok. Had [place] food last week but she asked for no seasoning or salt. No fever or chills. Weight at home not right because floor is slanted.  Taking all medications but never received digoxin, losartan, or spironolactone from her pharmacy. Lives with son, husband and grandchildren.   Today she returns to HF clinic for pharmacist medication titration. At last visit with [facility], spironolactone 25 mg daily (notes say 12.5 mg daily, but 25 mg daily was sent to pharmacy) and digoxin 0.125 mg daily were initiated. Overall she is feeling well today. Notes that she tires easy and has to lay down to rest frequently. No dizziness, lightheadedness, chest pain or palpitations. No resting dyspnea. She takes torsemide 60 mg BID and has not needed any extra. Her scale at home is not accurate. Weight is down 2 lbs from last office visit. No PND/orthopnea. 1+ bilateral LEE, which is normal for her. Of note, she recently received the losartan she was supposed to start taking upon discharge so she started taking it in early September. Has done well with that change so far.      HF Medications: Losartan 12.5 mg daily Spironolactone 25 mg daily Digoxin 0.125 mg daily Torsemide 60 mg BID Potassium chloride 40 mEq TID  Has the patient been experiencing any side effects to the medications prescribed?  no  Does the patient have any problems obtaining medications due to transportation or finances?   no  Understanding of regimen: fair Understanding of indications: fair Potential of compliance: good Patient understands to avoid NSAIDs. Patient understands to avoid decongestants.    Pertinent Lab Values: . 08/07/20: Serum creatinine 0.92, BUN 12, Potassium 4.1, Sodium 140,  . BMET today pending.   Vital Signs: . Weight: 185.2 lbs (last clinic weight:  187.6 lbs) . Blood pressure: 98/56  . Heart rate: 64   Assessment: 1. Chronic systolic HF due to NICM. ? PVC induced vs RV pacing. Echo 12/06/2016 EF 10-15%. Cath 10/17 normal coronary arteries. Echo 02/2017 with LVEF 15-20% despite PVC suppression. Echo 07/2017 EF 25-30%.  -Echo 07/2017 LVEF 25-30%,  Trivial AI, Mod MR, Severe LAE, Mild RV dilation, Mild RAE, PA peak pressure 31 mm Hg. Echo 1/20 EF 25-30%. ECHO 2021 EF 25-30% RV moderately reduced.  - NYHAIII. Volume status improved. BMET today pending.   - Vitals: BP 98/56, HR 64. BP limits uptitration of HF meds.  - Continue torsemide 60 mg twice a day.   - Unable to tolerate beta blocker. - Continue spironolactone 25 mg daily.  - Continue losartan 12.5 mg daily. As stated above, patient received this from her pharmacy early September and started taking it (was prescribed on discharge 07/18/20, but never received it before, so had not started previously). Updated medication list to reflect this change.  - Continue digoxin 0.125 mg daily. Needs digoxin level next visit.  - Would not use SGLT2i given high risk of genital yeast infection   2. Frequent PVCs - Suppressed. Continue Amiodarone 100 mg daily.  3. Obesity Body mass index is 40.6 kg/m.  4. Chronic AF with tachy-brady s/p pacemaker -Rate controlled.  -Continue endoxaban. Denies bleeding. -Follows with EP.      Plan: 1) Medication changes: Based on clinical presentation, vital signs and recent labs will make no medication changes today given low BP. Updated medication list to reflect that she is now taking losartan 12.5 mg daily.  2) Labs: BMET pending 3) Follow-up: 2 weeks with [facility]   Karle Plumber, PharmD, BCPS, BCCP, CPP Heart Failure Clinic Pharmacist [phone]

## 2020-08-29 DIAGNOSIS — G4733 Obstructive sleep apnea (adult) (pediatric): Secondary | ICD-10-CM | POA: Diagnosis not present

## 2020-09-05 ENCOUNTER — Telehealth (HOSPITAL_COMMUNITY): Payer: Self-pay | Admitting: Pharmacist

## 2020-09-05 ENCOUNTER — Ambulatory Visit (HOSPITAL_COMMUNITY)
Admission: RE | Admit: 2020-09-05 | Discharge: 2020-09-05 | Disposition: A | Discharge status: Home / Self Care | Payer: Medicare HMO | Source: Ambulatory Visit | Attending: Cardiology | Admitting: Cardiology

## 2020-09-05 ENCOUNTER — Other Ambulatory Visit: Payer: Self-pay

## 2020-09-05 VITALS — BP 98/56 | HR 64 | Wt 185.2 lb

## 2020-09-05 DIAGNOSIS — I482 Chronic atrial fibrillation, unspecified: Secondary | ICD-10-CM | POA: Diagnosis not present

## 2020-09-05 DIAGNOSIS — Z950 Presence of cardiac pacemaker: Secondary | ICD-10-CM | POA: Insufficient documentation

## 2020-09-05 DIAGNOSIS — Z5181 Encounter for therapeutic drug level monitoring: Secondary | ICD-10-CM | POA: Insufficient documentation

## 2020-09-05 DIAGNOSIS — I5023 Acute on chronic systolic (congestive) heart failure: Secondary | ICD-10-CM | POA: Diagnosis not present

## 2020-09-05 DIAGNOSIS — Z6841 Body Mass Index (BMI) 40.0 and over, adult: Secondary | ICD-10-CM | POA: Insufficient documentation

## 2020-09-05 DIAGNOSIS — J449 Chronic obstructive pulmonary disease, unspecified: Secondary | ICD-10-CM | POA: Diagnosis not present

## 2020-09-05 DIAGNOSIS — I428 Other cardiomyopathies: Secondary | ICD-10-CM | POA: Insufficient documentation

## 2020-09-05 DIAGNOSIS — I0981 Rheumatic heart failure: Secondary | ICD-10-CM | POA: Diagnosis not present

## 2020-09-05 DIAGNOSIS — I493 Ventricular premature depolarization: Secondary | ICD-10-CM | POA: Diagnosis not present

## 2020-09-05 DIAGNOSIS — G4733 Obstructive sleep apnea (adult) (pediatric): Secondary | ICD-10-CM | POA: Insufficient documentation

## 2020-09-05 DIAGNOSIS — I872 Venous insufficiency (chronic) (peripheral): Secondary | ICD-10-CM | POA: Diagnosis not present

## 2020-09-05 DIAGNOSIS — Z79899 Other long term (current) drug therapy: Secondary | ICD-10-CM | POA: Diagnosis not present

## 2020-09-05 DIAGNOSIS — S81801D Unspecified open wound, right lower leg, subsequent encounter: Secondary | ICD-10-CM | POA: Diagnosis not present

## 2020-09-05 DIAGNOSIS — I081 Rheumatic disorders of both mitral and tricuspid valves: Secondary | ICD-10-CM | POA: Diagnosis not present

## 2020-09-05 DIAGNOSIS — I110 Hypertensive heart disease with heart failure: Secondary | ICD-10-CM | POA: Diagnosis not present

## 2020-09-05 DIAGNOSIS — I495 Sick sinus syndrome: Secondary | ICD-10-CM | POA: Insufficient documentation

## 2020-09-05 DIAGNOSIS — I5022 Chronic systolic (congestive) heart failure: Secondary | ICD-10-CM | POA: Diagnosis not present

## 2020-09-05 DIAGNOSIS — M170 Bilateral primary osteoarthritis of knee: Secondary | ICD-10-CM | POA: Diagnosis not present

## 2020-09-05 LAB — BASIC METABOLIC PANEL
Anion gap: 9 (ref 5–15)
BUN: 27 mg/dL — ABNORMAL HIGH (ref 8–23)
CO2: 28 mmol/L (ref 22–32)
Calcium: 9.5 mg/dL (ref 8.9–10.3)
Chloride: 103 mmol/L (ref 98–111)
Creatinine, Ser: 0.96 mg/dL (ref 0.44–1.00)
GFR calc Af Amer: >60 mL/min (ref >60)
GFR calc non Af Amer: 60 mL/min — ABNORMAL LOW (ref >60)
Glucose, Bld: 114 mg/dL — ABNORMAL HIGH (ref 70–99)
Potassium: 4.3 mmol/L (ref 3.5–5.1)
Sodium: 140 mmol/L (ref 135–145)

## 2020-09-05 LAB — BRAIN NATRIURETIC PEPTIDE: B Natriuretic Peptide: 1407.3 pg/mL — ABNORMAL HIGH (ref 0.0–100.0)

## 2020-09-05 MED ORDER — TORSEMIDE 20 MG PO TABS: 80.0000 mg | ORAL_TABLET | Freq: Two times a day (BID) | ORAL | 0 refills | Status: DC

## 2020-09-05 MED ORDER — LOSARTAN POTASSIUM 25 MG PO TABS
12.5000 mg | ORAL_TABLET | Freq: Every day | ORAL | 11 refills | Status: DC
Start: 2020-09-05 — End: 2020-12-01

## 2020-09-05 NOTE — Patient Instructions (Addendum)
It was a pleasure seeing you today!  MEDICATIONS: -No medication changes today -Call if you have questions about your medications.  LABS: -We will call you if your labs need attention.  NEXT APPOINTMENT: Return to clinic in 2 weeks with [facility].  In general, to take care of your heart failure: -Limit your fluid intake to 2 Liters (half-gallon) per day.   -Limit your salt intake to ideally 2-3 grams (2000-3000 mg) per day. -Weigh yourself daily and record, and bring that "weight diary" to your next appointment.  (Weight gain of 2-3 pounds in 1 day typically means fluid weight.) -The medications for your heart are to help your heart and help you live longer.   -Please contact us before stopping any of your heart medications.  Call the clinic at [phone] with questions or to reschedule future appointments.

## 2020-09-05 NOTE — Telephone Encounter (Signed)
Reviewed labs from 09/05/20. BMET stable but BNP significantly elevated at 1407.3. Will increase torsemide to 80 mg BID. Repeat BMET at [facility] visit in 2 weeks. Left VM.   Karle Plumber, PharmD, BCPS, BCCP, CPP Heart Failure Clinic Pharmacist [phone]

## 2020-09-07 ENCOUNTER — Other Ambulatory Visit (HOSPITAL_COMMUNITY): Payer: Self-pay

## 2020-09-07 ENCOUNTER — Other Ambulatory Visit (HOSPITAL_COMMUNITY): Payer: Self-pay | Admitting: Internal Medicine

## 2020-09-07 ENCOUNTER — Other Ambulatory Visit (HOSPITAL_COMMUNITY): Payer: Self-pay | Admitting: *Deleted

## 2020-09-07 MED ORDER — DIGOXIN 125 MCG PO TABS: 0.1250 mg | ORAL_TABLET | Freq: Every day | ORAL | 3 refills | Status: DC

## 2020-09-07 MED ORDER — SPIRONOLACTONE 25 MG PO TABS: 25.0000 mg | ORAL_TABLET | Freq: Every day | ORAL | 3 refills | Status: DC

## 2020-09-07 NOTE — Progress Notes (Signed)
Paramedicine Encounter    Patient ID: Misty Gray, female    DOB: 01-Dec-1951, 69 y.o.   MRN: [id number]   Patient Care Team: Latrelle Dodrill, MD as PCP - General (Family Medicine) [name], MD (Cardiology)  Patient Active Problem List   Diagnosis Date Noted  . CHF exacerbation (HCC) 07/12/2020  . Hospital discharge follow-up 11/17/2019  . Onychomycosis 11/17/2019  . Sepsis due to pneumonia (HCC) 11/08/2019  . Abdominal wall hernia 11/05/2017  . Anticoagulated 11/05/2017  . Rash and nonspecific skin eruption 09/15/2017  . Ventral hernia without obstruction or gangrene 08/15/2017  . Skin irritation 07/23/2017  . Chronic venous stasis dermatitis 12/26/2016  . Urinary tract infection without hematuria   . Frequent PVCs   . Hypokalemia   . Housing problems 11/23/2016  . Chronic atrial fibrillation (HCC) 11/01/2016  . Hypotension 10/31/2016  . Special screening for malignant neoplasms, colon 08/12/2016  . Heme positive stool 08/09/2016  . Intertrigo 08/09/2016  . Chronic anticoagulation 04/26/2016  . Chronic venous insufficiency   . Preventative health care 10/05/2014  . Breast mass, left 10/05/2014  . Osteopenia 09/15/2014  . Hypertension 01/04/2014  . Chronic systolic dysfunction of left ventricle 05/05/2013  . At high risk for falls 02/11/2013  . Vitamin D deficiency 07/15/2012  . Sinoatrial node dysfunction (HCC) 05/08/2011  . Pacemaker 08/10/2009  . Allergic rhinitis 05/31/2008  . Morbid obesity (HCC) 02/12/2007  . GASTROESOPHAGEAL REFLUX, NO ESOPHAGITIS 02/12/2007  . Female stress incontinence 02/12/2007  . Seizure disorder (HCC) 02/12/2007  . OSA (obstructive sleep apnea) 02/12/2007    Current Outpatient Medications:  .  amiodarone (PACERONE) 200 MG tablet, Take 0.5 tablets (100 mg total) by mouth daily., Disp: 45 tablet, Rfl: 3 .  cholecalciferol (VITAMIN D) 25 MCG (1000 UNIT) tablet, TAKE 1 TABLET BY MOUTH ONCE DAILY., Disp: 28 tablet, Rfl: 11 .   digoxin (LANOXIN) 0.125 MG tablet, Take 1 tablet (0.125 mg total) by mouth daily., Disp: 90 tablet, Rfl: 3 .  edoxaban (SAVAYSA) 60 MG TABS tablet, Take 60 mg by mouth daily., Disp: 30 tablet, Rfl: 11 .  fluticasone (FLONASE) 50 MCG/ACT nasal spray, Place 2 sprays into both nostrils daily as needed. For congestion., Disp: 16 g, Rfl: 1 .  losartan (COZAAR) 25 MG tablet, Take 0.5 tablets (12.5 mg total) by mouth daily., Disp: 15 tablet, Rfl: 11 .  omeprazole (PRILOSEC) 20 MG capsule, TAKE (1) CAPSULE BY MOUTH ONCE DAILY., Disp: 28 capsule, Rfl: 5 .  PHENobarbital (LUMINAL) 64.8 MG tablet, TAKE 1 TABLET BY MOUTH TWICE DAILY, Disp: 56 tablet, Rfl: 5 .  potassium chloride (KLOR-CON) 10 MEQ tablet, TAKE 4 TABLETS BY MOUTH THREE TIMES DAILY (MORNING, NOON, AND NIGHT), Disp: 336 tablet, Rfl: 11 .  spironolactone (ALDACTONE) 25 MG tablet, Take 1 tablet (25 mg total) by mouth daily., Disp: 90 tablet, Rfl: 3 .  torsemide (DEMADEX) 20 MG tablet, Take 4 tablets (80 mg total) by mouth 2 (two) times daily., Disp: 240 tablet, Rfl: 0 .  triamcinolone cream (KENALOG) 0.5 %, Apply topically 3 (three) times daily., Disp: 30 g, Rfl: 0 Allergies  Allergen Reactions  . Aspirin Hives and Rash  . Penicillins Rash and Other (See Comments)    Has patient had a PCN reaction causing immediate rash, facial/tongue/throat swelling, SOB or lightheadedness with hypotension: Yes Has patient had a PCN reaction causing severe rash involving mucus membranes or skin necrosis: No Has patient had a PCN reaction that required hospitalization No Has patient had a PCN reaction  occurring within the last 10 years: Yes If all of the above answers are "NO", then may proceed with Cephalosporin use.  Patient has tolerated cephalosporins in 2017.  . Tape Other (See Comments)    NO PAPER TAPE-MUST BE MEDICAL TAPE - unknown reaction NO PAPER TAPE-MUST BE MEDICAL TAPE - unknown reaction  . Wool Alcohol [Lanolin] Hives  . Amoxicillin Rash     Patient has tolerated cephalosporins  . Ampicillin Itching and Rash    Patient has tolerated cephalosporins      Social History   Socioeconomic History  . Marital status: Married    Spouse name: [name]  . Number of children: 4  . Years of education: 31  . Highest education level: Not on file  Occupational History  . Occupation: disabled  Tobacco Use  . Smoking status: Former Smoker    Packs/day: 1.00    Years: 7.00    Pack years: 7.00    Types: Cigarettes    Quit date: 03/16/1969    Years since quitting: 51.5  . Smokeless tobacco: Never Used  Vaping Use  . Vaping Use: Never used  Substance and Sexual Activity  . Alcohol use: No  . Drug use: No  . Sexual activity: Not Currently    Birth control/protection: Post-menopausal  Other Topics Concern  . Not on file  Social History Narrative   Lives with husband [name] lives with them as do his 2 children   Daughter, [name], and her 3 children live in [place] -  [name] is incarcerated   Nephew, Makinzi Prieur lives with them   Daughter, [name], in [facility] [name] [address]   Moved June 2013 to a better home on [facility].      Lives with husband, son, grandson, granddaughter, nephew.   Social Determinants of Health   Financial Resource Strain: Medium Risk  . Difficulty of Paying Living Expenses: Somewhat hard  Food Insecurity: No Food Insecurity  . Worried About Programme researcher, broadcasting/film/video in the Last Year: Never true  . Ran Out of Food in the Last Year: Never true  Transportation Needs: No Transportation Needs  . Lack of Transportation (Medical): No  . Lack of Transportation (Non-Medical): No  Physical Activity:   . Days of Exercise per Week: Not on file  . Minutes of Exercise per Session: Not on file  Stress:   . Feeling of Stress : Not on file  Social Connections:   . Frequency of Communication with Friends and Family: Not on file  . Frequency of Social  Gatherings with Friends and Family: Not on file  . Attends Religious Services: Not on file  . Active Member of Clubs or Organizations: Not on file  . Attends Banker Meetings: Not on file  . Marital Status: Not on file  Intimate Partner Violence:   . Fear of Current or Ex-Partner: Not on file  . Emotionally Abused: Not on file  . Physically Abused: Not on file  . Sexually Abused: Not on file    Physical Exam Cardiovascular:     Rate and Rhythm: Normal rate. Rhythm irregular.     Pulses: Normal pulses.  Pulmonary:     Effort: Pulmonary effort is normal.     Breath sounds: Normal breath sounds.  Musculoskeletal:        General: Normal range of motion.     Right lower leg: Edema present.  Left lower leg: Edema present.  Skin:    General: Skin is warm and dry.     Capillary Refill: Capillary refill takes less than 2 seconds.  Neurological:     Mental Status: She is alert and oriented to person, place, and time.  Psychiatric:        Mood and Affect: Mood normal.         Future Appointments  Date Time Provider Department Center  09/11/2020  3:45 PM Freddie Breech, DPM [facility]  09/18/2020  1:30 PM [facility] PA/NP [facility] None    BP (!) 124/43 (BP Location: Left Arm, Patient Position: Sitting, Cuff Size: Large)   Pulse 65   Resp 16   Wt 180 lb 3.2 oz (81.7 kg)   SpO2 99%   BMI 38.99 kg/m   Weight yesterday- n/a Last visit weight- 185 lb  Ms Desouza [facility] seen at [facility] today and reported feeling well. She denied chest pain, headache, dizziness, orthopnea, fever or cough. She was seen at the HF clinic on Tuesday and her torsemide was increased to 80 mg BID. She has been compliant with this change and as a result her weight has decreased 5 lb. Her radial pulse was noted to be irregular so I contacted the clinic and was advised this was normal for her. I had prescriptions of spironolactone and digoxin sent to her pharmacy and confirmed with the  pharmacy that they would increase her torsemide for the next two weeks. The prepackaged medications should arrive tomorrow and I asked that Ms Puryear let me know when the arrive so I can check them tomorrow afternoon. She was understanding and agreeable.   Jacqualine Code, EMT 09/07/20  ACTION: Home visit completed Next visit planned for Friday

## 2020-09-11 ENCOUNTER — Encounter: Payer: Self-pay | Admitting: Podiatry

## 2020-09-11 ENCOUNTER — Ambulatory Visit (INDEPENDENT_AMBULATORY_CARE_PROVIDER_SITE_OTHER): Payer: Medicare HMO | Admitting: Podiatry

## 2020-09-11 ENCOUNTER — Other Ambulatory Visit: Payer: Self-pay

## 2020-09-11 DIAGNOSIS — Q828 Other specified congenital malformations of skin: Secondary | ICD-10-CM | POA: Diagnosis not present

## 2020-09-11 DIAGNOSIS — I739 Peripheral vascular disease, unspecified: Secondary | ICD-10-CM

## 2020-09-11 DIAGNOSIS — M79674 Pain in right toe(s): Secondary | ICD-10-CM

## 2020-09-11 DIAGNOSIS — L84 Corns and callosities: Secondary | ICD-10-CM

## 2020-09-11 DIAGNOSIS — M79675 Pain in left toe(s): Secondary | ICD-10-CM

## 2020-09-11 DIAGNOSIS — B351 Tinea unguium: Secondary | ICD-10-CM

## 2020-09-14 NOTE — Progress Notes (Signed)
Subjective: Misty Gray is a pleasant 69 y.o. female patient seen today painful corn(s) left foot and callus(es) b/l feet and painful mycotic nails b/l.  Pain interferes with ambulation. Aggravating factors include wearing enclosed shoe gear.   She voices no new pedal concerns on today's visit.  Past Medical History:  Diagnosis Date  . Abnormal CT of the head 12/16/1984   R parietal atrophy  . Allergic rhinitis, cause unspecified   . Altered mental status 11/28/2016  . Anemia   . Arthritis    "hands and knees" (09/06/2015)  . Atrial fibrillation (HCC)   . Cellulitis 09/07/2015  . Cellulitis and abscess of leg 09/2016   bilateral  . CHF (congestive heart failure) (HCC)   . Diaphragmatic hernia without mention of obstruction or gangrene   . Dyspnea   . Exertional dyspnea 06/22/12  . Family history of adverse reaction to anesthesia    "daughter fights w/them; just can't relax during OR; stay awake during the OR"  . Fatigue 06/22/2012  . Female stress incontinence   . GERD (gastroesophageal reflux disease)   . Grand mal seizure (HCC)   . H/O hiatal hernia    two removed  . Heart murmur   . High cholesterol   . History of blood transfusion 1956   "related to nose bleed"  . Hypertension   . Influenza A 01/11/2014  . Lichenification and lichen simplex chronicus   . Migraine    "when I was a teenager"  . Morbid obesity (HCC)   . Nonischemic cardiomyopathy (HCC)    EF has normalized-repeat Pending   . On home oxygen therapy    "2L when I'm asleep in bed" (09/06/2015)  . OSA on CPAP   . Pacemaker  st judes    Patient states "this is my third pacemaker"  . Pneumonia 2004; 2011; 11/2011  . Seizures (HCC) since 1981   "can hear you talking but sounds like you are in big tunnel; have them often if not taking RX; last one was 06/17/12" (09/06/2015)  . Shortness of breath 10/24/2010   Qualifier: Diagnosis of  By: [place], Bonnie    . Sick sinus syndrome (HCC)    WITH PRIOR DDD PACEMAKER  IMPLANTATION  . Somnolence   . Stroke [facility]) 1988   "mouth drawed real bad on my left side; found out it was a seizure"  . Syncope and collapse 06/22/12   "hit forehead and left knee"; denies loss of consciousness  . Unspecified venous (peripheral) insufficiency     Patient Active Problem List   Diagnosis Date Noted  . CHF exacerbation (HCC) 07/12/2020  . Hospital discharge follow-up 11/17/2019  . Onychomycosis 11/17/2019  . Sepsis due to pneumonia (HCC) 11/08/2019  . Abdominal wall hernia 11/05/2017  . Anticoagulated 11/05/2017  . Rash and nonspecific skin eruption 09/15/2017  . Ventral hernia without obstruction or gangrene 08/15/2017  . Skin irritation 07/23/2017  . Chronic venous stasis dermatitis 12/26/2016  . Urinary tract infection without hematuria   . Frequent PVCs   . Hypokalemia   . Housing problems 11/23/2016  . Chronic atrial fibrillation (HCC) 11/01/2016  . Hypotension 10/31/2016  . Special screening for malignant neoplasms, colon 08/12/2016  . Heme positive stool 08/09/2016  . Intertrigo 08/09/2016  . Chronic anticoagulation 04/26/2016  . Chronic venous insufficiency   . Preventative health care 10/05/2014  . Breast mass, left 10/05/2014  . Osteopenia 09/15/2014  . Hypertension 01/04/2014  . Chronic systolic dysfunction of left ventricle 05/05/2013  .  At high risk for falls 02/11/2013  . Vitamin D deficiency 07/15/2012  . Sinoatrial node dysfunction (HCC) 05/08/2011  . Pacemaker 08/10/2009  . Allergic rhinitis 05/31/2008  . Morbid obesity (HCC) 02/12/2007  . GASTROESOPHAGEAL REFLUX, NO ESOPHAGITIS 02/12/2007  . Female stress incontinence 02/12/2007  . Seizure disorder (HCC) 02/12/2007  . OSA (obstructive sleep apnea) 02/12/2007    Current Outpatient Medications on File Prior to Visit  Medication Sig Dispense Refill  . amiodarone (PACERONE) 200 MG tablet Take 0.5 tablets (100 mg total) by mouth daily. 45 tablet 3  . cholecalciferol (VITAMIN D) 25 MCG  (1000 UNIT) tablet TAKE 1 TABLET BY MOUTH ONCE DAILY. 28 tablet 11  . digoxin (LANOXIN) 0.125 MG tablet TAKE 1 TABLET BY MOUTH ONCE DAILY. 28 tablet 4  . edoxaban (SAVAYSA) 60 MG TABS tablet Take 60 mg by mouth daily. 30 tablet 11  . fluticasone (FLONASE) 50 MCG/ACT nasal spray Place 2 sprays into both nostrils daily as needed. For congestion. 16 g 1  . losartan (COZAAR) 25 MG tablet Take 0.5 tablets (12.5 mg total) by mouth daily. 15 tablet 11  . omeprazole (PRILOSEC) 20 MG capsule TAKE (1) CAPSULE BY MOUTH ONCE DAILY. 28 capsule 5  . PHENobarbital (LUMINAL) 64.8 MG tablet TAKE 1 TABLET BY MOUTH TWICE DAILY 56 tablet 5  . potassium chloride (KLOR-CON) 10 MEQ tablet TAKE 4 TABLETS BY MOUTH THREE TIMES DAILY (MORNING, NOON, AND NIGHT) 336 tablet 11  . spironolactone (ALDACTONE) 25 MG tablet TAKE 1 TABLET BY MOUTH ONCE DAILY. 28 tablet 4  . torsemide (DEMADEX) 20 MG tablet Take 4 tablets (80 mg total) by mouth 2 (two) times daily. 240 tablet 0  . triamcinolone cream (KENALOG) 0.5 % Apply topically 3 (three) times daily. 30 g 0   No current facility-administered medications on file prior to visit.    Allergies  Allergen Reactions  . Aspirin Hives and Rash  . Penicillins Rash and Other (See Comments)    Has patient had a PCN reaction causing immediate rash, facial/tongue/throat swelling, SOB or lightheadedness with hypotension: Yes Has patient had a PCN reaction causing severe rash involving mucus membranes or skin necrosis: No Has patient had a PCN reaction that required hospitalization No Has patient had a PCN reaction occurring within the last 10 years: Yes If all of the above answers are "NO", then may proceed with Cephalosporin use.  Patient has tolerated cephalosporins in 2017.  . Tape Other (See Comments)    NO PAPER TAPE-MUST BE MEDICAL TAPE - unknown reaction NO PAPER TAPE-MUST BE MEDICAL TAPE - unknown reaction  . Wool Alcohol [Lanolin] Hives  . Amoxicillin Rash    Patient has  tolerated cephalosporins  . Ampicillin Itching and Rash    Patient has tolerated cephalosporins    Objective: Physical Exam  General: Misty Gray is a pleasant 69 y.o. Caucasian female, in NAD. AAO x 3.   Vascular:  Neurovascular status unchanged b/l lower extremities. Capillary refill time to digits immediate b/l. Faintly palpable pedal pulses b/l. Pedal hair absent. Lower extremity skin temperature gradient warm to cool. Dependent rubor noted b/l lower extremities. +1 pitting edema b/l lower extremities. Varicosities present b/l.  Dermatological:  Pedal skin is thin shiny, atrophic b/l lower extremities, L 3rd toe and R hallux. No open wounds bilaterally. No interdigital macerations bilaterally. Toenails 1-5 b/l elongated, discolored, dystrophic, thickened, crumbly with subungual debris and tenderness to dorsal palpation. Hyperkeratotic lesion(s) L hallux, L 3rd toe and R hallux.  No erythema, no  edema, no drainage, no flocculence. Porokeratotic lesion(s) submet head 1 right foot and submet head 3 left foot. No erythema, no edema, no drainage, no flocculence.  Musculoskeletal:  Normal muscle strength 5/5 to all lower extremity muscle groups bilaterally. No pain crepitus or joint limitation noted with ROM b/l. Hammertoes noted to the L 2nd toe and R 2nd toe.  Neurological:  Protective sensation decreased with 10 gram monofilament b/l. Vibratory sensation decreased b/l. Proprioception intact bilaterally. Clonus negative b/l.  Assessment and Plan:  1. Pain due to onychomycosis of toenails of both feet   2. Corns and callosities   3. Porokeratosis   4. PVD (peripheral vascular disease) (HCC)    -Examined patient. -No new findings. No new orders. -Toenails 1-5 b/l were debrided in length and girth with sterile nail nippers and dremel without iatrogenic bleeding.  -Corn(s) L 3rd toe and callus(es) L hallux and R hallux were pared utilizing sterile scalpel blade without incident. Total  number debrided =3. -Painful porokeratotic lesion(s) submet head 1 right foot and submet head 3 left foot pared and enucleated with sterile scalpel blade without incident. -Patient to report any pedal injuries to medical professional immediately. -Patient to continue soft, supportive shoe gear daily. -Patient/POA to call should there be question/concern in the interim.  Return in about 3 months (around 12/11/2020).  Freddie Breech, DPM

## 2020-09-15 DIAGNOSIS — R0902 Hypoxemia: Secondary | ICD-10-CM | POA: Diagnosis not present

## 2020-09-15 DIAGNOSIS — G4733 Obstructive sleep apnea (adult) (pediatric): Secondary | ICD-10-CM | POA: Diagnosis not present

## 2020-09-18 ENCOUNTER — Inpatient Hospital Stay (HOSPITAL_COMMUNITY)
Admission: RE | Admit: 2020-09-18 | Discharge: 2020-09-18 | Disposition: A | Discharge status: Home / Self Care | Payer: Medicare HMO | Source: Ambulatory Visit

## 2020-09-22 ENCOUNTER — Other Ambulatory Visit: Payer: Self-pay

## 2020-09-22 ENCOUNTER — Ambulatory Visit
Admission: RE | Admit: 2020-09-22 | Discharge: 2020-09-22 | Disposition: A | Discharge status: Home / Self Care | Payer: Medicare HMO | Source: Ambulatory Visit | Attending: Family Medicine | Admitting: Family Medicine

## 2020-09-22 ENCOUNTER — Ambulatory Visit (INDEPENDENT_AMBULATORY_CARE_PROVIDER_SITE_OTHER): Payer: Medicare HMO | Admitting: Family Medicine

## 2020-09-22 ENCOUNTER — Other Ambulatory Visit (HOSPITAL_COMMUNITY): Payer: Self-pay

## 2020-09-22 VITALS — BP 102/68 | HR 64 | Wt 183.0 lb

## 2020-09-22 DIAGNOSIS — M7989 Other specified soft tissue disorders: Secondary | ICD-10-CM | POA: Diagnosis not present

## 2020-09-22 DIAGNOSIS — S6992XA Unspecified injury of left wrist, hand and finger(s), initial encounter: Secondary | ICD-10-CM | POA: Diagnosis not present

## 2020-09-22 NOTE — Assessment & Plan Note (Addendum)
No obvious deformity or bony abnormality on physical exam.  Certainly some soft tissue swelling.  No significant lacerations concerning for tendinous injuries.  ROM limited only by discomfort but she was able to demonstrate good finger flexion and extension. -Follow-up hand x-ray -Tylenol for pain -Despite bruising, continue blood thinners no concern for compartment syndrome at this time.   Called and discussed x-ray results with Misty Gray.  She was informed that the x-rays did demonstrate the possibility of a small fracture at the base of her thumb.  She did not demonstrate significant tenderness to that area during our exam today.  She continues report that her thumb is functioning well and its primarily her pinky that is the troublesome digit.  She was encouraged to use an Ace wrap for compression.  She was encouraged to return to clinic in about a week to ensure her range of motion significantly improved with the reduced edema/inflammation.

## 2020-09-22 NOTE — Progress Notes (Signed)
Paramedicine Encounter    Patient ID: Misty Gray, female    DOB: 1951-07-10, 69 y.o.   MRN: [id number]   Patient Care Team: Latrelle Dodrill, MD as PCP - General (Family Medicine) [name], MD (Cardiology)  Patient Active Problem List   Diagnosis Date Noted  . CHF exacerbation (HCC) 07/12/2020  . Hospital discharge follow-up 11/17/2019  . Onychomycosis 11/17/2019  . Sepsis due to pneumonia (HCC) 11/08/2019  . Abdominal wall hernia 11/05/2017  . Anticoagulated 11/05/2017  . Rash and nonspecific skin eruption 09/15/2017  . Ventral hernia without obstruction or gangrene 08/15/2017  . Skin irritation 07/23/2017  . Chronic venous stasis dermatitis 12/26/2016  . Urinary tract infection without hematuria   . Frequent PVCs   . Hypokalemia   . Housing problems 11/23/2016  . Chronic atrial fibrillation (HCC) 11/01/2016  . Hypotension 10/31/2016  . Special screening for malignant neoplasms, colon 08/12/2016  . Heme positive stool 08/09/2016  . Intertrigo 08/09/2016  . Chronic anticoagulation 04/26/2016  . Chronic venous insufficiency   . Preventative health care 10/05/2014  . Breast mass, left 10/05/2014  . Osteopenia 09/15/2014  . Hypertension 01/04/2014  . Chronic systolic dysfunction of left ventricle 05/05/2013  . At high risk for falls 02/11/2013  . Vitamin D deficiency 07/15/2012  . Sinoatrial node dysfunction (HCC) 05/08/2011  . Pacemaker 08/10/2009  . Allergic rhinitis 05/31/2008  . Morbid obesity (HCC) 02/12/2007  . GASTROESOPHAGEAL REFLUX, NO ESOPHAGITIS 02/12/2007  . Female stress incontinence 02/12/2007  . Seizure disorder (HCC) 02/12/2007  . OSA (obstructive sleep apnea) 02/12/2007    Current Outpatient Medications:  .  amiodarone (PACERONE) 200 MG tablet, Take 0.5 tablets (100 mg total) by mouth daily., Disp: 45 tablet, Rfl: 3 .  cholecalciferol (VITAMIN D) 25 MCG (1000 UNIT) tablet, TAKE 1 TABLET BY MOUTH ONCE DAILY., Disp: 28 tablet, Rfl: 11 .   digoxin (LANOXIN) 0.125 MG tablet, TAKE 1 TABLET BY MOUTH ONCE DAILY., Disp: 28 tablet, Rfl: 4 .  edoxaban (SAVAYSA) 60 MG TABS tablet, Take 60 mg by mouth daily., Disp: 30 tablet, Rfl: 11 .  fluticasone (FLONASE) 50 MCG/ACT nasal spray, Place 2 sprays into both nostrils daily as needed. For congestion., Disp: 16 g, Rfl: 1 .  losartan (COZAAR) 25 MG tablet, Take 0.5 tablets (12.5 mg total) by mouth daily., Disp: 15 tablet, Rfl: 11 .  omeprazole (PRILOSEC) 20 MG capsule, TAKE (1) CAPSULE BY MOUTH ONCE DAILY., Disp: 28 capsule, Rfl: 5 .  PHENobarbital (LUMINAL) 64.8 MG tablet, TAKE 1 TABLET BY MOUTH TWICE DAILY, Disp: 56 tablet, Rfl: 5 .  potassium chloride (KLOR-CON) 10 MEQ tablet, TAKE 4 TABLETS BY MOUTH THREE TIMES DAILY (MORNING, NOON, AND NIGHT), Disp: 336 tablet, Rfl: 11 .  spironolactone (ALDACTONE) 25 MG tablet, TAKE 1 TABLET BY MOUTH ONCE DAILY., Disp: 28 tablet, Rfl: 4 .  torsemide (DEMADEX) 20 MG tablet, Take 4 tablets (80 mg total) by mouth 2 (two) times daily., Disp: 240 tablet, Rfl: 0 .  triamcinolone cream (KENALOG) 0.5 %, Apply topically 3 (three) times daily. (Patient not taking: Reported on 09/22/2020), Disp: 30 g, Rfl: 0 Allergies  Allergen Reactions  . Aspirin Hives and Rash  . Penicillins Rash and Other (See Comments)    Has patient had a PCN reaction causing immediate rash, facial/tongue/throat swelling, SOB or lightheadedness with hypotension: Yes Has patient had a PCN reaction causing severe rash involving mucus membranes or skin necrosis: No Has patient had a PCN reaction that required hospitalization No Has patient had a  PCN reaction occurring within the last 10 years: Yes If all of the above answers are "NO", then may proceed with Cephalosporin use.  Patient has tolerated cephalosporins in 2017.  . Tape Other (See Comments)    NO PAPER TAPE-MUST BE MEDICAL TAPE - unknown reaction NO PAPER TAPE-MUST BE MEDICAL TAPE - unknown reaction  . Wool Alcohol [Lanolin] Hives  .  Amoxicillin Rash    Patient has tolerated cephalosporins  . Ampicillin Itching and Rash    Patient has tolerated cephalosporins      Social History   Socioeconomic History  . Marital status: Married    Spouse name: [name]  . Number of children: 4  . Years of education: 67  . Highest education level: Not on file  Occupational History  . Occupation: disabled  Tobacco Use  . Smoking status: Former Smoker    Packs/day: 1.00    Years: 7.00    Pack years: 7.00    Types: Cigarettes    Quit date: 03/16/1969    Years since quitting: 51.5  . Smokeless tobacco: Never Used  Vaping Use  . Vaping Use: Never used  Substance and Sexual Activity  . Alcohol use: No  . Drug use: No  . Sexual activity: Not Currently    Birth control/protection: Post-menopausal  Other Topics Concern  . Not on file  Social History Narrative   Lives with husband [name] lives with them as do his 2 children   Daughter, [name], and her 3 children live in [place] -  [name] is incarcerated   Nephew, Nelle Sayed lives with them   Daughter, [name], in [facility] [name] [address]   Moved June 2013 to a better home on [facility].      Lives with husband, son, grandson, granddaughter, nephew.   Social Determinants of Health   Financial Resource Strain: Medium Risk  . Difficulty of Paying Living Expenses: Somewhat hard  Food Insecurity: No Food Insecurity  . Worried About Programme researcher, broadcasting/film/video in the Last Year: Never true  . Ran Out of Food in the Last Year: Never true  Transportation Needs: No Transportation Needs  . Lack of Transportation (Medical): No  . Lack of Transportation (Non-Medical): No  Physical Activity:   . Days of Exercise per Week: Not on file  . Minutes of Exercise per Session: Not on file  Stress:   . Feeling of Stress : Not on file  Social Connections:   . Frequency of Communication with Friends and Family: Not on file  .  Frequency of Social Gatherings with Friends and Family: Not on file  . Attends Religious Services: Not on file  . Active Member of Clubs or Organizations: Not on file  . Attends Banker Meetings: Not on file  . Marital Status: Not on file  Intimate Partner Violence:   . Fear of Current or Ex-Partner: Not on file  . Emotionally Abused: Not on file  . Physically Abused: Not on file  . Sexually Abused: Not on file    Physical Exam Cardiovascular:     Rate and Rhythm: Regular rhythm. Bradycardia present.     Pulses: Normal pulses.  Pulmonary:     Effort: Pulmonary effort is normal.     Breath sounds: Normal breath sounds.  Musculoskeletal:        General: Normal range of motion.     Right lower leg: Edema present.  Left lower leg: Edema present.  Skin:    General: Skin is warm and dry.     Capillary Refill: Capillary refill takes less than 2 seconds.  Neurological:     Mental Status: She is alert and oriented to person, place, and time.  Psychiatric:        Mood and Affect: Mood normal.         Future Appointments  Date Time Provider Department Center  09/27/2020  3:00 PM [facility] PA/NP [facility] None  12/12/2020  2:45 PM [place], Lise Auer, DPM [facility]    BP (!) 102/50 (BP Location: Right Arm, Patient Position: Sitting, Cuff Size: Large)   Pulse (!) 55   Resp 16   Wt 184 lb 6.4 oz (83.6 kg)   SpO2 98%   BMI 39.90 kg/m   Weight yesterday- did not weigh  Last visit weight- 183 lb  Ms Bing was seen at home today and reported feeling well. She denied chest pain, SOB, headache, dizziness, orthopnea, fever or cough over the past two weeks. She has missed her appointment earlier this week secondary to a traumatic hand injury so I was able to get her rescheduled for next Wednesday at 15:00. She stated she has not been taking her bedtime medications because they were packaged incorrectly. Upon looking ar the pill packs she presented me I noted  they were several months old and still completely full. I pointed this out and she found the most recent packs which she had been avoiding the afternoon and evening spots due to the incorrect packing. By this she meant the torsemide was in the bed time bubble instead of the afternoon bubble. I removed the medications from the package and reorganized them into a pillbox. I contacted the pharmacy and they advised they would package it as requested next time they deliver it. I will make the change and use a pillbox until this is corrected. I will follow up next week.   Jacqualine Code, EMT 09/22/20  ACTION: Home visit completed Next visit planned for 1 week
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
## 2020-09-26 ENCOUNTER — Other Ambulatory Visit (HOSPITAL_COMMUNITY): Payer: Self-pay | Admitting: Internal Medicine

## 2020-09-27 ENCOUNTER — Ambulatory Visit (HOSPITAL_COMMUNITY)
Admission: RE | Admit: 2020-09-27 | Discharge: 2020-09-27 | Disposition: A | Discharge status: Home / Self Care | Payer: Medicare HMO | Source: Ambulatory Visit | Attending: Cardiology | Admitting: Cardiology

## 2020-09-27 ENCOUNTER — Encounter (HOSPITAL_COMMUNITY): Payer: Self-pay

## 2020-09-27 ENCOUNTER — Other Ambulatory Visit: Payer: Self-pay

## 2020-09-27 VITALS — BP 110/70 | HR 62 | Wt 181.0 lb

## 2020-09-27 DIAGNOSIS — Z6839 Body mass index (BMI) 39.0-39.9, adult: Secondary | ICD-10-CM | POA: Insufficient documentation

## 2020-09-27 DIAGNOSIS — I4891 Unspecified atrial fibrillation: Secondary | ICD-10-CM | POA: Insufficient documentation

## 2020-09-27 DIAGNOSIS — I5022 Chronic systolic (congestive) heart failure: Secondary | ICD-10-CM | POA: Diagnosis not present

## 2020-09-27 DIAGNOSIS — I110 Hypertensive heart disease with heart failure: Secondary | ICD-10-CM | POA: Insufficient documentation

## 2020-09-27 DIAGNOSIS — Z950 Presence of cardiac pacemaker: Secondary | ICD-10-CM | POA: Insufficient documentation

## 2020-09-27 DIAGNOSIS — I428 Other cardiomyopathies: Secondary | ICD-10-CM | POA: Diagnosis not present

## 2020-09-27 DIAGNOSIS — K219 Gastro-esophageal reflux disease without esophagitis: Secondary | ICD-10-CM | POA: Diagnosis not present

## 2020-09-27 DIAGNOSIS — Z79899 Other long term (current) drug therapy: Secondary | ICD-10-CM | POA: Insufficient documentation

## 2020-09-27 DIAGNOSIS — I493 Ventricular premature depolarization: Secondary | ICD-10-CM | POA: Insufficient documentation

## 2020-09-27 LAB — COMPREHENSIVE METABOLIC PANEL
ALT: 14 U/L (ref 0–44)
AST: 24 U/L (ref 15–41)
Albumin: 3.3 g/dL — ABNORMAL LOW (ref 3.5–5.0)
Alkaline Phosphatase: 123 U/L (ref 38–126)
Anion gap: 9 (ref 5–15)
BUN: 24 mg/dL — ABNORMAL HIGH (ref 8–23)
CO2: 27 mmol/L (ref 22–32)
Calcium: 9.5 mg/dL (ref 8.9–10.3)
Chloride: 101 mmol/L (ref 98–111)
Creatinine, Ser: 0.95 mg/dL (ref 0.44–1.00)
GFR, Estimated: >60 mL/min (ref >60)
Glucose, Bld: 92 mg/dL (ref 70–99)
Potassium: 4 mmol/L (ref 3.5–5.1)
Sodium: 137 mmol/L (ref 135–145)
Total Bilirubin: 1 mg/dL (ref 0.3–1.2)
Total Protein: 7.5 g/dL (ref 6.5–8.1)

## 2020-09-27 LAB — CBC
HCT: 39.2 % (ref 36.0–46.0)
Hemoglobin: 12.5 g/dL (ref 12.0–15.0)
MCH: 30.3 pg (ref 26.0–34.0)
MCHC: 31.9 g/dL (ref 30.0–36.0)
MCV: 94.9 fL (ref 80.0–100.0)
RBC: 4.13 MIL/uL (ref 3.87–5.11)
RDW: 17.2 % — ABNORMAL HIGH (ref 11.5–15.5)
WBC: 4.7 10*3/uL (ref 4.0–10.5)
nRBC: 0 % (ref 0.0–0.2)

## 2020-09-27 LAB — DIGOXIN LEVEL: Digoxin Level: 1.9 ng/mL (ref 1.0–2.0)

## 2020-09-27 LAB — TSH: TSH: 1.175 u[IU]/mL (ref 0.350–4.500)

## 2020-09-27 MED ORDER — TORSEMIDE 20 MG PO TABS: ORAL_TABLET | ORAL | 3 refills | Status: DC

## 2020-09-27 NOTE — Progress Notes (Signed)
ReDS Vest / Clip - 09/27/20 1500      ReDS Vest / Clip   Station Marker B    Ruler Value 26    ReDS Value Range Moderate volume overload    ReDS Actual Value 37    Anatomical Comments sitting

## 2020-09-27 NOTE — Progress Notes (Signed)
Heart Failure Date:  09/27/2020   ID:  Misty Gray, DOB 1951/12/07, MRN [id number]  Location: Home  Provider location: [facility] Type of Visit: Established patient  PCP:  Latrelle Dodrill, MD- Internal Medicine   Cardiologist:  No primary care provider on file. Primary HF: [name] EP: Dr Johney Frame   Chief Complaint: F/u for Chronic Systolic Heart Failure    History of Present Illness: Misty Gray is a 69 y/o woman with history of morbid obesity, OSA, chronic atrial fibrillation with tachy-brady syndrome s/p PPM, frequent PVCs, systolic HF due to NICM.  Admitted for ADHF in 10/17. Cath with normal coronary arteries and elevated filling pressures. Echo 10/17 EF 30-35% (previously 35-40%). Felt to have possible PVC or RV pacing cardiomyopathy. (She has 81% RV pacing and 15-20 PVCs per minute). Started on po amio with suppression of PVCs. Diuresed 41 pounds. Discharge weight 197.   Admitted 12/14 through 12/06/2016 with marked volume overload. Discharge weight was 195 pounds.Transitioned to torsemide 40 mg in am and 20 mg in pm.   Echo 07/2017 LVEF 25-30%, Trivial AI, Mod MR, Severe LAE, Mild RV dilation, Mild RAE, PA peak pressure 31 mm Hg.  Echo 1/20 EF 25-30% (no improvement with PVC suppression)  Admitted 07/2020 with A/C systolic heart failure. Volume overloaded. Diuresed with IV lasix and transitioned to torsemide 60 mg twice a day. RV pacing 28% of the time by device interrogation.  Very limited GDMT due to inability to tolerate HF meds.  Excellent diuresis w/ IV Lasix, overall down 21 lb. Decreased backup pacing rate to 50 bpm from 70 bpm. She is unable to perform remote device transmissions because of her housing situation. Discharge date 07/18/2020.  Today she returns for routien HF follow up. Here w/ her son. She is mildly fluid overloaded on exam w/ 1+ bilateral LEE. ReDs clip elevated at 37%. She is not very active. Mainly sedentary. Denies  resting dyspnea. No orthopnea/ PND. Reports full med compliance. Tries to refrain from high sodium foods. BP is well controlled at 110/70.    Studies: ECHO 06/2020  EF 25-30% RV moderately reduced and enlarged.   R/LHC 10/03/16 Ao = 94/58 (72) LV = 104/20 RA = 21 RV = 65/4/19 PA = 65/21 (38) PCW = 23 Fick cardiac output/index = 6.1/2.9 PVR = 2.5 WU FA sat = 97%  PA sat = 69%, 70% Assessment: 1. Normal coronary arteries 2. NICM with EF 30-35% by echo 3. Persistently elevated biventricular pressures   Past Medical History:  Diagnosis Date  . Abnormal CT of the head 12/16/1984   R parietal atrophy  . Allergic rhinitis, cause unspecified   . Altered mental status 11/28/2016  . Anemia   . Arthritis    "hands and knees" (09/06/2015)  . Atrial fibrillation (HCC)   . Cellulitis 09/07/2015  . Cellulitis and abscess of leg 09/2016   bilateral  . CHF (congestive heart failure) (HCC)   . Diaphragmatic hernia without mention of obstruction or gangrene   . Dyspnea   . Exertional dyspnea 06/22/12  . Family history of adverse reaction to anesthesia    "daughter fights w/them; just can't relax during OR; stay awake during the OR"  . Fatigue 06/22/2012  . Female stress incontinence   . GERD (gastroesophageal reflux disease)   . Grand mal seizure (HCC)   . H/O hiatal hernia    two removed  . Heart murmur   . High cholesterol   .  History of blood transfusion 1956   "related to nose bleed"  . Hypertension   . Influenza A 01/11/2014  . Lichenification and lichen simplex chronicus   . Migraine    "when I was a teenager"  . Morbid obesity (HCC)   . Nonischemic cardiomyopathy (HCC)    EF has normalized-repeat Pending   . On home oxygen therapy    "2L when I'm asleep in bed" (09/06/2015)  . OSA on CPAP   . Pacemaker  st judes    Patient states "this is my third pacemaker"  . Pneumonia 2004; 2011; 11/2011  . Seizures (HCC) since 1981   "can hear you talking but sounds like you are in  big tunnel; have them often if not taking RX; last one was 06/17/12" (09/06/2015)  . Shortness of breath 10/24/2010   Qualifier: Diagnosis of  By: [place], Bonnie    . Sick sinus syndrome (HCC)    WITH PRIOR DDD PACEMAKER IMPLANTATION  . Somnolence   . Stroke [facility]) 1988   "mouth drawed real bad on my left side; found out it was a seizure"  . Syncope and collapse 06/22/12   "hit forehead and left knee"; denies loss of consciousness  . Unspecified venous (peripheral) insufficiency    Past Surgical History:  Procedure Laterality Date  . APPENDECTOMY    . CARDIAC CATHETERIZATION N/A 10/03/2016   Procedure: Right/Left Heart Cath and Coronary Angiography;  Surgeon: Dolores Patty, MD;  Location: [facility] INVASIVE CV LAB;  Service: Cardiovascular;  Laterality: N/A;  . HERNIA REPAIR  ? 1988; 04/15/1998  . INSERT / REPLACE / REMOVE PACEMAKER  12/16/1990   DDD EF 30%;  Marland Kitchen INSERT / REPLACE / REMOVE PACEMAKER  07/25/2003   replaced by BB, leads are both IS1 and from 1992  . INSERT / REPLACE / REMOVE PACEMAKER  05/21/2011   JA; generator change  . LEG SKIN LESION  BIOPSY / EXCISION  05/16/2001   Lichen Planus  . VENTRAL HERNIA REPAIR  1989; 04/15/1998     Current Outpatient Medications  Medication Sig Dispense Refill  . amiodarone (PACERONE) 200 MG tablet Take 0.5 tablets (100 mg total) by mouth daily. 45 tablet 3  . cholecalciferol (VITAMIN D) 25 MCG (1000 UNIT) tablet TAKE 1 TABLET BY MOUTH ONCE DAILY. 28 tablet 11  . digoxin (LANOXIN) 0.125 MG tablet TAKE 1 TABLET BY MOUTH ONCE DAILY. 28 tablet 4  . edoxaban (SAVAYSA) 60 MG TABS tablet Take 60 mg by mouth daily. 30 tablet 11  . fluticasone (FLONASE) 50 MCG/ACT nasal spray Place 2 sprays into both nostrils daily as needed. For congestion. 16 g 1  . losartan (COZAAR) 25 MG tablet Take 0.5 tablets (12.5 mg total) by mouth daily. 15 tablet 11  . omeprazole (PRILOSEC) 20 MG capsule TAKE (1) CAPSULE BY MOUTH ONCE DAILY. 28 capsule 5  . PHENobarbital (LUMINAL) 64.8  MG tablet TAKE 1 TABLET BY MOUTH TWICE DAILY 56 tablet 5  . potassium chloride (KLOR-CON) 10 MEQ tablet TAKE 4 TABLETS BY MOUTH THREE TIMES DAILY (MORNING, NOON, AND NIGHT) 336 tablet 11  . spironolactone (ALDACTONE) 25 MG tablet TAKE 1 TABLET BY MOUTH ONCE DAILY. 28 tablet 4  . torsemide (DEMADEX) 20 MG tablet Take 3 tablets (60 mg total) by mouth 2 (two) times daily. 180 tablet 3   No current facility-administered medications for this encounter.    Allergies:   Aspirin, Penicillins, Tape, Wool alcohol [lanolin], Amoxicillin, and Ampicillin   Social History:  The patient  reports that she quit smoking about 51 years ago. Her smoking use included cigarettes. She has a 7.00 pack-year smoking history. She has never used smokeless tobacco. She reports that she does not drink alcohol and does not use drugs.   Family History:  The patient's family history includes Cancer in her mother and sister; Diabetes in her son; Heart disease in her father; Hypertension in her brother and sister; Rheum arthritis in her daughter; Sleep apnea in her son; Stroke in her father.   ROS:  Please see the history of present illness.   All other systems are personally reviewed and negative.  Vitals:   09/27/20 1514  BP: 110/70  Pulse: 62  SpO2: 96%   Wt Readings from Last 3 Encounters:  09/27/20 82.1 kg  09/22/20 83.6 kg  09/22/20 83 kg    PHYSICAL EXAM: ReDs clip 37%  General:  Elderly WF in wheel chair. No respiratory difficulty HEENT: normal Neck: supple. JVD ~ 8 cm. Carotids 2+ bilat; no bruits. No lymphadenopathy or thyromegaly appreciated. Cor: PMI nondisplaced. Irregularly irregular rhythm, regular rate. No rubs, gallops or murmurs. Lungs: clear Abdomen: soft, nontender, nondistended. No hepatosplenomegaly. No bruits or masses. Good bowel sounds. Extremities: no cyanosis, clubbing, rash, 1+ bilateral LEE Neuro: alert & oriented x 3, cranial nerves grossly intact. moves all 4 extremities w/o  difficulty. Affect pleasant.   Recent Labs: 07/12/2020: ALT 12 07/16/2020: Magnesium 2.0 08/07/2020: Hemoglobin 12.3; Platelets 106 09/05/2020: B Natriuretic Peptide 1,407.3; BUN 27; Creatinine, Ser 0.96; Potassium 4.3; Sodium 140  Personally reviewed   Wt Readings from Last 3 Encounters:  09/27/20 82.1 kg  09/22/20 83.6 kg  09/22/20 83 kg      ASSESSMENT AND PLAN:  1. Chronic systolic HF due to NICM. ? PVC induced vs RV pacing. Echo 12/06/2016 EF 10-15%. Cath 10/17 normal coronary arteries.  Echo 02/2017 with LVEF 15-20% despite PVC suppression. Echo 07/2017 EF 25-30%.  Trivial AI, Mod MR, Severe LAE, Mild RV dilation, Mild RAE, PA peak pressure 31 mm Hg. Echo 1/20 EF 25-30%. ECHO 06/2020 EF 25-30% RV moderately reduced.  - NYHA III, chronic. Volume status elevated. ReDs Clip 37% - Increase torsemide to 80 mg qam and 60 mg qpm - Continue spiro 25 mg daily   - Continue losartan 12.5 mg daily (did not tolerate higher dose previously due to orthostatic  hypotension)  - Continue diogxin  0.125 mg daily. Check dig level today.  - Would not use SGLT2i given high risk of genital yeast infection - intolerant to  blockers - check BMET today and again in 7 days - continue w/ paramedicine. Appreciate their assistance   2. Frequent PVCs - Suppressed. Continue Amio 100 mg daily.   - check TSH and HFTs today  - needs annual eye exams   3. Obesity  Body mass index is 39.17 kg/m.  4. Chronic AF with tachy-brady s/p pacemaker - Well rate controlled, 62 bpm on EKG - intolerant to  blockers - on low dose amio, 100 mg daily, for rate control and PVC suppression - check TSH and HFTs today. Needs annual eye exam   - on endoxaban for stroke prophylaxis.  Denies bleeding. Check CBC today  - PPM followed by Dr. Johney Frame   F/u in 4 weeks to reassess fluid status.   Signed, Robbie Lis, PA-C  09/27/2020 3:20 PM  [facility] [address] Heart and  Vascular [place] [address] [phone] (office) [phone] (fax)

## 2020-09-27 NOTE — Patient Instructions (Signed)
INCREASE Torsemide to 80mg (4 tabs) in the AM and 60 mg (3 tabs) in the PM  Labs today We will only contact you if something comes back abnormal or we need to make some changes. Otherwise no news is good news!  Labs needed in one week   Your physician recommends that you schedule a follow-up appointment in: 4-6 weeks  If you have any questions or concerns before your next appointment please send a message through [place] or call our office at [phone].    TO LEAVE A MESSAGE FOR THE NURSE SELECT OPTION 2, PLEASE LEAVE A MESSAGE INCLUDING: . YOUR NAME . DATE OF BIRTH . CALL BACK NUMBER . REASON FOR CALL**this is important as we prioritize the call backs  YOU WILL RECEIVE A CALL BACK THE SAME DAY AS LONG AS YOU CALL BEFORE 4:00 PM

## 2020-10-03 ENCOUNTER — Other Ambulatory Visit: Payer: Self-pay

## 2020-10-03 ENCOUNTER — Emergency Department (HOSPITAL_COMMUNITY)
Admission: EM | Admit: 2020-10-03 | Discharge: 2020-10-03 | Disposition: A | Discharge status: Home / Self Care | Payer: Medicare HMO | Attending: Emergency Medicine | Admitting: Emergency Medicine

## 2020-10-03 ENCOUNTER — Emergency Department (HOSPITAL_COMMUNITY): Payer: Medicare HMO

## 2020-10-03 DIAGNOSIS — Z950 Presence of cardiac pacemaker: Secondary | ICD-10-CM | POA: Insufficient documentation

## 2020-10-03 DIAGNOSIS — Z955 Presence of coronary angioplasty implant and graft: Secondary | ICD-10-CM | POA: Insufficient documentation

## 2020-10-03 DIAGNOSIS — I509 Heart failure, unspecified: Secondary | ICD-10-CM | POA: Insufficient documentation

## 2020-10-03 DIAGNOSIS — I110 Hypertensive heart disease with heart failure: Secondary | ICD-10-CM | POA: Diagnosis not present

## 2020-10-03 DIAGNOSIS — S60222A Contusion of left hand, initial encounter: Secondary | ICD-10-CM | POA: Insufficient documentation

## 2020-10-03 DIAGNOSIS — Y92010 Kitchen of single-family (private) house as the place of occurrence of the external cause: Secondary | ICD-10-CM | POA: Diagnosis not present

## 2020-10-03 DIAGNOSIS — Z7901 Long term (current) use of anticoagulants: Secondary | ICD-10-CM | POA: Diagnosis not present

## 2020-10-03 DIAGNOSIS — Y93G3 Activity, cooking and baking: Secondary | ICD-10-CM | POA: Diagnosis not present

## 2020-10-03 DIAGNOSIS — Z87891 Personal history of nicotine dependence: Secondary | ICD-10-CM | POA: Insufficient documentation

## 2020-10-03 DIAGNOSIS — I872 Venous insufficiency (chronic) (peripheral): Secondary | ICD-10-CM | POA: Insufficient documentation

## 2020-10-03 DIAGNOSIS — S60222D Contusion of left hand, subsequent encounter: Secondary | ICD-10-CM

## 2020-10-03 DIAGNOSIS — M79642 Pain in left hand: Secondary | ICD-10-CM | POA: Diagnosis not present

## 2020-10-03 DIAGNOSIS — Z8673 Personal history of transient ischemic attack (TIA), and cerebral infarction without residual deficits: Secondary | ICD-10-CM | POA: Insufficient documentation

## 2020-10-03 DIAGNOSIS — Z79899 Other long term (current) drug therapy: Secondary | ICD-10-CM | POA: Diagnosis not present

## 2020-10-03 DIAGNOSIS — S6992XA Unspecified injury of left wrist, hand and finger(s), initial encounter: Secondary | ICD-10-CM | POA: Diagnosis present

## 2020-10-03 DIAGNOSIS — I4811 Longstanding persistent atrial fibrillation: Secondary | ICD-10-CM | POA: Insufficient documentation

## 2020-10-03 DIAGNOSIS — W290XXA Contact with powered kitchen appliance, initial encounter: Secondary | ICD-10-CM | POA: Insufficient documentation

## 2020-10-03 NOTE — ED Triage Notes (Signed)
Pt arrives to ED by POV from home for 1 week of left hand pain following incident in which she was injured "by an egg beater while I was baking a cake." Significant bruising noted to extremity and up onto forearm. Diminished radial pulse and limited movement on affected side. Hx RA.

## 2020-10-03 NOTE — ED Provider Notes (Signed)
[facility] EMERGENCY DEPARTMENT Provider Note  CSN: [id number] Arrival date & time: 10/03/20 0750    History Chief Complaint  Patient presents with  . Hand Pain    HPI  Misty Gray is a 69 y.o. female with history of afib on Savaysa for anticoagulation reports she injured her L hand when it got caught in her hand mixer earlier this month. She reports it was on 10/12, but per previous visit at PCP it happened 10/8. She had an xray done that day which was concerning for hairline fracture at the base of the thumb, although she wasn't tender at that spot. She was recommended to use ACE, elevation and ice/heat for comfort. She reports it has continued to bother her since the injury. Has not been back to PCP.    Past Medical History:  Diagnosis Date  . Abnormal CT of the head 12/16/1984   R parietal atrophy  . Allergic rhinitis, cause unspecified   . Altered mental status 11/28/2016  . Anemia   . Arthritis    "hands and knees" (09/06/2015)  . Atrial fibrillation (HCC)   . Cellulitis 09/07/2015  . Cellulitis and abscess of leg 09/2016   bilateral  . CHF (congestive heart failure) (HCC)   . Diaphragmatic hernia without mention of obstruction or gangrene   . Dyspnea   . Exertional dyspnea 06/22/12  . Family history of adverse reaction to anesthesia    "daughter fights w/them; just can't relax during OR; stay awake during the OR"  . Fatigue 06/22/2012  . Female stress incontinence   . GERD (gastroesophageal reflux disease)   . Grand mal seizure (HCC)   . H/O hiatal hernia    two removed  . Heart murmur   . High cholesterol   . History of blood transfusion 1956   "related to nose bleed"  . Hypertension   . Influenza A 01/11/2014  . Lichenification and lichen simplex chronicus   . Migraine    "when I was a teenager"  . Morbid obesity (HCC)   . Nonischemic cardiomyopathy (HCC)    EF has normalized-repeat Pending   . On home oxygen therapy    "2L when I'm asleep in bed"  (09/06/2015)  . OSA on CPAP   . Pacemaker  st judes    Patient states "this is my third pacemaker"  . Pneumonia 2004; 2011; 11/2011  . Seizures (HCC) since 1981   "can hear you talking but sounds like you are in big tunnel; have them often if not taking RX; last one was 06/17/12" (09/06/2015)  . Shortness of breath 10/24/2010   Qualifier: Diagnosis of  By: [place], Bonnie    . Sick sinus syndrome (HCC)    WITH PRIOR DDD PACEMAKER IMPLANTATION  . Somnolence   . Stroke [facility]) 1988   "mouth drawed real bad on my left side; found out it was a seizure"  . Syncope and collapse 06/22/12   "hit forehead and left knee"; denies loss of consciousness  . Unspecified venous (peripheral) insufficiency     Past Surgical History:  Procedure Laterality Date  . APPENDECTOMY    . CARDIAC CATHETERIZATION N/A 10/03/2016   Procedure: Right/Left Heart Cath and Coronary Angiography;  Surgeon: Dolores Patty, MD;  Location: [facility] INVASIVE CV LAB;  Service: Cardiovascular;  Laterality: N/A;  . HERNIA REPAIR  ? 1988; 04/15/1998  . INSERT / REPLACE / REMOVE PACEMAKER  12/16/1990   DDD EF 30%;  Marland Kitchen INSERT / REPLACE / REMOVE PACEMAKER  07/25/2003   replaced by BB, leads are both IS1 and from 1992  . INSERT / REPLACE / REMOVE PACEMAKER  05/21/2011   JA; generator change  . LEG SKIN LESION  BIOPSY / EXCISION  05/16/2001   Lichen Planus  . VENTRAL HERNIA REPAIR  1989; 04/15/1998    Family History  Problem Relation Age of Onset  . Stroke Father        died from CAD?  Marland Kitchen Heart disease Father   . Cancer Mother   . Diabetes Son        Type 1  . Sleep apnea Son   . Cancer Sister        cervical  . Hypertension Sister   . Hypertension Brother   . Rheum arthritis Daughter        Also PGM, PGGM    Social History   Tobacco Use  . Smoking status: Former Smoker    Packs/day: 1.00    Years: 7.00    Pack years: 7.00    Types: Cigarettes    Quit date: 03/16/1969    Years since quitting: 51.5  . Smokeless tobacco: Never Used    Vaping Use  . Vaping Use: Never used  Substance Use Topics  . Alcohol use: No  . Drug use: No     Home Medications Prior to Admission medications   Medication Sig Start Date End Date Taking? Authorizing Provider  amiodarone (PACERONE) 200 MG tablet Take 0.5 tablets (100 mg total) by mouth daily. 07/18/20   Dollene Cleveland, DO  cholecalciferol (VITAMIN D) 25 MCG (1000 UNIT) tablet TAKE 1 TABLET BY MOUTH ONCE DAILY. 01/26/20   Latrelle Dodrill, MD  digoxin (LANOXIN) 0.125 MG tablet TAKE 1 TABLET BY MOUTH ONCE DAILY. 09/08/20   [name], [name], MD  edoxaban (SAVAYSA) 60 MG TABS tablet Take 60 mg by mouth daily. 01/31/20   [name], [name], MD  fluticasone (FLONASE) 50 MCG/ACT nasal spray Place 2 sprays into both nostrils daily as needed. For congestion. 07/28/17   Latrelle Dodrill, MD  losartan (COZAAR) 25 MG tablet Take 0.5 tablets (12.5 mg total) by mouth daily. 09/05/20   [name], [name], MD  omeprazole (PRILOSEC) 20 MG capsule TAKE (1) CAPSULE BY MOUTH ONCE DAILY. 07/26/20   Latrelle Dodrill, MD  PHENobarbital (LUMINAL) 64.8 MG tablet TAKE 1 TABLET BY MOUTH TWICE DAILY 07/26/20   Latrelle Dodrill, MD  potassium chloride (KLOR-CON) 10 MEQ tablet TAKE 4 TABLETS BY MOUTH THREE TIMES DAILY (MORNING, NOON, AND NIGHT) 08/04/20   Laurey Morale, MD  spironolactone (ALDACTONE) 25 MG tablet TAKE 1 TABLET BY MOUTH ONCE DAILY. 09/08/20   [name], [name], MD  torsemide (DEMADEX) 20 MG tablet Take 4 tablets (80 mg total) by mouth in the morning AND 3 tablets (60 mg total) every evening. 09/27/20   Allayne Butcher, PA-C     Allergies    Aspirin, Penicillins, Tape, Wool alcohol [lanolin], Amoxicillin, and Ampicillin   Review of Systems   Review of Systems A comprehensive review of systems was completed and negative except as noted in HPI.    Physical Exam BP 104/71   Pulse 60   Temp 97.8 F (36.6 C) (Oral)   Resp (!) 25   Ht 4\' 9"  (1.448 m)   Wt 83.5 kg    SpO2 99%   BMI 39.82 kg/m   Physical Exam Vitals and nursing note reviewed.  HENT:     Head: Normocephalic.  Nose: Nose normal.  Eyes:     Extraocular Movements: Extraocular movements intact.  Pulmonary:     Effort: Pulmonary effort is normal.  Musculoskeletal:        General: Normal range of motion.     Cervical back: Neck supple.     Comments: Marked ecchymosis and mild dorsal swelling to L hand, tender to palpation. No deformity, good pulses.   Skin:    Findings: No rash (on exposed skin).  Neurological:     Mental Status: She is alert and oriented to person, place, and time.  Psychiatric:        Mood and Affect: Mood normal.        ED Results / Procedures / Treatments   Labs (all labs ordered are listed, but only abnormal results are displayed) Labs Reviewed - No data to display  EKG None  Radiology DG Hand Complete Left  Result Date: 10/03/2020 CLINICAL DATA:  Acute left hand pain after injury last week. EXAM: LEFT HAND - COMPLETE 3+ VIEW COMPARISON:  September 22, 2020. FINDINGS: There is no evidence of fracture or dislocation. Mild narrowing of radiocarpal joint is noted. Moderate degenerative changes seen involving the first carpometacarpal joint. Dorsal soft tissue swelling is noted which may be traumatic in etiology. IMPRESSION: No fracture or dislocation is noted. Dorsal soft tissue swelling is noted which may be traumatic in etiology. Electronically Signed   By: Lupita Raider M.D.   On: 10/03/2020 09:30    Procedures Procedures  Medications Ordered in the ED Medications - No data to display   MDM Rules/Calculators/A&P MDM Given possible fracture on previous xray, will send for repeat.  ED Course  I have reviewed the triage vital signs and the nursing notes.  Pertinent labs & imaging results that were available during my care of the patient were reviewed by me and considered in my medical decision making (see chart for details).  Clinical Course  as of Oct 03 945  Tue Oct 03, 2020  8185 Xray is neg for fracture. Patient placed back in ACE wrap, recommend continued compression and elevation. PCP Follow up. Given her blood thinner use, it may yet be some time before she sees significant improvement.    [CS]    Clinical Course User Index [CS] Pollyann Savoy, MD    Final Clinical Impression(s) / ED Diagnoses Final diagnoses:  Contusion of left hand, subsequent encounter    Rx / DC Orders ED Discharge Orders    None       Pollyann Savoy, MD 10/03/20 [phone]

## 2020-10-05 ENCOUNTER — Telehealth (HOSPITAL_COMMUNITY): Payer: Self-pay | Admitting: Cardiology

## 2020-10-05 MED ORDER — DIGOXIN 125 MCG PO TABS: 0.0625 mg | ORAL_TABLET | Freq: Every day | ORAL | 4 refills | Status: DC

## 2020-10-05 NOTE — Telephone Encounter (Signed)
-----   Message from Allayne Butcher, [place] sent at 09/28/2020  5:25 PM EDT ----- Dig level elevated. I attempted phone call and left message to hold for now. Please f/u in AM to make sure she got message. Hold x 3 days, then reduce to 0.0625 mg daily. Repeat dig level in 1 week

## 2020-10-05 NOTE — Telephone Encounter (Signed)
PT AWARE VIA ZACK, MEDS UPDATED

## 2020-10-06 ENCOUNTER — Other Ambulatory Visit (HOSPITAL_COMMUNITY): Payer: Medicare HMO

## 2020-10-06 ENCOUNTER — Telehealth (HOSPITAL_COMMUNITY): Payer: Self-pay

## 2020-10-06 NOTE — Telephone Encounter (Signed)
I called Misty Gray to see if she was available for a visit today. She did not answer so I left a message requesting she call me back when she is able.   Jacqualine Code, EMT 10/06/20

## 2020-10-10 ENCOUNTER — Telehealth (HOSPITAL_COMMUNITY): Payer: Self-pay

## 2020-10-10 NOTE — Telephone Encounter (Signed)
I called Misty Gray to see if she was available for me to come by and see her. She did not answer so I left a message requesting she call me back.   Jacqualine Code, EMT 10/10/20

## 2020-10-10 NOTE — Telephone Encounter (Signed)
I called Ms Ashlock again to see if she was available for a visit. She did not answer so I left another message requesting she call me back as soon as possible. If I do not hear back by tomorrow, I will go by her house for a welfare check.   Jacqualine Code, EMT 10/10/20

## 2020-10-12 ENCOUNTER — Other Ambulatory Visit (HOSPITAL_COMMUNITY): Payer: Self-pay

## 2020-10-12 NOTE — Progress Notes (Signed)
Paramedicine Encounter    Patient ID: Misty Gray, female    DOB: [birth year], 69 y.o.   MRN: [id number]   Patient Care Team: Latrelle Dodrill, MD as PCP - General (Family Medicine) [name], MD (Cardiology)  Patient Active Problem List   Diagnosis Date Noted  . Hand injury, left, initial encounter 09/22/2020  . CHF exacerbation (HCC) 07/12/2020  . Hospital discharge follow-up 11/17/2019  . Onychomycosis 11/17/2019  . Sepsis due to pneumonia (HCC) 11/08/2019  . Abdominal wall hernia 11/05/2017  . Anticoagulated 11/05/2017  . Rash and nonspecific skin eruption 09/15/2017  . Ventral hernia without obstruction or gangrene 08/15/2017  . Skin irritation 07/23/2017  . Chronic venous stasis dermatitis 12/26/2016  . Urinary tract infection without hematuria   . Frequent PVCs   . Hypokalemia   . Housing problems 11/23/2016  . Chronic atrial fibrillation (HCC) 11/01/2016  . Hypotension 10/31/2016  . Special screening for malignant neoplasms, colon 08/12/2016  . Heme positive stool 08/09/2016  . Intertrigo 08/09/2016  . Chronic anticoagulation 04/26/2016  . Chronic venous insufficiency   . Preventative health care 10/05/2014  . Breast mass, left 10/05/2014  . Osteopenia 09/15/2014  . Hypertension 01/04/2014  . Chronic systolic dysfunction of left ventricle 05/05/2013  . At high risk for falls 02/11/2013  . Vitamin D deficiency 07/15/2012  . Sinoatrial node dysfunction (HCC) 05/08/2011  . Pacemaker 08/10/2009  . Allergic rhinitis 05/31/2008  . Morbid obesity (HCC) 02/12/2007  . GASTROESOPHAGEAL REFLUX, NO ESOPHAGITIS 02/12/2007  . Female stress incontinence 02/12/2007  . Seizure disorder (HCC) 02/12/2007  . OSA (obstructive sleep apnea) 02/12/2007    Current Outpatient Medications:  .  amiodarone (PACERONE) 200 MG tablet, Take 0.5 tablets (100 mg total) by mouth daily., Disp: 45 tablet, Rfl: 3 .  cholecalciferol (VITAMIN D) 25 MCG (1000 UNIT) tablet, TAKE 1 TABLET BY  MOUTH ONCE DAILY., Disp: 28 tablet, Rfl: 11 .  digoxin (LANOXIN) 0.125 MG tablet, Take 0.5 tablets (0.0625 mg total) by mouth daily., Disp: 15 tablet, Rfl: 4 .  edoxaban (SAVAYSA) 60 MG TABS tablet, Take 60 mg by mouth daily., Disp: 30 tablet, Rfl: 11 .  fluticasone (FLONASE) 50 MCG/ACT nasal spray, Place 2 sprays into both nostrils daily as needed. For congestion., Disp: 16 g, Rfl: 1 .  losartan (COZAAR) 25 MG tablet, Take 0.5 tablets (12.5 mg total) by mouth daily., Disp: 15 tablet, Rfl: 11 .  omeprazole (PRILOSEC) 20 MG capsule, TAKE (1) CAPSULE BY MOUTH ONCE DAILY., Disp: 28 capsule, Rfl: 5 .  PHENobarbital (LUMINAL) 64.8 MG tablet, TAKE 1 TABLET BY MOUTH TWICE DAILY, Disp: 56 tablet, Rfl: 5 .  potassium chloride (KLOR-CON) 10 MEQ tablet, TAKE 4 TABLETS BY MOUTH THREE TIMES DAILY (MORNING, NOON, AND NIGHT), Disp: 336 tablet, Rfl: 11 .  spironolactone (ALDACTONE) 25 MG tablet, TAKE 1 TABLET BY MOUTH ONCE DAILY., Disp: 28 tablet, Rfl: 4 .  torsemide (DEMADEX) 20 MG tablet, Take 4 tablets (80 mg total) by mouth in the morning AND 3 tablets (60 mg total) every evening., Disp: 210 tablet, Rfl: 3 Allergies  Allergen Reactions  . Aspirin Hives and Rash  . Penicillins Rash and Other (See Comments)    Has patient had a PCN reaction causing immediate rash, facial/tongue/throat swelling, SOB or lightheadedness with hypotension: Yes Has patient had a PCN reaction causing severe rash involving mucus membranes or skin necrosis: No Has patient had a PCN reaction that required hospitalization No Has patient had a PCN reaction occurring within the last 10  years: Yes If all of the above answers are "NO", then may proceed with Cephalosporin use.  Patient has tolerated cephalosporins in 2017.  . Tape Other (See Comments)    NO PAPER TAPE-MUST BE MEDICAL TAPE - unknown reaction NO PAPER TAPE-MUST BE MEDICAL TAPE - unknown reaction  . Wool Alcohol [Lanolin] Hives  . Amoxicillin Rash    Patient has tolerated  cephalosporins  . Ampicillin Itching and Rash    Patient has tolerated cephalosporins      Social History   Socioeconomic History  . Marital status: Married    Spouse name: [name]  . Number of children: 4  . Years of education: 58  . Highest education level: Not on file  Occupational History  . Occupation: disabled  Tobacco Use  . Smoking status: Former Smoker    Packs/day: 1.00    Years: 7.00    Pack years: 7.00    Types: Cigarettes    Quit date: 03/16/1969    Years since quitting: 51.6  . Smokeless tobacco: Never Used  Vaping Use  . Vaping Use: Never used  Substance and Sexual Activity  . Alcohol use: No  . Drug use: No  . Sexual activity: Not Currently    Birth control/protection: Post-menopausal  Other Topics Concern  . Not on file  Social History Narrative   Lives with husband Misty Gray - Misty Gray lives with them as do his 2 children   Daughter, Misty Gray, and her 3 children live in [place] -  [name] is incarcerated   Nephew, Misty Gray lives with them   Daughter, Misty Gray, in [name] [address]   Moved June 2013 to a better home on [facility].      Lives with husband, son, grandson, granddaughter, nephew.   Social Determinants of Health   Financial Resource Strain: Medium Risk  . Difficulty of Paying Living Expenses: Somewhat hard  Food Insecurity: No Food Insecurity  . Worried About Programme researcher, broadcasting/film/video in the Last Year: Never true  . Ran Out of Food in the Last Year: Never true  Transportation Needs: No Transportation Needs  . Lack of Transportation (Medical): No  . Lack of Transportation (Non-Medical): No  Physical Activity:   . Days of Exercise per Week: Not on file  . Minutes of Exercise per Session: Not on file  Stress:   . Feeling of Stress : Not on file  Social Connections:   . Frequency of Communication with Friends and Family: Not on file  . Frequency of Social Gatherings with Friends and  Family: Not on file  . Attends Religious Services: Not on file  . Active Member of Clubs or Organizations: Not on file  . Attends Banker Meetings: Not on file  . Marital Status: Not on file  Intimate Partner Violence:   . Fear of Current or Ex-Partner: Not on file  . Emotionally Abused: Not on file  . Physically Abused: Not on file  . Sexually Abused: Not on file    Physical Exam Cardiovascular:     Rate and Rhythm: Normal rate. Rhythm irregular.     Pulses: Normal pulses.  Pulmonary:     Effort: Pulmonary effort is normal.     Breath sounds: Normal breath sounds.  Musculoskeletal:        General: Normal range of motion.     Right lower leg: No edema.     Left lower leg: No  edema.  Skin:    General: Skin is warm and dry.     Capillary Refill: Capillary refill takes less than 2 seconds.  Neurological:     Mental Status: She is alert and oriented to person, place, and time.  Psychiatric:        Mood and Affect: Mood normal.         Future Appointments  Date Time Provider Department Center  10/30/2020  3:00 PM [facility] PA/NP [facility] None  12/12/2020  2:45 PM Galaway, Lise Auer, DPM [facility]    BP 116/60 (BP Location: Left Arm, Patient Position: Sitting, Cuff Size: Large)   Pulse 60   Resp 16   Wt 177 lb 3.2 oz (80.4 kg)   SpO2 100%   BMI 38.35 kg/m   Weight yesterday- did not weigh Last visit weight- 184 lb  Misty Gray was seen at home today after not being able to reach her for over a week. She stated she had received my messages but had been unable to call me back due to a problem with her phone. She reported feeling generally well, denying chest pain, SOB, headache, dizziness, orthopnea, fever or cough over the past week. She stated she has been compliant with her medications however she had not made the necessary changes to her medications as directed. Since her medications come prepackaged I will make the changes and place her  medications in a pillbox for the next two weeks until they are ordered again. I explained to Misty Gray that she should only take the medications in the pillbox and to call me if she has concerns. She was understanding and agreeable. I will follow up next week unless it becomes necessary sooner.   Jacqualine Code, EMT 10/12/20  ACTION: Home visit completed Next visit planned for 1 week

## 2020-10-16 DIAGNOSIS — R0902 Hypoxemia: Secondary | ICD-10-CM | POA: Diagnosis not present

## 2020-10-16 DIAGNOSIS — G4733 Obstructive sleep apnea (adult) (pediatric): Secondary | ICD-10-CM | POA: Diagnosis not present

## 2020-10-19 ENCOUNTER — Other Ambulatory Visit (HOSPITAL_COMMUNITY): Payer: Self-pay | Admitting: Internal Medicine

## 2020-10-19 ENCOUNTER — Other Ambulatory Visit: Payer: Self-pay

## 2020-10-19 ENCOUNTER — Ambulatory Visit (HOSPITAL_COMMUNITY)
Admission: RE | Admit: 2020-10-19 | Discharge: 2020-10-19 | Disposition: A | Discharge status: Home / Self Care | Payer: Medicare HMO | Source: Ambulatory Visit | Attending: Internal Medicine | Admitting: Internal Medicine

## 2020-10-19 ENCOUNTER — Other Ambulatory Visit (HOSPITAL_COMMUNITY): Payer: Self-pay

## 2020-10-19 DIAGNOSIS — I519 Heart disease, unspecified: Secondary | ICD-10-CM | POA: Diagnosis not present

## 2020-10-19 LAB — DIGOXIN LEVEL: Digoxin Level: 1 ng/mL (ref 1.0–2.0)

## 2020-10-19 NOTE — Progress Notes (Signed)
Paramedicine Encounter    Patient ID: Misty Gray, female    DOB: April 03, 1951, 69 y.o.   MRN: [id number]   Patient Care Team: Latrelle Dodrill, MD as PCP - General (Family Medicine) [name], MD (Cardiology)  Patient Active Problem List   Diagnosis Date Noted  . Hand injury, left, initial encounter 09/22/2020  . CHF exacerbation (HCC) 07/12/2020  . Hospital discharge follow-up 11/17/2019  . Onychomycosis 11/17/2019  . Sepsis due to pneumonia (HCC) 11/08/2019  . Abdominal wall hernia 11/05/2017  . Anticoagulated 11/05/2017  . Rash and nonspecific skin eruption 09/15/2017  . Ventral hernia without obstruction or gangrene 08/15/2017  . Skin irritation 07/23/2017  . Chronic venous stasis dermatitis 12/26/2016  . Urinary tract infection without hematuria   . Frequent PVCs   . Hypokalemia   . Housing problems 11/23/2016  . Chronic atrial fibrillation (HCC) 11/01/2016  . Hypotension 10/31/2016  . Special screening for malignant neoplasms, colon 08/12/2016  . Heme positive stool 08/09/2016  . Intertrigo 08/09/2016  . Chronic anticoagulation 04/26/2016  . Chronic venous insufficiency   . Preventative health care 10/05/2014  . Breast mass, left 10/05/2014  . Osteopenia 09/15/2014  . Hypertension 01/04/2014  . Chronic systolic dysfunction of left ventricle 05/05/2013  . At high risk for falls 02/11/2013  . Vitamin D deficiency 07/15/2012  . Sinoatrial node dysfunction (HCC) 05/08/2011  . Pacemaker 08/10/2009  . Allergic rhinitis 05/31/2008  . Morbid obesity (HCC) 02/12/2007  . GASTROESOPHAGEAL REFLUX, NO ESOPHAGITIS 02/12/2007  . Female stress incontinence 02/12/2007  . Seizure disorder (HCC) 02/12/2007  . OSA (obstructive sleep apnea) 02/12/2007    Current Outpatient Medications:  .  amiodarone (PACERONE) 200 MG tablet, Take 0.5 tablets (100 mg total) by mouth daily., Disp: 45 tablet, Rfl: 3 .  digoxin (LANOXIN) 0.125 MG tablet, Take 0.5 tablets (0.0625 mg total) by  mouth daily., Disp: 15 tablet, Rfl: 4 .  edoxaban (SAVAYSA) 60 MG TABS tablet, Take 60 mg by mouth daily., Disp: 30 tablet, Rfl: 11 .  fluticasone (FLONASE) 50 MCG/ACT nasal spray, Place 2 sprays into both nostrils daily as needed. For congestion., Disp: 16 g, Rfl: 1 .  losartan (COZAAR) 25 MG tablet, Take 0.5 tablets (12.5 mg total) by mouth daily., Disp: 15 tablet, Rfl: 11 .  omeprazole (PRILOSEC) 20 MG capsule, TAKE (1) CAPSULE BY MOUTH ONCE DAILY., Disp: 28 capsule, Rfl: 5 .  PHENobarbital (LUMINAL) 64.8 MG tablet, TAKE 1 TABLET BY MOUTH TWICE DAILY, Disp: 56 tablet, Rfl: 5 .  potassium chloride (KLOR-CON) 10 MEQ tablet, TAKE 4 TABLETS BY MOUTH THREE TIMES DAILY (MORNING, NOON, AND NIGHT), Disp: 336 tablet, Rfl: 11 .  spironolactone (ALDACTONE) 25 MG tablet, TAKE 1 TABLET BY MOUTH ONCE DAILY., Disp: 28 tablet, Rfl: 4 .  torsemide (DEMADEX) 20 MG tablet, Take 4 tablets (80 mg total) by mouth in the morning AND 3 tablets (60 mg total) every evening., Disp: 210 tablet, Rfl: 3 .  cholecalciferol (VITAMIN D) 25 MCG (1000 UNIT) tablet, TAKE 1 TABLET BY MOUTH ONCE DAILY., Disp: 28 tablet, Rfl: 11 Allergies  Allergen Reactions  . Aspirin Hives and Rash  . Penicillins Rash and Other (See Comments)    Has patient had a PCN reaction causing immediate rash, facial/tongue/throat swelling, SOB or lightheadedness with hypotension: Yes Has patient had a PCN reaction causing severe rash involving mucus membranes or skin necrosis: No Has patient had a PCN reaction that required hospitalization No Has patient had a PCN reaction occurring within the last 10  years: Yes If all of the above answers are "NO", then may proceed with Cephalosporin use.  Patient has tolerated cephalosporins in 2017.  . Tape Other (See Comments)    NO PAPER TAPE-MUST BE MEDICAL TAPE - unknown reaction NO PAPER TAPE-MUST BE MEDICAL TAPE - unknown reaction  . Wool Alcohol [Lanolin] Hives  . Amoxicillin Rash    Patient has tolerated  cephalosporins  . Ampicillin Itching and Rash    Patient has tolerated cephalosporins      Social History   Socioeconomic History  . Marital status: Married    Spouse name: [name]  . Number of children: 4  . Years of education: 65  . Highest education level: Not on file  Occupational History  . Occupation: disabled  Tobacco Use  . Smoking status: Former Smoker    Packs/day: 1.00    Years: 7.00    Pack years: 7.00    Types: Cigarettes    Quit date: 03/16/1969    Years since quitting: 51.6  . Smokeless tobacco: Never Used  Vaping Use  . Vaping Use: Never used  Substance and Sexual Activity  . Alcohol use: No  . Drug use: No  . Sexual activity: Not Currently    Birth control/protection: Post-menopausal  Other Topics Concern  . Not on file  Social History Narrative   Lives with husband [name] lives with them as do his 2 children   Daughter, [name], and her 3 children live in [place] -  [name] is incarcerated   Nephew, Glenna Brunkow lives with them   Daughter, [name], in [name] [address]   Moved June 2013 to a better home on [facility].      Lives with husband, son, grandson, granddaughter, nephew.   Social Determinants of Health   Financial Resource Strain: Medium Risk  . Difficulty of Paying Living Expenses: Somewhat hard  Food Insecurity: No Food Insecurity  . Worried About Programme researcher, broadcasting/film/video in the Last Year: Never true  . Ran Out of Food in the Last Year: Never true  Transportation Needs: No Transportation Needs  . Lack of Transportation (Medical): No  . Lack of Transportation (Non-Medical): No  Physical Activity:   . Days of Exercise per Week: Not on file  . Minutes of Exercise per Session: Not on file  Stress:   . Feeling of Stress : Not on file  Social Connections:   . Frequency of Communication with Friends and Family: Not on file  . Frequency of Social Gatherings with Friends and  Family: Not on file  . Attends Religious Services: Not on file  . Active Member of Clubs or Organizations: Not on file  . Attends Banker Meetings: Not on file  . Marital Status: Not on file  Intimate Partner Violence:   . Fear of Current or Ex-Partner: Not on file  . Emotionally Abused: Not on file  . Physically Abused: Not on file  . Sexually Abused: Not on file    Physical Exam Cardiovascular:     Rate and Rhythm: Normal rate and regular rhythm.     Pulses: Normal pulses.  Pulmonary:     Effort: Pulmonary effort is normal.     Breath sounds: Normal breath sounds.  Musculoskeletal:        General: Normal range of motion.     Right lower leg: No edema.     Left lower leg:  No edema.  Skin:    General: Skin is warm and dry.     Capillary Refill: Capillary refill takes less than 2 seconds.  Neurological:     Mental Status: She is alert and oriented to person, place, and time.  Psychiatric:        Mood and Affect: Mood normal.         Future Appointments  Date Time Provider Department Center  10/30/2020  3:00 PM [facility] PA/NP [facility] None  12/12/2020  2:45 PM Galaway, Lise Auer, DPM [facility]    BP 93/60 (BP Location: Left Arm, Patient Position: Sitting, Cuff Size: Normal)   Pulse 60   Wt 175 lb 3.2 oz (79.5 kg)   SpO2 96%   BMI 37.91 kg/m   Weight yesterday- 177.2 lb Last visit weight- 177.2 lb  Ms Mccullars was seen at home today and reported feeling well. She denied chest pain, SOB. Headache, dizziness, orthopnea, fever or cough over the past week. She stated she has been compliant with his medications and her weight has been stable. Her medications were verified and her pillbox was refilled. I call the pharmacy and was able to confirm that he new dose of digoxin was received and they advised they would fill her bubble packs and deliver them by tomorrow. I instructed her to use her pillbox until it is empty and then switch to the bubble packs  next week. She was understanding and agreeable. While I was present I obtained blood to have the clinic run a digoxin level following her dose change last week. The was in accordance with orders from [facility], [place].  I will follow up in two weeks.   Jacqualine Code, EMT 10/19/20  ACTION: Home visit completed Next visit planned for 2 weeks

## 2020-10-24 ENCOUNTER — Telehealth (HOSPITAL_COMMUNITY): Payer: Self-pay | Admitting: Cardiology

## 2020-10-24 NOTE — Telephone Encounter (Signed)
-----   Message from Allayne Butcher, [place] sent at 10/20/2020 10:01 AM EDT ----- Dig level lower but still elevated, despite dose reduction. Have pt stop digoxin completely. Zack please make sure she discards digoxin.

## 2020-10-24 NOTE — Telephone Encounter (Signed)
done

## 2020-10-30 ENCOUNTER — Encounter (HOSPITAL_COMMUNITY): Payer: Self-pay

## 2020-10-30 ENCOUNTER — Other Ambulatory Visit: Payer: Self-pay

## 2020-10-30 ENCOUNTER — Ambulatory Visit (HOSPITAL_COMMUNITY)
Admission: RE | Admit: 2020-10-30 | Discharge: 2020-10-30 | Disposition: A | Discharge status: Home / Self Care | Payer: Medicare HMO | Source: Ambulatory Visit | Attending: Adult Health | Admitting: Adult Health

## 2020-10-30 ENCOUNTER — Ambulatory Visit: Payer: Medicare HMO

## 2020-10-30 VITALS — BP 102/60 | HR 64 | Wt 183.0 lb

## 2020-10-30 DIAGNOSIS — I495 Sick sinus syndrome: Secondary | ICD-10-CM | POA: Insufficient documentation

## 2020-10-30 DIAGNOSIS — Z8673 Personal history of transient ischemic attack (TIA), and cerebral infarction without residual deficits: Secondary | ICD-10-CM | POA: Insufficient documentation

## 2020-10-30 DIAGNOSIS — Z79899 Other long term (current) drug therapy: Secondary | ICD-10-CM | POA: Insufficient documentation

## 2020-10-30 DIAGNOSIS — I493 Ventricular premature depolarization: Secondary | ICD-10-CM | POA: Insufficient documentation

## 2020-10-30 DIAGNOSIS — Z87891 Personal history of nicotine dependence: Secondary | ICD-10-CM | POA: Diagnosis not present

## 2020-10-30 DIAGNOSIS — Z950 Presence of cardiac pacemaker: Secondary | ICD-10-CM | POA: Insufficient documentation

## 2020-10-30 DIAGNOSIS — Z888 Allergy status to other drugs, medicaments and biological substances status: Secondary | ICD-10-CM | POA: Insufficient documentation

## 2020-10-30 DIAGNOSIS — Z880 Allergy status to penicillin: Secondary | ICD-10-CM | POA: Diagnosis not present

## 2020-10-30 DIAGNOSIS — I110 Hypertensive heart disease with heart failure: Secondary | ICD-10-CM | POA: Diagnosis not present

## 2020-10-30 DIAGNOSIS — I482 Chronic atrial fibrillation, unspecified: Secondary | ICD-10-CM | POA: Insufficient documentation

## 2020-10-30 DIAGNOSIS — I5022 Chronic systolic (congestive) heart failure: Secondary | ICD-10-CM | POA: Insufficient documentation

## 2020-10-30 DIAGNOSIS — Z886 Allergy status to analgesic agent status: Secondary | ICD-10-CM | POA: Diagnosis not present

## 2020-10-30 DIAGNOSIS — Z7901 Long term (current) use of anticoagulants: Secondary | ICD-10-CM | POA: Insufficient documentation

## 2020-10-30 DIAGNOSIS — Z6839 Body mass index (BMI) 39.0-39.9, adult: Secondary | ICD-10-CM | POA: Diagnosis not present

## 2020-10-30 DIAGNOSIS — Z823 Family history of stroke: Secondary | ICD-10-CM | POA: Insufficient documentation

## 2020-10-30 DIAGNOSIS — I428 Other cardiomyopathies: Secondary | ICD-10-CM | POA: Insufficient documentation

## 2020-10-30 DIAGNOSIS — G4733 Obstructive sleep apnea (adult) (pediatric): Secondary | ICD-10-CM | POA: Insufficient documentation

## 2020-10-30 DIAGNOSIS — Z8249 Family history of ischemic heart disease and other diseases of the circulatory system: Secondary | ICD-10-CM | POA: Diagnosis not present

## 2020-10-30 NOTE — Progress Notes (Signed)
ReDS Vest / Clip - 10/30/20 1500      ReDS Vest / Clip   Station Marker B    Ruler Value 36    ReDS Value Range Low volume    ReDS Actual Value 31    Anatomical Comments done on left side

## 2020-10-30 NOTE — Progress Notes (Signed)
Heart Failure Date:  10/30/2020   ID:  Misty Gray, DOB 19-Jan-1951, MRN [id number]  Location: Home  Provider location: [facility] Type of Visit: Established patient  PCP:  Latrelle Dodrill, MD- Internal Medicine   Cardiologist:  No primary care provider on file. Primary HF: [name] EP: Dr Johney Frame   Chief Complaint: F/u for Chronic Systolic Heart Failure    History of Present Illness: Misty Gray is a 69 y/o woman with history of morbid obesity, OSA, chronic atrial fibrillation with tachy-brady syndrome s/p PPM, frequent PVCs, systolic HF due to NICM.  Admitted for ADHF in 10/17. Cath with normal coronary arteries and elevated filling pressures. Echo 10/17 EF 30-35% (previously 35-40%). Felt to have possible PVC or RV pacing cardiomyopathy. (She has 81% RV pacing and 15-20 PVCs per minute). Started on po amio with suppression of PVCs. Diuresed 41 pounds. Discharge weight 197.   Admitted 12/14 through 12/06/2016 with marked volume overload. Discharge weight was 195 pounds.Transitioned to torsemide 40 mg in am and 20 mg in pm.   Echo 07/2017 LVEF 25-30%, Trivial AI, Mod MR, Severe LAE, Mild RV dilation, Mild RAE, PA peak pressure 31 mm Hg.  Echo 1/20 EF 25-30% (no improvement with PVC suppression)  Admitted 07/2020 with A/C systolic heart failure. Volume overloaded. Diuresed with IV lasix and transitioned to torsemide 60 mg twice a day. RV pacing 28% of the time by device interrogation.  Very limited GDMT due to inability to tolerate HF meds.  Excellent diuresis w/ IV Lasix, overall down 21 lb. Decreased backup pacing rate to 50 bpm from 70 bpm. She is unable to perform remote device transmissions because of her housing situation. Discharge date 07/18/2020.  Today she returns for HF follow up with son. Overall feeling fine. Rarely leaves the house. SOB with exertion. Denies PND/Orthopnea. No bleeding issues. Appetite ok. No fever or chills. Weight at  home 181 pounds. Says she has hard time weighing.  Taking all medications. All medications are now in a bubble pack. . Following by HF Paramedicine.   Studies: ECHO 06/2020  EF 25-30% RV moderately reduced and enlarged.   R/LHC 10/03/16 Ao = 94/58 (72) LV = 104/20 RA = 21 RV = 65/4/19 PA = 65/21 (38) PCW = 23 Fick cardiac output/index = 6.1/2.9 PVR = 2.5 WU FA sat = 97%  PA sat = 69%, 70% Assessment: 1. Normal coronary arteries 2. NICM with EF 30-35% by echo 3. Persistently elevated biventricular pressures   Past Medical History:  Diagnosis Date  . Abnormal CT of the head 12/16/1984   R parietal atrophy  . Allergic rhinitis, cause unspecified   . Altered mental status 11/28/2016  . Anemia   . Arthritis    "hands and knees" (09/06/2015)  . Atrial fibrillation (HCC)   . Cellulitis 09/07/2015  . Cellulitis and abscess of leg 09/2016   bilateral  . CHF (congestive heart failure) (HCC)   . Diaphragmatic hernia without mention of obstruction or gangrene   . Dyspnea   . Exertional dyspnea 06/22/12  . Family history of adverse reaction to anesthesia    "daughter fights w/them; just can't relax during OR; stay awake during the OR"  . Fatigue 06/22/2012  . Female stress incontinence   . GERD (gastroesophageal reflux disease)   . Grand mal seizure (HCC)   . H/O hiatal hernia    two removed  . Heart murmur   . High cholesterol   .  History of blood transfusion 1956   "related to nose bleed"  . Hypertension   . Influenza A 01/11/2014  . Lichenification and lichen simplex chronicus   . Migraine    "when I was a teenager"  . Morbid obesity (HCC)   . Nonischemic cardiomyopathy (HCC)    EF has normalized-repeat Pending   . On home oxygen therapy    "2L when I'm asleep in bed" (09/06/2015)  . OSA on CPAP   . Pacemaker  st judes    Patient states "this is my third pacemaker"  . Pneumonia 2004; 2011; 11/2011  . Seizures (HCC) since 1981   "can hear you talking but sounds like you  are in big tunnel; have them often if not taking RX; last one was 06/17/12" (09/06/2015)  . Shortness of breath 10/24/2010   Qualifier: Diagnosis of  By: [place], Bonnie    . Sick sinus syndrome (HCC)    WITH PRIOR DDD PACEMAKER IMPLANTATION  . Somnolence   . Stroke [facility]) 1988   "mouth drawed real bad on my left side; found out it was a seizure"  . Syncope and collapse 06/22/12   "hit forehead and left knee"; denies loss of consciousness  . Unspecified venous (peripheral) insufficiency    Past Surgical History:  Procedure Laterality Date  . APPENDECTOMY    . CARDIAC CATHETERIZATION N/A 10/03/2016   Procedure: Right/Left Heart Cath and Coronary Angiography;  Surgeon: Dolores Patty, MD;  Location: [facility] INVASIVE CV LAB;  Service: Cardiovascular;  Laterality: N/A;  . HERNIA REPAIR  ? 1988; 04/15/1998  . INSERT / REPLACE / REMOVE PACEMAKER  12/16/1990   DDD EF 30%;  Marland Kitchen INSERT / REPLACE / REMOVE PACEMAKER  07/25/2003   replaced by BB, leads are both IS1 and from 1992  . INSERT / REPLACE / REMOVE PACEMAKER  05/21/2011   JA; generator change  . LEG SKIN LESION  BIOPSY / EXCISION  05/16/2001   Lichen Planus  . VENTRAL HERNIA REPAIR  1989; 04/15/1998     Current Outpatient Medications  Medication Sig Dispense Refill  . amiodarone (PACERONE) 200 MG tablet Take 0.5 tablets (100 mg total) by mouth daily. 45 tablet 3  . cholecalciferol (VITAMIN D) 25 MCG (1000 UNIT) tablet TAKE 1 TABLET BY MOUTH ONCE DAILY. 28 tablet 11  . edoxaban (SAVAYSA) 60 MG TABS tablet Take 60 mg by mouth daily. 30 tablet 11  . fluticasone (FLONASE) 50 MCG/ACT nasal spray Place 2 sprays into both nostrils daily as needed. For congestion. 16 g 1  . losartan (COZAAR) 25 MG tablet Take 0.5 tablets (12.5 mg total) by mouth daily. 15 tablet 11  . omeprazole (PRILOSEC) 20 MG capsule TAKE (1) CAPSULE BY MOUTH ONCE DAILY. 28 capsule 5  . PHENobarbital (LUMINAL) 64.8 MG tablet TAKE 1 TABLET BY MOUTH TWICE DAILY 56 tablet 5  . potassium  chloride (KLOR-CON) 10 MEQ tablet TAKE 4 TABLETS BY MOUTH THREE TIMES DAILY (MORNING, NOON, AND NIGHT) 336 tablet 11  . spironolactone (ALDACTONE) 25 MG tablet TAKE 1 TABLET BY MOUTH ONCE DAILY. 28 tablet 0  . torsemide (DEMADEX) 20 MG tablet Take 4 tablets (80 mg total) by mouth in the morning AND 3 tablets (60 mg total) every evening. 210 tablet 3   No current facility-administered medications for this encounter.    Allergies:   Aspirin, Penicillins, Tape, Wool alcohol [lanolin], Amoxicillin, and Ampicillin   Social History:  The patient  reports that she quit smoking about 51 years ago. Her  smoking use included cigarettes. She has a 7.00 pack-year smoking history. She has never used smokeless tobacco. She reports that she does not drink alcohol and does not use drugs.   Family History:  The patient's family history includes Cancer in her mother and sister; Diabetes in her son; Heart disease in her father; Hypertension in her brother and sister; Rheum arthritis in her daughter; Sleep apnea in her son; Stroke in her father.   ROS:  Please see the history of present illness.   All other systems are personally reviewed and negative.  Vitals:   10/30/20 1459  BP: 102/60  Pulse: 64  SpO2: 98%   Wt Readings from Last 3 Encounters:  10/30/20 83 kg (183 lb)  10/19/20 79.5 kg (175 lb 3.2 oz)  10/12/20 80.4 kg (177 lb 3.2 oz)    Reds Clip 31%  PHYSICAL EXAM: General: appears chronically ill. No resp difficulty. Arrived in a wheel chair.  HEENT: normal Neck: supple. no JVD. Carotids 2+ bilat; no bruits. No lymphadenopathy or thryomegaly appreciated. Cor: PMI nondisplaced. Irregular rate & rhythm. No rubs, gallops or murmurs. Lungs: clear Abdomen: soft, nontender, nondistended. No hepatosplenomegaly. No bruits or masses. Good bowel sounds. Extremities: no cyanosis, clubbing, rash, R and LLE 1+ edema Neuro: alert & orientedx3, cranial nerves grossly intact. moves all 4 extremities w/o  difficulty. Affect pleasant   Recent Labs: 07/16/2020: Magnesium 2.0 09/05/2020: B Natriuretic Peptide 1,407.3 09/27/2020: ALT 14; BUN 24; Creatinine, Ser 0.95; Hemoglobin 12.5; Platelets ACLMP; Potassium 4.0; Sodium 137; TSH 1.175  Personally reviewed   Wt Readings from Last 3 Encounters:  10/30/20 83 kg (183 lb)  10/19/20 79.5 kg (175 lb 3.2 oz)  10/12/20 80.4 kg (177 lb 3.2 oz)      ASSESSMENT AND PLAN:  1. Chronic systolic HF due to NICM. ? PVC induced vs RV pacing. Echo 12/06/2016 EF 10-15%. Cath 10/17 normal coronary arteries.  Echo 02/2017 with LVEF 15-20% despite PVC suppression. Echo 07/2017 EF 25-30%.  Trivial AI, Mod MR, Severe LAE, Mild RV dilation, Mild RAE, PA peak pressure 31 mm Hg. Echo 1/20 EF 25-30%. ECHO 06/2020 EF 25-30% RV moderately reduced.  - NYHA III. Volume status stable. Recent BMET stable on 09/27/20.  - Continue  torsemide to 80 mg qam and 60 mg qpm - Continue spiro 25 mg daily   - Continue losartan 12.5 mg daily (did not tolerate higher dose previously due to orthostatic  hypotension)  - Digoxin stopped due to elevated level.    - Would not use SGLT2i given high risk of genital yeast infection - intolerant to  blockers - EP did not recommend ICD. Followed by Dr Johney Frame.   2. Frequent PVCs - Suppressed. Continue Amio 100 mg daily.   - TSH and LFTs stable.   - needs annual eye exams   3. Obesity  Body mass index is 39.6 kg/m.  4. Chronic AF with tachy-brady s/p pacemaker -  intolerant to  blockers - Rate controlled.  - on low dose amio, 100 mg daily - TSH stable 09/27/20 - Needs annual eye exam   - on endoxaban for stroke prophylaxis.  - PPM followed by Dr. Johney Frame   5. Symptomatic Bradycardia  Has PPM followed by Dr Johney Frame.   Follow up in 3 months.      Waneta Martins, NP  10/30/2020 3:14 PM  [facility] [address] [facility] [place] [address] [phone]  (office) [phone] (fax)

## 2020-10-30 NOTE — Patient Instructions (Signed)
Your physician recommends that you schedule a follow-up appointment  3 months:on [phone] @1 :30  If you have any questions or concerns before your next appointment please send a message through [place] or call our office at [phone].    TO LEAVE A MESSAGE FOR THE NURSE SELECT OPTION 2, PLEASE LEAVE A MESSAGE INCLUDING: . YOUR NAME . DATE OF BIRTH . CALL BACK NUMBER . REASON FOR CALL**this is important as we prioritize the call backs  YOU WILL RECEIVE A CALL BACK THE SAME DAY AS LONG AS YOU CALL BEFORE 4:00 PM  At the [facility], you and your health needs are our priority. As part of our continuing mission to provide you with exceptional heart care, we have created designated Provider Care Teams. These Care Teams include your primary Cardiologist (physician) and Advanced Practice Providers (APPs- Physician Assistants and Nurse Practitioners) who all work together to provide you with the care you need, when you need it.   You may see any of the following providers on your designated Care Team at your next follow up: [phone] Dr Marland Kitchen . Dr Arvilla Meres . Marca Ancona, NP . Tonye Becket, PA . Robbie Lis, PharmD   Please be sure to bring in all your medications bottles to every appointment.

## 2020-10-31 ENCOUNTER — Telehealth: Payer: Self-pay | Admitting: Emergency Medicine

## 2020-10-31 LAB — CUP PACEART REMOTE DEVICE CHECK
Battery Remaining Longevity: 5 mo
Battery Remaining Percentage: 5 %
Battery Voltage: 2.66 V
Brady Statistic AP VP Percent: 0 %
Brady Statistic AP VS Percent: 0 %
Brady Statistic AS VP Percent: 0 %
Brady Statistic AS VS Percent: 0 %
Brady Statistic RA Percent Paced: 0 %
Brady Statistic RV Percent Paced: 28 %
Date Time Interrogation Session: 20211115151248
Implantable Lead Implant Date: 19920327
Implantable Lead Implant Date: 19920327
Implantable Lead Location: 753859
Implantable Lead Location: 753860
Implantable Pulse Generator Implant Date: 20120605
Lead Channel Impedance Value: 350 Ohm
Lead Channel Impedance Value: 480 Ohm
Lead Channel Pacing Threshold Amplitude: 1 V
Lead Channel Pacing Threshold Amplitude: 1.5 V
Lead Channel Pacing Threshold Pulse Width: 0.5 ms
Lead Channel Pacing Threshold Pulse Width: 1 ms
Lead Channel Sensing Intrinsic Amplitude: 0.2 mV
Lead Channel Sensing Intrinsic Amplitude: 5.7 mV
Lead Channel Setting Pacing Amplitude: 2.5 V
Lead Channel Setting Pacing Amplitude: 2.5 V
Lead Channel Setting Pacing Pulse Width: 1 ms
Lead Channel Setting Sensing Sensitivity: 1.5 mV
Pulse Gen Model: 2210
Pulse Gen Serial Number: 7254513

## 2020-10-31 NOTE — Telephone Encounter (Signed)
LMOM to call DC. Phone # and office hours provided.  Notify patient to send monthly remotes due to 4.5 months from RRT.12/04/20 is next remote send.

## 2020-11-01 NOTE — Telephone Encounter (Signed)
Patient notified of monthly battery checks and schedule provided.

## 2020-11-15 ENCOUNTER — Other Ambulatory Visit (HOSPITAL_COMMUNITY): Payer: Self-pay

## 2020-11-15 DIAGNOSIS — G4733 Obstructive sleep apnea (adult) (pediatric): Secondary | ICD-10-CM | POA: Diagnosis not present

## 2020-11-15 DIAGNOSIS — R0902 Hypoxemia: Secondary | ICD-10-CM | POA: Diagnosis not present

## 2020-11-15 NOTE — Progress Notes (Signed)
Paramedicine Encounter    Patient ID: Misty Gray, female    DOB: [birth year], 69 y.o.   MRN: [id number]   Patient Care Team: Latrelle Dodrill, MD as PCP - General (Family Medicine) [name], MD (Cardiology)  Patient Active Problem List   Diagnosis Date Noted  . Hand injury, left, initial encounter 09/22/2020  . CHF exacerbation (HCC) 07/12/2020  . Hospital discharge follow-up 11/17/2019  . Onychomycosis 11/17/2019  . Sepsis due to pneumonia (HCC) 11/08/2019  . Abdominal wall hernia 11/05/2017  . Anticoagulated 11/05/2017  . Rash and nonspecific skin eruption 09/15/2017  . Ventral hernia without obstruction or gangrene 08/15/2017  . Skin irritation 07/23/2017  . Chronic venous stasis dermatitis 12/26/2016  . Urinary tract infection without hematuria   . Frequent PVCs   . Hypokalemia   . Housing problems 11/23/2016  . Chronic atrial fibrillation (HCC) 11/01/2016  . Hypotension 10/31/2016  . Special screening for malignant neoplasms, colon 08/12/2016  . Heme positive stool 08/09/2016  . Intertrigo 08/09/2016  . Chronic anticoagulation 04/26/2016  . Chronic venous insufficiency   . Preventative health care 10/05/2014  . Breast mass, left 10/05/2014  . Osteopenia 09/15/2014  . Hypertension 01/04/2014  . Chronic systolic dysfunction of left ventricle 05/05/2013  . At high risk for falls 02/11/2013  . Vitamin D deficiency 07/15/2012  . Sinoatrial node dysfunction (HCC) 05/08/2011  . Pacemaker 08/10/2009  . Allergic rhinitis 05/31/2008  . Morbid obesity (HCC) 02/12/2007  . GASTROESOPHAGEAL REFLUX, NO ESOPHAGITIS 02/12/2007  . Female stress incontinence 02/12/2007  . Seizure disorder (HCC) 02/12/2007  . OSA (obstructive sleep apnea) 02/12/2007    Current Outpatient Medications:  .  amiodarone (PACERONE) 200 MG tablet, Take 0.5 tablets (100 mg total) by mouth daily., Disp: 45 tablet, Rfl: 3 .  cholecalciferol (VITAMIN D) 25 MCG (1000 UNIT) tablet, TAKE 1 TABLET BY  MOUTH ONCE DAILY., Disp: 28 tablet, Rfl: 11 .  edoxaban (SAVAYSA) 60 MG TABS tablet, Take 60 mg by mouth daily., Disp: 30 tablet, Rfl: 11 .  fluticasone (FLONASE) 50 MCG/ACT nasal spray, Place 2 sprays into both nostrils daily as needed. For congestion., Disp: 16 g, Rfl: 1 .  losartan (COZAAR) 25 MG tablet, Take 0.5 tablets (12.5 mg total) by mouth daily., Disp: 15 tablet, Rfl: 11 .  omeprazole (PRILOSEC) 20 MG capsule, TAKE (1) CAPSULE BY MOUTH ONCE DAILY., Disp: 28 capsule, Rfl: 5 .  PHENobarbital (LUMINAL) 64.8 MG tablet, TAKE 1 TABLET BY MOUTH TWICE DAILY, Disp: 56 tablet, Rfl: 5 .  potassium chloride (KLOR-CON) 10 MEQ tablet, TAKE 4 TABLETS BY MOUTH THREE TIMES DAILY (MORNING, NOON, AND NIGHT), Disp: 336 tablet, Rfl: 11 .  spironolactone (ALDACTONE) 25 MG tablet, TAKE 1 TABLET BY MOUTH ONCE DAILY., Disp: 28 tablet, Rfl: 0 .  torsemide (DEMADEX) 20 MG tablet, Take 4 tablets (80 mg total) by mouth in the morning AND 3 tablets (60 mg total) every evening., Disp: 210 tablet, Rfl: 3 Allergies  Allergen Reactions  . Aspirin Hives and Rash  . Penicillins Rash and Other (See Comments)    Has patient had a PCN reaction causing immediate rash, facial/tongue/throat swelling, SOB or lightheadedness with hypotension: Yes Has patient had a PCN reaction causing severe rash involving mucus membranes or skin necrosis: No Has patient had a PCN reaction that required hospitalization No Has patient had a PCN reaction occurring within the last 10 years: Yes If all of the above answers are "NO", then may proceed with Cephalosporin use.  Patient has tolerated cephalosporins  in 2017.  . Tape Other (See Comments)    NO PAPER TAPE-MUST BE MEDICAL TAPE - unknown reaction NO PAPER TAPE-MUST BE MEDICAL TAPE - unknown reaction  . Wool Alcohol [Lanolin] Hives  . Amoxicillin Rash    Patient has tolerated cephalosporins  . Ampicillin Itching and Rash    Patient has tolerated cephalosporins      Social History    Socioeconomic History  . Marital status: Married    Spouse name: [name]  . Number of children: 4  . Years of education: 44  . Highest education level: Not on file  Occupational History  . Occupation: disabled  Tobacco Use  . Smoking status: Former Smoker    Packs/day: 1.00    Years: 7.00    Pack years: 7.00    Types: Cigarettes    Quit date: 03/16/1969    Years since quitting: 51.7  . Smokeless tobacco: Never Used  Vaping Use  . Vaping Use: Never used  Substance and Sexual Activity  . Alcohol use: No  . Drug use: No  . Sexual activity: Not Currently    Birth control/protection: Post-menopausal  Other Topics Concern  . Not on file  Social History Narrative   Lives with husband [name] lives with them as do his 2 children   Daughter, [name], and her 3 children live in [place] -  [name] is incarcerated   Nephew, Ikram Riebe lives with them   Daughter, [name], in [name] [address]   Moved June 2013 to a better home on [facility].      Lives with husband, son, grandson, granddaughter, nephew.   Social Determinants of Health   Financial Resource Strain: Medium Risk  . Difficulty of Paying Living Expenses: Somewhat hard  Food Insecurity: No Food Insecurity  . Worried About Programme researcher, broadcasting/film/video in the Last Year: Never true  . Ran Out of Food in the Last Year: Never true  Transportation Needs: No Transportation Needs  . Lack of Transportation (Medical): No  . Lack of Transportation (Non-Medical): No  Physical Activity:   . Days of Exercise per Week: Not on file  . Minutes of Exercise per Session: Not on file  Stress:   . Feeling of Stress : Not on file  Social Connections:   . Frequency of Communication with Friends and Family: Not on file  . Frequency of Social Gatherings with Friends and Family: Not on file  . Attends Religious Services: Not on file  . Active Member of Clubs or Organizations: Not on  file  . Attends Banker Meetings: Not on file  . Marital Status: Not on file  Intimate Partner Violence:   . Fear of Current or Ex-Partner: Not on file  . Emotionally Abused: Not on file  . Physically Abused: Not on file  . Sexually Abused: Not on file    Physical Exam Cardiovascular:     Rate and Rhythm: Normal rate and regular rhythm.     Pulses: Normal pulses.  Pulmonary:     Effort: Pulmonary effort is normal.     Breath sounds: Normal breath sounds.  Musculoskeletal:        General: Normal range of motion.     Right lower leg: Edema present.     Left lower leg: Edema present.  Skin:    General: Skin is warm and dry.     Capillary Refill: Capillary refill  takes less than 2 seconds.  Neurological:     Mental Status: She is alert and oriented to person, place, and time.  Psychiatric:        Mood and Affect: Mood normal.         Future Appointments  Date Time Provider Department Center  12/04/2020  7:55 AM [facility] DEVICE REMOTES [name]  12/12/2020  2:45 PM Freddie Breech, DPM [facility]  01/04/2021  7:55 AM [facility] DEVICE REMOTES [name]  01/30/2021  1:30 PM [facility] PA/NP [facility] None  02/05/2021  7:55 AM [facility] DEVICE REMOTES [name]  03/08/2021  7:55 AM [facility] DEVICE REMOTES [name]  04/09/2021  7:55 AM [facility] DEVICE REMOTES [name]  05/10/2021  7:55 AM [facility] DEVICE REMOTES [name]  06/11/2021  7:55 AM [facility] DEVICE REMOTES [name]    BP 116/77 (BP Location: Left Arm, Patient Position: Sitting, Cuff Size: Large)   Pulse 68   Resp 16   Wt 171 lb (77.6 kg)   SpO2 100%   BMI 37.00 kg/m   Weight yesterday- Can't remember Last visit weight- 183 lb  Ms Grabinski was seen at home today and reported feeling well. She denied chest pain, SOB, headache, dizziness, orthopnea, fever or cough over the  past week. She stated she has been compliant with her medications and her weight has been trending down since her clinic appointment. Her medications were verified and her pill packs were checked for accuracy. Nothing further was needed at this time. I will follow up in two weeks.   Jacqualine Code, EMT 11/15/20  ACTION: Home visit completed Next visit planned for 2 weeks

## 2020-11-22 ENCOUNTER — Emergency Department (HOSPITAL_COMMUNITY): Payer: Medicare HMO

## 2020-11-22 ENCOUNTER — Encounter (HOSPITAL_COMMUNITY): Payer: Self-pay | Admitting: Emergency Medicine

## 2020-11-22 ENCOUNTER — Emergency Department (HOSPITAL_COMMUNITY)
Admission: EM | Admit: 2020-11-22 | Discharge: 2020-11-22 | Disposition: A | Discharge status: Home / Self Care | Payer: Medicare HMO | Attending: Emergency Medicine | Admitting: Emergency Medicine

## 2020-11-22 ENCOUNTER — Other Ambulatory Visit: Payer: Self-pay

## 2020-11-22 DIAGNOSIS — Z950 Presence of cardiac pacemaker: Secondary | ICD-10-CM | POA: Diagnosis not present

## 2020-11-22 DIAGNOSIS — S52601A Unspecified fracture of lower end of right ulna, initial encounter for closed fracture: Secondary | ICD-10-CM | POA: Insufficient documentation

## 2020-11-22 DIAGNOSIS — S62101A Fracture of unspecified carpal bone, right wrist, initial encounter for closed fracture: Secondary | ICD-10-CM | POA: Diagnosis not present

## 2020-11-22 DIAGNOSIS — S6991XA Unspecified injury of right wrist, hand and finger(s), initial encounter: Secondary | ICD-10-CM | POA: Diagnosis present

## 2020-11-22 DIAGNOSIS — Z87891 Personal history of nicotine dependence: Secondary | ICD-10-CM | POA: Insufficient documentation

## 2020-11-22 DIAGNOSIS — W1839XA Other fall on same level, initial encounter: Secondary | ICD-10-CM | POA: Diagnosis not present

## 2020-11-22 DIAGNOSIS — I110 Hypertensive heart disease with heart failure: Secondary | ICD-10-CM | POA: Insufficient documentation

## 2020-11-22 DIAGNOSIS — I509 Heart failure, unspecified: Secondary | ICD-10-CM | POA: Diagnosis not present

## 2020-11-22 DIAGNOSIS — Z79899 Other long term (current) drug therapy: Secondary | ICD-10-CM | POA: Diagnosis not present

## 2020-11-22 DIAGNOSIS — S52501A Unspecified fracture of the lower end of right radius, initial encounter for closed fracture: Secondary | ICD-10-CM | POA: Diagnosis not present

## 2020-11-22 DIAGNOSIS — M25531 Pain in right wrist: Secondary | ICD-10-CM | POA: Diagnosis not present

## 2020-11-22 MED ORDER — HYDROCODONE-ACETAMINOPHEN 5-325 MG PO TABS
1.0000 | ORAL_TABLET | Freq: Four times a day (QID) | ORAL | 0 refills | Status: DC | PRN
Start: 2020-11-22 — End: 2021-05-03

## 2020-11-22 MED ORDER — HYDROCODONE-ACETAMINOPHEN 5-325 MG PO TABS
1.0000 | ORAL_TABLET | Freq: Four times a day (QID) | ORAL | 0 refills | Status: DC | PRN
Start: 2020-11-22 — End: 2020-11-22

## 2020-11-22 NOTE — ED Provider Notes (Signed)
[facility] Note   CSN: [id number] Arrival date & time: 11/22/20  1821     History Chief Complaint  Patient presents with  . Fall  . Wrist Injury    Misty Gray is a 69 y.o. female.  Patient is a 69 year old female with history of hypertension, hyperlipidemia, nonischemic cardiomyopathy.  She presents today for evaluation of fall and right wrist injury.  Patient was playing with her dog when she fell backwards on an outstretched right hand.  She is having pain in the right wrist.  She denies other injury.  The history is provided by the patient.  Fall This is a new problem. The current episode started 3 to 5 hours ago. The problem occurs constantly. The problem has not changed since onset.Exacerbated by: Movement and palpation. The symptoms are relieved by rest. She has tried nothing for the symptoms.       Past Medical History:  Diagnosis Date  . Abnormal CT of the head 12/16/1984   R parietal atrophy  . Allergic rhinitis, cause unspecified   . Altered mental status 11/28/2016  . Anemia   . Arthritis    "hands and knees" (09/06/2015)  . Atrial fibrillation (HCC)   . Cellulitis 09/07/2015  . Cellulitis and abscess of leg 09/2016   bilateral  . CHF (congestive heart failure) (HCC)   . Diaphragmatic hernia without mention of obstruction or gangrene   . Dyspnea   . Exertional dyspnea 06/22/12  . Family history of adverse reaction to anesthesia    "daughter fights w/them; just can't relax during OR; stay awake during the OR"  . Fatigue 06/22/2012  . Female stress incontinence   . GERD (gastroesophageal reflux disease)   . Grand mal seizure (HCC)   . H/O hiatal hernia    two removed  . Heart murmur   . High cholesterol   . History of blood transfusion 1956   "related to nose bleed"  . Hypertension   . Influenza A 01/11/2014  . Lichenification and lichen simplex chronicus   . Migraine    "when I was a teenager"  . Morbid  obesity (HCC)   . Nonischemic cardiomyopathy (HCC)    EF has normalized-repeat Pending   . On home oxygen therapy    "2L when I'm asleep in bed" (09/06/2015)  . OSA on CPAP   . Pacemaker  st judes    Patient states "this is my third pacemaker"  . Pneumonia 2004; 2011; 11/2011  . Seizures (HCC) since 1981   "can hear you talking but sounds like you are in big tunnel; have them often if not taking RX; last one was 06/17/12" (09/06/2015)  . Shortness of breath 10/24/2010   Qualifier: Diagnosis of  By: [place], Bonnie    . Sick sinus syndrome (HCC)    WITH PRIOR DDD PACEMAKER IMPLANTATION  . Somnolence   . Stroke [facility]) 1988   "mouth drawed real bad on my left side; found out it was a seizure"  . Syncope and collapse 06/22/12   "hit forehead and left knee"; denies loss of consciousness  . Unspecified venous (peripheral) insufficiency     Patient Active Problem List   Diagnosis Date Noted  . Hand injury, left, initial encounter 09/22/2020  . CHF exacerbation (HCC) 07/12/2020  . Hospital discharge follow-up 11/17/2019  . Onychomycosis 11/17/2019  . Sepsis due to pneumonia (HCC) 11/08/2019  . Abdominal wall hernia 11/05/2017  . Anticoagulated 11/05/2017  . Rash and nonspecific  skin eruption 09/15/2017  . Ventral hernia without obstruction or gangrene 08/15/2017  . Skin irritation 07/23/2017  . Chronic venous stasis dermatitis 12/26/2016  . Urinary tract infection without hematuria   . Frequent PVCs   . Hypokalemia   . Housing problems 11/23/2016  . Chronic atrial fibrillation (HCC) 11/01/2016  . Hypotension 10/31/2016  . Special screening for malignant neoplasms, colon 08/12/2016  . Heme positive stool 08/09/2016  . Intertrigo 08/09/2016  . Chronic anticoagulation 04/26/2016  . Chronic venous insufficiency   . Preventative health care 10/05/2014  . Breast mass, left 10/05/2014  . Osteopenia 09/15/2014  . Hypertension 01/04/2014  . Chronic systolic dysfunction of left ventricle  05/05/2013  . At high risk for falls 02/11/2013  . Vitamin D deficiency 07/15/2012  . Sinoatrial node dysfunction (HCC) 05/08/2011  . Pacemaker 08/10/2009  . Allergic rhinitis 05/31/2008  . Morbid obesity (HCC) 02/12/2007  . GASTROESOPHAGEAL REFLUX, NO ESOPHAGITIS 02/12/2007  . Female stress incontinence 02/12/2007  . Seizure disorder (HCC) 02/12/2007  . OSA (obstructive sleep apnea) 02/12/2007    Past Surgical History:  Procedure Laterality Date  . APPENDECTOMY    . CARDIAC CATHETERIZATION N/A 10/03/2016   Procedure: Right/Left Heart Cath and Coronary Angiography;  Surgeon: Dolores Patty, MD;  Location: [facility] INVASIVE CV LAB;  Service: Cardiovascular;  Laterality: N/A;  . HERNIA REPAIR  ? 1988; 04/15/1998  . INSERT / REPLACE / REMOVE PACEMAKER  12/16/1990   DDD EF 30%;  Marland Kitchen INSERT / REPLACE / REMOVE PACEMAKER  07/25/2003   replaced by BB, leads are both IS1 and from 1992  . INSERT / REPLACE / REMOVE PACEMAKER  05/21/2011   JA; generator change  . LEG SKIN LESION  BIOPSY / EXCISION  05/16/2001   Lichen Planus  . VENTRAL HERNIA REPAIR  1989; 04/15/1998     OB History   No obstetric history on file.     Family History  Problem Relation Age of Onset  . Stroke Father        died from CAD?  Marland Kitchen Heart disease Father   . Cancer Mother   . Diabetes Son        Type 1  . Sleep apnea Son   . Cancer Sister        cervical  . Hypertension Sister   . Hypertension Brother   . Rheum arthritis Daughter        Also PGM, PGGM    Social History   Tobacco Use  . Smoking status: Former Smoker    Packs/day: 1.00    Years: 7.00    Pack years: 7.00    Types: Cigarettes    Quit date: 03/16/1969    Years since quitting: 51.7  . Smokeless tobacco: Never Used  Vaping Use  . Vaping Use: Never used  Substance Use Topics  . Alcohol use: No  . Drug use: No    Home Medications Prior to Admission medications   Medication Sig Start Date End Date Taking? Authorizing Provider  amiodarone  (PACERONE) 200 MG tablet Take 0.5 tablets (100 mg total) by mouth daily. 07/18/20   Dollene Cleveland, DO  cholecalciferol (VITAMIN D) 25 MCG (1000 UNIT) tablet TAKE 1 TABLET BY MOUTH ONCE DAILY. 01/26/20   Latrelle Dodrill, MD  edoxaban (SAVAYSA) 60 MG TABS tablet Take 60 mg by mouth daily. 01/31/20   [name], [name], MD  fluticasone (FLONASE) 50 MCG/ACT nasal spray Place 2 sprays into both nostrils daily as needed. For congestion. 07/28/17  Latrelle Dodrill, MD  losartan (COZAAR) 25 MG tablet Take 0.5 tablets (12.5 mg total) by mouth daily. 09/05/20   [name], [name], MD  omeprazole (PRILOSEC) 20 MG capsule TAKE (1) CAPSULE BY MOUTH ONCE DAILY. 07/26/20   Latrelle Dodrill, MD  PHENobarbital (LUMINAL) 64.8 MG tablet TAKE 1 TABLET BY MOUTH TWICE DAILY 07/26/20   Latrelle Dodrill, MD  potassium chloride (KLOR-CON) 10 MEQ tablet TAKE 4 TABLETS BY MOUTH THREE TIMES DAILY (MORNING, NOON, AND NIGHT) 08/04/20   Laurey Morale, MD  spironolactone (ALDACTONE) 25 MG tablet TAKE 1 TABLET BY MOUTH ONCE DAILY. 10/20/20   [name], [name], MD  torsemide (DEMADEX) 20 MG tablet Take 4 tablets (80 mg total) by mouth in the morning AND 3 tablets (60 mg total) every evening. 09/27/20   Allayne Butcher, PA-C    Allergies    Aspirin, Penicillins, Tape, Wool alcohol [lanolin], Amoxicillin, and Ampicillin  Review of Systems   Review of Systems  All other systems reviewed and are negative.   Physical Exam Updated Vital Signs BP 120/78   Pulse 66   Temp 97.7 F (36.5 C) (Oral)   Resp 16   SpO2 100%   Physical Exam Vitals and nursing note reviewed.  Constitutional:      General: She is not in acute distress.    Appearance: Normal appearance. She is not ill-appearing.  HENT:     Head: Normocephalic and atraumatic.  Pulmonary:     Effort: Pulmonary effort is normal.  Musculoskeletal:     Comments: Right wrist with swelling and slight deformity noted.  She is able to flex and  extend all fingers and motor and sensation are intact to all fingers.  Capillary refill is brisk.  Skin:    General: Skin is warm and dry.  Neurological:     Mental Status: She is alert.     ED Results / Procedures / Treatments   Labs (all labs ordered are listed, but only abnormal results are displayed) Labs Reviewed - No data to display  EKG None  Radiology DG Wrist Complete Right  Result Date: 11/22/2020 CLINICAL DATA:  69 year old female status post fall with pain and swelling. EXAM: RIGHT WRIST - COMPLETE 3+ VIEW COMPARISON:  Right wrist series 02/07/2008. FINDINGS: Comminuted and impacted fractures of the distal right radius and right ulna metadiaphysis. Underlying chronic ulnar styloid fracture, also with acute avulsion at the tip. Mild dorsal angulation at both fracture sites. Comminution of the radius such that the radiocarpal joint is directly involved. Carpal bone alignment maintained. No acute carpal bone fracture identified. Proximal metacarpals appear intact. IMPRESSION: 1. Comminuted and dorsally impacted fractures of the distal right radius and ulna metadiaphysis. Radiocarpal joint and DRU involvement. 2. Acute on chronic ulnar styloid fracture. Electronically Signed   By: Odessa Fleming M.D.   On: 11/22/2020 19:12    Procedures Procedures (including critical care time)  Medications Ordered in ED Medications - No data to display  ED Course  I have reviewed the triage vital signs and the nursing notes.  Pertinent labs & imaging results that were available during my care of the patient were reviewed by me and considered in my medical decision making (see chart for details).    MDM Rules/Calculators/A&P  X-rays positive for fractures of the distal ulna and radius.  Patient will be placed in a splint and is to follow-up with hand surgery in the next 2 days.  Final Clinical Impression(s) / ED Diagnoses Final  diagnoses:  None    Rx / DC Orders ED Discharge Orders     None       Geoffery Lyons, MD 11/22/20 Ernestina Columbia

## 2020-11-22 NOTE — ED Triage Notes (Signed)
Pt states that she fell today and landed on her R arm, hearing a pop. Wrist swelling. Alert and oriented. Denies head trauma.

## 2020-11-22 NOTE — Discharge Instructions (Addendum)
Wear wrist splint until followed up by orthopedics.  Ice for 20 minutes every 2 hours while awake for the next 2 days.  Keep your arm elevated as much as possible.  Follow-up with Dr. Melvyn Novas in the orthopedic clinic in the next 1 to 2 days.  His contact information has been provided in this discharge summary for you to call and make these arrangements.  You can take hydrocodone as prescribed tonight as needed for pain.

## 2020-11-22 NOTE — Progress Notes (Signed)
Orthopedic Tech Progress Note Patient Details:  Misty Gray 1951/10/15 [id number]  Ortho Devices Type of Ortho Device: Ace wrap, Sugartong splint Ortho Device/Splint Location: applied sugartong splint and sling to RUE Ortho Device/Splint Interventions: Ordered, Application   Post Interventions Patient Tolerated: Well Instructions Provided: Care of device   Jennye Moccasin 11/22/2020, 7:39 PM

## 2020-11-24 DIAGNOSIS — M25531 Pain in right wrist: Secondary | ICD-10-CM | POA: Diagnosis not present

## 2020-11-28 DIAGNOSIS — M25531 Pain in right wrist: Secondary | ICD-10-CM | POA: Diagnosis not present

## 2020-11-29 ENCOUNTER — Other Ambulatory Visit (HOSPITAL_COMMUNITY): Payer: Self-pay

## 2020-11-29 NOTE — Progress Notes (Signed)
Paramedicine Encounter    Patient ID: Misty Gray, female    DOB: Jun 03, 1951, 69 y.o.   MRN: [id number]   Patient Care Team: Latrelle Dodrill, MD as PCP - General (Family Medicine) [name], MD (Cardiology)  Patient Active Problem List   Diagnosis Date Noted  . Hand injury, left, initial encounter 09/22/2020  . CHF exacerbation (HCC) 07/12/2020  . Hospital discharge follow-up 11/17/2019  . Onychomycosis 11/17/2019  . Sepsis due to pneumonia (HCC) 11/08/2019  . Abdominal wall hernia 11/05/2017  . Anticoagulated 11/05/2017  . Rash and nonspecific skin eruption 09/15/2017  . Ventral hernia without obstruction or gangrene 08/15/2017  . Skin irritation 07/23/2017  . Chronic venous stasis dermatitis 12/26/2016  . Urinary tract infection without hematuria   . Frequent PVCs   . Hypokalemia   . Housing problems 11/23/2016  . Chronic atrial fibrillation (HCC) 11/01/2016  . Hypotension 10/31/2016  . Special screening for malignant neoplasms, colon 08/12/2016  . Heme positive stool 08/09/2016  . Intertrigo 08/09/2016  . Chronic anticoagulation 04/26/2016  . Chronic venous insufficiency   . Preventative health care 10/05/2014  . Breast mass, left 10/05/2014  . Osteopenia 09/15/2014  . Hypertension 01/04/2014  . Chronic systolic dysfunction of left ventricle 05/05/2013  . At high risk for falls 02/11/2013  . Vitamin D deficiency 07/15/2012  . Sinoatrial node dysfunction (HCC) 05/08/2011  . Pacemaker 08/10/2009  . Allergic rhinitis 05/31/2008  . Morbid obesity (HCC) 02/12/2007  . GASTROESOPHAGEAL REFLUX, NO ESOPHAGITIS 02/12/2007  . Female stress incontinence 02/12/2007  . Seizure disorder (HCC) 02/12/2007  . OSA (obstructive sleep apnea) 02/12/2007    Current Outpatient Medications:  .  amiodarone (PACERONE) 200 MG tablet, Take 0.5 tablets (100 mg total) by mouth daily., Disp: 45 tablet, Rfl: 3 .  cholecalciferol (VITAMIN D) 25 MCG (1000 UNIT) tablet, TAKE 1 TABLET BY  MOUTH ONCE DAILY., Disp: 28 tablet, Rfl: 11 .  edoxaban (SAVAYSA) 60 MG TABS tablet, Take 60 mg by mouth daily., Disp: 30 tablet, Rfl: 11 .  fluticasone (FLONASE) 50 MCG/ACT nasal spray, Place 2 sprays into both nostrils daily as needed. For congestion., Disp: 16 g, Rfl: 1 .  HYDROcodone-acetaminophen (NORCO) 5-325 MG tablet, Take 1-2 tablets by mouth every 6 (six) hours as needed., Disp: 15 tablet, Rfl: 0 .  losartan (COZAAR) 25 MG tablet, Take 0.5 tablets (12.5 mg total) by mouth daily., Disp: 15 tablet, Rfl: 11 .  omeprazole (PRILOSEC) 20 MG capsule, TAKE (1) CAPSULE BY MOUTH ONCE DAILY., Disp: 28 capsule, Rfl: 5 .  PHENobarbital (LUMINAL) 64.8 MG tablet, TAKE 1 TABLET BY MOUTH TWICE DAILY, Disp: 56 tablet, Rfl: 5 .  potassium chloride (KLOR-CON) 10 MEQ tablet, TAKE 4 TABLETS BY MOUTH THREE TIMES DAILY (MORNING, NOON, AND NIGHT), Disp: 336 tablet, Rfl: 11 .  spironolactone (ALDACTONE) 25 MG tablet, TAKE 1 TABLET BY MOUTH ONCE DAILY., Disp: 28 tablet, Rfl: 0 .  torsemide (DEMADEX) 20 MG tablet, Take 4 tablets (80 mg total) by mouth in the morning AND 3 tablets (60 mg total) every evening., Disp: 210 tablet, Rfl: 3 Allergies  Allergen Reactions  . Aspirin Hives and Rash  . Penicillins Rash and Other (See Comments)    Has patient had a PCN reaction causing immediate rash, facial/tongue/throat swelling, SOB or lightheadedness with hypotension: Yes Has patient had a PCN reaction causing severe rash involving mucus membranes or skin necrosis: No Has patient had a PCN reaction that required hospitalization No Has patient had a PCN reaction occurring within the  last 10 years: Yes If all of the above answers are "NO", then may proceed with Cephalosporin use.  Patient has tolerated cephalosporins in 2017.  . Tape Other (See Comments)    NO PAPER TAPE-MUST BE MEDICAL TAPE - unknown reaction NO PAPER TAPE-MUST BE MEDICAL TAPE - unknown reaction  . Wool Alcohol [Lanolin] Hives  . Amoxicillin Rash     Patient has tolerated cephalosporins  . Ampicillin Itching and Rash    Patient has tolerated cephalosporins      Social History   Socioeconomic History  . Marital status: Married    Spouse name: [name]  . Number of children: 4  . Years of education: 69  . Highest education level: Not on file  Occupational History  . Occupation: disabled  Tobacco Use  . Smoking status: Former Smoker    Packs/day: 1.00    Years: 7.00    Pack years: 7.00    Types: Cigarettes    Quit date: 03/16/1969    Years since quitting: 51.7  . Smokeless tobacco: Never Used  Vaping Use  . Vaping Use: Never used  Substance and Sexual Activity  . Alcohol use: No  . Drug use: No  . Sexual activity: Not Currently    Birth control/protection: Post-menopausal  Other Topics Concern  . Not on file  Social History Narrative   Lives with husband [name] lives with them as do his 2 children   Daughter, [name], and her 3 children live in [place] -  [name] is incarcerated   Nephew, Florene Brill lives with them   Daughter, [name], in [name] [address]   Moved June 2013 to a better home on [facility].      Lives with husband, son, grandson, granddaughter, nephew.   Social Determinants of Health   Financial Resource Strain: Medium Risk  . Difficulty of Paying Living Expenses: Somewhat hard  Food Insecurity: No Food Insecurity  . Worried About Programme researcher, broadcasting/film/video in the Last Year: Never true  . Ran Out of Food in the Last Year: Never true  Transportation Needs: No Transportation Needs  . Lack of Transportation (Medical): No  . Lack of Transportation (Non-Medical): No  Physical Activity: Not on file  Stress: Not on file  Social Connections: Not on file  Intimate Partner Violence: Not on file    Physical Exam Cardiovascular:     Rate and Rhythm: Normal rate and regular rhythm.  Pulmonary:     Effort: Pulmonary effort is normal.      Breath sounds: Normal breath sounds.  Musculoskeletal:        General: Normal range of motion.     Right lower leg: Edema present.     Left lower leg: Edema present.  Skin:    General: Skin is warm and dry.     Capillary Refill: Capillary refill takes less than 2 seconds.  Neurological:     Mental Status: She is alert and oriented to person, place, and time.  Psychiatric:        Mood and Affect: Mood normal.         Future Appointments  Date Time Provider Department Center  12/04/2020  7:55 AM [facility] DEVICE REMOTES [name]  12/12/2020  2:45 PM Freddie Breech, DPM [facility]  01/04/2021  7:55 AM [facility] DEVICE REMOTES [name]  01/30/2021  1:30 PM [facility] PA/NP [facility] None  02/05/2021  7:55  AM [facility] DEVICE REMOTES [name]  03/08/2021  7:55 AM [facility] DEVICE REMOTES [name]  04/09/2021  7:55 AM [facility] DEVICE REMOTES [name]  05/10/2021  7:55 AM [facility] DEVICE REMOTES [name]  06/11/2021  7:55 AM [facility] DEVICE REMOTES [name]    BP 110/81 (BP Location: Left Arm, Patient Position: Sitting, Cuff Size: Large)   Pulse 80   Resp 18   Wt 179 lb 12.8 oz (81.6 kg)   SpO2 98%   BMI 38.91 kg/m   Weight yesterday- 181.2 lb Last visit weight- 171 lb  Misty Gray was seen at home today and reported feeling generally well. She denied chest pain, SOB, headache, dizziness, orthopnea, fever or cough over the past week. She reported being compliant with her medications over the past week however her weight has increased. She denied any dietary changes but stated she threw some of her medications in the trash from last week so it is unclear if she had taken all of her medications. When questioned about how she takes them she was able to follow the orders on her pill packs so it remains unclear what she threw in the trash. She just received a  new shipment of her medications and they were checked for accuracy. I will follow up in two weeks.   Jacqualine Code, EMT 11/29/20  ACTION: Home visit completed Next visit planned for 2 weeks

## 2020-11-30 DIAGNOSIS — G4733 Obstructive sleep apnea (adult) (pediatric): Secondary | ICD-10-CM | POA: Diagnosis not present

## 2020-12-01 ENCOUNTER — Other Ambulatory Visit (HOSPITAL_COMMUNITY): Payer: Self-pay

## 2020-12-01 MED ORDER — LOSARTAN POTASSIUM 25 MG PO TABS
12.5000 mg | ORAL_TABLET | Freq: Every day | ORAL | 11 refills | Status: DC
Start: 2020-12-01 — End: 2020-12-29

## 2020-12-01 MED ORDER — AMIODARONE HCL 200 MG PO TABS
100.0000 mg | ORAL_TABLET | Freq: Every day | ORAL | 3 refills | Status: DC
Start: 2020-12-01 — End: 2021-02-22

## 2020-12-01 MED ORDER — TORSEMIDE 20 MG PO TABS: ORAL_TABLET | ORAL | 3 refills | Status: DC

## 2020-12-01 MED ORDER — OMEPRAZOLE 20 MG PO CPDR
DELAYED_RELEASE_CAPSULE | ORAL | 5 refills | Status: DC
Start: 2020-12-01 — End: 2020-12-07

## 2020-12-01 MED ORDER — SPIRONOLACTONE 25 MG PO TABS
25.0000 mg | ORAL_TABLET | Freq: Every day | ORAL | 0 refills | Status: DC
Start: 2020-12-01 — End: 2020-12-29

## 2020-12-06 ENCOUNTER — Other Ambulatory Visit (HOSPITAL_COMMUNITY): Payer: Self-pay | Admitting: Internal Medicine

## 2020-12-06 ENCOUNTER — Other Ambulatory Visit: Payer: Self-pay | Admitting: Family Medicine

## 2020-12-12 ENCOUNTER — Other Ambulatory Visit: Payer: Self-pay

## 2020-12-12 ENCOUNTER — Encounter: Payer: Self-pay | Admitting: Podiatry

## 2020-12-12 ENCOUNTER — Ambulatory Visit: Payer: Medicare HMO | Admitting: Podiatry

## 2020-12-12 ENCOUNTER — Telehealth: Payer: Self-pay | Admitting: Family Medicine

## 2020-12-12 DIAGNOSIS — M79674 Pain in right toe(s): Secondary | ICD-10-CM

## 2020-12-12 DIAGNOSIS — B351 Tinea unguium: Secondary | ICD-10-CM

## 2020-12-12 DIAGNOSIS — M79675 Pain in left toe(s): Secondary | ICD-10-CM

## 2020-12-12 DIAGNOSIS — Q828 Other specified congenital malformations of skin: Secondary | ICD-10-CM

## 2020-12-12 DIAGNOSIS — I739 Peripheral vascular disease, unspecified: Secondary | ICD-10-CM | POA: Diagnosis not present

## 2020-12-12 DIAGNOSIS — L84 Corns and callosities: Secondary | ICD-10-CM | POA: Diagnosis not present

## 2020-12-12 NOTE — Telephone Encounter (Signed)
Attempted to reach patient since she is behind on cologuard as well as follow up with me. No answer. Did not leave VM. Will send letter.  Latrelle Dodrill, MD

## 2020-12-15 ENCOUNTER — Encounter: Payer: Self-pay | Admitting: Podiatry

## 2020-12-15 NOTE — Progress Notes (Signed)
Subjective: Misty Gray is a pleasant 69 y.o. female patient seen today for at risk foot care. Patient has h/o PAD and corn(s) left foot , callus(es) b/l feet and painful mycotic nails.  Pain interferes with ambulation. Aggravating factors include wearing enclosed shoe gear. Painful toenails interfere with ambulation. Aggravating factors include wearing enclosed shoe gear. Pain is relieved with periodic professional debridement. Painful corns and calluses are aggravated when weightbearing with and without shoegear. Pain is relieved with periodic professional debridement.   She voices no new pedal concerns on today's visit.  Past Medical History:  Diagnosis Date  . Abnormal CT of the head 12/16/1984   R parietal atrophy  . Allergic rhinitis, cause unspecified   . Altered mental status 11/28/2016  . Anemia   . Arthritis    "hands and knees" (09/06/2015)  . Atrial fibrillation (HCC)   . Cellulitis 09/07/2015  . Cellulitis and abscess of leg 09/2016   bilateral  . CHF (congestive heart failure) (HCC)   . Diaphragmatic hernia without mention of obstruction or gangrene   . Dyspnea   . Exertional dyspnea 06/22/12  . Family history of adverse reaction to anesthesia    "daughter fights w/them; just can't relax during OR; stay awake during the OR"  . Fatigue 06/22/2012  . Female stress incontinence   . GERD (gastroesophageal reflux disease)   . Grand mal seizure (HCC)   . H/O hiatal hernia    two removed  . Heart murmur   . High cholesterol   . History of blood transfusion 1956   "related to nose bleed"  . Hypertension   . Influenza A 01/11/2014  . Lichenification and lichen simplex chronicus   . Migraine    "when I was a teenager"  . Morbid obesity (HCC)   . Nonischemic cardiomyopathy (HCC)    EF has normalized-repeat Pending   . On home oxygen therapy    "2L when I'm asleep in bed" (09/06/2015)  . OSA on CPAP   . Pacemaker  st judes    Patient states "this is my third pacemaker"  .  Pneumonia 2004; 2011; 11/2011  . Seizures (HCC) since 1981   "can hear you talking but sounds like you are in big tunnel; have them often if not taking RX; last one was 06/17/12" (09/06/2015)  . Shortness of breath 10/24/2010   Qualifier: Diagnosis of  By: [place], Bonnie    . Sick sinus syndrome (HCC)    WITH PRIOR DDD PACEMAKER IMPLANTATION  . Somnolence   . Stroke [facility]) 1988   "mouth drawed real bad on my left side; found out it was a seizure"  . Syncope and collapse 06/22/12   "hit forehead and left knee"; denies loss of consciousness  . Unspecified venous (peripheral) insufficiency     Patient Active Problem List   Diagnosis Date Noted  . Hand injury, left, initial encounter 09/22/2020  . CHF exacerbation (HCC) 07/12/2020  . Hospital discharge follow-up 11/17/2019  . Onychomycosis 11/17/2019  . Sepsis due to pneumonia (HCC) 11/08/2019  . Abdominal wall hernia 11/05/2017  . Anticoagulated 11/05/2017  . Rash and nonspecific skin eruption 09/15/2017  . Ventral hernia without obstruction or gangrene 08/15/2017  . Skin irritation 07/23/2017  . Chronic venous stasis dermatitis 12/26/2016  . Urinary tract infection without hematuria   . Frequent PVCs   . Hypokalemia   . Housing problems 11/23/2016  . Chronic atrial fibrillation (HCC) 11/01/2016  . Hypotension 10/31/2016  . Special screening for malignant neoplasms,  colon 08/12/2016  . Heme positive stool 08/09/2016  . Intertrigo 08/09/2016  . Chronic anticoagulation 04/26/2016  . Chronic venous insufficiency   . Preventative health care 10/05/2014  . Breast mass, left 10/05/2014  . Osteopenia 09/15/2014  . Hypertension 01/04/2014  . Chronic systolic dysfunction of left ventricle 05/05/2013  . At high risk for falls 02/11/2013  . Vitamin D deficiency 07/15/2012  . Sinoatrial node dysfunction (HCC) 05/08/2011  . Pacemaker 08/10/2009  . Allergic rhinitis 05/31/2008  . Morbid obesity (HCC) 02/12/2007  . GASTROESOPHAGEAL REFLUX,  NO ESOPHAGITIS 02/12/2007  . Female stress incontinence 02/12/2007  . Seizure disorder (HCC) 02/12/2007  . OSA (obstructive sleep apnea) 02/12/2007    Current Outpatient Medications on File Prior to Visit  Medication Sig Dispense Refill  . amiodarone (PACERONE) 200 MG tablet Take 0.5 tablets (100 mg total) by mouth daily. 45 tablet 3  . cholecalciferol (VITAMIN D) 25 MCG (1000 UNIT) tablet TAKE 1 TABLET BY MOUTH ONCE DAILY. 28 tablet 11  . fluticasone (FLONASE) 50 MCG/ACT nasal spray Place 2 sprays into both nostrils daily as needed. For congestion. 16 g 1  . HYDROcodone-acetaminophen (NORCO) 5-325 MG tablet Take 1-2 tablets by mouth every 6 (six) hours as needed. 15 tablet 0  . losartan (COZAAR) 25 MG tablet Take 0.5 tablets (12.5 mg total) by mouth daily. 15 tablet 11  . omeprazole (PRILOSEC) 20 MG capsule TAKE (1) CAPSULE BY MOUTH ONCE DAILY. 28 capsule 11  . PHENobarbital (LUMINAL) 64.8 MG tablet TAKE 1 TABLET BY MOUTH TWICE DAILY 56 tablet 0  . potassium chloride (KLOR-CON) 10 MEQ tablet TAKE 4 TABLETS BY MOUTH THREE TIMES DAILY (MORNING, NOON, AND NIGHT) 336 tablet 11  . SAVAYSA 60 MG TABS tablet TAKE 1 TABLET BY MOUTH ONCE DAILY. 30 tablet 11  . spironolactone (ALDACTONE) 25 MG tablet Take 1 tablet (25 mg total) by mouth daily. 28 tablet 0  . torsemide (DEMADEX) 20 MG tablet Take 4 tablets (80 mg total) by mouth in the morning AND 3 tablets (60 mg total) every evening. 210 tablet 3   No current facility-administered medications on file prior to visit.    Allergies  Allergen Reactions  . Aspirin Hives and Rash  . Penicillins Rash and Other (See Comments)    Has patient had a PCN reaction causing immediate rash, facial/tongue/throat swelling, SOB or lightheadedness with hypotension: Yes Has patient had a PCN reaction causing severe rash involving mucus membranes or skin necrosis: No Has patient had a PCN reaction that required hospitalization No Has patient had a PCN reaction  occurring within the last 10 years: Yes If all of the above answers are "NO", then may proceed with Cephalosporin use.  Patient has tolerated cephalosporins in 2017.  . Tape Other (See Comments)    NO PAPER TAPE-MUST BE MEDICAL TAPE - unknown reaction NO PAPER TAPE-MUST BE MEDICAL TAPE - unknown reaction  . Wool Alcohol [Lanolin] Hives  . Amoxicillin Rash    Patient has tolerated cephalosporins  . Ampicillin Itching and Rash    Patient has tolerated cephalosporins    Objective: Physical Exam  General: DIANELLY FERRAN is a pleasant 69 y.o. Caucasian female, in NAD. AAO x 3.   Vascular:  Neurovascular status unchanged b/l lower extremities. Capillary refill time to digits immediate b/l. Faintly palpable pedal pulses b/l. Pedal hair absent. Lower extremity skin temperature gradient warm to cool. Dependent rubor noted b/l lower extremities. +1 pitting edema b/l lower extremities. Varicosities present b/l.  Dermatological:  Pedal skin  is thin shiny, atrophic b/l lower extremities, L 3rd toe and R hallux. No open wounds bilaterally. No interdigital macerations bilaterally. Toenails 1-5 b/l elongated, discolored, dystrophic, thickened, crumbly with subungual debris and tenderness to dorsal palpation. Hyperkeratotic lesion(s) L hallux, L 3rd toe and R hallux.  No erythema, no edema, no drainage, no flocculence. Porokeratotic lesion(s) submet head 1 right foot and submet head 3 left foot. No erythema, no edema, no drainage, no flocculence.  Musculoskeletal:  Normal muscle strength 5/5 to all lower extremity muscle groups bilaterally. No pain crepitus or joint limitation noted with ROM b/l. Hammertoes noted to the L 2nd toe and R 2nd toe.  Neurological:  Protective sensation decreased with 10 gram monofilament b/l. Vibratory sensation decreased b/l. Proprioception intact bilaterally. Clonus negative b/l.  Assessment and Plan:  1. Pain due to onychomycosis of toenails of both feet   2. Corns and  callosities   3. Porokeratosis   4. PVD (peripheral vascular disease) (HCC)    -Examined patient. -No new findings. No new orders. -Toenails 1-5 b/l were debrided in length and girth with sterile nail nippers and dremel without iatrogenic bleeding.  -Corn(s) L 3rd toe and callus(es) L hallux and R hallux were pared utilizing sterile scalpel blade without incident. Total number debrided =3. -Painful porokeratotic lesion(s) submet head 1 right foot and submet head 3 left foot pared and enucleated with sterile scalpel blade without incident. -Patient to report any pedal injuries to medical professional immediately. -Patient to continue soft, supportive shoe gear daily. -Patient/POA to call should there be question/concern in the interim.  Return in about 3 months (around 03/12/2021).  Freddie Breech, DPM

## 2020-12-16 DIAGNOSIS — G4733 Obstructive sleep apnea (adult) (pediatric): Secondary | ICD-10-CM | POA: Diagnosis not present

## 2020-12-16 DIAGNOSIS — R0902 Hypoxemia: Secondary | ICD-10-CM | POA: Diagnosis not present

## 2020-12-19 DIAGNOSIS — M25531 Pain in right wrist: Secondary | ICD-10-CM | POA: Diagnosis not present

## 2020-12-29 ENCOUNTER — Telehealth: Payer: Self-pay | Admitting: Internal Medicine

## 2020-12-29 MED ORDER — SPIRONOLACTONE 25 MG PO TABS
25.0000 mg | ORAL_TABLET | Freq: Every day | ORAL | 6 refills | Status: DC
Start: 2020-12-29 — End: 2021-07-18

## 2020-12-29 MED ORDER — TORSEMIDE 20 MG PO TABS
ORAL_TABLET | ORAL | 6 refills | Status: DC
Start: 2020-12-29 — End: 2021-07-18

## 2020-12-29 MED ORDER — POTASSIUM CHLORIDE CRYS ER 10 MEQ PO TBCR: EXTENDED_RELEASE_TABLET | ORAL | 6 refills | Status: DC

## 2020-12-29 MED ORDER — LOSARTAN POTASSIUM 25 MG PO TABS
12.5000 mg | ORAL_TABLET | Freq: Every day | ORAL | 6 refills | Status: DC
Start: 2020-12-29 — End: 2021-03-13

## 2020-12-29 NOTE — Telephone Encounter (Signed)
This is a CHF pt 

## 2020-12-29 NOTE — Telephone Encounter (Signed)
rx's sent in, pt aware

## 2020-12-29 NOTE — Telephone Encounter (Signed)
°*  STAT* If patient is at the pharmacy, call can be transferred to refill team.   1. Which medications need to be refilled? (please list name of each medication and dose if known) spironolactone (ALDACTONE) 25 MG tablet potassium chloride (KLOR-CON) 10 MEQ tablet torsemide (DEMADEX) 20 MG tablet losartan (COZAAR) 25 MG tablet   2. Which pharmacy/location (including street and city if local pharmacy) is medication to be sent to? [id number] - [place], [place] - [address] AT [facility] [address]  3. Do they need a 30 day or 90 day supply?  30 day   Patient will run out of medication this weekend

## 2021-01-11 NOTE — Progress Notes (Signed)
Shoes were ordered but the pcp did not sign because pt is not diabetic, so pt never received shoes.

## 2021-01-12 NOTE — Progress Notes (Signed)
Pt tried to get shoes as well but the doctor sent back paperwork stating pt is not a diabetic. Pts husband did get diabetic shoes and inserts in November

## 2021-01-16 DIAGNOSIS — R0902 Hypoxemia: Secondary | ICD-10-CM | POA: Diagnosis not present

## 2021-01-16 DIAGNOSIS — G4733 Obstructive sleep apnea (adult) (pediatric): Secondary | ICD-10-CM | POA: Diagnosis not present

## 2021-01-29 ENCOUNTER — Telehealth (HOSPITAL_COMMUNITY): Payer: Self-pay

## 2021-01-29 NOTE — Progress Notes (Signed)
[facility]  Heart Failure Date:  01/30/2021   ID:  Misty Gray, DOB [birth year], MRN [id number]  Location: Home  Provider location:  [facility] Type of Visit: Established patient  PCP:  Latrelle Dodrill, MD- Internal Medicine   Cardiologist:  No primary care provider on file. Primary HF: Dr. Gala Romney EP: Dr Johney Frame   Chief Complaint: F/u for Chronic Systolic Heart Failure    History of Present Illness: [name] is a 70 y/o woman with history of morbid obesity, OSA, chronic atrial fibrillation with tachy-brady syndrome s/p PPM, frequent PVCs, systolic HF due to NICM.  Admitted for ADHF in 10/17. Cath with normal coronary arteries and elevated filling pressures. Echo 10/17 EF 30-35% (previously 35-40%). Felt to have possible PVC or RV pacing cardiomyopathy. (She has 81% RV pacing and 15-20 PVCs per minute). Started on po amio with suppression of PVCs. Diuresed 41 pounds. Discharge weight 197.   Admitted 12/14 through 12/06/2016 with marked volume overload. Discharge weight was 195 pounds.Transitioned to torsemide 40 mg in am and 20 mg in pm.   Echo 07/2017 LVEF 25-30%, Trivial AI, Mod MR, Severe LAE, Mild RV dilation, Mild RAE, PA peak pressure 31 mm Hg.  Echo 1/20 EF 25-30% (no improvement with PVC suppression)  Admitted 07/2020 with A/C systolic heart failure. Volume overloaded. Diuresed with IV lasix and transitioned to torsemide 60 mg twice a day. RV pacing 28% of the time by device interrogation.  Very limited GDMT due to inability to tolerate HF meds.  Excellent diuresis w/ IV Lasix, overall down 21 lb. Decreased backup pacing rate to 50 bpm from 70 bpm. She is unable to perform remote device transmissions because of her housing situation. Discharge date 07/18/2020.  She returned 11/21 for HF follow up with son. Rarely leaves the house. SOB with exertion. Denies  Weight at home 181 pounds. Says she has hard time weighing.  All medications are now in a bubble pack. Following by HF Paramedicine.   Today she returns for HF follow up with her son. Overall feeling fine. SOB with walking fast or walking up stairs/incline. Denies increasing SOB, CP, dizziness, or PND/Orthopnea. Appetite ok. No fever or chills. Does not check weight at home. Taking all medications. Drinking >2L fluids/day, son does most of the cooking at home. Took extra torsemide 20 mg on Friday for swelling.   Studies: ECHO 06/2020  EF 25-30% RV moderately reduced and enlarged.   R/LHC 10/03/16 Ao = 94/58 (72) LV = 104/20 RA = 21 RV = 65/4/19 PA = 65/21 (38) PCW = 23 Fick cardiac output/index = 6.1/2.9 PVR = 2.5 WU FA sat = 97%  PA sat = 69%, 70% Assessment: 1. Normal coronary arteries 2. NICM with EF 30-35% by echo 3. Persistently elevated biventricular pressures   Past Medical History:  Diagnosis Date  . Abnormal CT of the head 12/16/1984   R parietal atrophy  . Allergic rhinitis, cause unspecified   . Altered mental status 11/28/2016  . Anemia   . Arthritis    "hands and knees" (09/06/2015)  . Atrial fibrillation (HCC)   . Cellulitis 09/07/2015  . Cellulitis and abscess of leg 09/2016   bilateral  . CHF (congestive heart failure) (HCC)   . Diaphragmatic hernia without mention of obstruction or gangrene   . Dyspnea   . Exertional dyspnea 06/22/12  . Family history of adverse reaction to anesthesia    "daughter fights w/them; just can't  relax during OR; stay awake during the OR"  . Fatigue 06/22/2012  . Female stress incontinence   . GERD (gastroesophageal reflux disease)   . Grand mal seizure (HCC)   . H/O hiatal hernia    two removed  . Heart murmur   . High cholesterol   . History of blood transfusion 1956   "related to nose bleed"  . Hypertension   . Influenza A 01/11/2014  . Lichenification and lichen simplex chronicus   . Migraine    "when I was a teenager"  . Morbid obesity (HCC)   . Nonischemic cardiomyopathy (HCC)     EF has normalized-repeat Pending   . On home oxygen therapy    "2L when I'm asleep in bed" (09/06/2015)  . OSA on CPAP   . Pacemaker  st judes    Patient states "this is my third pacemaker"  . Pneumonia 2004; 2011; 11/2011  . Seizures (HCC) since 1981   "can hear you talking but sounds like you are in big tunnel; have them often if not taking RX; last one was 06/17/12" (09/06/2015)  . Shortness of breath 10/24/2010   Qualifier: Diagnosis of  By: [place], Bonnie    . Sick sinus syndrome (HCC)    WITH PRIOR DDD PACEMAKER IMPLANTATION  . Somnolence   . Stroke [facility]) 1988   "mouth drawed real bad on my left side; found out it was a seizure"  . Syncope and collapse 06/22/12   "hit forehead and left knee"; denies loss of consciousness  . Unspecified venous (peripheral) insufficiency    Past Surgical History:  Procedure Laterality Date  . APPENDECTOMY    . CARDIAC CATHETERIZATION N/A 10/03/2016   Procedure: Right/Left Heart Cath and Coronary Angiography;  Surgeon: Dolores Patty, MD;  Location: [facility] INVASIVE CV LAB;  Service: Cardiovascular;  Laterality: N/A;  . HERNIA REPAIR  ? 1988; 04/15/1998  . INSERT / REPLACE / REMOVE PACEMAKER  12/16/1990   DDD EF 30%;  Marland Kitchen INSERT / REPLACE / REMOVE PACEMAKER  07/25/2003   replaced by BB, leads are both IS1 and from 1992  . INSERT / REPLACE / REMOVE PACEMAKER  05/21/2011   JA; generator change  . LEG SKIN LESION  BIOPSY / EXCISION  05/16/2001   Lichen Planus  . VENTRAL HERNIA REPAIR  1989; 04/15/1998    Current Outpatient Medications  Medication Sig Dispense Refill  . amiodarone (PACERONE) 200 MG tablet Take 0.5 tablets (100 mg total) by mouth daily. 45 tablet 3  . cholecalciferol (VITAMIN D) 25 MCG (1000 UNIT) tablet TAKE 1 TABLET BY MOUTH ONCE DAILY. 28 tablet 11  . fluticasone (FLONASE) 50 MCG/ACT nasal spray Place 2 sprays into both nostrils daily as needed. For congestion. 16 g 1  . HYDROcodone-acetaminophen (NORCO) 5-325 MG tablet Take 1-2 tablets by  mouth every 6 (six) hours as needed. 15 tablet 0  . losartan (COZAAR) 25 MG tablet Take 0.5 tablets (12.5 mg total) by mouth daily. 15 tablet 6  . omeprazole (PRILOSEC) 20 MG capsule TAKE (1) CAPSULE BY MOUTH ONCE DAILY. 28 capsule 11  . PHENobarbital (LUMINAL) 64.8 MG tablet TAKE 1 TABLET BY MOUTH TWICE DAILY 56 tablet 0  . potassium chloride (KLOR-CON) 10 MEQ tablet TAKE 4 TABLETS BY MOUTH THREE TIMES DAILY (MORNING, NOON, AND NIGHT) 360 tablet 6  . SAVAYSA 60 MG TABS tablet TAKE 1 TABLET BY MOUTH ONCE DAILY. 30 tablet 11  . spironolactone (ALDACTONE) 25 MG tablet Take 1 tablet (25 mg total) by  mouth daily. 30 tablet 6  . torsemide (DEMADEX) 20 MG tablet Take 4 tablets (80 mg total) by mouth in the morning AND 3 tablets (60 mg total) every evening. 210 tablet 6   No current facility-administered medications for this encounter.    Allergies:   Aspirin, Penicillins, Tape, Wool alcohol [lanolin], Amoxicillin, and Ampicillin   Social History:  The patient  reports that she quit smoking about 51 years ago. Her smoking use included cigarettes. She has a 7.00 pack-year smoking history. She has never used smokeless tobacco. She reports that she does not drink alcohol and does not use drugs.   Family History:  The patient's family history includes Cancer in her mother and sister; Diabetes in her son; Heart disease in her father; Hypertension in her brother and sister; Rheum arthritis in her daughter; Sleep apnea in her son; Stroke in her father.   ROS:  Please see the history of present illness.   All other systems are personally reviewed and negative.  Vitals:   01/30/21 1323  BP: 125/86  Pulse: 88  SpO2: 99%   Wt Readings from Last 3 Encounters:  01/30/21 80.5 kg  11/29/20 81.6 kg  11/22/20 77.6 kg   ECG: afib, 68 bpm (personally reviewed) Reds Clip: 44%  PHYSICAL EXAM: General:  NAD. No resp difficulty. Elderly HEENT: Normal Neck: Supple. JVP 6-7, HJR present. Carotids 2+ bilat; no  bruits. No lymphadenopathy or thryomegaly appreciated. Cor: PMI nondisplaced. Irregular rate & rhythm. No rubs, gallops, murmurs Lungs: Clear Abdomen: Obese, soft, nontender, nondistended. No hepatosplenomegaly. No bruits or masses. Good bowel sounds. Extremities: No cyanosis, clubbing, rash, 1+ LE edema Neuro: alert & oriented x 3, cranial nerves grossly intact. Moves all 4 extremities w/o difficulty. Affect pleasant.   Recent Labs: 07/16/2020: Magnesium 2.0 09/05/2020: B Natriuretic Peptide 1,407.3 09/27/2020: ALT 14; BUN 24; Creatinine, Ser 0.95; Hemoglobin 12.5; Platelets ACLMP; Potassium 4.0; Sodium 137; TSH 1.175  Personally reviewed   Wt Readings from Last 3 Encounters:  01/30/21 80.5 kg  11/29/20 81.6 kg  11/22/20 77.6 kg    ASSESSMENT AND PLAN:  1. Chronic systolic HF due to NICM: ? PVC induced vs RV pacing. Echo 12/06/2016 EF 10-15%. Cath 10/17 normal coronary arteries.  Echo 02/2017 with LVEF 15-20% despite PVC suppression. Echo 07/2017 EF 25-30%.  Trivial AI, Mod MR, Severe LAE, Mild RV dilation, Mild RAE, PA peak pressure 31 mm Hg. Echo 1/20 EF 25-30%. Echo 06/2020 EF 25-30%, RV moderately reduced.  - NYHA III. Volume elevated today on exam and Reds 44%. Admits to drinking more fluids. Weight stable. - Increase torsemide to 80 mg bid x 3 days, then back to home dose of 80 mg qam and 60 mg qpm. - Continue spiro 25 mg daily   - Continue losartan 12.5 mg daily (did not tolerate higher dose previously due to orthostatic hypotension)  - Digoxin was stopped due to elevated level.    - Would not use SGLT2i given high risk of genital yeast infection - Intolerant to  blockers. - EP did not recommend ICD. Followed by Dr Johney Frame.  - Given a scale today and instructed to weigh daily; call if weight gain of 3 lbs in 24 hours or 5 lbs in a week. - CMET today; repeat BMET 7-10 days.  2. Frequent PVCs - Suppressed on ECG today. Continue amio 100 mg daily.   - TSH (10/21) stable.   - Needs  annual eye exams.  - CMET today.  3. Obesity  Body  mass index is 38.39 kg/m.  - Encouraged portion control  4. Chronic AF with tachy-brady s/p pacemaker - Intolerant to  blockers. Rate controlled.  - Continue amio 100 mg daily. - TSH stable 09/27/20. - Needs annual eye exam.   - Continue endoxaban. - Followed by Dr. Johney Frame   5. Symptomatic Bradycardia  - Has PPM followed by Dr Johney Frame.   Follow up in 3-4 months with Dr. Gala Romney.   Signed, Jacklynn Ganong, FNP  01/30/2021 1:26 PM  [facility] [address] [facility] [place] [address] [phone] (office) [phone] (fax)

## 2021-01-29 NOTE — Telephone Encounter (Signed)
I called Misty Gray to remind her of her appointment tomorrow. She stated she was aware and would have her son bring her. She stated she was having trouble getting her phenobarbital due to having a new insurance company and needing a prior authorization from the prescribing physician. She was also concerned about how her [name] is being packaged because she though it was supposed to have been decreased to a half tablet. Per the latest note she should still be on a full 60 mg tablet but I requested she bring one of her pill packs with her tomorrow so the staff could ensure she was on the correct medications. Lastly I asked that she reach out to her PCP's office to make them aware of the need for a PA on phenobarbital. She was understanding and agreeable.  Jacqualine Code, EMT 01/29/21

## 2021-01-30 ENCOUNTER — Encounter (HOSPITAL_COMMUNITY): Payer: Self-pay

## 2021-01-30 ENCOUNTER — Other Ambulatory Visit: Payer: Self-pay

## 2021-01-30 ENCOUNTER — Ambulatory Visit (HOSPITAL_COMMUNITY)
Admission: RE | Admit: 2021-01-30 | Discharge: 2021-01-30 | Disposition: A | Discharge status: Home / Self Care | Payer: Medicare Other | Source: Ambulatory Visit | Attending: Family Medicine | Admitting: Family Medicine

## 2021-01-30 VITALS — BP 125/86 | HR 88 | Wt 177.4 lb

## 2021-01-30 DIAGNOSIS — I495 Sick sinus syndrome: Secondary | ICD-10-CM | POA: Diagnosis not present

## 2021-01-30 DIAGNOSIS — G4733 Obstructive sleep apnea (adult) (pediatric): Secondary | ICD-10-CM | POA: Insufficient documentation

## 2021-01-30 DIAGNOSIS — Z7901 Long term (current) use of anticoagulants: Secondary | ICD-10-CM | POA: Insufficient documentation

## 2021-01-30 DIAGNOSIS — Z8249 Family history of ischemic heart disease and other diseases of the circulatory system: Secondary | ICD-10-CM | POA: Insufficient documentation

## 2021-01-30 DIAGNOSIS — I110 Hypertensive heart disease with heart failure: Secondary | ICD-10-CM | POA: Insufficient documentation

## 2021-01-30 DIAGNOSIS — M25531 Pain in right wrist: Secondary | ICD-10-CM | POA: Diagnosis not present

## 2021-01-30 DIAGNOSIS — Z79899 Other long term (current) drug therapy: Secondary | ICD-10-CM | POA: Diagnosis not present

## 2021-01-30 DIAGNOSIS — I493 Ventricular premature depolarization: Secondary | ICD-10-CM | POA: Diagnosis not present

## 2021-01-30 DIAGNOSIS — I482 Chronic atrial fibrillation, unspecified: Secondary | ICD-10-CM

## 2021-01-30 DIAGNOSIS — Z87891 Personal history of nicotine dependence: Secondary | ICD-10-CM | POA: Insufficient documentation

## 2021-01-30 DIAGNOSIS — Z950 Presence of cardiac pacemaker: Secondary | ICD-10-CM | POA: Diagnosis not present

## 2021-01-30 DIAGNOSIS — Z6838 Body mass index (BMI) 38.0-38.9, adult: Secondary | ICD-10-CM | POA: Diagnosis not present

## 2021-01-30 DIAGNOSIS — I5022 Chronic systolic (congestive) heart failure: Secondary | ICD-10-CM | POA: Diagnosis not present

## 2021-01-30 DIAGNOSIS — I519 Heart disease, unspecified: Secondary | ICD-10-CM

## 2021-01-30 DIAGNOSIS — R0602 Shortness of breath: Secondary | ICD-10-CM | POA: Insufficient documentation

## 2021-01-30 LAB — COMPREHENSIVE METABOLIC PANEL
ALT: 22 U/L (ref 0–44)
AST: 42 U/L — ABNORMAL HIGH (ref 15–41)
Albumin: 3.4 g/dL — ABNORMAL LOW (ref 3.5–5.0)
Alkaline Phosphatase: 106 U/L (ref 38–126)
Anion gap: 9 (ref 5–15)
BUN: 24 mg/dL — ABNORMAL HIGH (ref 8–23)
CO2: 25 mmol/L (ref 22–32)
Calcium: 9.6 mg/dL (ref 8.9–10.3)
Chloride: 106 mmol/L (ref 98–111)
Creatinine, Ser: 0.83 mg/dL (ref 0.44–1.00)
GFR, Estimated: >60 mL/min (ref >60)
Glucose, Bld: 95 mg/dL (ref 70–99)
Potassium: 4.1 mmol/L (ref 3.5–5.1)
Sodium: 140 mmol/L (ref 135–145)
Total Bilirubin: 0.9 mg/dL (ref 0.3–1.2)
Total Protein: 7.3 g/dL (ref 6.5–8.1)

## 2021-01-30 NOTE — Patient Instructions (Signed)
INCREASE Torsemide to 80 mg twice a day foe 3 days, the resume normal dose of 80 mg in the AM amd 60 mg in the PM   Labs today We will only contact you if something comes back abnormal or we need to make some changes. Otherwise no news is good news!  Labs needed in 7-10 days  Your physician recommends that you schedule a follow-up appointment in: 4 months with Dr Gala Romney  Do the following things EVERYDAY: 1) Weigh yourself in the morning before breakfast. Write it down and keep it in a log. 2) Take your medicines as prescribed 3) Eat low salt foods-Limit salt (sodium) to 2000 mg per day.  4) Stay as active as you can everyday 5) Limit all fluids for the day to less than 2 liters  At the [facility], you and your health needs are our priority. As part of our continuing mission to provide you with exceptional heart care, we have created designated Provider Care Teams. These Care Teams include your primary Cardiologist (physician) and Advanced Practice Providers (APPs- Physician Assistants and Nurse Practitioners) who all work together to provide you with the care you need, when you need it.   You may see any of the following providers on your designated Care Team at your next follow up: Marland Kitchen Dr Arvilla Meres . Dr Marca Ancona . Tonye Becket, NP . Robbie Lis, PA . Shanda Bumps [place],NP . Karle Plumber, PharmD   Please be sure to bring in all your medications bottles to every appointment.

## 2021-02-09 ENCOUNTER — Other Ambulatory Visit (HOSPITAL_COMMUNITY): Payer: Medicare Other

## 2021-02-13 DIAGNOSIS — R0902 Hypoxemia: Secondary | ICD-10-CM | POA: Diagnosis not present

## 2021-02-13 DIAGNOSIS — G4733 Obstructive sleep apnea (adult) (pediatric): Secondary | ICD-10-CM | POA: Diagnosis not present

## 2021-02-20 ENCOUNTER — Other Ambulatory Visit: Payer: Self-pay

## 2021-02-21 MED ORDER — PHENOBARBITAL 64.8 MG PO TABS
64.8000 mg | ORAL_TABLET | Freq: Two times a day (BID) | ORAL | 1 refills | Status: DC
Start: 2021-02-21 — End: 2021-03-19

## 2021-02-21 NOTE — Telephone Encounter (Signed)
Please let patient know I am refilling this medication, but she needs to schedule an appointment with me.   Thanks, Caryssa Elzey J Kanyon Seibold, MD  

## 2021-02-22 ENCOUNTER — Telehealth (HOSPITAL_COMMUNITY): Payer: Self-pay

## 2021-02-22 ENCOUNTER — Other Ambulatory Visit (HOSPITAL_COMMUNITY): Payer: Self-pay | Admitting: *Deleted

## 2021-02-22 MED ORDER — AMIODARONE HCL 200 MG PO TABS
100.0000 mg | ORAL_TABLET | Freq: Every day | ORAL | 3 refills | Status: DC
Start: 2021-02-22 — End: 2021-05-15

## 2021-02-22 MED ORDER — EDOXABAN TOSYLATE 60 MG PO TABS
60.0000 mg | ORAL_TABLET | Freq: Every day | ORAL | 11 refills | Status: DC
Start: 2021-02-22 — End: 2022-01-17

## 2021-02-22 NOTE — Telephone Encounter (Signed)
I called Ms Schlafer to check in and se how she is doing. She reported feeling well but advised she it out of medications. Upon investigation there was a mixup with her signing up for mail order medications via [facility] but she did not receive them. As a result she has been without medications for about a week. I contacted [facility] who advised they had a partial profile on her so I had the HF clinic send the necessary medications over and they advised they would get it filled as soon as possible. Since some medications are too early to fill, I reached out to the clinic who advised they would assist with her medication co-pay this month. I will reach back out tomorrow to work on having her medications picked up.   Jacqualine Code, EMT 02/22/21

## 2021-02-26 ENCOUNTER — Other Ambulatory Visit (HOSPITAL_COMMUNITY): Payer: Self-pay

## 2021-02-26 NOTE — Progress Notes (Signed)
zack reached out to her last week and she was about to run out of her meds and [facility] not able to send anymore bubble packs out due to her owing $900 to them.  She told zack she was out of meds but she did have a few more days worth from the bubble packs.  She did get her meds from local [facility] She does not have vit d3, omeprazole.  Everything else she has now and I placed in pill box.  Once these bottles run out the plan is to her move her to local delivery and [facility].   Kerry Hough, EMT-Paramedic 02/27/21

## 2021-03-02 DIAGNOSIS — G4733 Obstructive sleep apnea (adult) (pediatric): Secondary | ICD-10-CM | POA: Diagnosis not present

## 2021-03-05 ENCOUNTER — Other Ambulatory Visit (HOSPITAL_COMMUNITY): Payer: Self-pay

## 2021-03-05 NOTE — Progress Notes (Signed)
Paramedicine Encounter    Patient ID: Misty Gray, female    DOB: 28-Jan-1951, 69 y.o.   MRN: [id number]   Patient Care Team: Latrelle Dodrill, MD as PCP - General (Family Medicine) [name], MD (Cardiology)  Patient Active Problem List   Diagnosis Date Noted  . Hand injury, left, initial encounter 09/22/2020  . CHF exacerbation (HCC) 07/12/2020  . Hospital discharge follow-up 11/17/2019  . Onychomycosis 11/17/2019  . Sepsis due to pneumonia (HCC) 11/08/2019  . Abdominal wall hernia 11/05/2017  . Anticoagulated 11/05/2017  . Rash and nonspecific skin eruption 09/15/2017  . Ventral hernia without obstruction or gangrene 08/15/2017  . Skin irritation 07/23/2017  . Chronic venous stasis dermatitis 12/26/2016  . Urinary tract infection without hematuria   . Frequent PVCs   . Hypokalemia   . Housing problems 11/23/2016  . Chronic atrial fibrillation (HCC) 11/01/2016  . Hypotension 10/31/2016  . Special screening for malignant neoplasms, colon 08/12/2016  . Heme positive stool 08/09/2016  . Intertrigo 08/09/2016  . Chronic anticoagulation 04/26/2016  . Chronic venous insufficiency   . Preventative health care 10/05/2014  . Breast mass, left 10/05/2014  . Osteopenia 09/15/2014  . Hypertension 01/04/2014  . Chronic systolic dysfunction of left ventricle 05/05/2013  . At high risk for falls 02/11/2013  . Vitamin D deficiency 07/15/2012  . Sinoatrial node dysfunction (HCC) 05/08/2011  . Pacemaker 08/10/2009  . Allergic rhinitis 05/31/2008  . Morbid obesity (HCC) 02/12/2007  . GASTROESOPHAGEAL REFLUX, NO ESOPHAGITIS 02/12/2007  . Female stress incontinence 02/12/2007  . Seizure disorder (HCC) 02/12/2007  . OSA (obstructive sleep apnea) 02/12/2007    Current Outpatient Medications:  .  amiodarone (PACERONE) 200 MG tablet, Take 0.5 tablets (100 mg total) by mouth daily., Disp: 45 tablet, Rfl: 3 .  edoxaban (SAVAYSA) 60 MG TABS tablet, Take 60 mg by mouth daily., Disp:  30 tablet, Rfl: 11 .  losartan (COZAAR) 25 MG tablet, Take 0.5 tablets (12.5 mg total) by mouth daily., Disp: 15 tablet, Rfl: 6 .  PHENobarbital (LUMINAL) 64.8 MG tablet, Take 1 tablet (64.8 mg total) by mouth 2 (two) times daily., Disp: 56 tablet, Rfl: 1 .  potassium chloride (KLOR-CON) 10 MEQ tablet, TAKE 4 TABLETS BY MOUTH THREE TIMES DAILY (MORNING, NOON, AND NIGHT), Disp: 360 tablet, Rfl: 6 .  spironolactone (ALDACTONE) 25 MG tablet, Take 1 tablet (25 mg total) by mouth daily., Disp: 30 tablet, Rfl: 6 .  torsemide (DEMADEX) 20 MG tablet, Take 4 tablets (80 mg total) by mouth in the morning AND 3 tablets (60 mg total) every evening., Disp: 210 tablet, Rfl: 6 .  cholecalciferol (VITAMIN D) 25 MCG (1000 UNIT) tablet, TAKE 1 TABLET BY MOUTH ONCE DAILY. (Patient not taking: No sig reported), Disp: 28 tablet, Rfl: 11 .  fluticasone (FLONASE) 50 MCG/ACT nasal spray, Place 2 sprays into both nostrils daily as needed. For congestion. (Patient not taking: No sig reported), Disp: 16 g, Rfl: 1 .  HYDROcodone-acetaminophen (NORCO) 5-325 MG tablet, Take 1-2 tablets by mouth every 6 (six) hours as needed. (Patient not taking: No sig reported), Disp: 15 tablet, Rfl: 0 .  omeprazole (PRILOSEC) 20 MG capsule, TAKE (1) CAPSULE BY MOUTH ONCE DAILY. (Patient not taking: Reported on 03/05/2021), Disp: 28 capsule, Rfl: 11 Allergies  Allergen Reactions  . Aspirin Hives and Rash  . Penicillins Rash and Other (See Comments)    Has patient had a PCN reaction causing immediate rash, facial/tongue/throat swelling, SOB or lightheadedness with hypotension: Yes Has patient had a  PCN reaction causing severe rash involving mucus membranes or skin necrosis: No Has patient had a PCN reaction that required hospitalization No Has patient had a PCN reaction occurring within the last 10 years: Yes If all of the above answers are "NO", then may proceed with Cephalosporin use.  Patient has tolerated cephalosporins in 2017.  . Tape  Other (See Comments)    NO PAPER TAPE-MUST BE MEDICAL TAPE - unknown reaction NO PAPER TAPE-MUST BE MEDICAL TAPE - unknown reaction  . Wool Alcohol [Lanolin] Hives  . Amoxicillin Rash    Patient has tolerated cephalosporins  . Ampicillin Itching and Rash    Patient has tolerated cephalosporins      Social History   Socioeconomic History  . Marital status: Married    Spouse name: [name]  . Number of children: 4  . Years of education: 62  . Highest education level: Not on file  Occupational History  . Occupation: disabled  Tobacco Use  . Smoking status: Former Smoker    Packs/day: 1.00    Years: 7.00    Pack years: 7.00    Types: Cigarettes    Quit date: 03/16/1969    Years since quitting: 52.0  . Smokeless tobacco: Never Used  Vaping Use  . Vaping Use: Never used  Substance and Sexual Activity  . Alcohol use: No  . Drug use: No  . Sexual activity: Not Currently    Birth control/protection: Post-menopausal  Other Topics Concern  . Not on file  Social History Narrative   Lives with husband [name] lives with them as do his 2 children   Daughter, [name], and her 3 children live in [place] -  [name] is incarcerated   Nephew, Laverle Pillard lives with them   Daughter, [name], in [name] [address]   Moved June 2013 to a better home on [facility].      Lives with husband, son, grandson, granddaughter, nephew.   Social Determinants of Health   Financial Resource Strain: Medium Risk  . Difficulty of Paying Living Expenses: Somewhat hard  Food Insecurity: No Food Insecurity  . Worried About Programme researcher, broadcasting/film/video in the Last Year: Never true  . Ran Out of Food in the Last Year: Never true  Transportation Needs: No Transportation Needs  . Lack of Transportation (Medical): No  . Lack of Transportation (Non-Medical): No  Physical Activity: Not on file  Stress: Not on file  Social Connections: Not on file   Intimate Partner Violence: Not on file    Physical Exam      Future Appointments  Date Time Provider Department Center  03/08/2021  7:55 AM [facility] DEVICE REMOTES [name]  03/13/2021  2:45 PM Freddie Breech, DPM [facility]  03/27/2021 11:30 AM Latrelle Dodrill, MD [facility]  04/09/2021  7:55 AM [facility] DEVICE REMOTES [name]  05/10/2021  7:55 AM [facility] DEVICE REMOTES [name]  05/30/2021  1:40 PM [name], [name], MD [facility] None  06/11/2021  7:55 AM [facility] DEVICE REMOTES [name]    BP 110/76   Pulse (!) 54   Resp 18   Wt 172 lb (78 kg)   SpO2 97%   BMI 37.22 kg/m   Pt reports feeling pretty good, pt states breathing is the same.  She states drank more fluid this wknd b/c temp was rising and she felt it in her clothes so she backed  off fluid.  No swelling to her legs but her LE is darkened and purple but she reports they have been that way for a very long time now.  meds verified and pill box refilled.  I advised her soon we will be switching her back to bubble packs.  -nneds vit d and omeprazole  Weight yesterday-171  Kerry Hough, EMT-Paramedic [phone] [facility] Paramedic  03/05/21

## 2021-03-06 ENCOUNTER — Ambulatory Visit (INDEPENDENT_AMBULATORY_CARE_PROVIDER_SITE_OTHER): Payer: Medicare Other

## 2021-03-06 DIAGNOSIS — I495 Sick sinus syndrome: Secondary | ICD-10-CM | POA: Diagnosis not present

## 2021-03-06 LAB — CUP PACEART REMOTE DEVICE CHECK
Battery Remaining Longevity: 1 mo
Battery Remaining Percentage: 0.5 %
Battery Voltage: 2.6 V
Brady Statistic AP VP Percent: 0 %
Brady Statistic AP VS Percent: 0 %
Brady Statistic AS VP Percent: 0 %
Brady Statistic AS VS Percent: 0 %
Brady Statistic RA Percent Paced: 0 %
Brady Statistic RV Percent Paced: 21 %
Date Time Interrogation Session: 20220321152927
Implantable Lead Implant Date: 19920327
Implantable Lead Implant Date: 19920327
Implantable Lead Location: 753859
Implantable Lead Location: 753860
Implantable Pulse Generator Implant Date: 20120605
Lead Channel Impedance Value: 360 Ohm
Lead Channel Impedance Value: 490 Ohm
Lead Channel Pacing Threshold Amplitude: 1 V
Lead Channel Pacing Threshold Amplitude: 1.5 V
Lead Channel Pacing Threshold Pulse Width: 0.5 ms
Lead Channel Pacing Threshold Pulse Width: 1 ms
Lead Channel Sensing Intrinsic Amplitude: 0.2 mV
Lead Channel Sensing Intrinsic Amplitude: 7.2 mV
Lead Channel Setting Pacing Amplitude: 2.5 V
Lead Channel Setting Pacing Amplitude: 2.5 V
Lead Channel Setting Pacing Pulse Width: 1 ms
Lead Channel Setting Sensing Sensitivity: 1.5 mV
Pulse Gen Model: 2210
Pulse Gen Serial Number: 7254513

## 2021-03-12 ENCOUNTER — Other Ambulatory Visit (HOSPITAL_COMMUNITY): Payer: Self-pay

## 2021-03-12 NOTE — Progress Notes (Signed)
Paramedicine Encounter    Patient ID: Misty Gray, female    DOB: [birth year], 70 y.o.   MRN: [id number]   Patient Care Team: Latrelle Dodrill, MD as PCP - General (Family Medicine) [name], MD (Cardiology)  Patient Active Problem List   Diagnosis Date Noted  . Hand injury, left, initial encounter 09/22/2020  . CHF exacerbation (HCC) 07/12/2020  . Hospital discharge follow-up 11/17/2019  . Onychomycosis 11/17/2019  . Sepsis due to pneumonia (HCC) 11/08/2019  . Abdominal wall hernia 11/05/2017  . Anticoagulated 11/05/2017  . Rash and nonspecific skin eruption 09/15/2017  . Ventral hernia without obstruction or gangrene 08/15/2017  . Skin irritation 07/23/2017  . Chronic venous stasis dermatitis 12/26/2016  . Urinary tract infection without hematuria   . Frequent PVCs   . Hypokalemia   . Housing problems 11/23/2016  . Chronic atrial fibrillation (HCC) 11/01/2016  . Hypotension 10/31/2016  . Special screening for malignant neoplasms, colon 08/12/2016  . Heme positive stool 08/09/2016  . Intertrigo 08/09/2016  . Chronic anticoagulation 04/26/2016  . Chronic venous insufficiency   . Preventative health care 10/05/2014  . Breast mass, left 10/05/2014  . Osteopenia 09/15/2014  . Hypertension 01/04/2014  . Chronic systolic dysfunction of left ventricle 05/05/2013  . At high risk for falls 02/11/2013  . Vitamin D deficiency 07/15/2012  . Sinoatrial node dysfunction (HCC) 05/08/2011  . Pacemaker 08/10/2009  . Allergic rhinitis 05/31/2008  . Morbid obesity (HCC) 02/12/2007  . GASTROESOPHAGEAL REFLUX, NO ESOPHAGITIS 02/12/2007  . Female stress incontinence 02/12/2007  . Seizure disorder (HCC) 02/12/2007  . OSA (obstructive sleep apnea) 02/12/2007    Current Outpatient Medications:  .  amiodarone (PACERONE) 200 MG tablet, Take 0.5 tablets (100 mg total) by mouth daily., Disp: 45 tablet, Rfl: 3 .  edoxaban (SAVAYSA) 60 MG TABS tablet, Take 60 mg by mouth daily., Disp:  30 tablet, Rfl: 11 .  losartan (COZAAR) 25 MG tablet, Take 0.5 tablets (12.5 mg total) by mouth daily., Disp: 15 tablet, Rfl: 6 .  PHENobarbital (LUMINAL) 64.8 MG tablet, Take 1 tablet (64.8 mg total) by mouth 2 (two) times daily., Disp: 56 tablet, Rfl: 1 .  potassium chloride (KLOR-CON) 10 MEQ tablet, TAKE 4 TABLETS BY MOUTH THREE TIMES DAILY (MORNING, NOON, AND NIGHT), Disp: 360 tablet, Rfl: 6 .  spironolactone (ALDACTONE) 25 MG tablet, Take 1 tablet (25 mg total) by mouth daily., Disp: 30 tablet, Rfl: 6 .  torsemide (DEMADEX) 20 MG tablet, Take 4 tablets (80 mg total) by mouth in the morning AND 3 tablets (60 mg total) every evening., Disp: 210 tablet, Rfl: 6 .  cholecalciferol (VITAMIN D) 25 MCG (1000 UNIT) tablet, TAKE 1 TABLET BY MOUTH ONCE DAILY. (Patient not taking: No sig reported), Disp: 28 tablet, Rfl: 11 .  fluticasone (FLONASE) 50 MCG/ACT nasal spray, Place 2 sprays into both nostrils daily as needed. For congestion. (Patient not taking: No sig reported), Disp: 16 g, Rfl: 1 .  HYDROcodone-acetaminophen (NORCO) 5-325 MG tablet, Take 1-2 tablets by mouth every 6 (six) hours as needed. (Patient not taking: No sig reported), Disp: 15 tablet, Rfl: 0 .  omeprazole (PRILOSEC) 20 MG capsule, TAKE (1) CAPSULE BY MOUTH ONCE DAILY. (Patient not taking: No sig reported), Disp: 28 capsule, Rfl: 11 Allergies  Allergen Reactions  . Aspirin Hives and Rash  . Penicillins Rash and Other (See Comments)    Has patient had a PCN reaction causing immediate rash, facial/tongue/throat swelling, SOB or lightheadedness with hypotension: Yes Has patient had a  PCN reaction causing severe rash involving mucus membranes or skin necrosis: No Has patient had a PCN reaction that required hospitalization No Has patient had a PCN reaction occurring within the last 10 years: Yes If all of the above answers are "NO", then may proceed with Cephalosporin use.  Patient has tolerated cephalosporins in 2017.  . Tape Other  (See Comments)    NO PAPER TAPE-MUST BE MEDICAL TAPE - unknown reaction NO PAPER TAPE-MUST BE MEDICAL TAPE - unknown reaction  . Wool Alcohol [Lanolin] Hives  . Amoxicillin Rash    Patient has tolerated cephalosporins  . Ampicillin Itching and Rash    Patient has tolerated cephalosporins      Social History   Socioeconomic History  . Marital status: Married    Spouse name: [name]  . Number of children: 4  . Years of education: 49  . Highest education level: Not on file  Occupational History  . Occupation: disabled  Tobacco Use  . Smoking status: Former Smoker    Packs/day: 1.00    Years: 7.00    Pack years: 7.00    Types: Cigarettes    Quit date: 03/16/1969    Years since quitting: 52.0  . Smokeless tobacco: Never Used  Vaping Use  . Vaping Use: Never used  Substance and Sexual Activity  . Alcohol use: No  . Drug use: No  . Sexual activity: Not Currently    Birth control/protection: Post-menopausal  Other Topics Concern  . Not on file  Social History Narrative   Lives with husband [name] lives with them as do his 2 children   Daughter, [name], and her 3 children live in [place] -  [name] is incarcerated   Nephew, Naiah Donahoe lives with them   Daughter, [name], in [name] [address]   Moved June 2013 to a better home on [facility].      Lives with husband, son, grandson, granddaughter, nephew.   Social Determinants of Health   Financial Resource Strain: Medium Risk  . Difficulty of Paying Living Expenses: Somewhat hard  Food Insecurity: No Food Insecurity  . Worried About Programme researcher, broadcasting/film/video in the Last Year: Never true  . Ran Out of Food in the Last Year: Never true  Transportation Needs: No Transportation Needs  . Lack of Transportation (Medical): No  . Lack of Transportation (Non-Medical): No  Physical Activity: Not on file  Stress: Not on file  Social Connections: Not on file  Intimate  Partner Violence: Not on file    Physical Exam      Future Appointments  Date Time Provider Department Center  03/13/2021  2:45 PM Freddie Breech, DPM [facility]  03/27/2021 11:30 AM Latrelle Dodrill, MD [facility]  04/09/2021  7:55 AM [facility] DEVICE REMOTES [name]  05/10/2021  7:55 AM [facility] DEVICE [name]  05/30/2021  1:40 PM [name], [name], MD [facility] None  06/11/2021  7:55 AM [name]    BP 110/72   Pulse 82   Resp 18   Wt 172 lb (78 kg)   SpO2 98%   BMI 37.22 kg/m   Weight yesterday-174 Last visit weight-172  Pt reports doing ok, she denies increased sob-about the same.  No c/p, no dizziness.  No missed doses of her meds.  meds verified and pill box refilled.  -out of the vit d and pantoprazole-think [facility]  [facility]. Will [facility].  Will get them to pull her other meds over too for future refills.  She just woke up shortly before I arrived, she just took meds during our visit.  She does have slight edema to left foot. She will elevate her feet and limit her fluids.    Kerry Hough, EMT-Paramedic [phone] [facility] Paramedic  03/12/21

## 2021-03-13 ENCOUNTER — Ambulatory Visit: Payer: Medicare HMO | Admitting: Podiatry

## 2021-03-13 ENCOUNTER — Other Ambulatory Visit (HOSPITAL_COMMUNITY): Payer: Self-pay | Admitting: *Deleted

## 2021-03-13 MED ORDER — LOSARTAN POTASSIUM 25 MG PO TABS
12.5000 mg | ORAL_TABLET | Freq: Every day | ORAL | 6 refills | Status: DC
Start: 2021-03-13 — End: 2021-06-08

## 2021-03-14 ENCOUNTER — Other Ambulatory Visit: Payer: Self-pay

## 2021-03-14 MED ORDER — VITAMIN D3 25 MCG (1000 UNIT) PO TABS
1000.0000 [IU] | ORAL_TABLET | Freq: Every day | ORAL | 1 refills | Status: DC
Start: 2021-03-14 — End: 2021-04-24

## 2021-03-14 MED ORDER — OMEPRAZOLE 20 MG PO CPDR
DELAYED_RELEASE_CAPSULE | ORAL | 1 refills | Status: DC
Start: 2021-03-14 — End: 2021-04-24

## 2021-03-15 NOTE — Progress Notes (Signed)
Remote pacemaker transmission.   

## 2021-03-16 DIAGNOSIS — G4733 Obstructive sleep apnea (adult) (pediatric): Secondary | ICD-10-CM | POA: Diagnosis not present

## 2021-03-16 DIAGNOSIS — R0902 Hypoxemia: Secondary | ICD-10-CM | POA: Diagnosis not present

## 2021-03-19 ENCOUNTER — Other Ambulatory Visit (HOSPITAL_COMMUNITY): Payer: Self-pay

## 2021-03-19 ENCOUNTER — Telehealth (HOSPITAL_COMMUNITY): Payer: Self-pay | Admitting: Pharmacist

## 2021-03-19 ENCOUNTER — Other Ambulatory Visit: Payer: Self-pay

## 2021-03-19 MED ORDER — PHENOBARBITAL 64.8 MG PO TABS
64.8000 mg | ORAL_TABLET | Freq: Two times a day (BID) | ORAL | 1 refills | Status: DC
Start: 2021-03-19 — End: 2021-05-17

## 2021-03-19 NOTE — Telephone Encounter (Signed)
Patient Advocate Encounter   Received notification from [facility] that prior authorization for Savaysa (edoxaban) is required.   PA submitted on CoverMyMeds Key [id number] Status is pending   Will continue to follow.   Karle Plumber, PharmD, BCPS, BCCP, CPP Heart Failure Clinic Pharmacist [phone]

## 2021-03-19 NOTE — Telephone Encounter (Signed)
Patient no longer using [facility]. Please send new RX to [facility] for their records.

## 2021-03-19 NOTE — Progress Notes (Signed)
Paramedicine Encounter    Patient ID: Misty Gray, female    DOB: August 09, 1951, 70 y.o.   MRN: [id number]   Patient Care Team: Latrelle Dodrill, MD as PCP - General (Family Medicine) [name], MD (Cardiology)  Patient Active Problem List   Diagnosis Date Noted  . Hand injury, left, initial encounter 09/22/2020  . CHF exacerbation (HCC) 07/12/2020  . Hospital discharge follow-up 11/17/2019  . Onychomycosis 11/17/2019  . Sepsis due to pneumonia (HCC) 11/08/2019  . Abdominal wall hernia 11/05/2017  . Anticoagulated 11/05/2017  . Rash and nonspecific skin eruption 09/15/2017  . Ventral hernia without obstruction or gangrene 08/15/2017  . Skin irritation 07/23/2017  . Chronic venous stasis dermatitis 12/26/2016  . Urinary tract infection without hematuria   . Frequent PVCs   . Hypokalemia   . Housing problems 11/23/2016  . Chronic atrial fibrillation (HCC) 11/01/2016  . Hypotension 10/31/2016  . Special screening for malignant neoplasms, colon 08/12/2016  . Heme positive stool 08/09/2016  . Intertrigo 08/09/2016  . Chronic anticoagulation 04/26/2016  . Chronic venous insufficiency   . Preventative health care 10/05/2014  . Breast mass, left 10/05/2014  . Osteopenia 09/15/2014  . Hypertension 01/04/2014  . Chronic systolic dysfunction of left ventricle 05/05/2013  . At high risk for falls 02/11/2013  . Vitamin D deficiency 07/15/2012  . Sinoatrial node dysfunction (HCC) 05/08/2011  . Pacemaker 08/10/2009  . Allergic rhinitis 05/31/2008  . Morbid obesity (HCC) 02/12/2007  . GASTROESOPHAGEAL REFLUX, NO ESOPHAGITIS 02/12/2007  . Female stress incontinence 02/12/2007  . Seizure disorder (HCC) 02/12/2007  . OSA (obstructive sleep apnea) 02/12/2007    Current Outpatient Medications:  .  amiodarone (PACERONE) 200 MG tablet, Take 0.5 tablets (100 mg total) by mouth daily., Disp: 45 tablet, Rfl: 3 .  edoxaban (SAVAYSA) 60 MG TABS tablet, Take 60 mg by mouth daily., Disp:  30 tablet, Rfl: 11 .  losartan (COZAAR) 25 MG tablet, Take 0.5 tablets (12.5 mg total) by mouth daily., Disp: 15 tablet, Rfl: 6 .  PHENobarbital (LUMINAL) 64.8 MG tablet, Take 1 tablet (64.8 mg total) by mouth 2 (two) times daily., Disp: 56 tablet, Rfl: 1 .  potassium chloride (KLOR-CON) 10 MEQ tablet, TAKE 4 TABLETS BY MOUTH THREE TIMES DAILY (MORNING, NOON, AND NIGHT), Disp: 360 tablet, Rfl: 6 .  spironolactone (ALDACTONE) 25 MG tablet, Take 1 tablet (25 mg total) by mouth daily., Disp: 30 tablet, Rfl: 6 .  torsemide (DEMADEX) 20 MG tablet, Take 4 tablets (80 mg total) by mouth in the morning AND 3 tablets (60 mg total) every evening., Disp: 210 tablet, Rfl: 6 .  cholecalciferol (VITAMIN D) 25 MCG (1000 UNIT) tablet, Take 1 tablet (1,000 Units total) by mouth daily. (Patient not taking: Reported on 03/19/2021), Disp: 28 tablet, Rfl: 1 .  fluticasone (FLONASE) 50 MCG/ACT nasal spray, Place 2 sprays into both nostrils daily as needed. For congestion. (Patient not taking: No sig reported), Disp: 16 g, Rfl: 1 .  HYDROcodone-acetaminophen (NORCO) 5-325 MG tablet, Take 1-2 tablets by mouth every 6 (six) hours as needed. (Patient not taking: No sig reported), Disp: 15 tablet, Rfl: 0 .  omeprazole (PRILOSEC) 20 MG capsule, TAKE (1) CAPSULE BY MOUTH ONCE DAILY. (Patient not taking: Reported on 03/19/2021), Disp: 28 capsule, Rfl: 1 Allergies  Allergen Reactions  . Aspirin Hives and Rash  . Penicillins Rash and Other (See Comments)    Has patient had a PCN reaction causing immediate rash, facial/tongue/throat swelling, SOB or lightheadedness with hypotension: Yes Has patient  had a PCN reaction causing severe rash involving mucus membranes or skin necrosis: No Has patient had a PCN reaction that required hospitalization No Has patient had a PCN reaction occurring within the last 10 years: Yes If all of the above answers are "NO", then may proceed with Cephalosporin use.  Patient has tolerated cephalosporins in  2017.  . Tape Other (See Comments)    NO PAPER TAPE-MUST BE MEDICAL TAPE - unknown reaction NO PAPER TAPE-MUST BE MEDICAL TAPE - unknown reaction  . Wool Alcohol [Lanolin] Hives  . Amoxicillin Rash    Patient has tolerated cephalosporins  . Ampicillin Itching and Rash    Patient has tolerated cephalosporins      Social History   Socioeconomic History  . Marital status: Married    Spouse name: [name]  . Number of children: 4  . Years of education: 62  . Highest education level: Not on file  Occupational History  . Occupation: disabled  Tobacco Use  . Smoking status: Former Smoker    Packs/day: 1.00    Years: 7.00    Pack years: 7.00    Types: Cigarettes    Quit date: 03/16/1969    Years since quitting: 52.0  . Smokeless tobacco: Never Used  Vaping Use  . Vaping Use: Never used  Substance and Sexual Activity  . Alcohol use: No  . Drug use: No  . Sexual activity: Not Currently    Birth control/protection: Post-menopausal  Other Topics Concern  . Not on file  Social History Narrative   Lives with husband [name] lives with them as do his 2 children   Daughter, [name], and her 3 children live in [place] -  [name] is incarcerated   Nephew, Hadlea Furuya lives with them   Daughter, [name], in [name] [address]   Moved June 2013 to a better home on [facility].      Lives with husband, son, grandson, granddaughter, nephew.   Social Determinants of Health   Financial Resource Strain: Medium Risk  . Difficulty of Paying Living Expenses: Somewhat hard  Food Insecurity: No Food Insecurity  . Worried About Programme researcher, broadcasting/film/video in the Last Year: Never true  . Ran Out of Food in the Last Year: Never true  Transportation Needs: No Transportation Needs  . Lack of Transportation (Medical): No  . Lack of Transportation (Non-Medical): No  Physical Activity: Not on file  Stress: Not on file  Social Connections:  Not on file  Intimate Partner Violence: Not on file    Physical Exam      Future Appointments  Date Time Provider Department Center  03/27/2021 11:30 AM Latrelle Dodrill, MD [facility]  04/09/2021  7:55 AM [facility] DEVICE REMOTES [name]  05/10/2021  7:55 AM [facility] DEVICE REMOTES [name]  05/30/2021  1:40 PM [name], [name], MD [facility] None  06/11/2021  7:55 AM [facility] DEVICE REMOTES [name]    BP 110/72   Pulse 72   Resp 18   Wt 173 lb (78.5 kg)   SpO2 97%   BMI 37.44 kg/m   Weight yesterday-173 Last visit weight-172  Pt reports no issues or complaints.  [facility] to get some of her meds [facility] over and delivered, but pt reports they have not heard from them. Will call [facility] and f/u.  She took her meds this morning, meds verified and pill box refilled.  -  she needs vit d, savasya, omeprazole, phenobarbitol.   -pharmacy actually called me b/c they couldn't get in touch with her to confirm delivery. I told pharmacy that she is home today but will try to call her myself--I got no answer either. Will try again. I think pharmacy is going to try to deliver today.   Kerry Hough, EMT-Paramedic [phone] [facility] Paramedic  03/19/21

## 2021-03-20 NOTE — Telephone Encounter (Signed)
Advanced Heart Failure Patient Advocate Encounter  Prior Authorization for Misty Gray has been approved.    PA# [id number] Effective dates: 03/20/21 through 12/15/21  Karle Plumber, PharmD, BCPS, BCCP, CPP Heart Failure Clinic Pharmacist [phone]

## 2021-03-22 ENCOUNTER — Other Ambulatory Visit (HOSPITAL_COMMUNITY): Payer: Self-pay

## 2021-03-26 ENCOUNTER — Other Ambulatory Visit (HOSPITAL_COMMUNITY): Payer: Self-pay

## 2021-03-26 ENCOUNTER — Telehealth: Payer: Self-pay

## 2021-03-26 NOTE — Telephone Encounter (Signed)
Fax received for prior authorization for Vitamin D3. Clinical questions placed in provider's box.   Please return to "RN team" once completed.   Veronda Prude, RN

## 2021-03-26 NOTE — Progress Notes (Signed)
Paramedicine Encounter    Patient ID: Misty Gray, female    DOB: [birth year], 70 y.o.   MRN: [id number]   Patient Care Team: Latrelle Dodrill, MD as PCP - General (Family Medicine) [name], MD (Cardiology) Burna Sis, LCSW as Social Worker (Licensed Clinical Social Worker)  Patient Active Problem List   Diagnosis Date Noted  . Hand injury, left, initial encounter 09/22/2020  . CHF exacerbation (HCC) 07/12/2020  . Hospital discharge follow-up 11/17/2019  . Onychomycosis 11/17/2019  . Sepsis due to pneumonia (HCC) 11/08/2019  . Abdominal wall hernia 11/05/2017  . Anticoagulated 11/05/2017  . Rash and nonspecific skin eruption 09/15/2017  . Ventral hernia without obstruction or gangrene 08/15/2017  . Skin irritation 07/23/2017  . Chronic venous stasis dermatitis 12/26/2016  . Urinary tract infection without hematuria   . Frequent PVCs   . Hypokalemia   . Housing problems 11/23/2016  . Chronic atrial fibrillation (HCC) 11/01/2016  . Hypotension 10/31/2016  . Special screening for malignant neoplasms, colon 08/12/2016  . Heme positive stool 08/09/2016  . Intertrigo 08/09/2016  . Chronic anticoagulation 04/26/2016  . Chronic venous insufficiency   . Preventative health care 10/05/2014  . Breast mass, left 10/05/2014  . Osteopenia 09/15/2014  . Hypertension 01/04/2014  . Chronic systolic dysfunction of left ventricle 05/05/2013  . At high risk for falls 02/11/2013  . Vitamin D deficiency 07/15/2012  . Sinoatrial node dysfunction (HCC) 05/08/2011  . Pacemaker 08/10/2009  . Allergic rhinitis 05/31/2008  . Morbid obesity (HCC) 02/12/2007  . GASTROESOPHAGEAL REFLUX, NO ESOPHAGITIS 02/12/2007  . Female stress incontinence 02/12/2007  . Seizure disorder (HCC) 02/12/2007  . OSA (obstructive sleep apnea) 02/12/2007    Current Outpatient Medications:  .  amiodarone (PACERONE) 200 MG tablet, Take 0.5 tablets (100 mg total) by mouth daily., Disp: 45 tablet, Rfl: 3 .   cholecalciferol (VITAMIN D) 25 MCG (1000 UNIT) tablet, Take 1 tablet (1,000 Units total) by mouth daily., Disp: 28 tablet, Rfl: 1 .  edoxaban (SAVAYSA) 60 MG TABS tablet, Take 60 mg by mouth daily., Disp: 30 tablet, Rfl: 11 .  losartan (COZAAR) 25 MG tablet, Take 0.5 tablets (12.5 mg total) by mouth daily., Disp: 15 tablet, Rfl: 6 .  omeprazole (PRILOSEC) 20 MG capsule, TAKE (1) CAPSULE BY MOUTH ONCE DAILY., Disp: 28 capsule, Rfl: 1 .  PHENobarbital (LUMINAL) 64.8 MG tablet, Take 1 tablet (64.8 mg total) by mouth 2 (two) times daily., Disp: 56 tablet, Rfl: 1 .  potassium chloride (KLOR-CON) 10 MEQ tablet, TAKE 4 TABLETS BY MOUTH THREE TIMES DAILY (MORNING, NOON, AND NIGHT), Disp: 360 tablet, Rfl: 6 .  spironolactone (ALDACTONE) 25 MG tablet, Take 1 tablet (25 mg total) by mouth daily., Disp: 30 tablet, Rfl: 6 .  torsemide (DEMADEX) 20 MG tablet, Take 4 tablets (80 mg total) by mouth in the morning AND 3 tablets (60 mg total) every evening., Disp: 210 tablet, Rfl: 6 .  fluticasone (FLONASE) 50 MCG/ACT nasal spray, Place 2 sprays into both nostrils daily as needed. For congestion. (Patient not taking: No sig reported), Disp: 16 g, Rfl: 1 .  HYDROcodone-acetaminophen (NORCO) 5-325 MG tablet, Take 1-2 tablets by mouth every 6 (six) hours as needed. (Patient not taking: No sig reported), Disp: 15 tablet, Rfl: 0 Allergies  Allergen Reactions  . Aspirin Hives and Rash  . Penicillins Rash and Other (See Comments)    Has patient had a PCN reaction causing immediate rash, facial/tongue/throat swelling, SOB or lightheadedness with hypotension: Yes Has patient had  a PCN reaction causing severe rash involving mucus membranes or skin necrosis: No Has patient had a PCN reaction that required hospitalization No Has patient had a PCN reaction occurring within the last 10 years: Yes If all of the above answers are "NO", then may proceed with Cephalosporin use.  Patient has tolerated cephalosporins in 2017.  .  Tape Other (See Comments)    NO PAPER TAPE-MUST BE MEDICAL TAPE - unknown reaction NO PAPER TAPE-MUST BE MEDICAL TAPE - unknown reaction  . Wool Alcohol [Lanolin] Hives  . Amoxicillin Rash    Patient has tolerated cephalosporins  . Ampicillin Itching and Rash    Patient has tolerated cephalosporins      Social History   Socioeconomic History  . Marital status: Married    Spouse name: [name]  . Number of children: 4  . Years of education: 52  . Highest education level: Not on file  Occupational History  . Occupation: disabled  Tobacco Use  . Smoking status: Former Smoker    Packs/day: 1.00    Years: 7.00    Pack years: 7.00    Types: Cigarettes    Quit date: 03/16/1969    Years since quitting: 52.0  . Smokeless tobacco: Never Used  Vaping Use  . Vaping Use: Never used  Substance and Sexual Activity  . Alcohol use: No  . Drug use: No  . Sexual activity: Not Currently    Birth control/protection: Post-menopausal  Other Topics Concern  . Not on file  Social History Narrative   Lives with husband [name] lives with them as do his 2 children   Daughter, [name], and her 3 children live in [place] -  [name] is incarcerated   Nephew, Midge Momon lives with them   Daughter, [name], in [facility] [name] [address]   Moved June 2013 to a better home on [facility].      Lives with husband, son, grandson, granddaughter, nephew.   Social Determinants of Health   Financial Resource Strain: Medium Risk  . Difficulty of Paying Living Expenses: Somewhat hard  Food Insecurity: No Food Insecurity  . Worried About Programme researcher, broadcasting/film/video in the Last Year: Never true  . Ran Out of Food in the Last Year: Never true  Transportation Needs: No Transportation Needs  . Lack of Transportation (Medical): No  . Lack of Transportation (Non-Medical): No  Physical Activity: Not on file  Stress: Not on file  Social Connections: Not on file   Intimate Partner Violence: Not on file    Physical Exam      Future Appointments  Date Time Provider Department Center  03/27/2021 11:30 AM Latrelle Dodrill, MD [facility]  04/09/2021  7:55 AM [facility] DEVICE REMOTES [name]  05/10/2021  7:55 AM [facility] DEVICE REMOTES [name]  05/30/2021  1:40 PM [name], [name], MD [facility] None  06/11/2021  7:55 AM [facility] DEVICE REMOTES [name]    BP 110/70   Pulse 64   Resp 18   Wt 171 lb (77.6 kg)   SpO2 99%   BMI 37.00 kg/m   Weight yesterday-182 ??? Last visit weight-173  Pt reports she is doing ok. She denies sob, no dizziness, no c/p, no missed doses of her meds. She has refused meds that were delivered to her due to no money for them. I have told her to not refuse meds-she could call in to pharmacy  to make payments. She began to talk about on the 3rd of the month she gets paid and how quickly her money gets gone for gas and the truck. I have advised her to set aside money for her meds and to call in when she gets money. I have told her too that it is not fair to her that nobody else in the house contributes to the costs there.  Her husband does not get extra help that she knows of so I gave her that number for him to call to see if he is eligible and if so that would put more money back in pocket to help out.  meds verified and pill box refilled. She is short on savasya for a few days-I advised her the pharmacy will bring it out and she will need to take it out of the bottle.  Pharmacy will also bring out her phenobarbital as well.  I just reiterated again that she needs to accept the meds that are brought and will work out payment when she can. She understood and agreed.   Kerry Hough, EMT-Paramedic [phone] [facility] Paramedic  03/26/21

## 2021-03-27 ENCOUNTER — Other Ambulatory Visit: Payer: Self-pay

## 2021-03-27 ENCOUNTER — Ambulatory Visit (INDEPENDENT_AMBULATORY_CARE_PROVIDER_SITE_OTHER): Payer: Medicare Other | Admitting: Family Medicine

## 2021-03-27 ENCOUNTER — Encounter: Payer: Self-pay | Admitting: Family Medicine

## 2021-03-27 VITALS — BP 104/64 | HR 72 | Wt 177.6 lb

## 2021-03-27 DIAGNOSIS — K219 Gastro-esophageal reflux disease without esophagitis: Secondary | ICD-10-CM | POA: Diagnosis not present

## 2021-03-27 DIAGNOSIS — I482 Chronic atrial fibrillation, unspecified: Secondary | ICD-10-CM

## 2021-03-27 DIAGNOSIS — K439 Ventral hernia without obstruction or gangrene: Secondary | ICD-10-CM

## 2021-03-27 DIAGNOSIS — G40909 Epilepsy, unspecified, not intractable, without status epilepticus: Secondary | ICD-10-CM

## 2021-03-27 DIAGNOSIS — I872 Venous insufficiency (chronic) (peripheral): Secondary | ICD-10-CM

## 2021-03-27 DIAGNOSIS — G4733 Obstructive sleep apnea (adult) (pediatric): Secondary | ICD-10-CM | POA: Diagnosis not present

## 2021-03-27 DIAGNOSIS — Z0000 Encounter for general adult medical examination without abnormal findings: Secondary | ICD-10-CM

## 2021-03-27 DIAGNOSIS — Z1211 Encounter for screening for malignant neoplasm of colon: Secondary | ICD-10-CM

## 2021-03-27 NOTE — Assessment & Plan Note (Signed)
Venous stasis dermatitis observed today on exam, mid way up lower legs bilaterally.  Plan: Will send message to CHF patient outreach team about possibility of pt receiving Unna boots.

## 2021-03-27 NOTE — Assessment & Plan Note (Signed)
Has been stable on Savayza without concerns of bleeding. Ran out of this two days ago.  Plan: Pt will receive Savayza refill coordinated by CHF patient outreach team. Coral Spikes

## 2021-03-27 NOTE — Assessment & Plan Note (Addendum)
Ran out of Phenobarbital two days ago. Has otherwise been stable on this, with last seizure three weeks ago.  Plan: Will receive refill through pharmacy delivery coordinated by CHF patient outreach team

## 2021-03-27 NOTE — Progress Notes (Addendum)
SUBJECTIVE:   CHIEF COMPLAINT / HPI:   Misty Gray returns today for management and evaluation of her multiple medical problems.  She notes that she went to a cookout two weeks ago where she drank water, KoolAid and Tea, and endorses worsened ankle swelling since then. She denies any worsened SOB or needing to use extra pillows at night.  The pt notes that she ran out of Phenobarbital (seizures) and Savaysa (Afib) two days ago without the ability to pay for these meds. Fortunately her EMT Kerry Hough with the CHF patient outreach team has helped coordinated a pharmacy delivery to the patient's house expected in the next day or so. Last seizure was roughly 3 weeks ago.  OSA - using CPAP nightly with good results, no concerns  GERD - taking prilosec 20mg  daily, symptoms well controlled with this medication  The pt otherwise denies any other concerns and endorses stable health.   PERTINENT  PMH / PSH:  HFrEF Venous stasis dermatitis A. Fib on chronic anticoagulation OSA Abdominal wall hernia  OBJECTIVE:   BP 104/64   Pulse 72   Wt 177 lb 9.6 oz (80.6 kg)   SpO2 99%   BMI 38.43 kg/m   Physical Activity: Not on file   Physical Exam Constitutional:      General: She is not in acute distress.    Appearance: Normal appearance. She is not ill-appearing.  Neck:     Vascular: No JVD.  Cardiovascular:     Rate and Rhythm: Normal rate and regular rhythm.     Pulses: Normal pulses.     Heart sounds: Normal heart sounds. No murmur heard. No friction rub.  Pulmonary:     Effort: Pulmonary effort is normal. No respiratory distress.     Breath sounds: Normal breath sounds. No stridor. No wheezing, rhonchi or rales.  Abdominal:     Tenderness: There is no abdominal tenderness. There is no guarding.  Musculoskeletal:     Right lower leg: Edema present.     Left lower leg: Edema present.  Skin:    General: Skin is warm and dry.  Neurological:     Mental Status: She is alert  and oriented to person, place, and time. Mental status is at baseline.  Psychiatric:        Mood and Affect: Mood normal.        Behavior: Behavior normal.    Erythema and warmth noted on bilateral lower extremities over anterior shins. Flaking of skin, all consistent with venous stasis dermatitis. Nontender to palpation. 2+ pedal pulses bilaterally.  ASSESSMENT/PLAN:   Chronic venous stasis dermatitis Venous stasis dermatitis observed today on exam, mid way up lower legs bilaterally.  Plan: Will send message to CHF patient outreach team about possibility of pt receiving Unna boots.   Seizure disorder (HCC) Ran out of Phenobarbital two days ago. Has otherwise been stable on this, with last seizure three weeks ago.  Plan: Will receive refill through pharmacy delivery coordinated by CHF patient outreach team  Chronic atrial fibrillation (HCC) Has been stable on Savayza without concerns of bleeding. Ran out of this two days ago.  Plan: Pt will receive Savayza refill coordinated by CHF patient outreach team. Continue Savayza  Abdominal wall hernia Stable and reducible. Advised that patient go to ED if hernia ever becomes incarcerated and irreducible.   OSA (obstructive sleep apnea) Compliant with CPAP nightly.  GASTROESOPHAGEAL REFLUX, NO ESOPHAGITIS Well controlled. Continue current medication regimen.    Health  maintenance -Encouraged COVID vaccine, patient continues to decline, cites religious reasons -cologuard ordered   Marcelline Mates, Med Student [facility]    Patient seen along with MS3 student Marcelline Mates. I personally evaluated this patient along with the student, and verified all aspects of the history, physical exam, and medical decision making as documented by the student. I agree with the student's documentation and have made all necessary edits.  Levert Feinstein, MD  [facility]

## 2021-03-27 NOTE — Assessment & Plan Note (Deleted)
Recommended pt receive Covid 19 vaccine in light of anticipated medical benefit to patient. Pt notes she "doesn't believe in the vaccine," and endorses political and religious reasons for her position.

## 2021-03-27 NOTE — Patient Instructions (Signed)
  Will see if they can do unna boots on your legs at home  Will have them send you a new cologuard test  Follow up with me in 3 months, sooner if needed  Be well, Dr. Pollie Meyer

## 2021-03-27 NOTE — Assessment & Plan Note (Signed)
Stable and reducible. Advised that patient go to ED if hernia ever becomes incarcerated and irreducible.

## 2021-04-01 NOTE — Assessment & Plan Note (Signed)
Compliant with CPAP nightly.

## 2021-04-01 NOTE — Assessment & Plan Note (Signed)
Well controlled. Continue current medication regimen.  

## 2021-04-01 NOTE — Telephone Encounter (Signed)
PA completed, will return to [facility] RN team Latrelle Dodrill, MD

## 2021-04-02 ENCOUNTER — Other Ambulatory Visit (HOSPITAL_COMMUNITY): Payer: Self-pay

## 2021-04-02 ENCOUNTER — Telehealth: Payer: Self-pay | Admitting: Family Medicine

## 2021-04-02 DIAGNOSIS — I872 Venous insufficiency (chronic) (peripheral): Secondary | ICD-10-CM

## 2021-04-02 NOTE — Telephone Encounter (Signed)
Received fax from pharmacy, PA needed on Vitamin D3.  Clinical questions submitted via Cover My Meds.  Waiting on response, could take up to 72 hours.  Cover My Meds info: Key: Jeb Levering, CMA

## 2021-04-02 NOTE — Telephone Encounter (Signed)
Will refer to [facility] for unna boots Patient unable to get to clinic easily without assistance due to her seizure disorder - would be better served by [facility] services.

## 2021-04-02 NOTE — Progress Notes (Signed)
Paramedicine Encounter    Patient ID: Misty Gray, female    DOB: 1951/01/11, 70 y.o.   MRN: [id number]   Patient Care Team: Latrelle Dodrill, MD as PCP - General (Family Medicine) [name], MD (Cardiology) Burna Sis, LCSW as Social Worker (Licensed Clinical Social Worker)  Patient Active Problem List   Diagnosis Date Noted  . Hand injury, left, initial encounter 09/22/2020  . CHF exacerbation (HCC) 07/12/2020  . Hospital discharge follow-up 11/17/2019  . Onychomycosis 11/17/2019  . Abdominal wall hernia 11/05/2017  . Anticoagulated 11/05/2017  . Rash and nonspecific skin eruption 09/15/2017  . Ventral hernia without obstruction or gangrene 08/15/2017  . Skin irritation 07/23/2017  . Chronic venous stasis dermatitis 12/26/2016  . Urinary tract infection without hematuria   . Frequent PVCs   . Hypokalemia   . Housing problems 11/23/2016  . Chronic atrial fibrillation (HCC) 11/01/2016  . Hypotension 10/31/2016  . Healthcare maintenance 08/12/2016  . Heme positive stool 08/09/2016  . Intertrigo 08/09/2016  . Chronic anticoagulation 04/26/2016  . Chronic venous insufficiency   . Breast mass, left 10/05/2014  . Osteopenia 09/15/2014  . Hypertension 01/04/2014  . Chronic systolic dysfunction of left ventricle 05/05/2013  . At high risk for falls 02/11/2013  . Vitamin D deficiency 07/15/2012  . Sinoatrial node dysfunction (HCC) 05/08/2011  . Pacemaker 08/10/2009  . Allergic rhinitis 05/31/2008  . Morbid obesity (HCC) 02/12/2007  . GASTROESOPHAGEAL REFLUX, NO ESOPHAGITIS 02/12/2007  . Female stress incontinence 02/12/2007  . Seizure disorder (HCC) 02/12/2007  . OSA (obstructive sleep apnea) 02/12/2007    Current Outpatient Medications:  .  amiodarone (PACERONE) 200 MG tablet, Take 0.5 tablets (100 mg total) by mouth daily., Disp: 45 tablet, Rfl: 3 .  cholecalciferol (VITAMIN D) 25 MCG (1000 UNIT) tablet, Take 1 tablet (1,000 Units total) by mouth daily., Disp:  28 tablet, Rfl: 1 .  edoxaban (SAVAYSA) 60 MG TABS tablet, Take 60 mg by mouth daily., Disp: 30 tablet, Rfl: 11 .  losartan (COZAAR) 25 MG tablet, Take 0.5 tablets (12.5 mg total) by mouth daily., Disp: 15 tablet, Rfl: 6 .  omeprazole (PRILOSEC) 20 MG capsule, TAKE (1) CAPSULE BY MOUTH ONCE DAILY., Disp: 28 capsule, Rfl: 1 .  PHENobarbital (LUMINAL) 64.8 MG tablet, Take 1 tablet (64.8 mg total) by mouth 2 (two) times daily., Disp: 56 tablet, Rfl: 1 .  potassium chloride (KLOR-CON) 10 MEQ tablet, TAKE 4 TABLETS BY MOUTH THREE TIMES DAILY (MORNING, NOON, AND NIGHT), Disp: 360 tablet, Rfl: 6 .  spironolactone (ALDACTONE) 25 MG tablet, Take 1 tablet (25 mg total) by mouth daily., Disp: 30 tablet, Rfl: 6 .  torsemide (DEMADEX) 20 MG tablet, Take 4 tablets (80 mg total) by mouth in the morning AND 3 tablets (60 mg total) every evening., Disp: 210 tablet, Rfl: 6 .  fluticasone (FLONASE) 50 MCG/ACT nasal spray, Place 2 sprays into both nostrils daily as needed. For congestion. (Patient not taking: No sig reported), Disp: 16 g, Rfl: 1 .  HYDROcodone-acetaminophen (NORCO) 5-325 MG tablet, Take 1-2 tablets by mouth every 6 (six) hours as needed. (Patient not taking: No sig reported), Disp: 15 tablet, Rfl: 0 Allergies  Allergen Reactions  . Aspirin Hives and Rash  . Penicillins Rash and Other (See Comments)    Has patient had a PCN reaction causing immediate rash, facial/tongue/throat swelling, SOB or lightheadedness with hypotension: Yes Has patient had a PCN reaction causing severe rash involving mucus membranes or skin necrosis: No Has patient had a PCN  reaction that required hospitalization No Has patient had a PCN reaction occurring within the last 10 years: Yes If all of the above answers are "NO", then may proceed with Cephalosporin use.  Patient has tolerated cephalosporins in 2017.  . Tape Other (See Comments)    NO PAPER TAPE-MUST BE MEDICAL TAPE - unknown reaction NO PAPER TAPE-MUST BE MEDICAL  TAPE - unknown reaction  . Wool Alcohol [Lanolin] Hives  . Amoxicillin Rash    Patient has tolerated cephalosporins  . Ampicillin Itching and Rash    Patient has tolerated cephalosporins      Social History   Socioeconomic History  . Marital status: Married    Spouse name: [name]  . Number of children: 4  . Years of education: 22  . Highest education level: Not on file  Occupational History  . Occupation: disabled  Tobacco Use  . Smoking status: Former Smoker    Packs/day: 1.00    Years: 7.00    Pack years: 7.00    Types: Cigarettes    Quit date: 03/16/1969    Years since quitting: 52.0  . Smokeless tobacco: Never Used  Vaping Use  . Vaping Use: Never used  Substance and Sexual Activity  . Alcohol use: No  . Drug use: No  . Sexual activity: Not Currently    Birth control/protection: Post-menopausal  Other Topics Concern  . Not on file  Social History Narrative   Lives with husband [name] lives with them as do his 2 children   Daughter, [name], and her 3 children live in [place] -  [name] is incarcerated   Nephew, Myia Bergh lives with them   Daughter, [name], in [facility] [name] [address]   Moved June 2013 to a better home on [facility].      Lives with husband, son, grandson, granddaughter, nephew.   Social Determinants of Health   Financial Resource Strain: Medium Risk  . Difficulty of Paying Living Expenses: Somewhat hard  Food Insecurity: No Food Insecurity  . Worried About Programme researcher, broadcasting/film/video in the Last Year: Never true  . Ran Out of Food in the Last Year: Never true  Transportation Needs: No Transportation Needs  . Lack of Transportation (Medical): No  . Lack of Transportation (Non-Medical): No  Physical Activity: Not on file  Stress: Not on file  Social Connections: Not on file  Intimate Partner Violence: Not on file    Physical Exam      Future Appointments  Date Time Provider  Department Center  04/09/2021  7:55 AM [facility] DEVICE REMOTES [name]  05/10/2021  7:55 AM [facility] DEVICE REMOTES [name]  05/30/2021  1:40 PM [name], [name], MD [facility] None  06/11/2021  7:55 AM [facility] DEVICE REMOTES [name]    BP 112/80   Pulse 76   Resp 18   Wt 170 lb (77.1 kg)   SpO2 98%   BMI 36.79 kg/m   Weight yesterday-172 Last visit weight-171  Pt reports she is doing ok, she went to PCP last week and she needs unna boots-she was referred out to [facility] for those. I have asked her to be sure to answer any phone calls she gets as it may be the nurses for the unna boots.  Pt denies increased sob, no dizziness, no c/p. She did miss one day of her meds. She took this morning med dose during our visit.  I  filled 2 pill boxes for her as I will be out of the office next week. Will f/u week after.   Kerry Hough, EMT-Paramedic [phone] [facility] Paramedic  04/03/21

## 2021-04-02 NOTE — Telephone Encounter (Signed)
-----   Message from Kerry Hough, EMT sent at 04/02/2021  8:08 AM EDT ----- Good morning,   Unfortunately we do not do wound care/unna boots so she would need home health nurse for this.  So with that, she is not the best at answering phone calls, could you relay that to the nursing company?     ----- Message ----- From: Latrelle Dodrill, MD Sent: 04/01/2021  11:56 PM EDT To: Kerry Hough, EMT  Hi Katie,  I am Misty Gray's PCP. I saw her last week and she has what appears to be venous stasis dermatitis on both of her legs. Since you go out to her home, would it be possible for you to apply unna boots to her legs and change them regularly? Just curious if this is something you could help with or if I should get home health involved.  Thanks! Latrelle Dodrill, MD

## 2021-04-03 NOTE — Telephone Encounter (Signed)
Denied, see info below:   Dietary supplements, including prescription vitamins and mineral products (except prenatal vitamins and fluoride vitamins for children), health and beauty aids, herbal supplements and/or alternative medicines are excluded from coverage under Medicare rules. Please refer to your Evidence of Coverage (EOC) section that references Part D drug coverage in your pharmacy plan documents for more information.  Jone Baseman, CMA

## 2021-04-03 NOTE — Telephone Encounter (Signed)
Please let patient know she can buy this over the counter. Thanks Latrelle Dodrill, MD

## 2021-04-04 NOTE — Telephone Encounter (Signed)
LMOVM informing patient. Misty Gray, CMA ? ?

## 2021-04-16 ENCOUNTER — Other Ambulatory Visit (HOSPITAL_COMMUNITY): Payer: Self-pay

## 2021-04-16 NOTE — Progress Notes (Signed)
Paramedicine Encounter    Patient ID: Misty Gray, female    DOB: 1951/01/11, 70 y.o.   MRN: [id number]   Patient Care Team: Latrelle Dodrill, MD as PCP - General (Family Medicine) [name], MD (Cardiology) Burna Sis, LCSW as Social Worker (Licensed Clinical Social Worker)  Patient Active Problem List   Diagnosis Date Noted  . Hand injury, left, initial encounter 09/22/2020  . CHF exacerbation (HCC) 07/12/2020  . Hospital discharge follow-up 11/17/2019  . Onychomycosis 11/17/2019  . Abdominal wall hernia 11/05/2017  . Anticoagulated 11/05/2017  . Rash and nonspecific skin eruption 09/15/2017  . Ventral hernia without obstruction or gangrene 08/15/2017  . Skin irritation 07/23/2017  . Chronic venous stasis dermatitis 12/26/2016  . Urinary tract infection without hematuria   . Frequent PVCs   . Hypokalemia   . Housing problems 11/23/2016  . Chronic atrial fibrillation (HCC) 11/01/2016  . Hypotension 10/31/2016  . Healthcare maintenance 08/12/2016  . Heme positive stool 08/09/2016  . Intertrigo 08/09/2016  . Chronic anticoagulation 04/26/2016  . Chronic venous insufficiency   . Breast mass, left 10/05/2014  . Osteopenia 09/15/2014  . Hypertension 01/04/2014  . Chronic systolic dysfunction of left ventricle 05/05/2013  . At high risk for falls 02/11/2013  . Vitamin D deficiency 07/15/2012  . Sinoatrial node dysfunction (HCC) 05/08/2011  . Pacemaker 08/10/2009  . Allergic rhinitis 05/31/2008  . Morbid obesity (HCC) 02/12/2007  . GASTROESOPHAGEAL REFLUX, NO ESOPHAGITIS 02/12/2007  . Female stress incontinence 02/12/2007  . Seizure disorder (HCC) 02/12/2007  . OSA (obstructive sleep apnea) 02/12/2007    Current Outpatient Medications:  .  amiodarone (PACERONE) 200 MG tablet, Take 0.5 tablets (100 mg total) by mouth daily., Disp: 45 tablet, Rfl: 3 .  cholecalciferol (VITAMIN D) 25 MCG (1000 UNIT) tablet, Take 1 tablet (1,000 Units total) by mouth daily., Disp:  28 tablet, Rfl: 1 .  edoxaban (SAVAYSA) 60 MG TABS tablet, Take 60 mg by mouth daily., Disp: 30 tablet, Rfl: 11 .  losartan (COZAAR) 25 MG tablet, Take 0.5 tablets (12.5 mg total) by mouth daily., Disp: 15 tablet, Rfl: 6 .  omeprazole (PRILOSEC) 20 MG capsule, TAKE (1) CAPSULE BY MOUTH ONCE DAILY., Disp: 28 capsule, Rfl: 1 .  PHENobarbital (LUMINAL) 64.8 MG tablet, Take 1 tablet (64.8 mg total) by mouth 2 (two) times daily., Disp: 56 tablet, Rfl: 1 .  potassium chloride (KLOR-CON) 10 MEQ tablet, TAKE 4 TABLETS BY MOUTH THREE TIMES DAILY (MORNING, NOON, AND NIGHT), Disp: 360 tablet, Rfl: 6 .  spironolactone (ALDACTONE) 25 MG tablet, Take 1 tablet (25 mg total) by mouth daily., Disp: 30 tablet, Rfl: 6 .  torsemide (DEMADEX) 20 MG tablet, Take 4 tablets (80 mg total) by mouth in the morning AND 3 tablets (60 mg total) every evening., Disp: 210 tablet, Rfl: 6 .  fluticasone (FLONASE) 50 MCG/ACT nasal spray, Place 2 sprays into both nostrils daily as needed. For congestion. (Patient not taking: No sig reported), Disp: 16 g, Rfl: 1 .  HYDROcodone-acetaminophen (NORCO) 5-325 MG tablet, Take 1-2 tablets by mouth every 6 (six) hours as needed. (Patient not taking: No sig reported), Disp: 15 tablet, Rfl: 0 Allergies  Allergen Reactions  . Aspirin Hives and Rash  . Penicillins Rash and Other (See Comments)    Has patient had a PCN reaction causing immediate rash, facial/tongue/throat swelling, SOB or lightheadedness with hypotension: Yes Has patient had a PCN reaction causing severe rash involving mucus membranes or skin necrosis: No Has patient had a PCN  reaction that required hospitalization No Has patient had a PCN reaction occurring within the last 10 years: Yes If all of the above answers are "NO", then may proceed with Cephalosporin use.  Patient has tolerated cephalosporins in 2017.  . Tape Other (See Comments)    NO PAPER TAPE-MUST BE MEDICAL TAPE - unknown reaction NO PAPER TAPE-MUST BE MEDICAL  TAPE - unknown reaction  . Wool Alcohol [Lanolin] Hives  . Amoxicillin Rash    Patient has tolerated cephalosporins  . Ampicillin Itching and Rash    Patient has tolerated cephalosporins      Social History   Socioeconomic History  . Marital status: Married    Spouse name: [name]  . Number of children: 4  . Years of education: 80  . Highest education level: Not on file  Occupational History  . Occupation: disabled  Tobacco Use  . Smoking status: Former Smoker    Packs/day: 1.00    Years: 7.00    Pack years: 7.00    Types: Cigarettes    Quit date: 03/16/1969    Years since quitting: 52.1  . Smokeless tobacco: Never Used  Vaping Use  . Vaping Use: Never used  Substance and Sexual Activity  . Alcohol use: No  . Drug use: No  . Sexual activity: Not Currently    Birth control/protection: Post-menopausal  Other Topics Concern  . Not on file  Social History Narrative   Lives with husband [name] lives with them as do his 2 children   Daughter, [name], and her 3 children live in [place] -  [name] is incarcerated   Nephew, Riann Oman lives with them   Daughter, [name], in [name] [address]   Moved June 2013 to a better home on [facility].      Lives with husband, son, grandson, granddaughter, nephew.   Social Determinants of Health   Financial Resource Strain: Medium Risk  . Difficulty of Paying Living Expenses: Somewhat hard  Food Insecurity: No Food Insecurity  . Worried About Programme researcher, broadcasting/film/video in the Last Year: Never true  . Ran Out of Food in the Last Year: Never true  Transportation Needs: No Transportation Needs  . Lack of Transportation (Medical): No  . Lack of Transportation (Non-Medical): No  Physical Activity: Not on file  Stress: Not on file  Social Connections: Not on file  Intimate Partner Violence: Not on file    Physical Exam      Future Appointments  Date Time Provider  Department Center  05/10/2021  7:55 AM [facility] DEVICE REMOTES [name]  05/30/2021  1:40 PM [name], [name], MD [facility] None  06/11/2021  7:55 AM [facility] DEVICE REMOTES [name]    BP 104/64   Pulse 72   Resp 18   Wt 175 lb (79.4 kg)   SpO2 98%   BMI 37.87 kg/m   Weight yesterday-173 Last visit weight-170  Pt reports she is doing ok. Pt denies increased sob. No dizziness. She has not heard from [facility] yet, however she said her phone doesn't always pick up callers and I dont think her VM is set up for her to check messages.  No missed doses of her meds. I filled up 2 weeks worth of pill boxes for her. Her weight is up but she doesn't feel overloaded-her legs are still reddened with slight edema noted. She reports her son fixed lunch yesterday and  it tasted very salty to her so that likely explains her weight gain.  I messaged her PCP that ordered the [facility] and see if they have reached out to them stating the agency wasn't able to reach her--she is difficult to reach. --looks like due to short staffing agencies have not been able to get to her. PCP is waiting to hear back from [facility] to see if they can take her. Will keep check on it and if her legs worsen they I will reach out to her PCP to get her in office.    Kerry Hough, EMT-Paramedic [phone] [facility] Paramedic  04/16/21

## 2021-04-20 ENCOUNTER — Telehealth: Payer: Self-pay | Admitting: Family Medicine

## 2021-04-20 NOTE — Telephone Encounter (Signed)
Called patient to discuss her leg swelling since we have not been able to find a [facility] agency that can see her, due to insurance issues and agencies being understaffed.  Offered to have her come to our clinic for weekly unna boots. She reports her legs are the same as they always are and don't really bother her that much.  Advised she let me know if that changes and we can schedule her here. For now, will just monitor.  FYI to EMT Kerry Hough who visits patient at home.  Latrelle Dodrill, MD

## 2021-04-24 ENCOUNTER — Other Ambulatory Visit: Payer: Self-pay | Admitting: *Deleted

## 2021-04-25 MED ORDER — VITAMIN D3 25 MCG (1000 UNIT) PO TABS
1000.0000 [IU] | ORAL_TABLET | Freq: Every day | ORAL | 1 refills | Status: DC
Start: 2021-04-25 — End: 2021-07-25

## 2021-04-25 MED ORDER — OMEPRAZOLE 20 MG PO CPDR
DELAYED_RELEASE_CAPSULE | ORAL | 1 refills | Status: DC
Start: 2021-04-25 — End: 2021-04-30

## 2021-04-26 ENCOUNTER — Other Ambulatory Visit: Payer: Self-pay | Admitting: Family Medicine

## 2021-04-30 ENCOUNTER — Other Ambulatory Visit (HOSPITAL_COMMUNITY): Payer: Self-pay

## 2021-04-30 NOTE — Progress Notes (Signed)
Paramedicine Encounter    Patient ID: Misty Gray, female    DOB: 20-Nov-1951, 70 y.o.   MRN: [id number]   Patient Care Team: Latrelle Dodrill, MD as PCP - General (Family Medicine) [name], MD (Cardiology) Burna Sis, LCSW as Social Worker (Licensed Clinical Social Worker)  Patient Active Problem List   Diagnosis Date Noted  . Hand injury, left, initial encounter 09/22/2020  . CHF exacerbation (HCC) 07/12/2020  . Hospital discharge follow-up 11/17/2019  . Onychomycosis 11/17/2019  . Abdominal wall hernia 11/05/2017  . Anticoagulated 11/05/2017  . Rash and nonspecific skin eruption 09/15/2017  . Ventral hernia without obstruction or gangrene 08/15/2017  . Skin irritation 07/23/2017  . Chronic venous stasis dermatitis 12/26/2016  . Urinary tract infection without hematuria   . Frequent PVCs   . Hypokalemia   . Housing problems 11/23/2016  . Chronic atrial fibrillation (HCC) 11/01/2016  . Hypotension 10/31/2016  . Healthcare maintenance 08/12/2016  . Heme positive stool 08/09/2016  . Intertrigo 08/09/2016  . Chronic anticoagulation 04/26/2016  . Chronic venous insufficiency   . Breast mass, left 10/05/2014  . Osteopenia 09/15/2014  . Hypertension 01/04/2014  . Chronic systolic dysfunction of left ventricle 05/05/2013  . At high risk for falls 02/11/2013  . Vitamin D deficiency 07/15/2012  . Sinoatrial node dysfunction (HCC) 05/08/2011  . Pacemaker 08/10/2009  . Allergic rhinitis 05/31/2008  . Morbid obesity (HCC) 02/12/2007  . GASTROESOPHAGEAL REFLUX, NO ESOPHAGITIS 02/12/2007  . Female stress incontinence 02/12/2007  . Seizure disorder (HCC) 02/12/2007  . OSA (obstructive sleep apnea) 02/12/2007    Current Outpatient Medications:  .  amiodarone (PACERONE) 200 MG tablet, Take 0.5 tablets (100 mg total) by mouth daily., Disp: 45 tablet, Rfl: 3 .  cholecalciferol (VITAMIN D) 25 MCG (1000 UNIT) tablet, Take 1 tablet (1,000 Units total) by mouth daily., Disp:  28 tablet, Rfl: 1 .  edoxaban (SAVAYSA) 60 MG TABS tablet, Take 60 mg by mouth daily., Disp: 30 tablet, Rfl: 11 .  fluticasone (FLONASE) 50 MCG/ACT nasal spray, Place 2 sprays into both nostrils daily as needed. For congestion., Disp: 16 g, Rfl: 1 .  losartan (COZAAR) 25 MG tablet, Take 0.5 tablets (12.5 mg total) by mouth daily., Disp: 15 tablet, Rfl: 6 .  omeprazole (PRILOSEC) 20 MG capsule, TAKE ONE CAPSULE BY MOUTH EVERY MORNING, Disp: 30 capsule, Rfl: 2 .  PHENobarbital (LUMINAL) 64.8 MG tablet, Take 1 tablet (64.8 mg total) by mouth 2 (two) times daily., Disp: 56 tablet, Rfl: 1 .  potassium chloride (KLOR-CON) 10 MEQ tablet, TAKE 4 TABLETS BY MOUTH THREE TIMES DAILY (MORNING, NOON, AND NIGHT), Disp: 360 tablet, Rfl: 6 .  spironolactone (ALDACTONE) 25 MG tablet, Take 1 tablet (25 mg total) by mouth daily., Disp: 30 tablet, Rfl: 6 .  torsemide (DEMADEX) 20 MG tablet, Take 4 tablets (80 mg total) by mouth in the morning AND 3 tablets (60 mg total) every evening., Disp: 210 tablet, Rfl: 6 .  HYDROcodone-acetaminophen (NORCO) 5-325 MG tablet, Take 1-2 tablets by mouth every 6 (six) hours as needed. (Patient not taking: Reported on 04/30/2021), Disp: 15 tablet, Rfl: 0 Allergies  Allergen Reactions  . Aspirin Hives and Rash  . Penicillins Rash and Other (See Comments)    Has patient had a PCN reaction causing immediate rash, facial/tongue/throat swelling, SOB or lightheadedness with hypotension: Yes Has patient had a PCN reaction causing severe rash involving mucus membranes or skin necrosis: No Has patient had a PCN reaction that required hospitalization No Has  patient had a PCN reaction occurring within the last 10 years: Yes If all of the above answers are "NO", then may proceed with Cephalosporin use.  Patient has tolerated cephalosporins in 2017.  . Tape Other (See Comments)    NO PAPER TAPE-MUST BE MEDICAL TAPE - unknown reaction NO PAPER TAPE-MUST BE MEDICAL TAPE - unknown reaction  .  Wool Alcohol [Lanolin] Hives  . Amoxicillin Rash    Patient has tolerated cephalosporins  . Ampicillin Itching and Rash    Patient has tolerated cephalosporins      Social History   Socioeconomic History  . Marital status: Married    Spouse name: [name]  . Number of children: 4  . Years of education: 60  . Highest education level: Not on file  Occupational History  . Occupation: disabled  Tobacco Use  . Smoking status: Former Smoker    Packs/day: 1.00    Years: 7.00    Pack years: 7.00    Types: Cigarettes    Quit date: 03/16/1969    Years since quitting: 52.1  . Smokeless tobacco: Never Used  Vaping Use  . Vaping Use: Never used  Substance and Sexual Activity  . Alcohol use: No  . Drug use: No  . Sexual activity: Not Currently    Birth control/protection: Post-menopausal  Other Topics Concern  . Not on file  Social History Narrative   Lives with husband [name] lives with them as do his 2 children   Daughter, [name], and her 3 children live in [place] -  [name] is incarcerated   Nephew, Marycarmen Hagey lives with them   Daughter, [name], in [name] [address]   Moved June 2013 to a better home on [facility].      Lives with husband, son, grandson, granddaughter, nephew.   Social Determinants of Health   Financial Resource Strain: Medium Risk  . Difficulty of Paying Living Expenses: Somewhat hard  Food Insecurity: No Food Insecurity  . Worried About Programme researcher, broadcasting/film/video in the Last Year: Never true  . Ran Out of Food in the Last Year: Never true  Transportation Needs: No Transportation Needs  . Lack of Transportation (Medical): No  . Lack of Transportation (Non-Medical): No  Physical Activity: Not on file  Stress: Not on file  Social Connections: Not on file  Intimate Partner Violence: Not on file    Physical Exam      Future Appointments  Date Time Provider Department Center  05/10/2021   7:55 AM [facility] DEVICE REMOTES [name]  05/30/2021  1:40 PM [name], [name], MD [facility] None  06/11/2021  7:55 AM [facility] DEVICE REMOTES [name]    BP 108/64   Pulse 66   Resp 18   Wt 172 lb (78 kg)   SpO2 97%   BMI 37.22 kg/m   Weight yesterday-172 Last visit weight-175  Pt reports she is feeling ok. She denies sob, no dizziness, no c/p. Legs still look red and swollen but she reports they look a little better than before. I advised her should they get worse to contact her PCP for evaluation. She said she would.  Her pill dosing packs will be delivered tomor.-I confirmed that with pharmacy.  I will come back out tomor to verify her meds and to show her how to use them as they will be different from what she had before.  V/s as noted.  Weight stable. Will see her tomor.   Kerry Hough, EMT-Paramedic [phone] [facility] Paramedic  04/30/21

## 2021-05-01 ENCOUNTER — Other Ambulatory Visit (HOSPITAL_COMMUNITY): Payer: Self-pay

## 2021-05-01 NOTE — Progress Notes (Signed)
Came out today for med review post pharmacy med delivery to show her the pill dosing packs--they will not be able to place the phenobarbital in there due to the rx is for 28 days and not 30 days-the amio was not in there-too early to fill-- Plus there were numerous packs for her to tear off due to the limited amount of pills in each pull off tab so we are [facility] do the bubble packs that is much more user friendly.   Will see her in 2 wks.   Kerry Hough, EMT-Paramedic  05/01/21

## 2021-05-02 ENCOUNTER — Other Ambulatory Visit: Payer: Self-pay

## 2021-05-02 ENCOUNTER — Emergency Department (HOSPITAL_COMMUNITY): Payer: Medicare Other

## 2021-05-02 ENCOUNTER — Encounter (HOSPITAL_COMMUNITY): Payer: Self-pay

## 2021-05-02 ENCOUNTER — Emergency Department (HOSPITAL_COMMUNITY)
Admission: EM | Admit: 2021-05-02 | Discharge: 2021-05-03 | Disposition: A | Discharge status: Home / Self Care | Payer: Medicare Other | Attending: Emergency Medicine | Admitting: Emergency Medicine

## 2021-05-02 DIAGNOSIS — Z87891 Personal history of nicotine dependence: Secondary | ICD-10-CM | POA: Insufficient documentation

## 2021-05-02 DIAGNOSIS — Y9389 Activity, other specified: Secondary | ICD-10-CM | POA: Diagnosis not present

## 2021-05-02 DIAGNOSIS — Z79899 Other long term (current) drug therapy: Secondary | ICD-10-CM | POA: Insufficient documentation

## 2021-05-02 DIAGNOSIS — S300XXA Contusion of lower back and pelvis, initial encounter: Secondary | ICD-10-CM

## 2021-05-02 DIAGNOSIS — I110 Hypertensive heart disease with heart failure: Secondary | ICD-10-CM | POA: Diagnosis not present

## 2021-05-02 DIAGNOSIS — I509 Heart failure, unspecified: Secondary | ICD-10-CM | POA: Diagnosis not present

## 2021-05-02 DIAGNOSIS — Z950 Presence of cardiac pacemaker: Secondary | ICD-10-CM | POA: Insufficient documentation

## 2021-05-02 DIAGNOSIS — M545 Low back pain, unspecified: Secondary | ICD-10-CM | POA: Diagnosis not present

## 2021-05-02 DIAGNOSIS — M533 Sacrococcygeal disorders, not elsewhere classified: Secondary | ICD-10-CM | POA: Diagnosis not present

## 2021-05-02 DIAGNOSIS — S0990XA Unspecified injury of head, initial encounter: Secondary | ICD-10-CM | POA: Diagnosis not present

## 2021-05-02 DIAGNOSIS — D689 Coagulation defect, unspecified: Secondary | ICD-10-CM | POA: Insufficient documentation

## 2021-05-02 DIAGNOSIS — Z043 Encounter for examination and observation following other accident: Secondary | ICD-10-CM | POA: Diagnosis not present

## 2021-05-02 DIAGNOSIS — W19XXXA Unspecified fall, initial encounter: Secondary | ICD-10-CM

## 2021-05-02 DIAGNOSIS — W010XXA Fall on same level from slipping, tripping and stumbling without subsequent striking against object, initial encounter: Secondary | ICD-10-CM | POA: Insufficient documentation

## 2021-05-02 DIAGNOSIS — D684 Acquired coagulation factor deficiency: Secondary | ICD-10-CM | POA: Diagnosis not present

## 2021-05-02 NOTE — ED Provider Notes (Signed)
MSE was initiated and I personally evaluated the patient and placed orders (if any) at  10:41 PM on May 02, 2021.  Patient on anticoagulants fell last night after tripping on her dogs, hitting tail bone with bruising and persistent pain. Unable to get up for 3 hours. Larey Seat again today after tripping on her foot stool, hitting her right hand. Complains of tail bone pain, left neck soreness, right hand soreness.   Today's Vitals   05/02/21 2235  BP: 108/90  Pulse: 78  Resp: 18  Temp: 98.2 F (36.8 C)  SpO2: 99%   There is no height or weight on file to calculate BMI.  Head atraumatic No cervical midline tenderness, +left lateral neck tenderness No chest soreness No abdominal soreness  *unable to view lower back in triage.  The patient appears stable so that the remainder of the MSE may be completed by another provider.   Elpidio Anis, PA-C 05/02/21 2243    Bethann Berkshire, MD 05/04/21 1550

## 2021-05-02 NOTE — ED Triage Notes (Addendum)
Pt stated she fell last night and was on the floor for 3 hours. Pt states today she fell on a stool at approx 2130. Pt states she takes her blood thinners as prescribed. Pt A&Ox4. Pt states her only complaint is her tailbone hurts. Pt denies hitting her head, denies LOC.

## 2021-05-03 MED ORDER — HYDROCODONE-ACETAMINOPHEN 5-325 MG PO TABS: 1.0000 | ORAL_TABLET | ORAL | 0 refills | Status: DC | PRN

## 2021-05-03 MED ORDER — OXYCODONE-ACETAMINOPHEN 5-325 MG PO TABS
1.0000 | ORAL_TABLET | Freq: Once | ORAL | Status: AC
  Administered 2021-05-03: 1 via ORAL
  Filled 2021-05-03: qty 1

## 2021-05-03 NOTE — Discharge Instructions (Addendum)
Follow up with your doctor as needed to pain control.   Take Norco as directed for pain. Return to the ED if symptoms worsen or for new concern.

## 2021-05-03 NOTE — ED Notes (Signed)
Assisted pt to restroom with use of wheelchair.

## 2021-05-03 NOTE — ED Provider Notes (Signed)
[facility]-EMERGENCY DEPT Provider Note   CSN: [id number] Arrival date & time: 05/02/21  2220     History Chief Complaint  Patient presents with  . Fall    Misty Gray is a 70 y.o. female.  Patient with history of NICM, HTN, pacemaker, CHF, atrial fibrillation on anticoagulants, seizure disorder, arthritis, CVA presents after fall yesterday. She reports trying to break up a fight between her dogs and fell over one of them. She landed in a sitting position and complains of "tail bone" pain. Tonight, she tripped over her foot stool causing another fall, hitting her right hand on furniture. She denies head injury, LOC, dizziness. No nausea. She reports left sided neck soreness and mild left shoulder soreness. No chest pain or abdominal pain.  The history is provided by the patient. No language interpreter was used.  Fall Pertinent negatives include no chest pain, no abdominal pain, no headaches and no shortness of breath.       Past Medical History:  Diagnosis Date  . Abnormal CT of the head 12/16/1984   R parietal atrophy  . Allergic rhinitis, cause unspecified   . Altered mental status 11/28/2016  . Anemia   . Arthritis    "hands and knees" (09/06/2015)  . Atrial fibrillation (HCC)   . Cellulitis 09/07/2015  . Cellulitis and abscess of leg 09/2016   bilateral  . CHF (congestive heart failure) (HCC)   . Diaphragmatic hernia without mention of obstruction or gangrene   . Dyspnea   . Exertional dyspnea 06/22/12  . Family history of adverse reaction to anesthesia    "daughter fights w/them; just can't relax during OR; stay awake during the OR"  . Fatigue 06/22/2012  . Female stress incontinence   . GERD (gastroesophageal reflux disease)   . Grand mal seizure (HCC)   . H/O hiatal hernia    two removed  . Heart murmur   . High cholesterol   . History of blood transfusion 1956   "related to nose bleed"  . Hypertension   . Influenza A 01/11/2014  .  Lichenification and lichen simplex chronicus   . Migraine    "when I was a teenager"  . Morbid obesity (HCC)   . Nonischemic cardiomyopathy (HCC)    EF has normalized-repeat Pending   . On home oxygen therapy    "2L when I'm asleep in bed" (09/06/2015)  . OSA on CPAP   . Pacemaker  st judes    Patient states "this is my third pacemaker"  . Pneumonia 2004; 2011; 11/2011  . Seizures (HCC) since 1981   "can hear you talking but sounds like you are in big tunnel; have them often if not taking RX; last one was 06/17/12" (09/06/2015)  . Shortness of breath 10/24/2010   Qualifier: Diagnosis of  By: [place], Bonnie    . Sick sinus syndrome (HCC)    WITH PRIOR DDD PACEMAKER IMPLANTATION  . Somnolence   . Stroke [facility]) 1988   "mouth drawed real bad on my left side; found out it was a seizure"  . Syncope and collapse 06/22/12   "hit forehead and left knee"; denies loss of consciousness  . Unspecified venous (peripheral) insufficiency     Patient Active Problem List   Diagnosis Date Noted  . Hand injury, left, initial encounter 09/22/2020  . CHF exacerbation (HCC) 07/12/2020  . Hospital discharge follow-up 11/17/2019  . Onychomycosis 11/17/2019  . Abdominal wall hernia 11/05/2017  . Anticoagulated 11/05/2017  .  Rash and nonspecific skin eruption 09/15/2017  . Ventral hernia without obstruction or gangrene 08/15/2017  . Skin irritation 07/23/2017  . Chronic venous stasis dermatitis 12/26/2016  . Urinary tract infection without hematuria   . Frequent PVCs   . Hypokalemia   . Housing problems 11/23/2016  . Chronic atrial fibrillation (HCC) 11/01/2016  . Hypotension 10/31/2016  . Healthcare maintenance 08/12/2016  . Heme positive stool 08/09/2016  . Intertrigo 08/09/2016  . Chronic anticoagulation 04/26/2016  . Chronic venous insufficiency   . Breast mass, left 10/05/2014  . Osteopenia 09/15/2014  . Hypertension 01/04/2014  . Chronic systolic dysfunction of left ventricle 05/05/2013  . At  high risk for falls 02/11/2013  . Vitamin D deficiency 07/15/2012  . Sinoatrial node dysfunction (HCC) 05/08/2011  . Pacemaker 08/10/2009  . Allergic rhinitis 05/31/2008  . Morbid obesity (HCC) 02/12/2007  . GASTROESOPHAGEAL REFLUX, NO ESOPHAGITIS 02/12/2007  . Female stress incontinence 02/12/2007  . Seizure disorder (HCC) 02/12/2007  . OSA (obstructive sleep apnea) 02/12/2007    Past Surgical History:  Procedure Laterality Date  . APPENDECTOMY    . CARDIAC CATHETERIZATION N/A 10/03/2016   Procedure: Right/Left Heart Cath and Coronary Angiography;  Surgeon: Dolores Patty, MD;  Location: [facility] INVASIVE CV LAB;  Service: Cardiovascular;  Laterality: N/A;  . HERNIA REPAIR  ? 1988; 04/15/1998  . INSERT / REPLACE / REMOVE PACEMAKER  12/16/1990   DDD EF 30%;  Marland Kitchen INSERT / REPLACE / REMOVE PACEMAKER  07/25/2003   replaced by BB, leads are both IS1 and from 1992  . INSERT / REPLACE / REMOVE PACEMAKER  05/21/2011   JA; generator change  . LEG SKIN LESION  BIOPSY / EXCISION  05/16/2001   Lichen Planus  . VENTRAL HERNIA REPAIR  1989; 04/15/1998     OB History   No obstetric history on file.     Family History  Problem Relation Age of Onset  . Stroke Father        died from CAD?  Marland Kitchen Heart disease Father   . Cancer Mother   . Diabetes Son        Type 1  . Sleep apnea Son   . Cancer Sister        cervical  . Hypertension Sister   . Hypertension Brother   . Rheum arthritis Daughter        Also PGM, PGGM    Social History   Tobacco Use  . Smoking status: Former Smoker    Packs/day: 1.00    Years: 7.00    Pack years: 7.00    Types: Cigarettes    Quit date: 03/16/1969    Years since quitting: 52.1  . Smokeless tobacco: Never Used  Vaping Use  . Vaping Use: Never used  Substance Use Topics  . Alcohol use: No  . Drug use: No    Home Medications Prior to Admission medications   Medication Sig Start Date End Date Taking? Authorizing Provider  amiodarone (PACERONE) 200 MG tablet  Take 0.5 tablets (100 mg total) by mouth daily. 02/22/21   Jacklynn Ganong, FNP  cholecalciferol (VITAMIN D) 25 MCG (1000 UNIT) tablet Take 1 tablet (1,000 Units total) by mouth daily. 04/25/21   Latrelle Dodrill, MD  edoxaban (SAVAYSA) 60 MG TABS tablet Take 60 mg by mouth daily. 02/22/21   Milford, Anderson Malta, FNP  fluticasone (FLONASE) 50 MCG/ACT nasal spray Place 2 sprays into both nostrils daily as needed. For congestion. 07/28/17   Latrelle Dodrill, MD  HYDROcodone-acetaminophen (NORCO) 5-325 MG tablet Take 1-2 tablets by mouth every 6 (six) hours as needed. Patient not taking: Reported on 04/30/2021 11/22/20   Geoffery Lyons, MD  losartan (COZAAR) 25 MG tablet Take 0.5 tablets (12.5 mg total) by mouth daily. 03/13/21   [name], [name], MD  omeprazole (PRILOSEC) 20 MG capsule TAKE ONE CAPSULE BY MOUTH EVERY MORNING 04/30/21   Latrelle Dodrill, MD  PHENobarbital (LUMINAL) 64.8 MG tablet Take 1 tablet (64.8 mg total) by mouth 2 (two) times daily. 03/19/21   Latrelle Dodrill, MD  potassium chloride (KLOR-CON) 10 MEQ tablet TAKE 4 TABLETS BY MOUTH THREE TIMES DAILY (MORNING, NOON, AND NIGHT) 12/29/20   [name], [name], MD  spironolactone (ALDACTONE) 25 MG tablet Take 1 tablet (25 mg total) by mouth daily. 12/29/20   [name], [name], MD  torsemide (DEMADEX) 20 MG tablet Take 4 tablets (80 mg total) by mouth in the morning AND 3 tablets (60 mg total) every evening. 12/29/20   [name], [name], MD    Allergies    Aspirin, Penicillins, Tape, Wool alcohol [lanolin], Amoxicillin, and Ampicillin  Review of Systems   Review of Systems  Constitutional: Negative for chills and fever.  HENT: Negative.   Respiratory: Negative.  Negative for shortness of breath.   Cardiovascular: Negative.  Negative for chest pain.  Gastrointestinal: Negative.  Negative for abdominal pain.  Musculoskeletal: Positive for back pain ("Tail bone pain") and neck pain (Lateral neck soreness).       See  HPI.  Skin: Positive for color change (Mild bruising of right hand). Negative for wound.  Neurological: Negative.  Negative for dizziness, syncope, weakness and headaches.    Physical Exam Updated Vital Signs BP (!) 110/50 (BP Location: Left Arm)   Pulse 60   Temp 98.2 F (36.8 C)   Resp 18   SpO2 99%   Physical Exam Vitals and nursing note reviewed.  Constitutional:      Appearance: She is well-developed.  HENT:     Head: Normocephalic.  Cardiovascular:     Rate and Rhythm: Normal rate and regular rhythm.  Pulmonary:     Effort: Pulmonary effort is normal.     Breath sounds: Normal breath sounds.  Abdominal:     General: Bowel sounds are normal.     Palpations: Abdomen is soft.     Tenderness: There is no abdominal tenderness. There is no guarding or rebound.  Musculoskeletal:        General: Normal range of motion.     Cervical back: Normal range of motion and neck supple.     Comments: Tender to low back in midline. No swelling or stepoff.  No midline cervical tenderness. FROM left shoulder. FROM all joint of right hand.   Skin:    General: Skin is warm and dry.     Comments: Bruising to dorsal right hand without deformity.   Neurological:     Mental Status: She is alert and oriented to person, place, and time.     ED Results / Procedures / Treatments   Labs (all labs ordered are listed, but only abnormal results are displayed) Labs Reviewed  URINALYSIS, ROUTINE W REFLEX MICROSCOPIC    EKG None  Radiology DG Lumbar Spine 2-3 Views  Result Date: 05/02/2021 CLINICAL DATA:  Recent fall with low back pain, initial encounter EXAM: LUMBAR SPINE - 3 VIEW COMPARISON:  09/29/2019 FINDINGS: Five lumbar type vertebral bodies are well visualized. Vertebral body height is well maintained. Disc  space narrowing is noted at L4-5 and L5-S1. Facet hypertrophic changes are noted. Multiple ingested tablets are noted stomach. IMPRESSION: Multilevel degenerative change without  acute abnormality. Electronically Signed   By: Alcide Clever M.D.   On: 05/02/2021 23:31   DG Sacrum/Coccyx  Result Date: 05/02/2021 CLINICAL DATA:  Recent fall with pelvic pain, initial encounter EXAM: SACRUM AND COCCYX - 2+ VIEW COMPARISON:  None. FINDINGS: Pelvic ring is intact. Degenerative changes of the hip joints are noted. No sacral fracture is seen. Lumbar degenerative changes are noted as well. IMPRESSION: No acute abnormality noted. Electronically Signed   By: Alcide Clever M.D.   On: 05/02/2021 23:30   CT Head Wo Contrast  Result Date: 05/02/2021 CLINICAL DATA:  Fall EXAM: CT HEAD WITHOUT CONTRAST CT CERVICAL SPINE WITHOUT CONTRAST TECHNIQUE: Multidetector CT imaging of the head and cervical spine was performed following the standard protocol without intravenous contrast. Multiplanar CT image reconstructions of the cervical spine were also generated. COMPARISON:  None. FINDINGS: CT HEAD FINDINGS Brain: There is no mass, hemorrhage or extra-axial collection. There is generalized atrophy without lobar predilection. There is periventricular hypoattenuation compatible with chronic microvascular disease. There are old right frontal and parietal infarcts. Vascular: No abnormal hyperdensity of the major intracranial arteries or dural venous sinuses. No intracranial atherosclerosis. Skull: The visualized skull base, calvarium and extracranial soft tissues are normal. Sinuses/Orbits: No fluid levels or advanced mucosal thickening of the visualized paranasal sinuses. No mastoid or middle ear effusion. The orbits are normal. CT CERVICAL SPINE FINDINGS Alignment: No static subluxation. Facets are aligned. Occipital condyles are normally positioned. Skull base and vertebrae: No acute fracture. Soft tissues and spinal canal: No prevertebral fluid or swelling. No visible canal hematoma. Disc levels: No advanced spinal canal or neural foraminal stenosis. Upper chest: No pneumothorax, pulmonary nodule or pleural  effusion. Other: Normal visualized paraspinal cervical soft tissues. IMPRESSION: 1. Old right frontal and parietal infarcts without acute intracranial abnormality. 2. No acute fracture or static subluxation of the cervical spine. Electronically Signed   By: Deatra Robinson M.D.   On: 05/02/2021 23:30   CT Cervical Spine Wo Contrast  Result Date: 05/02/2021 CLINICAL DATA:  Fall EXAM: CT HEAD WITHOUT CONTRAST CT CERVICAL SPINE WITHOUT CONTRAST TECHNIQUE: Multidetector CT imaging of the head and cervical spine was performed following the standard protocol without intravenous contrast. Multiplanar CT image reconstructions of the cervical spine were also generated. COMPARISON:  None. FINDINGS: CT HEAD FINDINGS Brain: There is no mass, hemorrhage or extra-axial collection. There is generalized atrophy without lobar predilection. There is periventricular hypoattenuation compatible with chronic microvascular disease. There are old right frontal and parietal infarcts. Vascular: No abnormal hyperdensity of the major intracranial arteries or dural venous sinuses. No intracranial atherosclerosis. Skull: The visualized skull base, calvarium and extracranial soft tissues are normal. Sinuses/Orbits: No fluid levels or advanced mucosal thickening of the visualized paranasal sinuses. No mastoid or middle ear effusion. The orbits are normal. CT CERVICAL SPINE FINDINGS Alignment: No static subluxation. Facets are aligned. Occipital condyles are normally positioned. Skull base and vertebrae: No acute fracture. Soft tissues and spinal canal: No prevertebral fluid or swelling. No visible canal hematoma. Disc levels: No advanced spinal canal or neural foraminal stenosis. Upper chest: No pneumothorax, pulmonary nodule or pleural effusion. Other: Normal visualized paraspinal cervical soft tissues. IMPRESSION: 1. Old right frontal and parietal infarcts without acute intracranial abnormality. 2. No acute fracture or static subluxation of  the cervical spine. Electronically Signed   By: Deatra Robinson  M.D.   On: 05/02/2021 23:30    Procedures Procedures   Medications Ordered in ED Medications - No data to display  ED Course  I have reviewed the triage vital signs and the nursing notes.  Pertinent labs & imaging results that were available during my care of the patient were reviewed by me and considered in my medical decision making (see chart for details).    MDM Rules/Calculators/A&P                          Patient to ED after fall x 2 in 2 days. Both were mechanical falls. She is anticoagulated putting her at high risk.   Head CT and neck CT normal. No fracture of lumbar spine or coccyx. She is ambulatory. Percocet provided for pain which helped.   VSS. Son at bedside to take the patient home. She has been evaluated by Dr. Preston FleetingGlick who agrees with plan of discharge.   Final Clinical Impression(s) / ED Diagnoses Final diagnoses:  None   1. Fall 2. Low back pain 3. Coagulopathy   Rx / DC Orders ED Discharge Orders    None       Danne HarborUpstill, Annie Saephan, PA-C 05/03/21 [id number]    Dione BoozeGlick, David, MD 05/03/21 [phone]

## 2021-05-05 DIAGNOSIS — R0902 Hypoxemia: Secondary | ICD-10-CM | POA: Diagnosis not present

## 2021-05-05 DIAGNOSIS — G4733 Obstructive sleep apnea (adult) (pediatric): Secondary | ICD-10-CM | POA: Diagnosis not present

## 2021-05-08 ENCOUNTER — Other Ambulatory Visit: Payer: Self-pay

## 2021-05-08 ENCOUNTER — Emergency Department (HOSPITAL_COMMUNITY)
Admission: EM | Admit: 2021-05-08 | Discharge: 2021-05-09 | Disposition: A | Discharge status: Home / Self Care | Payer: Medicare Other | Attending: Emergency Medicine | Admitting: Emergency Medicine

## 2021-05-08 ENCOUNTER — Encounter (HOSPITAL_COMMUNITY): Payer: Self-pay

## 2021-05-08 ENCOUNTER — Emergency Department (HOSPITAL_COMMUNITY): Payer: Medicare Other

## 2021-05-08 DIAGNOSIS — R6889 Other general symptoms and signs: Secondary | ICD-10-CM | POA: Diagnosis not present

## 2021-05-08 DIAGNOSIS — Z87891 Personal history of nicotine dependence: Secondary | ICD-10-CM | POA: Insufficient documentation

## 2021-05-08 DIAGNOSIS — Z950 Presence of cardiac pacemaker: Secondary | ICD-10-CM | POA: Diagnosis not present

## 2021-05-08 DIAGNOSIS — I110 Hypertensive heart disease with heart failure: Secondary | ICD-10-CM | POA: Insufficient documentation

## 2021-05-08 DIAGNOSIS — Z955 Presence of coronary angioplasty implant and graft: Secondary | ICD-10-CM | POA: Diagnosis not present

## 2021-05-08 DIAGNOSIS — I4891 Unspecified atrial fibrillation: Secondary | ICD-10-CM | POA: Insufficient documentation

## 2021-05-08 DIAGNOSIS — Z743 Need for continuous supervision: Secondary | ICD-10-CM | POA: Diagnosis not present

## 2021-05-08 DIAGNOSIS — Z7901 Long term (current) use of anticoagulants: Secondary | ICD-10-CM | POA: Insufficient documentation

## 2021-05-08 DIAGNOSIS — I517 Cardiomegaly: Secondary | ICD-10-CM | POA: Diagnosis not present

## 2021-05-08 DIAGNOSIS — E876 Hypokalemia: Secondary | ICD-10-CM | POA: Insufficient documentation

## 2021-05-08 DIAGNOSIS — I5022 Chronic systolic (congestive) heart failure: Secondary | ICD-10-CM | POA: Diagnosis not present

## 2021-05-08 DIAGNOSIS — I499 Cardiac arrhythmia, unspecified: Secondary | ICD-10-CM | POA: Diagnosis not present

## 2021-05-08 DIAGNOSIS — Z79899 Other long term (current) drug therapy: Secondary | ICD-10-CM | POA: Diagnosis not present

## 2021-05-08 DIAGNOSIS — L03115 Cellulitis of right lower limb: Secondary | ICD-10-CM | POA: Insufficient documentation

## 2021-05-08 DIAGNOSIS — J90 Pleural effusion, not elsewhere classified: Secondary | ICD-10-CM | POA: Diagnosis not present

## 2021-05-08 DIAGNOSIS — D696 Thrombocytopenia, unspecified: Secondary | ICD-10-CM

## 2021-05-08 DIAGNOSIS — I5023 Acute on chronic systolic (congestive) heart failure: Secondary | ICD-10-CM | POA: Diagnosis not present

## 2021-05-08 DIAGNOSIS — R0602 Shortness of breath: Secondary | ICD-10-CM | POA: Diagnosis not present

## 2021-05-08 DIAGNOSIS — R21 Rash and other nonspecific skin eruption: Secondary | ICD-10-CM | POA: Diagnosis not present

## 2021-05-08 LAB — BASIC METABOLIC PANEL
Anion gap: 11 (ref 5–15)
BUN: 19 mg/dL (ref 8–23)
CO2: 30 mmol/L (ref 22–32)
Calcium: 9.4 mg/dL (ref 8.9–10.3)
Chloride: 98 mmol/L (ref 98–111)
Creatinine, Ser: 0.77 mg/dL (ref 0.44–1.00)
GFR, Estimated: >60 mL/min (ref >60)
Glucose, Bld: 117 mg/dL — ABNORMAL HIGH (ref 70–99)
Potassium: 3.2 mmol/L — ABNORMAL LOW (ref 3.5–5.1)
Sodium: 139 mmol/L (ref 135–145)

## 2021-05-08 NOTE — ED Provider Notes (Signed)
[facility]-EMERGENCY DEPT Provider Note   CSN: [id number] Arrival date & time: 05/08/21  2131     History Chief Complaint  Patient presents with  . Shortness of Breath    Misty Gray is a 70 y.o. female.  The history is provided by the patient.  Shortness of Breath She has history of hypertension, hyperlipidemia, nonischemic cardiomyopathy, chronic atrial fibrillation anticoagulated on edoxaban and comes in because of shortness of breath.  She states that her home oxygen tank has not been able to be refilled for about the last 6 weeks.  Tonight, she noted that her nailbeds were blue so she thought her oxygen levels were low.  She denies chest pain, heaviness, tightness, pressure.  She denies any cough.  She denies fever or chills.  She does not have a home pulse oximeter.  She states that the place where she had been getting the oxygen tank refills is no longer doing good and she has not found a replacement location.   Past Medical History:  Diagnosis Date  . Abnormal CT of the head 12/16/1984   R parietal atrophy  . Allergic rhinitis, cause unspecified   . Altered mental status 11/28/2016  . Anemia   . Arthritis    "hands and knees" (09/06/2015)  . Atrial fibrillation (HCC)   . Cellulitis 09/07/2015  . Cellulitis and abscess of leg 09/2016   bilateral  . CHF (congestive heart failure) (HCC)   . Diaphragmatic hernia without mention of obstruction or gangrene   . Dyspnea   . Exertional dyspnea 06/22/12  . Family history of adverse reaction to anesthesia    "daughter fights w/them; just can't relax during OR; stay awake during the OR"  . Fatigue 06/22/2012  . Female stress incontinence   . GERD (gastroesophageal reflux disease)   . Grand mal seizure (HCC)   . H/O hiatal hernia    two removed  . Heart murmur   . High cholesterol   . History of blood transfusion 1956   "related to nose bleed"  . Hypertension   . Influenza A 01/11/2014  . Lichenification  and lichen simplex chronicus   . Migraine    "when I was a teenager"  . Morbid obesity (HCC)   . Nonischemic cardiomyopathy (HCC)    EF has normalized-repeat Pending   . On home oxygen therapy    "2L when I'm asleep in bed" (09/06/2015)  . OSA on CPAP   . Pacemaker  st judes    Patient states "this is my third pacemaker"  . Pneumonia 2004; 2011; 11/2011  . Seizures (HCC) since 1981   "can hear you talking but sounds like you are in big tunnel; have them often if not taking RX; last one was 06/17/12" (09/06/2015)  . Shortness of breath 10/24/2010   Qualifier: Diagnosis of  By: [place], Bonnie    . Sick sinus syndrome (HCC)    WITH PRIOR DDD PACEMAKER IMPLANTATION  . Somnolence   . Stroke [facility]) 1988   "mouth drawed real bad on my left side; found out it was a seizure"  . Syncope and collapse 06/22/12   "hit forehead and left knee"; denies loss of consciousness  . Unspecified venous (peripheral) insufficiency     Patient Active Problem List   Diagnosis Date Noted  . Hand injury, left, initial encounter 09/22/2020  . CHF exacerbation (HCC) 07/12/2020  . Hospital discharge follow-up 11/17/2019  . Onychomycosis 11/17/2019  . Abdominal wall hernia 11/05/2017  .  Anticoagulated 11/05/2017  . Rash and nonspecific skin eruption 09/15/2017  . Ventral hernia without obstruction or gangrene 08/15/2017  . Skin irritation 07/23/2017  . Chronic venous stasis dermatitis 12/26/2016  . Urinary tract infection without hematuria   . Frequent PVCs   . Hypokalemia   . Housing problems 11/23/2016  . Chronic atrial fibrillation (HCC) 11/01/2016  . Hypotension 10/31/2016  . Healthcare maintenance 08/12/2016  . Heme positive stool 08/09/2016  . Intertrigo 08/09/2016  . Chronic anticoagulation 04/26/2016  . Chronic venous insufficiency   . Breast mass, left 10/05/2014  . Osteopenia 09/15/2014  . Hypertension 01/04/2014  . Chronic systolic dysfunction of left ventricle 05/05/2013  . At high risk for  falls 02/11/2013  . Vitamin D deficiency 07/15/2012  . Sinoatrial node dysfunction (HCC) 05/08/2011  . Pacemaker 08/10/2009  . Allergic rhinitis 05/31/2008  . Morbid obesity (HCC) 02/12/2007  . GASTROESOPHAGEAL REFLUX, NO ESOPHAGITIS 02/12/2007  . Female stress incontinence 02/12/2007  . Seizure disorder (HCC) 02/12/2007  . OSA (obstructive sleep apnea) 02/12/2007    Past Surgical History:  Procedure Laterality Date  . APPENDECTOMY    . CARDIAC CATHETERIZATION N/A 10/03/2016   Procedure: Right/Left Heart Cath and Coronary Angiography;  Surgeon: Dolores Patty, MD;  Location: [facility] INVASIVE CV LAB;  Service: Cardiovascular;  Laterality: N/A;  . HERNIA REPAIR  ? 1988; 04/15/1998  . INSERT / REPLACE / REMOVE PACEMAKER  12/16/1990   DDD EF 30%;  Marland Kitchen INSERT / REPLACE / REMOVE PACEMAKER  07/25/2003   replaced by BB, leads are both IS1 and from 1992  . INSERT / REPLACE / REMOVE PACEMAKER  05/21/2011   JA; generator change  . LEG SKIN LESION  BIOPSY / EXCISION  05/16/2001   Lichen Planus  . VENTRAL HERNIA REPAIR  1989; 04/15/1998     OB History   No obstetric history on file.     Family History  Problem Relation Age of Onset  . Stroke Father        died from CAD?  Marland Kitchen Heart disease Father   . Cancer Mother   . Diabetes Son        Type 1  . Sleep apnea Son   . Cancer Sister        cervical  . Hypertension Sister   . Hypertension Brother   . Rheum arthritis Daughter        Also PGM, PGGM    Social History   Tobacco Use  . Smoking status: Former Smoker    Packs/day: 1.00    Years: 7.00    Pack years: 7.00    Types: Cigarettes    Quit date: 03/16/1969    Years since quitting: 52.1  . Smokeless tobacco: Never Used  Vaping Use  . Vaping Use: Never used  Substance Use Topics  . Alcohol use: No  . Drug use: No    Home Medications Prior to Admission medications   Medication Sig Start Date End Date Taking? Authorizing Provider  amiodarone (PACERONE) 200 MG tablet Take 0.5 tablets  (100 mg total) by mouth daily. 02/22/21   Jacklynn Ganong, FNP  cholecalciferol (VITAMIN D) 25 MCG (1000 UNIT) tablet Take 1 tablet (1,000 Units total) by mouth daily. 04/25/21   Latrelle Dodrill, MD  edoxaban (SAVAYSA) 60 MG TABS tablet Take 60 mg by mouth daily. 02/22/21   Milford, Anderson Malta, FNP  fluticasone (FLONASE) 50 MCG/ACT nasal spray Place 2 sprays into both nostrils daily as needed. For congestion. 07/28/17  Latrelle Dodrill, MD  HYDROcodone-acetaminophen (NORCO) 5-325 MG tablet Take 1 tablet by mouth every 4 (four) hours as needed. 05/03/21   Elpidio Anis, PA-C  losartan (COZAAR) 25 MG tablet Take 0.5 tablets (12.5 mg total) by mouth daily. 03/13/21   [name], [name], MD  omeprazole (PRILOSEC) 20 MG capsule TAKE ONE CAPSULE BY MOUTH EVERY MORNING 04/30/21   Latrelle Dodrill, MD  PHENobarbital (LUMINAL) 64.8 MG tablet Take 1 tablet (64.8 mg total) by mouth 2 (two) times daily. 03/19/21   Latrelle Dodrill, MD  potassium chloride (KLOR-CON) 10 MEQ tablet TAKE 4 TABLETS BY MOUTH THREE TIMES DAILY (MORNING, NOON, AND NIGHT) 12/29/20   [name], [name], MD  spironolactone (ALDACTONE) 25 MG tablet Take 1 tablet (25 mg total) by mouth daily. 12/29/20   [name], [name], MD  torsemide (DEMADEX) 20 MG tablet Take 4 tablets (80 mg total) by mouth in the morning AND 3 tablets (60 mg total) every evening. 12/29/20   [name], [name], MD    Allergies    Aspirin, Penicillins, Tape, Wool alcohol [lanolin], Amoxicillin, and Ampicillin  Review of Systems   Review of Systems  Respiratory: Positive for shortness of breath.   All other systems reviewed and are negative.   Physical Exam Updated Vital Signs BP (!) 102/57 (BP Location: Left Arm)   Pulse 82   Temp 99.4 F (37.4 C) (Oral)   Resp 16   Ht 4\' 9"  (1.448 m)   Wt 78 kg   SpO2 98%   BMI 37.22 kg/m   Physical Exam Vitals and nursing note reviewed.   70 year old female, resting comfortably and in no acute  distress. Vital signs are normal. Oxygen saturation is 98%, which is normal. Head is normocephalic and atraumatic. PERRLA, EOMI. Oropharynx is clear. Neck is nontender and supple without adenopathy or JVD. Back is nontender and there is no CVA tenderness. Lungs are clear without rales, wheezes, or rhonchi.  Prolonged exhalation phase is noted. Chest is nontender. Heart has regular rate and rhythm without murmur. Abdomen is soft, flat, nontender without masses or hepatosplenomegaly and peristalsis is normoactive. Extremities have 1+ edema.  There is erythema and warmth of the right lower leg, and there is a healing laceration noted just superior to the right medial malleolus.  Appearance is concerning for cellulitis.  There is no cyanosis. Skin is warm and dry without rash. Neurologic: Mental status is normal, cranial nerves are intact, there are no motor or sensory deficits.   ED Results / Procedures / Treatments   Labs (all labs ordered are listed, but only abnormal results are displayed) Labs Reviewed  BASIC METABOLIC PANEL - Abnormal; Notable for the following components:      Result Value   Potassium 3.2 (*)    Glucose, Bld 117 (*)    All other components within normal limits  CBC WITH DIFFERENTIAL/PLATELET - Abnormal; Notable for the following components:   WBC 14.4 (*)    Platelets 95 (*)    Neutro Abs 13.0 (*)    Lymphs Abs 0.6 (*)    Abs Immature Granulocytes 0.08 (*)    All other components within normal limits  BRAIN NATRIURETIC PEPTIDE - Abnormal; Notable for the following components:   B Natriuretic Peptide 1,550.6 (*)    All other components within normal limits    EKG EKG Interpretation  Date/Time:  Tuesday May 08 2021 22:22:44 EDT Ventricular Rate:  90 PR Interval:    QRS Duration: 105 QT  Interval:  412 QTC Calculation: 451 R Axis:   -15 Text Interpretation: Atrial fibrillation Ventricular premature complex Borderline left axis deviation Probable anterior  infarct, age indeterminate Flat T waves have replaced inverted T waves in When compared with ECG of 01/30/2021, Premature ventricular complexes are now present Confirmed by Dione Booze ([id number]) on 05/08/2021 11:19:51 PM   Radiology DG Chest 2 View  Result Date: 05/08/2021 CLINICAL DATA:  70 year old female with shortness of breath. EXAM: CHEST - 2 VIEW COMPARISON:  Chest radiograph dated 07/13/2020. FINDINGS: Small left pleural effusion and left lung base atelectasis or infiltrate. The right lung is clear. No pneumothorax. There is cardiomegaly with mild central vascular congestion. Right pectoral pacemaker device. No acute osseous pathology. IMPRESSION: Small left pleural effusion and left lung base atelectasis or infiltrate. Electronically Signed   By: Elgie Collard M.D.   On: 05/08/2021 22:29    Procedures Procedures   Medications Ordered in ED Medications  doxycycline (VIBRA-TABS) tablet 100 mg (has no administration in time range)  potassium chloride SA (KLOR-CON) CR tablet 40 mEq (40 mEq Oral Given 05/09/21 0048)    ED Course  I have reviewed the triage vital signs and the nursing notes.  Pertinent labs & imaging results that were available during my care of the patient were reviewed by me and considered in my medical decision making (see chart for details).   MDM Rules/Calculators/A&P                         Patient concern for hypoxia at home, normal oxygen saturations here.  Chest x-ray is read by radiologist as possible infiltrate left base as well as left pleural effusion.  On reviewing the images, I's see the atelectasis and effusion but no ill infiltrate.  Clinically patient does not have pneumonia.  ECG shows atrial fibrillation and PVC, no significant change from prior.  Will check screening labs.  She will need to be on antibiotics for her cellulitis.  We will also need social service to work on getting appropriate home oxygen.  She may benefit from an oxygen concentrator  rather than a tank.  Old records are reviewed, and she has no relevant past visits.  Labs show hypokalemia.  She is already taking a fairly high dose of potassium at home, but will need to take extra potassium.  She is given a dose of potassium in the emergency department.  WBC is mildly elevated with left shift.  This is felt to be secondary to the cellulitis.  Thrombocytopenia is present, unchanged from baseline.  BNP is approximately at her baseline.  Oxygen saturations remained adequate while in the ED.  She was ambulated and did not have any oxygen desaturation.  She is felt to be safe for discharge.  She is discharged with prescription for doxycycline, told to take extra potassium for the next 5 days.  Follow-up with PCP in 1 week to reevaluate the cellulitis.  Transitions of care team consulted to try to get stable on home oxygen for her.  Final Clinical Impression(s) / ED Diagnoses Final diagnoses:  Chronic systolic heart failure (HCC)  Diuretic-induced hypokalemia  Cellulitis of right lower leg  Thrombocytopenia (HCC)    Rx / DC Orders ED Discharge Orders         Ordered    Consult to Transition of Care Team       Comments: Needs help with home oxygen - says that she has not been able to get  tank refilled, may be better off with oxygen concentrator.  Provider:  (Not yet assigned)   05/09/21 0229    doxycycline (VIBRAMYCIN) 100 MG capsule  2 times daily        05/09/21 0234           Dione BoozeGlick, Sarajean Dessert, MD 05/09/21 [phone]

## 2021-05-08 NOTE — ED Triage Notes (Signed)
Pt presents from home via [facility], c/o feeling SOB x 1 wk. Denies fevers, edema or cough. States her home oxygen that she wears at night has been broken x 1 mo.

## 2021-05-09 LAB — CBC WITH DIFFERENTIAL/PLATELET
Abs Immature Granulocytes: 0.08 10*3/uL — ABNORMAL HIGH (ref 0.00–0.07)
Basophils Absolute: 0 10*3/uL (ref 0.0–0.1)
Basophils Relative: 0 %
Eosinophils Absolute: 0 10*3/uL (ref 0.0–0.5)
Eosinophils Relative: 0 %
HCT: 44.4 % (ref 36.0–46.0)
Hemoglobin: 14.6 g/dL (ref 12.0–15.0)
Immature Granulocytes: 1 %
Lymphocytes Relative: 4 %
Lymphs Abs: 0.6 10*3/uL — ABNORMAL LOW (ref 0.7–4.0)
MCH: 31.7 pg (ref 26.0–34.0)
MCHC: 32.9 g/dL (ref 30.0–36.0)
MCV: 96.3 fL (ref 80.0–100.0)
Monocytes Absolute: 0.8 10*3/uL (ref 0.1–1.0)
Monocytes Relative: 5 %
Neutro Abs: 13 10*3/uL — ABNORMAL HIGH (ref 1.7–7.7)
Neutrophils Relative %: 90 %
Platelets: 95 10*3/uL — ABNORMAL LOW (ref 150–400)
RBC: 4.61 MIL/uL (ref 3.87–5.11)
RDW: 14.5 % (ref 11.5–15.5)
WBC: 14.4 10*3/uL — ABNORMAL HIGH (ref 4.0–10.5)
nRBC: 0 % (ref 0.0–0.2)

## 2021-05-09 LAB — BRAIN NATRIURETIC PEPTIDE: B Natriuretic Peptide: 1550.6 pg/mL — ABNORMAL HIGH (ref 0.0–100.0)

## 2021-05-09 MED ORDER — DOXYCYCLINE HYCLATE 100 MG PO TABS
100.0000 mg | ORAL_TABLET | Freq: Once | ORAL | Status: AC
  Administered 2021-05-09: 100 mg via ORAL
  Filled 2021-05-09: qty 1

## 2021-05-09 MED ORDER — DOXYCYCLINE HYCLATE 100 MG PO CAPS: 100.0000 mg | ORAL_CAPSULE | Freq: Two times a day (BID) | ORAL | 0 refills | Status: DC

## 2021-05-09 MED ORDER — NYSTATIN 100000 UNIT/GM EX POWD
Freq: Once | CUTANEOUS | Status: AC
  Filled 2021-05-09: qty 15

## 2021-05-09 MED ORDER — POTASSIUM CHLORIDE CRYS ER 20 MEQ PO TBCR
40.0000 meq | EXTENDED_RELEASE_TABLET | Freq: Once | ORAL | Status: AC
  Administered 2021-05-09: 40 meq via ORAL
  Filled 2021-05-09: qty 2

## 2021-05-09 NOTE — Discharge Instructions (Addendum)
Please take an extra dose of your potassium each day for the next five days.  Work with your primary care provider and the heart failure clinic to get your home oxygen set up properly.  You are being given an antibiotic to treat the infection in your right leg.  Please follow-up with your primary care provider to make sure that the infection is clearing.   Return to the emergency department if you develop a fever, or if your breathing is getting worse.

## 2021-05-09 NOTE — ED Notes (Signed)
94% ambulate in room. Pt unstable while standing c/o weakness to right leg.

## 2021-05-10 ENCOUNTER — Ambulatory Visit (INDEPENDENT_AMBULATORY_CARE_PROVIDER_SITE_OTHER): Payer: Medicare Other

## 2021-05-10 DIAGNOSIS — I495 Sick sinus syndrome: Secondary | ICD-10-CM

## 2021-05-11 LAB — CUP PACEART REMOTE DEVICE CHECK
Battery Remaining Longevity: 0 mo
Battery Voltage: 2.57 V
Brady Statistic AP VP Percent: 0 %
Brady Statistic AP VS Percent: 0 %
Brady Statistic AS VP Percent: 0 %
Brady Statistic AS VS Percent: 0 %
Brady Statistic RA Percent Paced: 0 %
Brady Statistic RV Percent Paced: 19 %
Date Time Interrogation Session: 20220526141541
Implantable Lead Implant Date: 19920327
Implantable Lead Implant Date: 19920327
Implantable Lead Location: 753859
Implantable Lead Location: 753860
Implantable Pulse Generator Implant Date: 20120605
Lead Channel Impedance Value: 350 Ohm
Lead Channel Impedance Value: 530 Ohm
Lead Channel Pacing Threshold Amplitude: 1 V
Lead Channel Pacing Threshold Amplitude: 1.5 V
Lead Channel Pacing Threshold Pulse Width: 0.5 ms
Lead Channel Pacing Threshold Pulse Width: 1 ms
Lead Channel Sensing Intrinsic Amplitude: 0.2 mV
Lead Channel Sensing Intrinsic Amplitude: 8.8 mV
Lead Channel Setting Pacing Amplitude: 2.5 V
Lead Channel Setting Pacing Amplitude: 2.5 V
Lead Channel Setting Pacing Pulse Width: 1 ms
Lead Channel Setting Sensing Sensitivity: 1.5 mV
Pulse Gen Model: 2210
Pulse Gen Serial Number: 7254513

## 2021-05-14 ENCOUNTER — Other Ambulatory Visit: Payer: Self-pay | Admitting: Family Medicine

## 2021-05-14 ENCOUNTER — Other Ambulatory Visit (HOSPITAL_COMMUNITY): Payer: Self-pay

## 2021-05-14 NOTE — Progress Notes (Signed)
Paramedicine Encounter    Patient ID: Misty Gray, female    DOB: 11/17/1951, 70 y.o.   MRN: [id number]   Patient Care Team: Latrelle Dodrill, MD as PCP - General (Family Medicine) [name], MD (Cardiology) Burna Sis, LCSW as Social Worker (Licensed Clinical Social Worker)  Patient Active Problem List   Diagnosis Date Noted  . Hand injury, left, initial encounter 09/22/2020  . CHF exacerbation (HCC) 07/12/2020  . Hospital discharge follow-up 11/17/2019  . Onychomycosis 11/17/2019  . Abdominal wall hernia 11/05/2017  . Anticoagulated 11/05/2017  . Rash and nonspecific skin eruption 09/15/2017  . Ventral hernia without obstruction or gangrene 08/15/2017  . Skin irritation 07/23/2017  . Chronic venous stasis dermatitis 12/26/2016  . Urinary tract infection without hematuria   . Frequent PVCs   . Hypokalemia   . Housing problems 11/23/2016  . Chronic atrial fibrillation (HCC) 11/01/2016  . Hypotension 10/31/2016  . Healthcare maintenance 08/12/2016  . Heme positive stool 08/09/2016  . Intertrigo 08/09/2016  . Chronic anticoagulation 04/26/2016  . Chronic venous insufficiency   . Breast mass, left 10/05/2014  . Osteopenia 09/15/2014  . Hypertension 01/04/2014  . Chronic systolic dysfunction of left ventricle 05/05/2013  . At high risk for falls 02/11/2013  . Vitamin D deficiency 07/15/2012  . Sinoatrial node dysfunction (HCC) 05/08/2011  . Pacemaker 08/10/2009  . Allergic rhinitis 05/31/2008  . Morbid obesity (HCC) 02/12/2007  . GASTROESOPHAGEAL REFLUX, NO ESOPHAGITIS 02/12/2007  . Female stress incontinence 02/12/2007  . Seizure disorder (HCC) 02/12/2007  . OSA (obstructive sleep apnea) 02/12/2007    Current Outpatient Medications:  .  amiodarone (PACERONE) 200 MG tablet, Take 0.5 tablets (100 mg total) by mouth daily., Disp: 45 tablet, Rfl: 3 .  cholecalciferol (VITAMIN D) 25 MCG (1000 UNIT) tablet, Take 1 tablet (1,000 Units total) by mouth daily., Disp:  28 tablet, Rfl: 1 .  doxycycline (VIBRAMYCIN) 100 MG capsule, Take 1 capsule (100 mg total) by mouth 2 (two) times daily. One po bid x 7 days, Disp: 14 capsule, Rfl: 0 .  edoxaban (SAVAYSA) 60 MG TABS tablet, Take 60 mg by mouth daily., Disp: 30 tablet, Rfl: 11 .  fluticasone (FLONASE) 50 MCG/ACT nasal spray, Place 2 sprays into both nostrils daily as needed. For congestion., Disp: 16 g, Rfl: 1 .  losartan (COZAAR) 25 MG tablet, Take 0.5 tablets (12.5 mg total) by mouth daily., Disp: 15 tablet, Rfl: 6 .  omeprazole (PRILOSEC) 20 MG capsule, TAKE ONE CAPSULE BY MOUTH EVERY MORNING, Disp: 30 capsule, Rfl: 2 .  PHENobarbital (LUMINAL) 64.8 MG tablet, Take 1 tablet (64.8 mg total) by mouth 2 (two) times daily., Disp: 56 tablet, Rfl: 1 .  potassium chloride (KLOR-CON) 10 MEQ tablet, TAKE 4 TABLETS BY MOUTH THREE TIMES DAILY (MORNING, NOON, AND NIGHT), Disp: 360 tablet, Rfl: 6 .  spironolactone (ALDACTONE) 25 MG tablet, Take 1 tablet (25 mg total) by mouth daily., Disp: 30 tablet, Rfl: 6 .  torsemide (DEMADEX) 20 MG tablet, Take 4 tablets (80 mg total) by mouth in the morning AND 3 tablets (60 mg total) every evening., Disp: 210 tablet, Rfl: 6 .  HYDROcodone-acetaminophen (NORCO) 5-325 MG tablet, Take 1 tablet by mouth every 4 (four) hours as needed. (Patient not taking: Reported on 05/14/2021), Disp: 10 tablet, Rfl: 0 Allergies  Allergen Reactions  . Aspirin Hives and Rash  . Penicillins Rash and Other (See Comments)    Has patient had a PCN reaction causing immediate rash, facial/tongue/throat swelling, SOB or lightheadedness  with hypotension: Yes Has patient had a PCN reaction causing severe rash involving mucus membranes or skin necrosis: No Has patient had a PCN reaction that required hospitalization No Has patient had a PCN reaction occurring within the last 10 years: Yes If all of the above answers are "NO", then may proceed with Cephalosporin use.  Patient has tolerated cephalosporins in 2017.   . Tape Other (See Comments)    NO PAPER TAPE-MUST BE MEDICAL TAPE - unknown reaction NO PAPER TAPE-MUST BE MEDICAL TAPE - unknown reaction  . Wool Alcohol [Lanolin] Hives  . Amoxicillin Rash    Patient has tolerated cephalosporins  . Ampicillin Itching and Rash    Patient has tolerated cephalosporins      Social History   Socioeconomic History  . Marital status: Married    Spouse name: [name]  . Number of children: 4  . Years of education: 18  . Highest education level: Not on file  Occupational History  . Occupation: disabled  Tobacco Use  . Smoking status: Former Smoker    Packs/day: 1.00    Years: 7.00    Pack years: 7.00    Types: Cigarettes    Quit date: 03/16/1969    Years since quitting: 52.1  . Smokeless tobacco: Never Used  Vaping Use  . Vaping Use: Never used  Substance and Sexual Activity  . Alcohol use: No  . Drug use: No  . Sexual activity: Not Currently    Birth control/protection: Post-menopausal  Other Topics Concern  . Not on file  Social History Narrative   Lives with husband [name] lives with them as do his 2 children   Daughter, [name], and her 3 children live in [place] -  [name] is incarcerated   Nephew, Alyse Kathan lives with them   Daughter, [name], in [name] [address]   Moved June 2013 to a better home on [facility].      Lives with husband, son, grandson, granddaughter, nephew.   Social Determinants of Health   Financial Resource Strain: Medium Risk  . Difficulty of Paying Living Expenses: Somewhat hard  Food Insecurity: No Food Insecurity  . Worried About Programme researcher, broadcasting/film/video in the Last Year: Never true  . Ran Out of Food in the Last Year: Never true  Transportation Needs: No Transportation Needs  . Lack of Transportation (Medical): No  . Lack of Transportation (Non-Medical): No  Physical Activity: Not on file  Stress: Not on file  Social Connections: Not on  file  Intimate Partner Violence: Not on file    Physical Exam      Future Appointments  Date Time Provider Department Center  05/30/2021  1:40 PM [name], [name], MD [facility] None  06/11/2021  7:55 AM [facility] DEVICE REMOTES [name]    BP 112/70   Pulse 76   Resp 18   SpO2 97%   Weight yesterday-173 Last visit weight-172  Pt had a fall on 5/18-she was seen in ER for eval that she reported from her breaking a fight between her dogs and she tripped and fell.  Pt was seen in the ER last week for sob, she was treated for possible cellulitis.  The doxy was not sent out yet-I called [facility] and scheduled that delivery for today.  It is possible she has missed some doses-there are several days left in her pill box and it should be empty. She  has had a lot going on with taking care of her husband and going back and forth to hosp to see him as he is not doing well, possibly end of life.  She reported issues with obtaining 02 tanks for refills.  Will look into that.  She will need amio for next week.  Will have to get clinic to [facility].  [facility] for [facility] moving forward.  Will see about getting her labs rechecked in the next week or so.  She said she needs her flonase and without that, it made her feel sob.  She does not feel like she has fluid on her.  Will start the doxy today.  Will see her next week.    Kerry Hough, EMT-Paramedic [phone] [facility] Paramedic  05/14/21

## 2021-05-15 ENCOUNTER — Other Ambulatory Visit (HOSPITAL_COMMUNITY): Payer: Self-pay | Admitting: *Deleted

## 2021-05-15 MED ORDER — AMIODARONE HCL 200 MG PO TABS: 100.0000 mg | ORAL_TABLET | Freq: Every day | ORAL | 3 refills | Status: DC

## 2021-05-17 ENCOUNTER — Emergency Department (HOSPITAL_COMMUNITY): Payer: Medicare Other

## 2021-05-17 ENCOUNTER — Emergency Department (HOSPITAL_COMMUNITY)
Admission: EM | Admit: 2021-05-17 | Discharge: 2021-05-17 | Disposition: A | Discharge status: Home / Self Care | Payer: Medicare Other | Attending: Emergency Medicine | Admitting: Emergency Medicine

## 2021-05-17 ENCOUNTER — Encounter (HOSPITAL_COMMUNITY): Payer: Self-pay | Admitting: Emergency Medicine

## 2021-05-17 DIAGNOSIS — Z87891 Personal history of nicotine dependence: Secondary | ICD-10-CM | POA: Diagnosis not present

## 2021-05-17 DIAGNOSIS — Z7901 Long term (current) use of anticoagulants: Secondary | ICD-10-CM | POA: Diagnosis not present

## 2021-05-17 DIAGNOSIS — Y92137 Garden or yard on military base as the place of occurrence of the external cause: Secondary | ICD-10-CM | POA: Insufficient documentation

## 2021-05-17 DIAGNOSIS — Z79899 Other long term (current) drug therapy: Secondary | ICD-10-CM | POA: Insufficient documentation

## 2021-05-17 DIAGNOSIS — Z950 Presence of cardiac pacemaker: Secondary | ICD-10-CM | POA: Diagnosis not present

## 2021-05-17 DIAGNOSIS — Y9301 Activity, walking, marching and hiking: Secondary | ICD-10-CM | POA: Diagnosis not present

## 2021-05-17 DIAGNOSIS — I482 Chronic atrial fibrillation, unspecified: Secondary | ICD-10-CM | POA: Diagnosis not present

## 2021-05-17 DIAGNOSIS — I509 Heart failure, unspecified: Secondary | ICD-10-CM | POA: Insufficient documentation

## 2021-05-17 DIAGNOSIS — I110 Hypertensive heart disease with heart failure: Secondary | ICD-10-CM | POA: Diagnosis not present

## 2021-05-17 DIAGNOSIS — S6992XA Unspecified injury of left wrist, hand and finger(s), initial encounter: Secondary | ICD-10-CM | POA: Diagnosis present

## 2021-05-17 DIAGNOSIS — W19XXXA Unspecified fall, initial encounter: Secondary | ICD-10-CM | POA: Diagnosis not present

## 2021-05-17 DIAGNOSIS — S52612A Displaced fracture of left ulna styloid process, initial encounter for closed fracture: Secondary | ICD-10-CM | POA: Diagnosis not present

## 2021-05-17 DIAGNOSIS — S52502A Unspecified fracture of the lower end of left radius, initial encounter for closed fracture: Secondary | ICD-10-CM | POA: Insufficient documentation

## 2021-05-17 MED ORDER — OXYCODONE-ACETAMINOPHEN 5-325 MG PO TABS
1.0000 | ORAL_TABLET | Freq: Once | ORAL | Status: AC
Start: 2021-05-17 — End: 2021-05-17
  Administered 2021-05-17: 1 via ORAL
  Filled 2021-05-17: qty 1

## 2021-05-17 MED ORDER — OXYCODONE-ACETAMINOPHEN 5-325 MG PO TABS: 0.5000 | ORAL_TABLET | Freq: Four times a day (QID) | ORAL | 0 refills | Status: DC | PRN

## 2021-05-17 NOTE — ED Notes (Signed)
Call to ortho tech for splint placement

## 2021-05-17 NOTE — Progress Notes (Signed)
Orthopedic Tech Progress Note Patient Details:  Misty Gray Jan 19, 1951 [id number]  Ortho Devices Type of Ortho Device: Ace wrap,Arm sling,Sugartong splint Ortho Device/Splint Location: left Ortho Device/Splint Interventions: Application   Post Interventions Patient Tolerated: Well Instructions Provided: Care of device   Saul Fordyce 05/17/2021, 4:13 PM

## 2021-05-17 NOTE — Discharge Instructions (Signed)
Please read and follow all provided instructions.  Your diagnoses today include:  1. Closed fracture of distal end of left radius, unspecified fracture morphology, initial encounter     Tests performed today include:  An x-ray of the affected area - shows a broken wrist  Vital signs. See below for your results today.   Medications prescribed:   Percocet (oxycodone/acetaminophen) - narcotic pain medication  DO NOT drive or perform any activities that require you to be awake and alert because this medicine can make you drowsy. BE VERY CAREFUL not to take multiple medicines containing Tylenol (also called acetaminophen). Doing so can lead to an overdose which can damage your liver and cause liver failure and possibly death.  Use pain medication only under direct supervision at the lowest possible dose needed to control your pain.   Take any prescribed medications only as directed.  Home care instructions:   Follow any educational materials contained in this packet  Follow R.I.C.E. Protocol:  R - rest your injury   I  - use ice on injury without applying directly to skin  C - compress injury with bandage or splint  E - elevate the injury as much as possible  Follow-up instructions: Please follow-up with Dr. Merlyn Lot who is listed for care of your broken wrist. Call tomorrow to schedule an appointment.   Return instructions:   Please return if your fingers are numb or tingling, appear gray or blue, or you have severe pain (also elevate the arm and loosen splint or wrap if you were given one)  Please return to the Emergency Department if you experience worsening symptoms.   Please return if you have any other emergent concerns.  Additional Information:  Your vital signs today were: BP 122/78 (BP Location: Left Arm)   Pulse 78   Temp 98 F (36.7 C) (Oral)   Resp 18   SpO2 98%  If your blood pressure (BP) was elevated above 135/85 this visit, please have this repeated by  your doctor within one month. --------------

## 2021-05-17 NOTE — ED Triage Notes (Signed)
Per pt, states she fell in her yard trying to put something back in her window injuring her left wrist-swelling, abrasion

## 2021-05-17 NOTE — ED Provider Notes (Signed)
[facility]-EMERGENCY DEPT Provider Note   CSN: [id number] Arrival date & time: 05/17/21  1109     History Chief Complaint  Patient presents with  . Wrist Pain    Misty Gray is a 70 y.o. female.  Patient presents to the emergency department with cute onset of left wrist pain and swelling.  Patient has a history of nonischemic cardiomyopathy, pacemaker placement, atrial fibrillation on anticoagulation.  She states that she was walking up a hill in her yard today when her cane that did not seat right and she lost her balance falling onto an outstretched wrist.  She had immediate pain and swelling.  She sustained a small abrasion to the posterior wrist.  She denies hitting her head or losing consciousness.  She was driven here today by her son. The onset of this condition was acute. The course is constant. Aggravating factors: movement. Alleviating factors: none.          Past Medical History:  Diagnosis Date  . Abnormal CT of the head 12/16/1984   R parietal atrophy  . Allergic rhinitis, cause unspecified   . Altered mental status 11/28/2016  . Anemia   . Arthritis    "hands and knees" (09/06/2015)  . Atrial fibrillation (HCC)   . Cellulitis 09/07/2015  . Cellulitis and abscess of leg 09/2016   bilateral  . CHF (congestive heart failure) (HCC)   . Diaphragmatic hernia without mention of obstruction or gangrene   . Dyspnea   . Exertional dyspnea 06/22/12  . Family history of adverse reaction to anesthesia    "daughter fights w/them; just can't relax during OR; stay awake during the OR"  . Fatigue 06/22/2012  . Female stress incontinence   . GERD (gastroesophageal reflux disease)   . Grand mal seizure (HCC)   . H/O hiatal hernia    two removed  . Heart murmur   . High cholesterol   . History of blood transfusion 1956   "related to nose bleed"  . Hypertension   . Influenza A 01/11/2014  . Lichenification and lichen simplex chronicus   . Migraine     "when I was a teenager"  . Morbid obesity (HCC)   . Nonischemic cardiomyopathy (HCC)    EF has normalized-repeat Pending   . On home oxygen therapy    "2L when I'm asleep in bed" (09/06/2015)  . OSA on CPAP   . Pacemaker  st judes    Patient states "this is my third pacemaker"  . Pneumonia 2004; 2011; 11/2011  . Seizures (HCC) since 1981   "can hear you talking but sounds like you are in big tunnel; have them often if not taking RX; last one was 06/17/12" (09/06/2015)  . Shortness of breath 10/24/2010   Qualifier: Diagnosis of  By: [place], Bonnie    . Sick sinus syndrome (HCC)    WITH PRIOR DDD PACEMAKER IMPLANTATION  . Somnolence   . Stroke [facility]) 1988   "mouth drawed real bad on my left side; found out it was a seizure"  . Syncope and collapse 06/22/12   "hit forehead and left knee"; denies loss of consciousness  . Unspecified venous (peripheral) insufficiency     Patient Active Problem List   Diagnosis Date Noted  . Hand injury, left, initial encounter 09/22/2020  . CHF exacerbation (HCC) 07/12/2020  . Hospital discharge follow-up 11/17/2019  . Onychomycosis 11/17/2019  . Abdominal wall hernia 11/05/2017  . Anticoagulated 11/05/2017  . Rash and nonspecific skin  eruption 09/15/2017  . Ventral hernia without obstruction or gangrene 08/15/2017  . Skin irritation 07/23/2017  . Chronic venous stasis dermatitis 12/26/2016  . Urinary tract infection without hematuria   . Frequent PVCs   . Hypokalemia   . Housing problems 11/23/2016  . Chronic atrial fibrillation (HCC) 11/01/2016  . Hypotension 10/31/2016  . Healthcare maintenance 08/12/2016  . Heme positive stool 08/09/2016  . Intertrigo 08/09/2016  . Chronic anticoagulation 04/26/2016  . Chronic venous insufficiency   . Breast mass, left 10/05/2014  . Osteopenia 09/15/2014  . Hypertension 01/04/2014  . Chronic systolic dysfunction of left ventricle 05/05/2013  . At high risk for falls 02/11/2013  . Vitamin D deficiency  07/15/2012  . Sinoatrial node dysfunction (HCC) 05/08/2011  . Pacemaker 08/10/2009  . Allergic rhinitis 05/31/2008  . Morbid obesity (HCC) 02/12/2007  . GASTROESOPHAGEAL REFLUX, NO ESOPHAGITIS 02/12/2007  . Female stress incontinence 02/12/2007  . Seizure disorder (HCC) 02/12/2007  . OSA (obstructive sleep apnea) 02/12/2007    Past Surgical History:  Procedure Laterality Date  . APPENDECTOMY    . CARDIAC CATHETERIZATION N/A 10/03/2016   Procedure: Right/Left Heart Cath and Coronary Angiography;  Surgeon: Dolores Patty, MD;  Location: [facility] INVASIVE CV LAB;  Service: Cardiovascular;  Laterality: N/A;  . HERNIA REPAIR  ? 1988; 04/15/1998  . INSERT / REPLACE / REMOVE PACEMAKER  12/16/1990   DDD EF 30%;  Marland Kitchen INSERT / REPLACE / REMOVE PACEMAKER  07/25/2003   replaced by BB, leads are both IS1 and from 1992  . INSERT / REPLACE / REMOVE PACEMAKER  05/21/2011   JA; generator change  . LEG SKIN LESION  BIOPSY / EXCISION  05/16/2001   Lichen Planus  . VENTRAL HERNIA REPAIR  1989; 04/15/1998     OB History   No obstetric history on file.     Family History  Problem Relation Age of Onset  . Stroke Father        died from CAD?  Marland Kitchen Heart disease Father   . Cancer Mother   . Diabetes Son        Type 1  . Sleep apnea Son   . Cancer Sister        cervical  . Hypertension Sister   . Hypertension Brother   . Rheum arthritis Daughter        Also PGM, PGGM    Social History   Tobacco Use  . Smoking status: Former Smoker    Packs/day: 1.00    Years: 7.00    Pack years: 7.00    Types: Cigarettes    Quit date: 03/16/1969    Years since quitting: 52.2  . Smokeless tobacco: Never Used  Vaping Use  . Vaping Use: Never used  Substance Use Topics  . Alcohol use: No  . Drug use: No    Home Medications Prior to Admission medications   Medication Sig Start Date End Date Taking? Authorizing Provider  amiodarone (PACERONE) 200 MG tablet Take 0.5 tablets (100 mg total) by mouth daily. 05/15/21    Clegg, Amy D, NP  cholecalciferol (VITAMIN D) 25 MCG (1000 UNIT) tablet Take 1 tablet (1,000 Units total) by mouth daily. 04/25/21   Latrelle Dodrill, MD  doxycycline (VIBRAMYCIN) 100 MG capsule Take 1 capsule (100 mg total) by mouth 2 (two) times daily. One po bid x 7 days 05/09/21   Dione Booze, MD  edoxaban (SAVAYSA) 60 MG TABS tablet Take 60 mg by mouth daily. 02/22/21   Jacklynn Ganong,  FNP  fluticasone (FLONASE) 50 MCG/ACT nasal spray Place 2 sprays into both nostrils daily as needed. For congestion. 07/28/17   Latrelle Dodrill, MD  HYDROcodone-acetaminophen (NORCO) 5-325 MG tablet Take 1 tablet by mouth every 4 (four) hours as needed. Patient not taking: Reported on 05/14/2021 05/03/21   Elpidio Anis, PA-C  losartan (COZAAR) 25 MG tablet Take 0.5 tablets (12.5 mg total) by mouth daily. 03/13/21   [name], [name], MD  omeprazole (PRILOSEC) 20 MG capsule TAKE ONE CAPSULE BY MOUTH EVERY MORNING 04/30/21   Latrelle Dodrill, MD  PHENobarbital (LUMINAL) 64.8 MG tablet Take 1 tablet (64.8 mg total) by mouth 2 (two) times daily. 03/19/21   Latrelle Dodrill, MD  potassium chloride (KLOR-CON) 10 MEQ tablet TAKE 4 TABLETS BY MOUTH THREE TIMES DAILY (MORNING, NOON, AND NIGHT) 12/29/20   [name], [name], MD  spironolactone (ALDACTONE) 25 MG tablet Take 1 tablet (25 mg total) by mouth daily. 12/29/20   [name], [name], MD  torsemide (DEMADEX) 20 MG tablet Take 4 tablets (80 mg total) by mouth in the morning AND 3 tablets (60 mg total) every evening. 12/29/20   [name], [name], MD    Allergies    Aspirin, Penicillins, Tape, Wool alcohol [lanolin], Amoxicillin, and Ampicillin  Review of Systems   Review of Systems  Constitutional: Negative for activity change.  Musculoskeletal: Positive for arthralgias. Negative for back pain, joint swelling and neck pain.  Skin: Negative for wound.  Neurological: Negative for weakness and numbness.    Physical Exam Updated Vital Signs BP  110/80 (BP Location: Left Arm)   Pulse 80   Temp 98 F (36.7 C) (Oral)   Resp 18   SpO2 96%   Physical Exam Vitals and nursing note reviewed.  Constitutional:      Appearance: She is well-developed.  HENT:     Head: Normocephalic and atraumatic.  Eyes:     Pupils: Pupils are equal, round, and reactive to light.  Cardiovascular:     Pulses: Normal pulses. No decreased pulses.  Musculoskeletal:        General: Tenderness present.     Left shoulder: No tenderness. Normal range of motion.     Left elbow: Normal range of motion. No tenderness.     Left forearm: No tenderness.     Left wrist: Deformity, tenderness and bony tenderness present. Decreased range of motion.     Cervical back: Normal range of motion and neck supple.     Comments: Superficial proximal L wrist abrasion dorsally, not deep. No active bleeding.   Skin:    General: Skin is warm and dry.  Neurological:     Mental Status: She is alert.     Sensory: No sensory deficit.     Comments: Motor, sensation, and vascular distal to the injury is fully intact.      ED Results / Procedures / Treatments   Labs (all labs ordered are listed, but only abnormal results are displayed) Labs Reviewed - No data to display  EKG None  Radiology DG Wrist Complete Left  Result Date: 05/17/2021 CLINICAL DATA:  Fall EXAM: LEFT WRIST - COMPLETE 3+ VIEW COMPARISON:  10/03/2020 FINDINGS: Comminuted transverse fracture distal radius with dorsal displacement. Fracture likely extends into the wrist joint. The ulnar styloid is fractured but not displaced. Normal carpal bones. IMPRESSION: Comminuted fracture distal radius with dorsal displacement. Small avulsion fracture of the ulnar styloid. Electronically Signed   By: Marlan Palau M.D.   On:  05/17/2021 11:55    Procedures Procedures   Medications Ordered in ED Medications  oxyCODONE-acetaminophen (PERCOCET/ROXICET) 5-325 MG per tablet 1 tablet (1 tablet Oral Given 05/17/21 1402)     ED Course  I have reviewed the triage vital signs and the nursing notes.  Pertinent labs & imaging results that were available during my care of the patient were reviewed by me and considered in my medical decision making (see chart for details).  Patient seen and examined. Work-up initiated. Medications ordered.   Vital signs reviewed and are as follows: BP 122/78 (BP Location: Left Arm)   Pulse 78   Temp 98 F (36.7 C) (Oral)   Resp 18   SpO2 98%   Discussed with Josie Dixon of ortho. He has reviewed x-rays. Plan ortho f/u with Dr. Merlyn Lot. Reccs sugartong splint.   5:00 PM patient read check.  She continues to do well.  No signs of neurovascular compromise on recheck.  We discussed plan with orthopedic follow-up.  Small amount of pain medication written for home.  Use pain medication only under direct supervision at the lowest possible dose needed to control your pain.      MDM Rules/Calculators/A&P                          Patient with mechanical fall while in her yard today.  She has a closed distal left radial fracture without any signs of neurovascular compromise.  Patient splinted, Ortho follow-up obtained.   Final Clinical Impression(s) / ED Diagnoses Final diagnoses:  Closed fracture of distal end of left radius, unspecified fracture morphology, initial encounter    Rx / DC Orders ED Discharge Orders         Ordered    oxyCODONE-acetaminophen (PERCOCET/ROXICET) 5-325 MG tablet  Every 6 hours PRN        05/17/21 1651           Renne Crigler, PA-C 05/17/21 1701    Sabino Donovan, MD 05/17/21 [phone]

## 2021-05-23 ENCOUNTER — Encounter: Payer: Self-pay | Admitting: Internal Medicine

## 2021-05-23 ENCOUNTER — Other Ambulatory Visit: Payer: Self-pay

## 2021-05-23 ENCOUNTER — Encounter: Payer: Self-pay | Admitting: *Deleted

## 2021-05-23 ENCOUNTER — Ambulatory Visit (INDEPENDENT_AMBULATORY_CARE_PROVIDER_SITE_OTHER): Payer: Medicare Other | Admitting: Internal Medicine

## 2021-05-23 VITALS — BP 96/50 | HR 89 | Ht <= 58 in | Wt 169.0 lb

## 2021-05-23 DIAGNOSIS — I4819 Other persistent atrial fibrillation: Secondary | ICD-10-CM | POA: Diagnosis not present

## 2021-05-23 DIAGNOSIS — R001 Bradycardia, unspecified: Secondary | ICD-10-CM

## 2021-05-23 DIAGNOSIS — G4733 Obstructive sleep apnea (adult) (pediatric): Secondary | ICD-10-CM | POA: Diagnosis not present

## 2021-05-23 DIAGNOSIS — I5022 Chronic systolic (congestive) heart failure: Secondary | ICD-10-CM

## 2021-05-23 DIAGNOSIS — I493 Ventricular premature depolarization: Secondary | ICD-10-CM | POA: Diagnosis not present

## 2021-05-23 NOTE — H&P (View-Only) (Signed)
PCP: Latrelle Dodrill, MD Primary Cardiologist: Dr Gala Romney Primary EP:  Dr Johney Frame  Misty Gray is a 70 y.o. female who presents today for routine electrophysiology followup.  Since last being seen in our clinic, the patient reports doing very well.  Today, she denies symptoms of palpitations, chest pain, shortness of breath,  lower extremity edema, dizziness, presyncope, or syncope.  The patient is otherwise without complaint today.   Past Medical History:  Diagnosis Date  . Abnormal CT of the head 12/16/1984   R parietal atrophy  . Allergic rhinitis, cause unspecified   . Altered mental status 11/28/2016  . Anemia   . Arthritis    "hands and knees" (09/06/2015)  . Atrial fibrillation (HCC)   . Cellulitis 09/07/2015  . Cellulitis and abscess of leg 09/2016   bilateral  . CHF (congestive heart failure) (HCC)   . Diaphragmatic hernia without mention of obstruction or gangrene   . Dyspnea   . Exertional dyspnea 06/22/12  . Family history of adverse reaction to anesthesia    "daughter fights w/them; just can't relax during OR; stay awake during the OR"  . Fatigue 06/22/2012  . Female stress incontinence   . GERD (gastroesophageal reflux disease)   . Grand mal seizure (HCC)   . H/O hiatal hernia    two removed  . Heart murmur   . High cholesterol   . History of blood transfusion 1956   "related to nose bleed"  . Hypertension   . Influenza A 01/11/2014  . Lichenification and lichen simplex chronicus   . Migraine    "when I was a teenager"  . Morbid obesity (HCC)   . Nonischemic cardiomyopathy (HCC)    EF has normalized-repeat Pending   . On home oxygen therapy    "2L when I'm asleep in bed" (09/06/2015)  . OSA on CPAP   . Pacemaker  st judes    Patient states "this is my third pacemaker"  . Pneumonia 2004; 2011; 11/2011  . Seizures (HCC) since 1981   "can hear you talking but sounds like you are in big tunnel; have them often if not taking RX; last one was 06/17/12"  (09/06/2015)  . Shortness of breath 10/24/2010   Qualifier: Diagnosis of  By: [place], Bonnie    . Sick sinus syndrome (HCC)    WITH PRIOR DDD PACEMAKER IMPLANTATION  . Somnolence   . Stroke [facility]) 1988   "mouth drawed real bad on my left side; found out it was a seizure"  . Syncope and collapse 06/22/12   "hit forehead and left knee"; denies loss of consciousness  . Unspecified venous (peripheral) insufficiency    Past Surgical History:  Procedure Laterality Date  . APPENDECTOMY    . CARDIAC CATHETERIZATION N/A 10/03/2016   Procedure: Right/Left Heart Cath and Coronary Angiography;  Surgeon: Dolores Patty, MD;  Location: [facility] INVASIVE CV LAB;  Service: Cardiovascular;  Laterality: N/A;  . HERNIA REPAIR  ? 1988; 04/15/1998  . INSERT / REPLACE / REMOVE PACEMAKER  12/16/1990   DDD EF 30%;  Marland Kitchen INSERT / REPLACE / REMOVE PACEMAKER  07/25/2003   replaced by BB, leads are both IS1 and from 1992  . INSERT / REPLACE / REMOVE PACEMAKER  05/21/2011   JA; generator change  . LEG SKIN LESION  BIOPSY / EXCISION  05/16/2001   Lichen Planus  . VENTRAL HERNIA REPAIR  1989; 04/15/1998    ROS- all systems are reviewed and negative except as per  HPI above  Current Outpatient Medications  Medication Sig Dispense Refill  . amiodarone (PACERONE) 200 MG tablet Take 0.5 tablets (100 mg total) by mouth daily. 45 tablet 3  . cholecalciferol (VITAMIN D) 25 MCG (1000 UNIT) tablet Take 1 tablet (1,000 Units total) by mouth daily. 28 tablet 1  . doxycycline (VIBRAMYCIN) 100 MG capsule Take 1 capsule (100 mg total) by mouth 2 (two) times daily. One po bid x 7 days 14 capsule 0  . edoxaban (SAVAYSA) 60 MG TABS tablet Take 60 mg by mouth daily. 30 tablet 11  . fluticasone (FLONASE) 50 MCG/ACT nasal spray Place 2 sprays into both nostrils daily as needed. For congestion. 16 g 1  . losartan (COZAAR) 25 MG tablet Take 0.5 tablets (12.5 mg total) by mouth daily. 15 tablet 6  . omeprazole (PRILOSEC) 20 MG capsule TAKE ONE CAPSULE  BY MOUTH EVERY MORNING 30 capsule 2  . oxyCODONE-acetaminophen (PERCOCET/ROXICET) 5-325 MG tablet Take 0.5-1 tablets by mouth every 6 (six) hours as needed for severe pain. 5 tablet 0  . PHENobarbital (LUMINAL) 64.8 MG tablet TAKE ONE TABLET BY MOUTH TWICE DAILY 56 tablet 3  . potassium chloride (KLOR-CON) 10 MEQ tablet TAKE 4 TABLETS BY MOUTH THREE TIMES DAILY (MORNING, NOON, AND NIGHT) 360 tablet 6  . spironolactone (ALDACTONE) 25 MG tablet Take 1 tablet (25 mg total) by mouth daily. 30 tablet 6  . torsemide (DEMADEX) 20 MG tablet Take 4 tablets (80 mg total) by mouth in the morning AND 3 tablets (60 mg total) every evening. 210 tablet 6   No current facility-administered medications for this visit.    Physical Exam: Vitals:   05/23/21 1356  BP: (!) 96/50  Pulse: 89  SpO2: 96%  Weight: 169 lb (76.7 kg)  Height: 4\' 9"  (1.448 m)    GEN- The patient is well appearing, alert and oriented x 3 today.   Head- normocephalic, atraumatic Eyes-  Sclera clear, conjunctiva pink Ears- hearing intact Oropharynx- clear Lungs- Clear to ausculation bilaterally, normal work of breathing Chest- pacemaker pocket is well healed Heart- Regular rate and rhythm, no murmurs, rubs or gallops, PMI not laterally displaced GI- soft, NT, ND, + BS Extremities- no clubbing, cyanosis, or edema  Pacemaker interrogation- reviewed in detail today,  See PACEART report  ekg tracing ordered  today is personally reviewed and shows afib, QRS 102 msec  Echo shows EF 25-30% 7/21  Assessment and Plan:  1. Symptomatic bradycardia  Normal pacemaker function She has reached ERI V paces <20% Narrow QRS and therefore no indication for upgrade to CRT. She does have EF <35%.  I spoke at length with the patient today about generator change with PPM vs upgrade to an ICD.  She is clear at this time that she does NOT wish to have an upgrade to ICD.  Using a shared decision making process, she would prefer to just have a PPM  generator change. See 8/21 Art report  Risks, benefits, and alternatives to PPM  pulse generator replacement were discussed in detail today.  The patient understands that risks include but are not limited to bleeding, infection, pneumothorax, perforation, tamponade, vascular damage, renal failure, MI, stroke, death, damage to his existing leads, and lead dislodgement and wishes to proceed.  We will therefore schedule the procedure at the next available time.  2. Longstanding persistent afib Rate controlled He has severe LA enlargement and is not a candidate for ablation.   Hold savaysa night before and morning of generator change  Consider stopping amiodarone.    3. Chronic systolic dysfunction QRS < 130 msec and V paces <20%.  Not a candidate for CRT.  Does not wish to have an ICD Continue medical therapy  4. OSA Uses CPAP   [name] MD, [facility] 05/23/2021 2:06 PM

## 2021-05-23 NOTE — Progress Notes (Signed)
PCP: Latrelle Dodrill, MD Primary Cardiologist: Dr Gala Romney Primary EP:  Dr Johney Frame  Misty Gray is a 70 y.o. female who presents today for routine electrophysiology followup.  Since last being seen in our clinic, the patient reports doing very well.  Today, she denies symptoms of palpitations, chest pain, shortness of breath,  lower extremity edema, dizziness, presyncope, or syncope.  The patient is otherwise without complaint today.   Past Medical History:  Diagnosis Date  . Abnormal CT of the head 12/16/1984   R parietal atrophy  . Allergic rhinitis, cause unspecified   . Altered mental status 11/28/2016  . Anemia   . Arthritis    "hands and knees" (09/06/2015)  . Atrial fibrillation (HCC)   . Cellulitis 09/07/2015  . Cellulitis and abscess of leg 09/2016   bilateral  . CHF (congestive heart failure) (HCC)   . Diaphragmatic hernia without mention of obstruction or gangrene   . Dyspnea   . Exertional dyspnea 06/22/12  . Family history of adverse reaction to anesthesia    "daughter fights w/them; just can't relax during OR; stay awake during the OR"  . Fatigue 06/22/2012  . Female stress incontinence   . GERD (gastroesophageal reflux disease)   . Grand mal seizure (HCC)   . H/O hiatal hernia    two removed  . Heart murmur   . High cholesterol   . History of blood transfusion 1956   "related to nose bleed"  . Hypertension   . Influenza A 01/11/2014  . Lichenification and lichen simplex chronicus   . Migraine    "when I was a teenager"  . Morbid obesity (HCC)   . Nonischemic cardiomyopathy (HCC)    EF has normalized-repeat Pending   . On home oxygen therapy    "2L when I'm asleep in bed" (09/06/2015)  . OSA on CPAP   . Pacemaker  st judes    Patient states "this is my third pacemaker"  . Pneumonia 2004; 2011; 11/2011  . Seizures (HCC) since 1981   "can hear you talking but sounds like you are in big tunnel; have them often if not taking RX; last one was 06/17/12"  (09/06/2015)  . Shortness of breath 10/24/2010   Qualifier: Diagnosis of  By: [place], Bonnie    . Sick sinus syndrome (HCC)    WITH PRIOR DDD PACEMAKER IMPLANTATION  . Somnolence   . Stroke [facility]) 1988   "mouth drawed real bad on my left side; found out it was a seizure"  . Syncope and collapse 06/22/12   "hit forehead and left knee"; denies loss of consciousness  . Unspecified venous (peripheral) insufficiency    Past Surgical History:  Procedure Laterality Date  . APPENDECTOMY    . CARDIAC CATHETERIZATION N/A 10/03/2016   Procedure: Right/Left Heart Cath and Coronary Angiography;  Surgeon: Dolores Patty, MD;  Location: [facility] INVASIVE CV LAB;  Service: Cardiovascular;  Laterality: N/A;  . HERNIA REPAIR  ? 1988; 04/15/1998  . INSERT / REPLACE / REMOVE PACEMAKER  12/16/1990   DDD EF 30%;  Marland Kitchen INSERT / REPLACE / REMOVE PACEMAKER  07/25/2003   replaced by BB, leads are both IS1 and from 1992  . INSERT / REPLACE / REMOVE PACEMAKER  05/21/2011   JA; generator change  . LEG SKIN LESION  BIOPSY / EXCISION  05/16/2001   Lichen Planus  . VENTRAL HERNIA REPAIR  1989; 04/15/1998    ROS- all systems are reviewed and negative except as per  HPI above  Current Outpatient Medications  Medication Sig Dispense Refill  . amiodarone (PACERONE) 200 MG tablet Take 0.5 tablets (100 mg total) by mouth daily. 45 tablet 3  . cholecalciferol (VITAMIN D) 25 MCG (1000 UNIT) tablet Take 1 tablet (1,000 Units total) by mouth daily. 28 tablet 1  . doxycycline (VIBRAMYCIN) 100 MG capsule Take 1 capsule (100 mg total) by mouth 2 (two) times daily. One po bid x 7 days 14 capsule 0  . edoxaban (SAVAYSA) 60 MG TABS tablet Take 60 mg by mouth daily. 30 tablet 11  . fluticasone (FLONASE) 50 MCG/ACT nasal spray Place 2 sprays into both nostrils daily as needed. For congestion. 16 g 1  . losartan (COZAAR) 25 MG tablet Take 0.5 tablets (12.5 mg total) by mouth daily. 15 tablet 6  . omeprazole (PRILOSEC) 20 MG capsule TAKE ONE CAPSULE  BY MOUTH EVERY MORNING 30 capsule 2  . oxyCODONE-acetaminophen (PERCOCET/ROXICET) 5-325 MG tablet Take 0.5-1 tablets by mouth every 6 (six) hours as needed for severe pain. 5 tablet 0  . PHENobarbital (LUMINAL) 64.8 MG tablet TAKE ONE TABLET BY MOUTH TWICE DAILY 56 tablet 3  . potassium chloride (KLOR-CON) 10 MEQ tablet TAKE 4 TABLETS BY MOUTH THREE TIMES DAILY (MORNING, NOON, AND NIGHT) 360 tablet 6  . spironolactone (ALDACTONE) 25 MG tablet Take 1 tablet (25 mg total) by mouth daily. 30 tablet 6  . torsemide (DEMADEX) 20 MG tablet Take 4 tablets (80 mg total) by mouth in the morning AND 3 tablets (60 mg total) every evening. 210 tablet 6   No current facility-administered medications for this visit.    Physical Exam: Vitals:   05/23/21 1356  BP: (!) 96/50  Pulse: 89  SpO2: 96%  Weight: 169 lb (76.7 kg)  Height: 4\' 9"  (1.448 m)    GEN- The patient is well appearing, alert and oriented x 3 today.   Head- normocephalic, atraumatic Eyes-  Sclera clear, conjunctiva pink Ears- hearing intact Oropharynx- clear Lungs- Clear to ausculation bilaterally, normal work of breathing Chest- pacemaker pocket is well healed Heart- Regular rate and rhythm, no murmurs, rubs or gallops, PMI not laterally displaced GI- soft, NT, ND, + BS Extremities- no clubbing, cyanosis, or edema  Pacemaker interrogation- reviewed in detail today,  See PACEART report  ekg tracing ordered  today is personally reviewed and shows afib, QRS 102 msec  Echo shows EF 25-30% 7/21  Assessment and Plan:  1. Symptomatic bradycardia  Normal pacemaker function She has reached ERI V paces <20% Narrow QRS and therefore no indication for upgrade to CRT. She does have EF <35%.  I spoke at length with the patient today about generator change with PPM vs upgrade to an ICD.  She is clear at this time that she does NOT wish to have an upgrade to ICD.  Using a shared decision making process, she would prefer to just have a PPM  generator change. See 8/21 Art report  Risks, benefits, and alternatives to PPM  pulse generator replacement were discussed in detail today.  The patient understands that risks include but are not limited to bleeding, infection, pneumothorax, perforation, tamponade, vascular damage, renal failure, MI, stroke, death, damage to his existing leads, and lead dislodgement and wishes to proceed.  We will therefore schedule the procedure at the next available time.  2. Longstanding persistent afib Rate controlled He has severe LA enlargement and is not a candidate for ablation.   Hold savaysa night before and morning of generator change  Consider stopping amiodarone.    3. Chronic systolic dysfunction QRS < 130 msec and V paces <20%.  Not a candidate for CRT.  Does not wish to have an ICD Continue medical therapy  4. OSA Uses CPAP   [name] MD, [facility] 05/23/2021 2:06 PM

## 2021-05-23 NOTE — Patient Instructions (Addendum)
Medication Instructions:  Your physician recommends that you continue on your current medications as directed. Please refer to the Current Medication list given to you today.  Labwork: CBC, BMP  Testing/Procedures: Your physician has recommended that you have a pacemaker inserted. A pacemaker is a small device that is placed under the skin of your chest or abdomen to help control abnormal heart rhythms. This device uses electrical pulses to prompt the heart to beat at a normal rate. Pacemakers are used to treat heart rhythms that are too slow. Wire (leads) are attached to the pacemaker that goes into the chambers of you heart. This is done in the hospital and usually requires and overnight stay. Please see the instruction sheet given to you today for more information.  Remote monitoring is used to monitor your Pacemaker from home. This monitoring reduces the number of office visits required to check your device to one time per year. It allows [place] to keep an eye on the functioning of your device to ensure it is working properly. You are scheduled for a device check from home on 06/11/21. You may send your transmission at any time that day. If you have a wireless device, the transmission will be sent automatically. After your physician reviews your transmission, you will receive a postcard with your next transmission date.  Any Other Special Instructions Will Be Listed Below (If Applicable).  If you need a refill on your cardiac medications before your next appointment, please call your pharmacy.    Pacemaker Battery Change  A pacemaker battery usually lasts 5-15 years (6-7 years on average). A few times a year, you may be asked to visit your health care provider to have a full evaluation of your pacemaker. When the battery is low, your pacemaker will be completely replaced. Most often, this procedure is simpler than the first surgery because the wires (leads) that connect the pacemaker to the heart  are already in place. There are many things that affect how long a pacemaker battery will last, including:  The age of the pacemaker.  The number of leads you have(1, 2, or 3).  The use or workload of the pacemaker. If the pacemaker is helping the heart more often, the battery will not last as long.  Power (voltage) settings. Tell a health care provider about:  Any allergies you have.  All medicines you are taking, including vitamins, herbs, eye drops, creams, and over-the-counter medicines.  Any problems you or family members have had with anesthetic medicines.  Any blood disorders you have.  Any surgeries you have had, especially any surgeries you have had since your last pacemaker was placed.  Any medical conditions you have.  Whether you are pregnant or may be pregnant. What are the risks? Generally, this is a safe procedure. However, problems may occur, including:  Bleeding.  Infection.  Nerve damage.  Allergic reaction to medicines.  Damage to the leads that go to the heart. What happens before the procedure? Staying hydrated Follow instructions from your health care provider about hydration, which may include:  Up to 2 hours before the procedure - you may continue to drink clear liquids, such as water, clear fruit juice, black coffee, and plain tea. Eating and drinking restrictions Follow instructions from your health care provider about eating and drinking restrictions, which may include:  8 hours before the procedure - stop eating heavy meals or foods, such as meat, fried foods, or fatty foods.  6 hours before the procedure - stop  eating light meals or foods, such as toast or cereal.  6 hours before the procedure - stop drinking milk or drinks that contain milk.  2 hours before the procedure - stop drinking clear liquids. Medicines Ask your health care provider about:  Changing or stopping your regular medicines. This is especially important if you are  taking diabetes medicines or blood thinners.  Taking medicines such as aspirin and ibuprofen. These medicines can thin your blood. Do not take these medicines unless your health care provider tells you to take them.  Taking over-the-counter medicines, vitamins, herbs, and supplements. General instructions  Ask your health care provider what steps will be taken to help prevent infection. These may include: ? Removing hair at the surgery site. ? Washing skin with a germ-killing soap. ? Receiving antibiotic medicine.  Plan to have someone take you home from the hospital or clinic.  If you will be going home right after the procedure, plan to have someone with you for 24 hours. What happens during the procedure?  An IV will be inserted into one of your veins.  You will be given one or more of the following: ? A medicine to help you relax (sedative). ? A medicine to numb the area where the pacemaker is located (local anesthetic).  Your health care provider will make an incision to reopen the pocket holding the pacemaker.  The old pacemaker will be disconnected from the leads.  The leads will be tested.  If needed, the leads will be replaced. If the leads are functioning properly, the new pacemaker will be connected to the existing leads.  A heart monitor and a pacemaker programmer will be used to make sure that the newly implanted pacemaker is working properly.  The incision site will be closed with stitches (sutures), adhesive strips, or skin glue.  A bandage (dressing) will be placed over the pacemaker site. The procedure may vary among health care providers and hospitals. What happens after the procedure?  Your blood pressure, heart rate, breathing rate, and blood oxygen level will be monitored until you leave the hospital or clinic.  You may be given antibiotics.  Your health care provider will tell you when your pacemaker will need to be tested again, or when to return to  the office for removal of the dressing and sutures.  If you were given a sedative during the procedure, it can affect you for several hours. Do not drive or operate machinery until your health care provider says that it is safe.  You will be given a pacemaker identification card. This card lists the implant date, device model, and manufacturer of your pacemaker. Summary  A pacemaker battery usually lasts 5-15 years (6-7 years on average).  When the battery is low, your pacemaker will need to be replaced.  Most often, this procedure is simpler than the first surgery because the wires (leads) that connect the pacemaker to the heart are already in place.  Risks of this procedure include bleeding, infection, and allergic reactions to medicines. This information is not intended to replace advice given to you by your health care provider. Make sure you discuss any questions you have with your health care provider. Document Revised: 11/04/2019 Document Reviewed: 11/04/2019 Elsevier Patient Education  2021 [employer].

## 2021-05-24 LAB — CBC WITH DIFFERENTIAL/PLATELET
Basophils Absolute: 0 10*3/uL (ref 0.0–0.2)
Basos: 0 %
EOS (ABSOLUTE): 0 10*3/uL (ref 0.0–0.4)
Eos: 0 %
Hematocrit: 40.3 % (ref 34.0–46.6)
Hemoglobin: 13.7 g/dL (ref 11.1–15.9)
Immature Grans (Abs): 0 10*3/uL (ref 0.0–0.1)
Immature Granulocytes: 0 %
Lymphocytes Absolute: 1.3 10*3/uL (ref 0.7–3.1)
Lymphs: 16 %
MCH: 31.1 pg (ref 26.6–33.0)
MCHC: 34 g/dL (ref 31.5–35.7)
MCV: 91 fL (ref 79–97)
Monocytes Absolute: 0.8 10*3/uL (ref 0.1–0.9)
Monocytes: 10 %
Neutrophils Absolute: 6 10*3/uL (ref 1.4–7.0)
Neutrophils: 74 %
Platelets: 184 10*3/uL (ref 150–450)
RBC: 4.41 x10E6/uL (ref 3.77–5.28)
RDW: 13 % (ref 11.7–15.4)
WBC: 8.2 10*3/uL (ref 3.4–10.8)

## 2021-05-24 LAB — BASIC METABOLIC PANEL
BUN/Creatinine Ratio: 35 — ABNORMAL HIGH (ref 12–28)
BUN: 34 mg/dL — ABNORMAL HIGH (ref 8–27)
CO2: 24 mmol/L (ref 20–29)
Calcium: 9.8 mg/dL (ref 8.7–10.3)
Chloride: 98 mmol/L (ref 96–106)
Creatinine, Ser: 0.98 mg/dL (ref 0.57–1.00)
Glucose: 92 mg/dL (ref 65–99)
Potassium: 4.1 mmol/L (ref 3.5–5.2)
Sodium: 137 mmol/L (ref 134–144)
eGFR: 62 mL/min/{1.73_m2} (ref >59)

## 2021-05-28 ENCOUNTER — Other Ambulatory Visit (HOSPITAL_COMMUNITY): Payer: Self-pay

## 2021-05-28 NOTE — Progress Notes (Signed)
Paramedicine Encounter    Patient ID: Misty Gray, female    DOB: [birth year], 70 y.o.   MRN: [id number]   Patient Care Team: Latrelle Dodrill, MD as PCP - General (Family Medicine) [name], MD (Cardiology) Burna Sis, LCSW as Social Worker (Licensed Clinical Social Worker)  Patient Active Problem List   Diagnosis Date Noted   Hand injury, left, initial encounter 09/22/2020   CHF exacerbation (HCC) 07/12/2020   Hospital discharge follow-up 11/17/2019   Onychomycosis 11/17/2019   Abdominal wall hernia 11/05/2017   Anticoagulated 11/05/2017   Rash and nonspecific skin eruption 09/15/2017   Ventral hernia without obstruction or gangrene 08/15/2017   Skin irritation 07/23/2017   Chronic venous stasis dermatitis 12/26/2016   Urinary tract infection without hematuria    Frequent PVCs    Hypokalemia    Housing problems 11/23/2016   Chronic atrial fibrillation (HCC) 11/01/2016   Hypotension 10/31/2016   Healthcare maintenance 08/12/2016   Heme positive stool 08/09/2016   Intertrigo 08/09/2016   Chronic anticoagulation 04/26/2016   Chronic venous insufficiency    Breast mass, left 10/05/2014   Osteopenia 09/15/2014   Hypertension 01/04/2014   Chronic systolic dysfunction of left ventricle 05/05/2013   At high risk for falls 02/11/2013   Vitamin D deficiency 07/15/2012   Sinoatrial node dysfunction (HCC) 05/08/2011   Pacemaker 08/10/2009   Allergic rhinitis 05/31/2008   Morbid obesity (HCC) 02/12/2007   GASTROESOPHAGEAL REFLUX, NO ESOPHAGITIS 02/12/2007   Female stress incontinence 02/12/2007   Seizure disorder (HCC) 02/12/2007   OSA (obstructive sleep apnea) 02/12/2007    Current Outpatient Medications:    amiodarone (PACERONE) 200 MG tablet, Take 0.5 tablets (100 mg total) by mouth daily., Disp: 45 tablet, Rfl: 3   cholecalciferol (VITAMIN D) 25 MCG (1000 UNIT) tablet, Take 1 tablet (1,000 Units total) by mouth daily., Disp: 28 tablet, Rfl: 1   edoxaban  (SAVAYSA) 60 MG TABS tablet, Take 60 mg by mouth daily., Disp: 30 tablet, Rfl: 11   fluticasone (FLONASE) 50 MCG/ACT nasal spray, Place 2 sprays into both nostrils daily as needed. For congestion., Disp: 16 g, Rfl: 1   losartan (COZAAR) 25 MG tablet, Take 0.5 tablets (12.5 mg total) by mouth daily., Disp: 15 tablet, Rfl: 6   omeprazole (PRILOSEC) 20 MG capsule, TAKE ONE CAPSULE BY MOUTH EVERY MORNING, Disp: 30 capsule, Rfl: 2   oxyCODONE-acetaminophen (PERCOCET/ROXICET) 5-325 MG tablet, Take 0.5-1 tablets by mouth every 6 (six) hours as needed for severe pain., Disp: 5 tablet, Rfl: 0   PHENobarbital (LUMINAL) 64.8 MG tablet, TAKE ONE TABLET BY MOUTH TWICE DAILY, Disp: 56 tablet, Rfl: 3   potassium chloride (KLOR-CON) 10 MEQ tablet, TAKE 4 TABLETS BY MOUTH THREE TIMES DAILY (MORNING, NOON, AND NIGHT), Disp: 360 tablet, Rfl: 6   spironolactone (ALDACTONE) 25 MG tablet, Take 1 tablet (25 mg total) by mouth daily., Disp: 30 tablet, Rfl: 6   torsemide (DEMADEX) 20 MG tablet, Take 4 tablets (80 mg total) by mouth in the morning AND 3 tablets (60 mg total) every evening., Disp: 210 tablet, Rfl: 6   doxycycline (VIBRAMYCIN) 100 MG capsule, Take 1 capsule (100 mg total) by mouth 2 (two) times daily. One po bid x 7 days (Patient not taking: Reported on 05/28/2021), Disp: 14 capsule, Rfl: 0 Allergies  Allergen Reactions   Aspirin Hives and Rash   Penicillins Rash and Other (See Comments)    Has patient had a PCN reaction causing immediate rash, facial/tongue/throat swelling, SOB or lightheadedness with hypotension:  Yes Has patient had a PCN reaction causing severe rash involving mucus membranes or skin necrosis: No Has patient had a PCN reaction that required hospitalization No Has patient had a PCN reaction occurring within the last 10 years: Yes If all of the above answers are "NO", then may proceed with Cephalosporin use.  Patient has tolerated cephalosporins in 2017.   Tape Other (See Comments)    NO  PAPER TAPE-MUST BE MEDICAL TAPE - unknown reaction NO PAPER TAPE-MUST BE MEDICAL TAPE - unknown reaction   Wool Alcohol [Lanolin] Hives   Amoxicillin Rash    Patient has tolerated cephalosporins   Ampicillin Itching and Rash    Patient has tolerated cephalosporins      Social History   Socioeconomic History   Marital status: Married    Spouse name: [name]   Number of children: 4   Years of education: 12   Highest education level: Not on file  Occupational History   Occupation: disabled  Tobacco Use   Smoking status: Former    Packs/day: 1.00    Years: 7.00    Pack years: 7.00    Types: Cigarettes    Quit date: 03/16/1969    Years since quitting: 52.2   Smokeless tobacco: Never  Vaping Use   Vaping Use: Never used  Substance and Sexual Activity   Alcohol use: No   Drug use: No   Sexual activity: Not Currently    Birth control/protection: Post-menopausal  Other Topics Concern   Not on file  Social History Narrative   Lives with husband [name] lives with them as do his 2 children   Daughter, [name], and her 3 children live in [place]   Son -  [name] is incarcerated   Nephew, Lawrencia Mauney lives with them   Daughter, [name], in [place] - [name]   Moved June 2013 to a better home on [address]      Lives with husband, son, grandson, granddaughter, nephew.   Social Determinants of Health   Financial Resource Strain: Medium Risk   Difficulty of Paying Living Expenses: Somewhat hard  Food Insecurity: No Food Insecurity   Worried About Programme researcher, broadcasting/film/video in the Last Year: Never true   Ran Out of Food in the Last Year: Never true  Transportation Needs: No Transportation Needs   Lack of Transportation (Medical): No   Lack of Transportation (Non-Medical): No  Physical Activity: Not on file  Stress: Not on file  Social Connections: Not on file  Intimate Partner Violence: Not on file    Physical  Exam      Future Appointments  Date Time Provider Department Center  05/29/2021  1:50 PM Towanda Octave, MD [facility]  05/30/2021  1:40 PM [name], [name], MD [facility] None  06/11/2021  7:55 AM [facility] DEVICE REMOTES [name]  06/21/2021 10:40 AM [facility] DEVICE 1 [name]  09/17/2021  2:15 PM Allred, Fayrene Fearing, MD [name]    BP 100/60   Pulse 68   Resp 18   Wt 172 lb (78 kg)   SpO2 98%   BMI 37.22 kg/m   Weight yesterday-? Last visit weight-173  Pt suffered a fall last week and broke her arm. She is to f/u with ortho care. She is going to ortho tomor. Her son is taking her.  She also has to get her battery replaced in her device on 6/24.  Her daughter tried to  fill her pill box, not sure why--she didn't run out of anything. There were a few errors, I advised her to not let anyone else fill her meds. Everything is now correct in her pill box. She dropped the other pill box and it broke.  Meds are verified and pill box refilled. [facility] use.  She has leaking to her rt leg. I advised her to make appointment with her PCP for f/u care. HHN was not avail for her the previous visit and pt was told to come back if anything gotten worse.  I went ahead and called her PCP office to sch that for her. Her PCP is booked though July so 1st available is tomor at 150 to see dr patel.  Pt denies c/p,no sob, no dizziness.   Kerry Hough, EMT-Paramedic [phone] [facility] Paramedic  05/28/21

## 2021-05-29 ENCOUNTER — Encounter: Payer: Self-pay | Admitting: Family Medicine

## 2021-05-29 ENCOUNTER — Other Ambulatory Visit: Payer: Self-pay

## 2021-05-29 ENCOUNTER — Ambulatory Visit (INDEPENDENT_AMBULATORY_CARE_PROVIDER_SITE_OTHER): Payer: Medicare Other | Admitting: Family Medicine

## 2021-05-29 ENCOUNTER — Other Ambulatory Visit: Payer: Self-pay | Admitting: Family Medicine

## 2021-05-29 VITALS — BP 126/76 | HR 70 | Wt 170.0 lb

## 2021-05-29 DIAGNOSIS — I519 Heart disease, unspecified: Secondary | ICD-10-CM | POA: Diagnosis not present

## 2021-05-29 DIAGNOSIS — I5020 Unspecified systolic (congestive) heart failure: Secondary | ICD-10-CM

## 2021-05-29 DIAGNOSIS — L0390 Cellulitis, unspecified: Secondary | ICD-10-CM | POA: Diagnosis not present

## 2021-05-29 DIAGNOSIS — S52552A Other extraarticular fracture of lower end of left radius, initial encounter for closed fracture: Secondary | ICD-10-CM | POA: Diagnosis not present

## 2021-05-29 MED ORDER — DOXYCYCLINE HYCLATE 100 MG PO CAPS: 100.0000 mg | ORAL_CAPSULE | Freq: Two times a day (BID) | ORAL | 0 refills | Status: AC

## 2021-05-29 NOTE — Progress Notes (Signed)
Advanced [facility] Failure Clinic Note  Heart Failure Date:  05/30/2021   ID:  Misty Gray, DOB 04/01/1951, MRN [id number]  Location: Home  Provider location: [facility] Type of Visit: Established patient  PCP:  Latrelle Dodrill, MD- Internal Medicine   Cardiologist:  None Primary HF: Dr. Gala Romney EP: Dr Johney Frame   Chief Complaint: F/u for Chronic Systolic Heart Failure    History of Present Illness:  [name] is a 70 y/o woman with history of morbid obesity, OSA, chronic atrial fibrillation with tachy-brady syndrome s/p PPM, frequent PVCs, systolic HF due to NICM.   Admitted for ADHF in 10/17. Cath with normal coronary arteries and elevated filling pressures. Echo 10/17 EF 30-35% (previously 35-40%). Felt to have possible PVC or RV pacing cardiomyopathy. (She had 81% RV pacing and 15-20 PVCs per minute). Started on po amio with suppression of PVCs. Diuresed 41 pounds. Discharge weight 197.    Admitted 12/14 through 12/06/2016 with marked volume overload. Discharge weight was 195 pounds.Transitioned to torsemide 40 mg in am and 20 mg in pm.   Echo 07/2017 LVEF 25-30%, Trivial AI, Mod MR, Severe LAE, Mild RV dilation, Mild RAE, PA peak pressure 31 mm Hg.  Echo 1/20 EF 25-30% (no improvement with PVC suppression)  Admitted 07/2020 with A/C systolic heart failure. Diuresed 21 pounds. RV [pacing 28% Decreased backup pacing rate to 50 bpm from 70 bpm.   Recently seen in ER with LE cellulitis and low K. Started on doxy. Also saw Dr. Johney Frame. PM at [facility]. Scheduled for gen change. < 20% RV bacing and QRS < 130 so felt not to be candidate for CRT upgrade. Patient did not want ICD. Seen in [facility] clinic yesterday and still with LE cellulitis restarted doxy and referred to wound Clinic  Studies: ECHO 06/2020  EF 25-30% RV moderately reduced and enlarged.   R/LHC 10/03/16 Ao = 94/58 (72) LV = 104/20 RA = 21 RV = 65/4/19 PA = 65/21 (38) PCW = 23 Fick cardiac  output/index = 6.1/2.9 PVR = 2.5 WU FA sat = 97%  PA sat = 69%, 70%  Assessment: 1. Normal coronary arteries 2. NICM with EF 30-35% by echo 3. Persistently elevated biventricular pressures   Past Medical History:  Diagnosis Date   Abnormal CT of the head 12/16/1984   R parietal atrophy   Allergic rhinitis, cause unspecified    Altered mental status 11/28/2016   Anemia    Arthritis    "hands and knees" (09/06/2015)   Atrial fibrillation (HCC)    Cellulitis 09/07/2015   Cellulitis and abscess of leg 09/2016   bilateral   CHF (congestive heart failure) (HCC)    Diaphragmatic hernia without mention of obstruction or gangrene    Dyspnea    Exertional dyspnea 06/22/12   Family history of adverse reaction to anesthesia    "daughter fights w/them; just can't relax during OR; stay awake during the OR"   Fatigue 06/22/2012   Female stress incontinence    GERD (gastroesophageal reflux disease)    Grand mal seizure (HCC)    H/O hiatal hernia    two removed   Heart murmur    High cholesterol    History of blood transfusion 1956   "related to nose bleed"   Hypertension    Influenza A 01/11/2014   Lichenification and lichen simplex chronicus    Migraine    "when I was a teenager"   Morbid obesity (HCC)  Nonischemic cardiomyopathy (HCC)    EF has normalized-repeat Pending    On home oxygen therapy    "2L when I'm asleep in bed" (09/06/2015)   OSA on CPAP    Pacemaker  st judes    Patient states "this is my third pacemaker"   Pneumonia 2004; 2011; 11/2011   Seizures (HCC) since 1981   "can hear you talking but sounds like you are in big tunnel; have them often if not taking RX; last one was 06/17/12" (09/06/2015)   Shortness of breath 10/24/2010   Qualifier: Diagnosis of  By: [place], Bonnie     Sick sinus syndrome (HCC)    WITH PRIOR DDD PACEMAKER IMPLANTATION   Somnolence    Stroke (HCC) 1988   "mouth drawed real bad on my left side; found out it was a seizure"   Syncope and  collapse 06/22/12   "hit forehead and left knee"; denies loss of consciousness   Unspecified venous (peripheral) insufficiency    Past Surgical History:  Procedure Laterality Date   APPENDECTOMY     CARDIAC CATHETERIZATION N/A 10/03/2016   Procedure: Right/Left Heart Cath and Coronary Angiography;  Surgeon: Dolores Patty, MD;  Location: [facility] INVASIVE CV LAB;  Service: Cardiovascular;  Laterality: N/A;   HERNIA REPAIR  ? 1988; 04/15/1998   INSERT / REPLACE / REMOVE PACEMAKER  12/16/1990   DDD EF 30%;   INSERT / REPLACE / REMOVE PACEMAKER  07/25/2003   replaced by BB, leads are both IS1 and from 1992   INSERT / REPLACE / REMOVE PACEMAKER  05/21/2011   JA; generator change   LEG SKIN LESION  BIOPSY / EXCISION  05/16/2001   Lichen Planus   VENTRAL HERNIA REPAIR  1989; 04/15/1998    Current Outpatient Medications  Medication Sig Dispense Refill   amiodarone (PACERONE) 200 MG tablet Take 0.5 tablets (100 mg total) by mouth daily. 45 tablet 3   cephALEXin (KEFLEX) 500 MG capsule Take 1 capsule (500 mg total) by mouth 3 (three) times daily for 7 days. 21 capsule 0   cholecalciferol (VITAMIN D) 25 MCG (1000 UNIT) tablet Take 1 tablet (1,000 Units total) by mouth daily. 28 tablet 1   doxycycline (VIBRAMYCIN) 100 MG capsule Take 1 capsule (100 mg total) by mouth 2 (two) times daily for 7 days. One po bid x 7 days 14 capsule 0   edoxaban (SAVAYSA) 60 MG TABS tablet Take 60 mg by mouth daily. 30 tablet 11   fluticasone (FLONASE) 50 MCG/ACT nasal spray Place 2 sprays into both nostrils daily as needed. For congestion. 16 g 1   losartan (COZAAR) 25 MG tablet Take 0.5 tablets (12.5 mg total) by mouth daily. 15 tablet 6   omeprazole (PRILOSEC) 20 MG capsule TAKE ONE CAPSULE BY MOUTH EVERY MORNING 30 capsule 2   oxyCODONE-acetaminophen (PERCOCET/ROXICET) 5-325 MG tablet Take 0.5-1 tablets by mouth every 6 (six) hours as needed for severe pain. 5 tablet 0   PHENobarbital (LUMINAL) 64.8 MG tablet TAKE ONE TABLET BY  MOUTH TWICE DAILY 56 tablet 3   potassium chloride (KLOR-CON) 10 MEQ tablet TAKE 4 TABLETS BY MOUTH THREE TIMES DAILY (MORNING, NOON, AND NIGHT) 360 tablet 6   spironolactone (ALDACTONE) 25 MG tablet Take 1 tablet (25 mg total) by mouth daily. 30 tablet 6   torsemide (DEMADEX) 20 MG tablet Take 4 tablets (80 mg total) by mouth in the morning AND 3 tablets (60 mg total) every evening. 210 tablet 6   No current facility-administered medications  for this encounter.    Allergies:   Aspirin, Penicillins, Tape, Wool alcohol [lanolin], Amoxicillin, and Ampicillin   Social History:  The patient  reports that she quit smoking about 52 years ago. Her smoking use included cigarettes. She has a 7.00 pack-year smoking history. She has never used smokeless tobacco. She reports that she does not drink alcohol and does not use drugs.   Family History:  The patient's family history includes Cancer in her mother and sister; Diabetes in her son; Heart disease in her father; Hypertension in her brother and sister; Rheum arthritis in her daughter; Sleep apnea in her son; Stroke in her father.   ROS:  Please see the history of present illness.   All other systems are personally reviewed and negative.  Vitals:   05/30/21 1344  BP: 130/78  Pulse: 73  SpO2: 94%    Wt Readings from Last 3 Encounters:  05/30/21 75.6 kg (166 lb 9.6 oz)  05/29/21 77.1 kg (170 lb)  05/28/21 78 kg (172 lb)   ECG: afib, 68 bpm (personally reviewed) Reds Clip: 44%  PHYSICAL EXAM: General:  Obese elderly woman in WC No resp difficulty HEENT: normal Neck: supple. no JVD. Carotids 2+ bilat; no bruits. No lymphadenopathy or thryomegaly appreciated. Cor: PMI nondisplaced. Irregular rate & rhythm. No rubs, gallops or murmurs. Lungs: clear Abdomen: obese soft, nontender, nondistended. No hepatosplenomegaly. No bruits or masses. Good bowel sounds. Extremities: no cyanosis, clubbing, rash, 1+ edema wound on RLE with mild drainage LLE  TR-1+ edema mild erythema  Splint on L wrist  Neuro: alert & orientedx3, cranial nerves grossly intact. moves all 4 extremities w/o difficulty. Affect pleasant    Recent Labs: 07/16/2020: Magnesium 2.0 09/27/2020: TSH 1.175 01/30/2021: ALT 22 05/08/2021: B Natriuretic Peptide 1,550.6 05/23/2021: BUN 34; Creatinine, Ser 0.98; Hemoglobin 13.7; Platelets 184; Potassium 4.1; Sodium 137  Personally reviewed   Wt Readings from Last 3 Encounters:  05/30/21 75.6 kg (166 lb 9.6 oz)  05/29/21 77.1 kg (170 lb)  05/28/21 78 kg (172 lb)    ASSESSMENT AND PLAN:  1. Chronic systolic HF due to NICM:  - ? PVC induced vs RV pacing. Echo 12/06/2016 EF 10-15%. Cath 10/17 normal coronary arteries.  Echo 02/2017 with LVEF 15-20% despite PVC suppression. Echo 07/2017 EF 25-30%.  Trivial AI, Mod MR, Severe LAE, Mild RV dilation, Mild RAE, PA peak pressure 31 mm Hg. Echo 1/20 EF 25-30%. Echo 06/2020 EF 25-30%, RV moderately reduced.  - NYHA III.  - On torsemide 80/60. Volume status looks ok. ReDS 28%  - Continue spiro 25 mg daily   - Continue losartan 12.5 mg daily (did not tolerate higher dose previously due to orthostatic hypotension)  - Digoxin was stopped due to elevated level.    - Would not use SGLT2i given high risk of genital yeast infection - Intolerant to ? blockers. - See device discussion below  2. Chronic AF with tachy-brady s/p pacemaker - Intolerant to ? blockers. Rate controlled.  - Continue amio 100 mg daily. - Continue endoxaban. - Followed by Dr. Johney Frame  - PPM at [facility]. Scheduled for gen change. < 20% RV bacing and QRS < 130 so felt not to be candidate for CRT upgrade. Patient did not want ICD.  - Due for gen change next week. I d/w Dr. Johney Frame will postpone until infection improved  3. Frequent PVCs - Suppressed on amio 100 mg daily.   - EF did not improve with PVC suppression   4. RLE cellulitis/wound -  on doxy per PCP. Has been referred to Wound Clinic - improved but still looks  infected. D/w ID. Will add Keflex - Wound dressed and leg wrapped with ACE bandage. Encouraged elevation - Check ABI   5. Obesity  - Body mass index is 36.05 kg/m.  - Needs weight loss   6. Pre-op surgical clearance for wrist surgery - scheduled to be done with local block - ok to proceed from cardiac standpoint  Total time spent 50 minutes. Over half that time spent discussing above.    Signed, Arvilla Meres, MD  05/30/2021 10:07 PM  [facility] [address] [facility] [place] [address] [phone] (office) [phone] (fax)

## 2021-05-29 NOTE — Patient Instructions (Signed)
Thank you for coming to see me today. It was a pleasure. Today we discussed your leg infection. I think it is cellulitis. I recommend doxycycline 100mg  twice daily for 7 days.  We will get some labs today.  If they are abnormal or we need to do something about them, I will call you.  If they are normal, I will send you a message on MyChart (if it is active) or a letter in the mail.  If you don't hear from in 2 weeks, please call the office at the number below.   Please follow-up with PCP in 1 week.  If you have any questions or concerns, please do not hesitate to call the office at [phone].  Best wishes,   Dr [phone]

## 2021-05-29 NOTE — Progress Notes (Signed)
     SUBJECTIVE:   CHIEF COMPLAINT / HPI:   Misty Gray is a 70 y.o. female presents for concern for cellulitis   Cellulitis of legs Treated for cellulitis on 05/24 with doxycycline 100mg  BID for 7 day. It was clearing up but it starting weeping the day she finished her antibiotics and has become erythematous. Legs are not more obviously swollen compared to normal. Denies fevers and feels well. Pt reports she has been hospitalized due to her cellulitis before requiring IV antibiotics. Denies dyspnea.  Flowsheet Row Office Visit from 05/29/2021 in [facility]  PHQ-9 Total Score 0       PERTINENT  PMH / PSH: Afib, HFrEF   OBJECTIVE:   BP 126/76   Pulse 70   Wt 170 lb (77.1 kg)   BMI 36.79 kg/m    General: Alert, no acute distress, pleasant  Cardio: well perfused Pulm: normal work of breathing Extremities: 3+ peripheral edema bilaterally, 2 x small superficial weeping non purulent wounds on right anterior shins.  Neuro: Cranial nerves grossly intact      ASSESSMENT/PLAN:   Cellulitis Concern for new cellulitis bilaterally likely precipitated by chronic venous stasis and chronic edema 2/2 CHF. Pt typically gets cellulitis of her right leg. Pt is feeling well otherwise and denies fevers. No indications for hospital admission. Hx of MRSA so will treat with Doxcycline 100mg  BID 7 days. Applied non stick gauze pad and secured with Kerlex over 2 superficial wounds. Obtained BMP and CBC. Referred to wound care. Follow up with me in 1 week. Strict ER precautions provided.   Chronic systolic dysfunction of left ventricle Pt will follow up with Dr Borgarnes tomorrow for HFrEF. Obtained BMP and BNP today.     , MD PGY-2 [facility]

## 2021-05-29 NOTE — Assessment & Plan Note (Signed)
Pt will follow up with Dr Ledora Bottcher tomorrow for HFrEF. Obtained BMP and BNP today.

## 2021-05-29 NOTE — Assessment & Plan Note (Addendum)
Concern for new cellulitis bilaterally likely precipitated by chronic venous stasis and chronic edema 2/2 CHF. Pt typically gets cellulitis of her right leg. Pt is feeling well otherwise and denies fevers. No indications for hospital admission. Hx of MRSA so will treat with Doxcycline 100mg  BID 7 days. Applied non stick gauze pad and secured with Kerlex over 2 superficial wounds. Obtained BMP and CBC. Referred to wound care. Follow up with me in 1 week. Strict ER precautions provided.

## 2021-05-30 ENCOUNTER — Ambulatory Visit (HOSPITAL_COMMUNITY)
Admission: RE | Admit: 2021-05-30 | Discharge: 2021-05-30 | Disposition: A | Discharge status: Home / Self Care | Payer: Medicare Other | Source: Ambulatory Visit | Attending: Internal Medicine | Admitting: Internal Medicine

## 2021-05-30 ENCOUNTER — Encounter (HOSPITAL_COMMUNITY): Payer: Self-pay | Admitting: Internal Medicine

## 2021-05-30 ENCOUNTER — Other Ambulatory Visit (HOSPITAL_COMMUNITY): Payer: Self-pay

## 2021-05-30 VITALS — BP 130/78 | HR 73 | Wt 166.6 lb

## 2021-05-30 DIAGNOSIS — I110 Hypertensive heart disease with heart failure: Secondary | ICD-10-CM | POA: Insufficient documentation

## 2021-05-30 DIAGNOSIS — I493 Ventricular premature depolarization: Secondary | ICD-10-CM | POA: Diagnosis not present

## 2021-05-30 DIAGNOSIS — Z79899 Other long term (current) drug therapy: Secondary | ICD-10-CM | POA: Insufficient documentation

## 2021-05-30 DIAGNOSIS — Z7901 Long term (current) use of anticoagulants: Secondary | ICD-10-CM | POA: Diagnosis not present

## 2021-05-30 DIAGNOSIS — Z950 Presence of cardiac pacemaker: Secondary | ICD-10-CM | POA: Insufficient documentation

## 2021-05-30 DIAGNOSIS — Z6836 Body mass index (BMI) 36.0-36.9, adult: Secondary | ICD-10-CM | POA: Insufficient documentation

## 2021-05-30 DIAGNOSIS — Z888 Allergy status to other drugs, medicaments and biological substances status: Secondary | ICD-10-CM | POA: Diagnosis not present

## 2021-05-30 DIAGNOSIS — Z87891 Personal history of nicotine dependence: Secondary | ICD-10-CM | POA: Diagnosis not present

## 2021-05-30 DIAGNOSIS — I428 Other cardiomyopathies: Secondary | ICD-10-CM | POA: Diagnosis not present

## 2021-05-30 DIAGNOSIS — G4733 Obstructive sleep apnea (adult) (pediatric): Secondary | ICD-10-CM | POA: Diagnosis not present

## 2021-05-30 DIAGNOSIS — Z8249 Family history of ischemic heart disease and other diseases of the circulatory system: Secondary | ICD-10-CM | POA: Diagnosis not present

## 2021-05-30 DIAGNOSIS — L03115 Cellulitis of right lower limb: Secondary | ICD-10-CM | POA: Insufficient documentation

## 2021-05-30 DIAGNOSIS — Z8673 Personal history of transient ischemic attack (TIA), and cerebral infarction without residual deficits: Secondary | ICD-10-CM | POA: Insufficient documentation

## 2021-05-30 DIAGNOSIS — L97919 Non-pressure chronic ulcer of unspecified part of right lower leg with unspecified severity: Secondary | ICD-10-CM | POA: Insufficient documentation

## 2021-05-30 DIAGNOSIS — I4819 Other persistent atrial fibrillation: Secondary | ICD-10-CM

## 2021-05-30 DIAGNOSIS — I5022 Chronic systolic (congestive) heart failure: Secondary | ICD-10-CM

## 2021-05-30 DIAGNOSIS — I482 Chronic atrial fibrillation, unspecified: Secondary | ICD-10-CM | POA: Diagnosis not present

## 2021-05-30 LAB — BASIC METABOLIC PANEL
BUN/Creatinine Ratio: 27 (ref 12–28)
BUN: 24 mg/dL (ref 8–27)
CO2: 24 mmol/L (ref 20–29)
Calcium: 10.2 mg/dL (ref 8.7–10.3)
Chloride: 97 mmol/L (ref 96–106)
Creatinine, Ser: 0.88 mg/dL (ref 0.57–1.00)
Glucose: 86 mg/dL (ref 65–99)
Potassium: 4.6 mmol/L (ref 3.5–5.2)
Sodium: 136 mmol/L (ref 134–144)
eGFR: 70 mL/min/{1.73_m2} (ref >59)

## 2021-05-30 LAB — CBC
Hematocrit: 40.5 % (ref 34.0–46.6)
Hemoglobin: 13.8 g/dL (ref 11.1–15.9)
MCH: 31.7 pg (ref 26.6–33.0)
MCHC: 34.1 g/dL (ref 31.5–35.7)
MCV: 93 fL (ref 79–97)
Platelets: 139 10*3/uL — ABNORMAL LOW (ref 150–450)
RBC: 4.35 x10E6/uL (ref 3.77–5.28)
RDW: 12.9 % (ref 11.7–15.4)
WBC: 5.8 10*3/uL (ref 3.4–10.8)

## 2021-05-30 LAB — BRAIN NATRIURETIC PEPTIDE: BNP: 561.9 pg/mL — ABNORMAL HIGH (ref 0.0–100.0)

## 2021-05-30 MED ORDER — CEPHALEXIN 500 MG PO CAPS: 500.0000 mg | ORAL_CAPSULE | Freq: Three times a day (TID) | ORAL | 0 refills | Status: AC

## 2021-05-30 NOTE — Progress Notes (Signed)
Paramedicine Encounter   Patient ID: Misty Gray , female,   DOB: 01-01-1951,69 y.o.,  MRN: [id number]   Met patient in clinic today with provider.  Weight @ clinic-166 B/p-130/78 P-73 Sp02-94 REDS CLIP-28%  Pt seen ortho the other day and they want to do surgery on her arm Friday if possible if cardiology signs off on it today.  She also started taking the doxy yesterday for her cellulitis.  She is being cleared for surgery for Friday but her infection needs to clear up before she gets the pacemaker swapped out.  Adding keflex as well.  Her pacemaker surgery will be resch.  She will also be sch for [place] of her legs to check blood flow.  Dr patel referred her to wound clinic but they havent heard back yet. I sent him message asking which one she was referred to so I can be sure she follows up.  Will see her next Monday.    [name], [place] 05/30/2021

## 2021-05-30 NOTE — Patient Instructions (Signed)
Take Keflex 500 mg Three times a day FOR 7 DAYS ONLY, this is an antibiotic for your wound on your leg  Your physician has requested that you have an ankle brachial index (ABI). During this test an ultrasound and blood pressure cuff are used to evaluate the arteries that supply the arms and legs with blood. Allow thirty minutes for this exam. There are no restrictions or special instructions. THIS WILL BE DONE AT [facility] ON [address] THEY WILL CALL YOU TO SCHEDULE  Please call our office in November to schedule your follow up appointment  If you have any questions or concerns before your next appointment please send [place] a message through [place] or call our office at [phone].    TO LEAVE A MESSAGE FOR THE NURSE SELECT OPTION 2, PLEASE LEAVE A MESSAGE INCLUDING: YOUR NAME DATE OF BIRTH CALL BACK NUMBER REASON FOR CALL**this is important as we prioritize the call backs  YOU WILL RECEIVE A CALL BACK THE SAME DAY AS LONG AS YOU CALL BEFORE 4:00 PM  At the [facility], you and your health needs are our priority. As part of our continuing mission to provide you with exceptional heart care, we have created designated Provider Care Teams. These Care Teams include your primary Cardiologist (physician) and Advanced Practice Providers (APPs- Physician Assistants and Nurse Practitioners) who all work together to provide you with the care you need, when you need it.   You may see any of the following providers on your designated Care Team at your next follow up: Dr Arvilla Meres Dr Marca Ancona Dr Brandon Melnick, NP Robbie Lis, [place] Mikki Santee Karle Plumber, PharmD   Please be sure to bring in all your medications bottles to every appointment.

## 2021-05-30 NOTE — Progress Notes (Signed)
Pt brought surgical clearance form to appt today for completion. She needs clearance for open reduction internal fixation of left arm with axillary block/mac  Per Dr Haroldine Laws: "ok to proceed from a cardiac perspective. Patient with active wound RLE"  Copy faxed to [facility], atten: Rhonda at [phone]  Pt given original copy for her records.

## 2021-05-31 ENCOUNTER — Other Ambulatory Visit: Payer: Self-pay

## 2021-05-31 ENCOUNTER — Other Ambulatory Visit: Payer: Self-pay | Admitting: Orthopedic Surgery

## 2021-05-31 ENCOUNTER — Encounter (HOSPITAL_COMMUNITY): Payer: Self-pay | Admitting: Orthopedic Surgery

## 2021-05-31 NOTE — Progress Notes (Signed)
Anesthesia Chart Review: SAME DAY WORK-UP  Case: [id number] Date/Time: 06/01/21 1230   Procedure: OPEN REDUCTION INTERNAL FIXATION (ORIF) RADIAL FRACTURE (Left) - AXILLIARY BLOCK   Anesthesia type: Monitor Anesthesia Care   Pre-op diagnosis: DISTAL RADIUS FRACTURE   [facility]: [facility] ROOM 11 / [facility]   Surgeons: Dairl Ponder, MD       DISCUSSION: Patient is a 70 year old female scheduled for the above procedure.  History includes former smoker (quit 03/16/69), chronic systolic CHF due to non-ischemic CM (possible PVC induced versus RV pacing), CHF, SSS/frequent PVCs (s/p [employer] PPM; intolerant to b-blockers), afib, murmur (mild-moderate MR, moderate TR 06/2020), HTN, hypercholesterolemia, exertional dyspnea, syncope (2013), anemia, hiatal hernia, OSA (history of 2L nocturnal O2), CVA (1988), seizures, obesity.  Evaluated by Dr. Gala Romney on 05/30/21: Pre-op surgical clearance for wrist surgery - scheduled to be done with local block - ok to proceed from cardiac standpoint  Of note, her PPM is at [facility] and was scheduled for generator change; however due to recent RLE cellulitis, Dr. Gala Romney discussed with Dr. Johney Frame and that procedure will be postponed until infection clears (currently scheduled for 06/21/21). By notes, "< 20% RV bacing and QRS < 130 so felt not to be candidate for CRT upgrade. Patient did not want ICD." LVEF has been 15-35% range since at least 05/2012.   VS:  BP Readings from Last 3 Encounters:  05/30/21 130/78  05/29/21 126/76  05/28/21 100/60   Pulse Readings from Last 3 Encounters:  05/30/21 73  05/29/21 70  05/28/21 68     PROVIDERS: Latrelle Dodrill, MD is PCP  Arvilla Meres, MD is HF Cardiologist [name], MD is EP Cardiologist   LABS: Currently last lab results include: Lab Results  Component Value Date   WBC 8.2 05/23/2021   HGB 13.7 05/23/2021   HCT 40.3 05/23/2021   PLT 184 05/23/2021   GLUCOSE 92 05/23/2021   ALT 22 01/30/2021   AST  42 (H) 01/30/2021   NA 137 05/23/2021   K 4.1 05/23/2021   CL 98 05/23/2021   CREATININE 0.98 05/23/2021   BUN 34 (H) 05/23/2021   CO2 24 05/23/2021    Sleep Study 10/21/17: IMPRESSIONS - Moderate obstructive sleep apnea occurred during the diagnostic portion of the study(AHI = 18.6/hour). An optimal PAP pressure was selected for this patient ( 8 cm of water) - No significant central sleep apnea occurred during the diagnostic portion of the study (CAI = 0.0/hour). - Moderate oxygen desaturation was noted during the diagnostic portion of the study (Min O2 =81.00%). On final CPAP O2 sat - The patient snored with moderate snoring volume during the diagnostic portion of the study. - EKG findings include atrial fibrillation with mean heart rate 52/ min,  PVCs. - Clinically significant periodic limb movements did not occur during sleep.   IMAGES: Wrist xray left 05/17/21: IMPRESSION: Comminuted fracture distal radius with dorsal displacement. Small avulsion fracture of the ulnar styloid.  CXR 05/08/21: FINDINGS: Small left pleural effusion and left lung base atelectasis or infiltrate. The right lung is clear. No pneumothorax. There is cardiomegaly with mild central vascular congestion. Right pectoral pacemaker device. No acute osseous pathology.   IMPRESSION: Small left pleural effusion and left lung base atelectasis or infiltrate.    EKG: 05/23/21: Afib   CV: Echo 07/13/20: IMPRESSIONS   1. Left ventricular ejection fraction, by estimation, is 25 to 30%. The  left ventricle has severely decreased function. The left ventricle  demonstrates global  hypokinesis. The left ventricular internal cavity size  was severely dilated. Left ventricular  diastolic parameters are indeterminate.   2. Right ventricular systolic function is moderately reduced. The right  ventricular size is moderately enlarged. There is severely elevated  pulmonary artery systolic pressure. The estimated right  ventricular  systolic pressure is 60.2 mmHg.   3. Left atrial size was moderately dilated.   4. Right atrial size was severely dilated.   5. The mitral valve is abnormal. Thickened leaflets. Mild to moderate  mitral valve regurgitation.   6. Tricuspid valve regurgitation is moderate.   7. The aortic valve was not well visualized. Aortic valve regurgitation  is not visualized. Mild aortic valve sclerosis is present, with no  evidence of aortic valve stenosis.   8. The inferior vena cava is dilated in size with <50% respiratory  variability, suggesting right atrial pressure of 15 mmHg.    RHC/LHC 10/03/16: Findings:   Ao = 94/58 (72) LV = 104/20 RA = 21 RV = 65/4/19 PA = 65/21 (38) PCW = 23 Fick cardiac output/index = 6.1/2.9 PVR = 2.5 WU FA sat = 97% PA sat = 69%, 70%   Assessment: 1. Normal coronary arteries 2. NICM with EF 30-35% by echo 3. Persistently elevated biventricular pressures   Plan/Discussion:   Continue diuresis. Suspect NICM may be due to frequent PVCs or RV pacing. Will interrogate pacer in am.   Past Medical History:  Diagnosis Date   Abnormal CT of the head 12/16/1984   R parietal atrophy   Allergic rhinitis, cause unspecified    Altered mental status 11/28/2016   Anemia    Arthritis    "hands and knees" (09/06/2015)   Atrial fibrillation (HCC)    Cellulitis 09/07/2015   Cellulitis and abscess of leg 09/2016   bilateral   CHF (congestive heart failure) (HCC)    Diaphragmatic hernia without mention of obstruction or gangrene    Dyspnea    Exertional dyspnea 06/22/12   Family history of adverse reaction to anesthesia    "daughter fights w/them; just can't relax during OR; stay awake during the OR"   Fatigue 06/22/2012   Female stress incontinence    GERD (gastroesophageal reflux disease)    Grand mal seizure (HCC)    H/O hiatal hernia    two removed   Heart murmur    High cholesterol    History of blood transfusion 1956   "related to nose  bleed"   Hypertension    Influenza A 01/11/2014   Lichenification and lichen simplex chronicus    Migraine    "when I was a teenager"   Morbid obesity (HCC)    Nonischemic cardiomyopathy (HCC)    EF has normalized-repeat Pending    On home oxygen therapy    "2L when I'm asleep in bed" (09/06/2015)   OSA on CPAP    Pacemaker  st judes    Patient states "this is my third pacemaker"   Pneumonia 2004; 2011; 11/2011   Seizures (HCC) since 1981   "can hear you talking but sounds like you are in big tunnel; have them often if not taking RX; last one was 06/17/12" (09/06/2015)   Shortness of breath 10/24/2010   Qualifier: Diagnosis of  By: [place], Bonnie     Sick sinus syndrome (HCC)    WITH PRIOR DDD PACEMAKER IMPLANTATION   Somnolence    Stroke (HCC) 1988   "mouth drawed real bad on my left side; found out it was a seizure"  Syncope and collapse 06/22/12   "hit forehead and left knee"; denies loss of consciousness   Unspecified venous (peripheral) insufficiency     Past Surgical History:  Procedure Laterality Date   APPENDECTOMY     CARDIAC CATHETERIZATION N/A 10/03/2016   Procedure: Right/Left Heart Cath and Coronary Angiography;  Surgeon: Dolores Patty, MD;  Location: [facility] INVASIVE CV LAB;  Service: Cardiovascular;  Laterality: N/A;   HERNIA REPAIR  ? 1988; 04/15/1998   INSERT / REPLACE / REMOVE PACEMAKER  12/16/1990   DDD EF 30%;   INSERT / REPLACE / REMOVE PACEMAKER  07/25/2003   replaced by BB, leads are both IS1 and from 1992   INSERT / REPLACE / REMOVE PACEMAKER  05/21/2011   JA; generator change   LEG SKIN LESION  BIOPSY / EXCISION  05/16/2001   Lichen Planus   VENTRAL HERNIA REPAIR  1989; 04/15/1998    MEDICATIONS: No current facility-administered medications for this encounter.    amiodarone (PACERONE) 200 MG tablet   cephALEXin (KEFLEX) 500 MG capsule   cholecalciferol (VITAMIN D) 25 MCG (1000 UNIT) tablet   doxycycline (VIBRAMYCIN) 100 MG capsule   edoxaban (SAVAYSA) 60 MG  TABS tablet   fluticasone (FLONASE) 50 MCG/ACT nasal spray   losartan (COZAAR) 25 MG tablet   omeprazole (PRILOSEC) 20 MG capsule   PHENobarbital (LUMINAL) 64.8 MG tablet   potassium chloride (KLOR-CON) 10 MEQ tablet   spironolactone (ALDACTONE) 25 MG tablet   torsemide (DEMADEX) 20 MG tablet   oxyCODONE-acetaminophen (PERCOCET/ROXICET) 5-325 MG tablet    Shonna Chock, PA-C Surgical Short Stay/Anesthesiology [facility] Phone [phone] [facility] Phone [phone] 05/31/2021 5:48 PM

## 2021-05-31 NOTE — Progress Notes (Signed)
Pt has pacemaker, device orders requested, [employer] rep [place] notified

## 2021-05-31 NOTE — Progress Notes (Signed)
PCP - Dr. Pollie Meyer  Cardiologist - Dr. Johney Frame and Dr. Jones Broom EKG - 05/23/21 Chest x-ray -  ECHO - 07/13/20 Cardiac Cath -  CPAP - yes home O2   Blood Thinner Instructions: edoxaban (SAVAYSA) Aspirin Instructions:   ERAS Protcol - clears 0945  COVID TEST- n/a  Anesthesia review: yes pacemaker, cardiac hx  -------------  SDW INSTRUCTIONS:  Your procedure is scheduled on 6/17 Friday . Please report to [facility] Main Entrance "A" at 10:15 A.M., and check in at the Admitting office. Call this number if you have problems the morning of surgery: [phone]   Remember: Do not eat after midnight the night before your surgery  You may drink clear liquids until 0945 the morning of your surgery.   Clear liquids allowed are: Water, Non-Citrus Juices (without pulp), Carbonated Beverages, Clear Tea, Black Coffee Only, and Gatorade   Medications to take morning of surgery with a sip of water include: amiodarone (PACERONE)  cephALEXin (KEFLEX) doxycycline (VIBRAMYCIN) fluticasone (FLONASE)  omeprazole (PRILOSEC) PHENobarbital (LUMINAL)   Per pt - she does not know what she takes Medications in possible bubble pack? Pt instructed to just not take any medications if she does not know what she takes.    As of today, STOP taking any Aspirin (unless otherwise instructed by your surgeon), Aleve, Naproxen, Ibuprofen, Motrin, Advil, Goody's, BC's, all herbal medications, fish oil, and all vitamins.    The Morning of Surgery Do not wear jewelry, make-up or nail polish. Do not wear lotions, powders, or perfumes, or deodorant Do not shave 48 hours prior to surgery.   Do not bring valuables to the hospital. [facility] is not responsible for any belongings or valuables.  If you are a smoker, DO NOT Smoke 24 hours prior to surgery If you wear a CPAP at night please bring your mask the morning of surgery  Remember that you must have someone to transport you home after your surgery, and  remain with you for 24 hours if you are discharged the same day.  Please bring cases for contacts, glasses, hearing aids, dentures or bridgework because it cannot be worn into surgery.   Patients discharged the day of surgery will not be allowed to drive home.   Please shower the NIGHT BEFORE/MORNING OF SURGERY (use antibacterial soap like DIAL soap if possible). Wear comfortable clothes the morning of surgery. Oral Hygiene is also important to reduce your risk of infection.  Remember - BRUSH YOUR TEETH THE MORNING OF SURGERY WITH YOUR REGULAR TOOTHPASTE  Patient denies shortness of breath, fever, cough and chest pain.

## 2021-05-31 NOTE — Anesthesia Preprocedure Evaluation (Addendum)
Anesthesia Evaluation  Patient identified by MRN, date of birth, ID band Patient awake    Reviewed: Allergy & Precautions, NPO status , Patient's Chart, lab work & pertinent test results  History of Anesthesia Complications (+) Family history of anesthesia reaction  Airway Mallampati: II  TM Distance: >3 FB Neck ROM: Full    Dental  (+) Edentulous Lower, Missing, Dental Advisory Given   Pulmonary shortness of breath and with exertion, sleep apnea and Continuous Positive Airway Pressure Ventilation , pneumonia, resolved, former smoker,    Pulmonary exam normal breath sounds clear to auscultation       Cardiovascular hypertension, Pt. on medications +CHF  + dysrhythmias Atrial Fibrillation + pacemaker + Valvular Problems/Murmurs  Rhythm:Irregular Rate:Normal     Neuro/Psych  Headaches, Seizures -, Well Controlled,   Neuromuscular disease No Residual Symptoms negative psych ROS   GI/Hepatic Neg liver ROS, hiatal hernia, GERD  Medicated and Controlled,  Endo/Other  Hyperlipidemia Obesity  Renal/GU negative Renal ROS  negative genitourinary   Musculoskeletal  (+) Arthritis , Osteoarthritis,  Fx left distal radius   Abdominal (+) + obese,   Peds  Hematology  (+) anemia , Chronic anticoagulation- Edoxaban last dose yesterday   Anesthesia Other Findings   Reproductive/Obstetrics                           Anesthesia Physical Anesthesia Plan  ASA: 3  Anesthesia Plan: Regional and MAC   Post-op Pain Management:    Induction:   PONV Risk Score and Plan: 2 and Treatment may vary due to age or medical condition, Propofol infusion and Ondansetron  Airway Management Planned: Natural Airway and Simple Face Mask  Additional Equipment:   Intra-op Plan:   Post-operative Plan:   Informed Consent: I have reviewed the patients History and Physical, chart, labs and discussed the procedure including  the risks, benefits and alternatives for the proposed anesthesia with the patient or authorized representative who has indicated his/her understanding and acceptance.     Dental advisory given  Plan Discussed with: CRNA and Anesthesiologist  Anesthesia Plan Comments: (PAT note written 05/31/2021 by Myra Gianotti, PA-C. )       Anesthesia Quick Evaluation

## 2021-06-01 ENCOUNTER — Encounter (HOSPITAL_COMMUNITY): Payer: Self-pay | Admitting: Orthopedic Surgery

## 2021-06-01 ENCOUNTER — Encounter: Payer: Self-pay | Admitting: Emergency Medicine

## 2021-06-01 ENCOUNTER — Ambulatory Visit (HOSPITAL_COMMUNITY)
Admission: RE | Admit: 2021-06-01 | Discharge: 2021-06-01 | Disposition: A | Discharge status: Home / Self Care | Payer: Medicare Other | Attending: Orthopedic Surgery | Admitting: Orthopedic Surgery

## 2021-06-01 ENCOUNTER — Encounter (HOSPITAL_COMMUNITY)
Admission: RE | Disposition: A | Discharge status: Home / Self Care | Payer: Self-pay | Source: Home / Self Care | Attending: Orthopedic Surgery

## 2021-06-01 ENCOUNTER — Ambulatory Visit (HOSPITAL_COMMUNITY): Payer: Medicare Other | Admitting: Vascular Surgery

## 2021-06-01 ENCOUNTER — Telehealth: Payer: Self-pay | Admitting: Emergency Medicine

## 2021-06-01 ENCOUNTER — Other Ambulatory Visit: Payer: Self-pay

## 2021-06-01 DIAGNOSIS — Z833 Family history of diabetes mellitus: Secondary | ICD-10-CM | POA: Diagnosis not present

## 2021-06-01 DIAGNOSIS — Z8261 Family history of arthritis: Secondary | ICD-10-CM | POA: Insufficient documentation

## 2021-06-01 DIAGNOSIS — S52502A Unspecified fracture of the lower end of left radius, initial encounter for closed fracture: Secondary | ICD-10-CM | POA: Diagnosis not present

## 2021-06-01 DIAGNOSIS — E559 Vitamin D deficiency, unspecified: Secondary | ICD-10-CM | POA: Diagnosis not present

## 2021-06-01 DIAGNOSIS — I110 Hypertensive heart disease with heart failure: Secondary | ICD-10-CM | POA: Insufficient documentation

## 2021-06-01 DIAGNOSIS — Z886 Allergy status to analgesic agent status: Secondary | ICD-10-CM | POA: Diagnosis not present

## 2021-06-01 DIAGNOSIS — I509 Heart failure, unspecified: Secondary | ICD-10-CM | POA: Diagnosis not present

## 2021-06-01 DIAGNOSIS — W19XXXA Unspecified fall, initial encounter: Secondary | ICD-10-CM | POA: Diagnosis not present

## 2021-06-01 DIAGNOSIS — S52592A Other fractures of lower end of left radius, initial encounter for closed fracture: Secondary | ICD-10-CM | POA: Diagnosis present

## 2021-06-01 DIAGNOSIS — I482 Chronic atrial fibrillation, unspecified: Secondary | ICD-10-CM | POA: Diagnosis not present

## 2021-06-01 DIAGNOSIS — Z79899 Other long term (current) drug therapy: Secondary | ICD-10-CM | POA: Insufficient documentation

## 2021-06-01 DIAGNOSIS — S52615A Nondisplaced fracture of left ulna styloid process, initial encounter for closed fracture: Secondary | ICD-10-CM | POA: Insufficient documentation

## 2021-06-01 DIAGNOSIS — Z91048 Other nonmedicinal substance allergy status: Secondary | ICD-10-CM | POA: Diagnosis not present

## 2021-06-01 DIAGNOSIS — Z880 Allergy status to penicillin: Secondary | ICD-10-CM | POA: Insufficient documentation

## 2021-06-01 DIAGNOSIS — Z888 Allergy status to other drugs, medicaments and biological substances status: Secondary | ICD-10-CM | POA: Diagnosis not present

## 2021-06-01 DIAGNOSIS — Z7901 Long term (current) use of anticoagulants: Secondary | ICD-10-CM | POA: Diagnosis not present

## 2021-06-01 DIAGNOSIS — Z881 Allergy status to other antibiotic agents status: Secondary | ICD-10-CM | POA: Diagnosis not present

## 2021-06-01 DIAGNOSIS — Z87891 Personal history of nicotine dependence: Secondary | ICD-10-CM | POA: Diagnosis not present

## 2021-06-01 DIAGNOSIS — E876 Hypokalemia: Secondary | ICD-10-CM | POA: Diagnosis not present

## 2021-06-01 DIAGNOSIS — I4891 Unspecified atrial fibrillation: Secondary | ICD-10-CM | POA: Diagnosis not present

## 2021-06-01 DIAGNOSIS — S52572A Other intraarticular fracture of lower end of left radius, initial encounter for closed fracture: Secondary | ICD-10-CM | POA: Diagnosis not present

## 2021-06-01 HISTORY — PX: ORIF RADIAL FRACTURE: SHX5113

## 2021-06-01 LAB — SURGICAL PCR SCREEN
MRSA, PCR: NEGATIVE
Staphylococcus aureus: NEGATIVE

## 2021-06-01 SURGERY — OPEN REDUCTION INTERNAL FIXATION (ORIF) RADIAL FRACTURE
Anesthesia: Monitor Anesthesia Care | Site: Wrist | Laterality: Left

## 2021-06-01 MED ORDER — MIDAZOLAM HCL 2 MG/2ML IJ SOLN
INTRAMUSCULAR | Status: AC
  Administered 2021-06-01: 1 mg via INTRAVENOUS
  Filled 2021-06-01: qty 2

## 2021-06-01 MED ORDER — LACTATED RINGERS IV SOLN: INTRAVENOUS | Status: DC

## 2021-06-01 MED ORDER — PROPOFOL 10 MG/ML IV BOLUS
INTRAVENOUS | Status: DC | PRN
  Administered 2021-06-01: 100 mg via INTRAVENOUS

## 2021-06-01 MED ORDER — CHLORHEXIDINE GLUCONATE 0.12 % MT SOLN
15.0000 mL | Freq: Once | OROMUCOSAL | Status: AC
  Administered 2021-06-01: 15 mL via OROMUCOSAL
  Filled 2021-06-01: qty 15

## 2021-06-01 MED ORDER — PROPOFOL 500 MG/50ML IV EMUL
INTRAVENOUS | Status: DC | PRN
  Administered 2021-06-01: 100 ug/kg/min via INTRAVENOUS

## 2021-06-01 MED ORDER — FENTANYL CITRATE (PF) 100 MCG/2ML IJ SOLN: 25.0000 ug | INTRAMUSCULAR | Status: DC | PRN

## 2021-06-01 MED ORDER — PHENYLEPHRINE 40 MCG/ML (10ML) SYRINGE FOR IV PUSH (FOR BLOOD PRESSURE SUPPORT)
PREFILLED_SYRINGE | INTRAVENOUS | Status: DC | PRN
  Administered 2021-06-01 (×4): 80 ug via INTRAVENOUS

## 2021-06-01 MED ORDER — FENTANYL CITRATE (PF) 100 MCG/2ML IJ SOLN
INTRAMUSCULAR | Status: AC
  Administered 2021-06-01: 50 ug via INTRAVENOUS
  Filled 2021-06-01: qty 2

## 2021-06-01 MED ORDER — BUPIVACAINE HCL (PF) 0.25 % IJ SOLN
INTRAMUSCULAR | Status: AC
  Filled 2021-06-01: qty 30

## 2021-06-01 MED ORDER — FENTANYL CITRATE (PF) 250 MCG/5ML IJ SOLN
INTRAMUSCULAR | Status: AC
  Filled 2021-06-01: qty 5

## 2021-06-01 MED ORDER — OXYCODONE HCL 5 MG PO TABS
5.0000 mg | ORAL_TABLET | Freq: Once | ORAL | Status: DC | PRN
Start: 2021-06-01 — End: 2021-06-01

## 2021-06-01 MED ORDER — ONDANSETRON HCL 4 MG/2ML IJ SOLN
4.0000 mg | Freq: Once | INTRAMUSCULAR | Status: DC | PRN
Start: 2021-06-01 — End: 2021-06-01

## 2021-06-01 MED ORDER — FENTANYL CITRATE (PF) 100 MCG/2ML IJ SOLN: 50.0000 ug | Freq: Once | INTRAMUSCULAR | Status: AC

## 2021-06-01 MED ORDER — BUPIVACAINE-EPINEPHRINE (PF) 0.5% -1:200000 IJ SOLN
INTRAMUSCULAR | Status: DC | PRN
  Administered 2021-06-01: 30 mL via PERINEURAL

## 2021-06-01 MED ORDER — MIDAZOLAM HCL 2 MG/2ML IJ SOLN: 1.0000 mg | Freq: Once | INTRAMUSCULAR | Status: AC

## 2021-06-01 MED ORDER — LIDOCAINE HCL (CARDIAC) PF 100 MG/5ML IV SOSY
PREFILLED_SYRINGE | INTRAVENOUS | Status: DC | PRN
  Administered 2021-06-01: 60 mg via INTRAVENOUS

## 2021-06-01 MED ORDER — 0.9 % SODIUM CHLORIDE (POUR BTL) OPTIME
TOPICAL | Status: DC | PRN
  Administered 2021-06-01: 1000 mL

## 2021-06-01 MED ORDER — ORAL CARE MOUTH RINSE: 15.0000 mL | Freq: Once | OROMUCOSAL | Status: AC

## 2021-06-01 MED ORDER — OXYCODONE-ACETAMINOPHEN 5-325 MG PO TABS: 1.0000 | ORAL_TABLET | ORAL | 0 refills | Status: DC | PRN

## 2021-06-01 MED ORDER — OXYCODONE HCL 5 MG/5ML PO SOLN: 5.0000 mg | Freq: Once | ORAL | Status: DC | PRN

## 2021-06-01 MED ORDER — FENTANYL CITRATE (PF) 100 MCG/2ML IJ SOLN
INTRAMUSCULAR | Status: DC | PRN
  Administered 2021-06-01: 50 ug via INTRAVENOUS

## 2021-06-01 MED ORDER — CLINDAMYCIN PHOSPHATE 900 MG/50ML IV SOLN
900.0000 mg | INTRAVENOUS | Status: AC
  Administered 2021-06-01: 900 mg via INTRAVENOUS
  Filled 2021-06-01: qty 50

## 2021-06-01 MED ORDER — BUPIVACAINE-EPINEPHRINE (PF) 0.5% -1:200000 IJ SOLN: INTRAMUSCULAR | Status: DC | PRN

## 2021-06-01 SURGICAL SUPPLY — 65 items
BIT DRILL 2 FAST STEP (BIT) ×1 IMPLANT
BIT DRILL 2.5X4 QC (BIT) ×1 IMPLANT
BNDG CMPR 9X4 STRL LF SNTH (GAUZE/BANDAGES/DRESSINGS) ×1
BNDG ELASTIC 3X5.8 VLCR STR LF (GAUZE/BANDAGES/DRESSINGS) ×2 IMPLANT
BNDG ELASTIC 4X5.8 VLCR STR LF (GAUZE/BANDAGES/DRESSINGS) ×2 IMPLANT
BNDG ESMARK 4X9 LF (GAUZE/BANDAGES/DRESSINGS) ×2 IMPLANT
BNDG GAUZE ELAST 4 BULKY (GAUZE/BANDAGES/DRESSINGS) ×3 IMPLANT
CLSR STERI-STRIP ANTIMIC 1/2X4 (GAUZE/BANDAGES/DRESSINGS) ×1 IMPLANT
CORD BIPOLAR FORCEPS 12FT (ELECTRODE) IMPLANT
COVER SURGICAL LIGHT HANDLE (MISCELLANEOUS) ×2 IMPLANT
COVER WAND RF STERILE (DRAPES) ×2 IMPLANT
CUFF TOURN SGL QUICK 18X4 (TOURNIQUET CUFF) ×2 IMPLANT
DRAPE OEC MINIVIEW 54X84 (DRAPES) IMPLANT
DRAPE SURG 17X23 STRL (DRAPES) ×2 IMPLANT
DURAPREP 26ML APPLICATOR (WOUND CARE) ×2 IMPLANT
ELECT REM PT RETURN 9FT ADLT (ELECTROSURGICAL)
ELECTRODE REM PT RTRN 9FT ADLT (ELECTROSURGICAL) IMPLANT
GAUZE SPONGE 4X4 12PLY STRL (GAUZE/BANDAGES/DRESSINGS) ×2 IMPLANT
GAUZE SPONGE 4X4 12PLY STRL LF (GAUZE/BANDAGES/DRESSINGS) ×1 IMPLANT
GAUZE XEROFORM 1X8 LF (GAUZE/BANDAGES/DRESSINGS) ×2 IMPLANT
GLOVE SURG SYN 8.0 (GLOVE) ×2 IMPLANT
GLOVE SURG SYN 8.0 PF PI (GLOVE) ×1 IMPLANT
GOWN STRL REUS W/ TWL LRG LVL3 (GOWN DISPOSABLE) ×1 IMPLANT
GOWN STRL REUS W/ TWL XL LVL3 (GOWN DISPOSABLE) ×1 IMPLANT
GOWN STRL REUS W/TWL LRG LVL3 (GOWN DISPOSABLE) ×2
GOWN STRL REUS W/TWL XL LVL3 (GOWN DISPOSABLE) ×2
KIT BASIN OR (CUSTOM PROCEDURE TRAY) ×2 IMPLANT
KIT TURNOVER KIT B (KITS) ×2 IMPLANT
MANIFOLD NEPTUNE II (INSTRUMENTS) ×2 IMPLANT
NDL HYPO 25GX1X1/2 BEV (NEEDLE) IMPLANT
NEEDLE HYPO 25GX1X1/2 BEV (NEEDLE) IMPLANT
NS IRRIG 1000ML POUR BTL (IV SOLUTION) ×2 IMPLANT
PACK ORTHO EXTREMITY (CUSTOM PROCEDURE TRAY) ×2 IMPLANT
PAD ARMBOARD 7.5X6 YLW CONV (MISCELLANEOUS) ×4 IMPLANT
PAD CAST 3X4 CTTN HI CHSV (CAST SUPPLIES) ×1 IMPLANT
PAD CAST 4YDX4 CTTN HI CHSV (CAST SUPPLIES) ×1 IMPLANT
PADDING CAST ABS 4INX4YD NS (CAST SUPPLIES) ×1
PADDING CAST ABS COTTON 4X4 ST (CAST SUPPLIES) IMPLANT
PADDING CAST COTTON 3X4 STRL (CAST SUPPLIES) ×2
PADDING CAST COTTON 4X4 STRL (CAST SUPPLIES) ×2
PEG THREADED 2.5MMX18MM LONG (Peg) ×2 IMPLANT
PEG THREADED 2.5MMX20MM LONG (Peg) ×3 IMPLANT
PEG THREADED 2.5MMX22MM LONG (Peg) ×3 IMPLANT
PENCIL BUTTON HOLSTER BLD 10FT (ELECTRODE) IMPLANT
PLATE STAN 24.4X59.5 LT (Plate) ×1 IMPLANT
SCREW BN 12X3.5XNS CORT TI (Screw) IMPLANT
SCREW CORT 3.5X10 LNG (Screw) ×2 IMPLANT
SCREW CORT 3.5X12 (Screw) ×4 IMPLANT
SLING ARM FOAM STRAP MED (SOFTGOODS) ×1 IMPLANT
SPLINT PLASTER CAST XFAST 4X15 (CAST SUPPLIES) ×10 IMPLANT
SPLINT PLASTER XTRA FAST SET 4 (CAST SUPPLIES) ×10
SPONGE LAP 4X18 RFD (DISPOSABLE) IMPLANT
STRIP CLOSURE SKIN 1/2X4 (GAUZE/BANDAGES/DRESSINGS) IMPLANT
SUT PROLENE 3 0 PS 1 (SUTURE) ×1 IMPLANT
SUT PROLENE 3 0 PS 2 (SUTURE) IMPLANT
SUT VIC AB 2-0 SH 27 (SUTURE) ×2
SUT VIC AB 2-0 SH 27XBRD (SUTURE) IMPLANT
SUT VIC AB 4-0 PS2 18 (SUTURE) ×2 IMPLANT
SUT VICRYL 4-0 PS2 18IN ABS (SUTURE) IMPLANT
SYR CONTROL 10ML LL (SYRINGE) IMPLANT
TOWEL GREEN STERILE (TOWEL DISPOSABLE) ×2 IMPLANT
TOWEL GREEN STERILE FF (TOWEL DISPOSABLE) ×2 IMPLANT
TUBE CONNECTING 12X1/4 (SUCTIONS) IMPLANT
UNDERPAD 30X36 HEAVY ABSORB (UNDERPADS AND DIAPERS) ×2 IMPLANT
WATER STERILE IRR 1000ML POUR (IV SOLUTION) ×2 IMPLANT

## 2021-06-01 NOTE — Op Note (Signed)
Patient was taken the operating suite and after induction of adequate regional anesthetic and a general laryngeal mask airway anesthetic the left upper extremity was prepped and draped in the usual sterile fashion.  An Esmarch was used to exsanguinate the limb and the tourniquet was inflated 2 and 50 mmHg.  This point time incision made in the distal forearm and wrist area palmarly over the FCR tendon.  The skin was incised 6 to 8 cm and the sheath overlying the flexor carpi radialis tendon was incised.  The FCR tendon was retracted to the midline the radial artery to the lateral side.  The fascia was incised and dissection was carried down to the level of the pronator quadratus.  The pronator quadratus was subperiosteally stripped off the volar distal radius revealing a complex intra-articular fracture of the distal radius on the left side with more than 3 fragments.  We carefully released the brachioradialis sharply off the radial styloid fragment to help in reduction.  Reduction was performed with longitudinal traction, flexion, and ulnar deviation.  We then placed a standard left DVR plate palmarly and under direct and fluoroscopic imaging determine adequate plate position we fixed it proximally 3 cortical screws followed by 7 partially threaded pegs distally.  Fluoroscopic imaging revealed adequate reduction of the fracture and good placement of hardware in all 3 views.  We then thoroughly irrigated and loosely closed the wound in layers of 2-0 undyed Vicryl to cover the plate, 4-0 Vicryl subcutaneously, and a 3-0 Prolene subcuticular stitch on the skin.  Steri-Strips, 4 x 4's, and a compressive hand dressing and palmar splint was applied.  Patient tolerated this procedure well went to come in stable fashion.

## 2021-06-01 NOTE — Anesthesia Procedure Notes (Addendum)
  Anesthesia Regional Block: Supraclavicular block   Pre-Anesthetic Checklist: , timeout performed,  Correct Patient, Correct Site, Correct Laterality,  Correct Procedure, Correct Position, site marked,  Risks and benefits discussed,  Surgical consent,  Pre-op evaluation,  At surgeon's request  Laterality: Left  Prep: chloraprep       Needles:  Injection technique: Single-shot  Needle Type: Echogenic Stimulator Needle     Needle Length: 10cm  Needle Gauge: 21   Needle insertion depth: 6 cm   Additional Needles:   Procedures:,,,, ultrasound used (permanent image in chart),,    Narrative:  Start time: 06/01/2021 11:41 AM End time: 06/01/2021 11:46 AM Injection made incrementally with aspirations every 5 mL.  Performed by: Personally  Anesthesiologist: Mal Amabile, MD  Additional Notes: Patient identified. Risks and benefits discussed including failed block, incomplete  Pain control, post dural puncture headache, nerve damage, paralysis, blood pressure Changes, nausea, vomiting, reactions to medications-both toxic and allergic and post Partum back pain. All questions were answered. Patient expressed understanding and wished to proceed. Sterile technique was used throughout procedure. Epidural site was Dressed with sterile barrier dressing. No paresthesias, signs of intravascular injection Or signs of intrathecal spread were encountered.  Patient was more comfortable after the epidural was dosed. Please see RN's note for documentation of vital signs and FHR which are stable.

## 2021-06-01 NOTE — Progress Notes (Unsigned)
[facility] DEVICE PROGRAMMING  Patient Information: Name:  Misty Gray  DOB:  1951/08/04  MRN:  [id number]   Kayleen Memos, RN  P Cv Div Heartcare Device Cc:Clayborn Bigness, RN Planned Procedure:  Open reductio internal fixation left distal radius fracture  Surgeon:  Dr. Charlotte Crumb  Date of Procedure:  06/01/21  Cautery will be used.  Position during surgery:     Please send documentation back to:  [facility] (Fax # [phone])   Kayleen Memos, RN  05/31/2021 5:15 PM  *** Device Information:  Clinic EP Physician:  [name], MD   Device Type:  Pacemaker Manufacturer and Phone #:  [employer] Jude/[employer]: [phone] Pacemaker Dependent?:  No. Date of Last Device Check:  05/23/21 Normal Device Function?:  Yes.   Device met ERI 04/07/21 Electrophysiologist's Recommendations:  Have magnet available. Provide continuous ECG monitoring when magnet is used or reprogramming is to be performed.  Procedure may interfere with device function.  Magnet should be placed over device during procedure.  Per Device Clinic [address] Shirlee More, RN  10:33 AM 06/01/2021

## 2021-06-01 NOTE — Progress Notes (Signed)
Dr. Mina Marble at bedside requesting j-splint and ace wrap be removed at time. Order received and carried out.

## 2021-06-01 NOTE — Progress Notes (Addendum)
[employer] [phone] at 1030. Operator sent page to rep Arlys John at [phone].  Called [phone] at 1105: number is disconnected/ does not work   Rep [place] arrived at 1110.  Pacemaker is on patient's RIGHT side

## 2021-06-01 NOTE — Brief Op Note (Signed)
06/01/2021  2:04 PM  PATIENT:  [name]  70 y.o. female  PRE-OPERATIVE DIAGNOSIS:  DISTAL RADIUS FRACTURE  POST-OPERATIVE DIAGNOSIS:  DISTAL RADIUS FRACTURE  PROCEDURE:  Procedure(s) with comments: OPEN REDUCTION INTERNAL FIXATION (ORIF) RADIAL FRACTURE (Left) - AXILLIARY BLOCK  SURGEON:  Surgeon(s) and Role:    Dairl Ponder, MD - Primary  PHYSICIAN ASSISTANT:   ASSISTANTS: Annye Rusk PA-C ANESTHESIA:   regional and general  EBL: Minimal  BLOOD ADMINISTERED:none  DRAINS: none   LOCAL MEDICATIONS USED:  NONE  SPECIMEN:  No Specimen  DISPOSITION OF SPECIMEN:  N/A  COUNTS:   YES TOURNIQUET:  * Missing tourniquet times found for documented tourniquets in log: [id number] *  DICTATION: .Reubin Milan Dictation  PLAN OF CARE: Discharge to home after PACU  PATIENT DISPOSITION:  PACU - hemodynamically stable.   Delay start of Pharmacological VTE agent (>24hrs) due to surgical blood loss or risk of bleeding: not applicable

## 2021-06-01 NOTE — Anesthesia Procedure Notes (Signed)
Procedure Name: LMA Insertion Date/Time: 06/01/2021 1:22 PM Performed by: Montez Morita, Ferry Matthis W, CRNA Pre-anesthesia Checklist: Patient identified, Emergency Drugs available, Suction available and Patient being monitored Patient Re-evaluated:Patient Re-evaluated prior to induction Oxygen Delivery Method: Circle system utilized Preoxygenation: Pre-oxygenation with 100% oxygen Induction Type: IV induction Ventilation: Mask ventilation without difficulty LMA: LMA inserted LMA Size: 4.0 Number of attempts: 1 Placement Confirmation: positive ETCO2 and breath sounds checked- equal and bilateral Tube secured with: Tape Dental Injury: Teeth and Oropharynx as per pre-operative assessment

## 2021-06-01 NOTE — Anesthesia Postprocedure Evaluation (Signed)
Anesthesia Post Note  Patient: LYNDIA BURY  Procedure(s) Performed: OPEN REDUCTION INTERNAL FIXATION (ORIF) RADIAL FRACTURE (Left: Wrist)     Patient location during evaluation: PACU Anesthesia Type: Regional and General Level of consciousness: awake and alert and oriented Pain management: pain level controlled Vital Signs Assessment: post-procedure vital signs reviewed and stable Respiratory status: spontaneous breathing, nonlabored ventilation and respiratory function stable Cardiovascular status: blood pressure returned to baseline and stable Postop Assessment: no apparent nausea or vomiting Anesthetic complications: no   No notable events documented.  Last Vitals:  Vitals:   06/01/21 1442 06/01/21 1457  BP: 91/77 108/72  Pulse: 62 64  Resp: 15 18  Temp:    SpO2: 97% 96%    Last Pain:  Vitals:   06/01/21 1427  TempSrc:   PainSc: 0-No pain                 Gabbie Marzo A.

## 2021-06-01 NOTE — Telephone Encounter (Signed)
Peri-op form faxed to [facility] via fax machine.

## 2021-06-01 NOTE — Transfer of Care (Signed)
Immediate Anesthesia Transfer of Care Note  Patient: Misty Gray  Procedure(s) Performed: OPEN REDUCTION INTERNAL FIXATION (ORIF) RADIAL FRACTURE (Left: Wrist)  Patient Location: PACU  Anesthesia Type:General and Regional  Level of Consciousness: awake and alert   Airway & Oxygen Therapy: Patient Spontanous Breathing and Patient connected to face mask oxygen  Post-op Assessment: Report given to RN and Post -op Vital signs reviewed and stable  Post vital signs: Reviewed and stable  Last Vitals:  Vitals Value Taken Time  BP 104/79 06/01/21 1427  Temp    Pulse 64 06/01/21 1429  Resp 19 06/01/21 1429  SpO2 97 % 06/01/21 1429  Vitals shown include unvalidated device data.  Last Pain:  Vitals:   06/01/21 1111  TempSrc:   PainSc: 0-No pain         Complications: No notable events documented.

## 2021-06-01 NOTE — H&P (Signed)
Misty Gray is an 70 y.o. female.   Chief Complaint: Left wrist pain and swelling status post fall HPI patient is a very pleasant 70 year old female status post fall onto an outstretched left upper extremity displaced intra-articular fracture distal radius on her left side.  Past Medical History:  Diagnosis Date   Abnormal CT of the head 12/16/1984   R parietal atrophy   Allergic rhinitis, cause unspecified    Altered mental status 11/28/2016   Anemia    Arthritis    "hands and knees" (09/06/2015)   Atrial fibrillation (HCC)    Cellulitis 09/07/2015   Cellulitis and abscess of leg 09/2016   bilateral   CHF (congestive heart failure) (HCC)    Diaphragmatic hernia without mention of obstruction or gangrene    Dyspnea    Exertional dyspnea 06/22/12   Family history of adverse reaction to anesthesia    "daughter fights w/them; just can't relax during OR; stay awake during the OR"   Fatigue 06/22/2012   Female stress incontinence    GERD (gastroesophageal reflux disease)    Grand mal seizure (HCC)    H/O hiatal hernia    two removed   Heart murmur    High cholesterol    History of blood transfusion 1956   "related to nose bleed"   Hypertension    Influenza A 01/11/2014   Lichenification and lichen simplex chronicus    Migraine    "when I was a teenager"   Morbid obesity (HCC)    Nonischemic cardiomyopathy (HCC)    EF has normalized-repeat Pending    On home oxygen therapy    "2L when I'm asleep in bed" (09/06/2015)   OSA on CPAP    Pacemaker  st judes    Patient states "this is my third pacemaker"   Pneumonia 2004; 2011; 11/2011   Seizures (HCC) since 1981   "can hear you talking but sounds like you are in big tunnel; have them often if not taking RX; last one was 06/17/12" (09/06/2015)   Shortness of breath 10/24/2010   Qualifier: Diagnosis of  By: [place], Bonnie     Sick sinus syndrome (HCC)    WITH PRIOR DDD PACEMAKER IMPLANTATION   Somnolence    Stroke (HCC) 1988   "mouth  drawed real bad on my left side; found out it was a seizure"   Syncope and collapse 06/22/12   "hit forehead and left knee"; denies loss of consciousness   Unspecified venous (peripheral) insufficiency     Past Surgical History:  Procedure Laterality Date   APPENDECTOMY     CARDIAC CATHETERIZATION N/A 10/03/2016   Procedure: Right/Left Heart Cath and Coronary Angiography;  Surgeon: Dolores Patty, MD;  Location: [facility] INVASIVE CV LAB;  Service: Cardiovascular;  Laterality: N/A;   HERNIA REPAIR  ? 1988; 04/15/1998   INSERT / REPLACE / REMOVE PACEMAKER  12/16/1990   DDD EF 30%;   INSERT / REPLACE / REMOVE PACEMAKER  07/25/2003   replaced by BB, leads are both IS1 and from 1992   INSERT / REPLACE / REMOVE PACEMAKER  05/21/2011   JA; generator change   LEG SKIN LESION  BIOPSY / EXCISION  05/16/2001   Lichen Planus   VENTRAL HERNIA REPAIR  1989; 04/15/1998    Family History  Problem Relation Age of Onset   Stroke Father        died from CAD?   Heart disease Father    Cancer Mother    Diabetes Son  Type 1   Sleep apnea Son    Cancer Sister        cervical   Hypertension Sister    Hypertension Brother    Rheum arthritis Daughter        Also PGM, PGGM   Social History:  reports that she quit smoking about 52 years ago. Her smoking use included cigarettes. She has a 7.00 pack-year smoking history. She has never used smokeless tobacco. She reports that she does not drink alcohol and does not use drugs.  Allergies:  Allergies  Allergen Reactions   Aspirin Hives and Rash   Penicillins Rash and Other (See Comments)    Has patient had a PCN reaction causing immediate rash, facial/tongue/throat swelling, SOB or lightheadedness with hypotension: Yes Has patient had a PCN reaction causing severe rash involving mucus membranes or skin necrosis: No Has patient had a PCN reaction that required hospitalization No Has patient had a PCN reaction occurring within the last 10 years: Yes If all of  the above answers are "NO", then may proceed with Cephalosporin use.  Patient has tolerated cephalosporins in 2017.   Tape Other (See Comments)    NO PAPER TAPE-MUST BE MEDICAL TAPE - unknown reaction NO PAPER TAPE-MUST BE MEDICAL TAPE - unknown reaction   Wool Alcohol [Lanolin] Hives   Amoxicillin Rash    Patient has tolerated cephalosporins   Ampicillin Itching and Rash    Patient has tolerated cephalosporins    Medications Prior to Admission  Medication Sig Dispense Refill   amiodarone (PACERONE) 200 MG tablet Take 0.5 tablets (100 mg total) by mouth daily. 45 tablet 3   cephALEXin (KEFLEX) 500 MG capsule Take 1 capsule (500 mg total) by mouth 3 (three) times daily for 7 days. 21 capsule 0   cholecalciferol (VITAMIN D) 25 MCG (1000 UNIT) tablet Take 1 tablet (1,000 Units total) by mouth daily. 28 tablet 1   doxycycline (VIBRAMYCIN) 100 MG capsule Take 1 capsule (100 mg total) by mouth 2 (two) times daily for 7 days. One po bid x 7 days 14 capsule 0   edoxaban (SAVAYSA) 60 MG TABS tablet Take 60 mg by mouth daily. 30 tablet 11   fluticasone (FLONASE) 50 MCG/ACT nasal spray Place 2 sprays into both nostrils daily as needed. For congestion. 16 g 1   losartan (COZAAR) 25 MG tablet Take 0.5 tablets (12.5 mg total) by mouth daily. 15 tablet 6   omeprazole (PRILOSEC) 20 MG capsule TAKE ONE CAPSULE BY MOUTH EVERY MORNING (Patient taking differently: Take 20 mg by mouth daily. TAKE ONE CAPSULE BY MOUTH EVERY MORNING) 30 capsule 2   PHENobarbital (LUMINAL) 64.8 MG tablet TAKE ONE TABLET BY MOUTH TWICE DAILY (Patient taking differently: Take 64.8 mg by mouth 2 (two) times daily.) 56 tablet 3   potassium chloride (KLOR-CON) 10 MEQ tablet TAKE 4 TABLETS BY MOUTH THREE TIMES DAILY (MORNING, NOON, AND NIGHT) (Patient taking differently: Take 40 mEq by mouth 3 (three) times daily. TAKE 4 TABLETS BY MOUTH THREE TIMES DAILY (MORNING, NOON, AND NIGHT)) 360 tablet 6   spironolactone (ALDACTONE) 25 MG tablet  Take 1 tablet (25 mg total) by mouth daily. 30 tablet 6   torsemide (DEMADEX) 20 MG tablet Take 4 tablets (80 mg total) by mouth in the morning AND 3 tablets (60 mg total) every evening. 210 tablet 6   oxyCODONE-acetaminophen (PERCOCET/ROXICET) 5-325 MG tablet Take 0.5-1 tablets by mouth every 6 (six) hours as needed for severe pain. (Patient not taking: Reported on  05/31/2021) 5 tablet 0    No results found. However, due to the size of the patient record, not all encounters were searched. Please check Results Review for a complete set of results. No results found.  Review of Systems  All other systems reviewed and are negative.  Blood pressure (!) 101/37, pulse 69, temperature 98.7 F (37.1 C), temperature source Oral, resp. rate (!) 22, height 4\' 9"  (1.448 m), weight 77.2 kg, SpO2 100 %. Physical Exam Constitutional:      Appearance: Normal appearance.  HENT:     Head: Normocephalic and atraumatic.  Eyes:     Pupils: Pupils are equal, round, and reactive to light.  Cardiovascular:     Rate and Rhythm: Normal rate.  Pulmonary:     Effort: Pulmonary effort is normal.  Musculoskeletal:     Left wrist: Swelling, deformity and bony tenderness present. Decreased range of motion.     Cervical back: Normal range of motion.     Comments: Displaced intra-articular distal radius fracture left side  Neurological:     General: No focal deficit present.     Mental Status: She is alert and oriented to person, place, and time.  Psychiatric:        Mood and Affect: Mood normal.        Behavior: Behavior normal.        Thought Content: Thought content normal.        Judgment: Judgment normal.     Assessment/Plan 70 year old female status post fall with displaced intra-articular fracture distal radius left side.  I discussed the role of operative fixation of this fracture as an outpatient under regional anesthetic and IV sedation.  She understands risks and benefits the fact that we cannot  guarantee complete pain relief and that she might need further surgery in the future.  We will get this done today at [facility] with anticipated outpatient discharge. [facility], MD 06/01/2021, 12:55 PM

## 2021-06-04 ENCOUNTER — Other Ambulatory Visit (HOSPITAL_COMMUNITY): Payer: Self-pay

## 2021-06-04 NOTE — Progress Notes (Addendum)
Paramedicine Encounter    Patient ID: Misty Gray, female    DOB: 11/12/1951, 70 y.o.   MRN: [id number]   Patient Care Team: Latrelle Dodrill, MD as PCP - General (Family Medicine) [name], MD (Cardiology) Burna Sis, LCSW as Social Worker (Licensed Clinical Social Worker)  Patient Active Problem List   Diagnosis Date Noted  . Hand injury, left, initial encounter 09/22/2020  . CHF exacerbation (HCC) 07/12/2020  . Hospital discharge follow-up 11/17/2019  . Onychomycosis 11/17/2019  . Abdominal wall hernia 11/05/2017  . Anticoagulated 11/05/2017  . Rash and nonspecific skin eruption 09/15/2017  . Ventral hernia without obstruction or gangrene 08/15/2017  . Skin irritation 07/23/2017  . Chronic venous stasis dermatitis 12/26/2016  . Urinary tract infection without hematuria   . Frequent PVCs   . Hypokalemia   . Housing problems 11/23/2016  . Chronic atrial fibrillation (HCC) 11/01/2016  . Hypotension 10/31/2016  . Healthcare maintenance 08/12/2016  . Heme positive stool 08/09/2016  . Intertrigo 08/09/2016  . Chronic anticoagulation 04/26/2016  . Chronic venous insufficiency   . Cellulitis 09/07/2015  . Breast mass, left 10/05/2014  . Osteopenia 09/15/2014  . Hypertension 01/04/2014  . Chronic systolic dysfunction of left ventricle 05/05/2013  . At high risk for falls 02/11/2013  . Vitamin D deficiency 07/15/2012  . Sinoatrial node dysfunction (HCC) 05/08/2011  . Pacemaker 08/10/2009  . Allergic rhinitis 05/31/2008  . Morbid obesity (HCC) 02/12/2007  . GASTROESOPHAGEAL REFLUX, NO ESOPHAGITIS 02/12/2007  . Female stress incontinence 02/12/2007  . Seizure disorder (HCC) 02/12/2007  . OSA (obstructive sleep apnea) 02/12/2007    Current Outpatient Medications:  .  amiodarone (PACERONE) 200 MG tablet, Take 0.5 tablets (100 mg total) by mouth daily., Disp: 45 tablet, Rfl: 3 .  cephALEXin (KEFLEX) 500 MG capsule, Take 1 capsule (500 mg total) by mouth 3 (three)  times daily for 7 days., Disp: 21 capsule, Rfl: 0 .  cholecalciferol (VITAMIN D) 25 MCG (1000 UNIT) tablet, Take 1 tablet (1,000 Units total) by mouth daily., Disp: 28 tablet, Rfl: 1 .  doxycycline (VIBRAMYCIN) 100 MG capsule, Take 1 capsule (100 mg total) by mouth 2 (two) times daily for 7 days. One po bid x 7 days, Disp: 14 capsule, Rfl: 0 .  edoxaban (SAVAYSA) 60 MG TABS tablet, Take 60 mg by mouth daily., Disp: 30 tablet, Rfl: 11 .  fluticasone (FLONASE) 50 MCG/ACT nasal spray, Place 2 sprays into both nostrils daily as needed. For congestion., Disp: 16 g, Rfl: 1 .  losartan (COZAAR) 25 MG tablet, Take 0.5 tablets (12.5 mg total) by mouth daily., Disp: 15 tablet, Rfl: 6 .  omeprazole (PRILOSEC) 20 MG capsule, TAKE ONE CAPSULE BY MOUTH EVERY MORNING (Patient taking differently: Take 20 mg by mouth daily. TAKE ONE CAPSULE BY MOUTH EVERY MORNING), Disp: 30 capsule, Rfl: 2 .  oxyCODONE-acetaminophen (PERCOCET) 5-325 MG tablet, Take 1 tablet by mouth every 4 (four) hours as needed for severe pain., Disp: 20 tablet, Rfl: 0 .  oxyCODONE-acetaminophen (PERCOCET/ROXICET) 5-325 MG tablet, Take 0.5-1 tablets by mouth every 6 (six) hours as needed for severe pain., Disp: 5 tablet, Rfl: 0 .  PHENobarbital (LUMINAL) 64.8 MG tablet, TAKE ONE TABLET BY MOUTH TWICE DAILY, Disp: 56 tablet, Rfl: 3 .  potassium chloride (KLOR-CON) 10 MEQ tablet, TAKE 4 TABLETS BY MOUTH THREE TIMES DAILY (MORNING, NOON, AND NIGHT), Disp: 360 tablet, Rfl: 6 .  spironolactone (ALDACTONE) 25 MG tablet, Take 1 tablet (25 mg total) by mouth daily., Disp: 30 tablet,  Rfl: 6 .  torsemide (DEMADEX) 20 MG tablet, Take 4 tablets (80 mg total) by mouth in the morning AND 3 tablets (60 mg total) every evening., Disp: 210 tablet, Rfl: 6 Allergies  Allergen Reactions  . Aspirin Hives and Rash  . Penicillins Rash and Other (See Comments)    Has patient had a PCN reaction causing immediate rash, facial/tongue/throat swelling, SOB or lightheadedness  with hypotension: Yes Has patient had a PCN reaction causing severe rash involving mucus membranes or skin necrosis: No Has patient had a PCN reaction that required hospitalization No Has patient had a PCN reaction occurring within the last 10 years: Yes If all of the above answers are "NO", then may proceed with Cephalosporin use.  Patient has tolerated cephalosporins in 2017.  . Tape Other (See Comments)    NO PAPER TAPE-MUST BE MEDICAL TAPE - unknown reaction NO PAPER TAPE-MUST BE MEDICAL TAPE - unknown reaction  . Wool Alcohol [Lanolin] Hives  . Amoxicillin Rash    Patient has tolerated cephalosporins  . Ampicillin Itching and Rash    Patient has tolerated cephalosporins      Social History   Socioeconomic History  . Marital status: Married    Spouse name: [name]  . Number of children: 4  . Years of education: 33  . Highest education level: Not on file  Occupational History  . Occupation: disabled  Tobacco Use  . Smoking status: Former    Packs/day: 1.00    Years: 7.00    Pack years: 7.00    Types: Cigarettes    Quit date: 03/16/1969    Years since quitting: 52.2  . Smokeless tobacco: Never  Vaping Use  . Vaping Use: Never used  Substance and Sexual Activity  . Alcohol use: No  . Drug use: No  . Sexual activity: Not Currently    Birth control/protection: Post-menopausal  Other Topics Concern  . Not on file  Social History Narrative   Lives with husband [name] lives with them as do his 2 children   Daughter, [name], and her 3 children live in [place] -  [name] is incarcerated   Nephew, Modesty Rudy lives with them   Daughter, [name], in [name] [address]   Moved June 2013 to a better home on [facility].      Lives with husband, son, grandson, granddaughter, nephew.   Social Determinants of Health   Financial Resource Strain: Medium Risk  . Difficulty of Paying Living Expenses: Somewhat  hard  Food Insecurity: No Food Insecurity  . Worried About Programme researcher, broadcasting/film/video in the Last Year: Never true  . Ran Out of Food in the Last Year: Never true  Transportation Needs: No Transportation Needs  . Lack of Transportation (Medical): No  . Lack of Transportation (Non-Medical): No  Physical Activity: Not on file  Stress: Not on file  Social Connections: Not on file  Intimate Partner Violence: Not on file    Physical Exam      Future Appointments  Date Time Provider Department Center  06/05/2021 10:50 AM Towanda Octave, MD [facility]  06/11/2021  7:55 AM [facility] DEVICE REMOTES [name]  06/27/2021  2:00 PM [facility] NL VASC 1 [facility]  07/04/2021 10:40 AM [facility] DEVICE 1 [name]  09/24/2021  2:00 PM Allred, Fayrene Fearing, MD [name]    BP 110/70   Pulse 78   Resp 18  SpO2 93%   Weight yesterday-170 Last visit weight-166 @ clinic  Pt had her surgery last Friday, she did have to take a pain pill this morning. She had been up walking around this morning and dealing with the dogs this morning and she was a little more sob, but she reports that is normal. She only has one bandaid on that place on the rt leg, they were given a few extra bandages but now they have lost them. Her legs look purple today, she does go back to PCP tomor for f/u on those legs.  She has missed numerous doses of her meds-3 afternoon doses, 5 PM doses.  I placed the antibiotics in her pill box so she wont miss any.  [facility].  She has enough thru sat morn.  Meds verified and pill box refilled.    Kerry Hough, EMT-Paramedic [phone] [facility] Paramedic  06/04/21

## 2021-06-05 ENCOUNTER — Ambulatory Visit (INDEPENDENT_AMBULATORY_CARE_PROVIDER_SITE_OTHER): Payer: Medicare Other | Admitting: Family Medicine

## 2021-06-05 ENCOUNTER — Encounter (HOSPITAL_COMMUNITY): Payer: Self-pay | Admitting: Orthopedic Surgery

## 2021-06-05 DIAGNOSIS — G4733 Obstructive sleep apnea (adult) (pediatric): Secondary | ICD-10-CM | POA: Diagnosis not present

## 2021-06-05 DIAGNOSIS — Z5329 Procedure and treatment not carried out because of patient's decision for other reasons: Secondary | ICD-10-CM

## 2021-06-05 DIAGNOSIS — R0902 Hypoxemia: Secondary | ICD-10-CM | POA: Diagnosis not present

## 2021-06-05 NOTE — Progress Notes (Signed)
No show

## 2021-06-05 NOTE — Progress Notes (Signed)
Remote pacemaker transmission.   

## 2021-06-07 ENCOUNTER — Other Ambulatory Visit (HOSPITAL_COMMUNITY): Payer: Self-pay | Admitting: Internal Medicine

## 2021-06-07 DIAGNOSIS — Z4789 Encounter for other orthopedic aftercare: Secondary | ICD-10-CM | POA: Diagnosis not present

## 2021-06-07 DIAGNOSIS — Z789 Other specified health status: Secondary | ICD-10-CM | POA: Diagnosis not present

## 2021-06-07 DIAGNOSIS — R29898 Other symptoms and signs involving the musculoskeletal system: Secondary | ICD-10-CM | POA: Diagnosis not present

## 2021-06-07 DIAGNOSIS — S52552A Other extraarticular fracture of lower end of left radius, initial encounter for closed fracture: Secondary | ICD-10-CM | POA: Diagnosis not present

## 2021-06-07 DIAGNOSIS — M25632 Stiffness of left wrist, not elsewhere classified: Secondary | ICD-10-CM | POA: Diagnosis not present

## 2021-06-11 ENCOUNTER — Other Ambulatory Visit (HOSPITAL_COMMUNITY): Payer: Self-pay

## 2021-06-11 NOTE — Progress Notes (Addendum)
Paramedicine Encounter    Patient ID: Misty Gray, female    DOB: 1951-04-03, 70 y.o.   MRN: [id number]   Patient Care Team: Latrelle Dodrill, MD as PCP - General (Family Medicine) [name], MD (Cardiology) Burna Sis, LCSW as Social Worker (Licensed Clinical Social Worker)  Patient Active Problem List   Diagnosis Date Noted   Hand injury, left, initial encounter 09/22/2020   CHF exacerbation (HCC) 07/12/2020   Hospital discharge follow-up 11/17/2019   Onychomycosis 11/17/2019   Abdominal wall hernia 11/05/2017   Anticoagulated 11/05/2017   Rash and nonspecific skin eruption 09/15/2017   Ventral hernia without obstruction or gangrene 08/15/2017   Skin irritation 07/23/2017   Chronic venous stasis dermatitis 12/26/2016   Urinary tract infection without hematuria    Frequent PVCs    Hypokalemia    Housing problems 11/23/2016   Chronic atrial fibrillation (HCC) 11/01/2016   Hypotension 10/31/2016   Healthcare maintenance 08/12/2016   Heme positive stool 08/09/2016   Intertrigo 08/09/2016   Chronic anticoagulation 04/26/2016   Chronic venous insufficiency    Cellulitis 09/07/2015   Breast mass, left 10/05/2014   Osteopenia 09/15/2014   Hypertension 01/04/2014   Chronic systolic dysfunction of left ventricle 05/05/2013   At high risk for falls 02/11/2013   Vitamin D deficiency 07/15/2012   Sinoatrial node dysfunction (HCC) 05/08/2011   Pacemaker 08/10/2009   Allergic rhinitis 05/31/2008   Morbid obesity (HCC) 02/12/2007   GASTROESOPHAGEAL REFLUX, NO ESOPHAGITIS 02/12/2007   Female stress incontinence 02/12/2007   Seizure disorder (HCC) 02/12/2007   OSA (obstructive sleep apnea) 02/12/2007    Current Outpatient Medications:    amiodarone (PACERONE) 200 MG tablet, Take 0.5 tablets (100 mg total) by mouth daily., Disp: 45 tablet, Rfl: 3   cholecalciferol (VITAMIN D) 25 MCG (1000 UNIT) tablet, Take 1 tablet (1,000 Units total) by mouth daily., Disp: 28 tablet,  Rfl: 1   edoxaban (SAVAYSA) 60 MG TABS tablet, Take 60 mg by mouth daily., Disp: 30 tablet, Rfl: 11   fluticasone (FLONASE) 50 MCG/ACT nasal spray, Place 2 sprays into both nostrils daily as needed. For congestion., Disp: 16 g, Rfl: 1   losartan (COZAAR) 25 MG tablet, TAKE 1/2 TABLET BY MOUTH EVERY MORNING, Disp: 15 tablet, Rfl: 6   omeprazole (PRILOSEC) 20 MG capsule, TAKE ONE CAPSULE BY MOUTH EVERY MORNING (Patient taking differently: Take 20 mg by mouth daily. TAKE ONE CAPSULE BY MOUTH EVERY MORNING), Disp: 30 capsule, Rfl: 2   oxyCODONE-acetaminophen (PERCOCET) 5-325 MG tablet, Take 1 tablet by mouth every 4 (four) hours as needed for severe pain., Disp: 20 tablet, Rfl: 0   oxyCODONE-acetaminophen (PERCOCET/ROXICET) 5-325 MG tablet, Take 0.5-1 tablets by mouth every 6 (six) hours as needed for severe pain., Disp: 5 tablet, Rfl: 0   PHENobarbital (LUMINAL) 64.8 MG tablet, TAKE ONE TABLET BY MOUTH TWICE DAILY, Disp: 56 tablet, Rfl: 3   potassium chloride (KLOR-CON) 10 MEQ tablet, TAKE 4 TABLETS BY MOUTH THREE TIMES DAILY (MORNING, NOON, AND NIGHT), Disp: 360 tablet, Rfl: 6   spironolactone (ALDACTONE) 25 MG tablet, Take 1 tablet (25 mg total) by mouth daily., Disp: 30 tablet, Rfl: 6   torsemide (DEMADEX) 20 MG tablet, Take 4 tablets (80 mg total) by mouth in the morning AND 3 tablets (60 mg total) every evening., Disp: 210 tablet, Rfl: 6 Allergies  Allergen Reactions   Aspirin Hives and Rash   Penicillins Rash and Other (See Comments)    Has patient had a PCN reaction causing immediate  rash, facial/tongue/throat swelling, SOB or lightheadedness with hypotension: Yes Has patient had a PCN reaction causing severe rash involving mucus membranes or skin necrosis: No Has patient had a PCN reaction that required hospitalization No Has patient had a PCN reaction occurring within the last 10 years: Yes If all of the above answers are "NO", then may proceed with Cephalosporin use.  Patient has  tolerated cephalosporins in 2017.   Tape Other (See Comments)    NO PAPER TAPE-MUST BE MEDICAL TAPE - unknown reaction NO PAPER TAPE-MUST BE MEDICAL TAPE - unknown reaction   Wool Alcohol [Lanolin] Hives   Amoxicillin Rash    Patient has tolerated cephalosporins   Ampicillin Itching and Rash    Patient has tolerated cephalosporins      Social History   Socioeconomic History   Marital status: Married    Spouse name: [name]   Number of children: 4   Years of education: 12   Highest education level: Not on file  Occupational History   Occupation: disabled  Tobacco Use   Smoking status: Former    Packs/day: 1.00    Years: 7.00    Pack years: 7.00    Types: Cigarettes    Quit date: 03/16/1969    Years since quitting: 52.2   Smokeless tobacco: Never  Vaping Use   Vaping Use: Never used  Substance and Sexual Activity   Alcohol use: No   Drug use: No   Sexual activity: Not Currently    Birth control/protection: Post-menopausal  Other Topics Concern   Not on file  Social History Narrative   Lives with husband [name] lives with them as do his 2 children   Daughter, [name], and her 3 children live in [place]   Son -  [name] is incarcerated   Nephew, Evalene Vath lives with them   Daughter, [name], in [place] [name] [place]   Moved June 2013 to a better home on [address]      Lives with husband, son, grandson, granddaughter, nephew.   Social Determinants of Health   Financial Resource Strain: Medium Risk   Difficulty of Paying Living Expenses: Somewhat hard  Food Insecurity: No Food Insecurity   Worried About Programme researcher, broadcasting/film/video in the Last Year: Never true   Ran Out of Food in the Last Year: Never true  Transportation Needs: No Transportation Needs   Lack of Transportation (Medical): No   Lack of Transportation (Non-Medical): No  Physical Activity: Not on file  Stress: Not on file  Social Connections: Not on  file  Intimate Partner Violence: Not on file    Physical Exam      Future Appointments  Date Time Provider Department Center  06/27/2021  2:00 PM [facility] VASC 1 [facility]  07/04/2021 10:40 AM [facility] DEVICE 1 [name]  09/24/2021  2:00 PM Allred, Fayrene Fearing, MD [name]    BP (!) 92/0   Pulse 79   Resp 18   Wt 174 lb (78.9 kg)   SpO2 98%   BMI 37.65 kg/m   Weight yesterday-? Last visit weight-170  Pt reports doing ok, she was called about her wound center appoint but she couldn't write down the information quick enough so she isnt sure when it was or anything, I am not able to see that in her appoint list either so not able to help that.  Her phenobarbital was set to be delivered last Thursday  however b/c they couldn't get in touch with anyone the pharmacy did not make that delivery. My co-worker was able to pick It up since she was already there and bring it out during my visit so it can be placed in pill box.  I did fill 2 pill boxes for her, I gave her the number and address to her PCP office and told her to call to sch appoint since she missed it last week.  Legs look dryer, doesn't appear to be oozing much anymore, still red, but more purple looking. she is done with those antibiotics.    Kerry Hough, EMT-Paramedic [phone] [facility] Paramedic  06/11/21

## 2021-06-20 NOTE — Pre-Procedure Instructions (Signed)
Instructed patient on the following items: Arrival time 1130 Nothing to eat or drink after midnight No meds AM of procedure Responsible person to drive you home and stay with you for 24 hrs Wash with special soap night before and morning of procedure If on anti-coagulant drug instructions Savaysa- hold tonights dose

## 2021-06-21 ENCOUNTER — Ambulatory Visit: Payer: Medicare Other

## 2021-06-21 ENCOUNTER — Other Ambulatory Visit: Payer: Self-pay

## 2021-06-21 ENCOUNTER — Encounter (HOSPITAL_COMMUNITY)
Admission: RE | Disposition: A | Discharge status: Home / Self Care | Payer: Medicare Other | Source: Home / Self Care | Attending: Internal Medicine

## 2021-06-21 ENCOUNTER — Ambulatory Visit (HOSPITAL_COMMUNITY)
Admission: RE | Admit: 2021-06-21 | Discharge: 2021-06-21 | Disposition: A | Discharge status: Home / Self Care | Payer: Medicare Other | Attending: Internal Medicine | Admitting: Internal Medicine

## 2021-06-21 DIAGNOSIS — I4821 Permanent atrial fibrillation: Secondary | ICD-10-CM | POA: Insufficient documentation

## 2021-06-21 DIAGNOSIS — I5022 Chronic systolic (congestive) heart failure: Secondary | ICD-10-CM | POA: Insufficient documentation

## 2021-06-21 DIAGNOSIS — I110 Hypertensive heart disease with heart failure: Secondary | ICD-10-CM | POA: Diagnosis not present

## 2021-06-21 DIAGNOSIS — Z79899 Other long term (current) drug therapy: Secondary | ICD-10-CM | POA: Insufficient documentation

## 2021-06-21 DIAGNOSIS — I441 Atrioventricular block, second degree: Secondary | ICD-10-CM | POA: Diagnosis not present

## 2021-06-21 DIAGNOSIS — Z7901 Long term (current) use of anticoagulants: Secondary | ICD-10-CM | POA: Diagnosis not present

## 2021-06-21 DIAGNOSIS — R001 Bradycardia, unspecified: Secondary | ICD-10-CM | POA: Diagnosis not present

## 2021-06-21 DIAGNOSIS — Z45010 Encounter for checking and testing of cardiac pacemaker pulse generator [battery]: Secondary | ICD-10-CM | POA: Diagnosis not present

## 2021-06-21 DIAGNOSIS — G4733 Obstructive sleep apnea (adult) (pediatric): Secondary | ICD-10-CM | POA: Diagnosis not present

## 2021-06-21 HISTORY — PX: PPM GENERATOR CHANGEOUT: EP1233

## 2021-06-21 SURGERY — PPM GENERATOR CHANGEOUT

## 2021-06-21 MED ORDER — DIPHENHYDRAMINE HCL 50 MG/ML IJ SOLN
INTRAMUSCULAR | Status: DC | PRN
  Administered 2021-06-21: 25 mg via INTRAVENOUS

## 2021-06-21 MED ORDER — LIDOCAINE HCL (PF) 1 % IJ SOLN
INTRAMUSCULAR | Status: DC | PRN
  Administered 2021-06-21: 60 mL

## 2021-06-21 MED ORDER — SODIUM CHLORIDE 0.9 % IV SOLN
80.0000 mg | INTRAVENOUS | Status: AC
  Administered 2021-06-21: 80 mg

## 2021-06-21 MED ORDER — CHLORHEXIDINE GLUCONATE 4 % EX LIQD
4.0000 "application " | Freq: Once | CUTANEOUS | Status: DC
  Filled 2021-06-21: qty 60

## 2021-06-21 MED ORDER — SODIUM CHLORIDE 0.9 % IV SOLN: INTRAVENOUS | Status: DC

## 2021-06-21 MED ORDER — DIPHENHYDRAMINE HCL 50 MG/ML IJ SOLN
INTRAMUSCULAR | Status: AC
  Filled 2021-06-21: qty 1

## 2021-06-21 MED ORDER — SODIUM CHLORIDE 0.9 % IV SOLN
INTRAVENOUS | Status: AC
  Filled 2021-06-21: qty 2

## 2021-06-21 MED ORDER — SODIUM CHLORIDE 0.9 % IV SOLN: 250.0000 mL | INTRAVENOUS | Status: DC | PRN

## 2021-06-21 MED ORDER — SODIUM CHLORIDE 0.9% FLUSH: 3.0000 mL | INTRAVENOUS | Status: DC | PRN

## 2021-06-21 MED ORDER — VANCOMYCIN HCL IN DEXTROSE 1-5 GM/200ML-% IV SOLN
1000.0000 mg | INTRAVENOUS | Status: AC
  Administered 2021-06-21: 1000 mg via INTRAVENOUS
  Filled 2021-06-21: qty 200

## 2021-06-21 MED ORDER — VANCOMYCIN HCL IN DEXTROSE 1-5 GM/200ML-% IV SOLN
INTRAVENOUS | Status: AC
  Filled 2021-06-21: qty 200

## 2021-06-21 MED ORDER — LIDOCAINE HCL (PF) 1 % IJ SOLN
INTRAMUSCULAR | Status: AC
  Filled 2021-06-21: qty 60

## 2021-06-21 MED ORDER — ACETAMINOPHEN 325 MG PO TABS
325.0000 mg | ORAL_TABLET | ORAL | Status: DC | PRN
  Filled 2021-06-21: qty 2

## 2021-06-21 MED ORDER — ONDANSETRON HCL 4 MG/2ML IJ SOLN: 4.0000 mg | Freq: Four times a day (QID) | INTRAMUSCULAR | Status: DC | PRN

## 2021-06-21 MED ORDER — SODIUM CHLORIDE 0.9% FLUSH: 3.0000 mL | Freq: Two times a day (BID) | INTRAVENOUS | Status: DC

## 2021-06-21 SURGICAL SUPPLY — 6 items
CABLE SURGICAL S-101-97-12 (CABLE) ×2 IMPLANT
PACEMAKER ASSURITY DR-RF (Pacemaker) ×1 IMPLANT
PAD PRO RADIOLUCENT 2001M-C (PAD) ×2 IMPLANT
POUCH AIGIS-R ANTIBACT ICD (Mesh General) ×2 IMPLANT
POUCH AIGIS-R ANTIBACT ICD LRG (Mesh General) IMPLANT
TRAY PACEMAKER INSERTION (PACKS) ×2 IMPLANT

## 2021-06-21 NOTE — Progress Notes (Signed)
Pt ambulated with assistance and without difficulty or bleeding.  Discharged home with her son who will drive and stay with pt x 24 hrs.

## 2021-06-21 NOTE — Interval H&P Note (Signed)
History and Physical Interval Note:  06/21/2021 12:48 PM  Misty Gray  has presented today for surgery, with the diagnosis of eri.  The various methods of treatment have been discussed with the patient and family. After consideration of risks, benefits and other options for treatment, the patient has consented to  Procedure(s): PPM GENERATOR CHANGEOUT (N/A) as a surgical intervention.  The patient's history has been reviewed, patient examined, no change in status, stable for surgery.  I have reviewed the patient's chart and labs.  Questions were answered to the patient's satisfaction.     Risks, benefits, and alternatives to PPM pulse generator replacement were again discussed in detail today.  The patient understands that risks include but are not limited to bleeding, infection, pneumothorax, perforation, tamponade, vascular damage, renal failure, MI, stroke, death,  damage to his existing leads, and lead dislodgement and wishes to proceed.  She remains clear that she is NOT interested in an ICD upgrade.  She does not meet criteria for CRT.  Her PPM leads are from 86.  She is clear that she would like to avoid replacement if possible. I have examined her leg wounds and they have substantially improved.  I feel that she is ok to proceed with PPM generator change at this time.   [name]

## 2021-06-21 NOTE — Discharge Instructions (Addendum)
Hold exodaban (savaysa).  Ok to resume on 06/24/21 am doseImplantable Cardiac Device Battery Change, Care After  This sheet gives you information about how to care for yourself after your procedure. Your health care provider may also give you more specific instructions. If you have problems or questions, contact your health care provider. What can I expect after the procedure? After your procedure, it is common to have: Pain or soreness at the site where the cardiac device was inserted. Swelling at the site where the cardiac device was inserted. You should received an information card for your new device in 4-8 weeks. Follow these instructions at home: Incision care  Keep the incision clean and dry. Do not take baths, swim, or use a hot tub until after your wound check.  Do not shower for at least 7 days, or as directed by your health care provider. Pat the area dry with a clean towel. Do not rub the area. This may cause bleeding. Follow instructions from your health care provider about how to take care of your incision. Make sure you: Leave stitches (sutures), skin glue, or adhesive strips in place. These skin closures may need to stay in place for 2 weeks or longer. If adhesive strip edges start to loosen and curl up, you may trim the loose edges. Do not remove adhesive strips completely unless your health care provider tells you to do that. Check your incision area every day for signs of infection. Check for: More redness, swelling, or pain. More fluid or blood. Warmth. Pus or a bad smell. Activity Do not lift anything that is heavier than 10 lb (4.5 kg) until your health care provider says it is okay to do so. For the first week, or as long as told by your health care provider: Avoid lifting your affected arm higher than your shoulder. After 1 week, Be gentle when you move your arms over your head. It is okay to raise your arm to comb your hair. Avoid strenuous exercise. Ask your health  care provider when it is okay to: Resume your normal activities. Return to work or school. Resume sexual activity. Eating and drinking Eat a heart-healthy diet. This should include plenty of fresh fruits and vegetables, whole grains, low-fat dairy products, and lean protein like chicken and fish. Limit alcohol intake to no more than 1 drink a day for non-pregnant women and 2 drinks a day for men. One drink equals 12 oz of beer, 5 oz of wine, or 1 oz of hard liquor. Check ingredients and nutrition facts on packaged foods and beverages. Avoid the following types of food: Food that is high in salt (sodium). Food that is high in saturated fat, like full-fat dairy or red meat. Food that is high in trans fat, like fried food. Food and drinks that are high in sugar. Lifestyle Do not use any products that contain nicotine or tobacco, such as cigarettes and e-cigarettes. If you need help quitting, ask your health care provider. Take steps to manage and control your weight. Once cleared, get regular exercise. Aim for 150 minutes of moderate-intensity exercise (such as walking or yoga) or 75 minutes of vigorous exercise (such as running or swimming) each week. Manage other health problems, such as diabetes or high blood pressure. Ask your health care provider how you can manage these conditions. General instructions Do not drive for 24 hours after your procedure if you were given a medicine to help you relax (sedative). Take over-the-counter and prescription medicines only  as told by your health care provider. Avoid putting pressure on the area where the cardiac device was placed. If you need an MRI after your cardiac device has been placed, be sure to tell the health care provider who orders the MRI that you have a cardiac device. Avoid close and prolonged exposure to electrical devices that have strong magnetic fields. These include: Cell phones. Avoid keeping them in a pocket near the cardiac device,  and try using the ear opposite the cardiac device. MP3 players. Household appliances, like microwaves. Metal detectors. Electric generators. High-tension wires. Keep all follow-up visits as directed by your health care provider. This is important. Contact a health care provider if: You have pain at the incision site that is not relieved by over-the-counter or prescription medicines. You have any of these around your incision site or coming from it: More redness, swelling, or pain. Fluid or blood. Warmth to the touch. Pus or a bad smell. You have a fever. You feel brief, occasional palpitations, light-headedness, or any symptoms that you think might be related to your heart. Get help right away if: You experience chest pain that is different from the pain at the cardiac device site. You develop a red streak that extends above or below the incision site. You experience shortness of breath. You have palpitations or an irregular heartbeat. You have light-headedness that does not go away quickly. You faint or have dizzy spells. Your pulse suddenly drops or increases rapidly and does not return to normal. You begin to gain weight and your legs and ankles swell. Summary After your procedure, it is common to have pain, soreness, and some swelling where the cardiac device was inserted. Make sure to keep your incision clean and dry. Follow instructions from your health care provider about how to take care of your incision. Check your incision every day for signs of infection, such as more pain or swelling, pus or a bad smell, warmth, or leaking fluid and blood. Avoid strenuous exercise and lifting your left arm higher than your shoulder for 2 weeks, or as long as told by your health care provider. This information is not intended to replace advice given to you by your health care provider. Make sure you discuss any questions you have with your health care provider.

## 2021-06-22 ENCOUNTER — Encounter (HOSPITAL_COMMUNITY): Payer: Self-pay | Admitting: Internal Medicine

## 2021-06-25 ENCOUNTER — Other Ambulatory Visit (HOSPITAL_COMMUNITY): Payer: Self-pay

## 2021-06-25 NOTE — Progress Notes (Addendum)
Paramedicine Encounter    Patient ID: Misty Gray, female    DOB: 1951-07-09, 70 y.o.   MRN: [id number]   Patient Care Team: Latrelle Dodrill, MD as PCP - General (Family Medicine) [name], MD (Cardiology) Burna Sis, LCSW as Social Worker (Licensed Clinical Social Worker)  Patient Active Problem List   Diagnosis Date Noted   Hand injury, left, initial encounter 09/22/2020   CHF exacerbation (HCC) 07/12/2020   Hospital discharge follow-up 11/17/2019   Onychomycosis 11/17/2019   Abdominal wall hernia 11/05/2017   Anticoagulated 11/05/2017   Rash and nonspecific skin eruption 09/15/2017   Ventral hernia without obstruction or gangrene 08/15/2017   Skin irritation 07/23/2017   Chronic venous stasis dermatitis 12/26/2016   Urinary tract infection without hematuria    Frequent PVCs    Hypokalemia    Housing problems 11/23/2016   Chronic atrial fibrillation (HCC) 11/01/2016   Hypotension 10/31/2016   Healthcare maintenance 08/12/2016   Heme positive stool 08/09/2016   Intertrigo 08/09/2016   Chronic anticoagulation 04/26/2016   Chronic venous insufficiency    Cellulitis 09/07/2015   Breast mass, left 10/05/2014   Osteopenia 09/15/2014   Hypertension 01/04/2014   Chronic systolic dysfunction of left ventricle 05/05/2013   At high risk for falls 02/11/2013   Vitamin D deficiency 07/15/2012   Sinoatrial node dysfunction (HCC) 05/08/2011   Pacemaker 08/10/2009   Allergic rhinitis 05/31/2008   Morbid obesity (HCC) 02/12/2007   GASTROESOPHAGEAL REFLUX, NO ESOPHAGITIS 02/12/2007   Female stress incontinence 02/12/2007   Seizure disorder (HCC) 02/12/2007   OSA (obstructive sleep apnea) 02/12/2007    Current Outpatient Medications:    amiodarone (PACERONE) 200 MG tablet, Take 0.5 tablets (100 mg total) by mouth daily., Disp: 45 tablet, Rfl: 3   cholecalciferol (VITAMIN D) 25 MCG (1000 UNIT) tablet, Take 1 tablet (1,000 Units total) by mouth daily., Disp: 28 tablet,  Rfl: 1   edoxaban (SAVAYSA) 60 MG TABS tablet, Take 60 mg by mouth daily., Disp: 30 tablet, Rfl: 11   fluticasone (FLONASE) 50 MCG/ACT nasal spray, Place 2 sprays into both nostrils daily as needed. For congestion., Disp: 16 g, Rfl: 1   losartan (COZAAR) 25 MG tablet, TAKE 1/2 TABLET BY MOUTH EVERY MORNING, Disp: 15 tablet, Rfl: 6   omeprazole (PRILOSEC) 20 MG capsule, TAKE ONE CAPSULE BY MOUTH EVERY MORNING (Patient taking differently: Take 20 mg by mouth daily. TAKE ONE CAPSULE BY MOUTH EVERY MORNING), Disp: 30 capsule, Rfl: 2   oxyCODONE-acetaminophen (PERCOCET) 5-325 MG tablet, Take 1 tablet by mouth every 4 (four) hours as needed for severe pain., Disp: 20 tablet, Rfl: 0   oxyCODONE-acetaminophen (PERCOCET/ROXICET) 5-325 MG tablet, Take 0.5-1 tablets by mouth every 6 (six) hours as needed for severe pain., Disp: 5 tablet, Rfl: 0   PHENobarbital (LUMINAL) 64.8 MG tablet, TAKE ONE TABLET BY MOUTH TWICE DAILY, Disp: 56 tablet, Rfl: 3   potassium chloride (KLOR-CON) 10 MEQ tablet, TAKE 4 TABLETS BY MOUTH THREE TIMES DAILY (MORNING, NOON, AND NIGHT), Disp: 360 tablet, Rfl: 6   spironolactone (ALDACTONE) 25 MG tablet, Take 1 tablet (25 mg total) by mouth daily., Disp: 30 tablet, Rfl: 6   torsemide (DEMADEX) 20 MG tablet, Take 4 tablets (80 mg total) by mouth in the morning AND 3 tablets (60 mg total) every evening., Disp: 210 tablet, Rfl: 6 Allergies  Allergen Reactions   Aspirin Hives and Rash   Penicillins Rash and Other (See Comments)    Has patient had a PCN reaction causing immediate  rash, facial/tongue/throat swelling, SOB or lightheadedness with hypotension: Yes Has patient had a PCN reaction causing severe rash involving mucus membranes or skin necrosis: No Has patient had a PCN reaction that required hospitalization No Has patient had a PCN reaction occurring within the last 10 years: Yes If all of the above answers are "NO", then may proceed with Cephalosporin use.  Patient has  tolerated cephalosporins in 2017.   Tape Other (See Comments)    NO PAPER TAPE-MUST BE MEDICAL TAPE - unknown reaction NO PAPER TAPE-MUST BE MEDICAL TAPE - unknown reaction   Wool Alcohol [Lanolin] Hives   Amoxicillin Rash    Patient has tolerated cephalosporins   Ampicillin Itching and Rash    Patient has tolerated cephalosporins      Social History   Socioeconomic History   Marital status: Married    Spouse name: [name]   Number of children: 4   Years of education: 12   Highest education level: Not on file  Occupational History   Occupation: disabled  Tobacco Use   Smoking status: Former    Packs/day: 1.00    Years: 7.00    Pack years: 7.00    Types: Cigarettes    Quit date: 03/16/1969    Years since quitting: 52.3   Smokeless tobacco: Never  Vaping Use   Vaping Use: Never used  Substance and Sexual Activity   Alcohol use: No   Drug use: No   Sexual activity: Not Currently    Birth control/protection: Post-menopausal  Other Topics Concern   Not on file  Social History Narrative   Lives with husband [name] lives with them as do his 2 children   Daughter, [name], and her 3 children live in [place]   Son -  [name] is incarcerated   Nephew, Jacqeline Broers lives with them   Daughter, [name], in [place] - [name] [place]   Moved June 2013 to a better home on [address]      Lives with husband, son, grandson, granddaughter, nephew.   Social Determinants of Health   Financial Resource Strain: Medium Risk   Difficulty of Paying Living Expenses: Somewhat hard  Food Insecurity: No Food Insecurity   Worried About Programme researcher, broadcasting/film/video in the Last Year: Never true   Ran Out of Food in the Last Year: Never true  Transportation Needs: No Transportation Needs   Lack of Transportation (Medical): No   Lack of Transportation (Non-Medical): No  Physical Activity: Not on file  Stress: Not on file  Social Connections: Not on  file  Intimate Partner Violence: Not on file    Physical Exam      Future Appointments  Date Time Provider Department Center  06/27/2021  2:00 PM [facility] VASC 1 [facility]  06/29/2021  2:10 PM ACCESS TO CARE POOL [facility]  07/04/2021 10:40 AM [facility] DEVICE 1 [name]  09/20/2021  7:00 AM [facility] DEVICE REMOTES [name]  09/24/2021  2:00 PM Allred, Fayrene Fearing, MD [name]  12/20/2021  7:00 AM [facility] DEVICE REMOTES [name]  03/21/2022  7:00 AM [facility] DEVICE REMOTES [name]  06/20/2022  7:00 AM [facility] DEVICE REMOTES [name]  09/19/2022  7:00 AM [facility] DEVICE REMOTES [name]  12/19/2022  7:00 AM [facility] DEVICE REMOTES [name]  03/20/2023  7:00 AM [facility] DEVICE REMOTES [name]  06/20/2023  7:05 AM [facility] DEVICE REMOTES [name]    BP 102/60  Pulse (!) 58   Resp 18   Weight yesterday-174 Last visit weight-170  Pt had ICD upgraded last week.  She reports feeling ok. She has been helping her husband around since his health has declined so she has been tired. Slight pain to the site of ICD, looks like she has a very small dot of blood thru bandage, it is bandaged and taped really well. She goes for f/u with that this week.  She has not called PCP to resch her last missed appointment, so I did that for her and her appoint is this Friday.  Meds verified and 2 pill boxes refilled.  unable to get sp02 due to her cold hands.  [facility] so they can place it in bubble pack starting next week.  Herbert Seta will get them and deliver them out to her next week since I will be off work.  I will f/u once I return.   Kerry Hough, EMT-Paramedic [phone] [facility] Paramedic  06/25/21

## 2021-06-27 ENCOUNTER — Other Ambulatory Visit: Payer: Self-pay

## 2021-06-27 ENCOUNTER — Ambulatory Visit (HOSPITAL_COMMUNITY)
Admission: RE | Admit: 2021-06-27 | Discharge: 2021-06-27 | Disposition: A | Discharge status: Home / Self Care | Payer: Medicare Other | Source: Ambulatory Visit | Attending: Cardiovascular Disease | Admitting: Cardiovascular Disease

## 2021-06-27 DIAGNOSIS — L97918 Non-pressure chronic ulcer of unspecified part of right lower leg with other specified severity: Secondary | ICD-10-CM

## 2021-06-27 DIAGNOSIS — L97919 Non-pressure chronic ulcer of unspecified part of right lower leg with unspecified severity: Secondary | ICD-10-CM

## 2021-06-28 DIAGNOSIS — S52552D Other extraarticular fracture of lower end of left radius, subsequent encounter for closed fracture with routine healing: Secondary | ICD-10-CM | POA: Diagnosis not present

## 2021-06-29 ENCOUNTER — Other Ambulatory Visit: Payer: Self-pay

## 2021-06-29 ENCOUNTER — Ambulatory Visit (INDEPENDENT_AMBULATORY_CARE_PROVIDER_SITE_OTHER): Payer: Medicare Other | Admitting: Family Medicine

## 2021-06-29 VITALS — BP 124/80 | HR 76 | Wt 161.6 lb

## 2021-06-29 DIAGNOSIS — Z09 Encounter for follow-up examination after completed treatment for conditions other than malignant neoplasm: Secondary | ICD-10-CM

## 2021-06-29 DIAGNOSIS — I872 Venous insufficiency (chronic) (peripheral): Secondary | ICD-10-CM | POA: Diagnosis not present

## 2021-06-29 NOTE — Progress Notes (Signed)
    SUBJECTIVE:   CHIEF COMPLAINT / HPI:   Misty Gray is a 70 year old female who presents for follow-up for the issue below.  Lower extremity cellulitis No concerns today.  Completed antibiotic course.  Continues to have skin changes she contributes to venous stasis.  Has recently seen wound care and recently removed Unna boots.  Denies fever and lower extremity leg pain.  PERTINENT  PMH / PSH: Atrial fibrillation, CHF, chronic venous stasis, intertrigo  OBJECTIVE:   BP 124/80   Pulse 76   Wt 161 lb 9.6 oz (73.3 kg)   SpO2 98%   BMI 34.97 kg/m   General: Appears well, no acute distress. Age appropriate. Skin: Warm and dry LE with varying degrees of skin changes most concerning for venous stasis. LE are without warm and are not painful to palpation. Appreciate picture below.   Media Information      Document Information  Photos    06/29/2021 14:59  Attached To:  Office Visit on 06/29/21 with ACCESS TO CARE POOL   Source Information  Autry-Lott, Randa Evens, [place] Med Resident    ASSESSMENT/PLAN:   Follow up, Cellulitis (Resolved)  Chronic venous insufficiency Chart review revealed diagnosed with cellulitis 05/29/2021. Completed 7 days of Doxy. Recent follow-up with wound care. No fevers. Skin changes lingering likely due to chronic venous stasis.  Instructed patient to continue follow-up with wound care and podiatry.   Misty Jumbo, DO [facility]

## 2021-06-29 NOTE — Patient Instructions (Signed)
Thank you for following up for your cellulitis.  At this time it appears you continue to have chronic venous stasis.  For this continue to follow-up with wound care and see podiatry.  Dr. Salvadore Dom

## 2021-07-04 ENCOUNTER — Encounter (HOSPITAL_COMMUNITY): Payer: Self-pay

## 2021-07-04 ENCOUNTER — Ambulatory Visit: Payer: Medicare Other

## 2021-07-04 NOTE — Progress Notes (Signed)
Medication bubble pack picked up from [facility] and delivered on 07/04/21 at 1150. I explained how to use and she understood.   Misty Gray will follow up next week.   ACTION: Home visit completed

## 2021-07-05 DIAGNOSIS — G4733 Obstructive sleep apnea (adult) (pediatric): Secondary | ICD-10-CM | POA: Diagnosis not present

## 2021-07-05 DIAGNOSIS — R0902 Hypoxemia: Secondary | ICD-10-CM | POA: Diagnosis not present

## 2021-07-15 ENCOUNTER — Other Ambulatory Visit (HOSPITAL_COMMUNITY): Payer: Self-pay | Admitting: Internal Medicine

## 2021-07-23 ENCOUNTER — Other Ambulatory Visit (HOSPITAL_COMMUNITY): Payer: Self-pay

## 2021-07-23 NOTE — Progress Notes (Signed)
Paramedicine Encounter    Patient ID: Misty Gray, female    DOB: September 23, 1951, 70 y.o.   MRN: [id number]   Patient Care Team: Latrelle Dodrill, MD as PCP - General (Family Medicine) [name], MD (Cardiology) Burna Sis, LCSW as Social Worker (Licensed Clinical Social Worker)  Patient Active Problem List   Diagnosis Date Noted   Hand injury, left, initial encounter 09/22/2020   CHF exacerbation (HCC) 07/12/2020   Hospital discharge follow-up 11/17/2019   Onychomycosis 11/17/2019   Abdominal wall hernia 11/05/2017   Anticoagulated 11/05/2017   Rash and nonspecific skin eruption 09/15/2017   Ventral hernia without obstruction or gangrene 08/15/2017   Skin irritation 07/23/2017   Chronic venous stasis dermatitis 12/26/2016   Urinary tract infection without hematuria    Frequent PVCs    Hypokalemia    Housing problems 11/23/2016   Chronic atrial fibrillation (HCC) 11/01/2016   Hypotension 10/31/2016   Healthcare maintenance 08/12/2016   Heme positive stool 08/09/2016   Intertrigo 08/09/2016   Chronic anticoagulation 04/26/2016   Chronic venous insufficiency    Cellulitis 09/07/2015   Breast mass, left 10/05/2014   Osteopenia 09/15/2014   Hypertension 01/04/2014   Chronic systolic dysfunction of left ventricle 05/05/2013   At high risk for falls 02/11/2013   Vitamin D deficiency 07/15/2012   Sinoatrial node dysfunction (HCC) 05/08/2011   Pacemaker 08/10/2009   Allergic rhinitis 05/31/2008   Morbid obesity (HCC) 02/12/2007   GASTROESOPHAGEAL REFLUX, NO ESOPHAGITIS 02/12/2007   Female stress incontinence 02/12/2007   Seizure disorder (HCC) 02/12/2007   OSA (obstructive sleep apnea) 02/12/2007    Current Outpatient Medications:    amiodarone (PACERONE) 200 MG tablet, Take 0.5 tablets (100 mg total) by mouth daily., Disp: 45 tablet, Rfl: 3   cholecalciferol (VITAMIN D) 25 MCG (1000 UNIT) tablet, Take 1 tablet (1,000 Units total) by mouth daily., Disp: 28 tablet,  Rfl: 1   edoxaban (SAVAYSA) 60 MG TABS tablet, Take 60 mg by mouth daily., Disp: 30 tablet, Rfl: 11   fluticasone (FLONASE) 50 MCG/ACT nasal spray, Place 2 sprays into both nostrils daily as needed. For congestion., Disp: 16 g, Rfl: 1   losartan (COZAAR) 25 MG tablet, TAKE 1/2 TABLET BY MOUTH EVERY MORNING, Disp: 15 tablet, Rfl: 6   omeprazole (PRILOSEC) 20 MG capsule, TAKE ONE CAPSULE BY MOUTH EVERY MORNING (Patient taking differently: Take 20 mg by mouth daily. TAKE ONE CAPSULE BY MOUTH EVERY MORNING), Disp: 30 capsule, Rfl: 2   oxyCODONE-acetaminophen (PERCOCET) 5-325 MG tablet, Take 1 tablet by mouth every 4 (four) hours as needed for severe pain., Disp: 20 tablet, Rfl: 0   oxyCODONE-acetaminophen (PERCOCET/ROXICET) 5-325 MG tablet, Take 0.5-1 tablets by mouth every 6 (six) hours as needed for severe pain., Disp: 5 tablet, Rfl: 0   PHENobarbital (LUMINAL) 64.8 MG tablet, TAKE ONE TABLET BY MOUTH TWICE DAILY, Disp: 56 tablet, Rfl: 3   potassium chloride (KLOR-CON) 10 MEQ tablet, TAKE 4 TABLETS BY MOUTH THREE TIMES DAILY (MORNING, NOON, AND NIGHT), Disp: 360 tablet, Rfl: 6   spironolactone (ALDACTONE) 25 MG tablet, TAKE ONE TABLET BY MOUTH ONCE DAILY, Disp: 30 tablet, Rfl: 4   torsemide (DEMADEX) 20 MG tablet, TAKE 4 TABLETS BY MOUTH EVERY MORNING and TAKE THREE TABLETS BY MOUTH EVERY EVENING, Disp: 210 tablet, Rfl: 5 Allergies  Allergen Reactions   Aspirin Hives and Rash   Penicillins Rash and Other (See Comments)    Has patient had a PCN reaction causing immediate rash, facial/tongue/throat swelling, SOB or lightheadedness  with hypotension: Yes Has patient had a PCN reaction causing severe rash involving mucus membranes or skin necrosis: No Has patient had a PCN reaction that required hospitalization No Has patient had a PCN reaction occurring within the last 10 years: Yes If all of the above answers are "NO", then may proceed with Cephalosporin use.  Patient has tolerated cephalosporins in  2017.   Tape Other (See Comments)    NO PAPER TAPE-MUST BE MEDICAL TAPE - unknown reaction NO PAPER TAPE-MUST BE MEDICAL TAPE - unknown reaction   Wool Alcohol [Lanolin] Hives   Amoxicillin Rash    Patient has tolerated cephalosporins   Ampicillin Itching and Rash    Patient has tolerated cephalosporins      Social History   Socioeconomic History   Marital status: Married    Spouse name: [name]   Number of children: 4   Years of education: 12   Highest education level: Not on file  Occupational History   Occupation: disabled  Tobacco Use   Smoking status: Former    Packs/day: 1.00    Years: 7.00    Pack years: 7.00    Types: Cigarettes    Quit date: 03/16/1969    Years since quitting: 52.3   Smokeless tobacco: Never  Vaping Use   Vaping Use: Never used  Substance and Sexual Activity   Alcohol use: No   Drug use: No   Sexual activity: Not Currently    Birth control/protection: Post-menopausal  Other Topics Concern   Not on file  Social History Narrative   Lives with husband [name] lives with them as do his 2 children   Daughter, [name], and her 3 children live in [place]   Son -  [name] is incarcerated   Nephew, Mayola Mcbain lives with them   Daughter, [name], in [place] - [name]   Moved June 2013 to a better home on [address]      Lives with husband, son, grandson, granddaughter, nephew.   Social Determinants of Health   Financial Resource Strain: Medium Risk   Difficulty of Paying Living Expenses: Somewhat hard  Food Insecurity: No Food Insecurity   Worried About Programme researcher, broadcasting/film/video in the Last Year: Never true   Ran Out of Food in the Last Year: Never true  Transportation Needs: No Transportation Needs   Lack of Transportation (Medical): No   Lack of Transportation (Non-Medical): No  Physical Activity: Not on file  Stress: Not on file  Social Connections: Not on file  Intimate Partner  Violence: Not on file    Physical Exam      Future Appointments  Date Time Provider Department Center  09/20/2021  7:00 AM [facility] DEVICE REMOTES [name]  09/24/2021  2:00 PM Allred, Fayrene Fearing, MD [name]  12/20/2021  7:00 AM [facility] DEVICE REMOTES [name]  03/21/2022  7:00 AM [facility] DEVICE REMOTES [name]  06/20/2022  7:00 AM [facility] DEVICE REMOTES [name]  09/19/2022  7:00 AM [facility] DEVICE REMOTES [name]  12/19/2022  7:00 AM [facility] DEVICE REMOTES [name]  03/20/2023  7:00 AM [facility] DEVICE REMOTES [name]  06/20/2023  7:05 AM [facility] DEVICE REMOTES [name]    BP 106/72   Pulse 63   Resp 18   Wt 172 lb (78 kg)   SpO2 97%   BMI 37.22 kg/m   Weight yesterday-172 Last visit weight-174   Pt reports she is  doing good,  She is now on bubble packs of meds. However they only gave her 2 wks worth of meds due to one of her meds needing refilled.  So she hasnt been able to take any meds the past several days.  I called pharmacy to see if her meds could be delivered out today.  I was able to piece a few days together-the pharmacist is not there today-the other girl there said she would get him to get her pill box together tomor and be sent out. I will f/u with pharmacist tomor.  Pt did not contact me (she lost my number, I saved it in her phone now) to let me know she was out of meds and pharmacy was suppose to refill what was needed and they didn't call me either.    Kerry Hough, EMT-Paramedic [phone] [facility] Paramedic  07/23/21

## 2021-07-25 ENCOUNTER — Other Ambulatory Visit (HOSPITAL_COMMUNITY): Payer: Self-pay | Admitting: *Deleted

## 2021-07-25 ENCOUNTER — Other Ambulatory Visit (HOSPITAL_COMMUNITY): Payer: Self-pay

## 2021-07-25 ENCOUNTER — Telehealth (HOSPITAL_COMMUNITY): Payer: Self-pay

## 2021-07-25 ENCOUNTER — Other Ambulatory Visit: Payer: Self-pay

## 2021-07-25 MED ORDER — SPIRONOLACTONE 25 MG PO TABS: 25.0000 mg | ORAL_TABLET | Freq: Every day | ORAL | 4 refills | Status: DC

## 2021-07-25 MED ORDER — VITAMIN D3 25 MCG (1000 UNIT) PO TABS: 1000.0000 [IU] | ORAL_TABLET | Freq: Every day | ORAL | 1 refills | Status: DC

## 2021-07-25 MED ORDER — LOSARTAN POTASSIUM 25 MG PO TABS: 12.5000 mg | ORAL_TABLET | Freq: Every morning | ORAL | 6 refills | Status: DC

## 2021-07-25 NOTE — Progress Notes (Signed)
Dropped off pts bubble pack for 1 week. Pharmacy needs refills on losartan, spiro and vitd. He isnt able to fill it due to [facility] and no refills left.  I asked appropriate provider to send in refills.  When I was there she showed up her left arm that got scraped up from her dogs yesterday and her rt hand-a spot on her rt hand looks infected. She had a bunch of bandaids on her arm and hand.  I removed those and re-bandaged her arm and hand. I advised her to contact her PCP to make an appointment asap and if she needs txp to let me know. Her legs are swollen too but meds were restarted on Monday. They are reddened but not hot to touch.  Not sure why the pharmacy didn't give her the meds last week.  She said the wound care charges her $45 co-pay  and she cant afford that each time.   I will f/u on this.   Kerry Hough, EMT-Paramedic  07/25/21

## 2021-07-25 NOTE — Telephone Encounter (Signed)
Contacted pharmacy to f/u on her bubble packs and he needs the rx for vit d, losartan, omeprazole.  I asked him to contact the [facility] first to pull those over and he will let me know if there are any issues with that.   Kerry Hough, EMT-Paramedic  07/25/21

## 2021-07-26 DIAGNOSIS — S52552D Other extraarticular fracture of lower end of left radius, subsequent encounter for closed fracture with routine healing: Secondary | ICD-10-CM | POA: Diagnosis not present

## 2021-07-31 ENCOUNTER — Other Ambulatory Visit (HOSPITAL_COMMUNITY): Payer: Self-pay

## 2021-07-31 NOTE — Progress Notes (Signed)
P/u 2 more wks of bubble packs for her. She is owed 1 more week-it was held due to change of tablet of potassium so he is waiting for rest of shipment to come in. I took pt the bubble packs.  She has PCP appointment this week for f/u on her legs, just speaking to her over the phone she said they are worse.  She has told me recently that she is unable to afford the co-pay at the wound care speciality clinic.  I did send message to a nurse there asking if more tx was needed that [facility] be referred out for wound care. She said she has a couple places on her legs that arent healing.  Will f/u.   Kerry Hough, EMT-Paramedic  07/31/21

## 2021-08-01 ENCOUNTER — Encounter (HOSPITAL_COMMUNITY): Payer: Self-pay | Admitting: Adult Health

## 2021-08-01 NOTE — Patient Instructions (Signed)
Thank you for coming to see me today. It was a pleasure.   I don't think your legs have any infection today.  I have ordered some cream for you to apply two times a day for 14 days.  Please apply a thin layer to each leg.    If you are in the sun, use sunscreen on your legs.  Recommend you continue to see the wound care nurse if you develop any skin sores or drainage Recommend you follow up with the foot doctor.   Please follow-up with PCP in 2 weeks or sooner if symptoms do not improve  If you have any questions or concerns, please do not hesitate to call the office at [phone].  Best,   Dana Allan, MD

## 2021-08-01 NOTE — Progress Notes (Signed)
HF Paramedicine Team Based Care Meeting  HF MD- NA  HF NP - [name] NP-C   [facility] HF Paramedicine  Misty Gray  [name] [facility] admit within the last 30 days for heart failure? No HF admit.   Medications concerns? Yes needs help with meds.   Transportation issues ? Yes only one car for the family   Education needs? Yes difficult educate on diet.   SDOH - Difficult with appointment due to transportation. Housing issues. Rental house.   Having difficulty with Co pays for leg wounds.   Eligible for discharge? No    [name] NP-C   3:49 PM

## 2021-08-01 NOTE — Progress Notes (Signed)
    SUBJECTIVE:   CHIEF COMPLAINT / HPI: lower extremity redness  Concerned that lower extremities may have infection.  Has has previous history of cellulitis and treated with Doxycyline, last treated 06/14.  Reports that legs are better than when seen in clinic at that time.  Denies any fevers, lower extremity pain, worsening swelling, weeping from skin, open sores, weakness, numbness, tingling or decrease in sensation.  Concerned about flaking skin and tried Vaseline but stopped as it ruins her bed sheets.  Was going to [facility] but stopped due to finances.  Last seen about 1-2 months ago.  She reports that she needs her toenails trimmed but again finances are limited and cannot get to Podiatry. Denies any chest pain, shortness of breath or difficulty breathing.   PERTINENT  PMH / PSH:  HFrEF HTN Chronic Venous Insufficiency Afib on Edoxaban SSS s/p Pacemaker Falls  OBJECTIVE:   BP 101/69   Pulse 94   Ht $R'4\' 9"'Ru$  (1.448 m)   Wt 160 lb 9.6 oz (72.8 kg)   SpO2 100%   BMI 34.75 kg/m    General: Alert, no acute distress Cardio: Controlled rate, irregular rhythm, no r/m/g Pulm: CTAB, normal work of breathing Abdomen: Bowel sounds normal. Abdomen soft and non-tender.  Extremities: No peripheral edema. Pedal and dorsalis pulses present bilaterally.  No calf swelling or pain      ASSESSMENT/PLAN:   Stasis dermatitis of both legs Chronic venous stasis.  No concern for infectious etiology given no fevers or open wounds.  Low suspicion for DVT given no calf pain, no lower extremity edema, pulses present, currently on Edoxaban daily and low risk Wells score.   -Trial Triamcinolone 0.1% with Cervae lotion 1:1 mixture -Recommend follow up with [facility] for UNNA boots, I'm not sure if this can be done weekly at [facility] clinic -Follow up with Podiatry and nail trimming.   -Would benefit from personal care assistance if not already established.  Can follow up with PCP -Follow up with PCP if any signs  of infection -Follow up with PCP for health care maintenance -Strict return precautions provided.  Onychomycosis Has not been to see Podiatry in a few months.  Nails have significant yellow discoloration.  Not painful or itching.  Bothersome with length.   Recommend she follow up with Podiatry for trimming    Referral sent to CCM for personal care services and possible financial assistance.  I have tried to reach out to Ms Roettger to discuss this but was unable to speak with her on the phone.  I think she may benefit from at least a conversation with CCM to see if her needs are being met at this time.  She was previously referred to [place] for housing concerns in 2017.   Carollee Leitz, MD [place]

## 2021-08-03 ENCOUNTER — Ambulatory Visit (INDEPENDENT_AMBULATORY_CARE_PROVIDER_SITE_OTHER): Payer: Medicare Other | Admitting: Family Medicine

## 2021-08-03 ENCOUNTER — Other Ambulatory Visit: Payer: Self-pay

## 2021-08-03 VITALS — BP 101/69 | HR 94 | Ht <= 58 in | Wt 160.6 lb

## 2021-08-03 DIAGNOSIS — I872 Venous insufficiency (chronic) (peripheral): Secondary | ICD-10-CM | POA: Diagnosis not present

## 2021-08-03 DIAGNOSIS — B351 Tinea unguium: Secondary | ICD-10-CM

## 2021-08-03 DIAGNOSIS — Z599 Problem related to housing and economic circumstances, unspecified: Secondary | ICD-10-CM

## 2021-08-03 MED ORDER — TRIAMCINOLONE ACETONIDE 0.1 % EX OINT: TOPICAL_OINTMENT | Freq: Two times a day (BID) | CUTANEOUS | 1 refills | Status: AC

## 2021-08-05 ENCOUNTER — Encounter: Payer: Self-pay | Admitting: Family Medicine

## 2021-08-05 DIAGNOSIS — R0902 Hypoxemia: Secondary | ICD-10-CM | POA: Diagnosis not present

## 2021-08-05 DIAGNOSIS — G4733 Obstructive sleep apnea (adult) (pediatric): Secondary | ICD-10-CM | POA: Diagnosis not present

## 2021-08-05 DIAGNOSIS — I872 Venous insufficiency (chronic) (peripheral): Secondary | ICD-10-CM | POA: Insufficient documentation

## 2021-08-05 NOTE — Assessment & Plan Note (Addendum)
Chronic venous stasis.  No concern for infectious etiology given no fevers or open wounds.  Low suspicion for DVT given no calf pain, no lower extremity edema, pulses present, currently on Edoxaban daily and low risk Wells score.   -Trial Triamcinolone 0.1% with Cervae lotion 1:1 mixture -Recommend follow up with [facility] for UNNA boots, I'm not sure if this can be done weekly at [facility] clinic -Follow up with Podiatry and nail trimming.   -Would benefit from personal care assistance if not already established.  Can follow up with PCP -Follow up with PCP if any signs of infection -Follow up with PCP for health care maintenance -Strict return precautions provided.

## 2021-08-05 NOTE — Assessment & Plan Note (Signed)
Has not been to see Podiatry in a few months.  Nails have significant yellow discoloration.  Not painful or itching.  Bothersome with length.   Recommend she follow up with Podiatry for trimming

## 2021-08-06 ENCOUNTER — Telehealth: Payer: Self-pay | Admitting: *Deleted

## 2021-08-06 NOTE — Chronic Care Management (AMB) (Signed)
  Care Management   Outreach Note  08/06/2021 Name: Misty Gray MRN: [id number] DOB: 1951-07-24  Referred by: Latrelle Dodrill, MD Reason for referral : Care Coordination (Initial outreach to schedule referral with [facility])   An unsuccessful telephone outreach was attempted today. The patient was referred to the case management team for assistance with care management and care coordination.   Follow Up Plan:  A HIPAA compliant phone message was left for the patient providing contact information and requesting a return call. The care management team will reach out to the patient again over the next 7 days.  If patient returns call to provider office, please advise to call Embedded Care Management Care Guide Misty Stanley at [phone].  Gwenevere Ghazi  Care Guide, Embedded Care Coordination [facility] Management  Direct Dial: [phone]

## 2021-08-13 NOTE — Chronic Care Management (AMB) (Signed)
   Care Management   Note  08/13/2021 Name: Misty Gray MRN: [id number] DOB: 30-Jul-1951  Misty Gray is a 70 y.o. year old female who is a primary care patient of Pollie Meyer Estevan Ryder, MD. I reached out to [name] by phone today in response to a referral sent by Misty Gray's PCP, Latrelle Dodrill, MD     Misty Gray was given information about care management services today including:  Care management services include personalized support from designated clinical staff supervised by her physician, including individualized plan of care and coordination with other care providers 24/7 contact phone numbers for assistance for urgent and routine care needs. The patient may stop care management services at any time by phone call to the office staff.  Patient agreed to services and verbal consent obtained.   Follow up plan: Telephone appointment with care management team member scheduled for:08/17/21  [facility] Guide, Embedded Care Coordination [facility] Health  Care Management  Direct Dial: [phone]

## 2021-08-14 ENCOUNTER — Ambulatory Visit: Payer: Self-pay | Admitting: Licensed Clinical Social Worker

## 2021-08-14 NOTE — Chronic Care Management (AMB) (Signed)
  Care Management  Consultation Note  08/14/2021 Name: Misty Gray MRN: [id number] DOB: December 18, 1950  Misty Gray is a 70 y.o. year old female who is a primary care patient of Pollie Meyer, Estevan Ryder, MD. The CCM team was consulted reference care coordination needs for Level of Care Concerns.  Assessment: Patient was not interviewed or contacted during this encounter.   Intervention:Conducted brief assessment, recommendations and relevant information discussed.  CCM LCSW collaborated with CCM RN, CCM care guide and LCSW from heart and vascular clinic .    Follow up Plan:  CCM RN has phone appointment with patient to assess for needs, will collaborate with CCM LCSW for ongoing needs.    Review of patient past medical history, allergies, medications, and health status, including review of pertinent consultant reports was performed as part of comprehensive evaluation and provision of care management/care coordination services.   Sammuel Hines, LCSW Care Management & Coordination  [facility]   [phone] 10:36 AM

## 2021-08-14 NOTE — Patient Instructions (Signed)
   Patient was not contacted during this encounter.  LCSW provided consultation to assist with unmet needs.  Josecarlos Harriott, LCSW Care Management & Coordination  [phone]  

## 2021-08-16 ENCOUNTER — Ambulatory Visit: Payer: Self-pay | Admitting: Licensed Clinical Social Worker

## 2021-08-16 NOTE — Chronic Care Management (AMB) (Signed)
  Care Management  Consultation Note  08/16/2021 Name: Misty Gray MRN: [id number] DOB: 1951-02-23  Misty Gray is a 70 y.o. year old female who is a primary care patient of Pollie Meyer Estevan Ryder, MD. The CCM team was consulted reference care coordination needs for [facility] , Level of Care Concerns, and Financial Difficulties.  Assessment: Patient was not interviewed or contacted during this encounter. Patient's is currently receiving support from Social work and home visits from EMT program at the [facility].   Intervention:Conducted brief assessment, recommendations and relevant information discussed.  CCM LCSW collaborated with CCM RN and Lasandra Beech, LCSW at  [facility] Clinic.    Follow up Plan:  CCM LCSW will not reach out or follow up with patient  [facility] social worker will assist patient with concerns related to social needs    Review of patient past medical history, allergies, medications, and health status, including review of pertinent consultant reports was performed as part of comprehensive evaluation and provision of care management/care coordination services.   Sammuel Hines, LCSW Care Management & Coordination  [facility] / [facility]   [phone] 9:29 AM

## 2021-08-16 NOTE — Patient Instructions (Signed)
   Patient was not contacted during this encounter.  LCSW provided consultation to assist with unmet needs.  Jacquelinne Speak, LCSW Care Management & Coordination  [phone]  

## 2021-08-17 ENCOUNTER — Telehealth: Payer: Self-pay

## 2021-08-17 ENCOUNTER — Telehealth: Payer: Medicare Other

## 2021-08-17 NOTE — Telephone Encounter (Signed)
   RN Case Manager Care Management   Phone Outreach    08/17/2021 Name: Misty Gray MRN: [id number] DOB: Dec 31, 1950  Misty Gray is a 70 y.o. year old female who is a primary care patient of Pollie Meyer Estevan Ryder, MD .   Telephone outreach was unsuccessful A HIPPA compliant phone message was left for the patient providing contact information and requesting a return call.   Plan:Will route chart to Care Guide to see if patient would like to reschedule phone appointment   Review of patient status, including review of consultants reports, relevant laboratory and other test results, and collaboration with appropriate care team members and the patient's provider was performed as part of comprehensive patient evaluation and provision of care management services.    [name] RN, BSN, [facility] Care Management Coordinator [facility] Phone: [phone] I Fax: [phone]

## 2021-08-21 ENCOUNTER — Telehealth (HOSPITAL_COMMUNITY): Payer: Self-pay

## 2021-08-21 ENCOUNTER — Telehealth: Payer: Self-pay | Admitting: *Deleted

## 2021-08-21 NOTE — Chronic Care Management (AMB) (Signed)
  Care Management   Note  08/21/2021 Name: Misty Gray MRN: [id number] DOB: 1951-08-03  Misty Gray is a 70 y.o. year old female who is a primary care patient of Latrelle Dodrill, MD and is actively engaged with the care management team. I reached out to [name] by phone today to assist with re-scheduling an initial visit with the RN Case Manager  Follow up plan: Unsuccessful telephone outreach attempt made. A HIPAA compliant phone message was left for the patient providing contact information and requesting a return call.  The care management team will reach out to the patient again over the next 7 days.  If patient returns call to provider office, please advise to call Embedded Care Management Care Guide Misty Stanley at [phone].  Gwenevere Ghazi  Care Guide, Embedded Care Coordination [facility] Management  Direct Dial: [phone]

## 2021-08-21 NOTE — Telephone Encounter (Signed)
Contacted pharmacy to order her bubble packs.  The ointment got reversed by the doctor-unsure why- Her bubble packs will be ready tomor.  Will p/u for her.   Kerry Hough, EMT-Paramedic  08/21/21

## 2021-08-22 ENCOUNTER — Other Ambulatory Visit (HOSPITAL_COMMUNITY): Payer: Self-pay

## 2021-08-22 ENCOUNTER — Other Ambulatory Visit: Payer: Self-pay | Admitting: Family Medicine

## 2021-08-22 ENCOUNTER — Telehealth: Payer: Self-pay | Admitting: *Deleted

## 2021-08-22 MED ORDER — TRIAMCINOLONE ACETONIDE 0.1 % EX CREA: 1.0000 "application " | TOPICAL_CREAM | Freq: Two times a day (BID) | CUTANEOUS | 1 refills | Status: DC

## 2021-08-22 NOTE — Telephone Encounter (Signed)
She just need to pick up medication.  I had put a refill on it as well.  Dana Allan, MD Family Medicine Residency

## 2021-08-22 NOTE — Chronic Care Management (AMB) (Signed)
  Care Management   Note  08/22/2021 Name: Misty Gray MRN: [id number] DOB: 07-05-1951  Misty Gray is a 70 y.o. year old female who is a primary care patient of Latrelle Dodrill, MD and is actively engaged with the care management team. I reached out to Misty Gray by phone today to assist with re-scheduling an initial visit with the RN Case Manager  Follow up plan: Telephone appointment with care management team member scheduled for:08/24/21  Misty Gray  Care Guide, Embedded Care Coordination [facility] Health  Care Management  Direct Dial: [phone]

## 2021-08-22 NOTE — Progress Notes (Signed)
I p/u pts bubble packs of meds along with her ointment that was sent back in today.  I spoke to [facility] and she was able to sch her PCP appoint for Friday and then sch her a call visit with her next Tuesday- I wrote those dates down and gave them to pt. She did confirm with her son that he was able to take her Friday and she said she would call to speak to [facility] on Tuesday.   Her leg did have a busted sore on it that had dried blood on it.  Not bleeding presently.   Kerry Hough, EMT-Paramedic  08/22/21

## 2021-08-22 NOTE — Telephone Encounter (Signed)
Patient never picked up medication for her legs, triamcinolone mixture. Will send to MD to please send back to pharmacy, she has someone that will be picking this up for her today.    Thanks [employer]

## 2021-08-23 DIAGNOSIS — Z4789 Encounter for other orthopedic aftercare: Secondary | ICD-10-CM | POA: Diagnosis not present

## 2021-08-23 DIAGNOSIS — S52552A Other extraarticular fracture of lower end of left radius, initial encounter for closed fracture: Secondary | ICD-10-CM | POA: Diagnosis not present

## 2021-08-24 ENCOUNTER — Other Ambulatory Visit: Payer: Self-pay

## 2021-08-24 ENCOUNTER — Encounter: Payer: Self-pay | Admitting: Family Medicine

## 2021-08-24 ENCOUNTER — Ambulatory Visit (INDEPENDENT_AMBULATORY_CARE_PROVIDER_SITE_OTHER): Payer: Medicare Other | Admitting: Family Medicine

## 2021-08-24 ENCOUNTER — Telehealth: Payer: Medicare Other

## 2021-08-24 DIAGNOSIS — L03116 Cellulitis of left lower limb: Secondary | ICD-10-CM | POA: Diagnosis not present

## 2021-08-24 MED ORDER — DOXYCYCLINE HYCLATE 100 MG PO TABS: 100.0000 mg | ORAL_TABLET | Freq: Two times a day (BID) | ORAL | 0 refills | Status: AC

## 2021-08-24 NOTE — Progress Notes (Signed)
     SUBJECTIVE:   CHIEF COMPLAINT / HPI:   Misty Gray is a 70 y.o. female presents for LE rash   LE blisters Pt is a poor historian. She reports that she thinks she has cellulitis. Last treated on 06/14. She has noticed blisters on both legs over the last few days which have "busted open". The blisters are painful. Right LE wound is bleeding weeping pus over the bedsheets. Denies fevers, rash, erythematous, worsening edema, chest pain, dyspnea. Pt is currently on Edoxaban. Has not been to wound care recently, last went there in May. She is unable to afford wound care at specialist clinic.   Flowsheet Row Office Visit from 08/24/2021 in [facility]  PHQ-9 Total Score 0        PERTINENT  PMH / PSH: CHF, HTN, OSA, seizure disorder   OBJECTIVE:   BP 109/71   Pulse 76   Ht 4\' 9"  (1.448 m)   Wt 167 lb 6.4 oz (75.9 kg)   SpO2 100%   BMI 36.23 kg/m    General: Alert, no acute distress, pleasant  Cardio: well perfused  Pulm:  normal work of breathing Neuro: Cranial nerves grossly intact      ASSESSMENT/PLAN:   Cellulitis New cellulitis of right LE 2/2 chronic venous stasis,  chronic edema and poor wound care. Pt has not followed up with wound care due cost of co-pay. New blistering wounds bilaterally. Doubt DVT as pt is on Edoxaban. Recommended 7 days Doxycycline 100mg  BID 7 days. Wrapped LEs in unna boots today. Follow up with me in 7 days. Return precautions provided to pt.    , MD PGY-3 [facility]

## 2021-08-24 NOTE — Patient Instructions (Addendum)
Thank you for coming to see me today. It was a pleasure. Today we discussed your legs, they look infected so I am prescribed doxycycline for 1 week. Please contact the pharmacy and maybe they can deliver to your house. I recommend coming back next week for a wound check.  If you develop fevers, wound gets painful, weeping more etc please go to the ER.   Please follow-up with me in 1 week  If you have any questions or concerns, please do not hesitate to call the office at [phone].  Best wishes,   Dr Allena Katz

## 2021-08-28 ENCOUNTER — Telehealth: Payer: Self-pay

## 2021-08-28 ENCOUNTER — Telehealth: Payer: Medicare Other

## 2021-08-28 NOTE — Assessment & Plan Note (Addendum)
New cellulitis of right LE 2/2 chronic venous stasis,  chronic edema and poor wound care. Pt has not followed up with wound care due cost of co-pay. New blistering wounds bilaterally. Doubt DVT as pt is on Edoxaban. Recommended 7 days Doxycycline 100mg  BID 7 days. Wrapped LEs in unna boots today. Follow up with me in 7 days. Return precautions provided to pt.

## 2021-08-28 NOTE — Telephone Encounter (Signed)
   RN Case Manager Care Management   Phone Outreach    08/28/2021 Name: Misty Gray MRN: [id number] DOB: [birth year]  Misty Gray is a 70 y.o. year old female who is a primary care patient of Pollie Meyer Estevan Ryder, MD .    2nd unsuccessful telephone outreach attempt.  If unable to reach patient by phone on the 3rd attempt, will discontinue outreach calls but will be available at any time to provide services. A HIPPA compliant phone message was left for the patient providing contact information and requesting a return call.  Plan:Will route chart to Care Guide to see if patient would like to reschedule phone appointment   Review of patient status, including review of consultants reports, relevant laboratory and other test results, and collaboration with appropriate care team members and the patient's provider was performed as part of comprehensive patient evaluation and provision of care management services.    [name] RN, BSN, [facility] Care Management Coordinator [facility] Phone: [phone] I Fax: [phone]

## 2021-08-29 ENCOUNTER — Ambulatory Visit: Payer: Medicare Other

## 2021-08-29 NOTE — Patient Instructions (Signed)
Ms. Reardon  it was nice speaking with you. Please call me directly [phone] if you have questions about the goals we discussed.   Goals Addressed               This Visit's Progress     My legs have been swllen for quite some time (pt-stated)        Timeframe:  Long-Range Goal Priority:  High Start Date:      08/29/21                       Expected End Date:   10/15/21                    Patient Goals/Self-Care Activities: Patient will self administer medications as prescribed Patient will attend all scheduled provider appointments Patient will call pharmacy for medication refills Patient will continue to perform ADL's independently Patient will continue to perform IADL's independently Patient will elevate her legs when sitting Patient/family will keep their hands clean if they have to medicate her legs with an ointment.           Patient Care Plan: RN Care Plan     Problem Identified: Impaied Tissue integrity      Long-Range Goal: The patiet will experience healing of her cellulits and decrease of ysmptoms   Start Date: 08/29/2021  Expected End Date: 10/15/2021  Priority: High  Note:   Current Barriers:  Impaired Tissue integrity r/t knowledge deficit about maintaining tissue integrity with the associated condition of chronic venous stasis-   RNCM Clinical Goal(s):  Patient will verbalize understanding of plan for management by describing a measure to protect and heal the tissue and wound healing through collaboration with RN Care manager, provider, and care team.   Interventions: 1:1 collaboration with primary care provider regarding development and update of comprehensive plan of care as evidenced by provider attestation and co-signature Inter-disciplinary care team collaboration (see longitudinal plan of care) Evaluation of current treatment plan related to  self management and patient's adherence to plan as established by provider   08/29/21  (Status: New  goal.) Evaluation of current treatment plan related to  Cellulitis of bilateral legs , self-management and patient's adherence to plan as established by provider. Discussed plans with patient for ongoing care management follow up and provided patient with direct contact information for care management team Provided education to patient re: Cellulitis, elevating her legs when not standing, cleaningher hands if she needs to tounch the area , making sure that she takes all druga that have been prescribed for her problem; Reviewed medications with patient and discussed  Collaborated with Kerry Hough EMT regarding medications Collaborated with staff [facility] to help with coordinating appointments and get ointment sent in I will send the patient a Calendar  and some information about Cellulitis Reviewed scheduled/upcoming provider appointments including 08/31/21 to for follow up on her legs; RNCM will send and Email to see if the patient is elgible to get another CPAP  Patient Goals/Self-Care Activities: Patient will self administer medications as prescribed Patient will attend all scheduled provider appointments Patient will call pharmacy for medication refills Patient will continue to perform ADL's independently Patient will continue to perform IADL's independently Patient will elevate her legs when sitting Patient/family will keep their hands clean if they have to medicate her legs with an ointment.        Ms. Appelhans received Care Management services today:  Care Management services include personalized  support from designated clinical staff supervised by her physician, including individualized plan of care and coordination with other care providers 24/7 contact [phone] for assistance for urgent and routine care needs. Care Management are voluntary services and be declined at any time by calling the office. The patient verbalizes understanding of the information and instructions discussed  today.  Our next appointment is scheduled for  09/14/21.   Please feel free to call me or the office if you have any questions or concerns.  [name] RN, BSN, [employer] Care Management Coordinator [facility] Phone: [phone] I Fax: [phone]

## 2021-08-29 NOTE — Chronic Care Management (AMB) (Addendum)
Chronic Care Management   CCM RN Visit Note  08/29/2021 Name: Misty Gray MRN: [id number] DOB: 07/27/1951  Subjective: Misty Gray is a 70 y.o. year old female who is a primary care patient of Misty Mems Delorse Limber, MD. The care management team was consulted for assistance with disease management and care coordination needs.    Engaged with patient by telephone for initial visit in response to provider referral for case management and/or care coordination services.   Consent to Services:  The patient was given the following information about Chronic Care Management services today, agreed to services, and gave verbal consent: 1. CCM service includes personalized support from designated clinical staff supervised by the primary care provider, including individualized plan of care and coordination with other care providers 2. 24/7 contact phone numbers for assistance for urgent and routine care needs. 3. Service will only be billed when office clinical staff spend 20 minutes or more in a month to coordinate care. 4. Only one practitioner may furnish and bill the service in a calendar month. 5.The patient may stop CCM services at any time (effective at the end of the month) by phone call to the office staff. 6. The patient will be responsible for cost sharing (co-pay) of up to 20% of the service fee (after annual deductible is met). Patient agreed to services and consent obtained.  Patient agreed to services and verbal consent obtained.    Assessment:  The patient  continues to experience difficulty with cellulitis in both legs.. See Care Plan below for interventions and patient self-care actives. Follow up Plan: Patient would like continued follow-up.  CCM RNCM will outreach the patient within the next 2 weeks.  Patient will call office if needed prior to next encounter  Review of patient past medical history, allergies, medications, health status, including review of consultants reports,  laboratory and other test data, was performed as part of comprehensive evaluation and provision of chronic care management services.   SDOH (Social Determinants of Health) assessments and interventions performed:  No  CCM Care Plan  Allergies  Allergen Reactions   Aspirin Hives and Rash   Penicillins Rash and Other (See Comments)    Has patient had a PCN reaction causing immediate rash, facial/tongue/throat swelling, SOB or lightheadedness with hypotension: Yes Has patient had a PCN reaction causing severe rash involving mucus membranes or skin necrosis: No Has patient had a PCN reaction that required hospitalization No Has patient had a PCN reaction occurring within the last 10 years: Yes If all of the above answers are "NO", then may proceed with Cephalosporin use.  Patient has tolerated cephalosporins in 2017.   Tape Other (See Comments)    NO PAPER TAPE-MUST BE MEDICAL TAPE - unknown reaction NO PAPER TAPE-MUST BE MEDICAL TAPE - unknown reaction   Wool Alcohol [Lanolin] Hives   Amoxicillin Rash    Patient has tolerated cephalosporins   Ampicillin Itching and Rash    Patient has tolerated cephalosporins    Outpatient Encounter Medications as of 08/29/2021  Medication Sig   amiodarone (PACERONE) 200 MG tablet Take 0.5 tablets (100 mg total) by mouth daily.   cholecalciferol (VITAMIN D) 25 MCG (1000 UNIT) tablet Take 1 tablet (1,000 Units total) by mouth daily.   doxycycline (VIBRA-TABS) 100 MG tablet Take 1 tablet (100 mg total) by mouth 2 (two) times daily for 7 days.   edoxaban (SAVAYSA) 60 MG TABS tablet Take 60 mg by mouth daily.   fluticasone (FLONASE) 50 MCG/ACT nasal  spray Place 2 sprays into both nostrils daily as needed. For congestion.   losartan (COZAAR) 25 MG tablet Take 0.5 tablets (12.5 mg total) by mouth every morning.   omeprazole (PRILOSEC) 20 MG capsule TAKE ONE CAPSULE BY MOUTH EVERY MORNING (Patient taking differently: Take 20 mg by mouth daily. TAKE ONE  CAPSULE BY MOUTH EVERY MORNING)   oxyCODONE-acetaminophen (PERCOCET) 5-325 MG tablet Take 1 tablet by mouth every 4 (four) hours as needed for severe pain.   PHENobarbital (LUMINAL) 64.8 MG tablet TAKE ONE TABLET BY MOUTH TWICE DAILY   potassium chloride (KLOR-CON) 10 MEQ tablet TAKE 4 TABLETS BY MOUTH THREE TIMES DAILY (MORNING, NOON, AND NIGHT)   spironolactone (ALDACTONE) 25 MG tablet Take 1 tablet (25 mg total) by mouth daily.   torsemide (DEMADEX) 20 MG tablet TAKE 4 TABLETS BY MOUTH EVERY MORNING and TAKE THREE TABLETS BY MOUTH EVERY EVENING   triamcinolone cream (KENALOG) 0.1 % Apply 1 application topically 2 (two) times daily.   No facility-administered encounter medications on file as of 08/29/2021.    Patient Active Problem List   Diagnosis Date Noted   Stasis dermatitis of both legs 08/05/2021   Hand injury, left, initial encounter 09/22/2020   CHF exacerbation ([place]) 07/12/2020   Hospital discharge follow-up 11/17/2019   Onychomycosis 11/17/2019   Abdominal wall hernia 11/05/2017   Anticoagulated 11/05/2017   Rash and nonspecific skin eruption 09/15/2017   Ventral hernia without obstruction or gangrene 08/15/2017   Chronic venous stasis dermatitis 12/26/2016   Urinary tract infection without hematuria    Frequent PVCs    Hypokalemia    Housing problems 11/23/2016   Chronic atrial fibrillation (HCC) 11/01/2016   Hypotension 10/31/2016   Healthcare maintenance 08/12/2016   Heme positive stool 08/09/2016   Intertrigo 08/09/2016   Chronic anticoagulation 04/26/2016   Chronic venous insufficiency    Cellulitis 09/07/2015   Breast mass, left 10/05/2014   Osteopenia 09/15/2014   Hypertension 41/28/7867   Chronic systolic dysfunction of left ventricle 05/05/2013   At high risk for falls 02/11/2013   Vitamin D deficiency 07/15/2012   Sinoatrial node dysfunction ([place]) 05/08/2011   Pacemaker 08/10/2009   Allergic rhinitis 05/31/2008   Morbid obesity ([place]) 02/12/2007    GASTROESOPHAGEAL REFLUX, NO ESOPHAGITIS 02/12/2007   Female stress incontinence 02/12/2007   Seizure disorder ([place]) 02/12/2007   OSA (obstructive sleep apnea) 02/12/2007    Conditions to be addressed/monitored: Cellulitis  Care Plan : RN Care Plan  Updates made by [name], RN since 08/29/2021 12:00 AM     Problem: Impaied Tissue integrity      Long-Range Goal: The patiet will experience healing of her cellulits and decrease of ysmptoms   Start Date: 08/29/2021  Expected End Date: 10/15/2021  Priority: High  Note:   Current Barriers:  Impaired Tissue integrity r/t knowledge deficit about maintaining tissue integrity with the associated condition of chronic venous stasis-   RNCM Clinical Goal(s):  Patient will verbalize understanding of plan for management by describing a measure to protect and heal the tissue and wound healing through collaboration with RN Care manager, provider, and care team.   Interventions: 1:1 collaboration with primary care provider regarding development and update of comprehensive plan of care as evidenced by provider attestation and co-signature Inter-disciplinary care team collaboration (see longitudinal plan of care) Evaluation of current treatment plan related to  self management and patient's adherence to plan as established by provider   08/29/21  (Status: New goal.) Evaluation of current treatment plan related  to  Cellulitis of bilateral legs , self-management and patient's adherence to plan as established by provider. Discussed plans with patient for ongoing care management follow up and provided patient with direct contact information for care management team Provided education to patient re: Cellulitis, elevating her legs when not standing, cleaningher hands if she needs to tounch the area , making sure that she takes all druga that have been prescribed for her problem; Reviewed medications with patient and discussed  Collaborated with [name] EMT regarding medications Collaborated with staff [facility] to help with coordinating appointments and get ointment sent in I will send the patient a Calendar  and some information about Cellulitis Reviewed scheduled/upcoming provider appointments including 08/31/21 to for follow up on her legs; RNCM will send and Email to see if the patient is elgible to get another CPAP  Patient Goals/Self-Care Activities: Patient will self administer medications as prescribed Patient will attend all scheduled provider appointments Patient will call pharmacy for medication refills Patient will continue to perform ADL's independently Patient will continue to perform IADL's independently Patient will elevate her legs when sitting Patient/family will keep their hands clean if they have to medicate her legs with an ointment.       [name] RN, BSN, [facility] Care Management Coordinator [place] Phone: [phone] I Fax: [phone]

## 2021-08-31 ENCOUNTER — Encounter: Payer: Self-pay | Admitting: Family Medicine

## 2021-08-31 ENCOUNTER — Ambulatory Visit (INDEPENDENT_AMBULATORY_CARE_PROVIDER_SITE_OTHER): Payer: Medicare Other | Admitting: Family Medicine

## 2021-08-31 ENCOUNTER — Other Ambulatory Visit: Payer: Self-pay

## 2021-08-31 DIAGNOSIS — L03116 Cellulitis of left lower limb: Secondary | ICD-10-CM

## 2021-08-31 NOTE — Assessment & Plan Note (Signed)
Removed unna boots. Precepted pt's wounds with Dr McDiarmid who examined the pt. Right LE wound healing well. Small wound on RE. Wrapped pt's legs in unna boots again. Follow up with me in 1 week. Due to pt's financial circumstances she is unable to afford co-pay for wound care and podiatry visits. She is also having trouble with transportation. It is most cost effective for pt to come into our clinic for regular monitoring of her LE wounds.

## 2021-08-31 NOTE — Progress Notes (Addendum)
     SUBJECTIVE:   CHIEF COMPLAINT / HPI:   Misty Gray is a 70 y.o. female presents for cellulitis follow up  Cellulitis Seen last week in clinic cellulitis. Started on Doxycyline and placed legs in unna boots. Pt completed abc course. Presents today for follow up. Denies fevers.  Flowsheet Row Office Visit from 08/31/2021 in [facility]  PHQ-9 Total Score 0       PERTINENT  PMH / PSH: Allergic rhinitis, OSA, chronic venous insufficiency, CHF   OBJECTIVE:   BP 101/86   Pulse 64   Ht 4\' 9"  (1.448 m)   Wt 166 lb 9.6 oz (75.6 kg)   SpO2 96%   BMI 36.05 kg/m    General: Alert, no acute distress Cardio: well perfused  Pulm: normal work of breathing.  Neuro: Cranial nerves grossly intact      ASSESSMENT/PLAN:   Cellulitis Removed unna boots. Precepted pt's wounds with Dr McDiarmid who examined the pt. Right LE wound healing well. Small wound on RE. Wrapped pt's legs in unna boots again. Follow up with me in 1 week. Due to pt's financial circumstances she is unable to afford co-pay for wound care and podiatry visits. She is also having trouble with transportation. It is most cost effective for pt to come into our clinic for regular monitoring of her LE wounds.     , MD PGY-3 [facility]

## 2021-08-31 NOTE — Patient Instructions (Signed)
Thank you for coming to see me today. It was a pleasure. Today we discussed your leg infection. The infection is healing well. I recommend unna boots again for the next week.  Please follow-up with me in 1 week  If you have any questions or concerns, please do not hesitate to call the office at [phone].  Best wishes,   Dr Allena Katz

## 2021-09-05 DIAGNOSIS — G4733 Obstructive sleep apnea (adult) (pediatric): Secondary | ICD-10-CM | POA: Diagnosis not present

## 2021-09-05 DIAGNOSIS — R0902 Hypoxemia: Secondary | ICD-10-CM | POA: Diagnosis not present

## 2021-09-07 ENCOUNTER — Ambulatory Visit (INDEPENDENT_AMBULATORY_CARE_PROVIDER_SITE_OTHER): Payer: Medicare Other | Admitting: Family Medicine

## 2021-09-07 ENCOUNTER — Encounter: Payer: Self-pay | Admitting: Family Medicine

## 2021-09-07 ENCOUNTER — Other Ambulatory Visit: Payer: Self-pay

## 2021-09-07 VITALS — BP 113/80 | HR 87 | Ht <= 58 in | Wt 167.8 lb

## 2021-09-07 DIAGNOSIS — Z0000 Encounter for general adult medical examination without abnormal findings: Secondary | ICD-10-CM | POA: Diagnosis not present

## 2021-09-07 DIAGNOSIS — Z1211 Encounter for screening for malignant neoplasm of colon: Secondary | ICD-10-CM | POA: Diagnosis not present

## 2021-09-07 DIAGNOSIS — L03116 Cellulitis of left lower limb: Secondary | ICD-10-CM | POA: Diagnosis not present

## 2021-09-07 NOTE — Assessment & Plan Note (Signed)
Discussed mammography, flu, shingles, covid vaccines. Pt declined all of them. Followed up on cologuard. This was sent to her in April of this year. She denies having the correct equipment to do this.

## 2021-09-07 NOTE — Patient Instructions (Signed)
Thank you for coming to see me today. It was a pleasure. Today we discussed your leg infection, it has now resolved. Come back and see [place] if you noticed any more blisters or wounds.   I have resent a cologuard to you for colon cancer screening   Please follow-up with your PCP as needed.   If you have any questions or concerns, please do not hesitate to call the office at [phone].  Best wishes,   Dr Allena Katz

## 2021-09-07 NOTE — Assessment & Plan Note (Signed)
Resolved. S/p unna boots for the last 2 weeks. Pt will follow up in the clinic if she has any further wounds/signs of cellulitis as she cannot afford co-pay for wound care. Pt expressed understanding.

## 2021-09-07 NOTE — Progress Notes (Signed)
     SUBJECTIVE:   CHIEF COMPLAINT / HPI:   Misty Gray is a 70 y.o. female presents for follow up  Pt presents for cellulitis follow up. Pt has kept the unna boots on for the last week. Denies new blisters/pain/rashes of lower extremities.   Flowsheet Row Office Visit from 09/07/2021 in [facility]  PHQ-9 Total Score 0       PERTINENT  PMH / PSH: seizure disorder, chronic venous insufficiency, OSA, CHF  OBJECTIVE:   BP 113/80   Pulse 87   Ht 4\' 9"  (1.448 m)   Wt 167 lb 12.8 oz (76.1 kg)   BMI 36.31 kg/m    General: Alert, no acute distress     ASSESSMENT/PLAN:   Healthcare maintenance Discussed mammography, flu, shingles, covid vaccines. Pt declined all of them. Followed up on cologuard. This was sent to her in April of this year. She denies having the correct equipment to do this.   Cellulitis Resolved. S/p unna boots for the last 2 weeks. Pt will follow up in the clinic if she has any further wounds/signs of cellulitis as she cannot afford co-pay for wound care. Pt expressed understanding.     [place], MD PGY-3 [facility]

## 2021-09-14 ENCOUNTER — Ambulatory Visit: Payer: Medicare Other

## 2021-09-14 NOTE — Patient Instructions (Signed)
Visit Information  Misty Gray  it was nice speaking with you. Please call me directly [phone] if you have questions about the goals we discussed.  Timeframe:  Long-Range Goal Priority:  High Start Date:      08/29/21                       Expected End Date:   10/15/21                    Patient Goals/Self-Care Activities: Patient will self administer medications as prescribed Patient will attend all scheduled provider appointments Patient will call pharmacy for medication refills Patient will continue to perform ADL's independently Patient will continue to perform IADL's independently Patient will elevate her legs when sitting Patient/family will keep their hands clean to wash and medicate her legs with an ointment.   The patient verbalizes understanding of the information and instructions discussed today.  Our next appointment is scheduled for  10/12/21.   Please feel free to call me or the office if you have any questions or concerns.  [name] RN, BSN, [facility] Care Management Coordinator [facility] Phone: [phone] I Fax: [phone]

## 2021-09-14 NOTE — Chronic Care Management (AMB) (Signed)
Chronic Care Management   CCM RN Visit Note  09/14/2021 Name: RAFEEF Gray MRN: [id number] DOB: 23-Jun-1951  Subjective: Misty Gray is a 70 y.o. year old female who is a primary care patient of Pollie Meyer Estevan Ryder, MD. The care management team was consulted for assistance with disease management and care coordination needs.    Engaged with patient by telephone for follow up visit in response to provider referral for case management and/or care coordination services.   Consent to Services:  The patient was given information about Chronic Care Management services, agreed to services, and gave verbal consent prior to initiation of services.  Please see initial visit note for detailed documentation.   Patient agreed to services and verbal consent obtained.   Assessment: Review of patient past medical history, allergies, medications, health status, including review of consultants reports, laboratory and other test data, was performed as part of comprehensive evaluation and provision of chronic care management services.   SDOH (Social Determinants of Health) assessments and interventions performed:    CCM Care Plan  Allergies  Allergen Reactions   Aspirin Hives and Rash   Penicillins Rash and Other (See Comments)    Has patient had a PCN reaction causing immediate rash, facial/tongue/throat swelling, SOB or lightheadedness with hypotension: Yes Has patient had a PCN reaction causing severe rash involving mucus membranes or skin necrosis: No Has patient had a PCN reaction that required hospitalization No Has patient had a PCN reaction occurring within the last 10 years: Yes If all of the above answers are "NO", then may proceed with Cephalosporin use.  Patient has tolerated cephalosporins in 2017.   Tape Other (See Comments)    NO PAPER TAPE-MUST BE MEDICAL TAPE - unknown reaction NO PAPER TAPE-MUST BE MEDICAL TAPE - unknown reaction   Wool Alcohol [Lanolin] Hives   Amoxicillin Rash     Patient has tolerated cephalosporins   Ampicillin Itching and Rash    Patient has tolerated cephalosporins    Outpatient Encounter Medications as of 09/14/2021  Medication Sig   amiodarone (PACERONE) 200 MG tablet Take 0.5 tablets (100 mg total) by mouth daily.   cholecalciferol (VITAMIN D) 25 MCG (1000 UNIT) tablet Take 1 tablet (1,000 Units total) by mouth daily.   edoxaban (SAVAYSA) 60 MG TABS tablet Take 60 mg by mouth daily.   fluticasone (FLONASE) 50 MCG/ACT nasal spray Place 2 sprays into both nostrils daily as needed. For congestion.   losartan (COZAAR) 25 MG tablet Take 0.5 tablets (12.5 mg total) by mouth every morning.   omeprazole (PRILOSEC) 20 MG capsule TAKE ONE CAPSULE BY MOUTH EVERY MORNING (Patient taking differently: Take 20 mg by mouth daily. TAKE ONE CAPSULE BY MOUTH EVERY MORNING)   oxyCODONE-acetaminophen (PERCOCET) 5-325 MG tablet Take 1 tablet by mouth every 4 (four) hours as needed for severe pain.   PHENobarbital (LUMINAL) 64.8 MG tablet TAKE ONE TABLET BY MOUTH TWICE DAILY   potassium chloride (KLOR-CON) 10 MEQ tablet TAKE 4 TABLETS BY MOUTH THREE TIMES DAILY (MORNING, NOON, AND NIGHT)   spironolactone (ALDACTONE) 25 MG tablet Take 1 tablet (25 mg total) by mouth daily.   torsemide (DEMADEX) 20 MG tablet TAKE 4 TABLETS BY MOUTH EVERY MORNING and TAKE THREE TABLETS BY MOUTH EVERY EVENING   triamcinolone cream (KENALOG) 0.1 % Apply 1 application topically 2 (two) times daily.   No facility-administered encounter medications on file as of 09/14/2021.    Patient Active Problem List   Diagnosis Date Noted   Stasis  dermatitis of both legs 08/05/2021   Hand injury, left, initial encounter 09/22/2020   CHF exacerbation (HCC) 07/12/2020   Hospital discharge follow-up 11/17/2019   Onychomycosis 11/17/2019   Abdominal wall hernia 11/05/2017   Anticoagulated 11/05/2017   Rash and nonspecific skin eruption 09/15/2017   Ventral hernia without obstruction or gangrene  08/15/2017   Chronic venous stasis dermatitis 12/26/2016   Urinary tract infection without hematuria    Frequent PVCs    Hypokalemia    Housing problems 11/23/2016   Chronic atrial fibrillation (HCC) 11/01/2016   Hypotension 10/31/2016   Healthcare maintenance 08/12/2016   Heme positive stool 08/09/2016   Intertrigo 08/09/2016   Chronic anticoagulation 04/26/2016   Chronic venous insufficiency    Cellulitis 09/07/2015   Breast mass, left 10/05/2014   Osteopenia 09/15/2014   Hypertension 01/04/2014   Chronic systolic dysfunction of left ventricle 05/05/2013   At high risk for falls 02/11/2013   Vitamin D deficiency 07/15/2012   Sinoatrial node dysfunction (HCC) 05/08/2011   Pacemaker 08/10/2009   Allergic rhinitis 05/31/2008   Morbid obesity (HCC) 02/12/2007   GASTROESOPHAGEAL REFLUX, NO ESOPHAGITIS 02/12/2007   Female stress incontinence 02/12/2007   Seizure disorder (HCC) 02/12/2007   OSA (obstructive sleep apnea) 02/12/2007    Conditions to be addressed/monitored: Cellulitis  Care Plan : RN Care Plan  Updates made by [name], RN since 09/14/2021 12:00 AM     Problem: Impaied Tissue integrity      Long-Range Goal: The patiet will experience healing of her cellulits and decrease of ysmptoms   Start Date: 08/29/2021  Expected End Date: 10/15/2021  Priority: High  Note:   Current Barriers:  Impaired Tissue integrity r/t knowledge deficit about maintaining tissue integrity with the associated condition of chronic venous stasis-   RNCM Clinical Goal(s):  Patient will verbalize understanding of plan for management by describing a measure to protect and heal the tissue and wound healing through collaboration with RN Care manager, provider, and care team.   Interventions: 1:1 collaboration with primary care provider regarding development and update of comprehensive plan of care as evidenced by provider attestation and co-signature Inter-disciplinary care team  collaboration (see longitudinal plan of care) Evaluation of current treatment plan related to  self management and patient's adherence to plan as established by provider   08/29/21  (Status: New goal.) Evaluation of current treatment plan related to  Cellulitis of bilateral legs , self-management and patient's adherence to plan as established by provider. Discussed plans with patient for ongoing care management follow up and provided patient with direct contact information for care management team Provided education to patient re: Cellulitis, elevating her legs when not standing, cleaningher hands if she needs to tounch the area , making sure that she takes all druga that have been prescribed for her problem; Reviewed medications with patient and discussed  Collaborated with Kerry Hough EMT regarding medications Collaborated with staff [facility] to help with coordinating appointments and get ointment sent in I will send the patient a Calendar  and some information about Cellulitis Reviewed scheduled/upcoming provider appointments including 08/31/21 to for follow up on her legs;Patient went to her appointment on 08/31/21 and 09/07/21  09/14/21:  I have spoken with  Mrs Skates, who says her legs are doing great. She is washing them dry with a clean white towel and rubbing them down with the ointmenet as prescribed for her. On her 09/07/21 visit, it was felt that her leg infection had resolved, but she was told to contact  the office if she has any problems in the future. Mrs. Mohrmann did receive the information that was sent from [facility]. She said that she is taking care of herself by taking her medications, eating well, staying hydrated, and having sleeping well.  I encouraged her to contiue her routine.   Patient Goals/Self-Care Activities: Patient will self administer medications as prescribed Patient will attend all scheduled provider appointments Patient will call pharmacy for medication refills Patient will  continue to perform ADL's independently Patient will continue to perform IADL's independently Patient will elevate her legs when sitting Patient/family will keep their hands clean to wash and medicate her legs with an ointment.       [name] RN, BSN, [facility] Care Management Coordinator [facility] Phone: [phone] I Fax: [phone]

## 2021-09-17 ENCOUNTER — Encounter: Payer: Medicare Other | Admitting: Internal Medicine

## 2021-09-19 ENCOUNTER — Telehealth (HOSPITAL_COMMUNITY): Payer: Self-pay

## 2021-09-19 ENCOUNTER — Ambulatory Visit: Payer: Self-pay

## 2021-09-19 NOTE — Telephone Encounter (Signed)
[facility] to [facility] her bubble packs.   Kerry Hough, EMT-Paramedic  09/19/21

## 2021-09-19 NOTE — Telephone Encounter (Signed)
This encounter was created in error - please disregard.

## 2021-09-19 NOTE — Chronic Care Management (AMB) (Signed)
   RN Case Manager Care Management   Phone Outreach    09/19/2021 Name: ISAURA SCHILLER MRN: [id number] DOB: 1951-03-04  [name] is a 70 y.o. year old female who is a primary care patient of Pollie Meyer, Estevan Ryder, MD .   CCM RNCM spoke with Kerry Hough EMS informing her about the information I received from [employer] regarding Mrs. Sharps's CPAP Machine. She was told she needs Office Visit notes that support her use and need from CPAP, a copy of the sleep study, and a prescription. I also sent [name] a copy of the [employer] financial application for the patient via Email.  Follow Up Plan: RNCM will follow up with the patient at the next scheduled Interval.   Review of patient status, including review of consultants reports, relevant laboratory and other test results, and collaboration with appropriate care team members and the patient's provider was performed as part of comprehensive patient evaluation and provision of care management services.    [name] RN, BSN, [facility] Care Management Coordinator [facility] Phone: [phone] I Fax: [phone]

## 2021-09-21 LAB — COLOGUARD

## 2021-09-22 ENCOUNTER — Other Ambulatory Visit (HOSPITAL_COMMUNITY): Payer: Self-pay | Admitting: Internal Medicine

## 2021-09-22 ENCOUNTER — Other Ambulatory Visit: Payer: Self-pay | Admitting: Family Medicine

## 2021-09-24 ENCOUNTER — Ambulatory Visit (INDEPENDENT_AMBULATORY_CARE_PROVIDER_SITE_OTHER): Payer: Medicare Other | Admitting: Internal Medicine

## 2021-09-24 ENCOUNTER — Other Ambulatory Visit: Payer: Self-pay

## 2021-09-24 ENCOUNTER — Telehealth: Payer: Self-pay

## 2021-09-24 VITALS — BP 126/80 | HR 107 | Ht <= 58 in | Wt 165.0 lb

## 2021-09-24 DIAGNOSIS — I495 Sick sinus syndrome: Secondary | ICD-10-CM | POA: Diagnosis not present

## 2021-09-24 DIAGNOSIS — I5022 Chronic systolic (congestive) heart failure: Secondary | ICD-10-CM | POA: Diagnosis not present

## 2021-09-24 DIAGNOSIS — R001 Bradycardia, unspecified: Secondary | ICD-10-CM | POA: Diagnosis not present

## 2021-09-24 DIAGNOSIS — I4819 Other persistent atrial fibrillation: Secondary | ICD-10-CM | POA: Diagnosis not present

## 2021-09-24 DIAGNOSIS — G4733 Obstructive sleep apnea (adult) (pediatric): Secondary | ICD-10-CM

## 2021-09-24 NOTE — Progress Notes (Signed)
PCP: Latrelle Dodrill, MD Primary Cardiologist: Dr Gala Romney Primary EP:  Dr Johney Frame  Misty Gray is a 70 y.o. female who presents today for routine electrophysiology followup.  Since her pacemaker generator change, the patient reports doing reasonably well.  SOB and edema are stable.  She continues to have issues with venous insufficiency/ stasis changes.  She is on home O2. Today, she denies symptoms of palpitations, chest pain, dizziness, presyncope, or syncope.  The patient is otherwise without complaint today.   Past Medical History:  Diagnosis Date   Abnormal CT of the head 12/16/1984   R parietal atrophy   Allergic rhinitis, cause unspecified    Altered mental status 11/28/2016   Anemia    Arthritis    "hands and knees" (09/06/2015)   Atrial fibrillation (HCC)    Cellulitis 09/07/2015   Cellulitis and abscess of leg 09/2016   bilateral   CHF (congestive heart failure) (HCC)    Diaphragmatic hernia without mention of obstruction or gangrene    Dyspnea    Exertional dyspnea 06/22/12   Family history of adverse reaction to anesthesia    "daughter fights w/them; just can't relax during OR; stay awake during the OR"   Fatigue 06/22/2012   Female stress incontinence    GERD (gastroesophageal reflux disease)    Grand mal seizure (HCC)    H/O hiatal hernia    two removed   Heart murmur    High cholesterol    History of blood transfusion 1956   "related to nose bleed"   Hypertension    Influenza A 01/11/2014   Lichenification and lichen simplex chronicus    Migraine    "when I was a teenager"   Morbid obesity (HCC)    Nonischemic cardiomyopathy (HCC)    EF has normalized-repeat Pending    On home oxygen therapy    "2L when I'm asleep in bed" (09/06/2015)   OSA on CPAP    Pacemaker  st judes    Patient states "this is my third pacemaker"   Pneumonia 2004; 2011; 11/2011   Seizures (HCC) since 1981   "can hear you talking but sounds like you are in big tunnel; have them  often if not taking RX; last one was 06/17/12" (09/06/2015)   Shortness of breath 10/24/2010   Qualifier: Diagnosis of  By: [place], Bonnie     Sick sinus syndrome (HCC)    WITH PRIOR DDD PACEMAKER IMPLANTATION   Skin irritation 07/23/2017   Somnolence    Stroke (HCC) 1988   "mouth drawed real bad on my left side; found out it was a seizure"   Syncope and collapse 06/22/12   "hit forehead and left knee"; denies loss of consciousness   Unspecified venous (peripheral) insufficiency    Past Surgical History:  Procedure Laterality Date   APPENDECTOMY     CARDIAC CATHETERIZATION N/A 10/03/2016   Procedure: Right/Left Heart Cath and Coronary Angiography;  Surgeon: Dolores Patty, MD;  Location[facility] INVASIVE CV LAB;  Service: Cardiovascular;  Laterality: N/A;   HERNIA REPAIR  ? 1988; 04/15/1998   INSERT / REPLACE / REMOVE PACEMAKER  12/16/1990   DDD EF 30%;   INSERT / REPLACE / REMOVE PACEMAKER  07/25/2003   replaced by BB, leads are both IS1 and from 1992   INSERT / REPLACE / REMOVE PACEMAKER  05/21/2011   JA; generator change   LEG SKIN LESION  BIOPSY / EXCISION  05/16/2001   Lichen Planus   ORIF  RADIAL FRACTURE Left 06/01/2021   Procedure: OPEN REDUCTION INTERNAL FIXATION (ORIF) RADIAL FRACTURE;  Surgeon: Dairl Ponder, MD;  Location: [facility] OR;  Service: Orthopedics;  Laterality: Left;  AXILLIARY BLOCK   PPM GENERATOR CHANGEOUT N/A 06/21/2021   Procedure: PPM GENERATOR CHANGEOUT;  Surgeon: [name], MD;  Location: [facility] INVASIVE CV LAB;  Service: Cardiovascular;  Laterality: N/A;   VENTRAL HERNIA REPAIR  1989; 04/15/1998    ROS- all systems are reviewed and negative except as per HPI above  Current Outpatient Medications  Medication Sig Dispense Refill   amiodarone (PACERONE) 200 MG tablet Take 0.5 tablets (100 mg total) by mouth daily. 45 tablet 3   D3-1000 25 MCG (1000 UT) tablet TAKE 1 TABLET BY MOUTH DAILY (AM) 28 tablet 1   edoxaban (SAVAYSA) 60 MG TABS tablet Take 60 mg by mouth daily. 30  tablet 11   fluticasone (FLONASE) 50 MCG/ACT nasal spray Place 2 sprays into both nostrils daily as needed. For congestion. 16 g 1   HYDROcodone-acetaminophen (NORCO/VICODIN) 5-325 MG tablet Take 1 tablet by mouth every 6 (six) hours as needed for pain.     losartan (COZAAR) 25 MG tablet Take 0.5 tablets (12.5 mg total) by mouth every morning. 15 tablet 6   omeprazole (PRILOSEC) 20 MG capsule Take 1 capsule (20 mg total) by mouth daily. TAKE ONE CAPSULE BY MOUTH EVERY MORNING 30 capsule 1   PHENobarbital (LUMINAL) 64.8 MG tablet TAKE ONE TABLET BY MOUTH TWICE DAILY (AM+BEDTIME) 56 tablet 0   potassium chloride (KLOR-CON) 10 MEQ tablet TAKE 4 TABLETS BY MOUTH THREE TIMES DAILY (MORNING, NOON, AND NIGHT) 360 tablet 6   spironolactone (ALDACTONE) 25 MG tablet Take 1 tablet (25 mg total) by mouth daily. 30 tablet 4   torsemide (DEMADEX) 20 MG tablet TAKE 4 TABLETS BY MOUTH EVERY MORNING and TAKE THREE TABLETS BY MOUTH EVERY EVENING 210 tablet 5   triamcinolone cream (KENALOG) 0.1 % Apply 1 application topically 2 (two) times daily. 30 g 1   No current facility-administered medications for this visit.    Physical Exam: Vitals:   09/24/21 1419  BP: 126/80  Pulse: (!) 107  SpO2: (!) 89%  Weight: 165 lb (74.8 kg)  Height: 4\' 9"  (1.448 m)    GEN- The patient is overweight and chronically ill appearing, alert and oriented x 3 today.   Head- normocephalic, atraumatic Eyes-  Sclera clear, conjunctiva pink Ears- hearing intact Oropharynx- clear Lungs- Clear to ausculation bilaterally, normal work of breathing Chest- pacemaker pocket is well healed Heart- irregular rate and rhythm  GI- soft, NT, ND, + BS Extremities- no clubbing, cyanosis, + venous stasis changes with edema and weaping wounds chronically  Pacemaker interrogation- reviewed in detail today,  See PACEART report  ekg tracing ordered today is personally reviewed and shows afib, V rate 107 bpm, LAD, IVCD  Assessment and  Plan:  1. Symptomatic sinus bradycardia  Normal pacemaker function See Pace Art report No changes today she is not device dependant today V paces only 2.8%  2. Longstanding persistent atrial fibrillation She has severe LA She is on savaysa Histograms reveal relatively good V rate control.  V rates was elevated upon first getting into our clinic today by ekg.  While waiting to seeme, her V rates came down nicely.  3. Chronic systolic dysfunction EF 25-30% V paces only 2.8%, QRS <130 msec Not an ICD or BiV candidate No significant PVCs by exam or ekg today  4. OSA Uses CPAP  5. PVCs  On amio per Dr Gala Romney  Risks, benefits and potential toxicities for medications prescribed and/or refilled reviewed with patient today.   Return to see EP APP in a year  [name] MD, [facility] 09/24/2021 2:27 PM

## 2021-09-24 NOTE — Patient Instructions (Signed)
Medication Instructions:  Your physician recommends that you continue on your current medications as directed. Please refer to the Current Medication list given to you today. *If you need a refill on your cardiac medications before your next appointment, please call your pharmacy*  Lab Work: None. If you have labs (blood work) drawn today and your tests are completely normal, you will receive your results only by: MyChart Message (if you have MyChart) OR A paper copy in the mail If you have any lab test that is abnormal or we need to change your treatment, we will call you to review the results.  Testing/Procedures: None.  Follow-Up: At [facility], you and your health needs are our priority.  As part of our continuing mission to provide you with exceptional heart care, we have created designated Provider Care Teams.  These Care Teams include your primary Cardiologist (physician) and Advanced Practice Providers (APPs -  Physician Assistants and Nurse Practitioners) who all work together to provide you with the care you need, when you need it.  Your physician wants you to follow-up in: 12 months with  [name], MD or one of the following Advanced Practice Providers on your designated Care Team:    Francis Dowse, [place] Casimiro Needle "Mardelle Matte" [place], [place]   You will receive a reminder letter in the mail two months in advance. If you don't receive a letter, please call our office to schedule the follow-up appointment.  Remote monitoring is used to monitor your Pacemaker from home. This monitoring reduces the number of office visits required to check your device to one time per year. It allows [place] to keep an eye on the functioning of your device to ensure it is working properly. You are scheduled for a device check from home on 12/20/20. You may send your transmission at any time that day. If you have a wireless device, the transmission will be sent automatically. After your physician reviews your  transmission, you will receive a postcard with your next transmission date.  We recommend signing up for the patient portal called "MyChart".  Sign up information is provided on this After Visit Summary.  MyChart is used to connect with patients for Virtual Visits (Telemedicine).  Patients are able to view lab/test results, encounter notes, upcoming appointments, etc.  Non-urgent messages can be sent to your provider as well.   To learn more about what you can do with MyChart, go to [email or web address].    Any Other Special Instructions Will Be Listed Below (If Applicable).

## 2021-09-24 NOTE — Telephone Encounter (Signed)
Attempted to reach patient to discuss Remote monitoring.  Patient does not appear to have remote transmitter in [place], if she does have [id number] monitor, we need the serial number to update [email or web address].    No answer, LVM requesting callback or bring monitor to appt today.

## 2021-09-26 ENCOUNTER — Telehealth (HOSPITAL_COMMUNITY): Payer: Self-pay

## 2021-09-26 NOTE — Telephone Encounter (Signed)
I called pt to check in to ensure her bubble packs were delivered and they were.  She reports that her husband is very sick and they are awaiting a hosp bed.  She reports that she thinks her cellulitis is back, I advised her to contact her PCP back and let them know.  She will do that but just very stressed right now with her husband being sick and her son and grand-daughter sick too.    Kerry Hough, EMT-Paramedic  09/26/21

## 2021-10-02 ENCOUNTER — Inpatient Hospital Stay (HOSPITAL_COMMUNITY)
Admission: EM | Admit: 2021-10-02 | Discharge: 2021-10-05 | DRG: 291 | Disposition: A | Discharge status: Home Health | Payer: Medicare Other | Attending: Family Medicine | Admitting: Family Medicine

## 2021-10-02 ENCOUNTER — Emergency Department (HOSPITAL_COMMUNITY): Payer: Medicare Other

## 2021-10-02 ENCOUNTER — Encounter (HOSPITAL_COMMUNITY): Payer: Self-pay | Admitting: Pharmacy Technician

## 2021-10-02 DIAGNOSIS — Z9109 Other allergy status, other than to drugs and biological substances: Secondary | ICD-10-CM

## 2021-10-02 DIAGNOSIS — Z950 Presence of cardiac pacemaker: Secondary | ICD-10-CM

## 2021-10-02 DIAGNOSIS — I5023 Acute on chronic systolic (congestive) heart failure: Secondary | ICD-10-CM | POA: Diagnosis present

## 2021-10-02 DIAGNOSIS — I5021 Acute systolic (congestive) heart failure: Secondary | ICD-10-CM | POA: Diagnosis not present

## 2021-10-02 DIAGNOSIS — G4733 Obstructive sleep apnea (adult) (pediatric): Secondary | ICD-10-CM | POA: Diagnosis present

## 2021-10-02 DIAGNOSIS — L308 Other specified dermatitis: Secondary | ICD-10-CM | POA: Diagnosis present

## 2021-10-02 DIAGNOSIS — Z20822 Contact with and (suspected) exposure to covid-19: Secondary | ICD-10-CM | POA: Diagnosis present

## 2021-10-02 DIAGNOSIS — R0902 Hypoxemia: Secondary | ICD-10-CM | POA: Diagnosis present

## 2021-10-02 DIAGNOSIS — I428 Other cardiomyopathies: Secondary | ICD-10-CM | POA: Diagnosis not present

## 2021-10-02 DIAGNOSIS — Z9981 Dependence on supplemental oxygen: Secondary | ICD-10-CM

## 2021-10-02 DIAGNOSIS — Z886 Allergy status to analgesic agent status: Secondary | ICD-10-CM

## 2021-10-02 DIAGNOSIS — I509 Heart failure, unspecified: Secondary | ICD-10-CM | POA: Diagnosis not present

## 2021-10-02 DIAGNOSIS — E669 Obesity, unspecified: Secondary | ICD-10-CM | POA: Diagnosis present

## 2021-10-02 DIAGNOSIS — Z881 Allergy status to other antibiotic agents status: Secondary | ICD-10-CM

## 2021-10-02 DIAGNOSIS — I110 Hypertensive heart disease with heart failure: Principal | ICD-10-CM | POA: Diagnosis present

## 2021-10-02 DIAGNOSIS — R0602 Shortness of breath: Secondary | ICD-10-CM | POA: Diagnosis not present

## 2021-10-02 DIAGNOSIS — E876 Hypokalemia: Secondary | ICD-10-CM

## 2021-10-02 DIAGNOSIS — I482 Chronic atrial fibrillation, unspecified: Secondary | ICD-10-CM | POA: Diagnosis present

## 2021-10-02 DIAGNOSIS — I495 Sick sinus syndrome: Secondary | ICD-10-CM | POA: Diagnosis not present

## 2021-10-02 DIAGNOSIS — I472 Ventricular tachycardia, unspecified: Secondary | ICD-10-CM | POA: Diagnosis not present

## 2021-10-02 DIAGNOSIS — E785 Hyperlipidemia, unspecified: Secondary | ICD-10-CM | POA: Diagnosis present

## 2021-10-02 DIAGNOSIS — J9811 Atelectasis: Secondary | ICD-10-CM | POA: Diagnosis present

## 2021-10-02 DIAGNOSIS — Z79899 Other long term (current) drug therapy: Secondary | ICD-10-CM

## 2021-10-02 DIAGNOSIS — I878 Other specified disorders of veins: Secondary | ICD-10-CM | POA: Diagnosis not present

## 2021-10-02 DIAGNOSIS — Z8673 Personal history of transient ischemic attack (TIA), and cerebral infarction without residual deficits: Secondary | ICD-10-CM

## 2021-10-02 DIAGNOSIS — Z9114 Patient's other noncompliance with medication regimen: Secondary | ICD-10-CM

## 2021-10-02 DIAGNOSIS — G40409 Other generalized epilepsy and epileptic syndromes, not intractable, without status epilepticus: Secondary | ICD-10-CM | POA: Diagnosis present

## 2021-10-02 DIAGNOSIS — D696 Thrombocytopenia, unspecified: Secondary | ICD-10-CM | POA: Diagnosis present

## 2021-10-02 DIAGNOSIS — R54 Age-related physical debility: Secondary | ICD-10-CM | POA: Diagnosis present

## 2021-10-02 DIAGNOSIS — J90 Pleural effusion, not elsewhere classified: Secondary | ICD-10-CM | POA: Diagnosis not present

## 2021-10-02 DIAGNOSIS — Z880 Allergy status to penicillin: Secondary | ICD-10-CM

## 2021-10-02 DIAGNOSIS — Z6833 Body mass index (BMI) 33.0-33.9, adult: Secondary | ICD-10-CM

## 2021-10-02 DIAGNOSIS — M810 Age-related osteoporosis without current pathological fracture: Secondary | ICD-10-CM | POA: Diagnosis present

## 2021-10-02 LAB — CBC WITH DIFFERENTIAL/PLATELET
Abs Immature Granulocytes: 0 10*3/uL (ref 0.00–0.07)
Basophils Absolute: 0 10*3/uL (ref 0.0–0.1)
Basophils Relative: 1 %
Eosinophils Absolute: 0 10*3/uL (ref 0.0–0.5)
Eosinophils Relative: 1 %
HCT: 43.5 % (ref 36.0–46.0)
Hemoglobin: 14.2 g/dL (ref 12.0–15.0)
Immature Granulocytes: 0 %
Lymphocytes Relative: 24 %
Lymphs Abs: 0.9 10*3/uL (ref 0.7–4.0)
MCH: 32.1 pg (ref 26.0–34.0)
MCHC: 32.6 g/dL (ref 30.0–36.0)
MCV: 98.4 fL (ref 80.0–100.0)
Monocytes Absolute: 0.4 10*3/uL (ref 0.1–1.0)
Monocytes Relative: 10 %
Neutro Abs: 2.4 10*3/uL (ref 1.7–7.7)
Neutrophils Relative %: 64 %
Platelets: 118 10*3/uL — ABNORMAL LOW (ref 150–400)
RBC: 4.42 MIL/uL (ref 3.87–5.11)
RDW: 14.1 % (ref 11.5–15.5)
WBC: 3.8 10*3/uL — ABNORMAL LOW (ref 4.0–10.5)
nRBC: 0 % (ref 0.0–0.2)

## 2021-10-02 LAB — BASIC METABOLIC PANEL
Anion gap: 9 (ref 5–15)
BUN: 17 mg/dL (ref 8–23)
CO2: 30 mmol/L (ref 22–32)
Calcium: 9.5 mg/dL (ref 8.9–10.3)
Chloride: 98 mmol/L (ref 98–111)
Creatinine, Ser: 0.9 mg/dL (ref 0.44–1.00)
GFR, Estimated: >60 mL/min (ref >60)
Glucose, Bld: 98 mg/dL (ref 70–99)
Potassium: 3.9 mmol/L (ref 3.5–5.1)
Sodium: 137 mmol/L (ref 135–145)

## 2021-10-02 LAB — TROPONIN I (HIGH SENSITIVITY): Troponin I (High Sensitivity): 40 ng/L — ABNORMAL HIGH (ref <18)

## 2021-10-02 LAB — BRAIN NATRIURETIC PEPTIDE: B Natriuretic Peptide: 986.5 pg/mL — ABNORMAL HIGH (ref 0.0–100.0)

## 2021-10-02 MED ORDER — ALBUTEROL SULFATE HFA 108 (90 BASE) MCG/ACT IN AERS
2.0000 | INHALATION_SPRAY | RESPIRATORY_TRACT | Status: DC | PRN
  Filled 2021-10-02: qty 6.7

## 2021-10-02 MED ORDER — FUROSEMIDE 10 MG/ML IJ SOLN
40.0000 mg | Freq: Once | INTRAMUSCULAR | Status: AC
  Administered 2021-10-02: 40 mg via INTRAVENOUS
  Filled 2021-10-02: qty 4

## 2021-10-02 NOTE — ED Triage Notes (Signed)
Pt reports shob X3 days. Denies chest pain, cough or fevers. Pt talking in complete sentences, in NAD.

## 2021-10-02 NOTE — ED Provider Notes (Signed)
[facility] EMERGENCY DEPARTMENT Provider Note   CSN: [id number] Arrival date & time: 10/02/21  1316     History Chief Complaint  Patient presents with   Shortness of Breath    Misty Gray is a 70 y.o. female.  Patient with history of chronic atrial fibrillation, pacemaker, chronic anticoagulation, HTN, HLD, obesity, CHF, NICM, nighttime O2 dependent presents with worsening SOB over the last 2 days. She reports lying flat "feels suffocating" and walking any distance causes symptoms. She denies chest pain, vomiting, known fever.   The history is provided by the patient. No language interpreter was used.  Shortness of Breath Associated symptoms: no abdominal pain, no chest pain and no fever       Past Medical History:  Diagnosis Date   Abnormal CT of the head 12/16/1984   R parietal atrophy   Allergic rhinitis, cause unspecified    Altered mental status 11/28/2016   Anemia    Arthritis    "hands and knees" (09/06/2015)   Atrial fibrillation (HCC)    Cellulitis 09/07/2015   Cellulitis and abscess of leg 09/2016   bilateral   CHF (congestive heart failure) (HCC)    Diaphragmatic hernia without mention of obstruction or gangrene    Dyspnea    Exertional dyspnea 06/22/12   Family history of adverse reaction to anesthesia    "daughter fights w/them; just can't relax during OR; stay awake during the OR"   Fatigue 06/22/2012   Female stress incontinence    GERD (gastroesophageal reflux disease)    Grand mal seizure (HCC)    H/O hiatal hernia    two removed   Heart murmur    High cholesterol    History of blood transfusion 1956   "related to nose bleed"   Hypertension    Influenza A 01/11/2014   Lichenification and lichen simplex chronicus    Migraine    "when I was a teenager"   Morbid obesity (HCC)    Nonischemic cardiomyopathy (HCC)    EF has normalized-repeat Pending    On home oxygen therapy    "2L when I'm asleep in bed" (09/06/2015)   OSA on CPAP     Pacemaker  st judes    Patient states "this is my third pacemaker"   Pneumonia 2004; 2011; 11/2011   Seizures (HCC) since 1981   "can hear you talking but sounds like you are in big tunnel; have them often if not taking RX; last one was 06/17/12" (09/06/2015)   Shortness of breath 10/24/2010   Qualifier: Diagnosis of  By: [place], Bonnie     Sick sinus syndrome (HCC)    WITH PRIOR DDD PACEMAKER IMPLANTATION   Skin irritation 07/23/2017   Somnolence    Stroke (HCC) 1988   "mouth drawed real bad on my left side; found out it was a seizure"   Syncope and collapse 06/22/12   "hit forehead and left knee"; denies loss of consciousness   Unspecified venous (peripheral) insufficiency     Patient Active Problem List   Diagnosis Date Noted   Stasis dermatitis of both legs 08/05/2021   Hand injury, left, initial encounter 09/22/2020   CHF exacerbation (HCC) 07/12/2020   Hospital discharge follow-up 11/17/2019   Onychomycosis 11/17/2019   Abdominal wall hernia 11/05/2017   Anticoagulated 11/05/2017   Rash and nonspecific skin eruption 09/15/2017   Ventral hernia without obstruction or gangrene 08/15/2017   Chronic venous stasis dermatitis 12/26/2016   Urinary tract infection without hematuria  Frequent PVCs    Hypokalemia    Housing problems 11/23/2016   Chronic atrial fibrillation (HCC) 11/01/2016   Hypotension 10/31/2016   Healthcare maintenance 08/12/2016   Heme positive stool 08/09/2016   Intertrigo 08/09/2016   Chronic anticoagulation 04/26/2016   Chronic venous insufficiency    Cellulitis 09/07/2015   Breast mass, left 10/05/2014   Osteopenia 09/15/2014   Hypertension 01/04/2014   Chronic systolic dysfunction of left ventricle 05/05/2013   At high risk for falls 02/11/2013   Vitamin D deficiency 07/15/2012   Sinoatrial node dysfunction (HCC) 05/08/2011   Pacemaker 08/10/2009   Allergic rhinitis 05/31/2008   Morbid obesity (HCC) 02/12/2007   GASTROESOPHAGEAL REFLUX, NO  ESOPHAGITIS 02/12/2007   Female stress incontinence 02/12/2007   Seizure disorder (HCC) 02/12/2007   OSA (obstructive sleep apnea) 02/12/2007    Past Surgical History:  Procedure Laterality Date   APPENDECTOMY     CARDIAC CATHETERIZATION N/A 10/03/2016   Procedure: Right/Left Heart Cath and Coronary Angiography;  Surgeon: Dolores Patty, MD;  Location: [facility] INVASIVE CV LAB;  Service: Cardiovascular;  Laterality: N/A;   HERNIA REPAIR  ? 1988; 04/15/1998   INSERT / REPLACE / REMOVE PACEMAKER  12/16/1990   DDD EF 30%;   INSERT / REPLACE / REMOVE PACEMAKER  07/25/2003   replaced by BB, leads are both IS1 and from 1992   INSERT / REPLACE / REMOVE PACEMAKER  05/21/2011   JA; generator change   LEG SKIN LESION  BIOPSY / EXCISION  05/16/2001   Lichen Planus   ORIF RADIAL FRACTURE Left 06/01/2021   Procedure: OPEN REDUCTION INTERNAL FIXATION (ORIF) RADIAL FRACTURE;  Surgeon: Dairl Ponder, MD;  Location: [facility] OR;  Service: Orthopedics;  Laterality: Left;  AXILLIARY BLOCK   PPM GENERATOR CHANGEOUT N/A 06/21/2021   Procedure: PPM GENERATOR CHANGEOUT;  Surgeon: [name], MD;  Location: [facility] INVASIVE CV LAB;  Service: Cardiovascular;  Laterality: N/A;   VENTRAL HERNIA REPAIR  1989; 04/15/1998     OB History   No obstetric history on file.     Family History  Problem Relation Age of Onset   Stroke Father        died from CAD?   Heart disease Father    Cancer Mother    Diabetes Son        Type 1   Sleep apnea Son    Cancer Sister        cervical   Hypertension Sister    Hypertension Brother    Rheum arthritis Daughter        Also PGM, PGGM    Social History   Tobacco Use   Smoking status: Former    Packs/day: 1.00    Years: 7.00    Pack years: 7.00    Types: Cigarettes    Quit date: 03/16/1969    Years since quitting: 52.5   Smokeless tobacco: Never  Vaping Use   Vaping Use: Never used  Substance Use Topics   Alcohol use: No   Drug use: No    Home Medications Prior to  Admission medications   Medication Sig Start Date End Date Taking? Authorizing Provider  amiodarone (PACERONE) 200 MG tablet Take 0.5 tablets (100 mg total) by mouth daily. 05/15/21   Clegg, Amy D, NP  D3-1000 25 MCG (1000 UT) tablet TAKE 1 TABLET BY MOUTH DAILY (AM) 09/24/21   Chambliss, Estill Batten, MD  edoxaban (SAVAYSA) 60 MG TABS tablet Take 60 mg by mouth daily. 02/22/21   Jacklynn Ganong,  FNP  fluticasone (FLONASE) 50 MCG/ACT nasal spray Place 2 sprays into both nostrils daily as needed. For congestion. 07/28/17   Latrelle Dodrill, MD  HYDROcodone-acetaminophen (NORCO/VICODIN) 5-325 MG tablet Take 1 tablet by mouth every 6 (six) hours as needed for pain. 06/07/21   [provider]  losartan (COZAAR) 25 MG tablet Take 0.5 tablets (12.5 mg total) by mouth every morning. 07/25/21   [name], [name], MD  omeprazole (PRILOSEC) 20 MG capsule Take 1 capsule (20 mg total) by mouth daily. TAKE ONE CAPSULE BY MOUTH EVERY MORNING 09/24/21   Chambliss, Estill Batten, MD  PHENobarbital (LUMINAL) 64.8 MG tablet TAKE ONE TABLET BY MOUTH TWICE DAILY (AM+BEDTIME) 09/24/21   Chambliss, Estill Batten, MD  potassium chloride (KLOR-CON) 10 MEQ tablet TAKE 4 TABLETS BY MOUTH THREE TIMES DAILY (MORNING, NOON, AND NIGHT) 12/29/20   [name], [name], MD  potassium chloride (KLOR-CON) 10 MEQ tablet TAKE 4 TABLETS BY MOUTH EVERY MORNING, 4 TABLETS IN THE EVENING AND 4 TABLETS AT BEDTIME 09/26/21   [name], [name], MD  spironolactone (ALDACTONE) 25 MG tablet Take 1 tablet (25 mg total) by mouth daily. 07/25/21   [name], [name], MD  torsemide (DEMADEX) 20 MG tablet TAKE 4 TABLETS BY MOUTH EVERY MORNING AND 3 TABLETS EVERY EVENING 09/26/21   [name], [name], MD  triamcinolone cream (KENALOG) 0.1 % Apply 1 application topically 2 (two) times daily. 08/22/21   Dana Allan, MD    Allergies    Aspirin, Penicillins, Tape, Wool alcohol [lanolin], Amoxicillin, and Ampicillin  Review of Systems   Review of  Systems  Constitutional:  Negative for chills and fever.  HENT: Negative.    Respiratory:  Positive for chest tightness and shortness of breath.        Orthopnea  Cardiovascular: Negative.  Negative for chest pain and leg swelling.  Gastrointestinal: Negative.  Negative for abdominal pain.  Musculoskeletal: Negative.   Skin: Negative.   Neurological: Negative.    Physical Exam Updated Vital Signs BP (!) 119/93   Pulse 73   Temp 98 F (36.7 C) (Oral)   Resp 17   SpO2 100%   Physical Exam Vitals and nursing note reviewed.  Constitutional:      Appearance: She is well-developed. She is obese.  HENT:     Head: Normocephalic.     Mouth/Throat:     Mouth: Mucous membranes are moist.  Cardiovascular:     Rate and Rhythm: Normal rate. Rhythm irregular.  Pulmonary:     Effort: Pulmonary effort is normal.     Breath sounds: No wheezing, rhonchi or rales.  Musculoskeletal:     Cervical back: Normal range of motion and neck supple.     Comments: Chronic venous stasis dermatitis without significant pitting edema.   Skin:    General: Skin is warm and dry.     Coloration: Skin is not cyanotic.  Neurological:     Mental Status: She is alert and oriented to person, place, and time.    ED Results / Procedures / Treatments   Labs (all labs ordered are listed, but only abnormal results are displayed) Labs Reviewed  BRAIN NATRIURETIC PEPTIDE - Abnormal; Notable for the following components:      Result Value   B Natriuretic Peptide 986.5 (*)    All other components within normal limits  CBC WITH DIFFERENTIAL/PLATELET - Abnormal; Notable for the following components:   WBC 3.8 (*)    Platelets 118 (*)    All other  components within normal limits  TROPONIN I (HIGH SENSITIVITY) - Abnormal; Notable for the following components:   Troponin I (High Sensitivity) 40 (*)    All other components within normal limits  RESP PANEL BY RT-PCR (FLU A&B, COVID) [id number]  BASIC METABOLIC PANEL     EKG EKG Interpretation  Date/Time:  Tuesday October 02 2021 13:58:03 EDT Ventricular Rate:  69 PR Interval:    QRS Duration: 116 QT Interval:  282 QTC Calculation: 302 R Axis:   0 Text Interpretation: Atrial fibrillation with a competing junctional pacemaker with premature ventricular or aberrantly conducted complexes Anterior infarct , age undetermined Abnormal ECG No significant change since last tracing Confirmed by Jacalyn Lefevre [phone]) on 10/02/2021 10:03:35 PM  Radiology DG Chest 2 View  Result Date: 10/02/2021 CLINICAL DATA:  Shortness of breath. EXAM: CHEST - 2 VIEW COMPARISON:  05/08/2021 FINDINGS: The heart is enlarged but appears stable. The pacer wires are stable. Central vascular congestion and increased inter septal lines/Kerley B-lines suggesting interstitial edema. Small left pleural effusion and left basilar atelectasis. IMPRESSION: CHF with small left pleural effusion and left basilar atelectasis. Electronically Signed   By: Rudie Meyer M.D.   On: 10/02/2021 14:45    Procedures Procedures   Medications Ordered in ED Medications  albuterol (VENTOLIN HFA) 108 (90 Base) MCG/ACT inhaler 2 puff (has no administration in time range)    ED Course  I have reviewed the triage vital signs and the nursing notes.  Pertinent labs & imaging results that were available during my care of the patient were reviewed by me and considered in my medical decision making (see chart for details).    MDM Rules/Calculators/A&P                           Patient to ED with SOB, worse than usual, over 2 days. See HPI for detail.  The patient appears comfortable while lying in bed at incline. VSS. She is on room air and maintaining 90%+. She is seen by Dr. Particia Nearing. With CXR showing CHF, patient given 40 mg IV Lasix. After she began diuresing, she is ambulated. She reports significant SOB when walking. O2 saturation dropped to the 80's.   The patient will require admission for  further stabilization prior to discharge home. This was discussed with the admitting [facility] resident who accepts for admission.    Final Clinical Impression(s) / ED Diagnoses Final diagnoses:  None   CHF hypoxia  Rx / DC Orders ED Discharge Orders     None        Elpidio Anis, PA-C 10/04/21 0547    Jacalyn Lefevre, MD 10/06/21 1230

## 2021-10-02 NOTE — ED Provider Notes (Signed)
Emergency Medicine Provider Triage Evaluation Note  Misty Gray , a 70 y.o. female  was evaluated in triage.  Pt complains of shortness of breath for the past 3 days.  History of heart failure.  Review of Systems  Positive: Shortness of breath Negative: Chest pain  Physical Exam  BP 102/72 (BP Location: Left Arm)   Pulse 76   Temp 98 F (36.7 C) (Oral)   Resp 16   SpO2 95%  Gen:   Awake, no distress   Resp:  Normal effort  MSK:   Moves extremities without difficulty  Other:  Wheezes in left lower and right upper lobes.  No rales or crackles.  Medical Decision Making  Medically screening exam initiated at 1:57 PM.  Appropriate orders placed.  JULIEANN DRUMMONDS was informed that the remainder of the evaluation will be completed by another provider, this initial triage assessment does not replace that evaluation, and the importance of remaining in the ED until their evaluation is complete.     Saddie Benders, PA-C 10/02/21 1404    Mancel Bale, MD 10/03/21 1056

## 2021-10-03 ENCOUNTER — Other Ambulatory Visit: Payer: Self-pay

## 2021-10-03 ENCOUNTER — Encounter (HOSPITAL_COMMUNITY): Payer: Self-pay | Admitting: Student

## 2021-10-03 DIAGNOSIS — E785 Hyperlipidemia, unspecified: Secondary | ICD-10-CM | POA: Diagnosis present

## 2021-10-03 DIAGNOSIS — I428 Other cardiomyopathies: Secondary | ICD-10-CM | POA: Diagnosis present

## 2021-10-03 DIAGNOSIS — Z881 Allergy status to other antibiotic agents status: Secondary | ICD-10-CM | POA: Diagnosis not present

## 2021-10-03 DIAGNOSIS — R54 Age-related physical debility: Secondary | ICD-10-CM | POA: Diagnosis present

## 2021-10-03 DIAGNOSIS — Z886 Allergy status to analgesic agent status: Secondary | ICD-10-CM | POA: Diagnosis not present

## 2021-10-03 DIAGNOSIS — R0902 Hypoxemia: Secondary | ICD-10-CM | POA: Diagnosis present

## 2021-10-03 DIAGNOSIS — G4733 Obstructive sleep apnea (adult) (pediatric): Secondary | ICD-10-CM | POA: Diagnosis present

## 2021-10-03 DIAGNOSIS — D696 Thrombocytopenia, unspecified: Secondary | ICD-10-CM | POA: Diagnosis present

## 2021-10-03 DIAGNOSIS — Z950 Presence of cardiac pacemaker: Secondary | ICD-10-CM | POA: Diagnosis not present

## 2021-10-03 DIAGNOSIS — I495 Sick sinus syndrome: Secondary | ICD-10-CM | POA: Diagnosis present

## 2021-10-03 DIAGNOSIS — I878 Other specified disorders of veins: Secondary | ICD-10-CM | POA: Diagnosis present

## 2021-10-03 DIAGNOSIS — I472 Ventricular tachycardia, unspecified: Secondary | ICD-10-CM | POA: Diagnosis present

## 2021-10-03 DIAGNOSIS — E669 Obesity, unspecified: Secondary | ICD-10-CM | POA: Diagnosis present

## 2021-10-03 DIAGNOSIS — G40409 Other generalized epilepsy and epileptic syndromes, not intractable, without status epilepticus: Secondary | ICD-10-CM | POA: Diagnosis present

## 2021-10-03 DIAGNOSIS — Z9109 Other allergy status, other than to drugs and biological substances: Secondary | ICD-10-CM | POA: Diagnosis not present

## 2021-10-03 DIAGNOSIS — Z9981 Dependence on supplemental oxygen: Secondary | ICD-10-CM | POA: Diagnosis not present

## 2021-10-03 DIAGNOSIS — Z20822 Contact with and (suspected) exposure to covid-19: Secondary | ICD-10-CM | POA: Diagnosis present

## 2021-10-03 DIAGNOSIS — I5021 Acute systolic (congestive) heart failure: Secondary | ICD-10-CM | POA: Diagnosis not present

## 2021-10-03 DIAGNOSIS — I482 Chronic atrial fibrillation, unspecified: Secondary | ICD-10-CM | POA: Diagnosis present

## 2021-10-03 DIAGNOSIS — M810 Age-related osteoporosis without current pathological fracture: Secondary | ICD-10-CM | POA: Diagnosis present

## 2021-10-03 DIAGNOSIS — I5023 Acute on chronic systolic (congestive) heart failure: Secondary | ICD-10-CM | POA: Diagnosis present

## 2021-10-03 DIAGNOSIS — Z6833 Body mass index (BMI) 33.0-33.9, adult: Secondary | ICD-10-CM | POA: Diagnosis not present

## 2021-10-03 DIAGNOSIS — J9811 Atelectasis: Secondary | ICD-10-CM | POA: Diagnosis present

## 2021-10-03 DIAGNOSIS — I509 Heart failure, unspecified: Secondary | ICD-10-CM | POA: Diagnosis present

## 2021-10-03 DIAGNOSIS — I110 Hypertensive heart disease with heart failure: Secondary | ICD-10-CM | POA: Diagnosis present

## 2021-10-03 DIAGNOSIS — Z880 Allergy status to penicillin: Secondary | ICD-10-CM | POA: Diagnosis not present

## 2021-10-03 LAB — COMPREHENSIVE METABOLIC PANEL
ALT: 13 U/L (ref 0–44)
AST: 20 U/L (ref 15–41)
Albumin: 2.7 g/dL — ABNORMAL LOW (ref 3.5–5.0)
Alkaline Phosphatase: 108 U/L (ref 38–126)
Anion gap: 10 (ref 5–15)
BUN: 19 mg/dL (ref 8–23)
CO2: 26 mmol/L (ref 22–32)
Calcium: 9.1 mg/dL (ref 8.9–10.3)
Chloride: 100 mmol/L (ref 98–111)
Creatinine, Ser: 0.79 mg/dL (ref 0.44–1.00)
GFR, Estimated: >60 mL/min (ref >60)
Glucose, Bld: 93 mg/dL (ref 70–99)
Potassium: 3.2 mmol/L — ABNORMAL LOW (ref 3.5–5.1)
Sodium: 136 mmol/L (ref 135–145)
Total Bilirubin: 0.7 mg/dL (ref 0.3–1.2)
Total Protein: 6.1 g/dL — ABNORMAL LOW (ref 6.5–8.1)

## 2021-10-03 LAB — LIPID PANEL
Cholesterol: 137 mg/dL (ref 0–200)
HDL: 51 mg/dL (ref >40)
LDL Cholesterol: 76 mg/dL (ref 0–99)
Total CHOL/HDL Ratio: 2.7 RATIO
Triglycerides: 50 mg/dL (ref <150)
VLDL: 10 mg/dL (ref 0–40)

## 2021-10-03 LAB — RESP PANEL BY RT-PCR (FLU A&B, COVID) ARPGX2
Influenza A by PCR: NEGATIVE
Influenza B by PCR: NEGATIVE
SARS Coronavirus 2 by RT PCR: NEGATIVE

## 2021-10-03 LAB — GLUCOSE, CAPILLARY
Glucose-Capillary: 112 mg/dL — ABNORMAL HIGH (ref 70–99)
Glucose-Capillary: 95 mg/dL (ref 70–99)

## 2021-10-03 LAB — HEMOGLOBIN A1C
Hgb A1c MFr Bld: 5.3 % (ref 4.8–5.6)
Mean Plasma Glucose: 105.41 mg/dL

## 2021-10-03 LAB — CBC
HCT: 40.1 % (ref 36.0–46.0)
Hemoglobin: 13 g/dL (ref 12.0–15.0)
MCH: 31.8 pg (ref 26.0–34.0)
MCHC: 32.4 g/dL (ref 30.0–36.0)
MCV: 98 fL (ref 80.0–100.0)
Platelets: 100 10*3/uL — ABNORMAL LOW (ref 150–400)
RBC: 4.09 MIL/uL (ref 3.87–5.11)
RDW: 14.1 % (ref 11.5–15.5)
WBC: 3.9 10*3/uL — ABNORMAL LOW (ref 4.0–10.5)
nRBC: 0 % (ref 0.0–0.2)

## 2021-10-03 LAB — TSH: TSH: 1.766 u[IU]/mL (ref 0.350–4.500)

## 2021-10-03 LAB — MAGNESIUM: Magnesium: 2 mg/dL (ref 1.7–2.4)

## 2021-10-03 LAB — POTASSIUM: Potassium: 3.8 mmol/L (ref 3.5–5.1)

## 2021-10-03 LAB — MRSA NEXT GEN BY PCR, NASAL: MRSA by PCR Next Gen: NOT DETECTED

## 2021-10-03 LAB — PHENOBARBITAL LEVEL: Phenobarbital: 19.1 ug/mL (ref 15.0–30.0)

## 2021-10-03 MED ORDER — TORSEMIDE 20 MG PO TABS
60.0000 mg | ORAL_TABLET | Freq: Every day | ORAL | Status: DC
  Administered 2021-10-04: 60 mg via ORAL
  Filled 2021-10-03 (×2): qty 3

## 2021-10-03 MED ORDER — LOSARTAN POTASSIUM 25 MG PO TABS
12.5000 mg | ORAL_TABLET | Freq: Every morning | ORAL | Status: DC
  Filled 2021-10-03: qty 0.5

## 2021-10-03 MED ORDER — FUROSEMIDE 10 MG/ML IJ SOLN
80.0000 mg | Freq: Two times a day (BID) | INTRAMUSCULAR | Status: AC
  Administered 2021-10-03 (×2): 80 mg via INTRAVENOUS
  Filled 2021-10-03 (×2): qty 8

## 2021-10-03 MED ORDER — AMIODARONE HCL 100 MG PO TABS
100.0000 mg | ORAL_TABLET | Freq: Every day | ORAL | Status: DC
  Administered 2021-10-03 – 2021-10-05 (×3): 100 mg via ORAL
  Filled 2021-10-03 (×3): qty 1

## 2021-10-03 MED ORDER — ACETAMINOPHEN 325 MG PO TABS: 650.0000 mg | ORAL_TABLET | Freq: Four times a day (QID) | ORAL | Status: DC | PRN

## 2021-10-03 MED ORDER — PANTOPRAZOLE SODIUM 40 MG PO TBEC
40.0000 mg | DELAYED_RELEASE_TABLET | Freq: Every day | ORAL | Status: DC
  Filled 2021-10-03: qty 1

## 2021-10-03 MED ORDER — ACETAMINOPHEN 650 MG RE SUPP: 650.0000 mg | Freq: Four times a day (QID) | RECTAL | Status: DC | PRN

## 2021-10-03 MED ORDER — PANTOPRAZOLE SODIUM 40 MG PO TBEC
40.0000 mg | DELAYED_RELEASE_TABLET | Freq: Every day | ORAL | Status: DC
  Administered 2021-10-03 – 2021-10-05 (×3): 40 mg via ORAL
  Filled 2021-10-03 (×2): qty 1

## 2021-10-03 MED ORDER — POTASSIUM CHLORIDE 20 MEQ PO PACK
40.0000 meq | PACK | ORAL | Status: AC
  Administered 2021-10-03 (×2): 40 meq via ORAL
  Filled 2021-10-03: qty 2

## 2021-10-03 MED ORDER — EDOXABAN TOSYLATE 30 MG PO TABS
60.0000 mg | ORAL_TABLET | Freq: Every day | ORAL | Status: DC
  Administered 2021-10-04 – 2021-10-05 (×2): 60 mg via ORAL
  Filled 2021-10-03 (×3): qty 60
  Filled 2021-10-03: qty 2
  Filled 2021-10-03: qty 60
  Filled 2021-10-03: qty 2

## 2021-10-03 MED ORDER — SPIRONOLACTONE 25 MG PO TABS
25.0000 mg | ORAL_TABLET | Freq: Every day | ORAL | Status: DC
  Administered 2021-10-03 – 2021-10-05 (×3): 25 mg via ORAL
  Filled 2021-10-03 (×3): qty 1

## 2021-10-03 MED ORDER — POTASSIUM CHLORIDE 20 MEQ PO PACK
40.0000 meq | PACK | Freq: Two times a day (BID) | ORAL | Status: DC
  Filled 2021-10-03: qty 2

## 2021-10-03 MED ORDER — PHENOBARBITAL 32.4 MG PO TABS
64.8000 mg | ORAL_TABLET | Freq: Two times a day (BID) | ORAL | Status: DC
  Administered 2021-10-03 – 2021-10-05 (×5): 64.8 mg via ORAL
  Filled 2021-10-03 (×5): qty 2

## 2021-10-03 MED ORDER — ALBUTEROL SULFATE (2.5 MG/3ML) 0.083% IN NEBU: 2.5000 mg | INHALATION_SOLUTION | RESPIRATORY_TRACT | Status: DC | PRN

## 2021-10-03 MED ORDER — TORSEMIDE 20 MG PO TABS
80.0000 mg | ORAL_TABLET | Freq: Every day | ORAL | Status: DC
  Administered 2021-10-04 – 2021-10-05 (×2): 80 mg via ORAL
  Filled 2021-10-03 (×2): qty 4

## 2021-10-03 MED ORDER — PANTOPRAZOLE SODIUM 40 MG PO TBEC: 40.0000 mg | DELAYED_RELEASE_TABLET | ORAL | Status: DC

## 2021-10-03 NOTE — H&P (Addendum)
Family Medicine Teaching [facility] Admission History and Physical Service Pager: [phone]  [name] Medical record number: [id number] Date of birth: [birth year] Age: 70 y.o. Gender: female  Primary Care Provider: Latrelle Dodrill, MD Consultants: none Code Status: FULL Preferred Emergency Contact: Son, Argie Ramming  Chief Complaint: Shortness of breath   Assessment and Plan: LOYS CWIKLA is a 70 y.o. female presenting with worsening shortness of breath . PMH is significant for HFrEF 25-30%, A. fib, chronic venous stasis, HTN, OSA, thrombocytopenia and seizure  Acute on chronic CHF exacerbation Last Echo 07/13/21 showed HFrEF 25 - 30%. Patient presented with worsening SOB for 3 days in the setting of medication non-adherence. CXR showed small pleural effusion with atelectasis.  On exam, she has diffused crackles and mild LE swelling that could be associated with either her CHF or  chronic venous stasis . Patient's report of PND with self-reported non compliance of her medication in the last couple of days have strong indication for CHF exacerbation due to non compliance with medication.  She denies any fever, chills or productive cough  which make pneumonia is less likely. Doubt PE. On home medication of spironolactone 25 mg daily, torsemide 80 mg in the a.m. and 60 mg in the p.m. Pt given 40 mg IV in the ED. Will treat with home dose Lasix equivalent.  -Admit to FPTS, Dr. Lum Babe admitting -Continue cardiac monitoring -IV Lasix 80 mg BID -Ordered Echo  -Strict I's/O's -Daily weights -Ordered morning lab: BMP/CBC/Mg -PT/OT recs -Mg >2, K >4 -Obtain risk stratification labs   Atrial fibrillation History of tachy-bradycardia s/p pacemaker . EKG on admission showed AFIB. On exam, she has irregularly irregular rhythm. She denies having palpitations or chest pain. On home, medication of amiodarone 100 mg daily and edoxaban 60 mg daily. -Continue home  medication  Thrombocytopenia Patient's platelet on admission was 118. This appears to be a chronic in nature for patient. Will recommend she follow up outpatient with her PCP for work up or management.  -Daily Lab: CBC  -Follow up out patient with PCP -Discontinue Lovenox if Plt <50k  Hypertension Stable; Patient have remained normotensive since admission. Home medication of losartan 12.5 mg every morning, and spironolactone 25 mg daily. -Continue home medication -Routine vitals  Chronic Venous Stasis Dermatisis  Patient has a history of venous stasis dermatitis. She said she has had those for years. On exam she has erythema to the mid shin on LE bilaterally but no weeping. LE were warm to touch with +2 pedal pulse bilaterally. Her WBC 3.8; low suspicion for cellulitis with patient being afebrile.  Will consider cellulitis work up and abx treatment should patient become febrile or develop leukocytosis. -encourage patient to keep legs elevated when possible.  Hx of seizure On home medication of phenobarbital 64.8 mg twice daily. States she had a "small-mini one" two days ago. She forgot to take her medication while taking care of her husband. Denies tremors, she felt like she was drunk with the room spinning and ha to hold on to a furniture to prevent falling.  -Continue home phenobarbital -Seizure precautions  OSA CPAP at home at night -ordered CPAP   FEN/GI: Regular diet Prophylaxis: Edoxaban  Disposition: Med telemetry  History of Present Illness:  Misty Gray is a 70 y.o. female presenting with worsening shortness of breath.  Patient reports that she has been having worsening shortness of breath in the last 3 days. She said she has not been  complaint with her medication the last couple of days because she was focused on taking care of her husband that she will forget to take her medication. She reports having non-productive cough. Her shortness of breath is worse when she tries  to lay down flat on her back. Patient gets "short-winded" easily when she is doing work around the house and her breathing improved with 30-45 minutes of rest. She denies any fever, chills or headache. Patient has history of heart failure and reports leg swelling attributed to her "cellulitis".  She reports a reports having a seizure 2 days ago in which she felt drunk, the room was spinning and she had to hold onto a furniture. She denies having a tremor or falling with the episode (her description sounded more like an orthostatic syncope).  Of note, patient's husband is currently admitted in the hospital.   Review Of Systems: Per HPI with the following additions:   Review of Systems  Constitutional:  Negative for chills and fever.  HENT:  Positive for congestion. Negative for sore throat. Rhinorrhea: chronic.  Eyes:        Wears glasses, noticed change in vision with cataract  Respiratory:  Positive for cough, choking and shortness of breath.   Cardiovascular:  Positive for leg swelling. Negative for chest pain and palpitations.  Gastrointestinal:  Negative for abdominal pain, blood in stool, constipation and diarrhea.  Genitourinary:  Negative for dysuria.  Musculoskeletal:  Negative for neck pain.  Skin:        Chronic LE dermatitis   Neurological:  Positive for dizziness. Negative for headaches.    Patient Active Problem List   Diagnosis Date Noted   Stasis dermatitis of both legs 08/05/2021   Hand injury, left, initial encounter 09/22/2020   CHF exacerbation (HCC) 07/12/2020   Hospital discharge follow-up 11/17/2019   Onychomycosis 11/17/2019   Abdominal wall hernia 11/05/2017   Anticoagulated 11/05/2017   Rash and nonspecific skin eruption 09/15/2017   Ventral hernia without obstruction or gangrene 08/15/2017   Chronic venous stasis dermatitis 12/26/2016   Urinary tract infection without hematuria    Frequent PVCs    Hypokalemia    Housing problems 11/23/2016   Chronic  atrial fibrillation (HCC) 11/01/2016   Hypotension 10/31/2016   Healthcare maintenance 08/12/2016   Heme positive stool 08/09/2016   Intertrigo 08/09/2016   Chronic anticoagulation 04/26/2016   Chronic venous insufficiency    Cellulitis 09/07/2015   Breast mass, left 10/05/2014   Osteopenia 09/15/2014   Hypertension 01/04/2014   Chronic systolic dysfunction of left ventricle 05/05/2013   At high risk for falls 02/11/2013   Vitamin D deficiency 07/15/2012   Sinoatrial node dysfunction (HCC) 05/08/2011   Pacemaker 08/10/2009   Allergic rhinitis 05/31/2008   Morbid obesity (HCC) 02/12/2007   GASTROESOPHAGEAL REFLUX, NO ESOPHAGITIS 02/12/2007   Female stress incontinence 02/12/2007   Seizure disorder (HCC) 02/12/2007   OSA (obstructive sleep apnea) 02/12/2007    Past Medical History: Past Medical History:  Diagnosis Date   Abnormal CT of the head 12/16/1984   R parietal atrophy   Allergic rhinitis, cause unspecified    Altered mental status 11/28/2016   Anemia    Arthritis    "hands and knees" (09/06/2015)   Atrial fibrillation (HCC)    Cellulitis 09/07/2015   Cellulitis and abscess of leg 09/2016   bilateral   CHF (congestive heart failure) (HCC)    Diaphragmatic hernia without mention of obstruction or gangrene    Dyspnea    Exertional  dyspnea 06/22/12   Family history of adverse reaction to anesthesia    "daughter fights w/them; just can't relax during OR; stay awake during the OR"   Fatigue 06/22/2012   Female stress incontinence    GERD (gastroesophageal reflux disease)    Grand mal seizure (HCC)    H/O hiatal hernia    two removed   Heart murmur    High cholesterol    History of blood transfusion 1956   "related to nose bleed"   Hypertension    Influenza A 01/11/2014   Lichenification and lichen simplex chronicus    Migraine    "when I was a teenager"   Morbid obesity (HCC)    Nonischemic cardiomyopathy (HCC)    EF has normalized-repeat Pending    On home oxygen  therapy    "2L when I'm asleep in bed" (09/06/2015)   OSA on CPAP    Pacemaker  st judes    Patient states "this is my third pacemaker"   Pneumonia 2004; 2011; 11/2011   Seizures (HCC) since 1981   "can hear you talking but sounds like you are in big tunnel; have them often if not taking RX; last one was 06/17/12" (09/06/2015)   Shortness of breath 10/24/2010   Qualifier: Diagnosis of  By: [place], Bonnie     Sick sinus syndrome (HCC)    WITH PRIOR DDD PACEMAKER IMPLANTATION   Skin irritation 07/23/2017   Somnolence    Stroke (HCC) 1988   "mouth drawed real bad on my left side; found out it was a seizure"   Syncope and collapse 06/22/12   "hit forehead and left knee"; denies loss of consciousness   Unspecified venous (peripheral) insufficiency     Past Surgical History: Past Surgical History:  Procedure Laterality Date   APPENDECTOMY     CARDIAC CATHETERIZATION N/A 10/03/2016   Procedure: Right/Left Heart Cath and Coronary Angiography;  Surgeon: Dolores Patty, MD;  Location: [facility] INVASIVE CV LAB;  Service: Cardiovascular;  Laterality: N/A;   HERNIA REPAIR  ? 1988; 04/15/1998   INSERT / REPLACE / REMOVE PACEMAKER  12/16/1990   DDD EF 30%;   INSERT / REPLACE / REMOVE PACEMAKER  07/25/2003   replaced by BB, leads are both IS1 and from 1992   INSERT / REPLACE / REMOVE PACEMAKER  05/21/2011   JA; generator change   LEG SKIN LESION  BIOPSY / EXCISION  05/16/2001   Lichen Planus   ORIF RADIAL FRACTURE Left 06/01/2021   Procedure: OPEN REDUCTION INTERNAL FIXATION (ORIF) RADIAL FRACTURE;  Surgeon: Dairl Ponder, MD;  Location: [facility] OR;  Service: Orthopedics;  Laterality: Left;  AXILLIARY BLOCK   PPM GENERATOR CHANGEOUT N/A 06/21/2021   Procedure: PPM GENERATOR CHANGEOUT;  Surgeon: [name], MD;  Location: [facility] INVASIVE CV LAB;  Service: Cardiovascular;  Laterality: N/A;   VENTRAL HERNIA REPAIR  1989; 04/15/1998    Social History: Social History   Tobacco Use   Smoking status: Former     Packs/day: 1.00    Years: 7.00    Pack years: 7.00    Types: Cigarettes    Quit date: 03/16/1969    Years since quitting: 52.5   Smokeless tobacco: Never  Vaping Use   Vaping Use: Never used  Substance Use Topics   Alcohol use: No   Drug use: No    Please also refer to relevant sections of EMR.  Family History: Family History  Problem Relation Age of Onset   Stroke Father  died from CAD?   Heart disease Father    Cancer Mother    Diabetes Son        Type 1   Sleep apnea Son    Cancer Sister        cervical   Hypertension Sister    Hypertension Brother    Rheum arthritis Daughter        Also PGM, PGGM    Allergies and Medications: Allergies  Allergen Reactions   Aspirin Hives and Rash   Penicillins Rash and Other (See Comments)    Has patient had a PCN reaction causing immediate rash, facial/tongue/throat swelling, SOB or lightheadedness with hypotension: Yes Has patient had a PCN reaction causing severe rash involving mucus membranes or skin necrosis: No Has patient had a PCN reaction that required hospitalization No Has patient had a PCN reaction occurring within the last 10 years: Yes If all of the above answers are "NO", then may proceed with Cephalosporin use.  Patient has tolerated cephalosporins in 2017.   Tape Other (See Comments)    NO PAPER TAPE-MUST BE MEDICAL TAPE - unknown reaction NO PAPER TAPE-MUST BE MEDICAL TAPE - unknown reaction   Wool Alcohol [Lanolin] Hives   Amoxicillin Rash    Patient has tolerated cephalosporins   Ampicillin Itching and Rash    Patient has tolerated cephalosporins   No current facility-administered medications on file prior to encounter.   Current Outpatient Medications on File Prior to Encounter  Medication Sig Dispense Refill   amiodarone (PACERONE) 200 MG tablet Take 0.5 tablets (100 mg total) by mouth daily. 45 tablet 3   D3-1000 25 MCG (1000 UT) tablet TAKE 1 TABLET BY MOUTH DAILY (AM) (Patient taking  differently: Take 1,000 Units by mouth daily.) 28 tablet 1   edoxaban (SAVAYSA) 60 MG TABS tablet Take 60 mg by mouth daily. 30 tablet 11   fluticasone (FLONASE) 50 MCG/ACT nasal spray Place 2 sprays into both nostrils daily as needed. For congestion. 16 g 1   HYDROcodone-acetaminophen (NORCO/VICODIN) 5-325 MG tablet Take 1 tablet by mouth every 6 (six) hours as needed for pain.     losartan (COZAAR) 25 MG tablet Take 0.5 tablets (12.5 mg total) by mouth every morning. 15 tablet 6   omeprazole (PRILOSEC) 20 MG capsule Take 1 capsule (20 mg total) by mouth daily. TAKE ONE CAPSULE BY MOUTH EVERY MORNING 30 capsule 1   PHENobarbital (LUMINAL) 64.8 MG tablet TAKE ONE TABLET BY MOUTH TWICE DAILY (AM+BEDTIME) (Patient taking differently: Take 64.8 mg by mouth 2 (two) times daily.) 56 tablet 0   potassium chloride (KLOR-CON) 10 MEQ tablet TAKE 4 TABLETS BY MOUTH THREE TIMES DAILY (MORNING, NOON, AND NIGHT) (Patient taking differently: Take 40 mEq by mouth 3 (three) times daily.) 360 tablet 6   potassium chloride (KLOR-CON) 10 MEQ tablet TAKE 4 TABLETS BY MOUTH EVERY MORNING, 4 TABLETS IN THE EVENING AND 4 TABLETS AT BEDTIME 360 tablet 2   spironolactone (ALDACTONE) 25 MG tablet Take 1 tablet (25 mg total) by mouth daily. 30 tablet 4   torsemide (DEMADEX) 20 MG tablet TAKE 4 TABLETS BY MOUTH EVERY MORNING AND 3 TABLETS EVERY EVENING (Patient taking differently: Take 60-80 mg by mouth See admin instructions. 80 mg in the morning 60 mg in the evenin) 210 tablet 5   triamcinolone cream (KENALOG) 0.1 % Apply 1 application topically 2 (two) times daily. 30 g 1    Objective: BP 115/79   Pulse 75   Temp 97.9 F (36.6  C) (Oral)   Resp 14   Ht 4\' 9"  (1.448 m)   Wt 77.7 kg   SpO2 100%   BMI 37.09 kg/m   Exam: General: Awake, nontoxic appearing, NAD HEENT:Clear oropharynx, Moist mucus membrane, no scleral injection Cardiovascular: Irregularly irregular rhythm, No murmurs,  Respiratory: Diffuse crackles  bilaterally, no wheezing, nonlabored breathing on room air Abdomen: Soft, distended due to history of hernia, ?mesh left of abdomen, lateral healed scare from previous surgery, No tenderness MSK: Bilateral LE erythema up to mid shin consistent with venostasis dermatitis, non tender callus on medial metatarsal area of the right sole. +2 pedal pulse bilaterally Derm: Dry, Warm. Healed scar on left wrist from previous surgery, long thickened toe nails bilaterally  Neuro: Oriented x3, no focal deficit Psych: Good mood and affect  Labs and Imaging: CBC BMET  Recent Labs  Lab 10/02/21 1410  WBC 3.8*  HGB 14.2  HCT 43.5  PLT 118*   Recent Labs  Lab 10/02/21 1410  NA 137  K 3.9  CL 98  CO2 30  BUN 17  CREATININE 0.90  GLUCOSE 98  CALCIUM 9.5     EKG: Irregular rhythm with absent P-waves consistent with AFI, no acute ST or T wave changes   DG Chest 2 View  Result Date: 10/02/2021 CLINICAL DATA:  Shortness of breath. EXAM: CHEST - 2 VIEW COMPARISON:  05/08/2021 FINDINGS: The heart is enlarged but appears stable. The pacer wires are stable. Central vascular congestion and increased inter septal lines/Kerley B-lines suggesting interstitial edema. Small left pleural effusion and left basilar atelectasis. IMPRESSION: CHF with small left pleural effusion and left basilar atelectasis. Electronically Signed   By: 05/10/2021 M.D.   On: 10/02/2021 14:45     10/04/2021, MD 10/03/2021, 1:16 AM PGY-1, [facility] FPTS Intern pager: [phone], text pages welcome   FPTS Upper-Level Resident Addendum   I have independently interviewed and examined the patient. I have discussed the above with the original author and agree with their documentation. My edits for correction/addition/clarification are in within the document. Please see also any attending notes.   [id number], DO PGY-3, [facility] 10/03/2021 7:02 AM  FPTS Service pager: [phone] (text pages  welcome through [facility])

## 2021-10-03 NOTE — Hospital Course (Addendum)
Patient is a 70 year old female presenting with dyspnea due to acute exacerbation of heart failure with reduced EF. PMH significant for HFrEF with EF 25-30%, afib, chronic venous stasis, HTN, OSA, thrombocytopenia, and seizure.   Dyspnea, likely 2/2 HFrEF exacerbation Patient presented for increasing dyspnea in the context of medication non-compliance, specifically not taking her torsemide for several days. Labs notable for BNP of 990, unremarkable chest XR. She appeared volume overloaded on exam with notable JVD and crackles on auscultation. She was diuresed with IV Lasix 80 mg IV x1day and then transitioned to her home regimen of oral torsemide 80 mg in the morning and 60 mg in the early afternoon. The diuresis had a positive effect on her dyspnea with the patient reporting no SoB on the day of discharge. Her volume status on exam was improved with reduced lower extremity edema, reduced JVD, and reduced crackles. Of note, despite being placed on her home potassium replacement regimen (120 meq total daily), her potassium dropped to 3.3 on the day of discharge. She was therefore discharged with instructions to take a total of 140 meq until follow up with her PCP on the 28th (40 meq TID and 20 meq at bedtime). Also of note, patient had a repeat echo performed, showing EF of 20-25%, moderate LA dilation, moderately reduced RV function, severe RA dilation.   Afib and Hx of tachy-brady syndrome s/p pacemaker Irregular HR from 50-70 throughout admission with some runs of SVT (patient asymptomatic). She was continued on her home amio and edoxaban. She had her pacer interrogated by cardiology and it was determined that it was working properly.   Hypertension Patient maintained low-normal blood pressure throughout admission, her home losartan was held but her home spironolactone was continued.   Thrombocytopenia Plts low at 118 on admission. Stable throughout the stay. Discahrge plts of 110. Unknown etiology  given patient has no known Hx of liver disease or marrow disease.   Hx of seizure Continued on home phenobarb. No recurrence during this admission.   OSA Patient was maintained on home O2, 2L at night. Patient had an ambulatory desat to 84% and was provided O2 at discharge.     Follow up recommendations: Obtain BMP and re-assess need for oral potassium replacement.  Consider bisphosphonate therapy given patient has an Hx of radial fragility fracture and therefore meets criteria for osteoporosis.  Ensure close follow up with cardiology given patient's Hx of HFrEF and afib/tachy-brady syndrome.

## 2021-10-03 NOTE — ED Notes (Signed)
Admitting at bedside 

## 2021-10-03 NOTE — ED Notes (Signed)
Breakfast Order placed ?

## 2021-10-03 NOTE — Evaluation (Signed)
Physical Therapy Evaluation Patient Details Name: Misty Gray MRN: [id number] DOB: [birth year] Today's Date: 10/03/2021  History of Present Illness  Pt is a 70 y/o female admitted 10/18 secondary to SOB. Thought to be secondary to CHF exacerbation. PMH includes CHF, HTN, a fib, CVA, seizures, SSS s/p pacemaker.  Clinical Impression  Pt admitted secondary to problem above with deficits below. Pt requiring min A for bed mobility and to perform transfers with cane. Educated about using RW at d/c to increase safety and stability. Pt adamant about returning home at d/c, so recommending HHPT to address current deficits. Will continue to follow acutely.        Recommendations for follow up therapy are one component of a multi-disciplinary discharge planning process, led by the attending physician.  Recommendations may be updated based on patient status, additional functional criteria and insurance authorization.  Follow Up Recommendations Home health PT;Supervision for mobility/OOB (pt refusing SNF)    Equipment Recommendations  3in1 (PT)    Recommendations for Other Services       Precautions / Restrictions Precautions Precautions: Fall Precaution Comments: Reports falls at home Restrictions Weight Bearing Restrictions: No      Mobility  Bed Mobility Overal bed mobility: Needs Assistance Bed Mobility: Supine to Sit;Sit to Supine     Supine to sit: Min assist Sit to supine: Min assist   General bed mobility comments: Min A for trunk and LE assist.    Transfers Overall transfer level: Needs assistance Equipment used: Straight cane Transfers: Sit to/from [employer] Sit to Stand: Min assist Stand pivot transfers: Min assist       General transfer comment: Requiring min A for lift assist and steadying to stand and transfer to/from chair in ED. Pt reports feeling more unsteady. Educated about using RW at home to increase safety.  Ambulation/Gait                 Stairs            Wheelchair Mobility    Modified Rankin (Stroke Patients Only)       Balance Overall balance assessment: Needs assistance Sitting-balance support: No upper extremity supported;Feet supported Sitting balance-Leahy Scale: Fair     Standing balance support: Single extremity supported;During functional activity Standing balance-Leahy Scale: Poor Standing balance comment: Reliant on UE and external support                             Pertinent Vitals/Pain Pain Assessment: Faces Faces Pain Scale: Hurts little more Pain Location: LLE Pain Descriptors / Indicators: Cramping Pain Intervention(s): Limited activity within patient's tolerance;Monitored during session;Repositioned    Home Living Family/patient expects to be discharged to:: Private residence Living Arrangements: Children;Spouse/significant other Available Help at Discharge: Family;Available 24 hours/day Type of Home: House Home Access: Stairs to enter Entrance Stairs-Rails: Right Entrance Stairs-Number of Steps: 3 Home Layout: One level Home Equipment: Cane - single point;Walker - 2 wheels;Walker - 4 wheels Additional Comments: Husband is in the hospital as well    Prior Function Level of Independence: Independent with assistive device(s)         Comments: Reports using cane for ambulation. Does report she has had a fall in tub     Hand Dominance        Extremity/Trunk Assessment   Upper Extremity Assessment Upper Extremity Assessment: Defer to OT evaluation    Lower Extremity Assessment Lower Extremity Assessment: Generalized weakness (increased redness and  swelling bilaterally)    Cervical / Trunk Assessment Cervical / Trunk Assessment: Normal  Communication   Communication: Other (comment) (mumbled/garbled speech at times)  Cognition Arousal/Alertness: Awake/alert Behavior During Therapy: WFL for tasks assessed/performed Overall Cognitive Status:  No family/caregiver present to determine baseline cognitive functioning                                 General Comments: Likely close to baseline      General Comments      Exercises     Assessment/Plan    PT Assessment Patient needs continued PT services  PT Problem List Decreased strength;Decreased activity tolerance;Decreased balance;Decreased mobility;Decreased knowledge of use of DME;Decreased knowledge of precautions;Decreased safety awareness       PT Treatment Interventions DME instruction;Gait training;Stair training;Functional mobility training;Therapeutic activities;Therapeutic exercise;Balance training;Patient/family education    PT Goals (Current goals can be found in the Care Plan section)  Acute Rehab PT Goals Patient Stated Goal: to go home PT Goal Formulation: With patient Time For Goal Achievement: 10/17/21 Potential to Achieve Goals: Fair    Frequency Min 3X/week   Barriers to discharge        Co-evaluation               AM-PAC PT "6 Clicks" Mobility  Outcome Measure Help needed turning from your back to your side while in a flat bed without using bedrails?: A Little Help needed moving from lying on your back to sitting on the side of a flat bed without using bedrails?: A Little Help needed moving to and from a bed to a chair (including a wheelchair)?: A Little Help needed standing up from a chair using your arms (e.g., wheelchair or bedside chair)?: A Little Help needed to walk in hospital room?: A Little Help needed climbing 3-5 steps with a railing? : A Lot 6 Click Score: 17    End of Session Equipment Utilized During Treatment: Gait belt Activity Tolerance: Patient limited by fatigue Patient left: in bed;with call bell/phone within reach (on stretcher in ED) Nurse Communication: Mobility status PT Visit Diagnosis: Unsteadiness on feet (R26.81);Muscle weakness (generalized) (M62.81);Repeated falls (R29.6);History of falling  (Z91.81)    Time: 7035-0093 PT Time Calculation (min) (ACUTE ONLY): 23 min   Charges:   PT Evaluation $PT Eval Moderate Complexity: 1 Mod PT Treatments $Therapeutic Activity: 8-22 mins        Cindee Salt, DPT  Acute Rehabilitation Services  Pager: [phone] Office: [phone]   Lehman Prom 10/03/2021, 4:57 PM

## 2021-10-03 NOTE — ED Notes (Signed)
Pt states as she was walking she felt shob and started coughing. O2 while ambulating 86% RA. HR 113. Patient took a few steps outside the room. Once hooked back up patient back to 100% RA HR 78.

## 2021-10-03 NOTE — ED Notes (Signed)
[place] sandwich and water ok to give per [employer]

## 2021-10-03 NOTE — Progress Notes (Addendum)
FPTS Brief Progress Note  S:Patient was sleeping comfortably when I arrived at bed side. I didn't wake her up to avoid disrupting her sleep.    O: BP 99/64   Pulse 63   Temp 98.2 F (36.8 C) (Oral)   Resp 18   Ht 4\' 9"  (1.448 m)   Wt 77.7 kg   SpO2 95%   BMI 37.09 kg/m   General: Asleep, NAD Respiratory: non labored breathing on room air and sating in the high 90s.   A/P: CHF exacerbation 2/2 to medication non compliance  - Orders reviewed. Labs for AM ordered, which was adjusted as needed.   , MD 10/03/2021, 11:32 PM PGY-1, [facility] Night Resident  Please page [phone] with questions.

## 2021-10-03 NOTE — Progress Notes (Signed)
Family Medicine Teaching Service Daily Progress Note Intern Pager: [phone]  [name] Medical record number: [id number] Date of birth: [birth year] Age: 70 y.o. Gender: female  Primary Care Provider: Latrelle Dodrill, MD Consultants: none Code Status: FULL  Pt Overview and Major Events to Date:  10/19: admitted  Assessment and Plan: Misty Gray is a 70 y.o. female presenting with worsening shortness of breath. PMH is significant for HFrEF 25-30%, A. fib, chronic venous stasis, HTN, OSA, thrombocytopenia and seizure  Dyspnea, likely 2/2 HFrEF exacerbation Echo from 06/2020 with EF of 25-30%, severe pHTN (RVSP of 60) and severe RA enlargement. Home regimen of torsemide 80 mg AM / 60 mg PM and spiro 25 mg, though patient reports non-adherence over the past several days. She came in for worsening dyspnea as well as PND/ortohopnea. On exam the patient has JVD to the midneck, diffuse crackles, and 1+ lower extremity edema (which patient reports is stable). Labs/imaging notable for BNP of 990, CXR with interstitial edema and L pleural effusion. Other differentials include CAP, but negative CXR and lack of systemic symptoms make this unlikely. Cause of exacerbation likely 2/2 non-adherence, no recent dietary changes. Now s/p Lasix 40 mg IV x1 in the ED. Started on 80 mg IV BID for today (320 PO lasix equiv compared to home regimen of 280 Lasix equiv). So far UOP of 550 mL over 12 hrs. SBP from 93-115, MAPs from 76-93. Cr of 0.8. K of 3.2. Patient on 120 meq daily at home on top of spiro and losartan.  -80 mg IV Lasix BID, reassess UOP in the afternoon and potentially skip evening dose -Anticipate transition to oral regimen tomorrow -Kcl 40 meq PO x2  -Check K at 6 PM -f/u echo -f/u TSH -Strict I's/O's -Daily weights -Contact son to help with medications  Afib History of tachy-brady syndrome s/p pacemaker. EKG on admission showed AFIB. On exam she has an irregular rhythm with  rates from 56-72. On home medication of amiodarone 100 mg daily and edoxaban 60 mg daily. -Continue home medications -K>4, mg>2   Hypertension Patient with some soft BP. Will need to watch closely with diuresis.Hoem meds of losartan 12.5 mg daily and spiro 25 mg daily.  -Hold home losartan -Continue home spiro 25 mg daily  Thrombocytopenia Patient's platelet on admission was 118. Down to 100 this AM. This appears to be a chronic in nature for patient. Will recommend she follow up outpatient with her PCP for work up or management.  -Daily Lab: CBC  -Follow up out patient with PCP -Discontinue Lovenox if Plt <50k  Chronic venous stasis Patient has a history of venous stasis dermatitis. She said she has had those for years. On exam she has erythema to the mid shin on LE bilaterally. LE were warm to touch with +2 pedal pulse bilaterally. Her WBC 3.8; low suspicion for cellulitis with patient being afebrile.  Will consider cellulitis work up and abx treatment should patient become febrile or develop leukocytosis. -encourage patient to keep legs elevated when possible.  Hx of seizure On home medication of phenobarbital 64.8 mg twice daily. States she had a "small-mini one" two days ago. She forgot to take her medication while taking care of her husband. Denies tremors, she felt like she was drunk with the room spinning and had to hold on to a furniture to prevent falling. Denies LOC. Does not appear to be a true seizure.   -Continue home phenobarbital -phenobarb level  OSA CPAP  at home at night -ordered CPAP   FEN/GI: heart healthy diet PPx: continue home edoxaban Dispo:TBD, patient to work with PT/OT   Subjective:  On interview this morning patient if fully alert and oriented, very talkative but interruptible. She reports no significant shortness of breath currently and says that she feels well. She is not requiring any oxygen currently though she does report using O2 at home. Denies CP.    Objective: Temp:  [97.9 F (36.6 C)-98 F (36.7 C)] 97.9 F (36.6 C) (10/18 2254) Pulse Rate:  [51-98] 68 (10/19 0600) Resp:  [14-24] 16 (10/19 0600) BP: (90-119)/(52-93) 110/71 (10/19 0600) SpO2:  [60 %-100 %] 93 % (10/19 0600) Weight:  [171 lb 6.4 oz (77.7 kg)] 171 lb 6.4 oz (77.7 kg) (10/18 2254) Physical Exam Vitals reviewed.  Cardiovascular:     Rate and Rhythm: Normal rate. Rhythm irregular.  Pulmonary:     Effort: Pulmonary effort is normal.     Comments: Diffuse crackles Skin:    General: Skin is warm and dry.  Neurological:     Mental Status: She is alert and oriented to person, place, and time.     Laboratory: Recent Labs  Lab 10/02/21 1410 10/03/21 0500  WBC 3.8* 3.9*  HGB 14.2 13.0  HCT 43.5 40.1  PLT 118* 100*   Recent Labs  Lab 10/02/21 1410  NA 137  K 3.9  CL 98  CO2 30  BUN 17  CREATININE 0.90  CALCIUM 9.5  GLUCOSE 98    Imaging/Diagnostic Tests: BNP and CXR as above, echo pending   Carlyn Reichert, MD PGY-1 FPTS Intern pager: [phone], text pages welcome

## 2021-10-03 NOTE — ED Notes (Signed)
Lunch ordered 

## 2021-10-03 NOTE — Plan of Care (Signed)
  Problem: Education: Goal: Ability to demonstrate management of disease process will improve Outcome: Progressing   Problem: Education: Goal: Ability to verbalize understanding of medication therapies will improve Outcome: Progressing   

## 2021-10-03 NOTE — ED Notes (Signed)
Purewick shifted so unable to measure urine output. Patient states she went twice. Cleaned up and pruewick repositioned

## 2021-10-04 ENCOUNTER — Inpatient Hospital Stay (HOSPITAL_COMMUNITY): Payer: Medicare Other

## 2021-10-04 DIAGNOSIS — I5021 Acute systolic (congestive) heart failure: Secondary | ICD-10-CM

## 2021-10-04 DIAGNOSIS — I509 Heart failure, unspecified: Secondary | ICD-10-CM | POA: Diagnosis not present

## 2021-10-04 LAB — BASIC METABOLIC PANEL
Anion gap: 6 (ref 5–15)
BUN: 18 mg/dL (ref 8–23)
CO2: 30 mmol/L (ref 22–32)
Calcium: 9 mg/dL (ref 8.9–10.3)
Chloride: 99 mmol/L (ref 98–111)
Creatinine, Ser: 1.04 mg/dL — ABNORMAL HIGH (ref 0.44–1.00)
GFR, Estimated: 57 mL/min — ABNORMAL LOW (ref >60)
Glucose, Bld: 102 mg/dL — ABNORMAL HIGH (ref 70–99)
Potassium: 3.6 mmol/L (ref 3.5–5.1)
Sodium: 135 mmol/L (ref 135–145)

## 2021-10-04 LAB — ECHOCARDIOGRAM COMPLETE
Height: 57 in
MV M vel: 4.52 m/s
MV Peak grad: 81.7 mmHg
Radius: 0.5 cm
S' Lateral: 5 cm
Weight: 2469.15 oz

## 2021-10-04 LAB — CBC WITH DIFFERENTIAL/PLATELET
Abs Immature Granulocytes: 0 10*3/uL (ref 0.00–0.07)
Basophils Absolute: 0 10*3/uL (ref 0.0–0.1)
Basophils Relative: 0 %
Eosinophils Absolute: 0.1 10*3/uL (ref 0.0–0.5)
Eosinophils Relative: 1 %
HCT: 43.4 % (ref 36.0–46.0)
Hemoglobin: 14.1 g/dL (ref 12.0–15.0)
Immature Granulocytes: 0 %
Lymphocytes Relative: 21 %
Lymphs Abs: 1.1 10*3/uL (ref 0.7–4.0)
MCH: 31.7 pg (ref 26.0–34.0)
MCHC: 32.5 g/dL (ref 30.0–36.0)
MCV: 97.5 fL (ref 80.0–100.0)
Monocytes Absolute: 0.5 10*3/uL (ref 0.1–1.0)
Monocytes Relative: 10 %
Neutro Abs: 3.6 10*3/uL (ref 1.7–7.7)
Neutrophils Relative %: 68 %
Platelets: 145 10*3/uL — ABNORMAL LOW (ref 150–400)
RBC: 4.45 MIL/uL (ref 3.87–5.11)
RDW: 14.1 % (ref 11.5–15.5)
WBC: 5.4 10*3/uL (ref 4.0–10.5)
nRBC: 0 % (ref 0.0–0.2)

## 2021-10-04 LAB — MAGNESIUM: Magnesium: 2 mg/dL (ref 1.7–2.4)

## 2021-10-04 MED ORDER — PERFLUTREN LIPID MICROSPHERE
1.0000 mL | INTRAVENOUS | Status: AC | PRN
  Administered 2021-10-04: 2 mL via INTRAVENOUS
  Filled 2021-10-04: qty 10

## 2021-10-04 MED ORDER — POTASSIUM CHLORIDE 20 MEQ PO PACK
40.0000 meq | PACK | Freq: Three times a day (TID) | ORAL | Status: AC
  Administered 2021-10-04 (×3): 40 meq via ORAL
  Filled 2021-10-04 (×2): qty 2

## 2021-10-04 NOTE — Progress Notes (Signed)
FPTS Interim Progress Note  S: Patient sitting upright in chair in no acute distress. She states she sleeping better the O2 at night therefore the RN placed O2 on patient. States she slept well overnight.   O: BP 109/68 (BP Location: Right Arm)   Pulse 82   Temp 98.8 F (37.1 C) (Oral)   Resp 18   Ht 4\' 9"  (1.448 m)   Wt 70 kg   SpO2 95%   BMI 33.39 kg/m    RESP: satting adequately on room air, in no acute distress   A/P: Patient placed on home 2L at bedtime.   Patient BP were soft overnight however morning BP are better. BP taken while pt is sleeping unsure of cuff position as patient is now in the chair. Defer decision regarding torsemide to morning rounding team.    , DO 10/04/2021, 6:52 AM PGY-3, [facility] Family Medicine Service pager [phone]

## 2021-10-04 NOTE — Progress Notes (Signed)
   10/04/21 0123  Assess: MEWS Score  BP 91/62  Pulse Rate (!) 56  ECG Heart Rate (!) 52  Resp 12  SpO2 99 %  O2 Device Nasal Cannula  O2 Flow Rate (L/min) 2 L/min  Assess: MEWS Score  MEWS Temp 0  MEWS Systolic 1  MEWS Pulse 0  MEWS RR 1  MEWS LOC 0  MEWS Score 2  MEWS Score Color Yellow  Assess: if the MEWS score is Yellow or Red  Were vital signs taken at a resting state? Yes  Focused Assessment No change from prior assessment  Early Detection of Sepsis Score *See Row Information* Low  MEWS guidelines implemented *See Row Information* Yes  Treat  MEWS Interventions Escalated (See documentation below)  Pain Scale 0-10  Pain Score 0  Take Vital Signs  Increase Vital Sign Frequency  Yellow: Q 2hr X 2 then Q 4hr X 2, if remains yellow, continue Q 4hrs  Escalate  MEWS: Escalate Yellow: discuss with charge nurse/RN and consider discussing with provider and RRT  Notify: Charge Nurse/RN  Name of Charge Nurse/RN Notified [place], RN  Date Charge Nurse/RN Notified 10/04/21  Time Charge Nurse/RN Notified 0134  Yellow [place], will reassess and follow MEWS protocol.

## 2021-10-04 NOTE — Progress Notes (Signed)
SATURATION QUALIFICATIONS: (This note is used to comply with regulatory documentation for home oxygen)  Patient Saturations on Room Air at Rest = 98%  Patient Saturations on Room Air while Ambulating = 84%  Patient Saturations on 2 Liters of oxygen while Ambulating = 94%  Please briefly explain why patient needs home oxygen: Patient states she has been wearing oxygen via nasal cannula while she sleeps for the past 3 years.

## 2021-10-04 NOTE — Consult Note (Signed)
   [facility] Inpatient Consult   10/04/2021  HIROKO TREGRE 1951/12/06 [id number] [facility] [[facility]]  Accountable Care Organization [ACO] Patient: [employer] Medicare  Primary Care Provider:  Latrelle Dodrill, MD, [facility] Family [facility]  Patient is currently active with [facility]]  Chronic Care Management team for chronic disease management services.  Patient has been engaged by a [facility] RNCM and Child psychotherapist.  Our community based plan of care has focused on disease management and community resource support.  Please see notes for ongoing chronic care management notes, for details.  Patient has also been followed by the Advanced HF team.  Plan: Continue to follow with Inpatient Transition Of Care [TOC] team member as patient was discussed in unit progression meeting this morning.  Of note, [facility] Care Management services does not replace or interfere with any services that are needed or arranged by inpatient [facility] care management team.  For additional questions or referrals please contact:   Charlesetta Shanks, RN BSN CCM [facility]  [phone] business mobile phone Toll free office [phone]  Fax number: [phone] [email or web address]

## 2021-10-04 NOTE — Progress Notes (Signed)
Physical Therapy Treatment Patient Details Name: Misty Gray MRN: [id number] DOB: 08/12/1951 Today's Date: 10/04/2021   History of Present Illness Pt is a 70 y/o female admitted 10/18 secondary to SOB. Thought to be secondary to CHF exacerbation. PMH includes CHF, HTN, a fib, CVA, seizures, SSS s/p pacemaker.    PT Comments    Pt continues to display balance deficits through bouts of LOB, primarily to the R, needing minA to recover. Pt is at high risk for falls, even when using her SPC, as the 1 UE support is not adequate enough to maintain her balance. Pt with poor awareness into her deficits and safety as she reported that she caught herself during her bouts of LOB this date. However, after extensive discussion and education pt did appear to be in agreement to use her RW or rollator at home. Pt also needing up to minA to negotiate stairs due to her deficits in balance and strength. Will continue to follow acutely. Current recommendations remain appropriate.     Recommendations for follow up therapy are one component of a multi-disciplinary discharge planning process, led by the attending physician.  Recommendations may be updated based on patient status, additional functional criteria and insurance authorization.  Follow Up Recommendations  Home health PT;Supervision for mobility/OOB     Equipment Recommendations  3in1 (PT)    Recommendations for Other Services       Precautions / Restrictions Precautions Precautions: Fall Precaution Comments: Reports falls at home, less resistant to using RW at home this date Restrictions Weight Bearing Restrictions: No     Mobility  Bed Mobility Overal bed mobility: Needs Assistance Bed Mobility: Supine to Sit     Supine to sit: Supervision;HOB elevated     General bed mobility comments: Supervision and extra time to sit up EOB.    Transfers Overall transfer level: Needs assistance Equipment used: None Transfers: Sit to/from  Stand Sit to Stand: Min guard         General transfer comment: Min guard assist for safety pushing up from EOB to come to stand 2x, unsteadiness noted but no LOB.  Ambulation/Gait Ambulation/Gait assistance: Min guard;Min assist Gait Distance (Feet): 250 Feet Assistive device: Straight cane Gait Pattern/deviations: Step-through pattern;Decreased stride length;Trunk flexed;Staggering right Gait velocity: reduced Gait velocity interpretation: <1.31 ft/sec, indicative of household ambulator General Gait Details: Pt with kyphotic posture and downward gaze, cuing pt to look up and around her surroundings. Pt with intermittent staggering/LOB laterally, primarily to her R, needing minA to recover. SPC in R hand. Encouraged pt that the [facility] is not providing adequate stability for her at this time, she is at high risk for falls, and she would benefit from using a rollator or RW due to its large BOS, pt appeared to be in agreement at this time.   Stairs Stairs: Yes Stairs assistance: Min guard;Min assist Stair Management: One rail Right;One rail Left;Step to pattern;Forwards;With cane Number of Stairs: 10 General stair comments: Ascends with L rail and SPC in R hand and descends with R rail and SPC in L hand. Cues for sequencing feet and SPC. Min guard assist to ascend, up to minA to descend due to trunk sway.   Wheelchair Mobility    Modified Rankin (Stroke Patients Only)       Balance Overall balance assessment: Needs assistance Sitting-balance support: No upper extremity supported;Feet supported Sitting balance-Leahy Scale: Good     Standing balance support: No upper extremity supported;Single extremity supported Standing balance-Leahy Scale: Fair Standing  balance comment: Able to stand statically without UE support but instability noted, benefits from at least 1 UE support.                            Cognition Arousal/Alertness: Awake/alert Behavior During Therapy:  WFL for tasks assessed/performed Overall Cognitive Status: No family/caregiver present to determine baseline cognitive functioning                                 General Comments: Likely close to baseline, decreased awareness of safety and deficits. For example, pt with LOB to R needing assistance to recover balance but pt stated she caught herself and did not fall.      Exercises      General Comments        Pertinent Vitals/Pain Pain Assessment: Faces Faces Pain Scale: No hurt Pain Intervention(s): Monitored during session    Home Living                      Prior Function            PT Goals (current goals can now be found in the care plan section) Acute Rehab PT Goals Patient Stated Goal: to go home PT Goal Formulation: With patient Time For Goal Achievement: 10/17/21 Potential to Achieve Goals: Fair Progress towards PT goals: Progressing toward goals    Frequency    Min 3X/week      PT Plan Current plan remains appropriate    Co-evaluation              AM-PAC PT "6 Clicks" Mobility   Outcome Measure  Help needed turning from your back to your side while in a flat bed without using bedrails?: A Little Help needed moving from lying on your back to sitting on the side of a flat bed without using bedrails?: A Little Help needed moving to and from a bed to a chair (including a wheelchair)?: A Little Help needed standing up from a chair using your arms (e.g., wheelchair or bedside chair)?: A Little Help needed to walk in hospital room?: A Little Help needed climbing 3-5 steps with a railing? : A Little 6 Click Score: 18    End of Session Equipment Utilized During Treatment: Gait belt Activity Tolerance: Patient tolerated treatment well Patient left: in bed;with call bell/phone within reach;with bed alarm set;with nursing/sitter in room Nurse Communication: Mobility status PT Visit Diagnosis: Unsteadiness on feet (R26.81);Muscle  weakness (generalized) (M62.81);Repeated falls (R29.6);History of falling (Z91.81);Difficulty in walking, not elsewhere classified (R26.2)     Time: 2595-6387 PT Time Calculation (min) (ACUTE ONLY): 34 min  Charges:  $Gait Training: 23-37 mins                     Raymond Gurney, PT, DPT Acute Rehabilitation Services  Pager: [phone] Office: [phone]    Misty Gray 10/04/2021, 5:34 PM

## 2021-10-04 NOTE — Progress Notes (Addendum)
FMTS Attending Daily Note: Terisa Starr, MD  Team Pager [phone] Pager [phone]  I have seen and examined this patient, reviewed their chart. I have discussed this patient with the resident physician.  I agree with the remainder of the findings, exam, and plan below.   Disposition: Pulse oximetry, stable for discharge when has safe home environment (son helps patient at home)    Family Medicine Teaching Service Daily Progress Note Intern Pager: [phone]  [name] Medical record number: [id number] Date of birth: [birth year] Age: 70 y.o. Gender: female  Primary Care Provider: Latrelle Dodrill, MD Consultants: cards Code Status: FULL  Pt Overview and Major Events to Date:  10/19: admitted  Assessment and Plan: Patient is a 70 year old female presenting with dyspnea due to acute exacerbation of heart failure with reduced EF. PMH significant for HFrEF with EF 25-30%, afib, chronic venous stasis, HTN, OSA, thrombocytopenia, and seizure.   Dyspnea, likely 2/2 HFrEF exacerbation Transition to home regimen of torsemide 80/60 today after 80 mg IV lasix x2 yesterday. SBP from 91-150, MAPs > 70. UOP of 1.4 L (0.9 ml/kg/hr). Appears improved on volume exam this AM: lower JVD, fewer crackles. On home O2 overnight, resting on RA this morning.  -Torsemide 80 mg AM / 60 mg PM today -Kcl 40 meq x3 today (home regimen) given K has dropped to 3.6 again -f/u echo -Contact son for help with medications - Ambulatory pulse oximetry  - Unable to reach son despite multiple attempts today   Afib  Irregular rate, from 50-83 over the past 24 hrs. -Requested pacer interrogation by cards -K>4, mg>2 -Continue home amio 100 mg daily -Continue home edoxaban 60 mg daily - Monitor telemetry-- had brief run of VT. Asymptomat at time (in to see patient)   Hypertension Patient with soft pressures. -Hold home Losartan -Continue home spiro 25 mg daily - Monitor creatinine    Thrombocytopenia Plts of 140 this AM.  -Daily CBC -D/C lovenox if plts below 50  Hx of seizure -On home phenobarb  OSA -On CPAP  FEN/GI: heart healthy diet PPx: edoxaban Dispo: HHPT  Subjective:  On interview this morning, patient is in good spirits and reports her SoB has improved significantly since admission. She reports that she receives home O2 from Dr. Dayna Barker at [facility]  Objective: Temp:  [97.6 F (36.4 C)-98.8 F (37.1 C)] 98.8 F (37.1 C) (10/20 0620) Pulse Rate:  [51-119] 82 (10/20 0620) Resp:  [12-20] 18 (10/20 0620) BP: (89-109)/(54-88) 109/68 (10/20 0620) SpO2:  [90 %-100 %] 95 % (10/20 0620) Weight:  [154 lb 5.2 oz (70 kg)] 154 lb 5.2 oz (70 kg) (10/20 0500) Physical Exam Vitals reviewed.  Cardiovascular:     Rate and Rhythm: Normal rate. Rhythm irregular.  Pulmonary:     Effort: Pulmonary effort is normal.     Breath sounds: Normal breath sounds.  Abdominal:     General: Bowel sounds are normal.     Palpations: Abdomen is soft.     Tenderness: There is no abdominal tenderness.  Skin:    General: Skin is warm and dry.  Neurological:     Mental Status: She is alert.     Laboratory: Recent Labs  Lab 10/02/21 1410 10/03/21 0500  WBC 3.8* 3.9*  HGB 14.2 13.0  HCT 43.5 40.1  PLT 118* 100*   Recent Labs  Lab 10/02/21 1410 10/03/21 0500 10/03/21 1749 10/04/21 0154  NA 137 136  --  135  K  3.9 3.2* 3.8 3.6  CL 98 100  --  99  CO2 30 26  --  30  BUN 17 19  --  18  CREATININE 0.90 0.79  --  1.04*  CALCIUM 9.5 9.1  --  9.0  PROT  --  6.1*  --   --   BILITOT  --  0.7  --   --   ALKPHOS  --  108  --   --   ALT  --  13  --   --   AST  --  20  --   --   GLUCOSE 98 93  --  102*     Imaging/Diagnostic Tests: BMP as above   Carlyn Reichert, MD PGY-1 FPTS Intern pager: [phone], text pages welcome

## 2021-10-04 NOTE — Evaluation (Signed)
Occupational Therapy Evaluation Patient Details Name: Misty Gray MRN: [id number] DOB: 07/18/1951 Today's Date: 10/04/2021   History of Present Illness Pt is a 70 y/o female admitted 10/18 secondary to SOB. Thought to be secondary to CHF exacerbation. PMH includes CHF, HTN, a fib, CVA, seizures, SSS s/p pacemaker.   Clinical Impression   Pt walks with a cane, but endorses falls at home. She functions modified independently at home and is the caregiver of her husband who is currently hospitalized. Pt is likely close to her baseline, currently requiring supervision with RW for mobility and ADL. Will follow acutely. Do not anticipate pt will need post acute OT.      Recommendations for follow up therapy are one component of a multi-disciplinary discharge planning process, led by the attending physician.  Recommendations may be updated based on patient status, additional functional criteria and insurance authorization.   Follow Up Recommendations  No OT follow up    Equipment Recommendations  3 in 1 bedside commode    Recommendations for Other Services       Precautions / Restrictions Precautions Precautions: Fall Precaution Comments: Reports falls at home, resistant to using RW      Mobility Bed Mobility               General bed mobility comments: pt up in chair    Transfers Overall transfer level: Needs assistance Equipment used: Rolling walker (2 wheeled) Transfers: Sit to/from Stand Sit to Stand: Supervision         General transfer comment: no assistance with use of RW    Balance Overall balance assessment: Needs assistance Sitting-balance support: No upper extremity supported;Feet supported Sitting balance-Leahy Scale: Good Sitting balance - Comments: no LOB donning socks   Standing balance support: No upper extremity supported Standing balance-Leahy Scale: Fair Standing balance comment: reliant on RW for dynamic balance, fair static balance                            ADL either performed or assessed with clinical judgement   ADL Overall ADL's : Needs assistance/impaired Eating/Feeding: Independent;Sitting   Grooming: Wash/dry hands;Standing;Supervision/safety   Upper Body Bathing: Set up;Sitting   Lower Body Bathing: Supervison/ safety;Sit to/from stand   Upper Body Dressing : Set up;Sitting   Lower Body Dressing: Supervision/safety;Sit to/from stand Lower Body Dressing Details (indicate cue type and reason): bends over to don socks Toilet Transfer: Supervision/safety;Ambulation;RW   Toileting- Clothing Manipulation and Hygiene: Supervision/safety;Sit to/from stand       Functional mobility during ADLs: Supervision/safety;Rolling walker       Vision Baseline Vision/History: 1 Wears glasses Ability to See in Adequate Light: 0 Adequate Patient Visual Report: No change from baseline       Perception     Praxis      Pertinent Vitals/Pain Pain Assessment: No/denies pain     Hand Dominance Right   Extremity/Trunk Assessment Upper Extremity Assessment Upper Extremity Assessment: Overall WFL for tasks assessed (arthritic changes in hands)   Lower Extremity Assessment Lower Extremity Assessment: Defer to PT evaluation   Cervical / Trunk Assessment Cervical / Trunk Assessment: Kyphotic   Communication Communication Communication: Expressive difficulties (mumbled/garbled speech at times)   Cognition Arousal/Alertness: Awake/alert Behavior During Therapy: WFL for tasks assessed/performed Overall Cognitive Status: No family/caregiver present to determine baseline cognitive functioning  General Comments: Likely close to baseline, decreased awareness of safety and deficits.   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 Does report she has had a fall in tub, sponge bathes now.        OT Problem List: Impaired balance (sitting and/or standing);Decreased activity tolerance;Decreased safety awareness;Decreased knowledge of use of DME or AE      OT Treatment/Interventions: Self-care/ADL training;DME and/or AE instruction;Patient/family education;Balance training    OT Goals(Current goals can be found in the care plan section) Acute Rehab OT Goals Patient Stated Goal: to go home OT Goal Formulation: With patient Time For Goal Achievement: 10/18/21 Potential to Achieve Goals: Good ADL Goals Pt Will Perform Grooming: with modified independence;standing Pt Will Perform Lower Body Bathing: with modified independence;sit to/from stand Pt Will Perform Lower Body Dressing: with modified independence;sit to/from stand Pt Will Transfer to Toilet: with modified independence;ambulating;bedside commode Pt Will Perform Toileting - Clothing Manipulation and hygiene: with modified independence;sit to/from stand Additional ADL Goal #1: Pt will state at least 3 fall prevention strategies as instructed.  OT Frequency: Min 2X/week   Barriers to D/C:            Co-evaluation              AM-PAC OT "6 Clicks" Daily Activity     Outcome Measure Help from another person eating meals?: None Help from  another person taking care of personal grooming?: A Little Help from another person toileting, which includes using toliet, bedpan, or urinal?: A Little Help from another person bathing (including washing, rinsing, drying)?: None Help from another person to put on and taking off regular upper body clothing?: A Little   6 Click Score: 17   End of Session Equipment Utilized During Treatment: Rolling walker Nurse Communication: Mobility status  Activity Tolerance: Patient tolerated treatment well Patient left: in chair;with call bell/phone within reach;with chair alarm set;with nursing/sitter in room  OT Visit Diagnosis: Other abnormalities of gait and mobility (R26.89);Unsteadiness on feet (R26.81) (decreased activity tolerance)                Time: 4332-9518 OT Time Calculation (min): 22 min Charges:  OT General Charges $OT Visit: 1 Visit OT Evaluation $OT Eval Low Complexity: 1 Low  Martie Round, OTR/L Acute Rehabilitation Services Pager: [phone] Office: [phone]   Evern Bio 10/04/2021, 9:41 AM

## 2021-10-05 ENCOUNTER — Other Ambulatory Visit: Payer: Self-pay | Admitting: Family Medicine

## 2021-10-05 DIAGNOSIS — I493 Ventricular premature depolarization: Secondary | ICD-10-CM

## 2021-10-05 DIAGNOSIS — I509 Heart failure, unspecified: Secondary | ICD-10-CM | POA: Diagnosis not present

## 2021-10-05 LAB — CBC
HCT: 39.8 % (ref 36.0–46.0)
Hemoglobin: 13.3 g/dL (ref 12.0–15.0)
MCH: 31.8 pg (ref 26.0–34.0)
MCHC: 33.4 g/dL (ref 30.0–36.0)
MCV: 95.2 fL (ref 80.0–100.0)
Platelets: 110 10*3/uL — ABNORMAL LOW (ref 150–400)
RBC: 4.18 MIL/uL (ref 3.87–5.11)
RDW: 13.9 % (ref 11.5–15.5)
WBC: 4.4 10*3/uL (ref 4.0–10.5)
nRBC: 0 % (ref 0.0–0.2)

## 2021-10-05 LAB — BASIC METABOLIC PANEL
Anion gap: 6 (ref 5–15)
BUN: 19 mg/dL (ref 8–23)
CO2: 30 mmol/L (ref 22–32)
Calcium: 8.8 mg/dL — ABNORMAL LOW (ref 8.9–10.3)
Chloride: 99 mmol/L (ref 98–111)
Creatinine, Ser: 0.85 mg/dL (ref 0.44–1.00)
GFR, Estimated: >60 mL/min (ref >60)
Glucose, Bld: 93 mg/dL (ref 70–99)
Potassium: 3.3 mmol/L — ABNORMAL LOW (ref 3.5–5.1)
Sodium: 135 mmol/L (ref 135–145)

## 2021-10-05 MED ORDER — POTASSIUM CHLORIDE CRYS ER 20 MEQ PO TBCR: 40.0000 meq | EXTENDED_RELEASE_TABLET | Freq: Two times a day (BID) | ORAL | Status: DC

## 2021-10-05 MED ORDER — POTASSIUM CHLORIDE CRYS ER 10 MEQ PO TBCR: EXTENDED_RELEASE_TABLET | ORAL | 6 refills | Status: DC

## 2021-10-05 MED ORDER — POTASSIUM CHLORIDE CRYS ER 20 MEQ PO TBCR
40.0000 meq | EXTENDED_RELEASE_TABLET | Freq: Three times a day (TID) | ORAL | Status: DC
  Administered 2021-10-05: 40 meq via ORAL
  Filled 2021-10-05: qty 2

## 2021-10-05 NOTE — TOC Progression Note (Addendum)
Transition of Care [facility]) - Progression Note    Patient Details  Name: Misty Gray MRN: [id number] Date of Birth: [birth year]  Transition of Care [facility]) CM/SW Contact  Leone Haven, RN Phone Number: 10/05/2021, 2:06 PM  Clinical Narrative:    Patient is for dc today, she will need HHPT, HHRN, and home oxygen, NCM asked patient  who she wanted for home oxygen, she states she does not have a preference.  NCM made referral to [facility] with [employer] for home oxygen and 3 n 1. NCM received call from Huntley Dec with [facility] ( used to be [place] [facility] [facility]) she will let me know if they can take rerferral for HHRN, HHPT.          Expected Discharge Plan and Services                                                 Social Determinants of Health (SDOH) Interventions    Readmission Risk Interventions No flowsheet data found.

## 2021-10-05 NOTE — Progress Notes (Signed)
Future lab order entered.

## 2021-10-05 NOTE — TOC Transition Note (Signed)
Transition of Care [facility]) - CM/SW Discharge Note   Patient Details  Name: Misty Gray MRN: [id number] Date of Birth: 27-Sep-1951  Transition of Care [facility]) CM/SW Contact:  Leone Haven, RN Phone Number: 10/05/2021, 3:01 PM   Clinical Narrative:    Patient is for dc today, NCM offered choice, she is ok with [facility] that used to be [facility], her husband on [address] will also be set up with [facility] per the NCM on that unit.  [name] will also need 3 n 1 and oxygen.  She states she does not have a preference for DME company.  NCM made referral to [facility] with [employer] for home oxygen.  She will have HHRN, HHPT , soc will begin 24 to 48 hrs post dc.     Final next level of care: Home w Home Health Services Barriers to Discharge: No Barriers Identified   Patient Goals and CMS Choice Patient states their goals for this hospitalization and ongoing recovery are:: return home CMS Medicare.gov Compare Post Acute Care list provided to:: Patient Choice offered to / list presented to : Patient  Discharge Placement                       Discharge Plan and Services                DME Arranged: Oxygen, 3-N-1 DME Agency: [facility] Date DME Agency Contacted: 10/05/21 Time DME Agency Contacted: 1501 Representative spoke with at DME Agency: [facility] HH Arranged: RN, PT HH Agency: [facility] Date [facility] Agency Contacted: 10/05/21 Time HH Agency Contacted: 1501 Representative spoke with at [facility] Agency: [facility] Dec  Social Determinants of Health (SDOH) Interventions     Readmission Risk Interventions No flowsheet data found.

## 2021-10-05 NOTE — Discharge Instructions (Addendum)
Dear [name],   Thank you for letting us participate in your care! In this section, you will find a brief hospital admission summary of why you were admitted to the hospital, what happened during your admission, your diagnosis/diagnoses, and recommended follow up.  You were admitted because you were experiencing shortness of breath.  Your testing revealed a worsening of your heart failure.  You were treated with diuretics and the transitioned to your home regimen of diuretics (torsemide).  Your shortness of breath improved and you were discharged from the hospital for meeting this goal.    POST-HOSPITAL & CARE INSTRUCTIONS Please follow up with Dr. Pollie Meyer on 10/28 as listed elsewhere Please let PCP/Specialists know of any changes in medications that were made.  Please see medications section of this packet for any medication changes.   DOCTOR'S APPOINTMENTS & FOLLOW UP See follow up providers section   Thank you for choosing [facility]! Take care and be well!  Family Medicine Teaching Service Inpatient Team [place]  [facility]  [address] [place], [place] [address] [phone]

## 2021-10-05 NOTE — Plan of Care (Signed)
  Problem: Education: Goal: Ability to verbalize understanding of medication therapies will improve Outcome: Adequate for Discharge   Problem: Education: Goal: Ability to demonstrate management of disease process will improve Outcome: Adequate for Discharge

## 2021-10-05 NOTE — Progress Notes (Signed)
FPTS Brief Progress Note  S:Pt sleeping comfortably   O: BP 101/74 (BP Location: Right Arm)   Pulse 65   Temp 98.2 F (36.8 C) (Oral)   Resp 20   Ht 4\' 9"  (1.448 m)   Wt 68.9 kg   SpO2 99%   BMI 32.89 kg/m    RESP" 2 L [place]   A/P: VSS - Orders reviewed. Labs for AM ordered, which was adjusted as needed.    , DO 10/05/2021, 6:05 AM PGY-3, [facility] Night Resident  Please page [phone] with questions.

## 2021-10-05 NOTE — Discharge Summary (Signed)
Family Medicine Teaching [facility] Discharge Summary  [name] Medical record number: [id number] Date of birth: [birth year] Age: 70 y.o. Gender: female Date of Admission: 10/02/2021  Date of Discharge: 10/05/2021 Admitting Physician: Jerre Simon, MD  Primary Care Provider: Latrelle Dodrill, MD Consultants: none  Indication for Hospitalization: dyspnea  Discharge Diagnoses/Problem List:  Dyspnea HFrEF exacerbation Afib Tachy-brady syndrome HTN Thrombocytopenia Hx of seizure OSA  Disposition: home  Discharge Condition: improved, stable  Discharge Exam:  Physical Exam Vitals reviewed.  Cardiovascular:     Rate and Rhythm: Normal rate. Rhythm irregular.     Comments: Lower extremities with chronic venous stasis, edema improved from admission Pulmonary:     Effort: Pulmonary effort is normal.     Comments: Diffuse crackles, improved from admission Abdominal:     General: Bowel sounds are normal.     Palpations: Abdomen is soft.     Tenderness: There is no abdominal tenderness.  Neurological:     Mental Status: She is alert.     Brief Hospital Course:  Patient is a 70 year old female presenting with dyspnea due to acute exacerbation of heart failure with reduced EF. PMH significant for HFrEF with EF 25-30%, afib, chronic venous stasis, HTN, OSA, thrombocytopenia, and seizure.   Dyspnea, likely 2/2 HFrEF exacerbation Patient presented for increasing dyspnea in the context of medication non-compliance, specifically not taking her torsemide for several days. Labs notable for BNP of 990, unremarkable chest XR. She appeared volume overloaded on exam with notable JVD and crackles on auscultation. She was diuresed with IV Lasix 80 mg IV x1day and then transitioned to her home regimen of oral torsemide 80 mg in the morning and 60 mg in the early afternoon. The diuresis had a positive effect on her dyspnea with the patient reporting no SoB on the day of  discharge. Her volume status on exam was improved with reduced lower extremity edema, reduced JVD, and reduced crackles. Of note, despite being placed on her home potassium replacement regimen (120 meq total daily), her potassium dropped to 3.3 on the day of discharge. She was therefore discharged with instructions to take a total of 140 meq until follow up with her PCP on the 28th (40 meq TID and 20 meq at bedtime). Also of note, patient had a repeat echo performed, showing EF of 20-25%, moderate LA dilation, moderately reduced RV function, severe RA dilation.   Afib and Hx of tachy-brady syndrome s/p pacemaker Irregular HR from 50-70 throughout admission with some runs of SVT (patient asymptomatic). She was continued on her home amio and edoxaban. She had her pacer interrogated by cardiology and it was determined that it was working properly.   Hypertension Patient maintained low-normal blood pressure throughout admission, her home losartan was held but her home spironolactone was continued.   Thrombocytopenia Plts low at 118 on admission. Stable throughout the stay. Discahrge plts of 110. Unknown etiology given patient has no known Hx of liver disease or marrow disease.   Hx of seizure Continued on home phenobarb. No recurrence during this admission.   OSA Patient was maintained on home O2, 2L at night. Patient had an ambulatory desat to 84% and was provided O2 at discharge.     Follow up recommendations: Obtain BMP and re-assess need for oral potassium replacement.  Consider bisphosphonate therapy given patient has an Hx of radial fragility fracture and therefore meets criteria for osteoporosis.  Ensure close follow up with cardiology given patient's Hx of  HFrEF and afib/tachy-brady syndrome.     Significant Procedures: echo  Significant Labs and Imaging:  Recent Labs  Lab 10/03/21 0500 10/04/21 0957 10/05/21 0352  WBC 3.9* 5.4 4.4  HGB 13.0 14.1 13.3  HCT 40.1 43.4 39.8  PLT  100* 145* 110*   Recent Labs  Lab 10/02/21 1410 10/03/21 0500 10/03/21 1749 10/04/21 0154 10/05/21 0352  NA 137 136  --  135 135  K 3.9 3.2*   < > 3.6 3.3*  CL 98 100  --  99 99  CO2 30 26  --  30 30  GLUCOSE 98 93  --  102* 93  BUN 17 19  --  18 19  CREATININE 0.90 0.79  --  1.04* 0.85  CALCIUM 9.5 9.1  --  9.0 8.8*  MG  --  2.0  --  2.0  --   ALKPHOS  --  108  --   --   --   AST  --  20  --   --   --   ALT  --  13  --   --   --   ALBUMIN  --  2.7*  --   --   --    < > = values in this interval not displayed.    Results/Tests Pending at Time of Discharge: NA  Discharge Medications:  Allergies as of 10/05/2021       Reactions   Aspirin Hives, Rash   Penicillins Rash, Other (See Comments)   Has patient had a PCN reaction causing immediate rash, facial/tongue/throat swelling, SOB or lightheadedness with hypotension: Yes Has patient had a PCN reaction causing severe rash involving mucus membranes or skin necrosis: No Has patient had a PCN reaction that required hospitalization No Has patient had a PCN reaction occurring within the last 10 years: Yes If all of the above answers are "NO", then may proceed with Cephalosporin use. Patient has tolerated cephalosporins in 2017.   Tape Other (See Comments)   NO PAPER TAPE-MUST BE MEDICAL TAPE - unknown reaction NO PAPER TAPE-MUST BE MEDICAL TAPE - unknown reaction   Wool Alcohol [lanolin] Hives   Amoxicillin Rash   Patient has tolerated cephalosporins   Ampicillin Itching, Rash   Patient has tolerated cephalosporins        Medication List     STOP taking these medications    HYDROcodone-acetaminophen 5-325 MG tablet Commonly known as: NORCO/VICODIN   losartan 25 MG tablet Commonly known as: COZAAR       TAKE these medications    amiodarone 200 MG tablet Commonly known as: PACERONE Take 0.5 tablets (100 mg total) by mouth daily.   D3-1000 25 MCG (1000 UT) tablet Generic drug: Cholecalciferol TAKE 1 TABLET  BY MOUTH DAILY (AM) What changed: See the new instructions.   edoxaban 60 MG Tabs tablet Commonly known as: Savaysa Take 60 mg by mouth daily.   fluticasone 50 MCG/ACT nasal spray Commonly known as: FLONASE Place 2 sprays into both nostrils daily as needed. For congestion.   omeprazole 20 MG capsule Commonly known as: PRILOSEC Take 1 capsule (20 mg total) by mouth daily. TAKE ONE CAPSULE BY MOUTH EVERY MORNING What changed: additional instructions   PHENobarbital 64.8 MG tablet Commonly known as: LUMINAL TAKE ONE TABLET BY MOUTH TWICE DAILY (AM+BEDTIME) What changed: See the new instructions.   potassium chloride 10 MEQ tablet Commonly known as: KLOR-CON TAKE 4 TABLETS BY MOUTH THREE TIMES A DAY WITH MEALS AND 2 TABLETS AT  BEDTIME What changed: additional instructions   spironolactone 25 MG tablet Commonly known as: ALDACTONE Take 1 tablet (25 mg total) by mouth daily.   torsemide 20 MG tablet Commonly known as: DEMADEX TAKE 4 TABLETS BY MOUTH EVERY MORNING AND 3 TABLETS EVERY EVENING What changed: See the new instructions.   triamcinolone cream 0.1 % Commonly known as: KENALOG Apply 1 application topically 2 (two) times daily. What changed:  when to take this reasons to take this               Durable Medical Equipment  (From admission, onward)           Start     Ordered   10/05/21 1413  For home use only DME 3 n 1  Once        10/05/21 1412   10/05/21 1108  For home use only DME oxygen  Once       Question Answer Comment  Length of Need Lifetime   Mode or (Route) Nasal cannula   Liters per Minute 2   Frequency Continuous (stationary and portable oxygen unit needed)   Oxygen delivery system Gas      10/05/21 1109            Discharge Instructions: Please refer to Patient Instructions section of EMR for full details.  Patient was counseled important signs and symptoms that should prompt return to medical care, changes in medications,  dietary instructions, activity restrictions, and follow up appointments.   Follow-Up Appointments:  Follow-up Information     Latrelle Dodrill, MD Follow up on 10/12/2021.   Specialty: Family Medicine Why: Keep appointment Contact information: [address] [place] [address] [phone]         [facility]. Go on 10/08/2021.   Why: Appointment for lab draw at 930 am Contact information: [address] [place] [address] [phone]        [employer] Follow up.   Why: home oxygen, 3 n 1 Contact information: [phone]        [facility] Follow up.   Specialty: Home Health Services Why: HHRN,HHPT,  new name is Advice worker information: [address] [place] [address] [phone]                 Carlyn Reichert, MD PGY-1 FPTS Intern pager: [phone], text pages welcome

## 2021-10-06 DIAGNOSIS — I509 Heart failure, unspecified: Secondary | ICD-10-CM | POA: Diagnosis not present

## 2021-10-08 ENCOUNTER — Other Ambulatory Visit: Payer: Medicare Other

## 2021-10-08 ENCOUNTER — Other Ambulatory Visit (HOSPITAL_COMMUNITY): Payer: Self-pay

## 2021-10-08 NOTE — Progress Notes (Signed)
Paramedicine Encounter    Patient ID: Misty Gray, female    DOB: 1951/06/05, 70 y.o.   MRN: [id number]   Patient Care Team: Latrelle Dodrill, MD as PCP - General (Family Medicine) [name], MD (Cardiology) Burna Sis, LCSW as Social Worker (Licensed Clinical Social Worker) [name], RN as Case Manager  Patient Active Problem List   Diagnosis Date Noted   Heart failure (HCC) 10/03/2021   Stasis dermatitis of both legs 08/05/2021   Hand injury, left, initial encounter 09/22/2020   CHF exacerbation (HCC) 07/12/2020   Hospital discharge follow-up 11/17/2019   Onychomycosis 11/17/2019   Abdominal wall hernia 11/05/2017   Anticoagulated 11/05/2017   Rash and nonspecific skin eruption 09/15/2017   Ventral hernia without obstruction or gangrene 08/15/2017   Chronic venous stasis dermatitis 12/26/2016   Urinary tract infection without hematuria    Frequent PVCs    Hypokalemia    Housing problems 11/23/2016   Chronic atrial fibrillation (HCC) 11/01/2016   Hypotension 10/31/2016   Healthcare maintenance 08/12/2016   Heme positive stool 08/09/2016   Intertrigo 08/09/2016   Chronic anticoagulation 04/26/2016   Chronic venous insufficiency    Cellulitis 09/07/2015   Breast mass, left 10/05/2014   Osteopenia 09/15/2014   Hypertension 01/04/2014   Chronic systolic dysfunction of left ventricle 05/05/2013   At high risk for falls 02/11/2013   Vitamin D deficiency 07/15/2012   Sinoatrial node dysfunction (HCC) 05/08/2011   Pacemaker 08/10/2009   Allergic rhinitis 05/31/2008   Morbid obesity (HCC) 02/12/2007   GASTROESOPHAGEAL REFLUX, NO ESOPHAGITIS 02/12/2007   Female stress incontinence 02/12/2007   Seizure disorder (HCC) 02/12/2007   OSA (obstructive sleep apnea) 02/12/2007    Current Outpatient Medications:    amiodarone (PACERONE) 200 MG tablet, Take 0.5 tablets (100 mg total) by mouth daily., Disp: 45 tablet, Rfl: 3   D3-1000 25 MCG (1000 UT) tablet, TAKE 1  TABLET BY MOUTH DAILY (AM) (Patient taking differently: Take 1,000 Units by mouth daily.), Disp: 28 tablet, Rfl: 1   edoxaban (SAVAYSA) 60 MG TABS tablet, Take 60 mg by mouth daily., Disp: 30 tablet, Rfl: 11   fluticasone (FLONASE) 50 MCG/ACT nasal spray, Place 2 sprays into both nostrils daily as needed. For congestion., Disp: 16 g, Rfl: 1   omeprazole (PRILOSEC) 20 MG capsule, Take 1 capsule (20 mg total) by mouth daily. TAKE ONE CAPSULE BY MOUTH EVERY MORNING (Patient taking differently: Take 20 mg by mouth daily.), Disp: 30 capsule, Rfl: 1   PHENobarbital (LUMINAL) 64.8 MG tablet, TAKE ONE TABLET BY MOUTH TWICE DAILY (AM+BEDTIME) (Patient taking differently: Take 64.8 mg by mouth 2 (two) times daily.), Disp: 56 tablet, Rfl: 0   potassium chloride (KLOR-CON) 10 MEQ tablet, TAKE 4 TABLETS BY MOUTH THREE TIMES A DAY WITH MEALS AND 2 TABLETS AT BEDTIME, Disp: 360 tablet, Rfl: 6   spironolactone (ALDACTONE) 25 MG tablet, Take 1 tablet (25 mg total) by mouth daily., Disp: 30 tablet, Rfl: 4   torsemide (DEMADEX) 20 MG tablet, TAKE 4 TABLETS BY MOUTH EVERY MORNING AND 3 TABLETS EVERY EVENING (Patient taking differently: Take 60-80 mg by mouth See admin instructions. 80 mg in the morning 60 mg in the evenin), Disp: 210 tablet, Rfl: 5   triamcinolone cream (KENALOG) 0.1 %, Apply 1 application topically 2 (two) times daily. (Patient taking differently: Apply 1 application topically 2 (two) times daily as needed (rash).), Disp: 30 g, Rfl: 1 Allergies  Allergen Reactions   Aspirin Hives and Rash   Penicillins  Rash and Other (See Comments)    Has patient had a PCN reaction causing immediate rash, facial/tongue/throat swelling, SOB or lightheadedness with hypotension: Yes Has patient had a PCN reaction causing severe rash involving mucus membranes or skin necrosis: No Has patient had a PCN reaction that required hospitalization No Has patient had a PCN reaction occurring within the last 10 years: Yes If all of  the above answers are "NO", then may proceed with Cephalosporin use.  Patient has tolerated cephalosporins in 2017.   Tape Other (See Comments)    NO PAPER TAPE-MUST BE MEDICAL TAPE - unknown reaction NO PAPER TAPE-MUST BE MEDICAL TAPE - unknown reaction   Wool Alcohol [Lanolin] Hives   Amoxicillin Rash    Patient has tolerated cephalosporins   Ampicillin Itching and Rash    Patient has tolerated cephalosporins      Social History   Socioeconomic History   Marital status: Married    Spouse name: [name]   Number of children: 4   Years of education: 12   Highest education level: Not on file  Occupational History   Occupation: disabled  Tobacco Use   Smoking status: Former    Packs/day: 1.00    Years: 7.00    Pack years: 7.00    Types: Cigarettes    Quit date: 03/16/1969    Years since quitting: 52.6   Smokeless tobacco: Never  Vaping Use   Vaping Use: Never used  Substance and Sexual Activity   Alcohol use: No   Drug use: No   Sexual activity: Not Currently    Birth control/protection: Post-menopausal  Other Topics Concern   Not on file  Social History Narrative   Lives with husband [name] lives with them as do his 2 children   Daughter, [name], and her 3 children live in [place]   Son -  [name] is incarcerated   Nephew, Afton Lavalle lives with them   Daughter, [name], in [place] - [name]   Moved June 2013 to a better home on [address]      Lives with husband, son, grandson, granddaughter, nephew.   Social Determinants of Health   Financial Resource Strain: Not on file  Food Insecurity: Not on file  Transportation Needs: Not on file  Physical Activity: Not on file  Stress: Not on file  Social Connections: Not on file  Intimate Partner Violence: Not on file    Physical Exam      Future Appointments  Date Time Provider Department Center  10/12/2021  9:30 AM [facility]-CASE MANAGER [facility]  10/16/2021  9:50 AM Latrelle Dodrill, MD [facility]  12/20/2021  7:00 AM [facility] DEVICE REMOTES [name]  03/21/2022  7:00 AM [facility] DEVICE REMOTES [name]  06/20/2022  7:00 AM [facility] DEVICE REMOTES [name]  09/19/2022  7:00 AM [facility] DEVICE REMOTES [name]  12/19/2022  7:00 AM [facility] DEVICE REMOTES [name]  03/20/2023  7:00 AM [facility] DEVICE REMOTES [name]  06/20/2023  7:05 AM [facility] DEVICE REMOTES [name]    BP 104/80   Pulse (!) 56   Wt 162 lb (73.5 kg)   BMI 35.06 kg/m   Weight yesterday-? Last visit weight-166 @ PCP visit   Pt recently d/c from hosp. Prior to admission, she was tending to her husband and she forgot to take her meds about 3 days of meds.  Pt does have her 02.  Her  losartan was d/c.  Her potassium was increased, torsemide dose staying the same.  She has bubble packs, I fixed her up for a week and will ensure the pharmacy has new med changes updated to her pill  box which is due next week.   She still is sob upon exertion. Better since she is using 02. No issues using the concentrator like the last one and no issue with payments.    Kerry Hough, EMT-Paramedic [phone] [facility] Paramedic  10/08/21

## 2021-10-09 ENCOUNTER — Other Ambulatory Visit (HOSPITAL_COMMUNITY): Payer: Self-pay | Admitting: *Deleted

## 2021-10-09 MED ORDER — POTASSIUM CHLORIDE CRYS ER 10 MEQ PO TBCR: EXTENDED_RELEASE_TABLET | ORAL | 6 refills | Status: DC

## 2021-10-10 DIAGNOSIS — I482 Chronic atrial fibrillation, unspecified: Secondary | ICD-10-CM | POA: Diagnosis not present

## 2021-10-10 DIAGNOSIS — I5023 Acute on chronic systolic (congestive) heart failure: Secondary | ICD-10-CM | POA: Diagnosis not present

## 2021-10-10 DIAGNOSIS — I495 Sick sinus syndrome: Secondary | ICD-10-CM | POA: Diagnosis not present

## 2021-10-10 DIAGNOSIS — I110 Hypertensive heart disease with heart failure: Secondary | ICD-10-CM | POA: Diagnosis not present

## 2021-10-10 DIAGNOSIS — I872 Venous insufficiency (chronic) (peripheral): Secondary | ICD-10-CM | POA: Diagnosis not present

## 2021-10-12 ENCOUNTER — Ambulatory Visit: Payer: Medicare Other

## 2021-10-12 NOTE — Patient Instructions (Signed)
Visit Information  Misty Gray  it was nice speaking with you. Please call me directly [phone] if you have questions about the goals we discussed.   Goals Addressed               This Visit's Progress     My legs have been swollen for quite some time (pt-stated)        Timeframe:  Long-Range Goal Priority:  High Start Date:      08/29/21                       Expected End Date:   11/14/21                Patient Goals/Self-Care Activities: Patient will self administer medications as prescribed Patient will attend all scheduled provider appointments Patient will call pharmacy for medication refills Patient will continue to perform ADL's independently Patient will continue to perform IADL's independently Patient will elevate her legs when sitting Patient/family will keep their hands clean if they have to medicate her legs with an ointment.         Track and Manage Symptoms-Heart Failure        Timeframe:  Long-Range Goal Priority:  High Start Date:    11/12/21                         Expected End Date:   11/14/21                      - follow rescue plan if symptoms flare-up - track symptoms and what helps feel better or worse    Why is this important?   You will be able to handle your symptoms better if you keep track of them.  Making some simple changes to your lifestyle will help.  Eating healthy is one thing you can do to take good care of yourself.    Notes:         The patient verbalized understanding of instructions, educational materials, and care plan provided today and declined offer to receive copy of patient instructions, educational materials, and care plan.   Follow up Plan: Patient would like continued follow-up.  CCM RNCM will outreach the patient within the next 4 weeks.  Patient will call office if needed prior to next encounter  [name], RN  [phone]

## 2021-10-12 NOTE — Chronic Care Management (AMB) (Signed)
Chronic Care Management   CCM RN Visit Note  10/12/2021 Name: Misty Gray MRN: [id number] DOB: 11/04/1951  Subjective: Misty Gray is a 70 y.o. year old female who is a primary care patient of Pollie Meyer Estevan Ryder, MD. The care management team was consulted for assistance with disease management and care coordination needs.    Engaged with patient by telephone for follow up visit in response to provider referral for case management and/or care coordination services.   Consent to Services:  The patient was given information about Chronic Care Management services, agreed to services, and gave verbal consent prior to initiation of services.  Please see initial visit note for detailed documentation.   Patient agreed to services and verbal consent obtained.    Assessment:  Misty Gray  continues to experience difficulty with cellulitis in her legs.. See Care Plan below for interventions and patient self-care actives. Follow up Plan: Patient would like continued follow-up.  CCM RNCM will outreach the patient within the next 4 weeks.  Patient will call office if needed prior to next encounter  Review of patient past medical history, allergies, medications, health status, including review of consultants reports, laboratory and other test data, was performed as part of comprehensive evaluation and provision of chronic care management services.   SDOH (Social Determinants of Health) assessments and interventions performed:    CCM Care Plan  Allergies  Allergen Reactions   Aspirin Hives and Rash   Penicillins Rash and Other (See Comments)    Has patient had a PCN reaction causing immediate rash, facial/tongue/throat swelling, SOB or lightheadedness with hypotension: Yes Has patient had a PCN reaction causing severe rash involving mucus membranes or skin necrosis: No Has patient had a PCN reaction that required hospitalization No Has patient had a PCN reaction occurring within the last 10  years: Yes If all of the above answers are "NO", then may proceed with Cephalosporin use.  Patient has tolerated cephalosporins in 2017.   Tape Other (See Comments)    NO PAPER TAPE-MUST BE MEDICAL TAPE - unknown reaction NO PAPER TAPE-MUST BE MEDICAL TAPE - unknown reaction   Wool Alcohol [Lanolin] Hives   Amoxicillin Rash    Patient has tolerated cephalosporins   Ampicillin Itching and Rash    Patient has tolerated cephalosporins    Outpatient Encounter Medications as of 10/12/2021  Medication Sig   amiodarone (PACERONE) 200 MG tablet Take 0.5 tablets (100 mg total) by mouth daily.   D3-1000 25 MCG (1000 UT) tablet TAKE 1 TABLET BY MOUTH DAILY (AM) (Patient taking differently: Take 1,000 Units by mouth daily.)   edoxaban (SAVAYSA) 60 MG TABS tablet Take 60 mg by mouth daily.   fluticasone (FLONASE) 50 MCG/ACT nasal spray Place 2 sprays into both nostrils daily as needed. For congestion.   omeprazole (PRILOSEC) 20 MG capsule Take 1 capsule (20 mg total) by mouth daily. TAKE ONE CAPSULE BY MOUTH EVERY MORNING (Patient taking differently: Take 20 mg by mouth daily.)   PHENobarbital (LUMINAL) 64.8 MG tablet TAKE ONE TABLET BY MOUTH TWICE DAILY (AM+BEDTIME) (Patient taking differently: Take 64.8 mg by mouth 2 (two) times daily.)   potassium chloride (KLOR-CON) 10 MEQ tablet TAKE 4 TABLETS BY MOUTH THREE TIMES A DAY WITH MEALS AND 2 TABLETS AT BEDTIME   spironolactone (ALDACTONE) 25 MG tablet Take 1 tablet (25 mg total) by mouth daily.   torsemide (DEMADEX) 20 MG tablet TAKE 4 TABLETS BY MOUTH EVERY MORNING AND 3 TABLETS EVERY EVENING (Patient taking  differently: Take 60-80 mg by mouth See admin instructions. 80 mg in the morning 60 mg in the evenin)   triamcinolone cream (KENALOG) 0.1 % Apply 1 application topically 2 (two) times daily. (Patient taking differently: Apply 1 application topically 2 (two) times daily as needed (rash).)   No facility-administered encounter medications on file as  of 10/12/2021.    Patient Active Problem List   Diagnosis Date Noted   Heart failure (HCC) 10/03/2021   Stasis dermatitis of both legs 08/05/2021   Hand injury, left, initial encounter 09/22/2020   CHF exacerbation (HCC) 07/12/2020   Hospital discharge follow-up 11/17/2019   Onychomycosis 11/17/2019   Abdominal wall hernia 11/05/2017   Anticoagulated 11/05/2017   Rash and nonspecific skin eruption 09/15/2017   Ventral hernia without obstruction or gangrene 08/15/2017   Chronic venous stasis dermatitis 12/26/2016   Urinary tract infection without hematuria    Frequent PVCs    Hypokalemia    Housing problems 11/23/2016   Chronic atrial fibrillation (HCC) 11/01/2016   Hypotension 10/31/2016   Healthcare maintenance 08/12/2016   Heme positive stool 08/09/2016   Intertrigo 08/09/2016   Chronic anticoagulation 04/26/2016   Chronic venous insufficiency    Cellulitis 09/07/2015   Breast mass, left 10/05/2014   Osteopenia 09/15/2014   Hypertension 01/04/2014   Chronic systolic dysfunction of left ventricle 05/05/2013   At high risk for falls 02/11/2013   Vitamin D deficiency 07/15/2012   Sinoatrial node dysfunction (HCC) 05/08/2011   Pacemaker 08/10/2009   Allergic rhinitis 05/31/2008   Morbid obesity (HCC) 02/12/2007   GASTROESOPHAGEAL REFLUX, NO ESOPHAGITIS 02/12/2007   Female stress incontinence 02/12/2007   Seizure disorder (HCC) 02/12/2007   OSA (obstructive sleep apnea) 02/12/2007    Conditions to be addressed/monitored:CHF and Cellulitis  Care Plan : RN Care Plan  Updates made by [name], RN since 10/12/2021 12:00 AM     Problem: Impaied Tissue integrity      Long-Range Goal: The patiet will experience healing of her cellulits and decrease of ysmptoms   Start Date: 08/29/2021  Expected End Date: 10/15/2021  Priority: High  Note:   Current Barriers:  Impaired Tissue integrity r/t knowledge deficit about maintaining tissue integrity with the associated  condition of chronic venous stasis-   RNCM Clinical Goal(s):  Patient will verbalize understanding of plan for management by describing a measure to protect and heal the tissue and wound healing through collaboration with RN Care manager, provider, and care team.   Interventions: 1:1 collaboration with primary care provider regarding development and update of comprehensive plan of care as evidenced by provider attestation and co-signature Inter-disciplinary care team collaboration (see longitudinal plan of care) Evaluation of current treatment plan related to  self management and patient's adherence to plan as established by provider   08/29/21  (Status: New goal.) Evaluation of current treatment plan related to  Cellulitis of bilateral legs , self-management and patient's adherence to plan as established by provider. Discussed plans with patient for ongoing care management follow up and provided patient with direct contact information for care management team Provided education to patient re: Cellulitis, elevating her legs when not standing, cleaningher hands if she needs to tounch the area , making sure that she takes all druga that have been prescribed for her problem; Reviewed medications with patient and discussed  Collaborated with Kerry Hough EMT regarding medications Collaborated with staff [facility] to help with coordinating appointments and get ointment sent in I will send the patient a Calendar  and some  information about Cellulitis Reviewed scheduled/upcoming provider appointments including 08/31/21 to for follow up on her legs;  10/12/21:  I have spoken with  Misty Gray, who said that she has cellulitis in her legs and corns on the bottoms of her feet that hurt when she walks on the left more so than the right. We discussed the care for her legs and feet, keeping them clean, and putting antibiotic cream on them.   Patient Goals/Self-Care Activities: Patient will self administer  medications as prescribed Patient will attend all scheduled provider appointments Patient will call pharmacy for medication refills Patient will continue to perform ADL's independently Patient will continue to perform IADL's independently Patient will elevate her legs when sitting Patient/family will keep their hands clean to wash and medicate her legs      Problem: (Heart Failure)      Long-Range Goal: The patient  will Identified and Managed symptoms of congetesed heart failure   Start Date: 10/12/2021  Expected End Date: 11/14/2021  Priority: High  Note:   Current Barriers:  Chronic Disease Management support and education needs related to CHF  RNCM Clinical Goal(s):  Patient will verbalize understanding of plan for management of CHF  through collaboration with RN Care manager, provider, and care team.   Interventions: 1:1 collaboration with primary care provider regarding development and update of comprehensive plan of care as evidenced by provider attestation and co-signature Inter-disciplinary care team collaboration (see longitudinal plan of care) Evaluation of current treatment plan related to  self management and patient's adherence to plan as established by provider   Heart Failure Interventions:  (Status: New goal.) Basic overview and discussion of pathophysiology of Heart Failure reviewed; Provided education on low sodium diet; Reviewed Heart Failure Action Plan in depth  Reviewed role of diuretics in prevention of fluid overload and management of heart failure; Discussed the importance of keeping all appointments with provider; Provided patient with new contact information Advised patient about immunization Misty. Gray was in the hospital from 10/18-10/21/22 She has not experienced any problems since coming home, some shortness of breath that is normal for her. She had no questions regarding medication administration/dosing. She is taking meds as prescribed.  Home  Health has been to the home. They were there yesterday. She said they plan to come two times a week.  She knows she has an appointment with Dr. Pollie Meyer on 12/16/20 and plans to attend. Misty Gray was given my new contact information. I also mentioned receiving  her Flu shot, and she declined   Patient Goals/Self Care Activities: -Patient verbalizes understanding of plan -Self-administers medications as prescribed -Calls pharmacy for medication refills -Call's provider office for new concerns or questions -Attends all appointments -Follow CHF Action Plan -Adhere to low sodium diet      [name] RN, BSN, [facility] Care Management Coordinator [facility] Phone: [phone] I Fax: [phone]

## 2021-10-15 ENCOUNTER — Telehealth: Payer: Self-pay

## 2021-10-15 NOTE — Telephone Encounter (Signed)
[facility] calls nurse line reporting a roach infestation in patients home. Misty Gray reports they are actively working with a social work to have landlord resolve the issue. However, until the infestation is cleaned up [facility] will not be able to return back to the home.   Family has been made aware.

## 2021-10-16 ENCOUNTER — Other Ambulatory Visit: Payer: Self-pay

## 2021-10-16 ENCOUNTER — Telehealth: Payer: Self-pay

## 2021-10-16 ENCOUNTER — Ambulatory Visit (INDEPENDENT_AMBULATORY_CARE_PROVIDER_SITE_OTHER): Payer: Medicare Other | Admitting: Family Medicine

## 2021-10-16 ENCOUNTER — Encounter: Payer: Self-pay | Admitting: Family Medicine

## 2021-10-16 VITALS — BP 100/75 | HR 77 | Ht <= 58 in | Wt 155.0 lb

## 2021-10-16 DIAGNOSIS — I5020 Unspecified systolic (congestive) heart failure: Secondary | ICD-10-CM

## 2021-10-16 DIAGNOSIS — J309 Allergic rhinitis, unspecified: Secondary | ICD-10-CM | POA: Diagnosis not present

## 2021-10-16 DIAGNOSIS — G4733 Obstructive sleep apnea (adult) (pediatric): Secondary | ICD-10-CM | POA: Diagnosis not present

## 2021-10-16 DIAGNOSIS — I872 Venous insufficiency (chronic) (peripheral): Secondary | ICD-10-CM

## 2021-10-16 DIAGNOSIS — I519 Heart disease, unspecified: Secondary | ICD-10-CM

## 2021-10-16 DIAGNOSIS — I10 Essential (primary) hypertension: Secondary | ICD-10-CM

## 2021-10-16 DIAGNOSIS — Z1231 Encounter for screening mammogram for malignant neoplasm of breast: Secondary | ICD-10-CM

## 2021-10-16 DIAGNOSIS — D696 Thrombocytopenia, unspecified: Secondary | ICD-10-CM

## 2021-10-16 DIAGNOSIS — Z87310 Personal history of (healed) osteoporosis fracture: Secondary | ICD-10-CM | POA: Diagnosis not present

## 2021-10-16 LAB — COLOGUARD

## 2021-10-16 MED ORDER — TETANUS-DIPHTH-ACELL PERTUSSIS 5-2.5-18.5 LF-MCG/0.5 IM SUSY: 0.5000 mL | PREFILLED_SYRINGE | Freq: Once | INTRAMUSCULAR | 0 refills | Status: AC

## 2021-10-16 MED ORDER — FLUTICASONE PROPIONATE 50 MCG/ACT NA SUSP: 2.0000 | Freq: Every day | NASAL | 1 refills | Status: AC | PRN

## 2021-10-16 NOTE — Telephone Encounter (Signed)
Noted, spoke with patient about this during her visit today She is doing okay without HH services for now  Latrelle Dodrill, MD

## 2021-10-16 NOTE — Assessment & Plan Note (Signed)
Patient's platelet count at hospital discharge was 110,000. At that time, she was asymptomatic, as she is today. DDx include nutritional deficiency, infection, autoimmune etiology, and pseudothrombocytopenia. Will obtain CBC, vitamin B12 levels, folate levels, and blood smear to evaluate potential causes. Will follow up results.

## 2021-10-16 NOTE — Telephone Encounter (Signed)
Called and LVM for patient's son Tanairi Cypert per request of patient.  Informed him of US vascular arterial lower extremity and ABI.  10/22/2021 [place] 1300 and 1400 (procedure is preformed in two sessions consecutively. [phone]  .Glennie Hawk, CMA '

## 2021-10-16 NOTE — Assessment & Plan Note (Addendum)
Patient has been tolerating taking only spironolactone for BP control well. BP today is 100/75. Given good control, will continue current medication regimen. Blood pressure noted soft but patient denies lightheadedness or dizziness.

## 2021-10-16 NOTE — Progress Notes (Addendum)
error 

## 2021-10-16 NOTE — Assessment & Plan Note (Addendum)
Patient's exam findings consistent with chronic venous stasis. No warmth or fevers to suggest infection. Will obtain US ABI and lower extremity arterial duplex to assess perfusion to area given prior note of abnormal waveforms back in the summer. Patient has been counseled on raising her feet when not in use to reduce fluid collection. Will follow up results.

## 2021-10-16 NOTE — Assessment & Plan Note (Addendum)
Patient has been using O2 at home while sleeping which has helped her to sleep much better throughout the night. Continue current O2 therapy to control symptoms. Ambulated in clinic with sats >90% on room air - no need for daytime oxygen.

## 2021-10-16 NOTE — Assessment & Plan Note (Addendum)
Patient's recent wrist fracture raises suspicion for continued loss of bone density. Given last DEXA performed in 2015, will order DEXA for updated values. Will also obtain vitamin D levels as well given history of vitamin D deficiency. If DEXA scan is abnormal, consider starting bisphosphonate therapy.

## 2021-10-16 NOTE — Assessment & Plan Note (Signed)
Patient is out of Flonase. Prescription for refill sent.

## 2021-10-16 NOTE — Patient Instructions (Addendum)
Please schedule an appointment with Dr. Gala Romney  Refilled your flonase  Scheduling test of bloodflow to your legs  Checking labwork today  Take tetanus shot to your pharmacy  To schedule your mammogram, call the [facility] at [phone]. They are located at [address], [address].  Please schedule your bone density test at the same time.

## 2021-10-16 NOTE — Assessment & Plan Note (Signed)
Patient's symptoms are markedly improved with new medication regimen since discharge. Will obtain BMP to assess potassium levels and need for supplementation given low level in hospital. Follow up improvement.

## 2021-10-17 LAB — BASIC METABOLIC PANEL
BUN/Creatinine Ratio: 19 (ref 12–28)
BUN: 18 mg/dL (ref 8–27)
CO2: 27 mmol/L (ref 20–29)
Calcium: 10.3 mg/dL (ref 8.7–10.3)
Chloride: 97 mmol/L (ref 96–106)
Creatinine, Ser: 0.97 mg/dL (ref 0.57–1.00)
Glucose: 93 mg/dL (ref 70–99)
Potassium: 4.9 mmol/L (ref 3.5–5.2)
Sodium: 139 mmol/L (ref 134–144)
eGFR: 62 mL/min/{1.73_m2} (ref >59)

## 2021-10-17 LAB — CBC
Hematocrit: 42.9 % (ref 34.0–46.6)
Hemoglobin: 14.4 g/dL (ref 11.1–15.9)
MCH: 30.9 pg (ref 26.6–33.0)
MCHC: 33.6 g/dL (ref 31.5–35.7)
MCV: 92 fL (ref 79–97)
Platelets: 116 10*3/uL — ABNORMAL LOW (ref 150–450)
RBC: 4.66 x10E6/uL (ref 3.77–5.28)
RDW: 12.4 % (ref 11.7–15.4)
WBC: 5 10*3/uL (ref 3.4–10.8)

## 2021-10-17 LAB — VITAMIN D 25 HYDROXY (VIT D DEFICIENCY, FRACTURES): Vit D, 25-Hydroxy: 43.3 ng/mL (ref 30.0–100.0)

## 2021-10-17 LAB — FOLATE: Folate: 4 ng/mL (ref >3.0)

## 2021-10-17 LAB — VITAMIN B12: Vitamin B-12: 610 pg/mL (ref 232–1245)

## 2021-10-18 ENCOUNTER — Other Ambulatory Visit (HOSPITAL_COMMUNITY): Payer: Self-pay

## 2021-10-18 LAB — PATHOLOGIST SMEAR REVIEW
Basophils Absolute: 0 10*3/uL (ref 0.0–0.2)
Basos: 1 %
EOS (ABSOLUTE): 0.1 10*3/uL (ref 0.0–0.4)
Eos: 1 %
Hematocrit: 41.6 % (ref 34.0–46.6)
Hemoglobin: 14.1 g/dL (ref 11.1–15.9)
Immature Grans (Abs): 0 10*3/uL (ref 0.0–0.1)
Immature Granulocytes: 0 %
Lymphocytes Absolute: 1.4 10*3/uL (ref 0.7–3.1)
Lymphs: 28 %
MCH: 31.3 pg (ref 26.6–33.0)
MCHC: 33.9 g/dL (ref 31.5–35.7)
MCV: 92 fL (ref 79–97)
Monocytes Absolute: 0.5 10*3/uL (ref 0.1–0.9)
Monocytes: 10 %
Neutrophils Absolute: 3 10*3/uL (ref 1.4–7.0)
Neutrophils: 60 %
Platelets: 110 10*3/uL — ABNORMAL LOW (ref 150–450)
RBC: 4.51 x10E6/uL (ref 3.77–5.28)
RDW: 12.3 % (ref 11.7–15.4)
WBC: 5 10*3/uL (ref 3.4–10.8)

## 2021-10-21 NOTE — Progress Notes (Signed)
SUBJECTIVE:   CHIEF COMPLAINT / HPI:   Misty Gray (MRN: [id number]) is a 70 y.o. female with a history of CHF, HTN, atrial fibrillation, OSA, and GERD who presents for hospital follow up.  Follow up Patient was hospitalized with dyspnea 2/2 HFrEF exacerbation on 10/19. BNP was 990 and she appeared volume overloaded. She had diuresis with IV lasix > oral torsemide which improved her symptoms. She also had an irregular HR throughout admission with some runs of SVT, and she was assessed by cardiology and continued on her home amiodarone and edoxaban. Her home losartan was held and home spironolactone continued due to softer BP. She was also noted to have plts of 110 and ambulatory O2 sat of 84% for which she was given O2 at discharge.  Since being discharged, she has been taking all of her medications as prescribed. She has felt markedly better. She denies feeling any heart palpitations or SOB. She is using O2 at home while she sleeps, and she has been "sleeping like a baby."   Redness of lower legs Patient reports bilateral LE redness that is progressing. She contacted CCM about this issue on 10/12/21, and they recommended putting antibiotic cream on her legs. She had some mild redness of the areas over her last hospitalization on 10/05/21 that did not demonstrate overlying infection. She states that she has had cellulitis before for which she was treated.   OBJECTIVE:   BP 100/75   Pulse 77   Ht 4\' 9"  (1.448 m)   Wt 155 lb (70.3 kg)   SpO2 98%   BMI 33.54 kg/m    PHYSICAL EXAM  GEN: Well-developed, in NAD HEAD: NCAT CVS: Regular rate, irregular rhythm, normal S1/S2, no murmurs, rubs, gallops, no LE edema RESP: Breathing comfortably on RA, no retractions, wheezes, rhonchi, or crackles ABD: Soft, non-tender, large ventral hernia extending into lower abdomen SKIN: Erythematous, dry plaques on bilateral lower extremities without warm or pain EXT: Moves all extremities equally     ASSESSMENT/PLAN:   Chronic systolic dysfunction of left ventricle Patient's symptoms are markedly improved with new medication regimen since discharge. Will obtain BMP to assess potassium levels and need for supplementation given low level in hospital. Follow up improvement.  Osteopenia Patient's recent wrist fracture raises suspicion for continued loss of bone density. Given last DEXA performed in 2015, will order DEXA for updated values. Will also obtain vitamin D levels as well given history of vitamin D deficiency. If DEXA scan is abnormal, consider starting bisphosphonate therapy.  Hypertension Patient has been tolerating taking only spironolactone for BP control well. BP today is 100/75. Given good control, will continue current medication regimen.  Thrombocytopenia (HCC) Patient's platelet count at hospital discharge was 110,000. At that time, she was asymptomatic, as she is today. DDx include nutritional deficiency, infection, autoimmune etiology, and pseudothrombocytopenia. Will obtain CBC, vitamin B12 levels, folate levels, and blood smear to evaluate potential causes. Will follow up results.  OSA (obstructive sleep apnea) Patient has been using O2 at home while sleeping which has helped her to sleep much better throughout the night. Continue current O2 therapy to control symptoms.  Allergic rhinitis Patient is out of Flonase. Prescription for refill sent.  Chronic venous insufficiency Patient's exam findings consistent with chronic venous stasis. Will obtain 2016 ABI and lower extremity arterial duplex to assess perfusion to area. Patient has been counseled on raising her feet when not in use to reduce fluid collection. Will follow up results.  Health maintenance  Patient was given Tdap rx to take to her pharmacy   Referral for mammography sent. Declines COVID vaccine Working on completing cologuard  Janeal Holmes MS3 [facility]  Patient seen along  with MS3 student [facility]. I personally evaluated this patient along with the student, and verified all aspects of the history, physical exam, and medical decision making as documented by the student. I agree with the student's documentation and have made all necessary edits.  Levert Feinstein, MD  [facility]

## 2021-10-22 ENCOUNTER — Encounter (HOSPITAL_COMMUNITY): Payer: Self-pay

## 2021-10-22 ENCOUNTER — Other Ambulatory Visit: Payer: Self-pay

## 2021-10-22 ENCOUNTER — Ambulatory Visit (HOSPITAL_COMMUNITY)
Admission: RE | Admit: 2021-10-22 | Discharge: 2021-10-22 | Disposition: A | Discharge status: Home / Self Care | Payer: Medicare Other | Source: Ambulatory Visit | Attending: Family Medicine | Admitting: Family Medicine

## 2021-10-22 DIAGNOSIS — I872 Venous insufficiency (chronic) (peripheral): Secondary | ICD-10-CM | POA: Insufficient documentation

## 2021-10-22 NOTE — Progress Notes (Signed)
ABI has been completed.   Preliminary results in CV Proc.   Misty Gray Misty Gray 10/22/2021 1:57 PM

## 2021-10-22 NOTE — Progress Notes (Signed)
Pharmacy is not able to fill bubble packs yet-not quite due.  So I made her pill box with the other meds to make the med changes with increased potassium and removed the losartan.   Her bubble packs should be filled on Monday and sent out, she has enough meds in pill box through Tuesday.   Kerry Hough, EMT-Paramedic  10/22/2021

## 2021-11-05 DIAGNOSIS — G4733 Obstructive sleep apnea (adult) (pediatric): Secondary | ICD-10-CM | POA: Diagnosis not present

## 2021-11-05 DIAGNOSIS — R0902 Hypoxemia: Secondary | ICD-10-CM | POA: Diagnosis not present

## 2021-11-06 DIAGNOSIS — I509 Heart failure, unspecified: Secondary | ICD-10-CM | POA: Diagnosis not present

## 2021-11-07 ENCOUNTER — Encounter: Payer: Self-pay | Admitting: Family Medicine

## 2021-11-09 ENCOUNTER — Telehealth: Payer: Medicare Other

## 2021-11-19 ENCOUNTER — Other Ambulatory Visit (HOSPITAL_COMMUNITY): Payer: Self-pay | Admitting: Internal Medicine

## 2021-11-19 ENCOUNTER — Other Ambulatory Visit: Payer: Self-pay | Admitting: Family Medicine

## 2021-11-19 ENCOUNTER — Telehealth (HOSPITAL_COMMUNITY): Payer: Self-pay

## 2021-11-19 NOTE — Telephone Encounter (Signed)
[facility]ed [facility] to [facility] her bubble packs.   Not sure why this pharmacy was removed from her chart but I have added it back on.   Kerry Hough, EMT-paramedic  11/19/21

## 2021-11-21 ENCOUNTER — Telehealth: Payer: Self-pay | Admitting: Licensed Clinical Social Worker

## 2021-11-21 ENCOUNTER — Telehealth (HOSPITAL_COMMUNITY): Payer: Self-pay | Admitting: Licensed Clinical Social Worker

## 2021-11-21 NOTE — Telephone Encounter (Signed)
CSW informed by [employer] that pt having concerns with paying for her medications at this time- has balance of around $170 and has been unable to pay for several months of her medications which are around $30/month.   CSW called pt to discuss and assess for possible Extra Help program- unable to reach- left VM requesting return call.  Burna Sis, LCSW Clinical Social Worker [facility] Desk#: [phone] Cell#: [phone]

## 2021-11-21 NOTE — Telephone Encounter (Signed)
HF Paramedicine Team Based Care Meeting  HF MD- NA  HF NP - [name] NP-C   [facility] HF Paramedicine  Katie Vicente Males  [facility] admit within the last 30 days for heart failure? no  Medications concerns? Concerns with affording medication- CSW trying to contact to assess for LIS  Transportation issues ? Only one household car not always available but we will consider for [facility] transport if that becomes an issue  Education needs? no  SDOH -no  Eligible for discharge? Likely ready for DC- on bubble packs and no active needs currently.  Burna Sis, LCSW Clinical Social Worker [facility] Desk#: [phone] Cell#: [phone]

## 2021-11-28 ENCOUNTER — Ambulatory Visit: Payer: Medicare Other

## 2021-12-05 DIAGNOSIS — G4733 Obstructive sleep apnea (adult) (pediatric): Secondary | ICD-10-CM | POA: Diagnosis not present

## 2021-12-05 DIAGNOSIS — R0902 Hypoxemia: Secondary | ICD-10-CM | POA: Diagnosis not present

## 2021-12-06 DIAGNOSIS — I509 Heart failure, unspecified: Secondary | ICD-10-CM | POA: Diagnosis not present

## 2021-12-18 ENCOUNTER — Other Ambulatory Visit (HOSPITAL_COMMUNITY): Payer: Self-pay

## 2021-12-18 NOTE — Progress Notes (Addendum)
Paramedicine Encounter    Patient ID: Misty Gray, female    DOB: Apr 21, 1951, 71 y.o.   MRN: [id number]  Pt reports she is doing ok. She denies increased sob. No dizziness, no c/p. No edema noted. Legs look ok, no redness or hotness.    Contacted extra help for her to see if she is eligible for it. Income wise she is eligible for the medicare savings plan. They are going to send application for her to fill out and send back in.  Son was there and advised that the doctor ordered [name] for pt and the pts husband, he is very sick with liver and pancreatic CA for a joint visit for both of them, the nurse came out there and then refused to go back out due to the roaches. The [name] agency was [facility].  Pt is due for bubble packs however due to her outstanding balance, I am not sure he is willing to continue. Will ask jenna for help on this matter.  Her meds will hopefully be cheaper and more affordable with the extra help assistance.   BP 102/78    Pulse 64    SpO2 92%  Weight yesterday-?? Last visit [name]  Patient Care Team: Misty Rio, MD as PCP - General (Family Medicine) Misty Grayer, MD (Cardiology) Misty Ny, LCSW as Social Worker (Licensed Clinical Social Worker) Misty Arms, RN as Case Manager  Patient Active Problem List   Diagnosis Date Noted   Thrombocytopenia ([place]) 10/16/2021   Heart failure ([place]) 10/03/2021   Stasis dermatitis of both legs 08/05/2021   Hand injury, left, initial encounter 09/22/2020   CHF exacerbation ([place]) 07/12/2020   Hospital discharge follow-up 11/17/2019   Onychomycosis 11/17/2019   Abdominal wall hernia 11/05/2017   Anticoagulated 11/05/2017   Rash and nonspecific skin eruption 09/15/2017   Ventral hernia without obstruction or gangrene 08/15/2017   Chronic venous stasis dermatitis 12/26/2016   Urinary tract infection without hematuria    Frequent PVCs    Hypokalemia    Housing problems 11/23/2016   Chronic atrial  fibrillation ([place]) 11/01/2016   Hypotension 10/31/2016   Healthcare maintenance 08/12/2016   Heme positive stool 08/09/2016   Intertrigo 08/09/2016   Chronic anticoagulation 04/26/2016   Chronic venous insufficiency    Cellulitis 09/07/2015   Breast mass, left 10/05/2014   Osteopenia 09/15/2014   Hypertension 99991111   Chronic systolic dysfunction of left ventricle 05/05/2013   At high risk for falls 02/11/2013   Vitamin D deficiency 07/15/2012   Sinoatrial node dysfunction ([place]) 05/08/2011   Pacemaker 08/10/2009   Allergic rhinitis 05/31/2008   Morbid obesity ([place]) 02/12/2007   GASTROESOPHAGEAL REFLUX, NO ESOPHAGITIS 02/12/2007   Female stress incontinence 02/12/2007   Seizure disorder ([place]) 02/12/2007   OSA (obstructive sleep apnea) 02/12/2007    Current Outpatient Medications:    amiodarone (PACERONE) 200 MG tablet, Take 0.5 tablets (100 mg total) by mouth daily., Disp: 45 tablet, Rfl: 3   D3-1000 25 MCG (1000 UT) tablet, TAKE 1 TABLET BY MOUTH DAILY (AM), Disp: 28 tablet, Rfl: 0   edoxaban (SAVAYSA) 60 MG TABS tablet, Take 60 mg by mouth daily., Disp: 30 tablet, Rfl: 11   fluticasone (FLONASE) 50 MCG/ACT nasal spray, Place 2 sprays into both nostrils daily as needed. For congestion., Disp: 16 g, Rfl: 1   omeprazole (PRILOSEC) 20 MG capsule, TAKE ONE CAPSULE BY MOUTH EVERY MORNING, Disp: 30 capsule, Rfl: 0   PHENobarbital (LUMINAL) 64.8 MG tablet, TAKE ONE TABLET  BY MOUTH TWICE DAILY (AM+BEDTIME), Disp: 56 tablet, Rfl: 0   potassium chloride (KLOR-CON) 10 MEQ tablet, TAKE 4 TABLETS BY MOUTH THREE TIMES A DAY WITH MEALS AND 2 TABLETS AT BEDTIME, Disp: 420 tablet, Rfl: 6   spironolactone (ALDACTONE) 25 MG tablet, TAKE 1 TABLET (25 MG TOTAL) BY MOUTH DAILY (AM), Disp: 30 tablet, Rfl: 4   torsemide (DEMADEX) 20 MG tablet, TAKE 4 TABLETS BY MOUTH EVERY MORNING AND 3 TABLETS EVERY EVENING (Patient taking differently: Take 60-80 mg by mouth See admin instructions. 80 mg in the morning  60 mg in the evenin), Disp: 210 tablet, Rfl: 5   triamcinolone cream (KENALOG) 0.1 %, Apply 1 application topically 2 (two) times daily. (Patient taking differently: Apply 1 application topically 2 (two) times daily as needed (rash).), Disp: 30 g, Rfl: 1 Allergies  Allergen Reactions   Aspirin Hives and Rash   Penicillins Rash and Other (See Comments)    Has patient had a PCN reaction causing immediate rash, facial/tongue/throat swelling, SOB or lightheadedness with hypotension: Yes Has patient had a PCN reaction causing severe rash involving mucus membranes or skin necrosis: No Has patient had a PCN reaction that required hospitalization No Has patient had a PCN reaction occurring within the last 10 years: Yes If all of the above answers are "NO", then may proceed with Cephalosporin use.  Patient has tolerated cephalosporins in 2017.   Tape Other (See Comments)    NO PAPER TAPE-MUST BE MEDICAL TAPE - unknown reaction NO PAPER TAPE-MUST BE MEDICAL TAPE - unknown reaction   Wool Alcohol [Lanolin] Hives   Amoxicillin Rash    Patient has tolerated cephalosporins   Ampicillin Itching and Rash    Patient has tolerated cephalosporins      Social History   Socioeconomic History   Marital status: Married    Spouse name: Misty Gray   Number of children: 4   Years of education: 12   Highest education level: Not on file  Occupational History   Occupation: disabled  Tobacco Use   Smoking status: Former    Packs/day: 1.00    Years: 7.00    Pack years: 7.00    Types: Cigarettes    Quit date: 03/16/1969    Years since quitting: 52.7   Smokeless tobacco: Never  Vaping Use   Vaping Use: Never used  Substance and Sexual Activity   Alcohol use: No   Drug use: No   Sexual activity: Not Currently    Birth control/protection: Post-menopausal  Other Topics Concern   Not on file  Social History Narrative   Lives with husband Misty Gray - Misty Gray lives with them as do his 2 children    Daughter, Misty Gray, and her 3 children live in [place]   Son -  Misty Gray is incarcerated   Nephew, Misty Gray lives with them   Daughter, Vickii Chafe, in [place] June 2013 to a better home on [address].      Lives with husband, son, grandson, granddaughter, nephew.   Social Determinants of Health   Financial Resource Strain: Not on file  Food Insecurity: Not on file  Transportation Needs: Not on file  Physical Activity: Not on file  Stress: Not on file  Social Connections: Not on file  Intimate Partner Violence: Not on file    Physical Exam      Future Appointments  Date Time Provider [place]  12/20/2021  7:00 AM  [facility] DEVICE REMOTES [name]  03/21/2022  7:00 AM [facility] DEVICE REMOTES [name]  06/20/2022  7:00 AM [facility] DEVICE REMOTES [name]  09/19/2022  7:00 AM [facility] DEVICE REMOTES [name]  12/19/2022  7:00 AM [facility] DEVICE REMOTES [name]  03/20/2023  7:00 AM [facility] DEVICE REMOTES [name]  06/20/2023  7:05 AM [facility] DEVICE REMOTES [name]       [name], EMT-Paramedic [place] Par[facility]  12/18/21

## 2021-12-24 ENCOUNTER — Other Ambulatory Visit: Payer: Self-pay

## 2021-12-24 NOTE — Telephone Encounter (Signed)
-----   Message from [name], EMT sent at 12/24/2021  2:18 PM EST ----- Regarding: refill Hey,   She needs new rx of her phenobarbital sent to [place] for filling please.   This is now a week since she has had her medicine, pharmacy sent over refill request to wrong doctor-I believe it was a doc she seen when she was admitted in hosptial last time.   Thank you,  [name], EMT-Paramedic  12/24/21

## 2021-12-25 ENCOUNTER — Telehealth (HOSPITAL_COMMUNITY): Payer: Self-pay

## 2021-12-25 MED ORDER — PHENOBARBITAL 64.8 MG PO TABS: ORAL_TABLET | ORAL | 0 refills | Status: DC

## 2021-12-25 NOTE — Telephone Encounter (Signed)
Pt called me today reporting she just got her meds delivered, she has been without her meds for a week, I have been in contact with the pharmacy about the delay and they have been waiting on refills so he can fill her bubble pack.  She reports that she is very sob, her legs are swelling. Pt did not want to go to hosp, did not want EMS.  I contacted clinic and since she has been without her meds for the past week, per clinic she is to resume her meds, I will f/u tomor to see how she is doing.  I called pt to tell her that, also I advised her should she feel worse then to go to hosp for treatment.     Will f/u tomor.   Kerry Hough, EMT-Paramedic  12/25/21

## 2022-01-01 ENCOUNTER — Other Ambulatory Visit (HOSPITAL_COMMUNITY): Payer: Self-pay

## 2022-01-01 NOTE — Progress Notes (Signed)
Paramedicine Encounter    Patient ID: Misty Gray, female    DOB: June 20, 1951, 71 y.o.   MRN: [id number]  Came by today for f/u from last week from being w/o meds and gaining weight and sob. Since she has restarted her meds she has felt much better, her sob has gotten better, she does get sob at times but nothing abnormal from before.  Her legs look ok today.  Lungs clear.  At our last visit I had called the extra help program and they were suppose to send out packet of papers that needed to be signed and sent back, she denies receiving anything in the mail yet from them.  She does her SSI paper and she will be getting right under $500 after her premium is taken out.  Unable to weigh-her batteries are dead.  She is not sure if she is eligible for SSDI-not sure if she would be able to get more money. I went with her in the bedroom, her house is very cluttered, she is using space heaters for the rooms they stay in, there is pretty much a path through out the house to walk thru the living room to kitchen and in her bedroom.  They rent the house, the house is infested with roaches, so very bad and it smells awful in there.  Her son had reported in the past that they are looking at  a house in [place] but he said that months ago and still nothing.  I think her husband has medicare as well, I think they would thrive much better in an assisted living together, her husband is quite sick with liver CA and neither of them can get nursing agencies to come do any type of care due to the roaches. But not sure if they are open to that idea.  Will discuss with Education officer, museum as well.    BP 118/78    Pulse 76    Resp 18    SpO2 97%  Weight yesterday-? Last visit weight-155 @ clinic   Patient Care Team: [name], MD as PCP - General (Family Medicine) [name], MD (Cardiology) [name], LCSW as Social Worker (Licensed Clinical Social Worker) [name], RN as Case Manager  Patient  Active Problem List   Diagnosis Date Noted   Thrombocytopenia ([place]) 10/16/2021   Heart failure ([place]) 10/03/2021   Stasis dermatitis of both legs 08/05/2021   Hand injury, left, initial encounter 09/22/2020   CHF exacerbation ([place]) 07/12/2020   Hospital discharge follow-up 11/17/2019   Onychomycosis 11/17/2019   Abdominal wall hernia 11/05/2017   Anticoagulated 11/05/2017   Rash and nonspecific skin eruption 09/15/2017   Ventral hernia without obstruction or gangrene 08/15/2017   Chronic venous stasis dermatitis 12/26/2016   Urinary tract infection without hematuria    Frequent PVCs    Hypokalemia    Housing problems 11/23/2016   Chronic atrial fibrillation ([place]) 11/01/2016   Hypotension 10/31/2016   Healthcare maintenance 08/12/2016   Heme positive stool 08/09/2016   Intertrigo 08/09/2016   Chronic anticoagulation 04/26/2016   Chronic venous insufficiency    Cellulitis 09/07/2015   Breast mass, left 10/05/2014   Osteopenia 09/15/2014   Hypertension 99991111   Chronic systolic dysfunction of left ventricle 05/05/2013   At high risk for falls 02/11/2013   Vitamin D deficiency 07/15/2012   Sinoatrial node dysfunction ([place]) 05/08/2011   Pacemaker 08/10/2009   Allergic rhinitis 05/31/2008   Morbid obesity ([place]) 02/12/2007   GASTROESOPHAGEAL  REFLUX, NO ESOPHAGITIS 02/12/2007   Female stress incontinence 02/12/2007   Seizure disorder (HCC) 02/12/2007   OSA (obstructive sleep apnea) 02/12/2007    Current Outpatient Medications:    amiodarone (PACERONE) 200 MG tablet, Take 0.5 tablets (100 mg total) by mouth daily., Disp: 45 tablet, Rfl: 3   D3-1000 25 MCG (1000 UT) tablet, TAKE 1 TABLET BY MOUTH DAILY (AM), Disp: 28 tablet, Rfl: 0   edoxaban (SAVAYSA) 60 MG TABS tablet, Take 60 mg by mouth daily., Disp: 30 tablet, Rfl: 11   fluticasone (FLONASE) 50 MCG/ACT nasal spray, Place 2 sprays into both nostrils daily as needed. For congestion., Disp: 16 g, Rfl: 1   omeprazole  (PRILOSEC) 20 MG capsule, TAKE ONE CAPSULE BY MOUTH EVERY MORNING, Disp: 30 capsule, Rfl: 0   PHENobarbital (LUMINAL) 64.8 MG tablet, TAKE ONE TABLET BY MOUTH TWICE DAILY (AM+BEDTIME), Disp: 56 tablet, Rfl: 0   potassium chloride (KLOR-CON) 10 MEQ tablet, TAKE 4 TABLETS BY MOUTH THREE TIMES A DAY WITH MEALS AND 2 TABLETS AT BEDTIME, Disp: 420 tablet, Rfl: 6   spironolactone (ALDACTONE) 25 MG tablet, TAKE 1 TABLET (25 MG TOTAL) BY MOUTH DAILY (AM), Disp: 30 tablet, Rfl: 4   torsemide (DEMADEX) 20 MG tablet, TAKE 4 TABLETS BY MOUTH EVERY MORNING AND 3 TABLETS EVERY EVENING (Patient taking differently: Take 60-80 mg by mouth See admin instructions. 80 mg in the morning 60 mg in the evenin), Disp: 210 tablet, Rfl: 5   triamcinolone cream (KENALOG) 0.1 %, Apply 1 application topically 2 (two) times daily. (Patient taking differently: Apply 1 application topically 2 (two) times daily as needed (rash).), Disp: 30 g, Rfl: 1 Allergies  Allergen Reactions   Aspirin Hives and Rash   Penicillins Rash and Other (See Comments)    Has patient had a PCN reaction causing immediate rash, facial/tongue/throat swelling, SOB or lightheadedness with hypotension: Yes Has patient had a PCN reaction causing severe rash involving mucus membranes or skin necrosis: No Has patient had a PCN reaction that required hospitalization No Has patient had a PCN reaction occurring within the last 10 years: Yes If all of the above answers are "NO", then may proceed with Cephalosporin use.  Patient has tolerated cephalosporins in 2017.   Tape Other (See Comments)    NO PAPER TAPE-MUST BE MEDICAL TAPE - unknown reaction NO PAPER TAPE-MUST BE MEDICAL TAPE - unknown reaction   Wool Alcohol [Lanolin] Hives   Amoxicillin Rash    Patient has tolerated cephalosporins   Ampicillin Itching and Rash    Patient has tolerated cephalosporins      Social History   Socioeconomic History   Marital status: Married    Spouse name: [name]    Number of children: 4   Years of education: 12   Highest education level: Not on file  Occupational History   Occupation: disabled  Tobacco Use   Smoking status: Former    Packs/day: 1.00    Years: 7.00    Pack years: 7.00    Types: Cigarettes    Quit date: 03/16/1969    Years since quitting: 52.8   Smokeless tobacco: Never  Vaping Use   Vaping Use: Never used  Substance and Sexual Activity   Alcohol use: No   Drug use: No   Sexual activity: Not Currently    Birth control/protection: Post-menopausal  Other Topics Concern   Not on file  Social History Narrative   Lives with husband [name] lives with them  as do his 2 children   Daughter, [name], and her 3 children live in [place]   Son -  [name] is incarcerated   Nephew, Maebree Martina lives with them   Daughter, Vickii Chafe, in [place] June 2013 to a better home on [facility].      Lives with husband, son, grandson, granddaughter, nephew.   Social Determinants of Health   Financial Resource Strain: Not on file  Food Insecurity: Not on file  Transportation Needs: Not on file  Physical Activity: Not on file  Stress: Not on file  Social Connections: Not on file  Intimate Partner Violence: Not on file    Physical Exam      Future Appointments  Date Time Provider [place]  03/21/2022  7:00 AM [facility] DEVICE REMOTES [name]  06/20/2022  7:00 AM [facility] DEVICE REMOTES [name]  09/19/2022  7:00 AM [facility] DEVICE REMOTES [name]  12/19/2022  7:00 AM [facility] DEVICE [facility]OTES [name]  03/20/2023  7:00 AM [facility] DEVICE REMOTES [name]  06/20/2023  7:05 AM [facility] DEVICE REMOTES [name]       [name], EMT-Paramedic [place] Paramedic  01/01/22

## 2022-01-02 ENCOUNTER — Telehealth (HOSPITAL_COMMUNITY): Payer: Self-pay | Admitting: Licensed Clinical Social Worker

## 2022-01-02 NOTE — Telephone Encounter (Signed)
HF Paramedicine Team Based Care Meeting  HF MD- NA  HF NP - [name] NP-C   [facility] HF Paramedicine  Katie Vicente Males  [facility] admit within the last 30 days for heart failure? no  Medications concerns? On bubble packs  SDOH - working on extra help  Eligible for discharge? Still has social concerns  Burna Sis, LCSW Clinical Social Worker [facility] Desk#: [phone] Cell#: [phone]

## 2022-01-08 ENCOUNTER — Other Ambulatory Visit: Payer: Self-pay

## 2022-01-08 MED ORDER — OMEPRAZOLE 20 MG PO CPDR: 20.0000 mg | DELAYED_RELEASE_CAPSULE | Freq: Every morning | ORAL | 1 refills | Status: DC

## 2022-01-14 ENCOUNTER — Other Ambulatory Visit (HOSPITAL_COMMUNITY): Payer: Self-pay | Admitting: *Deleted

## 2022-01-14 MED ORDER — AMIODARONE HCL 200 MG PO TABS: 100.0000 mg | ORAL_TABLET | Freq: Every day | ORAL | 3 refills | Status: DC

## 2022-01-14 MED ORDER — POTASSIUM CHLORIDE CRYS ER 10 MEQ PO TBCR: EXTENDED_RELEASE_TABLET | ORAL | 6 refills | Status: DC

## 2022-01-14 MED ORDER — SPIRONOLACTONE 25 MG PO TABS: ORAL_TABLET | ORAL | 3 refills | Status: DC

## 2022-01-17 ENCOUNTER — Other Ambulatory Visit: Payer: Self-pay | Admitting: Family Medicine

## 2022-01-17 ENCOUNTER — Telehealth (HOSPITAL_COMMUNITY): Payer: Self-pay | Admitting: Pharmacist

## 2022-01-17 ENCOUNTER — Other Ambulatory Visit (HOSPITAL_COMMUNITY): Payer: Self-pay

## 2022-01-17 ENCOUNTER — Other Ambulatory Visit (HOSPITAL_COMMUNITY): Payer: Self-pay | Admitting: Family Medicine

## 2022-01-17 ENCOUNTER — Telehealth (HOSPITAL_COMMUNITY): Payer: Self-pay

## 2022-01-17 NOTE — Telephone Encounter (Signed)
[facility] advised her savaysa needs a PA to get refilled please  Kerry Hough, EMT-Paramedic  01/17/22

## 2022-01-17 NOTE — Telephone Encounter (Signed)
Advanced Heart Failure Patient Advocate Encounter  Prior Authorization for Misty Gray has been approved.    PA# [id number] Effective dates: 12/16/21 through 12/15/22  Karle Plumber, PharmD, BCPS, BCCP, CPP Heart Failure Clinic Pharmacist [phone]

## 2022-01-17 NOTE — Telephone Encounter (Signed)
Prior authorization for Misty Gray has been approved.

## 2022-01-18 ENCOUNTER — Other Ambulatory Visit (HOSPITAL_COMMUNITY): Payer: Self-pay

## 2022-01-18 MED ORDER — POTASSIUM CHLORIDE CRYS ER 10 MEQ PO TBCR: EXTENDED_RELEASE_TABLET | ORAL | 3 refills | Status: DC

## 2022-01-20 NOTE — Progress Notes (Signed)
[facility] Note  Heart Failure Date:  01/20/2022   ID:  [name], DOB 09-21-1951, MRN [id number]  Location: Home  Provider location: [facility] Type of Visit: Established patient  PCP:  Latrelle Dodrill, MD- Internal Medicine   Cardiologist:  None Primary HF: Dr. Gala Romney EP: Dr Johney Frame   Chief Complaint: F/u for Chronic Systolic Heart Failure    History of Present Illness:  Ms. Dubach is a 71 y/o woman with morbid obesity, OSA, chronic atrial fibrillation with tachy-brady syndrome s/p PPM, frequent PVCs, systolic HF due to NICM.   Admitted for ADHF in 10/17. Cath with normal coronary arteries and elevated filling pressures. Echo  EF 30-35% (previously 35-40%). Felt to have possible PVC or RV pacing cardiomyopathy. (She had 81% RV pacing and 15-20 PVCs per minute). Started on po amio with suppression of PVCs. Diuresed 41 pounds. Discharge weight 197.   Echo 8/18 LVEF 25-30%, Trivial AI, Mod MR, Severe LAE, Mild RV dilation, Mild RAE, PA peak pressure 31 mm Hg.  Echo 1/20 EF 25-30% (no improvement with PVC suppression)  Admitted 07/2020 with A/C systolic heart failure. Diuresed 21 pounds. RV [pacing 28% Decreased backup pacing rate to 50 bpm from 70 bpm.   Has been followed by Dr. Johney Frame. At time of PM gen change < 20% RV bacing and QRS < 130 so felt not to be candidate for CRT upgrade. Patient did not want ICD.   Admitted 10/22 with ADHF. Echo EF 20-25%. RV moderately HK  Here for follow-up. Feeling good. Gets around with cane. Can do ADLs without problems. Does dishes, laundry and takes dogs out. Gets SOB if she does too much. No CP, orthopnea or PND. + LE edema  Compliant with meds.   Pacer interrogated. Chronic AF 2.8% VP  Studies: ECHO 06/2020  EF 25-30% RV moderately reduced and enlarged.   R/LHC 10/03/16 Ao = 94/58 (72) LV = 104/20 RA = 21 RV = 65/4/19 PA = 65/21 (38) PCW = 23 Fick cardiac output/index =  6.1/2.9 PVR = 2.5 WU FA sat = 97%  PA sat = 69%, 70%  Assessment: 1. Normal coronary arteries 2. NICM with EF 30-35% by echo 3. Persistently elevated biventricular pressures   Past Medical History:  Diagnosis Date   Abnormal CT of the head 12/16/1984   R parietal atrophy   Allergic rhinitis, cause unspecified    Altered mental status 11/28/2016   Anemia    Arthritis    "hands and knees" (09/06/2015)   Atrial fibrillation (HCC)    Cellulitis 09/07/2015   Cellulitis and abscess of leg 09/2016   bilateral   CHF (congestive heart failure) (HCC)    Diaphragmatic hernia without mention of obstruction or gangrene    Dyspnea    Exertional dyspnea 06/22/12   Family history of adverse reaction to anesthesia    "daughter fights w/them; just can't relax during OR; stay awake during the OR"   Fatigue 06/22/2012   Female stress incontinence    GERD (gastroesophageal reflux disease)    Grand mal seizure (HCC)    H/O hiatal hernia    two removed   Heart murmur    High cholesterol    History of blood transfusion 1956   "related to nose bleed"   Hypertension    Influenza A 01/11/2014   Lichenification and lichen simplex chronicus    Migraine    "when I was a teenager"   Morbid  obesity (HCC)    Nonischemic cardiomyopathy (HCC)    EF has normalized-repeat Pending    On home oxygen therapy    "2L when I'm asleep in bed" (09/06/2015)   OSA on CPAP    Pacemaker  st judes    Patient states "this is my third pacemaker"   Pneumonia 2004; 2011; 11/2011   Seizures (HCC) since 1981   "can hear you talking but sounds like you are in big tunnel; have them often if not taking RX; last one was 06/17/12" (09/06/2015)   Shortness of breath 10/24/2010   Qualifier: Diagnosis of  By: [place], Bonnie     Sick sinus syndrome (HCC)    WITH PRIOR DDD PACEMAKER IMPLANTATION   Skin irritation 07/23/2017   Somnolence    Stroke (HCC) 1988   "mouth drawed real bad on my left side; found out it was a seizure"    Syncope and collapse 06/22/12   "hit forehead and left knee"; denies loss of consciousness   Unspecified venous (peripheral) insufficiency    Past Surgical History:  Procedure Laterality Date   APPENDECTOMY     CARDIAC CATHETERIZATION N/A 10/03/2016   Procedure: Right/Left Heart Cath and Coronary Angiography;  Surgeon: Dolores Patty, MD;  Location: [facility] INVASIVE CV LAB;  Service: Cardiovascular;  Laterality: N/A;   HERNIA REPAIR  ? 1988; 04/15/1998   INSERT / REPLACE / REMOVE PACEMAKER  12/16/1990   DDD EF 30%;   INSERT / REPLACE / REMOVE PACEMAKER  07/25/2003   replaced by BB, leads are both IS1 and from 1992   INSERT / REPLACE / REMOVE PACEMAKER  05/21/2011   JA; generator change   LEG SKIN LESION  BIOPSY / EXCISION  05/16/2001   Lichen Planus   ORIF RADIAL FRACTURE Left 06/01/2021   Procedure: OPEN REDUCTION INTERNAL FIXATION (ORIF) RADIAL FRACTURE;  Surgeon: Dairl Ponder, MD;  Location: [facility] OR;  Service: Orthopedics;  Laterality: Left;  AXILLIARY BLOCK   PPM GENERATOR CHANGEOUT N/A 06/21/2021   Procedure: PPM GENERATOR CHANGEOUT;  Surgeon: [name], MD;  Location: [facility] INVASIVE CV LAB;  Service: Cardiovascular;  Laterality: N/A;   VENTRAL HERNIA REPAIR  1989; 04/15/1998    Current Outpatient Medications  Medication Sig Dispense Refill   amiodarone (PACERONE) 200 MG tablet Take 0.5 tablets (100 mg total) by mouth daily. 45 tablet 3   D3-1000 25 MCG (1000 UT) tablet TAKE 1 TABLET BY MOUTH DAILY (AM) 28 tablet 0   fluticasone (FLONASE) 50 MCG/ACT nasal spray Place 2 sprays into both nostrils daily as needed. For congestion. 16 g 1   omeprazole (PRILOSEC) 20 MG capsule TAKE ONE CAPSULE BY MOUTH EVERY MORNING 30 capsule 3   PHENobarbital (LUMINAL) 64.8 MG tablet TAKE ONE TABLET BY MOUTH TWICE DAILY (AM+BEDTIME) 60 tablet 3   potassium chloride (KLOR-CON M) 10 MEQ tablet TAKE 4 TABLETS BY MOUTH THREE TIMES A DAY WITH MEALS AND 2 TABLETS AT BEDTIME 540 tablet 3   SAVAYSA 60 MG TABS tablet TAKE  ONE TABLET BY MOUTH ONCE DAILY (AM) 30 tablet 11   spironolactone (ALDACTONE) 25 MG tablet TAKE 1 TABLET (25 MG TOTAL) BY MOUTH DAILY (AM) 90 tablet 3   torsemide (DEMADEX) 20 MG tablet TAKE 4 TABLETS BY MOUTH EVERY MORNING AND 3 TABLETS EVERY EVENING (Patient taking differently: Take 60-80 mg by mouth See admin instructions. 80 mg in the morning 60 mg in the evenin) 210 tablet 5   triamcinolone cream (KENALOG) 0.1 % Apply 1 application topically 2 (  two) times daily. (Patient taking differently: Apply 1 application topically 2 (two) times daily as needed (rash).) 30 g 1   No current facility-administered medications for this encounter.    Allergies:   Aspirin, Penicillins, Tape, Wool alcohol [lanolin], Amoxicillin, and Ampicillin   Social History:  The patient  reports that she quit smoking about 52 years ago. Her smoking use included cigarettes. She has a 7.00 pack-year smoking history. She has never used smokeless tobacco. She reports that she does not drink alcohol and does not use drugs.   Family History:  The patient's family history includes Cancer in her mother and sister; Diabetes in her son; Heart disease in her father; Hypertension in her brother and sister; Rheum arthritis in her daughter; Sleep apnea in her son; Stroke in her father.   ROS:  Please see the history of present illness.   All other systems are personally reviewed and negative.  There were no vitals filed for this visit.   Wt Readings from Last 3 Encounters:  10/16/21 70.3 kg (155 lb)  10/08/21 73.5 kg (162 lb)  10/05/21 68.9 kg (152 lb)   ECG: afib, 71 Frequent PVCs. Personally reviewed    PHYSICAL EXAM: General:  Elderly woman walks with cane. No resp difficulty HEENT: normal Neck: supple. no JVD. Carotids 2+ bilat; no bruits. No lymphadenopathy or thryomegaly appreciated. Cor: PMI nondisplaced. irregular rate & rhythm. No rubs, gallops or murmurs. Lungs: clear Abdomen: soft, nontender, nondistended. No  hepatosplenomegaly. No bruits or masses. Good bowel sounds. Extremities: no cyanosis, clubbing, rash, 1+ chronic venous stasis  Neuro: alert & orientedx3, cranial nerves grossly intact. moves all 4 extremities w/o difficulty. Affect pleasant    Wt Readings from Last 3 Encounters:  10/16/21 70.3 kg (155 lb)  10/08/21 73.5 kg (162 lb)  10/05/21 68.9 kg (152 lb)    ASSESSMENT AND PLAN:  1. Chronic systolic HF due to NICM:  - ? PVC induced vs RV pacing.  - Echo 12/06/2016 EF 10-15%.  - Cath 10/17 normal coronary arteries.   - Echo 06/2020 EF 25-30%, RV moderately reduced.  - Echo 10/22 EF 20-25% RV moderately reduced - Stable NYHA II-III.  - On torsemide 80/60. Volume status looks ok.  - Continue spiro 25 mg daily   - Continue losartan 12.5 mg daily (did not tolerate higher dose previously due to orthostatic hypotension)  - Digoxin was stopped due to elevated level.    - Would not use SGLT2i given high risk of genital yeast infection - Intolerant to ? blockers. - Labs today - Refused ICD upgrade  2. Chronic AF with tachy-brady s/p pacemaker - Intolerant to ? blockers. Rate controlled.  - Continue amio 100 mg daily. - Continue endoxaban. No bleeding - Followed by Dr. Johney Frame  - At time of gen change 7/22. < 20% RV bacing and QRS < 130 so felt not to be candidate for CRT upgrade. Patient did not want ICD.  - Check amio labs and CBC  3. Frequent PVCs - Previously suppressed on amio 100 mg daily.   - EF did not improve with PVC suppression  - Has frequent PVCs on ECG today. Unable to get PVC burden off PPM given VVI settings. - Can consider zio at next visit.   4. RLE cellulitis/wound - improved   Signed, Arvilla Meres, MD  01/20/2022 6:58 PM  [facility] [address] [facility] [place] [address] [phone] (office) [phone] (fax)

## 2022-01-21 ENCOUNTER — Encounter (HOSPITAL_COMMUNITY): Payer: Self-pay | Admitting: Internal Medicine

## 2022-01-21 ENCOUNTER — Other Ambulatory Visit: Payer: Self-pay

## 2022-01-21 ENCOUNTER — Ambulatory Visit (HOSPITAL_COMMUNITY)
Admission: RE | Admit: 2022-01-21 | Discharge: 2022-01-21 | Disposition: A | Discharge status: Home / Self Care | Payer: Medicare HMO | Source: Ambulatory Visit | Attending: Internal Medicine | Admitting: Internal Medicine

## 2022-01-21 ENCOUNTER — Ambulatory Visit (INDEPENDENT_AMBULATORY_CARE_PROVIDER_SITE_OTHER): Payer: Medicare HMO

## 2022-01-21 VITALS — BP 110/78 | HR 78 | Wt 147.2 lb

## 2022-01-21 DIAGNOSIS — I482 Chronic atrial fibrillation, unspecified: Secondary | ICD-10-CM | POA: Diagnosis not present

## 2022-01-21 DIAGNOSIS — L03115 Cellulitis of right lower limb: Secondary | ICD-10-CM

## 2022-01-21 DIAGNOSIS — Z79899 Other long term (current) drug therapy: Secondary | ICD-10-CM | POA: Insufficient documentation

## 2022-01-21 DIAGNOSIS — I493 Ventricular premature depolarization: Secondary | ICD-10-CM | POA: Diagnosis not present

## 2022-01-21 DIAGNOSIS — I495 Sick sinus syndrome: Secondary | ICD-10-CM

## 2022-01-21 DIAGNOSIS — Z7901 Long term (current) use of anticoagulants: Secondary | ICD-10-CM | POA: Insufficient documentation

## 2022-01-21 DIAGNOSIS — I519 Heart disease, unspecified: Secondary | ICD-10-CM | POA: Diagnosis not present

## 2022-01-21 DIAGNOSIS — I428 Other cardiomyopathies: Secondary | ICD-10-CM

## 2022-01-21 DIAGNOSIS — Z950 Presence of cardiac pacemaker: Secondary | ICD-10-CM | POA: Diagnosis not present

## 2022-01-21 DIAGNOSIS — R0602 Shortness of breath: Secondary | ICD-10-CM | POA: Insufficient documentation

## 2022-01-21 DIAGNOSIS — G4733 Obstructive sleep apnea (adult) (pediatric): Secondary | ICD-10-CM | POA: Diagnosis not present

## 2022-01-21 DIAGNOSIS — I110 Hypertensive heart disease with heart failure: Secondary | ICD-10-CM | POA: Diagnosis not present

## 2022-01-21 DIAGNOSIS — I5022 Chronic systolic (congestive) heart failure: Secondary | ICD-10-CM

## 2022-01-21 DIAGNOSIS — I4819 Other persistent atrial fibrillation: Secondary | ICD-10-CM

## 2022-01-21 DIAGNOSIS — I498 Other specified cardiac arrhythmias: Secondary | ICD-10-CM | POA: Diagnosis not present

## 2022-01-21 LAB — COMPREHENSIVE METABOLIC PANEL
ALT: 15 U/L (ref 0–44)
AST: 24 U/L (ref 15–41)
Albumin: 3.8 g/dL (ref 3.5–5.0)
Alkaline Phosphatase: 136 U/L — ABNORMAL HIGH (ref 38–126)
Anion gap: 11 (ref 5–15)
BUN: 26 mg/dL — ABNORMAL HIGH (ref 8–23)
CO2: 28 mmol/L (ref 22–32)
Calcium: 9.7 mg/dL (ref 8.9–10.3)
Chloride: 96 mmol/L — ABNORMAL LOW (ref 98–111)
Creatinine, Ser: 1.07 mg/dL — ABNORMAL HIGH (ref 0.44–1.00)
GFR, Estimated: 55 mL/min — ABNORMAL LOW (ref >60)
Glucose, Bld: 95 mg/dL (ref 70–99)
Potassium: 3.8 mmol/L (ref 3.5–5.1)
Sodium: 135 mmol/L (ref 135–145)
Total Bilirubin: 0.7 mg/dL (ref 0.3–1.2)
Total Protein: 8.2 g/dL — ABNORMAL HIGH (ref 6.5–8.1)

## 2022-01-21 LAB — CBC
HCT: 44.6 % (ref 36.0–46.0)
Hemoglobin: 14.8 g/dL (ref 12.0–15.0)
MCH: 31.2 pg (ref 26.0–34.0)
MCHC: 33.2 g/dL (ref 30.0–36.0)
MCV: 94.1 fL (ref 80.0–100.0)
Platelets: 98 10*3/uL — ABNORMAL LOW (ref 150–400)
RBC: 4.74 MIL/uL (ref 3.87–5.11)
RDW: 14.1 % (ref 11.5–15.5)
WBC: 5.1 10*3/uL (ref 4.0–10.5)
nRBC: 0 % (ref 0.0–0.2)

## 2022-01-21 LAB — TSH: TSH: 1.885 u[IU]/mL (ref 0.350–4.500)

## 2022-01-21 LAB — T4, FREE: Free T4: 1.37 ng/dL — ABNORMAL HIGH (ref 0.61–1.12)

## 2022-01-21 NOTE — Progress Notes (Signed)
ReDS Vest / Clip - 01/21/22 1100       ReDS Vest / Clip   Station Marker A    Ruler Value 28    ReDS Value Range Low volume    ReDS Actual Value 35

## 2022-01-21 NOTE — Patient Instructions (Signed)
Medication Changes:  No changes to your medication    Lab Work:  Labs done today, your results will be available in MyChart, we will contact you for abnormal readings.   Testing/Procedures:  none  Referrals:  none  Special Instructions // Education:  none  Follow-Up in: 6 months (July 2023)  ** please call the office in May to make your follow up appointment**  At the [facility], you and your health needs are our priority. We have a designated team specialized in the treatment of Heart Failure. This Care Team includes your primary Heart Failure Specialized Cardiologist (physician), Advanced Practice Providers (APPs- Physician Assistants and Nurse Practitioners), and Pharmacist who all work together to provide you with the care you need, when you need it.   You may see any of the following providers on your designated Care Team at your next follow up:  Dr Arvilla Meres Dr Carron Curie, NP Robbie Lis, [facility] [place], [place] Karle Plumber, PharmD   Please be sure to bring in all your medications bottles to every appointment.   Need to Contact us:  If you have any questions or concerns before your next appointment please send [place] a message through [place] or call our office at [phone].    TO LEAVE A MESSAGE FOR THE NURSE SELECT OPTION 2, PLEASE LEAVE A MESSAGE INCLUDING: YOUR NAME DATE OF BIRTH CALL BACK NUMBER REASON FOR CALL**this is important as we prioritize the call backs  YOU WILL RECEIVE A CALL BACK THE SAME DAY AS LONG AS YOU CALL BEFORE 4:00 PM

## 2022-01-22 ENCOUNTER — Other Ambulatory Visit (HOSPITAL_COMMUNITY): Payer: Self-pay

## 2022-01-22 LAB — CUP PACEART REMOTE DEVICE CHECK
Battery Remaining Longevity: 138 mo
Battery Remaining Percentage: 95.5 %
Battery Voltage: 3.04 V
Brady Statistic RV Percent Paced: 2.8 %
Date Time Interrogation Session: 20230206100759
Implantable Lead Implant Date: 19920327
Implantable Lead Implant Date: 19920327
Implantable Lead Location: 753859
Implantable Lead Location: 753860
Implantable Pulse Generator Implant Date: 20220707
Lead Channel Impedance Value: 540 Ohm
Lead Channel Pacing Threshold Amplitude: 1.25 V
Lead Channel Pacing Threshold Pulse Width: 0.7 ms
Lead Channel Sensing Intrinsic Amplitude: 9.2 mV
Lead Channel Setting Pacing Amplitude: 2.5 V
Lead Channel Setting Pacing Pulse Width: 0.7 ms
Lead Channel Setting Sensing Sensitivity: 0.7 mV
Pulse Gen Model: 2272
Pulse Gen Serial Number: 3936106

## 2022-01-22 LAB — T3, FREE: T3, Free: 2.4 pg/mL (ref 2.0–4.4)

## 2022-01-24 NOTE — Progress Notes (Signed)
Remote pacemaker transmission.   

## 2022-01-24 NOTE — Progress Notes (Signed)
I p/u pts bubble packs from pharmacy and took them to her.  Due to her agreeing with mail order pharmacy from insurance when they called weeks ago, they filled some of her meds, so [facility] could not fill the spiro and amio to put in bubble pack.  I got those 2 meds out from the delivery package and advised her to take them plus the ones in her bubble packs.   Will f/u in a few wks.   [name], EMT-Paramedic  01/24/22

## 2022-01-30 ENCOUNTER — Other Ambulatory Visit (HOSPITAL_COMMUNITY): Payer: Self-pay | Admitting: Family Medicine

## 2022-01-30 DIAGNOSIS — I519 Heart disease, unspecified: Secondary | ICD-10-CM

## 2022-01-31 ENCOUNTER — Other Ambulatory Visit: Payer: Self-pay | Admitting: Internal Medicine

## 2022-02-23 ENCOUNTER — Emergency Department (HOSPITAL_COMMUNITY): Payer: Medicare HMO

## 2022-02-23 ENCOUNTER — Encounter (HOSPITAL_COMMUNITY): Payer: Self-pay

## 2022-02-23 ENCOUNTER — Other Ambulatory Visit: Payer: Self-pay

## 2022-02-23 ENCOUNTER — Emergency Department (HOSPITAL_COMMUNITY)
Admission: EM | Admit: 2022-02-23 | Discharge: 2022-02-23 | Disposition: A | Discharge status: Home / Self Care | Payer: Medicare HMO | Attending: Emergency Medicine | Admitting: Emergency Medicine

## 2022-02-23 DIAGNOSIS — M47812 Spondylosis without myelopathy or radiculopathy, cervical region: Secondary | ICD-10-CM | POA: Diagnosis not present

## 2022-02-23 DIAGNOSIS — I10 Essential (primary) hypertension: Secondary | ICD-10-CM | POA: Diagnosis not present

## 2022-02-23 DIAGNOSIS — S01111A Laceration without foreign body of right eyelid and periocular area, initial encounter: Secondary | ICD-10-CM | POA: Diagnosis not present

## 2022-02-23 DIAGNOSIS — G40909 Epilepsy, unspecified, not intractable, without status epilepticus: Secondary | ICD-10-CM | POA: Diagnosis not present

## 2022-02-23 DIAGNOSIS — W01198A Fall on same level from slipping, tripping and stumbling with subsequent striking against other object, initial encounter: Secondary | ICD-10-CM | POA: Insufficient documentation

## 2022-02-23 DIAGNOSIS — S0191XA Laceration without foreign body of unspecified part of head, initial encounter: Secondary | ICD-10-CM | POA: Diagnosis not present

## 2022-02-23 DIAGNOSIS — Z043 Encounter for examination and observation following other accident: Secondary | ICD-10-CM | POA: Diagnosis not present

## 2022-02-23 DIAGNOSIS — Z79899 Other long term (current) drug therapy: Secondary | ICD-10-CM | POA: Diagnosis not present

## 2022-02-23 DIAGNOSIS — Z950 Presence of cardiac pacemaker: Secondary | ICD-10-CM | POA: Diagnosis not present

## 2022-02-23 DIAGNOSIS — Z7901 Long term (current) use of anticoagulants: Secondary | ICD-10-CM | POA: Insufficient documentation

## 2022-02-23 DIAGNOSIS — R0902 Hypoxemia: Secondary | ICD-10-CM | POA: Diagnosis not present

## 2022-02-23 DIAGNOSIS — R000 Tachycardia, unspecified: Secondary | ICD-10-CM | POA: Diagnosis not present

## 2022-02-23 DIAGNOSIS — I482 Chronic atrial fibrillation, unspecified: Secondary | ICD-10-CM | POA: Insufficient documentation

## 2022-02-23 DIAGNOSIS — S0993XA Unspecified injury of face, initial encounter: Secondary | ICD-10-CM | POA: Diagnosis present

## 2022-02-23 DIAGNOSIS — R569 Unspecified convulsions: Secondary | ICD-10-CM

## 2022-02-23 LAB — BASIC METABOLIC PANEL
Anion gap: 10 (ref 5–15)
BUN: 28 mg/dL — ABNORMAL HIGH (ref 8–23)
CO2: 27 mmol/L (ref 22–32)
Calcium: 9.6 mg/dL (ref 8.9–10.3)
Chloride: 98 mmol/L (ref 98–111)
Creatinine, Ser: 1.1 mg/dL — ABNORMAL HIGH (ref 0.44–1.00)
GFR, Estimated: 53 mL/min — ABNORMAL LOW (ref >60)
Glucose, Bld: 114 mg/dL — ABNORMAL HIGH (ref 70–99)
Potassium: 4 mmol/L (ref 3.5–5.1)
Sodium: 135 mmol/L (ref 135–145)

## 2022-02-23 LAB — CBC WITH DIFFERENTIAL/PLATELET
Abs Immature Granulocytes: 0.02 10*3/uL (ref 0.00–0.07)
Basophils Absolute: 0 10*3/uL (ref 0.0–0.1)
Basophils Relative: 0 %
Eosinophils Absolute: 0 10*3/uL (ref 0.0–0.5)
Eosinophils Relative: 0 %
HCT: 40.8 % (ref 36.0–46.0)
Hemoglobin: 13.3 g/dL (ref 12.0–15.0)
Immature Granulocytes: 0 %
Lymphocytes Relative: 9 %
Lymphs Abs: 0.6 10*3/uL — ABNORMAL LOW (ref 0.7–4.0)
MCH: 31 pg (ref 26.0–34.0)
MCHC: 32.6 g/dL (ref 30.0–36.0)
MCV: 95.1 fL (ref 80.0–100.0)
Monocytes Absolute: 0.4 10*3/uL (ref 0.1–1.0)
Monocytes Relative: 5 %
Neutro Abs: 5.9 10*3/uL (ref 1.7–7.7)
Neutrophils Relative %: 86 %
Platelets: 114 10*3/uL — ABNORMAL LOW (ref 150–400)
RBC: 4.29 MIL/uL (ref 3.87–5.11)
RDW: 14.9 % (ref 11.5–15.5)
WBC: 6.9 10*3/uL (ref 4.0–10.5)
nRBC: 0 % (ref 0.0–0.2)

## 2022-02-23 LAB — URINALYSIS, ROUTINE W REFLEX MICROSCOPIC
Bilirubin Urine: NEGATIVE
Glucose, UA: NEGATIVE mg/dL
Ketones, ur: NEGATIVE mg/dL
Nitrite: NEGATIVE
Protein, ur: 30 mg/dL — AB
Specific Gravity, Urine: 1.018 (ref 1.005–1.030)
pH: 5 (ref 5.0–8.0)

## 2022-02-23 LAB — PHENOBARBITAL LEVEL: Phenobarbital: 17 ug/mL (ref 15.0–30.0)

## 2022-02-23 MED ORDER — LIDOCAINE-EPINEPHRINE 2 %-1:200000 IJ SOLN
10.0000 mL | Freq: Once | INTRAMUSCULAR | Status: AC
Start: 2022-02-23 — End: 2022-02-23
  Administered 2022-02-23: 10 mL
  Filled 2022-02-23: qty 20

## 2022-02-23 NOTE — ED Notes (Signed)
Son Shruti Arrey [phone] would like an update ?

## 2022-02-23 NOTE — ED Provider Notes (Signed)
[facility] EMERGENCY DEPARTMENT Provider Note   CSN: [id number] Arrival date & time: 02/23/22  1215     History  Chief Complaint  Patient presents with   Seizures    Misty Gray is a 71 y.o. female.  The patient presents to the emergency department via EMS after experiencing a seizure. The patient states that she felt a "dizzy" feeling and tried to get to the couch. She began to lose balance and fell, hitting her refrigerator on the way down. She has a laceration over her right eye. She does not complain of head or neck pain. She denies loss of consciousness and states she was aware during the seizure which lasted for less than 2 minutes. She denies recent illness. CBG was 113 per EMS. Patient denies recent fevers, dysuria, or medication changes. She states that she takes phenobarbital for seizures and is consistent with her medication. Her medication is packaged in "pill packs" so she feels that she has missed no doses. She takes [facility] for anticoagulation. The patient has PMH significant for seizure disorder, chronic systolic dysfunction of left ventricle, HTN, chronic anticoagulation, frequent PVCs, chronic venous stasis dermatitis, pacemaker, SA node dysfunction, GERD, chronic atrial fibrillation, hypokalemia, UTI, thrombocytopenia  HPI     Home Medications Prior to Admission medications   Medication Sig Start Date End Date Taking? Authorizing Provider  amiodarone (PACERONE) 200 MG tablet Take 0.5 tablets (100 mg total) by mouth daily. 01/14/22   [name], Shaune Pascal, MD  D3-1000 25 MCG (1000 UT) tablet TAKE 1 TABLET BY MOUTH DAILY (AM) 01/17/22   Zenia Resides, MD  fluticasone (FLONASE) 50 MCG/ACT nasal spray Place 2 sprays into both nostrils daily as needed. For congestion. 10/16/21   [name], MD  omeprazole (PRILOSEC) 20 MG capsule TAKE ONE CAPSULE BY MOUTH EVERY MORNING 01/17/22   Zenia Resides, MD  PHENobarbital (LUMINAL) 64.8 MG tablet TAKE  ONE TABLET BY MOUTH TWICE DAILY (AM+BEDTIME) 01/17/22   Hensel, Jamal Collin, MD  potassium chloride (KLOR-CON) 10 MEQ tablet Take 40 mEq by mouth 3 (three) times daily. 3 tablets @HS     [provider]  SAVAYSA 60 MG TABS tablet TAKE ONE TABLET BY MOUTH ONCE DAILY (AM) 01/17/22   Milford, Maricela Bo, FNP  spironolactone (ALDACTONE) 25 MG tablet TAKE 1 TABLET (25 MG TOTAL) BY MOUTH DAILY (AM) 01/14/22   [name], Shaune Pascal, MD  torsemide (DEMADEX) 20 MG tablet TAKE 4 TABLETS BY MOUTH EVERY MORNING AND 3 TABLETS EVERY EVENING 09/26/21   [name], Shaune Pascal, MD      Allergies    Aspirin, Penicillins, Tape, Wool alcohol [lanolin], Amoxicillin, and Ampicillin    Review of Systems   Review of Systems  Constitutional:  Negative for fever.  Eyes:  Negative for visual disturbance.  Respiratory:  Negative for shortness of breath.   Cardiovascular:  Negative for chest pain.  Gastrointestinal:  Negative for abdominal pain.  Genitourinary:  Negative for difficulty urinating and dysuria.  Musculoskeletal:  Negative for neck pain.  Neurological:  Positive for seizures. Negative for syncope and numbness.   Physical Exam Updated Vital Signs BP 107/87    Pulse 74    Temp 97.8 F (36.6 C) (Oral)    Resp 20    Ht 4\' 9"  (1.448 m)    Wt 66.8 kg    SpO2 96%    BMI 31.87 kg/m  Physical Exam Constitutional:      General: She is not in acute distress.  HENT:     Head: Normocephalic.     Mouth/Throat:     Mouth: Mucous membranes are moist.  Eyes:     Extraocular Movements: Extraocular movements intact.     Pupils: Pupils are equal, round, and reactive to light.  Cardiovascular:     Rate and Rhythm: Normal rate. Rhythm irregular.     Pulses: Normal pulses.  Pulmonary:     Effort: Pulmonary effort is normal. No respiratory distress.     Breath sounds: Normal breath sounds.  Abdominal:     Palpations: Abdomen is soft.     Tenderness: There is no abdominal tenderness.  Musculoskeletal:        General:  No tenderness.     Cervical back: Normal range of motion and neck supple. No tenderness.  Skin:    General: Skin is warm and dry.     Comments: 1 cm laceration above right eye  Neurological:     Mental Status: She is alert and oriented to person, place, and time.     Sensory: Sensation is intact.     Motor: Motor function is intact. No weakness.     Comments: CN II-VII, XI, XII intact    ED Results / Procedures / Treatments   Labs (all labs ordered are listed, but only abnormal results are displayed) Labs Reviewed  BASIC METABOLIC PANEL - Abnormal; Notable for the following components:      Result Value   Glucose, Bld 114 (*)    BUN 28 (*)    Creatinine, Ser 1.10 (*)    GFR, Estimated 53 (*)    All other components within normal limits  CBC WITH DIFFERENTIAL/PLATELET - Abnormal; Notable for the following components:   Platelets 114 (*)    Lymphs Abs 0.6 (*)    All other components within normal limits  URINALYSIS, ROUTINE W REFLEX MICROSCOPIC - Abnormal; Notable for the following components:   Color, Urine AMBER (*)    APPearance CLOUDY (*)    Hgb urine dipstick SMALL (*)    Protein, ur 30 (*)    Leukocytes,Ua MODERATE (*)    Bacteria, UA RARE (*)    All other components within normal limits  PHENOBARBITAL LEVEL    EKG EKG Interpretation  Date/Time:  Saturday February 23 2022 12:29:04 EST Ventricular Rate:  80 PR Interval:    QRS Duration: 104 QT Interval:  359 QTC Calculation: 404 R Axis:   -45 Text Interpretation: Atrial fibrillation Inferior infarct, old No significant change since last tracing Confirmed by Isla Pence [phone]) on 02/23/2022 12:30:43 PM  Radiology CT Head Wo Contrast  Result Date: 02/23/2022 CLINICAL DATA:  Seizure this morning. Fall, with laceration to right side of head. Weakness. EXAM: CT HEAD WITHOUT CONTRAST CT CERVICAL SPINE WITHOUT CONTRAST TECHNIQUE: Multidetector CT imaging of the head and cervical spine was performed following the  standard protocol without intravenous contrast. Multiplanar CT image reconstructions of the cervical spine were also generated. RADIATION DOSE REDUCTION: This exam was performed according to the departmental dose-optimization program which includes automated exposure control, adjustment of the mA and/or kV according to patient size and/or use of iterative reconstruction technique. COMPARISON:  05/02/2021 FINDINGS: CT HEAD FINDINGS Brain: No evidence of intracranial hemorrhage, acute infarction, hydrocephalus, extra-axial collection, or mass lesion/mass effect. Stable mild-to-moderate cerebral atrophy. 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 Vascular:  No hyperdense vessel or other acute findings. Skull: No evidence of fracture or other significant bone abnormality. Sinuses/Orbits:  No acute findings. Other: None. CT CERVICAL SPINE FINDINGS Alignment: Normal. Skull base and vertebrae: No acute fracture. No primary bone lesion or focal pathologic process. Soft tissues and spinal canal: No prevertebral fluid or swelling. No visible canal hematoma. Disc levels: No disc space narrowing. Mild-to-moderate facet DJD is seen bilaterally from levels of C2-C5. Atlantoaxial degenerative changes are also seen. Upper chest: Bilateral pleural effusions are seen. Diffuse interstitial prominence also seen in the visualized upper lung zones suspicious for interstitial edema. Other: None. IMPRESSION: No acute intracranial abnormality. 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 No evidence of acute cervical spine fracture or subluxation. Degenerative spondylosis, as described above. Bilateral pleural effusions. Diffuse interstitial prominence in visualized upper lung zones, suspicious for interstitial edema. Electronically Signed   By: Marlaine Hind M.D.   On: 02/23/2022 13:49   CT Cervical Spine Wo Contrast  Result Date: 02/23/2022 CLINICAL DATA:   Seizure this morning. Fall, with laceration to right side of head. Weakness. EXAM: CT HEAD WITHOUT CONTRAST CT CERVICAL SPINE WITHOUT CONTRAST TECHNIQUE: Multidetector CT imaging of the head and cervical spine was performed following the standard protocol without intravenous contrast. Multiplanar CT image reconstructions of the cervical spine were also generated. RADIATION DOSE REDUCTION: This exam was performed according to the departmental dose-optimization program which includes automated exposure control, adjustment of the mA and/or kV according to patient size and/or use of iterative reconstruction technique. COMPARISON:  05/02/2021 FINDINGS: CT HEAD FINDINGS Brain: No evidence of intracranial hemorrhage, acute infarction, hydrocephalus, extra-axial collection, or mass lesion/mass effect. Stable mild-to-moderate cerebral atrophy. 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 Vascular:  No hyperdense vessel or other acute findings. Skull: No evidence of fracture or other significant bone abnormality. Sinuses/Orbits:  No acute findings. Other: None. CT CERVICAL SPINE FINDINGS Alignment: Normal. Skull base and vertebrae: No acute fracture. No primary bone lesion or focal pathologic process. Soft tissues and spinal canal: No prevertebral fluid or swelling. No visible canal hematoma. Disc levels: No disc space narrowing. Mild-to-moderate facet DJD is seen bilaterally from levels of C2-C5. Atlantoaxial degenerative changes are also seen. Upper chest: Bilateral pleural effusions are seen. Diffuse interstitial prominence also seen in the visualized upper lung zones suspicious for interstitial edema. Other: None. IMPRESSION: No acute intracranial abnormality. 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 No evidence of acute cervical spine fracture or subluxation. Degenerative spondylosis, as described above. Bilateral pleural effusions. Diffuse  interstitial prominence in visualized upper lung zones, suspicious for interstitial edema. Electronically Signed   By: Marlaine Hind M.D.   On: 02/23/2022 13:49    Procedures .Marland KitchenLaceration Repair  Date/Time: 02/23/2022 3:15 PM Performed by: Dorothyann Peng, PA-C Authorized by: Dorothyann Peng, PA-C   Consent:    Consent obtained:  Verbal   Consent given by:  Patient   Risks, benefits, and alternatives were discussed: yes     Risks discussed:  Pain, poor cosmetic result, need for additional repair and poor wound healing   Alternatives discussed:  No treatment Universal protocol:    Procedure explained and questions answered to patient or proxy's satisfaction: yes     Patient identity confirmed:  Verbally with patient Anesthesia:    Anesthesia method:  Local infiltration   Local anesthetic:  Lidocaine 2% WITH epi Laceration details:    Location:  Face   Face location:  R eyebrow   Length (cm):  2.7   Depth (mm):  0.3 Pre-procedure details:  Preparation:  Patient was prepped and draped in usual sterile fashion Exploration:    Limited defect created (wound extended): no     Wound exploration: entire depth of wound visualized     Contaminated: no   Treatment:    Area cleansed with:  Saline   Amount of cleaning:  Standard   Irrigation solution:  Sterile saline   Irrigation volume:  100 mL   Irrigation method:  Syringe   Visualized foreign bodies/material removed: yes   Skin repair:    Repair method:  Sutures   Suture size:  5-0   Suture material:  Nylon   Suture technique:  Simple interrupted   Number of sutures:  5 Approximation:    Approximation:  Close Repair type:    Repair type:  Intermediate Post-procedure details:    Dressing:  Open (no dressing)   Procedure completion:  Tolerated well, no immediate complications    Medications Ordered in ED Medications  lidocaine-EPINEPHrine (XYLOCAINE W/EPI) 2 %-1:200000 (PF) injection 10 mL (10 mLs Infiltration Given  02/23/22 1444)    ED Course/ Medical Decision Making/ A&P                           Medical Decision Making Amount and/or Complexity of Data Reviewed Labs: ordered. Radiology: ordered.  Risk Prescription drug management.   This patient presents to the ED for concern of seizure, this involves an extensive number of treatment options, and is a complaint that carries with it a high risk of complications and morbidity.  The differential diagnosis includes but is not limited to seizure with known seizure disorder, non-compliance with anti-seizure medication, hyponatremia, intracranial hemorrhage, and others.    Co morbidities that complicate the patient evaluation  Seizure history, chronic anti-coagulation   Additional history obtained:  Additional history obtained from EMS External records from outside source obtained and reviewed including ED notes from 10/06/21.   Lab Tests:  I Ordered, and personally interpreted labs.  The pertinent results include:  Phenobarbital level 17.0   Imaging Studies ordered:  I ordered imaging studies including CT head and CT c-spine  I independently visualized and interpreted imaging which showed no acute fracture or abnormality I agree with the radiologist interpretation   Cardiac Monitoring:  The patient was maintained on a cardiac monitor.  I personally viewed and interpreted the cardiac monitored which showed an underlying rhythm of: atrial fibrillation   Medicines ordered and prescription drug management:  I have reviewed the patients home medicines and have made adjustments as needed    After further discussion with the patient she believes that she missed her morning medication. The patient's workup was negative for electrolyte abnormalities, rare bacteria on UA with no urinary symptoms, negative head CT and c-spine CT. Her phenobarbital levels are within a normal limit. The laceration was repaired with 5 simple interrupted sutures.  The patient has had no further seizure activity. I see no reason for further imaging or lab work at this time. I believe the patient can safely discharge home. Follow up with PCP who manages seizure medication. Return precautions provided.   Final Clinical Impression(s) / ED Diagnoses Final diagnoses:  Seizure ([place])  Laceration of right eyebrow, initial encounter    Rx / DC Orders ED Discharge Orders     None         Ronny Bacon 02/23/22 1535    Isla Pence, MD 02/24/22 [phone]

## 2022-02-23 NOTE — ED Notes (Signed)
Trauma Event Note ? ? ? ?Reason for Call : ? ?Level 2 activation - arrived at 1215, fall on a new thinner -- SAVAYSA-- activated after med discovery and CT.  ?TRN not needed at this time. ? ? ?ILast imported Vital Signs ?BP 107/87   Pulse 74   Temp 97.8 ?F (36.6 ?C) (Oral)   Resp 20   Ht 4\' 9"  (1.448 m)   Wt 147 lb 4.3 oz (66.8 kg)   SpO2 96%   BMI 31.87 kg/m?  ? ?Trending CBC ?Recent Labs  ?  02/23/22 ?1232  ?WBC 6.9  ?HGB 13.3  ?HCT 40.8  ?PLT 114*  ? ? ?Trending Coag's ?No results for input(s): APTT, INR in the last 72 hours. ? ?Trending BMET ?Recent Labs  ?  02/23/22 ?1232  ?NA 135  ?K 4.0  ?CL 98  ?CO2 27  ?BUN 28*  ?CREATININE 1.10*  ?GLUCOSE 114*  ? ? ? ? ?04/25/22 Ivon Roedel  ?Trauma Response RN ? ?Please call TRN at [phone] for further assistance. ? ? ?  ?

## 2022-02-23 NOTE — ED Triage Notes (Signed)
Pt was at the refrigerator getting something out when she states she had a seizure woke up and blood was all over the refrigerator . Laceration to the left eyebrow bleeding controlled at this time  ?

## 2022-02-23 NOTE — Progress Notes (Signed)
Chaplain responded to Level II Trauma.  Visited w/ pt at bedside, offering ministry of presence as pt talked of having seen Jesus in person, the crown of thorns and his nail scarred hands.  Jesus asked if she was afraid of Him; she wasn't.  Chaplain acknowledged and celebrated pt's excitement over seeing Jesus again.   ? ?Misty Gray ?Chaplain ?

## 2022-02-23 NOTE — Progress Notes (Signed)
Orthopedic Tech Progress Note ?Patient Details:  ?Misty Gray ?Apr 23, 1951 ?[id number] ?Level 2 Trauma  ?Patient ID: Misty Gray, female   DOB: 1951-06-17, 71 y.o.   MRN: [id number] ? ?Misty Gray ?02/23/2022, 1:58 PM ? ?

## 2022-02-23 NOTE — ED Notes (Signed)
Placed seizure pads onto pts bed. ?

## 2022-02-23 NOTE — Discharge Instructions (Addendum)
You were seen today after experiencing a seizure. Your lab work was unremarkable today. The imaging shows no acute bleed or fracture. I believe you are safe to go home at this time. I do recommend follow up with your PCP since they manage your anti-seizure medications. I've included care instructions for your laceration. Sutures should be removed in 7 days and can be removed by any medical provider. Return to the emergency department if you develop life threatening conditions such as chest pain, shortness of breath, or altered level of consciousness ?

## 2022-03-06 ENCOUNTER — Telehealth (HOSPITAL_COMMUNITY): Payer: Self-pay

## 2022-03-06 NOTE — Telephone Encounter (Signed)
Attempted to contact pt regarding home visit, her phone goes straight to VM. I know she doesn't check VM's so I didn't even leave one. Will try again later.  ? ?Kerry Hough, EMT-Paramedic  ?[phone] ?03/06/2022 ? ?

## 2022-03-07 ENCOUNTER — Encounter (HOSPITAL_COMMUNITY): Payer: Self-pay | Admitting: Emergency Medicine

## 2022-03-07 ENCOUNTER — Other Ambulatory Visit: Payer: Self-pay

## 2022-03-07 ENCOUNTER — Inpatient Hospital Stay (HOSPITAL_COMMUNITY)
Admission: EM | Admit: 2022-03-07 | Discharge: 2022-03-14 | DRG: 291 | Disposition: A | Discharge status: Skilled Nursing Facility | Payer: Medicare HMO | Attending: Family Medicine | Admitting: Family Medicine

## 2022-03-07 DIAGNOSIS — I5021 Acute systolic (congestive) heart failure: Secondary | ICD-10-CM | POA: Diagnosis not present

## 2022-03-07 DIAGNOSIS — E876 Hypokalemia: Secondary | ICD-10-CM | POA: Diagnosis present

## 2022-03-07 DIAGNOSIS — R0902 Hypoxemia: Secondary | ICD-10-CM | POA: Diagnosis not present

## 2022-03-07 DIAGNOSIS — I509 Heart failure, unspecified: Secondary | ICD-10-CM

## 2022-03-07 DIAGNOSIS — E7800 Pure hypercholesterolemia, unspecified: Secondary | ICD-10-CM | POA: Diagnosis present

## 2022-03-07 DIAGNOSIS — S0181XD Laceration without foreign body of other part of head, subsequent encounter: Secondary | ICD-10-CM

## 2022-03-07 DIAGNOSIS — I429 Cardiomyopathy, unspecified: Secondary | ICD-10-CM | POA: Diagnosis not present

## 2022-03-07 DIAGNOSIS — Z886 Allergy status to analgesic agent status: Secondary | ICD-10-CM

## 2022-03-07 DIAGNOSIS — G40909 Epilepsy, unspecified, not intractable, without status epilepticus: Secondary | ICD-10-CM | POA: Diagnosis present

## 2022-03-07 DIAGNOSIS — F4320 Adjustment disorder, unspecified: Secondary | ICD-10-CM | POA: Diagnosis present

## 2022-03-07 DIAGNOSIS — Z87891 Personal history of nicotine dependence: Secondary | ICD-10-CM

## 2022-03-07 DIAGNOSIS — J449 Chronic obstructive pulmonary disease, unspecified: Secondary | ICD-10-CM | POA: Diagnosis present

## 2022-03-07 DIAGNOSIS — M6281 Muscle weakness (generalized): Secondary | ICD-10-CM | POA: Diagnosis not present

## 2022-03-07 DIAGNOSIS — I872 Venous insufficiency (chronic) (peripheral): Secondary | ICD-10-CM | POA: Diagnosis present

## 2022-03-07 DIAGNOSIS — I4821 Permanent atrial fibrillation: Secondary | ICD-10-CM | POA: Diagnosis present

## 2022-03-07 DIAGNOSIS — R1312 Dysphagia, oropharyngeal phase: Secondary | ICD-10-CM | POA: Diagnosis not present

## 2022-03-07 DIAGNOSIS — Z634 Disappearance and death of family member: Secondary | ICD-10-CM

## 2022-03-07 DIAGNOSIS — W19XXXD Unspecified fall, subsequent encounter: Secondary | ICD-10-CM | POA: Diagnosis present

## 2022-03-07 DIAGNOSIS — I5043 Acute on chronic combined systolic (congestive) and diastolic (congestive) heart failure: Secondary | ICD-10-CM | POA: Diagnosis not present

## 2022-03-07 DIAGNOSIS — I495 Sick sinus syndrome: Secondary | ICD-10-CM | POA: Diagnosis present

## 2022-03-07 DIAGNOSIS — I6529 Occlusion and stenosis of unspecified carotid artery: Secondary | ICD-10-CM | POA: Diagnosis not present

## 2022-03-07 DIAGNOSIS — G4733 Obstructive sleep apnea (adult) (pediatric): Secondary | ICD-10-CM | POA: Diagnosis present

## 2022-03-07 DIAGNOSIS — Z91018 Allergy to other foods: Secondary | ICD-10-CM | POA: Diagnosis not present

## 2022-03-07 DIAGNOSIS — R0602 Shortness of breath: Secondary | ICD-10-CM | POA: Diagnosis not present

## 2022-03-07 DIAGNOSIS — I472 Ventricular tachycardia, unspecified: Secondary | ICD-10-CM | POA: Diagnosis not present

## 2022-03-07 DIAGNOSIS — I519 Heart disease, unspecified: Secondary | ICD-10-CM

## 2022-03-07 DIAGNOSIS — I5031 Acute diastolic (congestive) heart failure: Secondary | ICD-10-CM | POA: Diagnosis not present

## 2022-03-07 DIAGNOSIS — I110 Hypertensive heart disease with heart failure: Secondary | ICD-10-CM | POA: Diagnosis not present

## 2022-03-07 DIAGNOSIS — D696 Thrombocytopenia, unspecified: Secondary | ICD-10-CM | POA: Diagnosis not present

## 2022-03-07 DIAGNOSIS — R42 Dizziness and giddiness: Secondary | ICD-10-CM | POA: Diagnosis not present

## 2022-03-07 DIAGNOSIS — I5023 Acute on chronic systolic (congestive) heart failure: Secondary | ICD-10-CM | POA: Diagnosis not present

## 2022-03-07 DIAGNOSIS — Z91048 Other nonmedicinal substance allergy status: Secondary | ICD-10-CM | POA: Diagnosis not present

## 2022-03-07 DIAGNOSIS — Z880 Allergy status to penicillin: Secondary | ICD-10-CM

## 2022-03-07 DIAGNOSIS — R2689 Other abnormalities of gait and mobility: Secondary | ICD-10-CM | POA: Diagnosis not present

## 2022-03-07 DIAGNOSIS — R000 Tachycardia, unspecified: Secondary | ICD-10-CM | POA: Diagnosis not present

## 2022-03-07 DIAGNOSIS — Z8673 Personal history of transient ischemic attack (TIA), and cerebral infarction without residual deficits: Secondary | ICD-10-CM

## 2022-03-07 DIAGNOSIS — R0600 Dyspnea, unspecified: Secondary | ICD-10-CM | POA: Diagnosis not present

## 2022-03-07 DIAGNOSIS — K219 Gastro-esophageal reflux disease without esophagitis: Secondary | ICD-10-CM | POA: Diagnosis present

## 2022-03-07 DIAGNOSIS — Z7401 Bed confinement status: Secondary | ICD-10-CM | POA: Diagnosis not present

## 2022-03-07 DIAGNOSIS — G9389 Other specified disorders of brain: Secondary | ICD-10-CM | POA: Diagnosis not present

## 2022-03-07 DIAGNOSIS — Z66 Do not resuscitate: Secondary | ICD-10-CM | POA: Diagnosis not present

## 2022-03-07 DIAGNOSIS — I10 Essential (primary) hypertension: Secondary | ICD-10-CM | POA: Diagnosis not present

## 2022-03-07 DIAGNOSIS — I959 Hypotension, unspecified: Secondary | ICD-10-CM | POA: Diagnosis present

## 2022-03-07 DIAGNOSIS — R41841 Cognitive communication deficit: Secondary | ICD-10-CM | POA: Diagnosis not present

## 2022-03-07 DIAGNOSIS — I428 Other cardiomyopathies: Secondary | ICD-10-CM | POA: Diagnosis not present

## 2022-03-07 DIAGNOSIS — Z20822 Contact with and (suspected) exposure to covid-19: Secondary | ICD-10-CM | POA: Diagnosis not present

## 2022-03-07 DIAGNOSIS — Z823 Family history of stroke: Secondary | ICD-10-CM

## 2022-03-07 DIAGNOSIS — Z950 Presence of cardiac pacemaker: Secondary | ICD-10-CM

## 2022-03-07 DIAGNOSIS — J811 Chronic pulmonary edema: Secondary | ICD-10-CM | POA: Diagnosis not present

## 2022-03-07 DIAGNOSIS — J90 Pleural effusion, not elsewhere classified: Secondary | ICD-10-CM | POA: Diagnosis not present

## 2022-03-07 DIAGNOSIS — I493 Ventricular premature depolarization: Secondary | ICD-10-CM | POA: Diagnosis not present

## 2022-03-07 DIAGNOSIS — I517 Cardiomegaly: Secondary | ICD-10-CM | POA: Diagnosis not present

## 2022-03-07 DIAGNOSIS — Z9981 Dependence on supplemental oxygen: Secondary | ICD-10-CM | POA: Diagnosis not present

## 2022-03-07 DIAGNOSIS — Z79899 Other long term (current) drug therapy: Secondary | ICD-10-CM

## 2022-03-07 DIAGNOSIS — Z8249 Family history of ischemic heart disease and other diseases of the circulatory system: Secondary | ICD-10-CM

## 2022-03-07 DIAGNOSIS — R2681 Unsteadiness on feet: Secondary | ICD-10-CM | POA: Diagnosis not present

## 2022-03-07 DIAGNOSIS — R4789 Other speech disturbances: Secondary | ICD-10-CM | POA: Diagnosis not present

## 2022-03-07 DIAGNOSIS — J9811 Atelectasis: Secondary | ICD-10-CM | POA: Diagnosis not present

## 2022-03-07 DIAGNOSIS — R531 Weakness: Secondary | ICD-10-CM | POA: Diagnosis not present

## 2022-03-07 DIAGNOSIS — I878 Other specified disorders of veins: Secondary | ICD-10-CM | POA: Diagnosis present

## 2022-03-07 NOTE — ED Triage Notes (Signed)
Pt arrive by EMS from home for c/o SOB and mid cp for the past 2 days getting worse today. 5 of Albuterol and 125mg  Solumedrol given by EMS pta. Pt is AO x 4 on arrival Nad noticed. ?

## 2022-03-08 ENCOUNTER — Emergency Department (HOSPITAL_COMMUNITY): Payer: Medicare HMO

## 2022-03-08 ENCOUNTER — Inpatient Hospital Stay (HOSPITAL_COMMUNITY): Payer: Medicare HMO

## 2022-03-08 DIAGNOSIS — I5021 Acute systolic (congestive) heart failure: Secondary | ICD-10-CM | POA: Diagnosis not present

## 2022-03-08 DIAGNOSIS — I5043 Acute on chronic combined systolic (congestive) and diastolic (congestive) heart failure: Secondary | ICD-10-CM | POA: Diagnosis not present

## 2022-03-08 DIAGNOSIS — I509 Heart failure, unspecified: Secondary | ICD-10-CM

## 2022-03-08 DIAGNOSIS — I5023 Acute on chronic systolic (congestive) heart failure: Secondary | ICD-10-CM

## 2022-03-08 DIAGNOSIS — G40909 Epilepsy, unspecified, not intractable, without status epilepticus: Secondary | ICD-10-CM | POA: Diagnosis present

## 2022-03-08 DIAGNOSIS — Z66 Do not resuscitate: Secondary | ICD-10-CM | POA: Diagnosis present

## 2022-03-08 DIAGNOSIS — Z91048 Other nonmedicinal substance allergy status: Secondary | ICD-10-CM | POA: Diagnosis not present

## 2022-03-08 DIAGNOSIS — I959 Hypotension, unspecified: Secondary | ICD-10-CM | POA: Diagnosis present

## 2022-03-08 DIAGNOSIS — I5031 Acute diastolic (congestive) heart failure: Secondary | ICD-10-CM

## 2022-03-08 DIAGNOSIS — Z8673 Personal history of transient ischemic attack (TIA), and cerebral infarction without residual deficits: Secondary | ICD-10-CM | POA: Diagnosis not present

## 2022-03-08 DIAGNOSIS — Z634 Disappearance and death of family member: Secondary | ICD-10-CM | POA: Diagnosis not present

## 2022-03-08 DIAGNOSIS — F4320 Adjustment disorder, unspecified: Secondary | ICD-10-CM | POA: Diagnosis present

## 2022-03-08 DIAGNOSIS — I493 Ventricular premature depolarization: Secondary | ICD-10-CM | POA: Diagnosis not present

## 2022-03-08 DIAGNOSIS — I429 Cardiomyopathy, unspecified: Secondary | ICD-10-CM | POA: Diagnosis not present

## 2022-03-08 DIAGNOSIS — Z20822 Contact with and (suspected) exposure to covid-19: Secondary | ICD-10-CM | POA: Diagnosis present

## 2022-03-08 DIAGNOSIS — I4821 Permanent atrial fibrillation: Secondary | ICD-10-CM | POA: Diagnosis present

## 2022-03-08 DIAGNOSIS — Z91018 Allergy to other foods: Secondary | ICD-10-CM | POA: Diagnosis not present

## 2022-03-08 DIAGNOSIS — I110 Hypertensive heart disease with heart failure: Secondary | ICD-10-CM | POA: Diagnosis present

## 2022-03-08 DIAGNOSIS — G4733 Obstructive sleep apnea (adult) (pediatric): Secondary | ICD-10-CM | POA: Diagnosis present

## 2022-03-08 DIAGNOSIS — I495 Sick sinus syndrome: Secondary | ICD-10-CM | POA: Diagnosis present

## 2022-03-08 DIAGNOSIS — Z880 Allergy status to penicillin: Secondary | ICD-10-CM | POA: Diagnosis not present

## 2022-03-08 DIAGNOSIS — Z886 Allergy status to analgesic agent status: Secondary | ICD-10-CM | POA: Diagnosis not present

## 2022-03-08 DIAGNOSIS — D696 Thrombocytopenia, unspecified: Secondary | ICD-10-CM | POA: Diagnosis present

## 2022-03-08 DIAGNOSIS — Z9981 Dependence on supplemental oxygen: Secondary | ICD-10-CM | POA: Diagnosis not present

## 2022-03-08 DIAGNOSIS — I472 Ventricular tachycardia, unspecified: Secondary | ICD-10-CM | POA: Diagnosis present

## 2022-03-08 DIAGNOSIS — J449 Chronic obstructive pulmonary disease, unspecified: Secondary | ICD-10-CM | POA: Diagnosis present

## 2022-03-08 DIAGNOSIS — Z950 Presence of cardiac pacemaker: Secondary | ICD-10-CM | POA: Diagnosis not present

## 2022-03-08 DIAGNOSIS — I872 Venous insufficiency (chronic) (peripheral): Secondary | ICD-10-CM | POA: Diagnosis present

## 2022-03-08 DIAGNOSIS — E876 Hypokalemia: Secondary | ICD-10-CM | POA: Diagnosis present

## 2022-03-08 DIAGNOSIS — I428 Other cardiomyopathies: Secondary | ICD-10-CM | POA: Diagnosis present

## 2022-03-08 DIAGNOSIS — W19XXXD Unspecified fall, subsequent encounter: Secondary | ICD-10-CM | POA: Diagnosis present

## 2022-03-08 LAB — ECHOCARDIOGRAM COMPLETE
AR max vel: 1.36 cm2
AV Peak grad: 6.2 mmHg
Ao pk vel: 1.24 m/s
Area-P 1/2: 5.7 cm2
Calc EF: 23.4 %
Height: 57 in
MV M vel: 4.3 m/s
MV Peak grad: 74 mmHg
S' Lateral: 5.6 cm
Single Plane A2C EF: 26.2 %
Single Plane A4C EF: 22 %
Weight: 2356.28 oz

## 2022-03-08 LAB — CBC WITH DIFFERENTIAL/PLATELET
Abs Immature Granulocytes: 0.02 10*3/uL (ref 0.00–0.07)
Basophils Absolute: 0 10*3/uL (ref 0.0–0.1)
Basophils Relative: 0 %
Eosinophils Absolute: 0 10*3/uL (ref 0.0–0.5)
Eosinophils Relative: 0 %
HCT: 45 % (ref 36.0–46.0)
Hemoglobin: 14.3 g/dL (ref 12.0–15.0)
Immature Granulocytes: 0 %
Lymphocytes Relative: 14 %
Lymphs Abs: 1.1 10*3/uL (ref 0.7–4.0)
MCH: 30.4 pg (ref 26.0–34.0)
MCHC: 31.8 g/dL (ref 30.0–36.0)
MCV: 95.7 fL (ref 80.0–100.0)
Monocytes Absolute: 0.4 10*3/uL (ref 0.1–1.0)
Monocytes Relative: 6 %
Neutro Abs: 6.3 10*3/uL (ref 1.7–7.7)
Neutrophils Relative %: 80 %
Platelets: 100 10*3/uL — ABNORMAL LOW (ref 150–400)
RBC: 4.7 MIL/uL (ref 3.87–5.11)
RDW: 15.1 % (ref 11.5–15.5)
WBC: 7.8 10*3/uL (ref 4.0–10.5)
nRBC: 0 % (ref 0.0–0.2)

## 2022-03-08 LAB — COMPREHENSIVE METABOLIC PANEL
ALT: 40 U/L (ref 0–44)
AST: 33 U/L (ref 15–41)
Albumin: 3.4 g/dL — ABNORMAL LOW (ref 3.5–5.0)
Alkaline Phosphatase: 125 U/L (ref 38–126)
Anion gap: 13 (ref 5–15)
BUN: 25 mg/dL — ABNORMAL HIGH (ref 8–23)
CO2: 23 mmol/L (ref 22–32)
Calcium: 9.9 mg/dL (ref 8.9–10.3)
Chloride: 100 mmol/L (ref 98–111)
Creatinine, Ser: 1.09 mg/dL — ABNORMAL HIGH (ref 0.44–1.00)
GFR, Estimated: 54 mL/min — ABNORMAL LOW (ref >60)
Glucose, Bld: 131 mg/dL — ABNORMAL HIGH (ref 70–99)
Potassium: 3.9 mmol/L (ref 3.5–5.1)
Sodium: 136 mmol/L (ref 135–145)
Total Bilirubin: 1.9 mg/dL — ABNORMAL HIGH (ref 0.3–1.2)
Total Protein: 7.3 g/dL (ref 6.5–8.1)

## 2022-03-08 LAB — RESP PANEL BY RT-PCR (FLU A&B, COVID) ARPGX2
Influenza A by PCR: NEGATIVE
Influenza B by PCR: NEGATIVE
SARS Coronavirus 2 by RT PCR: NEGATIVE

## 2022-03-08 LAB — BRAIN NATRIURETIC PEPTIDE: B Natriuretic Peptide: 1902.5 pg/mL — ABNORMAL HIGH (ref 0.0–100.0)

## 2022-03-08 LAB — TROPONIN I (HIGH SENSITIVITY)
Troponin I (High Sensitivity): 55 ng/L — ABNORMAL HIGH (ref <18)
Troponin I (High Sensitivity): 47 ng/L — ABNORMAL HIGH (ref <18)

## 2022-03-08 LAB — LACTIC ACID, PLASMA: Lactic Acid, Venous: 1.1 mmol/L (ref 0.5–1.9)

## 2022-03-08 MED ORDER — SPIRONOLACTONE 25 MG PO TABS
25.0000 mg | ORAL_TABLET | Freq: Every day | ORAL | Status: DC
Start: 2022-03-08 — End: 2022-03-14
  Administered 2022-03-08 – 2022-03-14 (×7): 25 mg via ORAL
  Filled 2022-03-08 (×7): qty 1

## 2022-03-08 MED ORDER — POTASSIUM CHLORIDE CRYS ER 20 MEQ PO TBCR
40.0000 meq | EXTENDED_RELEASE_TABLET | Freq: Once | ORAL | Status: AC
  Administered 2022-03-08: 40 meq via ORAL
  Filled 2022-03-08: qty 2

## 2022-03-08 MED ORDER — AMIODARONE HCL 100 MG PO TABS
100.0000 mg | ORAL_TABLET | Freq: Every day | ORAL | Status: DC
  Administered 2022-03-08: 100 mg via ORAL
  Filled 2022-03-08: qty 1

## 2022-03-08 MED ORDER — AMIODARONE HCL 200 MG PO TABS
200.0000 mg | ORAL_TABLET | Freq: Every day | ORAL | Status: DC
  Administered 2022-03-09 – 2022-03-10 (×2): 200 mg via ORAL
  Filled 2022-03-08 (×2): qty 1

## 2022-03-08 MED ORDER — ACETAMINOPHEN 325 MG PO TABS: 650.0000 mg | ORAL_TABLET | Freq: Four times a day (QID) | ORAL | Status: DC | PRN

## 2022-03-08 MED ORDER — EDOXABAN TOSYLATE 30 MG PO TABS
30.0000 mg | ORAL_TABLET | Freq: Every day | ORAL | Status: DC
  Administered 2022-03-08 – 2022-03-14 (×7): 30 mg via ORAL
  Filled 2022-03-08 (×7): qty 1

## 2022-03-08 MED ORDER — FUROSEMIDE 10 MG/ML IJ SOLN
80.0000 mg | Freq: Two times a day (BID) | INTRAMUSCULAR | Status: DC
  Administered 2022-03-08 – 2022-03-10 (×5): 80 mg via INTRAVENOUS
  Filled 2022-03-08 (×5): qty 8

## 2022-03-08 MED ORDER — ACETAMINOPHEN 650 MG RE SUPP: 650.0000 mg | Freq: Four times a day (QID) | RECTAL | Status: DC | PRN

## 2022-03-08 MED ORDER — AMIODARONE HCL 100 MG PO TABS
100.0000 mg | ORAL_TABLET | Freq: Once | ORAL | Status: AC
  Administered 2022-03-08: 100 mg via ORAL
  Filled 2022-03-08: qty 1

## 2022-03-08 MED ORDER — PERFLUTREN LIPID MICROSPHERE
1.0000 mL | INTRAVENOUS | Status: AC | PRN
  Administered 2022-03-08: 2 mL via INTRAVENOUS
  Filled 2022-03-08: qty 10

## 2022-03-08 MED ORDER — ORAL CARE MOUTH RINSE
15.0000 mL | Freq: Two times a day (BID) | OROMUCOSAL | Status: DC
  Administered 2022-03-08 – 2022-03-14 (×12): 15 mL via OROMUCOSAL

## 2022-03-08 MED ORDER — PHENOBARBITAL 32.4 MG PO TABS
64.8000 mg | ORAL_TABLET | Freq: Two times a day (BID) | ORAL | Status: DC
  Administered 2022-03-08 – 2022-03-14 (×13): 64.8 mg via ORAL
  Filled 2022-03-08 (×12): qty 2
  Filled 2022-03-08: qty 4

## 2022-03-08 NOTE — ED Notes (Signed)
Pt resting comfortably in bed. Pt assisted with changing TV channel. Pt denies pain at this time. Respirations are even and non-labored  ?

## 2022-03-08 NOTE — ED Provider Notes (Signed)
71 year old female who presented to the emergency department with concern for shortness of breath.  COPD exacerbation with CHF exacerbation findings.  Cardiology contacted me and requested medical admission.  They will consult for recommendations on diuresis in the setting of soft blood pressure.  Patient is currently on 2 L nasal cannula which is her baseline.  Will admit medically to the family medicine service.  Patients evaluation and results requires admission for further treatment and care.  Spoke with family medicine, reviewed patient's ED course and they accept admission.  Patient agrees with admission plan, offers no new complaints and is stable/unchanged at time of admit. ?  Misty Logan, DO ?03/08/22 1253 ? ?

## 2022-03-08 NOTE — TOC Progression Note (Signed)
Transition of Care (TOC) - Progression Note  ? ? ?Patient Details  ?Name: Misty Gray ?MRN: [id number] ?Date of Birth: 07/20/1951 ? ?Transition of Care (TOC) CM/SW Contact  ?Leone Haven, RN ?Phone Number: ?03/08/2022, 4:40 PM ? ?Clinical Narrative:    ? ? ?Transition of Care (TOC) Screening Note ? ? ?Patient Details  ?Name: Misty Gray ?Date of Birth: February 28, 1951 ? ? ?Transition of Care (TOC) CM/SW Contact:    ?Leone Haven, RN ?Phone Number: ?03/08/2022, 4:40 PM ? ? ? ?Transition of Care Department [facility]) has reviewed patient and no TOC needs have been identified at this time. We will continue to monitor patient advancement through interdisciplinary progression rounds. If new patient transition needs arise, please place a TOC consult. ?  ? ?  ?  ? ?Expected Discharge Plan and Services ?  ?  ?  ?  ?  ?                ?  ?  ?  ?  ?  ?  ?  ?  ?  ?  ? ? ?Social Determinants of Health (SDOH) Interventions ?  ? ?Readmission Risk Interventions ?   ? View : No data to display.  ?  ?  ?  ? ? ?

## 2022-03-08 NOTE — H&P (Addendum)
Family Medicine Teaching Service ?Hospital Admission History and Physical ?Service Pager: [phone] ? ?[name] Medical record number: [id number] ?Date of birth: Jul 28, 1951 Age: 71 y.o. Gender: female ? ?Primary Care Provider: Latrelle Dodrill, MD ?Consultants: cardiology  ?Code Status: DNR ?Preferred Emergency Contact: [name], son, [phone] ? ? ?Chief Complaint: shortness of breath ? ? ?Assessment and Plan: ?Misty Gray is a 71 y.o. female presenting with worsening shortness of breath. PMH is significant for heart failure with reduced ejection fraction, A-fib, COPD,OSA, history of seizure, sick sinus syndrome with pacemaker, nonischemic cardiomyopathy ? ?Acute on chronic systolic heart failure exacerbation  ?Non-ischemic cardiomyopathy  ?Patient arrived to the ED from EMS last night.  She had mild hypotension therefore Lasix was not given.  She has missed some of her medications in the setting of her husband's recent death.  Notes that she has been more stressed out and forgot to take some of her medications.  Echo in October 2022 with a EF 20-25% with global left ventricular hypokinesis.  She uses 2 L nasal cannula.  She is on her home oxygen requirement.  Home medications include Aldactone 25 mg, torsemide 80 morning and 16 evening.  ?-Admit to inpatient cardiac telemetry, attending Dr. Deirdre Priest ?-Cardiology consulted, appreciate recommendations ?-Hold diuretics for now: follow-up plan for diuretics with cardiology  ?-Strict I's and O's ?-Daily weights ?-PT/OT eval and treat ?-AM EKG ?-AM CBC and CMP ?-Cardiac monitoring  ? ?Chronic venous stasis dermatitis ?On exam, bilateral lower extremity edema without weeping.  Patient is afebrile with no leukocytosis.  Doubt cellulitis. ?-Diuresis per cardiology ?-Encourage leg elevation ? ? ?AFIB  Sick Sinus syndrome with pacemaker ?Heart rate mostly controlled in the ED. Home medications include amiodarone 100 mg daily, ?and 60 mg edoxaban  daily. ?-Reduced dose of edoxaban given CrCl to 30 mg daily ?- Mg >2, K >4 ? ?Thrombocytopenia ?Chronic.  Platelets on admission 100.  ?-Holding anticoagulation if less than 50 ?-AM CBC ? ?History of seizure and CVA  ?Stable. Has seizure like activity on 3/11 with fall. Home medications include phenobarbital 64.8 mg twice daily. ?- Continue home medications  ? ?OSA ?-CPAP at bedtime ? ?Fall  ?Pt with right sided facial hematoma from fall on 02/23/22.  Sutures were removed in the ED. She is on chronic  anticoagulation for AFIB.  ?- PT / OT eval and treat  ? ?Mood disturbance  Acute Grief Reaction  ?Reports taking  Prozac daily.  Pharmacy patient is not filling this medication regularly. Her husband recently died. Pt was crying during admission interview.  ?-Inquire if patient would like to restart this medication ?-consider TOC consult for grief  ? ?FEN/GI: Heart healthy, replete electrolytes as needed ?Prophylaxis: Edoxaban ? ?Disposition: Admit for cardiac telemetry ? ?History of Present Illness:  Misty Gray is a 71 y.o. female presenting with worsening shortness of breath.  ? ?Patient brought in by EMS for evaluation of worsening shortness of breath.  Patient reports shortness of breath for the past 2 days with increased swelling of her legs.  Endorses pressure-like chest pain EMS gave her Solu-Medrol 125 and albuterol prior to arrival.  States she has been more stress recently as her husband died this week. ? ?In the ED, cardiology was consulted for admission however today cardiology team requested medical admission.  She was treated for COPD exacerbation. BNP 1900 with flat troponin.  Lasix has been given.   ? ?Review Of Systems: Per HPI with the following additions:  ? ?  Review of Systems  ?Constitutional:  Negative for fever.  ?HENT:  Negative for congestion.   ?Respiratory:  Positive for shortness of breath.   ?Cardiovascular:  Positive for leg swelling.  ?Gastrointestinal:  Negative for vomiting.   ?Musculoskeletal:  Positive for falls.  ?Skin:   ?     bruising  ?Neurological:  Positive for weakness.  ?Psychiatric/Behavioral:  Positive for depression.   ?All other systems reviewed and are negative. ? ?Patient Active Problem List  ? Diagnosis Date Noted  ? Acute CHF (congestive heart failure) (HCC) 03/08/2022  ? Thrombocytopenia (HCC) 10/16/2021  ? Heart failure (HCC) 10/03/2021  ? Stasis dermatitis of both legs 08/05/2021  ? Hand injury, left, initial encounter 09/22/2020  ? CHF exacerbation (HCC) 07/12/2020  ? Hospital discharge follow-up 11/17/2019  ? Onychomycosis 11/17/2019  ? Abdominal wall hernia 11/05/2017  ? Anticoagulated 11/05/2017  ? Rash and nonspecific skin eruption 09/15/2017  ? Ventral hernia without obstruction or gangrene 08/15/2017  ? Chronic venous stasis dermatitis 12/26/2016  ? Urinary tract infection without hematuria   ? Frequent PVCs   ? Hypokalemia   ? Housing problems 11/23/2016  ? Chronic atrial fibrillation (HCC) 11/01/2016  ? Hypotension 10/31/2016  ? Healthcare maintenance 08/12/2016  ? Heme positive stool 08/09/2016  ? Intertrigo 08/09/2016  ? Chronic anticoagulation 04/26/2016  ? Chronic venous insufficiency   ? Cellulitis 09/07/2015  ? Breast mass, left 10/05/2014  ? Osteopenia 09/15/2014  ? Hypertension 01/04/2014  ? Chronic systolic dysfunction of left ventricle 05/05/2013  ? At high risk for falls 02/11/2013  ? Vitamin D deficiency 07/15/2012  ? Sinoatrial node dysfunction (HCC) 05/08/2011  ? Pacemaker 08/10/2009  ? Allergic rhinitis 05/31/2008  ? Morbid obesity (HCC) 02/12/2007  ? GASTROESOPHAGEAL REFLUX, NO ESOPHAGITIS 02/12/2007  ? Female stress incontinence 02/12/2007  ? Seizure disorder (HCC) 02/12/2007  ? OSA (obstructive sleep apnea) 02/12/2007  ? ? ?Past Medical History: ?Past Medical History:  ?Diagnosis Date  ? Abnormal CT of the head 12/16/1984  ? R parietal atrophy  ? Allergic rhinitis, cause unspecified   ? Altered mental status 11/28/2016  ? Anemia   ? Arthritis    ? "hands and knees" (09/06/2015)  ? Atrial fibrillation (HCC)   ? Cellulitis 09/07/2015  ? Cellulitis and abscess of leg 09/2016  ? bilateral  ? CHF (congestive heart failure) (HCC)   ? Diaphragmatic hernia without mention of obstruction or gangrene   ? Dyspnea   ? Exertional dyspnea 06/22/12  ? Family history of adverse reaction to anesthesia   ? "daughter fights w/them; just can't relax during OR; stay awake during the OR"  ? Fatigue 06/22/2012  ? Female stress incontinence   ? GERD (gastroesophageal reflux disease)   ? Grand mal seizure [facility])   ? H/O hiatal hernia   ? two removed  ? Heart murmur   ? High cholesterol   ? History of blood transfusion 1956  ? "related to nose bleed"  ? Hypertension   ? Influenza A 01/11/2014  ? Lichenification and lichen simplex chronicus   ? Migraine   ? "when I was a teenager"  ? Morbid obesity (HCC)   ? Nonischemic cardiomyopathy (HCC)   ? EF has normalized-repeat Pending   ? On home oxygen therapy   ? "2L when I'm asleep in bed" (09/06/2015)  ? OSA on CPAP   ? Pacemaker  st judes   ? Patient states "this is my third pacemaker"  ? Pneumonia 2004; 2011; 11/2011  ? Seizures (HCC) since  1981  ? "can hear you talking but sounds like you are in big tunnel; have them often if not taking RX; last one was 06/17/12" (09/06/2015)  ? Shortness of breath 10/24/2010  ? Qualifier: Diagnosis of  By: [place], Bonnie    ? Sick sinus syndrome (HCC)   ? WITH PRIOR DDD PACEMAKER IMPLANTATION  ? Skin irritation 07/23/2017  ? Somnolence   ? Stroke [facility]) 1988  ? "mouth drawed real bad on my left side; found out it was a seizure"  ? Syncope and collapse 06/22/12  ? "hit forehead and left knee"; denies loss of consciousness  ? Unspecified venous (peripheral) insufficiency   ? ? ?Past Surgical History: ?Past Surgical History:  ?Procedure Laterality Date  ? APPENDECTOMY    ? CARDIAC CATHETERIZATION N/A 10/03/2016  ? Procedure: Right/Left Heart Cath and Coronary Angiography;  Surgeon: Dolores Patty, MD;  Location: [facility]  INVASIVE CV LAB;  Service: Cardiovascular;  Laterality: N/A;  ? HERNIA REPAIR  ? 1988; 04/15/1998  ? INSERT / REPLACE / REMOVE PACEMAKER  12/16/1990  ? DDD EF 30%;  ? INSERT / REPLACE / REMOVE PACEMAKER  07/25/2003

## 2022-03-08 NOTE — ED Provider Notes (Signed)
?[place] ?Provider Note ? ? ?CSN: [id number] ?Arrival date & time: 03/07/22  2346 ? ?  ? ?History ? ?Chief Complaint  ?Patient presents with  ? Shortness of Breath  ? Chest Pain  ? ? ?Misty Gray is a 71 y.o. female. ? ?Patient presents to the emergency department for evaluation of chest pain and shortness of breath.  Symptoms ongoing for 2 days.  Pain is in the center of her chest, constant.  Patient reports a history of COPD and CHF.  She has had increased swelling.  Patient comes to the ER by ambulance, having received Solu-Medrol and albuterol. ? ? ?  ? ?Home Medications ?Prior to Admission medications   ?Medication Sig Start Date End Date Taking? Authorizing Provider  ?amiodarone (PACERONE) 200 MG tablet Take 0.5 tablets (100 mg total) by mouth daily. 01/14/22   [name], Shaune Pascal, MD  ?D3-1000 25 MCG (1000 UT) tablet TAKE 1 TABLET BY MOUTH DAILY (AM) 01/17/22   Zenia Resides, MD  ?fluticasone (FLONASE) 50 MCG/ACT nasal spray Place 2 sprays into both nostrils daily as needed. For congestion. 10/16/21   [name], MD  ?omeprazole (PRILOSEC) 20 MG capsule TAKE ONE CAPSULE BY MOUTH EVERY MORNING 01/17/22   Zenia Resides, MD  ?PHENobarbital (LUMINAL) 64.8 MG tablet TAKE ONE TABLET BY MOUTH TWICE DAILY (AM+BEDTIME) 01/17/22   Hensel, Jamal Collin, MD  ?potassium chloride (KLOR-CON) 10 MEQ tablet Take 40 mEq by mouth 3 (three) times daily. 3 tablets @HS     [provider]  ?SAVAYSA 60 MG TABS tablet TAKE ONE TABLET BY MOUTH ONCE DAILY (AM) 01/17/22   Milford, Maricela Bo, FNP  ?spironolactone (ALDACTONE) 25 MG tablet TAKE 1 TABLET (25 MG TOTAL) BY MOUTH DAILY (AM) 01/14/22   [name], Shaune Pascal, MD  ?torsemide (DEMADEX) 20 MG tablet TAKE 4 TABLETS BY MOUTH EVERY MORNING AND 3 TABLETS EVERY EVENING 09/26/21   [name], Shaune Pascal, MD  ?   ? ?Allergies    ?Aspirin, Penicillins, Tape, Wool alcohol [lanolin], Amoxicillin, and Ampicillin   ? ?Review of Systems    ?Review of Systems  ?Respiratory:  Positive for shortness of breath.   ?Cardiovascular:  Positive for chest pain and leg swelling.  ? ?Physical Exam ?Updated Vital Signs ?BP 95/61   Pulse 70   Temp (!) 97.4 ?F (36.3 ?C) (Oral)   Resp 17   Ht 4\' 9"  (1.448 m)   Wt 66.8 kg   SpO2 98%   BMI 31.87 kg/m?  ?Physical Exam ?Vitals and nursing note reviewed.  ?Constitutional:   ?   General: She is not in acute distress. ?   Appearance: She is well-developed.  ?HENT:  ?   Head: Normocephalic and atraumatic.  ?   Mouth/Throat:  ?   Mouth: Mucous membranes are moist.  ?Eyes:  ?   General: Vision grossly intact. Gaze aligned appropriately.  ?   Extraocular Movements: Extraocular movements intact.  ?   Conjunctiva/sclera: Conjunctivae normal.  ?Cardiovascular:  ?   Rate and Rhythm: Normal rate and regular rhythm.  ?   Pulses: Normal pulses.  ?   Heart sounds: Normal heart sounds, S1 normal and S2 normal. No murmur heard. ?  No friction rub. No gallop.  ?Pulmonary:  ?   Effort: Pulmonary effort is normal. No respiratory distress.  ?   Breath sounds: Normal breath sounds.  ?Abdominal:  ?   General: Bowel sounds are normal.  ?   Palpations: Abdomen is  soft.  ?   Tenderness: There is no abdominal tenderness. There is no guarding or rebound.  ?   Hernia: No hernia is present.  ?Musculoskeletal:     ?   General: No swelling.  ?   Cervical back: Full passive range of motion without pain, normal range of motion and neck supple. No spinous process tenderness or muscular tenderness. Normal range of motion.  ?   Right lower leg: Edema present.  ?   Left lower leg: Edema present.  ?Skin: ?   General: Skin is warm and dry.  ?   Capillary Refill: Capillary refill takes less than 2 seconds.  ?   Findings: No ecchymosis, erythema, rash or wound.  ?Neurological:  ?   General: No focal deficit present.  ?   Mental Status: She is alert and oriented to person, place, and time.  ?   GCS: GCS eye subscore is 4. GCS verbal subscore is 5. GCS  motor subscore is 6.  ?   Cranial Nerves: Cranial nerves 2-12 are intact.  ?   Sensory: Sensation is intact.  ?   Motor: Motor function is intact.  ?   Coordination: Coordination is intact.  ?Psychiatric:     ?   Attention and Perception: Attention normal.     ?   Mood and Affect: Mood normal.     ?   Speech: Speech normal.     ?   Behavior: Behavior normal.  ? ? ?ED Results / Procedures / Treatments   ?Labs ?(all labs ordered are listed, but only abnormal results are displayed) ?Labs Reviewed  ?CBC WITH DIFFERENTIAL/PLATELET - Abnormal; Notable for the following components:  ?    Result Value  ? Platelets 100 (*)   ? All other components within normal limits  ?COMPREHENSIVE METABOLIC PANEL - Abnormal; Notable for the following components:  ? Glucose, Bld 131 (*)   ? BUN 25 (*)   ? Creatinine, Ser 1.09 (*)   ? Albumin 3.4 (*)   ? Total Bilirubin 1.9 (*)   ? GFR, Estimated 54 (*)   ? All other components within normal limits  ?BRAIN NATRIURETIC PEPTIDE - Abnormal; Notable for the following components:  ? B Natriuretic Peptide 1,902.5 (*)   ? All other components within normal limits  ?TROPONIN I (HIGH SENSITIVITY) - Abnormal; Notable for the following components:  ? Troponin I (High Sensitivity) 55 (*)   ? All other components within normal limits  ?RESP PANEL BY RT-PCR (FLU A&B, COVID) [id number]  ?LACTIC ACID, PLASMA  ?TROPONIN I (HIGH SENSITIVITY)  ? ? ?EKG ?EKG Interpretation ? ?Date/Time:  Thursday March 07 2022 23:56:03 EDT ?Ventricular Rate:  95 ?PR Interval:    ?QRS Duration: 118 ?QT Interval:  350 ?QTC Calculation: 461 ?R Axis:   -38 ?Text Interpretation: Atrial fibrillation Paired ventricular premature complexes Nonspecific IVCD with LAD Left ventricular hypertrophy Inferior infarct, old Anterior Q waves, possibly due to LVH Lateral leads are also involved Confirmed by Orpah Greek [phone]) on 03/08/2022 4:39:22 AM ? ?Radiology ?DG Chest Port 1 View ? ?Result Date: 03/08/2022 ?CLINICAL DATA:  Shortness  of breath. EXAM: PORTABLE CHEST 1 VIEW COMPARISON:  AP Lat 10/02/2021 FINDINGS: Right chest dual lead pacing system and wire insertions are unaltered. Moderate severe cardiomegaly again noted. There is perihilar vascular congestion and moderate perihilar edema extending into the mid to lower lung fields. Interval worsening noted with moderate left and small right pleural effusions with dense consolidation or atelectasis in  the left lower lobe, hazy atelectasis or consolidation in the right base, and perihilar haziness which is probably ground-glass edema. Stable mediastinum with aortic atherosclerosis. There is thoracic spondylosis and bridging enthesopathy. IMPRESSION: Moderate features of CHF with left-greater-than-right pleural effusions and overlying lung opacities. Electronically Signed   By: Telford Nab M.D.   On: 03/08/2022 04:25   ? ?Procedures ?Procedures  ? ? ?Medications Ordered in ED ?Medications - No data to display ? ?ED Course/ Medical Decision Making/ A&P ?  ?                        ?Medical Decision Making ?Amount and/or Complexity of Data Reviewed ?External Data Reviewed: labs, radiology, ECG and notes. ?   Details: prior cardiology notes reviewed ?Labs: ordered. ?Radiology: ordered. ? ?Risk ?Decision regarding hospitalization. ? ? ?Patient presents to the emerged department for evaluation of chest pain and shortness of breath.  Patient has a history of nonischemic cardiomyopathy with ejection fraction of 20 to 25%.  She has chronic atrial fibrillation with history of tachybradycardia syndrome, status post pacemaker. ? ?Differential diagnosis considered includes acute coronary syndrome including NSTEMI, unstable angina, congestive heart failure exacerbation. ? ?Patient brought to the emergency department by ambulance from home with shortness of breath.  She was given albuterol and Solu-Medrol during transport, no bronchospasm noted at arrival.  Patient is mildly dyspneic but not significantly  hypoxic.  Records reviewed indicate that she has a history of significant systolic heart failure secondary to nonischemic cardiomyopathy. ? ?Patient noted to progressively become more hypotensive.  It appears that the p

## 2022-03-08 NOTE — Progress Notes (Incomplete)
Pt admitted from ED to the unit via stretcher with belongings to the side and on 2 L oxygen. Pt ambulated to the BR to void x1 with assistance. Pt oriented to the unit and room, telemetry applied and verified with CCMD and NT called to second verify. Skin assessment completed with intact with no opened ?

## 2022-03-08 NOTE — ED Notes (Signed)
Pt assisted to bed side commode to urinate. Pt able to support her own weight. No increased work of breathing or shortness of breath with movement  ? ?

## 2022-03-08 NOTE — ED Notes (Signed)
Pt is A-fib on monitor 

## 2022-03-08 NOTE — ED Notes (Signed)
Pt's right eye is ecchymotic. ?

## 2022-03-08 NOTE — Progress Notes (Signed)
FPTS Brief Progress Note ? ?S: Spoke to nighttime nurse regarding how the patient is doing.  She reports that she is doing well and has no concerns at this time.  No needs at this time.  She is sleeping when I go to evaluate and I did not disturb ? ? ?O: ?BP 106/65 (BP Location: Left Arm)   Pulse 89   Temp 98.4 ?F (36.9 ?C) (Oral)   Resp 19   Ht 4\' 9"  (1.448 m)   Wt 65.1 kg   SpO2 100%   BMI 31.06 kg/m?   ?General: 71 year old female sleeping comfortably ? ?A/P: ?Continue plan per day team ?- Orders reviewed. Labs for AM ordered, which was adjusted as needed.  ? ? ?61, MD ?03/08/2022, 9:24 PM ?PGY-3,  Family Medicine Night Resident  ?Please page [phone] with questions.  ? ? ?

## 2022-03-08 NOTE — Consult Note (Addendum)
?  ?Advanced Heart Failure Team Consult Note ? ? ?Primary Physician: [name], MD ?HF cardiologist: Dr. Haroldine Laws ? ?Reason for Consultation: Acute on chronic systolic CHF ? ?HPI:   ? ?Misty Gray is seen today for evaluation of acute on chronic systolic CHF at the request of Dr. Betsey Holiday with Emergency Medicine.  ? ?71 y/o woman with morbid obesity, OSA, chronic atrial fibrillation with tachy-brady syndrome s/p PPM, frequent PVCs, systolic HF due to NICM. Has declined ICD placement. ?  ?Admitted for ADHF in 10/17. Cath with normal coronary arteries and elevated filling pressures. Echo  EF 30-35% (previously 35-40%). Felt to have possible PVC or RV pacing cardiomyopathy. (She had 81% RV pacing and 15-20 PVCs per minute). Started on po amio with suppression of PVCs. Diuresed 41 pounds. Discharge weight 197.  ?  ?Echo 8/18 LVEF 25-30%, Trivial AI, Mod MR, Severe LAE, Mild RV dilation, Mild RAE, PA peak pressure 31 mm Hg. ?  ?Echo 1/20 EF 25-30% (no improvement with PVC suppression) ?  ?Admitted [id number] with A/C systolic heart failure. Diuresed 21 pounds. RV [pacing 28% Decreased backup pacing rate to 50 bpm from 70 bpm.  ?  ?Has been followed by Dr. Rayann Heman. At time of PM gen change < 20% RV bacing and QRS < 130 so felt not to be candidate for CRT upgrade. Patient did not want ICD.  ?  ?Admitted 10/22 with ADHF. Echo EF 20-25%. RV moderately HK. Provided home O2 at discharge. ?  ?Last seen in clinic for follow-up 02/23. Volume appeared stable.  ? ?Seen in ED 03/11 with seizure activity and facial laceration. CT head no acute process, old right MCA infarct, evidence of bilateral pleural effusions with diffuse interstitial prominence suspicious for interstitial edema. No recurrent seizure activity in ED. Laceration repaired and felt to be stable for discharge home. ? ?Patient arrived to ED late last night via EMS for increasing dyspnea and CP X 2 days. States it felt like an elephant was sitting on her chest  last night. Denies orthopnea or PND. Notes leg edema. Reports her husband passed suddenly a week ago. Has been stressed out preparing for his funeral and admits she has missed a few days of medications. Given albuterol and solumedrol en route. ? ?Labs significant for BNP 1,902, HS troponin 55> 47, Scr 1.09, CO2 23, Hgb 14.3, WBC 7.8, lactic acid 1.1. CXR with evidence of CHF including moderate left and small right pleural effusions and overlying lung opacities. ? ?ECG with atrial fibrillation rate 95, PVCs ? ?SBP down to 80s, averaging 90s - 100s this am.  ? ?Currently CP free. Resting comfortably in bed. O2 stable on 2L.  ? ?Lives at home with her son. Reports she is independent in ADLs. Enrolled in paramedicine in the community.  ? ?Had attempted to get home nursing care in past, but agencies refused to come out to home d/t living conditions including roaches. ? ?Review of Systems: [y] = yes, [ ]  = no  ? ?General: Weight gain [ ] ; Weight loss [ ] ; Anorexia [ ] ; Fatigue [Y]; Fever [ ] ; Chills [ ] ; Weakness [ ]   ?Cardiac: Chest pain/pressure [Y ]; Resting SOB [Y]; Exertional SOB [Y]; Orthopnea [ ] ; Pedal Edema [ ] ; Palpitations [ ] ; Syncope [ ] ; Presyncope [ ] ; Paroxysmal nocturnal dyspnea[ ]   ?Pulmonary: Cough [ ] ; Wheezing[ ] ; Hemoptysis[ ] ; Sputum [ ] ; Snoring [ ]   ?GI: Vomiting[ ] ; Dysphagia[ ] ; Melena[ ] ; Hematochezia [ ] ; Heartburn[ ] ; Abdominal pain [ ] ;  Constipation [ ] ; Diarrhea [ ] ; BRBPR [ ]   ?GU: Hematuria[ ] ; Dysuria [ ] ; Nocturia[ ]   ?Vascular: Pain in legs with walking [ ] ; Pain in feet with lying flat [ ] ; Non-healing sores [ ] ; Stroke [ ] ; TIA [ ] ; Slurred speech [ ] ;  ?Neuro: Headaches[ ] ; Vertigo[ ] ; Seizures[Y]; Paresthesias[ ] ;Blurred vision [ ] ; Diplopia [ ] ; Vision changes [ ]   ?Ortho/Skin: Arthritis [ ] ; Joint pain [ ] ; Muscle pain [ ] ; Joint swelling [ ] ; Back Pain [ ] ; Rash [ ]   ?Psych: Depression[ ] ; Anxiety[ ]   ?Heme: Bleeding problems [ ] ; Clotting disorders [ ] ; Anemia [ ]   ?Endocrine:  Diabetes [ ] ; Thyroid dysfunction[ ]  ? ?Home Medications ?Prior to Admission medications   ?Medication Sig Start Date End Date Taking? Authorizing Provider  ?amiodarone (PACERONE) 200 MG tablet Take 0.5 tablets (100 mg total) by mouth daily. 01/14/22  Yes [name], Shaune Pascal, MD  ?D3-1000 25 MCG (1000 UT) tablet TAKE 1 TABLET BY MOUTH DAILY (AM) ?Patient taking differently: Take 1,000 Units by mouth daily. 01/17/22  Yes Hensel, Jamal Collin, MD  ?FLUoxetine (PROZAC) 10 MG tablet Take 10 mg by mouth daily.   Yes [provider]  ?fluticasone (FLONASE) 50 MCG/ACT nasal spray Place 2 sprays into both nostrils daily as needed. For congestion. ?Patient taking differently: Place 2 sprays into both nostrils daily as needed (congestion). 10/16/21  Yes [name], MD  ?PHENobarbital (LUMINAL) 64.8 MG tablet TAKE ONE TABLET BY MOUTH TWICE DAILY (AM+BEDTIME) ?Patient taking differently: Take 64.8 mg by mouth 2 (two) times daily. 01/17/22  Yes Hensel, Jamal Collin, MD  ?potassium chloride (KLOR-CON) 10 MEQ tablet Take 20-60 mEq by mouth See admin instructions. 40 mEq 3 times daily, 20 mEq at bedtime   Yes [provider]  ?SAVAYSA 60 MG TABS tablet TAKE ONE TABLET BY MOUTH ONCE DAILY (AM) ?Patient taking differently: Take 60 mg by mouth daily. 01/17/22  Yes Milford, Maricela Bo, FNP  ?spironolactone (ALDACTONE) 25 MG tablet TAKE 1 TABLET (25 MG TOTAL) BY MOUTH DAILY (AM) ?Patient taking differently: Take 25 mg by mouth once. 01/14/22  Yes [name], Shaune Pascal, MD  ?torsemide (DEMADEX) 20 MG tablet TAKE 4 TABLETS BY MOUTH EVERY MORNING AND 3 TABLETS EVERY EVENING ?Patient taking differently: Take 60-80 mg by mouth See admin instructions. 80mg  every morning, 60mg  every evening 09/26/21  Yes [name], Shaune Pascal, MD  ?omeprazole (PRILOSEC) 20 MG capsule TAKE ONE CAPSULE BY MOUTH EVERY MORNING ?Patient not taking: Reported on 03/08/2022 01/17/22   Zenia Resides, MD  ? ? ?Past Medical History: ?Past Medical History:   ?Diagnosis Date  ? Abnormal CT of the head 12/16/1984  ? R parietal atrophy  ? Allergic rhinitis, cause unspecified   ? Altered mental status 11/28/2016  ? Anemia   ? Arthritis   ? "hands and knees" (09/06/2015)  ? Atrial fibrillation ([place])   ? Cellulitis 09/07/2015  ? Cellulitis and abscess of leg 09/2016  ? bilateral  ? CHF (congestive heart failure) ([place])   ? Diaphragmatic hernia without mention of obstruction or gangrene   ? Dyspnea   ? Exertional dyspnea 06/22/12  ? Family history of adverse reaction to anesthesia   ? "daughter fights w/them; just can't relax during OR; stay awake during the OR"  ? Fatigue 06/22/2012  ? Female stress incontinence   ? GERD (gastroesophageal reflux disease)   ? Grand mal seizure [facility])   ? H/O hiatal hernia   ? two removed  ?  Heart murmur   ? High cholesterol   ? History of blood transfusion 1956  ? "related to nose bleed"  ? Hypertension   ? Influenza A 01/11/2014  ? Lichenification and lichen simplex chronicus   ? Migraine   ? "when I was a teenager"  ? Morbid obesity ([place])   ? Nonischemic cardiomyopathy ([place])   ? EF has normalized-repeat Pending   ? On home oxygen therapy   ? "2L when I'm asleep in bed" (09/06/2015)  ? OSA on CPAP   ? Pacemaker  st judes   ? Patient states "this is my third pacemaker"  ? Pneumonia 2004; 2011; 11/2011  ? Seizures ([place]) since 1981  ? "can hear you talking but sounds like you are in big tunnel; have them often if not taking RX; last one was 06/17/12" (09/06/2015)  ? Shortness of breath 10/24/2010  ? Qualifier: Diagnosis of  By: Martinique, Bonnie    ? Sick sinus syndrome ([place])   ? WITH PRIOR DDD PACEMAKER IMPLANTATION  ? Skin irritation 07/23/2017  ? Somnolence   ? Stroke [facility]) 1988  ? "mouth drawed real bad on my left side; found out it was a seizure"  ? Syncope and collapse 06/22/12  ? "hit forehead and left knee"; denies loss of consciousness  ? Unspecified venous (peripheral) insufficiency   ? ? ?Past Surgical History: ?Past Surgical History:  ?Procedure  Laterality Date  ? APPENDECTOMY    ? CARDIAC CATHETERIZATION N/A 10/03/2016  ? Procedure: Right/Left Heart Cath and Coronary Angiography;  Surgeon: Jolaine Artist, MD;  Location: [facility];  Service: C

## 2022-03-09 ENCOUNTER — Inpatient Hospital Stay (HOSPITAL_COMMUNITY): Payer: Medicare HMO

## 2022-03-09 DIAGNOSIS — I5043 Acute on chronic combined systolic (congestive) and diastolic (congestive) heart failure: Secondary | ICD-10-CM

## 2022-03-09 DIAGNOSIS — I5031 Acute diastolic (congestive) heart failure: Secondary | ICD-10-CM | POA: Diagnosis not present

## 2022-03-09 DIAGNOSIS — I4821 Permanent atrial fibrillation: Secondary | ICD-10-CM

## 2022-03-09 LAB — CBC
HCT: 39.1 % (ref 36.0–46.0)
Hemoglobin: 12.6 g/dL (ref 12.0–15.0)
MCH: 30.5 pg (ref 26.0–34.0)
MCHC: 32.2 g/dL (ref 30.0–36.0)
MCV: 94.7 fL (ref 80.0–100.0)
Platelets: 74 10*3/uL — ABNORMAL LOW (ref 150–400)
RBC: 4.13 MIL/uL (ref 3.87–5.11)
RDW: 14.7 % (ref 11.5–15.5)
WBC: 5.8 10*3/uL (ref 4.0–10.5)
nRBC: 0 % (ref 0.0–0.2)

## 2022-03-09 LAB — COMPREHENSIVE METABOLIC PANEL
ALT: 27 U/L (ref 0–44)
AST: 20 U/L (ref 15–41)
Albumin: 2.6 g/dL — ABNORMAL LOW (ref 3.5–5.0)
Alkaline Phosphatase: 100 U/L (ref 38–126)
Anion gap: 6 (ref 5–15)
BUN: 29 mg/dL — ABNORMAL HIGH (ref 8–23)
CO2: 29 mmol/L (ref 22–32)
Calcium: 9.3 mg/dL (ref 8.9–10.3)
Chloride: 103 mmol/L (ref 98–111)
Creatinine, Ser: 0.95 mg/dL (ref 0.44–1.00)
GFR, Estimated: >60 mL/min (ref >60)
Glucose, Bld: 105 mg/dL — ABNORMAL HIGH (ref 70–99)
Potassium: 3.7 mmol/L (ref 3.5–5.1)
Sodium: 138 mmol/L (ref 135–145)
Total Bilirubin: 1.2 mg/dL (ref 0.3–1.2)
Total Protein: 6 g/dL — ABNORMAL LOW (ref 6.5–8.1)

## 2022-03-09 LAB — MAGNESIUM: Magnesium: 1.7 mg/dL (ref 1.7–2.4)

## 2022-03-09 MED ORDER — METOLAZONE 5 MG PO TABS
5.0000 mg | ORAL_TABLET | Freq: Every day | ORAL | Status: DC
  Administered 2022-03-09: 5 mg via ORAL
  Filled 2022-03-09: qty 1

## 2022-03-09 MED ORDER — FLUOXETINE HCL 20 MG PO TABS
10.0000 mg | ORAL_TABLET | Freq: Every day | ORAL | Status: DC
  Filled 2022-03-09: qty 1

## 2022-03-09 MED ORDER — FLUOXETINE HCL 10 MG PO CAPS
10.0000 mg | ORAL_CAPSULE | Freq: Every day | ORAL | Status: DC
  Administered 2022-03-09 – 2022-03-13 (×5): 10 mg via ORAL
  Filled 2022-03-09 (×6): qty 1

## 2022-03-09 MED ORDER — MAGNESIUM SULFATE 2 GM/50ML IV SOLN
2.0000 g | Freq: Once | INTRAVENOUS | Status: AC
  Administered 2022-03-09: 2 g via INTRAVENOUS
  Filled 2022-03-09: qty 50

## 2022-03-09 NOTE — Evaluation (Signed)
Occupational Therapy Evaluation ?Patient Details ?Name: Misty Gray ?MRN: [id number] ?DOB: 03-Aug-1951 ?Today's Date: 03/09/2022 ? ? ?History of Present Illness 71 yo female admitted 3/23, presents with acute on chronic HF.  [id number] afib, s/p PPM, systolic HF (NICM)  ? ?Clinical Impression ?  ?Pt reported feeling very tired at the start of session but agreed to try. Pt required peri care with set up in bed level, set up for UE dressing, bridging with mod assist for LE dressing.  Pt limited due to fatigue at this time and was only able to remain in chair following Physical Therapy for a short period of time. Pt currently with functional limitations due to the deficits listed below (see OT Problem List).  Pt will benefit from skilled OT to increase their safety and independence with ADL and functional mobility for ADL to facilitate discharge to venue listed below.  ?  ?   ? ?Recommendations for follow up therapy are one component of a multi-disciplinary discharge planning process, led by the attending physician.  Recommendations may be updated based on patient status, additional functional criteria and insurance authorization.  ? ?Follow Up Recommendations ? Skilled nursing-short term rehab (<3 hours/day)  ?  ?Assistance Recommended at Discharge Frequent or constant Supervision/Assistance  ?Patient can return home with the following A lot of help with walking and/or transfers;A lot of help with bathing/dressing/bathroom;Assistance with cooking/housework;Assist for transportation ? ?  ?Functional Status Assessment ? Patient has had a recent decline in their functional status and demonstrates the ability to make significant improvements in function in a reasonable and predictable amount of time.  ?Equipment Recommendations ?    ?  ?Recommendations for Other Services   ? ? ?  ?Precautions / Restrictions Precautions ?Precautions: Fall ?Restrictions ?Weight Bearing Restrictions: No  ? ?  ? ?Mobility Bed Mobility ?Overal  bed mobility: Needs Assistance ?Bed Mobility: Rolling ?Rolling: Min guard ?  ?  ?  ?  ?  ?  ? ?Transfers ?  ?  ?  ?  ?  ?  ?  ?  ?  ?General transfer comment: Pt at this tiem reported feeling very tired following peri care ?  ? ?  ?Balance   ?  ?  ?  ?  ?  ?  ?  ?  ?  ?  ?  ?  ?  ?  ?  ?  ?  ?  ?   ? ?ADL either performed or assessed with clinical judgement  ? ?ADL Overall ADL's : Needs assistance/impaired ?Eating/Feeding: Set up;Sitting ?  ?Grooming: Wash/dry hands;Wash/dry face;Set up;Sitting ?  ?Upper Body Bathing: Set up;Sitting ?  ?Lower Body Bathing: Moderate assistance;Bed level ?  ?Upper Body Dressing : Set up;Sitting ?  ?Lower Body Dressing: Moderate assistance;Bed level ?  ?  ?  ?  ?  ?  ?  ?  ?General ADL Comments: Pt reported feeling very weak now and all the energy is out of them  ? ? ? ?Vision   ?   ?   ?Perception   ?  ?Praxis   ?  ? ?Pertinent Vitals/Pain Pain Assessment ?Pain Assessment: No/denies pain  ? ? ? ?Hand Dominance Right ?  ?Extremity/Trunk Assessment Upper Extremity Assessment ?Upper Extremity Assessment: Generalized weakness ?  ?Lower Extremity Assessment ?Lower Extremity Assessment: Defer to PT evaluation ?  ?Cervical / Trunk Assessment ?Cervical / Trunk Assessment: Kyphotic ?  ?Communication Communication ?Communication: Expressive difficulties ?  ?Cognition Arousal/Alertness: Awake/alert ?Behavior During Therapy: [facility] for  tasks assessed/performed ?Overall Cognitive Status: Within Functional Limits for tasks assessed ?  ?  ?  ?  ?  ?  ?  ?  ?  ?  ?  ?  ?  ?  ?  ?  ?  ?  ?  ?General Comments  [phone] with bed level peri care ? ?  ?Exercises   ?  ?Shoulder Instructions    ? ? ?Home Living Family/patient expects to be discharged to:: Private residence ?Living Arrangements: Children ?Available Help at Discharge: Family;Available 24 hours/day ?Type of Home: House ?Home Access: Stairs to enter ?Entrance Stairs-Number of Steps: 3 ?Entrance Stairs-Rails: Right ?Home Layout: One level ?  ?   ?Bathroom Shower/Tub: Tub/shower unit;Sponge bathes at baseline ?  ?Bathroom Toilet: Standard ?Bathroom Accessibility: No ?  ?Home Equipment: Cane - single Librarian, academic (2 wheels);Rollator (4 wheels);BSC/3in1 ?  ?  ?  ? ?  ?Prior Functioning/Environment   ?  ?  ?  ?  ?  ?  ?Mobility Comments: did not use device PTA, 1 recent fall ?  ?  ? ?  ?  ?OT Problem List: Decreased strength;Decreased activity tolerance;Impaired balance (sitting and/or standing);Decreased safety awareness;Decreased knowledge of use of DME or AE;Pain ?  ?   ?OT Treatment/Interventions: Self-care/ADL training;Therapeutic exercise;DME and/or AE instruction;Therapeutic activities;Patient/family education;Balance training  ?  ?OT Goals(Current goals can be found in the care plan section) Acute Rehab OT Goals ?Patient Stated Goal: to get stronger ?OT Goal Formulation: With patient ?Time For Goal Achievement: 03/23/22 ?Potential to Achieve Goals: Good ?ADL Goals ?Pt Will Perform Grooming: Independently ?Pt Will Perform Upper Body Bathing: with modified independence ?Pt Will Perform Lower Body Bathing: with set-up ?Pt Will Transfer to Toilet: with modified independence;ambulating  ?OT Frequency: Min 2X/week ?  ? ?Co-evaluation   ?  ?  ?  ?  ? ?  ?AM-PAC OT "6 Clicks" Daily Activity     ?Outcome Measure Help from another person eating meals?: None ?Help from another person taking care of personal grooming?: A Little ?Help from another person toileting, which includes using toliet, bedpan, or urinal?: A Lot ?Help from another person bathing (including washing, rinsing, drying)?: A Lot ?Help from another person to put on and taking off regular upper body clothing?: A Little ?Help from another person to put on and taking off regular lower body clothing?: A Lot ?6 Click Score: 16 ?  ?End of Session   ? ?Activity Tolerance: Patient limited by fatigue ?Patient left: in bed;with call bell/phone within reach;with bed alarm set ? ?OT Visit Diagnosis:  Unsteadiness on feet (R26.81);Muscle weakness (generalized) (M62.81)  ?              ?Time: 1130-1202 ?OT Time Calculation (min): 32 min ?Charges:  OT General Charges ?$OT Visit: 1 Visit ?OT Evaluation ?$OT Eval Low Complexity: 1 Low ?OT Treatments ?$Self Care/Home Management : 8-22 mins ? ?Alphia Moh OTR/L  ?Acute Rehab Services  ?[phone] office number ?[phone] pager number ? ? ?Alphia Moh ?03/09/2022, 1:03 PM ?

## 2022-03-09 NOTE — Evaluation (Addendum)
Physical Therapy Evaluation ?Patient Details ?Name: Misty Gray ?MRN: [id number] ?DOB: December 20, 1950 ?Today's Date: 03/09/2022 ? ?History of Present Illness ? 71 yo female admitted 3/23, presents with acute on chronic HF.  [id number] afib, s/p PPM, systolic HF (NICM)  ?Clinical Impression ? Pt admitted with above diagnosis. Pt was able to ambulate around the bed with mod assist at times as she is unsteady and reaching for furniture to steady herself. Will try RW at next session. HR to 155 bpm during ambulation.  Nurse made aware. If son can provide 24 hour care, HHPT is appropriate. If not, may need short SNF stay.  Will follow acutely.  Pt currently with functional limitations due to the deficits listed below (see PT Problem List). Pt will benefit from skilled PT to increase their independence and safety with mobility to allow discharge to the venue listed below.      ?   ? ?Recommendations for follow up therapy are one component of a multi-disciplinary discharge planning process, led by the attending physician.  Recommendations may be updated based on patient status, additional functional criteria and insurance authorization. ? ?Follow Up Recommendations Skilled nursing-short term rehab (<3 hours/day) (unless pt has 24 hour care.  If she has 24 hour care, can go home with HHPT) ? ?  ?Assistance Recommended at Discharge Frequent or constant Supervision/Assistance  ?Patient can return home with the following ? A lot of help with walking and/or transfers;A little help with bathing/dressing/bathroom;Help with stairs or ramp for entrance ? ?  ?Equipment Recommendations None recommended by PT  ?Recommendations for Other Services ?    ?  ?Functional Status Assessment Patient has had a recent decline in their functional status and demonstrates the ability to make significant improvements in function in a reasonable and predictable amount of time.  ? ?  ?Precautions / Restrictions Precautions ?Precautions:  Fall ?Restrictions ?Weight Bearing Restrictions: No  ? ?  ? ?Mobility ? Bed Mobility ?Overal bed mobility: Needs Assistance ?Bed Mobility: Supine to Sit ?  ?  ?Supine to sit: Min assist ?  ?  ?General bed mobility comments: Needed min assist to come to EOB.  Assist for elevation of trunk but pt did initiate movement to EOB. ?  ? ?Transfers ?Overall transfer level: Needs assistance ?Equipment used: 1 person hand held assist ?Transfers: Sit to/from Stand ?Sit to Stand: Min assist ?  ?  ?  ?  ?  ?General transfer comment: Pt needed min assist to steady to stand as she takes time to get her balance with posterior lean initially.  Needs bil UE support as therapist provided [place] and pt was reaching for furniture in room. ?  ? ?Ambulation/Gait ?Ambulation/Gait assistance: Mod assist, Min assist ?Gait Distance (Feet): 25 Feet ?Assistive device: 1 person hand held assist (pt reaching for furniture withother hand) ?Gait Pattern/deviations: Step-through pattern, Decreased stride length, Trunk flexed, Wide base of support, Drifts right/left, Leaning posteriorly, Knee flexed in stance - right, Knee flexed in stance - left ?  ?Gait velocity interpretation: <1.31 ft/sec, indicative of household ambulator ?  ?General Gait Details: Pt ambulated around the bed needing 1 HHA and pt reaching for furniture wherever she could with other hand to walk around bed to get to chair. Pt maintains flexed posture and needs cues and assist to stand tall as well as for safety as pt is unsteady and needed min to mod assist for safety with ambulation.  Pt most likely needs to use device for safety. ? ?Stairs ?  ?  ?  ?  ?  ? ?  Wheelchair Mobility ?  ? ?Modified Rankin (Stroke Patients Only) ?  ? ?  ? ?Balance Overall balance assessment: Needs assistance ?Sitting-balance support: No upper extremity supported, Feet supported ?Sitting balance-Leahy Scale: Fair ?Sitting balance - Comments: can sit EOB on her own without UE support or assist ?Postural  control: Posterior lean ?Standing balance support: Bilateral upper extremity supported, During functional activity ?Standing balance-Leahy Scale: Poor ?Standing balance comment: relies on bil UE support for balance ?  ?  ?  ?  ?  ?  ?  ?  ?  ?  ?  ?   ? ? ? ?Pertinent Vitals/Pain Pain Assessment ?Pain Assessment: No/denies pain  ? ? ?Home Living Family/patient expects to be discharged to:: Private residence ?Living Arrangements: Children (lives with son, husband just died) ?Available Help at Discharge: Family;Available 24 hours/day ?Type of Home: House ?Home Access: Stairs to enter ?Entrance Stairs-Rails: Right ?Entrance Stairs-Number of Steps: 3 ?  ?Home Layout: One level ?Home Equipment: Cane - single Barista (2 wheels);Rollator (4 wheels);BSC/3in1 (2LO2) ?   ?  ?Prior Function   ?  ?  ?  ?  ?  ?  ?Mobility Comments: did not use device PTA, 1 recent fall ?  ?  ? ? ?Hand Dominance  ? Dominant Hand: Right ? ?  ?Extremity/Trunk Assessment  ? Upper Extremity Assessment ?Upper Extremity Assessment: Defer to OT evaluation ?  ? ?Lower Extremity Assessment ?Lower Extremity Assessment: Generalized weakness ?  ? ?Cervical / Trunk Assessment ?Cervical / Trunk Assessment: Kyphotic  ?Communication  ? Communication: Expressive difficulties (mumbled/garbled speech at times)  ?Cognition Arousal/Alertness: Awake/alert ?Behavior During Therapy: [facility] for tasks assessed/performed ?Overall Cognitive Status: Within Functional Limits for tasks assessed ?  ?  ?  ?  ?  ?  ?  ?  ?  ?  ?  ?  ?  ?  ?  ?  ?  ?  ?  ? ?  ?General Comments General comments (skin integrity, edema, etc.): HR 108-137 bpm at rest just talking to PT, 109/85.  HR to 155 bpm with walking.  Nurse aware.  Did return to 122 bpm once pt in chair and resting.  Pt sats >90% on 3LO2 ? ?  ?Exercises General Exercises - Lower Extremity ?Long Arc Quad: AROM, 10 reps, Both, Seated  ? ?Assessment/Plan  ?  ?PT Assessment Patient needs continued PT services  ?PT Problem  List Decreased activity tolerance;Decreased balance;Decreased mobility;Decreased knowledge of use of DME;Decreased safety awareness;Decreased knowledge of precautions;Cardiopulmonary status limiting activity;Obesity ? ?   ?  ?PT Treatment Interventions DME instruction;Gait training;Functional mobility training;Therapeutic activities;Therapeutic exercise;Balance training;Patient/family education;Stair training   ? ?PT Goals (Current goals can be found in the Care Plan section)  ?Acute Rehab PT Goals ?Patient Stated Goal: to get better ?PT Goal Formulation: With patient ?Time For Goal Achievement: 03/23/22 ?Potential to Achieve Goals: Good ? ?  ?Frequency Min 3X/week ?  ? ? ?Co-evaluation   ?  ?  ?  ?  ? ? ?  ?AM-PAC PT "6 Clicks" Mobility  ?Outcome Measure Help needed turning from your back to your side while in a flat bed without using bedrails?: A Little ?Help needed moving from lying on your back to sitting on the side of a flat bed without using bedrails?: A Little ?Help needed moving to and from a bed to a chair (including a wheelchair)?: A Lot ?Help needed standing up from a chair using your arms (e.g., wheelchair or bedside chair)?: A Lot ?  Help needed to walk in hospital room?: A Lot ?Help needed climbing 3-5 steps with a railing? : A Lot ?6 Click Score: 14 ? ?  ?End of Session Equipment Utilized During Treatment: Gait belt;Oxygen ?Activity Tolerance: Patient tolerated treatment well;Patient limited by fatigue ?Patient left: in chair;with call bell/phone within reach;with chair alarm set ?Nurse Communication: Mobility status (Nurse made aware of HR incr) ?PT Visit Diagnosis: Unsteadiness on feet (R26.81);Muscle weakness (generalized) (M62.81) ?  ? ?Time: [id number] ?PT Time Calculation (min) (ACUTE ONLY): 26 min ? ? ?Charges:   PT Evaluation ?$PT Eval Moderate Complexity: 1 Mod ?PT Treatments ?$Gait Training: 8-22 mins ?  ?   ? ? ?Ruble Buttler M,PT ?Acute Rehab Services ?[phone] ?[phone] (pager)  ? ?Alvira Philips ?03/09/2022, 11:48 AM ? ?

## 2022-03-09 NOTE — Hospital Course (Addendum)
Misty Gray is a 71 y.o.female with a history of HFrEF, Afib, COPD, OSA, hx seizure, sick sinus syndrome w/ pacemaker, nonischemic cardiomyopathy who was admitted to the [facility] Teaching Service at [facility] for worsening shortness of breath. Her hospital course is detailed below: ? ?Acute on chronic systolic heart failure exacerbation ?Presented with shortness of breath x2 days and increased swelling of the legs with increased oxygen requirement as compared to 2 L [place] at home. Received Solu-Medrol 125 mg and albuterol prior to arrival. BNP 1900 with flat troponins. CXR showed moderate features of CHF with left-greater-than-right pleural effusions and overlying lung opacities.  Cardiology consulted and recommended IV Lasix and spironolactone. She was eventually started on metolazone due to poor diuresis response. Echo showed LVEF 20 to 25%, global hypokinesis, RV moderately enlarged, PAP moderately elevated, severely dilated bilateral atria. She received 5 days of diuresis with a discharge weight of 61.5 kg down from 66.8 kg. Discharged with instructions to take torsemide 40mg  BID.  ? ?Hypokalemia ?Potassium monitored throughout hospitalization and replenished as necessary. Continued to be depleted by torsemide. Patient discharged with instructions to take 40 mEq of Kcl BID. ? ?Atrial fibrillation; sick sinus syndrome w/ pacemaker ?Patient rate-controlled throughout admission. Cardiology consulted and increased Amiodarone from 100mg  to 200mg  twice daily. ? ?Other chronic conditions were medically managed with home medications and formulary alternatives as necessary (OSA) ? ?PCP Follow-up Recommendations: ?Repeat BMP to ensure resolution of hypokalemia ?If ambulating well and no orthostatic symptoms, consider restarting low dose ARB ?Ensure follow up at [facility] ?

## 2022-03-09 NOTE — Progress Notes (Signed)
FPTS Brief Progress Note ? ?S: Patient has no acute complaints and feels that her breathing is doing a little bit better. She does not want to use the hospital CPAP. ? ? ?O: ?BP 97/76 (BP Location: Left Wrist)   Pulse (!) 42   Temp 98 ?F (36.7 ?C) (Oral)   Resp 19   Ht 4\' 9"  (1.448 m)   Wt 66 kg   SpO2 97%   BMI 31.49 kg/m?   ?General: Elderly female, NAD, sitting up in bed ?Respiratory: Breathing comfortably on 2.5 L via [place], no increased work of breathing ?CV: On telemetry ? ?A/P: ?Acute on chronic systolic heart failure ?- Orders reviewed. Labs for AM ordered, which was adjusted as needed.  ?- Closely monitor for worsening of respiratory status ?- Continue medications per team daily progress note ? ?Patient refusing CPAP, discussed with patient but patient still denies. ?- Continue to encourage CPAP use ? ? , DO ?03/09/2022, 9:35 PM ?PGY-2, [facility] Night Resident  ?Please page [phone] with questions.  ? ? ?

## 2022-03-09 NOTE — Progress Notes (Signed)
?   03/09/22 1900  ?Clinical Encounter Type  ?Visited With Patient  ?Visit Type Spiritual support;Initial  ?Referral From Physician ?(Shelby Mattocks, DO)  ?Consult/Referral To Chaplain  ?Spiritual Encounters  ?Spiritual Needs Grief support;Emotional;Prayer  ? ?Spiritual Care Consultation with Mrs. Misty Gray at the request of Dr. Shelby Mattocks, DO for grief support. Patient shared many aspects of her life long [place] spiritual journey. Misty Gray holds extremely strong faith and hope in a loving God. At Misty Gray request we shared in Prayer for comfort, mercy, peace and healing. [address] [place], Misty Gray., [phone]  ?

## 2022-03-09 NOTE — Progress Notes (Signed)
Mobility Specialist Progress Note: ? ? 03/09/22 1100  ?Mobility  ?Activity Transferred from chair to bed  ?Level of Assistance Contact guard assist, steadying assist  ?Assistive Device Other (Comment) ?(HHA)  ?Distance Ambulated (ft) 2 ft  ?Activity Response Tolerated well  ?$Mobility charge 1 Mobility  ? ?Pt requesting to get back to bed after sitting up in chair. Required 1 person HHA to transfer. Pt left in bed with bed alarm on, all needs met.  ? ?Nelta Numbers ?Acute Rehab ?Phone: [phone] ?Office Phone: [phone] ? ?

## 2022-03-09 NOTE — Progress Notes (Signed)
? ?DAILY PROGRESS NOTE  ? ?[name] ?Date of Encounter: 03/09/2022 ?Cardiologist: None ? ?Chief Complaint  ? ?Breathing unchanged ? ?Patient Profile  ? ?71 yo female with chronic afib, s/p PPM, systolic HF (NICM), presents with acute on chronic HF ? ?Subjective  ? ?Diuresed very little overnight, net positive today. Weight is up but has received lasix 80 mg IV twice. ? ?Objective  ? ?Vitals:  ? 03/09/22 0022 03/09/22 0025 03/09/22 4403 03/09/22 0750  ?BP:  106/77 100/62 109/85  ?Pulse: 82 96 62 96  ?Resp: (!) 24 20 17  (!) 24  ?Temp:  98.4 ?F (36.9 ?C) 97.6 ?F (36.4 ?C) (!) 97.5 ?F (36.4 ?C)  ?TempSrc:  Oral Oral Oral  ?SpO2: 99% 100% 100% 100%  ?Weight:   66 kg   ?Height:      ? ? ?Intake/Output Summary (Last 24 hours) at 03/09/2022 0923 ?Last data filed at 03/09/2022 [phone] ?Gross per 24 hour  ?Intake 1708 ml  ?Output 1475 ml  ?Net 233 ml  ? ?Filed Weights  ? 03/07/22 2354 03/08/22 1605 03/09/22 0457  ?Weight: 66.8 kg 65.1 kg 66 kg  ? ? ?Physical Exam  ? ?General appearance: alert, no distress, and sitting upright in chair ?Neck: JVD - 5 cm above sternal notch and no carotid bruit ?Lungs: diminished breath sounds bibasilar ?Heart: irregularly irregular rhythm ?Abdomen: soft, non-tender; bowel sounds normal; no masses,  no organomegaly ?Extremities: edema 1+ bilateral, stockings in place ?Pulses: 2+ and symmetric ?Skin: Skin color, texture, turgor normal. No rashes or lesions ?Neurologic: Grossly normal ?Psych: Pleasant, broke into a story about her dead husband when I asked her how she felt today ? ?Inpatient Medications  ?  ?Scheduled Meds: ? amiodarone  200 mg Oral Daily  ? edoxaban  30 mg Oral Daily  ? furosemide  80 mg Intravenous BID  ? mouth rinse  15 mL Mouth Rinse BID  ? PHENobarbital  64.8 mg Oral BID  ? spironolactone  25 mg Oral Daily  ? ? ?Continuous Infusions: ? ? ?PRN Meds: ?acetaminophen **OR** acetaminophen  ? ?Labs  ? ?Results for orders placed or performed during the hospital  encounter of 03/07/22 (from the past 48 hour(s))  ?CBC with Differential/Platelet     Status: Abnormal  ? Collection Time: 03/08/22 12:10 AM  ?Result Value Ref Range  ? WBC 7.8 4.0 - 10.5 K/uL  ? RBC 4.70 3.87 - 5.11 MIL/uL  ? Hemoglobin 14.3 12.0 - 15.0 g/dL  ? HCT 45.0 36.0 - 46.0 %  ? MCV 95.7 80.0 - 100.0 fL  ? MCH 30.4 26.0 - 34.0 pg  ? MCHC 31.8 30.0 - 36.0 g/dL  ? RDW 15.1 11.5 - 15.5 %  ? Platelets 100 (L) 150 - 400 K/uL  ?  Comment: Immature Platelet Fraction may be ?clinically indicated, consider ?ordering this additional test ?[id number] ?REPEATED TO VERIFY ?PLATELET COUNT CONFIRMED BY SMEAR ?  ? nRBC 0.0 0.0 - 0.2 %  ? Neutrophils Relative % 80 %  ? Neutro Abs 6.3 1.7 - 7.7 K/uL  ? Lymphocytes Relative 14 %  ? Lymphs Abs 1.1 0.7 - 4.0 K/uL  ? Monocytes Relative 6 %  ? Monocytes Absolute 0.4 0.1 - 1.0 K/uL  ? Eosinophils Relative 0 %  ? Eosinophils Absolute 0.0 0.0 - 0.5 K/uL  ? Basophils Relative 0 %  ? Basophils Absolute 0.0 0.0 - 0.1 K/uL  ? Immature Granulocytes 0 %  ? Abs Immature Granulocytes 0.02 0.00 - 0.07  K/uL  ?  Comment: Performed at [facility], [address], [place], [place] [address]  ?Comprehensive metabolic panel     Status: Abnormal  ? Collection Time: 03/08/22 12:10 AM  ?Result Value Ref Range  ? Sodium 136 135 - 145 mmol/L  ? Potassium 3.9 3.5 - 5.1 mmol/L  ? Chloride 100 98 - 111 mmol/L  ? CO2 23 22 - 32 mmol/L  ? Glucose, Bld 131 (H) 70 - 99 mg/dL  ?  Comment: Glucose reference range applies only to samples taken after fasting for at least 8 hours.  ? BUN 25 (H) 8 - 23 mg/dL  ? Creatinine, Ser 1.09 (H) 0.44 - 1.00 mg/dL  ? Calcium 9.9 8.9 - 10.3 mg/dL  ? Total Protein 7.3 6.5 - 8.1 g/dL  ? Albumin 3.4 (L) 3.5 - 5.0 g/dL  ? AST 33 15 - 41 U/L  ? ALT 40 0 - 44 U/L  ? Alkaline Phosphatase 125 38 - 126 U/L  ? Total Bilirubin 1.9 (H) 0.3 - 1.2 mg/dL  ? GFR, Estimated 54 (L) >60 mL/min  ?  Comment: (NOTE) ?Calculated using the CKD-EPI Creatinine Equation (2021) ?  ? Anion gap 13 5 -  15  ?  Comment: Performed at [facility], [address] [place], [place] [address]  ?Troponin I (High Sensitivity)     Status: Abnormal  ? Collection Time: 03/08/22 12:10 AM  ?Result Value Ref Range  ? Troponin I (High Sensitivity) 55 (H) <18 ng/L  ?  Comment: (NOTE) ?Elevated high sensitivity troponin I (hsTnI) values and significant  ?changes across serial measurements may suggest ACS but many other  ?chronic and acute conditions are known to elevate hsTnI results.  ?Refer to the "Links" section for chest pain algorithms and additional  ?guidance. ?Performed at [facility], [address], [place], [place] ?[address] ?  ?Brain natriuretic peptide     Status: Abnormal  ? Collection Time: 03/08/22 12:10 AM  ?Result Value Ref Range  ? B Natriuretic Peptide 1,902.5 (H) 0.0 - 100.0 pg/mL  ?  Comment: Performed at [facility], [address], [place], [place] [address]  ?Resp Panel by RT-PCR (Flu A&B, Covid) Nasopharyngeal Swab     Status: None  ? Collection Time: 03/08/22  3:27 AM  ? Specimen: Nasopharyngeal Swab; Nasopharyngeal(NP) swabs in vial transport medium  ?Result Value Ref Range  ? SARS Coronavirus 2 by RT PCR NEGATIVE NEGATIVE  ?  Comment: (NOTE) ?SARS-CoV-2 target nucleic acids are NOT DETECTED. ? ?The SARS-CoV-2 RNA is generally detectable in upper respiratory ?specimens during the acute phase of infection. The lowest ?concentration of SARS-CoV-2 viral copies this assay can detect is ?138 copies/mL. A negative result does not preclude SARS-Cov-2 ?infection and should not be used as the sole basis for treatment or ?other patient management decisions. A negative result may occur with  ?improper specimen collection/handling, submission of specimen other ?than nasopharyngeal swab, presence of viral mutation(s) within the ?areas targeted by this assay, and inadequate number of viral ?copies(<138 copies/mL). A negative result must be combined with ?clinical observations, patient history,  and epidemiological ?information. The expected result is Negative. ? ?Fact Sheet for Patients:  ?BloggerCourse.com ? ?Fact Sheet for Healthcare Providers:  ?SeriousBroker.it ? ?This test is no t yet approved or cleared by the [place] FDA and  ?has been authorized for detection and/or diagnosis of SARS-CoV-2 by ?FDA under an Emergency Use Authorization (EUA). This EUA will remain  ?  in effect (meaning this test can be used) for the duration of the ?COVID-19 declaration under Section 564(b)(1) of the Act, 21 ?U.S.C.section 360bbb-3(b)(1), unless the authorization is terminated  ?or revoked sooner.  ? ? ?  ? Influenza A by PCR NEGATIVE NEGATIVE  ? Influenza B by PCR NEGATIVE NEGATIVE  ?  Comment: (NOTE) ?The Xpert Xpress SARS-CoV-2/FLU/RSV plus assay is intended as an aid ?in the diagnosis of influenza from Nasopharyngeal swab specimens and ?should not be used as a sole basis for treatment. Nasal washings and ?aspirates are unacceptable for Xpert Xpress SARS-CoV-2/FLU/RSV ?testing. ? ?Fact Sheet for Patients: ?BloggerCourse.com ? ?Fact Sheet for Healthcare Providers: ?SeriousBroker.it ? ?This test is not yet approved or cleared by the [place] FDA and ?has been authorized for detection and/or diagnosis of SARS-CoV-2 by ?FDA under an Emergency Use Authorization (EUA). This EUA will remain ?in effect (meaning this test can be used) for the duration of the ?COVID-19 declaration under Section 564(b)(1) of the Act, 21 U.S.C. ?section 360bbb-3(b)(1), unless the authorization is terminated or ?revoked. ? ?Performed at [facility], [address], [place], [place] ?[address] ?  ?Troponin I (High Sensitivity)     Status: Abnormal  ? Collection Time: 03/08/22  5:22 AM  ?Result Value Ref Range  ? Troponin I (High Sensitivity) 47 (H) <18 ng/L  ?  Comment: (NOTE) ?Elevated high sensitivity troponin I (hsTnI) values and  significant  ?changes across serial measurements may suggest ACS but many other  ?chronic and acute conditions are known to elevate hsTnI results.  ?Refer to the "Links" section for chest pain algorithms an

## 2022-03-09 NOTE — Progress Notes (Signed)
Patient refused CPAP for tonight. Unit still at bedside. 

## 2022-03-09 NOTE — Progress Notes (Signed)
Family Medicine Teaching Service ?Daily Progress Note ?Intern Pager: [phone] ? ?[name] Medical record number: [id number] ?Date of birth: [birth year] Age: 71 y.o. Gender: female ? ?Primary Care Provider: Latrelle Dodrill, MD ?Consultants: Cardiology ?Code Status: DNR ? ?Pt Overview and Major Events to Date:  ?3/24: admitted ? ?Assessment and Plan: ?DAMESHIA SEYBOLD is a 71 y.o. female presenting with worsening shortness of breath. PMH is significant for heart failure with reduced ejection fraction, A-fib, COPD,OSA, history of seizure, sick sinus syndrome with pacemaker, nonischemic cardiomyopathy. ? ?AoC systolic heart failure exacerbation ?Admitted with weight of 66.8kg, now 66kg. Output of 1.3L yesterday.  Echo showed LVEF 20 to 25%, global hypokinesis, RV moderately enlarged, PAP moderately elevated, severely dilated bilateral atria. ?-Cardiology following, appreciate recommendations ?-IV lasix 80mg  BID ?-Continue spironolactone 25mg  daily ?-Start metolazone 5mg  daily ?-daily weights ?-strict I/Os ?-PT/OT eval and treat ? ?Afib  Sick sinus syndrome w/ pacemaker  electrolyte management ?Rate controlled in the 80s-90s generally overnight. Mag 1.7, goal >2 ?-Continue edoxaban at lowered dosage given  ?-Amiodarone 200 mg daily ?-IV mag 2g ?-A.m. BMP, magnesium ? ?Thrombocytopenia: Chronic ?Platelets on admission 100.  This morning 74. ?-Hold anticoagulation if less than 50 ?-A.m. CBC ? ?Acute grief reaction ?Patient endorses daily Prozac.  Has been recently died.  Amenable to chaplain consult. ?-Consult chaplain for recent loss ?-Restart Prozac 10 mg daily ? ?FEN/GI: Heart healthy ?PPx: Edoxaban ([place]) ?Dispo:SNF pending clinical improvement . Barriers include diuresis.  ? ?Subjective:  ?Patient endorses improved breathing and that her husband recently died and she has been very sad about this. ? ?Objective: ?Temp:  [97.5 ?F (36.4 ?C)-98.4 ?F (36.9 ?C)] 97.5 ?F (36.4 ?C) (03/25 0750) ?Pulse  Rate:  [62-107] 96 (03/25 0750) ?Resp:  [17-24] 24 (03/25 0750) ?BP: (92-120)/(60-85) 109/85 (03/25 0750) ?SpO2:  [97 %-100 %] 100 % (03/25 0750) ?Weight:  [65.1 kg-66 kg] 66 kg (03/25 0457) ?Physical Exam: ?General: awake, alert, NAD sitting in recliner ?Cardiovascular: irregularly irregular, no murmurs auscultated ?Respiratory: CTAB, no increased work of breathing, [place] in place on 3L ?Extremities: +1 pitting edema of LEs, TED hose on ? ?Laboratory: ?Recent Labs  ?Lab 03/08/22 ?0010 03/09/22 ?0227  ?WBC 7.8 5.8  ?HGB 14.3 12.6  ?HCT 45.0 39.1  ?PLT 100* 74*  ? ?Recent Labs  ?Lab 03/08/22 ?0010 03/09/22 ?0227  ?NA 136 138  ?K 3.9 3.7  ?CL 100 103  ?CO2 23 29  ?BUN 25* 29*  ?CREATININE 1.09* 0.95  ?CALCIUM 9.9 9.3  ?PROT 7.3 6.0*  ?BILITOT 1.9* 1.2  ?ALKPHOS 125 100  ?ALT 40 27  ?AST 33 20  ?GLUCOSE 131* 105*  ? ?Mag 1.7 ? ?Imaging/Diagnostic Tests: ?ECHOCARDIOGRAM COMPLETE ? ?Result Date: 03/08/2022 ?   ECHOCARDIOGRAM REPORT   Patient Name:   Misty Gray [facility] Date of Exam: 03/08/2022 Medical Rec #:  Lilli Few     Height:       57.0 in Accession #:    [facility]    Weight:       147.3 lb Date of Birth:  10-28-1951     BSA:          1.579 m? Patient Age:    70 years      BP:           112/80 mmHg Patient Gender: F             HR:           74 bpm. Exam Location:  Inpatient Procedure:  2D Echo, Cardiac Doppler, Color Doppler and Intracardiac            Opacification Agent Indications:    CHF  History:        Patient has prior history of Echocardiogram examinations. CHF,                 Arrythmias:Atrial Fibrillation; Risk Factors:Hypertension and                 Sleep Apnea.  Sonographer:    Cleatis Polka Referring Phys: [phone] LINDSAY NICOLE FINCH IMPRESSIONS  1. Left ventricular ejection fraction, by estimation, is 20 to 25%. The left ventricle has severely decreased function. The left ventricle demonstrates global hypokinesis. The left ventricular internal cavity size was mildly to moderately dilated. Left ventricular  diastolic function could not be evaluated.  2. Right ventricular systolic function is moderately reduced. The right ventricular size is moderately enlarged. There is moderately elevated pulmonary artery systolic pressure. The estimated right ventricular systolic pressure is 59.9 mmHg.  3. Left atrial size was severely dilated.  4. Right atrial size was severely dilated.  5. Moderate pleural effusion in both left and right lateral regions.  6. The mitral valve is normal in structure. Moderate to severe mitral valve regurgitation. No evidence of mitral stenosis.  7. Tricuspid valve regurgitation is mild to moderate.  8. The aortic valve is tricuspid. There is mild calcification of the aortic valve. Aortic valve regurgitation is not visualized. No aortic stenosis is present.  9. The inferior vena cava is dilated in size with <50% respiratory variability, suggesting right atrial pressure of 15 mmHg. Comparison(s): No significant change from prior study. Conclusion(s)/Recommendation(s): Severely reduced LVEF, global hypokinesis, mod-severe MR, moderate TR, elevated right sided filling pressures. FINDINGS  Left Ventricle: LV thrombus excluded by contrast. Left ventricular ejection fraction, by estimation, is 20 to 25%. The left ventricle has severely decreased function. The left ventricle demonstrates global hypokinesis. Definity contrast agent was given IV to delineate the left ventricular endocardial borders. The left ventricular internal cavity size was mildly to moderately dilated. There is no left ventricular hypertrophy. Left ventricular diastolic function could not be evaluated due to atrial fibrillation. Left ventricular diastolic function could not be evaluated. Right Ventricle: The right ventricular size is moderately enlarged. Right vetricular wall thickness was not well visualized. Right ventricular systolic function is moderately reduced. There is moderately elevated pulmonary artery systolic pressure. The  tricuspid regurgitant velocity is 3.35 m/s, and with an assumed right atrial pressure of 15 mmHg, the estimated right ventricular systolic pressure is 59.9 mmHg. Left Atrium: Left atrial size was severely dilated. Right Atrium: Right atrial size was severely dilated. Pericardium: There is no evidence of pericardial effusion. Mitral Valve: The mitral valve is normal in structure. There is mild thickening of the mitral valve leaflet(s). Moderate to severe mitral valve regurgitation. No evidence of mitral valve stenosis. Tricuspid Valve: The tricuspid valve is normal in structure. Tricuspid valve regurgitation is mild to moderate. No evidence of tricuspid stenosis. Aortic Valve: The aortic valve is tricuspid. There is mild calcification of the aortic valve. Aortic valve regurgitation is not visualized. No aortic stenosis is present. Aortic valve peak gradient measures 6.2 mmHg. Pulmonic Valve: The pulmonic valve was not well visualized. Pulmonic valve regurgitation is mild to moderate. No evidence of pulmonic stenosis. Aorta: The aortic root, ascending aorta, aortic arch and descending aorta are all structurally normal, with no evidence of dilitation or obstruction. Venous: The inferior vena cava is dilated in  size with less than 50% respiratory variability, suggesting right atrial pressure of 15 mmHg. IAS/Shunts: The atrial septum is grossly normal. Additional Comments: A device lead is visualized. There is a moderate pleural effusion in both left and right lateral regions.  LEFT VENTRICLE PLAX 2D LVIDd:         6.10 cm      Diastology LVIDs:         5.60 cm      LV e' medial:    5.11 cm/s LV PW:         0.90 cm      LV E/e' medial:  18.3 LV IVS:        0.90 cm      LV e' lateral:   6.64 cm/s LVOT diam:     1.80 cm      LV E/e' lateral: 14.1 LV SV:         29 LV SV Index:   18 LVOT Area:     2.54 cm?  LV Volumes (MOD) LV vol d, MOD A2C: 195.0 ml LV vol d, MOD A4C: 232.0 ml LV vol s, MOD A2C: 144.0 ml LV vol s, MOD  A4C: 181.0 ml LV SV MOD A2C:     51.0 ml LV SV MOD A4C:     232.0 ml LV SV MOD BP:      52.3 ml RIGHT VENTRICLE            IVC RV Basal diam:  4.60 cm    IVC diam: 2.90 cm RV S prime:     6.20 cm/s TAPSE (M-mode): 1.2

## 2022-03-10 DIAGNOSIS — I5021 Acute systolic (congestive) heart failure: Secondary | ICD-10-CM | POA: Diagnosis not present

## 2022-03-10 LAB — CBC
HCT: 40.9 % (ref 36.0–46.0)
Hemoglobin: 13.5 g/dL (ref 12.0–15.0)
MCH: 30.3 pg (ref 26.0–34.0)
MCHC: 33 g/dL (ref 30.0–36.0)
MCV: 91.7 fL (ref 80.0–100.0)
Platelets: 71 10*3/uL — ABNORMAL LOW (ref 150–400)
RBC: 4.46 MIL/uL (ref 3.87–5.11)
RDW: 14.5 % (ref 11.5–15.5)
WBC: 5.9 10*3/uL (ref 4.0–10.5)
nRBC: 0 % (ref 0.0–0.2)

## 2022-03-10 LAB — BASIC METABOLIC PANEL
Anion gap: 10 (ref 5–15)
BUN: 27 mg/dL — ABNORMAL HIGH (ref 8–23)
CO2: 36 mmol/L — ABNORMAL HIGH (ref 22–32)
Calcium: 9.4 mg/dL (ref 8.9–10.3)
Chloride: 88 mmol/L — ABNORMAL LOW (ref 98–111)
Creatinine, Ser: 0.99 mg/dL (ref 0.44–1.00)
GFR, Estimated: >60 mL/min (ref >60)
Glucose, Bld: 143 mg/dL — ABNORMAL HIGH (ref 70–99)
Potassium: 4.3 mmol/L (ref 3.5–5.1)
Sodium: 134 mmol/L — ABNORMAL LOW (ref 135–145)

## 2022-03-10 LAB — COMPREHENSIVE METABOLIC PANEL
ALT: 26 U/L (ref 0–44)
AST: 19 U/L (ref 15–41)
Albumin: 2.8 g/dL — ABNORMAL LOW (ref 3.5–5.0)
Alkaline Phosphatase: 104 U/L (ref 38–126)
Anion gap: 8 (ref 5–15)
BUN: 31 mg/dL — ABNORMAL HIGH (ref 8–23)
CO2: 38 mmol/L — ABNORMAL HIGH (ref 22–32)
Calcium: 9.2 mg/dL (ref 8.9–10.3)
Chloride: 88 mmol/L — ABNORMAL LOW (ref 98–111)
Creatinine, Ser: 1.1 mg/dL — ABNORMAL HIGH (ref 0.44–1.00)
GFR, Estimated: 53 mL/min — ABNORMAL LOW (ref >60)
Glucose, Bld: 102 mg/dL — ABNORMAL HIGH (ref 70–99)
Potassium: 2.2 mmol/L — CL (ref 3.5–5.1)
Sodium: 134 mmol/L — ABNORMAL LOW (ref 135–145)
Total Bilirubin: 1.4 mg/dL — ABNORMAL HIGH (ref 0.3–1.2)
Total Protein: 6.5 g/dL (ref 6.5–8.1)

## 2022-03-10 LAB — PHENOBARBITAL LEVEL: Phenobarbital: 20 ug/mL (ref 15.0–30.0)

## 2022-03-10 LAB — MAGNESIUM: Magnesium: 1.8 mg/dL (ref 1.7–2.4)

## 2022-03-10 MED ORDER — METOLAZONE 2.5 MG PO TABS
2.5000 mg | ORAL_TABLET | Freq: Every day | ORAL | Status: DC
Start: 2022-03-10 — End: 2022-03-11
  Administered 2022-03-10: 2.5 mg via ORAL
  Filled 2022-03-10: qty 1

## 2022-03-10 MED ORDER — POTASSIUM CHLORIDE CRYS ER 20 MEQ PO TBCR: 40.0000 meq | EXTENDED_RELEASE_TABLET | ORAL | Status: DC

## 2022-03-10 MED ORDER — POTASSIUM CHLORIDE CRYS ER 20 MEQ PO TBCR
40.0000 meq | EXTENDED_RELEASE_TABLET | Freq: Once | ORAL | Status: AC
  Administered 2022-03-10: 40 meq via ORAL
  Filled 2022-03-10: qty 2

## 2022-03-10 MED ORDER — MAGNESIUM SULFATE IN D5W 1-5 GM/100ML-% IV SOLN
1.0000 g | Freq: Once | INTRAVENOUS | Status: AC
  Administered 2022-03-10: 1 g via INTRAVENOUS
  Filled 2022-03-10: qty 100

## 2022-03-10 MED ORDER — POTASSIUM CHLORIDE 20 MEQ PO PACK
40.0000 meq | PACK | ORAL | Status: AC
  Administered 2022-03-10 (×2): 40 meq via ORAL
  Filled 2022-03-10 (×2): qty 2

## 2022-03-10 MED ORDER — POTASSIUM CHLORIDE CRYS ER 20 MEQ PO TBCR: 40.0000 meq | EXTENDED_RELEASE_TABLET | Freq: Once | ORAL | Status: DC

## 2022-03-10 MED ORDER — POTASSIUM CHLORIDE 10 MEQ/100ML IV SOLN
10.0000 meq | INTRAVENOUS | Status: DC
  Administered 2022-03-10 (×2): 10 meq via INTRAVENOUS
  Filled 2022-03-10 (×2): qty 100

## 2022-03-10 MED ORDER — POTASSIUM CHLORIDE 20 MEQ PO PACK
40.0000 meq | PACK | Freq: Two times a day (BID) | ORAL | Status: DC
Start: 2022-03-10 — End: 2022-03-10

## 2022-03-10 NOTE — Progress Notes (Signed)
Family Medicine Teaching Service ?Daily Progress Note ?Intern Pager: [phone] ? ?[name] Medical record number: [id number] ?Date of birth: 1951-04-20 Age: 71 y.o. Gender: female ? ?Primary Care Provider: [name], MD ?Consultants: Cardiology ?Code Status: DNR ? ?Pt Overview and Major Events to Date:  ?3/24: Admitted ? ?Assessment and Plan: ?YU MCNISH is a 71 y.o. female presenting with worsening shortness of breath, found to have acute systolic heart failure PMH significant for HFrEF, A-fib, COPD, OSA, history of seizure, sick sinus syndrome with pacemaker, nonischemic cardiomyopathy ? ?Acute on chronic systolic heart failure exacerbation ?Weight 61.7kg (loss of about 5kg since admission).  Output 3.147mL in the last 24 hours. ?- Cardiology following, appreciate recs ?- IV Lasix 80 mg twice daily ?- Spironolactone 20 mg daily ?- Metolazone 5 mg daily ?- Daily weights ?- Strict I's and O's ?- PT/OT eval and treat ? ?Altered speech pattern ?Patient is feeling were noted this yesterday, CT head was negative at that time.  Patient is on phenobarbital, will obtain a level to determine if this could be affecting speech pattern. ?- check phenobarb level ? ?Hypokalemia  Hypomagnesemia ?K severely low at 2.2, Mag 1.8. ?- Replete with K-Dur 40 mEq p.o. and IV KCl 10 mEq x4 doses ?- Replete mag with 1 g IV ?- Repeat mag level in the a.m. ?- Repeat BMP today ? ?A-fib  sick sinus syndrome with pacemaker ?Rate controlled overnight with some bradycardic readings while asleep.  Mag 1.8 ?- Continue edoxaban ?- Amiodarone 20 mg daily ?- Repleting mag with 1g ? ?Thrombocytopenia, chronic, stable. ?Platelets stable at 71. ?- Hold anticoagulation if platelets <50 ?- Trend with CBC ? ?Acute grief reaction ?Patient visited by chaplain on 3/25. ?- Prozac 10 mg daily ? ?FEN/GI: Heart healthy ?PPx: Full anticoagulation ?Dispo:SNF pending clinical improvement . Barriers include aggressive diuresis.   ? ?Subjective:  ?Patient is very difficult to understand but reports that she is doing okay and has no complaints at this time.  She feels like she is doing a little bit better than she was when she came in. ? ?Objective: ?Temp:  [97.5 ?F (36.4 ?C)-98 ?F (36.7 ?C)] 97.6 ?F (36.4 ?C) (03/26 0326) ?Pulse Rate:  [42-96] 51 (03/26 0326) ?Resp:  [16-24] 16 (03/26 0326) ?BP: (97-115)/(58-89) 100/62 (03/26 0326) ?SpO2:  [94 %-100 %] 96 % (03/26 0326) ?Weight:  [61.7 kg-66 kg] 61.7 kg (03/26 0326) ?Physical Exam: ?General: NAD elderly female, sitting up in bed ?Cardiovascular: RRR, S1/S2 present ?Respiratory: Breathing comfortably on 2 L [place], no increased work of breathing ?Abdomen: Soft, nontender, nondistended ?Extremities: Moving all extremities equally and appropriately ? ?Laboratory: ?Recent Labs  ?Lab 03/08/22 ?0010 03/09/22 ?[id number] 03/10/22 ?0258  ?WBC 7.8 5.8 5.9  ?HGB 14.3 12.6 13.5  ?HCT 45.0 39.1 40.9  ?PLT 100* 74* 71*  ? ?Recent Labs  ?Lab 03/08/22 ?0010 03/09/22 ?[id number] 03/10/22 ?0258  ?NA 136 138 134*  ?K 3.9 3.7 2.2*  ?CL 100 103 88*  ?CO2 23 29 38*  ?BUN 25* 29* 31*  ?CREATININE 1.09* 0.95 1.10*  ?CALCIUM 9.9 9.3 9.2  ?PROT 7.3 6.0* 6.5  ?BILITOT 1.9* 1.2 1.4*  ?ALKPHOS 125 100 104  ?ALT 40 27 26  ?AST 33 20 19  ?GLUCOSE 131* 105* 102*  ? ? ? ?Imaging/Diagnostic Tests: ?CT HEAD WO CONTRAST (5MM) ? ?Result Date: 03/09/2022 ?CLINICAL DATA:  Acute change in speech EXAM: CT HEAD WITHOUT CONTRAST TECHNIQUE: Contiguous axial images were obtained from the base of the skull  through the vertex without intravenous contrast. RADIATION DOSE REDUCTION: This exam was performed according to the departmental dose-optimization program which includes automated exposure control, adjustment of the mA and/or kV according to patient size and/or use of iterative reconstruction technique. COMPARISON:  CT head dated 02/23/2022. FINDINGS: Brain: No evidence of acute infarction, hemorrhage, hydrocephalus, extra-axial collection or mass  lesion/mass effect. Encephalomalacia involving the right frontal and right parietal lobes is redemonstrated. There is mild cerebral volume loss with associated ex vacuo dilatation. Periventricular white matter hypoattenuation likely represents chronic small vessel ischemic disease. Vascular: There are vascular calcifications in the carotid siphons. Skull: Normal. Negative for fracture or focal lesion. Sinuses/Orbits: No acute finding. Other: None. IMPRESSION: No acute intracranial process. Electronically Signed   By: Zerita Boers M.D.   On: 03/09/2022 15:18    ? ?Rise Patience, DO ?03/10/2022, 4:36 AM ?PGY-2, [place] ?[place] Intern pager: [phone], text pages welcome ? ?

## 2022-03-10 NOTE — NC FL2 (Addendum)
?[place] MEDICAID FL2 LEVEL OF CARE SCREENING TOOL  ?  ? ?IDENTIFICATION  ?Patient Name: ?Misty Gray Birthdate: [birth year] Sex: female Admission Date (Current Location): ?03/07/2022  ?[place] Number: ? [place] ?  Facility and Address:  ?[facility], [address], [place], [place] [address] ?     Provider Number: ?[id number]  ?Attending Physician Name and Address:  ?Carney Living, MD ? Relative Name and Phone Number:  ?Rupa, Lagan [phone]  [phone] ?   ?Current Level of Care: ?Hospital Recommended Level of Care: ?Skilled Nursing Facility Prior Approval Number: ?[id number]  ? ?Date Approved/Denied: ?  PASRR Number: ?  ? ?Discharge Plan: ?SNF ?  ? ?Current Diagnoses: ?Patient Active Problem List  ? Diagnosis Date Noted  ? Acute CHF (congestive heart failure) (HCC) 03/08/2022  ? Thrombocytopenia (HCC) 10/16/2021  ? Heart failure (HCC) 10/03/2021  ? Stasis dermatitis of both legs 08/05/2021  ? Hand injury, left, initial encounter 09/22/2020  ? CHF exacerbation (HCC) 07/12/2020  ? Hospital discharge follow-up 11/17/2019  ? Onychomycosis 11/17/2019  ? Abdominal wall hernia 11/05/2017  ? Anticoagulated 11/05/2017  ? Rash and nonspecific skin eruption 09/15/2017  ? Ventral hernia without obstruction or gangrene 08/15/2017  ? Chronic venous stasis dermatitis 12/26/2016  ? Urinary tract infection without hematuria   ? Frequent PVCs   ? Hypokalemia   ? Housing problems 11/23/2016  ? Chronic atrial fibrillation (HCC) 11/01/2016  ? Hypotension 10/31/2016  ? Healthcare maintenance 08/12/2016  ? Heme positive stool 08/09/2016  ? Intertrigo 08/09/2016  ? Chronic anticoagulation 04/26/2016  ? Chronic venous insufficiency   ? Cellulitis 09/07/2015  ? Breast mass, left 10/05/2014  ? Osteopenia 09/15/2014  ? Hypertension 01/04/2014  ? Chronic systolic dysfunction of left ventricle 05/05/2013  ? At high risk for falls 02/11/2013  ? Vitamin D deficiency 07/15/2012  ?  Sinoatrial node dysfunction (HCC) 05/08/2011  ? Pacemaker 08/10/2009  ? Allergic rhinitis 05/31/2008  ? Morbid obesity (HCC) 02/12/2007  ? GASTROESOPHAGEAL REFLUX, NO ESOPHAGITIS 02/12/2007  ? Female stress incontinence 02/12/2007  ? Seizure disorder (HCC) 02/12/2007  ? OSA (obstructive sleep apnea) 02/12/2007  ? ? ?Orientation RESPIRATION BLADDER Height & Weight   ?  ?Self, Time, Situation, Place ? O2 Continent, External catheter Weight: 136 lb 0.4 oz (61.7 kg) ?Height:  4\' 9"  (144.8 cm)  ?BEHAVIORAL SYMPTOMS/MOOD NEUROLOGICAL BOWEL NUTRITION STATUS  ?  Convulsions/Seizures Continent Diet (see discharge summary)  ?AMBULATORY STATUS COMMUNICATION OF NEEDS Skin   ?Limited Assist Verbally Other (Comment) (ecchymosis) ?  ?  ?  ?    ?     ?     ? ? ?Personal Care Assistance Level of Assistance  ?Bathing, Feeding, Dressing Bathing Assistance: Limited assistance ?Feeding assistance: Limited assistance ?Dressing Assistance: Limited assistance ?   ? ?Functional Limitations Info  ?Sight, Hearing, Speech Sight Info: Adequate ?Hearing Info: Adequate ?Speech Info: Adequate  ? ? ?SPECIAL CARE FACTORS FREQUENCY  ?PT (By licensed PT), OT (By licensed OT)   ?  ?PT Frequency: 5x week ?OT Frequency: 5x week ?  ?  ?  ?   ? ? ?Contractures Contractures Info: Not present  ? ? ?Additional Factors Info  ?Code Status, Allergies Code Status Info: DNR ?Allergies Info: Aspirin, Penicillins, Tape, Wool Alcohol (Lanolin), Amoxicillin, Ampicillin ?  ?  ?  ?   ? ?Current Medications (03/10/2022):  This is the current hospital active medication list ?Current Facility-Administered Medications  ?Medication Dose Route Frequency Provider Last Rate  Last Admin  ? acetaminophen (TYLENOL) tablet 650 mg  650 mg Oral Q6H PRN Katha Cabal, DO      ? Or  ? acetaminophen (TYLENOL) suppository 650 mg  650 mg Rectal Q6H PRN Brimage, Vondra, DO      ? amiodarone (PACERONE) tablet 200 mg  200 mg Oral Daily Andrey Farmer, PA-C   200 mg at 03/10/22 3149  ?  edoxaban (SAVAYSA) tablet 30 mg  30 mg Oral Daily Brimage, Vondra, DO   30 mg at 03/10/22 7026  ? FLUoxetine (PROZAC) capsule 10 mg  10 mg Oral QHS Ulis Rias, RPH   10 mg at 03/09/22 2113  ? furosemide (LASIX) injection 80 mg  80 mg Intravenous BID Andrey Farmer, PA-C   80 mg at 03/10/22 3785  ? magnesium sulfate IVPB 1 g 100 mL  1 g Intravenous Once Lilland, Alana, DO 100 mL/hr at 03/10/22 0906 1 g at 03/10/22 8850  ? MEDLINE mouth rinse  15 mL Mouth Rinse BID Carney Living, MD   15 mL at 03/10/22 0855  ? metolazone (ZAROXOLYN) tablet 2.5 mg  2.5 mg Oral Daily Chrystie Nose, MD   2.5 mg at 03/10/22 0855  ? PHENobarbital (LUMINAL) tablet 64.8 mg  64.8 mg Oral BID Brimage, Vondra, DO   64.8 mg at 03/10/22 0855  ? potassium chloride (KLOR-CON) packet 40 mEq  40 mEq Oral Q1 Hr x 2 Hilty, Lisette Abu, MD   40 mEq at 03/10/22 2774  ? spironolactone (ALDACTONE) tablet 25 mg  25 mg Oral Daily Laurey Morale, MD   25 mg at 03/10/22 1287  ? ? ? ?Discharge Medications: ?Please see discharge summary for a list of discharge medications. ? ?Relevant Imaging Results: ? ?Relevant Lab Results: ? ? ?Additional Information ?SSN: [id number] ? ?Lorri Frederick, LCSW ? ? ? ? ?

## 2022-03-10 NOTE — TOC Initial Note (Addendum)
Transition of Care (TOC) - Initial/Assessment Note  ? ? ?Patient Details  ?Name: Misty Gray ?MRN: [id number] ?Date of Birth: [birth year] ? ?Transition of Care [facility]) CM/SW Contact:    ?Joanne Chars, LCSW ?Phone Number: ?03/10/2022, 9:11 AM ? ?Clinical Narrative:    CSW met with pt regarding DC recommendation for SNF.  Pt agreeable to this, choice document given, permission given to send out referral in hub.  Permission given to speak with son Jeneen Rinks, who pt lives with.   ? ?CSW attempted to contact son Jeneen Rinks, no answer, left message. ? ?[place] Must system down, unable to obtain passr number.     ? ?[id number]: [place] Must back up, passr obtained: [phone] A. ?CSW spoke with son Jeneen Rinks on the phone, he is also in agreement with plan for SNF, will be at the hospital this afternoon and will look at the list of SNF options.   ?Referral sent out in hub for SNF.            ? ? ?Expected Discharge Plan: [place] ?Barriers to Discharge: Continued Medical Work up, SNF Pending bed offer ? ? ?Patient Goals and CMS Choice ?  ?CMS Medicare.gov Compare Post Acute Care list provided to:: Patient ?Choice offered to / list presented to : Patient ? ?Expected Discharge Plan and Services ?Expected Discharge Plan: [place] ?In-house Referral: Clinical Social Work ?  ?Post Acute Care Choice: [place] ?Living arrangements for the past 2 months: [place] ?                ?  ?  ?  ?  ?  ?  ?  ?  ?  ?  ? ?Prior Living Arrangements/Services ?Living arrangements for the past 2 months: [place] ?Lives with:: Adult Children ?Patient language and need for interpreter reviewed:: Yes ?       ?Need for Family Participation in Patient Care: Yes (Comment) ?Care giver support system in place?: Yes (comment) ?Current home services: Other (comment) (none) ?Criminal Activity/Legal Involvement Pertinent to Current Situation/Hospitalization: No - Comment as needed ? ?Activities of Daily Living ?  ?   ? ?Permission Sought/Granted ?Permission sought to share information with : Family Supports ?Permission granted to share information with : Yes, Verbal Permission Granted ? Share Information with NAME: son Jeneen Rinks ? Permission granted to share info w AGENCY: SNF ?   ?   ? ?Emotional Assessment ?Appearance:: Appears stated age ?Attitude/Demeanor/Rapport: Engaged ?Affect (typically observed): Appropriate, Pleasant ?Orientation: : Oriented to Self, Oriented to Place, Oriented to  Time, Oriented to Situation ?Alcohol / Substance Use: Not Applicable ?Psych Involvement: No (comment) ? ?Admission diagnosis:  Acute CHF (congestive heart failure) ([place]) [I50.9] ?NICM (nonischemic cardiomyopathy) ([place]) [I42.8] ?Patient Active Problem List  ? Diagnosis Date Noted  ? Acute CHF (congestive heart failure) ([place]) 03/08/2022  ? Thrombocytopenia ([place]) 10/16/2021  ? Heart failure ([place]) 10/03/2021  ? Stasis dermatitis of both legs 08/05/2021  ? Hand injury, left, initial encounter 09/22/2020  ? CHF exacerbation ([place]) 07/12/2020  ? Hospital discharge follow-up 11/17/2019  ? Onychomycosis 11/17/2019  ? Abdominal wall hernia 11/05/2017  ? Anticoagulated 11/05/2017  ? Rash and nonspecific skin eruption 09/15/2017  ? Ventral hernia without obstruction or gangrene 08/15/2017  ? Chronic venous stasis dermatitis 12/26/2016  ? Urinary tract infection without hematuria   ? Frequent PVCs   ? Hypokalemia   ? Housing problems 11/23/2016  ? Chronic atrial fibrillation ([place]) 11/01/2016  ? Hypotension 10/31/2016  ?  Healthcare maintenance 08/12/2016  ? Heme positive stool 08/09/2016  ? Intertrigo 08/09/2016  ? Chronic anticoagulation 04/26/2016  ? Chronic venous insufficiency   ? Cellulitis 09/07/2015  ? Breast mass, left 10/05/2014  ? Osteopenia 09/15/2014  ? Hypertension 01/04/2014  ? Chronic systolic dysfunction of left ventricle 05/05/2013  ? At high risk for falls 02/11/2013  ? Vitamin D deficiency 07/15/2012  ? Sinoatrial node dysfunction ([place])  05/08/2011  ? Pacemaker 08/10/2009  ? Allergic rhinitis 05/31/2008  ? Morbid obesity ([place]) 02/12/2007  ? GASTROESOPHAGEAL REFLUX, NO ESOPHAGITIS 02/12/2007  ? Female stress incontinence 02/12/2007  ? Seizure disorder ([place]) 02/12/2007  ? OSA (obstructive sleep apnea) 02/12/2007  ? ?PCP:  [name], MD ?Pharmacy:   ?[facility], [place] ?[address] ?[place] [address] ?Phone: [phone] Fax: [phone] ? ?[id number] - [place], [address] [place] [place] [address] [address] ?Phone: [phone] Fax: [phone] ? ? ? ? ?Social Determinants of Health (SDOH) Interventions ?  ? ?Readmission Risk Interventions ?   ? View : No data to display.  ?  ?  ?  ? ? ? ?

## 2022-03-10 NOTE — Progress Notes (Signed)
Mobility Specialist Progress Note: ? ? 03/10/22 1110  ?Mobility  ?Activity Ambulated with assistance in room  ?Level of Assistance Contact guard assist, steadying assist  ?Assistive Device Front wheel walker  ?Distance Ambulated (ft) 20 ft  ?Activity Response Tolerated well  ?$Mobility charge 1 Mobility  ? ?Pt eager for mobility session. Required minG throughout. Ambulated around bed to chair, left sitting up with all needs met. Family member in room. ? ?[place] Numbers ?Acute Rehab ?Phone: [phone] ?Office Phone: [phone] ? ?

## 2022-03-10 NOTE — Progress Notes (Signed)
FPTS Brief Progress Note ? ?S: Patient is sleeping comfortably when I go by the room.  Spoke with nurse who reported no current needs. ? ? ?O: ?BP 107/70 (BP Location: Left Arm)   Pulse 62   Temp 98.2 ?F (36.8 ?C) (Oral)   Resp 19   Ht 4\' 9"  (1.448 m)   Wt 61.7 kg   SpO2 99%   BMI 29.44 kg/m?   ?General: Elderly female resting comfortably in bed ? ?A/P: ?Acute on chronic systolic heart failure ?- Orders reviewed. Labs for AM ordered, which was adjusted as needed.  ?- Closely monitor for worsening of respiratory status ?- Continue medications per team daily progress note ? ?Patient currently not wearing CPAP.  Continues to refuse it.  We will continue to encourage use. ? ? , MD ?03/10/2022, 11:04 PM ?PGY-3, [place] [facility] Night Resident  ?Please page [phone] with questions.  ? ? ?

## 2022-03-10 NOTE — Progress Notes (Signed)
Pt refuses CPAP for tonight.   

## 2022-03-10 NOTE — Progress Notes (Signed)
? ?DAILY PROGRESS NOTE  ? ?[name] ?Date of Encounter: 03/10/2022 ?Cardiologist: None ? ?Chief Complaint  ? ?Breathing much better today ? ?Patient Profile  ? ?71 yo female with chronic afib, s/p PPM, systolic HF (NICM), presents with acute on chronic HF ? ?Subjective  ? ?Very good response to metolazone and lasix - diuresed 2L net negative overnight. Potassium is significantly reduced at 2.2 today. Small increase in creatinine to 1.1. Magnesium 1.8 today. ? ?Objective  ? ?Vitals:  ? 03/09/22 1625 03/09/22 1958 03/09/22 2340 03/10/22 0326  ?BP: 115/89 97/76 (!) 98/58 100/62  ?Pulse: 87 (!) 42 71 (!) 51  ?Resp: (!) 22 19 17 16   ?Temp:  98 ?F (36.7 ?C) 97.8 ?F (36.6 ?C) 97.6 ?F (36.4 ?C)  ?TempSrc: Oral Oral Oral Oral  ?SpO2: 94% 97% 99% 96%  ?Weight:    61.7 kg  ?Height:      ? ? ?Intake/Output Summary (Last 24 hours) at 03/10/2022 03/12/2022 ?Last data filed at 03/10/2022 03/12/2022 ?Gross per 24 hour  ?Intake 1109 ml  ?Output 3150 ml  ?Net -2041 ml  ? ?Filed Weights  ? 03/08/22 1605 03/09/22 0457 03/10/22 0326  ?Weight: 65.1 kg 66 kg 61.7 kg  ? ? ?Physical Exam  ? ?General appearance: alert, no distress, and sitting upright in chair ?Neck: JVD - 3 cm above sternal notch and no carotid bruit ?Lungs: diminished breath sounds bibasilar ?Heart: irregularly irregular rhythm ?Abdomen: soft, non-tender; bowel sounds normal; no masses,  no organomegaly ?Extremities: edema 1+ bilateral, stockings in place ?Pulses: 2+ and symmetric ?Skin: Skin color, texture, turgor normal. No rashes or lesions ?Neurologic: Grossly normal ?Psych: Pleasant, broke into a story about her dead husband when I asked her how she felt today ? ?Inpatient Medications  ?  ?Scheduled Meds: ? amiodarone  200 mg Oral Daily  ? edoxaban  30 mg Oral Daily  ? FLUoxetine  10 mg Oral QHS  ? furosemide  80 mg Intravenous BID  ? mouth rinse  15 mL Mouth Rinse BID  ? metolazone  5 mg Oral Daily  ? PHENobarbital  64.8 mg Oral BID  ? spironolactone  25 mg Oral  Daily  ? ? ?Continuous Infusions: ? magnesium sulfate bolus IVPB    ? potassium chloride 10 mEq (03/10/22 0657)  ? ? ?PRN Meds: ?acetaminophen **OR** acetaminophen  ? ?Labs  ? ?Results for orders placed or performed during the hospital encounter of 03/07/22 (from the past 48 hour(s))  ?CBC     Status: Abnormal  ? Collection Time: 03/09/22  2:27 AM  ?Result Value Ref Range  ? WBC 5.8 4.0 - 10.5 K/uL  ? RBC 4.13 3.87 - 5.11 MIL/uL  ? Hemoglobin 12.6 12.0 - 15.0 g/dL  ? HCT 39.1 36.0 - 46.0 %  ? MCV 94.7 80.0 - 100.0 fL  ? MCH 30.5 26.0 - 34.0 pg  ? MCHC 32.2 30.0 - 36.0 g/dL  ? RDW 14.7 11.5 - 15.5 %  ? Platelets 74 (L) 150 - 400 K/uL  ?  Comment: Immature Platelet Fraction may be ?clinically indicated, consider ?ordering this additional test ?03/11/22 ?CONSISTENT WITH PREVIOUS RESULT ?REPEATED TO VERIFY ?  ? nRBC 0.0 0.0 - 0.2 %  ?  Comment: Performed at [facility], [address] [place], [place]  ?Comprehensive metabolic panel     Status: Abnormal  ? Collection Time: 03/09/22  2:27 AM  ?Result Value Ref Range  ? Sodium 138 135 - 145  mmol/L  ? Potassium 3.7 3.5 - 5.1 mmol/L  ? Chloride 103 98 - 111 mmol/L  ? CO2 29 22 - 32 mmol/L  ? Glucose, Bld 105 (H) 70 - 99 mg/dL  ?  Comment: Glucose reference range applies only to samples taken after fasting for at least 8 hours.  ? BUN 29 (H) 8 - 23 mg/dL  ? Creatinine, Ser 0.95 0.44 - 1.00 mg/dL  ? Calcium 9.3 8.9 - 10.3 mg/dL  ? Total Protein 6.0 (L) 6.5 - 8.1 g/dL  ? Albumin 2.6 (L) 3.5 - 5.0 g/dL  ? AST 20 15 - 41 U/L  ? ALT 27 0 - 44 U/L  ? Alkaline Phosphatase 100 38 - 126 U/L  ? Total Bilirubin 1.2 0.3 - 1.2 mg/dL  ? GFR, Estimated >60 >60 mL/min  ?  Comment: (NOTE) ?Calculated using the CKD-EPI Creatinine Equation (2021) ?  ? Anion gap 6 5 - 15  ?  Comment: Performed at [facility], [address], [place], [place] [address]  ?Magnesium     Status: None  ? Collection Time: 03/09/22  2:27 AM  ?Result Value Ref Range  ? Magnesium 1.7 1.7 - 2.4  mg/dL  ?  Comment: Performed at [facility], [address], [place], [place] [address]  ?CBC     Status: Abnormal  ? Collection Time: 03/10/22  2:58 AM  ?Result Value Ref Range  ? WBC 5.9 4.0 - 10.5 K/uL  ? RBC 4.46 3.87 - 5.11 MIL/uL  ? Hemoglobin 13.5 12.0 - 15.0 g/dL  ? HCT 40.9 36.0 - 46.0 %  ? MCV 91.7 80.0 - 100.0 fL  ? MCH 30.3 26.0 - 34.0 pg  ? MCHC 33.0 30.0 - 36.0 g/dL  ? RDW 14.5 11.5 - 15.5 %  ? Platelets 71 (L) 150 - 400 K/uL  ?  Comment: Immature Platelet Fraction may be ?clinically indicated, consider ?ordering this additional test ?[id number] ?  ? nRBC 0.0 0.0 - 0.2 %  ?  Comment: Performed at [facility], [address] [place], [place] [address]  ?Comprehensive metabolic panel     Status: Abnormal  ? Collection Time: 03/10/22  2:58 AM  ?Result Value Ref Range  ? Sodium 134 (L) 135 - 145 mmol/L  ? Potassium 2.2 (LL) 3.5 - 5.1 mmol/L  ?  Comment: REPEATED TO VERIFY ?CRITICAL RESULT CALLED TO, READ BACK BY AND VERIFIED WITH: ?Yetta Barre F,RN 03/10/22 0354 WAYK ?  ? Chloride 88 (L) 98 - 111 mmol/L  ? CO2 38 (H) 22 - 32 mmol/L  ? Glucose, Bld 102 (H) 70 - 99 mg/dL  ?  Comment: Glucose reference range applies only to samples taken after fasting for at least 8 hours.  ? BUN 31 (H) 8 - 23 mg/dL  ? Creatinine, Ser 1.10 (H) 0.44 - 1.00 mg/dL  ? Calcium 9.2 8.9 - 10.3 mg/dL  ? Total Protein 6.5 6.5 - 8.1 g/dL  ? Albumin 2.8 (L) 3.5 - 5.0 g/dL  ? AST 19 15 - 41 U/L  ? ALT 26 0 - 44 U/L  ? Alkaline Phosphatase 104 38 - 126 U/L  ? Total Bilirubin 1.4 (H) 0.3 - 1.2 mg/dL  ? GFR, Estimated 53 (L) >60 mL/min  ?  Comment: (NOTE) ?Calculated using the CKD-EPI Creatinine Equation (2021) ?  ? Anion gap 8 5 - 15  ?  Comment: Performed at [facility], [address] [place], [place] [address]  ?  Magnesium     Status: None  ? Collection Time: 03/10/22  2:58 AM  ?Result Value Ref Range  ? Magnesium 1.8 1.7 - 2.4 mg/dL  ?  Comment: Performed at [facility], [address] [place], [place] [address]   ? ?*Note: Due to a large number of results and/or encounters for the requested time period, some results have not been displayed. A complete set of results can be found in Results Review.  ? ? ?ECG  ? ?Afib with slow ventricular response - Personally Reviewed ? ?Telemetry  ? ?N/A ? ?Radiology  ?  ?CT HEAD WO CONTRAST ( ) ? ?Result Date: 03/09/2022 ?CLINICAL DATA:  Acute change in speech EXAM: CT HEAD WITHOUT CONTRAST TECHNIQUE: Contiguous axial images were obtained from the base of the skull through the vertex without intravenous contrast. RADIATION DOSE REDUCTION: This exam was performed according to the departmental dose-optimization program which includes automated exposure control, adjustment of the mA and/or kV according to patient size and/or use of iterative reconstruction technique. COMPARISON:  CT head dated 02/23/2022. FINDINGS: Brain: No evidence of acute infarction, hemorrhage, hydrocephalus, extra-axial collection or mass lesion/mass effect. Encephalomalacia involving the right frontal and right parietal lobes is redemonstrated. There is mild cerebral volume loss with associated ex vacuo dilatation. Periventricular white matter hypoattenuation likely represents chronic small vessel ischemic disease. Vascular: There are vascular calcifications in the carotid siphons. Skull: Normal. Negative for fracture or focal lesion. Sinuses/Orbits: No acute finding. Other: None. IMPRESSION: No acute intracranial process. Electronically Signed   By: Romona Curls M.D.   On: 03/09/2022 15:18  ? ?ECHOCARDIOGRAM COMPLETE ? ?Result Date: 03/08/2022 ?   ECHOCARDIOGRAM REPORT   Patient Name:   AZARRIA YTURRALDE [facility] Date of Exam: 03/08/2022 Medical Rec #:  [id number]     Height:       57.0 in Accession #:    [id number]    Weight:       147.3 lb Date of Birth:  08/04/1951     BSA:          1.579 m? Patient Age:    70 years      BP:           112/80 mmHg Patient Gender: F             HR:           74 bpm. Exam Location:  Inpatient  Procedure: 2D Echo, Cardiac Doppler, Color Doppler and Intracardiac            Opacification Agent Indications:    CHF  History:        Patient has prior history of Echocardiogram examinations. CHF,

## 2022-03-11 DIAGNOSIS — I5021 Acute systolic (congestive) heart failure: Secondary | ICD-10-CM | POA: Diagnosis not present

## 2022-03-11 LAB — MAGNESIUM: Magnesium: 1.9 mg/dL (ref 1.7–2.4)

## 2022-03-11 LAB — COMPREHENSIVE METABOLIC PANEL
ALT: 23 U/L (ref 0–44)
AST: 22 U/L (ref 15–41)
Albumin: 2.9 g/dL — ABNORMAL LOW (ref 3.5–5.0)
Alkaline Phosphatase: 106 U/L (ref 38–126)
Anion gap: 11 (ref 5–15)
BUN: 30 mg/dL — ABNORMAL HIGH (ref 8–23)
CO2: 42 mmol/L — ABNORMAL HIGH (ref 22–32)
Calcium: 9.4 mg/dL (ref 8.9–10.3)
Chloride: 82 mmol/L — ABNORMAL LOW (ref 98–111)
Creatinine, Ser: 0.91 mg/dL (ref 0.44–1.00)
GFR, Estimated: >60 mL/min (ref >60)
Glucose, Bld: 100 mg/dL — ABNORMAL HIGH (ref 70–99)
Potassium: 2.6 mmol/L — CL (ref 3.5–5.1)
Sodium: 135 mmol/L (ref 135–145)
Total Bilirubin: 1.3 mg/dL — ABNORMAL HIGH (ref 0.3–1.2)
Total Protein: 6.9 g/dL (ref 6.5–8.1)

## 2022-03-11 LAB — BASIC METABOLIC PANEL
Anion gap: 8 (ref 5–15)
Anion gap: 8 (ref 5–15)
BUN: 32 mg/dL — ABNORMAL HIGH (ref 8–23)
BUN: 38 mg/dL — ABNORMAL HIGH (ref 8–23)
CO2: 42 mmol/L — ABNORMAL HIGH (ref 22–32)
CO2: 40 mmol/L — ABNORMAL HIGH (ref 22–32)
Calcium: 9.3 mg/dL (ref 8.9–10.3)
Calcium: 9.1 mg/dL (ref 8.9–10.3)
Chloride: 82 mmol/L — ABNORMAL LOW (ref 98–111)
Chloride: 83 mmol/L — ABNORMAL LOW (ref 98–111)
Creatinine, Ser: 0.99 mg/dL (ref 0.44–1.00)
Creatinine, Ser: 1.2 mg/dL — ABNORMAL HIGH (ref 0.44–1.00)
GFR, Estimated: >60 mL/min (ref >60)
GFR, Estimated: 48 mL/min — ABNORMAL LOW (ref >60)
Glucose, Bld: 104 mg/dL — ABNORMAL HIGH (ref 70–99)
Glucose, Bld: 106 mg/dL — ABNORMAL HIGH (ref 70–99)
Potassium: 3.1 mmol/L — ABNORMAL LOW (ref 3.5–5.1)
Potassium: 4 mmol/L (ref 3.5–5.1)
Sodium: 132 mmol/L — ABNORMAL LOW (ref 135–145)
Sodium: 131 mmol/L — ABNORMAL LOW (ref 135–145)

## 2022-03-11 LAB — CBC
HCT: 43.7 % (ref 36.0–46.0)
Hemoglobin: 14.7 g/dL (ref 12.0–15.0)
MCH: 30.3 pg (ref 26.0–34.0)
MCHC: 33.6 g/dL (ref 30.0–36.0)
MCV: 90.1 fL (ref 80.0–100.0)
Platelets: 79 10*3/uL — ABNORMAL LOW (ref 150–400)
RBC: 4.85 MIL/uL (ref 3.87–5.11)
RDW: 14.3 % (ref 11.5–15.5)
WBC: 6.7 10*3/uL (ref 4.0–10.5)
nRBC: 0 % (ref 0.0–0.2)

## 2022-03-11 LAB — LACTIC ACID, PLASMA: Lactic Acid, Venous: 1.2 mmol/L (ref 0.5–1.9)

## 2022-03-11 MED ORDER — POTASSIUM CHLORIDE 20 MEQ PO PACK
40.0000 meq | PACK | ORAL | Status: AC
  Administered 2022-03-11 (×2): 40 meq via ORAL
  Filled 2022-03-11 (×2): qty 2

## 2022-03-11 MED ORDER — MAGNESIUM SULFATE IN D5W 1-5 GM/100ML-% IV SOLN
1.0000 g | Freq: Once | INTRAVENOUS | Status: DC
  Filled 2022-03-11: qty 100

## 2022-03-11 MED ORDER — MAGNESIUM SULFATE 2 GM/50ML IV SOLN
2.0000 g | Freq: Once | INTRAVENOUS | Status: AC
  Administered 2022-03-11: 2 g via INTRAVENOUS
  Filled 2022-03-11: qty 50

## 2022-03-11 MED ORDER — POTASSIUM CHLORIDE CRYS ER 20 MEQ PO TBCR
40.0000 meq | EXTENDED_RELEASE_TABLET | Freq: Once | ORAL | Status: DC
  Administered 2022-03-11: 40 meq via ORAL

## 2022-03-11 MED ORDER — POTASSIUM CHLORIDE 10 MEQ/100ML IV SOLN: 10.0000 meq | INTRAVENOUS | Status: DC

## 2022-03-11 MED ORDER — LOSARTAN POTASSIUM 25 MG PO TABS
12.5000 mg | ORAL_TABLET | Freq: Every day | ORAL | Status: DC
Start: 2022-03-11 — End: 2022-03-12
  Administered 2022-03-11 – 2022-03-12 (×2): 12.5 mg via ORAL
  Filled 2022-03-11 (×2): qty 1

## 2022-03-11 MED ORDER — POTASSIUM CHLORIDE CRYS ER 20 MEQ PO TBCR
40.0000 meq | EXTENDED_RELEASE_TABLET | Freq: Once | ORAL | Status: AC
  Administered 2022-03-11: 40 meq via ORAL
  Filled 2022-03-11: qty 2

## 2022-03-11 MED ORDER — TORSEMIDE 20 MG PO TABS
60.0000 mg | ORAL_TABLET | Freq: Two times a day (BID) | ORAL | Status: DC
  Administered 2022-03-11 – 2022-03-13 (×4): 60 mg via ORAL
  Filled 2022-03-11 (×4): qty 3

## 2022-03-11 MED ORDER — POTASSIUM CHLORIDE 10 MEQ/100ML IV SOLN
10.0000 meq | INTRAVENOUS | Status: AC
  Administered 2022-03-11 (×2): 10 meq via INTRAVENOUS
  Filled 2022-03-11 (×2): qty 100

## 2022-03-11 MED ORDER — POTASSIUM CHLORIDE 20 MEQ PO PACK: 40.0000 meq | PACK | Freq: Two times a day (BID) | ORAL | Status: DC

## 2022-03-11 MED ORDER — AMIODARONE HCL 200 MG PO TABS
200.0000 mg | ORAL_TABLET | Freq: Two times a day (BID) | ORAL | Status: DC
  Administered 2022-03-11 – 2022-03-14 (×7): 200 mg via ORAL
  Filled 2022-03-11 (×7): qty 1

## 2022-03-11 MED ORDER — POTASSIUM CHLORIDE CRYS ER 20 MEQ PO TBCR
40.0000 meq | EXTENDED_RELEASE_TABLET | Freq: Once | ORAL | Status: AC
Start: 2022-03-11 — End: 2022-03-11
  Filled 2022-03-11: qty 2

## 2022-03-11 MED ORDER — POTASSIUM CHLORIDE CRYS ER 20 MEQ PO TBCR: 60.0000 meq | EXTENDED_RELEASE_TABLET | Freq: Once | ORAL | Status: DC

## 2022-03-11 NOTE — Progress Notes (Signed)
Mobility Specialist Progress Note: ? ? 03/11/22 1118  ?Mobility  ?Activity Ambulated with assistance in room  ?Level of Assistance Contact guard assist, steadying assist  ?Assistive Device Front wheel walker  ?Distance Ambulated (ft) 20 ft  ?Activity Response Tolerated well  ?$Mobility charge 1 Mobility  ? ?Pt eager for mobility session. Required minG throughout ambulation with RW. Pt left sitting up in chair with chair alarm on.  ? ?Nelta Numbers ?Acute Rehab ?Phone: [phone] ?Office Phone: [phone] ? ?

## 2022-03-11 NOTE — Progress Notes (Signed)
Pt refused CPAP tonight.

## 2022-03-11 NOTE — Progress Notes (Signed)
Physical Therapy Treatment ?Patient Details ?Name: Misty Gray ?MRN: [id number] ?DOB: October 27, 1951 ?Today's Date: 03/11/2022 ? ? ?History of Present Illness 71 yo female admitted 3/23, presents with acute on chronic HF.  OIN:[facility] afib, s/p PPM, systolic HF (NICM) ? ?  ?PT Comments  ? ? The pt was agreeable to session with focus on progressing OOB mobility. The pt was able to complete multiple short bouts of ambulation in the room followed by longer bout after seated rest. She continues to demo poor power in BLE and is dependent on increased time to rise to standing and is dependent on UE support to rise to standing. The pt was able to maintain VSS on 2L, but SpO2 dropped to 86% when pt removed O2 during initial ambulation. The pt will continue to benefit from skilled PT acutely to progress mobility and endurance. Continue to recommend frequent/constant supervision at d/c given current mobility.  ?   ?Recommendations for follow up therapy are one component of a multi-disciplinary discharge planning process, led by the attending physician.  Recommendations may be updated based on patient status, additional functional criteria and insurance authorization. ? ?Follow Up Recommendations ? Skilled nursing-short term rehab (<3 hours/day) (unless pt has 24 hour care.  If she has 24 hour care, can go home with HHPT) ?  ?  ?Assistance Recommended at Discharge Frequent or constant Supervision/Assistance  ?Patient can return home with the following A little help with bathing/dressing/bathroom;Help with stairs or ramp for entrance;A little help with walking and/or transfers;Assistance with cooking/housework ?  ?Equipment Recommendations ? None recommended by PT  ?  ?Recommendations for Other Services   ? ? ?  ?Precautions / Restrictions Precautions ?Precautions: Fall ?Precaution Comments: on 2L O2 ?Restrictions ?Weight Bearing Restrictions: No  ?  ? ?Mobility ? Bed Mobility ?Overal bed mobility: Needs Assistance ?Bed Mobility:  Supine to Sit, Sit to Supine ?  ?  ?Supine to sit: Min guard ?Sit to supine: Min guard ?  ?General bed mobility comments: pt able to complete without physical assist. increased time ?  ? ?Transfers ?Overall transfer level: Needs assistance ?Equipment used: Rolling walker (2 wheels) ?Transfers: Sit to/from Stand ?Sit to Stand: Min guard ?  ?  ?  ?  ?  ?General transfer comment: minG to power up with BUE support. unable to power up without UE support. x4 in session ?  ? ?Ambulation/Gait ?Ambulation/Gait assistance: Min assist ?Gait Distance (Feet): 8 Feet (+ 8 ft + 20 ft) ?Assistive device: Rolling walker (2 wheels) ?Gait Pattern/deviations: Step-through pattern, Decreased stride length, Trunk flexed, Wide base of support, Drifts right/left, Leaning posteriorly, Knee flexed in stance - right, Knee flexed in stance - left ?Gait velocity: slowed ?Gait velocity interpretation: <1.31 ft/sec, indicative of household ambulator ?  ?General Gait Details: pt with slow but steady gait with significant head and trunk flexion. no overt LOB but cues for posture. ? ? ? ?  ?Balance Overall balance assessment: Needs assistance ?Sitting-balance support: No upper extremity supported, Feet supported ?Sitting balance-Leahy Scale: Fair ?Sitting balance - Comments: can sit EOB on her own without UE support or assist ?Postural control: Posterior lean ?Standing balance support: Bilateral upper extremity supported, During functional activity ?Standing balance-Leahy Scale: Poor ?Standing balance comment: BUE support on RW for gait ?  ?  ?  ?  ?  ?  ?  ?  ?  ?  ?  ?  ? ?  ?Cognition Arousal/Alertness: Awake/alert ?Behavior During Therapy: [facility] for tasks assessed/performed ?Overall Cognitive Status:  Within Functional Limits for tasks assessed ?  ?  ?  ?  ?  ?  ?  ?  ?  ?  ?  ?  ?  ?  ?  ?  ?General Comments: pt following all cues, but very difficult to understand ?  ?  ? ?  ?Exercises   ? ?  ?General Comments General comments (skin integrity,  edema, etc.): pt removing O2 for mobility, SpO2 to low of 86% on RA. improved to 93% on 2L ?  ?  ? ?Pertinent Vitals/Pain Pain Assessment ?Pain Assessment: No/denies pain  ? ? ? ?PT Goals (current goals can now be found in the care plan section) Acute Rehab PT Goals ?Patient Stated Goal: to get better ?PT Goal Formulation: With patient ?Time For Goal Achievement: 03/23/22 ?Potential to Achieve Goals: Good ?Progress towards PT goals: Progressing toward goals ? ?  ?Frequency ? ? ? Min 3X/week ? ? ? ?  ?PT Plan Current plan remains appropriate  ? ? ?   ?AM-PAC PT "6 Clicks" Mobility   ?Outcome Measure ? Help needed turning from your back to your side while in a flat bed without using bedrails?: A Little ?Help needed moving from lying on your back to sitting on the side of a flat bed without using bedrails?: A Little ?Help needed moving to and from a bed to a chair (including a wheelchair)?: A Little ?Help needed standing up from a chair using your arms (e.g., wheelchair or bedside chair)?: A Little ?Help needed to walk in hospital room?: A Lot ?Help needed climbing 3-5 steps with a railing? : A Lot ?6 Click Score: 16 ? ?  ?End of Session Equipment Utilized During Treatment: Gait belt;Oxygen ?Activity Tolerance: Patient tolerated treatment well;Patient limited by fatigue ?Patient left: in bed;with call bell/phone within reach;with family/visitor present ?Nurse Communication: Mobility status ?PT Visit Diagnosis: Unsteadiness on feet (R26.81);Muscle weakness (generalized) (M62.81) ?  ? ? ?Time: 8828-0034 ?PT Time Calculation (min) (ACUTE ONLY): 17 min ? ?Charges:  $Gait Training: 8-22 mins          ?          ? ?Vickki Muff, PT, DPT  ? ?Acute Rehabilitation Department ?Pager #: [phone] ? ? ?Ronnie Derby ?03/11/2022, 4:20 PM ? ?

## 2022-03-11 NOTE — Progress Notes (Signed)
Family Medicine Teaching Service ?Daily Progress Note ?Intern Pager: [phone] ? ?[name] Medical record number: [id number] ?Date of birth: [birth year] Age: 71 y.o. Gender: female ? ?Primary Care Provider: Latrelle Dodrill, MD ?Consultants: Cardiology ?Code Status: DNR ? ?Pt Overview and Major Events to Date:  ?3/24: Admitted ? ?Assessment and Plan: ?Misty Gray is a 71 y.o. female presenting with worsening shortness of breath, found to have acute systolic heart failure PMH significant for HFrEF, A-fib, COPD, OSA, history of seizure, sick sinus syndrome with pacemaker, nonischemic cardiomyopathy ? ?Acute on chronic systolic CHF exacerbation ?5.4 L output in the last 24 hours, has diuresed well with the addition of metolazone in the last 2 days. Appears euvolemic at this time. ?-Cardiology following, appreciate recommendations ?-Stop IV Lasix and metolazone, start torsemide 60mg  BID ?-Continue spironolactone 25 mg daily ?-Restart losartan 12.5mg  daily ?-Daily weights, strict I's and O's ?-PT/OT eval and treat ?-wean O2 to baseline 2L as able ? ?Electrolyte management ?Goal K greater than 4 and mag greater than 2 due to AFib.  K 2.6, Mag 1.9.  ?-Kcl 40 mEq once, IV K 10 mEq x 2, followed by Kcl 40 mEq @1200  ?-Recheck BMP @1600  ?-IV Mag 1g ?-Recheck AM Mag, BMP ? ?Afib  Sick sinus syndrome w/ pacemaker: rate-controlled, stable ?Rate controlled in the 70s. Cardiology notes patient has high PVC burden which requires increased amiodarone dosage.  ?-continued edoxaban  ?-Amiodarone adjusted to 200mg  BID ?-discharge dosage of amiodarone to be 200mg  daily ?-2 wk zio monitor at discharge ? ?Thrombocytopenia: Chronic, stable ?Platelets 79. ?-Hold anticoagulation if platelets less than 50 ?-A.m. CBC ? ?Acute grief reaction ?Visited by chaplain on 3/25 due to losing her husband approximately 1 week ago. ?-Continue Prozac 10 mg daily ? ?FEN/GI: Heart healthy ?PPx: Edoxaban ?Dispo:SNF today. Stable for  discharge. ? ?Subjective:  ?Pt was appreciative of chaplain visiting on 3/25. Enjoying her breakfast this morning. Aware of goal for SNF. ? ?Objective: ?Temp:  [97.6 ?F (36.4 ?C)-98.2 ?F (36.8 ?C)] 97.6 ?F (36.4 ?C) (03/27 0421) ?Pulse Rate:  [62-63] 63 (03/27 0421) ?Resp:  [19-20] 20 (03/27 0421) ?BP: (100-107)/(69-74) 100/69 (03/27 0421) ?SpO2:  [93 %-99 %] 93 % (03/27 0421) ?Weight:  [57.7 kg] 57.7 kg (03/27 0421) ?Physical Exam: ?General: awake ,alert, NAD ?Cardiovascular: irregularly irregular, no murmurs auscultated ?Respiratory: CTAB, no increased work of breathing, 3L [place] in place ?Extremities: no pitting edema present ? ?Laboratory: ?Recent Labs  ?Lab 03/09/22 ?0227 03/10/22 ?0258 03/11/22 ?0433  ?WBC 5.8 5.9 6.7  ?HGB 12.6 13.5 14.7  ?HCT 39.1 40.9 43.7  ?PLT 74* 71* 79*  ? ?Recent Labs  ?Lab 03/09/22 ?0227 03/10/22 ?0258 03/10/22 ?1038 03/11/22 ?0433  ?NA 138 134* 134* 135  ?K 3.7 2.2* 4.3 2.6*  ?CL 103 88* 88* 82*  ?CO2 29 38* 36* 42*  ?BUN 29* 31* 27* 30*  ?CREATININE 0.95 1.10* 0.99 0.91  ?CALCIUM 9.3 9.2 9.4 9.4  ?PROT 6.0* 6.5  --  6.9  ?BILITOT 1.2 1.4*  --  1.3*  ?ALKPHOS 100 104  --  106  ?ALT 27 26  --  23  ?AST 20 19  --  22  ?GLUCOSE 105* 102* 143* 100*  ? ?Mag: 1.9 ?Phenobarbital: 20 ug/mL ? ?Imaging/Diagnostic Tests: ?No results found. ? ?03/13/22, DO ?03/11/2022, 7:31 AM ?PGY-1, [facility] ?FPTS Intern pager: [phone], text pages welcome ? ?

## 2022-03-11 NOTE — Plan of Care (Signed)
  Problem: Education: Goal: Knowledge of General Education information will improve Description: Including pain rating scale, medication(s)/side effects and non-pharmacologic comfort measures Outcome: Progressing   Problem: Clinical Measurements: Goal: Ability to maintain clinical measurements within normal limits will improve Outcome: Progressing   Problem: Clinical Measurements: Goal: Diagnostic test results will improve Outcome: Progressing   

## 2022-03-11 NOTE — Consult Note (Signed)
? ?  [facility] CM Inpatient Consult ? ? ?03/11/2022 ? ?[name] ?November 15, 1951 ?[id number] ? ?[facility] [[facility]] Patient: [employer] ? ?Primary Care Provider:  [name], MD, is an embedded provider with a Chronic Care Management team and program, and is listed for the transition of care follow up and appointments. ? ?Patient was screened for Embedded practice service needs for chronic care management shows that the patient has been active with the Embedded RNCM in the CCM program. Patient also shows patient active with para medicine. Patient is currently being recommended for a skilled nursing facility level of care. ? ?Plan: Continue to follow up. If patient transitions to SNF then needs are to be met at that level of care. ? ?Please contact for further questions, ? ?Natividad Brood, RN BSN CCM ?[facility] Liaison ? [phone] business mobile phone ?Toll free office [phone]  ?Fax number: [phone] ?[email or web address] ? ? ? ? ? ?

## 2022-03-11 NOTE — Progress Notes (Signed)
?   03/11/22 1320  ?Clinical Encounter Type  ?Visited With Patient and family together ?(Rasheka Loveless [phone] and niece)  ?Visit Type Initial  ?Referral From Nurse;Other (Comment) ?Yvone Neu)  ?Consult/Referral To Chaplain  ? ?Paged to meet with patient's son and niece regarding Notary Services for patient's deceased husband. Advised hospital Notary not available for that type of service and that crematory should provide Notary to assist them. ?

## 2022-03-11 NOTE — Progress Notes (Addendum)
? ? Advanced Heart Failure Rounding Note ? ?PCP-Cardiologist: None  ? ?Subjective:   ? Over the weekend diuresed with IV lasix. Had metolazone yesterday. Brisk diuresis. Weight down another 7 pounds.  ? ?Complaining of trouble sleeping.  ? ?Objective:   ?Weight Range: ?57.7 kg ?Body mass index is 27.53 kg/m?.  ? ?Vital Signs:   ?Temp:  [97.6 ?F (36.4 ?C)-98.2 ?F (36.8 ?C)] 97.6 ?F (36.4 ?C) (03/27 0421) ?Pulse Rate:  [62-63] 63 (03/27 0421) ?Resp:  [19-20] 20 (03/27 0421) ?BP: (100-107)/(69-74) 100/69 (03/27 0421) ?SpO2:  [93 %-99 %] 93 % (03/27 0421) ?Weight:  [57.7 kg] 57.7 kg (03/27 0421) ?Last BM Date : 03/10/22 ? ?Weight change: ?Filed Weights  ? 03/09/22 0457 03/10/22 0326 03/11/22 0421  ?Weight: 66 kg 61.7 kg 57.7 kg  ? ? ?Intake/Output:  ? ?Intake/Output Summary (Last 24 hours) at 03/11/2022 0746 ?Last data filed at 03/11/2022 0423 ?Gross per 24 hour  ?Intake 520 ml  ?Output 5450 ml  ?Net -4930 ml  ?  ? ? ?Physical Exam  ? General:  In bed.  No resp difficulty ?HEENT: Normal ?Neck: Supple. JVP .5-6  Carotids 2+ bilat; no bruits. No lymphadenopathy or thyromegaly appreciated. ?Cor: PMI nondisplaced. Irregular rate & rhythm. No rubs, gallops or murmurs. ?Lungs: Clear ?Abdomen: Soft, nontender, nondistended. No hepatosplenomegaly. No bruits or masses. Good bowel sounds. ?Extremities: No cyanosis, clubbing, rash, edema ?Neuro: Alert & orientedx3, cranial nerves grossly intact. moves all 4 extremities w/o difficulty. Affect pleasant ? ? ?Telemetry  ? ?A fib  High PVC burden > 11-30 per hour ? ?EKG  ? N/A ? ?Labs  ?  ?CBC ?Recent Labs  ?  03/10/22 ?0258 03/11/22 ?0433  ?WBC 5.9 6.7  ?HGB 13.5 14.7  ?HCT 40.9 43.7  ?MCV 91.7 90.1  ?PLT 71* 79*  ? ?Basic Metabolic Panel ?Recent Labs  ?  03/10/22 ?0258 03/10/22 ?1038 03/11/22 ?0433  ?NA 134* 134* 135  ?K 2.2* 4.3 2.6*  ?CL 88* 88* 82*  ?CO2 38* 36* 42*  ?GLUCOSE 102* 143* 100*  ?BUN 31* 27* 30*  ?CREATININE 1.10* 0.99 0.91  ?CALCIUM 9.2 9.4 9.4  ?MG 1.8  --  1.9   ? ?Liver Function Tests ?Recent Labs  ?  03/10/22 ?0258 03/11/22 ?0433  ?AST 19 22  ?ALT 26 23  ?ALKPHOS 104 106  ?BILITOT 1.4* 1.3*  ?PROT 6.5 6.9  ?ALBUMIN 2.8* 2.9*  ? ?No results for input(s): LIPASE, AMYLASE in the last 72 hours. ?Cardiac Enzymes ?No results for input(s): CKTOTAL, CKMB, CKMBINDEX, TROPONINI in the last 72 hours. ? ?BNP: ?BNP (last 3 results) ?Recent Labs  ?  05/29/21 ?1443 10/02/21 ?1410 03/08/22 ?0010  ?BNP 561.9* 986.5* 1,902.5*  ? ? ?ProBNP (last 3 results) ?No results for input(s): PROBNP in the last 8760 hours. ? ? ?D-Dimer ?No results for input(s): DDIMER in the last 72 hours. ?Hemoglobin A1C ?No results for input(s): HGBA1C in the last 72 hours. ?Fasting Lipid Panel ?No results for input(s): CHOL, HDL, LDLCALC, TRIG, CHOLHDL, LDLDIRECT in the last 72 hours. ?Thyroid Function Tests ?No results for input(s): TSH, T4TOTAL, T3FREE, THYROIDAB in the last 72 hours. ? ?Invalid input(s): FREET3 ? ?Other results: ? ? ?Imaging  ? ? ?No results found. ? ? ?Medications:   ? ? ?Scheduled Medications: ? amiodarone  200 mg Oral Daily  ? edoxaban  30 mg Oral Daily  ? FLUoxetine  10 mg Oral QHS  ? furosemide  80 mg Intravenous BID  ? mouth rinse  15  mL Mouth Rinse BID  ? metolazone  2.5 mg Oral Daily  ? PHENobarbital  64.8 mg Oral BID  ? potassium chloride  40 mEq Oral Once  ? potassium chloride  60 mEq Oral Once  ? spironolactone  25 mg Oral Daily  ? ? ?Infusions: ? magnesium sulfate bolus IVPB    ? ? ?PRN Medications: ?acetaminophen **OR** acetaminophen ? ? ? ?Patient Profile  ? ?71 y.o. female with history of chronic systolic HF/NICM, chronic AF with tachy-brady syndrome s/p PPM, PVCs, obesity, OSA, seizure disorder. Now admitted with a/c CHF. ? ?Assessment/Plan  ? ?1. Acute on chronic Biventricular HF due to NICM:  ?- ? PVC induced vs RV pacing.  ?- Echo 12/06/2016 EF 10-15%.  ?- Cath 10/17 normal coronary arteries.   ?- Echo 06/2020 EF 25-30%, RV moderately reduced.  ?- Echo 10/22 EF 20-25% RV  moderately reduced ?- NYHA IIIb. Appears volume overloaded on exam. BNP 1,902 (986 10/22). Lactic acid okay 1.1 and CO2 23. ?- CXR consistent with CHF and bilateral pleural effusions ?- Has not taken home medications in several days. Stressed with recent loss of her husband.  ?- Volume status improved. Stop IV lasix and metolazone. Replace K. Check BMET this afternoon ?- Start torsemide 60 mg twice a day with first dose this evening.  ?- Continue 25 mg spironolactone.  ?- Hold  losartan for now with soft BP ?- Digoxin was stopped due to elevated level.    ?- Would not use SGLT2i given high risk of genital yeast infection ?- Intolerant to ? blockers. ?- Has refused ICD upgrade. Reports recent seizure activity prompting ED visit. States she felt very dizzy and thought she may pass out resulting in facial injury. ? If Ventricular arrhytmias actually triggered symptoms.  ?- Recheck echo  ?- Suspect some of her LE edema d/t venous stasis. ?  ?2. Chest heaviness: ?-HS troponin minimally elevated 55>47, doubt ACS ?-Normal Cors on cath in 2017 ?-Suspect symptoms may be d/t a/c CHF ?- No chest pain ?  ?3. Chronic AF with tachy-brady s/p pacemaker ?- Intolerant to ? blockers. Rate controlled.  ?- Increase amio 200 mg twice a day with frequent ectopy/PVCs.  ?- Continue endoxaban. No bleeding ?- Followed by Dr. Rayann Heman  ?- At time of gen change 7/22. < 20% RV bacing and QRS < 130 so felt not to be candidate for CRT upgrade. Patient did not want ICD.  ?  ?4. Frequent PVCs/VT ?- EF did not improve with PVC suppression  ?- PVCs on ECG in ED. Unable to get PVC burden off PPM given VVI settings. ?- High PVC burden. 10-30 per hour. Increase amio to 200 mg twice a day.  ?- Replace K. Replace Mag ?  ?5. Seizure disorder ?- On phenobarbital ?- Level 20.  ?- Per primary team.  ?  ?6. Grief ?- Lost husband about a week ago ?  ?7. Venous stasis dermatitis ?- Chronic problem ?  ?8. Hypokalemia ?K 2.6 Aggressively being   replaced ? ?9.GOC ?DNR. ?  ?SDOH:  ?-Has paramedicine in the community ?SW following. FL2 for SNF.  PT/OT recommending SNF ? ? ?Length of Stay: 3 ? ?Darrick Grinder, NP  ?03/11/2022, 7:46 AM ? ?Advanced Heart Failure Team ?Pager [phone] (M-F; 7a - 5p)  ?Please contact [facility] for night-coverage after hours (5p -7a ) and weekends on [email or web address] ? ?Patient seen with NP, agree with the above note.  ? ?She has diuresed well, weight down a total of about  20 lbs.   Breathing better.  Creatinine stable 0.91.  ? ?She continues to be in atrial fibrillation (permanent) with occasional PVCs.  Short NSVT yesterday.  ? ?General: NAD ?Neck: No JVD, no thyromegaly or thyroid nodule.  ?Lungs: Clear to auscultation bilaterally with normal respiratory effort. ?CV: Nondisplaced PMI.  Heart irregular S1/S2, no S3/S4, 2/6 HSM apex.  No peripheral edema.   ?Abdomen: Soft, nontender, no hepatosplenomegaly, no distention.  ?Skin: Intact without lesions or rashes.  ?Neurologic: Alert and oriented x 3.  ?Psych: Normal affect. ?Extremities: No clubbing or cyanosis.  ?HEENT: Normal.  ? ?Echo this admission similar to prior with EF 20-25%, moderate RV dysfunction, mod-severe MR.  ? ?Agree with transition to torsemide 60 mg bid today.  Replace K aggressively.  Continue spironolactone, can restart losartan at 12.5 mg daily.  ? ?No sustained VT here, 1 episode NSVT x 5 beats.  Uncertain of cause of presyncope prior to admission but cannot rule out ventricular arrhythmia.  She has refused ICD.   ?- Would have her wear 2 wk Zio monitor at discharge for longer period of monitoring.  ?- On amiodarone 100 mg daily at home, increased to 200 mg bid here and would discharge on 200 mg daily.  ? ?SNF recommended, think she can go when bed is available.  ? ?Loralie Champagne ?03/11/2022 ?8:44 AM ? ?

## 2022-03-11 NOTE — Progress Notes (Signed)
Pt. BP 56/45 automatically, 58/42 manual pm R arm. Pt. Alert and responsive, resting in bed. No distress noted. On call for Cardiology paged to make aware.  ?

## 2022-03-11 NOTE — TOC Progression Note (Addendum)
Transition of Care (TOC) - Progression Note  ? ? ?Patient Details  ?Name: Misty Gray ?MRN: [id number] ?Date of Birth: Jul 07, 1951 ? ?Transition of Care (TOC) CM/SW Contact  ?Vesna Kable, LCSW ?Phone Number: ?03/11/2022, 2:32 PM ? ?Clinical Narrative:    ?HF CSW spoke with Mrs. Dellis at bedside along with her niece and offered choice for rehab facilities. The patient's niece called Ms. Altmann's son, Freida Busman [phone] and he reported preference for [facility].  ?2:28pm - HF CSW called Tresa Endo at [facility] and reported that she doesn't have a bed available until Friday. ?2:33pm - HF CSW called the patient's son Freida Busman [phone] and he reported preference for the SNF closest to him and to try [facility]. ?2:39pm - HF CSW reached out to [place] and had to leave a voicemail for her to return the call. 2:45pm - HF CSW called the main number at [facility] and was able to get in touch with Inetta Fermo who reported that they don't take [facility] and cannot extend a bed offer. ?2:53pm- HF CSW called the patient's son, [name] [phone] however his phone went to voicemail and CSW had to leave a voicemail about the next closest option would be [facility] to their home address. ?2:54pm - HF CSW reached out to Star at [facility] to see if she has a bed available and Star reported that she does and will start insurance authorization for Ms. Draeger. ? ?CSW will continue to follow throughout discharge. ? ? ?Expected Discharge Plan: Skilled Nursing Facility ?Barriers to Discharge: Continued Medical Work up ? ?Expected Discharge Plan and Services ?Expected Discharge Plan: Skilled Nursing Facility ?In-house Referral: Clinical Social Work ?Discharge Planning Services: CM Consult ?Post Acute Care Choice: Skilled Nursing Facility ?Living arrangements for the past 2 months: Single Family Home ?                ?  ?  ?  ?  ?  ?  ?  ?  ?  ?  ? ? ?Social Determinants of Health (SDOH) Interventions ?  ? ?Readmission Risk Interventions ?    ? View : No data to display.  ?  ?  ?  ? ? ?Kiarrah Rausch, MSW, LCSW ?[phone] ?Heart Failure Social Worker  ? ?

## 2022-03-12 ENCOUNTER — Inpatient Hospital Stay (HOSPITAL_COMMUNITY): Payer: Medicare HMO

## 2022-03-12 DIAGNOSIS — I5021 Acute systolic (congestive) heart failure: Secondary | ICD-10-CM | POA: Diagnosis not present

## 2022-03-12 LAB — CBC
HCT: 41.4 % (ref 36.0–46.0)
Hemoglobin: 14.1 g/dL (ref 12.0–15.0)
MCH: 30.9 pg (ref 26.0–34.0)
MCHC: 34.1 g/dL (ref 30.0–36.0)
MCV: 90.6 fL (ref 80.0–100.0)
Platelets: 80 10*3/uL — ABNORMAL LOW (ref 150–400)
RBC: 4.57 MIL/uL (ref 3.87–5.11)
RDW: 14.4 % (ref 11.5–15.5)
WBC: 6.6 10*3/uL (ref 4.0–10.5)
nRBC: 0 % (ref 0.0–0.2)

## 2022-03-12 LAB — BASIC METABOLIC PANEL
Anion gap: 7 (ref 5–15)
BUN: 37 mg/dL — ABNORMAL HIGH (ref 8–23)
CO2: 41 mmol/L — ABNORMAL HIGH (ref 22–32)
Calcium: 9 mg/dL (ref 8.9–10.3)
Chloride: 84 mmol/L — ABNORMAL LOW (ref 98–111)
Creatinine, Ser: 1.06 mg/dL — ABNORMAL HIGH (ref 0.44–1.00)
GFR, Estimated: 56 mL/min — ABNORMAL LOW (ref >60)
Glucose, Bld: 97 mg/dL (ref 70–99)
Potassium: 3.1 mmol/L — ABNORMAL LOW (ref 3.5–5.1)
Sodium: 132 mmol/L — ABNORMAL LOW (ref 135–145)

## 2022-03-12 LAB — MAGNESIUM: Magnesium: 2.3 mg/dL (ref 1.7–2.4)

## 2022-03-12 MED ORDER — POTASSIUM CHLORIDE CRYS ER 20 MEQ PO TBCR
40.0000 meq | EXTENDED_RELEASE_TABLET | Freq: Once | ORAL | Status: AC
  Administered 2022-03-12: 40 meq via ORAL
  Filled 2022-03-12: qty 2

## 2022-03-12 NOTE — Progress Notes (Addendum)
? ? Advanced Heart Failure Rounding Note ? ?PCP-Cardiologist: None  ? ?Subjective:   ? ?Scr stable at 1.06, K 3.1.  BUN and CO2 trending up. ? ?? Weight up 5 lb overnight (weighed in bed). Now on po Torsemide 60 BID. ? ?SBP 90s this am ? ?PVC burden improving with increasing po amio. ? ?Feels good this am. Getting ready to work with PT. No dyspnea. Denies lightheadedness. ? ?Objective:   ?Weight Range: ?59.9 kg ?Body mass index is 28.58 kg/m?.  ? ?Vital Signs:   ?Temp:  [97.5 ?F (36.4 ?C)-98 ?F (36.7 ?C)] 97.5 ?F (36.4 ?C) (03/28 0726) ?Pulse Rate:  [34-79] 34 (03/28 0726) ?Resp:  [14-20] 14 (03/28 0726) ?BP: (56-114)/(42-75) 94/60 (03/28 0726) ?SpO2:  [94 %-100 %] 94 % (03/28 0726) ?Weight:  [59.9 kg] 59.9 kg (03/28 0456) ?Last BM Date : 03/11/22 ? ?Weight change: ?Filed Weights  ? 03/10/22 0326 03/11/22 0421 03/12/22 0456  ?Weight: 61.7 kg 57.7 kg 59.9 kg  ? ? ?Intake/Output:  ? ?Intake/Output Summary (Last 24 hours) at 03/12/2022 0849 ?Last data filed at 03/12/2022 [id number] ?Gross per 24 hour  ?Intake 1200 ml  ?Output 1600 ml  ?Net -400 ml  ?  ? ? ?Physical Exam  ?General:  Well appearing. No resp difficulty ?HEENT: normal ?Neck: supple. no JVD. Carotids 2+ bilat; no bruits. No lymphadenopathy or thryomegaly appreciated. ?Cor: PMI nondisplaced. Regular rate & rhythm. No rubs, gallops or murmurs. ?Lungs: clear ?Abdomen: soft, nontender, nondistended. No hepatosplenomegaly. No bruits or masses. Good bowel sounds. ?Extremities: no cyanosis, clubbing, rash, edema ?Neuro: alert & orientedx3, cranial nerves grossly intact. moves all 4 extremities w/o difficulty. Affect pleasant ? ? ? ?Telemetry  ? ?AF 50s-70s, 5-8 PVCs/min ? ?EKG  ? N/A ? ?Labs  ?  ?CBC ?Recent Labs  ?  03/11/22 ?0433 03/12/22 ?[id number]  ?WBC 6.7 6.6  ?HGB 14.7 14.1  ?HCT 43.7 41.4  ?MCV 90.1 90.6  ?PLT 79* 80*  ? ?Basic Metabolic Panel ?Recent Labs  ?  03/11/22 ?0433 03/11/22 ?1625 03/11/22 ?2204 03/12/22 ?[id number]  ?NA 135   < > 131* 132*  ?K 2.6*   < > 4.0 3.1*   ?CL 82*   < > 83* 84*  ?CO2 42*   < > 40* 41*  ?GLUCOSE 100*   < > 106* 97  ?BUN 30*   < > 38* 37*  ?CREATININE 0.91   < > 1.20* 1.06*  ?CALCIUM 9.4   < > 9.1 9.0  ?MG 1.9  --   --  2.3  ? < > = values in this interval not displayed.  ? ?Liver Function Tests ?Recent Labs  ?  03/10/22 ?0258 03/11/22 ?0433  ?AST 19 22  ?ALT 26 23  ?ALKPHOS 104 106  ?BILITOT 1.4* 1.3*  ?PROT 6.5 6.9  ?ALBUMIN 2.8* 2.9*  ? ?No results for input(s): LIPASE, AMYLASE in the last 72 hours. ?Cardiac Enzymes ?No results for input(s): CKTOTAL, CKMB, CKMBINDEX, TROPONINI in the last 72 hours. ? ?BNP: ?BNP (last 3 results) ?Recent Labs  ?  05/29/21 ?1443 10/02/21 ?1410 03/08/22 ?0010  ?BNP 561.9* 986.5* 1,902.5*  ? ? ?ProBNP (last 3 results) ?No results for input(s): PROBNP in the last 8760 hours. ? ? ?D-Dimer ?No results for input(s): DDIMER in the last 72 hours. ?Hemoglobin A1C ?No results for input(s): HGBA1C in the last 72 hours. ?Fasting Lipid Panel ?No results for input(s): CHOL, HDL, LDLCALC, TRIG, CHOLHDL, LDLDIRECT in the last 72 hours. ?Thyroid Function Tests ?No results for  input(s): TSH, T4TOTAL, T3FREE, THYROIDAB in the last 72 hours. ? ?Invalid input(s): FREET3 ? ?Other results: ? ? ?Imaging  ? ? ?No results found. ? ? ?Medications:   ? ? ?Scheduled Medications: ? amiodarone  200 mg Oral BID  ? edoxaban  30 mg Oral Daily  ? FLUoxetine  10 mg Oral QHS  ? losartan  12.5 mg Oral Daily  ? mouth rinse  15 mL Mouth Rinse BID  ? PHENobarbital  64.8 mg Oral BID  ? spironolactone  25 mg Oral Daily  ? torsemide  60 mg Oral BID  ? ? ?Infusions: ? ? ? ?PRN Medications: ?acetaminophen **OR** acetaminophen ? ? ? ?Patient Profile  ? ?71 y.o. female with history of chronic systolic HF/NICM, chronic AF with tachy-brady syndrome s/p PPM, PVCs, obesity, OSA, seizure disorder. Now admitted with a/c CHF. ? ?Assessment/Plan  ? ?1. Acute on chronic Biventricular HF due to NICM:  ?- ? PVC induced vs RV pacing.  ?- Echo 12/06/2016 EF 10-15%.  ?- Cath  10/17 normal coronary arteries.   ?- Echo 06/2020 EF 25-30%, RV moderately reduced.  ?- Echo 10/22 EF 20-25% RV moderately reduced ?- Echo 03/23 EF 20-25% RV moderately reduced, RVSP 60 mmHg, severe PAE, b/l pleural effusions, moderate to severe MR, mild to moderate TR, IVC dilated ?- NYHA IIIb.  ?- CXR consistent with CHF and bilateral pleural effusions ?- Had not taken home medications in several days PTA. Stressed with recent loss of her husband.  ?- Diuresed with IV lasix + metolazone. Now off. On po Torsemide 60 BID. Has diuresed at least 15 lb. Check standing weight. BUN and CO2 creeping up. ? Decrease Torsemide to 40 mg BID X 2-3 days then back up to 60 mg BID. ?- BP soft this am. No room to titrate GDMT ?- Continue 25 mg spironolactone.  ?- Hold losartan with soft BP ?- Digoxin was stopped due to elevated level.    ?- Would not use SGLT2i given high risk of genital yeast infection ?- Intolerant to ? blockers. ?- Has refused ICD. Reports recent seizure activity and presyncope prompting ED visit. ? If Ventricular arrhytmias actually triggered episode ?- Suspect some of her LE edema d/t venous stasis. ? ?2. Valvular heart disease: ?- Moderate severe MR and mild to moderate TR on echo this admit ? ?3. B/l pleural effusions: ?- Recheck CXR ?  ?4. Chest heaviness: ?-HS troponin minimally elevated 55>47, doubt ACS ?-Normal Cors on cath in 2017 ?-Suspect symptoms may have been d/t a/c CHF ?  ?5. Chronic AF with tachy-brady s/p pacemaker ?- Intolerant to ? blockers. Rate controlled.  ?- Increased amio 200 mg twice a day with frequent ectopy/PVCs.  ?- Continue endoxaban. No bleeding ?- Followed by Dr. Rayann Heman  ?- At time of gen change 7/22. < 20% RV bacing and QRS < 130 so felt not to be candidate for CRT upgrade. Patient did not want ICD.  ?  ?6. Frequent PVCs/NSVT ?- EF did not improve with PVC suppression  ?- PVCs on ECG in ED. Unable to get PVC burden off PPM given VVI settings. ?- High PVC burden. Short runs NSVT  on tele. Improving with increasing amio. ?- Amio increased to 200 mg BID, to discharge home on 200 mg daily ?- Needs 14 day Zio at discharge. Has refused ICD in past. ?- Replace K. Mag okay. ?  ?7. Seizure disorder ?- On phenobarbital ?- Level 20.  ?- Per primary team.  ?  ?8. Grief ?-  Lost husband recently ?  ?9. Venous stasis dermatitis ?- Chronic problem ? ?10. Hypokalemia ?- K 3.1. Aggressively replace. ?- Mag okay. ? ?11.[place] ?- DNR. ?  ?SDOH:  ?-Has paramedicine in the community ?-SW following. PT/OT recommending SNF. Awaiting bed. ? ?Has f/u in HF clinic scheduled in case she goes later today. ? ? ?Length of Stay: 4 ? ?FINCH, LINDSAY N, PA-C  ?03/12/2022, 8:49 AM ? ?Advanced Heart Failure Team ?Pager [phone] (M-F; 7a - 5p)  ?Please contact [facility] for night-coverage after hours (5p -7a ) and weekends on [email or web address] ? ?Patient seen with PA, agree with the above note.  ? ?Good diuresis since admission, weight down and on po torsemide.  Mildly higher BUN today.  ? ?No complaints, to work with PT.  ? ?General: NAD ?Neck: No JVD, no thyromegaly or thyroid nodule.  ?Lungs: Clear to auscultation bilaterally with normal respiratory effort. ?CV: Nondisplaced PMI.  Heart irregular S1/S2, no S3/S4, no murmur.  No peripheral edema.   ?Abdomen: Soft, nontender, no hepatosplenomegaly, no distention.  ?Skin: Intact without lesions or rashes.  ?Neurologic: Alert and oriented x 3.  ?Psych: Normal affect. ?Extremities: No clubbing or cyanosis.  ?HEENT: Normal.  ? ?Volume status much improved.  Continue current torsemide but follow creatinine closely, can cut back for a day or two if BUN continues to rise.  Continue spironolactone.  No BP room to adjust GDMT today.   ? ?PVCs better on increased amiodarone.  Would continue amiodarone 200 mg daily at discharge.  14 day Zio at discharge given presyncope history and frequent PVCs.  She has refused an ICD in the past.  ? ?Awaiting SNF placement.  ? ?Loralie Champagne ?03/12/2022 ?9:58 AM ? ?

## 2022-03-12 NOTE — Progress Notes (Signed)
FPTS Brief Note ?Reviewed patient's vitals, recent notes.  ?Vitals:  ? 03/12/22 1016 03/12/22 1918  ?BP:  (!) 80/60  ?Pulse:  70  ?Resp:  18  ?Temp:  97.6 ?F (36.4 ?C)  ?SpO2: 99% 98%  ? ?At this time, no change in plan from day progress note.  ?Derrel Nip, MD ?Page [phone] with questions about this patient.  ? ? ?

## 2022-03-12 NOTE — Progress Notes (Signed)
Physical Therapy Treatment ?Patient Details ?Name: Misty Gray ?MRN: [id number] ?DOB: Apr 17, 1951 ?Today's Date: 03/12/2022 ? ? ?History of Present Illness 71 yo female admitted 3/23, presents with acute on chronic HF.  [id number] afib, s/p PPM, systolic HF (NICM) ? ?  ?PT Comments  ? ? Patient received in recliner. She is agreeable to PT session. Patient requires cues for safety with sit to stand transfers. Ambulates with RW and min guard. Stopped at bsc in room as pure wick had not been replaced since OT session. Patient limited by fatigue with gait distance and O2 saturations down to 86% ambulating on room air ( she reports she usually only wears O2 at night).  She will continue to benefit from skilled PT while here to improve activity tolerance, strength and safety.  ?  ?Recommendations for follow up therapy are one component of a multi-disciplinary discharge planning process, led by the attending physician.  Recommendations may be updated based on patient status, additional functional criteria and insurance authorization. ? ?Follow Up Recommendations ? Skilled nursing-short term rehab (<3 hours/day) ?  ?  ?Assistance Recommended at Discharge    ?Patient can return home with the following A little help with bathing/dressing/bathroom;Help with stairs or ramp for entrance;A little help with walking and/or transfers;Assistance with cooking/housework ?  ?Equipment Recommendations ? None recommended by PT  ?  ?Recommendations for Other Services   ? ? ?  ?Precautions / Restrictions Precautions ?Precautions: Fall ?Precaution Comments: on 2L O2 ?Restrictions ?Weight Bearing Restrictions: No  ?  ? ?Mobility ? Bed Mobility ?  ?  ?  ?  ?  ?  ?  ?  ?  ? ?Transfers ?Overall transfer level: Needs assistance ?Equipment used: Rolling walker (2 wheels) ?Transfers: Sit to/from Stand ?Sit to Stand: Min guard ?  ?  ?  ?  ?  ?General transfer comment: verbal cues for hand placement to stand ?  ? ?Ambulation/Gait ?Ambulation/Gait  assistance: Min guard ?Gait Distance (Feet): 30 Feet ?Assistive device: Rolling walker (2 wheels) ?Gait Pattern/deviations: Step-through pattern, Decreased stride length, Trunk flexed, Wide base of support, Drifts right/left ?Gait velocity: slowed ?  ?  ?General Gait Details: pt with slow but steady gait with significant head and trunk flexion. no overt LOB but cues for posture. Ambulated on room air and saturations down to 86%. Patient returned to O2 via [place] and left with saturations at 99% ? ? ?Stairs ?  ?  ?  ?  ?  ? ? ?Wheelchair Mobility ?  ? ?Modified Rankin (Stroke Patients Only) ?  ? ? ?  ?Balance Overall balance assessment: Needs assistance ?Sitting-balance support: Feet supported ?Sitting balance-Leahy Scale: Good ?  ?  ?Standing balance support: Bilateral upper extremity supported, During functional activity, Reliant on assistive device for balance ?Standing balance-Leahy Scale: Fair ?  ?  ?  ?  ?  ?  ?  ?  ?  ?  ?  ?  ?  ? ?  ?Cognition Arousal/Alertness: Awake/alert ?Behavior During Therapy: [facility] for tasks assessed/performed ?Overall Cognitive Status: Within Functional Limits for tasks assessed ?  ?  ?  ?  ?  ?  ?  ?  ?  ?  ?  ?  ?  ?  ?  ?  ?General Comments: patient difficult to understand at times. ?  ?  ? ?  ?Exercises   ? ?  ?General Comments   ?  ?  ? ?Pertinent Vitals/Pain Pain Assessment ?Pain Assessment: No/denies pain  ? ? ?  Home Living   ?  ?  ?  ?  ?  ?  ?  ?  ?  ?   ?  ?Prior Function    ?  ?  ?   ? ?PT Goals (current goals can now be found in the care plan section) Acute Rehab PT Goals ?Patient Stated Goal: to get better ?PT Goal Formulation: With patient ?Time For Goal Achievement: 03/23/22 ?Potential to Achieve Goals: Good ?Progress towards PT goals: Progressing toward goals ? ?  ?Frequency ? ? ? Min 3X/week ? ? ? ?  ?PT Plan Current plan remains appropriate  ? ? ?Co-evaluation   ?  ?  ?  ?  ? ?  ?AM-PAC PT "6 Clicks" Mobility   ?Outcome Measure ? Help needed turning from your back to your  side while in a flat bed without using bedrails?: A Little ?Help needed moving from lying on your back to sitting on the side of a flat bed without using bedrails?: A Little ?Help needed moving to and from a bed to a chair (including a wheelchair)?: A Little ?Help needed standing up from a chair using your arms (e.g., wheelchair or bedside chair)?: A Little ?Help needed to walk in hospital room?: A Little ?Help needed climbing 3-5 steps with a railing? : A Lot ?6 Click Score: 17 ? ?  ?End of Session Equipment Utilized During Treatment: Gait belt;Oxygen ?Activity Tolerance: Patient tolerated treatment well;Patient limited by fatigue ?Patient left: in chair;with call bell/phone within reach;with chair alarm set ?Nurse Communication: Mobility status ?PT Visit Diagnosis: Unsteadiness on feet (R26.81);Muscle weakness (generalized) (M62.81) ?  ? ? ?Time: [id number] ?PT Time Calculation (min) (ACUTE ONLY): 25 min ? ?Charges:  $Gait Training: 23-37 mins          ?          ? ?Amanda Cockayne, PT, GCS ?03/12/22,10:22 AM ? ?

## 2022-03-12 NOTE — Progress Notes (Signed)
Mobility Specialist Progress Note  ? ? 03/12/22 1426  ?Mobility  ?Activity Ambulated with assistance in hallway  ?Level of Assistance Minimal assist, patient does 75% or more  ?Assistive Device Front wheel walker  ?Distance Ambulated (ft) 110 ft  ?Activity Response Tolerated fair  ?$Mobility charge 1 Mobility  ? ?Pt received in bed and agreeable. Ambulated on 2LO2. C/o being out of breath upon return, encouraged pursed lip breathing. Left in bed with call bell in reach and family present.  ? ?Misty Gray ?Mobility Specialist  ?  ?

## 2022-03-12 NOTE — Plan of Care (Signed)
  Problem: Clinical Measurements: Goal: Respiratory complications will improve Outcome: Progressing   Problem: Coping: Goal: Level of anxiety will decrease Outcome: Progressing   

## 2022-03-12 NOTE — Plan of Care (Signed)

## 2022-03-12 NOTE — Progress Notes (Signed)
Family Medicine Teaching Service ?Daily Progress Note ?Intern Pager: [phone] ? ?[name] Medical record number: [id number] ?Date of birth: 08-06-1951 Age: 71 y.o. Gender: female ? ?Primary Care Provider: Latrelle Dodrill, MD ?Consultants: Cardiology ?Code Status: DNR ? ?Pt Overview and Major Events to Date:  ?3/24: Admitted ? ?Assessment and Plan: ?Misty Gray is a 71 y.o. female presenting with worsening shortness of breath, found to have acute systolic heart failure PMH significant for HFrEF, A-fib, COPD, OSA, history of seizure, sick sinus syndrome with pacemaker, nonischemic cardiomyopathy. ? ?Acute on chronic systolic CHF exacerbation ?UOP 1.2 L in the last 24 hours, 7.7 L net output.  Weight down from 66.8 kg on admission to 59.9 kg today.  She is at her baseline 2 L [place]. ?-Cardiology following, appreciate recommendations ?-Torsemide 60 mg twice daily ?-Spironolactone 25 mg daily ?-Daily weights, strict I's and O's ?-PT/OT eval and treat ? ?A-fib  Sick sinus syndrome w/ pacemaker: rate-controlled, stable  electrolyte management ?Rate controlled 50s-60s. K 3.1, Mag 2.3 with goals K>4 and Mag >2.  ?-Amiodarone 200mg  BID ?-Discharge dosage of amiodarone to be 200mg  daily ?-Kcl 40 mEq x 2 ?-2 wk zio monitor at discharge ?-Daily BMP ? ?FEN/GI: Heart healthy ?PPx: Edoxaban ?Dispo:SNF today. Barriers include none, stable for discharge.  ? ?Subjective:  ?Pt states she is doing even better than yesterday. She is glad her husband went to heaven and "you just gotta make the best of this life". ? ?Objective: ?Temp:  [97.8 ?F (36.6 ?C)-98 ?F (36.7 ?C)] 97.8 ?F (36.6 ?C) (03/28 0457) ?Pulse Rate:  [59-79] 59 (03/28 0457) ?Resp:  [17-20] 18 (03/28 0457) ?BP: (56-114)/(42-75) 94/75 (03/28 0457) ?SpO2:  [97 %-100 %] 100 % (03/28 0457) ?Weight:  [59.9 kg] 59.9 kg (03/28 0456) ?Physical Exam: ?General: awake, alert, NAD ?Cardiovascular: irregularly irregular, no murmurs auscultated ?Respiratory: CTAB, no  increased work of breathing, 2L [place] in place ?Extremities: no pitting edema appreciated ? ?Laboratory: ?Recent Labs  ?Lab 03/10/22 ?0258 03/11/22 ?0433 03/12/22 ?03/13/22  ?WBC 5.9 6.7 6.6  ?HGB 13.5 14.7 14.1  ?HCT 40.9 43.7 41.4  ?PLT 71* 79* 80*  ? ?Recent Labs  ?Lab 03/09/22 ?0227 03/10/22 ?0258 03/10/22 ?1038 03/11/22 ?0433 03/11/22 ?1625 03/11/22 ?2204  ?NA 138 134*   < > 135 132* 131*  ?K 3.7 2.2*   < > 2.6* 3.1* 4.0  ?CL 103 88*   < > 82* 82* 83*  ?CO2 29 38*   < > 42* 42* 40*  ?BUN 29* 31*   < > 30* 32* 38*  ?CREATININE 0.95 1.10*   < > 0.91 0.99 1.20*  ?CALCIUM 9.3 9.2   < > 9.4 9.3 9.1  ?PROT 6.0* 6.5  --  6.9  --   --   ?BILITOT 1.2 1.4*  --  1.3*  --   --   ?ALKPHOS 100 104  --  106  --   --   ?ALT 27 26  --  23  --   --   ?AST 20 19  --  22  --   --   ?GLUCOSE 105* 102*   < > 100* 104* 106*  ? < > = values in this interval not displayed.  ? ?Mag: 2.3 ? ?Imaging/Diagnostic Tests: ?No results found. ? ?03/13/22, DO ?03/12/2022, 6:59 AM ?PGY-1, [facility] ?FPTS Intern pager: [phone], text pages welcome ? ?

## 2022-03-12 NOTE — Progress Notes (Signed)
Pt refusing cpap for the night. ?

## 2022-03-12 NOTE — TOC Progression Note (Addendum)
Transition of Care (TOC) - Progression Note  ? ? ?Patient Details  ?Name: Misty Gray ?MRN: [id number] ?Date of Birth: 1951-06-26 ? ?Transition of Care (TOC) CM/SW Contact  ?Calynn Ferrero, LCSW ?Phone Number: ?03/12/2022, 1:19 PM ? ?Clinical Narrative:    ?HF CSW spoke with the patient's son to follow up from yesterday about SNF and [facility] being the next option and provided him with the address so that he is aware of the location. CSW let him know that the insurance is still pending before his mom can discharge. ? ?4:26pm - HF CSW heard from Star at [facility] and she reported the insurance authorization came back and [name] can DC tomorrow morning if she is medically ready. ? ?CSW will continue to follow throughout discharge. ? ? ?Expected Discharge Plan: Skilled Nursing Facility ?Barriers to Discharge: Continued Medical Work up ? ?Expected Discharge Plan and Services ?Expected Discharge Plan: Skilled Nursing Facility ?In-house Referral: Clinical Social Work ?Discharge Planning Services: CM Consult ?Post Acute Care Choice: Skilled Nursing Facility ?Living arrangements for the past 2 months: Single Family Home ?                ?  ?  ?  ?  ?  ?  ?  ?  ?  ?  ? ? ?Social Determinants of Health (SDOH) Interventions ?  ? ?Readmission Risk Interventions ?   ? View : No data to display.  ?  ?  ?  ? ?Oneill Bais, MSW, LCSW ?[phone] ?Heart Failure Social Worker  ?

## 2022-03-12 NOTE — Progress Notes (Signed)
FPTS Brief Progress Note ? ?S: Patient is sitting in bed comfortably, no acute distress.  Denies any issues at this time. ? ? ?O: ?BP (!) 58/42 (BP Location: Right Arm)   Pulse 64   Temp 97.8 ?F (36.6 ?C) (Oral)   Resp 19   Ht 4\' 9"  (1.448 m)   Wt 57.7 kg   SpO2 97%   BMI 27.53 kg/m?   ?General: Elderly female sitting in bed comfortably no acute distress ? ?A/P: ?Acute on chronic systolic heart failure ?- Orders reviewed. Labs for AM ordered, which was adjusted as needed.  ?- Closely monitor for worsening of respiratory status ?- Continue medications per team daily progress note ? ?Patient currently not wearing CPAP.  Continues to refuse it.  We will continue to encourage use. ? ?Gifford Shave, MD ?03/12/2022, 12:03 AM ?PGY-3, [facility] Night Resident  ?Please page [phone] with questions.  ? ? ?

## 2022-03-12 NOTE — Plan of Care (Signed)
?  Problem: Clinical Measurements: ?Goal: Respiratory complications will improve ?Outcome: Progressing ?  ?Problem: Elimination: ?Goal: Will not experience complications related to urinary retention ?Outcome: Progressing ?  ?

## 2022-03-12 NOTE — Care Management Important Message (Signed)
Important Message ? ?Patient Details  ?Name: Misty Gray ?MRN: [id number] ?Date of Birth: 1951/04/05 ? ? ?Medicare Important Message Given:  Yes ? ? ? ? ?Shelda Altes ?03/12/2022, 8:51 AM ?

## 2022-03-12 NOTE — Progress Notes (Signed)
Occupational Therapy Treatment ?Patient Details ?Name: Misty Gray ?MRN: [id number] ?DOB: 1951/10/08 ?Today's Date: 03/12/2022 ? ? ?History of present illness 71 yo female admitted 3/23, presents with acute on chronic HF.  VEL:FYBOFBP afib, s/p PPM, systolic HF (NICM) ?  ?OT comments ? Patient pleasant but difficult to understand at times. Patient was able to get to EOB with assistance to scoot to EOB. Patient able to ambulate to sink for grooming tasks and performed without seated rest break. Patient able to perform toilet transfer to 3n1 with RW and performed toilet hygiene standing. Patient is making good progress and could benefit from further OT services. Acute OT to continue to follow.   ? ?Recommendations for follow up therapy are one component of a multi-disciplinary discharge planning process, led by the attending physician.  Recommendations may be updated based on patient status, additional functional criteria and insurance authorization. ?   ?Follow Up Recommendations ? Skilled nursing-short term rehab (<3 hours/day)  ?  ?Assistance Recommended at Discharge Frequent or constant Supervision/Assistance  ?Patient can return home with the following ? A little help with walking and/or transfers;A lot of help with bathing/dressing/bathroom;Direct supervision/assist for medications management;Direct supervision/assist for financial management ?  ?Equipment Recommendations ? Other (comment) (TBD)  ?  ?Recommendations for Other Services   ? ?  ?Precautions / Restrictions Precautions ?Precautions: Fall ?Precaution Comments: on 2L O2 ?Restrictions ?Weight Bearing Restrictions: No  ? ? ?  ? ?Mobility Bed Mobility ?Overal bed mobility: Needs Assistance ?Bed Mobility: Supine to Sit ?  ?  ?Supine to sit: Min assist ?  ?  ?General bed mobility comments: min assist to scoot to EOB ?  ? ?Transfers ?Overall transfer level: Needs assistance ?Equipment used: Rolling walker (2 wheels) ?Transfers: Sit to/from Stand, Bed to  chair/wheelchair/BSC ?Sit to Stand: Min guard ?  ?  ?Step pivot transfers: Min assist ?  ?  ?General transfer comment: verbal cues for hand placement to stand ?  ?  ?Balance Overall balance assessment: Needs assistance ?Sitting-balance support: No upper extremity supported, Feet supported ?Sitting balance-Leahy Scale: Fair ?Sitting balance - Comments: able to maintain sitting balance on EOB without assistance ?  ?Standing balance support: Bilateral upper extremity supported, During functional activity ?Standing balance-Leahy Scale: Fair ?Standing balance comment: stood at sink for grooming and with toilet hygiene with min guard assist ?  ?  ?  ?  ?  ?  ?  ?  ?  ?  ?  ?   ? ?ADL either performed or assessed with clinical judgement  ? ?ADL Overall ADL's : Needs assistance/impaired ?  ?  ?Grooming: Wash/dry hands;Wash/dry face;Oral care;Min guard;Standing ?Grooming Details (indicate cue type and reason): performed standing at sink ?  ?  ?  ?  ?  ?  ?  ?  ?Toilet Transfer: Minimal assistance;BSC/3in1;Rolling walker (2 wheels) ?Toilet Transfer Details (indicate cue type and reason): ambulated to [facility] with min assist for safety ?Toileting- Architect and Hygiene: Min guard;Sit to/from stand ?Toileting - Clothing Manipulation Details (indicate cue type and reason): stood for toilet hygiene ?  ?  ?Functional mobility during ADLs: Minimal assistance;Rolling walker (2 wheels) ?General ADL Comments: min guard for standing balance at sink and min assist for mobilty due to assistance with walker and lines ?  ? ?Extremity/Trunk Assessment   ?  ?  ?  ?  ?  ? ?Vision   ?  ?  ?Perception   ?  ?Praxis   ?  ? ?Cognition Arousal/Alertness: Awake/alert ?  Behavior During Therapy: [facility] for tasks assessed/performed ?Overall Cognitive Status: Within Functional Limits for tasks assessed ?  ?  ?  ?  ?  ?  ?  ?  ?  ?  ?  ?  ?  ?  ?  ?  ?General Comments: patient difficult to understand at times.  frequent cues for safety ?  ?  ?    ?Exercises   ? ?  ?Shoulder Instructions   ? ? ?  ?General Comments    ? ? ?Pertinent Vitals/ Pain       Pain Assessment ?Pain Assessment: No/denies pain ? ?Home Living   ?  ?  ?  ?  ?  ?  ?  ?  ?  ?  ?  ?  ?  ?  ?  ?  ?  ?  ? ?  ?Prior Functioning/Environment    ?  ?  ?  ?   ? ?Frequency ? Min 2X/week  ? ? ? ? ?  ?Progress Toward Goals ? ?OT Goals(current goals can now be found in the care plan section) ? Progress towards OT goals: Progressing toward goals ? ?Acute Rehab OT Goals ?Patient Stated Goal: get better ?OT Goal Formulation: With patient ?Time For Goal Achievement: 03/23/22 ?Potential to Achieve Goals: Good ?ADL Goals ?Pt Will Perform Grooming: Independently ?Pt Will Perform Upper Body Bathing: with modified independence ?Pt Will Perform Lower Body Bathing: with set-up ?Pt Will Transfer to Toilet: with modified independence;ambulating  ?Plan Discharge plan remains appropriate   ? ?Co-evaluation ? ? ?   ?  ?  ?  ?  ? ?  ?AM-PAC OT "6 Clicks" Daily Activity     ?Outcome Measure ? ? Help from another person eating meals?: None ?Help from another person taking care of personal grooming?: A Little ?Help from another person toileting, which includes using toliet, bedpan, or urinal?: A Little ?Help from another person bathing (including washing, rinsing, drying)?: A Lot ?Help from another person to put on and taking off regular upper body clothing?: A Little ?Help from another person to put on and taking off regular lower body clothing?: A Lot ?6 Click Score: 17 ? ?  ?End of Session Equipment Utilized During Treatment: Gait belt;Rolling walker (2 wheels) ? ?OT Visit Diagnosis: Unsteadiness on feet (R26.81);Muscle weakness (generalized) (M62.81) ?  ?Activity Tolerance Patient tolerated treatment well ?  ?Patient Left in chair;with call bell/phone within reach;with chair alarm set ?  ?Nurse Communication Mobility status ?  ? ?   ? ?Time: [phone] ?OT Time Calculation (min): 36 min ? ?Charges: OT General  Charges ?$OT Visit: 1 Visit ?OT Treatments ?$Self Care/Home Management : 23-37 mins ? ?Alfonse Flavors, OTA ?Acute Rehabilitation Services  ?Pager [phone] ?Office [phone] ? ? ?Misty Gray ?03/12/2022, 10:15 AM ?

## 2022-03-13 ENCOUNTER — Telehealth (HOSPITAL_COMMUNITY): Payer: Self-pay | Admitting: Licensed Clinical Social Worker

## 2022-03-13 DIAGNOSIS — I493 Ventricular premature depolarization: Secondary | ICD-10-CM

## 2022-03-13 LAB — MAGNESIUM: Magnesium: 2.1 mg/dL (ref 1.7–2.4)

## 2022-03-13 LAB — BASIC METABOLIC PANEL
Anion gap: 11 (ref 5–15)
Anion gap: 9 (ref 5–15)
BUN: 44 mg/dL — ABNORMAL HIGH (ref 8–23)
BUN: 40 mg/dL — ABNORMAL HIGH (ref 8–23)
CO2: 39 mmol/L — ABNORMAL HIGH (ref 22–32)
CO2: 39 mmol/L — ABNORMAL HIGH (ref 22–32)
Calcium: 9.3 mg/dL (ref 8.9–10.3)
Calcium: 9.3 mg/dL (ref 8.9–10.3)
Chloride: 81 mmol/L — ABNORMAL LOW (ref 98–111)
Chloride: 84 mmol/L — ABNORMAL LOW (ref 98–111)
Creatinine, Ser: 1.11 mg/dL — ABNORMAL HIGH (ref 0.44–1.00)
Creatinine, Ser: 1.02 mg/dL — ABNORMAL HIGH (ref 0.44–1.00)
GFR, Estimated: 53 mL/min — ABNORMAL LOW (ref >60)
GFR, Estimated: 58 mL/min — ABNORMAL LOW (ref >60)
Glucose, Bld: 92 mg/dL (ref 70–99)
Glucose, Bld: 95 mg/dL (ref 70–99)
Potassium: 2.9 mmol/L — ABNORMAL LOW (ref 3.5–5.1)
Potassium: 4.6 mmol/L (ref 3.5–5.1)
Sodium: 131 mmol/L — ABNORMAL LOW (ref 135–145)
Sodium: 132 mmol/L — ABNORMAL LOW (ref 135–145)

## 2022-03-13 MED ORDER — POTASSIUM CHLORIDE CRYS ER 10 MEQ PO TBCR
40.0000 meq | EXTENDED_RELEASE_TABLET | Freq: Once | ORAL | Status: AC
  Administered 2022-03-13: 40 meq via ORAL
  Filled 2022-03-13: qty 4

## 2022-03-13 MED ORDER — EDOXABAN TOSYLATE 30 MG PO TABS: 30.0000 mg | ORAL_TABLET | Freq: Every day | ORAL | Status: DC

## 2022-03-13 MED ORDER — POTASSIUM CHLORIDE CRYS ER 20 MEQ PO TBCR: 40.0000 meq | EXTENDED_RELEASE_TABLET | Freq: Two times a day (BID) | ORAL | 0 refills | Status: DC

## 2022-03-13 MED ORDER — POTASSIUM CHLORIDE CRYS ER 20 MEQ PO TBCR
40.0000 meq | EXTENDED_RELEASE_TABLET | Freq: Once | ORAL | Status: AC
  Administered 2022-03-13: 40 meq via ORAL
  Filled 2022-03-13: qty 2

## 2022-03-13 MED ORDER — AMIODARONE HCL 200 MG PO TABS: 200.0000 mg | ORAL_TABLET | Freq: Two times a day (BID) | ORAL | 0 refills | Status: DC

## 2022-03-13 MED ORDER — TORSEMIDE 20 MG PO TABS
40.0000 mg | ORAL_TABLET | Freq: Two times a day (BID) | ORAL | Status: DC
  Administered 2022-03-14: 40 mg via ORAL
  Filled 2022-03-13: qty 2

## 2022-03-13 MED ORDER — SPIRONOLACTONE 25 MG PO TABS: 25.0000 mg | ORAL_TABLET | Freq: Every day | ORAL | Status: AC

## 2022-03-13 MED ORDER — TORSEMIDE 40 MG PO TABS: 40.0000 mg | ORAL_TABLET | Freq: Two times a day (BID) | ORAL | Status: DC

## 2022-03-13 NOTE — TOC Progression Note (Addendum)
Transition of Care (TOC) - Progression Note  ? ?Patient Details  ?Name: Misty Gray ?MRN: [id number] ?Date of Birth: [birth year] ? ?Transition of Care (TOC) CM/SW Contact  ?Lanny Lipkin, LCSW ?Phone Number: ?03/13/2022, 11:34 AM ? ?Clinical Narrative:    ?[name] has a bed available at [place], [place] when she is medically ready for DC. Insurance authorization has been approved. ?[facility] request CSW inform them about DC before 1pm today for reasons related to SNF DON. [name] will need to DC first thing tomorrow morning. ? ?CSW will continue to follow throughout discharge. ? ? ?Expected Discharge Plan: [place] ?Barriers to Discharge: Continued Medical Work up ? ?Expected Discharge Plan and Services ?Expected Discharge Plan: [facility] ?In-house Referral: Clinical Social Work ?Discharge Planning Services: CM Consult ?Post Acute Care Choice: [facility] ?Living arrangements for the past 2 months: [place] ?                ?  ?  ?  ?  ?  ?  ?  ?  ?  ?  ? ? ?Social Determinants of Health (SDOH) Interventions ?  ? ?Readmission Risk Interventions ?   ? View : No data to display.  ?  ?  ?  ? ?[place], MSW, LCSW ?[phone] ?Heart Failure Social Worker  ?

## 2022-03-13 NOTE — Progress Notes (Addendum)
? ? Advanced Heart Failure Rounding Note ? ?PCP-Cardiologist: None  ? ?Subjective:   ? ? ?No complaints today.  ?Objective:   ?Weight Range: ?61.5 kg ?Body mass index is 29.34 kg/m?.  ? ?Vital Signs:   ?Temp:  [97.6 ?F (36.4 ?C)-98 ?F (36.7 ?C)] 98 ?F (36.7 ?C) (03/29 0350) ?Pulse Rate:  [59-70] 59 (03/29 0350) ?Resp:  [16-18] 16 (03/29 0350) ?BP: (80-93)/(50-60) 93/50 (03/29 0350) ?SpO2:  [98 %-99 %] 98 % (03/29 0350) ?Weight:  [61.5 kg] 61.5 kg (03/29 0350) ?Last BM Date : 03/12/22 ? ?Weight change: ?Filed Weights  ? 03/12/22 0456 03/12/22 0900 03/13/22 0350  ?Weight: 59.9 kg 61.2 kg 61.5 kg  ? ? ?Intake/Output:  ? ?Intake/Output Summary (Last 24 hours) at 03/13/2022 0935 ?Last data filed at 03/13/2022 0810 ?Gross per 24 hour  ?Intake 1552 ml  ?Output 1250 ml  ?Net 302 ml  ?  ? ? ?Physical Exam  ?General:  In bed.  No resp difficulty ?HEENT: normal ?Neck: supple. JVP 5-6 . Carotids 2+ bilat; no bruits. No lymphadenopathy or thryomegaly appreciated. ?Cor: PMI nondisplaced. Irregular rate & rhythm. No rubs, gallops or murmurs. ?Lungs: clear ?Abdomen: soft, nontender, nondistended. No hepatosplenomegaly. No bruits or masses. Good bowel sounds. ?Extremities: no cyanosis, clubbing, rash, edema ?Neuro: alert & orientedx3, cranial nerves grossly intact. moves all 4 extremities w/o difficulty. Affect pleasant ? ? ? ?Telemetry  ? ?A Fib 59-70s  PVCs 10-15 per hour ? ?EKG  ? N/A ? ?Labs  ?  ?CBC ?Recent Labs  ?  03/11/22 ?0433 03/12/22 ?[id number]  ?WBC 6.7 6.6  ?HGB 14.7 14.1  ?HCT 43.7 41.4  ?MCV 90.1 90.6  ?PLT 79* 80*  ? ?Basic Metabolic Panel ?Recent Labs  ?  03/11/22 ?0433 03/11/22 ?1625 03/12/22 ?[id number] 03/13/22 ?0401  ?NA 135   < > 132* 131*  ?K 2.6*   < > 3.1* 2.9*  ?CL 82*   < > 84* 81*  ?CO2 42*   < > 41* 39*  ?GLUCOSE 100*   < > 97 92  ?BUN 30*   < > 37* 44*  ?CREATININE 0.91   < > 1.06* 1.11*  ?CALCIUM 9.4   < > 9.0 9.3  ?MG 1.9  --  2.3  --   ? < > = values in this interval not displayed.  ? ?Liver Function  Tests ?Recent Labs  ?  03/11/22 ?0433  ?AST 22  ?ALT 23  ?ALKPHOS 106  ?BILITOT 1.3*  ?PROT 6.9  ?ALBUMIN 2.9*  ? ?No results for input(s): LIPASE, AMYLASE in the last 72 hours. ?Cardiac Enzymes ?No results for input(s): CKTOTAL, CKMB, CKMBINDEX, TROPONINI in the last 72 hours. ? ?BNP: ?BNP (last 3 results) ?Recent Labs  ?  05/29/21 ?1443 10/02/21 ?1410 03/08/22 ?0010  ?BNP 561.9* 986.5* 1,902.5*  ? ? ?ProBNP (last 3 results) ?No results for input(s): PROBNP in the last 8760 hours. ? ? ?D-Dimer ?No results for input(s): DDIMER in the last 72 hours. ?Hemoglobin A1C ?No results for input(s): HGBA1C in the last 72 hours. ?Fasting Lipid Panel ?No results for input(s): CHOL, HDL, LDLCALC, TRIG, CHOLHDL, LDLDIRECT in the last 72 hours. ?Thyroid Function Tests ?No results for input(s): TSH, T4TOTAL, T3FREE, THYROIDAB in the last 72 hours. ? ?Invalid input(s): FREET3 ? ?Other results: ? ? ?Imaging  ? ? ?DG CHEST PORT 1 VIEW ? ?Result Date: 03/12/2022 ?CLINICAL DATA:  Bilateral pleural effusion EXAM: PORTABLE CHEST 1 VIEW COMPARISON:  03/08/2022 FINDINGS: Cardiac enlargement. Bilateral airspace disease compatible with  pulmonary edema which appears unchanged. Left greater than right pleural effusion and atelectasis also unchanged. Pacemaker unchanged. IMPRESSION: Congestive heart failure with pulmonary edema unchanged. Bilateral pleural effusion and atelectasis left greater than right unchanged. Electronically Signed   By: Franchot Gallo M.D.   On: 03/12/2022 16:07   ? ? ?Medications:   ? ? ?Scheduled Medications: ? amiodarone  200 mg Oral BID  ? edoxaban  30 mg Oral Daily  ? FLUoxetine  10 mg Oral QHS  ? mouth rinse  15 mL Mouth Rinse BID  ? PHENobarbital  64.8 mg Oral BID  ? potassium chloride  40 mEq Oral Once  ? spironolactone  25 mg Oral Daily  ? torsemide  60 mg Oral BID  ? ? ?Infusions: ? ? ? ?PRN Medications: ?acetaminophen **OR** acetaminophen ? ? ? ?Patient Profile  ? ?71 y.o. female with history of chronic systolic  HF/NICM, chronic AF with tachy-brady syndrome s/p PPM, PVCs, obesity, OSA, seizure disorder. Now admitted with a/c CHF. ? ?Assessment/Plan  ? ?1. Acute on chronic Biventricular HF due to NICM:  ?- ? PVC induced vs RV pacing.  ?- Echo 12/06/2016 EF 10-15%.  ?- Cath 10/17 normal coronary arteries.   ?- Echo 06/2020 EF 25-30%, RV moderately reduced.  ?- Echo 10/22 EF 20-25% RV moderately reduced ?- Echo 03/23 EF 20-25% RV moderately reduced, RVSP 60 mmHg, severe PAE, b/l pleural effusions, moderate to severe MR, mild to moderate TR, IVC dilated ?- NYHA IIIb.  ?- CXR consistent with CHF and bilateral pleural effusions ?- Had not taken home medications in several days PTA. Stressed with recent loss of her husband.  ?- Diuresed with IV lasix + metolazone. Now off.  ?- Volume status stable. K low. Supp K . Hold evening torsemide. Cut back torsemide 40 mg twice a day starting tomorrow.  ?- BP soft this am. No room to titrate GDMT ?- Continue 25 mg spironolactone.  ?- Hold losartan with soft BP ?- Digoxin was stopped due to elevated level.    ?- Would not use SGLT2i given high risk of genital yeast infection ?- Intolerant to ? blockers. ?- Has refused ICD. Reports recent seizure activity and presyncope prompting ED visit. ? If Ventricular arrhytmias actually triggered episode ?- Suspect some of her LE edema d/t venous stasis. ? ?2. Valvular heart disease: ?- Moderate severe MR and mild to moderate TR on echo this admit ? ?3. B/l pleural effusions: ?- Recheck CXR ?  ?4. Chest heaviness: ?-HS troponin minimally elevated 55>47, doubt ACS ?-Normal Cors on cath in 2017 ?-Suspect symptoms may have been d/t a/c CHF ?  ?5. Chronic AF with tachy-brady s/p pacemaker ?- Intolerant to ? blockers. Rate controlled.  ?- Increased amio 200 mg twice a day with frequent ectopy/PVCs.  ?- Continue endoxaban. No bleeding ?- Followed by Dr. Rayann Heman  ?- At time of gen change 7/22. < 20% RV bacing and QRS < 130 so felt not to be candidate for CRT  upgrade. Patient did not want ICD.  ?  ?6. Frequent PVCs/NSVT ?- EF did not improve with PVC suppression  ?- PVCs on ECG in ED. Unable to get PVC burden off PPM given VVI settings. ?- High PVC burden. Short runs NSVT on tele. Improving with increasing amio. ?- Amio increased to 200 mg BID, to discharge home on 200 mg daily ?- Has refused ICD in past. ?- Supp K . ?  ?7. Seizure disorder ?- On phenobarbital ?- Level 20.  ?- Per primary  team.  ?  ?8. Grief ?- Lost husband recently ?  ?9. Venous stasis dermatitis ?- Chronic problem ? ?10. Hypokalemia ?- K 2.9 - Give 40 meq K twice today . Check labs this afternoon.  ?. Aggressively replace. ?- Mag okay. ? ?11.[place] ?- DNR. ?  ?SDOH:  ?-Has paramedicine in the community ?-SW following. PT/OT recommending SNF. Awaiting bed. ? ?Needs repeat K prior to d/c would K stabilized prior to d/c.  ? ?HF meds for d/c  ?K Dur 40  meq twice a day  ?Torsemide 40 mg twice a day 3/30 ?Spironolactone 25 mg daily  ?Amio 200 mg twice a day  ? ? ?Length of Stay: 5 ? ?Darrick Grinder, NP  ?03/13/2022, 9:35 AM ? ?Advanced Heart Failure Team ?Pager [phone] (M-F; 7a - 5p)  ?Please contact [facility] for night-coverage after hours (5p -7a ) and weekends on [email or web address] ? ?Patient seen and examined with the above-signed Advanced Practice Provider and/or Housestaff. I personally reviewed laboratory data, imaging studies and relevant notes. I independently examined the patient and formulated the important aspects of the plan. I have edited the note to reflect any of my changes or salient points. I have personally discussed the plan with the patient and/or family. ? ?Denies CP or SOB. No orthopnea or PND. Supplementing potassium. Still with frequent PVCs.  ? ?General:  Lying in bed No resp difficulty ?HEENT: normal ?Neck: supple. no JVD. Carotids 2+ bilat; no bruits. No lymphadenopathy or thryomegaly appreciated. ?Cor: PMI nondisplaced. Regular rate & rhythm. No rubs, gallops or murmurs. ?Lungs:  clear ?Abdomen: obese  soft, nontender, nondistended. No hepatosplenomegaly. No bruits or masses. Good bowel sounds. ?Extremities: no cyanosis, clubbing, rash, tr edema + venous stasis changes ?Neuro: alert & orientedx3, cr

## 2022-03-13 NOTE — Progress Notes (Signed)
FPTS Brief Note ?Reviewed patient's vitals, recent notes.  ?Vitals:  ? 03/13/22 1048 03/13/22 2041  ?BP: (!) 88/55 (!) 86/57  ?Pulse: 70 65  ?Resp: 18 20  ?Temp: 98.7 ?F (37.1 ?C) 98.6 ?F (37 ?C)  ?SpO2: 95% 100%  ? ?At this time, no change in plan from day progress note.  ?Derrel Nip, MD ?Page [phone] with questions about this patient.  ? ? ?

## 2022-03-13 NOTE — Discharge Summary (Signed)
Family Medicine Teaching Service ?Hospital Discharge Summary ? ?[name] Medical record number: [id number] ?Date of birth: Nov 18, 1951 Age: 71 y.o. Gender: female ?Date of Admission: 03/07/2022  Date of Discharge: 03/13/2022 ?Admitting Physician: Katha Cabal, DO ? ?Primary Care Provider: Latrelle Dodrill, MD ?Consultants: Heart failure ? ?Indication for Hospitalization: Acute on chronic systolic heart failure exacerbation ? ?Discharge Diagnoses/Problem List:  ?Principal Problem: ?  Acute CHF (congestive heart failure) (HCC) ?  ? ?Disposition: SNF ? ?Discharge Condition: Stable ? ?Discharge Exam:  ?Blood pressure (!) 88/55, pulse 70, temperature 98.7 ?F (37.1 ?C), temperature source Oral, resp. rate 18, height 4\' 9"  (1.448 m), weight 61.5 kg, SpO2 95 %.  ?General: Awake, alert, NAD ?Cardiovascular: Irregularly irregular, no murmurs auscultated ?Respiratory: CTAB, no increased work of breathing, 2 L [facility] in place ?Extremities: No pitting edema present ? ?Brief Hospital Course:  ?Misty Gray is a 71 y.o.female with a history of HFrEF, Afib, COPD, OSA, hx seizure, sick sinus syndrome w/ pacemaker, nonischemic cardiomyopathy who was admitted to the [facility] at [facility] for worsening shortness of breath. Her hospital course is detailed below: ? ?Acute on chronic systolic heart failure exacerbation ?Presented with shortness of breath x2 days and increased swelling of the legs with increased oxygen requirement as compared to 2 L [place] at home. Received Solu-Medrol 125 mg and albuterol prior to arrival. BNP 1900 with flat troponins. CXR showed moderate features of CHF with left-greater-than-right pleural effusions and overlying lung opacities.  Cardiology consulted and recommended IV Lasix and spironolactone. She was eventually started on metolazone due to poor diuresis response. Echo showed LVEF 20 to 25%, global hypokinesis, RV moderately enlarged, PAP moderately elevated, severely  dilated bilateral atria. She received 5 days of diuresis with a discharge weight of 61.5 kg down from 66.8 kg. Discharged with instructions to take torsemide 40mg  BID.  ? ?Hypokalemia ?Potassium monitored throughout hospitalization and replenished as necessary. Continued to be depleted by torsemide. Patient discharged with instructions to take 40 mEq of Kcl BID. ? ?Atrial fibrillation; sick sinus syndrome w/ pacemaker ?Patient rate-controlled throughout admission. Cardiology consulted and increased Amiodarone from 100mg  to 200mg  twice daily. ? ?Other chronic conditions were medically managed with home medications and formulary alternatives as necessary (OSA) ? ?PCP Follow-up Recommendations: ?Repeat BMP to ensure resolution of hypokalemia ?If ambulating well and no orthostatic symptoms, consider restarting low dose ARB ?Ensure follow up at [facility] ? ?Significant Procedures: None ? ?Significant Labs and Imaging:  ?Recent Labs  ?Lab 03/10/22 ?0258 03/11/22 ?0433 03/12/22 ?  ?WBC 5.9 6.7 6.6  ?HGB 13.5 14.7 14.1  ?HCT 40.9 43.7 41.4  ?PLT 71* 79* 80*  ? ?Recent Labs  ?Lab 03/08/22 ?0010 03/09/22 ?0227 03/10/22 ?0258 03/10/22 ?1038 03/11/22 ?0433 03/11/22 ?1625 03/11/22 ?2204 03/12/22 ?03/13/22 03/13/22 ?0401 03/13/22 ?1423  ?NA 136 138 134*   < > 135 132* 131* 132* 131* 132*  ?K 3.9 3.7 2.2*   < > 2.6* 3.1* 4.0 3.1* 2.9* 4.6  ?CL 100 103 88*   < > 82* 82* 83* 84* 81* 84*  ?CO2 23 29 38*   < > 42* 42* 40* 41* 39* 39*  ?GLUCOSE 131* 105* 102*   < > 100* 104* 106* 97 92 95  ?BUN 25* 29* 31*   < > 30* 32* 38* 37* 44* 40*  ?CREATININE 1.09* 0.95 1.10*   < > 0.91 0.99 1.20* 1.06* 1.11* 1.02*  ?CALCIUM 9.9 9.3 9.2   < > 9.4  9.3 9.1 9.0 9.3 9.3  ?MG  --  1.7 1.8  --  1.9  --   --  2.3  --   --   ?ALKPHOS 125 100 104  --  106  --   --   --   --   --   ?AST 33 20 19  --  22  --   --   --   --   --   ?ALT 40 27 26  --  23  --   --   --   --   --   ?ALBUMIN 3.4* 2.6* 2.8*  --  2.9*  --   --   --   --   --   ? < > =  values in this interval not displayed.  ? ?Results/Tests Pending at Time of Discharge: None ? ?Discharge Medications:  ?Allergies as of 03/13/2022   ? ?   Reactions  ? Aspirin Hives, Rash  ? Penicillins Rash, Other (See Comments)  ? Has patient had a PCN reaction causing immediate rash, facial/tongue/throat swelling, SOB or lightheadedness with hypotension: Yes ?Has patient had a PCN reaction causing severe rash involving mucus membranes or skin necrosis: No ?Has patient had a PCN reaction that required hospitalization No ?Has patient had a PCN reaction occurring within the last 10 years: Yes ?If all of the above answers are "NO", then may proceed with Cephalosporin use. ?Patient has tolerated cephalosporins in 2017.  ? Tape Other (See Comments)  ? Medical tape only  ? Wool Alcohol [lanolin] Hives  ? Amoxicillin Rash  ? Patient has tolerated cephalosporins  ? Ampicillin Itching, Rash  ? Patient has tolerated cephalosporins  ? ?  ? ?  ?Medication List  ?  ? ?STOP taking these medications   ? ?omeprazole 20 MG capsule ?Commonly known as: PRILOSEC ?  ?potassium chloride 10 MEQ tablet ?Commonly known as: KLOR-CON ?  ? ?  ? ?TAKE these medications   ? ?amiodarone 200 MG tablet ?Commonly known as: PACERONE ?Take 1 tablet (200 mg total) by mouth 2 (two) times daily. ?What changed:  ?how much to take ?when to take this ?  ?D3-1000 25 MCG (1000 UT) tablet ?Generic drug: Cholecalciferol ?TAKE 1 TABLET BY MOUTH DAILY (AM) ?What changed: See the new instructions. ?  ?edoxaban 30 MG Tabs tablet ?Commonly known as: SAVAYSA ?Take 1 tablet (30 mg total) by mouth daily. ?Start taking on: March 14, 2022 ?What changed:  ?medication strength ?See the new instructions. ?  ?FLUoxetine 10 MG tablet ?Commonly known as: PROZAC ?Take 10 mg by mouth daily. ?  ?fluticasone 50 MCG/ACT nasal spray ?Commonly known as: FLONASE ?Place 2 sprays into both nostrils daily as needed. For congestion. ?What changed:  ?reasons to take this ?additional  instructions ?  ?PHENobarbital 64.8 MG tablet ?Commonly known as: LUMINAL ?TAKE ONE TABLET BY MOUTH TWICE DAILY (AM+BEDTIME) ?What changed: See the new instructions. ?  ?spironolactone 25 MG tablet ?Commonly known as: ALDACTONE ?Take 1 tablet (25 mg total) by mouth daily. ?Start taking on: March 14, 2022 ?What changed:  ?how much to take ?how to take this ?when to take this ?additional instructions ?  ?Torsemide 40 MG Tabs ?Take 40 mg by mouth 2 (two) times daily. ?Start taking on: March 14, 2022 ?What changed:  ?medication strength ?See the new instructions. ?  ? ?  ? ? ?Discharge Instructions: Please refer to Patient Instructions section of EMR for full details.  Patient was counseled important signs  and symptoms that should prompt return to medical care, changes in medications, dietary instructions, activity restrictions, and follow up appointments.  ? ?Follow-Up Appointments: ?Future Appointments  ?Date Time Provider Department Center  ?03/21/2022  7:00 AM [facility] DEVICE REMOTES [name]  ?03/22/2022 10:30 AM [facility] PA/NP [facility] None  ?06/20/2022  7:00 AM [facility] DEVICE REMOTES [name]  ?09/19/2022  7:00 AM [facility] DEVICE REMOTES [name]  ?12/19/2022  7:00 AM [facility] DEVICE REMOTES [name]  ?03/20/2023  7:00 AM [facility] DEVICE REMOTES [name]  ?06/20/2023  7:05 AM [facility] DEVICE REMOTES [name]  ? ? ?Zhyon Antenucci, MD ?03/13/2022, 4:12 PM ?PGY-3, [facility] ? ?

## 2022-03-13 NOTE — Plan of Care (Signed)

## 2022-03-13 NOTE — Progress Notes (Signed)
Family Medicine Teaching Service ?Daily Progress Note ?Intern Pager: [phone] ? ?[name] Medical record number: [id number] ?Date of birth: Mar 16, 1951 Age: 71 y.o. Gender: female ? ?Primary Care Provider: [name], MD ?Consultants: Cardiology ?Code Status: DNR ? ?Pt Overview and Major Events to Date:  ?3/24: admitted ? ?Assessment and Plan: ?Misty Gray is a 71 y.o. female presenting with worsening shortness of breath, found to have acute systolic heart failure PMH significant for HFrEF, A-fib, COPD, OSA, history of seizure, sick sinus syndrome with pacemaker, nonischemic cardiomyopathy. ? ?Acute on chronic systolic CHF exacerbation ?Euvolemic and able to be discharged today.  On baseline 2 L nasal cannula. ?-Torsemide 40 mg twice daily, hold evening dose ?-Spironolactone 25 mg daily ?-Daily weights, strict I's and O's ?-PT/OT ? ?A-fib  sick sinus syndrome with pacemaker: Rate controlled, stable  electrolyte management ?Rate controlled in the 60s to 70s.  K2.9.  Goal K greater than 4 and mag greater than 2. ?-Amiodarone 200 mg twice daily ?-KCl 40 mEq x 2 ?-Daily BMP ? ?FEN/GI: Heart healthy ?PPx: Edoxaban ?Dispo:SNF today. Barriers include none.  Stable for discharge. ? ?Subjective:  ?Patient states she is doing well and continues to talk about her religion. ? ?Objective: ?Temp:  [97.6 ?F (36.4 ?C)-98.7 ?F (37.1 ?C)] 98.7 ?F (37.1 ?C) (03/29 1048) ?Pulse Rate:  [59-70] 70 (03/29 1048) ?Resp:  [16-18] 18 (03/29 1048) ?BP: (80-93)/(50-60) 88/55 (03/29 1048) ?SpO2:  [95 %-98 %] 95 % (03/29 1048) ?Weight:  [61.5 kg] 61.5 kg (03/29 0350) ?Physical Exam: ?General: Awake, alert, NAD ?Cardiovascular: Irregularly irregular, no murmurs auscultated ?Respiratory: CTAB, no increased work of breathing, 2 L [place] in place ?Extremities: No pitting edema present ? ?Laboratory: ?Recent Labs  ?Lab 03/10/22 ?0258 03/11/22 ?0433 03/12/22 ?[id number]  ?WBC 5.9 6.7 6.6  ?HGB 13.5 14.7 14.1  ?HCT 40.9 43.7 41.4   ?PLT 71* 79* 80*  ? ?Recent Labs  ?Lab 03/09/22 ?0227 03/10/22 ?0258 03/10/22 ?1038 03/11/22 ?0433 03/11/22 ?1625 03/11/22 ?2204 03/12/22 ?[id number] 03/13/22 ?0401  ?NA 138 134*   < > 135   < > 131* 132* 131*  ?K 3.7 2.2*   < > 2.6*   < > 4.0 3.1* 2.9*  ?CL 103 88*   < > 82*   < > 83* 84* 81*  ?CO2 29 38*   < > 42*   < > 40* 41* 39*  ?BUN 29* 31*   < > 30*   < > 38* 37* 44*  ?CREATININE 0.95 1.10*   < > 0.91   < > 1.20* 1.06* 1.11*  ?CALCIUM 9.3 9.2   < > 9.4   < > 9.1 9.0 9.3  ?PROT 6.0* 6.5  --  6.9  --   --   --   --   ?BILITOT 1.2 1.4*  --  1.3*  --   --   --   --   ?ALKPHOS 100 104  --  106  --   --   --   --   ?ALT 27 26  --  23  --   --   --   --   ?AST 20 19  --  22  --   --   --   --   ?GLUCOSE 105* 102*   < > 100*   < > 106* 97 92  ? < > = values in this interval not displayed.  ? ?Imaging/Diagnostic Tests: ?DG CHEST PORT 1 VIEW ? ?Result Date: 03/12/2022 ?  CLINICAL DATA:  Bilateral pleural effusion EXAM: PORTABLE CHEST 1 VIEW COMPARISON:  03/08/2022 FINDINGS: Cardiac enlargement. Bilateral airspace disease compatible with pulmonary edema which appears unchanged. Left greater than right pleural effusion and atelectasis also unchanged. Pacemaker unchanged. IMPRESSION: Congestive heart failure with pulmonary edema unchanged. Bilateral pleural effusion and atelectasis left greater than right unchanged. Electronically Signed   By: Franchot Gallo M.D.   On: 03/12/2022 16:07   ? ? ?Wells Guiles, DO ?03/13/2022, 2:51 PM ?PGY-1, [facility] ?[facility] Intern pager: [phone], text pages welcome ? ?

## 2022-03-13 NOTE — Telephone Encounter (Signed)
Pt currently inpatient and will be going to SNF at time of DC.  Will be discharged from [employer] program at this time- can re-evaluate enrollment when/if pt returns to living at home. ? ?Will continue to follow and assist as needed ? ?Burna Sis, LCSW ?Clinical Social Worker ?[facility] ?Desk#: [phone] ?Cell#: [phone] ? ?

## 2022-03-13 NOTE — Discharge Summary (Incomplete Revision)
Family Medicine Teaching Service ?Hospital Discharge Summary ? ?[name] Medical record number: [id number] ?Date of birth: Nov 18, 1951 Age: 71 y.o. Gender: female ?Date of Admission: 03/07/2022  Date of Discharge: 03/13/2022 ?Admitting Physician: Katha Cabal, DO ? ?Primary Care Provider: Latrelle Dodrill, MD ?Consultants: Heart failure ? ?Indication for Hospitalization: Acute on chronic systolic heart failure exacerbation ? ?Discharge Diagnoses/Problem List:  ?Principal Problem: ?  Acute CHF (congestive heart failure) (HCC) ?  ? ?Disposition: SNF ? ?Discharge Condition: Stable ? ?Discharge Exam:  ?Blood pressure (!) 88/55, pulse 70, temperature 98.7 ?F (37.1 ?C), temperature source Oral, resp. rate 18, height 4\' 9"  (1.448 m), weight 61.5 kg, SpO2 95 %.  ?General: Awake, alert, NAD ?Cardiovascular: Irregularly irregular, no murmurs auscultated ?Respiratory: CTAB, no increased work of breathing, 2 L [facility] in place ?Extremities: No pitting edema present ? ?Brief Hospital Course:  ?Misty Gray is a 71 y.o.female with a history of HFrEF, Afib, COPD, OSA, hx seizure, sick sinus syndrome w/ pacemaker, nonischemic cardiomyopathy who was admitted to the [facility] at [facility] for worsening shortness of breath. Her hospital course is detailed below: ? ?Acute on chronic systolic heart failure exacerbation ?Presented with shortness of breath x2 days and increased swelling of the legs with increased oxygen requirement as compared to 2 L [place] at home. Received Solu-Medrol 125 mg and albuterol prior to arrival. BNP 1900 with flat troponins. CXR showed moderate features of CHF with left-greater-than-right pleural effusions and overlying lung opacities.  Cardiology consulted and recommended IV Lasix and spironolactone. She was eventually started on metolazone due to poor diuresis response. Echo showed LVEF 20 to 25%, global hypokinesis, RV moderately enlarged, PAP moderately elevated, severely  dilated bilateral atria. She received 5 days of diuresis with a discharge weight of 61.5 kg down from 66.8 kg. Discharged with instructions to take torsemide 40mg  BID.  ? ?Hypokalemia ?Potassium monitored throughout hospitalization and replenished as necessary. Continued to be depleted by torsemide. Patient discharged with instructions to take 40 mEq of Kcl BID. ? ?Atrial fibrillation; sick sinus syndrome w/ pacemaker ?Patient rate-controlled throughout admission. Cardiology consulted and increased Amiodarone from 100mg  to 200mg  twice daily. ? ?Other chronic conditions were medically managed with home medications and formulary alternatives as necessary (OSA) ? ?PCP Follow-up Recommendations: ?Repeat BMP to ensure resolution of hypokalemia ?If ambulating well and no orthostatic symptoms, consider restarting low dose ARB ?Ensure follow up at [facility] ? ?Significant Procedures: None ? ?Significant Labs and Imaging:  ?Recent Labs  ?Lab 03/10/22 ?0258 03/11/22 ?0433 03/12/22 ?  ?WBC 5.9 6.7 6.6  ?HGB 13.5 14.7 14.1  ?HCT 40.9 43.7 41.4  ?PLT 71* 79* 80*  ? ?Recent Labs  ?Lab 03/08/22 ?0010 03/09/22 ?0227 03/10/22 ?0258 03/10/22 ?1038 03/11/22 ?0433 03/11/22 ?1625 03/11/22 ?2204 03/12/22 ?03/13/22 03/13/22 ?0401 03/13/22 ?1423  ?NA 136 138 134*   < > 135 132* 131* 132* 131* 132*  ?K 3.9 3.7 2.2*   < > 2.6* 3.1* 4.0 3.1* 2.9* 4.6  ?CL 100 103 88*   < > 82* 82* 83* 84* 81* 84*  ?CO2 23 29 38*   < > 42* 42* 40* 41* 39* 39*  ?GLUCOSE 131* 105* 102*   < > 100* 104* 106* 97 92 95  ?BUN 25* 29* 31*   < > 30* 32* 38* 37* 44* 40*  ?CREATININE 1.09* 0.95 1.10*   < > 0.91 0.99 1.20* 1.06* 1.11* 1.02*  ?CALCIUM 9.9 9.3 9.2   < > 9.4  9.3 9.1 9.0 9.3 9.3  ?MG  --  1.7 1.8  --  1.9  --   --  2.3  --   --   ?ALKPHOS 125 100 104  --  106  --   --   --   --   --   ?AST 33 20 19  --  22  --   --   --   --   --   ?ALT 40 27 26  --  23  --   --   --   --   --   ?ALBUMIN 3.4* 2.6* 2.8*  --  2.9*  --   --   --   --   --   ? < > =  values in this interval not displayed.  ? ?Results/Tests Pending at Time of Discharge: None ? ?Discharge Medications:  ?Allergies as of 03/13/2022   ? ?   Reactions  ? Aspirin Hives, Rash  ? Penicillins Rash, Other (See Comments)  ? Has patient had a PCN reaction causing immediate rash, facial/tongue/throat swelling, SOB or lightheadedness with hypotension: Yes ?Has patient had a PCN reaction causing severe rash involving mucus membranes or skin necrosis: No ?Has patient had a PCN reaction that required hospitalization No ?Has patient had a PCN reaction occurring within the last 10 years: Yes ?If all of the above answers are "NO", then may proceed with Cephalosporin use. ?Patient has tolerated cephalosporins in 2017.  ? Tape Other (See Comments)  ? Medical tape only  ? Wool Alcohol [lanolin] Hives  ? Amoxicillin Rash  ? Patient has tolerated cephalosporins  ? Ampicillin Itching, Rash  ? Patient has tolerated cephalosporins  ? ?  ? ?  ?Medication List  ?  ? ?STOP taking these medications   ? ?omeprazole 20 MG capsule ?Commonly known as: PRILOSEC ?  ?potassium chloride 10 MEQ tablet ?Commonly known as: KLOR-CON ?  ? ?  ? ?TAKE these medications   ? ?amiodarone 200 MG tablet ?Commonly known as: PACERONE ?Take 1 tablet (200 mg total) by mouth 2 (two) times daily. ?What changed:  ?how much to take ?when to take this ?  ?D3-1000 25 MCG (1000 UT) tablet ?Generic drug: Cholecalciferol ?TAKE 1 TABLET BY MOUTH DAILY (AM) ?What changed: See the new instructions. ?  ?edoxaban 30 MG Tabs tablet ?Commonly known as: SAVAYSA ?Take 1 tablet (30 mg total) by mouth daily. ?Start taking on: March 14, 2022 ?What changed:  ?medication strength ?See the new instructions. ?  ?FLUoxetine 10 MG tablet ?Commonly known as: PROZAC ?Take 10 mg by mouth daily. ?  ?fluticasone 50 MCG/ACT nasal spray ?Commonly known as: FLONASE ?Place 2 sprays into both nostrils daily as needed. For congestion. ?What changed:  ?reasons to take this ?additional  instructions ?  ?PHENobarbital 64.8 MG tablet ?Commonly known as: LUMINAL ?TAKE ONE TABLET BY MOUTH TWICE DAILY (AM+BEDTIME) ?What changed: See the new instructions. ?  ?spironolactone 25 MG tablet ?Commonly known as: ALDACTONE ?Take 1 tablet (25 mg total) by mouth daily. ?Start taking on: March 14, 2022 ?What changed:  ?how much to take ?how to take this ?when to take this ?additional instructions ?  ?Torsemide 40 MG Tabs ?Take 40 mg by mouth 2 (two) times daily. ?Start taking on: March 14, 2022 ?What changed:  ?medication strength ?See the new instructions. ?  ? ?  ? ? ?Discharge Instructions: Please refer to Patient Instructions section of EMR for full details.  Patient was counseled important signs  and symptoms that should prompt return to medical care, changes in medications, dietary instructions, activity restrictions, and follow up appointments.  ? ?Follow-Up Appointments: ?Future Appointments  ?Date Time Provider Department Center  ?03/21/2022  7:00 AM [facility] DEVICE REMOTES [name]  ?03/22/2022 10:30 AM [facility] PA/NP [facility] None  ?06/20/2022  7:00 AM [facility] DEVICE REMOTES [name]  ?09/19/2022  7:00 AM [facility] DEVICE REMOTES [name]  ?12/19/2022  7:00 AM [facility] DEVICE REMOTES [name]  ?03/20/2023  7:00 AM [facility] DEVICE REMOTES [name]  ?06/20/2023  7:05 AM [facility] DEVICE REMOTES [name]  ? ? ?Dana Allan, MD ?03/13/2022, 4:12 PM ?PGY-3, [facility] ? ?

## 2022-03-14 DIAGNOSIS — I959 Hypotension, unspecified: Secondary | ICD-10-CM | POA: Diagnosis not present

## 2022-03-14 DIAGNOSIS — M6281 Muscle weakness (generalized): Secondary | ICD-10-CM | POA: Diagnosis not present

## 2022-03-14 DIAGNOSIS — I4891 Unspecified atrial fibrillation: Secondary | ICD-10-CM | POA: Diagnosis not present

## 2022-03-14 DIAGNOSIS — I509 Heart failure, unspecified: Secondary | ICD-10-CM | POA: Diagnosis not present

## 2022-03-14 DIAGNOSIS — I5021 Acute systolic (congestive) heart failure: Secondary | ICD-10-CM | POA: Diagnosis not present

## 2022-03-14 DIAGNOSIS — R531 Weakness: Secondary | ICD-10-CM | POA: Diagnosis not present

## 2022-03-14 DIAGNOSIS — I428 Other cardiomyopathies: Secondary | ICD-10-CM | POA: Diagnosis not present

## 2022-03-14 DIAGNOSIS — I495 Sick sinus syndrome: Secondary | ICD-10-CM | POA: Diagnosis not present

## 2022-03-14 DIAGNOSIS — I5023 Acute on chronic systolic (congestive) heart failure: Secondary | ICD-10-CM | POA: Diagnosis not present

## 2022-03-14 DIAGNOSIS — J449 Chronic obstructive pulmonary disease, unspecified: Secondary | ICD-10-CM | POA: Diagnosis not present

## 2022-03-14 DIAGNOSIS — G40909 Epilepsy, unspecified, not intractable, without status epilepticus: Secondary | ICD-10-CM | POA: Diagnosis not present

## 2022-03-14 DIAGNOSIS — R2681 Unsteadiness on feet: Secondary | ICD-10-CM | POA: Diagnosis not present

## 2022-03-14 DIAGNOSIS — Z7401 Bed confinement status: Secondary | ICD-10-CM | POA: Diagnosis not present

## 2022-03-14 DIAGNOSIS — E871 Hypo-osmolality and hyponatremia: Secondary | ICD-10-CM | POA: Diagnosis not present

## 2022-03-14 DIAGNOSIS — R1312 Dysphagia, oropharyngeal phase: Secondary | ICD-10-CM | POA: Diagnosis not present

## 2022-03-14 DIAGNOSIS — D696 Thrombocytopenia, unspecified: Secondary | ICD-10-CM | POA: Diagnosis not present

## 2022-03-14 DIAGNOSIS — R2689 Other abnormalities of gait and mobility: Secondary | ICD-10-CM | POA: Diagnosis not present

## 2022-03-14 DIAGNOSIS — R41841 Cognitive communication deficit: Secondary | ICD-10-CM | POA: Diagnosis not present

## 2022-03-14 DIAGNOSIS — J9611 Chronic respiratory failure with hypoxia: Secondary | ICD-10-CM | POA: Diagnosis not present

## 2022-03-14 LAB — MAGNESIUM: Magnesium: 2.3 mg/dL (ref 1.7–2.4)

## 2022-03-14 LAB — BASIC METABOLIC PANEL
Anion gap: 8 (ref 5–15)
BUN: 38 mg/dL — ABNORMAL HIGH (ref 8–23)
CO2: 38 mmol/L — ABNORMAL HIGH (ref 22–32)
Calcium: 9.3 mg/dL (ref 8.9–10.3)
Chloride: 84 mmol/L — ABNORMAL LOW (ref 98–111)
Creatinine, Ser: 1.18 mg/dL — ABNORMAL HIGH (ref 0.44–1.00)
GFR, Estimated: 49 mL/min — ABNORMAL LOW (ref >60)
Glucose, Bld: 94 mg/dL (ref 70–99)
Potassium: 4.1 mmol/L (ref 3.5–5.1)
Sodium: 130 mmol/L — ABNORMAL LOW (ref 135–145)

## 2022-03-14 NOTE — Discharge Summary (Signed)
Family Medicine Teaching Service ?Hospital Discharge Summary ? ?[name] Medical record number: [id number] ?Date of birth: 10/24/1951 Age: 71 y.o. Gender: female ?Date of Admission: 03/07/2022  Date of Discharge: 03/14/2022 ?Admitting Physician: Katha Cabal, DO ? ?Primary Care Provider: Latrelle Dodrill, MD ?Consultants: Heart failure ? ?Indication for Hospitalization: Acute on chronic systolic heart failure exacerbation ? ?Discharge Diagnoses/Problem List:  ?Principal Problem: ?  Acute CHF (congestive heart failure) (HCC) ?  ? ?Disposition: SNF ? ?Discharge Condition: Stable ? ?Discharge Exam:  ?Blood pressure 97/68, pulse 68, temperature (!) 97.2 ?F (36.2 ?C), temperature source Oral, resp. rate 17, height 4\' 9"  (1.448 m), weight 61.5 kg, SpO2 100 %.  ?General: awake, alert, NAD ?Cardiovascular: irregularly irregular, no murmurs auscultated ?Respiratory: CTAB, no increased work of breathing, 2L [place] in place ?Extremities: no pitting edema present ? ?Brief Hospital Course:  ?Misty Gray is a 71 y.o.female with a history of HFrEF, Afib, COPD, OSA, hx seizure, sick sinus syndrome w/ pacemaker, nonischemic cardiomyopathy who was admitted to the [facility] Teaching Service at [facility] for worsening shortness of breath. Her hospital course is detailed below: ? ?Acute on chronic systolic heart failure exacerbation ?Presented with shortness of breath x2 days and increased swelling of the legs with increased oxygen requirement as compared to 2 L [place] at home. Received Solu-Medrol 125 mg and albuterol prior to arrival. BNP 1900 with flat troponins. CXR showed moderate features of CHF with left-greater-than-right pleural effusions and overlying lung opacities.  Cardiology consulted and recommended IV Lasix and spironolactone. She was eventually started on metolazone due to poor diuresis response. Echo showed LVEF 20 to 25%, global hypokinesis, RV moderately enlarged, PAP moderately elevated, severely  dilated bilateral atria. She received 5 days of diuresis with a discharge weight of 61.5 kg down from 66.8 kg. Discharged with instructions to take torsemide 40mg  BID.  ? ?Hypokalemia ?Potassium monitored throughout hospitalization and replenished as necessary. Continued to be depleted by torsemide. Patient discharged with instructions to take 40 mEq of Kcl BID. ? ?Atrial fibrillation; sick sinus syndrome w/ pacemaker ?Patient rate-controlled throughout admission. Cardiology consulted and increased Amiodarone from 100mg  to 200mg  twice daily. ? ?Other chronic conditions were medically managed with home medications and formulary alternatives as necessary (OSA) ? ?PCP Follow-up Recommendations: ?Repeat BMP to ensure resolution of hypokalemia ?If ambulating well and no orthostatic symptoms, consider restarting low dose ARB ?Ensure follow up at Heart Failure Clinic ? ?Significant Procedures: None ? ?Significant Labs and Imaging:  ?Recent Labs  ?Lab 03/10/22 ?0258 03/11/22 ?0433 03/12/22 ?  ?WBC 5.9 6.7 6.6  ?HGB 13.5 14.7 14.1  ?HCT 40.9 43.7 41.4  ?PLT 71* 79* 80*  ? ?Recent Labs  ?Lab 03/08/22 ?0010 03/08/22 ?0010 03/09/22 ?0227 03/10/22 ?0258 03/10/22 ?1038 03/11/22 ?0433 03/11/22 ?1625 03/11/22 ?2204 03/12/22 ?03/13/22 03/13/22 ?0401 03/13/22 ?1423 03/14/22 ?7824  ?NA 136  --  138 134*   < > 135   < > 131* 132* 131* 132* 130*  ?K 3.9  --  3.7 2.2*   < > 2.6*   < > 4.0 3.1* 2.9* 4.6 4.1  ?CL 100  --  103 88*   < > 82*   < > 83* 84* 81* 84* 84*  ?CO2 23  --  29 38*   < > 42*   < > 40* 41* 39* 39* 38*  ?GLUCOSE 131*  --  105* 102*   < > 100*   < > 106* 97 92 95 94  ?BUN  25*  --  29* 31*   < > 30*   < > 38* 37* 44* 40* 38*  ?CREATININE 1.09*  --  0.95 1.10*   < > 0.91   < > 1.20* 1.06* 1.11* 1.02* 1.18*  ?CALCIUM 9.9  --  9.3 9.2   < > 9.4   < > 9.1 9.0 9.3 9.3 9.3  ?MG  --    < > 1.7 1.8  --  1.9  --   --  2.3  --  2.1 2.3  ?ALKPHOS 125  --  100 104  --  106  --   --   --   --   --   --   ?AST 33  --  20 19  --  22  --    --   --   --   --   --   ?ALT 40  --  27 26  --  23  --   --   --   --   --   --   ?ALBUMIN 3.4*  --  2.6* 2.8*  --  2.9*  --   --   --   --   --   --   ? < > = values in this interval not displayed.  ? ?Results/Tests Pending at Time of Discharge: None ? ?Discharge Medications:  ?Allergies as of 03/14/2022   ? ?   Reactions  ? Aspirin Hives, Rash  ? Penicillins Rash, Other (See Comments)  ? Has patient had a PCN reaction causing immediate rash, facial/tongue/throat swelling, SOB or lightheadedness with hypotension: Yes ?Has patient had a PCN reaction causing severe rash involving mucus membranes or skin necrosis: No ?Has patient had a PCN reaction that required hospitalization No ?Has patient had a PCN reaction occurring within the last 10 years: Yes ?If all of the above answers are "NO", then may proceed with Cephalosporin use. ?Patient has tolerated cephalosporins in 2017.  ? Tape Other (See Comments)  ? Medical tape only  ? Wool Alcohol [lanolin] Hives  ? Amoxicillin Rash  ? Patient has tolerated cephalosporins  ? Ampicillin Itching, Rash  ? Patient has tolerated cephalosporins  ? ?  ? ?  ?Medication List  ?  ? ?STOP taking these medications   ? ?omeprazole 20 MG capsule ?Commonly known as: PRILOSEC ?  ?potassium chloride 10 MEQ tablet ?Commonly known as: KLOR-CON ?  ? ?  ? ?TAKE these medications   ? ?amiodarone 200 MG tablet ?Commonly known as: PACERONE ?Take 1 tablet (200 mg total) by mouth 2 (two) times daily. ?What changed:  ?how much to take ?when to take this ?  ?D3-1000 25 MCG (1000 UT) tablet ?Generic drug: Cholecalciferol ?TAKE 1 TABLET BY MOUTH DAILY (AM) ?What changed: See the new instructions. ?  ?edoxaban 30 MG Tabs tablet ?Commonly known as: SAVAYSA ?Take 1 tablet (30 mg total) by mouth daily. ?What changed:  ?medication strength ?See the new instructions. ?  ?FLUoxetine 10 MG tablet ?Commonly known as: PROZAC ?Take 10 mg by mouth daily. ?  ?fluticasone 50 MCG/ACT nasal spray ?Commonly known as:  FLONASE ?Place 2 sprays into both nostrils daily as needed. For congestion. ?What changed:  ?reasons to take this ?additional instructions ?  ?PHENobarbital 64.8 MG tablet ?Commonly known as: LUMINAL ?TAKE ONE TABLET BY MOUTH TWICE DAILY (AM+BEDTIME) ?What changed: See the new instructions. ?  ?potassium chloride SA 20 MEQ tablet ?Commonly known as: KLOR-CON M ?Take 2 tablets (40 mEq  total) by mouth 2 (two) times daily. ?  ?spironolactone 25 MG tablet ?Commonly known as: ALDACTONE ?Take 1 tablet (25 mg total) by mouth daily. ?What changed:  ?how much to take ?how to take this ?when to take this ?additional instructions ?  ?Torsemide 40 MG Tabs ?Take 40 mg by mouth 2 (two) times daily. ?What changed:  ?medication strength ?See the new instructions. ?  ? ?  ? ? ?Discharge Instructions: Please refer to Patient Instructions section of EMR for full details.  Patient was counseled important signs and symptoms that should prompt return to medical care, changes in medications, dietary instructions, activity restrictions, and follow up appointments.  ? ?Follow-Up Appointments: ?Future Appointments  ?Date Time Provider Department Center  ?03/21/2022  7:00 AM [facility] DEVICE REMOTES [name]  ?03/22/2022 10:30 AM [facility] PA/NP [facility] None  ?06/20/2022  7:00 AM [facility] DEVICE REMOTES [name]  ?09/19/2022  7:00 AM [facility] DEVICE REMOTES [name]  ?12/19/2022  7:00 AM [facility] DEVICE REMOTES [name]  ?03/20/2023  7:00 AM [facility] DEVICE REMOTES [name]  ?06/20/2023  7:05 AM [facility] DEVICE REMOTES [name]  ? ? ?Shelby Mattocks, DO ?03/14/2022, 8:39 AM ?PGY-1, [facility] ? ?

## 2022-03-14 NOTE — TOC Transition Note (Signed)
Transition of Care (TOC) - CM/SW Discharge Note ? ? ?Patient Details  ?Name: Misty Gray ?MRN: [id number] ?Date of Birth: [birth year] ? ?Transition of Care (TOC) CM/SW Contact:  ?Hason Ofarrell, LCSW ?Phone Number: ?03/14/2022, 10:01 AM ? ? ?Clinical Narrative:    ?Patient will DC to: [facility], SNF ?Anticipated DC date: 03/14/22 ?Family notified: yes, son Fayrene Fearing ?Transport by: Sharin Mons ? ? ?Per MD patient ready for DC to [facility], SNF. RN to call report prior to discharge [phone] room# [id number]). RN, patient, patient's family, and facility notified of DC. Discharge Summary and FL2 sent to facility. DC packet on chart. Ambulance transport requested for patient.  ? ?CSW will sign off for now as social work intervention is no longer needed. Please consult [place] again if new needs arise. ?  ? ? ?Final next level of care: Skilled Nursing Facility ?Barriers to Discharge: No Barriers Identified ? ? ?Patient Goals and CMS Choice ?Patient states their goals for this hospitalization and ongoing recovery are:: to return home after rehab ?CMS Medicare.gov Compare Post Acute Care list provided to:: Patient ?Choice offered to / list presented to : Patient ? ?Discharge Placement ?PASRR number recieved: 03/14/22 ?Existing PASRR number confirmed : 03/14/22          ?Patient [facility] bed at: [facility] ?Patient to be transferred to facility by: [facility] ?Name of family member notified: Alexsandria, [place] [phone] ?Patient and family notified of of transfer: 03/14/22 ? ?Discharge Plan and Services ?In-house Referral: Clinical Social Work ?Discharge Planning Services: CM Consult ?Post Acute Care Choice: Skilled Nursing Facility          ?  ?  ?  ?  ?  ?  ?  ?  ?  ?  ? ?Social Determinants of Health (SDOH) Interventions ?  ? ? ?Readmission Risk Interventions ?   ? View : No data to display.  ?  ?  ?  ? ? ? ?Hazelgrace Bonham, MSW, LCSW ?[phone] ?Heart Failure Social Worker  ? ?

## 2022-03-14 NOTE — Progress Notes (Signed)
Pt discharging to SNF, [facility].  ?Not able to reach a staff member to give report to, number provided to [facility] staff to give [facility] staff to call [facility] nurse for report.   ?Copy of instructions printed and sent with transporters for facility.  ?Pt d/c'd with belongings, transported by [facility].  ? ?

## 2022-03-14 NOTE — Progress Notes (Signed)
Attempted to call report to [facility] place, no answer after call was transferred over to receiving report nurse.   Will attempt again later.  ?

## 2022-03-14 NOTE — Progress Notes (Signed)
Occupational Therapy Treatment ?Patient Details ?Name: Misty Gray ?MRN: [id number] ?DOB: 06/03/1951 ?Today's Date: 03/14/2022 ? ? ?History of present illness 71 yo female admitted 3/23, presents with acute on chronic HF.  YQM:VHQIONG afib, s/p PPM, systolic HF (NICM) ?  ?OT comments ? Patient continues to make good progress with OT treatment. Patient was min guard for mobility in room with RW and able to stand to perform grooming.  Patient performed UB bathing and dressing seated due to fatigue.  Patient is expected to discharge to SNF today to continue with skilled OT to increase safety and independence with self care.   ? ?Recommendations for follow up therapy are one component of a multi-disciplinary discharge planning process, led by the attending physician.  Recommendations may be updated based on patient status, additional functional criteria and insurance authorization. ?   ?Follow Up Recommendations ? Skilled nursing-short term rehab (<3 hours/day)  ?  ?Assistance Recommended at Discharge Frequent or constant Supervision/Assistance  ?Patient can return home with the following ? A little help with walking and/or transfers;A lot of help with bathing/dressing/bathroom;Direct supervision/assist for medications management;Direct supervision/assist for financial management ?  ?Equipment Recommendations ? Other (comment) (TBD)  ?  ?Recommendations for Other Services   ? ?  ?Precautions / Restrictions Precautions ?Precautions: Fall ?Precaution Comments: on 2L O2 ?Restrictions ?Weight Bearing Restrictions: No  ? ? ?  ? ?Mobility Bed Mobility ?Overal bed mobility: Needs Assistance ?Bed Mobility: Supine to Sit ?Rolling: Min guard ?  ?Supine to sit: Min assist ?  ?  ?General bed mobility comments: min assist to scoot to EOB ?  ? ?Transfers ?Overall transfer level: Needs assistance ?Equipment used: Rolling walker (2 wheels) ?Transfers: Sit to/from Stand ?Sit to Stand: Min guard ?  ?  ?Step pivot transfers: Min guard ?  ?   ?General transfer comment: min guard with verbal cues for safety ?  ?  ?Balance Overall balance assessment: Needs assistance ?Sitting-balance support: Feet supported ?Sitting balance-Leahy Scale: Good ?  ?  ?Standing balance support: Bilateral upper extremity supported, No upper extremity supported, During functional activity ?Standing balance-Leahy Scale: Fair ?Standing balance comment: able to perform 2 handed tasks standing at sink ?  ?  ?  ?  ?  ?  ?  ?  ?  ?  ?  ?   ? ?ADL either performed or assessed with clinical judgement  ? ?ADL Overall ADL's : Needs assistance/impaired ?  ?  ?Grooming: Wash/dry hands;Wash/dry face;Oral care;Min guard;Standing ?Grooming Details (indicate cue type and reason): performed standing at sink ?Upper Body Bathing: Set up;Sitting ?  ?  ?  ?Upper Body Dressing : Set up;Sitting ?Upper Body Dressing Details (indicate cue type and reason): changed gown ?Lower Body Dressing: Moderate assistance;Bed level ?Lower Body Dressing Details (indicate cue type and reason): donned mesh panties prior to getting out of bed ?  ?  ?  ?  ?  ?  ?Functional mobility during ADLs: Min guard;Rolling walker (2 wheels) ?General ADL Comments: min guard with verbal cues for safety ?  ? ?Extremity/Trunk Assessment   ?  ?  ?  ?  ?  ? ?Vision   ?  ?  ?Perception   ?  ?Praxis   ?  ? ?Cognition Arousal/Alertness: Awake/alert ?Behavior During Therapy: [facility] for tasks assessed/performed ?Overall Cognitive Status: Within Functional Limits for tasks assessed ?  ?  ?  ?  ?  ?  ?  ?  ?  ?  ?  ?  ?  ?  ?  ?  ?  General Comments: patient difficult to understand at times. ?  ?  ?   ?Exercises   ? ?  ?Shoulder Instructions   ? ? ?  ?General Comments    ? ? ?Pertinent Vitals/ Pain       Pain Assessment ?Pain Assessment: No/denies pain ? ?Home Living   ?  ?  ?  ?  ?  ?  ?  ?  ?  ?  ?  ?  ?  ?  ?  ?  ?  ?  ? ?  ?Prior Functioning/Environment    ?  ?  ?  ?   ? ?Frequency ? Min 2X/week  ? ? ? ? ?  ?Progress Toward Goals ? ?OT  Goals(current goals can now be found in the care plan section) ? Progress towards OT goals: Progressing toward goals ? ?Acute Rehab OT Goals ?Patient Stated Goal: get better ?OT Goal Formulation: With patient ?Time For Goal Achievement: 03/23/22 ?Potential to Achieve Goals: Good ?ADL Goals ?Pt Will Perform Grooming: Independently ?Pt Will Perform Upper Body Bathing: with modified independence ?Pt Will Perform Lower Body Bathing: with set-up ?Pt Will Transfer to Toilet: with modified independence;ambulating  ?Plan Discharge plan remains appropriate   ? ?Co-evaluation ? ? ?   ?  ?  ?  ?  ? ?  ?AM-PAC OT "6 Clicks" Daily Activity     ?Outcome Measure ? ? Help from another person eating meals?: None ?Help from another person taking care of personal grooming?: A Little ?Help from another person toileting, which includes using toliet, bedpan, or urinal?: A Little ?Help from another person bathing (including washing, rinsing, drying)?: A Lot ?Help from another person to put on and taking off regular upper body clothing?: A Little ?Help from another person to put on and taking off regular lower body clothing?: A Lot ?6 Click Score: 17 ? ?  ?End of Session Equipment Utilized During Treatment: Gait belt;Rolling walker (2 wheels) ? ?OT Visit Diagnosis: Unsteadiness on feet (R26.81);Muscle weakness (generalized) (M62.81) ?  ?Activity Tolerance Patient tolerated treatment well ?  ?Patient Left in chair;with call bell/phone within reach;with chair alarm set ?  ?Nurse Communication Mobility status ?  ? ?   ? ?Time: 5537-4827 ?OT Time Calculation (min): 34 min ? ?Charges: OT General Charges ?$OT Visit: 1 Visit ?OT Treatments ?$Self Care/Home Management : 23-37 mins ? ?Alfonse Flavors, OTA ?Acute Rehabilitation Services  ?Pager [phone] ?Office [phone] ? ? ?Dewain Penning ?03/14/2022, 11:34 AM ?

## 2022-03-15 DIAGNOSIS — I4891 Unspecified atrial fibrillation: Secondary | ICD-10-CM | POA: Diagnosis not present

## 2022-03-15 DIAGNOSIS — I495 Sick sinus syndrome: Secondary | ICD-10-CM | POA: Diagnosis not present

## 2022-03-15 DIAGNOSIS — D696 Thrombocytopenia, unspecified: Secondary | ICD-10-CM | POA: Diagnosis not present

## 2022-03-15 DIAGNOSIS — I428 Other cardiomyopathies: Secondary | ICD-10-CM | POA: Diagnosis not present

## 2022-03-15 DIAGNOSIS — J9611 Chronic respiratory failure with hypoxia: Secondary | ICD-10-CM | POA: Diagnosis not present

## 2022-03-15 DIAGNOSIS — I5023 Acute on chronic systolic (congestive) heart failure: Secondary | ICD-10-CM | POA: Diagnosis not present

## 2022-03-15 DIAGNOSIS — G40909 Epilepsy, unspecified, not intractable, without status epilepticus: Secondary | ICD-10-CM | POA: Diagnosis not present

## 2022-03-15 DIAGNOSIS — J449 Chronic obstructive pulmonary disease, unspecified: Secondary | ICD-10-CM | POA: Diagnosis not present

## 2022-03-15 DIAGNOSIS — E871 Hypo-osmolality and hyponatremia: Secondary | ICD-10-CM | POA: Diagnosis not present

## 2022-03-21 ENCOUNTER — Ambulatory Visit (INDEPENDENT_AMBULATORY_CARE_PROVIDER_SITE_OTHER): Payer: Medicare HMO

## 2022-03-21 DIAGNOSIS — I495 Sick sinus syndrome: Secondary | ICD-10-CM

## 2022-03-22 ENCOUNTER — Ambulatory Visit (HOSPITAL_COMMUNITY)
Admit: 2022-03-22 | Discharge: 2022-03-22 | Disposition: A | Discharge status: Home / Self Care | Payer: Medicare HMO | Attending: Family Medicine | Admitting: Family Medicine

## 2022-03-22 ENCOUNTER — Encounter (HOSPITAL_COMMUNITY): Payer: Self-pay

## 2022-03-22 VITALS — BP 94/50 | HR 77 | Wt 151.0 lb

## 2022-03-22 DIAGNOSIS — I081 Rheumatic disorders of both mitral and tricuspid valves: Secondary | ICD-10-CM | POA: Diagnosis not present

## 2022-03-22 DIAGNOSIS — Z7901 Long term (current) use of anticoagulants: Secondary | ICD-10-CM | POA: Insufficient documentation

## 2022-03-22 DIAGNOSIS — I110 Hypertensive heart disease with heart failure: Secondary | ICD-10-CM | POA: Insufficient documentation

## 2022-03-22 DIAGNOSIS — Z79899 Other long term (current) drug therapy: Secondary | ICD-10-CM | POA: Insufficient documentation

## 2022-03-22 DIAGNOSIS — I4819 Other persistent atrial fibrillation: Secondary | ICD-10-CM

## 2022-03-22 DIAGNOSIS — I38 Endocarditis, valve unspecified: Secondary | ICD-10-CM

## 2022-03-22 DIAGNOSIS — I482 Chronic atrial fibrillation, unspecified: Secondary | ICD-10-CM | POA: Diagnosis not present

## 2022-03-22 DIAGNOSIS — Z139 Encounter for screening, unspecified: Secondary | ICD-10-CM

## 2022-03-22 DIAGNOSIS — I493 Ventricular premature depolarization: Secondary | ICD-10-CM | POA: Diagnosis not present

## 2022-03-22 DIAGNOSIS — I5082 Biventricular heart failure: Secondary | ICD-10-CM | POA: Insufficient documentation

## 2022-03-22 DIAGNOSIS — Z6832 Body mass index (BMI) 32.0-32.9, adult: Secondary | ICD-10-CM | POA: Insufficient documentation

## 2022-03-22 DIAGNOSIS — I472 Ventricular tachycardia, unspecified: Secondary | ICD-10-CM | POA: Insufficient documentation

## 2022-03-22 DIAGNOSIS — I872 Venous insufficiency (chronic) (peripheral): Secondary | ICD-10-CM | POA: Diagnosis not present

## 2022-03-22 DIAGNOSIS — G4733 Obstructive sleep apnea (adult) (pediatric): Secondary | ICD-10-CM | POA: Diagnosis not present

## 2022-03-22 DIAGNOSIS — J90 Pleural effusion, not elsewhere classified: Secondary | ICD-10-CM

## 2022-03-22 DIAGNOSIS — I495 Sick sinus syndrome: Secondary | ICD-10-CM | POA: Diagnosis not present

## 2022-03-22 DIAGNOSIS — Z950 Presence of cardiac pacemaker: Secondary | ICD-10-CM | POA: Diagnosis not present

## 2022-03-22 DIAGNOSIS — R569 Unspecified convulsions: Secondary | ICD-10-CM

## 2022-03-22 DIAGNOSIS — E876 Hypokalemia: Secondary | ICD-10-CM

## 2022-03-22 DIAGNOSIS — I428 Other cardiomyopathies: Secondary | ICD-10-CM | POA: Diagnosis not present

## 2022-03-22 DIAGNOSIS — G40909 Epilepsy, unspecified, not intractable, without status epilepticus: Secondary | ICD-10-CM | POA: Diagnosis not present

## 2022-03-22 DIAGNOSIS — I519 Heart disease, unspecified: Secondary | ICD-10-CM

## 2022-03-22 LAB — CUP PACEART REMOTE DEVICE CHECK
Battery Remaining Longevity: 137 mo
Battery Remaining Percentage: 95.5 %
Battery Voltage: 3.04 V
Brady Statistic RV Percent Paced: 2.6 %
Date Time Interrogation Session: 20230407102414
Implantable Lead Implant Date: 19920327
Implantable Lead Implant Date: 19920327
Implantable Lead Location: 753859
Implantable Lead Location: 753860
Implantable Pulse Generator Implant Date: 20220707
Lead Channel Impedance Value: 530 Ohm
Lead Channel Pacing Threshold Amplitude: 1.25 V
Lead Channel Pacing Threshold Pulse Width: 0.7 ms
Lead Channel Sensing Intrinsic Amplitude: 8.6 mV
Lead Channel Setting Pacing Amplitude: 2.5 V
Lead Channel Setting Pacing Pulse Width: 0.7 ms
Lead Channel Setting Sensing Sensitivity: 0.7 mV
Pulse Gen Model: 2272
Pulse Gen Serial Number: 3936106

## 2022-03-22 LAB — CBC
HCT: 38.7 % (ref 36.0–46.0)
Hemoglobin: 12.1 g/dL (ref 12.0–15.0)
MCH: 30 pg (ref 26.0–34.0)
MCHC: 31.3 g/dL (ref 30.0–36.0)
MCV: 95.8 fL (ref 80.0–100.0)
Platelets: UNDETERMINED 10*3/uL (ref 150–400)
RBC: 4.04 MIL/uL (ref 3.87–5.11)
RDW: 15.8 % — ABNORMAL HIGH (ref 11.5–15.5)
WBC: 6.6 10*3/uL (ref 4.0–10.5)
nRBC: 0 % (ref 0.0–0.2)

## 2022-03-22 LAB — BASIC METABOLIC PANEL
Anion gap: 7 (ref 5–15)
BUN: 29 mg/dL — ABNORMAL HIGH (ref 8–23)
CO2: 26 mmol/L (ref 22–32)
Calcium: 9.5 mg/dL (ref 8.9–10.3)
Chloride: 101 mmol/L (ref 98–111)
Creatinine, Ser: 1.04 mg/dL — ABNORMAL HIGH (ref 0.44–1.00)
GFR, Estimated: 57 mL/min — ABNORMAL LOW (ref >60)
Glucose, Bld: 81 mg/dL (ref 70–99)
Potassium: 4.9 mmol/L (ref 3.5–5.1)
Sodium: 134 mmol/L — ABNORMAL LOW (ref 135–145)

## 2022-03-22 LAB — MAGNESIUM: Magnesium: 2.5 mg/dL — ABNORMAL HIGH (ref 1.7–2.4)

## 2022-03-22 LAB — BRAIN NATRIURETIC PEPTIDE: B Natriuretic Peptide: 1643.6 pg/mL — ABNORMAL HIGH (ref 0.0–100.0)

## 2022-03-22 MED ORDER — TORSEMIDE 40 MG PO TABS
ORAL_TABLET | ORAL | 5 refills | Status: DC
Start: 2022-03-22 — End: 2022-04-01

## 2022-03-22 MED ORDER — AMIODARONE HCL 200 MG PO TABS: 200.0000 mg | ORAL_TABLET | Freq: Every day | ORAL | 0 refills | Status: DC

## 2022-03-22 NOTE — Progress Notes (Signed)
ReDS Vest / Clip - 03/22/22 1100   ? ?  ? ReDS Vest / Clip  ? Station Marker B   ? Ruler Value 27   ? ReDS Value Range Low volume   ? ReDS Actual Value 30   ? ?  ?  ? ?  ? ? ?

## 2022-03-22 NOTE — Progress Notes (Signed)
? ?  ?Advanced Heart Failure Clinic Note ? ?Heart Failure ?Date:  03/22/2022  ? ?ID:  TROYCE MEACHEM, DOB [birth year], MRN [id number]  Location: Home  ?Provider location: [facility] ?Type of Visit: Established patient ? ?PCP:  [name], MD- Internal Medicine   ?Cardiologist:  None ?Primary HF: Dr. Haroldine Laws ?EP: Dr Rayann Heman  ? ?Chief Complaint: F/u for Chronic Systolic Heart Failure  ?  ?History of Present Illness: ? ?[name] is a 71 y.o. woman with morbid obesity, OSA, chronic atrial fibrillation with tachy-brady syndrome s/p PPM, frequent PVCs, systolic HF due to NICM. ?  ?Admitted for ADHF in 10/17. Cath with normal coronary arteries and elevated filling pressures. Echo  EF 30-35% (previously 35-40%). Felt to have possible PVC or RV pacing cardiomyopathy. (She had 81% RV pacing and 15-20 PVCs per minute). Started on po amio with suppression of PVCs. Diuresed 41 pounds. Discharge weight 197.  ? ?Echo 8/18 LVEF 25-30%, Trivial AI, Mod MR, Severe LAE, Mild RV dilation, Mild RAE, PA peak pressure 31 mm Hg. ? ?Echo 1/20 EF 25-30% (no improvement with PVC suppression) ? ?Admitted [id number] with A/C systolic heart failure. Diuresed 21 pounds. RV [pacing 28% Decreased backup pacing rate to 50 bpm from 70 bpm.  ? ?Has been followed by Dr. Rayann Heman. At time of PM gen change < 20% RV bacing and QRS < 130 so felt not to be candidate for CRT upgrade. Patient did not want ICD.  ? ?Admitted 10/22 with ADHF. Echo EF 20-25%. RV moderately HK ? ?Follow up 2/23 feeling good with mild DOE if she does too much, volume stable on torsemide 80/60. Seen in ED 02/23/22 with syncope/? Seizure. Phenobarb levels ok, no electrolyte abnormalities. Received sutures to right eye brow. ? ?Admitted 3/23 with a/c dCHF and bilateral pleural effusions, had Diuresed with IV lasix and metolazone. Echo showed EF 20-25%, severe LV dysfunction with global HK, RV moderately reduced, moderate to severe MR, moderate TR. Tele  showed frequent PVCs and amiodarone increased to 200 bid. She remained a DNR. GDMT limited by low BP, discharged to SNF, weight 135 lbs. ? ?Studies: ?- Echo (3/23): EF 20-25%, severe LV dysfunction with global HK, RV moderately reduced, moderate to severe MR, moderate TR ? ?- Echo (7/21): EF 25-30% RV moderately reduced and enlarged.  ? ?- R/LHC (10/17): ?Ao = 94/58 (72) ?LV = 104/20 ?RA = 21 ?RV = 65/4/19 ?PA = 65/21 (38) ?PCW = 23 ?Fick cardiac output/index = 6.1/2.9 ?PVR = 2.5 WU ?FA sat = 97%  ?PA sat = 69%, 70%  ?Assessment: ?1. Normal coronary arteries ?2. NICM with EF 30-35% by echo ?3. Persistently elevated biventricular pressures ? ?Past Medical History:  ?Diagnosis Date  ? Abnormal CT of the head 12/16/1984  ? R parietal atrophy  ? Allergic rhinitis, cause unspecified   ? Altered mental status 11/28/2016  ? Anemia   ? Arthritis   ? "hands and knees" (09/06/2015)  ? Atrial fibrillation ([place])   ? Cellulitis 09/07/2015  ? Cellulitis and abscess of leg 09/2016  ? bilateral  ? CHF (congestive heart failure) ([place])   ? Diaphragmatic hernia without mention of obstruction or gangrene   ? Dyspnea   ? Exertional dyspnea 06/22/12  ? Family history of adverse reaction to anesthesia   ? "daughter fights w/them; just can't relax during OR; stay awake during the OR"  ? Fatigue 06/22/2012  ? Female stress incontinence   ? GERD (gastroesophageal reflux disease)   ?  Grand mal seizure [facility])   ? H/O hiatal hernia   ? two removed  ? Heart murmur   ? High cholesterol   ? History of blood transfusion 1956  ? "related to nose bleed"  ? Hypertension   ? Influenza A 01/11/2014  ? Lichenification and lichen simplex chronicus   ? Migraine   ? "when I was a teenager"  ? Morbid obesity ([place])   ? Nonischemic cardiomyopathy ([place])   ? EF has normalized-repeat Pending   ? On home oxygen therapy   ? "2L when I'm asleep in bed" (09/06/2015)  ? OSA on CPAP   ? Pacemaker  st judes   ? Patient states "this is my third pacemaker"  ? Pneumonia 2004; 2011;  11/2011  ? Seizures ([place]) since 1981  ? "can hear you talking but sounds like you are in big tunnel; have them often if not taking RX; last one was 06/17/12" (09/06/2015)  ? Shortness of breath 10/24/2010  ? Qualifier: Diagnosis of  By: [place], Bonnie    ? Sick sinus syndrome ([place])   ? WITH PRIOR DDD PACEMAKER IMPLANTATION  ? Skin irritation 07/23/2017  ? Somnolence   ? Stroke [facility]) 1988  ? "mouth drawed real bad on my left side; found out it was a seizure"  ? Syncope and collapse 06/22/12  ? "hit forehead and left knee"; denies loss of consciousness  ? Unspecified venous (peripheral) insufficiency   ? ?Past Surgical History:  ?Procedure Laterality Date  ? APPENDECTOMY    ? CARDIAC CATHETERIZATION N/A 10/03/2016  ? Procedure: Right/Left Heart Cath and Coronary Angiography;  Surgeon: Jolaine Artist, MD;  Location: [place] CV [facility];  Service: Cardiovascular;  Laterality: N/A;  ? HERNIA REPAIR  ? 1988; 04/15/1998  ? INSERT / REPLACE / REMOVE PACEMAKER  12/16/1990  ? DDD EF 30%;  ? INSERT / REPLACE / REMOVE PACEMAKER  07/25/2003  ? replaced by BB, leads are both IS1 and from 1992  ? INSERT / REPLACE / REMOVE PACEMAKER  05/21/2011  ? JA; generator change  ? LEG SKIN LESION  BIOPSY / EXCISION  05/16/2001  ? Lichen Planus  ? ORIF RADIAL FRACTURE Left 06/01/2021  ? Procedure: OPEN REDUCTION INTERNAL FIXATION (ORIF) RADIAL FRACTURE;  Surgeon: Charlotte Crumb, MD;  Location: [place];  Service: Orthopedics;  Laterality: Left;  AXILLIARY BLOCK  ? [place] N/A 06/21/2021  ? Procedure: PPM GENERATOR CHANGEOUT;  Surgeon: [name], MD;  Location: [place] [facility];  Service: Cardiovascular;  Laterality: N/A;  ? [place]; 04/15/1998  ? ?Current Outpatient Medications  ?Medication Sig Dispense Refill  ? amiodarone (PACERONE) 200 MG tablet Take 1 tablet (200 mg total) by mouth 2 (two) times daily. 60 tablet 0  ? D3-1000 25 MCG (1000 UT) tablet TAKE 1 TABLET BY MOUTH DAILY (AM) 28 tablet 0  ? edoxaban (SAVAYSA)  30 MG TABS tablet Take 1 tablet (30 mg total) by mouth daily. 60 tablet   ? FLUoxetine (PROZAC) 10 MG tablet Take 10 mg by mouth daily.    ? fluticasone (FLONASE) 50 MCG/ACT nasal spray Place 2 sprays into both nostrils daily as needed. For congestion. 16 g 1  ? PHENobarbital (LUMINAL) 64.8 MG tablet TAKE ONE TABLET BY MOUTH TWICE DAILY (AM+BEDTIME) 60 tablet 3  ? potassium chloride SA (KLOR-CON M) 20 MEQ tablet Take 2 tablets (40 mEq total) by mouth 2 (two) times daily. 120 tablet 0  ? spironolactone (ALDACTONE) 25 MG tablet  Take 1 tablet (25 mg total) by mouth daily.    ? torsemide 40 MG TABS Take 40 mg by mouth 2 (two) times daily.    ? ?No current facility-administered medications for this encounter.  ? ?Allergies:   Aspirin, Penicillins, Tape, Wool alcohol [lanolin], Amoxicillin, and Ampicillin  ? ?Social History:  The patient  reports that she quit smoking about 53 years ago. Her smoking use included cigarettes. She has a 7.00 pack-year smoking history. She has never used smokeless tobacco. She reports that she does not drink alcohol and does not use drugs.  ? ?Family History:  The patient's family history includes Cancer in her mother and sister; Diabetes in her son; Heart disease in her father; Hypertension in her brother and sister; Rheum arthritis in her daughter; Sleep apnea in her son; Stroke in her father.  ? ?ROS:  Please see the history of present illness.   All other systems are personally reviewed and negative.  ? ?Wt Readings from Last 3 Encounters:  ?03/22/22 68.5 kg  ?03/13/22 61.5 kg  ?02/23/22 66.8 kg  ? ?BP (!) 94/50   Pulse 77   Wt 68.5 kg   SpO2 99%   BMI 32.68 kg/m?  ? ?PHYSICAL EXAM: ?General:  NAD. No resp difficulty, arrived in [facility], elderly ?HEENT: edentulous, ecchymosis right eye brow ?Neck: Supple. No JVD. Carotids 2+ bilat; no bruits. No lymphadenopathy or thryomegaly appreciated. ?Cor: PMI nondisplaced. Irregular rate & rhythm. No rubs, gallops, or murmurs. ?Lungs: Diminished in  bases. ?Abdomen: Obese, soft, nontender, nondistended. No hepatosplenomegaly. No bruits or masses. Good bowel sounds. ?Extremities: No cyanosis, clubbing, rash, 1-2+ BLE edema to knees, chronic venous stasis

## 2022-03-22 NOTE — Patient Instructions (Addendum)
Thank you for coming in today ? ?Labs were done today, if any labs are abnormal the clinic will call you ?No news is good news ? ?PLEASE DRAW BMP and fax over to clinic in 1 week ? ?DECREASE Amiodarone to 200 mg 1 tablet daily  ? ?INCREASE Torsemide to 60 mg every morning and 40 mg every evening  ? ?PLEASE MAKE APPOINTMENT WITH PCP SOON ? ?Your physician recommends that you schedule a follow-up appointment in:  ?4 weeks in clinic  ?4 months with Dr. Haroldine Laws please call in MAY/June for August appointment ? ?At the [facility], you and your health needs are our priority. As part of our continuing mission to provide you with exceptional heart care, we have created designated Provider Care Teams. These Care Teams include your primary Cardiologist (physician) and Advanced Practice Providers (APPs- Physician Assistants and Nurse Practitioners) who all work together to provide you with the care you need, when you need it.  ? ?You may see any of the following providers on your designated Care Team at your next follow up: ?Dr Glori Bickers ?Dr Loralie Champagne ?Darrick Grinder, NP ?Lyda Jester, PA ?Jessica Milford,NP ?Marlyce Huge, PA ?Audry Riles, PharmD ? ? ?Please be sure to bring in all your medications bottles to every appointment.  ? ?If you have any questions or concerns before your next appointment please send [place] a message through [place] or call our office at [phone].   ? ?TO LEAVE A MESSAGE FOR THE NURSE SELECT OPTION 2, PLEASE LEAVE A MESSAGE INCLUDING: ?YOUR NAME ?DATE OF BIRTH ?CALL BACK NUMBER ?REASON FOR CALL**this is important as we prioritize the call backs ? ?YOU WILL RECEIVE A CALL BACK THE SAME DAY AS LONG AS YOU CALL BEFORE 4:00 PM ? ?

## 2022-03-31 NOTE — Progress Notes (Signed)
? ? ?  SUBJECTIVE:  ? ?CHIEF COMPLAINT / HPI: f/u following discharge from rehab  ? ?Patient was hospitalized for acute HF exacerbation where she was diuresed with metolazone and IV lasix. She was discharged on torsemide 40mg  BID.   ?She was in rehab facility until this past Thursday. She has been able to follow up in the HF clinic since leaving the SNF. Patient's son is present for this visit and states that they need help clarifying what medications the patient should be taking. He reports that she receives pill packs with her medications but is not sure these packs have been updated with the changes since she was discharged. Patient states that she is feeling well. She reports that her breathing feels well. She continues to use the 2 L of supplemental oxygen at nighttime that she has been on chronically. She states that she is getting set up with home health. She states that the swelling in her legs is improved and had her legs wrapped prior to discharge from the facility. Son states that she was previously prescribed Prozac but does not see this medication on her current list and is requesting a prescription for this.  ? ?PERTINENT  PMH / PSH:  ?HTN  ?Systolic dysfunction of LV  ?SA node dysfunction  ?Chronic AF  ?HF  ?OSA ?GERD ?Seizure D/O  ? ?OBJECTIVE:  ? ?BP 100/79   Pulse 80   Ht 4\' 9"  (1.448 m)   Wt 139 lb 3.2 oz (63.1 kg)   BMI 30.12 kg/m?   ?Physical Exam ?Constitutional:   ?   Appearance: She is normal weight.  ?HENT:  ?   Nose: Nose normal.  ?Cardiovascular:  ?   Rate and Rhythm: Normal rate. Rhythm irregular.  ?Pulmonary:  ?   Effort: Pulmonary effort is normal.  ?   Breath sounds: No stridor. No wheezing, rhonchi or rales.  ?Abdominal:  ?   General: Bowel sounds are normal.  ?   Tenderness: There is no abdominal tenderness.  ?   Hernia: A hernia is present.  ?Musculoskeletal:     ?   General: Swelling present.  ?   Cervical back: Normal range of motion.  ?Skin: ?   General: Skin is warm.   ?Neurological:  ?   General: No focal deficit present.  ?   Mental Status: She is alert and oriented to person, place, and time.  ? ? ?ASSESSMENT/PLAN:  ? ?Acute CHF (congestive heart failure) ([place]) ?Overall improved from time of hospitalization  ?Reviewed medication changes from discharge summary and recent HF clinic visit  ?Will obtain BMP today to check as she was hypokalemic in the hospital ?Will have patient take torsemide 60mg  in the AM and 40 in the evenings until follow up in 1 week  ? ?GASTROESOPHAGEAL REFLUX, NO ESOPHAGITIS ?Counseled patient to stop omeprazole per DC instructions from recent hospitalization ? ?Depressed mood ?Patient noted to have depressed mood following passing of her husband and was started on fluoxetine  ?- provided refills for this today  ?  ? ? ?Eulis Foster, MD ?[place]  ?

## 2022-04-01 ENCOUNTER — Ambulatory Visit (INDEPENDENT_AMBULATORY_CARE_PROVIDER_SITE_OTHER): Payer: Medicare PPO | Admitting: Family Medicine

## 2022-04-01 ENCOUNTER — Encounter: Payer: Self-pay | Admitting: Family Medicine

## 2022-04-01 VITALS — BP 100/79 | HR 80 | Ht <= 58 in | Wt 139.2 lb

## 2022-04-01 DIAGNOSIS — E876 Hypokalemia: Secondary | ICD-10-CM | POA: Diagnosis not present

## 2022-04-01 DIAGNOSIS — K219 Gastro-esophageal reflux disease without esophagitis: Secondary | ICD-10-CM

## 2022-04-01 DIAGNOSIS — R4589 Other symptoms and signs involving emotional state: Secondary | ICD-10-CM | POA: Diagnosis not present

## 2022-04-01 DIAGNOSIS — I5021 Acute systolic (congestive) heart failure: Secondary | ICD-10-CM

## 2022-04-01 MED ORDER — TORSEMIDE 40 MG PO TABS: ORAL_TABLET | ORAL | 0 refills | Status: DC

## 2022-04-01 MED ORDER — SPIRIVA HANDIHALER 18 MCG IN CAPS: 18.0000 ug | ORAL_CAPSULE | Freq: Every day | RESPIRATORY_TRACT | 0 refills | Status: DC

## 2022-04-01 MED ORDER — FLUOXETINE HCL 10 MG PO CAPS: 10.0000 mg | ORAL_CAPSULE | Freq: Every day | ORAL | 0 refills | Status: DC

## 2022-04-01 NOTE — Patient Instructions (Addendum)
Please stop taking the omeprazole as directed on your discharge summary from hospital. ? ?I have prescribed your Prozac and added it back to your medication list.  ? ?You will need to take the 60mg  of torsemide in the mornings and 40mg  of torsemide in the evenings. I have provided a refill for these tablets.  ? ?We will check blood work for your potassium levels today.  ? ?Please follow up in 1 week ?

## 2022-04-02 LAB — BASIC METABOLIC PANEL
BUN/Creatinine Ratio: 38 — ABNORMAL HIGH (ref 12–28)
BUN: 37 mg/dL — ABNORMAL HIGH (ref 8–27)
CO2: 36 mmol/L — ABNORMAL HIGH (ref 20–29)
Calcium: 10.5 mg/dL — ABNORMAL HIGH (ref 8.7–10.3)
Chloride: 90 mmol/L — ABNORMAL LOW (ref 96–106)
Creatinine, Ser: 0.98 mg/dL (ref 0.57–1.00)
Glucose: 105 mg/dL — ABNORMAL HIGH (ref 70–99)
Potassium: 4.1 mmol/L (ref 3.5–5.2)
Sodium: 145 mmol/L — ABNORMAL HIGH (ref 134–144)
eGFR: 61 mL/min/{1.73_m2} (ref >59)

## 2022-04-04 ENCOUNTER — Telehealth: Payer: Self-pay

## 2022-04-04 NOTE — Telephone Encounter (Signed)
Misty Gray [facility] PT calls nurse line requesting verbal orders for [facility] PT as follows.  ? ?2x a week for 4 weeks  ?1x a week for weeks ? ?Verbal order given. ? ?Misty Gray reports he reviewed her medications today and found she is taking Edoxaban 60mg  daily vs 30mg  daily noted on med list.  ? ? reports this may have been change at SNF.  ? ?Patient has a FU on 4/26. ? ?Will forward to PCP.  ?

## 2022-04-05 NOTE — Progress Notes (Signed)
Remote pacemaker transmission.   

## 2022-04-09 DIAGNOSIS — R4589 Other symptoms and signs involving emotional state: Secondary | ICD-10-CM | POA: Insufficient documentation

## 2022-04-09 NOTE — Assessment & Plan Note (Signed)
Counseled patient to stop omeprazole per DC instructions from recent hospitalization ?

## 2022-04-09 NOTE — Assessment & Plan Note (Signed)
Patient noted to have depressed mood following passing of her husband and was started on fluoxetine  ?- provided refills for this today  ?

## 2022-04-09 NOTE — Progress Notes (Signed)
? ? ?  SUBJECTIVE:  ? ?CHIEF COMPLAINT / HPI: f/u for diuresis  ? ?Patient presents for one week follow up after medication management changes for diuresis. She has been taking 60mg  torsemide in the AM and 40mg  in the evenings. She reprots that her breathing feels well. She is not having increased SOB. Her LE edema has continued to increase. She states that her home health provider has continued to wrap her legs but noticed increased swelling. Patient's son states that she is planning to move to [place] with her daughter so they are working to coordinate medication and find a new PCP.  Patient is also noted to have had weight gain from 139 to 145.  ? ?PERTINENT  PMH / PSH:  ?HF  ?HTN  ?SA node dysfunction ?AF(chronic anticoagulation) ?OSA ?GERD  ?Seizure D/O  ? ?OBJECTIVE:  ? ?BP (!) 110/91   Pulse 77   Wt 145 lb 9.6 oz (66 kg)   SpO2 100%   BMI 31.51 kg/m?   ?Physical Exam ?Constitutional:   ?   General: She is not in acute distress. ?   Appearance: Normal appearance. She is not ill-appearing or diaphoretic.  ?Cardiovascular:  ?   Rate and Rhythm: Normal rate and regular rhythm.  ?Pulmonary:  ?   Effort: Pulmonary effort is normal. No respiratory distress.  ?   Breath sounds: No wheezing or rales.  ?Abdominal:  ?   General: Bowel sounds are normal.  ?   Palpations: Abdomen is soft.  ?   Tenderness: There is no abdominal tenderness.  ?   Hernia: A hernia is present.  ?Musculoskeletal:  ?   Right lower leg: Edema present.  ?   Left lower leg: Edema present.  ?Neurological:  ?   Mental Status: She is alert and oriented to person, place, and time.  ? ? ?ASSESSMENT/PLAN:  ? ?Bilateral lower extremity edema ?Increased weight and LE edema bilaterally  ?Will increase dose from 60mg  and 40mg  to 60 mg BID for torsemide  ?Refills sent for 30 day supply  ?Will have patient follow up next week  ?Check BMP for K and creatinine today  ?  ? ? ? , MD ?[facility]  ?

## 2022-04-09 NOTE — Assessment & Plan Note (Addendum)
Overall improved from time of hospitalization  ?Reviewed medication changes from discharge summary and recent HF clinic visit  ?Will obtain BMP today to check as she was hypokalemic in the hospital ?Will have patient take torsemide 60mg  in the AM and 40 in the evenings until follow up in 1 week  ?

## 2022-04-10 ENCOUNTER — Telehealth: Payer: Self-pay

## 2022-04-10 ENCOUNTER — Ambulatory Visit (INDEPENDENT_AMBULATORY_CARE_PROVIDER_SITE_OTHER): Payer: Medicare PPO | Admitting: Family Medicine

## 2022-04-10 ENCOUNTER — Encounter: Payer: Self-pay | Admitting: Family Medicine

## 2022-04-10 VITALS — BP 110/91 | HR 77 | Wt 145.6 lb

## 2022-04-10 DIAGNOSIS — I519 Heart disease, unspecified: Secondary | ICD-10-CM | POA: Diagnosis not present

## 2022-04-10 DIAGNOSIS — R600 Localized edema: Secondary | ICD-10-CM | POA: Diagnosis not present

## 2022-04-10 MED ORDER — TORSEMIDE 20 MG PO TABS: 60.0000 mg | ORAL_TABLET | Freq: Two times a day (BID) | ORAL | 0 refills | Status: DC

## 2022-04-10 NOTE — Telephone Encounter (Signed)
[facility] OT calls nurse line requesting verbal orders for [facility] OT as follows.  ? ?1x a week for 5 weeks  ? ?Verbal order given.  ?

## 2022-04-10 NOTE — Telephone Encounter (Signed)
Pharmacy calls nurse line requesting a month supply of torsemide.  ? ?Will forward to provider who saw patient.  ?

## 2022-04-10 NOTE — Patient Instructions (Signed)
Please take 60 mg of your torsemide twice daily.  Please follow-up with our office on Monday, 04/15/2022. ? ?We will repeat your blood work today to assess for any electrolyte changes from your medication changes. ?

## 2022-04-11 ENCOUNTER — Other Ambulatory Visit: Payer: Self-pay | Admitting: Family Medicine

## 2022-04-11 LAB — BASIC METABOLIC PANEL
BUN/Creatinine Ratio: 25 (ref 12–28)
BUN: 28 mg/dL — ABNORMAL HIGH (ref 8–27)
CO2: 27 mmol/L (ref 20–29)
Calcium: 9.7 mg/dL (ref 8.7–10.3)
Chloride: 99 mmol/L (ref 96–106)
Creatinine, Ser: 1.12 mg/dL — ABNORMAL HIGH (ref 0.57–1.00)
Glucose: 125 mg/dL — ABNORMAL HIGH (ref 70–99)
Potassium: 4.9 mmol/L (ref 3.5–5.2)
Sodium: 140 mmol/L (ref 134–144)
eGFR: 52 mL/min/{1.73_m2} — ABNORMAL LOW (ref >59)

## 2022-04-11 MED ORDER — TORSEMIDE 20 MG PO TABS: 60.0000 mg | ORAL_TABLET | Freq: Two times a day (BID) | ORAL | 0 refills | Status: DC

## 2022-04-11 NOTE — Telephone Encounter (Signed)
30 day supply with 180 tablets for torsemide sent to pharmacy.  ? ?Ronnald Ramp, MD ?[facility], PGY-3 ?[phone]  ? ?

## 2022-04-16 ENCOUNTER — Other Ambulatory Visit: Payer: Self-pay | Admitting: Family Medicine

## 2022-04-16 ENCOUNTER — Ambulatory Visit (INDEPENDENT_AMBULATORY_CARE_PROVIDER_SITE_OTHER): Payer: Medicare PPO | Admitting: Student

## 2022-04-16 ENCOUNTER — Other Ambulatory Visit: Payer: Self-pay

## 2022-04-16 ENCOUNTER — Encounter: Payer: Self-pay | Admitting: Student

## 2022-04-16 VITALS — BP 111/77 | HR 70 | Ht <= 58 in | Wt 149.6 lb

## 2022-04-16 DIAGNOSIS — I519 Heart disease, unspecified: Secondary | ICD-10-CM | POA: Diagnosis not present

## 2022-04-16 DIAGNOSIS — I5021 Acute systolic (congestive) heart failure: Secondary | ICD-10-CM

## 2022-04-16 DIAGNOSIS — I482 Chronic atrial fibrillation, unspecified: Secondary | ICD-10-CM

## 2022-04-16 MED ORDER — POTASSIUM CHLORIDE CRYS ER 10 MEQ PO TBCR: EXTENDED_RELEASE_TABLET | ORAL | Status: AC

## 2022-04-16 MED ORDER — TORSEMIDE 20 MG PO TABS: ORAL_TABLET | ORAL | Status: AC

## 2022-04-16 NOTE — Patient Instructions (Signed)
It was great to see you! Thank you for allowing me to participate in your care! ? ?I recommend that you always bring your medications to each appointment as this makes it easy to ensure you are on the correct medications and helps [place] not miss when refills are needed. ? ?Our plans for today:  ?- Please schedule and appointment within 4 weeks for a follow up visit  ?-Continue taking your medications as prescribed, no changes were made today.  ? ?We are checking some labs today, I will call you if they are abnormal will send you a MyChart message or a letter if they are normal.  If you do not hear about your labs in the next 2 weeks please let us know. ? ?Take care and seek immediate care sooner if you develop any concerns.  ? ?Dr. Precious Gilding, DO ?[facility] ? ?

## 2022-04-16 NOTE — Progress Notes (Signed)
? ? ?  SUBJECTIVE:  ? ?CHIEF COMPLAINT / HPI:  ? ?F/u for diuresis ?Pt last seen on 4/26 with Dr. Neita Garnet who instructed her to take torsemide 60 mg BID. Per pts daughter (who was on the phone during visit), she has been giving her mother whats in the pill pack (80 mg Torsemide in morning and 60 mg in evening) plus torsemide 40 mg TID because that's what her brother told her to do. Pt states she is breathing well during the day, is using 2L O2 at night time. Hasn't notice a difference in her LE edema. Weight has increased by 4 lbs since last visit.  ?Pt now living with her daughter in [place] which she states is going well.  ? ?PERTINENT  PMH / PSH: Hypertension, chronic systolic heart failure, PVCs, A-fib ? ?OBJECTIVE:  ? ?Vitals:  ? 04/16/22 1348  ?BP: 111/77  ?Pulse: 70  ?SpO2: 94%  ? ? ?General: NAD, pleasant, able to participate in exam ?Cardiac: Irregularly irregular ?Respiratory: CTAB, normal effort, No wheezes, rales or rhonchi ?Extremities: 2+ pitting edema BLEs with erythema and scaling ?Skin: warm and dry ?Neuro: alert, no obvious focal deficits ?Psych: Normal affect and mood ? ?ASSESSMENT/PLAN:  ? ?Acute CHF (congestive heart failure) (HCC) ?Lower extremity edema appears to be unchanged from previous exam, there are no other signs of fluid overload on exam.  Patient has been taking much more torsemide than instructed at last visit as noted in HPI.  Son was able to join appointment and daughter was on speaker phone so we had a family meeting about medications.  For simplicity and as it will be a lower dose of torsemide that she was previously taking, patient is advised to take the torsemide as indicated in her pill pack.  This is torsemide 80 mg in the morning and 60 mg in the evening.  She is also advised to take the potassium in her pill pack which includes 40 mEq 3 times daily and 20 mEq at bedtime.  This was handwritten for her and her family on her AVS. ?-Take torsemide as indicated in  pill pack ?-Take potassium as indicated in pill pack ?-BMP today ?-Follow-up in 4 weeks ? ?Chronic atrial fibrillation (HCC) ?Patient follows with cardiology and takes amiodarone 200 mg daily and is prescribed edoxaban 30 mg daily.  However, when looking at patient's pill pack I noticed that the edoxaban in the pill pack is 60 mg daily.  Advised patient and family to cut this pill in half to get the appropriate dose.  We identified this pill as the only yellow pill in the pack. ?  ? ? ?Dr. Erick Alley, DO ?[facility]  ? ? ? ? ?

## 2022-04-16 NOTE — Assessment & Plan Note (Signed)
Patient follows with cardiology and takes amiodarone 200 mg daily and is prescribed edoxaban 30 mg daily.  However, when looking at patient's pill pack I noticed that the edoxaban in the pill pack is 60 mg daily.  Advised patient and family to cut this pill in half to get the appropriate dose.  We identified this pill as the only yellow pill in the pack. ?

## 2022-04-16 NOTE — Assessment & Plan Note (Signed)
Lower extremity edema appears to be unchanged from previous exam, there are no other signs of fluid overload on exam.  Patient has been taking much more torsemide than instructed at last visit as noted in HPI.  Son was able to join appointment and daughter was on speaker phone so we had a family meeting about medications.  For simplicity and as it will be a lower dose of torsemide that she was previously taking, patient is advised to take the torsemide as indicated in her pill pack.  This is torsemide 80 mg in the morning and 60 mg in the evening.  She is also advised to take the potassium in her pill pack which includes 40 mEq 3 times daily and 20 mEq at bedtime.  This was handwritten for her and her family on her AVS. ?-Take torsemide as indicated in pill pack ?-Take potassium as indicated in pill pack ?-BMP today ?-Follow-up in 4 weeks ?

## 2022-04-16 NOTE — Assessment & Plan Note (Addendum)
Increased weight and LE edema bilaterally  ?Will increase dose from 60mg  and 40mg  to 60 mg BID for torsemide  ?Refills sent for 30 day supply  ?Will have patient follow up next week  ?Check BMP for K and creatinine today  ?

## 2022-04-17 LAB — BASIC METABOLIC PANEL
BUN/Creatinine Ratio: 22 (ref 12–28)
BUN: 21 mg/dL (ref 8–27)
CO2: 28 mmol/L (ref 20–29)
Calcium: 9.5 mg/dL (ref 8.7–10.3)
Chloride: 95 mmol/L — ABNORMAL LOW (ref 96–106)
Creatinine, Ser: 0.95 mg/dL (ref 0.57–1.00)
Glucose: 94 mg/dL (ref 70–99)
Potassium: 5.1 mmol/L (ref 3.5–5.2)
Sodium: 136 mmol/L (ref 134–144)
eGFR: 64 mL/min/{1.73_m2} (ref >59)

## 2022-04-19 ENCOUNTER — Ambulatory Visit (HOSPITAL_COMMUNITY)
Admission: RE | Admit: 2022-04-19 | Discharge: 2022-04-19 | Disposition: A | Discharge status: Home / Self Care | Payer: Medicare PPO | Source: Ambulatory Visit | Attending: Family Medicine | Admitting: Family Medicine

## 2022-04-19 ENCOUNTER — Encounter (HOSPITAL_COMMUNITY): Payer: Self-pay

## 2022-04-19 ENCOUNTER — Telehealth: Payer: Self-pay | Admitting: Student

## 2022-04-19 VITALS — BP 104/68 | HR 100 | Wt 148.8 lb

## 2022-04-19 DIAGNOSIS — I482 Chronic atrial fibrillation, unspecified: Secondary | ICD-10-CM | POA: Insufficient documentation

## 2022-04-19 DIAGNOSIS — R569 Unspecified convulsions: Secondary | ICD-10-CM

## 2022-04-19 DIAGNOSIS — G4733 Obstructive sleep apnea (adult) (pediatric): Secondary | ICD-10-CM | POA: Insufficient documentation

## 2022-04-19 DIAGNOSIS — I509 Heart failure, unspecified: Secondary | ICD-10-CM

## 2022-04-19 DIAGNOSIS — I495 Sick sinus syndrome: Secondary | ICD-10-CM | POA: Insufficient documentation

## 2022-04-19 DIAGNOSIS — I872 Venous insufficiency (chronic) (peripheral): Secondary | ICD-10-CM | POA: Insufficient documentation

## 2022-04-19 DIAGNOSIS — I38 Endocarditis, valve unspecified: Secondary | ICD-10-CM | POA: Diagnosis not present

## 2022-04-19 DIAGNOSIS — I493 Ventricular premature depolarization: Secondary | ICD-10-CM

## 2022-04-19 DIAGNOSIS — Z66 Do not resuscitate: Secondary | ICD-10-CM | POA: Insufficient documentation

## 2022-04-19 DIAGNOSIS — I472 Ventricular tachycardia, unspecified: Secondary | ICD-10-CM | POA: Insufficient documentation

## 2022-04-19 DIAGNOSIS — Z79899 Other long term (current) drug therapy: Secondary | ICD-10-CM | POA: Insufficient documentation

## 2022-04-19 DIAGNOSIS — J90 Pleural effusion, not elsewhere classified: Secondary | ICD-10-CM | POA: Diagnosis not present

## 2022-04-19 DIAGNOSIS — G40909 Epilepsy, unspecified, not intractable, without status epilepticus: Secondary | ICD-10-CM | POA: Insufficient documentation

## 2022-04-19 DIAGNOSIS — I428 Other cardiomyopathies: Secondary | ICD-10-CM | POA: Diagnosis not present

## 2022-04-19 DIAGNOSIS — Z950 Presence of cardiac pacemaker: Secondary | ICD-10-CM | POA: Diagnosis not present

## 2022-04-19 DIAGNOSIS — Z6832 Body mass index (BMI) 32.0-32.9, adult: Secondary | ICD-10-CM | POA: Diagnosis not present

## 2022-04-19 DIAGNOSIS — I110 Hypertensive heart disease with heart failure: Secondary | ICD-10-CM | POA: Insufficient documentation

## 2022-04-19 DIAGNOSIS — I5042 Chronic combined systolic (congestive) and diastolic (congestive) heart failure: Secondary | ICD-10-CM | POA: Diagnosis not present

## 2022-04-19 DIAGNOSIS — I4819 Other persistent atrial fibrillation: Secondary | ICD-10-CM

## 2022-04-19 DIAGNOSIS — I519 Heart disease, unspecified: Secondary | ICD-10-CM

## 2022-04-19 MED ORDER — FLUOXETINE HCL 10 MG PO CAPS: 10.0000 mg | ORAL_CAPSULE | Freq: Every day | ORAL | 1 refills | Status: AC

## 2022-04-19 MED ORDER — AMIODARONE HCL 200 MG PO TABS: 200.0000 mg | ORAL_TABLET | Freq: Two times a day (BID) | ORAL | 6 refills | Status: AC

## 2022-04-19 NOTE — Addendum Note (Signed)
Encounter addended by: Jacklynn Ganong, FNP on: 04/19/2022 4:06 PM ? Actions taken: Clinical Note Signed

## 2022-04-19 NOTE — Telephone Encounter (Signed)
Attempted to call daughter and care giver, [name] again to discuss decreasing potassium. She did not pick up. Attempted to call patients son and left HIPAA compliant voicemail. Will try to reach them again later tonight.  ?

## 2022-04-19 NOTE — Patient Instructions (Signed)
Thank you for coming in today ? ?Your physician recommends that you return for lab work in: 7-10 days  ? ?Your physician recommends that you schedule a follow-up appointment in: 2-3 months with Dr. Gala Romney ? ?INCREASE Amiodarone to 200 mg 2 times day  ? ?At the [facility], you and your health needs are our priority. As part of our continuing mission to provide you with exceptional heart care, we have created designated Provider Care Teams. These Care Teams include your primary Cardiologist (physician) and Advanced Practice Providers (APPs- Physician Assistants and Nurse Practitioners) who all work together to provide you with the care you need, when you need it.  ? ?You may see any of the following providers on your designated Care Team at your next follow up: ?Dr Arvilla Meres ?Dr Marca Ancona ?Tonye Becket, NP ?Robbie Lis, PA ?Jessica Milford,NP ?Anna Genre, PA ?Karle Plumber, PharmD ? ? ?Please be sure to bring in all your medications bottles to every appointment.  ? ?If you have any questions or concerns before your next appointment please send [place] a message through [place] or call our office at [phone].   ? ?TO LEAVE A MESSAGE FOR THE NURSE SELECT OPTION 2, PLEASE LEAVE A MESSAGE INCLUDING: ?YOUR NAME ?DATE OF BIRTH ?CALL BACK NUMBER ?REASON FOR CALL**this is important as we prioritize the call backs ? ?YOU WILL RECEIVE A CALL BACK THE SAME DAY AS LONG AS YOU CALL BEFORE 4:00 PM ? ?

## 2022-04-19 NOTE — Telephone Encounter (Signed)
Attempted to call patient's daughter to discuss recent BMP results and to lower the amount of potassium pt is currently taking. Left HIPAA compliant message. Will attempt to call later this evening.  ?

## 2022-04-19 NOTE — Progress Notes (Addendum)
? ?  ?[facility] Note ? ?Heart Failure ?Date:  04/19/2022  ? ?ID:  Misty Gray, DOB [birth year], MRN [id number]  Location: Home  ?Provider location: [facility] ?Type of Visit: Established patient ? ?PCP:  Misty Rio, MD- Internal Medicine   ?EP: Dr. Rayann Gray  ?HF Cardiologist: Dr. Haroldine Gray ? ?Chief Complaint: F/u for Chronic Systolic Heart Failure  ?  ?HPI: ?[name] is a 71 y.o. woman with morbid obesity, OSA, chronic atrial fibrillation with tachy-brady syndrome s/p PPM, frequent PVCs, systolic HF due to NICM. ?  ?Admitted for ADHF in 10/17. Cath with normal coronary arteries and elevated filling pressures. Echo  EF 30-35% (previously 35-40%). Felt to have possible PVC or RV pacing cardiomyopathy. (She had 81% RV pacing and 15-20 PVCs per minute). Started on po amio with suppression of PVCs. Diuresed 41 pounds. Discharge weight 197.  ? ?Echo 8/18 LVEF 25-30%, Trivial AI, Mod MR, Severe LAE, Mild RV dilation, Mild RAE, PA peak pressure 31 mm Hg. ? ?Echo 1/20 EF 25-30% (no improvement with PVC suppression) ? ?Admitted [id number] with A/C systolic heart failure. Diuresed 21 pounds. RV [pacing 28% Decreased backup pacing rate to 50 bpm from 70 bpm.  ? ?Has been followed by Dr. Rayann Gray. At time of PM gen change < 20% RV bacing and QRS < 130 so felt not to be candidate for CRT upgrade. Patient did not want ICD.  ? ?Admitted 10/22 with ADHF. Echo EF 20-25%. RV moderately HK ? ?Seen in ED 02/23/22 with syncope/? Seizure. Phenobarb levels ok, no electrolyte abnormalities. Received sutures to right eye brow. ? ?Admitted 3/23 with a/c dCHF and bilateral pleural effusions, had Diuresed with IV lasix and metolazone. Echo showed EF 20-25%, severe LV dysfunction with global HK, RV moderately reduced, moderate to severe MR, moderate TR. Tele showed frequent PVCs and amiodarone increased to 200 bid. She remained a DNR. GDMT limited by low BP, discharged to SNF, weight 135  lbs. ? ?Today she returns for HF follow up with her son. Torsemide increased recently at PCP visit for LE swelling. She is now living in [place] with her daughter. Overall feeling fine. She has HH PT 2x/week and does not have SOB with this. Gets around [place] with her rolling walker. Denies abnormal bleeding, palpitations, CP, dizziness, or PND/Orthopnea. Appetite ok. No fever or chills. Weight at home 148 pounds. Taking all medications.  ? ?Cardiac Studies: ?- Echo (3/23): EF 20-25%, severe LV dysfunction with global HK, RV moderately reduced, moderate to severe MR, moderate TR ? ?- Echo (7/21): EF 25-30% RV moderately reduced and enlarged.  ? ?- R/LHC (10/17): ?Ao = 94/58 (72) ?LV = 104/20 ?RA = 21 ?RV = 65/4/19 ?PA = 65/21 (38) ?PCW = 23 ?Fick cardiac output/index = 6.1/2.9 ?PVR = 2.5 WU ?FA sat = 97%  ?PA sat = 69%, 70%  ?Assessment: ?1. Normal coronary arteries ?2. NICM with EF 30-35% by echo ?3. Persistently elevated biventricular pressures ? ?Past Medical History:  ?Diagnosis Date  ? Abnormal CT of the head 12/16/1984  ? R parietal atrophy  ? Allergic rhinitis, cause unspecified   ? Altered mental status 11/28/2016  ? Anemia   ? Arthritis   ? "hands and knees" (09/06/2015)  ? Atrial fibrillation ([place])   ? Cellulitis 09/07/2015  ? Cellulitis and abscess of leg 09/2016  ? bilateral  ? CHF (congestive heart failure) ([place])   ? Diaphragmatic hernia without mention of obstruction or gangrene   ?  Dyspnea   ? Exertional dyspnea 06/22/12  ? Family history of adverse reaction to anesthesia   ? "daughter fights w/them; just can't relax during OR; stay awake during the OR"  ? Fatigue 06/22/2012  ? Female stress incontinence   ? GERD (gastroesophageal reflux disease)   ? Grand mal seizure [facility])   ? H/O hiatal hernia   ? two removed  ? Heart murmur   ? High cholesterol   ? History of blood transfusion 1956  ? "related to nose bleed"  ? Hypertension   ? Influenza A 01/11/2014  ? Lichenification and lichen simplex chronicus   ? Migraine   ?  "when I was a teenager"  ? Morbid obesity ([place])   ? Nonischemic cardiomyopathy ([place])   ? EF has normalized-repeat Pending   ? On home oxygen therapy   ? "2L when I'm asleep in bed" (09/06/2015)  ? OSA on CPAP   ? Pacemaker  st judes   ? Patient states "this is my third pacemaker"  ? Pneumonia 2004; 2011; 11/2011  ? Seizures ([place]) since 1981  ? "can hear you talking but sounds like you are in big tunnel; have them often if not taking RX; last one was 06/17/12" (09/06/2015)  ? Shortness of breath 10/24/2010  ? Qualifier: Diagnosis of  By: Martin[place], Bonnie    ? Sick sinus syndrome ([place])   ? WITH PRIOR DDD PACEMAKER IMPLANTATION  ? Skin irritation 07/23/2017  ? Somnolence   ? Stroke [facility]) 1988  ? "mouth drawed real bad on my left side; found out it was a seizure"  ? Syncope and collapse 06/22/12  ? "hit forehead and left knee"; denies loss of consciousness  ? Unspecified venous (peripheral) insufficiency   ? ?Past Surgical History:  ?Procedure Laterality Date  ? APPENDECTOMY    ? CARDIAC CATHETERIZATION N/A 10/03/2016  ? Procedure: Right/Left Heart Cath and Coronary Angiography;  Surgeon: Misty Artist, MD;  Location: [facility];  Service: Cardiovascular;  Laterality: N/A;  ? HERNIA REPAIR  ? 1988; 04/15/1998  ? INSERT / REPLACE / REMOVE PACEMAKER  12/16/1990  ? DDD EF 30%;  ? INSERT / REPLACE / REMOVE PACEMAKER  07/25/2003  ? replaced by BB, leads are both IS1 and from 1992  ? INSERT / REPLACE / REMOVE PACEMAKER  05/21/2011  ? JA; generator change  ? LEG SKIN LESION  BIOPSY / EXCISION  05/16/2001  ? Lichen Planus  ? ORIF RADIAL FRACTURE Left 06/01/2021  ? Procedure: OPEN REDUCTION INTERNAL FIXATION (ORIF) RADIAL FRACTURE;  Surgeon: Misty Crumb, MD;  Location: [facility];  Service: Orthopedics;  Laterality: Left;  AXILLIARY BLOCK  ? [place] N/A 06/21/2021  ? Procedure: PPM GENERATOR CHANGEOUT;  Surgeon: Misty Grayer, MD;  Location: [facility];  Service: Cardiovascular;  Laterality: N/A;  ? [place]; 04/15/1998  ? ?Current Outpatient Medications  ?Medication Sig Dispense Refill  ? amiodarone (PACERONE) 200 MG tablet Take 100 mg by mouth daily.    ? D3-1000 25 MCG (1000 UT) tablet TAKE 1 TABLET BY MOUTH DAILY (AM) 28 tablet 0  ? edoxaban (SAVAYSA) 60 MG TABS tablet Take 30 mg by mouth daily.    ? FLUoxetine (PROZAC) 10 MG capsule Take 1 capsule (10 mg total) by mouth daily. 30 capsule 0  ? fluticasone (FLONASE) 50 MCG/ACT nasal spray Place 2 sprays into both nostrils daily as needed. For congestion. 16 g 1  ? PHENobarbital (LUMINAL) 64.8 MG  tablet TAKE ONE TABLET BY MOUTH TWICE DAILY (AM+BEDTIME) 60 tablet 3  ? potassium chloride (KLOR-CON M) 10 MEQ tablet Take four tablets 3 times a day and two tablets at bedtime    ? spironolactone (ALDACTONE) 25 MG tablet Take 1 tablet (25 mg total) by mouth daily.    ? torsemide (DEMADEX) 20 MG tablet Take 80 mg (4 tablets) in the morning and 60 mg (3 tablets) in the evening.    ? ?No current facility-administered medications for this encounter.  ? ?Allergies:   Aspirin, Penicillins, Tape, Wool alcohol [lanolin], Amoxicillin, and Ampicillin  ? ?Social History:  The patient  reports that she quit smoking about 53 years ago. Her smoking use included cigarettes. She has a 7.00 pack-year smoking history. She has never used smokeless tobacco. She reports that she does not drink alcohol and does not use drugs.  ? ?Family History:  The patient's family history includes Cancer in her mother and sister; Diabetes in her son; Heart disease in her father; Hypertension in her brother and sister; Rheum arthritis in her daughter; Sleep apnea in her son; Stroke in her father.  ? ?ROS:  Please see the history of present illness.   All other systems are personally reviewed and negative.  ? ?Wt Readings from Last 3 Encounters:  ?04/19/22 67.5 kg (148 lb 12.8 oz)  ?04/16/22 67.9 kg (149 lb 9.6 oz)  ?04/10/22 66 kg (145 lb 9.6 oz)  ? ?BP 104/68   Pulse 100   Wt 67.5 kg (148 lb 12.8  oz)   SpO2 97%   BMI 32.20 kg/m?  ? ?PHYSICAL EXAM: ?General:  NAD. No resp difficulty, elderly, walked into clinic with rolling walker. ?HEENT: edentulous ?Neck: Supple. No JVD. Carotids 2+ bilat; no bruits. N

## 2022-04-19 NOTE — Telephone Encounter (Signed)
Spoke on the phone with patient son. Advised that pt should only be taking 40 meq (4 tablets of the 10 meq) potassium once daily as her potassium came back as 5.1. We will plan to recheck a BMP at a lab only visit on Wed. 5/10. ?

## 2022-04-24 ENCOUNTER — Other Ambulatory Visit: Payer: Medicare PPO

## 2022-04-24 DIAGNOSIS — I509 Heart failure, unspecified: Secondary | ICD-10-CM | POA: Diagnosis not present

## 2022-04-25 ENCOUNTER — Telehealth: Payer: Self-pay

## 2022-04-25 DIAGNOSIS — I872 Venous insufficiency (chronic) (peripheral): Secondary | ICD-10-CM

## 2022-04-25 LAB — BASIC METABOLIC PANEL
BUN/Creatinine Ratio: 20 (ref 12–28)
BUN: 22 mg/dL (ref 8–27)
CO2: 26 mmol/L (ref 20–29)
Calcium: 9.6 mg/dL (ref 8.7–10.3)
Chloride: 97 mmol/L (ref 96–106)
Creatinine, Ser: 1.1 mg/dL — ABNORMAL HIGH (ref 0.57–1.00)
Glucose: 111 mg/dL — ABNORMAL HIGH (ref 70–99)
Potassium: 4.7 mmol/L (ref 3.5–5.2)
Sodium: 136 mmol/L (ref 134–144)
eGFR: 53 mL/min/{1.73_m2} — ABNORMAL LOW (ref >59)

## 2022-04-25 NOTE — Telephone Encounter (Signed)
Misty Gray [facility] PTA calls nurse line reporting venous ulcer.  ? ?Misty Gray reports ~ 4x4 in size. Patient denied any pain or signs of infection at home visit today.  ? ?Patient reported using triple antibiotic ointment on the area.  ? ?Misty Gray reports we can add HH nursing to patients plan for wound care.  ? ?I attempted to call patient to discuss and set up an apt, however no answer. VM left.  ? ?Will forward to PCP.  ?

## 2022-04-26 NOTE — Telephone Encounter (Signed)
Order placed. ?Thanks! ?Latrelle Dodrill, MD  ?

## 2022-04-26 NOTE — Telephone Encounter (Signed)
Earvin Hansen returned my call.  ? ?We need to place an order for [facility] nursing for wound care.  ? ?Once done I will fax to [phone]. ?

## 2022-04-26 NOTE — Telephone Encounter (Signed)
Agree with adding [place] wound care, and also with scheduling appointment. ? ?I would recommend against triple antibiotic ointment as a large subset of people have an allergy to the neomycin (neosporin) component. ? ?Instead could do bacitracin or polysporin. ? ?Please inform patient & authorize [facility] wound care orders. ? ?Thank you! ?[name], MD  ?

## 2022-04-26 NOTE — Telephone Encounter (Signed)
VM left with Earvin Hansen giving verbal authorization for [facility] nursing for wound care.  ? ?I called patient again to set up an apt. Patient denies any fevers, chills, body aches or madarous odor coming from site.  ? ?Patient reports she has to check with her boyfriend to see if he can bring her.  ? ?Patient tentatively scheduled for 5/15.  ? ?Patient advised to call the office is she can not make this.  ?

## 2022-04-27 ENCOUNTER — Telehealth: Payer: Self-pay | Admitting: Student

## 2022-04-27 NOTE — Telephone Encounter (Signed)
Called pt's son and left VM that labs on 5/10 looked good and to continue current medication regimen. Can get another BMP on Monday at her next visit with Dr. Robyne Peers.  ? ?Of note, pt's care taker is now her daughter which is who should be contacted however, her number is not in the chart and needs to be added.  ?

## 2022-04-28 DIAGNOSIS — I088 Other rheumatic multiple valve diseases: Secondary | ICD-10-CM | POA: Diagnosis not present

## 2022-04-28 DIAGNOSIS — I5043 Acute on chronic combined systolic (congestive) and diastolic (congestive) heart failure: Secondary | ICD-10-CM | POA: Diagnosis not present

## 2022-04-28 DIAGNOSIS — J90 Pleural effusion, not elsewhere classified: Secondary | ICD-10-CM | POA: Diagnosis not present

## 2022-04-28 DIAGNOSIS — I509 Heart failure, unspecified: Secondary | ICD-10-CM | POA: Diagnosis not present

## 2022-04-28 DIAGNOSIS — R059 Cough, unspecified: Secondary | ICD-10-CM | POA: Diagnosis not present

## 2022-04-28 DIAGNOSIS — R0602 Shortness of breath: Secondary | ICD-10-CM | POA: Diagnosis not present

## 2022-04-28 DIAGNOSIS — I5023 Acute on chronic systolic (congestive) heart failure: Secondary | ICD-10-CM | POA: Diagnosis not present

## 2022-04-28 DIAGNOSIS — E876 Hypokalemia: Secondary | ICD-10-CM | POA: Diagnosis not present

## 2022-04-28 DIAGNOSIS — N179 Acute kidney failure, unspecified: Secondary | ICD-10-CM | POA: Diagnosis not present

## 2022-04-28 DIAGNOSIS — J984 Other disorders of lung: Secondary | ICD-10-CM | POA: Diagnosis not present

## 2022-04-28 DIAGNOSIS — I110 Hypertensive heart disease with heart failure: Secondary | ICD-10-CM | POA: Diagnosis not present

## 2022-04-28 DIAGNOSIS — J9601 Acute respiratory failure with hypoxia: Secondary | ICD-10-CM | POA: Diagnosis not present

## 2022-04-28 DIAGNOSIS — I4891 Unspecified atrial fibrillation: Secondary | ICD-10-CM | POA: Diagnosis not present

## 2022-04-28 DIAGNOSIS — E871 Hypo-osmolality and hyponatremia: Secondary | ICD-10-CM | POA: Diagnosis not present

## 2022-04-28 DIAGNOSIS — J9621 Acute and chronic respiratory failure with hypoxia: Secondary | ICD-10-CM | POA: Diagnosis not present

## 2022-04-28 DIAGNOSIS — I482 Chronic atrial fibrillation, unspecified: Secondary | ICD-10-CM | POA: Diagnosis not present

## 2022-04-28 DIAGNOSIS — Z950 Presence of cardiac pacemaker: Secondary | ICD-10-CM | POA: Diagnosis not present

## 2022-04-28 DIAGNOSIS — R918 Other nonspecific abnormal finding of lung field: Secondary | ICD-10-CM | POA: Diagnosis not present

## 2022-04-29 ENCOUNTER — Ambulatory Visit: Payer: Medicare PPO | Admitting: Family Medicine

## 2022-04-30 NOTE — Telephone Encounter (Signed)
Orders faxed. However, appears current admission.  ?

## 2022-05-01 ENCOUNTER — Other Ambulatory Visit (HOSPITAL_COMMUNITY): Payer: Medicare PPO

## 2022-05-02 ENCOUNTER — Other Ambulatory Visit (HOSPITAL_COMMUNITY): Payer: Medicare PPO

## 2022-05-03 ENCOUNTER — Other Ambulatory Visit: Payer: Self-pay | Admitting: Family Medicine

## 2022-05-05 ENCOUNTER — Other Ambulatory Visit: Payer: Self-pay | Admitting: Family Medicine

## 2022-05-09 ENCOUNTER — Other Ambulatory Visit: Payer: Self-pay | Admitting: Family Medicine

## 2022-05-14 ENCOUNTER — Telehealth: Payer: Self-pay

## 2022-05-14 NOTE — Telephone Encounter (Signed)
Hope Budds [facility] skilled nursing calls nurse line requesting resumption of care VOs.   1x a week for 9 weeks  Verbal orders given.

## 2022-05-14 NOTE — Telephone Encounter (Signed)
Misty Gray calls back requesting a home health aide.  Verbal given for home health aide 1 week 3.

## 2022-05-15 DIAGNOSIS — I872 Venous insufficiency (chronic) (peripheral): Secondary | ICD-10-CM | POA: Diagnosis not present

## 2022-05-15 DIAGNOSIS — J810 Acute pulmonary edema: Secondary | ICD-10-CM | POA: Diagnosis not present

## 2022-05-15 DIAGNOSIS — K219 Gastro-esophageal reflux disease without esophagitis: Secondary | ICD-10-CM | POA: Diagnosis not present

## 2022-05-15 DIAGNOSIS — J9601 Acute respiratory failure with hypoxia: Secondary | ICD-10-CM | POA: Diagnosis not present

## 2022-05-15 DIAGNOSIS — M79671 Pain in right foot: Secondary | ICD-10-CM | POA: Diagnosis not present

## 2022-05-15 DIAGNOSIS — I87332 Chronic venous hypertension (idiopathic) with ulcer and inflammation of left lower extremity: Secondary | ICD-10-CM | POA: Diagnosis not present

## 2022-05-15 DIAGNOSIS — I495 Sick sinus syndrome: Secondary | ICD-10-CM | POA: Diagnosis not present

## 2022-05-15 DIAGNOSIS — I110 Hypertensive heart disease with heart failure: Secondary | ICD-10-CM | POA: Diagnosis not present

## 2022-05-15 DIAGNOSIS — J9811 Atelectasis: Secondary | ICD-10-CM | POA: Diagnosis not present

## 2022-05-15 DIAGNOSIS — I5023 Acute on chronic systolic (congestive) heart failure: Secondary | ICD-10-CM | POA: Diagnosis not present

## 2022-05-15 DIAGNOSIS — I4891 Unspecified atrial fibrillation: Secondary | ICD-10-CM | POA: Diagnosis not present

## 2022-05-15 DIAGNOSIS — J90 Pleural effusion, not elsewhere classified: Secondary | ICD-10-CM | POA: Diagnosis not present

## 2022-05-15 DIAGNOSIS — I454 Nonspecific intraventricular block: Secondary | ICD-10-CM | POA: Diagnosis not present

## 2022-05-15 DIAGNOSIS — R0602 Shortness of breath: Secondary | ICD-10-CM | POA: Diagnosis not present

## 2022-05-15 DIAGNOSIS — I509 Heart failure, unspecified: Secondary | ICD-10-CM | POA: Diagnosis not present

## 2022-05-15 DIAGNOSIS — I482 Chronic atrial fibrillation, unspecified: Secondary | ICD-10-CM | POA: Diagnosis not present

## 2022-05-15 DIAGNOSIS — Z7901 Long term (current) use of anticoagulants: Secondary | ICD-10-CM | POA: Diagnosis not present

## 2022-05-15 DIAGNOSIS — N179 Acute kidney failure, unspecified: Secondary | ICD-10-CM | POA: Diagnosis not present

## 2022-05-15 DIAGNOSIS — I5082 Biventricular heart failure: Secondary | ICD-10-CM | POA: Diagnosis not present

## 2022-05-15 DIAGNOSIS — Z66 Do not resuscitate: Secondary | ICD-10-CM | POA: Diagnosis not present

## 2022-05-15 DIAGNOSIS — I878 Other specified disorders of veins: Secondary | ICD-10-CM | POA: Diagnosis not present

## 2022-05-15 DIAGNOSIS — I428 Other cardiomyopathies: Secondary | ICD-10-CM | POA: Diagnosis not present

## 2022-05-20 ENCOUNTER — Ambulatory Visit: Payer: Medicare PPO | Admitting: Family Medicine

## 2022-05-21 ENCOUNTER — Other Ambulatory Visit (HOSPITAL_COMMUNITY): Payer: Self-pay

## 2022-05-22 DIAGNOSIS — I4891 Unspecified atrial fibrillation: Secondary | ICD-10-CM | POA: Diagnosis not present

## 2022-05-22 DIAGNOSIS — I5020 Unspecified systolic (congestive) heart failure: Secondary | ICD-10-CM | POA: Diagnosis not present

## 2022-05-23 ENCOUNTER — Telehealth: Payer: Self-pay

## 2022-05-23 NOTE — Telephone Encounter (Signed)
Kiki [facility] RN calls nurse line requesting verbal orders for skilled nursing.   1 week 1  Verbal order given.

## 2022-05-28 ENCOUNTER — Telehealth: Payer: Self-pay

## 2022-05-28 NOTE — Telephone Encounter (Signed)
Ron [facility] PT calls nurse line requesting verbal orders for [facility] PT as follows.   1x a week for 1 week  2x a week for 1 week   Verbal order given.

## 2022-05-29 ENCOUNTER — Telehealth: Payer: Self-pay

## 2022-05-29 NOTE — Telephone Encounter (Signed)
Dani from [facility] calling for nursing verbal orders as follows:  1 time(s) weekly for 8 week(s).  RN is also requesting verbal orders for xeroform and gauze for skin tear on arm  Verbal orders given per [facility] protocol  Veronda Prude, RN

## 2022-05-31 DIAGNOSIS — J984 Other disorders of lung: Secondary | ICD-10-CM | POA: Diagnosis not present

## 2022-05-31 DIAGNOSIS — I2781 Cor pulmonale (chronic): Secondary | ICD-10-CM | POA: Diagnosis not present

## 2022-05-31 DIAGNOSIS — Z743 Need for continuous supervision: Secondary | ICD-10-CM | POA: Diagnosis not present

## 2022-05-31 DIAGNOSIS — E8770 Fluid overload, unspecified: Secondary | ICD-10-CM | POA: Diagnosis not present

## 2022-05-31 DIAGNOSIS — I2699 Other pulmonary embolism without acute cor pulmonale: Secondary | ICD-10-CM | POA: Diagnosis not present

## 2022-05-31 DIAGNOSIS — R0602 Shortness of breath: Secondary | ICD-10-CM | POA: Diagnosis not present

## 2022-05-31 DIAGNOSIS — F32A Depression, unspecified: Secondary | ICD-10-CM | POA: Diagnosis not present

## 2022-05-31 DIAGNOSIS — Z452 Encounter for adjustment and management of vascular access device: Secondary | ICD-10-CM | POA: Diagnosis not present

## 2022-05-31 DIAGNOSIS — J9601 Acute respiratory failure with hypoxia: Secondary | ICD-10-CM | POA: Diagnosis not present

## 2022-05-31 DIAGNOSIS — I5023 Acute on chronic systolic (congestive) heart failure: Secondary | ICD-10-CM | POA: Diagnosis not present

## 2022-05-31 DIAGNOSIS — D649 Anemia, unspecified: Secondary | ICD-10-CM | POA: Diagnosis not present

## 2022-05-31 DIAGNOSIS — R069 Unspecified abnormalities of breathing: Secondary | ICD-10-CM | POA: Diagnosis not present

## 2022-05-31 DIAGNOSIS — R0902 Hypoxemia: Secondary | ICD-10-CM | POA: Diagnosis not present

## 2022-05-31 DIAGNOSIS — K5900 Constipation, unspecified: Secondary | ICD-10-CM | POA: Diagnosis not present

## 2022-05-31 DIAGNOSIS — J939 Pneumothorax, unspecified: Secondary | ICD-10-CM | POA: Diagnosis not present

## 2022-05-31 DIAGNOSIS — Z515 Encounter for palliative care: Secondary | ICD-10-CM | POA: Diagnosis not present

## 2022-05-31 DIAGNOSIS — J95811 Postprocedural pneumothorax: Secondary | ICD-10-CM | POA: Diagnosis not present

## 2022-05-31 DIAGNOSIS — I82442 Acute embolism and thrombosis of left tibial vein: Secondary | ICD-10-CM | POA: Diagnosis not present

## 2022-05-31 DIAGNOSIS — I482 Chronic atrial fibrillation, unspecified: Secondary | ICD-10-CM | POA: Diagnosis not present

## 2022-05-31 DIAGNOSIS — I428 Other cardiomyopathies: Secondary | ICD-10-CM | POA: Diagnosis not present

## 2022-05-31 DIAGNOSIS — J918 Pleural effusion in other conditions classified elsewhere: Secondary | ICD-10-CM | POA: Diagnosis not present

## 2022-05-31 DIAGNOSIS — R112 Nausea with vomiting, unspecified: Secondary | ICD-10-CM | POA: Diagnosis not present

## 2022-05-31 DIAGNOSIS — R918 Other nonspecific abnormal finding of lung field: Secondary | ICD-10-CM | POA: Diagnosis not present

## 2022-05-31 DIAGNOSIS — Z4682 Encounter for fitting and adjustment of non-vascular catheter: Secondary | ICD-10-CM | POA: Diagnosis not present

## 2022-05-31 DIAGNOSIS — I5189 Other ill-defined heart diseases: Secondary | ICD-10-CM | POA: Diagnosis not present

## 2022-05-31 DIAGNOSIS — J9621 Acute and chronic respiratory failure with hypoxia: Secondary | ICD-10-CM | POA: Diagnosis not present

## 2022-05-31 DIAGNOSIS — J9811 Atelectasis: Secondary | ICD-10-CM | POA: Diagnosis not present

## 2022-05-31 DIAGNOSIS — J90 Pleural effusion, not elsewhere classified: Secondary | ICD-10-CM | POA: Diagnosis not present

## 2022-05-31 DIAGNOSIS — R0989 Other specified symptoms and signs involving the circulatory and respiratory systems: Secondary | ICD-10-CM | POA: Diagnosis not present

## 2022-05-31 DIAGNOSIS — I517 Cardiomegaly: Secondary | ICD-10-CM | POA: Diagnosis not present

## 2022-05-31 DIAGNOSIS — G40909 Epilepsy, unspecified, not intractable, without status epilepticus: Secondary | ICD-10-CM | POA: Diagnosis not present

## 2022-05-31 DIAGNOSIS — R0600 Dyspnea, unspecified: Secondary | ICD-10-CM | POA: Diagnosis not present

## 2022-05-31 DIAGNOSIS — I82411 Acute embolism and thrombosis of right femoral vein: Secondary | ICD-10-CM | POA: Diagnosis not present

## 2022-05-31 DIAGNOSIS — I519 Heart disease, unspecified: Secondary | ICD-10-CM | POA: Diagnosis not present

## 2022-05-31 DIAGNOSIS — I4821 Permanent atrial fibrillation: Secondary | ICD-10-CM | POA: Diagnosis not present

## 2022-05-31 DIAGNOSIS — Z950 Presence of cardiac pacemaker: Secondary | ICD-10-CM | POA: Diagnosis not present

## 2022-06-05 DIAGNOSIS — K651 Peritoneal abscess: Secondary | ICD-10-CM | POA: Diagnosis not present

## 2022-06-15 DEATH — deceased

## 2022-06-24 ENCOUNTER — Inpatient Hospital Stay: Payer: Medicare PPO | Admitting: Family Medicine

## 2022-07-29 ENCOUNTER — Encounter (HOSPITAL_COMMUNITY): Payer: Medicare PPO | Admitting: Internal Medicine

## 2022-12-21 IMAGING — CR DG CHEST 2V
2 series · 2 of 2 positions shown · non-contrast
Comparison: Chest radiograph dated 07/13/2020.

CLINICAL DATA: 69-year-old female with shortness of breath.

EXAM:
CHEST - 2 VIEW

[w chest pa]
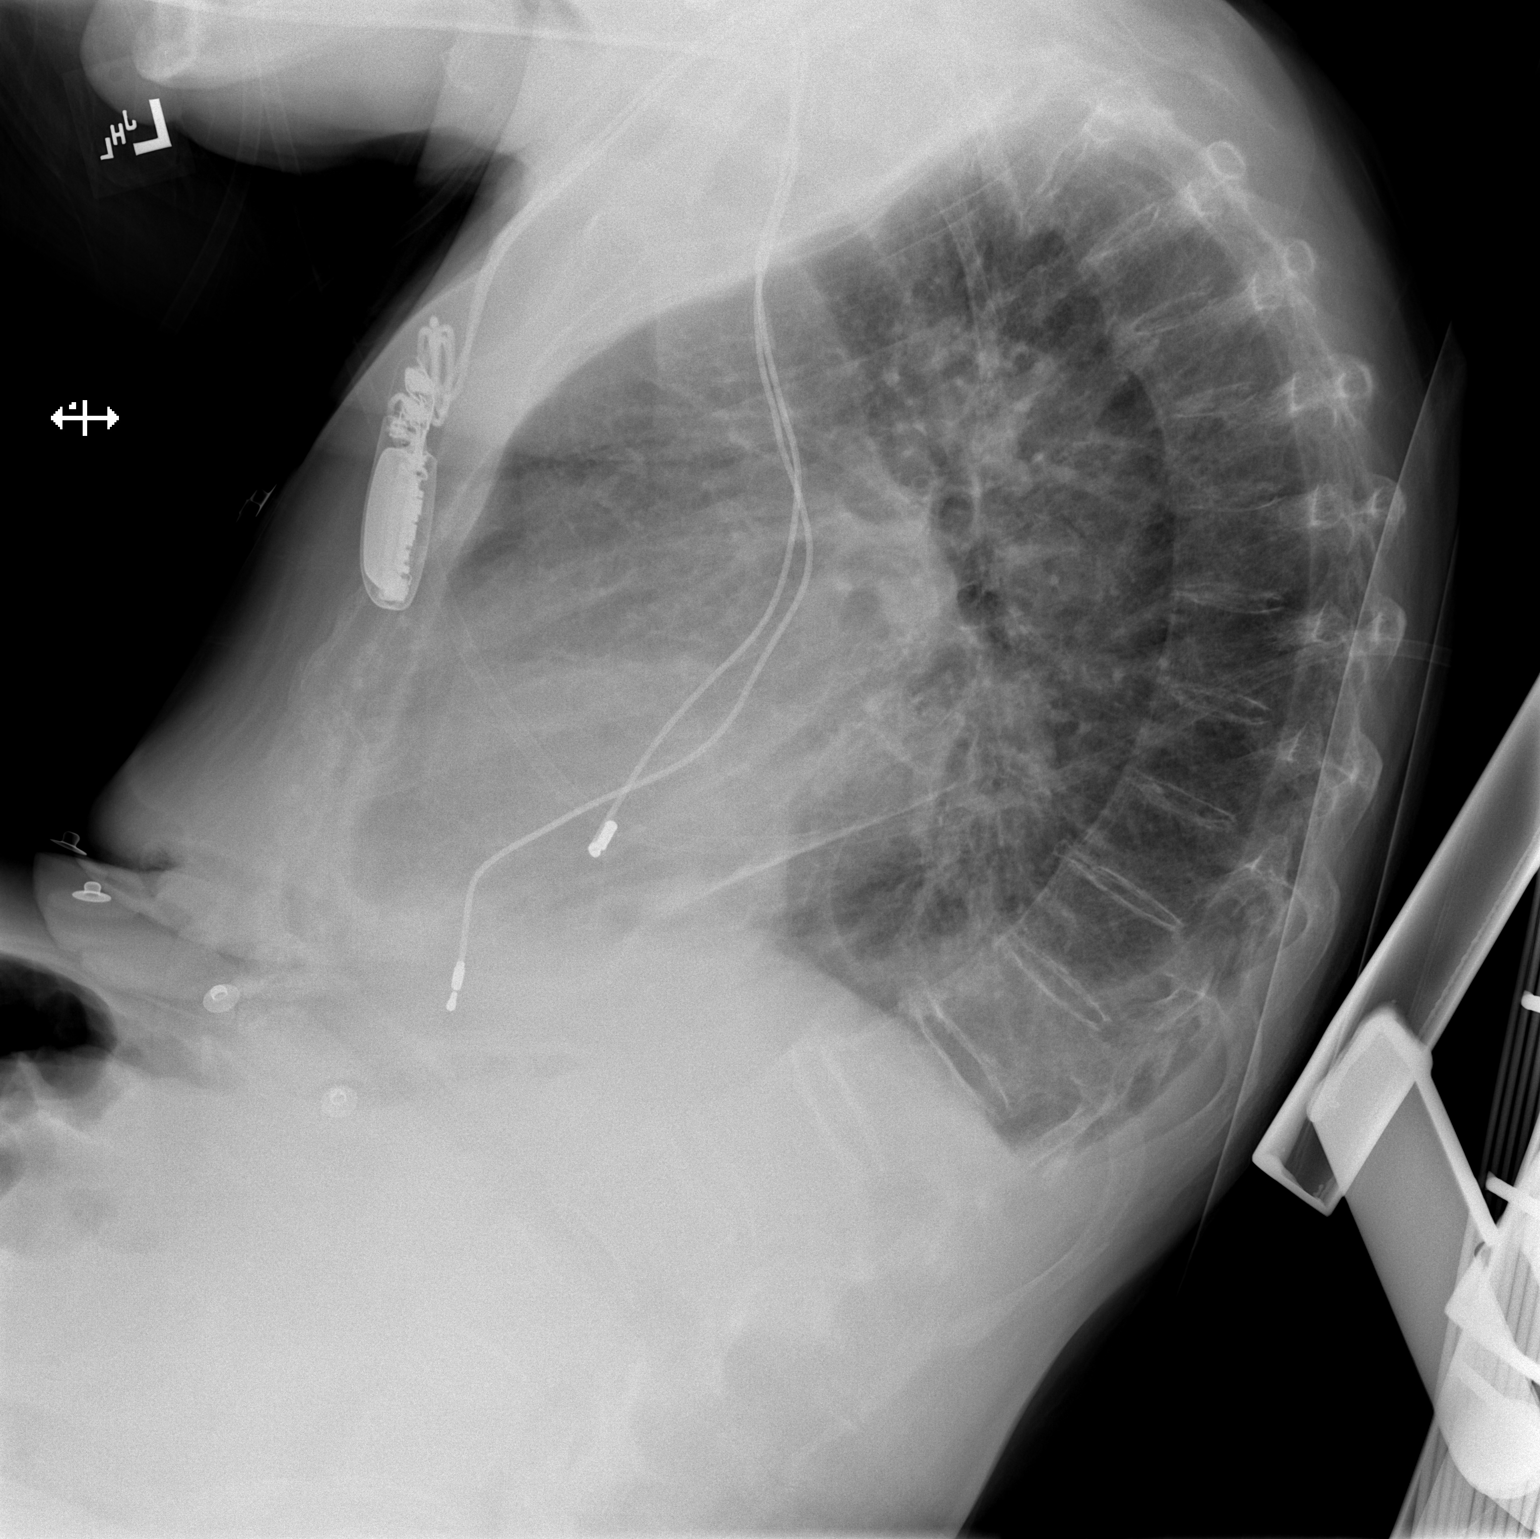

[x chest ap]
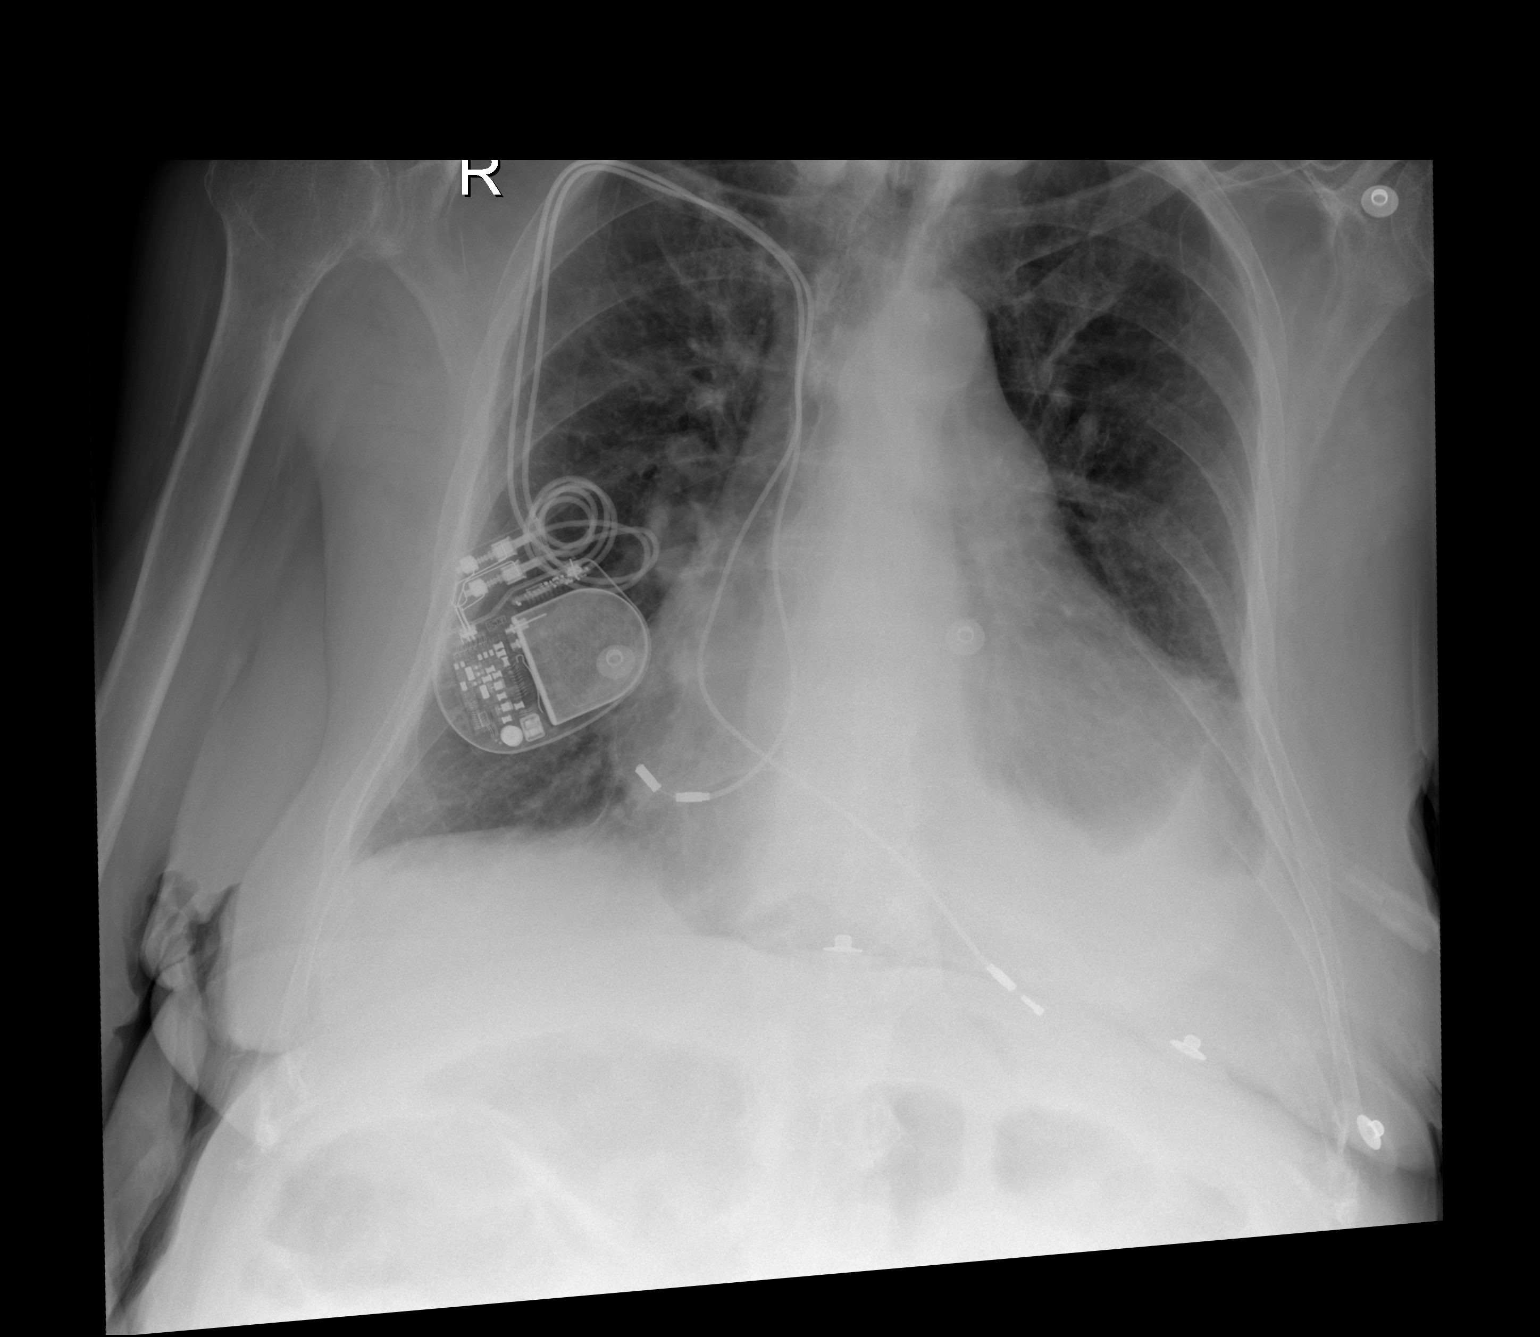

[2 of 2 positions shown; findings below may reference images not displayed]

FINDINGS: Small left pleural effusion and left lung base atelectasis or
infiltrate. The right lung is clear. No pneumothorax. There is
cardiomegaly with mild central vascular congestion. Right pectoral
pacemaker device. No acute osseous pathology.
IMPRESSION: Small left pleural effusion and left lung base atelectasis or
infiltrate.
